# Patient Record
Sex: Female | Born: 1935 | Race: White | Hispanic: No | Smoking: Former smoker
Health system: Southern US, Community
[De-identification: ages and names within clinical notes are randomized; demographics above are authoritative.]

## PROBLEM LIST (undated history)

## (undated) DIAGNOSIS — I10 Essential (primary) hypertension: Secondary | ICD-10-CM

## (undated) DIAGNOSIS — M810 Age-related osteoporosis without current pathological fracture: Secondary | ICD-10-CM

## (undated) DIAGNOSIS — I639 Cerebral infarction, unspecified: Secondary | ICD-10-CM

## (undated) DIAGNOSIS — E7800 Pure hypercholesterolemia, unspecified: Secondary | ICD-10-CM

## (undated) DIAGNOSIS — M797 Fibromyalgia: Secondary | ICD-10-CM

## (undated) DIAGNOSIS — C44722 Squamous cell carcinoma of skin of right lower limb, including hip: Secondary | ICD-10-CM

## (undated) DIAGNOSIS — K219 Gastro-esophageal reflux disease without esophagitis: Secondary | ICD-10-CM

## (undated) DIAGNOSIS — Z9289 Personal history of other medical treatment: Secondary | ICD-10-CM

## (undated) DIAGNOSIS — I209 Angina pectoris, unspecified: Secondary | ICD-10-CM

## (undated) DIAGNOSIS — W109XXA Fall (on) (from) unspecified stairs and steps, initial encounter: Secondary | ICD-10-CM

## (undated) DIAGNOSIS — Z9889 Other specified postprocedural states: Secondary | ICD-10-CM

## (undated) DIAGNOSIS — M545 Low back pain, unspecified: Secondary | ICD-10-CM

## (undated) DIAGNOSIS — H353290 Exudative age-related macular degeneration, unspecified eye, stage unspecified: Secondary | ICD-10-CM

## (undated) DIAGNOSIS — I4891 Unspecified atrial fibrillation: Secondary | ICD-10-CM

## (undated) DIAGNOSIS — Z8719 Personal history of other diseases of the digestive system: Secondary | ICD-10-CM

## (undated) DIAGNOSIS — M1990 Unspecified osteoarthritis, unspecified site: Secondary | ICD-10-CM

## (undated) DIAGNOSIS — T4145XA Adverse effect of unspecified anesthetic, initial encounter: Secondary | ICD-10-CM

## (undated) DIAGNOSIS — I739 Peripheral vascular disease, unspecified: Secondary | ICD-10-CM

## (undated) DIAGNOSIS — K922 Gastrointestinal hemorrhage, unspecified: Secondary | ICD-10-CM

## (undated) DIAGNOSIS — G8929 Other chronic pain: Secondary | ICD-10-CM

## (undated) DIAGNOSIS — T8859XA Other complications of anesthesia, initial encounter: Secondary | ICD-10-CM

## (undated) DIAGNOSIS — D649 Anemia, unspecified: Secondary | ICD-10-CM

## (undated) DIAGNOSIS — I82409 Acute embolism and thrombosis of unspecified deep veins of unspecified lower extremity: Secondary | ICD-10-CM

## (undated) DIAGNOSIS — R112 Nausea with vomiting, unspecified: Secondary | ICD-10-CM

## (undated) DIAGNOSIS — G459 Transient cerebral ischemic attack, unspecified: Secondary | ICD-10-CM

## (undated) HISTORY — PX: CARPAL TUNNEL RELEASE: SHX101

## (undated) HISTORY — PX: TUBAL LIGATION: SHX77

## (undated) HISTORY — DX: Peripheral vascular disease, unspecified: I73.9

## (undated) HISTORY — DX: Acute embolism and thrombosis of unspecified deep veins of unspecified lower extremity: I82.409

## (undated) HISTORY — PX: COLONOSCOPY: SHX174

## (undated) HISTORY — PX: ORIF SHOULDER FRACTURE: SHX5035

## (undated) HISTORY — DX: Essential (primary) hypertension: I10

## (undated) HISTORY — PX: JOINT REPLACEMENT: SHX530

## (undated) HISTORY — DX: Cerebral infarction, unspecified: I63.9

## (undated) HISTORY — PX: VAGINAL HYSTERECTOMY: SUR661

## (undated) HISTORY — PX: EYE SURGERY: SHX253

## (undated) HISTORY — PX: CATARACT EXTRACTION W/ INTRAOCULAR LENS  IMPLANT, BILATERAL: SHX1307

## (undated) HISTORY — PX: DILATION AND CURETTAGE OF UTERUS: SHX78

---

## 1999-01-22 ENCOUNTER — Encounter: Payer: Self-pay | Admitting: Internal Medicine

## 1999-01-22 ENCOUNTER — Ambulatory Visit (HOSPITAL_COMMUNITY): Admission: RE | Admit: 1999-01-22 | Discharge: 1999-01-22 | Payer: Self-pay | Admitting: Internal Medicine

## 1999-08-07 ENCOUNTER — Emergency Department (HOSPITAL_COMMUNITY): Admission: EM | Admit: 1999-08-07 | Discharge: 1999-08-07 | Payer: Self-pay | Admitting: Emergency Medicine

## 2000-04-09 ENCOUNTER — Emergency Department (HOSPITAL_COMMUNITY): Admission: EM | Admit: 2000-04-09 | Discharge: 2000-04-09 | Payer: Self-pay | Admitting: Emergency Medicine

## 2000-04-09 ENCOUNTER — Encounter: Payer: Self-pay | Admitting: Emergency Medicine

## 2000-05-05 ENCOUNTER — Encounter: Admission: RE | Admit: 2000-05-05 | Discharge: 2000-06-02 | Payer: Self-pay | Admitting: Orthopedic Surgery

## 2000-10-05 ENCOUNTER — Ambulatory Visit (HOSPITAL_COMMUNITY): Admission: RE | Admit: 2000-10-05 | Discharge: 2000-10-05 | Payer: Self-pay | Admitting: Gastroenterology

## 2000-10-05 ENCOUNTER — Encounter (INDEPENDENT_AMBULATORY_CARE_PROVIDER_SITE_OTHER): Payer: Self-pay

## 2002-08-31 ENCOUNTER — Encounter
Admission: RE | Admit: 2002-08-31 | Discharge: 2002-10-10 | Payer: Self-pay | Admitting: Physical Medicine and Rehabilitation

## 2002-10-14 ENCOUNTER — Encounter: Payer: Self-pay | Admitting: Internal Medicine

## 2002-10-14 ENCOUNTER — Encounter: Admission: RE | Admit: 2002-10-14 | Discharge: 2002-10-14 | Payer: Self-pay | Admitting: Internal Medicine

## 2003-04-10 ENCOUNTER — Inpatient Hospital Stay (HOSPITAL_COMMUNITY): Admission: EM | Admit: 2003-04-10 | Discharge: 2003-04-21 | Payer: Self-pay | Admitting: *Deleted

## 2003-04-10 ENCOUNTER — Encounter: Payer: Self-pay | Admitting: *Deleted

## 2003-04-13 ENCOUNTER — Encounter: Payer: Self-pay | Admitting: Orthopedic Surgery

## 2003-08-07 ENCOUNTER — Other Ambulatory Visit: Admission: RE | Admit: 2003-08-07 | Discharge: 2003-08-07 | Payer: Self-pay | Admitting: Obstetrics and Gynecology

## 2003-08-31 ENCOUNTER — Encounter: Payer: Self-pay | Admitting: *Deleted

## 2003-08-31 ENCOUNTER — Encounter: Admission: RE | Admit: 2003-08-31 | Discharge: 2003-08-31 | Payer: Self-pay | Admitting: *Deleted

## 2004-06-19 ENCOUNTER — Encounter: Admission: RE | Admit: 2004-06-19 | Discharge: 2004-06-19 | Payer: Self-pay | Admitting: Orthopedic Surgery

## 2004-06-20 ENCOUNTER — Ambulatory Visit (HOSPITAL_COMMUNITY): Admission: RE | Admit: 2004-06-20 | Discharge: 2004-06-20 | Payer: Self-pay | Admitting: Orthopedic Surgery

## 2004-06-20 ENCOUNTER — Ambulatory Visit (HOSPITAL_BASED_OUTPATIENT_CLINIC_OR_DEPARTMENT_OTHER): Admission: RE | Admit: 2004-06-20 | Discharge: 2004-06-20 | Payer: Self-pay | Admitting: Orthopedic Surgery

## 2004-12-31 ENCOUNTER — Other Ambulatory Visit: Admission: RE | Admit: 2004-12-31 | Discharge: 2004-12-31 | Payer: Self-pay | Admitting: Obstetrics and Gynecology

## 2006-06-17 HISTORY — PX: TOTAL KNEE ARTHROPLASTY: SHX125

## 2006-07-10 ENCOUNTER — Inpatient Hospital Stay (HOSPITAL_COMMUNITY): Admission: RE | Admit: 2006-07-10 | Discharge: 2006-07-14 | Payer: Self-pay | Admitting: Specialist

## 2007-12-03 ENCOUNTER — Encounter: Admission: RE | Admit: 2007-12-03 | Discharge: 2007-12-03 | Payer: Self-pay | Admitting: Internal Medicine

## 2008-01-21 ENCOUNTER — Encounter: Admission: RE | Admit: 2008-01-21 | Discharge: 2008-01-21 | Payer: Self-pay | Admitting: Gastroenterology

## 2008-06-24 ENCOUNTER — Emergency Department (HOSPITAL_COMMUNITY): Admission: EM | Admit: 2008-06-24 | Discharge: 2008-06-24 | Payer: Self-pay | Admitting: Emergency Medicine

## 2009-02-11 ENCOUNTER — Encounter (INDEPENDENT_AMBULATORY_CARE_PROVIDER_SITE_OTHER): Payer: Self-pay | Admitting: Internal Medicine

## 2009-02-11 ENCOUNTER — Inpatient Hospital Stay (HOSPITAL_COMMUNITY): Admission: EM | Admit: 2009-02-11 | Discharge: 2009-02-13 | Payer: Self-pay | Admitting: Emergency Medicine

## 2009-02-12 ENCOUNTER — Encounter (INDEPENDENT_AMBULATORY_CARE_PROVIDER_SITE_OTHER): Payer: Self-pay | Admitting: Internal Medicine

## 2009-02-12 ENCOUNTER — Ambulatory Visit: Payer: Self-pay | Admitting: Surgery

## 2009-09-06 ENCOUNTER — Inpatient Hospital Stay (HOSPITAL_COMMUNITY): Admission: EM | Admit: 2009-09-06 | Discharge: 2009-09-18 | Payer: Self-pay | Admitting: Emergency Medicine

## 2009-09-06 ENCOUNTER — Encounter: Admission: RE | Admit: 2009-09-06 | Discharge: 2009-09-06 | Payer: Self-pay | Admitting: Internal Medicine

## 2009-09-07 ENCOUNTER — Ambulatory Visit: Payer: Self-pay | Admitting: Surgery

## 2009-09-07 ENCOUNTER — Encounter (INDEPENDENT_AMBULATORY_CARE_PROVIDER_SITE_OTHER): Payer: Self-pay | Admitting: Internal Medicine

## 2009-09-10 ENCOUNTER — Ambulatory Visit: Payer: Self-pay | Admitting: Surgery

## 2009-09-10 ENCOUNTER — Encounter: Payer: Self-pay | Admitting: Surgery

## 2009-09-13 HISTORY — PX: PR VEIN BYPASS GRAFT,AORTO-FEM-POP: 35551

## 2009-10-01 ENCOUNTER — Ambulatory Visit: Payer: Self-pay | Admitting: Surgery

## 2009-10-15 ENCOUNTER — Ambulatory Visit: Payer: Self-pay | Admitting: Surgery

## 2009-10-17 DIAGNOSIS — I82409 Acute embolism and thrombosis of unspecified deep veins of unspecified lower extremity: Secondary | ICD-10-CM

## 2009-10-17 HISTORY — DX: Acute embolism and thrombosis of unspecified deep veins of unspecified lower extremity: I82.409

## 2009-10-24 ENCOUNTER — Ambulatory Visit: Payer: Self-pay | Admitting: Vascular Surgery

## 2009-10-25 ENCOUNTER — Inpatient Hospital Stay (HOSPITAL_COMMUNITY): Admission: RE | Admit: 2009-10-25 | Discharge: 2009-10-30 | Payer: Self-pay | Admitting: Vascular Surgery

## 2009-10-25 ENCOUNTER — Ambulatory Visit: Payer: Self-pay | Admitting: Vascular Surgery

## 2009-10-25 HISTORY — PX: INCISION AND DRAINAGE OF WOUND: SHX1803

## 2009-11-15 ENCOUNTER — Ambulatory Visit: Payer: Self-pay | Admitting: Vascular Surgery

## 2009-11-26 ENCOUNTER — Ambulatory Visit: Payer: Self-pay | Admitting: Surgery

## 2009-12-17 ENCOUNTER — Ambulatory Visit: Payer: Self-pay | Admitting: Surgery

## 2009-12-25 ENCOUNTER — Ambulatory Visit: Payer: Self-pay | Admitting: Vascular Surgery

## 2010-01-07 ENCOUNTER — Ambulatory Visit: Payer: Self-pay | Admitting: Surgery

## 2010-02-04 ENCOUNTER — Ambulatory Visit: Payer: Self-pay | Admitting: Surgery

## 2010-02-18 ENCOUNTER — Ambulatory Visit: Payer: Self-pay | Admitting: Surgery

## 2010-04-18 ENCOUNTER — Ambulatory Visit: Payer: Self-pay | Admitting: Vascular Surgery

## 2010-04-19 ENCOUNTER — Ambulatory Visit: Payer: Self-pay | Admitting: Vascular Surgery

## 2010-04-19 ENCOUNTER — Ambulatory Visit (HOSPITAL_COMMUNITY): Admission: RE | Admit: 2010-04-19 | Discharge: 2010-04-19 | Payer: Self-pay | Admitting: Vascular Surgery

## 2010-04-25 ENCOUNTER — Ambulatory Visit (HOSPITAL_COMMUNITY): Admission: RE | Admit: 2010-04-25 | Discharge: 2010-04-25 | Payer: Self-pay | Admitting: Surgery

## 2010-05-27 ENCOUNTER — Ambulatory Visit: Payer: Self-pay | Admitting: Surgery

## 2010-09-02 ENCOUNTER — Ambulatory Visit: Payer: Self-pay | Admitting: Surgery

## 2010-10-18 ENCOUNTER — Emergency Department (HOSPITAL_BASED_OUTPATIENT_CLINIC_OR_DEPARTMENT_OTHER)
Admission: EM | Admit: 2010-10-18 | Discharge: 2010-10-18 | Payer: Self-pay | Source: Home / Self Care | Admitting: Emergency Medicine

## 2010-11-25 ENCOUNTER — Ambulatory Visit
Admission: RE | Admit: 2010-11-25 | Discharge: 2010-11-25 | Payer: Self-pay | Source: Home / Self Care | Attending: Surgery | Admitting: Surgery

## 2010-12-02 ENCOUNTER — Ambulatory Visit: Admit: 2010-12-02 | Payer: Self-pay | Admitting: Surgery

## 2010-12-08 ENCOUNTER — Encounter: Payer: Self-pay | Admitting: Gastroenterology

## 2010-12-08 ENCOUNTER — Encounter: Payer: Self-pay | Admitting: Sports Medicine

## 2010-12-11 ENCOUNTER — Ambulatory Visit (HOSPITAL_COMMUNITY)
Admission: RE | Admit: 2010-12-11 | Discharge: 2010-12-11 | Payer: Self-pay | Source: Home / Self Care | Attending: Surgery | Admitting: Surgery

## 2010-12-11 HISTORY — PX: FEMORAL ARTERY STENT: SHX1583

## 2010-12-12 LAB — POCT I-STAT, CHEM 8
Calcium, Ion: 1.24 mmol/L (ref 1.12–1.32)
HCT: 38 % (ref 36.0–46.0)

## 2010-12-12 LAB — POCT ACTIVATED CLOTTING TIME
Activated Clotting Time: 176 seconds
Activated Clotting Time: 146 seconds

## 2010-12-29 NOTE — Op Note (Addendum)
NAME:  [name], [name]              ACCOUNT NO.:  [id number]  MEDICAL RECORD NO.:  [id number]          PATIENT TYPE:  AMB  LOCATION:  SDS                          FACILITY:  [facility]  PHYSICIAN:  VDurene Cal IV, MDDATE OF BIRTH:  1928-05-29  DATE OF PROCEDURE:  12/11/2010 DATE OF DISCHARGE:  12/11/2010                              OPERATIVE REPORT   PREOPERATIVE DIAGNOSIS:  In-stent stenosis.  POSTOPERATIVE DIAGNOSIS:  In-stent stenosis.  PROCEDURE PERFORMED: 1. Ultrasound access right femoral artery. 2. Left leg runoff. 3. Stent in left superficial femoral artery.  INDICATIONS:  Ms. Flippo is an 77 year old female who underwent emergent bypass for ischemic left leg.  She developed stenosis at her proximal anastomosis which was the above-knee popliteal artery.  This was stented.  She developed recurrent symptoms and ultrasound velocities consistent with high-grade stenosis.  PROCEDURE:  The patient was identified in the holding area and taken to room 8, placed supine on table.  Right groin was prepped and draped in usual fashion.  A time-out was called.  The right femoral artery was divided with ultrasound and found to be widely patent, easily compressible.  A digital image was acquired, it was accessed under ultrasound guidance after anesthetizing the with 1% lidocaine.  A 0.035 wire was advanced into the aorta under fluoroscopic visualization.  A 5- French sheath was placed.  An Omni flush catheter was then inserted, and the aortic bifurcation was crossed using the Omni flush catheter.  A Bentson wire was placed in the left external iliac artery and left leg runoff was obtained.  FINDINGS:  The left common femoral artery is widely patent.  The left profunda femoral artery is widely patent.  The left superficial femoral artery is patent.  In its proximal portion, there is an area of stenosis of approximately 40% to 50%.  There was a second lesion approximately 20 cm  distal to its origin that is about 30% to 40% stenosed.  There is a high-grade stenosis within the proximal portion of the popliteal stent. The bypass graft is widely patent with three-vessel runoff.  At this point in time, decision was made to intervene.  Over a Versacore wire, the Omni flush catheter was removed and a 6-French Ansel-1 sheath was inserted.  The patient was given systemic heparinization.  The Versacore wire was advanced out resistance across area of stenosis.  I elected to primarily treat this lesion with a stent.  An EV3 6 x 30 self- expanding stent was deployed, covering the in-stent stenosis which extended up to the proximal portion of the stent.  This area was then molded to confirmation with a 5-mm balloon.  Completion arteriogram was performed which revealed resolution of the in-stent stenosis with widely patent bypass and runoff.  At this point in time, decision was made to terminate the procedure.  Catheters and wires were removed.  The patient was taken to the holding area for sheath pull.  The patient tolerated the procedure well.  IMPRESSION: 1. High-grade in-stent stenosis, left popliteal artery successfully     treated using an EV3 6 x 30 balloon expandable stent. 2. Widely patent above  to below-knee popliteal bypass graft with three-     vessel runoff graft.     Jorge Ny, MD     VWB/MEDQ  D:  12/11/2010  T:  12/12/2010  Job:  [id number]  Electronically Signed by Arelia Longest IV MD on 12/29/2010 09:58:41 PM

## 2011-01-13 ENCOUNTER — Ambulatory Visit (INDEPENDENT_AMBULATORY_CARE_PROVIDER_SITE_OTHER): Payer: Medicare Other | Admitting: Surgery

## 2011-01-13 ENCOUNTER — Encounter (INDEPENDENT_AMBULATORY_CARE_PROVIDER_SITE_OTHER): Payer: Medicare Other

## 2011-01-13 DIAGNOSIS — I70219 Atherosclerosis of native arteries of extremities with intermittent claudication, unspecified extremity: Secondary | ICD-10-CM

## 2011-01-13 DIAGNOSIS — Z48812 Encounter for surgical aftercare following surgery on the circulatory system: Secondary | ICD-10-CM

## 2011-01-13 DIAGNOSIS — I739 Peripheral vascular disease, unspecified: Secondary | ICD-10-CM

## 2011-01-14 NOTE — Assessment & Plan Note (Signed)
OFFICE VISIT  Doddridge, Makendra L DOB:  [birth year]                                       01/13/2011 ZOXWR#:[id number]  The patient is status post left superficial femoral to below knee popliteal bypass graft with vein on 09/13/2009 for an ischemic leg.  She had a nonhealing wound in her distal incision which was debrided by Dr. Darrick Penna on 10/25/2009.  She had wound healing complications secondary to venous hypertension and the edema in her leg.  It did improve with an Radio broadcast assistant.  She developed a high-grade stenosis in the popliteal artery above the knee at the proximal anastomosis.  This was stented in 2011. Surveillance ultrasound detected an elevated velocity, as well as a drop in her ABI from 1 to 0.8.  She was asymptomatic at that time.  She underwent arteriogram on 12/11/2010.  High-grade in-stent stenosis was detected and restented using a 6 x 30 EV3 stent.  She comes back in today for followup.  She is without complaints.  PHYSICAL EXAMINATION:  Vital signs:  Heart rate 62, blood pressure 162/81, O2 sat 99%.  General:  She is well-appearing, in no distress. Extremities:  Warm and well-perfused.  There was minimal edema bilaterally.  She has a palpable pedal pulse.  DIAGNOSTIC STUDIES:  Ultrasound was performed today.  Her ABIs are 1.0 bilaterally.  ASSESSMENT AND PLAN:  Status post restenting of the proximal anastomosis of her above knee to below knee bypass on the left.  The patient is doing very well at this time.  I have refilled her compression stockings.  This is a 15-20 grade compression.  I gave her thigh high prescription today because she was having some problem with swelling above at the top of her knee high socks.  She can be placed back on our surveillance ultrasound.  She will have ultrasounds every 3 months for the first year.  I will plan on seeing her back in 1 year as long as her ultrasounds are without abnormalities.    Jorge Ny, MD Electronically Signed  VWB/MEDQ  D:  01/13/2011  T:  01/14/2011  Job:  [id number]  cc:   Candyce Churn, M.D.

## 2011-02-03 LAB — POCT I-STAT, CHEM 8
BUN: 23 mg/dL (ref 6–23)
Chloride: 106 mEq/L (ref 96–112)
Chloride: 109 mEq/L (ref 96–112)
Creatinine, Ser: 1.1 mg/dL (ref 0.4–1.2)
Creatinine, Ser: 1.3 mg/dL — ABNORMAL HIGH (ref 0.4–1.2)
Glucose, Bld: 92 mg/dL (ref 70–99)
HCT: 39 % (ref 36.0–46.0)
HCT: 39 % (ref 36.0–46.0)
Hemoglobin: 13.3 g/dL (ref 12.0–15.0)
Potassium: 3.8 mEq/L (ref 3.5–5.1)
Sodium: 139 mEq/L (ref 135–145)
TCO2: 22 mmol/L (ref 0–100)

## 2011-02-18 LAB — BASIC METABOLIC PANEL
BUN: 13 mg/dL (ref 6–23)
CO2: 28 mEq/L (ref 19–32)
Chloride: 104 mEq/L (ref 96–112)
Creatinine, Ser: 1.25 mg/dL — ABNORMAL HIGH (ref 0.4–1.2)
GFR calc Af Amer: 50 mL/min — ABNORMAL LOW (ref >60)
GFR calc non Af Amer: 53 mL/min — ABNORMAL LOW (ref >60)
Potassium: 3.9 mEq/L (ref 3.5–5.1)

## 2011-02-18 LAB — WOUND CULTURE

## 2011-02-18 LAB — POCT I-STAT 4, (NA,K, GLUC, HGB,HCT)
Glucose, Bld: 107 mg/dL — ABNORMAL HIGH (ref 70–99)
HCT: 35 % — ABNORMAL LOW (ref 36.0–46.0)
Hemoglobin: 11.9 g/dL — ABNORMAL LOW (ref 12.0–15.0)
Potassium: 4 mEq/L (ref 3.5–5.1)

## 2011-02-18 LAB — CBC
Hemoglobin: 11 g/dL — ABNORMAL LOW (ref 12.0–15.0)
MCV: 92.5 fL (ref 78.0–100.0)

## 2011-02-20 LAB — BASIC METABOLIC PANEL
BUN: 16 mg/dL (ref 6–23)
CO2: 24 mEq/L (ref 19–32)
CO2: 24 mEq/L (ref 19–32)
Calcium: 8.7 mg/dL (ref 8.4–10.5)
Chloride: 103 mEq/L (ref 96–112)
Chloride: 103 mEq/L (ref 96–112)
Creatinine, Ser: 1.37 mg/dL — ABNORMAL HIGH (ref 0.4–1.2)
GFR calc Af Amer: 43 mL/min — ABNORMAL LOW (ref >60)
GFR calc Af Amer: 45 mL/min — ABNORMAL LOW (ref >60)
GFR calc Af Amer: 50 mL/min — ABNORMAL LOW (ref >60)
GFR calc Af Amer: 54 mL/min — ABNORMAL LOW (ref >60)
GFR calc non Af Amer: 36 mL/min — ABNORMAL LOW (ref >60)
GFR calc non Af Amer: 44 mL/min — ABNORMAL LOW (ref >60)
Glucose, Bld: 92 mg/dL (ref 70–99)
Potassium: 4 mEq/L (ref 3.5–5.1)
Potassium: 4.5 mEq/L (ref 3.5–5.1)
Sodium: 134 mEq/L — ABNORMAL LOW (ref 135–145)

## 2011-02-20 LAB — CROSSMATCH

## 2011-02-20 LAB — CBC
HCT: 24.4 % — ABNORMAL LOW (ref 36.0–46.0)
HCT: 26.4 % — ABNORMAL LOW (ref 36.0–46.0)
HCT: 31.5 % — ABNORMAL LOW (ref 36.0–46.0)
Hemoglobin: 10 g/dL — ABNORMAL LOW (ref 12.0–15.0)
Hemoglobin: 10.8 g/dL — ABNORMAL LOW (ref 12.0–15.0)
Hemoglobin: 9 g/dL — ABNORMAL LOW (ref 12.0–15.0)
MCHC: 34.4 g/dL (ref 30.0–36.0)
MCHC: 35.4 g/dL (ref 30.0–36.0)
MCV: 90.1 fL (ref 78.0–100.0)
MCV: 90.7 fL (ref 78.0–100.0)
MCV: 90.7 fL (ref 78.0–100.0)
MCV: 91.6 fL (ref 78.0–100.0)
Platelets: 186 10*3/uL (ref 150–400)
RBC: 2.89 MIL/uL — ABNORMAL LOW (ref 3.87–5.11)
RBC: 3.15 MIL/uL — ABNORMAL LOW (ref 3.87–5.11)
RBC: 3.46 MIL/uL — ABNORMAL LOW (ref 3.87–5.11)
RDW: 13.7 % (ref 11.5–15.5)
RDW: 14.2 % (ref 11.5–15.5)
WBC: 6.9 10*3/uL (ref 4.0–10.5)
WBC: 8.7 10*3/uL (ref 4.0–10.5)

## 2011-02-20 LAB — PROTIME-INR
INR: 0.96 (ref 0.00–1.49)
INR: 0.97 (ref 0.00–1.49)

## 2011-02-20 LAB — POCT CARDIAC MARKERS
CKMB, poc: 3 ng/mL (ref 1.0–8.0)
CKMB, poc: 3.6 ng/mL (ref 1.0–8.0)
Troponin i, poc: <0.05 ng/mL (ref 0.00–0.09)

## 2011-02-20 LAB — DIFFERENTIAL
Basophils Relative: 1 % (ref 0–1)
Lymphocytes Relative: 27 % (ref 12–46)
Lymphs Abs: 1.8 10*3/uL (ref 0.7–4.0)
Monocytes Absolute: 0.6 10*3/uL (ref 0.1–1.0)
Monocytes Relative: 10 % (ref 3–12)
Neutro Abs: 3.9 10*3/uL (ref 1.7–7.7)
Neutrophils Relative %: 60 % (ref 43–77)

## 2011-02-20 LAB — TROPONIN I: Troponin I: 0.01 ng/mL (ref 0.00–0.06)

## 2011-02-20 LAB — URINALYSIS, ROUTINE W REFLEX MICROSCOPIC
Bilirubin Urine: NEGATIVE
Glucose, UA: NEGATIVE mg/dL
Ketones, ur: NEGATIVE mg/dL
Protein, ur: NEGATIVE mg/dL
Specific Gravity, Urine: 1.012 (ref 1.005–1.030)
Urobilinogen, UA: 0.2 mg/dL (ref 0.0–1.0)
pH: 5.5 (ref 5.0–8.0)

## 2011-02-20 LAB — URINE MICROSCOPIC-ADD ON

## 2011-02-20 LAB — CK TOTAL AND CKMB (NOT AT ARMC)
CK, MB: 2.7 ng/mL (ref 0.3–4.0)
CK, MB: 2.9 ng/mL (ref 0.3–4.0)
CK, MB: 3.5 ng/mL (ref 0.3–4.0)
Relative Index: 2 (ref 0.0–2.5)
Relative Index: 2.4 (ref 0.0–2.5)

## 2011-02-20 LAB — APTT: aPTT: 26 seconds (ref 24–37)

## 2011-02-27 LAB — COMPREHENSIVE METABOLIC PANEL
ALT: 12 U/L (ref 0–35)
BUN: 22 mg/dL (ref 6–23)
CO2: 24 mEq/L (ref 19–32)
CO2: 26 mEq/L (ref 19–32)
Calcium: 9.1 mg/dL (ref 8.4–10.5)
Calcium: 9.2 mg/dL (ref 8.4–10.5)
Chloride: 106 mEq/L (ref 96–112)
Creatinine, Ser: 1.21 mg/dL — ABNORMAL HIGH (ref 0.4–1.2)
Creatinine, Ser: 1.26 mg/dL — ABNORMAL HIGH (ref 0.4–1.2)
GFR calc non Af Amer: 41 mL/min — ABNORMAL LOW (ref >60)
GFR calc non Af Amer: 43 mL/min — ABNORMAL LOW (ref >60)
Glucose, Bld: 85 mg/dL (ref 70–99)
Glucose, Bld: 96 mg/dL (ref 70–99)
Sodium: 139 mEq/L (ref 135–145)
Total Bilirubin: 0.7 mg/dL (ref 0.3–1.2)

## 2011-02-27 LAB — DIFFERENTIAL
Lymphocytes Relative: 28 % (ref 12–46)
Lymphs Abs: 2.4 10*3/uL (ref 0.7–4.0)
Neutrophils Relative %: 59 % (ref 43–77)

## 2011-02-27 LAB — CBC
Hemoglobin: 12.3 g/dL (ref 12.0–15.0)
MCHC: 34.1 g/dL (ref 30.0–36.0)
MCHC: 34.3 g/dL (ref 30.0–36.0)
MCV: 90.2 fL (ref 78.0–100.0)
MCV: 90.3 fL (ref 78.0–100.0)
Platelets: 211 10*3/uL (ref 150–400)
RBC: 4 MIL/uL (ref 3.87–5.11)
WBC: 8.5 10*3/uL (ref 4.0–10.5)

## 2011-02-27 LAB — GLUCOSE, CAPILLARY: Glucose-Capillary: 100 mg/dL — ABNORMAL HIGH (ref 70–99)

## 2011-02-27 LAB — TROPONIN I: Troponin I: <0.01 ng/mL (ref 0.00–0.06)

## 2011-02-27 LAB — HEMOGLOBIN A1C: Hgb A1c MFr Bld: 5.6 % (ref 4.6–6.1)

## 2011-02-27 LAB — LIPID PANEL
Cholesterol: 183 mg/dL (ref 0–200)
Total CHOL/HDL Ratio: 6.5 RATIO

## 2011-02-27 LAB — CK TOTAL AND CKMB (NOT AT ARMC): Total CK: 182 U/L — ABNORMAL HIGH (ref 7–177)

## 2011-02-27 LAB — PROTIME-INR: Prothrombin Time: 13.4 seconds (ref 11.6–15.2)

## 2011-04-01 NOTE — Procedures (Signed)
BYPASS GRAFT EVALUATION   INDICATION:  Follow up left lower extremity stent and bypass graft.   HISTORY:  Diabetes:  No.  Cardiac:  No.  Hypertension:  Yes.  Smoking:  Previous.  Previous Surgery:  Left distal superficial femoral to below-knee  popliteal artery bypass graft on 09/13/2009, left superficial femoral  artery stent on 04/25/2010.   SINGLE LEVEL ARTERIAL EXAM                               RIGHT              LEFT  Brachial:                    169                174  Anterior tibial:             184                172  Posterior tibial:            163                175  Peroneal:  Ankle/brachial index:        1.06               1.01   PREVIOUS ABI:  Date: 05/27/2010  RIGHT:  0.98  LEFT:  1.12   LOWER EXTREMITY BYPASS GRAFT DUPLEX EXAM:   DUPLEX:  Biphasic Doppler waveforms noted throughout the left lower  extremity bypass graft in its native vessels with no increase in  velocities.   IMPRESSION:  1. Patent left superficial femoral artery stent and superficial      femoral to popliteal artery bypass graft with no evidence of      stenosis.  2. Stable bilateral ankle brachial indices.   ___________________________________________  V. Charlena Cross, MD   CH/MEDQ  D:  09/03/2010  T:  09/03/2010  Job:  [id number]

## 2011-04-01 NOTE — Assessment & Plan Note (Signed)
OFFICE VISIT   [name], [name]  DOB:  [birth year]                                       02/18/2010  JXBJY#:[id number]   REASON FOR VISIT:  Follow-up.   HISTORY:  This is a 77 year old female who underwent left superficial  femoral to below knee popliteal bypass graft with reversed greater  saphenous vein on September 13, 2009 for an ischemic leg.  She developed a  nonhealing wound at her distal incision which was debrided in the  operating by Dr. Darrick Penna on December 9.  We have been dealing with  getting this area to heal since that time, the patient has significant  venous hypertension and that, I believe, is the etiology to her slowly  healing wounds.  When I last saw her in March I put her in Unna boots  and she comes back in today for follow-up.   PHYSICAL EXAMINATION:  She is afebrile and vital signs are stable.  Cardiovascular:  Regular rate and rhythm.  Respirations are nonlabored.  The Unna boot was removed today and the wound is completely healed.  She  has palpable pedal pulse.   PLAN:  Now that the wound is healed, I am recommending that she go into  compression stockings to prevent further wound issues in both lower  extremities.  I am going to have her come back for ultrasound in August  and I will see her at that time.     Jorge Ny, MD  Electronically Signed   VWB/MEDQ  D:  02/18/2010  T:  02/19/2010  Job:  [id number]   cc:   Candyce Churn, M.D.

## 2011-04-01 NOTE — Assessment & Plan Note (Signed)
OFFICE VISIT   [name], [name]  DOB:  November 23, 1927                                       11/25/2010  VHQIO#:[id number]   The patient comes back today for followup.  She underwent left  superficial femoral to below knee popliteal bypass graft with vein on  09/13/2009 for an ischemic leg.  She developed a nonhealing wound in her  distal incision which was debrided by Dr. Darrick Penna on December 9.  She had  wound healing problems secondary to venous hypertension as well as  edema.  It did get better with an Radio broadcast assistant.  We identified a high-grade  stenosis in the popliteal artery above the knee at the proximal  anastomosis.  This was further evaluated and stented by myself in mid  2011.  She comes back in today for surveillance ultrasound which showed  elevated velocities of 502 cm/sec at the origin of the SFA stent.  Her  ABI has dropped from 1.0 to 0.8.  She is asymptomatic.   With velocity elevations this high I think this needs to be intervened  on in order to prevent bypass graft failure.  I initially performed  angioplasty of the bypass graft which resulted in a dissection which  required stenting.  I used a 6 x 40 Cordis self extending stent.  I have  spoken with the daughter via speaker phone.  We are set to proceed with  angiogram and intervention of her left leg on Wednesday January 25.     Jorge Ny, MD  Electronically Signed   VWB/MEDQ  D:  11/25/2010  T:  11/26/2010  Job:  [id number]   cc:   Candyce Churn, M.D.

## 2011-04-01 NOTE — Assessment & Plan Note (Signed)
OFFICE VISIT   [name], [name]  DOB:  1928/01/21                                       05/27/2010  ZOXWR#:[id number]   Patient comes back today for follow-up.  She is status post left  superficial femoral to below knee popliteal bypass graft with greater  saphenous vein on 09/13/2009 for an ischemic leg.  She developed a  nonhealing wound in her distal incision, which was debrided by Dr.  Darrick Penna on December 9th.  Wound healing was a problem due to her venous  hypertension and edema.  She was placed in an [employer] in March and  ultimately healed her wound.  She was placed in compression stockings.  She developed recurrence of her symptoms in her leg and was taken for an  arteriogram by Dr. Darrick Penna on June 3.  He identified a high-grade  stenosis and a popliteal artery above the knee, most likely at the  proximal anastomosis.  Approximately 1 week later, I proceeded with  percutaneous stenting of the lesion.  The patient did well and was  discharged home.  She comes back today saying that her symptoms are  resolved.   PHYSICAL EXAMINATION:  Heart rate 73, blood pressure 128/72, respiratory  rate 20.  She is well-appearing in no distress.  Extremities reveal mild  edema.  No ulceration.  Cardiovascular:  She has a palpable dorsalis  pedis pulse.   DIAGNOSTICS:  I have ordered and independently reviewed her ultrasound  that shows an ABI of 0.98 on the right and 1.1 on the left.   ASSESSMENT:  Status post stenting of the left leg bypass.   PLAN:  The patient will be placed back on our bypass surveillance  protocol.  The next scan will be in 3 months.  I will plan on seeing her  back in 6 months.  She is going to get back into her compression  stockings as well.     Jorge Ny, MD  Electronically Signed   VWB/MEDQ  D:  05/27/2010  T:  05/28/2010  Job:  [id number]   cc:   Candyce Churn, M.D.

## 2011-04-01 NOTE — Assessment & Plan Note (Signed)
OFFICE VISIT   [name], [name]  DOB:  [birth year]                                       10/24/2009  WJXBJ#:[id number]   The patient returns for followup today after her recent left above knee  to below knee popliteal incision Dr. Myra Gianotti.  She has had further  deterioration and breakdown of her below knee incision.  The anterior  portion of the wound is granulating somewhat.  The posterior portion of  the incision and skin edge though has a large amount of necrotic debris.  Some of this was debrided sharply at the bedside today.  There was some  bleeding tissue from this but there was still further necrotic tissue.  I believe this needs further debridement in the OR.  A history and  physical was dictated on the [facility] system.  She will have excisional  debridement of this in the operating room tomorrow 10/25/2009.     Janetta Hora. Fields, MD  Electronically Signed   CEF/MEDQ  D:  10/24/2009  T:  10/25/2009  Job:  [id number]

## 2011-04-01 NOTE — Assessment & Plan Note (Signed)
OFFICE VISIT   [name], [name]  DOB:  11-Oct-1928                                       12/17/2009  EAVWU#:[id number]   She comes back today for follow up.  She has a history of undergoing SFA  to below knee popliteal bypass graft with vein on September 13, 2009, for  ischemic rest pain.  She had to go back for I and D of her wound on  December 9.  Dr. Darrick Penna did this procedure.  She was on a wound VAC  until December 30 and has been doing dressing changes since that time.   On examination, her wound is nearly healed.  I did cauterize, with  silver nitrate, an area of approximately 1 x 1 cm which is very  superficial.   She had vein bypass graft surveillance ultrasound today which shows  patent grafts.  Her ankle brachial indices were not performed today  secondary to the patient not being able to tolerate this.  It does not  appear that there are any changes.  There is no significant stenosis  within the bypass.   The patient will come back to see me in 3  months with repeat ultrasound  on our P-scan protocol.  I have given her a prescription for 15-20 mmHg  thigh high compression to help with her swelling.  I told her she could  start wearing this once the wound had completely closed.  She does not  need to perform daily dressing changes any more, just to keep it covered  with dry dressings.     Jorge Ny, MD  Electronically Signed   VWB/MEDQ  D:  12/17/2009  T:  12/19/2009  Job:  [id number]   cc:   Candyce Churn, M.D.

## 2011-04-01 NOTE — Assessment & Plan Note (Signed)
OFFICE VISIT   [name], [name]  DOB:  02-24-1928                                       11/15/2009  EAVWU#:[id number]   The patient returns for follow-up today.  She was recently admitted to  the hospital on December 9 for debridement of her lower extremity wound  after a previous fem-pop bypass by Dr. Myra Gianotti.  She had a VAC placed at  the time of discharge.  She returns today for further evaluation of her  wound.  She states that overall she feels well.  Her left foot still has  some occasional numbness and tingling.  This seems to be at baseline.  Temperature today is 97.4, blood pressure 198/94, heart rate 72 and  regular.  She has a 2.5 x 1.5 cm open wound on the left medial calf.  This has good granulation tissue at the base.  There was a small amount  of fibrinous exudate.  The wound is only about 1.5 mm in depth at this  time.  She has a 2+ left dorsalis pedis pulse.  Overall the wound  appears to be healing well.  I think we should discontinue the VAC at  this time.  We will do Hydrogel dressings once a day.  She will followup  with Dr. Myra Gianotti in 2 weeks for further wound care.     Janetta Hora. Fields, MD  Electronically Signed   CEF/MEDQ  D:  11/16/2009  T:  11/20/2009  Job:  [id number]

## 2011-04-01 NOTE — Assessment & Plan Note (Signed)
OFFICE VISIT   [name], [name]  DOB:  1927-12-17                                       01/07/2010  ZOXWR#:[id number]   Patient comes back today for followup with her left calf wound.  She was  seen in the PA clinic last week and changed over to wet-to-dry dressing  changes.   Today, her wound looks healthy.  It is very superficial.  She has a  palpable dorsalis pedis pulse.   I have recommend she use hydrogel once a day.  I am going to see her  back in a month for wound check.  I started her on Neurontin for  neuropathic pain control.     Jorge Ny, MD  Electronically Signed   VWB/MEDQ  D:  01/07/2010  T:  01/08/2010  Job:  [phone]

## 2011-04-01 NOTE — Assessment & Plan Note (Signed)
OFFICE VISIT   [name], [name]  DOB:  29-Dec-1927                                       10/01/2009  ZOXWR#:[id number]   REASON FOR VISIT:  Followup.   HISTORY:  This is a 77 year old female who presented to the hospital  with progressive left leg pain, was found to have a significant drop in  her ankle brachial index down to 0.32.  She underwent arteriogram, which  showed an occluded popliteal artery.  This was followed by a distal  superficial femoral to below knee popliteal artery bypass with  ipsilateral reversed vein on 10/28.  She comes in today for first follow-  up.   The pain that she was having has resolved, although she does have some  paresthesias.  She does have some blister formation around the below-  knee incision.  Her symptoms of pain have gotten better.  There was some  blood under the incision; therefore, I have recommended that we treat  this with just a dry gauze.  I think this will ultimately heal.  She is  going to see me in 3 weeks for further follow-up.  She has ankle  brachial index of 1.1 bilaterally.   Jorge Ny, MD  Electronically Signed   VWB/MEDQ  D:  10/01/2009  T:  10/02/2009  Job:  [id number]   cc:   Candyce Churn, M.D.

## 2011-04-01 NOTE — Procedures (Signed)
DUPLEX DEEP VENOUS EXAM - LOWER EXTREMITY   INDICATION:  Left lower extremity calf swelling and pain.   HISTORY:  Edema:  Yes.  Trauma/Surgery:  Left fem-pop bypass graft, October, 2010.  Pain:  Yes.  PE:  No.  Previous DVT:  No.  Anticoagulants:  Other:   DUPLEX EXAM:                CFV   SFV   PopV  PTV    GSV                R  L  R  L  R  L  R   L  R  L  Thrombosis    o  o     o     o      o     o  Spontaneous   +  +     +     +      +     +  Phasic        +  +     +     +      +     +  Augmentation  +  +     +     +      +     +  Compressible  +  +     +     +      +     +  Competent     +  +     +     +      +     +   Legend:  + - yes  o - no  p - partial  D - decreased   IMPRESSION:  No evidence of deep venous thrombosis identified.  Hematoma  in distal thigh and proximal calf identified.  New open wound in the  proximal calf, which looks like a possible smaller hematoma.    _____________________________  Janetta Hora. Fields, MD   CJ/MEDQ  D:  10/24/2009  T:  10/24/2009  Job:  [id number]

## 2011-04-01 NOTE — Assessment & Plan Note (Signed)
OFFICE VISIT   [name], [name]  DOB:  1928-06-14                                       11/26/2009  WGNFA#:[id number]   She comes back in today.  She underwent emergent SFA to below knee  popliteal bypass graft with vein on September 13, 2009.  She had wound  complications from her vein harvest site.  Dr. Darrick Penna took her to the  operating room for I and D on December 9.  She has been in a wound VAC  up until December 30 which was discontinued.  She comes back in today.  She is doing hydrogel dressing changes once a day.  Her wound is healing  nicely.  She has a palpable pedal pulse.  I plan on seeing her back in 1  month with an ultrasound to evaluate her bypass graft.     Jorge Ny, MD  Electronically Signed   VWB/MEDQ  D:  11/26/2009  T:  11/27/2009  Job:  [id number]

## 2011-04-01 NOTE — Procedures (Signed)
BYPASS GRAFT EVALUATION   INDICATION:  Follow up left lower extremity bypass graft.   HISTORY:  Diabetes:  No  Cardiac:  No  Hypertension:  Yes  Smoking:  Previous  Previous Surgery:  Left distal superficial femoral to popliteal artery  bypass graft on 09/13/2009, left distal superficial femoral artery stent  on 04/25/2010.   SINGLE LEVEL ARTERIAL EXAM                               RIGHT              LEFT  Brachial:                    196                189  Anterior tibial:             226                156  Posterior tibial:            201                152  Peroneal:  Ankle/brachial index:        1.15               0.80   PREVIOUS ABI:  Date: 09/02/2010  RIGHT:  1.06  LEFT:  1.01   LOWER EXTREMITY BYPASS GRAFT DUPLEX EXAM:   DUPLEX:  Biphasic Doppler waveforms noted throughout the left lower  extremity bypass graft with no evidence of increased velocities.   There is an elevated velocity of 501 cm/sec. noted at the origin of the  left distal superficial femoral artery stent.   IMPRESSION:  1. Patent left lower extremity bypass graft with no evidence of      internal stenosis.  2. Elevated velocity noted in the left superficial femoral artery, as      described above.  3. Bilateral Doppler waveforms and ankle-brachial indices suggest      normal perfusion of the right lower extremity and mildly decreased      perfusion of the left lower extremity.  Mildly decreased left ankle-      brachial index noted when compared to the previous exam with the      right ankle-brachial index remaining stable.   ___________________________________________  V. Charlena Cross, MD   CH/MEDQ  D:  11/26/2010  T:  11/26/2010  Job:  [id number]

## 2011-04-01 NOTE — Assessment & Plan Note (Signed)
OFFICE VISIT   [name], [name]  DOB:  01/06/1928                                       12/25/2009  FAOZH#:[id number]   ATTENDING PHYSICIAN:  Quita Skye. Hart Rochester, M.D.   Dr. Myra Gianotti is the patient's primary vascular surgeon.   The patient is a 77 year old Caucasian female who is status post a left  superficial femoral artery to below knee popliteal artery bypass graft  with vein by Dr. Myra Gianotti on 09/13/2009.  She then required incision and  debridement of a left leg wound on 10/25/2009 by Dr. Fabienne Bruns.  Today she is being seen for her left leg wound.  She recently saw Dr.  Myra Gianotti on 12/17/2009 for followup.  Following her I and D she was  discharged home with a wound VAC.  Intraoperative cultures showed  Pseudomonas aeruginosa.  She was discharged home on a 10 day course of  Levaquin.  When she saw Dr. Myra Gianotti the wound VAC had been discontinued  and the wound was nearly healed.  Per the daughter's report who is a  nurse the tissue appeared pink.  At that visit Dr. Myra Gianotti used silver  nitrate to cauterize a 1 x 1 cm area in the wound.  Following this she  was to do dry dressings only.  However, the tissue began to appear more  dark and home health has been doing once a day hydrogel dressings.  The  daughter called today because she was concerned that the wound bed  tissue did not look as pink as it had the previous week or so.  The  patient denies any erythema around the wound and has been afebrile.  Her  surveillance scan on 10/31 showed a patent bypass graft without evidence  of stenosis.  TBIs were within normal limits.   PHYSICAL EXAMINATION:  Today physical exam findings show blood pressure  of 181/83 in her left arm.  Heart rate is 92, respirations 18.  Her left  foot is warm to touch.  There is trace lower extremity edema on the  left.  Her left lower leg medial wound still appears superficial and is  approximately 1 x 1 cm.  The majority of  the wound bed is with pink  tissue, however, the central wound bed does appear darkened with small  amount of yellow fibrinous tissue as well.  There is no surrounding  erythema.   At this visit I did remove two black sutures from the wound bed that  came out easily.  These are seen to be silk stitches.  I have  recommended to go back to normal saline gauze wet-to-dry dressing  changes twice a day in hopes that the wound bed will be better debrided.  I have asked them to do this for the next 2 weeks.  She will continue to  have [facility] home health visits.  She should call if she develops fever  or erythema of her wound site.  Otherwise I will have her seen by Dr.  Myra Gianotti in the [facility] office on 02/21 for followup.  In regards to her p-  scan she can continue the 3 month protocol.   Jerold Coombe, PA   Quita Skye. Hart Rochester, M.D.  Electronically Signed   AWZ/MEDQ  D:  12/25/2009  T:  12/26/2009  Job:  [id number]   cc:  Candyce Churn, M.D.

## 2011-04-01 NOTE — Assessment & Plan Note (Signed)
OFFICE VISIT   [name], [name]  DOB:  Apr 14, 1928                                       02/04/2010  [id number]#:[id number]   Patient comes back again today for her wound, which had nearly healed.  Unfortunately it has opened up again.  She continues to do dressing  changes with hydrogel.  My concern is that this is likely related to  venous hypertension, as she has prominent edema with varicosities in her  leg.  For that reason, I am going to put her in an Unna boot to see if  we can get some of the swelling out and get this to heal.  Will get it  changed 3 times a week by home health, and I will see her back in 2  weeks.     Jorge Ny, MD  Electronically Signed   VWB/MEDQ  D:  02/04/2010  T:  02/05/2010  Job:  [id number]

## 2011-04-01 NOTE — H&P (Signed)
[name], [name]              ACCOUNT NO.:  [id number]   MEDICAL RECORD NO.:  [id number]          PATIENT TYPE:  INP   LOCATION:  1824                         FACILITY:  [facility]   PHYSICIAN:  Hollice Espy, M.D.DATE OF BIRTH:  [birth year]   DATE OF ADMISSION:  02/11/2009  DATE OF DISCHARGE:                              HISTORY & PHYSICAL   PRIMARY CARE PHYSICIAN:  Dr. Johnella Moloney.   CHIEF COMPLAINT:  Dysarthric speech.   HISTORY OF PRESENT ILLNESS:  Patient is a 77 year old white female with  a past medical history of hypertension and hyperlipidemia who presented  to the emergency room after having episode of dysarthric speech.  She  has been previously in her good health.  She has noted for the past  couple of weeks that her blood pressure has been on the rise and Dr.  Kevan Ny and the patient have had a tough time getting her numbers down to  a more reasonable level and then starting yesterday at about noon, she  had an episode of dysarthric speech, as well as she had some difficulty  trying to remember her children's names.  This lasted for about 30-40  minutes.  The family asked the patient to go to the emergency room but  the patient declined.  Then again, the same episode happened again today  at around noon and the patient took a large aspirin and this time  relented and came into the emergency room.  Her symptoms by now or  afterwards started to abate  on their own and the patient was felt to  not be a code stroke.  She was evaluated by neurology who recommended  that she be admitted by medicine for further evaluation and workup.  When I saw the patient, she was doing well.  She complained of some  generalized weakness but denies any headaches, vision changes,  dysphagia, no current chest pain, palpitations, shortness breath,  wheeze, cough, abdominal pain, hematuria, dysuria, constipation,  diarrhea, focal extremity, numbness and weakness or pain.   REVIEW OF  SYSTEMS:  Her review of systems right now is otherwise  negative.   PAST MEDICAL HISTORY:  The patient has a past medical history of  hypertension, hyperlipidemia, and history of osteoarthritis status post  a left total knee replacement.  She also has a history of GERD and a  previous history of a hysterectomy.  She also has a history of CAD.   MEDICATIONS:  The patient is currently on aspirin 81 mg p.o. daily,  Toprol XL 12.5 mg p.o. b.i.d., and Motrin p.r.n.   ALLERGIES:  She has allergy to sulfa.   SOCIAL HISTORY:  She denies any tobacco, alcohol or drug use.   FAMILY HISTORY:  Noncontributory.   PHYSICAL EXAMINATION:  VITALS ON ADMISSION:  Temperature 97.1, heart  rate 71, respirations 18, O2 sat 99% on room air.  Her blood pressure  initially was 230/89 and since then has come down to about 181/79.  GENERAL:  She is alert and oriented x3 and in no apparent distress.  HEENT:  Normocephalic atraumatic.  Mucous membranes are moist.  She has  no carotid bruits.  Cranial Nerves: II-XII are intact.  HEART:  Regular  rate and rhythm, S1-S2.  LUNGS:  Clear to auscultation bilaterally.  ABDOMEN:  Soft, nontender, nondistended.  Positive bowel sounds.  EXTREMITIES:  No clubbing, cyanosis or edema.  Currently she has no signs of any focal neurological deficits, downgoing  Babinski's, no evidence of any cerebellar dysfunction, good  coordination, and normal finger-to-nose.  MUSCULOSKELETAL:  She has some mild generalized weakness, 5-/5 but this  is all symmetric in terms of upper extension upper extremity flexion,  extension and grip and lower extremity and flexion extension.   LABORATORY DATA:  CT scan of the head shows no acute abnormalities.  White count 8.5 with H and H 12.9 and 38, MCV 90, platelet count 211.  A  BMET was not done.  I will go ahead and order one.   ASSESSMENT/PLAN:  1. Dysarthric speech, questionable transient ischemic attack.  2. Hypertension.  3.  Hyperlipidemia.   Will plan to bring in for evaluation and get an MRI, MRA, carotid  Dopplers, 2-D echo, and also check follow-up fasting lipid.  Since the  patient is already on aspirin, will go ahead and put her on Aggrenox and  go from there.      Hollice Espy, M.D.  Electronically Signed     SKK/MEDQ  D:  02/11/2009  T:  02/11/2009  Job:  [id number]   cc:   Candyce Churn, M.D.  Pramod P. Pearlean Brownie, MD

## 2011-04-01 NOTE — Procedures (Signed)
BYPASS GRAFT EVALUATION   INDICATION:  Followup of a left SFA distal to a below-knee popliteal  bypass graft.   HISTORY:  Diabetes:  No  Cardiac:  No  Hypertension:  yes  Smoking:  Previous  Previous Surgery:  Left SFA to pop bypass graft 09/13/2009.   SINGLE LEVEL ARTERIAL EXAM                               RIGHT              LEFT  Brachial:                    184                192  Anterior tibial:             Monophasic         Monophasic  Posterior tibial:            Monophasic         Monophasic  Peroneal:  Ankle/brachial index:        Not tolerated      Not tolerated.   TBI ON THE RIGHT:  0.69   TBI ON THE LEFT:  0.66   PREVIOUS ABI:  Date:  10/01/2009  RIGHT:  1.14  LEFT:  1.14   LOWER EXTREMITY BYPASS GRAFT DUPLEX EXAM:   DUPLEX:  Biphasic to monophasic Doppler waveforms throughout bypass  graft in native arteries.   IMPRESSION:  1. Patient bypass graft with no evidence of stenosis.  2. Doppler waveforms were monophasic.  3. TBIs WNL.   ___________________________________________  V. Charlena Cross, MD   CJ/MEDQ  D:  12/17/2009  T:  12/17/2009  Job:  [id number]

## 2011-04-01 NOTE — Consult Note (Signed)
[name], [name]              ACCOUNT NO.:  [id number]   MEDICAL RECORD NO.:  [id number]          PATIENT TYPE:  INP   LOCATION:  3019                         FACILITY:  [facility]   PHYSICIAN:  Marlan Palau, M.D.  DATE OF BIRTH:  1928/06/06   DATE OF CONSULTATION:  02/11/2009  DATE OF DISCHARGE:                                 CONSULTATION   HISTORY OF PRESENT ILLNESS:  [name] is a 77 year old right-  handed white female born on August 30, 1928, with a history of  hypertension.  The patient claims over the last 3 weeks, she has been  having headaches with a band-like quality around the head off and on  associated with elevation in blood pressures that have gone from her  usual 140 range to over 200.  This patient has had 2 events in the last  24 hours, one occurring around noon on February 10, 2009, lasting between  30 and 60 minutes with word finding problems, slurred speech.  The  patient also had headache at this time.  This resolved.  The patient did  not seek medical attention.  The patient had recurrence of the same  symptoms today with onset around 11:45 p.m.  The patient once again had  word finding problems, slurred speech, some right facial droop.  The  patient did not noted any numbness or weakness on the arms or legs, but  felt weak all over.  The patient was brought into the hospital, once  again resolved over time.  The patient underwent a CT scan of the head  showing no acute changes, but an old right centrum semiovale and small  vessel infarct is noted.  The patient has been on low-dose aspirin.  NIH  stroke scale score is 1.  The patient did note that she has had some  diarrhea with her blood pressure medication adjustments recently and has  had some bright red blood per rectum yesterday.  The patient is not felt  to be a t-PA candidate due to minimal deficit and the recent rectal  bleeding.   Neurology was asked to see this patient as a code stroke.   PAST MEDICAL HISTORY:  Significant for;  1. TIA of the left brain x2 over the last 24 hours.  ABCD score is 4.  2. Hypertension.  3. Dyslipidemia.  4. Right shoulder replacement.  5. Left total knee replacement.  6. Macular degeneration.  7. Hysterectomy.  8. Cataract surgery on the right.  9. Lumbosacral sympathectomy.  10.Peripheral vascular disease.   The patient has an allergy to SULFA drug.  Does not smoke or drink.   MEDICATIONS:  Toprol.  The patient believes she is on the Benicar 25/40  one daily, Crestor 10 mg daily, and aspirin 81 mg daily.   SOCIAL HISTORY:  This patient is married[place], has 3 children who are alive and well, except  1 son who had an MI at age 81.   FAMILY MEDICAL HISTORY:  Notable that the mother died at age 62 of old  age,  father died at age 38 with heart disease.  The patient has 1  brother with heart disease and hypertension, 1 sister with dyslipidemia,  otherwise in good health.   REVIEW OF SYSTEMS:  Notable for no recent fevers or chills.  The patient  has had headaches, dizziness, blurring of vision, 2 episodes of speech  changes as above.  The patient denies shortness breath, chest pains,  palpitations, has had some occasional nausea, some diarrhea recently.  Denies any blackout episodes, has had some dizziness.   PHYSICAL EXAMINATION:  VITAL SIGNS:  Blood pressure is 213/89, heart  rate 71, respiratory rate 18, and temperature afebrile.  GENERAL:  This patient is a fairly well developed elderly white female,  who is alert and cooperative at the time of the examination.  HEENT:  Pupils are equal, round, and reactive to light.  Disks are soft and flat  bilaterally.  NECK:  Supple.  No carotid bruits noted.  RESPIRATORY:  Clear.  CARDIOVASCULAR:  Regular rate and rhythm.  No obvious murmurs or rubs  noted.  ABDOMEN:  Positive bowel sounds.  No organomegaly or tenderness noted.   EXTREMITIES:  1+ edema at the ankles, varicose veins noted.  NEUROLOGIC:  Cranial nerves as above.  Facial symmetry is present.  The  patient has good sensation of the face to pinprick, soft touch  bilaterally.  She has good strength of facial muscles, muscles to head  turning, shoulder shrug bilaterally.  Speech is well enunciated, not  aphasic.  Extraocular movements are full.  Visual fields are full.  Motor testing reveals good strength in all fours.  Good symmetric motor  tone is noted throughout.  Some limitation of movement at the right  shoulder joint is noted, but no drift is seen on arms or legs.  Pinprick  sensation if anything is decreased on the left side as compared to the  right on the arm and leg.  Vibratory sensation is more symmetric  throughout.  The patient is able to perform finger-nose-finger and heel-  to-shin bilaterally.  Gait was not tested.  Again, no drift of the arm  or legs is noted.   LABORATORY VALUES:  Notable for a white count of 8.5, hemoglobin 12.9,  hematocrit 37.7, MCV of 90.2, platelets of 211, and glucose CBG is 100.  Chemistries are pending.   IMPRESSION:  1. Transient stroke event, referable to the left brain with aphasia.  2. Significant hypertension.  3. Dyslipidemia.   This patient does have risk factors for stroke.  The patient had 2  clinical events consistent with the left brain transient ischemic attack  with aphasia.  Blood pressure had been significantly elevated over the  last 3 weeks.  The patient will need to come in for further evaluation.   PLAN:  1. MRI study of the brain.  2. MRI angiogram of the intracranial and extracranial vessels.  3. A 2-D echocardiogram.  4. Plavix therapy.  5. IV fluid hydration.  6. The patient will need aggressive blood pressure management.  The      patient again has a NIH stroke scale score      of 1.  She is not a t-PA candidate due to minimal deficit and due      to recent history of rectal  bleeding.  We will follow the patient's      clinical course while in-house.   Thank you very much.       Marlan Palau, M.D.  Electronically  Signed     CKW/MEDQ  D:  02/11/2009  T:  02/12/2009  Job:  [id number]   cc:   Candyce Churn, M.D.  [facility]

## 2011-04-01 NOTE — Assessment & Plan Note (Signed)
OFFICE VISIT   [name], [name]  DOB:  1928-08-23                                       10/15/2009  [id number]#:[id number]   [name] comes back today having undergone the distal superficial  femoral to below-knee popliteal artery bypass with ipsilateral reversed  vein on September 13, 2009.  She had been having some pain and drainage,  and foul smell from her incision site.  She has been placed on  antibiotics.  She has had some edema in her leg and has now developed an  eschar with drainage over her below-knee incision.  In the office today,  I debrided this.  I got down approximately 1 cm of dead tissue and got  down to when I thought was healthy tissue.  I have elected to pack this  with a wet-to-dry dressings and will see her back in 2 weeks to see how  she is doing.  Otherwise, she is recovering nicely.   Jorge Ny, MD  Electronically Signed   VWB/MEDQ  D:  10/15/2009  T:  10/16/2009  Job:  [id number]   cc:   Candyce Churn, M.D.

## 2011-04-01 NOTE — H&P (Signed)
HISTORY AND PHYSICAL EXAMINATION   April 18, 2010   Re:  [name], [name]              DOB:  13-Dec-1927   CHIEF COMPLAINT:  Left leg pain with decreasing ABIs, status post left  superficial femoral to below-knee popliteal artery bypass with reverse  ipsilateral greater saphenous vein performed by Dr. Myra Gianotti on  09/13/2009.   Patient is a 77 year old woman with a history of peripheral vascular  disease, hypertension, TIAs, and angina who underwent a distal left  superficial femoral to below-knee popliteal artery bypass with reverse  ipsilateral greater saphenous vein by Dr. Myra Gianotti on 09/13/2009.  She  then had a debridement of a nonhealing wound of the left leg by Dr.  Darrick Penna in December 2010.  All of these wounds have healed nicely, and  the patient had been doing well until 3 weeks ago when she returned from  a trip to [place]. and noted progressive worsening calf and ankle  pain.  She has pain mostly across the ankle.  She noted some numbness in  the foot and that the foot was becoming cooler.  The patient states that  the pain is waking her up at night and is getting progressively worse.  She had ABIs done in November 2010 which showed bilateral ABIs were  1.14.  She is now here for further evaluation and repeat ABIs.   PAST MEDICAL HISTORY:  1. Significant for peripheral vascular disease, status post left fem-      pop bypass.  2. Hypertension.  3. History of TIAs.  4. Angina.   MEDICATIONS:  Metoprolol 25 mg b.i.d., aspirin 81 mg daily, Crestor 10  mg daily, Diovan/hydrochlorothiazide 320/12.5 mg 1 tablet daily,  hydrocodone 1 to 2 tabs q.6h. p.r.n. pain, Plavix 75 mg daily.   REVIEW OF SYSTEMS:  GENERAL:  She denies any weight loss, weight gain,  loss of appetite, or fever.  VASCULAR:  She has pain in the legs with walking and pain in her feet  while lying flat.  She denies any ulcers on her feet.  She has positive  history of TIAs.  CARDIAC:   She has some shortness of breath with this exertion.  She  denies any chest pain, chest tightness, or shortness of breath and is  lying flat, palpitation, heart murmurs.  GASTROINTESTINAL:  She has reflux, hiatal hernia.  NEUROLOGIC:  She denies any dizziness, blackouts, headaches or seizures.  PULMONARY:  She denies any productive cough, need for home oxygen,  bronchitis, coughing up blood, asthma, or wheezing.  HEMATOLOGICAL:  She denies any bleeding or clotting disorders.  URINARY:  She denies any tea kidney disease, burning with urination.  HEENT:  She denies any changes in eyesight or hearing.  MUSCULOSKELETAL:  She has arthritis and joint pain but no muscle pain.  PSYCHIATRIC:  She denies depression, anxiety.   PAST SURGICAL HISTORY:  She had the fem-pop bypass in October 2010.  She  had the hysterectomy.  Macular degeneration surgery in the 1990s.  She  had bilateral vein stripping in 1968.   SOCIAL HISTORY:  She is married with 3 children.  She does not smoke.  She has never smoked.  She does not drink any alcohol.   PHYSICAL EXAMINATION:  This is a pleasant elderly woman in mild pain  secondary to left leg pain.  Her heart rate is 68.  Her sats 97%.  The  respiratory rate is 12.  HEENT is grossly  within normal limits.  Her  lungs are clear.  Hear rate and rhythm is regular.  She has bilateral  palpable femoral pulses.  She has a positive Doppler signal in the vein  graft at the medial thigh.  She has positive DP pulse but no posterior  tibial pulse per Doppler.  The foot is slightly for more mottled on the  left than the right.  The right foot is warm and pink and well perfused.  The left foot is slightly more mottled and cyanotic.  She has decreased  sensation along the anterior shin and ankle to the top of the foot.  She  has good motion in both lower extremities.   ABIs done today were 0.9 on the right and 0.16 on the left, which is  significantly dampened with  monophasic flow in the posterior tibial and  dorsalis pedis.  This is majorly dampened from November.   ASSESSMENT/PLAN:  Assessment is status post left femoral-popliteal  bypass with ischemic changes in the left lower extremity x3 weeks and  progressively getting worse with increased pain and night pain.   Plan is to do an angiogram to further assess the distal vasculature and  the patency of the graft to assess for further intervention.   Della Goo, PA-C   [place]. Edilia Bo, M.D.  Electronically Signed   RR/MEDQ  D:  04/18/2010  T:  04/18/2010  Job:  [id number]

## 2011-04-04 NOTE — Op Note (Signed)
[name], [name]              ACCOUNT NO.:  [id number]   MEDICAL RECORD NO.:  [id number]          PATIENT TYPE:  INP   LOCATION:  0002                         [facility]ACILITY[facility]  [facility]   PHYSICIAN:  Erasmo Leventhal, M.D.DATE OF BIRTH:  Feb 17, 1928   DATE OF PROCEDURE:  07/10/2006  DATE OF DISCHARGE:                                 OPERATIVE REPORT   PREOPERATIVE DIAGNOSIS:  Left knee end-stage arthritis.   POSTOPERATIVE DIAGNOSIS:  Left knee end-stage arthritis.   PROCEDURE:  Left total knee arthroplasty.   SURGEON:  Erasmo Leventhal, M.D.   ASSISTANT:  Jodene Nam, P.A.-C.   ANESTHESIA:  Spinal Duramorph.   ESTIMATED BLOOD LOSS:  Less than 50 cc.   DRAINS:  Two mini Hemovac.   COMPLICATIONS:  None.   TOTAL TOURNIQUET TIME:  One hour 10 minutes at 300 mmHg.   DISPOSITION:  PACU stable.   DETAILS:  The patient underwent counseling in holding area.  Correct sitewas  identified, IV was started, antibiotics were given, taken to the OR, spinal  anesthetic was administered.  Foley catheter was placed under sterile  technique by the OR circulating nurse.  All extremities were well padded and  bumped.  Left knee was examined.  She had 3 degrees recurvatum, flexed to  130.  There is some degree of varus and valgus instability throughout the  entire flexion arc.  She was elevated, prepped with DuraPrep and draped in  sterile fashion.  Exsanguinated with an Esmarch.  Tourniquet was inflated to  300 mmHg.  The patient has a history of perivasculitis and we were very  cautious with her skin and soft tissues throughout the entire procedure.  Straight midline incision was made through the skin and subcutaneous tissues  and small veins electrocoagulated.  Medial parapatellar arthrotomy was  performed.  Proximal and medial soft tissue release was gently done.  Knee  was flexed, patella was retracted out of the way but not everted.  End-stage  arthritis changes, bone against  bone.  The cruciate ligaments were resected,  started a hole made in the distal femur.  Canal was irrigated, effluent was  clear.  Intramedullary rod was gently placed.  It shows a 9-mm cut off the  distal femur at 5 degrees of valgus.  Distal femur was found to be a size 2-  1/2, rotational marks were made and it was cut to fit a size 2-1/2.  Medial  menisci were removed.  Geniculate vessels were coagulated.  Posterior  neurovascular structures were followed and protected throughout the entire  case.  Tibia meniscus was resected, proximal tibia was found to be a size 2.  Central aspect was noted. Step reamer was utilized,  step reamer and the  canal were irrigated and effluent was clear.  Intramedullary rod was  gently  placed, shows a 2-mm cup best upon the lateral side which was the lease  efficient.  This was done at 0 degree slope giving excellent resection on  the medial side and lateral side.  Flexion and extension blocks of 10-mm, we  had excellent flexion and extension gap balance.  Posteromedial,  posterofemoral osteophytes were removed.  This was done meticulously.  Tibial baseplate was applied, set for coverage and rotation.  Reamer and  punch were then utilized.  Distal femoral box cut was then prepared.  At  this time a size 2-1/2 femur, 2 tibia, 10 insert with excellent range of  motion, soft tissue balance and  coverage.  Patellofemoral tract was  anatomic, patella was found to be size 32.  Both __________ bones were  resected, button was applied.  It had excellent patellofemoral tracking.  Trials were then removed, knee was irrigated with pulsatile lavage.  Cement  was mixed utilizing Moder cement technique.  All components were cemented  into place, size 2 tibia, size 2-1/2 femur, 10 collar insert with a 32  patella.  After the  cement had dried, excess cement was removed.  Knee was  irrigated and with a 10 insert, we had excellent flexion and extension gap,  well  balancedvarus and valgus stress and patellofemoral tracking was  anatomic.  Trial was then removed, knee was again irrigated and final 10-mm  posterior stabilized tibial implant was implanted.  We now had a well-  balanced, well-aligned knee, patellofemoral tracking anatomic.  TwoHemovac  drains were placed.  Arthrotomy was closed at 90 degrees of flexion with  subcutaneous Vicryl, skin was very gently closed with subcuticular Monocryl  suture.  Steri-Strips were applied, sterile dressing was applied.  Tourniquet was deflated.  Normal circulation of foot and ankle at end of  case.  No complications or problems.  She was then taken from the operating  room in stable condition.  Sponge and needle count correct at end of case,  no complications or problems.   To help with surgical retraction, decision making, Mr. Brett Canales Chabon's who  assisted with me throughout the entire case.           ______________________________  Erasmo Leventhal, M.D.     RAC/MEDQ  D:  07/10/2006  T:  07/10/2006  Job:  [id number]   cc:   Johnella Moloney, M.D.

## 2011-04-04 NOTE — Op Note (Signed)
NAME:  [name]                        ACCOUNT NO.:  [id number]   MEDICAL RECORD NO.:  [id number]                   PATIENT TYPE:  INP   LOCATION:  5015                                 FACILITY:  [facility]   PHYSICIAN:  Ollen Gross, M.D.                 DATE OF BIRTH:  [birth year]   DATE OF PROCEDURE:  04/11/2003  DATE OF DISCHARGE:                                 OPERATIVE REPORT   PREOPERATIVE DIAGNOSIS:  Comminuted proximal right humerus fracture.   POSTOPERATIVE DIAGNOSIS:  Comminuted proximal right humerus fracture.   OPERATION PERFORMED:  Right shoulder hemiarthroplasty.   SURGEON:  Ollen Gross, M.D.   ASSISTANT:  Alexzandrew L. Julien Girt, P.A.   ANESTHESIA:  Interscalene plus general.   ESTIMATED BLOOD LOSS:  .   DRAINS:  None.   COMPLICATIONS:  None.   CONDITION:  Stable to recovery.   INDICATIONS FOR PROCEDURE:  [name] is a 77 year old female who had a bad  fall yesterday sustaining a grossly comminuted right proximal humerus  fracture.  The fracture is nonamenable to fixation due to gross comminution  and the anterior dislocation of the humeral head with subsequent loss of  blood supply secondary to the comminution.  She presents now for shoulder  hemiarthroplasty.   DESCRIPTION OF PROCEDURE:  After successful administration of general  anesthetic, the patient was placed in a semirecumbent position, elevated  about 30 degrees and then her right upper extremity and shoulder girdle were  isolated from her trunk with plastic drapes and prepped and draped in the  usual sterile fashion.  Please note that she had successful preop  interscalene block.  After prepping and draping, we then drew the incision  line for a deltopectoral approach starting at the coracoid tip and coursing  to the axilla.  The skin was cut with a 10 blade through subcutaneous tissue  to the level of the deltoid muscle.  The deltopectoral interval was  identified and the  cephalic vein retracted laterally.  This led down to the  fracture site and there was a significant amount of fracture hematoma which  was evacuated.  The supraspinatus remained attached to the greater  tuberosity and I tagged that with #2 Ethibond sutures.  The lesser  tuberosity was also identified and it was tagged with sutures.  The humeral  head was impacted, grossly comminuted and dislocated anteriorly.  I removed  it piecemeal and saved the cancellous bone for lateral grafting.  Once all  the fragments were cleared out then we reamed the humeral canal up to a size  8.  Put the size 8 trial in and gauged the humeral height such that the top  of the trial head would be just slightly above the top of the glenoid.  The  height was then marked on the stem trial.  We had trialed a 48 by 18 and  with it reduced and held  in the proper position, there was excellent  stability throughout.  She had about 50% posterior and 50% inferior  translation.  The Global FX size 8 stem was then opened and I put the  appropriate size cement restrictor in her humeral canal.  The canal was  irrigated, cement mixed and injected into the canal.  We then cemented the  Global FX stem at the appropriate height which is five scribe marks above  the fracture line and in the appropriate 30 degrees retroversion  corresponding with the anterior fin along the biceps tendon groove.  Once  the cement had fully hardened, then we placed the 40 x 18 trial again and  reduced it with the same stability parameters.  A permanent 48 x 18 head was  then placed.  We then passed the sutures for the tuberosities through the  stem into the shaft.  I placed the cancellous bone graft underneath the  tuberosities and we sewed the two of them together with interrupted Ethibond  sutures.  We then reattached the interval between the subscap and  supraspinatus.  We left open the rotator interval superiorly.  This led to a  very stable  repair and closure.  The wound was again irrigated with  antibiotic solution and deltopectoral interval closed with interrupted #1  Vicryl.  Subcutaneous tissue closed with interrupted 2-0 Vicryl.  Subcuticular running 3-0 Monocryl.  Incision was clean and dry and Steri-  Strips and bulky sterile dressing applied.  She was then placed into a  shoulder immobilizer and awakened and transported to recovery in stable  condition.                                                Ollen Gross, M.D.    FA/MEDQ  D:  04/11/2003  T:  04/11/2003  Job:  [id number]

## 2011-04-04 NOTE — H&P (Signed)
NAME:  [name], [name]                        ACCOUNT NO.:  [id number]   MEDICAL RECORD NO.:  [id number]                   PATIENT TYPE:  INP   LOCATION:  5015                                 FACILITY:  [facility]   PHYSICIAN:  Ollen Gross, M.D.                 DATE OF BIRTH:  [birth year]   DATE OF ADMISSION:  04/10/2003  DATE OF DISCHARGE:                                HISTORY & PHYSICAL   ADMISSION DIAGNOSIS:  Right proximal humerus fracture.   OTHER DIAGNOSES:  1. Hypertension.  2. Right hip pain.   CONSULTATIONS:  None.   HISTORY OF PRESENT ILLNESS:  [name] is a 77 year old female who was  cleaning her kitchen cabinets earlier today, and upon trying to get down  from the cabinets fell and landed on her right side.  She did not have any  dizziness, headache, or presyncopal symptoms.  She came in complaining of  right shoulder pain and right hip pain.  She was taken to the emergency room  where evaluation revealed a right proximal humeral fracture dislocation.  She also had a negative head CT scan, negative C-spine films, and hip films.  She is not complaining of any systemic symptoms.  She is not having any  chest pain, shortness of breath, nausea, vomiting, or abdominal pain.  She  is not complaining of any upper and lower extremities weakness or  paresthesia.  The majority of her pain is in the right shoulder.   PAST MEDICAL HISTORY:  1. Hypertension.  2. Questionable polyarteritis.   PAST SURGICAL HISTORY:  1. Hysterectomy.  2. Lumbar sympathectomy.  3. D&C.   MEDICATIONS:  1. Toprol XL 50 mg p.o. daily.  2. Maxzide 37.5/25 mg one p.o. daily.  3. Prednisone 5 mg p.o. daily.  4. Aspirin one p.o. daily.  5. Premarin 6.25 mg p.o. daily.   ALLERGIES:  SULFA DRUGS.   PHYSICAL EXAMINATION:  GENERAL:  A very pleasant well-developed female in no  acute distress.  HEENT:  Within normal limits.  NECK:  Supple without adenopathy.  No tenderness along the C-spine.  LUNGS:  Clear to auscultation bilaterally.  CARDIAC:  Regular rate and rhythm, no murmurs, rubs, or gallops.  ABDOMEN:  Nontender, nondistended, positive bowel sounds.  MUSCULOSKELETAL:  Shows right upper extremity very tender along proximal  humeral area with slight swelling.  Sensation is intact to the right arm  with intact motor.  Radial pulse is 2+.  Wrist examination is negative.  Left upper extremity is normal.  Her pelvis is stable to compression and  distraction.  She has no pain on hip range of motion.  She is nontender over  her greater trochanters.  She is tender in the right sacral area.  There is  no crepitus on palpation.  Motor is intact of both lower extremities with 2+  dorsalis pedis pulses.   LABORATORY DATA:  Radiographs of the right  shoulder shows a comminuted  grossly displaced right proximal humeral fracture dislocation in multiple  pieces.  The C-spine is negative per report.  Chest x-ray is negative per  report.  The pelvic and hip films are negative.  She also had a head CT scan  which is negative per report.   ASSESSMENT:  1. Right hip pain.  She does not appear to have any hip pathology based on x-     rays and examination.  I am concerned that she may have a sacral     insufficiency fracture based on where she is tender.  We will just follow     this over the next several days as it may end up impacting her     rehabilitation.  If indeed she has difficulty weightbearing, we will end     up getting a bone scan to see if she indeed has an insufficiency     fracture.  2. Right proximal humeral fracture dislocation.  This is in multiple pieces     and is not amenable to internal fixation.  Even if we could fix it     internally, she is at very high risk for non-union, avascular necrosis,     or loss of fixation.  The most predictable means of improving her at this     point would be a right shoulder hemiarthroplasty.  I discussed that     procedure, risks, and  complications with her and her family in detail,     and they would like to proceed.  We are going to admit her to the     hospital tonight for pain control, and plan on surgical fixation     tomorrow.                                               Ollen Gross, M.D.    FA/MEDQ  D:  04/10/2003  T:  04/10/2003  Job:  [id number]   cc:   Candyce Churn, M.D.  [address] [place]  [place] [address]  Fax: [phone]

## 2011-04-04 NOTE — H&P (Signed)
NAME:  [name], [name]              ACCOUNT NO.:  [id number]   MEDICAL RECORD NO.:  [id number]          PATIENT TYPE:  INP   LOCATION:  NA                           FACILITY:  [facility]   PHYSICIAN:  Erasmo Leventhal, M.D.DATE OF BIRTH:  [birth year]   DATE OF ADMISSION:  07/10/2006  DATE OF DISCHARGE:                                HISTORY & PHYSICAL   CHIEF COMPLAINT:  End-stage osteoarthritis, left knee.   BRIEF HISTORY:  This is a 77 year old lady with a history of end-stage  osteoarthritis of her left knee with failure of conservative treatment to  manage her pain.  Due to continued pain with daily activities, the patient  is now scheduled for total knee arthroplasty of the left knee.  The surgery,  risks, benefits and aftercare were discussed in detail with the patient and  her daughter, questions were invited and answered and surgery to go ahead as  scheduled.  She has received medical clearance from Dr. Johnella Moloney, her  medical doctor, and Dr. Amil Amen, her cardiologist.   PAST MEDICAL HISTORY:  DRUG ALLERGIES TO SULFA WITH RASH, NAUSEA AND  VOMITING.   Current Medications:  1. Crestor 10 mg one p.o. daily.  2. Toprol-XL 50 mg one daily.  3. Nexium p.r.n.   Previous Surgeries:  1. D&C.  2. Hysterectomy.  3. Vein stripping.  4. Bladder tack.   Serious Medical Illnesses:  1. Reflux.  2. Hypertension.  3. Hyperlipidemia.   FAMILY HISTORY:  Positive for:  1. Coronary artery disease.  2. Hypertension.  3. Osteoarthritis.   SOCIAL HISTORY:  Patient is married, she is retired.  She does not smoke and  does not drink.   REVIEW OF SYSTEMS:  CENTRAL NERVOUS SYSTEM:  Negative for headache or  dizziness.  PULMONARY:  Positive for exertional shortness of breath,  negative for PND and orthopnea.  CARDIOVASCULAR:  Negative for chest pain  and palpitation.  GI:  Positive for GERD, negative for ulcers or hepatitis.  GU:  Negative for urinary tract difficulty.   MUSCULOSKELETAL:  Positive, as  in HPI.   PHYSICAL EXAM:  BP 140/78, respirations 16, pulse 60 and regular.  GENERAL APPEARANCE:  This is a well-developed, well-nourished gentleman in  no acute distress.  HEENT:  Head normocephalic.  Nose patent, ears patent.  Pupils equal, round,  reactive to light.  Throat without injection.  NECK:  Supple without adenopathy.  CAROTIDS:  Without bruit, 2+.  CHEST:  Clear to auscultation.  No rales or rhonchi, respirations 16.  HEART:  Regular rate and rhythm at 60 b.p.m. without murmur.  ABDOMEN:  Soft with active bowel sounds.  No masses or organomegaly.  NEUROLOGIC:  Patient alert and oriented to time, place and person.  Cranial  nerves II-XII grossly intact.  EXTREMITIES:  Shows a slight varus deformity of the left knee with 0-135  degree range of motion.  Neurovascular status is intact.  There is positive  crepitation without range of motion and dorsalis pedis and posterior  tibialis pulses are 2+.   XRAYS:  Show end-stage osteoarthritis of the left knee with a  slight varus  deformity.   IMPRESSION:  End-stage osteoarthritis left knee.   PLAN:  Total knee arthroplasty left knee.      Jaquelyn Bitter. Chabon, P.A.    ______________________________  Erasmo Leventhal, M.D.    SJC/MEDQ  D:  07/09/2006  T:  07/09/2006  Job:  [id number]

## 2011-04-04 NOTE — Discharge Summary (Signed)
[name], [name]              ACCOUNT NO.:  [id number]   MEDICAL RECORD NO.:  [id number]          PATIENT TYPE:  INP   LOCATION:  3019                         FACILITY:  [facility]   PHYSICIAN:  Theressa Millard, M.D.    DATE OF BIRTH:  May 24, 1928   DATE OF ADMISSION:  02/11/2009  DATE OF DISCHARGE:  02/13/2009                               DISCHARGE SUMMARY   ADMITTING DIAGNOSIS:  Transient ischemic attack.   DISCHARGE DIAGNOSES:  1. Transient ischemic attack.  2. Hypertension.  3. Dyslipidemia.  4. History of peripheral vascular disease.   The patient is a 77 year old white female who presented with dysarthric  speech.  She had been in good health, but in the week prior to  admission, her blood pressure had been up.  Blood pressure medications  have been altered at home.  She came to the emergency room when she had  episode of dysarthric speech and difficulty remembering names.  This  lasted for about 30 or 40 minutes.  She had two episodes.  She took an  aspirin prior to arriving.   HOSPITAL COURSE:  The patient was seen in the emergency department by  Dr. Anne Hahn.  He noted that her symptoms had completely resolved.  She  was thought to have had a left brain TIA.  She underwent MRI and MRA,  which showed no significant lesions, and there was no evidence of  stroke.  Carotid duplex scanning revealed a right internal carotid 60-  80% stenosis and the left internal carotid 40-60% stenosis.  She had a 2-  D echocardiogram that was unremarkable.   She did develop problems with nausea.  This was thought to be due to the  Aggrenox on which she had been started.  This was discontinued and her  nausea improved.  The plan was to give her aspirin, Plavix, good blood  pressure control, and a statin in order to decrease her risk of  recurrence.   She is discharged in improved condition.   DISCHARGE MEDICATIONS:  1. Toprol 25 mg b.i.d.  2. Diovan 160/12.5 mg 1 daily.  3. Crestor 20 mg  daily.  4. Aspirin 325 mg daily.  5. Plavix 75 mg daily.  6. Tylenol 325 mg 1-2 q.6-8 hours p.r.n. pain.   ACTIVITIES:  As tolerated.   FOLLOWUP:  She will make an appointment to see Dr. Kevan Ny in the next  week.      Theressa Millard, M.D.  Electronically Signed     Theressa Millard, M.D.  Electronically Signed    JO/MEDQ  D:  03/15/2009  T:  03/15/2009  Job:  [id number]

## 2011-04-04 NOTE — Discharge Summary (Signed)
NAME:  [name], [name]                        ACCOUNT NO.:  [id number]   MEDICAL RECORD NO.:  [id number]                   PATIENT TYPE:  INP   LOCATION:  5015                                 FACILITY:  [facility]   PHYSICIAN:  Ollen Gross, M.D.                 DATE OF BIRTH:  1928-09-19   DATE OF ADMISSION:  04/10/2003  DATE OF DISCHARGE:  04/21/2003                                 DISCHARGE SUMMARY   ADMITTING DIAGNOSES:  1. Right proximal humerus fracture.  2. Hip pain.  3. Hypertension.   DISCHARGE DIAGNOSES:  1. Comminuted proximal right humerus fracture, status post right shoulder     hemiarthroplasty.  2. Contusion, right hip.  3. Hypertension.   PROCEDURE:  The patient was taken to the operating room on Apr 11, 2003 and  underwent a right shoulder hemiarthroplasty; surgeon Dr. Ollen Gross,  assistant Alexzandrew L. Perkins, P.A.-C.  Anesthesia:  Interscalene plus  general.  Estimated blood loss 300 mL.  No drains, no complications.   CONSULTATIONS:  Rehabilitation services.   BRIEF HISTORY:  The patient is a 77 year old female who was cleaning her  kitchen cabinets earlier in the day of admission when she was trying to get  down from the cabinets and landed on her right side.  She denied any  syncopal episode or presyncopal episode.  She came down complaining of right  shoulder pain and right hip pain.  She was taken to the emergency department  where x-rays revealed a proximal right humerus fracture dislocation.  She  had a negative CT scan, negative cervical spine films, negative hip films.  She was also complaining of some right hip pain.  Due to the significant  injury, she was admitted for pain control and also surgery.  The day of  admission was Apr 10, 2003.   LABORATORY DATA:  Blood gas on Apr 10, 2003 showed a pH of 7.395, PCO2 of  38.4, bicarb of 24, total CO2 of 25.  H&H in the ER showed a hemoglobin of  13 and 38, respectively.  Serial CBC's were  followed.  Hemoglobin had  dropped postoperatively to 9.3 and 26.6.  It got as low as 8.7.  Hemoglobin  did come back up to 9.4 and 28.  White count elevated a little bit from 8.9  to 14.2.  PT and PTT on admission were 13.6 and 26, respectively, with an  INR of 1.  BMET in the ER showed a low sodium of 134, mildly elevated  glucose of 123, and BUN elevated at 26.  Followup BMET:  Sodium remained the  same, glucose improved a little bit to 118, BUN came down to a normal, and  blood group type 0+.   EKG dated Apr 10, 2003:  Normal sinus rhythm, rightward axis, no old trace  to compare, confirmed by  Dr. Kristeen Miss.  CT of the head without contrast on  Apr 10, 2003:  Mild  atrophy.  No acute intracranial abnormality.  Right hip films Apr 10, 2003:  No fractures or dislocation.  Regional bones to the pelvis are intact.  Mild  DJD.  Chest x-ray dated Apr 10, 2003:  Cardiomegaly, no active  cardiopulmonary disease, increased heart size compared with 05/09/96.  Cervical spine films:  No fractures, subluxation, or soft tissue swelling.  One view pelvis:  No pelvic or hip fractures.  Four views right elbow:  Normal study.  Right shoulder films:  Comminuted dislocated right humeral  head fracture.  Bone scan taken Apr 13, 2003:  Normal three-phase bone scan  of the pelvis and hips including the lower thoracic and lumbar spine.  No  evidence of occult fracture.   HOSPITAL COURSE:  The patient was admitted on Apr 10, 2003 for the above  complaints.  She was seen by Dr. Lequita Halt after a fall and found to have  obvious right proximal humerus fracture and also right hip pain which felt  due to contusion.  X-rays were negative and she continued to have pain  without further workup.  She was admitted to the hospital, underwent preop,  taken to the operating room the following day of Apr 11, 2003 and underwent  the above-said procedure without complication.  The patient tolerated the  procedure well.   She did have an interscalene block which started to wear  off the following day.  She started using the pain button.  She was able to  get a little bit of sleep that first night.  She was encouraged to use her  IS.  OT consult was called in for ADL's due to an upper extremity fracture.  She still had some complaints of hip pain and it was decided that the  patient would be set up for a bone scan to rule out occult fracture.  Also,  rehab consult would be called in due to slow progress.  PT and OT were  consulted postoperatively to assist with gait training, ambulation, and  ADL's.  The patient did progress very slowly with physical therapy.  She was  only ambulating approximately 10-20 feet for the first several days although  after  the following day of April 18, 2003 she started progressing very well and  ambulated 110 feet and then two days later 150 feet with a small quad cane.  She did progress very well with physical therapy in the latter half in her  hospital course.  In the meantime, in the first half, she had slow progress  due to her hip pain.  As stated, a scan was ordered.  Due to slow progress,  she was also seen by rehab services and felt that she would likely need  inpatient rehab but they would follow along to see how she progressed with  her therapy.  Dressing change initiated on postoperative day #2 and the  incision was healing well.  She was moving her hands and fingers well.  Her  block had worn off after the first day of surgery.  She was eventually  weaned over to p.o. medications and by day #3 the PCA and the IV were  discontinued.  Her bone scan results were reviewed and proved to be negative  for occult fracture.  Due to her slow progress, she was kept in the hospital  through the weekend, continued to encourage mobility.  Her pain had become  under better control and she started to work with  PT and OT a little bit more and by April 18, 2003 the patient started getting  up and walking short  distances and noted to be a little bit better with her mobility.  She did  experience some nausea over the weekend of Apr 16, 2003 and Apr 17, 2003 but  the nausea did improve, especially as her shoulder pain continued to  improve.  By April 19, 2003, she was seen by Dr. Lequita Halt.  Incision was  healing well.  Her pain was better under control.  He started to add passive  range of motion to the right shoulder as per the therapist.  At that point,  she was doing much better with her physical therapy and it was felt that she  may be able to go home.  Therefore, discharge planning started to arrange  home care.  Continued to progress well for a couple more days until April 21, 2003.  She started feeling much better.  We had been adjusting her  medications and tried her on Darvocet and eventually to Ultracet, which she  did very well on.  Therefore, on April 21, 2003 she was tolerating the  Ultracet much better than the Darvocet.  She was getting up and moving  around much easier.  It was decided that the patient could be discharged  home at that time.   DISCHARGE PLAN:  1. The patient discharged home on April 21, 2003.  2. Discharge diagnoses:  Please see above.  3. Discharge medications:     a. Ultracet p.r.n. pain.     b. Nexium.     c. Robaxin for spasm.     d. Continue her home medications.  4. Low-sodium diet.   ACTIVITY:  Weightbearing as tolerated to the right upper extremity.  She has  [facility] therapy ordered.  Home health PT and home health OT for  therapy to her right shoulder.   FOLLOW UP:  Followup with Dr. Lequita Halt in the office next Friday.  Call the  office for an appointment.    DISPOSITION:  Home.   CONDITION ON DISCHARGE:  Improved.     Alexzandrew L. Julien Girt, P.A.              Ollen Gross, M.D.    ALP/MEDQ  D:  06/05/2003  T:  06/06/2003  Job:  [id number]

## 2011-04-04 NOTE — Discharge Summary (Signed)
NAMEJET, TRAYNHAM              ACCOUNT NO.:  [id number]   MEDICAL RECORD NO.:  [id number]          PATIENT TYPE:  INP   LOCATION:  1517                         FACILITY:  [facility]   PHYSICIAN:  Erasmo Leventhal, M.D.DATE OF BIRTH:  Apr 02, 1928   DATE OF ADMISSION:  07/10/2006  DATE OF DISCHARGE:                                 DISCHARGE SUMMARY   ADMISSION DIAGNOSIS:  End stage osteoarthritis, left knee.   DISCHARGE DIAGNOSIS:  End stage osteoarthritis, left knee.   OPERATION:  Total knee arthroplasty, left knee.   BRIEF HISTORY:  This is a 77 year old lady with a history of end stage  osteoarthritis of the left knee with failure of conservative treatment to  manage her pain.  Due to continued pain with daily activity, she is  scheduled for total knee arthroplasty.  Surgery risks, benefits and  aftercare were discussed in detail with the patient and questions invited  and answered and surgery to go ahead as scheduled.   LABORATORY FINDINGS:  Admission CBC within normal limits with the exception  of slightly increased monocytes.  PT/PTT within normal limits.  CMET within  normal limits.  Hemoglobin and hematocrit reached a low of 10.4 and 29.7.  White count remained.  The BMET had slightly elevated glucose throughout  admission and on August 26 she was noted to have slight hyponatremia, slight  hypokalemia at 133 and 3.3.  IV fluids were decreased.  On August 27 her  sodium was noted to be 137, potassium 2.9.  She had two doses of potassium  20 mEq p.o. and then repeat BMET showed the potassium to be corrected.  Her  INR on discharge is 2.8.   HOSPITAL COURSE:  The patient tolerated the operative procedure well.  First  postoperative day she was feeling good.  She had some mild nausea.  Vital  signs were stable.  Sodium was mildly decreased.  She was afebrile.  Intake  was higher than output.  Hemoglobin was 10.9, hematocrit 31.4. BMET within  normal limits with exception of  elevated glucose at 144, slightly low  calcium at 8.3.  The drain was removed without difficulty.  Second  postoperative day vital signs were stable.  Temperature to 100.  Calves were  negative, wound was benign.  Lungs were clear.  Heart sounds normal.  Pulmonary toilet was initiated secondary to mild temperature at 100.  Third  postoperative day vital signs were stable.  Temperature 99.7.  Hemoglobin  and hematocrit 10.4 and 29.7.  BMET showed potassium low at 2.9.  Sodium had  corrected.  Dressing was changed, wound was benign, calves were negative.  She was given two doses of potassium chloride 20 mEq by mouth now and then  in 6 hours and then repeat BMET showed her potassium corrected.  On August  28 with vital signs stable, temperature to a max of 101 last night, now  99.2, with lungs clear with just minimally decreased sounds in the bases,  BMET corrected, heart sounds normal, rhythm regular, bowel sounds active,  calves with minimal tenderness, no cords, negative Homan's sign and  basically  unchanged and the wound being benign, she was subsequently  discharged home for follow-up in the office.   CONDITION ON DISCHARGE:  Improved.   DISCHARGE MEDICATIONS:  Percocet 1 to 2 q. 4-6 h. as needed for pain,  Robaxin 500 one by mouth every eight hours as needed for spasm.  Coumadin  per pharmacy protocol and Keflex 750 one twice daily as prophylaxis against  infection.   FOLLOWUP:  In the office in 10 days.   DISCHARGE INSTRUCTIONS:  She is to do her home CPM, do incentive spirometry,  cough or deep breathe daily and call if any problems or questions arise.      Jaquelyn Bitter. Chabon, P.A.    ______________________________  Erasmo Leventhal, M.D.    SJC/MEDQ  D:  07/14/2006  T:  07/14/2006  Job:  [id number]

## 2011-04-04 NOTE — Op Note (Signed)
[facility]  Patient:    [name], [name]                    MRN: [id number] Proc. Date: 10/05/00 Attending:  Verlin Grills, M.D. CC:         Pearla Dubonnet, M.D., [facility]   Operative Report  REFERRING PHYSICIAN:  Pearla Dubonnet, M.D.  PROCEDURE:  Colonoscopy with colonic biopsy.  INDICATION:  Ms. [name] (date of birth 24-Jun-1928) is a 77 year old female undergoing surveillance colonoscopy to prevent colon cancer.  She reports intermittent nonbloody diarrhea.  I discussed with Ms. Gilpatrick the complications associated with colonoscopy and polypectomy including intestinal bleeding and intestinal perforation. Ms. Morning has signed the operative permit.  ENDOSCOPIST:  Verlin Grills, M.D.  PREMEDICATION:  Demerol 30 mg, Versed 5 mg.  ENDOSCOPE:  Olympus pediatric colonoscope.  DESCRIPTION OF PROCEDURE:  After obtaining informed consent, the patient was placed in the left lateral decubitus position.  I administered intravenous Demerol and intravenous Versed to achieve conscious sedation for the procedure.  The patients blood pressure, oxygen saturation, and cardiac rhythm were monitored throughout the procedure and documented in the medical record.  Anal inspection was normal.  Digital rectal exam was normal.  The Olympus video pediatric colonoscope was introduced into the rectum and, under direct vision, advanced to the cecum as identified by normal-appearing ileocecal valve.  Colonic preparation for the exam today was excellent.  Rectum: Normal.  Sigmoid colon and Descending Colon: Normal.  Splenic Flexure: Normal.  Transverse Colon: Normal.  Hepatic Flexure: Normal.  Ascending Colon: Normal.  Cecum and Ileocecal Valve: Normal.  Three biopsies were taken from the left colon, and three biopsies were taken from the right colon to rule out collagenous colitis.  ASSESSMENT:  Normal proctocolonoscopy to the cecum.  Colonic biopsies to rule out microscopic-collagenous colitis pending. DD:  10/05/00 TD:  10/05/00 Job: [id number]

## 2011-04-04 NOTE — Op Note (Signed)
NAME:  [name], [name]                        ACCOUNT NO.:  [id number]   MEDICAL RECORD NO.:  [id number]                   PATIENT TYPE:  AMB   LOCATION:  DSC                                  FACILITY:  [facility]   PHYSICIAN:  Dionne Ano. Everlene Other, M.D.         DATE OF BIRTH:  10-05-1928   DATE OF PROCEDURE:  06/20/2004  DATE OF DISCHARGE:                                 OPERATIVE REPORT   PREOPERATIVE DIAGNOSES:  1. Left carpal tunnel syndrome.  2. Left end-stage basilar thumb joint arthritis.   POSTOPERATIVE DIAGNOSES:  1. Left carpal tunnel syndrome.  2. Left end-stage basilar thumb joint arthritis.   PROCEDURES:  1. Left thumb arthroplasty (removal of the trapezium at the basilar thumb     joint region), left basilar thumb joint.  2. Left abductor pollicis longus digastric portion tendon transfer to the     first metacarpal and flexor carpi radialis and abductor pollicis longus     proper (Zancolli tendon transfer/suspension), left thumb.  3. Left abductor pollicis longus one-third  proper portion tendon transfer     to the flexor carpi radialis and back upon itself with multiple figure-of-     eight throws (Weilby tendon transfer/suspension).  4. Left thumb abductor pollicis longus tenodesis (shortening of wrist     extensor at the wrist, forearm level), left thumb wrist level.  5. Left limb __________ open carpal tunnel release.   SURGEON:  Dionne Ano. Amanda Pea, M.D.   ASSISTANT:  Karie Chimera, P.A.-C.   COMPLICATIONS:  None.   ANESTHESIA:  Block anesthesia with IV sedation performed by Quita Skye. Krista Blue,  M.D.   TOURNIQUET TIME:  Less than an hour.   ESTIMATED BLOOD LOSS:  Minimal.   INDICATION FOR PROCEDURE:  The patient is a very pleasant 77 year old female  who presents with the above-mentioned diagnosis.  I have counseled her in  regard to the risks and benefits of surgery, including the risks of  infection, bleeding, anesthesia, damage to normal structures, and  failure of  surgery to accomplish its intended goals of relieving symptoms and restoring  function.  With this in mind, she desires to proceed.  All questions have  been encouraged and answered preoperatively.   OPERATIVE FINDINGS:  The patient had end-stage basilar thumb joint arthritis  and underwent double tendon transfer arthroplasty with trapezium excision  and tenodesis of the APL without difficulty.  Concomitantly a carpal tunnel  release was performed through a separate incision due to preop carpal tunnel  syndrome.  These all went without difficulty, and there were no complicating  features.   OPERATION IN DETAIL:  The patient was seen by myself and anesthesia, taken  to the operating suite, and underwent a smooth induction of IV sedation.  She was prepped and draped in the usual sterile fashion and a sterile field  was secured.  Preoperatively block anesthesia by Dr. Krista Blue was induced  without difficulty.  She tolerated  this well.  Once this was done, the  patient had the operation commence with elevation of the arm.  The  tourniquet was insufflated to 250 mmHg approximately and an incision 2 cm in  length was made at the distal edge of the transverse carpal ligament  coursing proximally.  The palmar fascia was incised.  The transverse carpal  ligament was identified, released under 4.0 loupe magnification distally.  Following this distal to proximal dissection was carried out until adequate  room was available for canal preparatory device one, two, and three, which  were placed directly in midline without patient problems.  I then placed the  security clip, obturator disengaged, placed the security knife in the  security clip, and then identified the median nerve after its release, which  looked excellent.  She was fully released.  There were no complicating  features and no iatrogenic injury to the nerve or other problems.  We  deflated the tourniquet, obtained hemostasis,  and closed the wound after  this with Prolene.  Copious irrigation, of course, was applied prior to  closure.  Once this was done, the tourniquet was reinsufflated.  A dorsal  incision was made, dissection was carried out, and superficial radial nerve  was identified and protected, as was the radial artery.  Interval between  the APL and EPB was created, the capsule was incised, Freer elevator placed  on either side of the trapezium.  It was then excised piecemeal, FCR  tenolysis was performed without difficulty, and this completed the  arthroplasty/trapezium excision portion of the procedure.  Once this was  done, the patient then underwent creation of a drill hole from dorsal to  palmar extending intra-articularly in line with the palmar beak ligament.  This was enlarged to a 3.5 drill bit.  I then harvested through a small 1.5-  2.5 cm incision the abductor pollicis longus digastric portion and the one-  third proper portion of the APL.  This was done to my satisfaction without  difficulty, and there were no complicating features with this.  These were  clipped proximally, allowed to be retrieved distally through the main  incision.  The proximal incision was then sutured after irrigation.  Following this, the patient underwent tendon transfer of the APL digastric  portion through the drill hole dorsal to palmar against the metacarpal,  around the FCR once this was done through the APL proper, and around itself  again.  This was then sewn with 4-0 Fibrewire, completing the Zancolli  tendon transfer/suspension.  Once this was done, the APL one-third proper  portion was placed around the FCR, then around the APL and then around each  other with multiple figure-of-eight throws.  The patient tolerated this  well.  There were no complicating features.  This completed the [facility]  tendon transfer.  Following this, the abductor pollicis longus was shortened with a tenodesis procedure.  This was  done without difficulty.  This was  inset with 4-0 Fibrewire, and this was shortening of a wrist extensor at the  wrist level.  Following this the patient then was copiously irrigated,  tourniquet deflated, hemostasis obtained with bipolar electrocautery,  capsule was closed and reinforced with Fibrewire, and the wound was then  closed with Prolene after hemostasis was secured.  She had excellent refill  at all digits, no complicating features with the surgery and __________.  She was taken to the recovery room in stable condition, where she will be  monitored.  We  will plan for IV antibiotics, observation, pain management,  and general postop care.  All questions have been encouraged and answered.                                               Dionne Ano. Everlene Other, M.D.    Nash Mantis  D:  06/20/2004  T:  06/21/2004  Job:  [id number]

## 2011-04-15 ENCOUNTER — Encounter (INDEPENDENT_AMBULATORY_CARE_PROVIDER_SITE_OTHER): Payer: Medicare Other

## 2011-04-15 DIAGNOSIS — Z48812 Encounter for surgical aftercare following surgery on the circulatory system: Secondary | ICD-10-CM

## 2011-04-15 DIAGNOSIS — I739 Peripheral vascular disease, unspecified: Secondary | ICD-10-CM

## 2011-04-28 NOTE — Procedures (Unsigned)
BYPASS GRAFT EVALUATION  INDICATION:  Followup bypass graft.  HISTORY: Diabetes:  No. Cardiac:  No. Hypertension:  Yes. Smoking:  Previous. Previous Surgery:  Left SFA to below knee popliteal artery bypass graft on 09/13/2009.  Left SFA distal stent on 04/25/2010.  SINGLE LEVEL ARTERIAL EXAM                              RIGHT              LEFT Brachial:                    183                178 Anterior tibial:             178                164 Posterior tibial:            122                155 Peroneal: Ankle/brachial index:        0.97               0.90  PREVIOUS ABI:  Date:  01/13/2011  RIGHT:  1.00  LEFT:  1.04  LOWER EXTREMITY BYPASS GRAFT DUPLEX EXAM:  DUPLEX:  Patent left distal SFA to below knee popliteal bypass graft and distal SFA stent. A velocity of 186 cm/s was noted in the proximal femoral artery.  IMPRESSION: 1. Right posterior tibial artery ankle brachial index has declined     from the previous study. 2. Left ankle brachial indices have declined from the previous study. 3. Patent left superficial femoral artery stent and left superficial     femoral artery to below knee popliteal artery bypass graft with     velocities noted on the following worksheet.  ___________________________________________ V. Charlena Cross, MD  EM/MEDQ  D:  04/15/2011  T:  04/15/2011  Job:  [id number]

## 2011-08-15 LAB — URINALYSIS, ROUTINE W REFLEX MICROSCOPIC
Nitrite: NEGATIVE
Protein, ur: 30 — AB
Specific Gravity, Urine: 1.013
Urobilinogen, UA: 0.2

## 2011-08-15 LAB — URINE CULTURE: Colony Count: >=100000

## 2011-08-15 LAB — URINE MICROSCOPIC-ADD ON

## 2012-01-15 ENCOUNTER — Encounter: Payer: Self-pay | Admitting: Surgery

## 2012-01-16 ENCOUNTER — Encounter: Payer: Self-pay | Admitting: Surgery

## 2012-01-19 ENCOUNTER — Encounter: Payer: Self-pay | Admitting: Surgery

## 2012-01-19 ENCOUNTER — Other Ambulatory Visit: Payer: Self-pay | Admitting: *Deleted

## 2012-01-19 ENCOUNTER — Ambulatory Visit (INDEPENDENT_AMBULATORY_CARE_PROVIDER_SITE_OTHER): Payer: Medicare Other | Admitting: Surgery

## 2012-01-19 VITALS — BP 158/75 | HR 71 | Resp 16 | Ht 62.0 in | Wt 133.0 lb

## 2012-01-19 DIAGNOSIS — I739 Peripheral vascular disease, unspecified: Secondary | ICD-10-CM | POA: Diagnosis not present

## 2012-01-19 DIAGNOSIS — I7092 Chronic total occlusion of artery of the extremities: Secondary | ICD-10-CM

## 2012-01-19 NOTE — Progress Notes (Signed)
Addended by: Sharee Pimple on: 01/19/2012 03:35 PM   Modules accepted: Orders

## 2012-01-19 NOTE — Progress Notes (Signed)
[facility] [place]   Patient name: [name] MRN: [id number] DOB: December 06, 1927 Sex: female     Chief Complaint  Patient presents with  . Follow-up    one year check-PAD    HISTORY OF PRESENT ILLNESS: The patient is back today for followup. She initially presented in October 2010 with ischemic changes to her left foot. She underwent distal left superficial femoral to below knee popliteal artery bypass graft with reversed ipsilateral greater saphenous vein she did have issues with wound healing which ultimately resolved. She developed a high-grade stenosis within the popliteal artery above the proximal anastomosis which was stented in 2011. She was also found to have an elevated velocity and on 12/11/2010 and in-stent stenosis was re\re stented using a 6 x 30 EV3 stents. She is been doing well since that time. Her biggest complaint is that of swelling. She's had difficulty wearing compression stockings. She has no ulcerations. She denies claudication or rest pain.  Past Medical History  Diagnosis Date  . Peripheral vascular disease   . Hypertension   . Stroke     History of TIA  . History of angina     Past Surgical History  Procedure Date  . Abdominal hysterectomy   . Pr vein bypass graft,aorto-fem-pop 09-13-09    Left Fem-pop  . Joint replacement     knee  . Femoral artery stent 12-11-10    Left SFA    History   Social History  . Marital Status: Married    Spouse Name: N/A    Number of Children: N/A  . Years of Education: N/A   Occupational History  . Not on file.   Social History Main Topics  . Smoking status: Former Smoker    Types: Cigarettes    Quit date: 11/17/1946  . Smokeless tobacco: Not on file  . Alcohol Use: No  . Drug Use: No  . Sexually Active: Not on file   Other Topics Concern  . Not on file   Social History Narrative  . No narrative on file    History reviewed. No pertinent family history.  Allergies as of  01/19/2012 - Review Complete 01/19/2012  Allergen Reaction Noted  . Diovan (valsartan)  01/15/2012  . Phenergan  01/15/2012  . Sulfa antibiotics  01/15/2012    Current Outpatient Prescriptions on File Prior to Visit  Medication Sig Dispense Refill  . aspirin 81 MG tablet Take 81 mg by mouth daily.      . clopidogrel (PLAVIX) 75 MG tablet Take 75 mg by mouth daily.      Marland Kitchen HYDROCHLOROTHIAZIDE PO Take by mouth.      . metoprolol succinate (TOPROL-XL) 25 MG 24 hr tablet Take 50 mg by mouth every morning. 25 mg in PM      . Multiple Vitamin (MULTIVITAMIN) tablet Take 1 tablet by mouth daily.      . rosuvastatin (CRESTOR) 10 MG tablet Take 10 mg by mouth daily.         REVIEW OF SYSTEMS: Positive for swelling. Negative for chest pain or shortness of breath. Positive for varicose veins leg heaviness and swelling all other systems are negative  PHYSICAL EXAMINATION:   Vital signs are BP 158/75  Pulse 71  Resp 16  Ht 5\' 2"  (1.575 m)  Wt 133 lb (60.328 kg)  BMI 24.33 kg/m2  SpO2 98% General: The patient appears their stated age. HEENT:  No gross abnormalities Pulmonary:  Non labored breathing Musculoskeletal: There are  no major deformities. Neurologic: No focal weakness or paresthesias are detected, Skin: There are no ulcer or rashes noted. Psychiatric: The patient has normal affect. Cardiovascular: Palpable left dorsalis pedis pulse   Diagnostic Studies None today  Assessment: Status post left leg bypass Plan: The patient continues to do very well. Unfortunately she did not get an ultrasound today. This will be rescheduled for later this week. I will continue to follow her on protocol the next time I see her will be in one year. Her next ultrasound will be in 6 months assuming there is no problems with a carotid ultrasound. I have encouraged her to wear compression stockings to help with leg swelling. I've also discussed the importance of leg elevation.  Jorge Ny,  M.D. [facility] [place] Office: [phone] Pager:  [phone]

## 2012-01-20 ENCOUNTER — Encounter: Payer: Self-pay | Admitting: Vascular Surgery

## 2012-01-21 ENCOUNTER — Encounter (INDEPENDENT_AMBULATORY_CARE_PROVIDER_SITE_OTHER): Payer: Medicare Other | Admitting: Vascular Surgery

## 2012-01-21 DIAGNOSIS — Z48812 Encounter for surgical aftercare following surgery on the circulatory system: Secondary | ICD-10-CM

## 2012-01-21 DIAGNOSIS — I7092 Chronic total occlusion of artery of the extremities: Secondary | ICD-10-CM

## 2012-01-21 DIAGNOSIS — I739 Peripheral vascular disease, unspecified: Secondary | ICD-10-CM

## 2012-01-27 ENCOUNTER — Other Ambulatory Visit: Payer: Self-pay | Admitting: *Deleted

## 2012-01-27 ENCOUNTER — Encounter: Payer: Self-pay | Admitting: Surgery

## 2012-01-27 DIAGNOSIS — I739 Peripheral vascular disease, unspecified: Secondary | ICD-10-CM

## 2012-01-27 DIAGNOSIS — Z48812 Encounter for surgical aftercare following surgery on the circulatory system: Secondary | ICD-10-CM

## 2012-01-27 NOTE — Procedures (Unsigned)
BYPASS GRAFT EVALUATION  INDICATION:  peripheral vascular disease.  HISTORY: Diabetes:  no Cardiac:  no Hypertension:  yes Smoking:  Previous. Previous Surgery:  Left distal superficial femoral artery to below knee popliteal artery bypass graft 09/13/2009; left mid/distal superficial femoral artery stent on 04/25/2010.  SINGLE LEVEL ARTERIAL EXAM                              RIGHT              LEFT Brachial: Anterior tibial: Posterior tibial: Peroneal: Ankle/brachial index:        0.92               1.01  PREVIOUS ABI:  Date: 04/15/2011  RIGHT:  0.97  LEFT:  0.90  LOWER EXTREMITY BYPASS GRAFT DUPLEX EXAM:  DUPLEX:  Elevated velocities present involving the left distal external iliac, proximal superficial femoral and mid superficial femoral arteries with velocities greater than 200 cm/sec suggestive of 50-75% stenosis.  IMPRESSION: 1. Patent distal superficial femoral artery to below knee popliteal     artery bypass graft. 2. Mildly elevated velocities present involving the left mid/distal     superficial femoral artery stent. 3. Native artery stenosis present as noted above. 4. Right brachial index has decreased and left ankle brachial index is     normal since previously study on 04/15/2011.      ___________________________________________ V. Charlena Cross, MD  SH/MEDQ  D:  01/22/2012  T:  01/22/2012  Job:  [id number]

## 2012-04-19 ENCOUNTER — Ambulatory Visit: Payer: Medicare Other | Admitting: Surgery

## 2012-05-07 ENCOUNTER — Encounter: Payer: Self-pay | Admitting: Neurosurgery

## 2012-05-10 ENCOUNTER — Encounter (INDEPENDENT_AMBULATORY_CARE_PROVIDER_SITE_OTHER): Payer: Medicare Other | Admitting: *Deleted

## 2012-05-10 ENCOUNTER — Encounter: Payer: Self-pay | Admitting: Neurosurgery

## 2012-05-10 ENCOUNTER — Ambulatory Visit (INDEPENDENT_AMBULATORY_CARE_PROVIDER_SITE_OTHER): Payer: Medicare Other | Admitting: Neurosurgery

## 2012-05-10 VITALS — BP 152/73 | HR 65 | Resp 14 | Ht 62.0 in | Wt 131.7 lb

## 2012-05-10 DIAGNOSIS — I739 Peripheral vascular disease, unspecified: Secondary | ICD-10-CM

## 2012-05-10 DIAGNOSIS — Z48812 Encounter for surgical aftercare following surgery on the circulatory system: Secondary | ICD-10-CM | POA: Diagnosis not present

## 2012-05-10 NOTE — Addendum Note (Signed)
Addended by: Sharee Pimple on: 05/10/2012 04:27 PM   Modules accepted: Orders

## 2012-05-10 NOTE — Progress Notes (Signed)
[facility] [place] [facility] Office Note  CC: Annual ABIs and lower extremity graft duplex Referring Physician: Myra Gianotti  History of Present Illness: 78 year old female patient of Dr. Myra Gianotti status post a left SFA to popliteal bypass graft that was done in 2010, she also had left SFA stent in June 2011 with 3 stenting in January 2012. The patient denies claudication, she denies rest pain and has no ulcerations. The patient also denies any new medical diagnoses or recent surgeries.  Past Medical History  Diagnosis Date  . Hypertension   . Stroke     History of TIA  . History of angina   . Peripheral vascular disease     ROS: [x]  Positive   [ ]  Denies    General: [ ]  Weight loss, [ ]  Fever, [ ]  chills Neurologic: [ ]  Dizziness, [ ]  Blackouts, [ ]  Seizure [ ]  Stroke, [ ]  "Mini stroke", [ ]  Slurred speech, [ ]  Temporary blindness; [ ]  weakness in arms or legs, [ ]  Hoarseness Cardiac: [ ]  Chest pain/pressure, [ ]  Shortness of breath at rest [ ]  Shortness of breath with exertion, [ ]  Atrial fibrillation or irregular heartbeat Vascular: [x ] Pain in legs with walking, [ ]  Pain in legs at rest, [ ]  Pain in legs at night,  [ ]  Non-healing ulcer, [x ] Blood clot in vein/DVT,   Pulmonary: [ ]  Home oxygen, [ ]  Productive cough, [ ]  Coughing up blood, [ ]  Asthma,  [ ]  Wheezing Musculoskeletal:  [ ]  Arthritis, [ ]  Low back pain, [ ]  Joint pain Hematologic: [ ]  Easy Bruising, [ ]  Anemia; [ ]  Hepatitis Gastrointestinal: [ ]  Blood in stool, [ ]  Gastroesophageal Reflux/heartburn, [ ]  Trouble swallowing Urinary: [ ]  chronic Kidney disease, [ ]  on HD - [ ]  MWF or [ ]  TTHS, [ ]  Burning with urination, [ ]  Difficulty urinating Skin: [ ]  Rashes, [ ]  Wounds Psychological: [ ]  Anxiety, [ ]  Depression   Social History History  Substance Use Topics  . Smoking status: Former Smoker    Types: Cigarettes    Quit date: 11/17/1946  . Smokeless tobacco: Not on file  . Alcohol Use: No     Family History History reviewed. No pertinent family history.  Allergies  Allergen Reactions  . Promethazine Hcl     IV  Drug only  . Sulfa Antibiotics     Current Outpatient Prescriptions  Medication Sig Dispense Refill  . aspirin 81 MG tablet Take 81 mg by mouth daily.      . clopidogrel (PLAVIX) 75 MG tablet Take 75 mg by mouth daily.      . metoprolol succinate (TOPROL-XL) 25 MG 24 hr tablet Take 50 mg by mouth every morning. 25 mg in PM      . Multiple Vitamin (MULTIVITAMIN) tablet Take 1 tablet by mouth daily.      . valsartan-hydrochlorothiazide (DIOVAN-HCT) 160-12.5 MG per tablet Take 0.5 tablets by mouth daily.      Marland Kitchen HYDROCHLOROTHIAZIDE PO Take by mouth.      . rosuvastatin (CRESTOR) 10 MG tablet Take 10 mg by mouth daily.        Physical Examination  Filed Vitals:   05/10/12 1556  BP: 152/73  Pulse: 65  Resp: 14    Body mass index is 24.09 kg/(m^2).  General:  WDWN in NAD Gait: Normal HEENT: WNL Eyes: Pupils equal Pulmonary: normal non-labored breathing , without Rales, rhonchi,  wheezing Cardiac: RRR, without  Murmurs,  rubs or gallops; No carotid bruits Abdomen: soft, NT, no masses Skin: no rashes, ulcers noted Vascular Exam/Pulses: Palpable posterior tibials bilaterally, palpable femoral  bilaterally  Extremities without ischemic changes, no Gangrene , no cellulitis; no open wounds;  Musculoskeletal: no muscle wasting or atrophy  Neurologic: A&O X 3; Appropriate Affect ; SENSATION: normal; MOTOR FUNCTION:  moving all extremities equally. Speech is fluent/normal  Non-Invasive Vascular Imaging: Lower extremity bypass graft duplex shows biphasic waveform throughout the left lower extremity arterial system, there is one elevation of 233 in the left mid SFA, ABIs today are 1.01 on the right, 0.99 on the left  ASSESSMENT/PLAN: Asymptomatic patient but does report some discomfort with walking but she states is not what she would truly call "pain", the  patient will followup in 6 months for repeat bypass graft evaluation and ABIs. She and her daughter are in agreement with this, their questions were encouraged and answered.  Lauree Chandler ANP  Clinic M.D.: Myra Gianotti

## 2012-05-17 NOTE — Procedures (Unsigned)
BYPASS GRAFT EVALUATION  INDICATION:  Left lower extremity bypass graft and stent.  HISTORY: Diabetes:  No Cardiac:  No Hypertension:  Yes Smoking:  Previous Previous Surgery:  Left distal superficial femoral to popliteal artery bypass graft on 09/13/2009, left superficial femoral artery stents on 04/25/2010 and 12/11/2010.  SINGLE LEVEL ARTERIAL EXAM                              RIGHT              LEFT Brachial: Anterior tibial: Posterior tibial: Peroneal: Ankle/brachial index:  PREVIOUS ABI:  Date:  RIGHT:  LEFT:  LOWER EXTREMITY BYPASS GRAFT DUPLEX EXAM:  DUPLEX:  Biphasic Doppler waveforms noted throughout the left lower extremity arterial system and bypass graft.  There is a maximum velocity of 233 cm/sec noted in the left mid superficial femoral artery.  IMPRESSION: 1. Patent left lower extremity bypass graft with no evidence of     stenosis. 2. There is a velocity of greater than 200 cm/sec noted, as described     above. 3. Bilateral ankle brachial index are noted on a separate report.  ___________________________________________ V. Charlena Cross, MD  CH/MEDQ  D:  05/14/2012  T:  05/14/2012  Job:  [id number]

## 2012-05-23 ENCOUNTER — Encounter (HOSPITAL_BASED_OUTPATIENT_CLINIC_OR_DEPARTMENT_OTHER): Payer: Self-pay | Admitting: *Deleted

## 2012-05-23 ENCOUNTER — Emergency Department (HOSPITAL_BASED_OUTPATIENT_CLINIC_OR_DEPARTMENT_OTHER)
Admission: EM | Admit: 2012-05-23 | Discharge: 2012-05-23 | Disposition: A | Discharge status: Home / Self Care | Payer: Medicare Other | Attending: Emergency Medicine | Admitting: Emergency Medicine

## 2012-05-23 ENCOUNTER — Emergency Department (HOSPITAL_BASED_OUTPATIENT_CLINIC_OR_DEPARTMENT_OTHER): Payer: Medicare Other

## 2012-05-23 DIAGNOSIS — S99919A Unspecified injury of unspecified ankle, initial encounter: Secondary | ICD-10-CM | POA: Diagnosis not present

## 2012-05-23 DIAGNOSIS — M7989 Other specified soft tissue disorders: Secondary | ICD-10-CM | POA: Diagnosis not present

## 2012-05-23 DIAGNOSIS — X58XXXA Exposure to other specified factors, initial encounter: Secondary | ICD-10-CM | POA: Insufficient documentation

## 2012-05-23 DIAGNOSIS — Z79899 Other long term (current) drug therapy: Secondary | ICD-10-CM | POA: Insufficient documentation

## 2012-05-23 DIAGNOSIS — I10 Essential (primary) hypertension: Secondary | ICD-10-CM | POA: Diagnosis not present

## 2012-05-23 DIAGNOSIS — Z7982 Long term (current) use of aspirin: Secondary | ICD-10-CM | POA: Diagnosis not present

## 2012-05-23 DIAGNOSIS — Z87891 Personal history of nicotine dependence: Secondary | ICD-10-CM | POA: Insufficient documentation

## 2012-05-23 DIAGNOSIS — S99912A Unspecified injury of left ankle, initial encounter: Secondary | ICD-10-CM

## 2012-05-23 DIAGNOSIS — S8990XA Unspecified injury of unspecified lower leg, initial encounter: Secondary | ICD-10-CM | POA: Insufficient documentation

## 2012-05-23 MED ORDER — TRAMADOL HCL 50 MG PO TABS: 50.0000 mg | ORAL_TABLET | Freq: Four times a day (QID) | ORAL | Status: AC | PRN

## 2012-05-23 MED ORDER — SODIUM CHLORIDE 0.9 % IV SOLN: 500.0000 mg | Freq: Once | INTRAVENOUS | Status: DC

## 2012-05-23 MED ORDER — VANCOMYCIN HCL IN DEXTROSE 1-5 GM/200ML-% IV SOLN: 1000.0000 mg | Freq: Once | INTRAVENOUS | Status: DC

## 2012-05-23 MED ORDER — TRAMADOL HCL 50 MG PO TABS
50.0000 mg | ORAL_TABLET | Freq: Once | ORAL | Status: AC
  Administered 2012-05-23: 50 mg via ORAL
  Filled 2012-05-23: qty 1

## 2012-05-23 NOTE — ED Provider Notes (Signed)
History   This chart was scribed for Cyndra Numbers, MD by Charolett Bumpers . The patient was seen in room [facility].    CSN: [id number]  Arrival date & time 05/23/12  1423   First MD Initiated Contact with Patient 05/23/12 1530      Chief Complaint  Patient presents with  . Ankle Pain    (Consider location/radiation/quality/duration/timing/severity/associated sxs/prior treatment) HPI [name] is a 78 y.o. female who presents to the Emergency Department complaining of moderate, constant left ankle pain of a 6-7/10 severity described as shooting since yesterday. Pt noticed a cramp in her leg yesterday and noted that her pain began after she had to force her ankle into a different position straight down her leg. She took Tylenol this morning for pain with some relief. Patient reports that it is not unusual for her foot to draw that she describes. Pt reports she usually experiences foot swelling and foot and leg pain, but currently is not feeling any more pain than usual. Patient describes the ankle pain as "nerve pain".  Pt denies hearing any "popping" noises. Patient denies any known injury. Pt has a h/o vein bypass, knee replacement and femoral artery stent in her left leg. Pt also reports a h/o osteoporosis and arthritis. Pt states that she is able to ambulate independently.    Past Medical History  Diagnosis Date  . Hypertension   . Stroke     History of TIA  . History of angina   . Peripheral vascular disease     Past Surgical History  Procedure Date  . Abdominal hysterectomy   . Pr vein bypass graft,aorto-fem-pop 09-13-09    Left Fem-pop  . Joint replacement     knee  . Femoral artery stent 12-11-10    Left SFA    No family history on file.  History  Substance Use Topics  . Smoking status: Former Smoker    Types: Cigarettes    Quit date: 11/17/1946  . Smokeless tobacco: Not on file  . Alcohol Use: No    OB History    Grav Para Term Preterm Abortions  TAB SAB Ect Mult Living                  Review of Systems  Constitutional: Negative.   HENT: Negative.   Eyes: Negative.   Respiratory: Negative.   Cardiovascular: Negative.   Gastrointestinal: Negative.   Genitourinary: Negative.   Musculoskeletal:       Ankle pain  Skin: Negative.   Neurological: Negative.   Hematological: Negative.   Psychiatric/Behavioral: Negative.   All other systems reviewed and are negative.   A complete 10 system review of systems was obtained and all systems are negative except as noted in the HPI and PMH.   Allergies  Promethazine hcl and Sulfa antibiotics  Home Medications   Current Outpatient Rx  Name Route Sig Dispense Refill  . ACETAMINOPHEN 500 MG PO TABS Oral Take 1,000 mg by mouth every 6 (six) hours as needed. Patient took this medication for her pain.    . ASPIRIN 81 MG PO TABS Oral Take 81 mg by mouth daily.    Marland Kitchen CLOPIDOGREL BISULFATE 75 MG PO TABS Oral Take 75 mg by mouth daily.    Marland Kitchen HYDROCHLOROTHIAZIDE PO Oral Take by mouth.    . METOPROLOL SUCCINATE ER 25 MG PO TB24 Oral Take 50 mg by mouth every morning. Patient took this medication at 8:30 am this morning.    Marland Kitchen  ONE-DAILY MULTI VITAMINS PO TABS Oral Take 1 tablet by mouth daily.    Marland Kitchen VALSARTAN-HYDROCHLOROTHIAZIDE 160-12.5 MG PO TABS Oral Take 0.5 tablets by mouth daily.      BP 146/63  Pulse 74  Temp 97.7 F (36.5 C) (Oral)  Resp 20  Ht 5\' 2"  (1.575 m)  Wt 130 lb (58.968 kg)  BMI 23.78 kg/m2  SpO2 100%  Physical Exam  Nursing note and vitals reviewed. GEN: Well-developed, well-nourished elderly female in no distress HEENT: Atraumatic, normocephalic.  EYES: PERRLA BL, no scleral icterus. NECK: Trachea midline, no meningismus PULM: No respiratory distress.   Neuro: cranial nerves grossly 2-12 intact, no abnormalities of strength or sensation, A and O x 3 MSK: Patient with tenderness to palpation just at the tip of the left inferior malleolus. The left foot is warm and  well perfused. Dorsalis pedis and posterior tibialis pulses are 1+, otherwise exam is within normal limits  Skin: No rashes petechiae, purpura, or jaundice Psych: no abnormality of mood   ED Course  Procedures (including critical care time)  DIAGNOSTIC STUDIES: Oxygen Saturation is 100% on room air, normal by my interpretation.    COORDINATION OF CARE:  1547: Discussed planned course of treatment with the patient, who is agreeable at this time. Will order an x-ray.  1644: Informed patient of imaging results.     Labs Reviewed - No data to display Dg Ankle Complete Left  05/23/2012  *RADIOLOGY REPORT*  Clinical Data: Chronic left ankle swelling.  No recent injury.  LEFT ANKLE COMPLETE - 3+ VIEW  Comparison: 10/18/2010.  Findings: Again demonstrated is diffuse osteopenia.  Mild diffuse soft tissue swelling.  No effusion.  IMPRESSION: Mild diffuse soft tissue swelling without underlying acute bony abnormality.  Original Report Authenticated By: Darrol Angel, M.D.     1. Left ankle injury       MDM  Patient was evaluated by myself. Patient complained of tenderness to palpation and was concerned mainly that she had a possible bony injury from forcing her left foot into a straightened position yesterday. No injury was appreciated on plain film it was reviewed by myself and read by radiology. Patient had taken Tylenol at home for pain and initially did not want any pain medication. Following study patient did request something and was given ice pack as well as tramadol. She already wrapped her foot at home and had not had significant improvement for this as she declined this here. Patient has significant history of peripheral vascular disease but her foot was warm and well-perfused and I have no concern for arterial pathology causing her symptoms. Patient reported that the cramping in her leg with resultant "drawing up" she described was her baseline and this was not present during my exam.  Patient was given the name of Dr. Pearletha Forge with sports medicine if she continues have problems with this. Patient denies any calf pain or other symptoms that might suggest thromboembolic etiology of her pain. Patient was discharged in good condition with prescription for tramadol and can followup with her primary care physician.  I personally performed the services described in this documentation, which was scribed in my presence. The recorded information has been reviewed and considered.         Cyndra Numbers, MD 05/24/12 0000

## 2012-05-23 NOTE — ED Notes (Signed)
Pt states she has a hx of stents, by-pass, swelling and cramps ibn her left leg. Yesterday began having pain in her left ankle. "Drawing". Presents with ankle wrapped. Ambulatory. Declines wheelchair.

## 2012-08-11 DIAGNOSIS — H35349 Macular cyst, hole, or pseudohole, unspecified eye: Secondary | ICD-10-CM | POA: Diagnosis not present

## 2012-08-11 DIAGNOSIS — H43819 Vitreous degeneration, unspecified eye: Secondary | ICD-10-CM | POA: Diagnosis not present

## 2012-08-11 DIAGNOSIS — H26499 Other secondary cataract, unspecified eye: Secondary | ICD-10-CM | POA: Diagnosis not present

## 2012-08-27 DIAGNOSIS — Z23 Encounter for immunization: Secondary | ICD-10-CM | POA: Diagnosis not present

## 2012-09-14 DIAGNOSIS — E538 Deficiency of other specified B group vitamins: Secondary | ICD-10-CM | POA: Diagnosis not present

## 2012-09-14 DIAGNOSIS — Z0000 Encounter for general adult medical examination without abnormal findings: Secondary | ICD-10-CM | POA: Diagnosis not present

## 2012-09-14 DIAGNOSIS — Z79899 Other long term (current) drug therapy: Secondary | ICD-10-CM | POA: Diagnosis not present

## 2012-09-14 DIAGNOSIS — G4700 Insomnia, unspecified: Secondary | ICD-10-CM | POA: Diagnosis not present

## 2012-09-14 DIAGNOSIS — G8929 Other chronic pain: Secondary | ICD-10-CM | POA: Diagnosis not present

## 2012-09-14 DIAGNOSIS — B0229 Other postherpetic nervous system involvement: Secondary | ICD-10-CM | POA: Diagnosis not present

## 2012-09-14 DIAGNOSIS — Z1331 Encounter for screening for depression: Secondary | ICD-10-CM | POA: Diagnosis not present

## 2012-09-14 DIAGNOSIS — I10 Essential (primary) hypertension: Secondary | ICD-10-CM | POA: Diagnosis not present

## 2012-09-14 DIAGNOSIS — E559 Vitamin D deficiency, unspecified: Secondary | ICD-10-CM | POA: Diagnosis not present

## 2012-09-14 DIAGNOSIS — E7800 Pure hypercholesterolemia, unspecified: Secondary | ICD-10-CM | POA: Diagnosis not present

## 2012-11-19 ENCOUNTER — Encounter: Payer: Self-pay | Admitting: Neurosurgery

## 2012-11-22 ENCOUNTER — Ambulatory Visit: Payer: Medicare Other | Admitting: Neurosurgery

## 2012-11-22 ENCOUNTER — Ambulatory Visit (INDEPENDENT_AMBULATORY_CARE_PROVIDER_SITE_OTHER): Payer: Medicare Other | Admitting: Vascular Surgery

## 2012-11-22 ENCOUNTER — Ambulatory Visit (INDEPENDENT_AMBULATORY_CARE_PROVIDER_SITE_OTHER): Payer: Medicare Other | Admitting: Neurosurgery

## 2012-11-22 ENCOUNTER — Encounter: Payer: Self-pay | Admitting: Neurosurgery

## 2012-11-22 VITALS — BP 160/78 | HR 65 | Resp 18 | Ht 62.0 in | Wt 130.0 lb

## 2012-11-22 DIAGNOSIS — I739 Peripheral vascular disease, unspecified: Secondary | ICD-10-CM

## 2012-11-22 DIAGNOSIS — Z48812 Encounter for surgical aftercare following surgery on the circulatory system: Secondary | ICD-10-CM

## 2012-11-22 NOTE — Progress Notes (Signed)
Left lower extremity arterial duplex performed @ [facility] 11/22/2012

## 2012-11-22 NOTE — Addendum Note (Signed)
Addended by: Dannielle Karvonen on: 11/22/2012 04:24 PM   Modules accepted: Orders

## 2012-11-22 NOTE — Progress Notes (Signed)
Ankle brachial index performed @ [facility] 11/22/2012

## 2012-11-22 NOTE — Progress Notes (Signed)
[facility] Office Note  CC: PAD surveillance Referring Physician: Brabham  History of Present Illness: 79 year old female patient of Dr. Myra Gianotti status post a left SFA to popliteal bypass graft that was done in 2010, she also had left SFA stent in June 2011 and 2012. The patient denies true claudication, she has no rest pain but she does have 2-3+ pitting edema bilaterally when she is up for long periods of time. The patient has no other complaints at this time.   Past Medical History  Diagnosis Date  . Hypertension   . Stroke     History of TIA  . History of angina   . Peripheral vascular disease     ROS: [x]  Positive   [ ]  Denies    General: [ ]  Weight loss, [ ]  Fever, [ ]  chills Neurologic: [ ]  Dizziness, [ ]  Blackouts, [ ]  Seizure [ ]  Stroke, [ ]  "Mini stroke", [ ]  Slurred speech, [ ]  Temporary blindness; [ ]  weakness in arms or legs, [ ]  Hoarseness Cardiac: [ ]  Chest pain/pressure, [ ]  Shortness of breath at rest [ ]  Shortness of breath with exertion, [ ]  Atrial fibrillation or irregular heartbeat Vascular: [ ]  Pain in legs with walking, [ ]  Pain in legs at rest, [ ]  Pain in legs at night,  [ ]  Non-healing ulcer, [ ]  Blood clot in vein/DVT,   Pulmonary: [ ]  Home oxygen, [ ]  Productive cough, [ ]  Coughing up blood, [ ]  Asthma,  [ ]  Wheezing Musculoskeletal:  [ ]  Arthritis, [ ]  Low back pain, [ ]  Joint pain Hematologic: [ ]  Easy Bruising, [ ]  Anemia; [ ]  Hepatitis Gastrointestinal: [ ]  Blood in stool, [ ]  Gastroesophageal Reflux/heartburn, [ ]  Trouble swallowing Urinary: [ ]  chronic Kidney disease, [ ]  on HD - [ ]  MWF or [ ]  TTHS, [ ]  Burning with urination, [ ]  Difficulty urinating Skin: [ ]  Rashes, [ ]  Wounds Psychological: [ ]  Anxiety, [ ]  Depression   Social History History  Substance Use Topics  . Smoking status: Former Smoker    Types: Cigarettes    Quit date: 11/17/1946  . Smokeless tobacco: Never Used  . Alcohol Use: No     Family History History reviewed. No pertinent family history.  Allergies  Allergen Reactions  . Promethazine Hcl     IV  Drug only  . Sulfa Antibiotics     Current Outpatient Prescriptions  Medication Sig Dispense Refill  . acetaminophen (TYLENOL) 500 MG tablet Take 1,000 mg by mouth every 6 (six) hours as needed. Patient took this medication for her pain.      Marland Kitchen aspirin 81 MG tablet Take 81 mg by mouth daily.      . clopidogrel (PLAVIX) 75 MG tablet Take 75 mg by mouth daily.      Marland Kitchen HYDROCHLOROTHIAZIDE PO Take by mouth.      . metoprolol succinate (TOPROL-XL) 25 MG 24 hr tablet Take 50 mg by mouth every morning. Patient took this medication at 8:30 am this morning.      . Multiple Vitamin (MULTIVITAMIN) tablet Take 1 tablet by mouth daily.      . valsartan-hydrochlorothiazide (DIOVAN-HCT) 160-12.5 MG per tablet Take 0.5 tablets by mouth daily.        Physical Examination  Filed Vitals:   11/22/12 1557  BP: 160/78  Pulse: 65  Resp: 18    Body mass index is 23.78 kg/(m^2).  General:  WDWN in NAD Gait:  Normal HEENT: WNL Eyes: Pupils equal Pulmonary: normal non-labored breathing , without Rales, rhonchi,  wheezing Cardiac: RRR, without  Murmurs, rubs or gallops; No carotid bruits Abdomen: soft, NT, no masses Skin: no rashes, ulcers noted Vascular Exam/Pulses: Palpable femoral pulses bilaterally lower extremity pulses are dampened due to 2+ pitting edema  Extremities without ischemic changes, no Gangrene , no cellulitis; no open wounds;  Musculoskeletal: no muscle wasting or atrophy  Neurologic: A&O X 3; Appropriate Affect ; SENSATION: normal; MOTOR FUNCTION:  moving all extremities equally. Speech is fluent/normal  Non-Invasive Vascular Imaging: ABIs today are 0.88 and triphasic on the right, 1.02 and triphasic on the left, the patient does have a patent left distal SFA popliteal artery bypass graft with only minimal elevation in velocity within the  bypass.  ASSESSMENT/PLAN: Asymptomatic patient the declines any further diagnostics or intervention at this time. The patient will followup in 6 months with repeat ABIs and graft duplex. Her questions were encouraged and answered, she is in agreement with this plan.  Lauree Chandler ANP  Clinic M.D.: [facility]

## 2012-12-10 DIAGNOSIS — H2640 Unspecified secondary cataract: Secondary | ICD-10-CM | POA: Diagnosis not present

## 2012-12-10 DIAGNOSIS — Z961 Presence of intraocular lens: Secondary | ICD-10-CM | POA: Diagnosis not present

## 2012-12-10 DIAGNOSIS — H35349 Macular cyst, hole, or pseudohole, unspecified eye: Secondary | ICD-10-CM | POA: Diagnosis not present

## 2012-12-10 DIAGNOSIS — H5440 Blindness, one eye, unspecified eye: Secondary | ICD-10-CM | POA: Diagnosis not present

## 2012-12-23 DIAGNOSIS — H26499 Other secondary cataract, unspecified eye: Secondary | ICD-10-CM | POA: Diagnosis not present

## 2012-12-23 DIAGNOSIS — H2640 Unspecified secondary cataract: Secondary | ICD-10-CM | POA: Diagnosis not present

## 2013-01-24 ENCOUNTER — Ambulatory Visit: Payer: Medicare Other | Admitting: Surgery

## 2013-03-15 DIAGNOSIS — I739 Peripheral vascular disease, unspecified: Secondary | ICD-10-CM | POA: Diagnosis not present

## 2013-03-15 DIAGNOSIS — I10 Essential (primary) hypertension: Secondary | ICD-10-CM | POA: Diagnosis not present

## 2013-03-15 DIAGNOSIS — G8929 Other chronic pain: Secondary | ICD-10-CM | POA: Diagnosis not present

## 2013-03-15 DIAGNOSIS — E7800 Pure hypercholesterolemia, unspecified: Secondary | ICD-10-CM | POA: Diagnosis not present

## 2013-03-15 DIAGNOSIS — R079 Chest pain, unspecified: Secondary | ICD-10-CM | POA: Diagnosis not present

## 2013-03-15 DIAGNOSIS — B0229 Other postherpetic nervous system involvement: Secondary | ICD-10-CM | POA: Diagnosis not present

## 2013-03-15 DIAGNOSIS — G4700 Insomnia, unspecified: Secondary | ICD-10-CM | POA: Diagnosis not present

## 2013-03-15 DIAGNOSIS — E559 Vitamin D deficiency, unspecified: Secondary | ICD-10-CM | POA: Diagnosis not present

## 2013-04-26 DIAGNOSIS — B0229 Other postherpetic nervous system involvement: Secondary | ICD-10-CM | POA: Diagnosis not present

## 2013-04-26 DIAGNOSIS — G8929 Other chronic pain: Secondary | ICD-10-CM | POA: Diagnosis not present

## 2013-04-26 DIAGNOSIS — G4700 Insomnia, unspecified: Secondary | ICD-10-CM | POA: Diagnosis not present

## 2013-04-26 DIAGNOSIS — I10 Essential (primary) hypertension: Secondary | ICD-10-CM | POA: Diagnosis not present

## 2013-04-26 DIAGNOSIS — R079 Chest pain, unspecified: Secondary | ICD-10-CM | POA: Diagnosis not present

## 2013-04-26 DIAGNOSIS — E7800 Pure hypercholesterolemia, unspecified: Secondary | ICD-10-CM | POA: Diagnosis not present

## 2013-04-26 DIAGNOSIS — E559 Vitamin D deficiency, unspecified: Secondary | ICD-10-CM | POA: Diagnosis not present

## 2013-05-10 ENCOUNTER — Ambulatory Visit: Payer: Medicare Other | Admitting: Neurosurgery

## 2013-05-23 ENCOUNTER — Ambulatory Visit: Payer: Medicare Other | Admitting: Neurosurgery

## 2013-05-25 ENCOUNTER — Encounter (INDEPENDENT_AMBULATORY_CARE_PROVIDER_SITE_OTHER): Payer: Medicare Other | Admitting: *Deleted

## 2013-05-25 DIAGNOSIS — Z48812 Encounter for surgical aftercare following surgery on the circulatory system: Secondary | ICD-10-CM

## 2013-05-25 DIAGNOSIS — I739 Peripheral vascular disease, unspecified: Secondary | ICD-10-CM

## 2013-05-27 ENCOUNTER — Other Ambulatory Visit: Payer: Self-pay | Admitting: *Deleted

## 2013-05-27 DIAGNOSIS — Z48812 Encounter for surgical aftercare following surgery on the circulatory system: Secondary | ICD-10-CM

## 2013-05-27 DIAGNOSIS — I739 Peripheral vascular disease, unspecified: Secondary | ICD-10-CM

## 2013-05-31 ENCOUNTER — Encounter: Payer: Self-pay | Admitting: Vascular Surgery

## 2013-08-31 DIAGNOSIS — W109XXA Fall (on) (from) unspecified stairs and steps, initial encounter: Secondary | ICD-10-CM

## 2013-08-31 HISTORY — DX: Fall (on) (from) unspecified stairs and steps, initial encounter: W10.9XXA

## 2013-09-01 ENCOUNTER — Emergency Department (HOSPITAL_COMMUNITY): Payer: Medicare Other

## 2013-09-01 ENCOUNTER — Inpatient Hospital Stay (HOSPITAL_COMMUNITY)
Admission: EM | Admit: 2013-09-01 | Discharge: 2013-09-08 | DRG: 507 | Disposition: A | Discharge status: Skilled Nursing Facility | Payer: Medicare Other | Attending: Orthopedic Surgery | Admitting: Orthopedic Surgery

## 2013-09-01 ENCOUNTER — Inpatient Hospital Stay (HOSPITAL_COMMUNITY): Payer: Medicare Other

## 2013-09-01 ENCOUNTER — Encounter (HOSPITAL_COMMUNITY): Payer: Self-pay | Admitting: Emergency Medicine

## 2013-09-01 DIAGNOSIS — F329 Major depressive disorder, single episode, unspecified: Secondary | ICD-10-CM | POA: Diagnosis not present

## 2013-09-01 DIAGNOSIS — E785 Hyperlipidemia, unspecified: Secondary | ICD-10-CM

## 2013-09-01 DIAGNOSIS — S42309D Unspecified fracture of shaft of humerus, unspecified arm, subsequent encounter for fracture with routine healing: Secondary | ICD-10-CM | POA: Diagnosis not present

## 2013-09-01 DIAGNOSIS — S52023A Displaced fracture of olecranon process without intraarticular extension of unspecified ulna, initial encounter for closed fracture: Secondary | ICD-10-CM | POA: Diagnosis not present

## 2013-09-01 DIAGNOSIS — S5400XA Injury of ulnar nerve at forearm level, unspecified arm, initial encounter: Secondary | ICD-10-CM | POA: Diagnosis not present

## 2013-09-01 DIAGNOSIS — S79912A Unspecified injury of left hip, initial encounter: Secondary | ICD-10-CM

## 2013-09-01 DIAGNOSIS — Z7982 Long term (current) use of aspirin: Secondary | ICD-10-CM | POA: Diagnosis not present

## 2013-09-01 DIAGNOSIS — S82402A Unspecified fracture of shaft of left fibula, initial encounter for closed fracture: Secondary | ICD-10-CM

## 2013-09-01 DIAGNOSIS — IMO0002 Reserved for concepts with insufficient information to code with codable children: Secondary | ICD-10-CM | POA: Diagnosis not present

## 2013-09-01 DIAGNOSIS — M1990 Unspecified osteoarthritis, unspecified site: Secondary | ICD-10-CM | POA: Diagnosis not present

## 2013-09-01 DIAGNOSIS — S72009D Fracture of unspecified part of neck of unspecified femur, subsequent encounter for closed fracture with routine healing: Secondary | ICD-10-CM | POA: Diagnosis not present

## 2013-09-01 DIAGNOSIS — M7989 Other specified soft tissue disorders: Secondary | ICD-10-CM | POA: Diagnosis not present

## 2013-09-01 DIAGNOSIS — S52123A Displaced fracture of head of unspecified radius, initial encounter for closed fracture: Secondary | ICD-10-CM | POA: Diagnosis not present

## 2013-09-01 DIAGNOSIS — S8263XA Displaced fracture of lateral malleolus of unspecified fibula, initial encounter for closed fracture: Secondary | ICD-10-CM | POA: Diagnosis not present

## 2013-09-01 DIAGNOSIS — Z79899 Other long term (current) drug therapy: Secondary | ICD-10-CM | POA: Diagnosis not present

## 2013-09-01 DIAGNOSIS — Z96659 Presence of unspecified artificial knee joint: Secondary | ICD-10-CM

## 2013-09-01 DIAGNOSIS — S42413A Displaced simple supracondylar fracture without intercondylar fracture of unspecified humerus, initial encounter for closed fracture: Secondary | ICD-10-CM | POA: Diagnosis not present

## 2013-09-01 DIAGNOSIS — M25529 Pain in unspecified elbow: Secondary | ICD-10-CM | POA: Diagnosis not present

## 2013-09-01 DIAGNOSIS — M25559 Pain in unspecified hip: Secondary | ICD-10-CM | POA: Diagnosis not present

## 2013-09-01 DIAGNOSIS — S8990XA Unspecified injury of unspecified lower leg, initial encounter: Secondary | ICD-10-CM | POA: Diagnosis not present

## 2013-09-01 DIAGNOSIS — W19XXXA Unspecified fall, initial encounter: Secondary | ICD-10-CM | POA: Diagnosis not present

## 2013-09-01 DIAGNOSIS — Z471 Aftercare following joint replacement surgery: Secondary | ICD-10-CM | POA: Diagnosis not present

## 2013-09-01 DIAGNOSIS — M19049 Primary osteoarthritis, unspecified hand: Secondary | ICD-10-CM | POA: Diagnosis present

## 2013-09-01 DIAGNOSIS — M25549 Pain in joints of unspecified hand: Secondary | ICD-10-CM | POA: Diagnosis not present

## 2013-09-01 DIAGNOSIS — Z8673 Personal history of transient ischemic attack (TIA), and cerebral infarction without residual deficits: Secondary | ICD-10-CM

## 2013-09-01 DIAGNOSIS — M79609 Pain in unspecified limb: Secondary | ICD-10-CM | POA: Diagnosis not present

## 2013-09-01 DIAGNOSIS — R509 Fever, unspecified: Secondary | ICD-10-CM | POA: Diagnosis not present

## 2013-09-01 DIAGNOSIS — S329XXA Fracture of unspecified parts of lumbosacral spine and pelvis, initial encounter for closed fracture: Secondary | ICD-10-CM | POA: Diagnosis not present

## 2013-09-01 DIAGNOSIS — M84369A Stress fracture, unspecified tibia and fibula, initial encounter for fracture: Secondary | ICD-10-CM | POA: Diagnosis present

## 2013-09-01 DIAGNOSIS — S32409A Unspecified fracture of unspecified acetabulum, initial encounter for closed fracture: Secondary | ICD-10-CM | POA: Diagnosis not present

## 2013-09-01 DIAGNOSIS — J449 Chronic obstructive pulmonary disease, unspecified: Secondary | ICD-10-CM | POA: Diagnosis not present

## 2013-09-01 DIAGNOSIS — M25539 Pain in unspecified wrist: Secondary | ICD-10-CM | POA: Diagnosis not present

## 2013-09-01 DIAGNOSIS — S82892A Other fracture of left lower leg, initial encounter for closed fracture: Secondary | ICD-10-CM

## 2013-09-01 DIAGNOSIS — R269 Unspecified abnormalities of gait and mobility: Secondary | ICD-10-CM | POA: Diagnosis not present

## 2013-09-01 DIAGNOSIS — S79919A Unspecified injury of unspecified hip, initial encounter: Secondary | ICD-10-CM | POA: Diagnosis not present

## 2013-09-01 DIAGNOSIS — T148XXA Other injury of unspecified body region, initial encounter: Secondary | ICD-10-CM | POA: Diagnosis not present

## 2013-09-01 DIAGNOSIS — S32509A Unspecified fracture of unspecified pubis, initial encounter for closed fracture: Secondary | ICD-10-CM | POA: Diagnosis not present

## 2013-09-01 DIAGNOSIS — Z7902 Long term (current) use of antithrombotics/antiplatelets: Secondary | ICD-10-CM | POA: Diagnosis not present

## 2013-09-01 DIAGNOSIS — M25579 Pain in unspecified ankle and joints of unspecified foot: Secondary | ICD-10-CM | POA: Diagnosis not present

## 2013-09-01 DIAGNOSIS — I2510 Atherosclerotic heart disease of native coronary artery without angina pectoris: Secondary | ICD-10-CM | POA: Diagnosis not present

## 2013-09-01 DIAGNOSIS — IMO0001 Reserved for inherently not codable concepts without codable children: Secondary | ICD-10-CM | POA: Diagnosis not present

## 2013-09-01 DIAGNOSIS — S42409A Unspecified fracture of lower end of unspecified humerus, initial encounter for closed fracture: Secondary | ICD-10-CM | POA: Diagnosis not present

## 2013-09-01 DIAGNOSIS — S32402A Unspecified fracture of left acetabulum, initial encounter for closed fracture: Secondary | ICD-10-CM

## 2013-09-01 DIAGNOSIS — S52043A Displaced fracture of coronoid process of unspecified ulna, initial encounter for closed fracture: Secondary | ICD-10-CM | POA: Diagnosis not present

## 2013-09-01 DIAGNOSIS — I10 Essential (primary) hypertension: Secondary | ICD-10-CM | POA: Diagnosis not present

## 2013-09-01 DIAGNOSIS — M6281 Muscle weakness (generalized): Secondary | ICD-10-CM | POA: Diagnosis not present

## 2013-09-01 DIAGNOSIS — Z87891 Personal history of nicotine dependence: Secondary | ICD-10-CM | POA: Diagnosis not present

## 2013-09-01 DIAGNOSIS — I739 Peripheral vascular disease, unspecified: Secondary | ICD-10-CM

## 2013-09-01 DIAGNOSIS — Z9889 Other specified postprocedural states: Secondary | ICD-10-CM | POA: Diagnosis not present

## 2013-09-01 DIAGNOSIS — S42453A Displaced fracture of lateral condyle of unspecified humerus, initial encounter for closed fracture: Secondary | ICD-10-CM | POA: Diagnosis not present

## 2013-09-01 DIAGNOSIS — Z96629 Presence of unspecified artificial elbow joint: Secondary | ICD-10-CM | POA: Diagnosis not present

## 2013-09-01 DIAGNOSIS — D649 Anemia, unspecified: Secondary | ICD-10-CM | POA: Diagnosis not present

## 2013-09-01 DIAGNOSIS — R279 Unspecified lack of coordination: Secondary | ICD-10-CM | POA: Diagnosis not present

## 2013-09-01 DIAGNOSIS — Z9181 History of falling: Secondary | ICD-10-CM | POA: Diagnosis not present

## 2013-09-01 DIAGNOSIS — W010XXA Fall on same level from slipping, tripping and stumbling without subsequent striking against object, initial encounter: Secondary | ICD-10-CM | POA: Diagnosis present

## 2013-09-01 DIAGNOSIS — S5290XA Unspecified fracture of unspecified forearm, initial encounter for closed fracture: Secondary | ICD-10-CM | POA: Diagnosis not present

## 2013-09-01 DIAGNOSIS — D62 Acute posthemorrhagic anemia: Secondary | ICD-10-CM | POA: Diagnosis not present

## 2013-09-01 DIAGNOSIS — Z01818 Encounter for other preprocedural examination: Secondary | ICD-10-CM | POA: Diagnosis not present

## 2013-09-01 DIAGNOSIS — S59909A Unspecified injury of unspecified elbow, initial encounter: Secondary | ICD-10-CM | POA: Diagnosis not present

## 2013-09-01 DIAGNOSIS — G8918 Other acute postprocedural pain: Secondary | ICD-10-CM | POA: Diagnosis not present

## 2013-09-01 DIAGNOSIS — S42402A Unspecified fracture of lower end of left humerus, initial encounter for closed fracture: Secondary | ICD-10-CM

## 2013-09-01 LAB — BASIC METABOLIC PANEL
BUN: 19 mg/dL (ref 6–23)
CO2: 24 mEq/L (ref 19–32)
Chloride: 101 mEq/L (ref 96–112)
Creatinine, Ser: 1.14 mg/dL — ABNORMAL HIGH (ref 0.50–1.10)
GFR calc Af Amer: 50 mL/min — ABNORMAL LOW (ref >90)
Potassium: 3.7 mEq/L (ref 3.5–5.1)

## 2013-09-01 LAB — CBC WITH DIFFERENTIAL/PLATELET
Basophils Relative: 1 % (ref 0–1)
HCT: 35.9 % — ABNORMAL LOW (ref 36.0–46.0)
Hemoglobin: 12.4 g/dL (ref 12.0–15.0)
Lymphocytes Relative: 28 % (ref 12–46)
MCHC: 34.5 g/dL (ref 30.0–36.0)
Monocytes Absolute: 0.8 10*3/uL (ref 0.1–1.0)
Monocytes Relative: 10 % (ref 3–12)
Neutro Abs: 5.1 10*3/uL (ref 1.7–7.7)
Neutrophils Relative %: 59 % (ref 43–77)
RBC: 3.95 MIL/uL (ref 3.87–5.11)
WBC: 8.6 10*3/uL (ref 4.0–10.5)

## 2013-09-01 MED ORDER — HYDROCHLOROTHIAZIDE 10 MG/ML ORAL SUSPENSION
6.2500 mg | Freq: Every day | ORAL | Status: DC
  Administered 2013-09-02 – 2013-09-08 (×6): 6.25 mg via ORAL
  Filled 2013-09-01 (×9): qty 1.25

## 2013-09-01 MED ORDER — ACETAMINOPHEN 650 MG RE SUPP: 650.0000 mg | Freq: Four times a day (QID) | RECTAL | Status: DC | PRN

## 2013-09-01 MED ORDER — POLYETHYLENE GLYCOL 3350 17 G PO PACK
17.0000 g | PACK | Freq: Every day | ORAL | Status: DC | PRN
  Administered 2013-09-07: 17 g via ORAL
  Filled 2013-09-01: qty 1

## 2013-09-01 MED ORDER — SODIUM CHLORIDE 0.9 % IV SOLN
INTRAVENOUS | Status: DC
  Administered 2013-09-01: 16:00:00 via INTRAVENOUS

## 2013-09-01 MED ORDER — HYDROMORPHONE HCL PF 1 MG/ML IJ SOLN
0.5000 mg | INTRAMUSCULAR | Status: DC | PRN
  Administered 2013-09-01: 0.5 mg via INTRAVENOUS
  Administered 2013-09-02: 1 mg via INTRAVENOUS
  Administered 2013-09-03: 0.5 mg via INTRAVENOUS
  Filled 2013-09-01 (×3): qty 1

## 2013-09-01 MED ORDER — HYDROCODONE-ACETAMINOPHEN 5-325 MG PO TABS
1.0000 | ORAL_TABLET | ORAL | Status: DC | PRN
  Administered 2013-09-02 (×2): 1 via ORAL
  Administered 2013-09-02 – 2013-09-03 (×4): 2 via ORAL
  Administered 2013-09-05 (×3): 1 via ORAL
  Administered 2013-09-07: 0.5 via ORAL
  Filled 2013-09-01: qty 1
  Filled 2013-09-01 (×2): qty 2
  Filled 2013-09-01 (×2): qty 1
  Filled 2013-09-01 (×2): qty 2
  Filled 2013-09-01 (×3): qty 1

## 2013-09-01 MED ORDER — ONDANSETRON HCL 4 MG/2ML IJ SOLN
4.0000 mg | Freq: Once | INTRAMUSCULAR | Status: AC
  Administered 2013-09-01: 4 mg via INTRAVENOUS
  Filled 2013-09-01: qty 2

## 2013-09-01 MED ORDER — SODIUM CHLORIDE 0.9 % IV SOLN: 250.0000 mL | INTRAVENOUS | Status: DC | PRN

## 2013-09-01 MED ORDER — VALSARTAN-HYDROCHLOROTHIAZIDE 160-12.5 MG PO TABS: 0.5000 | ORAL_TABLET | Freq: Every day | ORAL | Status: DC

## 2013-09-01 MED ORDER — BISACODYL 10 MG RE SUPP: 10.0000 mg | Freq: Every day | RECTAL | Status: DC | PRN

## 2013-09-01 MED ORDER — FENTANYL CITRATE 0.05 MG/ML IJ SOLN
50.0000 ug | INTRAMUSCULAR | Status: AC | PRN
  Administered 2013-09-01 (×4): 50 ug via INTRAVENOUS
  Filled 2013-09-01 (×4): qty 2

## 2013-09-01 MED ORDER — AMITRIPTYLINE HCL 25 MG PO TABS
25.0000 mg | ORAL_TABLET | Freq: Every day | ORAL | Status: DC
  Administered 2013-09-01 – 2013-09-07 (×7): 25 mg via ORAL
  Filled 2013-09-01 (×9): qty 1

## 2013-09-01 MED ORDER — METOPROLOL TARTRATE 50 MG PO TABS
50.0000 mg | ORAL_TABLET | Freq: Two times a day (BID) | ORAL | Status: DC
  Administered 2013-09-01 – 2013-09-08 (×13): 50 mg via ORAL
  Filled 2013-09-01 (×16): qty 1

## 2013-09-01 MED ORDER — SODIUM CHLORIDE 0.9 % IJ SOLN: 3.0000 mL | Freq: Two times a day (BID) | INTRAMUSCULAR | Status: DC

## 2013-09-01 MED ORDER — ONDANSETRON HCL 4 MG PO TABS: 4.0000 mg | ORAL_TABLET | Freq: Four times a day (QID) | ORAL | Status: DC | PRN

## 2013-09-01 MED ORDER — OXYCODONE HCL 5 MG PO TABS
5.0000 mg | ORAL_TABLET | ORAL | Status: DC | PRN
  Administered 2013-09-01: 5 mg via ORAL
  Administered 2013-09-03: 10 mg via ORAL
  Administered 2013-09-04 (×2): 5 mg via ORAL
  Filled 2013-09-01: qty 1
  Filled 2013-09-01: qty 2
  Filled 2013-09-01 (×2): qty 1

## 2013-09-01 MED ORDER — IRBESARTAN 75 MG PO TABS
75.0000 mg | ORAL_TABLET | Freq: Every day | ORAL | Status: DC
  Administered 2013-09-02 – 2013-09-08 (×6): 75 mg via ORAL
  Filled 2013-09-01 (×7): qty 1

## 2013-09-01 MED ORDER — ACETAMINOPHEN 325 MG PO TABS
650.0000 mg | ORAL_TABLET | Freq: Four times a day (QID) | ORAL | Status: DC | PRN
  Administered 2013-09-04 – 2013-09-08 (×12): 650 mg via ORAL
  Filled 2013-09-01 (×13): qty 2

## 2013-09-01 MED ORDER — SODIUM CHLORIDE 0.9 % IJ SOLN: 3.0000 mL | INTRAMUSCULAR | Status: DC | PRN

## 2013-09-01 MED ORDER — LIDOCAINE HCL 2 % IJ SOLN
INTRAMUSCULAR | Status: AC
  Filled 2013-09-01: qty 20

## 2013-09-01 MED ORDER — SODIUM CHLORIDE 0.9 % IV SOLN
INTRAVENOUS | Status: DC
  Administered 2013-09-01 – 2013-09-02 (×2): via INTRAVENOUS

## 2013-09-01 MED ORDER — ONDANSETRON HCL 4 MG/2ML IJ SOLN
4.0000 mg | Freq: Four times a day (QID) | INTRAMUSCULAR | Status: DC | PRN
Start: 2013-09-01 — End: 2013-09-03

## 2013-09-01 NOTE — ED Notes (Signed)
Bed: WA03 Expected date:  Expected time:  Means of arrival:  Comments: EMS_fall 

## 2013-09-01 NOTE — H&P (Signed)
[name] is an 79 y.o. female.   Chief Complaint: Pain in Left Hip,Left Elbow and Left Ankle. HPI: She fell at a [facility] Today and injured her Left Elbow and Left Hip and Left Ankle. She is on Plavix for treatment of her Arterial Stents in her Left Leg.  Past Medical History  Diagnosis Date  . Hypertension   . Stroke     History of TIA  . History of angina   . Peripheral vascular disease     Past Surgical History  Procedure Laterality Date  . Abdominal hysterectomy    . Pr vein bypass graft,aorto-fem-pop  09-13-09    Left Fem-pop  . Joint replacement      knee  . Femoral artery stent  12-11-10    Left SFA    No family history on file. Social History:  reports that she quit smoking about 66 years ago. Her smoking use included Cigarettes. She smoked 0.00 packs per day. She has never used smokeless tobacco. She reports that she does not drink alcohol or use illicit drugs.  Allergies:  Allergies  Allergen Reactions  . Promethazine Hcl     IV  Drug only, unknown   . Sulfa Antibiotics Other (See Comments)    Unknown      (Not in a hospital admission)  Results for orders placed during the hospital encounter of 09/01/13 (from the past 48 hour(s))  CBC WITH DIFFERENTIAL     Status: Abnormal   Collection Time    09/01/13  4:00 PM      Result Value Range   WBC 8.6  4.0 - 10.5 K/uL   RBC 3.95  3.87 - 5.11 MIL/uL   Hemoglobin 12.4  12.0 - 15.0 g/dL   HCT 16.1 (*) 09.6 - 04.5 %   MCV 90.9  78.0 - 100.0 fL   MCH 31.4  26.0 - 34.0 pg   MCHC 34.5  30.0 - 36.0 g/dL   RDW 40.9  81.1 - 91.4 %   Platelets 216  150 - 400 K/uL   Neutrophils Relative % 59  43 - 77 %   Neutro Abs 5.1  1.7 - 7.7 K/uL   Lymphocytes Relative 28  12 - 46 %   Lymphs Abs 2.4  0.7 - 4.0 K/uL   Monocytes Relative 10  3 - 12 %   Monocytes Absolute 0.8  0.1 - 1.0 K/uL   Eosinophils Relative 3  0 - 5 %   Eosinophils Absolute 0.3  0.0 - 0.7 K/uL   Basophils Relative 1  0 - 1 %   Basophils  Absolute 0.1  0.0 - 0.1 K/uL  BASIC METABOLIC PANEL     Status: Abnormal   Collection Time    09/01/13  4:00 PM      Result Value Range   Sodium 135  135 - 145 mEq/L   Potassium 3.7  3.5 - 5.1 mEq/L   Chloride 101  96 - 112 mEq/L   CO2 24  19 - 32 mEq/L   Glucose, Bld 115 (*) 70 - 99 mg/dL   BUN 19  6 - 23 mg/dL   Creatinine, Ser 7.82 (*) 0.50 - 1.10 mg/dL   Calcium 9.0  8.4 - 95.6 mg/dL   GFR calc non Af Amer 44 (*) >90 mL/min   GFR calc Af Amer 50 (*) >90 mL/min   Comment: (NOTE)     The eGFR has been calculated using the CKD EPI equation.  This calculation has not been validated in all clinical situations.     eGFR's persistently <90 mL/min signify possible Chronic Kidney     Disease.  PROTIME-INR     Status: None   Collection Time    09/01/13  4:00 PM      Result Value Range   Prothrombin Time 12.1  11.6 - 15.2 seconds   INR 0.91  0.00 - 1.49   Dg Elbow Complete Left  09/01/2013   CLINICAL DATA:  Severe left elbow pain after a fall.  EXAM: LEFT ELBOW - COMPLETE 3+ VIEW  COMPARISON:  None.  FINDINGS: The patient has a comminuted intercondylar fracture of the left humerus. There is lateral displacement of fracture fragments and the radius and ulna appear laterally displaced. Extensive soft tissue swelling and hematoma are seen about the elbow.  IMPRESSION: Comminuted intercondylar fracture of the distal left humerus with lateral displacement of fracture fragments and lateral dislocation of the radius and ulna.   Electronically Signed   By: Drusilla Kanner M.D.   On: 09/01/2013 16:09   Dg Hip Complete Left  09/01/2013   CLINICAL DATA:  Status post fall. Left hip pain.  EXAM: LEFT HIP - COMPLETE 2+ VIEW  COMPARISON:  None.  FINDINGS: The patient has a left acetabular fracture which appears to be remote with callus formation seen about it. 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 Remote left superior and inferior pubic rami fractures are identified. No acute fracture is seen. Both hips  are located.  IMPRESSION: Remote appearing left acetabular fracture with associated acetabular protrusio.  Remote left pubic rami fractures.  No acute finding is identified.   Electronically Signed   By: Drusilla Kanner M.D.   On: 09/01/2013 16:07   Dg Ankle Complete Left  09/01/2013   CLINICAL DATA:  Fall with left ankle pain.  EXAM: LEFT ANKLE COMPLETE - 3+ VIEW  COMPARISON:  05/23/2012  FINDINGS: Osteopenia. Lateral soft tissue swelling. Base of fifth metatarsal and talar dome intact. Subtle lucency on the mortise view through the distal fibula. Not confidently identified on other views.  IMPRESSION: Lateral soft tissue swelling, without definite acute osseous abnormality. Lucency through the distal fibula on the mortise view is not confirmed on other views. If high clinical concern of nondisplaced fibular fracture, consider short term radiographic followup to evaluate for callus deposition.   Electronically Signed   By: Jeronimo Greaves M.D.   On: 09/01/2013 16:47    Review of Systems  Constitutional: Negative.   HENT: Negative.   Eyes: Negative.   Respiratory: Negative.   Cardiovascular: Negative.   Gastrointestinal: Negative.   Genitourinary: Negative.   Musculoskeletal: Positive for falls and joint pain.  Skin: Negative.   Neurological: Negative.   Endo/Heme/Allergies: Negative.     Blood pressure 212/82, pulse 80, temperature 97.9 F (36.6 C), temperature source Oral, resp. rate 20, SpO2 90.00%. Physical Exam  Constitutional: She appears distressed.  HENT:  Head: Normocephalic.  Eyes: Pupils are equal, round, and reactive to light.  Neck: Normal range of motion.  Cardiovascular: Normal rate.   Respiratory: Effort normal.  GI: Soft.  Musculoskeletal:       Right forearm: She exhibits tenderness, swelling and deformity.       Arms:      Legs:      Feet:     Assessment/Plan Local Anesthetic in Left Elbow and Closed Reduction and Posterior Splint. Jones Dressing to Left  Ankle.Admit. Dr. Carola Frost called.I spoke with Vascular and they said  just to hold Plavix and she does not need to be bridged with Lovenox.  Ellyse Rotolo A 09/01/2013, 6:38 PM

## 2013-09-01 NOTE — ED Notes (Addendum)
Pt presents with EMS with c/o a fall that occurred about 30 minutes ago. Pt has significant left arm swelling around the elbow area and is also c/o of some left hip pain. Per EMS, no deformity noted to hip. Pt is on Plavix.

## 2013-09-01 NOTE — ED Provider Notes (Signed)
CSN: [id number]     Arrival date & time 09/01/13  1507 History   First MD Initiated Contact with Patient 09/01/13 1510     Chief Complaint  Patient presents with  . Fall  . Arm Pain  . Hip Pain   (Consider location/radiation/quality/duration/timing/severity/associated sxs/prior Treatment) HPI Comments: [name] is a 79 y.o. female who presents for evaluation of a fall. She walked outside, felt the sun in her eyes and missed a step while walking. She was unable to get up after the fall. She developed pain in the left elbow, left hip and left ankle. There is no loss of consciousness. She feels like she bumped her head. She denies blurred vision, nausea, vomiting, chest pain, abdominal pain, weakness, or dizziness. She does this morning, but not much. She was walking with her husband, when she fell. She has had trouble walking, like this previously due to visual difficulty. There were no other injuries. There are no other known modifying factors   Patient is a 79 y.o. female presenting with fall, arm pain, and hip pain. The history is provided by the patient.  Fall  Arm Pain  Hip Pain    Past Medical History  Diagnosis Date  . Hypertension   . Stroke     History of TIA  . History of angina   . Peripheral vascular disease    Past Surgical History  Procedure Laterality Date  . Abdominal hysterectomy    . Pr vein bypass graft,aorto-fem-pop  09-13-09    Left Fem-pop  . Joint replacement      knee  . Femoral artery stent  12-11-10    Left SFA   No family history on file. History  Substance Use Topics  . Smoking status: Former Smoker    Types: Cigarettes    Quit date: 11/17/1946  . Smokeless tobacco: Never Used  . Alcohol Use: No   OB History   Grav Para Term Preterm Abortions TAB SAB Ect Mult Living                 Review of Systems  All other systems reviewed and are negative.    Allergies  Promethazine hcl and Sulfa antibiotics  Home Medications  No  current outpatient prescriptions on file. BP 159/58  Pulse 75  Temp(Src) 98 F (36.7 C) (Oral)  Resp 16  SpO2 96% Physical Exam  Nursing note and vitals reviewed. Constitutional: She is oriented to person, place, and time. She appears well-developed.  Elderly, frail  HENT:  Head: Normocephalic and atraumatic.  No contusion or tenderness of the scalp  Eyes: Conjunctivae and EOM are normal. Pupils are equal, round, and reactive to light.  Neck: Normal range of motion and phonation normal. Neck supple.  Cardiovascular: Normal rate, regular rhythm and intact distal pulses.   Pulmonary/Chest: Effort normal and breath sounds normal. She exhibits no tenderness.  Abdominal: Soft. She exhibits no distension. There is no tenderness. There is no guarding.  Musculoskeletal: Normal range of motion.  Left elbow is tender and soft with moderate ecchymosis posteriorly and laterally. The elbow appears deformed. She is neurovascularly intact distally in the left hand. There is no left wrist or hand swelling or deformity. There is no left shoulder, tenderness, or swelling. There is mild tenderness of the left hip. No deformity. There is minimal tenderness of left lateral ankle, and there is no deformity or swelling.  Neurological: She is alert and oriented to person, place, and time. She exhibits normal  muscle tone.  Skin: Skin is warm and dry.  Psychiatric: She has a normal mood and affect. Her behavior is normal. Judgment and thought content normal.    ED Course  Procedures (including critical care time)  Medications  0.9 %  sodium chloride infusion ( Intravenous Stopped 09/01/13 1930)  lidocaine (XYLOCAINE) 2 % (with pres) injection (not administered)  fentaNYL (SUBLIMAZE) injection 50 mcg (50 mcg Intravenous Given 09/01/13 2138)  ondansetron (ZOFRAN) injection 4 mg (4 mg Intravenous Given 09/01/13 1618)    Patient Vitals for the past 24 hrs:  BP Temp Temp src Pulse Resp SpO2  09/01/13 2129  159/58 mmHg 98 F (36.7 C) Oral 75 16 96 %  09/01/13 1521 212/82 mmHg 97.9 F (36.6 C) Oral 80 20 90 %  09/01/13 1511 - - - - - 98 %    I discussed the case with her orthopedist, of choice. Dr Darrelyn Hillock came to the ED to see, evaluate and treat the left elbow and left ankle fracture. He admitted the patient   Labs Review Labs Reviewed  CBC WITH DIFFERENTIAL - Abnormal; Notable for the following:    HCT 35.9 (*)    All other components within normal limits  BASIC METABOLIC PANEL - Abnormal; Notable for the following:    Glucose, Bld 115 (*)    Creatinine, Ser 1.14 (*)    GFR calc non Af Amer 44 (*)    GFR calc Af Amer 50 (*)    All other components within normal limits  URINE CULTURE  PROTIME-INR  URINALYSIS, ROUTINE W REFLEX MICROSCOPIC   Imaging Review Dg Elbow 2 Views Left  09/01/2013   CLINICAL DATA:  Elbow fracture post reduction.  EXAM: LEFT ELBOW - 2 VIEW  COMPARISON:  09/01/2013 earlier in the day  FINDINGS: Comminuted intercondylar fracture of the distal left humerus with lateral displacement of fracture fragments, and lateral dislocation of the radius and ulna. Significant displacement remains following application of an posterior splint.  IMPRESSION: As above.   Electronically Signed   By: Davonna Belling M.D.   On: 09/01/2013 19:23   Dg Elbow Complete Left  09/01/2013   CLINICAL DATA:  Severe left elbow pain after a fall.  EXAM: LEFT ELBOW - COMPLETE 3+ VIEW  COMPARISON:  None.  FINDINGS: The patient has a comminuted intercondylar fracture of the left humerus. There is lateral displacement of fracture fragments and the radius and ulna appear laterally displaced. Extensive soft tissue swelling and hematoma are seen about the elbow.  IMPRESSION: Comminuted intercondylar fracture of the distal left humerus with lateral displacement of fracture fragments and lateral dislocation of the radius and ulna.   Electronically Signed   By: Drusilla Kanner M.D.   On: 09/01/2013 16:09   Dg  Hip Complete Left  09/01/2013   CLINICAL DATA:  Status post fall. Left hip pain.  EXAM: LEFT HIP - COMPLETE 2+ VIEW  COMPARISON:  None.  FINDINGS: The patient has a left acetabular fracture which appears to be remote with callus formation seen about it. 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 Remote left superior and inferior pubic rami fractures are identified. No acute fracture is seen. Both hips are located.  IMPRESSION: Remote appearing left acetabular fracture with associated acetabular protrusio.  Remote left pubic rami fractures.  No acute finding is identified.   Electronically Signed   By: Drusilla Kanner M.D.   On: 09/01/2013 16:07   Dg Ankle Complete Left  09/01/2013   CLINICAL DATA:  Fall with  left ankle pain.  EXAM: LEFT ANKLE COMPLETE - 3+ VIEW  COMPARISON:  05/23/2012  FINDINGS: Osteopenia. Lateral soft tissue swelling. Base of fifth metatarsal and talar dome intact. Subtle lucency on the mortise view through the distal fibula. Not confidently identified on other views.  IMPRESSION: Lateral soft tissue swelling, without definite acute osseous abnormality. Lucency through the distal fibula on the mortise view is not confirmed on other views. If high clinical concern of nondisplaced fibular fracture, consider short term radiographic followup to evaluate for callus deposition.   Electronically Signed   By: Jeronimo Greaves M.D.   On: 09/01/2013 16:47   Ct Elbow Left W/o Cm  09/01/2013   CLINICAL DATA:  Larey Seat, pain and swelling.  EXAM: CT OF THE LEFT ELBOW WITHOUT CONTRAST  TECHNIQUE: Multidetector CT imaging was performed according to the standard protocol. Multiplanar CT image reconstructions were also generated.  COMPARISON:  Plain films earlier in the day.  FINDINGS: Image quality is reduced because the patient is scanned through a posterior cast.  Comminuted intercondylar fracture left humerus. Lateral displacement of the lateral condyle which roughly articulates with the radial head. Comminution  of the distal humerus and medial condyle. Mildly displaced fracture coronoid process. No definite radial head or ulnar shaft/olecranon fracture.  Extensive soft tissue swelling likely representing hematoma. There is air in the posterior soft tissues/joint space but I do not see a laceration; this may be from local anesthetic.  IMPRESSION: Comminuted intercondylar fracture of the left humerus as described. Mildly displaced fracture of the coronoid process. The lateral condyle appears to maintain rough articulation with the radial head.   Electronically Signed   By: Davonna Belling M.D.   On: 09/01/2013 20:42    EKG Interpretation     Ventricular Rate:    PR Interval:    QRS Duration:   QT Interval:    QTC Calculation:   R Axis:     Text Interpretation:              MDM   1. Fracture of elbow, left, closed, initial encounter   2. Fibula fracture, left, closed, initial encounter   3. Hip injury, left, initial encounter    Mechanical fall with fractures of left elbow and left ankle. Apparent contusion to the left hip. I was unable to ambulate the patient to assess her left hip for evidence of occult fracture. Likely need to be assessed at a later date.   Nursing Notes Reviewed/ Care Coordinated, and agree without changes. Applicable Imaging Reviewed.  Interpretation of Laboratory Data incorporated into ED treatment  Plan: Admit to orthopedic service    Flint Melter, MD 09/01/13 2202

## 2013-09-01 NOTE — ED Notes (Signed)
Orthopedic surgery at bedside

## 2013-09-02 ENCOUNTER — Encounter (HOSPITAL_COMMUNITY): Payer: Self-pay

## 2013-09-02 ENCOUNTER — Inpatient Hospital Stay (HOSPITAL_COMMUNITY): Payer: Medicare Other

## 2013-09-02 ENCOUNTER — Other Ambulatory Visit: Payer: Self-pay | Admitting: Orthopedic Surgery

## 2013-09-02 DIAGNOSIS — Z01818 Encounter for other preprocedural examination: Secondary | ICD-10-CM | POA: Diagnosis not present

## 2013-09-02 DIAGNOSIS — S32409A Unspecified fracture of unspecified acetabulum, initial encounter for closed fracture: Secondary | ICD-10-CM | POA: Diagnosis not present

## 2013-09-02 DIAGNOSIS — S32509A Unspecified fracture of unspecified pubis, initial encounter for closed fracture: Secondary | ICD-10-CM | POA: Diagnosis not present

## 2013-09-02 DIAGNOSIS — D62 Acute posthemorrhagic anemia: Secondary | ICD-10-CM | POA: Diagnosis not present

## 2013-09-02 DIAGNOSIS — I739 Peripheral vascular disease, unspecified: Secondary | ICD-10-CM | POA: Diagnosis not present

## 2013-09-02 DIAGNOSIS — S42413A Displaced simple supracondylar fracture without intercondylar fracture of unspecified humerus, initial encounter for closed fracture: Secondary | ICD-10-CM | POA: Diagnosis not present

## 2013-09-02 DIAGNOSIS — J449 Chronic obstructive pulmonary disease, unspecified: Secondary | ICD-10-CM | POA: Diagnosis not present

## 2013-09-02 HISTORY — PX: JOINT REPLACEMENT: SHX530

## 2013-09-02 LAB — URINALYSIS, ROUTINE W REFLEX MICROSCOPIC
Bilirubin Urine: NEGATIVE
Glucose, UA: NEGATIVE mg/dL
Hgb urine dipstick: NEGATIVE
Ketones, ur: NEGATIVE mg/dL
Leukocytes, UA: NEGATIVE
Nitrite: NEGATIVE
Protein, ur: NEGATIVE mg/dL
Specific Gravity, Urine: 1.013 (ref 1.005–1.030)
Urobilinogen, UA: 0.2 mg/dL (ref 0.0–1.0)
pH: 5.5 (ref 5.0–8.0)

## 2013-09-02 LAB — CBC WITH DIFFERENTIAL/PLATELET
Basophils Absolute: 0 10*3/uL (ref 0.0–0.1)
Basophils Relative: 0 % (ref 0–1)
Eosinophils Absolute: 0.1 10*3/uL (ref 0.0–0.7)
Eosinophils Relative: 1 % (ref 0–5)
HCT: 32 % — ABNORMAL LOW (ref 36.0–46.0)
Hemoglobin: 10.9 g/dL — ABNORMAL LOW (ref 12.0–15.0)
Lymphocytes Relative: 16 % (ref 12–46)
Lymphs Abs: 1.4 10*3/uL (ref 0.7–4.0)
MCH: 31.1 pg (ref 26.0–34.0)
MCHC: 34.1 g/dL (ref 30.0–36.0)
MCV: 91.4 fL (ref 78.0–100.0)
Monocytes Absolute: 1.1 10*3/uL — ABNORMAL HIGH (ref 0.1–1.0)
Monocytes Relative: 12 % (ref 3–12)
Neutro Abs: 6.4 10*3/uL (ref 1.7–7.7)
Neutrophils Relative %: 71 % (ref 43–77)
Platelets: 197 10*3/uL (ref 150–400)
RBC: 3.5 MIL/uL — ABNORMAL LOW (ref 3.87–5.11)
RDW: 13.3 % (ref 11.5–15.5)
WBC: 9 10*3/uL (ref 4.0–10.5)

## 2013-09-02 LAB — PROTIME-INR
INR: 1.01 (ref 0.00–1.49)
Prothrombin Time: 13.1 seconds (ref 11.6–15.2)

## 2013-09-02 LAB — COMPREHENSIVE METABOLIC PANEL
ALT: 17 U/L (ref 0–35)
AST: 27 U/L (ref 0–37)
Albumin: 3.6 g/dL (ref 3.5–5.2)
Alkaline Phosphatase: 71 U/L (ref 39–117)
BUN: 16 mg/dL (ref 6–23)
CO2: 23 mEq/L (ref 19–32)
Calcium: 8.5 mg/dL (ref 8.4–10.5)
Chloride: 103 mEq/L (ref 96–112)
Creatinine, Ser: 1.05 mg/dL (ref 0.50–1.10)
GFR calc Af Amer: 56 mL/min — ABNORMAL LOW (ref >90)
GFR calc non Af Amer: 48 mL/min — ABNORMAL LOW (ref >90)
Glucose, Bld: 131 mg/dL — ABNORMAL HIGH (ref 70–99)
Potassium: 4 mEq/L (ref 3.5–5.1)
Sodium: 136 mEq/L (ref 135–145)
Total Bilirubin: 0.7 mg/dL (ref 0.3–1.2)
Total Protein: 6.5 g/dL (ref 6.0–8.3)

## 2013-09-02 LAB — SURGICAL PCR SCREEN
MRSA, PCR: POSITIVE — AB
Staphylococcus aureus: POSITIVE — AB

## 2013-09-02 LAB — APTT: aPTT: 26 seconds (ref 24–37)

## 2013-09-02 MED ORDER — MUPIROCIN 2 % EX OINT
1.0000 "application " | TOPICAL_OINTMENT | Freq: Two times a day (BID) | CUTANEOUS | Status: AC
  Administered 2013-09-03 – 2013-09-07 (×9): 1 via NASAL
  Filled 2013-09-02 (×2): qty 22

## 2013-09-02 MED ORDER — CHLORHEXIDINE GLUCONATE 4 % EX LIQD
60.0000 mL | Freq: Once | CUTANEOUS | Status: DC
  Filled 2013-09-02: qty 60

## 2013-09-02 MED ORDER — CEFAZOLIN SODIUM-DEXTROSE 2-3 GM-% IV SOLR
2.0000 g | INTRAVENOUS | Status: AC
  Administered 2013-09-03: 2 g via INTRAVENOUS
  Filled 2013-09-02 (×2): qty 50

## 2013-09-02 MED ORDER — CHLORHEXIDINE GLUCONATE CLOTH 2 % EX PADS
6.0000 | MEDICATED_PAD | Freq: Every day | CUTANEOUS | Status: AC
  Administered 2013-09-04 – 2013-09-07 (×4): 6 via TOPICAL

## 2013-09-02 MED ORDER — SODIUM CHLORIDE 0.45 % IV SOLN
INTRAVENOUS | Status: DC
  Administered 2013-09-03 (×2): via INTRAVENOUS

## 2013-09-02 MED ORDER — CHLORHEXIDINE GLUCONATE CLOTH 2 % EX PADS: 6.0000 | MEDICATED_PAD | Freq: Every day | CUTANEOUS | Status: DC

## 2013-09-02 MED ORDER — MUPIROCIN 2 % EX OINT: 1.0000 "application " | TOPICAL_OINTMENT | Freq: Two times a day (BID) | CUTANEOUS | Status: DC

## 2013-09-02 MED ORDER — FAMOTIDINE 10 MG PO TABS
10.0000 mg | ORAL_TABLET | Freq: Two times a day (BID) | ORAL | Status: DC | PRN
  Administered 2013-09-02 – 2013-09-07 (×2): 10 mg via ORAL
  Filled 2013-09-02 (×2): qty 1

## 2013-09-02 NOTE — Care Management Note (Signed)
    Page 1 of 1   09/02/2013     5:02:59 PM   CARE MANAGEMENT NOTE 09/02/2013  Patient:  ZACARI, RADICK   Account Number:  [id number]  Date Initiated:  09/02/2013  Documentation initiated by:  Colleen Can  Subjective/Objective Assessment:   dx comminuted intercondylar fx left humer, dislocation radius and ulna     Action/Plan:   Pt will transfer to [facility] tomorrow for surgery.,   Anticipated DC Date:     Anticipated DC Plan:  ACUTE TO ACUTE TRANS      DC Planning Services  CM consult      Choice offered to / List presented to:             Status of service:  Completed, signed off Medicare Important Message given?   (If response is "NO", the following Medicare IM given date fields will be blank) Date Medicare IM given:   Date Additional Medicare IM given:    Discharge Disposition:    Per UR Regulation:  Reviewed for med. necessity/level of care/duration of stay  If discussed at Long Length of Stay Meetings, dates discussed:    Comments:

## 2013-09-02 NOTE — Progress Notes (Signed)
Utilization review completed.  

## 2013-09-02 NOTE — Consult Note (Signed)
Reason for Consult: Fracture dislocation left elbow Referring Physician: Marga Gramajo is an 79 y.o. female.  HPI: Patient presents after a fall with fracture to her pelvis as well as a comminuted complex left elbow fracture dislocation. I was asked to see and treat the left elbow do to the complexity. The patient is with her family at bedside. She describes left elbow pain. She has pre-existing arthritis in her hands. She's had previous thumb joint replacement by myself on the left side. The patient notes no neck or chest pain. She is been on plavix  but is now off. She is alert and oriented. She complains of pelvic pain in her elbow and pelvis. I discussed her care with Dr. Darrelyn Hillock.  She has pre-existing right shoulder arthritis secondary to an injury and loss of motion. She has significant arthritis in her hands limiting her motion. She states she also has pre-existing numbness but no new numbness in the extremity at present time.   Past Medical History  Diagnosis Date  . Hypertension   . Stroke     History of TIA  . History of angina   . Peripheral vascular disease     Past Surgical History  Procedure Laterality Date  . Abdominal hysterectomy    . Pr vein bypass graft,aorto-fem-pop  09-13-09    Left Fem-pop  . Joint replacement      knee  . Femoral artery stent  12-11-10    Left SFA    History reviewed. No pertinent family history.  Social History:  reports that she quit smoking about 66 years ago. Her smoking use included Cigarettes. She smoked 0.00 packs per day. She has never used smokeless tobacco. She reports that she does not drink alcohol or use illicit drugs.  Allergies:  Allergies  Allergen Reactions  . Promethazine Hcl     IV  Drug only, unknown   . Sulfa Antibiotics Other (See Comments)    Unknown     Medications: I have reviewed the patient's current medications.  Results for orders placed during the hospital encounter of 09/01/13 (from the past  48 hour(s))  CBC WITH DIFFERENTIAL     Status: Abnormal   Collection Time    09/01/13  4:00 PM      Result Value Range   WBC 8.6  4.0 - 10.5 K/uL   RBC 3.95  3.87 - 5.11 MIL/uL   Hemoglobin 12.4  12.0 - 15.0 g/dL   HCT 16.1 (*) 09.6 - 04.5 %   MCV 90.9  78.0 - 100.0 fL   MCH 31.4  26.0 - 34.0 pg   MCHC 34.5  30.0 - 36.0 g/dL   RDW 40.9  81.1 - 91.4 %   Platelets 216  150 - 400 K/uL   Neutrophils Relative % 59  43 - 77 %   Neutro Abs 5.1  1.7 - 7.7 K/uL   Lymphocytes Relative 28  12 - 46 %   Lymphs Abs 2.4  0.7 - 4.0 K/uL   Monocytes Relative 10  3 - 12 %   Monocytes Absolute 0.8  0.1 - 1.0 K/uL   Eosinophils Relative 3  0 - 5 %   Eosinophils Absolute 0.3  0.0 - 0.7 K/uL   Basophils Relative 1  0 - 1 %   Basophils Absolute 0.1  0.0 - 0.1 K/uL  BASIC METABOLIC PANEL     Status: Abnormal   Collection Time    09/01/13  4:00 PM  Result Value Range   Sodium 135  135 - 145 mEq/L   Potassium 3.7  3.5 - 5.1 mEq/L   Chloride 101  96 - 112 mEq/L   CO2 24  19 - 32 mEq/L   Glucose, Bld 115 (*) 70 - 99 mg/dL   BUN 19  6 - 23 mg/dL   Creatinine, Ser 9.60 (*) 0.50 - 1.10 mg/dL   Calcium 9.0  8.4 - 45.4 mg/dL   GFR calc non Af Amer 44 (*) >90 mL/min   GFR calc Af Amer 50 (*) >90 mL/min   Comment: (NOTE)     The eGFR has been calculated using the CKD EPI equation.     This calculation has not been validated in all clinical situations.     eGFR's persistently <90 mL/min signify possible Chronic Kidney     Disease.  PROTIME-INR     Status: None   Collection Time    09/01/13  4:00 PM      Result Value Range   Prothrombin Time 12.1  11.6 - 15.2 seconds   INR 0.91  0.00 - 1.49  URINALYSIS, ROUTINE W REFLEX MICROSCOPIC     Status: None   Collection Time    09/01/13 11:39 PM      Result Value Range   Color, Urine YELLOW  YELLOW   APPearance CLEAR  CLEAR   Specific Gravity, Urine 1.013  1.005 - 1.030   pH 5.5  5.0 - 8.0   Glucose, UA NEGATIVE  NEGATIVE mg/dL   Hgb urine dipstick  NEGATIVE  NEGATIVE   Bilirubin Urine NEGATIVE  NEGATIVE   Ketones, ur NEGATIVE  NEGATIVE mg/dL   Protein, ur NEGATIVE  NEGATIVE mg/dL   Urobilinogen, UA 0.2  0.0 - 1.0 mg/dL   Nitrite NEGATIVE  NEGATIVE   Leukocytes, UA NEGATIVE  NEGATIVE   Comment: MICROSCOPIC NOT DONE ON URINES WITH NEGATIVE PROTEIN, BLOOD, LEUKOCYTES, NITRITE, OR GLUCOSE <1000 mg/dL.  COMPREHENSIVE METABOLIC PANEL     Status: Abnormal   Collection Time    09/02/13  4:15 AM      Result Value Range   Sodium 136  135 - 145 mEq/L   Potassium 4.0  3.5 - 5.1 mEq/L   Chloride 103  96 - 112 mEq/L   CO2 23  19 - 32 mEq/L   Glucose, Bld 131 (*) 70 - 99 mg/dL   BUN 16  6 - 23 mg/dL   Creatinine, Ser 0.98  0.50 - 1.10 mg/dL   Calcium 8.5  8.4 - 11.9 mg/dL   Total Protein 6.5  6.0 - 8.3 g/dL   Albumin 3.6  3.5 - 5.2 g/dL   AST 27  0 - 37 U/L   ALT 17  0 - 35 U/L   Alkaline Phosphatase 71  39 - 117 U/L   Total Bilirubin 0.7  0.3 - 1.2 mg/dL   GFR calc non Af Amer 48 (*) >90 mL/min   GFR calc Af Amer 56 (*) >90 mL/min   Comment: (NOTE)     The eGFR has been calculated using the CKD EPI equation.     This calculation has not been validated in all clinical situations.     eGFR's persistently <90 mL/min signify possible Chronic Kidney     Disease.  CBC WITH DIFFERENTIAL     Status: Abnormal   Collection Time    09/02/13  4:15 AM      Result Value Range   WBC 9.0  4.0 -  10.5 K/uL   RBC 3.50 (*) 3.87 - 5.11 MIL/uL   Hemoglobin 10.9 (*) 12.0 - 15.0 g/dL   HCT 16.1 (*) 09.6 - 04.5 %   MCV 91.4  78.0 - 100.0 fL   MCH 31.1  26.0 - 34.0 pg   MCHC 34.1  30.0 - 36.0 g/dL   RDW 40.9  81.1 - 91.4 %   Platelets 197  150 - 400 K/uL   Neutrophils Relative % 71  43 - 77 %   Neutro Abs 6.4  1.7 - 7.7 K/uL   Lymphocytes Relative 16  12 - 46 %   Lymphs Abs 1.4  0.7 - 4.0 K/uL   Monocytes Relative 12  3 - 12 %   Monocytes Absolute 1.1 (*) 0.1 - 1.0 K/uL   Eosinophils Relative 1  0 - 5 %   Eosinophils Absolute 0.1  0.0 - 0.7 K/uL    Basophils Relative 0  0 - 1 %   Basophils Absolute 0.0  0.0 - 0.1 K/uL  APTT     Status: None   Collection Time    09/02/13  4:15 AM      Result Value Range   aPTT 26  24 - 37 seconds  PROTIME-INR     Status: None   Collection Time    09/02/13  4:15 AM      Result Value Range   Prothrombin Time 13.1  11.6 - 15.2 seconds   INR 1.01  0.00 - 1.49    Dg Elbow 2 Views Left  09/01/2013   CLINICAL DATA:  Elbow fracture post reduction.  EXAM: LEFT ELBOW - 2 VIEW  COMPARISON:  09/01/2013 earlier in the day  FINDINGS: Comminuted intercondylar fracture of the distal left humerus with lateral displacement of fracture fragments, and lateral dislocation of the radius and ulna. Significant displacement remains following application of an posterior splint.  IMPRESSION: As above.   Electronically Signed   By: Davonna Belling M.D.   On: 09/01/2013 19:23   Dg Elbow Complete Left  09/01/2013   CLINICAL DATA:  Severe left elbow pain after a fall.  EXAM: LEFT ELBOW - COMPLETE 3+ VIEW  COMPARISON:  None.  FINDINGS: The patient has a comminuted intercondylar fracture of the left humerus. There is lateral displacement of fracture fragments and the radius and ulna appear laterally displaced. Extensive soft tissue swelling and hematoma are seen about the elbow.  IMPRESSION: Comminuted intercondylar fracture of the distal left humerus with lateral displacement of fracture fragments and lateral dislocation of the radius and ulna.   Electronically Signed   By: Drusilla Kanner M.D.   On: 09/01/2013 16:09   Dg Wrist 2 Views Left  09/02/2013   CLINICAL DATA:  Fall, left wrist pain  EXAM: LEFT WRIST - 2 VIEW  COMPARISON:  Radiographs of the elbow obtained concurrently.  FINDINGS: Fiberglass casting material mildly limits evaluation of fine osseous detail. There are 2 osseous fragments in the soft tissues of the radial aspect of the wrist interposition between the ulnar styloid and base of the thumb. These likely represent  bone chips from an uncertain donor site. The distal radius appears intact. Degenerative changes are noted at the thumb carpometacarpal joint and interphalangeal joints.  IMPRESSION: Bone chips in the medial aspect of the wrist interposition between the ulnar styloid and base of the thumb metacarpal likely represent fracture fragments from an uncertain donor site.  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   Electronically Signed  By: Malachy Moan M.D.   On: 09/02/2013 07:56   Dg Hip Complete Left  09/01/2013   CLINICAL DATA:  Status post fall. Left hip pain.  EXAM: LEFT HIP - COMPLETE 2+ VIEW  COMPARISON:  None.  FINDINGS: The patient has a left acetabular fracture which appears to be remote with callus formation seen about it. 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 Remote left superior and inferior pubic rami fractures are identified. No acute fracture is seen. Both hips are located.  IMPRESSION: Remote appearing left acetabular fracture with associated acetabular protrusio.  Remote left pubic rami fractures.  No acute finding is identified.   Electronically Signed   By: Drusilla Kanner M.D.   On: 09/01/2013 16:07   Dg Ankle Complete Left  09/01/2013   CLINICAL DATA:  Fall with left ankle pain.  EXAM: LEFT ANKLE COMPLETE - 3+ VIEW  COMPARISON:  05/23/2012  FINDINGS: Osteopenia. Lateral soft tissue swelling. Base of fifth metatarsal and talar dome intact. Subtle lucency on the mortise view through the distal fibula. Not confidently identified on other views.  IMPRESSION: Lateral soft tissue swelling, without definite acute osseous abnormality. Lucency through the distal fibula on the mortise view is not confirmed on other views. If high clinical concern of nondisplaced fibular fracture, consider short term radiographic followup to evaluate for callus deposition.   Electronically Signed   By: Jeronimo Greaves M.D.   On: 09/01/2013 16:47   Ct Hip Left Wo Contrast  09/02/2013   CLINICAL  DATA:  Left hip pain status post fall.  EXAM: CT OF THE LEFT HIP WITHOUT CONTRAST  TECHNIQUE: Multidetector CT imaging was performed according to the standard protocol. Multiplanar CT image reconstructions were also generated.  COMPARISON:  Radiographs 09/01/2013.  FINDINGS: Examination is limited to the left hemipelvis and proximal left femur. There is a comminuted acute fracture of the left acetabulum. The acetabular roof demonstrates up to 11 mm of displacement. The medial wall of the left acetabulum is displaced by up to 7 mm. The fracture involves the anterior and posterior walls of the left acetabulum. There is superior extension into the left iliac bone.  There are nondisplaced fractures of the superior and inferior left pubic rami. Although there may be some superimposed posttraumatic deformity from old injuries in this area, acute fracture lines are evident as well. There is old posttraumatic deformity of the right inferior pubic ramus. The symphysis pubis and left sacroiliac joint are intact.  The left femoral head is the medially displaced into the acetabular fracture. There is no evidence of proximal femur fracture or dislocation. A moderate-sized left hip joint effusion is present. There is a small amount of hemorrhage tracking along the left iliac vessels and into the left perirectal fat. There is no focal hematoma. Air noted within the urinary bladder is attributed to the Foley catheter.  IMPRESSION: 1. Acute comminuted and displaced fracture of the left acetabulum as described. There is resulting acute acetabular protrusio without evidence of acute proximal femur injury. 2. Acute fractures of the left superior and inferior pubic rami. No evidence of diastasis of the symphysis pubis or left sacroiliac joint. 3. A small amount of hemorrhage tracks along the iliac vessels into the pelvis. There is no large hematoma.   Electronically Signed   By: Roxy Horseman M.D.   On: 09/02/2013 07:50   Ct Elbow Left  W/o Cm  09/01/2013   CLINICAL DATA:  Larey Seat, pain and swelling.  EXAM: CT OF THE LEFT ELBOW WITHOUT  CONTRAST  TECHNIQUE: Multidetector CT imaging was performed according to the standard protocol. Multiplanar CT image reconstructions were also generated.  COMPARISON:  Plain films earlier in the day.  FINDINGS: Image quality is reduced because the patient is scanned through a posterior cast.  Comminuted intercondylar fracture left humerus. Lateral displacement of the lateral condyle which roughly articulates with the radial head. Comminution of the distal humerus and medial condyle. Mildly displaced fracture coronoid process. No definite radial head or ulnar shaft/olecranon fracture.  Extensive soft tissue swelling likely representing hematoma. There is air in the posterior soft tissues/joint space but I do not see a laceration; this may be from local anesthetic.  IMPRESSION: Comminuted intercondylar fracture of the left humerus as described. Mildly displaced fracture of the coronoid process. The lateral condyle appears to maintain rough articulation with the radial head.   Electronically Signed   By: Davonna Belling M.D.   On: 09/01/2013 20:42   Dg Chest Port 1 View  09/02/2013   CLINICAL DATA:  Preoperative evaluation for left elbow surgery, history hypertension, stroke, peripheral vascular disease  EXAM: PORTABLE CHEST - 1 VIEW  COMPARISON:  09/06/2009  FINDINGS: Upper normal heart size.  Atherosclerotic calcification aorta.  Mediastinal contours and pulmonary vascularity normal.  Linear subsegmental atelectasis left lower lobe.  Lungs emphysematous but clear.  No pleural effusion or pneumothorax.  Bones demineralized with note of a right shoulder prosthesis.  IMPRESSION: COPD changes with linear subsegmental atelectasis left lower lobe.   Electronically Signed   By: Ulyses Southward M.D.   On: 09/02/2013 09:03    Review of Systems  Constitutional: Negative.   HENT: Negative.   Respiratory: Negative.    Cardiovascular: Negative.   Musculoskeletal:       See physical exam and chart  Neurological: Negative.    Blood pressure 121/65, pulse 78, temperature 98.7 F (37.1 C), temperature source Oral, resp. rate 14, height 5\' 2"  (1.575 m), weight 62.6 kg (138 lb 0.1 oz), SpO2 92.00%. Physical Exam she is in a bandage her sensation is intact to light touch about the left elbow and fingers. She has limited motion due to arthritis in her hand. She has no signs of compartment syndrome. She has a thin frail skin for an 79 year old female. She has no evidence of shoulder or neck trauma. Her right shoulder is noted to have limited motion. Her right hand is neurovascularly intact with IV access.  I should note that spent over 30-45 minutes just reviewing her CT and plain film radiograph and do feel that she is a very comminuted fracture. I've reviewed this in detail.  Her lower extremity examination is per Dr. Darrelyn Hillock and consultants. She is without signs of DVT at present time.  Assessment/Plan: Fracture dislocation left elbow.  I would recommend consideration for primary total elbow arthroplasty given the low T. condylar nature and difficulty with achieving any form of fixation with this type of osteoporotic bone. I discussed with her that I would certainly have to recommend skilled nursing placement and note the long recovery that she is in store for her.  Unfortunately a total of arthroplasty carries with it at 5 pound lifting much traction and definitely. We discussed these issues and the limitations of a replacement. Unfortunately I do not feel she is a great candidate for ORIF but we will certainly look intraoperatively to see if things change. The family is well aware of this.  We will obtain additional information and move forward. I'm to plan to schedule  surgery tomorrow in the form of ORIF versus total elbow arthroplasty as necessary.  He was a pleasure to see her today.  Marland Kitchen.We are planning  surgery for your upper extremity. The risk and benefits of surgery include risk of bleeding infection anesthesia damage to normal structures and failure of the surgery to accomplish its intended goals of relieving symptoms and restoring function with this in mind we'll going to proceed. I have specifically discussed with the patient the pre-and postoperative regime and the does and don'ts and risk and benefits in great detail. Risk and benefits of surgery also include risk of dystrophy chronic nerve pain failure of the healing process to go onto completion and other inherent risks of surgery The relavent the pathophysiology of the disease/injury process, as well as the alternatives for treatment and postoperative course of action has been discussed in great detail with the patient who desires to proceed.  We will do everything in our power to help you (the patient) restore function to the upper extremity. Is a pleasure to see this patient today. .. Patient Active Problem List   Diagnosis Date Noted  . Peripheral vascular disease, unspecified 05/10/2012  . Chronic total occlusion of artery of the extremities 01/19/2012    Katoria Yetman III,Mcdonald Reiling M 09/02/2013, 11:18 AM

## 2013-09-02 NOTE — Progress Notes (Addendum)
Subjective:     Patient reports pain as 4 on 0-10 scale Comminuted Fracture of Left Acetabulum and Pubis. Will keep at Bedrest to avoid further Protrusio. Dr Amanda Pea will evaluate today for Total Elbow. We are holding her Plavix.. Small avulsion Fractures of base of thumb.Questionable Fracture of Navicularof Left wrist   Objective: Vital signs in last 24 hours: Temp:  [97.9 F (36.6 C)-99 F (37.2 C)] 99 F (37.2 C) (10/17 0505) Pulse Rate:  [67-82] 82 (10/17 0505) Resp:  [16-20] 18 (10/17 0505) BP: (157-212)/(58-82) 157/80 mmHg (10/17 0505) SpO2:  [90 %-98 %] 96 % (10/17 0505) Weight:  [62.6 kg (138 lb 0.1 oz)] 62.6 kg (138 lb 0.1 oz) (10/16 2159)  Intake/Output from previous day: 10/16 0701 - 10/17 0700 In: 435 [I.V.:435] Out: 850 [Urine:850] Intake/Output this shift:     Recent Labs  09/01/13 1600 09/02/13 0415  HGB 12.4 10.9*    Recent Labs  09/01/13 1600 09/02/13 0415  WBC 8.6 9.0  RBC 3.95 3.50*  HCT 35.9* 32.0*  PLT 216 197    Recent Labs  09/01/13 1600 09/02/13 0415  NA 135 136  K 3.7 4.0  CL 101 103  CO2 24 23  BUN 19 16  CREATININE 1.14* 1.05  GLUCOSE 115* 131*  CALCIUM 9.0 8.5    Recent Labs  09/01/13 1600 09/02/13 0415  INR 0.91 1.01    Neurologically intact Neurovascular intact Dorsiflexion/Plantar flexion intact  Assessment/Plan:     Continue foley due to Fracture Acetabulum and Pubis with Protrusio oc Femoral Head.  Kendrell Lottman A 09/02/2013, 8:00 AM

## 2013-09-03 ENCOUNTER — Encounter (HOSPITAL_COMMUNITY): Payer: Medicare Other | Admitting: Anesthesiology

## 2013-09-03 ENCOUNTER — Encounter (HOSPITAL_COMMUNITY)
Admission: EM | Disposition: A | Discharge status: Skilled Nursing Facility | Payer: Self-pay | Source: Home / Self Care | Attending: Orthopedic Surgery

## 2013-09-03 ENCOUNTER — Encounter (HOSPITAL_COMMUNITY): Payer: Self-pay | Admitting: Certified Registered"

## 2013-09-03 ENCOUNTER — Inpatient Hospital Stay: Admit: 2013-09-03 | Payer: Self-pay | Admitting: Orthopedic Surgery

## 2013-09-03 ENCOUNTER — Inpatient Hospital Stay (HOSPITAL_COMMUNITY): Payer: Medicare Other | Admitting: Anesthesiology

## 2013-09-03 DIAGNOSIS — S52043A Displaced fracture of coronoid process of unspecified ulna, initial encounter for closed fracture: Secondary | ICD-10-CM | POA: Diagnosis not present

## 2013-09-03 DIAGNOSIS — S5400XA Injury of ulnar nerve at forearm level, unspecified arm, initial encounter: Secondary | ICD-10-CM | POA: Diagnosis not present

## 2013-09-03 DIAGNOSIS — S79919A Unspecified injury of unspecified hip, initial encounter: Secondary | ICD-10-CM

## 2013-09-03 DIAGNOSIS — I10 Essential (primary) hypertension: Secondary | ICD-10-CM

## 2013-09-03 DIAGNOSIS — G8918 Other acute postprocedural pain: Secondary | ICD-10-CM | POA: Diagnosis not present

## 2013-09-03 DIAGNOSIS — S52123A Displaced fracture of head of unspecified radius, initial encounter for closed fracture: Secondary | ICD-10-CM | POA: Diagnosis not present

## 2013-09-03 DIAGNOSIS — E785 Hyperlipidemia, unspecified: Secondary | ICD-10-CM | POA: Diagnosis not present

## 2013-09-03 DIAGNOSIS — S32409A Unspecified fracture of unspecified acetabulum, initial encounter for closed fracture: Secondary | ICD-10-CM | POA: Diagnosis not present

## 2013-09-03 DIAGNOSIS — S52023A Displaced fracture of olecranon process without intraarticular extension of unspecified ulna, initial encounter for closed fracture: Secondary | ICD-10-CM | POA: Diagnosis not present

## 2013-09-03 DIAGNOSIS — D62 Acute posthemorrhagic anemia: Secondary | ICD-10-CM | POA: Diagnosis not present

## 2013-09-03 DIAGNOSIS — S42409A Unspecified fracture of lower end of unspecified humerus, initial encounter for closed fracture: Secondary | ICD-10-CM | POA: Diagnosis not present

## 2013-09-03 DIAGNOSIS — I739 Peripheral vascular disease, unspecified: Secondary | ICD-10-CM | POA: Diagnosis not present

## 2013-09-03 DIAGNOSIS — S42413A Displaced simple supracondylar fracture without intercondylar fracture of unspecified humerus, initial encounter for closed fracture: Secondary | ICD-10-CM | POA: Diagnosis not present

## 2013-09-03 HISTORY — PX: TOTAL ELBOW ARTHROPLASTY: SHX812

## 2013-09-03 LAB — URINE CULTURE
Colony Count: NO GROWTH
Culture: NO GROWTH

## 2013-09-03 SURGERY — ARTHROPLASTY, ELBOW, TOTAL
Anesthesia: Regional | Site: Elbow | Laterality: Left | Wound class: Clean

## 2013-09-03 MED ORDER — CALCIUM CHLORIDE 10 % IV SOLN
INTRAVENOUS | Status: DC | PRN
  Administered 2013-09-03: 200 mg via INTRAVENOUS
  Administered 2013-09-03: 100 mg via INTRAVENOUS

## 2013-09-03 MED ORDER — LIDOCAINE HCL (CARDIAC) 20 MG/ML IV SOLN
INTRAVENOUS | Status: DC | PRN
  Administered 2013-09-03: 50 mg via INTRAVENOUS

## 2013-09-03 MED ORDER — VANCOMYCIN HCL IN DEXTROSE 1-5 GM/200ML-% IV SOLN
INTRAVENOUS | Status: AC
  Filled 2013-09-03: qty 200

## 2013-09-03 MED ORDER — FAMOTIDINE 20 MG PO TABS
20.0000 mg | ORAL_TABLET | Freq: Every day | ORAL | Status: DC
  Administered 2013-09-03 – 2013-09-08 (×6): 20 mg via ORAL
  Filled 2013-09-03 (×7): qty 1

## 2013-09-03 MED ORDER — PROPOFOL 10 MG/ML IV BOLUS
INTRAVENOUS | Status: DC | PRN
  Administered 2013-09-03: 60 mg via INTRAVENOUS
  Administered 2013-09-03: 80 mg via INTRAVENOUS
  Administered 2013-09-03: 10 mg via INTRAVENOUS

## 2013-09-03 MED ORDER — PHENYLEPHRINE HCL 10 MG/ML IJ SOLN
INTRAMUSCULAR | Status: DC | PRN
  Administered 2013-09-03 (×2): 140 ug via INTRAVENOUS
  Administered 2013-09-03: 120 ug via INTRAVENOUS

## 2013-09-03 MED ORDER — VITAMIN C 500 MG PO TABS
1000.0000 mg | ORAL_TABLET | Freq: Every day | ORAL | Status: DC
Start: 2013-09-03 — End: 2013-09-08
  Administered 2013-09-03 – 2013-09-08 (×6): 1000 mg via ORAL
  Filled 2013-09-03 (×7): qty 2

## 2013-09-03 MED ORDER — ARTIFICIAL TEARS OP OINT
TOPICAL_OINTMENT | OPHTHALMIC | Status: DC | PRN
  Administered 2013-09-03: 1 via OPHTHALMIC

## 2013-09-03 MED ORDER — HYDROMORPHONE HCL PF 1 MG/ML IJ SOLN: 0.2500 mg | INTRAMUSCULAR | Status: DC | PRN

## 2013-09-03 MED ORDER — VANCOMYCIN HCL IN DEXTROSE 1-5 GM/200ML-% IV SOLN
1000.0000 mg | INTRAVENOUS | Status: AC
  Administered 2013-09-04 – 2013-09-05 (×2): 1000 mg via INTRAVENOUS
  Filled 2013-09-03 (×2): qty 200

## 2013-09-03 MED ORDER — SUCCINYLCHOLINE CHLORIDE 20 MG/ML IJ SOLN
INTRAMUSCULAR | Status: DC | PRN
  Administered 2013-09-03: 40 mg via INTRAVENOUS

## 2013-09-03 MED ORDER — DOCUSATE SODIUM 100 MG PO CAPS
100.0000 mg | ORAL_CAPSULE | Freq: Two times a day (BID) | ORAL | Status: DC
  Administered 2013-09-03 – 2013-09-08 (×10): 100 mg via ORAL
  Filled 2013-09-03 (×10): qty 1

## 2013-09-03 MED ORDER — FENTANYL CITRATE 0.05 MG/ML IJ SOLN
INTRAMUSCULAR | Status: DC | PRN
  Administered 2013-09-03 (×2): 50 ug via INTRAVENOUS
  Administered 2013-09-03 (×2): 25 ug via INTRAVENOUS

## 2013-09-03 MED ORDER — HYDRALAZINE HCL 20 MG/ML IJ SOLN
10.0000 mg | Freq: Four times a day (QID) | INTRAMUSCULAR | Status: DC | PRN
  Administered 2013-09-03: 10 mg via INTRAVENOUS
  Filled 2013-09-03: qty 1

## 2013-09-03 MED ORDER — 0.9 % SODIUM CHLORIDE (POUR BTL) OPTIME
TOPICAL | Status: DC | PRN
  Administered 2013-09-03: 1000 mL

## 2013-09-03 MED ORDER — LACTATED RINGERS IV SOLN
INTRAVENOUS | Status: DC
  Administered 2013-09-03 (×2): via INTRAVENOUS

## 2013-09-03 MED ORDER — ONDANSETRON HCL 4 MG PO TABS: 8.0000 mg | ORAL_TABLET | Freq: Three times a day (TID) | ORAL | Status: DC | PRN

## 2013-09-03 MED ORDER — BACITRACIN-NEOMYCIN-POLYMYXIN 400-5-5000 EX OINT
TOPICAL_OINTMENT | CUTANEOUS | Status: AC
  Filled 2013-09-03: qty 1

## 2013-09-03 MED ORDER — ONDANSETRON HCL 4 MG/2ML IJ SOLN: 4.0000 mg | Freq: Once | INTRAMUSCULAR | Status: DC | PRN

## 2013-09-03 MED ORDER — ONDANSETRON HCL 4 MG/2ML IJ SOLN
INTRAMUSCULAR | Status: DC | PRN
  Administered 2013-09-03: 4 mg via INTRAMUSCULAR

## 2013-09-03 MED ORDER — VANCOMYCIN HCL 1000 MG IV SOLR
1000.0000 mg | INTRAVENOUS | Status: DC | PRN
  Administered 2013-09-03: 1000 mg via INTRAVENOUS

## 2013-09-03 MED ORDER — METOPROLOL TARTRATE 1 MG/ML IV SOLN
INTRAVENOUS | Status: DC | PRN
  Administered 2013-09-03 (×5): 1 mg via INTRAVENOUS

## 2013-09-03 MED ORDER — LACTATED RINGERS IV SOLN
INTRAVENOUS | Status: DC
  Administered 2013-09-04: 19:00:00 via INTRAVENOUS

## 2013-09-03 SURGICAL SUPPLY — 85 items
BANDAGE ELASTIC 4 VELCRO ST LF (GAUZE/BANDAGES/DRESSINGS) ×3 IMPLANT
BANDAGE GAUZE ELAST BULKY 4 IN (GAUZE/BANDAGES/DRESSINGS) ×3 IMPLANT
BLADE LONG MED 31X9 (MISCELLANEOUS) IMPLANT
BLADE SURG 10 STRL SS (BLADE) ×4 IMPLANT
BLADE SURG 15 STRL LF DISP TIS (BLADE) ×2 IMPLANT
BLADE SURG 15 STRL SS (BLADE) ×4
BNDG CMPR 9X4 STRL LF SNTH (GAUZE/BANDAGES/DRESSINGS)
BNDG ESMARK 4X9 LF (GAUZE/BANDAGES/DRESSINGS) ×1 IMPLANT
BONE CEMENT GENTAMICIN (Cement) ×4 IMPLANT
BUR EGG ELITE 5.0 (BURR) ×1 IMPLANT
BUR ROUND FLUTED 4 SOFT TCH (BURR) IMPLANT
CEMENT BONE GENTAMICIN 40 (Cement) ×2 IMPLANT
CEMENT FEMORAL STRL SM 6MM (Cement) ×1 IMPLANT
CEMENT PLUG FEMORAL STRL 8MM (Orthopedic Implant) ×1 IMPLANT
CLOTH BEACON ORANGE TIMEOUT ST (SAFETY) ×1 IMPLANT
CORDS BIPOLAR (ELECTRODE) ×2 IMPLANT
COVER SURGICAL LIGHT HANDLE (MISCELLANEOUS) ×2 IMPLANT
CUFF TOURNIQUET SINGLE 18IN (TOURNIQUET CUFF) ×2 IMPLANT
CUFF TOURNIQUET SINGLE 24IN (TOURNIQUET CUFF) IMPLANT
DRAPE INCISE IOBAN 66X45 STRL (DRAPES) ×2 IMPLANT
DRAPE OEC MINIVIEW 54X84 (DRAPES) IMPLANT
DRAPE SURG 17X23 STRL (DRAPES) ×1 IMPLANT
DRSG ADAPTIC 3X8 NADH LF (GAUZE/BANDAGES/DRESSINGS) ×2 IMPLANT
ELECT REM PT RETURN 9FT ADLT (ELECTROSURGICAL) ×2
ELECTRODE REM PT RTRN 9FT ADLT (ELECTROSURGICAL) ×1 IMPLANT
EVACUATOR 1/8 PVC DRAIN (DRAIN) ×1 IMPLANT
GAUZE XEROFORM 5X9 LF (GAUZE/BANDAGES/DRESSINGS) ×1 IMPLANT
GLOVE BIOGEL M STRL SZ7.5 (GLOVE) ×1 IMPLANT
GLOVE SURG ORTHO 8.0 STRL STRW (GLOVE) ×4 IMPLANT
GOWN PREVENTION PLUS XLARGE (GOWN DISPOSABLE) ×1 IMPLANT
GOWN STRL NON-REIN LRG LVL3 (GOWN DISPOSABLE) ×3 IMPLANT
GOWN STRL REIN XL XLG (GOWN DISPOSABLE) ×4 IMPLANT
HANDPIECE INTERPULSE COAX TIP (DISPOSABLE)
HUMERAL CON SET-HEXALOBULAR (Orthopedic Implant) ×2 IMPLANT
HUMERAL LEFT 4MMX100MM (Orthopedic Implant) ×1 IMPLANT
KIT BASIN OR (CUSTOM PROCEDURE TRAY) ×2 IMPLANT
KIT DISC CONDYLE HEXALOBULAR (Orthopedic Implant) IMPLANT
KIT INSTRUMENTATION CEMENT SM ×1 IMPLANT
KIT OPTIVAC CEM 80GM DBL MIX (Cement) ×1 IMPLANT
KIT ROOM TURNOVER OR (KITS) ×2 IMPLANT
LOOP VESSEL MAXI BLUE (MISCELLANEOUS) ×2 IMPLANT
MANIFOLD NEPTUNE II (INSTRUMENTS) ×2 IMPLANT
NDL HYPO 25GX1X1/2 BEV (NEEDLE) ×1 IMPLANT
NDL STRAIGHT KEITH (NEEDLE) ×1 IMPLANT
NEEDLE HYPO 25GX1X1/2 BEV (NEEDLE) IMPLANT
NEEDLE STRAIGHT KEITH (NEEDLE) ×2 IMPLANT
NOZZLE CEMENT SMALL (Cement) ×2 IMPLANT
NS IRRIG 1000ML POUR BTL (IV SOLUTION) ×2 IMPLANT
PACK ORTHO EXTREMITY (CUSTOM PROCEDURE TRAY) ×2 IMPLANT
PAD ARMBOARD 7.5X6 YLW CONV (MISCELLANEOUS) ×4 IMPLANT
PAD CAST 4YDX4 CTTN HI CHSV (CAST SUPPLIES) ×2 IMPLANT
PADDING CAST COTTON 4X4 STRL (CAST SUPPLIES) ×2
PASSER SUT SWANSON 36MM LOOP (INSTRUMENTS) ×2 IMPLANT
SET HNDPC FAN SPRY TIP SCT (DISPOSABLE) IMPLANT
SPLINT FIBERGLASS 3X35 (CAST SUPPLIES) ×1 IMPLANT
SPONGE GAUZE 4X4 12PLY (GAUZE/BANDAGES/DRESSINGS) ×2 IMPLANT
SPONGE LAP 18X18 X RAY DECT (DISPOSABLE) ×1 IMPLANT
SPONGE LAP 4X18 X RAY DECT (DISPOSABLE) ×2 IMPLANT
SPONGE SCRUB IODOPHOR (GAUZE/BANDAGES/DRESSINGS) ×2 IMPLANT
STAPLER VISISTAT 35W (STAPLE) IMPLANT
SUCTION FRAZIER TIP 10 FR DISP (SUCTIONS) ×2 IMPLANT
SUT BONE WAX W31G (SUTURE) ×2 IMPLANT
SUT ETHIBOND NAB CT1 #1 30IN (SUTURE) IMPLANT
SUT ETHILON 4 0 FS 1 (SUTURE) ×2 IMPLANT
SUT FIBERWIRE #2 38 REV NDL BL (SUTURE) ×4
SUT FIBERWIRE 2-0 18 17.9 3/8 (SUTURE) ×6
SUT PROLENE 3 0 PS 2 (SUTURE) ×2 IMPLANT
SUT VIC AB 2-0 CT1 27 (SUTURE) ×4
SUT VIC AB 2-0 CT1 TAPERPNT 27 (SUTURE) IMPLANT
SUT VIC AB 3-0 FS2 27 (SUTURE) ×2 IMPLANT
SUT VIC AB 3-0 PS1 18 (SUTURE) ×2
SUT VIC AB 3-0 PS1 18XBRD (SUTURE) ×1 IMPLANT
SUT VICRYL 0 CT 1 36IN (SUTURE) ×2 IMPLANT
SUTURE FIBERWR 2-0 18 17.9 3/8 (SUTURE) IMPLANT
SUTURE FIBERWR#2 38 REV NDL BL (SUTURE) ×2 IMPLANT
SYR 3ML LL SCALE MARK (SYRINGE) ×1 IMPLANT
SYR CONTROL 10ML LL (SYRINGE) ×1 IMPLANT
TOWEL OR 17X24 6PK STRL BLUE (TOWEL DISPOSABLE) ×2 IMPLANT
TOWEL OR 17X26 10 PK STRL BLUE (TOWEL DISPOSABLE) ×4 IMPLANT
TOWER CARTRIDGE SMART MIX (DISPOSABLE) ×2 IMPLANT
TRAY FOLEY CATH 16FRSI W/METER (SET/KITS/TRAYS/PACK) IMPLANT
TUBE CONNECTING 12X1/4 (SUCTIONS) ×3 IMPLANT
ULNA LEFT 3MMX75MM (Orthopedic Implant) ×1 IMPLANT
UNDERPAD 30X30 INCONTINENT (UNDERPADS AND DIAPERS) ×2 IMPLANT
WATER STERILE IRR 1000ML POUR (IV SOLUTION) ×2 IMPLANT

## 2013-09-03 NOTE — Op Note (Signed)
See Dictation #[id number] Amanda Pea MD

## 2013-09-03 NOTE — Anesthesia Procedure Notes (Addendum)
Anesthesia Regional Block:  Infraclavicular brachial plexus block  Pre-Anesthetic Checklist: ,, timeout performed, Correct Patient, Correct Site, Correct Laterality, Correct Procedure, Correct Position, site marked, Risks and benefits discussed,  Surgical consent,  Pre-op evaluation,  At surgeon's request and post-op pain management  Laterality: Left  Prep: chloraprep and alcohol swabs       Needles:  Injection technique: Single-shot  Needle Type: Stimulator Needle - 80          Additional Needles:  Procedures: nerve stimulator Infraclavicular brachial plexus block  Nerve Stimulator or Paresthesia:  Response: 0.5 mA, 0.1 ms, 3 cm  Additional Responses:   Narrative:  Start time: 09/03/2013 12:50 PM End time: 09/03/2013 12:55 PM Injection made incrementally with aspirations every 5 mL.  Performed by: Personally  Anesthesiologist: Maren Beach MD   Additional Notes: Pt and daughter accept procedure and risks. 12cc 0.5% Marcaine w/ epi w/o difficulty or discomfort. GES   Procedure Name: Intubation Date/Time: 09/03/2013 12:59 PM Performed by: Jefm Miles E Pre-anesthesia Checklist: Patient identified, Emergency Drugs available, Suction available, Patient being monitored and Timeout performed Patient Re-evaluated:Patient Re-evaluated prior to inductionOxygen Delivery Method: Circle system utilized Preoxygenation: Pre-oxygenation with 100% oxygen Intubation Type: IV induction and Rapid sequence Ventilation: Mask ventilation without difficulty Laryngoscope Size: Mac and 3 Grade View: Grade I Tube type: Oral Tube size: 7.5 mm Number of attempts: 1 Airway Equipment and Method: Stylet and LTA kit utilized Placement Confirmation: ETT inserted through vocal cords under direct vision,  positive ETCO2 and breath sounds checked- equal and bilateral Secured at: 21 cm Tube secured with: Tape Dental Injury: Teeth and Oropharynx as per pre-operative assessment

## 2013-09-03 NOTE — H&P (Signed)
  Patient seen and evaluated Ready for surgery Otoniel Myhand MD

## 2013-09-03 NOTE — Progress Notes (Signed)
Pt stable...transported via [facility] to [facility] [place] [facility] for surgery this morning.  Daughter went with [facility] and pt chart sent.

## 2013-09-03 NOTE — Consult Note (Signed)
[facility] Medical Consultation  TIMBERLYN PICKFORD NWG:[id number] DOB: August 20, 1928 DOA: 09/01/2013 PCP: Pearla Dubonnet, MD   Requesting physician: Dr. Amanda Pea Date of consultation: 09/03/13 Reason for consultation: medical management  Impression/Recommendations Active Problems:   Peripheral vascular disease, unspecified   HTN (hypertension)   HLD (hyperlipidemia)    PVD- per vascular can hold Plavix and she does not need to be bridged with Lovenox.  HTN- continue bb, hctz/avapro, added PRN hydralazine  HLD- stable- follow up outpatient  Multiple fractures s/p total elbow replacement- per ortho  Anemia- ABLA, monitor with labs  Left message for Dr. Kevan Ny  Chief Complaint:   HPI:  Unfortunate 26 you female who fell at a funeral home on 10/16 with resulted injury to her left elbow, left hip and left ankle.   She has a comminuted fracture of her left acetabulum and pubis- she is on bedrest to avoid protrusio.  On 10/18 she underwent a total elbow by Dr. Amanda Pea.  Patient is currently post-op and is unable to give much information, information gathered from chart.   In PACU patient is still sleepy but wakes up and asks a few questions- some SOB, will decrease fluid and place on NRB until anesthesia wears off   Review of Systems:  Unable to do full ROS as patient just out of surgery  Past Medical History  Diagnosis Date  . Hypertension   . Stroke     History of TIA  . History of angina   . Peripheral vascular disease    Past Surgical History  Procedure Laterality Date  . Abdominal hysterectomy    . Pr vein bypass graft,aorto-fem-pop  09-13-09    Left Fem-pop  . Joint replacement      knee  . Femoral artery stent  12-11-10    Left SFA   Social History:  reports that she quit smoking about 66 years ago. Her smoking use included Cigarettes. She smoked 0.00 packs per day. She has never used smokeless tobacco. She reports that she does not drink alcohol or use  illicit drugs.  Allergies  Allergen Reactions  . Promethazine Hcl     IV  Drug only, unknown   . Sulfa Antibiotics Other (See Comments)    Unknown    History reviewed. No pertinent family history.  Prior to Admission medications   Medication Sig Start Date End Date Taking? Authorizing Provider  acetaminophen (TYLENOL) 500 MG tablet Take 1,000 mg by mouth every 6 (six) hours as needed for pain. Patient took this medication for her pain.   Yes Historical Provider, MD  amitriptyline (ELAVIL) 25 MG tablet Take 25 mg by mouth at bedtime.  07/13/13  Yes Historical Provider, MD  aspirin 81 MG tablet Take 81 mg by mouth daily.   Yes Historical Provider, MD  clopidogrel (PLAVIX) 75 MG tablet Take 75 mg by mouth daily.   Yes Historical Provider, MD  HYDROCHLOROTHIAZIDE PO Take by mouth.   Yes Historical Provider, MD  metoprolol (LOPRESSOR) 50 MG tablet Take 50 mg by mouth 2 (two) times daily.  07/13/13  Yes Historical Provider, MD  metoprolol succinate (TOPROL-XL) 25 MG 24 hr tablet Take 50 mg by mouth every morning. Patient took this medication at 8:30 am this morning.   Yes Historical Provider, MD  Multiple Vitamin (MULTIVITAMIN) tablet Take 1 tablet by mouth daily.   Yes Historical Provider, MD  valsartan-hydrochlorothiazide (DIOVAN-HCT) 160-12.5 MG per tablet Take 0.5 tablets by mouth daily.   Yes Historical Provider, MD  Physical Exam: Blood pressure 173/82, pulse 91, temperature 97.4 F (36.3 C), temperature source Oral, resp. rate 25, height 5\' 2"  (1.575 m), weight 62.6 kg (138 lb 0.1 oz), SpO2 91.00%. Filed Vitals:   09/03/13 1624  BP: 173/82  Pulse: 91  Temp:   Resp: 25     General:  sleepy  Eyes: wnl  ENT: wnl  Neck: supple  Cardiovascular: rrr  Respiratory: clear anterior, no wheezing  Abdomen: +BS, soft, NT  Skin: no rashes or lesions  Musculoskeletal: left arm in sling   Labs on Admission:  Basic Metabolic Panel:  Recent Labs Lab 09/01/13 1600  09/02/13 0415  NA 135 136  K 3.7 4.0  CL 101 103  CO2 24 23  GLUCOSE 115* 131*  BUN 19 16  CREATININE 1.14* 1.05  CALCIUM 9.0 8.5   Liver Function Tests:  Recent Labs Lab 09/02/13 0415  AST 27  ALT 17  ALKPHOS 71  BILITOT 0.7  PROT 6.5  ALBUMIN 3.6   No results found for this basename: LIPASE, AMYLASE,  in the last 168 hours No results found for this basename: AMMONIA,  in the last 168 hours CBC:  Recent Labs Lab 09/01/13 1600 09/02/13 0415  WBC 8.6 9.0  NEUTROABS 5.1 6.4  HGB 12.4 10.9*  HCT 35.9* 32.0*  MCV 90.9 91.4  PLT 216 197   Cardiac Enzymes: No results found for this basename: CKTOTAL, CKMB, CKMBINDEX, TROPONINI,  in the last 168 hours BNP: No components found with this basename: POCBNP,  CBG: No results found for this basename: GLUCAP,  in the last 168 hours  Radiological Exams on Admission: Dg Elbow 2 Views Left  09/01/2013   CLINICAL DATA:  Elbow fracture post reduction.  EXAM: LEFT ELBOW - 2 VIEW  COMPARISON:  09/01/2013 earlier in the day  FINDINGS: Comminuted intercondylar fracture of the distal left humerus with lateral displacement of fracture fragments, and lateral dislocation of the radius and ulna. Significant displacement remains following application of an posterior splint.  IMPRESSION: As above.   Electronically Signed   By: Davonna Belling M.D.   On: 09/01/2013 19:23   Dg Wrist 2 Views Left  09/02/2013   CLINICAL DATA:  Fall, left wrist pain  EXAM: LEFT WRIST - 2 VIEW  COMPARISON:  Radiographs of the elbow obtained concurrently.  FINDINGS: Fiberglass casting material mildly limits evaluation of fine osseous detail. There are 2 osseous fragments in the soft tissues of the radial aspect of the wrist interposition between the ulnar styloid and base of the thumb. These likely represent bone chips from an uncertain donor site. The distal radius appears intact. Degenerative changes are noted at the thumb carpometacarpal joint and interphalangeal  joints.  IMPRESSION: Bone chips in the medial aspect of the wrist interposition between the ulnar styloid and base of the thumb metacarpal likely represent fracture fragments from an uncertain donor site.  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   Electronically Signed   By: Malachy Moan M.D.   On: 09/02/2013 07:56   Dg Ankle Complete Left  09/01/2013   CLINICAL DATA:  Fall with left ankle pain.  EXAM: LEFT ANKLE COMPLETE - 3+ VIEW  COMPARISON:  05/23/2012  FINDINGS: Osteopenia. Lateral soft tissue swelling. Base of fifth metatarsal and talar dome intact. Subtle lucency on the mortise view through the distal fibula. Not confidently identified on other views.  IMPRESSION: Lateral soft tissue swelling, without definite acute osseous abnormality. Lucency through the distal fibula on  the mortise view is not confirmed on other views. If high clinical concern of nondisplaced fibular fracture, consider short term radiographic followup to evaluate for callus deposition.   Electronically Signed   By: Jeronimo Greaves M.D.   On: 09/01/2013 16:47   Ct Hip Left Wo Contrast  09/02/2013   CLINICAL DATA:  Left hip pain status post fall.  EXAM: CT OF THE LEFT HIP WITHOUT CONTRAST  TECHNIQUE: Multidetector CT imaging was performed according to the standard protocol. Multiplanar CT image reconstructions were also generated.  COMPARISON:  Radiographs 09/01/2013.  FINDINGS: Examination is limited to the left hemipelvis and proximal left femur. There is a comminuted acute fracture of the left acetabulum. The acetabular roof demonstrates up to 11 mm of displacement. The medial wall of the left acetabulum is displaced by up to 7 mm. The fracture involves the anterior and posterior walls of the left acetabulum. There is superior extension into the left iliac bone.  There are nondisplaced fractures of the superior and inferior left pubic rami. Although there may be some superimposed posttraumatic deformity from  old injuries in this area, acute fracture lines are evident as well. There is old posttraumatic deformity of the right inferior pubic ramus. The symphysis pubis and left sacroiliac joint are intact.  The left femoral head is the medially displaced into the acetabular fracture. There is no evidence of proximal femur fracture or dislocation. A moderate-sized left hip joint effusion is present. There is a small amount of hemorrhage tracking along the left iliac vessels and into the left perirectal fat. There is no focal hematoma. Air noted within the urinary bladder is attributed to the Foley catheter.  IMPRESSION: 1. Acute comminuted and displaced fracture of the left acetabulum as described. There is resulting acute acetabular protrusio without evidence of acute proximal femur injury. 2. Acute fractures of the left superior and inferior pubic rami. No evidence of diastasis of the symphysis pubis or left sacroiliac joint. 3. A small amount of hemorrhage tracks along the iliac vessels into the pelvis. There is no large hematoma.   Electronically Signed   By: Roxy Horseman M.D.   On: 09/02/2013 07:50   Ct Elbow Left W/o Cm  09/01/2013   CLINICAL DATA:  Larey Seat, pain and swelling.  EXAM: CT OF THE LEFT ELBOW WITHOUT CONTRAST  TECHNIQUE: Multidetector CT imaging was performed according to the standard protocol. Multiplanar CT image reconstructions were also generated.  COMPARISON:  Plain films earlier in the day.  FINDINGS: Image quality is reduced because the patient is scanned through a posterior cast.  Comminuted intercondylar fracture left humerus. Lateral displacement of the lateral condyle which roughly articulates with the radial head. Comminution of the distal humerus and medial condyle. Mildly displaced fracture coronoid process. No definite radial head or ulnar shaft/olecranon fracture.  Extensive soft tissue swelling likely representing hematoma. There is air in the posterior soft tissues/joint space but I do  not see a laceration; this may be from local anesthetic.  IMPRESSION: Comminuted intercondylar fracture of the left humerus as described. Mildly displaced fracture of the coronoid process. The lateral condyle appears to maintain rough articulation with the radial head.   Electronically Signed   By: Davonna Belling M.D.   On: 09/01/2013 20:42   Dg Chest Port 1 View  09/02/2013   CLINICAL DATA:  Preoperative evaluation for left elbow surgery, history hypertension, stroke, peripheral vascular disease  EXAM: PORTABLE CHEST - 1 VIEW  COMPARISON:  09/06/2009  FINDINGS: Upper normal heart  size.  Atherosclerotic calcification aorta.  Mediastinal contours and pulmonary vascularity normal.  Linear subsegmental atelectasis left lower lobe.  Lungs emphysematous but clear.  No pleural effusion or pneumothorax.  Bones demineralized with note of a right shoulder prosthesis.  IMPRESSION: COPD changes with linear subsegmental atelectasis left lower lobe.   Electronically Signed   By: Ulyses Southward M.D.   On: 09/02/2013 09:03    EKG: Independently reviewed. NSR, no change from baseline  Time spent: 75 min  VANN, JESSICA [facility] Pager [phone]  If 7PM-7AM, please contact night-coverage [email or web address] Password [facility] 09/03/2013, 4:39 PM

## 2013-09-03 NOTE — Progress Notes (Signed)
ANTIBIOTIC CONSULT NOTE - INITIAL  Pharmacy Consult for vancomycin Indication: surgical prophylaxis  Allergies  Allergen Reactions  . Promethazine Hcl     IV  Drug only, unknown   . Sulfa Antibiotics Other (See Comments)    Unknown     Patient Measurements: Height: 5\' 2"  (157.5 cm) Weight: 138 lb 0.1 oz (62.6 kg) IBW/kg (Calculated) : 50.1 Adjusted Body Weight:   Vital Signs: Temp: 97.5 F (36.4 C) (10/18 1715) Temp src: Oral (10/18 1158) BP: 177/64 mmHg (10/18 1709) Pulse Rate: 84 (10/18 1709) Intake/Output from previous day: 10/17 0701 - 10/18 0700 In: 1884.2 [I.V.:1884.2] Out: 500 [Urine:500] Intake/Output from this shift: Total I/O In: 1200 [I.V.:1200] Out: 690 [Urine:690]  Labs:  Recent Labs  09/01/13 1600 09/02/13 0415  WBC 8.6 9.0  HGB 12.4 10.9*  PLT 216 197  CREATININE 1.14* 1.05   Estimated Creatinine Clearance: 35.9 ml/min (by C-G formula based on Cr of 1.05). No results found for this basename: Rolm Gala, VANCORANDOM, GENTTROUGH, GENTPEAK, GENTRANDOM, TOBRATROUGH, TOBRAPEAK, TOBRARND, AMIKACINPEAK, AMIKACINTROU, AMIKACIN,  in the last 72 hours   Microbiology: Recent Results (from the past 720 hour(s))  URINE CULTURE     Status: None   Collection Time    09/01/13 11:39 PM      Result Value Range Status   Specimen Description URINE, CLEAN CATCH   Final   Special Requests NONE   Final   Culture  Setup Time     Final   Value: 09/02/2013 07:01     Performed at [facility] Count     Final   Value: NO GROWTH     Performed at [facility]   Culture     Final   Value: NO GROWTH     Performed at [facility]   Report Status 09/03/2013 FINAL   Final  SURGICAL PCR SCREEN     Status: Abnormal   Collection Time    09/02/13  5:23 PM      Result Value Range Status   MRSA, PCR POSITIVE (*) NEGATIVE Final   Comment: RESULT CALLED TO, READ BACK BY AND VERIFIED WITH:     S WALTON AT 2244 ON 10.17.2014 BY  NBROOKS   Staphylococcus aureus POSITIVE (*) NEGATIVE Final   Comment:            The Xpert SA Assay (FDA     approved for NASAL specimens     in patients over 56 years of age),     is one component of     a comprehensive surveillance     program.  Test performance has     been validated by [employer] for patients greater     than or equal to 60 year old.     It is not intended     to diagnose infection nor to     guide or monitor treatment.    Medical History: Past Medical History  Diagnosis Date  . Hypertension   . Stroke     History of TIA  . History of angina   . Peripheral vascular disease     Medications:  Scheduled:  . amitriptyline  25 mg Oral QHS  . Chlorhexidine Gluconate Cloth  6 each Topical Q0600  . docusate sodium  100 mg Oral BID  . famotidine  20 mg Oral Daily  . irbesartan  75 mg Oral Daily   And  . hydrochlorothiazide  6.25  mg Oral Daily  . metoprolol  50 mg Oral BID  . mupirocin ointment  1 application Nasal BID  . sodium chloride  3 mL Intravenous Q12H  . vitamin C  1,000 mg Oral Daily   Infusions:  . lactated ringers     Assessment: 79 yo female s/p total elbow arthroplasty will be put on vancomycin x 48hrs for surgical prophylaxis. CrCl ~36.  Patient received vancomycin 1g iv at ~1300 today    Goal of Therapy:  Vancomycin trough level 15-20 mcg/ml  Plan:  1) Vancomycin 1g iv q24h x 48hrs, 1st dose at 1300 on 09/04/13   Ryu Cerreta, Tsz-Yin 09/03/2013,5:58 PM

## 2013-09-03 NOTE — Anesthesia Postprocedure Evaluation (Signed)
  Anesthesia Post-op Note  Patient: [name]  Procedure(s) Performed: Procedure(s): LEFT TOTAL ELBOW ARTHROPLASTY (Left)  Patient Location: PACU  Anesthesia Type:General and GA combined with regional for post-op pain  Level of Consciousness: sedated and patient cooperative  Airway and Oxygen Therapy: Patient Spontanous Breathing and Patient connected to nasal cannula oxygen  Post-op Pain: mild  Post-op Assessment: Post-op Vital signs reviewed, Patient's Cardiovascular Status Stable, Respiratory Function Stable, Patent Airway, No signs of Nausea or vomiting and Pain level controlled  Post-op Vital Signs: stable  Complications: No apparent anesthesia complications

## 2013-09-03 NOTE — Progress Notes (Signed)
Report called to Barbarann Ehlers, RN at [place].

## 2013-09-03 NOTE — Preoperative (Signed)
Beta Blockers   Reason not to administer Beta Blockers:Not Applicable 

## 2013-09-03 NOTE — Progress Notes (Signed)
Dr.Gramig at bedside to assess pt. Aware of finger tips on LUE being dusky and cool especially in ring finger. No new orders. Will cont to monitor.

## 2013-09-03 NOTE — Progress Notes (Addendum)
Give CRNA medications for block with CRNA at bedside. RN assisted MD with intrascalene block Timeout completed prior to starting block

## 2013-09-03 NOTE — Anesthesia Preprocedure Evaluation (Addendum)
Anesthesia Evaluation  Patient identified by MRN, date of birth, ID band Patient awake    Reviewed: Allergy & Precautions, H&P , NPO status   History of Anesthesia Complications (+) AWARENESS UNDER ANESTHESIA  Airway       Dental  (+) Edentulous Upper, Partial Lower and Dental Advisory Given   Pulmonary          Cardiovascular hypertension, Pt. on home beta blockers + Peripheral Vascular Disease Rhythm:Regular Rate:Normal     Neuro/Psych CVA, No Residual Symptoms    GI/Hepatic   Endo/Other    Renal/GU      Musculoskeletal   Abdominal   Peds  Hematology   Anesthesia Other Findings   Reproductive/Obstetrics                          Anesthesia Physical Anesthesia Plan  ASA: III  Anesthesia Plan: General   Post-op Pain Management:    Induction: Intravenous  Airway Management Planned: Oral ETT  Additional Equipment:   Intra-op Plan:   Post-operative Plan: Extubation in OR  Informed Consent: I have reviewed the patients History and Physical, chart, labs and discussed the procedure including the risks, benefits and alternatives for the proposed anesthesia with the patient or authorized representative who has indicated his/her understanding and acceptance.     Plan Discussed with: CRNA, Anesthesiologist and Surgeon  Anesthesia Plan Comments:         Anesthesia Quick Evaluation

## 2013-09-03 NOTE — Transfer of Care (Signed)
Immediate Anesthesia Transfer of Care Note  Patient: [name]  Procedure(s) Performed: Procedure(s): LEFT TOTAL ELBOW ARTHROPLASTY (Left)  Patient Location: PACU  Anesthesia Type:General and Regional  Level of Consciousness: patient cooperative and responds to stimulation  Airway & Oxygen Therapy: Patient Spontanous Breathing and Patient connected to nasal cannula oxygen  Post-op Assessment: Report given to PACU RN and Post -op Vital signs reviewed and stable  Post vital signs: Reviewed and stable  Complications: No apparent anesthesia complications

## 2013-09-04 ENCOUNTER — Inpatient Hospital Stay (HOSPITAL_COMMUNITY): Payer: Medicare Other

## 2013-09-04 DIAGNOSIS — I10 Essential (primary) hypertension: Secondary | ICD-10-CM

## 2013-09-04 DIAGNOSIS — S42409A Unspecified fracture of lower end of unspecified humerus, initial encounter for closed fracture: Secondary | ICD-10-CM

## 2013-09-04 DIAGNOSIS — I739 Peripheral vascular disease, unspecified: Secondary | ICD-10-CM | POA: Diagnosis not present

## 2013-09-04 DIAGNOSIS — R509 Fever, unspecified: Secondary | ICD-10-CM | POA: Diagnosis not present

## 2013-09-04 DIAGNOSIS — E785 Hyperlipidemia, unspecified: Secondary | ICD-10-CM | POA: Diagnosis not present

## 2013-09-04 LAB — CBC WITH DIFFERENTIAL/PLATELET
Eosinophils Absolute: 0.1 10*3/uL (ref 0.0–0.7)
Eosinophils Relative: 1 % (ref 0–5)
Lymphocytes Relative: 13 % (ref 12–46)
Lymphs Abs: 1.3 10*3/uL (ref 0.7–4.0)
MCH: 32.8 pg (ref 26.0–34.0)
Neutro Abs: 7.1 10*3/uL (ref 1.7–7.7)
Neutrophils Relative %: 71 % (ref 43–77)
Platelets: 152 10*3/uL (ref 150–400)
RBC: 2.59 MIL/uL — ABNORMAL LOW (ref 3.87–5.11)
RDW: 13.4 % (ref 11.5–15.5)
WBC: 10.1 10*3/uL (ref 4.0–10.5)

## 2013-09-04 LAB — URINALYSIS, ROUTINE W REFLEX MICROSCOPIC
Glucose, UA: NEGATIVE mg/dL
Hgb urine dipstick: NEGATIVE
Nitrite: NEGATIVE
Specific Gravity, Urine: 1.014 (ref 1.005–1.030)
Urobilinogen, UA: 0.2 mg/dL (ref 0.0–1.0)
pH: 5 (ref 5.0–8.0)

## 2013-09-04 LAB — BASIC METABOLIC PANEL
CO2: 20 mEq/L (ref 19–32)
Calcium: 7.8 mg/dL — ABNORMAL LOW (ref 8.4–10.5)
GFR calc non Af Amer: 53 mL/min — ABNORMAL LOW (ref >90)
Potassium: 3.2 mEq/L — ABNORMAL LOW (ref 3.5–5.1)
Sodium: 131 mEq/L — ABNORMAL LOW (ref 135–145)

## 2013-09-04 LAB — URINE MICROSCOPIC-ADD ON

## 2013-09-04 MED ORDER — KCL IN DEXTROSE-NACL 20-5-0.45 MEQ/L-%-% IV SOLN
INTRAVENOUS | Status: DC
  Administered 2013-09-04: 1000 mL via INTRAVENOUS
  Administered 2013-09-05: 13:00:00 via INTRAVENOUS
  Filled 2013-09-04 (×9): qty 1000

## 2013-09-04 MED ORDER — CLOPIDOGREL BISULFATE 75 MG PO TABS
75.0000 mg | ORAL_TABLET | Freq: Every day | ORAL | Status: DC
  Administered 2013-09-04 – 2013-09-08 (×5): 75 mg via ORAL
  Filled 2013-09-04 (×6): qty 1

## 2013-09-04 MED ORDER — FUROSEMIDE 10 MG/ML IJ SOLN
20.0000 mg | Freq: Once | INTRAMUSCULAR | Status: AC
  Administered 2013-09-04: 20 mg via INTRAVENOUS
  Filled 2013-09-04: qty 2

## 2013-09-04 NOTE — Progress Notes (Signed)
Patient with B/P of 173/59, HR 95, prn iv hydralazine 10mg  given.  Temp was 100.4 at 8:40pm and increased to 102.8 at 11:55pm, patient also confused at this time but had been alert & oriented x 4 at beginning of 7p shift.  Daughter at bedside very concerned and has advised she has encouraged patient to use incentive spirometer a minimum of 5 times every hour since arriving back to [facility] early afternoon after surgery.  Tylenol po 650mg  given.  Paged MD on call Claiborne Billings) to advise of above, orders for temp workup placed.  MD Claiborne Billings also requested Orthopedics be paged to notify of temp of 102.8 and that no antibiotic change was ordered and if needed, Orthopedics should make those changes as the attending physicians.  Avel Peace PA on call for Orthopedics notified of above, no new orders received.   Currently awaiting PCXR to be done and lab to draw BC x 2.  Urine for UA/C&S sent to lab.  Will continue to monitor closely.  Dorothyann Peng RN

## 2013-09-04 NOTE — Progress Notes (Signed)
   Subjective: 1 Day Post-Op Procedure(s) (LRB): LEFT TOTAL ELBOW ARTHROPLASTY (Left) Patient reports pain as moderate.   Patient seen in rounds with Dr. Lequita Halt. Patient is having pain in the left arm as well as left hip. She had some difficulty last night following total elbow yesterday. She had some issues with a fever as well as confusion. Doing better now.  Plan is to go Home after hospital stay. Possible SNF placement but the family would like to go home if possible. Family wants to see how she progresses with therapy before making a definite decision.   Objective: Vital signs in last 24 hours: Temp:  [97.4 F (36.3 C)-102.8 F (39.3 C)] 98.2 F (36.8 C) (10/19 0451) Pulse Rate:  [73-100] 90 (10/19 0924) Resp:  [16-29] 18 (10/19 0924) BP: (119-182)/(43-96) 119/47 mmHg (10/19 0924) SpO2:  [91 %-99 %] 94 % (10/19 0924)  Intake/Output from previous day:  Intake/Output Summary (Last 24 hours) at 09/04/13 1012 Last data filed at 09/04/13 0452  Gross per 24 hour  Intake   1500 ml  Output   1420 ml  Net     80 ml     Labs:  Recent Labs  09/01/13 1600 09/02/13 0415 09/04/13 0340  HGB 12.4 10.9* 8.5*    Recent Labs  09/02/13 0415 09/04/13 0340  WBC 9.0 10.1  RBC 3.50* 2.59*  HCT 32.0* 24.0*  PLT 197 152    Recent Labs  09/02/13 0415 09/04/13 0340  NA 136 131*  K 4.0 3.2*  CL 103 100  CO2 23 20  BUN 16 9  CREATININE 1.05 0.97  GLUCOSE 131* 125*  CALCIUM 8.5 7.8*    Recent Labs  09/01/13 1600 09/02/13 0415  INR 0.91 1.01    EXAM General - Patient is Alert and Oriented Extremity - Neurologically intact Sensation intact distally Dorsiflexion/Plantar flexion intact Dressing/Incision - clean, dry, hemovac in place from left elbow Motor Function - intact, moving foot, toes, and fingers well on exam.   Past Medical History  Diagnosis Date  . Hypertension   . Stroke     History of TIA  . History of angina   . Peripheral vascular disease      Assessment/Plan: 1 Day Post-Op Procedure(s) (LRB): LEFT TOTAL ELBOW ARTHROPLASTY (Left) Active Problems:   Peripheral vascular disease, unspecified   HTN (hypertension)   HLD (hyperlipidemia)  Estimated body mass index is 25.24 kg/(m^2) as calculated from the following:   Height as of this encounter: 5\' 2"  (1.575 m).   Weight as of this encounter: 62.6 kg (138 lb 0.1 oz). Advance diet  DVT Prophylaxis - resume Plavix NWB left LE, left UE  Will continue to follow for pelvic fracture.   [name] [name] 09/04/2013, 10:12 AM

## 2013-09-04 NOTE — Progress Notes (Signed)
Subjective: 1 Day Post-Op Procedure(s) (LRB): LEFT TOTAL ELBOW ARTHROPLASTY (Left) Patient reports pain as mild.  She is alert. She has a Foley in place which will keep the night of course for comfort. She is on vancomycin. I discussed her care with 2 of her nursing staff present and the family. I performed a full examination at 830 p.m. tonight Objective: Vital signs in last 24 hours: Temp:  [98.2 F (36.8 C)-102.8 F (39.3 C)] 101.7 F (38.7 C) (10/19 1910) Pulse Rate:  [83-95] 83 (10/19 1443) Resp:  [16-18] 18 (10/19 1443) BP: (114-173)/(43-59) 114/47 mmHg (10/19 1443) SpO2:  [94 %-99 %] 96 % (10/19 1443)  Intake/Output from previous day: 10/18 0701 - 10/19 0700 In: 1500 [I.V.:1500] Out: 1420 [Urine:1370; Drains:50] Intake/Output this shift:     Recent Labs  09/02/13 0415 09/04/13 0340  HGB 10.9* 8.5*    Recent Labs  09/02/13 0415 09/04/13 0340  WBC 9.0 10.1  RBC 3.50* 2.59*  HCT 32.0* 24.0*  PLT 197 152    Recent Labs  09/02/13 0415 09/04/13 0340  NA 136 131*  K 4.0 3.2*  CL 103 100  CO2 23 20  BUN 16 9  CREATININE 1.05 0.97  GLUCOSE 131* 125*  CALCIUM 8.5 7.8*    Recent Labs  09/02/13 0415  INR 1.01   Physical exam: Patient is alert. She knows my name. She knows her location. She has sensation to the hand/fingers. She is up to move the fingers. I have removed her drain at bedside. There is no active discharge from her wound via the drain.  I have noted that she is moving her ankles and legs well she can perform a straight leg raise with her hip supported on the left and right sides The patient has bruising to the right arm but no focal fracture. She does not have any chest pain. She has equal chest expansion. Neurologically intact ABD soft Sensation intact distally Compartment soft  Assessment/Plan: 1 Day Post-Op Procedure(s) (LRB): LEFT TOTAL ELBOW ARTHROPLASTY (Left) I discussed all issues with she and her family at bedside. We'll continue  postop observation and therapy. We will continue range of motion to the extremities within the confines of her limitations. We'll continue therapeutic endeavors as she tolerates.  I discussed with the family that it will be very important to mobilize her to prevent morbidity after this injury process.  That well versed with the importance of proper recovery. We're going to change her IV fluids to D5 half-normal saline with 20 mEq of potassium chloride. We're going to DC her dye log did and oxycodone do to mild confusion earlier tonight which is now resolved.  She has had a temperature spike. I discussed with the family that it is much too early to see infection after one of these operations to be the culprit of a temperature spike. Will monitor her fever curve closely.  It was a pleasure to see them today. I feel the family is very engaged and understands the challenges ahead.   Karen Chafe 09/04/2013, 8:39 PM

## 2013-09-04 NOTE — Evaluation (Signed)
Physical Therapy Evaluation Patient Details Name: [name] MRN: [id number] DOB: 1928-03-30 Today's Date: 09/04/2013 Time: 4098-1191 PT Time Calculation (min): 28 min  PT Assessment / Plan / Recommendation History of Present Illness  Fall resulting in L elbow fx, now s/p reconstruction, and L acetabular protrusio fx, now NWB  Clinical Impression  Patient is s/p above injuries and  surgery resulting in functional limitations due to the deficits listed below (see PT Problem List).  Patient will benefit from skilled PT to increase their independence and safety with mobility to allow discharge to the venue listed below.   Pt would very much like to dc to her home and her daughter will be ablet o care for her; I recommend postacute rehab to maximize independence and safety prior to dc home      PT Assessment  Patient needs continued PT services    Follow Up Recommendations  CIR;Supervision/Assistance - 24 hour    Does the patient have the potential to tolerate intense rehabilitation      Barriers to Discharge        Equipment Recommendations  Hospital bed;Wheelchair cushion (measurements PT);Wheelchair (measurements PT);Other (comment) (shower wheelchair)    Recommendations for Other Services Rehab consult   Frequency Min 4X/week    Precautions / Restrictions Precautions Precautions: Fall Restrictions LUE Weight Bearing: Non weight bearing LLE Weight Bearing: Non weight bearing   Pertinent Vitals/Pain Reported dizziness with sitting up; ?orthostatic hypotension, but unable to obtain BP while pt was sitting Considerable pain L elbow and hip; patient repositioned for comfort       Mobility  Bed Mobility Bed Mobility: Supine to Sit;Sitting - Scoot to Edge of Bed;Sit to Supine Supine to Sit: 1: +2 Total assist;HOB elevated;With rails (Right rail) Supine to Sit: Patient Percentage: 20% Sitting - Scoot to Edge of Bed: 1: +1 Total assist (with bed pad) Sit to Supine:  1: +2 Total assist (third person was helpful) Sit to Supine: Patient Percentage: 20% Details for Bed Mobility Assistance: Very painful with motion; used bed pad to pre-position for sitting up, then pt required +2 physical assist to elevate trunk from bed; very antalgic lean to Right in sitting; Reported dizziness, so assisted pt back supine with +3 assist for support of LUE an dassistance of LEs back to bed Transfers Transfers: Not assessed    Exercises     PT Diagnosis: Acute pain;Generalized weakness  PT Problem List: Decreased strength;Decreased range of motion;Decreased activity tolerance;Decreased balance;Decreased mobility;Decreased coordination;Decreased knowledge of use of DME;Pain PT Treatment Interventions: DME instruction;Functional mobility training;Therapeutic activities;Therapeutic exercise;Patient/family education;Wheelchair mobility training     PT Goals(Current goals can be found in the care plan section) Acute Rehab PT Goals Patient Stated Goal: Very much wants to go home PT Goal Formulation: With patient/family Time For Goal Achievement: 09/18/13 Potential to Achieve Goals: Good Additional Goals Additional Goal #1: Pt will be able to manage wheelchair parts and propulsion with min assist  Visit Information  Last PT Received On: 09/04/13 Assistance Needed: +2 History of Present Illness: Fall resulting in L elbow fx, now s/p reconstruction, and L acetabular protrusio fx, now NWB       Prior Functioning  Home Living Family/patient expects to be discharged to:: Private residence Living Arrangements: Spouse/significant other;Children Available Help at Discharge: Family;Friend(s);Home health;Available 24 hours/day (Will arrange for shifts) Type of Home: House Home Access: Stairs to enter [employer] of Steps: 2 Entrance Stairs-Rails: Right;Left Home Layout: One level Home Equipment: Shower seat - built in;Bedside commode;Walker -  2 wheels;Cane - single  point Prior Function Level of Independence: Independent with assistive device(s) Communication Communication: No difficulties    Cognition  Cognition Arousal/Alertness: Awake/alert Behavior During Therapy: WFL for tasks assessed/performed Overall Cognitive Status: Within Functional Limits for tasks assessed    Extremity/Trunk Assessment Upper Extremity Assessment Upper Extremity Assessment: Defer to OT evaluation (LUE in bulky dressing; able to move fingers) Lower Extremity Assessment Lower Extremity Assessment: Generalized weakness;LLE deficits/detail LLE Deficits / Details: Decr AROM and strength, limited by pain postop   Balance Balance Balance Assessed: Yes Static Sitting Balance Static Sitting - Balance Support: Right upper extremity supported Static Sitting - Level of Assistance: 3: Mod assist Static Sitting - Comment/# of Minutes: EOB approx 5 minutes with mod assist to help maintain upright sitting; Pt with very antalgic R lean; reported dizziness, so assisted pt back to supine  End of Session PT - End of Session Equipment Utilized During Treatment: Other (comment) (bed pad) Activity Tolerance: Patient limited by pain;Patient limited by fatigue Patient left: in bed;with call bell/phone within reach;with family/visitor present (bed in semi-chair position) Nurse Communication: Mobility status  GP     Van Clines [facility] [place], [place] [phone]  09/04/2013, 4:27 PM

## 2013-09-04 NOTE — Progress Notes (Signed)
Evening call: report of patient with mild dyspnea - started on O2 @ 1 liter. CXR two view: FINDINGS:  Mild cardiomegaly. Bibasilar airspace opacities are again noted,  similar to prior study. Suspect small effusions. Mild vascular  congestion and peribronchial thickening.  IMPRESSION:  Findings similar prior study with bibasilar opacities. Suspect small  effusions.  Plan: for mild vascular congestion will give single dose Iv Lasix.

## 2013-09-04 NOTE — Op Note (Signed)
[name], [name]              ACCOUNT NO.:  [id number]  MEDICAL RECORD NO.:  [id number]  LOCATION:  [id number]                        FACILITY:  [facility]  PHYSICIAN:  Dionne Ano. Scherrie Seneca, M.D.DATE OF BIRTH:  [birth year]  DATE OF PROCEDURE: DATE OF DISCHARGE:                              OPERATIVE REPORT   PREOPERATIVE DIAGNOSIS: 1. Left elbow comminuted complex intra-articular fracture about the     distal humerus. 2. Coronoid fracture, left ulna. 3. Ulnar nerve contusion.  POSTOPERATIVE DIAGNOSIS: 1. Left elbow comminuted complex intra-articular fracture about the     distal humerus. 2. Coronoid fracture, left ulna. 3. Ulnar nerve contusion.  PROCEDURE: 1. Excision of supracondylar humerus fracture fragments, left distal     humerus. 2. Ulnar nerve decompression and anterior transposition. 3. Radial head resection. 4. Total elbow arthroplasty for treatment of the supracondylar humerus     fracture. 5. Stress radiography.  SURGEON:  Dionne Ano. Amanda Pea, M.D.  ASSISTANT:  Madelynn Done, MD.  TOURNIQUET TIME:  Less than 2 hours.  DRAINS:  1.  INDICATIONS:  This patient is a pleasant female, who presents with the above-mentioned diagnosis.  She has very comminuted complex fracture. Unfortunately, this in a horrible amount of disarray.  It is very low and primary ORIF is not going to be an option for her.  I discussed with her that given her age and activity level and other issues surrounding her care, I felt that a total elbow arthroplasty would likely be her best avenue of care.  She understands risks and benefits and desires to proceed.  All questions have been encouraged and answered preoperatively.  She does have a left acetabular fracture as well as pre-existing right shoulder problems including arthritis and loss of motion.  The patient also has significant arthritis in her hands.  OPERATIVE PROCEDURE:  The patient was seen by myself and Anesthesia, taken to the  operative suite, and underwent a smooth induction of general anesthesia, laid supine, and then placed in a modified sloppy lateral position, well padded by myself.  I then personally performed a 10 minutes surgical Betadine scrub, followed by paint, followed by sterile drape application.  Time-out was called.  Sterile tourniquet was applied.  Following this, sterile tourniquet was insufflated, outline marks were made and under [place] over the top of the skin I made an incision.  Dissection was carried down to the fascia.  The interval between the fashion and subcu was created.  Following this, ulnar nerve was decompressed about the arcade of Struthers, medial intermuscular septum, 2 heads of the FCU, Osborne ligament, and the cubital tunnel. Nerve root was gently mobilized and moved forward.  There was a large amount of disarray of the soft tissues and fracture fragments.  These were carefully worked around.  I placed a vessel loop around the nerve and tucked it anteriorly.  Following this, I then very carefully lifted a tongue-of-triceps fascia of the ulna.  Once this was done, I then peeled it all way back to expose the comminuted fracture fragments. Once this done, I then excised the tip of the olecranon with oscillating saw.  Once this was done, it was very apparent that the patient  had coronoid fracture and this was debrided appropriately and treated in an open fashion.  Following this, we then performed a radial head resection with oscillating saw.  This was done without difficulty.  Bone wax was placed against the radial head.  Following this, we then removed the very comminuted fracture fragments about the capitulum trochlea and very distal end of the humerus.  These were removed without difficulty.  Fortunately, there were some of the medial and lateral columns which were still present for extra support for the total elbow arthroplasty, which was planned.  At this time, a  Biomet discovery elbow arthroplasty procedure then ensued.  I performed creation of a fossa to my satisfaction with combination of rongeur, oscillating saw, and fossa reamer.  Following this, we used the barrel reamer to smooth the area followed by the guide and broach to enter the canal.  The canal was then broached up to a size 4 without difficulty.  I was very pleased with the broaching.  Following this, the barrel reamer was then adjusted about the spool/olecranon fossa region so as to accept the humeral component without difficulty. This was done without difficulty.  I took a great deal of time to make sure we had good columns to support the arthroplasty.  I was pleased with this findings.  Following this, I then very carefully and cautiously turned attention towards the ulna where we opened the ulnar canal and then with combination of a fossa reamer as well as bur rongeur and associated orthopedic instruments, enlarged this area to accept the ulnar component.  As mentioned, the humeral component was a size 4.  The ulnar component was broached accordingly to a size 3, which fit the best in my opinion.  With this noted, we then very carefully and cautiously performed combination of fossa reaming and broaching so as to accept the size 3 Biomet humeral implant.  Once this was done, I then once again irrigated the area followed by placement of trial components and trialed the entire apparatus, which looked quite well.  I was pleased with the trial components and the trial apparatus.  Following this, we then very carefully and cautiously irrigated followed by obtaining the final implants.  The final implants were obtained.  The patient had canal/cement restrictors placed in the ulna and humerus at appropriate lengths.  Following this, we then mixed this cement with gentamicin impregnated cement followed by placement of the cement.  The humeral component was placed first and impacted  nicely.  Following this, the ulnar component was impacted.  I then put the trial Discovery slots on the components to coapt and allow the cement cure.  The cement cured without difficulty and there were no complicating features.  Following allowing the cement to cure, we then of course removed any excessive cement and made sure the soft tissues looked excellent.  They did and we were pleased with this.  At all times, we protected the ulnar nerve.  Following this, I then put the final slots about the elbow and engaged the ulnar and humeral components.  The patient tolerated this well. There were no complicating features.  I then put the 2 set screws in the Discovery elbow according to protocol.  The patient had full smooth arc of motion with flexion, extension, pronation, and supination.  I took x- rays.  A stress radiography was performed.  There were no cracks in the bone, all looked well.  Cement mantles were excellent and I was pleased  with this.  Following this, I then performed the triceps reattachment through bony drill holes which were previously placed prior to cement placement. These drill holes were placed in the ulna in a crisscross fashion, and __________ sutures had previously been placed.  Following a modified Krackow stitch with 4 exiting FiberWire strands being placed through these drill holes, I then tied these down and the fascia was then repaired very nicely with a 2-0 FiberWire.  I was very pleased with the range of motion, the stability of the triceps reattachment and all aspects of the replacement.  We then irrigated copiously as we did during multiple points during the procedure and following this, I placed a Hemovac drain, followed by tacking down the ulnar nerve via tacking down the subcutaneous tissue in anterior transposed position.  The ulnar nerve sat nicely.  There were no complicating features and following this the subcu was closed with Vicryl and skin  edge with Prolene.  I placed Adaptic and Xeroform throughout the entire arm, she has a very thin and fragile skin, but there were noted skin tears, rips, or other problems.  She was placed in a long-arm splint.  She tolerated the procedure well.  Drain was hooked up to suction.  She will be monitored in the recovery room.  We will plan for vancomycin postoperatively and I will ask her to notify me should any problems occur.  I will ask the hospitalist to engage her care given the hip fracture.  I did put some gentle traction on the left hip at the conclusion of the procedure as instructed by my partner, Dr. Darrelyn Hillock.  This was done only very gently. The patient tolerated the procedure well.  She had excellent pulse, soft compartments, and good refill.  We going to see her back in the office in two and half weeks.  We will remove her sutures.  I do not want her to come out of her dressing until that time.  I have discussed with the patient these issues, the do's and don'ts, etc., and all questions have been encouraged and answered.  She has a long road ahead of her. Unfortunately, she is going to be bed-to-chair as she cannot put any weight on the left lower extremity and she cannot put any weight on the left upper extremity.  Right upper extremity is somewhat limited due to her arthritis.  Nevertheless, we will try and have PT see her to bed-to- chair transfers.     Dionne Ano. Amanda Pea, M.D.     [facility]  D:  09/03/2013  T:  09/03/2013  Job:  [id number]

## 2013-09-04 NOTE — Progress Notes (Signed)
Subjective: Consult follow up on patient who sustained left acetabular fracture and crush fracture left elbow s/p elbow replacement. Medical problems of HTN, PVD, HLD.  [name] is awake and alert. Per her Daughter she was confused in the night. [name] does c/o pain in the arm  Objective: Lab:  Recent Labs  09/01/13 1600 09/02/13 0415 09/04/13 0340  WBC 8.6 9.0 10.1  NEUTROABS 5.1 6.4 7.1  HGB 12.4 10.9* 8.5*  HCT 35.9* 32.0* 24.0*  MCV 90.9 91.4 92.7  PLT 216 197 152    Recent Labs  09/01/13 1600 09/02/13 0415 09/04/13 0340  NA 135 136 131*  K 3.7 4.0 3.2*  CL 101 103 100  GLUCOSE 115* 131* 125*  BUN 19 16 9   CREATININE 1.14* 1.05 0.97  CALCIUM 9.0 8.5 7.8*    Imaging:  Scheduled Meds: . amitriptyline  25 mg Oral QHS  . Chlorhexidine Gluconate Cloth  6 each Topical Q0600  . docusate sodium  100 mg Oral BID  . famotidine  20 mg Oral Daily  . irbesartan  75 mg Oral Daily   And  . hydrochlorothiazide  6.25 mg Oral Daily  . metoprolol  50 mg Oral BID  . mupirocin ointment  1 application Nasal BID  . sodium chloride  3 mL Intravenous Q12H  . vancomycin  1,000 mg Intravenous Q24H  . vitamin C  1,000 mg Oral Daily   Continuous Infusions: . lactated ringers     PRN Meds:.acetaminophen, acetaminophen, bisacodyl, famotidine, hydrALAZINE, HYDROcodone-acetaminophen, HYDROmorphone (DILAUDID) injection, ondansetron, oxyCODONE, polyethylene glycol, sodium chloride   Physical Exam: Filed Vitals:   09/04/13 0451  BP: 131/51  Pulse: 90  Temp: 98.2 F (36.8 C)  Resp: 16    Intake/Output Summary (Last 24 hours) at 09/04/13 0716 Last data filed at 09/04/13 0452  Gross per 24 hour  Intake   1500 ml  Output   1420 ml  Net     80 ml   Gen'l - elderly woman with bulky dressing left arm. HEENT_ C&S clear Cor- RRR Pulm - normal respirations Neuro - awake and alert.      Assessment/Plan: 1. Ortho - POD #1 after elbow repair. She will not be able to bear  weight for 6-8 weeks 2/2 acetabular fracture. She has limited ROM right shoulder 2/2 prior injury.  Plan Per ortho  2. PVD - stable and doing well  Plan  Resume Plavix  3. HTN - good control at this time  4. HLD - will continue home medication  5. Anemia - post op drop to 8.5 g. Patient is asymptomatic - no c/p or SOB  Plan Monitor H/H, transfusio threshold of 7 g.  dispo - daughter wishes to take her home if possible. Raised the possibility of CIR if able to participate. Will wait to call for CIR consult until ortho feels this is reasonable.    Illene Regulus [facility] (o) [phone]; (c) [phone] Call-grp - Patsi Sears [facility]  Tele: [phone]  09/04/2013, 7:04 AM

## 2013-09-05 ENCOUNTER — Encounter (HOSPITAL_COMMUNITY): Payer: Self-pay | Admitting: Orthopedic Surgery

## 2013-09-05 DIAGNOSIS — D62 Acute posthemorrhagic anemia: Secondary | ICD-10-CM | POA: Diagnosis not present

## 2013-09-05 DIAGNOSIS — S329XXA Fracture of unspecified parts of lumbosacral spine and pelvis, initial encounter for closed fracture: Secondary | ICD-10-CM

## 2013-09-05 DIAGNOSIS — S52023A Displaced fracture of olecranon process without intraarticular extension of unspecified ulna, initial encounter for closed fracture: Secondary | ICD-10-CM

## 2013-09-05 DIAGNOSIS — S42402A Unspecified fracture of lower end of left humerus, initial encounter for closed fracture: Secondary | ICD-10-CM

## 2013-09-05 DIAGNOSIS — S32402A Unspecified fracture of left acetabulum, initial encounter for closed fracture: Secondary | ICD-10-CM

## 2013-09-05 DIAGNOSIS — E785 Hyperlipidemia, unspecified: Secondary | ICD-10-CM | POA: Diagnosis not present

## 2013-09-05 DIAGNOSIS — W19XXXA Unspecified fall, initial encounter: Secondary | ICD-10-CM

## 2013-09-05 DIAGNOSIS — I739 Peripheral vascular disease, unspecified: Secondary | ICD-10-CM | POA: Diagnosis not present

## 2013-09-05 DIAGNOSIS — S42409A Unspecified fracture of lower end of unspecified humerus, initial encounter for closed fracture: Secondary | ICD-10-CM | POA: Diagnosis not present

## 2013-09-05 DIAGNOSIS — S82892A Other fracture of left lower leg, initial encounter for closed fracture: Secondary | ICD-10-CM

## 2013-09-05 DIAGNOSIS — I10 Essential (primary) hypertension: Secondary | ICD-10-CM | POA: Diagnosis not present

## 2013-09-05 DIAGNOSIS — D649 Anemia, unspecified: Secondary | ICD-10-CM | POA: Diagnosis not present

## 2013-09-05 LAB — CBC WITH DIFFERENTIAL/PLATELET
Basophils Absolute: 0 10*3/uL (ref 0.0–0.1)
Basophils Relative: 0 % (ref 0–1)
Eosinophils Absolute: 0.3 10*3/uL (ref 0.0–0.7)
Eosinophils Relative: 4 % (ref 0–5)
Hemoglobin: 7.8 g/dL — ABNORMAL LOW (ref 12.0–15.0)
Lymphs Abs: 1.9 10*3/uL (ref 0.7–4.0)
MCH: 32.1 pg (ref 26.0–34.0)
MCHC: 35.3 g/dL (ref 30.0–36.0)
MCV: 90.9 fL (ref 78.0–100.0)
Monocytes Relative: 13 % — ABNORMAL HIGH (ref 3–12)
Neutro Abs: 5.8 10*3/uL (ref 1.7–7.7)
Neutrophils Relative %: 63 % (ref 43–77)
Platelets: 188 10*3/uL (ref 150–400)
RDW: 13.3 % (ref 11.5–15.5)

## 2013-09-05 LAB — URINE CULTURE: Colony Count: NO GROWTH

## 2013-09-05 LAB — HEMOGLOBIN AND HEMATOCRIT, BLOOD: Hemoglobin: 7.3 g/dL — ABNORMAL LOW (ref 12.0–15.0)

## 2013-09-05 LAB — BASIC METABOLIC PANEL
CO2: 25 mEq/L (ref 19–32)
GFR calc Af Amer: 56 mL/min — ABNORMAL LOW (ref >90)
GFR calc non Af Amer: 48 mL/min — ABNORMAL LOW (ref >90)
Glucose, Bld: 126 mg/dL — ABNORMAL HIGH (ref 70–99)
Potassium: 3 mEq/L — ABNORMAL LOW (ref 3.5–5.1)
Sodium: 133 mEq/L — ABNORMAL LOW (ref 135–145)

## 2013-09-05 MED ORDER — POTASSIUM CHLORIDE CRYS ER 20 MEQ PO TBCR
20.0000 meq | EXTENDED_RELEASE_TABLET | Freq: Two times a day (BID) | ORAL | Status: DC
  Administered 2013-09-05: 20 meq via ORAL
  Filled 2013-09-05: qty 1

## 2013-09-05 MED ORDER — POTASSIUM CHLORIDE CRYS ER 20 MEQ PO TBCR
20.0000 meq | EXTENDED_RELEASE_TABLET | ORAL | Status: AC
  Administered 2013-09-05: 20 meq via ORAL
  Filled 2013-09-05: qty 1

## 2013-09-05 NOTE — Evaluation (Signed)
Occupational Therapy Evaluation Patient Details Name: [name] MRN: [id number] DOB: Jan 15, 1928 Today's Date: 09/05/2013 Time: 6578-4696 OT Time Calculation (min): 27 min  OT Assessment / Plan / Recommendation History of present illness Fall resulting in L elbow fx, now s/p reconstruction, and L acetabular protrusion fx not affecting femoral head, now NWB both extremities.   Clinical Impression   This 79 yo female s/p above presents to acute OT with deficits below. Will benefit from acute OT with follow up at SNF due to NWBing status left side (UE and LE) and decreased tolerance for activity.    OT Assessment  Patient needs continued OT Services    Follow Up Recommendations  SNF       Equipment Recommendations   (TBD at next venue)       Frequency  Min 2X/week    Precautions / Restrictions Precautions Precautions: Fall Restrictions Weight Bearing Restrictions: Yes LUE Weight Bearing: Non weight bearing LLE Weight Bearing: Non weight bearing   Pertinent Vitals/Pain No c/o    ADL  Eating/Feeding: Minimal assistance Where Assessed - Eating/Feeding: Chair;Bed level Grooming: Moderate assistance Where Assessed - Grooming: Supported sitting Upper Body Bathing: Moderate assistance Where Assessed - Upper Body Bathing: Supported sitting Lower Body Bathing: +1 Total assistance Where Assessed - Lower Body Bathing: Supine, head of bed up;Supine, head of bed flat;Rolling right and/or left Upper Body Dressing: +1 Total assistance Where Assessed - Upper Body Dressing: Supported sitting Lower Body Dressing: +1 Total assistance Where Assessed - Lower Body Dressing: Supine, head of bed up;Supine, head of bed flat;Rolling right and/or left Toilet Transfer: +2 Total assistance Toilet Transfer: Patient Percentage: 10% Toilet Transfer Method: Squat pivot (going to her right) Acupuncturist:  (recliner>bed (next to each other)) Equipment Used: Gait belt (pad under  pt) Transfers/Ambulation Related to ADLs: total A +2 (pt=10%) squat pivot recliner to bed (going to pt's right)    OT Diagnosis: Generalized weakness;Acute pain  OT Problem List: Decreased strength;Impaired balance (sitting and/or standing);Decreased range of motion;Decreased activity tolerance;Pain;Decreased knowledge of use of DME or AE;Impaired UE functional use OT Treatment Interventions: Self-care/ADL training;Balance training;Patient/family education;DME and/or AE instruction   OT Goals(Current goals can be found in the care plan section) Acute Rehab OT Goals Patient Stated Goal: Very much wants to go home OT Goal Formulation: With patient Time For Goal Achievement: 09/12/13 Potential to Achieve Goals: Good  Visit Information  Last OT Received On: 09/05/13 Assistance Needed: +2 History of Present Illness: Fall resulting in L elbow fx, now s/p reconstruction, and L acetabular protrusio fx, now NWB       Prior Functioning     Home Living Family/patient expects to be discharged to:: Private residence Living Arrangements: Spouse/significant other;Children Available Help at Discharge: Family;Friend(s);Home health;Available 24 hours/day Type of Home: House Home Access: Stairs to enter [employer] of Steps: 2 Entrance Stairs-Rails: Right;Left Home Layout: One level Home Equipment: Shower seat - built in;Bedside commode;Walker - 2 wheels;Cane - single point Prior Function Level of Independence: Independent with assistive device(s) Communication Communication: No difficulties Dominant Hand: Right         Vision/Perception Vision - History Patient Visual Report: No change from baseline   Cognition  Cognition Arousal/Alertness: Awake/alert Behavior During Therapy: WFL for tasks assessed/performed Overall Cognitive Status: Within Functional Limits for tasks assessed    Extremity/Trunk Assessment Upper Extremity Assessment Upper Extremity Assessment: LUE  deficits/detail LUE Deficits / Details: AAROM of shoulder WNL (however pt weak); digits are really only bending at  MCPs; rest of arm constrained by casting LUE Coordination: decreased fine motor;decreased gross motor     Mobility Bed Mobility Bed Mobility: Sit to Supine Sit to Supine: 1: +2 Total assist;HOB flat Sit to Supine: Patient Percentage: 10% Details for Bed Mobility Assistance: A at legs and trunk     Exercise Other Exercises Other Exercises: Educated pt/daughter on shoulder flex/ext and abduction/adduction exercises as well as digit exercises (supporting MCPS while bending PIPS and DIPS.  (all exercises 3-5x/day 10 reps each)   Balance Balance Balance Assessed: Yes Static Sitting Balance Static Sitting - Balance Support: Right upper extremity supported;Feet supported Static Sitting - Level of Assistance: 2: Max assist Static Sitting - Comment/# of Minutes: for pt to stay forward in recliner while gait belt applied and for [place] to get into postion to A her from recliner to bed.   End of Session OT - End of Session Equipment Utilized During Treatment: Gait belt Activity Tolerance: Patient limited by fatigue Patient left: in bed;with call bell/phone within reach;with family/visitor present       Evette Georges [phone] 09/05/2013, 1:43 PM

## 2013-09-05 NOTE — Consult Note (Signed)
Physical Medicine and Rehabilitation Consult  Reason for Consult:  Left elbow fracture and left acetabular fracture. Referring Physician: Dr. Lerry Paterson   HPI: [name] is a 79 y.o. female with h/o PVD, HTN; who missed a step while walking due to sun in her eyes, fell and was unable to get up. No LOC but with complaints of left elbow and left hip pain. She was admitted on 09/01/13 and work up indicated a comminuted complex left elbow fracture dislocation as well as Comminuted Fracture of Left Acetabulum and Pubis. Left ankle swelling noted with questionable lucency distal fibula. She underwent left total elbow arthroplasty with ulnar nerve decompression by Dr. Amanda Pea on 09/03/13. Is NWB LUE/LLE. Post op with dizziness as well as mild confusion. Treated with a dose of lasix for SOB due to mild overload and question transfusion due to ABLA hgb-7.8. PT evaluation done and MD, PT recommending CIR.    Review of Systems  HENT: Negative for hearing loss.   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  Respiratory: Negative for cough and shortness of breath.   Cardiovascular: Negative for chest pain and palpitations.  Gastrointestinal: Positive for heartburn and diarrhea (intermittent due to IBS). Negative for nausea and vomiting.  Genitourinary: Negative for urgency and frequency.  Musculoskeletal: Positive for joint pain.       Chronic right shoulder injury with weakness.   Neurological: Positive for sensory change (hypersensitivity bilateral feet. ). Negative for headaches.   Past Medical History  Diagnosis Date  . Hypertension   . Stroke     History of TIA  . History of angina   . Peripheral vascular disease    Past Surgical History  Procedure Laterality Date  . Abdominal hysterectomy    . Pr vein bypass graft,aorto-fem-pop  09-13-09    Left Fem-pop  . Joint replacement      knee  . Femoral artery stent  12-11-10    Left SFA   History  reviewed. No pertinent family history.  Social History:  Married. Sedentary PTA. Per reports that she quit smoking about 66 years ago. Her smoking use included Cigarettes. She smoked 0.00 packs per day. She has never used smokeless tobacco. She reports that she does not drink alcohol or use illicit drugs.  Allergies  Allergen Reactions  . Promethazine Hcl     IV  Drug only, unknown   . Sulfa Antibiotics Other (See Comments)    Unknown     Medications Prior to Admission  Medication Sig Dispense Refill  . acetaminophen (TYLENOL) 500 MG tablet Take 1,000 mg by mouth every 6 (six) hours as needed for pain. Patient took this medication for her pain.      Marland Kitchen amitriptyline (ELAVIL) 25 MG tablet Take 25 mg by mouth at bedtime.       Marland Kitchen aspirin 81 MG tablet Take 81 mg by mouth daily.      . clopidogrel (PLAVIX) 75 MG tablet Take 75 mg by mouth daily.      Marland Kitchen HYDROCHLOROTHIAZIDE PO Take by mouth.      . metoprolol (LOPRESSOR) 50 MG tablet Take 50 mg by mouth 2 (two) times daily.       . metoprolol succinate (TOPROL-XL) 25 MG 24 hr tablet Take 50 mg by mouth every morning. Patient took this medication at 8:30 am this morning.      . Multiple Vitamin (MULTIVITAMIN) tablet Take 1 tablet by mouth daily.      Marland Kitchen  valsartan-hydrochlorothiazide (DIOVAN-HCT) 160-12.5 MG per tablet Take 0.5 tablets by mouth daily.        Home: Home Living Family/patient expects to be discharged to:: Private residence Living Arrangements: Spouse/significant other;Children Available Help at Discharge: Family;Friend(s);Home health;Available 24 hours/day Type of Home: House Home Access: Stairs to enter [employer] of Steps: 2 Entrance Stairs-Rails: Right;Left Home Layout: One level Home Equipment: Shower seat - built in;Bedside commode;Walker - 2 wheels;Cane - single point   Functional History:   Functional Status:  Mobility: Bed Mobility Bed Mobility: Sit to Supine Supine to Sit: 1: +2 Total assist;HOB  elevated (slightly) Supine to Sit: Patient Percentage: 30% Sitting - Scoot to Edge of Bed: 1: +1 Total assist (with the bed pad) Sit to Supine: 1: +2 Total assist;HOB flat Sit to Supine: Patient Percentage: 10% Transfers Transfers: Scientist, clinical (histocompatibility and immunogenetics) Transfers;Lateral/Scoot Wellsite geologist Transfers: 1: +2 Total assist;From elevated surface Squat Pivot Transfers: Patient Percentage: 10% Lateral/Scoot Transfers: With slide board (Unable to perform) Ambulation/Gait Ambulation/Gait Assistance: Not tested (comment) Wheelchair Mobility Wheelchair Mobility: No  ADL: ADL Eating/Feeding: Minimal assistance Where Assessed - Eating/Feeding: Chair;Bed level Grooming: Moderate assistance Where Assessed - Grooming: Supported sitting Upper Body Bathing: Moderate assistance Where Assessed - Upper Body Bathing: Supported sitting Lower Body Bathing: +1 Total assistance Where Assessed - Lower Body Bathing: Supine, head of bed up;Supine, head of bed flat;Rolling right and/or left Upper Body Dressing: +1 Total assistance Where Assessed - Upper Body Dressing: Supported sitting Lower Body Dressing: +1 Total assistance Where Assessed - Lower Body Dressing: Supine, head of bed up;Supine, head of bed flat;Rolling right and/or left Toilet Transfer: +2 Total assistance Toilet Transfer Method: Squat pivot (going to her right) Toilet Transfer Equipment:  (recliner>bed (next to each other)) Equipment Used: Gait belt (pad under pt) Transfers/Ambulation Related to ADLs: total A +2 (pt=10%) squat pivot recliner to bed (going to pt's right)  Cognition: Cognition Overall Cognitive Status: Within Functional Limits for tasks assessed Orientation Level: Oriented X4 Cognition Arousal/Alertness: Awake/alert Behavior During Therapy: WFL for tasks assessed/performed Overall Cognitive Status: Within Functional Limits for tasks assessed  Blood pressure 110/59, pulse 111, temperature 98 F (36.7 C), temperature  source Oral, resp. rate 18, height 5\' 2"  (1.575 m), weight 62.6 kg (138 lb 0.1 oz), SpO2 94.00%. Physical Exam  Nursing note and vitals reviewed. Constitutional: She appears well-developed and well-nourished. She is sleeping. She is easily aroused.  HENT:  Head: Normocephalic and atraumatic.  Eyes: Conjunctivae are normal. Pupils are equal, round, and reactive to light.  Neck: Normal range of motion. Neck supple.  Cardiovascular: Normal rate and regular rhythm.   Respiratory: Effort normal and breath sounds normal. No respiratory distress. She has no wheezes.  GI: Soft. Bowel sounds are normal. She exhibits no distension. There is no tenderness.  Musculoskeletal: She exhibits edema.  Pain with attempts at ROM left knee. Bilateral feet with dysesthesias. Long arm cast LUE.   Neurological: She is alert and easily aroused.  LUE limited by splint--has intact grasp and sensation in hand. LLE grossly 1 to 1+ prox to 3/5 distally due to pain/ortho issues. RUE limited with abduction due to prior shoulder injury. Has intact biceps, triceps, and hand. RLE limited by pain proximally---2- to 3+ distally. No sensory findings except for ?dysesthesias over the feet, claw deformities of toes.  Pt is flat, fatigued. Falls asleep easily today  Skin: Skin is warm and dry.    Results for orders placed during the hospital encounter of 09/01/13 (from the past 24 hour(s))  HEMOGLOBIN AND HEMATOCRIT, BLOOD     Status: Abnormal   Collection Time    09/05/13  5:47 AM      Result Value Range   Hemoglobin 7.3 (*) 12.0 - 15.0 g/dL   HCT 16.1 (*) 09.6 - 04.5 %  BASIC METABOLIC PANEL     Status: Abnormal   Collection Time    09/05/13  5:47 AM      Result Value Range   Sodium 133 (*) 135 - 145 mEq/L   Potassium 3.0 (*) 3.5 - 5.1 mEq/L   Chloride 98  96 - 112 mEq/L   CO2 25  19 - 32 mEq/L   Glucose, Bld 126 (*) 70 - 99 mg/dL   BUN 10  6 - 23 mg/dL   Creatinine, Ser 4.09  0.50 - 1.10 mg/dL   Calcium 8.0 (*) 8.4 -  10.5 mg/dL   GFR calc non Af Amer 48 (*) >90 mL/min   GFR calc Af Amer 56 (*) >90 mL/min  CBC WITH DIFFERENTIAL     Status: Abnormal   Collection Time    09/05/13  8:00 AM      Result Value Range   WBC 9.2  4.0 - 10.5 K/uL   RBC 2.43 (*) 3.87 - 5.11 MIL/uL   Hemoglobin 7.8 (*) 12.0 - 15.0 g/dL   HCT 81.1 (*) 91.4 - 78.2 %   MCV 90.9  78.0 - 100.0 fL   MCH 32.1  26.0 - 34.0 pg   MCHC 35.3  30.0 - 36.0 g/dL   RDW 95.6  21.3 - 08.6 %   Platelets 188  150 - 400 K/uL   Neutrophils Relative % 63  43 - 77 %   Neutro Abs 5.8  1.7 - 7.7 K/uL   Lymphocytes Relative 20  12 - 46 %   Lymphs Abs 1.9  0.7 - 4.0 K/uL   Monocytes Relative 13 (*) 3 - 12 %   Monocytes Absolute 1.2 (*) 0.1 - 1.0 K/uL   Eosinophils Relative 4  0 - 5 %   Eosinophils Absolute 0.3  0.0 - 0.7 K/uL   Basophils Relative 0  0 - 1 %   Basophils Absolute 0.0  0.0 - 0.1 K/uL   Dg Chest 2 View  09/04/2013   CLINICAL DATA:  Fever  EXAM: CHEST  2 VIEW  COMPARISON:  09/04/2013  FINDINGS: Mild cardiomegaly. Bibasilar airspace opacities are again noted, similar to prior study. Suspect small effusions. Mild vascular congestion and peribronchial thickening.  IMPRESSION: Findings similar prior study with bibasilar opacities. Suspect small effusions.   Electronically Signed   By: Charlett Nose M.D.   On: 09/04/2013 21:19   Dg Chest Port 1 View  09/04/2013   CLINICAL DATA:  Fever  EXAM: PORTABLE CHEST - 1 VIEW  COMPARISON:  09/02/2013  FINDINGS: Lower lung volumes with interstitial coarsening. There is new hazy density at the bases. No definite effusion. Borderline cardiomegaly. Right glenohumeral arthroplasty.  IMPRESSION: New bibasilar opacities. In the setting of fever, this finding could represent early infection or aspiration.   Electronically Signed   By: Tiburcio Pea M.D.   On: 09/04/2013 02:48    Assessment/Plan: Diagnosis: left elbow fx, left acetabular fx after fall, ? Distal left fibular fx  1. Does the need for close, 24  hr/day medical supervision in concert with the patient's rehab needs make it unreasonable for this patient to be served in a less intensive setting? Potentially 2. Co-Morbidities requiring supervision/potential complications: HTN,  abla 3. Due to bladder management, bowel management, safety, skin/wound care, disease management, medication administration, pain management and patient education, does the patient require 24 hr/day rehab nursing? Potentially 4. Does the patient require coordinated care of a physician, rehab nurse, PT (1-2 hrs/day, 5 days/week) and OT (1-2 hrs/day, 5 days/week) to address physical and functional deficits in the context of the above medical diagnosis(es)? Potentially Addressing deficits in the following areas: balance, endurance, locomotion, strength, transferring, bowel/bladder control, bathing, dressing, feeding, grooming, toileting and psychosocial support 5. Can the patient actively participate in an intensive therapy program of at least 3 hrs of therapy per day at least 5 days per week? Potentially 6. The potential for patient to make measurable gains while on inpatient rehab is good and fair 7. Anticipated functional outcomes upon discharge from inpatient rehab are mod assist with PT, mod assist with OT, n/a with SLP. 8. Estimated rehab length of stay to reach the above functional goals is: 2-3 weeks? 9. Does the patient have adequate social supports to accommodate these discharge functional goals? Potentially 10. Anticipated D/C setting: Home 11. Anticipated post D/C treatments: HH therapy 12. Overall Rehab/Functional Prognosis: good and fair  RECOMMENDATIONS: This patient's condition is appropriate for continued rehabilitative care in the following setting: SNF (CIR?) Patient has agreed to participate in recommended program. Potentially Note that insurance prior authorization may be required for reimbursement for recommended care.  Comment: Spoke with the family  who really wants CIR. Daughter tells me her mother is a Visual merchandiser" as well. At this point, I am having a hard time seeing how she would tolerate CIR, but we will follow along for activity tolerance and functional progress.   Ranelle Oyster, MD, [facility] Physical Medicine & Rehabilitation     09/05/2013

## 2013-09-05 NOTE — Progress Notes (Signed)
Rehab admissions - Evaluated for possible admission.  Noted PT recommending CIR and OT recommending SNF.  I will follow along for progress and activity tolerance.  Call me for questions.  #[phone]

## 2013-09-05 NOTE — Progress Notes (Signed)
Rehab Admissions Coordinator Note:  Patient was screened by Trish Mage for appropriateness for an Inpatient Acute Rehab Consult.  Noted PT recommending CIR.  At this time, we are recommending Inpatient Rehab consult.  Lelon Frohlich M 09/05/2013, 8:04 AM  I can be reached at [phone].

## 2013-09-05 NOTE — Progress Notes (Signed)
Subjective: 2 Days Post-Op Procedure(s) (LRB): LEFT TOTAL ELBOW ARTHROPLASTY (Left) Patient reports pain as mild.    Objective: Vital signs in last 24 hours: Temp:  [98 F (36.7 C)-101.7 F (38.7 C)] 98 F (36.7 C) (10/20 0716) Pulse Rate:  [83-111] 111 (10/20 0941) Resp:  [17-18] 18 (10/20 0716) BP: (110-134)/(46-59) 110/59 mmHg (10/20 0941) SpO2:  [92 %-96 %] 94 % (10/20 0716)  Intake/Output from previous day: 10/19 0701 - 10/20 0700 In: 2280 [P.O.:480; I.V.:1800] Out: 1750 [Urine:1750] Intake/Output this shift: Total I/O In: 120 [P.O.:120] Out: 350 [Urine:350]   Recent Labs  09/04/13 0340 09/05/13 0547 09/05/13 0800  HGB 8.5* 7.3* 7.8*    Recent Labs  09/04/13 0340 09/05/13 0547 09/05/13 0800  WBC 10.1  --  9.2  RBC 2.59*  --  2.43*  HCT 24.0* 21.2* 22.1*  PLT 152  --  188    Recent Labs  09/04/13 0340 09/05/13 0547  NA 131* 133*  K 3.2* 3.0*  CL 100 98  CO2 20 25  BUN 9 10  CREATININE 0.97 1.05  GLUCOSE 125* 126*  CALCIUM 7.8* 8.0*   No results found for this basename: LABPT, INR,  in the last 72 hours  Neurologically intact ABD soft Intact pulses distally No cellulitis present Compartment soft Assessment/Plan: 2 Days Post-Op Procedure(s) (LRB): LEFT TOTAL ELBOW ARTHROPLASTY (Left) Plan to continue close obs Discussed with family Up with therapy and less confused   [name],[name] 09/05/2013, 1:03 PM

## 2013-09-05 NOTE — Progress Notes (Signed)
Assessment/Plan: Principal Problem:   Elbow fracture, left - post repair/replacement. Due to various injuries, will leave Foley in place for today. Patient and dtr aware it needs to come out soon (and they want it out soon as well) but we will leave in for today Active Problems:   Left acetabular fracture - NWB status for now   Ankle fracture, left   Peripheral vascular disease, unspecified   HTN (hypertension)   HLD (hyperlipidemia)   Acute blood loss anemia - Hb down to 7.3. Check in AM. May need transfusion   Dypsnea - may have been due to meds. Observe for now   Delirium - clear this morning. Possibly related to pain meds. Meds altered.    Subjective: Feels okay. Had a lot of pain last night. Was confused, dtr thinks due to pain meds. She is more oriented this morning. Had transient dyspnea with CXR that was largely nonrevealing and received IV Lasix x1.   Objective:  Vital Signs: Filed Vitals:   09/04/13 1443 09/04/13 1910 09/04/13 2224 09/05/13 0716  BP: 114/47  134/46 114/56  Pulse: 83  96 108  Temp: 99.2 F (37.3 C) 101.7 F (38.7 C) 98.7 F (37.1 C) 98 F (36.7 C)  TempSrc: Oral Oral Oral Oral  Resp: 18  17 18   Height:      Weight:      SpO2: 96%  92% 94%     EXAM: Comfortable. No tachypnea.    Intake/Output Summary (Last 24 hours) at 09/05/13 0748 Last data filed at 09/05/13 0718  Gross per 24 hour  Intake   1680 ml  Output   2100 ml  Net   -420 ml    Lab Results:  Recent Labs  09/04/13 0340 09/05/13 0547  NA 131* 133*  K 3.2* 3.0*  CL 100 98  CO2 20 25  GLUCOSE 125* 126*  BUN 9 10  CREATININE 0.97 1.05  CALCIUM 7.8* 8.0*   No results found for this basename: AST, ALT, ALKPHOS, BILITOT, PROT, ALBUMIN,  in the last 72 hours No results found for this basename: LIPASE, AMYLASE,  in the last 72 hours  Recent Labs  09/04/13 0340 09/05/13 0547  WBC 10.1  --   NEUTROABS 7.1  --   HGB 8.5* 7.3*  HCT 24.0* 21.2*  MCV 92.7  --   PLT 152  --     No results found for this basename: CKTOTAL, CKMB, CKMBINDEX, TROPONINI,  in the last 72 hours No components found with this basename: POCBNP,  No results found for this basename: DDIMER,  in the last 72 hours No results found for this basename: HGBA1C,  in the last 72 hours No results found for this basename: CHOL, HDL, LDLCALC, TRIG, CHOLHDL, LDLDIRECT,  in the last 72 hours No results found for this basename: TSH, T4TOTAL, FREET3, T3FREE, THYROIDAB,  in the last 72 hours No results found for this basename: VITAMINB12, FOLATE, FERRITIN, TIBC, IRON, RETICCTPCT,  in the last 72 hours  Studies/Results: Dg Chest 2 View  09/04/2013   CLINICAL DATA:  Fever  EXAM: CHEST  2 VIEW  COMPARISON:  09/04/2013  FINDINGS: Mild cardiomegaly. Bibasilar airspace opacities are again noted, similar to prior study. Suspect small effusions. Mild vascular congestion and peribronchial thickening.  IMPRESSION: Findings similar prior study with bibasilar opacities. Suspect small effusions.   Electronically Signed   By: Charlett Nose M.D.   On: 09/04/2013 21:19   Dg Chest Port 1 View  09/04/2013  CLINICAL DATA:  Fever  EXAM: PORTABLE CHEST - 1 VIEW  COMPARISON:  09/02/2013  FINDINGS: Lower lung volumes with interstitial coarsening. There is new hazy density at the bases. No definite effusion. Borderline cardiomegaly. Right glenohumeral arthroplasty.  IMPRESSION: New bibasilar opacities. In the setting of fever, this finding could represent early infection or aspiration.   Electronically Signed   By: Tiburcio Pea M.D.   On: 09/04/2013 02:48   Medications: Medications administered in the last 24 hours reviewed.  Current Medication List reviewed.    LOS: 4 days   [facility] Internal Medicine @ [facility] [phone]) 09/05/2013, 7:48 AM

## 2013-09-05 NOTE — Progress Notes (Signed)
Subjective: 2 Days Post-Op Procedure(s) (LRB): LEFT TOTAL ELBOW ARTHROPLASTY (Left) Patient reports pain as 4 on 0-10 scale.  Very Sedated this morning but she had a recent pain meds. Her daughter said that she has been confused yesterday so her pain meds have been decreased. Hbg 7.3, will need to follow closely because of Plavix and her fractured pelvis.  Objective: Vital signs in last 24 hours: Temp:  [98.7 F (37.1 C)-101.7 F (38.7 C)] 98.7 F (37.1 C) (10/19 2224) Pulse Rate:  [83-96] 96 (10/19 2224) Resp:  [17-18] 17 (10/19 2224) BP: (114-134)/(46-47) 134/46 mmHg (10/19 2224) SpO2:  [92 %-96 %] 92 % (10/19 2224)  Intake/Output from previous day: 10/19 0701 - 10/20 0700 In: 1680 [P.O.:480; I.V.:1200] Out: 1750 [Urine:1750] Intake/Output this shift:     Recent Labs  09/04/13 0340 09/05/13 0547  HGB 8.5* 7.3*    Recent Labs  09/04/13 0340 09/05/13 0547  WBC 10.1  --   RBC 2.59*  --   HCT 24.0* 21.2*  PLT 152  --     Recent Labs  09/04/13 0340 09/05/13 0547  NA 131* 133*  K 3.2* 3.0*  CL 100 98  CO2 20 25  BUN 9 10  CREATININE 0.97 1.05  GLUCOSE 125* 126*  CALCIUM 7.8* 8.0*   No results found for this basename: LABPT, INR,  in the last 72 hours  Dorsiflexion/Plantar flexion intact  Assessment/Plan: 2 Days Post-Op Procedure(s) (LRB): LEFT TOTAL ELBOW ARTHROPLASTY (Left) Up with therapy. No weight on Left Lower extremity.Follow Hbg closely.  Shawnelle Spoerl A 09/05/2013, 7:17 AM

## 2013-09-05 NOTE — Progress Notes (Signed)
Physical Therapy Treatment Patient Details Name: [name] MRN: [id number] DOB: 12/16/1927 Today's Date: 09/05/2013 Time: 4098-1191 PT Time Calculation (min): 35 min  PT Assessment / Plan / Recommendation  History of Present Illness Fall resulting in L elbow fx, now s/p reconstruction, and L acetabular protrusio fx, now NWB   PT Comments   Pt progressing towards physical therapy goals. Pt and daughter report decreased pain during session compared to yesterday. She continued to report dizziness/nausea upon sitting EOB, with BP reading 110/59 at that time. Symptoms improved with time and cold water to drink. Had difficulty initiating scoot transfer to chair with slide board, as R UE weak and pt could not get leverage to unweight herself to scoot. Squat pivot transfer went relatively well with +2 assist, and pt was able to maintain NWB status on L side.  Follow Up Recommendations  CIR;Supervision/Assistance - 24 hour     Does the patient have the potential to tolerate intense rehabilitation     Barriers to Discharge        Equipment Recommendations  Hospital bed;Wheelchair cushion (measurements PT);Wheelchair (measurements PT);Other (comment) Forensic scientist wheelchair)    Recommendations for Other Services Rehab consult  Frequency Min 4X/week   Progress towards PT Goals Progress towards PT goals: Progressing toward goals  Plan Current plan remains appropriate    Precautions / Restrictions Precautions Precautions: Fall Restrictions Weight Bearing Restrictions: Yes LUE Weight Bearing: Non weight bearing LLE Weight Bearing: Non weight bearing   Pertinent Vitals/Pain Pt reported moderate pain in L upper and lower extremities. BP as noted above when sitting EOB.    Mobility  Bed Mobility Bed Mobility: Supine to Sit;Sitting - Scoot to Edge of Bed Supine to Sit: 1: +2 Total assist;HOB elevated (slightly) Supine to Sit: Patient Percentage: 30% Sitting - Scoot to Edge of Bed: 1: +1  Total assist (with the bed pad) Details for Bed Mobility Assistance: Assist required for LE movement towards EOB and for trunk support and elevation to get fully upright. Pt with difficulty sitting unsupported at first, possibly due to pain with leaning weight on her L side. Pt reports dizziness and nausea in sitting. BP was taken (110/59) and pt reports feeling better, wishing to continue with the session. Transfers Transfers: Systems analyst;Lateral/Scoot Wellsite geologist Transfers: 1: +2 Total assist;From elevated surface Squat Pivot Transfers: Patient Percentage: 10% Lateral/Scoot Transfers: With slide board (Unable to perform) Details for Transfer Assistance: Pt unable to initiate transfer from bed to chair with use of slideboard and physical assist from therapist and tech. Pt was able to maintain NWB status on L during squat pivot transfer, with cues to lean forward. Ambulation/Gait Ambulation/Gait Assistance: Not tested (comment) Wheelchair Mobility Wheelchair Mobility: No    Exercises     PT Diagnosis:    PT Problem List:   PT Treatment Interventions:     PT Goals (current goals can now be found in the care plan section) Acute Rehab PT Goals Patient Stated Goal: Very much wants to go home PT Goal Formulation: With patient/family Time For Goal Achievement: 09/18/13 Potential to Achieve Goals: Good  Visit Information  Last PT Received On: 09/05/13 Assistance Needed: +2 History of Present Illness: Fall resulting in L elbow fx, now s/p reconstruction, and L acetabular protrusio fx, now NWB    Subjective Data  Subjective: "My foot really hurts, I don't know how much I'll be able to do." Patient Stated Goal: Very much wants to go home   Cognition  Cognition Arousal/Alertness:  Lethargic Behavior During Therapy: WFL for tasks assessed/performed Overall Cognitive Status: Within Functional Limits for tasks assessed    Balance  Static Sitting Balance Static Sitting  - Balance Support: Right upper extremity supported Static Sitting - Level of Assistance: 3: Mod assist Static Sitting - Comment/# of Minutes: EOB ~8 minutes progressing with independence for trunk support. Cues for pt to lean left.  End of Session PT - End of Session Equipment Utilized During Treatment: Gait belt (Slide board) Activity Tolerance: Patient limited by pain;Patient limited by fatigue;Patient limited by lethargy Patient left: in chair;with call bell/phone within reach;with family/visitor present Nurse Communication: Mobility status   GP     Ruthann Cancer 09/05/2013, 11:02 AM  Ruthann Cancer, PT, DPT Acute Rehabilitation Services [phone]

## 2013-09-06 ENCOUNTER — Inpatient Hospital Stay (HOSPITAL_COMMUNITY): Payer: Medicare Other

## 2013-09-06 DIAGNOSIS — S42409A Unspecified fracture of lower end of unspecified humerus, initial encounter for closed fracture: Secondary | ICD-10-CM | POA: Diagnosis not present

## 2013-09-06 DIAGNOSIS — I739 Peripheral vascular disease, unspecified: Secondary | ICD-10-CM | POA: Diagnosis not present

## 2013-09-06 DIAGNOSIS — S32409A Unspecified fracture of unspecified acetabulum, initial encounter for closed fracture: Secondary | ICD-10-CM | POA: Diagnosis not present

## 2013-09-06 DIAGNOSIS — D649 Anemia, unspecified: Secondary | ICD-10-CM | POA: Diagnosis not present

## 2013-09-06 DIAGNOSIS — I10 Essential (primary) hypertension: Secondary | ICD-10-CM | POA: Diagnosis not present

## 2013-09-06 DIAGNOSIS — E785 Hyperlipidemia, unspecified: Secondary | ICD-10-CM | POA: Diagnosis not present

## 2013-09-06 LAB — CBC
HCT: 21.4 % — ABNORMAL LOW (ref 36.0–46.0)
Hemoglobin: 7.2 g/dL — ABNORMAL LOW (ref 12.0–15.0)
MCH: 31.3 pg (ref 26.0–34.0)
MCHC: 33.6 g/dL (ref 30.0–36.0)
MCV: 93 fL (ref 78.0–100.0)
Platelets: 230 10*3/uL (ref 150–400)
RBC: 2.3 MIL/uL — ABNORMAL LOW (ref 3.87–5.11)
RDW: 13.9 % (ref 11.5–15.5)
WBC: 8.6 10*3/uL (ref 4.0–10.5)

## 2013-09-06 LAB — BASIC METABOLIC PANEL
BUN: 14 mg/dL (ref 6–23)
CO2: 22 mEq/L (ref 19–32)
Calcium: 8.1 mg/dL — ABNORMAL LOW (ref 8.4–10.5)
Chloride: 102 mEq/L (ref 96–112)
Creatinine, Ser: 0.99 mg/dL (ref 0.50–1.10)
GFR calc Af Amer: 60 mL/min — ABNORMAL LOW (ref >90)
GFR calc non Af Amer: 52 mL/min — ABNORMAL LOW (ref >90)
Glucose, Bld: 122 mg/dL — ABNORMAL HIGH (ref 70–99)
Potassium: 4 mEq/L (ref 3.5–5.1)
Sodium: 134 mEq/L — ABNORMAL LOW (ref 135–145)

## 2013-09-06 NOTE — Progress Notes (Signed)
Subjective: 3 Days Post-Op Procedure(s) (LRB): LEFT TOTAL ELBOW ARTHROPLASTY (Left) Patient reports pain as 3 on 0-10 scale. Less hip pain with motion. Her ankle is also much better. Her Hbg is stable at 7.2. Circulation is intact in her left hand. Case discussed with her son and we agree that she needs to go to SNF.Xray of Left ordered. Still remains at Non-Wght. Bearing on left.   Objective: Vital signs in last 24 hours: Temp:  [98.3 F (36.8 C)-98.6 F (37 C)] 98.6 F (37 C) (10/21 0536) Pulse Rate:  [89-111] 97 (10/21 0536) Resp:  [16-18] 16 (10/21 0536) BP: (110-152)/(46-59) 152/59 mmHg (10/21 0536) SpO2:  [94 %-97 %] 94 % (10/21 0536)  Intake/Output from previous day: 10/20 0701 - 10/21 0700 In: 340 [P.O.:340] Out: 1850 [Urine:1850] Intake/Output this shift:     Recent Labs  09/04/13 0340 09/05/13 0547 09/05/13 0800 09/06/13 0418  HGB 8.5* 7.3* 7.8* 7.2*    Recent Labs  09/05/13 0800 09/06/13 0418  WBC 9.2 8.6  RBC 2.43* 2.30*  HCT 22.1* 21.4*  PLT 188 230    Recent Labs  09/05/13 0547 09/06/13 0418  NA 133* 134*  K 3.0* 4.0  CL 98 102  CO2 25 22  BUN 10 14  CREATININE 1.05 0.99  GLUCOSE 126* 122*  CALCIUM 8.0* 8.1*   No results found for this basename: LABPT, INR,  in the last 72 hours  Dorsiflexion/Plantar flexion intact  Assessment/Plan: 3 Days Post-Op Procedure(s) (LRB): LEFT TOTAL ELBOW ARTHROPLASTY (Left) Up with therapy.Awaiting SNF. Xray will be done.  Elidia Bonenfant A 09/06/2013, 7:21 AM

## 2013-09-06 NOTE — Progress Notes (Signed)
Patient has a bed at [facility]. Helmer Dull, MSW, LCSWA [phone] 

## 2013-09-06 NOTE — Clinical Social Work Placement (Signed)
Clinical Social Work Department  CLINICAL SOCIAL WORK PLACEMENT NOTE  Patient: [name] Number: [id number] Admit date: 09/01/13  Clinical Social Worker: Sabino Niemann LCSWA Date/time: 09/06/2013 11:30 AM  Clinical Social Work is seeking post-discharge placement for this patient at the following level of care: SKILLED NURSING (*CSW will update this form in Epic as items are completed)  09/06/2013 Patient/family provided with [facility] Department of Clinical Social Work's list of facilities offering this level of care within the geographic area requested by the patient (or if unable, by the patient's family).  09/06/2013 Patient/family informed of their freedom to choose among providers that offer the needed level of care, that participate in Medicare, Medicaid or managed care program needed by the patient, have an available bed and are willing to accept the patient.  09/06/2013 Patient/family informed of [facility]' ownership interest in [facility], as well as of the fact that they are under no obligation to receive care at this facility.  PASARR submitted to EDS on 09/06/2013  PASARR number received from EDS on 09/06/2013  FL2 transmitted to all facilities in geographic area requested by pt/family on  FL2 transmitted to all facilities within larger geographic area on  Patient informed that his/her managed care company has contracts with or will negotiate with certain facilities, including the following:  Patient/family informed of bed offers received: 09/06/2013  Patient chooses bed at [facility]  Physician recommends and patient chooses bed at  Patient to be transferred to on  Patient to be transferred to facility by  The following physician request were entered in Epic:  Additional Comments:

## 2013-09-06 NOTE — Progress Notes (Signed)
Assessment/Plan: Principal Problem:   Elbow fracture, left - I note CIR evaluation and need to observe for 1-2 days before final decision. I will order SW consult for SNF rehab in the meantime. If appropriate bed becomes available (family prefers [facility] and [facility]), then we will proceed with that.  Active Problems:   Left acetabular fracture   Ankle fracture, left   Peripheral vascular disease, unspecified   HTN (hypertension) - controlled   HLD (hyperlipidemia)   Acute blood loss anemia - Hb is stable.    Subjective: Feels okay "except when they are tugging on me." No more dyspnea. Off of O2.   Objective:  Vital Signs: Filed Vitals:   09/05/13 0941 09/05/13 1554 09/05/13 2057 09/06/13 0536  BP: 110/59 110/46 139/49 152/59  Pulse: 111 89 92 97  Temp:  98.3 F (36.8 C) 98.4 F (36.9 C) 98.6 F (37 C)  TempSrc:      Resp:  18 16 16   Height:      Weight:      SpO2:  97% 97% 94%     EXAM: Alert, conversant. Comfortable.    Intake/Output Summary (Last 24 hours) at 09/06/13 0755 Last data filed at 09/06/13 0659  Gross per 24 hour  Intake   1120 ml  Output   1500 ml  Net   -380 ml    Lab Results:  Recent Labs  09/05/13 0547 09/06/13 0418  NA 133* 134*  K 3.0* 4.0  CL 98 102  CO2 25 22  GLUCOSE 126* 122*  BUN 10 14  CREATININE 1.05 0.99  CALCIUM 8.0* 8.1*   No results found for this basename: AST, ALT, ALKPHOS, BILITOT, PROT, ALBUMIN,  in the last 72 hours No results found for this basename: LIPASE, AMYLASE,  in the last 72 hours  Recent Labs  09/04/13 0340  09/05/13 0800 09/06/13 0418  WBC 10.1  --  9.2 8.6  NEUTROABS 7.1  --  5.8  --   HGB 8.5*  < > 7.8* 7.2*  HCT 24.0*  < > 22.1* 21.4*  MCV 92.7  --  90.9 93.0  PLT 152  --  188 230  < > = values in this interval not displayed. No results found for this basename: CKTOTAL, CKMB, CKMBINDEX, TROPONINI,  in the last 72 hours No components found with this basename: POCBNP,  No results found for  this basename: DDIMER,  in the last 72 hours No results found for this basename: HGBA1C,  in the last 72 hours No results found for this basename: CHOL, HDL, LDLCALC, TRIG, CHOLHDL, LDLDIRECT,  in the last 72 hours No results found for this basename: TSH, T4TOTAL, FREET3, T3FREE, THYROIDAB,  in the last 72 hours No results found for this basename: VITAMINB12, FOLATE, FERRITIN, TIBC, IRON, RETICCTPCT,  in the last 72 hours  Studies/Results: Dg Chest 2 View  09/04/2013   CLINICAL DATA:  Fever  EXAM: CHEST  2 VIEW  COMPARISON:  09/04/2013  FINDINGS: Mild cardiomegaly. Bibasilar airspace opacities are again noted, similar to prior study. Suspect small effusions. Mild vascular congestion and peribronchial thickening.  IMPRESSION: Findings similar prior study with bibasilar opacities. Suspect small effusions.   Electronically Signed   By: Charlett Nose M.D.   On: 09/04/2013 21:19   Medications: Medications administered in the last 24 hours reviewed.  Current Medication List reviewed.    LOS: 5 days   [facility] @ [facility] [phone]) 09/06/2013, 7:55 AM

## 2013-09-06 NOTE — Progress Notes (Signed)
Subjective: 3 Days Post-Op Procedure(s) (LRB): LEFT TOTAL ELBOW ARTHROPLASTY (Left) Patient reports pain as mild She is alert and oriented Discussed events with patient and daughter   Objective: Vital signs in last 24 hours: Temp:  [98.5 F (36.9 C)-98.6 F (37 C)] 98.5 F (36.9 C) (10/21 2216) Pulse Rate:  [79-97] 79 (10/21 2216) Resp:  [16-76] 76 (10/21 2216) BP: (146-152)/(56-76) 148/56 mmHg (10/21 2216) SpO2:  [94 %-95 %] 95 % (10/21 2216)  Intake/Output from previous day: 10/20 0701 - 10/21 0700 In: 1240 [P.O.:340; I.V.:900] Out: 1850 [Urine:1850] Intake/Output this shift: Total I/O In: -  Out: 800 [Urine:800]   Recent Labs  09/04/13 0340 09/05/13 0547 09/05/13 0800 09/06/13 0418  HGB 8.5* 7.3* 7.8* 7.2*    Recent Labs  09/05/13 0800 09/06/13 0418  WBC 9.2 8.6  RBC 2.43* 2.30*  HCT 22.1* 21.4*  PLT 188 230    Recent Labs  09/05/13 0547 09/06/13 0418  NA 133* 134*  K 3.0* 4.0  CL 98 102  CO2 25 22  BUN 10 14  CREATININE 1.05 0.99  GLUCOSE 126* 122*  CALCIUM 8.0* 8.1*   No results found for this basename: LABPT, INR,  in the last 72 hours  LUE; stable -good sensation and movement BLE stable HEENT WNL Urine clear   Assessment/Plan: 3 Days Post-Op Procedure(s) (LRB): LEFT TOTAL ELBOW ARTHROPLASTY (Left) Continue post op care  D/w family at length  [facility],Francoise Chojnowski M 09/06/2013, 11:23 PM

## 2013-09-07 DIAGNOSIS — S42409A Unspecified fracture of lower end of unspecified humerus, initial encounter for closed fracture: Secondary | ICD-10-CM | POA: Diagnosis not present

## 2013-09-07 DIAGNOSIS — E785 Hyperlipidemia, unspecified: Secondary | ICD-10-CM | POA: Diagnosis not present

## 2013-09-07 DIAGNOSIS — I739 Peripheral vascular disease, unspecified: Secondary | ICD-10-CM | POA: Diagnosis not present

## 2013-09-07 DIAGNOSIS — I10 Essential (primary) hypertension: Secondary | ICD-10-CM | POA: Diagnosis not present

## 2013-09-07 DIAGNOSIS — D649 Anemia, unspecified: Secondary | ICD-10-CM | POA: Diagnosis not present

## 2013-09-07 LAB — BASIC METABOLIC PANEL
BUN: 16 mg/dL (ref 6–23)
CO2: 23 mEq/L (ref 19–32)
Calcium: 8.6 mg/dL (ref 8.4–10.5)
Chloride: 103 mEq/L (ref 96–112)
Creatinine, Ser: 0.95 mg/dL (ref 0.50–1.10)
GFR calc Af Amer: 63 mL/min — ABNORMAL LOW (ref >90)
GFR calc non Af Amer: 54 mL/min — ABNORMAL LOW (ref >90)
Glucose, Bld: 99 mg/dL (ref 70–99)
Potassium: 4 mEq/L (ref 3.5–5.1)
Sodium: 137 mEq/L (ref 135–145)

## 2013-09-07 LAB — CBC WITH DIFFERENTIAL/PLATELET
Basophils Absolute: 0 10*3/uL (ref 0.0–0.1)
Basophils Relative: 0 % (ref 0–1)
HCT: 22.1 % — ABNORMAL LOW (ref 36.0–46.0)
Lymphocytes Relative: 25 % (ref 12–46)
Lymphs Abs: 1.8 10*3/uL (ref 0.7–4.0)
MCHC: 33.5 g/dL (ref 30.0–36.0)
MCV: 94 fL (ref 78.0–100.0)
Monocytes Absolute: 0.9 10*3/uL (ref 0.1–1.0)
Neutro Abs: 4.1 10*3/uL (ref 1.7–7.7)
Platelets: 285 10*3/uL (ref 150–400)
RBC: 2.35 MIL/uL — ABNORMAL LOW (ref 3.87–5.11)
RDW: 14.1 % (ref 11.5–15.5)
WBC: 7.2 10*3/uL (ref 4.0–10.5)

## 2013-09-07 MED ORDER — BISACODYL 10 MG RE SUPP: 10.0000 mg | Freq: Every day | RECTAL | Status: DC | PRN

## 2013-09-07 MED ORDER — HYDROCODONE-ACETAMINOPHEN 5-325 MG PO TABS: 1.0000 | ORAL_TABLET | ORAL | Status: DC | PRN

## 2013-09-07 MED ORDER — POLYETHYLENE GLYCOL 3350 17 G PO PACK: 17.0000 g | PACK | Freq: Every day | ORAL | Status: DC | PRN

## 2013-09-07 NOTE — Progress Notes (Signed)
Progress reviewed; Agree with amendment to [employer] plan as outlined by Verdell Face, PTA; Thank you,  Van Clines, [place] [phone]

## 2013-09-07 NOTE — Progress Notes (Signed)
Assessment/Plan: Principal Problem:   Elbow fracture, left - Family thinks Dr. Amanda Pea wants her here a bit longer. They are getting mixed signals. I thought she was ready to go but PA told them this morning (and note is not yet on chart as she just left) that she needs to stay to observe Hb. I apologized for the confusion. She will obviously stay today. I hope the bed at [facility] available today.  Active Problems:   Left acetabular fracture   Ankle fracture, left   Peripheral vascular disease, unspecified   HTN (hypertension) - controlled   HLD (hyperlipidemia)   Acute blood loss anemia - her last three Hb have been 7.3, 7.8, 7.2. I doubt it is "dropping".    Subjective: Patient comfortable. Did not have any confusion last night.   Objective:  Vital Signs: Filed Vitals:   09/06/13 0536 09/06/13 1430 09/06/13 2216 09/07/13 0211  BP: 152/59 146/76 148/56 148/61  Pulse: 97 88 79 78  Temp: 98.6 F (37 C) 98.6 F (37 C) 98.5 F (36.9 C) 98.2 F (36.8 C)  TempSrc:   Oral Oral  Resp: 16 17 16 18   Height:      Weight:      SpO2: 94% 95% 95% 97%     EXAM: Alert, conversant. Joking with me and family!   Intake/Output Summary (Last 24 hours) at 09/07/13 0732 Last data filed at 09/07/13 4098  Gross per 24 hour  Intake    440 ml  Output   2350 ml  Net  -1910 ml    Lab Results:  Recent Labs  09/06/13 0418 09/07/13 0512  NA 134* 137  K 4.0 4.0  CL 102 103  CO2 22 23  GLUCOSE 122* 99  BUN 14 16  CREATININE 0.99 0.95  CALCIUM 8.1* 8.6   No results found for this basename: AST, ALT, ALKPHOS, BILITOT, PROT, ALBUMIN,  in the last 72 hours No results found for this basename: LIPASE, AMYLASE,  in the last 72 hours  Recent Labs  09/05/13 0800 09/06/13 0418 09/07/13 0512  WBC 9.2 8.6 7.2  NEUTROABS 5.8  --  4.1  HGB 7.8* 7.2* 7.4*  HCT 22.1* 21.4* 22.1*  MCV 90.9 93.0 94.0  PLT 188 230 285   No results found for this basename: CKTOTAL, CKMB, CKMBINDEX,  TROPONINI,  in the last 72 hours No components found with this basename: POCBNP,  No results found for this basename: DDIMER,  in the last 72 hours No results found for this basename: HGBA1C,  in the last 72 hours No results found for this basename: CHOL, HDL, LDLCALC, TRIG, CHOLHDL, LDLDIRECT,  in the last 72 hours No results found for this basename: TSH, T4TOTAL, FREET3, T3FREE, THYROIDAB,  in the last 72 hours No results found for this basename: VITAMINB12, FOLATE, FERRITIN, TIBC, IRON, RETICCTPCT,  in the last 72 hours  Studies/Results: Dg Hip Portable 1 View Left  09/06/2013   CLINICAL DATA:  History of acetabular fracture. History of left pelvic rami fractures.  EXAM: PORTABLE LEFT HIP - 1 VIEW  COMPARISON:  CT 09/06/2013.  FINDINGS: Previously CT examination has demonstrated fractures of the left acetabulum and supra-acetabular region as well as left pelvic rami fractures. These are less well demonstrated on the AP plane imaging. No significant change in position or alignment is evident.  IMPRESSION: Erlinda Hong the left acetabulum and supra-acetabular region as well as left pelvic rami fractures. No significant change in position and alignment as compared to  previous CT.   Electronically Signed   By: Onalee Hua  Call M.D.   On: 09/06/2013 08:39   Medications: Medications administered in the last 24 hours reviewed.  Current Medication List reviewed.    LOS: 6 days   [facility] @ [facility] [phone]) 09/07/2013, 7:32 AM

## 2013-09-07 NOTE — Progress Notes (Signed)
Rehab admissions - Noted patient has a bed at [facility].  Agree patient will need SNF.  Call me for questions.  #[phone]

## 2013-09-07 NOTE — Progress Notes (Addendum)
Physical Therapy Treatment Patient Details Name: [name] MRN: [id number] DOB: 02/27/1928 Today's Date: 09/07/2013 Time: 4696-2952 PT Time Calculation (min): 23 min  PT Assessment / Plan / Recommendation  History of Present Illness Fall resulting in L elbow fx, now s/p reconstruction, and L acetabular protrusio fx, now NWB   PT Comments   Pt cont's to have difficulty with mobility due to NWBing LUE & LLE, pain, & generalized weakness.  Requires significant +2 (A) for all mobility.   Per chart review, Inpt Rehab MD feels pt would best benefit from ST-SNF rather than CIR.  D/c plans updated.     Follow Up Recommendations  SNF     Does the patient have the potential to tolerate intense rehabilitation     Barriers to Discharge        Equipment Recommendations  Hospital bed;Wheelchair cushion (measurements PT);Wheelchair (measurements PT);Other (comment)    Recommendations for Other Services    Frequency Min 4X/week   Progress towards PT Goals Progress towards PT goals: Not progressing toward goals - comment  Plan Discharge plan needs to be updated    Precautions / Restrictions Precautions Precautions: Fall Restrictions LUE Weight Bearing: Non weight bearing LLE Weight Bearing: Non weight bearing   Pertinent Vitals/Pain C/o pain Lt side.  Did not rate but states "it's up there".  Premedicated.  Repositioned for comfort.      Mobility  Bed Mobility Bed Mobility: Supine to Sit;Sitting - Scoot to Edge of Bed Supine to Sit: 1: +2 Total assist;HOB elevated Supine to Sit: Patient Percentage: 20% Sitting - Scoot to Edge of Bed: 1: +1 Total assist Details for Bed Mobility Assistance: (A) for all components of transitional movements.   Transfers Transfers: Heritage manager Transfers: 1: +2 Total assist Squat Pivot Transfers: Patient Percentage: 10% Details for Transfer Assistance: pivoted to Rt side.  use of draw pad to pivot hips bed>recliner.    Ambulation/Gait Ambulation/Gait Assistance: Not tested (comment)      PT Goals (current goals can now be found in the care plan section) Acute Rehab PT Goals PT Goal Formulation: With patient/family Time For Goal Achievement: 09/18/13 Potential to Achieve Goals: Good  Visit Information  Last PT Received On: 09/07/13 Assistance Needed: +2 History of Present Illness: Fall resulting in L elbow fx, now s/p reconstruction, and L acetabular protrusio fx, now NWB    Subjective Data      Cognition  Cognition Arousal/Alertness: Awake/alert Behavior During Therapy: WFL for tasks assessed/performed Overall Cognitive Status: Within Functional Limits for tasks assessed    Balance     End of Session PT - End of Session Equipment Utilized During Treatment: Gait belt Activity Tolerance: Patient limited by pain;Patient limited by fatigue Patient left: in chair;with call bell/phone within reach;with family/visitor present Nurse Communication: Mobility status   GP     Lara Mulch 09/07/2013, 12:56 PM  Verdell Face, PTA [phone] 09/07/2013

## 2013-09-07 NOTE — Progress Notes (Signed)
Subjective: 4 Days Post-Op Procedure(s) (LRB): LEFT TOTAL ELBOW ARTHROPLASTY (Left) Patient reports pain as controlled to the upper extremity, left pelvis/hip is tender. She denies n/v/f/c/sob/cp. Sitting up in chair, family room. Tentative D/C for [facility] tomorrow.    Objective: Vital signs in last 24 hours: Temp:  [98 F (36.7 C)-99.9 F (37.7 C)] 99.9 F (37.7 C) (10/22 2114) Pulse Rate:  [78-102] 102 (10/22 2114) Resp:  [16-18] 16 (10/22 2114) BP: (148-184)/(56-68) 181/68 mmHg (10/22 2114) SpO2:  [95 %-99 %] 99 % (10/22 2114)  Intake/Output from previous day: 10/21 0701 - 10/22 0700 In: 440 [P.O.:440] Out: 1800 [Urine:1800] Intake/Output this shift:     Recent Labs  09/05/13 0547 09/05/13 0800 09/06/13 0418 09/07/13 0512  HGB 7.3* 7.8* 7.2* 7.4*    Recent Labs  09/06/13 0418 09/07/13 0512  WBC 8.6 7.2  RBC 2.30* 2.35*  HCT 21.4* 22.1*  PLT 230 285    Recent Labs  09/06/13 0418 09/07/13 0512  NA 134* 137  K 4.0 4.0  CL 102 103  CO2 22 23  BUN 14 16  CREATININE 0.99 0.95  GLUCOSE 122* 99  CALCIUM 8.1* 8.6   No results found for this basename: LABPT, INR,  in the last 72 hours  Pleasant,NAD, A&O HEENT: atraumatic Chest: equal expansions respirations nono-labored Abdomen: Soft NT LUE: splint clean and dry, mild edema of digits and dorsal hand, diffuse OA changes present about DIP's and PIP's, sensation and refill intact, median, radial,ulnar nerves intact  Assessment/Plan:  4 Days Post-Op Procedure(s) (LRB): LEFT TOTAL ELBOW ARTHROPLASTY (Left) Patient Active Problem List   Diagnosis Date Noted  . Acute blood loss anemia 09/05/2013  . Elbow fracture, left 09/05/2013  . Left acetabular fracture 09/05/2013  . Ankle fracture, left 09/05/2013  . HTN (hypertension) 09/03/2013  . HLD (hyperlipidemia) 09/03/2013  . Peripheral vascular disease, unspecified 05/10/2012  . Chronic total occlusion of artery of the extremities 01/19/2012    Will  plan for D/C tomorrow to [facility], encourage elevation and diligent finger rom to operative hand. NON weight bearing to LUE.Patient to F/U November 5 with DR. Gramig Donaldson Richter L 09/07/2013, 9:15 PM

## 2013-09-07 NOTE — Progress Notes (Signed)
   Subjective: 4 Days Post-Op Procedure(s) (LRB): LEFT TOTAL ELBOW ARTHROPLASTY (Left) Patient reports pain as moderate.   Patient seen in rounds without Dr. Darrelyn Hillock. Patient is well, and has had no acute complaints or problems other than discomfort in her left hip and left elbow. No issues overnight. Continues to have difficulty maneuvering from bed to chair. Denies SOB and chest pain.   Plan is to go Skilled nursing facility after hospital stay.  Objective: Vital signs in last 24 hours: Temp:  [98.2 F (36.8 C)-98.6 F (37 C)] 98.4 F (36.9 C) (10/22 0749) Pulse Rate:  [78-95] 95 (10/22 0749) Resp:  [16-18] 16 (10/22 0749) BP: (146-184)/(56-76) 184/68 mmHg (10/22 0749) SpO2:  [95 %-97 %] 97 % (10/22 0749)  Intake/Output from previous day:  Intake/Output Summary (Last 24 hours) at 09/07/13 0800 Last data filed at 09/07/13 0721  Gross per 24 hour  Intake    440 ml  Output   2350 ml  Net  -1910 ml    Intake/Output this shift: Total I/O In: -  Out: 550 [Urine:550]  Labs:  Recent Labs  09/05/13 0547 09/05/13 0800 09/06/13 0418 09/07/13 0512  HGB 7.3* 7.8* 7.2* 7.4*    Recent Labs  09/06/13 0418 09/07/13 0512  WBC 8.6 7.2  RBC 2.30* 2.35*  HCT 21.4* 22.1*  PLT 230 285    Recent Labs  09/06/13 0418 09/07/13 0512  NA 134* 137  K 4.0 4.0  CL 102 103  CO2 22 23  BUN 14 16  CREATININE 0.99 0.95  GLUCOSE 122* 99  CALCIUM 8.1* 8.6    EXAM General - Patient is Alert and Oriented Extremity - Neurologically intact Neurovascular intact Sensation intact distally Dorsiflexion/Plantar flexion intact Dressing/Incision - clean, dry Motor Function - intact, moving foot, toes, and fingers well on exam.   Past Medical History  Diagnosis Date  . Hypertension   . Stroke     History of TIA  . History of angina   . Peripheral vascular disease     Assessment/Plan: 4 Days Post-Op Procedure(s) (LRB): LEFT TOTAL ELBOW ARTHROPLASTY (Left) Principal  Problem:   Elbow fracture, left Active Problems:   Peripheral vascular disease, unspecified   HTN (hypertension)   HLD (hyperlipidemia)   Acute blood loss anemia   Left acetabular fracture   Ankle fracture, left  Estimated body mass index is 25.24 kg/(m^2) as calculated from the following:   Height as of this encounter: 5\' 2"  (1.575 m).   Weight as of this encounter: 62.6 kg (138 lb 0.1 oz). Advance diet Up with therapy Plan for discharge tomorrow  DVT Prophylaxis - Plavix NWB left LE and left UE  Cheynne is doing fair today. Her and her family have some concerns about her leaving today. We will continue to monitor Hgb today. Trending up. She continues to be asymptomatic. Will prepare for discharge to [facility] tomorrow. Will have therapy with her today, in particular with bed to chair transfers.   Kyrillos Adams LAUREN 09/07/2013, 8:00 AM

## 2013-09-08 DIAGNOSIS — Z8673 Personal history of transient ischemic attack (TIA), and cerebral infarction without residual deficits: Secondary | ICD-10-CM | POA: Diagnosis not present

## 2013-09-08 DIAGNOSIS — D62 Acute posthemorrhagic anemia: Secondary | ICD-10-CM | POA: Diagnosis not present

## 2013-09-08 DIAGNOSIS — S42309D Unspecified fracture of shaft of humerus, unspecified arm, subsequent encounter for fracture with routine healing: Secondary | ICD-10-CM | POA: Diagnosis not present

## 2013-09-08 DIAGNOSIS — I739 Peripheral vascular disease, unspecified: Secondary | ICD-10-CM | POA: Diagnosis not present

## 2013-09-08 DIAGNOSIS — F329 Major depressive disorder, single episode, unspecified: Secondary | ICD-10-CM | POA: Diagnosis not present

## 2013-09-08 DIAGNOSIS — Z471 Aftercare following joint replacement surgery: Secondary | ICD-10-CM | POA: Diagnosis not present

## 2013-09-08 DIAGNOSIS — IMO0002 Reserved for concepts with insufficient information to code with codable children: Secondary | ICD-10-CM | POA: Diagnosis not present

## 2013-09-08 DIAGNOSIS — S3289XA Fracture of other parts of pelvis, initial encounter for closed fracture: Secondary | ICD-10-CM | POA: Diagnosis not present

## 2013-09-08 DIAGNOSIS — S72009D Fracture of unspecified part of neck of unspecified femur, subsequent encounter for closed fracture with routine healing: Secondary | ICD-10-CM | POA: Diagnosis not present

## 2013-09-08 DIAGNOSIS — S42309S Unspecified fracture of shaft of humerus, unspecified arm, sequela: Secondary | ICD-10-CM | POA: Diagnosis not present

## 2013-09-08 DIAGNOSIS — Z9181 History of falling: Secondary | ICD-10-CM | POA: Diagnosis not present

## 2013-09-08 DIAGNOSIS — M1990 Unspecified osteoarthritis, unspecified site: Secondary | ICD-10-CM | POA: Diagnosis not present

## 2013-09-08 DIAGNOSIS — I10 Essential (primary) hypertension: Secondary | ICD-10-CM | POA: Diagnosis not present

## 2013-09-08 DIAGNOSIS — Z9889 Other specified postprocedural states: Secondary | ICD-10-CM | POA: Diagnosis not present

## 2013-09-08 DIAGNOSIS — M6281 Muscle weakness (generalized): Secondary | ICD-10-CM | POA: Diagnosis not present

## 2013-09-08 DIAGNOSIS — I2510 Atherosclerotic heart disease of native coronary artery without angina pectoris: Secondary | ICD-10-CM | POA: Diagnosis not present

## 2013-09-08 DIAGNOSIS — S32509A Unspecified fracture of unspecified pubis, initial encounter for closed fracture: Secondary | ICD-10-CM | POA: Diagnosis not present

## 2013-09-08 DIAGNOSIS — R269 Unspecified abnormalities of gait and mobility: Secondary | ICD-10-CM | POA: Diagnosis not present

## 2013-09-08 DIAGNOSIS — IMO0001 Reserved for inherently not codable concepts without codable children: Secondary | ICD-10-CM | POA: Diagnosis not present

## 2013-09-08 DIAGNOSIS — S52123A Displaced fracture of head of unspecified radius, initial encounter for closed fracture: Secondary | ICD-10-CM | POA: Diagnosis not present

## 2013-09-08 DIAGNOSIS — R279 Unspecified lack of coordination: Secondary | ICD-10-CM | POA: Diagnosis not present

## 2013-09-08 DIAGNOSIS — S5290XA Unspecified fracture of unspecified forearm, initial encounter for closed fracture: Secondary | ICD-10-CM | POA: Diagnosis not present

## 2013-09-08 DIAGNOSIS — B029 Zoster without complications: Secondary | ICD-10-CM | POA: Diagnosis not present

## 2013-09-08 DIAGNOSIS — M79609 Pain in unspecified limb: Secondary | ICD-10-CM | POA: Diagnosis not present

## 2013-09-08 DIAGNOSIS — Z96629 Presence of unspecified artificial elbow joint: Secondary | ICD-10-CM | POA: Diagnosis not present

## 2013-09-08 LAB — HEMOGLOBIN AND HEMATOCRIT, BLOOD
HCT: 24.3 % — ABNORMAL LOW (ref 36.0–46.0)
Hemoglobin: 8.3 g/dL — ABNORMAL LOW (ref 12.0–15.0)

## 2013-09-08 NOTE — Clinical Social Work Psychosocial (Signed)
Clinical Social Work Department  BRIEF PSYCHOSOCIAL ASSESSMENT  Patient: [name] Number: [id number] Admit date: 09/01/13 Clinical Social Worker Sabino Niemann, MSW Date/Time: 09/06/13 Referred by: Physician Date Referred: 09/06/13 Referred for   SNF Placement   Other Referral:  Interview type: Patient and patient's daughter Other interview type: PSYCHOSOCIAL DATA  Living Status: Husband Admitted from facility:  Level of care:  Primary support name: [name] Primary support relationship to patient: Husband Degree of support available:  Strong and vested  CURRENT CONCERNS  Current Concerns   Post-Acute Placement   Other Concerns:  SOCIAL WORK ASSESSMENT / PLAN  CSW met with pt re: PT recommendation for SNF.   Pt lives with her husband  CSW explained placement process and answered questions.   Pt reports [facility] and [facility]  as her preference - pt is pre-reg   CSW completed FL2 and initiated SNF search.     Assessment/plan status: Information/Referral to [facility]  Other assessment/ plan:  Information/referral to community resources:  SNF   PTAR  PATIENT'S/FAMILY'S RESPONSE TO PLAN OF CARE:  Pt  reports she is agreeable to ST SNF in order to increase strength and independence with mobility prior to returning home  Pt verbalized understanding of placement process and appreciation for CSW assist.   Sabino Niemann, MSW, LCSWA [phone]

## 2013-09-08 NOTE — Progress Notes (Signed)
Physical Therapy Treatment Patient Details Name: [name] MRN: [id number] DOB: 08/20/1928 Today's Date: 09/08/2013 Time: 8657-8469 PT Time Calculation (min): 21 min  PT Assessment / Plan / Recommendation  History of Present Illness Fall resulting in L elbow fx, now s/p reconstruction, and L acetabular protrusio fx, now NWB left arm and left leg.    PT Comments   The pt is progressing slowly with therapy due to pain with mobility and multiple limbs NWB.  She tolerated scoot transfer well and followed all instructions given to the best of her ability.  She continues to be appropriate for SNF level rehab at discharge.    Follow Up Recommendations  SNF     Does the patient have the potential to tolerate intense rehabilitation    NA  Barriers to Discharge   None      Equipment Recommendations  Hospital bed;Wheelchair cushion (measurements PT);Wheelchair (measurements PT);Other (comment)    Recommendations for Other Services   None  Frequency Min 3X/week   Progress towards PT Goals Progress towards PT goals: Progressing toward goals  Plan Discharge plan needs to be updated    Precautions / Restrictions Precautions Precautions: Fall Restrictions LUE Weight Bearing: Non weight bearing LLE Weight Bearing: Non weight bearing   Pertinent Vitals/Pain See vitals flow sheet.     Mobility  Bed Mobility Supine to Sit: 1: +2 Total assist;With rails;HOB elevated Supine to Sit: Patient Percentage: 20% Sitting - Scoot to Edge of Bed: 1: +2 Total assist Sitting - Scoot to Edge of Bed: Patient Percentage: 30% Details for Bed Mobility Assistance: two person assist for supine to sit transfers to support trunk and help progress left leg over to the side of the bed.   Transfers Transfers: Lateral/Scoot Wellsite geologist Transfers: 1: +2 Total assist;From elevated surface;With upper extremity assistance;With armrests Squat Pivot Transfers: Patient Percentage: 30% Lateral/Scoot  Transfers: 1: +2 Total assist;With armrests removed;From elevated surface Lateral Transfers: Patient Percentage: 30% Details for Transfer Assistance: using draw pad on bed we assist pt in scooting to her right into a slightly lower recliner chair with the armrest lowered.   Verbal cues for arm placement/reaching and weight shifting forward.      Exercises Total Joint Exercises Ankle Circles/Pumps: AROM;Both;10 reps;Supine   PT Goals (current goals can now be found in the care plan section)    Visit Information  Last PT Received On: 09/08/13 Assistance Needed: +2 History of Present Illness: Fall resulting in L elbow fx, now s/p reconstruction, and L acetabular protrusio fx, now NWB left arm and left leg.     Subjective Data  Subjective: Pt reports pain with most mobility.  Also reports right leg and arm are not great either due to arthritis in the leg and old crush injury to right arm.     Cognition  Cognition Arousal/Alertness: Awake/alert Behavior During Therapy: WFL for tasks assessed/performed Overall Cognitive Status: Within Functional Limits for tasks assessed       End of Session PT - End of Session Activity Tolerance: Patient limited by pain Patient left: in chair;with call bell/phone within reach;with family/visitor present Nurse Communication: Mobility status     Lurena Joiner B. Monterio Bob, PT, DPT [phone]   09/08/2013, 2:09 PM

## 2013-09-08 NOTE — Discharge Summary (Signed)
Physician Discharge Summary  Patient ID: [name] MRN: [id number] DOB/AGE: [birth year] 79 y.o.  Admit date: 09/01/2013 Discharge date: 09/08/2013  Admission Diagnoses:Comminuted Supracondylar Fracture of Left Humerus;Fracture of Left Acetabulum with Protrusio Acetabulae and questionable stress FX of left Fibula.  Discharge Diagnoses: Comminuted Supracondylar Fracture of Left Humerus,Closed; Fracture of Left Acetabulum with Protrusio Acetabulae and questionable stress Fracture of distal Fibula. Principal Problem:   Elbow fracture, left Active Problems:   Acute blood loss anemia   Peripheral vascular disease, unspecified   HTN (hypertension)   HLD (hyperlipidemia)   Left acetabular fracture   Ankle fracture, left    Discharged Condition: good  Hospital Course: Total Elbow Arthroplasty by Dr. Amanda Pea and Dr. Darrelyn Hillock treated her Acetabular FX with Bed Rest.  Consults: rehabilitation medicine  Significant Diagnostic Studies: labs: CBC to follow her Gbg and Xrays to follow her Acetabular Fracture.  Treatments: surgery: Total Elbow Arthroplasty on the Left.  Discharge Exam: Blood pressure 168/75, pulse 89, temperature 97.7 F (36.5 C), temperature source Oral, resp. rate 18, height 5\' 2"  (1.575 m), weight 62.6 kg (138 lb 0.1 oz), SpO2 98.00%. Extremities: Homans sign is negative, no sign of DVT  Disposition: 01-Home or Self Care  Discharge Orders   Future Appointments Provider Department Dept Phone   11/28/2013 11:00 AM [facility] [place] [address] [phone]   11/28/2013 11:30 AM [facility] [place] [facility] [address] [phone]   11/28/2013 12:00 PM Carma Lair Nickel, NP [facility] [phone]   Future Orders Complete By Expires   Call MD / Call 911  As directed    Comments:     If you experience chest pain or shortness of breath, CALL 911 and be transported to the hospital emergency room.  If you  develope a fever above 101 F, pus (white drainage) or increased drainage or redness at the wound, or calf pain, call your surgeon's office.   Constipation Prevention  As directed    Comments:     Drink plenty of fluids.  Prune juice may be helpful.  You may use a stool softener, such as Colace (over the counter) 100 mg twice a day.  Use MiraLax (over the counter) for constipation as needed.   Diet general  As directed    Discharge wound care:  As directed    Comments:     If you have a hip bandage, keep it clean and dry.  Change your bandage as instructed by your health care providers.  If your bandage has been discontinued, keep your incision clean and dry.  Pat dry after bathing.  DO NOT put lotion or powder on your incision.   DO NOT drive, shower or take a tub bath until instructed by your physician  As directed    Increase activity slowly as tolerated  As directed        Medication List         acetaminophen 500 MG tablet  Commonly known as:  TYLENOL  Take 1,000 mg by mouth every 6 (six) hours as needed for pain. Patient took this medication for her pain.     amitriptyline 25 MG tablet  Commonly known as:  ELAVIL  Take 25 mg by mouth at bedtime.     aspirin 81 MG tablet  Take 81 mg by mouth daily.     bisacodyl 10 MG suppository  Commonly known as:  DULCOLAX  Place 1 suppository (10 mg total) rectally daily as  needed.     clopidogrel 75 MG tablet  Commonly known as:  PLAVIX  Take 75 mg by mouth daily.     HYDROCHLOROTHIAZIDE PO  Take by mouth.     HYDROcodone-acetaminophen 5-325 MG per tablet  Commonly known as:  NORCO/VICODIN  Take 1-2 tablets by mouth every 4 (four) hours as needed.     metoprolol 50 MG tablet  Commonly known as:  LOPRESSOR  Take 50 mg by mouth 2 (two) times daily.     metoprolol succinate 25 MG 24 hr tablet  Commonly known as:  TOPROL-XL  Take 50 mg by mouth every morning. Patient took this medication at 8:30 am this morning.     multivitamin  tablet  Take 1 tablet by mouth daily.     polyethylene glycol packet  Commonly known as:  MIRALAX / GLYCOLAX  Take 17 g by mouth daily as needed.     valsartan-hydrochlorothiazide 160-12.5 MG per tablet  Commonly known as:  DIOVAN-HCT  Take 0.5 tablets by mouth daily.           Follow-up Information   Follow up with Maryellen Dowdle A, MD. Schedule an appointment as soon as possible for a visit in 10 days.   Specialty:  Orthopedic Surgery   Contact information:   [address] [place] [address] [phone]       Signed: Jacki Cones 09/08/2013, 6:43 AM

## 2013-09-08 NOTE — Progress Notes (Signed)
Subjective: 5 Days Post-Op Procedure(s) (LRB): LEFT TOTAL ELBOW ARTHROPLASTY (Left) Patient reports pain as 3 on 0-10 scale. Doing about the same. Hbg 7.4 and stable. Case discussed with her daughter. Will go to [facility] today. Portable Xray yesterday shows no further issues with her Left Hip.   Objective: Vital signs in last 24 hours: Temp:  [97.7 F (36.5 C)-99.9 F (37.7 C)] 97.7 F (36.5 C) (10/23 0536) Pulse Rate:  [83-102] 89 (10/23 0536) Resp:  [16-18] 18 (10/23 0536) BP: (164-184)/(63-75) 168/75 mmHg (10/23 0536) SpO2:  [97 %-99 %] 98 % (10/23 0536)  Intake/Output from previous day: 10/22 0701 - 10/23 0700 In: -  Out: 1250 [Urine:1250] Intake/Output this shift: Total I/O In: -  Out: 400 [Urine:400]   Recent Labs  09/05/13 0800 09/06/13 0418 09/07/13 0512  HGB 7.8* 7.2* 7.4*    Recent Labs  09/06/13 0418 09/07/13 0512  WBC 8.6 7.2  RBC 2.30* 2.35*  HCT 21.4* 22.1*  PLT 230 285    Recent Labs  09/06/13 0418 09/07/13 0512  NA 134* 137  K 4.0 4.0  CL 102 103  CO2 22 23  BUN 14 16  CREATININE 0.99 0.95  GLUCOSE 122* 99  CALCIUM 8.1* 8.6   No results found for this basename: LABPT, INR,  in the last 72 hours  Dorsiflexion/Plantar flexion intact Compartment soft  Assessment/Plan: 5 Days Post-Op Procedure(s) (LRB): LEFT TOTAL ELBOW ARTHROPLASTY (Left) Discharge to SNF. DC Today. See [place] Monday Nov.3.  Shaqueena Mauceri A 09/08/2013, 6:39 AM

## 2013-09-08 NOTE — Progress Notes (Signed)
Called report to [facility]. 

## 2013-09-09 ENCOUNTER — Non-Acute Institutional Stay (SKILLED_NURSING_FACILITY): Payer: Medicare Other | Admitting: Internal Medicine

## 2013-09-09 DIAGNOSIS — S32402S Unspecified fracture of left acetabulum, sequela: Secondary | ICD-10-CM

## 2013-09-09 DIAGNOSIS — I10 Essential (primary) hypertension: Secondary | ICD-10-CM | POA: Diagnosis not present

## 2013-09-09 DIAGNOSIS — IMO0002 Reserved for concepts with insufficient information to code with codable children: Secondary | ICD-10-CM | POA: Diagnosis not present

## 2013-09-09 DIAGNOSIS — S42402S Unspecified fracture of lower end of left humerus, sequela: Secondary | ICD-10-CM

## 2013-09-09 DIAGNOSIS — D62 Acute posthemorrhagic anemia: Secondary | ICD-10-CM | POA: Diagnosis not present

## 2013-09-09 DIAGNOSIS — S42309S Unspecified fracture of shaft of humerus, unspecified arm, sequela: Secondary | ICD-10-CM

## 2013-09-10 LAB — CULTURE, BLOOD (ROUTINE X 2): Culture: NO GROWTH

## 2013-09-22 DIAGNOSIS — S3289XA Fracture of other parts of pelvis, initial encounter for closed fracture: Secondary | ICD-10-CM | POA: Diagnosis not present

## 2013-09-22 DIAGNOSIS — S5290XA Unspecified fracture of unspecified forearm, initial encounter for closed fracture: Secondary | ICD-10-CM | POA: Diagnosis not present

## 2013-09-22 DIAGNOSIS — S52123A Displaced fracture of head of unspecified radius, initial encounter for closed fracture: Secondary | ICD-10-CM | POA: Diagnosis not present

## 2013-09-30 DIAGNOSIS — S52123A Displaced fracture of head of unspecified radius, initial encounter for closed fracture: Secondary | ICD-10-CM | POA: Diagnosis not present

## 2013-10-03 NOTE — Progress Notes (Signed)
Patient ID: [name], female   DOB: 02-22-1928, 79 y.o.   MRN: [id number]        HISTORY & PHYSICAL  DATE: 09/09/2013   FACILITY: [facility]  LEVEL OF CARE: SNF (31)  ALLERGIES:  Allergies  Allergen Reactions  . Promethazine Hcl     IV  Drug only, unknown   . Sulfa Antibiotics Other (See Comments)    Unknown     CHIEF COMPLAINT:  Manage left humerus fracture, acute blood loss anemia, and left acetabular fracture.    HISTORY OF PRESENT ILLNESS:  The patient is an 79 year-old, Caucasian female.    LEFT HUMERUS FRACTURE:  The patient sustained a comminuted supracondylar fracture of the left humerus.  She underwent total elbow arthroplasty on the left and tolerated the procedure well.  She is admitted to this facility for short-term rehabilitation.  She denies pain.    LEFT ACETABULAR FRACTURE:  Orthopedic surgeon recommended bed rest for the left acetabular fracture.  She is currently undergoing rehabilitation.    ANEMIA:  Postoperatively, patient suffered acute blood loss.   The anemia has been stable. The patient denies fatigue, melena or hematochezia.  The patient is currently not on iron.  Last hemoglobins are:    7.2, 8.6.    PAST MEDICAL HISTORY :  Past Medical History  Diagnosis Date  . Hypertension   . Stroke     History of TIA  . History of angina   . Peripheral vascular disease     PAST SURGICAL HISTORY: Past Surgical History  Procedure Laterality Date  . Abdominal hysterectomy    . Pr vein bypass graft,aorto-fem-pop  09-13-09    Left Fem-pop  . Joint replacement      knee  . Femoral artery stent  12-11-10    Left SFA  . Total elbow arthroplasty Left 09/03/2013    Procedure: LEFT TOTAL ELBOW ARTHROPLASTY;  Surgeon: Dominica Severin, MD;  Location: [facility] OR;  Service: Orthopedics;  Laterality: Left;    SOCIAL HISTORY:  reports that she quit smoking about 66 years ago. Her smoking use included Cigarettes. She smoked 0.00 packs per day.  She has never used smokeless tobacco. She reports that she does not drink alcohol or use illicit drugs.  FAMILY HISTORY: None  CURRENT MEDICATIONS: Reviewed per MAR  REVIEW OF SYSTEMS:  See HPI otherwise 14 point ROS is negative.  PHYSICAL EXAMINATION  VS:  T 98.9       P 104      RR 16      BP 98/70      POX 96%        WT (Lb)  GENERAL: no acute distress, normal body habitus EYES: conjunctivae normal, sclerae normal, normal eye lids MOUTH/THROAT: lips without lesions,no lesions in the mouth,tongue is without lesions,uvula elevates in midline NECK: supple, trachea midline, no neck masses, no thyroid tenderness, no thyromegaly LYMPHATICS: no LAN in the neck, no supraclavicular LAN RESPIRATORY: breathing is even & unlabored, BS CTAB CARDIAC: RRR, no murmur,no extra heart sounds, no edema GI:  ABDOMEN: abdomen soft, normal BS, no masses, no tenderness  LIVER/SPLEEN: no hepatomegaly, no splenomegaly MUSCULOSKELETAL: HEAD: normal to inspection & palpation BACK: no kyphosis, scoliosis or spinal processes tenderness EXTREMITIES: LEFT UPPER EXTREMITY: extremity has a soft cast   RIGHT UPPER EXTREMITY:  full range of motion, normal strength & tone LEFT LOWER EXTREMITY: strength intact, range of motion minimal   RIGHT LOWER EXTREMITY: strength intact, range of  motion minimal   PSYCHIATRIC: the patient is alert & oriented to person, affect & behavior appropriate  LABS/RADIOLOGY: BMP normal.    Hemoglobin 7.4, otherwise CBC normal.    ASSESSMENT/PLAN:  Left humerus fracture.  Status post total elbow arthroplasty.    Left acetabular fracture.  Continue bed rest.     Acute blood loss anemia.  Reassess hemoglobin level.    Hypertension.  Adequately controlled.    Check CBC and BMP.    I have reviewed patient's medical records received at admission/from hospitalization.  CPT CODE: 51761

## 2013-10-04 ENCOUNTER — Other Ambulatory Visit: Payer: Self-pay | Admitting: Vascular Surgery

## 2013-10-04 DIAGNOSIS — I739 Peripheral vascular disease, unspecified: Secondary | ICD-10-CM

## 2013-10-04 DIAGNOSIS — Z48812 Encounter for surgical aftercare following surgery on the circulatory system: Secondary | ICD-10-CM

## 2013-10-17 DIAGNOSIS — S42309D Unspecified fracture of shaft of humerus, unspecified arm, subsequent encounter for fracture with routine healing: Secondary | ICD-10-CM | POA: Diagnosis not present

## 2013-10-17 DIAGNOSIS — Z471 Aftercare following joint replacement surgery: Secondary | ICD-10-CM | POA: Diagnosis not present

## 2013-10-17 DIAGNOSIS — I2510 Atherosclerotic heart disease of native coronary artery without angina pectoris: Secondary | ICD-10-CM | POA: Diagnosis not present

## 2013-10-17 DIAGNOSIS — S72009D Fracture of unspecified part of neck of unspecified femur, subsequent encounter for closed fracture with routine healing: Secondary | ICD-10-CM | POA: Diagnosis not present

## 2013-10-17 DIAGNOSIS — Z8673 Personal history of transient ischemic attack (TIA), and cerebral infarction without residual deficits: Secondary | ICD-10-CM | POA: Diagnosis not present

## 2013-10-17 DIAGNOSIS — R269 Unspecified abnormalities of gait and mobility: Secondary | ICD-10-CM | POA: Diagnosis not present

## 2013-10-17 DIAGNOSIS — F329 Major depressive disorder, single episode, unspecified: Secondary | ICD-10-CM | POA: Diagnosis not present

## 2013-10-17 DIAGNOSIS — I739 Peripheral vascular disease, unspecified: Secondary | ICD-10-CM | POA: Diagnosis not present

## 2013-10-17 DIAGNOSIS — IMO0001 Reserved for inherently not codable concepts without codable children: Secondary | ICD-10-CM | POA: Diagnosis not present

## 2013-10-17 DIAGNOSIS — Z9181 History of falling: Secondary | ICD-10-CM | POA: Diagnosis not present

## 2013-10-17 DIAGNOSIS — Z96629 Presence of unspecified artificial elbow joint: Secondary | ICD-10-CM | POA: Diagnosis not present

## 2013-10-17 DIAGNOSIS — B029 Zoster without complications: Secondary | ICD-10-CM | POA: Diagnosis not present

## 2013-10-17 DIAGNOSIS — R279 Unspecified lack of coordination: Secondary | ICD-10-CM | POA: Diagnosis not present

## 2013-10-17 DIAGNOSIS — M6281 Muscle weakness (generalized): Secondary | ICD-10-CM | POA: Diagnosis not present

## 2013-10-17 DIAGNOSIS — M1990 Unspecified osteoarthritis, unspecified site: Secondary | ICD-10-CM | POA: Diagnosis not present

## 2013-10-17 DIAGNOSIS — Z9889 Other specified postprocedural states: Secondary | ICD-10-CM | POA: Diagnosis not present

## 2013-10-17 DIAGNOSIS — I10 Essential (primary) hypertension: Secondary | ICD-10-CM | POA: Diagnosis not present

## 2013-10-17 DIAGNOSIS — S32509A Unspecified fracture of unspecified pubis, initial encounter for closed fracture: Secondary | ICD-10-CM | POA: Diagnosis not present

## 2013-10-18 DIAGNOSIS — S32509A Unspecified fracture of unspecified pubis, initial encounter for closed fracture: Secondary | ICD-10-CM | POA: Diagnosis not present

## 2013-10-18 DIAGNOSIS — IMO0001 Reserved for inherently not codable concepts without codable children: Secondary | ICD-10-CM | POA: Diagnosis not present

## 2013-10-25 ENCOUNTER — Non-Acute Institutional Stay (SKILLED_NURSING_FACILITY): Payer: Medicare Other | Admitting: Adult Health

## 2013-10-25 DIAGNOSIS — B029 Zoster without complications: Secondary | ICD-10-CM

## 2013-10-25 DIAGNOSIS — F3289 Other specified depressive episodes: Secondary | ICD-10-CM

## 2013-10-25 DIAGNOSIS — I10 Essential (primary) hypertension: Secondary | ICD-10-CM | POA: Diagnosis not present

## 2013-10-25 DIAGNOSIS — S42309D Unspecified fracture of shaft of humerus, unspecified arm, subsequent encounter for fracture with routine healing: Secondary | ICD-10-CM | POA: Diagnosis not present

## 2013-10-25 DIAGNOSIS — F32A Depression, unspecified: Secondary | ICD-10-CM

## 2013-10-25 DIAGNOSIS — S72009D Fracture of unspecified part of neck of unspecified femur, subsequent encounter for closed fracture with routine healing: Secondary | ICD-10-CM | POA: Diagnosis not present

## 2013-10-25 DIAGNOSIS — S32402D Unspecified fracture of left acetabulum, subsequent encounter for fracture with routine healing: Secondary | ICD-10-CM

## 2013-10-25 DIAGNOSIS — F329 Major depressive disorder, single episode, unspecified: Secondary | ICD-10-CM

## 2013-10-25 DIAGNOSIS — S42402D Unspecified fracture of lower end of left humerus, subsequent encounter for fracture with routine healing: Secondary | ICD-10-CM

## 2013-10-26 DIAGNOSIS — D62 Acute posthemorrhagic anemia: Secondary | ICD-10-CM | POA: Diagnosis not present

## 2013-10-26 DIAGNOSIS — Z471 Aftercare following joint replacement surgery: Secondary | ICD-10-CM | POA: Diagnosis not present

## 2013-10-26 DIAGNOSIS — R269 Unspecified abnormalities of gait and mobility: Secondary | ICD-10-CM | POA: Diagnosis not present

## 2013-10-26 DIAGNOSIS — Z96629 Presence of unspecified artificial elbow joint: Secondary | ICD-10-CM | POA: Diagnosis not present

## 2013-10-26 DIAGNOSIS — F419 Anxiety disorder, unspecified: Secondary | ICD-10-CM | POA: Insufficient documentation

## 2013-10-26 DIAGNOSIS — B029 Zoster without complications: Secondary | ICD-10-CM | POA: Diagnosis not present

## 2013-10-26 DIAGNOSIS — IMO0001 Reserved for inherently not codable concepts without codable children: Secondary | ICD-10-CM | POA: Diagnosis not present

## 2013-10-26 NOTE — Progress Notes (Signed)
Patient ID: [name], female   DOB: 28-Feb-1928, 79 y.o.   MRN: [id number]              PROGRESS NOTE  DATE: 10/25/2013   FACILITY: [facility]  LEVEL OF CARE: SNF (31)  Acute Visit  CHIEF COMPLAINT:  Discharge Notes  HISTORY OF PRESENT ILLNESS: This is an 79 year old female who is for discharge home with Home health PT, OT, Nursing and CNA. DME: wheelchair 16 X 18, with elevated leg rest and desk top arm swing, walker attachment platform for left arm and cushion. She was admitted to [facility] on 09/08/13 from [facility]. She fell and sustained a fracture of left acetabulum, treated with bedrest, and fracture of left elbow fracture S/P left total elbow arthroplasty.  Patient was admitted to this facility for short-term rehabilitation after the patient's recent hospitalization.  Patient has completed SNF rehabilitation and therapy has cleared the patient for discharge.  Reassessment of ongoing problem(s):  HTN: Pt 's HTN remains stable.  Denies CP, sob, DOE, pedal edema, headaches, dizziness or visual disturbances.  No complications from the medications currently being used.  Last BP : 134/62  DEPRESSION: The depression remains stable. Patient denies ongoing feelings of sadness, insomnia, anedhonia or lack of appetite. No complications reported from the medications currently being used. Staff do not report behavioral problems.   PAST MEDICAL HISTORY : Reviewed.  No changes.  CURRENT MEDICATIONS: Reviewed per [facility]  REVIEW OF SYSTEMS:  GENERAL: no change in appetite, no fatigue, no weight changes, no fever, chills or weakness RESPIRATORY: no cough, SOB, DOE, wheezing, hemoptysis CARDIAC: no chest pain, edema or palpitations GI: no abdominal pain, diarrhea, constipation, heart burn, nausea or vomiting  PHYSICAL EXAMINATION  VS:  T97.9       P84       RR20      BP134/62      POX 98%       WT126.8 (Lb)  GENERAL: no acute distress, normal body  habitus SKIN: erythematous rashes on her right flank EYES: conjunctivae normal, sclerae normal, normal eye lids NECK: supple, trachea midline, no neck masses, no thyroid tenderness, no thyromegaly LYMPHATICS: no LAN in the neck, no supraclavicular LAN RESPIRATORY: breathing is even & unlabored, BS CTAB CARDIAC: RRR, no murmur,no extra heart sounds, no edema GI: abdomen soft, normal BS, no masses, no tenderness, no hepatomegaly, no splenomegaly PSYCHIATRIC: the patient is alert & oriented to person, affect & behavior appropriate  LABS/RADIOLOGY: 10/05/13  Wbc 9.2  hgb 9.1  hct 29.9 09/22/13  Wbc 6.8  hgb 8.9  hct 29.5 NA 136  K 4.0  Glucose 111  BUN 32  Creatinine 1.1  CA 9.6 09/12/13  Wbc 9.4  hgb 9.5  hct 31.6  NA 137 K 4.1  Glucose 103  BUN 25  Creatinine 1.0  CA 9.3   ASSESSMENT/PLAN:   Fracture of Left Acetabulum - for Home health rehabilitation  Left elbow fracture S/P total elbow arthroplasty - for Home health rehabilitation  Shingles - continue Valtrex  Hypertension - well-controlled; continue HCTZ, Diovan, Toprol XL and Lopressor  Depression - stable; continue Elavil    I have filled out patient's discharge paperwork and written prescriptions.  Patient will receive home health PT, OT, ST, nursing and CNA.  DME provided: wheelchair 16 X 18, with elevated leg rest and desk top arm swing, walker attachment platform for left arm and cushion.  Total discharge time: Greater than 30 minutes  Discharge time involved coordination of the discharge process with social worker, nursing staff and therapy department. Medical justification for home health services/DME verified.  CPT CODE: [address]

## 2013-10-28 DIAGNOSIS — IMO0001 Reserved for inherently not codable concepts without codable children: Secondary | ICD-10-CM | POA: Diagnosis not present

## 2013-10-28 DIAGNOSIS — S42409A Unspecified fracture of lower end of unspecified humerus, initial encounter for closed fracture: Secondary | ICD-10-CM | POA: Diagnosis not present

## 2013-11-01 DIAGNOSIS — S42409A Unspecified fracture of lower end of unspecified humerus, initial encounter for closed fracture: Secondary | ICD-10-CM | POA: Diagnosis not present

## 2013-11-15 DIAGNOSIS — S32409A Unspecified fracture of unspecified acetabulum, initial encounter for closed fracture: Secondary | ICD-10-CM | POA: Diagnosis not present

## 2013-11-15 DIAGNOSIS — S42409A Unspecified fracture of lower end of unspecified humerus, initial encounter for closed fracture: Secondary | ICD-10-CM | POA: Diagnosis not present

## 2013-11-28 ENCOUNTER — Encounter (HOSPITAL_COMMUNITY): Payer: Medicare Other

## 2013-11-28 ENCOUNTER — Other Ambulatory Visit (HOSPITAL_COMMUNITY): Payer: Medicare Other

## 2013-11-28 ENCOUNTER — Ambulatory Visit: Payer: Medicare Other | Admitting: Family

## 2013-12-05 DIAGNOSIS — S42409A Unspecified fracture of lower end of unspecified humerus, initial encounter for closed fracture: Secondary | ICD-10-CM | POA: Diagnosis not present

## 2013-12-05 DIAGNOSIS — IMO0001 Reserved for inherently not codable concepts without codable children: Secondary | ICD-10-CM | POA: Diagnosis not present

## 2013-12-07 ENCOUNTER — Encounter: Payer: Self-pay | Admitting: Family

## 2013-12-08 ENCOUNTER — Other Ambulatory Visit (HOSPITAL_COMMUNITY): Payer: Medicare Other

## 2013-12-08 ENCOUNTER — Ambulatory Visit: Payer: Medicare Other | Admitting: Family

## 2013-12-08 ENCOUNTER — Encounter (HOSPITAL_COMMUNITY): Payer: Medicare Other

## 2013-12-12 ENCOUNTER — Encounter: Payer: Self-pay | Admitting: Family

## 2013-12-13 ENCOUNTER — Ambulatory Visit (INDEPENDENT_AMBULATORY_CARE_PROVIDER_SITE_OTHER): Payer: Medicare Other | Admitting: Family

## 2013-12-13 ENCOUNTER — Ambulatory Visit (INDEPENDENT_AMBULATORY_CARE_PROVIDER_SITE_OTHER)
Admission: RE | Admit: 2013-12-13 | Discharge: 2013-12-13 | Disposition: A | Discharge status: Home / Self Care | Payer: Medicare Other | Source: Ambulatory Visit | Attending: Vascular Surgery | Admitting: Vascular Surgery

## 2013-12-13 ENCOUNTER — Ambulatory Visit (HOSPITAL_COMMUNITY)
Admission: RE | Admit: 2013-12-13 | Discharge: 2013-12-13 | Disposition: A | Discharge status: Home / Self Care | Payer: Medicare Other | Source: Ambulatory Visit | Attending: Vascular Surgery | Admitting: Vascular Surgery

## 2013-12-13 ENCOUNTER — Encounter: Payer: Self-pay | Admitting: Family

## 2013-12-13 VITALS — BP 142/78 | HR 76 | Temp 97.1°F | Resp 16 | Ht 62.0 in | Wt 121.0 lb

## 2013-12-13 DIAGNOSIS — Z87891 Personal history of nicotine dependence: Secondary | ICD-10-CM | POA: Insufficient documentation

## 2013-12-13 DIAGNOSIS — I739 Peripheral vascular disease, unspecified: Secondary | ICD-10-CM | POA: Insufficient documentation

## 2013-12-13 DIAGNOSIS — Z9889 Other specified postprocedural states: Secondary | ICD-10-CM | POA: Insufficient documentation

## 2013-12-13 DIAGNOSIS — Z48812 Encounter for surgical aftercare following surgery on the circulatory system: Secondary | ICD-10-CM

## 2013-12-13 DIAGNOSIS — E785 Hyperlipidemia, unspecified: Secondary | ICD-10-CM | POA: Diagnosis not present

## 2013-12-13 DIAGNOSIS — I10 Essential (primary) hypertension: Secondary | ICD-10-CM | POA: Insufficient documentation

## 2013-12-13 NOTE — Patient Instructions (Signed)
Peripheral Vascular Disease Peripheral Vascular Disease (PVD), also called Peripheral Arterial Disease (PAD), is a circulation problem caused by cholesterol (atherosclerotic plaque) deposits in the arteries. PVD commonly occurs in the lower extremities (legs) but it can occur in other areas of the body, such as your arms. The cholesterol buildup in the arteries reduces blood flow which can cause pain and other serious problems. The presence of PVD can place a person at risk for Coronary Artery Disease (CAD).  CAUSES  Causes of PVD can be many. It is usually associated with more than one risk factor such as:   High Cholesterol.  Smoking.  Diabetes.  Lack of exercise or inactivity.  High blood pressure (hypertension).  Obesity.  Family history. SYMPTOMS   When the lower extremities are affected, patients with PVD may experience:  Leg pain with exertion or physical activity. This is called INTERMITTENT CLAUDICATION. This may present as cramping or numbness with physical activity. The location of the pain is associated with the level of blockage. For example, blockage at the abdominal level (distal abdominal aorta) may result in buttock or hip pain. Lower leg arterial blockage may result in calf pain.  As PVD becomes more severe, pain can develop with less physical activity.  In people with severe PVD, leg pain may occur at rest.  Other PVD signs and symptoms:  Leg numbness or weakness.  Coldness in the affected leg or foot, especially when compared to the other leg.  A change in leg color.  Patients with significant PVD are more prone to ulcers or sores on toes, feet or legs. These may take longer to heal or may reoccur. The ulcers or sores can become infected.  If signs and symptoms of PVD are ignored, gangrene may occur. This can result in the loss of toes or loss of an entire limb.  Not all leg pain is related to PVD. Other medical conditions can cause leg pain such  as:  Blood clots (embolism) or Deep Vein Thrombosis.  Inflammation of the blood vessels (vasculitis).  Spinal stenosis. DIAGNOSIS  Diagnosis of PVD can involve several different types of tests. These can include:  Pulse Volume Recording Method (PVR). This test is simple, painless and does not involve the use of X-rays. PVR involves measuring and comparing the blood pressure in the arms and legs. An ABI (Ankle-Brachial Index) is calculated. The normal ratio of blood pressures is 1. As this number becomes smaller, it indicates more severe disease.  < 0.95  indicates significant narrowing in one or more leg vessels.  <0.8 there will usually be pain in the foot, leg or buttock with exercise.  <0.4 will usually have pain in the legs at rest.  <0.25  usually indicates limb threatening PVD.  Doppler detection of pulses in the legs. This test is painless and checks to see if you have a pulses in your legs/feet.  A dye or contrast material (a substance that highlights the blood vessels so they show up on x-ray) may be given to help your caregiver better see the arteries for the following tests. The dye is eliminated from your body by the kidney's. Your caregiver may order blood work to check your kidney function and other laboratory values before the following tests are performed:  Magnetic Resonance Angiography (MRA). An MRA is a picture study of the blood vessels and arteries. The MRA machine uses a large magnet to produce images of the blood vessels.  Computed Tomography Angiography (CTA). A CTA is a   specialized x-ray that looks at how the blood flows in your blood vessels. An IV may be inserted into your arm so contrast dye can be injected.  Angiogram. Is a procedure that uses x-rays to look at your blood vessels. This procedure is minimally invasive, meaning a small incision (cut) is made in your groin. A small tube (catheter) is then inserted into the artery of your groin. The catheter is  guided to the blood vessel or artery your caregiver wants to examine. Contrast dye is injected into the catheter. X-rays are then taken of the blood vessel or artery. After the images are obtained, the catheter is taken out. TREATMENT  Treatment of PVD involves many interventions which may include:  Lifestyle changes:  Quitting smoking.  Exercise.  Following a low fat, low cholesterol diet.  Control of diabetes.  Foot care is very important to the PVD patient. Good foot care can help prevent infection.  Medication:  Cholesterol-lowering medicine.  Blood pressure medicine.  Anti-platelet drugs.  Certain medicines may reduce symptoms of Intermittent Claudication.  Interventional/Surgical options:  Angioplasty. An Angioplasty is a procedure that inflates a balloon in the blocked artery. This opens the blocked artery to improve blood flow.  Stent Implant. A wire mesh tube (stent) is placed in the artery. The stent expands and stays in place, allowing the artery to remain open.  Peripheral Bypass Surgery. This is a surgical procedure that reroutes the blood around a blocked artery to help improve blood flow. This type of procedure may be performed if Angioplasty or stent implants are not an option. SEEK IMMEDIATE MEDICAL CARE IF:   You develop pain or numbness in your arms or legs.  Your arm or leg turns cold, becomes blue in color.  You develop redness, warmth, swelling and pain in your arms or legs. MAKE SURE YOU:   Understand these instructions.  Will watch your condition.  Will get help right away if you are not doing well or get worse. Document Released: 12/11/2004 Document Revised: 01/26/2012 Document Reviewed: 11/07/2008 [employer] Patient Information 2014 [employer].  

## 2013-12-13 NOTE — Progress Notes (Signed)
[facility] [place] HISTORY AND PHYSICAL -PAD  History of Present Illness [name] is a 80 y.o. female patient of [name] status post a left SFA to popliteal bypass graft that was done in 2010, she also had left SFA stent in June 2011 and 2012. She returns today for LE arterial evaluation. As best can be determined from pt's description, she did not have claudication symptoms before she fell last October, since her fall she has not walked much. Pt denies non healing wounds. Pt reports a couple of TIA's about 15 years ago. Pt is receiving home physical therapy twice weekly, and does exercises on her own that she was taught.   Pt reports New Medical or Surgical History: fell last October and fractured left pelvis, had left elbow replacement, fractured left ankle and wrist.  Pt Diabetic: No Pt smoker: non-smoker  Pt meds include: Statin :No, statins cause aching Betablocker: Yes ASA: Yes Other anticoagulants/antiplatelets: Plavix  Past Medical History  Diagnosis Date  . Hypertension   . Stroke     History of TIA  . History of angina   . Peripheral vascular disease   . Fall Oct. 15, 2014    Fx. pelvis, Left Hip, Left arm    Social History History  Substance Use Topics  . Smoking status: Former Smoker    Types: Cigarettes    Quit date: 11/17/1946  . Smokeless tobacco: Never Used  . Alcohol Use: No    Family History History reviewed. No pertinent family history.  Past Surgical History  Procedure Laterality Date  . Abdominal hysterectomy    . Pr vein bypass graft,aorto-fem-pop  09-13-09    Left Fem-pop  . Femoral artery stent  12-11-10    Left SFA  . Total elbow arthroplasty Left 09/03/2013    Procedure: LEFT TOTAL ELBOW ARTHROPLASTY;  Surgeon: Roseanne Kaufman, MD;  Location: [facility];  Service: Orthopedics;  Laterality: Left;  . Joint replacement      knee  . Joint replacement Left Oct. 17, 2014    Elbow    Allergies  Allergen  Reactions  . Promethazine Hcl     IV  Drug only, unknown   . Sulfa Antibiotics Other (See Comments)    Unknown     Current Outpatient Prescriptions  Medication Sig Dispense Refill  . acetaminophen (TYLENOL) 500 MG tablet Take 1,000 mg by mouth every 6 (six) hours as needed for pain. Patient took this medication for her pain.      Marland Kitchen amitriptyline (ELAVIL) 25 MG tablet Take 25 mg by mouth at bedtime.       Marland Kitchen aspirin 81 MG tablet Take 81 mg by mouth daily.      . bisacodyl (DULCOLAX) 10 MG suppository Place 1 suppository (10 mg total) rectally daily as needed.  12 suppository  0  . clopidogrel (PLAVIX) 75 MG tablet Take 75 mg by mouth daily.      Marland Kitchen HYDROCHLOROTHIAZIDE PO Take by mouth.      Marland Kitchen HYDROcodone-acetaminophen (NORCO/VICODIN) 5-325 MG per tablet Take 1-2 tablets by mouth every 4 (four) hours as needed.  80 tablet  0  . metoprolol (LOPRESSOR) 50 MG tablet Take 50 mg by mouth 2 (two) times daily.       . metoprolol succinate (TOPROL-XL) 25 MG 24 hr tablet Take 50 mg by mouth every morning. Patient took this medication at 8:30 am this morning.      . Multiple Vitamin (MULTIVITAMIN) tablet Take 1 tablet  by mouth daily.      . polyethylene glycol (MIRALAX / GLYCOLAX) packet Take 17 g by mouth daily as needed.  14 each  0  . valsartan-hydrochlorothiazide (DIOVAN-HCT) 160-12.5 MG per tablet Take 0.5 tablets by mouth daily.       No current facility-administered medications for this visit.    ROS: See HPI for pertinent positives and negatives.   Physical Examination  Filed Vitals:   12/13/13 1439  BP: 142/78  Pulse: 76  Temp: 97.1 F (36.2 C)  Resp: 16    General: A&O x 3, WDWN,  Gait: using quad cane, slow and deliberate Eyes: Pupils equal Pulmonary: CTAB, without wheezes ,rhonchi, does have some rales in base of left lung fields. Cardiac: regular Rythm , without detected murmur          Carotid Bruits Left Right   Negative Negative  Aorta: is not palpable Radial  pulses: are 2+ palpable and =                           VASCULAR EXAM: Extremities without ischemic changes  without Gangrene; without open wounds.                                                                                                          LE Pulses LEFT RIGHT       FEMORAL   palpable   palpable        POPLITEAL  not palpable   not palpable       POSTERIOR TIBIAL   palpable    Not palpable        DORSALIS PEDIS      ANTERIOR TIBIAL  palpable   palpable    Abdomen: soft, NT, no masses. Skin: no rashes, no ulcers noted. Musculoskeletal: no muscle wasting or atrophy.  Neurologic: A&O X 3; Appropriate Affect ; SENSATION: normal; MOTOR FUNCTION:  moving all extremities equally, motor strength 3/5 throughout. Speech is fluent/normal. CN 2-12 intact.  Non-Invasive Vascular Imaging: DATE: 12/13/2013 ABI: RIGHT 0.99, Waveforms: mono and triphasic;  LEFT 1.11, Waveforms: triphasic DUPLEX SCAN OF LLE BYPASS: Mild diffuse disease throughout the native SFA with about 50% stenosis in the proximal segment. Mild hyperplasia throughout the stent. Widely patent graft.  ASSESSMENT: [name] is a 80 y.o. female who presents status post a left SFA to popliteal bypass graft that was done in 2010, she also had left SFA stent in June 2011 and 2012. Her ABI's are normal, she does have some mild mottling on the plantar surface of the left foot, lateral and distal 1/4 of foot. This has improved according to the patient's daughter. The LLE Duplex reveals no significant arterial stenosis. Her main problems seem to be risk of falling and recovering from the injuries sustained from a fall last October.  PLAN:  I discussed in depth with the patient the nature of atherosclerosis, and emphasized the importance of maximal medical management including strict control of blood pressure, blood glucose, and lipid levels, obtaining regular  exercise, and cessation of smoking.  The patient is aware  that without maximal medical management the underlying atherosclerotic disease process will progress, limiting the benefit of any interventions. Based on the patient's vascular studies and examination, pt will return to clinic in 1 year for ABI's and LLE arterial Duplex. She knows to return sooner if needed.  The patient was given information about PAD including signs, symptoms, treatment, what symptoms should prompt the patient to seek immediate medical care, and risk reduction measures to take.  Clemon Chambers, RN, MSN, FNP-C [facility] Phone: [phone]  Clinic MD: Kellie Simmering  12/13/2013 2:51 PM

## 2013-12-15 DIAGNOSIS — J209 Acute bronchitis, unspecified: Secondary | ICD-10-CM | POA: Diagnosis not present

## 2013-12-20 DIAGNOSIS — IMO0001 Reserved for inherently not codable concepts without codable children: Secondary | ICD-10-CM | POA: Diagnosis not present

## 2013-12-20 DIAGNOSIS — S42409A Unspecified fracture of lower end of unspecified humerus, initial encounter for closed fracture: Secondary | ICD-10-CM | POA: Diagnosis not present

## 2014-02-14 DIAGNOSIS — S42409A Unspecified fracture of lower end of unspecified humerus, initial encounter for closed fracture: Secondary | ICD-10-CM | POA: Diagnosis not present

## 2014-02-14 DIAGNOSIS — S32409A Unspecified fracture of unspecified acetabulum, initial encounter for closed fracture: Secondary | ICD-10-CM | POA: Diagnosis not present

## 2014-03-27 DIAGNOSIS — H04129 Dry eye syndrome of unspecified lacrimal gland: Secondary | ICD-10-CM | POA: Diagnosis not present

## 2014-03-27 DIAGNOSIS — H35349 Macular cyst, hole, or pseudohole, unspecified eye: Secondary | ICD-10-CM | POA: Diagnosis not present

## 2014-03-27 DIAGNOSIS — Z961 Presence of intraocular lens: Secondary | ICD-10-CM | POA: Diagnosis not present

## 2014-03-27 DIAGNOSIS — H3530 Unspecified macular degeneration: Secondary | ICD-10-CM | POA: Diagnosis not present

## 2014-04-03 DIAGNOSIS — L97909 Non-pressure chronic ulcer of unspecified part of unspecified lower leg with unspecified severity: Secondary | ICD-10-CM | POA: Diagnosis not present

## 2014-04-12 DIAGNOSIS — L989 Disorder of the skin and subcutaneous tissue, unspecified: Secondary | ICD-10-CM | POA: Diagnosis not present

## 2014-04-12 DIAGNOSIS — L02419 Cutaneous abscess of limb, unspecified: Secondary | ICD-10-CM | POA: Diagnosis not present

## 2014-04-12 DIAGNOSIS — L03119 Cellulitis of unspecified part of limb: Secondary | ICD-10-CM | POA: Diagnosis not present

## 2014-05-08 DIAGNOSIS — L989 Disorder of the skin and subcutaneous tissue, unspecified: Secondary | ICD-10-CM | POA: Diagnosis not present

## 2014-05-10 ENCOUNTER — Other Ambulatory Visit: Payer: Self-pay | Admitting: Adult Health

## 2014-05-17 DIAGNOSIS — H35349 Macular cyst, hole, or pseudohole, unspecified eye: Secondary | ICD-10-CM | POA: Diagnosis not present

## 2014-05-17 DIAGNOSIS — H43819 Vitreous degeneration, unspecified eye: Secondary | ICD-10-CM | POA: Diagnosis not present

## 2014-06-05 DIAGNOSIS — L570 Actinic keratosis: Secondary | ICD-10-CM | POA: Diagnosis not present

## 2014-06-05 DIAGNOSIS — D239 Other benign neoplasm of skin, unspecified: Secondary | ICD-10-CM | POA: Diagnosis not present

## 2014-06-05 DIAGNOSIS — C44721 Squamous cell carcinoma of skin of unspecified lower limb, including hip: Secondary | ICD-10-CM | POA: Diagnosis not present

## 2014-06-05 DIAGNOSIS — D485 Neoplasm of uncertain behavior of skin: Secondary | ICD-10-CM | POA: Diagnosis not present

## 2014-06-17 DIAGNOSIS — C44722 Squamous cell carcinoma of skin of right lower limb, including hip: Secondary | ICD-10-CM

## 2014-06-17 HISTORY — DX: Squamous cell carcinoma of skin of right lower limb, including hip: C44.722

## 2014-08-10 DIAGNOSIS — C44721 Squamous cell carcinoma of skin of unspecified lower limb, including hip: Secondary | ICD-10-CM | POA: Diagnosis not present

## 2014-09-06 DIAGNOSIS — Z23 Encounter for immunization: Secondary | ICD-10-CM | POA: Diagnosis not present

## 2014-11-08 DIAGNOSIS — Z85828 Personal history of other malignant neoplasm of skin: Secondary | ICD-10-CM | POA: Diagnosis not present

## 2014-11-08 DIAGNOSIS — L814 Other melanin hyperpigmentation: Secondary | ICD-10-CM | POA: Diagnosis not present

## 2014-11-08 DIAGNOSIS — L821 Other seborrheic keratosis: Secondary | ICD-10-CM | POA: Diagnosis not present

## 2014-12-18 ENCOUNTER — Encounter: Payer: Self-pay | Admitting: Family

## 2014-12-19 ENCOUNTER — Encounter: Payer: Self-pay | Admitting: Family

## 2014-12-19 ENCOUNTER — Ambulatory Visit (HOSPITAL_COMMUNITY)
Admission: RE | Admit: 2014-12-19 | Discharge: 2014-12-19 | Disposition: A | Discharge status: Home / Self Care | Payer: Medicare Other | Source: Ambulatory Visit | Attending: Family | Admitting: Family

## 2014-12-19 ENCOUNTER — Ambulatory Visit (INDEPENDENT_AMBULATORY_CARE_PROVIDER_SITE_OTHER)
Admission: RE | Admit: 2014-12-19 | Discharge: 2014-12-19 | Disposition: A | Discharge status: Home / Self Care | Payer: Medicare Other | Source: Ambulatory Visit | Attending: Family | Admitting: Family

## 2014-12-19 ENCOUNTER — Ambulatory Visit (INDEPENDENT_AMBULATORY_CARE_PROVIDER_SITE_OTHER): Payer: Medicare Other | Admitting: Family

## 2014-12-19 VITALS — BP 134/76 | HR 78 | Resp 16 | Ht 62.0 in | Wt 135.0 lb

## 2014-12-19 DIAGNOSIS — Z9889 Other specified postprocedural states: Secondary | ICD-10-CM | POA: Insufficient documentation

## 2014-12-19 DIAGNOSIS — I739 Peripheral vascular disease, unspecified: Secondary | ICD-10-CM | POA: Diagnosis not present

## 2014-12-19 DIAGNOSIS — I998 Other disorder of circulatory system: Secondary | ICD-10-CM | POA: Diagnosis not present

## 2014-12-19 DIAGNOSIS — Z48812 Encounter for surgical aftercare following surgery on the circulatory system: Secondary | ICD-10-CM

## 2014-12-19 DIAGNOSIS — Z95828 Presence of other vascular implants and grafts: Secondary | ICD-10-CM

## 2014-12-19 DIAGNOSIS — I70229 Atherosclerosis of native arteries of extremities with rest pain, unspecified extremity: Secondary | ICD-10-CM

## 2014-12-19 NOTE — Progress Notes (Signed)
[facility] [place] HISTORY AND PHYSICAL -PAD  History of Present Illness [name] is a 81 y.o. female patient of [name] who is status post left SFA to popliteal bypass graft that was done in 2010, she also had left SFA stent in June 2011 and 2012. She returns today for LE arterial evaluation. As best can be determined from pt's description, she did not have claudication symptoms before she fell October 2014, since her fall she has not walked much. Pt denies non healing wounds. Pt reports a couple of TIA's about 2000. Pt was receiving home physical therapy twice weekly, and does exercises on her own that she was taught.  Pt reports New Medical or Surgical History: fell October 2014 and fractured left pelvis, had left elbow replacement, fractured left ankle and wrist. About November 2015 her left leg became increasingly numb and weak.  Pt Diabetic: No Pt smoker: non-smoker  Pt meds include: Statin :No, statins cause aching Betablocker: Yes ASA: Yes Other anticoagulants/antiplatelets: Plavix    Past Medical History  Diagnosis Date  . Hypertension   . Stroke     History of TIA  . History of angina   . Peripheral vascular disease   . Fall Oct. 15, 2014    Fx. pelvis, Left Hip, Left Elbow  . DVT (deep venous thrombosis)   . Cancer Aug. 2015    SCC - Right Lower Leg    Social History History  Substance Use Topics  . Smoking status: Former Smoker    Types: Cigarettes    Quit date: 11/17/1946  . Smokeless tobacco: Never Used  . Alcohol Use: No    Family History Family History  Problem Relation Age of Onset  . Heart disease Father     Heart Disease before age 8  . Hyperlipidemia Father   . Hypertension Father   . Alcohol abuse Father   . Hyperlipidemia Sister   . Hypertension Sister   . Heart disease Brother   . Hyperlipidemia Brother   . Hypertension Brother   . Deep vein thrombosis Brother   . Heart disease Son     Heart  Disease before age 21  . Hyperlipidemia Son   . Hypertension Son   . Heart attack Son   . Diabetes Son   . Hypertension Son     Past Surgical History  Procedure Laterality Date  . Abdominal hysterectomy    . Pr vein bypass graft,aorto-fem-pop  09-13-09    Left Fem-pop  . Femoral artery stent  12-11-10    Left SFA  . Total elbow arthroplasty Left 09/03/2013    Procedure: LEFT TOTAL ELBOW ARTHROPLASTY;  Surgeon: Roseanne Kaufman, MD;  Location: [facility];  Service: Orthopedics;  Laterality: Left;  . Joint replacement      knee  . Joint replacement Left Oct. 17, 2014    Elbow ( pt fell 08-31-13 )    Allergies  Allergen Reactions  . Promethazine Hcl     IV  Drug only, unknown   . Sulfa Antibiotics Other (See Comments)    Unknown     Current Outpatient Prescriptions  Medication Sig Dispense Refill  . acetaminophen (TYLENOL) 500 MG tablet Take 1,000 mg by mouth every 6 (six) hours as needed for pain. Patient took this medication for her pain.    Marland Kitchen amitriptyline (ELAVIL) 25 MG tablet Take 25 mg by mouth at bedtime.     Marland Kitchen aspirin 81 MG tablet Take 81 mg by mouth  daily.    . Cholecalciferol ([employer] VIT D 5000 HIGH-POTENCY PO) Take by mouth daily.    . clopidogrel (PLAVIX) 75 MG tablet Take 75 mg by mouth daily.    Marland Kitchen HYDROCHLOROTHIAZIDE PO Take by mouth.    . metoprolol (LOPRESSOR) 50 MG tablet Take 50 mg by mouth every morning.     . metoprolol succinate (TOPROL-XL) 25 MG 24 hr tablet Take 25 mg by mouth at bedtime. Patient took this medication at 8:30 am this morning.    . Multiple Vitamin (MULTIVITAMIN) tablet Take 1 tablet by mouth daily.    . Multiple Vitamins-Minerals (EYE VITAMINS PO) Take by mouth daily.    . valsartan-hydrochlorothiazide (DIOVAN-HCT) 160-12.5 MG per tablet Take 0.5 tablets by mouth daily.    . bisacodyl (DULCOLAX) 10 MG suppository Place 1 suppository (10 mg total) rectally daily as needed. (Patient not taking: Reported on 12/19/2014) 12 suppository 0  .  HYDROcodone-acetaminophen (NORCO/VICODIN) 5-325 MG per tablet Take 1-2 tablets by mouth every 4 (four) hours as needed. (Patient not taking: Reported on 12/19/2014) 80 tablet 0  . polyethylene glycol (MIRALAX / GLYCOLAX) packet Take 17 g by mouth daily as needed. (Patient not taking: Reported on 12/19/2014) 14 each 0   No current facility-administered medications for this visit.    ROS: See HPI for pertinent positives and negatives.   Physical Examination  Filed Vitals:   12/19/14 1527  BP: 134/76  Pulse: 78  Resp: 16  Height: 5\' 2"  (1.575 m)  Weight: 135 lb (61.236 kg)  SpO2: 98%   Body mass index is 24.69 kg/(m^2).  General: A&O x 3, WDWN  Gait: using quad cane, slow and deliberate Eyes: Pupils equal Pulmonary: CTAB, without wheezes ,rhonchi, does have some rales in base of left lung fields. Cardiac: regular Rythm , without detected murmur     Carotid Bruits Left Right   positive Negative  Aorta: is not palpable Radial pulses: are 2+ palpable and =   VASCULAR EXAM: Extremities with ischemic changes: moderate cyanosis on sole of left foot,  without Gangrene; without open wounds.     LE Pulses LEFT RIGHT   FEMORAL not  palpable  faintly palpable    POPLITEAL not palpable  not palpable   POSTERIOR TIBIAL  not palpable   Not palpable    DORSALIS PEDIS  ANTERIOR TIBIAL not palpable  not palpable    Abdomen: soft, NT, no palpable masses. Skin: no rashes, no ulcers. Musculoskeletal: no muscle wasting or atrophy. Neurologic: A&O X 3; Appropriate Affect ; SENSATION: normal; MOTOR FUNCTION: moving all extremities equally, motor strength 3/5 throughout. Speech is fluent/normal. CN 2-12 intact.    Non-Invasive Vascular Imaging: DATE:  12/19/2014 LOWER EXTREMITY ARTERIAL EVALUATION    INDICATION: Evaluation of left lower extremity stent and bypass graft.    PREVIOUS INTERVENTION(S): Left distal superficial femoral artery to popliteal artery bypass graft 09/13/2009. Left superficial femoral artery stents placed 04/25/2010 and 12/11/2010.    DUPLEX EXAM: Duplex evaluation of the lower extremity arterial system to include the common femoral, superficial femoral, popliteal, and tibial arteries and bypass graft(s) and/or stent(s) if present.      FINDINGS:                                       RIGHT LOWER EXTREMITY:  NA    LEFT LOWER EXTREMITY:  Moderate diffuse disease is observed throughout the native superficial femoral  with a >50% stenosis in the mid segment. The superficial femoral artery stent appears to have mild hyperplasia present throughout its length. The left femoropopliteal bypass graft could not be visualized; most likely due to low flow rates.       See the attached diagram for velocities.   IMPRESSION:  >50% stenosis of the left proximal to mid superficial femoral artery segment. The left femoropopliteal artery bypass was not visualized.    ANKLE/BRACHIAL INDEX - Right = NA                     Left =   0.23         (Please see complete report)                     ASSESSMENT: [name] is a 81 y.o. female who is status post left SFA to popliteal bypass graft that was done in 2010, she also had left SFA stent in June 2011 and 2012. About November 2015 her left leg became increasingly numb and weak. Pt declined Duplex and ABI in right leg due to right calf having had recent skin cancer excision. Left ABI today is 0.23 with monophasic waveform, a year ago it was 1.11 with triphasic waveforms.  Sole of left foot is moderately cyanotic, cool, no ulcers.  Dr. Kellie Simmering spoke with pt and daughter and examined pt.    PLAN:  I discussed in depth with the patient the nature of  atherosclerosis, and emphasized the importance of maximal medical management including strict control of blood pressure, blood glucose, and lipid levels, obtaining regular exercise, and continued cessation of smoking.  The patient is aware that without maximal medical management the underlying atherosclerotic disease process will progress, limiting the benefit of any interventions.  Based on the patient's vascular studies and examination, pt will be scheduled for aortobifem angiogram by [name] on 12/26/14, right groin approach, for left lower extremity critical limb ischemia.   The patient was given information about PAD including signs, symptoms, treatment, what symptoms should prompt the patient to seek immediate medical care, and risk reduction measures to take.  Clemon Chambers, RN, MSN, FNP-C [facility] Phone: [phone]  Clinic MD: Kellie Simmering  12/19/2014  3:27 PM

## 2014-12-19 NOTE — Patient Instructions (Signed)

## 2014-12-20 ENCOUNTER — Other Ambulatory Visit: Payer: Self-pay

## 2014-12-21 ENCOUNTER — Encounter (HOSPITAL_COMMUNITY): Payer: Self-pay | Admitting: Pharmacy Technician

## 2014-12-21 DIAGNOSIS — M25552 Pain in left hip: Secondary | ICD-10-CM | POA: Diagnosis not present

## 2014-12-25 MED ORDER — SODIUM CHLORIDE 0.9 % IV SOLN
INTRAVENOUS | Status: DC
  Administered 2014-12-26: 10:00:00 via INTRAVENOUS

## 2014-12-26 ENCOUNTER — Other Ambulatory Visit: Payer: Self-pay | Admitting: *Deleted

## 2014-12-26 ENCOUNTER — Encounter (HOSPITAL_COMMUNITY): Payer: Self-pay | Admitting: Surgery

## 2014-12-26 ENCOUNTER — Encounter (HOSPITAL_COMMUNITY)
Admission: RE | Disposition: A | Discharge status: Home / Self Care | Payer: Self-pay | Source: Ambulatory Visit | Attending: Surgery

## 2014-12-26 ENCOUNTER — Ambulatory Visit (HOSPITAL_COMMUNITY)
Admission: RE | Admit: 2014-12-26 | Discharge: 2014-12-26 | Disposition: A | Discharge status: Home / Self Care | Payer: Medicare Other | Source: Ambulatory Visit | Attending: Surgery | Admitting: Surgery

## 2014-12-26 DIAGNOSIS — I70402 Unspecified atherosclerosis of autologous vein bypass graft(s) of the extremities, left leg: Secondary | ICD-10-CM | POA: Diagnosis not present

## 2014-12-26 DIAGNOSIS — Z8673 Personal history of transient ischemic attack (TIA), and cerebral infarction without residual deficits: Secondary | ICD-10-CM | POA: Insufficient documentation

## 2014-12-26 DIAGNOSIS — Z96652 Presence of left artificial knee joint: Secondary | ICD-10-CM | POA: Insufficient documentation

## 2014-12-26 DIAGNOSIS — I70441 Atherosclerosis of autologous vein bypass graft(s) of the left leg with ulceration of thigh: Secondary | ICD-10-CM

## 2014-12-26 DIAGNOSIS — Z9862 Peripheral vascular angioplasty status: Secondary | ICD-10-CM

## 2014-12-26 DIAGNOSIS — Z7982 Long term (current) use of aspirin: Secondary | ICD-10-CM | POA: Insufficient documentation

## 2014-12-26 DIAGNOSIS — T8585XA Stenosis due to internal prosthetic devices, implants and grafts, not elsewhere classified, initial encounter: Secondary | ICD-10-CM

## 2014-12-26 DIAGNOSIS — Z7902 Long term (current) use of antithrombotics/antiplatelets: Secondary | ICD-10-CM | POA: Diagnosis not present

## 2014-12-26 DIAGNOSIS — I10 Essential (primary) hypertension: Secondary | ICD-10-CM | POA: Insufficient documentation

## 2014-12-26 DIAGNOSIS — Z85828 Personal history of other malignant neoplasm of skin: Secondary | ICD-10-CM | POA: Insufficient documentation

## 2014-12-26 DIAGNOSIS — Z79899 Other long term (current) drug therapy: Secondary | ICD-10-CM | POA: Insufficient documentation

## 2014-12-26 DIAGNOSIS — I739 Peripheral vascular disease, unspecified: Secondary | ICD-10-CM

## 2014-12-26 HISTORY — PX: ABDOMINAL AORTAGRAM: SHX5454

## 2014-12-26 HISTORY — PX: INSERTION OF ILIAC STENT: SHX6256

## 2014-12-26 LAB — POCT I-STAT, CHEM 8
BUN: 26 mg/dL — ABNORMAL HIGH (ref 6–23)
Calcium, Ion: 1.24 mmol/L (ref 1.13–1.30)
Chloride: 105 mmol/L (ref 96–112)
Creatinine, Ser: 1.3 mg/dL — ABNORMAL HIGH (ref 0.50–1.10)
Glucose, Bld: 89 mg/dL (ref 70–99)
HEMATOCRIT: 41 % (ref 36.0–46.0)
HEMOGLOBIN: 13.9 g/dL (ref 12.0–15.0)
Potassium: 4 mmol/L (ref 3.5–5.1)
SODIUM: 140 mmol/L (ref 135–145)
TCO2: 21 mmol/L (ref 0–100)

## 2014-12-26 LAB — POCT ACTIVATED CLOTTING TIME
ACTIVATED CLOTTING TIME: 226 s
Activated Clotting Time: 214 seconds
Activated Clotting Time: 146 seconds

## 2014-12-26 SURGERY — ABDOMINAL AORTAGRAM

## 2014-12-26 MED ORDER — LIDOCAINE HCL (PF) 1 % IJ SOLN
INTRAMUSCULAR | Status: AC
  Filled 2014-12-26: qty 30

## 2014-12-26 MED ORDER — ALUM & MAG HYDROXIDE-SIMETH 200-200-20 MG/5ML PO SUSP: 15.0000 mL | ORAL | Status: DC | PRN

## 2014-12-26 MED ORDER — METOPROLOL TARTRATE 1 MG/ML IV SOLN: 2.0000 mg | INTRAVENOUS | Status: DC | PRN

## 2014-12-26 MED ORDER — GUAIFENESIN-DM 100-10 MG/5ML PO SYRP: 15.0000 mL | ORAL_SOLUTION | ORAL | Status: DC | PRN

## 2014-12-26 MED ORDER — HYDRALAZINE HCL 20 MG/ML IJ SOLN
INTRAMUSCULAR | Status: AC
  Filled 2014-12-26: qty 1

## 2014-12-26 MED ORDER — ACETAMINOPHEN 325 MG RE SUPP: 325.0000 mg | RECTAL | Status: DC | PRN

## 2014-12-26 MED ORDER — HEPARIN (PORCINE) IN NACL 2-0.9 UNIT/ML-% IJ SOLN
INTRAMUSCULAR | Status: AC
  Filled 2014-12-26: qty 1000

## 2014-12-26 MED ORDER — OXYCODONE HCL 5 MG PO TABS: 5.0000 mg | ORAL_TABLET | ORAL | Status: DC | PRN

## 2014-12-26 MED ORDER — ONDANSETRON HCL 4 MG/2ML IJ SOLN
4.0000 mg | Freq: Four times a day (QID) | INTRAMUSCULAR | Status: DC | PRN
  Administered 2014-12-26: 4 mg via INTRAVENOUS

## 2014-12-26 MED ORDER — HYDRALAZINE HCL 20 MG/ML IJ SOLN: 5.0000 mg | INTRAMUSCULAR | Status: DC | PRN

## 2014-12-26 MED ORDER — SODIUM CHLORIDE 0.9 % IV SOLN: 1.0000 mL/kg/h | INTRAVENOUS | Status: DC

## 2014-12-26 MED ORDER — PHENOL 1.4 % MT LIQD: 1.0000 | OROMUCOSAL | Status: DC | PRN

## 2014-12-26 MED ORDER — ACETAMINOPHEN 325 MG PO TABS: 325.0000 mg | ORAL_TABLET | ORAL | Status: DC | PRN

## 2014-12-26 MED ORDER — HEPARIN SODIUM (PORCINE) 1000 UNIT/ML IJ SOLN
INTRAMUSCULAR | Status: AC
  Filled 2014-12-26: qty 1

## 2014-12-26 MED ORDER — FENTANYL CITRATE 0.05 MG/ML IJ SOLN
INTRAMUSCULAR | Status: AC
  Filled 2014-12-26: qty 2

## 2014-12-26 MED ORDER — ONDANSETRON HCL 4 MG/2ML IJ SOLN
INTRAMUSCULAR | Status: AC
  Filled 2014-12-26: qty 2

## 2014-12-26 MED ORDER — MIDAZOLAM HCL 2 MG/2ML IJ SOLN
INTRAMUSCULAR | Status: AC
  Filled 2014-12-26: qty 2

## 2014-12-26 SURGICAL SUPPLY — 55 items
ADH SKN CLS APL DERMABOND .7 (GAUZE/BANDAGES/DRESSINGS) ×2
BANDAGE ELASTIC 4 VELCRO ST LF (GAUZE/BANDAGES/DRESSINGS) IMPLANT
BANDAGE ESMARK 6X9 LF (GAUZE/BANDAGES/DRESSINGS) IMPLANT
BNDG CMPR 9X6 STRL LF SNTH (GAUZE/BANDAGES/DRESSINGS)
BNDG ESMARK 6X9 LF (GAUZE/BANDAGES/DRESSINGS)
CANISTER SUCTION 2500CC (MISCELLANEOUS) ×3 IMPLANT
CLIP TI MEDIUM 24 (CLIP) ×3 IMPLANT
CLIP TI WIDE RED SMALL 24 (CLIP) ×3 IMPLANT
COVER SURGICAL LIGHT HANDLE (MISCELLANEOUS) ×3 IMPLANT
CUFF TOURNIQUET SINGLE 24IN (TOURNIQUET CUFF) IMPLANT
CUFF TOURNIQUET SINGLE 34IN LL (TOURNIQUET CUFF) IMPLANT
CUFF TOURNIQUET SINGLE 44IN (TOURNIQUET CUFF) IMPLANT
DERMABOND ADVANCED (GAUZE/BANDAGES/DRESSINGS) ×1
DERMABOND ADVANCED .7 DNX12 (GAUZE/BANDAGES/DRESSINGS) ×2 IMPLANT
DRAIN CHANNEL 15F RND FF W/TCR (WOUND CARE) IMPLANT
DRAPE WARM FLUID 44X44 (DRAPE) ×3 IMPLANT
DRAPE X-RAY CASS 24X20 (DRAPES) IMPLANT
DRSG COVADERM 4X10 (GAUZE/BANDAGES/DRESSINGS) IMPLANT
DRSG COVADERM 4X8 (GAUZE/BANDAGES/DRESSINGS) IMPLANT
ELECT REM PT RETURN 9FT ADLT (ELECTROSURGICAL) ×3
ELECTRODE REM PT RTRN 9FT ADLT (ELECTROSURGICAL) ×2 IMPLANT
EVACUATOR SILICONE 100CC (DRAIN) IMPLANT
GLOVE BIOGEL PI IND STRL 7.5 (GLOVE) ×2 IMPLANT
GLOVE BIOGEL PI INDICATOR 7.5 (GLOVE) ×1
GLOVE SURG SS PI 7.5 STRL IVOR (GLOVE) ×3 IMPLANT
GOWN PREVENTION PLUS XXLARGE (GOWN DISPOSABLE) ×3 IMPLANT
GOWN STRL NON-REIN LRG LVL3 (GOWN DISPOSABLE) ×9 IMPLANT
HEMOSTAT SNOW SURGICEL 2X4 (HEMOSTASIS) IMPLANT
KIT BASIN OR (CUSTOM PROCEDURE TRAY) ×3 IMPLANT
KIT ROOM TURNOVER OR (KITS) ×3 IMPLANT
MARKER GRAFT CORONARY BYPASS (MISCELLANEOUS) IMPLANT
NS IRRIG 1000ML POUR BTL (IV SOLUTION) ×6 IMPLANT
PACK PERIPHERAL VASCULAR (CUSTOM PROCEDURE TRAY) ×3 IMPLANT
PAD ARMBOARD 7.5X6 YLW CONV (MISCELLANEOUS) ×6 IMPLANT
PADDING CAST COTTON 6X4 STRL (CAST SUPPLIES) IMPLANT
SET COLLECT BLD 21X3/4 12 (NEEDLE) IMPLANT
STOPCOCK 4 WAY LG BORE MALE ST (IV SETS) IMPLANT
SUT ETHILON 3 0 PS 1 (SUTURE) IMPLANT
SUT PROLENE 5 0 C 1 24 (SUTURE) ×3 IMPLANT
SUT PROLENE 6 0 BV (SUTURE) ×3 IMPLANT
SUT PROLENE 7 0 BV 1 (SUTURE) IMPLANT
SUT SILK 2 0 SH (SUTURE) ×3 IMPLANT
SUT SILK 3 0 (SUTURE)
SUT SILK 3-0 18XBRD TIE 12 (SUTURE) IMPLANT
SUT VIC AB 2-0 CT1 27 (SUTURE) ×6
SUT VIC AB 2-0 CT1 TAPERPNT 27 (SUTURE) ×4 IMPLANT
SUT VIC AB 3-0 SH 27 (SUTURE) ×6
SUT VIC AB 3-0 SH 27X BRD (SUTURE) ×4 IMPLANT
SUT VICRYL 4-0 PS2 18IN ABS (SUTURE) ×6 IMPLANT
TOWEL OR 17X24 6PK STRL BLUE (TOWEL DISPOSABLE) ×6 IMPLANT
TOWEL OR 17X26 10 PK STRL BLUE (TOWEL DISPOSABLE) ×6 IMPLANT
TRAY FOLEY CATH 16FRSI W/METER (SET/KITS/TRAYS/PACK) ×3 IMPLANT
TUBING EXTENTION W/L.L. (IV SETS) IMPLANT
UNDERPAD 30X30 INCONTINENT (UNDERPADS AND DIAPERS) ×3 IMPLANT
WATER STERILE IRR 1000ML POUR (IV SOLUTION) ×3 IMPLANT

## 2014-12-26 NOTE — H&P (View-Only) (Signed)
[facility] [place] HISTORY AND PHYSICAL -PAD  History of Present Illness [name] is a 81 y.o. female patient of [name] who is status post left SFA to popliteal bypass graft that was done in 2010, she also had left SFA stent in June 2011 and 2012. She returns today for LE arterial evaluation. As best can be determined from pt's description, she did not have claudication symptoms before she fell October 2014, since her fall she has not walked much. Pt denies non healing wounds. Pt reports a couple of TIA's about 2000. Pt was receiving home physical therapy twice weekly, and does exercises on her own that she was taught.  Pt reports New Medical or Surgical History: fell October 2014 and fractured left pelvis, had left elbow replacement, fractured left ankle and wrist. About November 2015 her left leg became increasingly numb and weak.  Pt Diabetic: No Pt smoker: non-smoker  Pt meds include: Statin :No, statins cause aching Betablocker: Yes ASA: Yes Other anticoagulants/antiplatelets: Plavix    Past Medical History  Diagnosis Date  . Hypertension   . Stroke     History of TIA  . History of angina   . Peripheral vascular disease   . Fall Oct. 15, 2014    Fx. pelvis, Left Hip, Left Elbow  . DVT (deep venous thrombosis)   . Cancer Aug. 2015    SCC - Right Lower Leg    Social History History  Substance Use Topics  . Smoking status: Former Smoker    Types: Cigarettes    Quit date: 11/17/1946  . Smokeless tobacco: Never Used  . Alcohol Use: No    Family History Family History  Problem Relation Age of Onset  . Heart disease Father     Heart Disease before age 47  . Hyperlipidemia Father   . Hypertension Father   . Alcohol abuse Father   . Hyperlipidemia Sister   . Hypertension Sister   . Heart disease Brother   . Hyperlipidemia Brother   . Hypertension Brother   . Deep vein thrombosis Brother   . Heart disease Son     Heart  Disease before age 37  . Hyperlipidemia Son   . Hypertension Son   . Heart attack Son   . Diabetes Son   . Hypertension Son     Past Surgical History  Procedure Laterality Date  . Abdominal hysterectomy    . Pr vein bypass graft,aorto-fem-pop  09-13-09    Left Fem-pop  . Femoral artery stent  12-11-10    Left SFA  . Total elbow arthroplasty Left 09/03/2013    Procedure: LEFT TOTAL ELBOW ARTHROPLASTY;  Surgeon: Roseanne Kaufman, MD;  Location: [facility];  Service: Orthopedics;  Laterality: Left;  . Joint replacement      knee  . Joint replacement Left Oct. 17, 2014    Elbow ( pt fell 08-31-13 )    Allergies  Allergen Reactions  . Promethazine Hcl     IV  Drug only, unknown   . Sulfa Antibiotics Other (See Comments)    Unknown     Current Outpatient Prescriptions  Medication Sig Dispense Refill  . acetaminophen (TYLENOL) 500 MG tablet Take 1,000 mg by mouth every 6 (six) hours as needed for pain. Patient took this medication for her pain.    Marland Kitchen amitriptyline (ELAVIL) 25 MG tablet Take 25 mg by mouth at bedtime.     Marland Kitchen aspirin 81 MG tablet Take 81 mg by mouth  daily.    . Cholecalciferol ([employer]S VIT D 5000 HIGH-POTENCY PO) Take by mouth daily.    . clopidogrel (PLAVIX) 75 MG tablet Take 75 mg by mouth daily.    Marland Kitchen HYDROCHLOROTHIAZIDE PO Take by mouth.    . metoprolol (LOPRESSOR) 50 MG tablet Take 50 mg by mouth every morning.     . metoprolol succinate (TOPROL-XL) 25 MG 24 hr tablet Take 25 mg by mouth at bedtime. Patient took this medication at 8:30 am this morning.    . Multiple Vitamin (MULTIVITAMIN) tablet Take 1 tablet by mouth daily.    . Multiple Vitamins-Minerals (EYE VITAMINS PO) Take by mouth daily.    . valsartan-hydrochlorothiazide (DIOVAN-HCT) 160-12.5 MG per tablet Take 0.5 tablets by mouth daily.    . bisacodyl (DULCOLAX) 10 MG suppository Place 1 suppository (10 mg total) rectally daily as needed. (Patient not taking: Reported on 12/19/2014) 12 suppository 0  .  HYDROcodone-acetaminophen (NORCO/VICODIN) 5-325 MG per tablet Take 1-2 tablets by mouth every 4 (four) hours as needed. (Patient not taking: Reported on 12/19/2014) 80 tablet 0  . polyethylene glycol (MIRALAX / GLYCOLAX) packet Take 17 g by mouth daily as needed. (Patient not taking: Reported on 12/19/2014) 14 each 0   No current facility-administered medications for this visit.    ROS: See HPI for pertinent positives and negatives.   Physical Examination  Filed Vitals:   12/19/14 1527  BP: 134/76  Pulse: 78  Resp: 16  Height: 5\' 2"  (1.575 m)  Weight: 135 lb (61.236 kg)  SpO2: 98%   Body mass index is 24.69 kg/(m^2).  General: A&O x 3, WDWN  Gait: using quad cane, slow and deliberate Eyes: Pupils equal Pulmonary: CTAB, without wheezes ,rhonchi, does have some rales in base of left lung fields. Cardiac: regular Rythm , without detected murmur     Carotid Bruits Left Right   positive Negative  Aorta: is not palpable Radial pulses: are 2+ palpable and =   VASCULAR EXAM: Extremities with ischemic changes: moderate cyanosis on sole of left foot,  without Gangrene; without open wounds.     LE Pulses LEFT RIGHT   FEMORAL not  palpable  faintly palpable    POPLITEAL not palpable  not palpable   POSTERIOR TIBIAL  not palpable   Not palpable    DORSALIS PEDIS  ANTERIOR TIBIAL not palpable  not palpable    Abdomen: soft, NT, no palpable masses. Skin: no rashes, no ulcers. Musculoskeletal: no muscle wasting or atrophy. Neurologic: A&O X 3; Appropriate Affect ; SENSATION: normal; MOTOR FUNCTION: moving all extremities equally, motor strength 3/5 throughout. Speech is fluent/normal. CN 2-12 intact.    Non-Invasive Vascular Imaging: DATE:  12/19/2014 LOWER EXTREMITY ARTERIAL EVALUATION    INDICATION: Evaluation of left lower extremity stent and bypass graft.    PREVIOUS INTERVENTION(S): Left distal superficial femoral artery to popliteal artery bypass graft 09/13/2009. Left superficial femoral artery stents placed 04/25/2010 and 12/11/2010.    DUPLEX EXAM: Duplex evaluation of the lower extremity arterial system to include the common femoral, superficial femoral, popliteal, and tibial arteries and bypass graft(s) and/or stent(s) if present.      FINDINGS:                                       RIGHT LOWER EXTREMITY:  NA    LEFT LOWER EXTREMITY:  Moderate diffuse disease is observed throughout the native superficial femoral  with a >50% stenosis in the mid segment. The superficial femoral artery stent appears to have mild hyperplasia present throughout its length. The left femoropopliteal bypass graft could not be visualized; most likely due to low flow rates.       See the attached diagram for velocities.   IMPRESSION:  >50% stenosis of the left proximal to mid superficial femoral artery segment. The left femoropopliteal artery bypass was not visualized.    ANKLE/BRACHIAL INDEX - Right = NA                     Left =   0.23         (Please see complete report)                     ASSESSMENT: [name] is a 81 y.o. female who is status post left SFA to popliteal bypass graft that was done in 2010, she also had left SFA stent in June 2011 and 2012. About November 2015 her left leg became increasingly numb and weak. Pt declined Duplex and ABI in right leg due to right calf having had recent skin cancer excision. Left ABI today is 0.23 with monophasic waveform, a year ago it was 1.11 with triphasic waveforms.  Sole of left foot is moderately cyanotic, cool, no ulcers.  Dr. Kellie Simmering spoke with pt and daughter and examined pt.    PLAN:  I discussed in depth with the patient the nature of  atherosclerosis, and emphasized the importance of maximal medical management including strict control of blood pressure, blood glucose, and lipid levels, obtaining regular exercise, and continued cessation of smoking.  The patient is aware that without maximal medical management the underlying atherosclerotic disease process will progress, limiting the benefit of any interventions.  Based on the patient's vascular studies and examination, pt will be scheduled for aortobifem angiogram by [name] on 12/26/14, right groin approach, for left lower extremity critical limb ischemia.   The patient was given information about PAD including signs, symptoms, treatment, what symptoms should prompt the patient to seek immediate medical care, and risk reduction measures to take.  Clemon Chambers, RN, MSN, FNP-C [facility] Phone: [phone]  Clinic MD: Kellie Simmering  12/19/2014  3:27 PM

## 2014-12-26 NOTE — Research (Signed)
SAFE Informed Consent   Subject Name: Analiz L Los  Subject met inclusion and exclusion criteria.  The informed consent form, study requirements and expectations were reviewed with the subject and questions and concerns were addressed prior to the signing of the consent form.  The subject verbalized understanding of the trail requirements.  The subject agreed to participate in the SAFE trial and signed the informed consent.  The informed consent was obtained prior to performance of any protocol-specific procedures for the subject.  A copy of the signed informed consent was given to the subject and a copy was placed in the subject's medical record.  Shawn Lord 12/26/2014   

## 2014-12-26 NOTE — Op Note (Signed)
Patient name: [name] MRN: [id number] DOB: July 26, 1928 Sex: female  12/26/2014 Pre-operative Diagnosis: Bypass graft stenosis Post-operative diagnosis:  Same Surgeon:  Eldridge Abrahams Procedure Performed:  1.  Ultrasound-guided access, right femoral artery  2.  Abdominal aortogram  3.  Left lower extremity runoff  4.  Stent, left external iliac artery  5.  Drug coated balloon angioplasty, left superficial femoral artery  6.  Stent, left superficial femoral artery   Left external iliac artery pre-treatment stenosis: 90% Left external iliac artery post treatment stenosis: 0% Treatment: Left external iliac artery: A 7 x 30 Cordis self-expanding stent  Left superficial femoral artery pretreatment stenosis: 90% Left superficial femoral artery post treatment stenosis: Less than 10% with non-flow limiting dissection Treatment: Left superficial femoral artery: Predilation with a 4 mm balloon followed by Lutonix 4x40 with non-flow-limiting dissection which was stented using a Cordis self-expanding 6 x 30 Patient was enrolled into the SAFE registry  Indications:  The patient has a history of a distal left superficial femoral artery to below-knee popliteal artery bypass graft for a lower extremity ulcer.  This was done in 2010.  2011 she was found to have anastomotic stenosis which was treated with angioplasty followed by dissection requiring stenting using a 6 mm stent.  She has done very well since that time and healed her ulcer.  She has been having trouble walking which could be due to her osteoarthritis of her hip.  Ultrasound could not visualize her bypass and also identified significant stenosis within her native superficial femoral artery.  She is here for further evaluation.     Procedure:  The patient was identified in the holding area and taken to room 8.  The patient was then placed supine on the table and prepped and draped in the usual sterile fashion.  A time out was  called.  Ultrasound was used to evaluate the right common femoral artery.  It was patent .  A digital ultrasound image was acquired.  A micropuncture needle was used to access the right common femoral artery under ultrasound guidance.  An 018 wire was advanced without resistance and a micropuncture sheath was placed.  The 018 wire was removed and a benson wire was placed.  The micropuncture sheath was exchanged for a 5 french sheath.  An omniflush catheter was advanced over the wire to the level of L-1.  An abdominal angiogram was obtained.  Next, using the omniflush catheter and a benson wire, the aortic bifurcation was crossed and the catheter was placed into theleft external iliac artery and left runoff was obtained.     Findings:   Aortogram:  No significant renal artery stenosis is identified.  The infrarenal abdominal aorta is patent.  There is aneurysmal degeneration at the distal aorta.  The right common and external iliac arteries are widely patent.  The left common iliac artery and stent are  widely patent.  There is a 90% stenosis within the left external iliac artery.  Right Lower Extremity:  Not evaluated  Left Lower Extremity:  The left common femoral and profunda femoral artery are widely patent.  The left superficial femoral artery has a 90% stenosis within its midportion.  The bypass graft from the superficial femoral to the below-knee popliteal artery is widely patent with two-vessel runoff via the anterior tibial and peroneal artery.  Intervention:  After the above images were acquired, the decision was made to proceed with intervention.  Over a 035 wire, a 7  French sheath was advanced over the aortic bifurcation into the left external iliac artery.  The patient was fully heparinized.  I elected to treat the left external iliac lesion first.  I elected to primarily stented this area.  I selected a 7 x 30 Cordis self-expanding stent and deployed it across the lesion and postdilated with a 6  mm balloon.  Pre-treatment stenosis was 90%, post was less than 10%.  Next, I was easily able to advance a wire across the superficial femoral artery stenosis.  My initial plan was to treat this with a drug coated balloon angioplasty.  I predilated the lesion with a 4 x 40 balloon which was taken to nominal pressure for 2 minutes.  Initial imaging revealed a nonflow limiting dissection.  I selected a  Lutonix 4x40 and took this balloon to nominal pressure and held it up for 3 minutes.  Completion Joetta Manners revealed complete resolution of the stenosis (less than 10%), however there was a nonflow limiting dissection.  Based on the appearance of the dissection, I felt that it needed to be stented.  A Cordis 6 x 30 stent was deployed across the dissection and molded to confirmation with a 5 mm balloon.  Completion imaging revealed resolution of the dissection and a widely patent left superficial femoral artery with no significant stenosis appreciated.  Runoff was unchanged.  Catheters and wires were removed and the sheath was withdrawn to the right external iliac artery.  The patient was taken the holding area for a sheath pull once her coagulation profile corrects.   Impression:  #1  successful stenting of the left external iliac stenosis using a 7 x 30 Cordis self-expanding stent.  Pretreatment stenosis was 90%, post was 0%  #2  successful stenting of the left superficial femoral artery using a 6 x 30 Cordis self-expanding stent.  This lesion was initially treated with drug coated balloon angioplasty using a Lutonix 4 x 40 balloon with a nonflow limiting dissection following treatment  #3  left superficial femoral artery to below-knee popliteal artery bypass graft is widely patent with two-vessel runoff via the peroneal and anterior tibial artery   V. [name], M.D. [facility] [place] Office: [phone] Pager:  [phone]

## 2014-12-26 NOTE — Interval H&P Note (Signed)
History and Physical Interval Note:  12/26/2014 6:25 PM  [name]  has presented today for surgery, with the diagnosis of left limp ascemia  The various methods of treatment have been discussed with the patient and family. After consideration of risks, benefits and other options for treatment, the patient has consented to  Procedure(s): ABDOMINAL AORTAGRAM (N/A) INSERTION OF ILIAC STENT (Left) as a surgical intervention .  The patient's history has been reviewed, patient examined, no change in status, stable for surgery.  I have reviewed the patient's chart and labs.  Questions were answered to the patient's satisfaction.     Lendell Gallick IV, V. [facility]

## 2014-12-26 NOTE — Discharge Instructions (Signed)

## 2014-12-26 NOTE — Progress Notes (Signed)
Site area: rt groin Site Prior to Removal:  Level 0 Pressure Applied For: 25 minutes Manual:   yes Patient Status During Pull:  stable Post Pull Site:  Level  0 Post Pull Instructions Given:  yes Post Pull Pulses Present: yes Dressing Applied:  tegaderm Bedrest begins @  7225 Comments: small dime sized bruise at stick site. No hematoma

## 2014-12-29 ENCOUNTER — Telehealth: Payer: Self-pay | Admitting: Surgery

## 2014-12-29 NOTE — Telephone Encounter (Addendum)
-----   Message from Mena Goes, RN sent at 12/26/2014 12:59 PM EST ----- Regarding: Schedule   ----- Message -----    From: Serafina Mitchell, MD    Sent: 12/26/2014  12:50 PM      To: [facility] Charge Pool  12/26/2014:   Surgeon:  Eldridge Abrahams Procedure Performed:  1.  Ultrasound-guided access, right femoral artery  2.  Abdominal aortogram  3.  Left lower extremity runoff  4.  Stent, left external iliac artery  5.  Drug coated balloon angioplasty, left superficial femoral artery  6.  Stent, left superficial femoral artery   Schedule patient for follow-up in 3 months with a duplex and ABIs of her left leg  12/29/14: lm for pt and mailed letter, dpm

## 2015-03-30 ENCOUNTER — Encounter: Payer: Self-pay | Admitting: Surgery

## 2015-04-02 ENCOUNTER — Ambulatory Visit (HOSPITAL_COMMUNITY)
Admission: RE | Admit: 2015-04-02 | Discharge: 2015-04-02 | Disposition: A | Discharge status: Home / Self Care | Payer: Medicare Other | Source: Ambulatory Visit | Attending: Surgery | Admitting: Surgery

## 2015-04-02 ENCOUNTER — Encounter: Payer: Self-pay | Admitting: Surgery

## 2015-04-02 ENCOUNTER — Ambulatory Visit (INDEPENDENT_AMBULATORY_CARE_PROVIDER_SITE_OTHER)
Admission: RE | Admit: 2015-04-02 | Discharge: 2015-04-02 | Disposition: A | Discharge status: Home / Self Care | Payer: Medicare Other | Source: Ambulatory Visit | Attending: Surgery | Admitting: Surgery

## 2015-04-02 ENCOUNTER — Ambulatory Visit (INDEPENDENT_AMBULATORY_CARE_PROVIDER_SITE_OTHER): Payer: Medicare Other | Admitting: Surgery

## 2015-04-02 ENCOUNTER — Other Ambulatory Visit: Payer: Self-pay | Admitting: Surgery

## 2015-04-02 VITALS — BP 163/79 | HR 73 | Resp 16 | Ht 62.0 in | Wt 138.0 lb

## 2015-04-02 DIAGNOSIS — Z9889 Other specified postprocedural states: Secondary | ICD-10-CM

## 2015-04-02 DIAGNOSIS — I70202 Unspecified atherosclerosis of native arteries of extremities, left leg: Secondary | ICD-10-CM | POA: Insufficient documentation

## 2015-04-02 DIAGNOSIS — Z48812 Encounter for surgical aftercare following surgery on the circulatory system: Secondary | ICD-10-CM | POA: Diagnosis not present

## 2015-04-02 DIAGNOSIS — R1030 Lower abdominal pain, unspecified: Secondary | ICD-10-CM | POA: Insufficient documentation

## 2015-04-02 DIAGNOSIS — R1032 Left lower quadrant pain: Secondary | ICD-10-CM

## 2015-04-02 DIAGNOSIS — I739 Peripheral vascular disease, unspecified: Secondary | ICD-10-CM

## 2015-04-02 DIAGNOSIS — Z9862 Peripheral vascular angioplasty status: Secondary | ICD-10-CM

## 2015-04-02 DIAGNOSIS — Z95820 Peripheral vascular angioplasty status with implants and grafts: Secondary | ICD-10-CM | POA: Diagnosis not present

## 2015-04-02 NOTE — Progress Notes (Signed)
Filed Vitals:   04/02/15 1200 04/02/15 1208  BP: 158/79 163/79  Pulse: 73   Resp: 16   Height: 5\' 2"  (1.575 m)   Weight: 138 lb (62.596 kg)   SpO2: 100%

## 2015-04-02 NOTE — Addendum Note (Signed)
Addended by: Mena Goes on: 04/02/2015 05:05 PM   Modules accepted: Orders

## 2015-04-02 NOTE — Progress Notes (Signed)
Patient name: [name] MRN: [id number] DOB: 08/15/1928 Sex: female     Chief Complaint  Patient presents with  . Re-evaluation    3 mo  PVD f/u w/ Vasc. Lab ABI's and duplex.    S/P Iliac stent, left 12-26-14  C/O  Left groin area discomfort, off/on 3 mo.     HISTORY OF PRESENT ILLNESS: The patient is back today for followup. She initially presented in October 2010 with ischemic changes to her left foot. She underwent distal left superficial femoral to below knee popliteal artery bypass graft with reversed ipsilateral greater saphenous vein she did have issues with wound healing which ultimately resolved. She developed a high-grade stenosis within the popliteal artery above the proximal anastomosis which was stented in 2011. She was also found to have an elevated velocity and on 12/11/2010 and in-stent stenosis was re\re stented using a 6 x 30 EV3 stents.   She recently had an ultrasound that showed native superficial femoral artery stenosis proximal to her stents.  Therefore on 01/05/2015 she underwent angiography.  At that time, she had stenting of her left external iliac stenosis with a 7 x 30 self-expanding stent as well as stenting of her left superficial femoral artery using a 6 x 30 self-expanding stent.  The lesion in the superficial femoral artery was initially treated with a drug coated balloon angioplasty which was complicated by non-flow limiting dissection requiring stenting the bypass graft was found to be widely patent with two-vessel runoff via the peroneal and anterior tibial artery.  The patient reports no complaints today.  She occasionally has her legs give out which has been attributed to her hip which she is discussing with Dr. Alvan Dame  Regarding replacement.  Past Medical History  Diagnosis Date  . Hypertension   . Stroke     History of TIA  . History of angina   . Peripheral vascular disease   . Fall Oct. 15, 2014    Fx. pelvis, Left Hip, Left Elbow  . DVT  (deep venous thrombosis)   . Cancer Aug. 2015    [facility] - Right Lower Leg    Past Surgical History  Procedure Laterality Date  . Abdominal hysterectomy    . Pr vein bypass graft,aorto-fem-pop  09-13-09    Left Fem-pop  . Femoral artery stent  12-11-10    Left SFA  . Total elbow arthroplasty Left 09/03/2013    Procedure: LEFT TOTAL ELBOW ARTHROPLASTY;  Surgeon: Roseanne Kaufman, MD;  Location: [facility];  Service: Orthopedics;  Laterality: Left;  . Joint replacement      knee  . Joint replacement Left Oct. 17, 2014    Elbow ( pt fell 08-31-13 )  . Abdominal aortagram N/A 12/26/2014    Procedure: ABDOMINAL Maxcine Ham;  Surgeon: Serafina Mitchell, MD;  Location: [facility] CATH LAB;  Service: Cardiovascular;  Laterality: N/A;  . Insertion of iliac stent Left 12/26/2014    Procedure: INSERTION OF ILIAC STENT;  Surgeon: Serafina Mitchell, MD;  Location: [facility] CATH LAB;  Service: Cardiovascular;  Laterality: Left;    History   Social History  . Marital Status: Married    Spouse Name: N/A  . Number of Children: N/A  . Years of Education: N/A   Occupational History  . Not on file.   Social History Main Topics  . Smoking status: Former Smoker    Types: Cigarettes    Quit date: 11/17/1946  . Smokeless tobacco: Never Used  . Alcohol Use:  No  . Drug Use: No  . Sexual Activity: Not on file   Other Topics Concern  . Not on file   Social History Narrative    Family History  Problem Relation Age of Onset  . Heart disease Father     Heart Disease before age 60  . Hyperlipidemia Father   . Hypertension Father   . Alcohol abuse Father   . Hyperlipidemia Sister   . Hypertension Sister   . Heart disease Brother   . Hyperlipidemia Brother   . Hypertension Brother   . Deep vein thrombosis Brother   . Heart disease Son     Heart Disease before age 43  . Hyperlipidemia Son   . Hypertension Son   . Heart attack Son   . Diabetes Son   . Hypertension Son     Allergies as of 04/02/2015 - Review  Complete 04/02/2015  Allergen Reaction Noted  . Promethazine hcl Other (See Comments) 01/15/2012  . Sulfa antibiotics Other (See Comments) 01/15/2012    Current Outpatient Prescriptions on File Prior to Visit  Medication Sig Dispense Refill  . acetaminophen (TYLENOL) 500 MG tablet Take 1,000 mg by mouth every 6 (six) hours as needed for pain. Patient took this medication for her pain.    Marland Kitchen amitriptyline (ELAVIL) 25 MG tablet Take 25 mg by mouth at bedtime.     Marland Kitchen aspirin 81 MG tablet Take 81 mg by mouth daily.    . bisacodyl (DULCOLAX) 10 MG suppository Place 1 suppository (10 mg total) rectally daily as needed. (Patient not taking: Reported on 12/19/2014) 12 suppository 0  . Cholecalciferol (VITAMIN D3) 2000 UNITS TABS Take 2,000 Units by mouth daily.    . clopidogrel (PLAVIX) 75 MG tablet Take 75 mg by mouth daily.    Marland Kitchen HYDROcodone-acetaminophen (NORCO/VICODIN) 5-325 MG per tablet Take 1-2 tablets by mouth every 4 (four) hours as needed. (Patient not taking: Reported on 12/19/2014) 80 tablet 0  . metoprolol (LOPRESSOR) 50 MG tablet Take 25-50 mg by mouth 2 (two) times daily. Take 50mg  by mouth in the morning and take 25mg  by mouth at bedtime.    . Multiple Vitamin (MULTIVITAMIN) tablet Take 1 tablet by mouth daily.    . Multiple Vitamins-Minerals (EYE VITAMINS PO) Take 1 tablet by mouth daily.     . polyethylene glycol (MIRALAX / GLYCOLAX) packet Take 17 g by mouth daily as needed. (Patient not taking: Reported on 12/19/2014) 14 each 0  . valsartan-hydrochlorothiazide (DIOVAN-HCT) 160-12.5 MG per tablet Take 0.5 tablets by mouth daily.     No current facility-administered medications on file prior to visit.     REVIEW OF SYSTEMS: Cardiovascular: No chest pain, chest pressure, palpitations, orthopnea, or dyspnea on exertion. No claudication or rest pain,  No history of DVT or phlebitis. Pulmonary: No productive cough, asthma or wheezing. Neurologic: No weakness, paresthesias, aphasia, or  amaurosis. No dizziness. Hematologic: No bleeding problems or clotting disorders. Musculoskeletal: left hip pain. Gastrointestinal: No blood in stool or hematemesis Genitourinary: No dysuria or hematuria. Psychiatric:: No history of major depression. Integumentary: No rashes or ulcers. Constitutional: No fever or chills.  PHYSICAL EXAMINATION:   Vital signs are  Filed Vitals:   04/02/15 1200 04/02/15 1208  BP: 158/79 163/79  Pulse: 73   Resp: 16   Height: 5\' 2"  (1.575 m)   Weight: 138 lb (62.596 kg)   SpO2: 100%    Body mass index is 25.23 kg/(m^2). General: The patient appears their stated age. HEENT:  No gross abnormalities Pulmonary:  Non labored breathing Abdomen: Soft and non-tender.  Tender left lower quadrant area does not show any evidence of hernia Musculoskeletal: There are no major deformities. Neurologic: No focal weakness or paresthesias are detected, Skin: There are no ulcer or rashes noted. Psychiatric: The patient has normal affect. Cardiovascular: There is a regular rate and rhythm without significant murmur appreciated.palpable left dorsalis pedis pulse   Diagnostic Studies Ultrasound is been reviewed.  She has an ABI of 0.65 on the right and 1.01 on the left.  Mild hyperplasia within the superficial femoral artery stent is noted.  The bypass graft is widely patent.  Assessment: Atherosclerosis with left leg bypass and stenting Plan: She is asymptomatic currently.  Her bypass graft and stents remain widely patent.  She will follow up in 6 months with surveillance ultrasound imaging  V. Leia Alf, M.D. [facility] [place] Office: [phone] Pager:  [phone]

## 2015-06-06 DIAGNOSIS — Z23 Encounter for immunization: Secondary | ICD-10-CM | POA: Diagnosis not present

## 2015-06-06 DIAGNOSIS — D81818 Other biotin-dependent carboxylase deficiency: Secondary | ICD-10-CM | POA: Diagnosis not present

## 2015-06-06 DIAGNOSIS — Z79899 Other long term (current) drug therapy: Secondary | ICD-10-CM | POA: Diagnosis not present

## 2015-06-06 DIAGNOSIS — I739 Peripheral vascular disease, unspecified: Secondary | ICD-10-CM | POA: Diagnosis not present

## 2015-06-06 DIAGNOSIS — E780 Pure hypercholesterolemia: Secondary | ICD-10-CM | POA: Diagnosis not present

## 2015-06-06 DIAGNOSIS — M1990 Unspecified osteoarthritis, unspecified site: Secondary | ICD-10-CM | POA: Diagnosis not present

## 2015-06-06 DIAGNOSIS — I10 Essential (primary) hypertension: Secondary | ICD-10-CM | POA: Diagnosis not present

## 2015-06-06 DIAGNOSIS — E559 Vitamin D deficiency, unspecified: Secondary | ICD-10-CM | POA: Diagnosis not present

## 2015-06-06 DIAGNOSIS — R5382 Chronic fatigue, unspecified: Secondary | ICD-10-CM | POA: Diagnosis not present

## 2015-06-06 DIAGNOSIS — B0229 Other postherpetic nervous system involvement: Secondary | ICD-10-CM | POA: Diagnosis not present

## 2015-06-06 DIAGNOSIS — Z1389 Encounter for screening for other disorder: Secondary | ICD-10-CM | POA: Diagnosis not present

## 2015-06-06 DIAGNOSIS — K219 Gastro-esophageal reflux disease without esophagitis: Secondary | ICD-10-CM | POA: Diagnosis not present

## 2015-06-06 DIAGNOSIS — Z0001 Encounter for general adult medical examination with abnormal findings: Secondary | ICD-10-CM | POA: Diagnosis not present

## 2015-06-06 DIAGNOSIS — M810 Age-related osteoporosis without current pathological fracture: Secondary | ICD-10-CM | POA: Diagnosis not present

## 2015-06-06 DIAGNOSIS — G4700 Insomnia, unspecified: Secondary | ICD-10-CM | POA: Diagnosis not present

## 2015-06-14 DIAGNOSIS — Z0100 Encounter for examination of eyes and vision without abnormal findings: Secondary | ICD-10-CM | POA: Diagnosis not present

## 2015-06-14 DIAGNOSIS — H26491 Other secondary cataract, right eye: Secondary | ICD-10-CM | POA: Diagnosis not present

## 2015-06-14 DIAGNOSIS — H04123 Dry eye syndrome of bilateral lacrimal glands: Secondary | ICD-10-CM | POA: Diagnosis not present

## 2015-06-14 DIAGNOSIS — H3531 Nonexudative age-related macular degeneration: Secondary | ICD-10-CM | POA: Diagnosis not present

## 2015-06-26 ENCOUNTER — Telehealth: Payer: Self-pay | Admitting: *Deleted

## 2015-06-26 NOTE — Telephone Encounter (Signed)
Called [name] for her 6 month SAFE-DC[facility] follow-up phone call. She states she has been doing well and has had no additional interventions or hospitalizations.

## 2015-06-27 DIAGNOSIS — Z96622 Presence of left artificial elbow joint: Secondary | ICD-10-CM | POA: Diagnosis not present

## 2015-06-27 DIAGNOSIS — M7542 Impingement syndrome of left shoulder: Secondary | ICD-10-CM | POA: Diagnosis not present

## 2015-06-27 DIAGNOSIS — Z471 Aftercare following joint replacement surgery: Secondary | ICD-10-CM | POA: Diagnosis not present

## 2015-07-19 DIAGNOSIS — H26491 Other secondary cataract, right eye: Secondary | ICD-10-CM | POA: Diagnosis not present

## 2015-07-19 DIAGNOSIS — H35341 Macular cyst, hole, or pseudohole, right eye: Secondary | ICD-10-CM | POA: Diagnosis not present

## 2015-07-19 DIAGNOSIS — H2640 Unspecified secondary cataract: Secondary | ICD-10-CM | POA: Diagnosis not present

## 2015-09-13 DIAGNOSIS — Z23 Encounter for immunization: Secondary | ICD-10-CM | POA: Diagnosis not present

## 2015-10-17 ENCOUNTER — Encounter: Payer: Self-pay | Admitting: Family

## 2015-10-19 DIAGNOSIS — B001 Herpesviral vesicular dermatitis: Secondary | ICD-10-CM | POA: Diagnosis not present

## 2015-10-19 DIAGNOSIS — R05 Cough: Secondary | ICD-10-CM | POA: Diagnosis not present

## 2015-10-19 DIAGNOSIS — J209 Acute bronchitis, unspecified: Secondary | ICD-10-CM | POA: Diagnosis not present

## 2015-10-22 ENCOUNTER — Ambulatory Visit (INDEPENDENT_AMBULATORY_CARE_PROVIDER_SITE_OTHER): Payer: Medicare Other | Admitting: Family

## 2015-10-22 ENCOUNTER — Ambulatory Visit (HOSPITAL_COMMUNITY)
Admission: RE | Admit: 2015-10-22 | Discharge: 2015-10-22 | Disposition: A | Discharge status: Home / Self Care | Payer: Medicare Other | Source: Ambulatory Visit | Attending: Family | Admitting: Family

## 2015-10-22 ENCOUNTER — Encounter: Payer: Self-pay | Admitting: Family

## 2015-10-22 ENCOUNTER — Ambulatory Visit (INDEPENDENT_AMBULATORY_CARE_PROVIDER_SITE_OTHER)
Admission: RE | Admit: 2015-10-22 | Discharge: 2015-10-22 | Disposition: A | Discharge status: Home / Self Care | Payer: Medicare Other | Source: Ambulatory Visit | Attending: Family | Admitting: Family

## 2015-10-22 ENCOUNTER — Other Ambulatory Visit: Payer: Self-pay

## 2015-10-22 VITALS — BP 156/93 | HR 68 | Temp 97.7°F | Ht 62.0 in | Wt 129.3 lb

## 2015-10-22 DIAGNOSIS — I739 Peripheral vascular disease, unspecified: Secondary | ICD-10-CM

## 2015-10-22 DIAGNOSIS — I10 Essential (primary) hypertension: Secondary | ICD-10-CM | POA: Diagnosis not present

## 2015-10-22 DIAGNOSIS — R938 Abnormal findings on diagnostic imaging of other specified body structures: Secondary | ICD-10-CM | POA: Insufficient documentation

## 2015-10-22 DIAGNOSIS — Z959 Presence of cardiac and vascular implant and graft, unspecified: Secondary | ICD-10-CM

## 2015-10-22 DIAGNOSIS — Z48812 Encounter for surgical aftercare following surgery on the circulatory system: Secondary | ICD-10-CM

## 2015-10-22 DIAGNOSIS — Z95828 Presence of other vascular implants and grafts: Secondary | ICD-10-CM | POA: Diagnosis not present

## 2015-10-22 DIAGNOSIS — I70203 Unspecified atherosclerosis of native arteries of extremities, bilateral legs: Secondary | ICD-10-CM | POA: Diagnosis not present

## 2015-10-22 NOTE — Progress Notes (Signed)
[facility] [place] HISTORY AND PHYSICAL -PAD  History of Present Illness [name] is a 81 y.o. female patient of [name] who is status post left SFA to popliteal bypass graft that was done in 2010, she also had left SFA stent in June 2011 and 2012. She is also s/p stenting of left EIA and left SFA on 12/26/14.   She returns today for LE arterial evaluation. As best can be determined from pt's description, she did not have claudication symptoms before she fell October 2014, since her fall she has not walked much. Pt denies non healing wounds. Pt reports a couple of TIA's about 2000. Pt was receiving home physical therapy twice weekly, and does exercises on her own that she was taught.  Pt reports New Medical or Surgical History: Around Thanksgiving 2016 she developed bronchitis, is under treatment by her PCP for this and feels improved.   fell October 2014 and fractured left pelvis, had left elbow replacement, fractured left ankle and wrist.  Her lower legs cramping at rest is relieved by mustard by mouth.   Pt Diabetic: No Pt smoker: non-smoker  Pt meds include: Statin :No, statins cause aching Betablocker: Yes ASA: Yes Other anticoagulants/antiplatelets: Plavix     Past Medical History  Diagnosis Date  . Hypertension   . Stroke [facility])     History of TIA  . History of angina   . Peripheral vascular disease ([place])   . Fall Oct. 15, 2014    Fx. pelvis, Left Hip, Left Elbow  . DVT (deep venous thrombosis) ([place])   . Cancer [facility]) Aug. 2015    SCC - Right Lower Leg    Social History Social History  Substance Use Topics  . Smoking status: Former Smoker    Types: Cigarettes    Quit date: 11/17/1946  . Smokeless tobacco: Never Used  . Alcohol Use: No    Family History Family History  Problem Relation Age of Onset  . Heart disease Father     Heart Disease before age 83  . Hyperlipidemia Father   . Hypertension Father   . Alcohol abuse  Father   . Hyperlipidemia Sister   . Hypertension Sister   . Heart disease Brother   . Hyperlipidemia Brother   . Hypertension Brother   . Deep vein thrombosis Brother   . Heart disease Son     Heart Disease before age 96  . Hyperlipidemia Son   . Hypertension Son   . Heart attack Son   . Diabetes Son   . Hypertension Son     Past Surgical History  Procedure Laterality Date  . Abdominal hysterectomy    . Pr vein bypass graft,aorto-fem-pop  09-13-09    Left Fem-pop  . Femoral artery stent  12-11-10    Left SFA  . Total elbow arthroplasty Left 09/03/2013    Procedure: LEFT TOTAL ELBOW ARTHROPLASTY;  Surgeon: Roseanne Kaufman, MD;  Location: [facility];  Service: Orthopedics;  Laterality: Left;  . Joint replacement      knee  . Joint replacement Left Oct. 17, 2014    Elbow ( pt fell 08-31-13 )  . Abdominal aortagram N/A 12/26/2014    Procedure: ABDOMINAL Maxcine Ham;  Surgeon: Serafina Mitchell, MD;  Location: [facility] C[facility] LAB;  Service: Cardiovascular;  Laterality: N/A;  . Insertion of iliac stent Left 12/26/2014    Procedure: INSERTION OF ILIAC STENT;  Surgeon: Serafina Mitchell, MD;  Location: [facility] CATH LAB;  Service:  Cardiovascular;  Laterality: Left;    Allergies  Allergen Reactions  . Promethazine Hcl Other (See Comments)    IV  Drug only, unknown   . Sulfa Antibiotics Other (See Comments)    Unknown     Current Outpatient Prescriptions  Medication Sig Dispense Refill  . acetaminophen (TYLENOL) 500 MG tablet Take 1,000 mg by mouth every 6 (six) hours as needed for pain. Patient took this medication for her pain.    Marland Kitchen aspirin 81 MG tablet Take 81 mg by mouth daily.    . bisacodyl (DULCOLAX) 10 MG suppository Place 1 suppository (10 mg total) rectally daily as needed. 12 suppository 0  . Cholecalciferol (VITAMIN D3) 2000 UNITS TABS Take 2,000 Units by mouth daily.    . clopidogrel (PLAVIX) 75 MG tablet Take 75 mg by mouth daily.    Marland Kitchen HYDROcodone-acetaminophen (NORCO/VICODIN) 5-325 MG per  tablet Take 1-2 tablets by mouth every 4 (four) hours as needed. 80 tablet 0  . metoprolol (LOPRESSOR) 50 MG tablet Take 25-50 mg by mouth 2 (two) times daily. Take 50mg  by mouth in the morning and take 25mg  by mouth at bedtime.    . Multiple Vitamin (MULTIVITAMIN) tablet Take 1 tablet by mouth daily.    . Multiple Vitamins-Minerals (EYE VITAMINS PO) Take 1 tablet by mouth daily.     . polyethylene glycol (MIRALAX / GLYCOLAX) packet Take 17 g by mouth daily as needed. 14 each 0  . valsartan-hydrochlorothiazide (DIOVAN-HCT) 160-12.5 MG per tablet Take 0.5 tablets by mouth daily.    Marland Kitchen amitriptyline (ELAVIL) 25 MG tablet Take 25 mg by mouth at bedtime.      No current facility-administered medications for this visit.    ROS: See HPI for pertinent positives and negatives.   Physical Examination  Filed Vitals:   10/22/15 1425 10/22/15 1426  BP: 150/80 156/93  Pulse: 68   Temp: 97.7 F (36.5 C)   TempSrc: Oral   Height: 5\' 2"  (1.575 m)   Weight: 129 lb 4.8 oz (58.65 kg)   SpO2: 100%    Body mass index is 23.64 kg/(m^2).  General: A&O x 3, WDWN  Gait: using quad cane, slow and deliberate Eyes: Pupils equal Pulmonary: CTAB, no wheezes ,rhonchi, or rales. Occasional cough, blowing her nose. Cardiac: regular rhythm, no detected murmur     Carotid Bruits Left Right   negative Negative  Aorta: is not palpable Radial pulses: are 2+ palpable and =   VASCULAR EXAM: Extremities with ischemic changes: mottled cyanosis on sole of left foot,  without Gangrene; without open wounds.     LE Pulses LEFT RIGHT   FEMORAL 1+ palpable  1+ palpable    POPLITEAL not palpable  not palpable   POSTERIOR TIBIAL  not palpable   Not palpable    DORSALIS PEDIS   ANTERIOR TIBIAL not palpable  not palpable    Abdomen: soft, NT, no palpable masses. Skin: no rashes, no ulcers. Musculoskeletal: no muscle wasting or atrophy. Neurologic: A&O X 3; Appropriate Affect; MOTOR FUNCTION: moving all extremities equally, motor strength 3/5 throughout. Speech is fluent/normal. CN 2-12 intact.          Non-Invasive Vascular Imaging: DATE: 10/22/2015 LOWER EXTREMITY ARTERIAL EVALUATION    INDICATION: Peripheral vascular disease    PREVIOUS INTERVENTION(S): Left distal superficial femoral to popliteal artery bypass graft 09/13/2009; Left superficial femoral artery stents placed 04/25/2010 &12/11/2010; Left external iliac artery stent and left superficial femoral artery angioplasty 12/26/2014.    DUPLEX EXAM: Duplex evaluation  of the lower extremity arterial system to include the common femoral, superficial femoral, popliteal, and tibial arteries and bypass graft(s) and/or stent(s) if present.      FINDINGS:                                       RIGHT LOWER EXTREMITY:  Diffuse heterogeneous plaque throughout the right lower extremity arterial system, with areas of significant stenosis as noted below.    LEFT LOWER EXTREMITY:  Diffuse heterogeneous plaque present involving the native arterial system of the left lower extremity. Limited visualization of the left proximal to mid segment of the left distal superficial femoral artery to popliteal artery bypass graft.        See the attached diagram for velocities.  IMPRESSION:  Hemodynamically significant stenosis present of 50%-74% involving the right proximal superficial femoral artery. Hemodynamically significant stenosis present of 75%-99% involving the left mid superficial femoral artery. Patent left proximal and distal superficial femoral artery stents, no hemodynamically significant plaque present. When and where visualized left distal superficial femoral to popliteal artery bypass graft  appears patent. No color or spectral Doppler waveform could be obtained involving the left peroneal artery suggestive of vessel occlusion.    ANKLE/BRACHIAL INDEX - Right = 0.58                     Left =   0.76         (Please see complete report)               ABI (Date: 10/22/2015)  R: 0.58 (0.65, 04/02/15), DP: monophasic, PT: monophasic, TBI: 0.28  L: 0.76 (1.01), DP: monophasic, PT: monophasic, TBI: 0.61     ASSESSMENT: [name] is a 81 y.o. female who presents s/p left distal superficial femoral to popliteal artery bypass graft 09/13/2009, left superficial femoral artery stents placed 04/25/2010 &12/11/2010; left external iliac artery stent, and left superficial femoral artery angioplasty 12/26/2014. She has night legs cramps relieved by po mustard. It is difficult to ascertain whether she has claudication symptoms with walking, but it seems that she describes bilateral calf claudication with walking. She has not been walking much since Thanksgiving when she developed bronchitis and has back and hip pain with standing also.   Today's bilateral LE arterial duplex suggests hemodynamically significant stenosis present of 50%-74% involving the right proximal superficial femoral artery. Hemodynamically significant stenosis present of 75%-99% involving the left mid superficial femoral artery. Patent left proximal and distal superficial femoral artery stents, no hemodynamically significant plaque present. When and where visualized left distal superficial femoral to popliteal artery bypass graft appears patent. No color or spectral Doppler waveform could be obtained involving the left peroneal artery suggestive of vessel occlusion. Right ABI remains stable with moderate arterial occlusive disease, but the left ABI decreased from normal to moderate arterial occlusive disease since May of this year. The sole of her left foot is mottled and cool. See Plan. Face to face  time with patient was 25 minutes. Over 50% of this time was spent on counseling and coordination of care.   PLAN:  Based on the patient's vascular studies and examination, and after discussing with [name], pt will be scheduled for arteriogram with bilateral LE run off, possible intervention in left LE.   I discussed in depth with the patient the nature of atherosclerosis, and emphasized the importance of maximal  medical management including strict control of blood pressure, blood glucose, and lipid levels, obtaining regular exercise, and continued cessation of smoking.  The patient is aware that without maximal medical management the underlying atherosclerotic disease process will progress, limiting the benefit of any interventions.  The patient was given information about PAD including signs, symptoms, treatment, what symptoms should prompt the patient to seek immediate medical care, and risk reduction measures to take.  Clemon Chambers, RN, MSN, FNP-C [facility] Phone: [phone]  Clinic MD: Trula Slade  10/22/2015 2:31 PM

## 2015-10-22 NOTE — Patient Instructions (Signed)
Peripheral Vascular Disease Peripheral vascular disease (PVD) is a disease of the blood vessels that are not part of your heart and brain. A simple term for PVD is poor circulation. In most cases, PVD narrows the blood vessels that carry blood from your heart to the rest of your body. This can result in a decreased supply of blood to your arms, legs, and internal organs, like your stomach or kidneys. However, it most often affects a person's lower legs and feet. There are two types of PVD.  Organic PVD. This is the more common type. It is caused by damage to the structure of blood vessels.  Functional PVD. This is caused by conditions that make blood vessels contract and tighten (spasm). Without treatment, PVD tends to get worse over time. PVD can also lead to acute ischemic limb. This is when an arm or limb suddenly has trouble getting enough blood. This is a medical emergency. CAUSES Each type of PVD has many different causes. The most common cause of PVD is buildup of a fatty material (plaque) inside of your arteries (atherosclerosis). Small amounts of plaque can break off from the walls of the blood vessels and become lodged in a smaller artery. This blocks blood flow and can cause acute ischemic limb. Other common causes of PVD include:  Blood clots that form inside of blood vessels.  Injuries to blood vessels.  Diseases that cause inflammation of blood vessels or cause blood vessel spasms.  Health behaviors and health history that increase your risk of developing PVD. RISK FACTORS  You may have a greater risk of PVD if you:  Have a family history of PVD.  Have certain medical conditions, including:  High cholesterol.  Diabetes.  High blood pressure (hypertension).  Coronary heart disease.  Past problems with blood clots.  Past injury, such as burns or a broken bone. These may have damaged blood vessels in your limbs.  Buerger disease. This is caused by inflamed blood  vessels in your hands and feet.  Some forms of arthritis.  Rare birth defects that affect the arteries in your legs.  Use tobacco.  Do not get enough exercise.  Are obese.  Are age 50 or older. SIGNS AND SYMPTOMS  PVD may cause many different symptoms. Your symptoms depend on what part of your body is not getting enough blood. Some common signs and symptoms include:  Cramps in your lower legs. This may be a symptom of poor leg circulation (claudication).  Pain and weakness in your legs while you are physically active that goes away when you rest (intermittent claudication).  Leg pain when at rest.  Leg numbness, tingling, or weakness.  Coldness in a leg or foot, especially when compared with the other leg.  Skin or hair changes. These can include:  Hair loss.  Shiny skin.  Pale or bluish skin.  Thick toenails.  Inability to get or maintain an erection (erectile dysfunction). People with PVD are more prone to developing ulcers and sores on their toes, feet, or legs. These may take longer than normal to heal. DIAGNOSIS Your health care provider may diagnose PVD from your signs and symptoms. The health care provider will also do a physical exam. You may have tests to find out what is causing your PVD and determine its severity. Tests may include:  Blood pressure recordings from your arms and legs and measurements of the strength of your pulses (pulse volume recordings).  Imaging studies using sound waves to take pictures of   the blood flow through your blood vessels (Doppler ultrasound).  Injecting a dye into your blood vessels before having imaging studies using:  X-rays (angiogram or arteriogram).  Computer-generated X-rays (CT angiogram).  A powerful electromagnetic field and a computer (magnetic resonance angiogram or MRA). TREATMENT Treatment for PVD depends on the cause of your condition and the severity of your symptoms. It also depends on your age. Underlying  causes need to be treated and controlled. These include long-lasting (chronic) conditions, such as diabetes, high cholesterol, and high blood pressure. You may need to first try making lifestyle changes and taking medicines. Surgery may be needed if these do not work. Lifestyle changes may include:  Quitting smoking.  Exercising regularly.  Following a low-fat, low-cholesterol diet. Medicines may include:  Blood thinners to prevent blood clots.  Medicines to improve blood flow.  Medicines to improve your blood cholesterol levels. Surgical procedures may include:  A procedure that uses an inflated balloon to open a blocked artery and improve blood flow (angioplasty).  A procedure to put in a tube (stent) to keep a blocked artery open (stent implant).  Surgery to reroute blood flow around a blocked artery (peripheral bypass surgery).  Surgery to remove dead tissue from an infected wound on the affected limb.  Amputation. This is surgical removal of the affected limb. This may be necessary in cases of acute ischemic limb that are not improved through medical or surgical treatments. HOME CARE INSTRUCTIONS  Take medicines only as directed by your health care provider.  Do not use any tobacco products, including cigarettes, chewing tobacco, or electronic cigarettes. If you need help quitting, ask your health care provider.  Lose weight if you are overweight, and maintain a healthy weight as directed by your health care provider.  Eat a diet that is low in fat and cholesterol. If you need help, ask your health care provider.  Exercise regularly. Ask your health care provider to suggest some good activities for you.  Use compression stockings or other mechanical devices as directed by your health care provider.  Take good care of your feet.  Wear comfortable shoes that fit well.  Check your feet often for any cuts or sores. SEEK MEDICAL CARE IF:  You have cramps in your legs  while walking.  You have leg pain when you are at rest.  You have coldness in a leg or foot.  Your skin changes.  You have erectile dysfunction.  You have cuts or sores on your feet that are not healing. SEEK IMMEDIATE MEDICAL CARE IF:  Your arm or leg turns cold and blue.  Your arms or legs become red, warm, swollen, painful, or numb.  You have chest pain or trouble breathing.  You suddenly have weakness in your face, arm, or leg.  You become very confused or lose the ability to speak.  You suddenly have a very bad headache or lose your vision.   This information is not intended to replace advice given to you by your health care provider. Make sure you discuss any questions you have with your health care provider.   Document Released: 12/11/2004 Document Revised: 11/24/2014 Document Reviewed: 04/13/2014 Elsevier Interactive Patient Education 2016 [employer]  

## 2015-10-30 ENCOUNTER — Ambulatory Visit (HOSPITAL_COMMUNITY)
Admission: RE | Admit: 2015-10-30 | Discharge: 2015-10-30 | Disposition: A | Discharge status: Home / Self Care | Payer: Medicare Other | Source: Ambulatory Visit | Attending: Surgery | Admitting: Surgery

## 2015-10-30 ENCOUNTER — Other Ambulatory Visit: Payer: Self-pay

## 2015-10-30 ENCOUNTER — Telehealth: Payer: Self-pay | Admitting: Surgery

## 2015-10-30 ENCOUNTER — Encounter (HOSPITAL_COMMUNITY)
Admission: RE | Disposition: A | Discharge status: Home / Self Care | Payer: Self-pay | Source: Ambulatory Visit | Attending: Surgery

## 2015-10-30 DIAGNOSIS — Z8673 Personal history of transient ischemic attack (TIA), and cerebral infarction without residual deficits: Secondary | ICD-10-CM | POA: Insufficient documentation

## 2015-10-30 DIAGNOSIS — Z87891 Personal history of nicotine dependence: Secondary | ICD-10-CM | POA: Insufficient documentation

## 2015-10-30 DIAGNOSIS — Y812 Prosthetic and other implants, materials and accessory general- and plastic-surgery devices associated with adverse incidents: Secondary | ICD-10-CM | POA: Insufficient documentation

## 2015-10-30 DIAGNOSIS — Z7902 Long term (current) use of antithrombotics/antiplatelets: Secondary | ICD-10-CM | POA: Insufficient documentation

## 2015-10-30 DIAGNOSIS — Z48812 Encounter for surgical aftercare following surgery on the circulatory system: Secondary | ICD-10-CM

## 2015-10-30 DIAGNOSIS — Z7982 Long term (current) use of aspirin: Secondary | ICD-10-CM | POA: Insufficient documentation

## 2015-10-30 DIAGNOSIS — Z95820 Peripheral vascular angioplasty status with implants and grafts: Secondary | ICD-10-CM

## 2015-10-30 DIAGNOSIS — T82856A Stenosis of peripheral vascular stent, initial encounter: Secondary | ICD-10-CM | POA: Diagnosis not present

## 2015-10-30 DIAGNOSIS — Z8249 Family history of ischemic heart disease and other diseases of the circulatory system: Secondary | ICD-10-CM | POA: Insufficient documentation

## 2015-10-30 DIAGNOSIS — I70312 Atherosclerosis of unspecified type of bypass graft(s) of the extremities with intermittent claudication, left leg: Secondary | ICD-10-CM | POA: Diagnosis not present

## 2015-10-30 DIAGNOSIS — Z86718 Personal history of other venous thrombosis and embolism: Secondary | ICD-10-CM | POA: Insufficient documentation

## 2015-10-30 DIAGNOSIS — I10 Essential (primary) hypertension: Secondary | ICD-10-CM | POA: Insufficient documentation

## 2015-10-30 DIAGNOSIS — I70302 Unspecified atherosclerosis of unspecified type of bypass graft(s) of the extremities, left leg: Secondary | ICD-10-CM | POA: Diagnosis not present

## 2015-10-30 DIAGNOSIS — I739 Peripheral vascular disease, unspecified: Secondary | ICD-10-CM

## 2015-10-30 HISTORY — PX: PERIPHERAL VASCULAR CATHETERIZATION: SHX172C

## 2015-10-30 LAB — POCT ACTIVATED CLOTTING TIME
ACTIVATED CLOTTING TIME: 188 s
ACTIVATED CLOTTING TIME: 172 s
Activated Clotting Time: 245 seconds
Activated Clotting Time: 198 seconds

## 2015-10-30 LAB — POCT I-STAT, CHEM 8
BUN: 18 mg/dL (ref 6–20)
CHLORIDE: 104 mmol/L (ref 101–111)
Calcium, Ion: 1.2 mmol/L (ref 1.13–1.30)
Creatinine, Ser: 1 mg/dL (ref 0.44–1.00)
Glucose, Bld: 99 mg/dL (ref 65–99)
HEMATOCRIT: 38 % (ref 36.0–46.0)
Hemoglobin: 12.9 g/dL (ref 12.0–15.0)
Potassium: 3.9 mmol/L (ref 3.5–5.1)
SODIUM: 140 mmol/L (ref 135–145)
TCO2: 25 mmol/L (ref 0–100)

## 2015-10-30 SURGERY — ABDOMINAL AORTOGRAM W/LOWER EXTREMITY
Anesthesia: LOCAL

## 2015-10-30 MED ORDER — HEPARIN (PORCINE) IN NACL 2-0.9 UNIT/ML-% IJ SOLN
INTRAMUSCULAR | Status: AC
  Filled 2015-10-30: qty 1000

## 2015-10-30 MED ORDER — LIDOCAINE HCL (PF) 1 % IJ SOLN
INTRAMUSCULAR | Status: AC
  Filled 2015-10-30: qty 30

## 2015-10-30 MED ORDER — HYDRALAZINE HCL 20 MG/ML IJ SOLN
INTRAMUSCULAR | Status: AC
  Filled 2015-10-30: qty 1

## 2015-10-30 MED ORDER — SODIUM CHLORIDE 0.9 % IV SOLN: 1.0000 mL/kg/h | INTRAVENOUS | Status: DC

## 2015-10-30 MED ORDER — METOPROLOL TARTRATE 1 MG/ML IV SOLN: 2.0000 mg | INTRAVENOUS | Status: DC | PRN

## 2015-10-30 MED ORDER — ATORVASTATIN CALCIUM 10 MG PO TABS: 10.0000 mg | ORAL_TABLET | Freq: Every day | ORAL | Status: DC

## 2015-10-30 MED ORDER — FENTANYL CITRATE (PF) 100 MCG/2ML IJ SOLN
INTRAMUSCULAR | Status: AC
  Filled 2015-10-30: qty 2

## 2015-10-30 MED ORDER — NITROGLYCERIN 1 MG/10 ML FOR IR/CATH LAB: INTRA_ARTERIAL | Status: DC | PRN

## 2015-10-30 MED ORDER — MIDAZOLAM HCL 2 MG/2ML IJ SOLN
INTRAMUSCULAR | Status: DC | PRN
  Administered 2015-10-30: 1 mg via INTRAVENOUS

## 2015-10-30 MED ORDER — HEPARIN SODIUM (PORCINE) 1000 UNIT/ML IJ SOLN
INTRAMUSCULAR | Status: DC | PRN
  Administered 2015-10-30: 6000 [IU] via INTRAVENOUS

## 2015-10-30 MED ORDER — ONDANSETRON HCL 4 MG/2ML IJ SOLN: 4.0000 mg | Freq: Four times a day (QID) | INTRAMUSCULAR | Status: DC | PRN

## 2015-10-30 MED ORDER — HEPARIN SODIUM (PORCINE) 1000 UNIT/ML IJ SOLN
INTRAMUSCULAR | Status: AC
  Filled 2015-10-30: qty 1

## 2015-10-30 MED ORDER — SODIUM CHLORIDE 0.9 % IV SOLN
INTRAVENOUS | Status: DC
  Administered 2015-10-30: 08:00:00 via INTRAVENOUS

## 2015-10-30 MED ORDER — HYDRALAZINE HCL 20 MG/ML IJ SOLN
5.0000 mg | INTRAMUSCULAR | Status: AC | PRN
  Administered 2015-10-30 (×2): 5 mg via INTRAVENOUS

## 2015-10-30 MED ORDER — FENTANYL CITRATE (PF) 100 MCG/2ML IJ SOLN
INTRAMUSCULAR | Status: DC | PRN
  Administered 2015-10-30: 25 ug via INTRAVENOUS

## 2015-10-30 MED ORDER — LIDOCAINE HCL (PF) 1 % IJ SOLN
INTRAMUSCULAR | Status: DC | PRN
  Administered 2015-10-30: 12 mL

## 2015-10-30 MED ORDER — MIDAZOLAM HCL 2 MG/2ML IJ SOLN
INTRAMUSCULAR | Status: AC
  Filled 2015-10-30: qty 2

## 2015-10-30 SURGICAL SUPPLY — 21 items
BALLN ARMADA 5X40X135 (BALLOONS) ×3
BALLN MUSTANG 5X60X135 (BALLOONS) ×3
BALLN MUSTANG 6.0X40 75 (BALLOONS) ×3
BALLOON ARMADA 5X40X135 (BALLOONS) IMPLANT
BALLOON MUSTANG 5X60X135 (BALLOONS) IMPLANT
BALLOON MUSTANG 6.0X40 75 (BALLOONS) IMPLANT
CATH OMNI FLUSH 5F 65CM (CATHETERS) ×1 IMPLANT
COVER PRB 48X5XTLSCP FOLD TPE (BAG) IMPLANT
COVER PROBE 5X48 (BAG) ×3
DRAPE ZERO GRAVITY STERILE (DRAPES) ×1 IMPLANT
KIT ENCORE 26 ADVANTAGE (KITS) ×1 IMPLANT
KIT MICROINTRODUCER STIFF 5F (SHEATH) ×1 IMPLANT
KIT PV (KITS) ×3 IMPLANT
SHEATH PINNACLE 5F 10CM (SHEATH) ×1 IMPLANT
SHEATH PINNACLE ST 6F 45CM (SHEATH) ×1 IMPLANT
STENT INNOVA 6X40X130 (Permanent Stent) ×1 IMPLANT
SYR MEDRAD MARK V 150ML (SYRINGE) ×3 IMPLANT
TRANSDUCER W/STOPCOCK (MISCELLANEOUS) ×3 IMPLANT
TRAY PV CATH (CUSTOM PROCEDURE TRAY) ×3 IMPLANT
WIRE BENTSON .035X145CM (WIRE) ×1 IMPLANT
WIRE HI TORQ VERSACORE J 260CM (WIRE) ×1 IMPLANT

## 2015-10-30 NOTE — Telephone Encounter (Signed)
Spoke with pts daughter, dpm °

## 2015-10-30 NOTE — Discharge Instructions (Signed)
Angiogram, Care After °Refer to this sheet in the next few weeks. These instructions provide you with information about caring for yourself after your procedure. Your health care provider may also give you more specific instructions. Your treatment has been planned according to current medical practices, but problems sometimes occur. Call your health care provider if you have any problems or questions after your procedure. °WHAT TO EXPECT AFTER THE PROCEDURE °After your procedure, it is typical to have the following: °· Bruising at the catheter insertion site that usually fades within 1-2 weeks. °· Blood collecting in the tissue (hematoma) that may be painful to the touch. It should usually decrease in size and tenderness within 1-2 weeks. °HOME CARE INSTRUCTIONS °· Take medicines only as directed by your health care provider. °· You may shower 24-48 hours after the procedure or as directed by your health care provider. Remove the bandage (dressing) and gently wash the site with plain soap and water. Pat the area dry with a clean towel. Do not rub the site, because this may cause bleeding. °· Do not take baths, swim, or use a hot tub until your health care provider approves. °· Check your insertion site every day for redness, swelling, or drainage. °· Do not apply powder or lotion to the site. °· Do not lift over 10 lb (4.5 kg) for 5 days after your procedure or as directed by your health care provider. °· Ask your health care provider when it is okay to: °¨ Return to work or school. °¨ Resume usual physical activities or sports. °¨ Resume sexual activity. °· Do not drive home if you are discharged the same day as the procedure. Have someone else drive you. °· You may drive 24 hours after the procedure unless otherwise instructed by your health care provider. °· Do not operate machinery or power tools for 24 hours after the procedure or as directed by your health care provider. °· If your procedure was done as an  outpatient procedure, which means that you went home the same day as your procedure, a responsible adult should be with you for the first 24 hours after you arrive home. °· Keep all follow-up visits as directed by your health care provider. This is important. °SEEK MEDICAL CARE IF: °· You have a fever. °· You have chills. °· You have increased bleeding from the catheter insertion site. Hold pressure on the site. °SEEK IMMEDIATE MEDICAL CARE IF: °· You have unusual pain at the catheter insertion site. °· You have redness, warmth, or swelling at the catheter insertion site. °· You have drainage (other than a small amount of blood on the dressing) from the catheter insertion site. °· The catheter insertion site is bleeding, and the bleeding does not stop after 30 minutes of holding steady pressure on the site. °· The area near or just beyond the catheter insertion site becomes pale, cool, tingly, or numb. °  °This information is not intended to replace advice given to you by your health care provider. Make sure you discuss any questions you have with your health care provider. °  °Document Released: 05/22/2005 Document Revised: 11/24/2014 Document Reviewed: 04/06/2013 °Elsevier Interactive Patient Education ©2016 [employer] Inc. ° °

## 2015-10-30 NOTE — Op Note (Addendum)
Patient name: [name] MRN: [id number] DOB: [birth year] Sex: female  10/30/2015 Pre-operative Diagnosis: Bypass graft stenosis Post-operative diagnosis:  Same Surgeon:  [name] Procedure Performed:  1.  Ultrasound-guided access, right femoral artery  2.  Abdominal aortogram  3.  Second order catheterization  4.  Bilateral lower extremity runoff  5.  Stent, left superficial femoral artery  6.  Angioplasty, in-stent stenosis, left popliteal artery     Indications:  Patient has had multiple interventions in the past including a bypass graft as well as stenting.  Ultrasound and a high-grade stenosis.  She is here today for further evaluation.  Procedure:  The patient was identified in the holding area and taken to room 8.  The patient was then placed supine on the table and prepped and draped in the usual sterile fashion.  A time out was called.  Ultrasound was used to evaluate the right common femoral artery.  It was patent .  A digital ultrasound image was acquired.  A micropuncture needle was used to access the right common femoral artery under ultrasound guidance.  An 018 wire was advanced without resistance and a micropuncture sheath was placed.  The 018 wire was removed and a benson wire was placed.  The micropuncture sheath was exchanged for a 5 french sheath.  An omniflush catheter was advanced over the wire to the level of L-1.  An abdominal angiogram was obtained.  Next, using the omniflush catheter and a benson wire, the aortic bifurcation was crossed and the catheter was placed into theleft external iliac artery and left runoff was obtained.  right runoff was performed via retrograde sheath injections.  Findings:   Aortogram:  No significant renal artery stenosis.  Focal aneurysmal changes to the infrarenal abdominal aorta, with relatively small diameter.  No significant aortic stenosis or iliac stenosis  Right Lower Extremity:  The right common femoral profunda  femoral artery are widely patent.  There are multiple high-grade, greater than 90% lesions within the superficial femoral artery and the popliteal artery.  Tibial vessels are not well visualized  Left Lower Extremity:  The left common femoral and profunda femoral artery are patent throughout their course.  A stent within the superficial femoral artery is widely patent.  Just beyond the stent there is a 90% stenosis which is very focal.  A above-knee to below-knee popliteal artery bypass graft is visualized and is widely patent.  There is no significant stenosis at the anastomosis however within the stent which goes into the proximal anastomosis there is luminal irregularity.  There is two-vessel runoff.  Intervention:  After the above interventions were acquired the decision was made to proceed with intervention.  Over a woolly wire, a 6 French sheath was inserted.  The patient's fully heparinized.  The wire was easily advanced across both lesions.  I elected to primarily stent the superficial femoral artery stenosis.  This was done using a [employer] ANOVA 6 x 40 self-expanding stent.  This was molded with a 5 mm balloon.  I also used the same balloon to perform balloon angioplasty in the previously deployed superficial femoral artery stent which had mild luminal narrowing.  I then had to change to a longer balloon to address the area in the proximal anastomosis.  I used an Abbott 5 x 40 balloon.  This was inflated but it moved 4 to needed to be repositioned.  I could not deflate the balloon and ultimately I was able to do this with  manual suction.  I elected to remove this balloon and selected a 5 x 60 Mustang balloon which was used to perform balloon angioplasty of the in-stent stenosis near the proximal anastomosis.  Completion imaging was then performed which showed resolution of the stenosis in both areas.  Runoff was unchanged.  Sheath was withdrawn to the right external iliac artery.  The patient  will be taken the holding area for sheath pull once her coagulation profile corrects  Impression:  #1  greater than 90% native superficial femoral artery stenosis on the left which was successfully treated using a [employer] ANOVA 6 x 40 self-expanding stent with residual stenosis less than 5%.  #2  balloon angioplasty of in-stent stenosis within the proximal above-knee popliteal anastomosis.  Preintervention stenosis was greater than 60%, post was less than 10%  #3  two-vessel runoff on the left leg  #4  multiple high-grade near occlusive lesions within the right superficial femoral and popliteal artery  #5.  Patient will be started on low-dose statin   V. [name], M.D. [facility] Office: [phone] Pager:  [phone]

## 2015-10-30 NOTE — Telephone Encounter (Signed)
-----   Message from Denman George, RN sent at 10/30/2015  9:46 AM EST ----- Regarding: needs 1 mo. f/u with NP with (L) LE Art. Duplex and ABI's   ----- Message -----    From: Serafina Mitchell, MD    Sent: 10/30/2015   9:14 AM      To: [facility] Charge Pool  10/30/2015:  Surgeon:  [name] Procedure Performed:  1.  Ultrasound-guided access, right femoral artery  2.  Abdominal aortogram  3.  Second order catheterization  4.  Bilateral lower extremity runoff  5.  Stent, left superficial femoral artery  6.  Angioplasty, in-stent stenosis, left popliteal artery    Follow up with duplex of the left leg and ABIs in one month with Vinnie Level

## 2015-10-30 NOTE — H&P (View-Only) (Signed)
[facility] [place] HISTORY AND PHYSICAL -PAD  History of Present Illness [name] is a 81 y.o. female patient of [name] who is status post left SFA to popliteal bypass graft that was done in 2010, she also had left SFA stent in June 2011 and 2012. She is also s/p stenting of left EIA and left SFA on 12/26/14.   She returns today for LE arterial evaluation. As best can be determined from pt's description, she did not have claudication symptoms before she fell October 2014, since her fall she has not walked much. Pt denies non healing wounds. Pt reports a couple of TIA's about 2000. Pt was receiving home physical therapy twice weekly, and does exercises on her own that she was taught.  Pt reports New Medical or Surgical History: Around Thanksgiving 2016 she developed bronchitis, is under treatment by her PCP for this and feels improved.   fell October 2014 and fractured left pelvis, had left elbow replacement, fractured left ankle and wrist.  Her lower legs cramping at rest is relieved by mustard by mouth.   Pt Diabetic: No Pt smoker: non-smoker  Pt meds include: Statin :No, statins cause aching Betablocker: Yes ASA: Yes Other anticoagulants/antiplatelets: Plavix     Past Medical History  Diagnosis Date  . Hypertension   . Stroke [facility])     History of TIA  . History of angina   . Peripheral vascular disease ([place])   . Fall Oct. 15, 2014    Fx. pelvis, Left Hip, Left Elbow  . DVT (deep venous thrombosis) ([place])   . Cancer [facility]) Aug. 2015    SCC - Right Lower Leg    Social History Social History  Substance Use Topics  . Smoking status: Former Smoker    Types: Cigarettes    Quit date: 11/17/1946  . Smokeless tobacco: Never Used  . Alcohol Use: No    Family History Family History  Problem Relation Age of Onset  . Heart disease Father     Heart Disease before age 43  . Hyperlipidemia Father   . Hypertension Father   . Alcohol abuse  Father   . Hyperlipidemia Sister   . Hypertension Sister   . Heart disease Brother   . Hyperlipidemia Brother   . Hypertension Brother   . Deep vein thrombosis Brother   . Heart disease Son     Heart Disease before age 28  . Hyperlipidemia Son   . Hypertension Son   . Heart attack Son   . Diabetes Son   . Hypertension Son     Past Surgical History  Procedure Laterality Date  . Abdominal hysterectomy    . Pr vein bypass graft,aorto-fem-pop  09-13-09    Left Fem-pop  . Femoral artery stent  12-11-10    Left SFA  . Total elbow arthroplasty Left 09/03/2013    Procedure: LEFT TOTAL ELBOW ARTHROPLASTY;  Surgeon: Roseanne Kaufman, MD;  Location: [facility];  Service: Orthopedics;  Laterality: Left;  . Joint replacement      knee  . Joint replacement Left Oct. 17, 2014    Elbow ( pt fell 08-31-13 )  . Abdominal aortagram N/A 12/26/2014    Procedure: ABDOMINAL Maxcine Ham;  Surgeon: Serafina Mitchell, MD;  Location: [facility] CATH LAB;  Service: Cardiovascular;  Laterality: N/A;  . Insertion of iliac stent Left 12/26/2014    Procedure: INSERTION OF ILIAC STENT;  Surgeon: Serafina Mitchell, MD;  Location: [facility] CATH LAB;  Service:  Cardiovascular;  Laterality: Left;    Allergies  Allergen Reactions  . Promethazine Hcl Other (See Comments)    IV  Drug only, unknown   . Sulfa Antibiotics Other (See Comments)    Unknown     Current Outpatient Prescriptions  Medication Sig Dispense Refill  . acetaminophen (TYLENOL) 500 MG tablet Take 1,000 mg by mouth every 6 (six) hours as needed for pain. Patient took this medication for her pain.    Marland Kitchen aspirin 81 MG tablet Take 81 mg by mouth daily.    . bisacodyl (DULCOLAX) 10 MG suppository Place 1 suppository (10 mg total) rectally daily as needed. 12 suppository 0  . Cholecalciferol (VITAMIN D3) 2000 UNITS TABS Take 2,000 Units by mouth daily.    . clopidogrel (PLAVIX) 75 MG tablet Take 75 mg by mouth daily.    Marland Kitchen HYDROcodone-acetaminophen (NORCO/VICODIN) 5-325 MG per  tablet Take 1-2 tablets by mouth every 4 (four) hours as needed. 80 tablet 0  . metoprolol (LOPRESSOR) 50 MG tablet Take 25-50 mg by mouth 2 (two) times daily. Take 50mg  by mouth in the morning and take 25mg  by mouth at bedtime.    . Multiple Vitamin (MULTIVITAMIN) tablet Take 1 tablet by mouth daily.    . Multiple Vitamins-Minerals (EYE VITAMINS PO) Take 1 tablet by mouth daily.     . polyethylene glycol (MIRALAX / GLYCOLAX) packet Take 17 g by mouth daily as needed. 14 each 0  . valsartan-hydrochlorothiazide (DIOVAN-HCT) 160-12.5 MG per tablet Take 0.5 tablets by mouth daily.    Marland Kitchen amitriptyline (ELAVIL) 25 MG tablet Take 25 mg by mouth at bedtime.      No current facility-administered medications for this visit.    ROS: See HPI for pertinent positives and negatives.   Physical Examination  Filed Vitals:   10/22/15 1425 10/22/15 1426  BP: 150/80 156/93  Pulse: 68   Temp: 97.7 F (36.5 C)   TempSrc: Oral   Height: 5\' 2"  (1.575 m)   Weight: 129 lb 4.8 oz (58.65 kg)   SpO2: 100%    Body mass index is 23.64 kg/(m^2).  General: A&O x 3, WDWN  Gait: using quad cane, slow and deliberate Eyes: Pupils equal Pulmonary: CTAB, no wheezes ,rhonchi, or rales. Occasional cough, blowing her nose. Cardiac: regular rhythm, no detected murmur     Carotid Bruits Left Right   negative Negative  Aorta: is not palpable Radial pulses: are 2+ palpable and =   VASCULAR EXAM: Extremities with ischemic changes: mottled cyanosis on sole of left foot,  without Gangrene; without open wounds.     LE Pulses LEFT RIGHT   FEMORAL 1+ palpable  1+ palpable    POPLITEAL not palpable  not palpable   POSTERIOR TIBIAL  not palpable   Not palpable    DORSALIS PEDIS   ANTERIOR TIBIAL not palpable  not palpable    Abdomen: soft, NT, no palpable masses. Skin: no rashes, no ulcers. Musculoskeletal: no muscle wasting or atrophy. Neurologic: A&O X 3; Appropriate Affect; MOTOR FUNCTION: moving all extremities equally, motor strength 3/5 throughout. Speech is fluent/normal. CN 2-12 intact.          Non-Invasive Vascular Imaging: DATE: 10/22/2015 LOWER EXTREMITY ARTERIAL EVALUATION    INDICATION: Peripheral vascular disease    PREVIOUS INTERVENTION(S): Left distal superficial femoral to popliteal artery bypass graft 09/13/2009; Left superficial femoral artery stents placed 04/25/2010 &12/11/2010; Left external iliac artery stent and left superficial femoral artery angioplasty 12/26/2014.    DUPLEX EXAM: Duplex evaluation  of the lower extremity arterial system to include the common femoral, superficial femoral, popliteal, and tibial arteries and bypass graft(s) and/or stent(s) if present.      FINDINGS:                                       RIGHT LOWER EXTREMITY:  Diffuse heterogeneous plaque throughout the right lower extremity arterial system, with areas of significant stenosis as noted below.    LEFT LOWER EXTREMITY:  Diffuse heterogeneous plaque present involving the native arterial system of the left lower extremity. Limited visualization of the left proximal to mid segment of the left distal superficial femoral artery to popliteal artery bypass graft.        See the attached diagram for velocities.  IMPRESSION:  Hemodynamically significant stenosis present of 50%-74% involving the right proximal superficial femoral artery. Hemodynamically significant stenosis present of 75%-99% involving the left mid superficial femoral artery. Patent left proximal and distal superficial femoral artery stents, no hemodynamically significant plaque present. When and where visualized left distal superficial femoral to popliteal artery bypass graft  appears patent. No color or spectral Doppler waveform could be obtained involving the left peroneal artery suggestive of vessel occlusion.    ANKLE/BRACHIAL INDEX - Right = 0.58                     Left =   0.76         (Please see complete report)               ABI (Date: 10/22/2015)  R: 0.58 (0.65, 04/02/15), DP: monophasic, PT: monophasic, TBI: 0.28  L: 0.76 (1.01), DP: monophasic, PT: monophasic, TBI: 0.61     ASSESSMENT: [name] is a 81 y.o. female who presents s/p left distal superficial femoral to popliteal artery bypass graft 09/13/2009, left superficial femoral artery stents placed 04/25/2010 &12/11/2010; left external iliac artery stent, and left superficial femoral artery angioplasty 12/26/2014. She has night legs cramps relieved by po mustard. It is difficult to ascertain whether she has claudication symptoms with walking, but it seems that she describes bilateral calf claudication with walking. She has not been walking much since Thanksgiving when she developed bronchitis and has back and hip pain with standing also.   Today's bilateral LE arterial duplex suggests hemodynamically significant stenosis present of 50%-74% involving the right proximal superficial femoral artery. Hemodynamically significant stenosis present of 75%-99% involving the left mid superficial femoral artery. Patent left proximal and distal superficial femoral artery stents, no hemodynamically significant plaque present. When and where visualized left distal superficial femoral to popliteal artery bypass graft appears patent. No color or spectral Doppler waveform could be obtained involving the left peroneal artery suggestive of vessel occlusion. Right ABI remains stable with moderate arterial occlusive disease, but the left ABI decreased from normal to moderate arterial occlusive disease since May of this year. The sole of her left foot is mottled and cool. See Plan. Face to face  time with patient was 25 minutes. Over 50% of this time was spent on counseling and coordination of care.   PLAN:  Based on the patient's vascular studies and examination, and after discussing with [name], pt will be scheduled for arteriogram with bilateral LE run off, possible intervention in left LE.   I discussed in depth with the patient the nature of atherosclerosis, and emphasized the importance of maximal  medical management including strict control of blood pressure, blood glucose, and lipid levels, obtaining regular exercise, and continued cessation of smoking.  The patient is aware that without maximal medical management the underlying atherosclerotic disease process will progress, limiting the benefit of any interventions.  The patient was given information about PAD including signs, symptoms, treatment, what symptoms should prompt the patient to seek immediate medical care, and risk reduction measures to take.  Clemon Chambers, RN, MSN, FNP-C [facility] Phone: [phone]  Clinic MD: Trula Slade  10/22/2015 2:31 PM

## 2015-10-30 NOTE — Progress Notes (Signed)
Act result 172. 28fr long sheath aspirated and removed from rfa, manual pressue applied for 20 minutes. Groin level 0. Distal pulses present right dp and pt with doppler. Left dp palpable and strong, left pt palpable and weak. Tegaderm dressing applied  Bedrest instructions given.   Bedrest begins at 12 :05:00

## 2015-10-30 NOTE — Interval H&P Note (Signed)
History and Physical Interval Note:  10/30/2015 8:17 AM  [name]  has presented today for surgery, with the diagnosis of Stenosis of Left Lower extremeity  The various methods of treatment have been discussed with the patient and family. After consideration of risks, benefits and other options for treatment, the patient has consented to  Procedure(s): Abdominal Aortogram w/Lower Extremity (N/A) as a surgical intervention .  The patient's history has been reviewed, patient examined, no change in status, stable for surgery.  I have reviewed the patient's chart and labs.  Questions were answered to the patient's satisfaction.     Letitia Sabala, [place]  Velocity elevation on left leg by u/s, plan for angio and intervention as needed  WB

## 2015-10-31 ENCOUNTER — Encounter (HOSPITAL_COMMUNITY): Payer: Self-pay | Admitting: Surgery

## 2015-11-30 ENCOUNTER — Encounter: Payer: Self-pay | Admitting: Family

## 2015-12-03 ENCOUNTER — Other Ambulatory Visit: Payer: Self-pay | Admitting: Surgery

## 2015-12-03 ENCOUNTER — Ambulatory Visit (HOSPITAL_COMMUNITY)
Admit: 2015-12-03 | Discharge: 2015-12-03 | Disposition: A | Discharge status: Home / Self Care | Payer: Medicare Other | Source: Ambulatory Visit | Attending: Family | Admitting: Family

## 2015-12-03 ENCOUNTER — Ambulatory Visit (INDEPENDENT_AMBULATORY_CARE_PROVIDER_SITE_OTHER): Admit: 2015-12-03 | Discharge: 2015-12-03 | Disposition: A | Discharge status: Home / Self Care | Payer: Medicare Other

## 2015-12-03 DIAGNOSIS — E785 Hyperlipidemia, unspecified: Secondary | ICD-10-CM | POA: Insufficient documentation

## 2015-12-03 DIAGNOSIS — Z48812 Encounter for surgical aftercare following surgery on the circulatory system: Secondary | ICD-10-CM

## 2015-12-03 DIAGNOSIS — Z95828 Presence of other vascular implants and grafts: Secondary | ICD-10-CM | POA: Insufficient documentation

## 2015-12-03 DIAGNOSIS — I10 Essential (primary) hypertension: Secondary | ICD-10-CM | POA: Insufficient documentation

## 2015-12-03 DIAGNOSIS — I739 Peripheral vascular disease, unspecified: Secondary | ICD-10-CM

## 2015-12-07 ENCOUNTER — Ambulatory Visit: Payer: Self-pay | Admitting: Family

## 2015-12-07 ENCOUNTER — Ambulatory Visit (INDEPENDENT_AMBULATORY_CARE_PROVIDER_SITE_OTHER): Payer: Medicare Other | Admitting: Family

## 2015-12-07 ENCOUNTER — Encounter: Payer: Self-pay | Admitting: Family

## 2015-12-07 VITALS — BP 174/81 | HR 74 | Temp 97.2°F | Resp 16 | Ht 62.0 in | Wt 128.0 lb

## 2015-12-07 DIAGNOSIS — Z95828 Presence of other vascular implants and grafts: Secondary | ICD-10-CM

## 2015-12-07 DIAGNOSIS — I739 Peripheral vascular disease, unspecified: Secondary | ICD-10-CM

## 2015-12-07 DIAGNOSIS — Z8679 Personal history of other diseases of the circulatory system: Secondary | ICD-10-CM

## 2015-12-07 DIAGNOSIS — R0989 Other specified symptoms and signs involving the circulatory and respiratory systems: Secondary | ICD-10-CM

## 2015-12-07 DIAGNOSIS — Z959 Presence of cardiac and vascular implant and graft, unspecified: Secondary | ICD-10-CM

## 2015-12-07 DIAGNOSIS — Z9862 Peripheral vascular angioplasty status: Secondary | ICD-10-CM

## 2015-12-07 DIAGNOSIS — Z8739 Personal history of other diseases of the musculoskeletal system and connective tissue: Secondary | ICD-10-CM

## 2015-12-07 NOTE — Progress Notes (Signed)
[facility] [place] HISTORY AND PHYSICAL -PAD  History of Present Illness [name] is a 82 y.o. female patient of [name] who is status post left SFA to popliteal bypass graft that was done in 2010, she also had left SFA stent in June 2011 and 2012. She is also s/p stenting of left EIA and left SFA on 12/26/14.  On 10/30/15 [name] performed an arteriogram; findings and interventions were: Impression: #1 greater than 90% native superficial femoral artery stenosis on the left which was successfully treated using a [employer] ANOVA 6 x 40 self-expanding stent with residual stenosis less than 5%. #2 balloon angioplasty of in-stent stenosis within the proximal above-knee popliteal anastomosis. Preintervention stenosis was greater than 60%, post was less than 10% #3 two-vessel runoff on the left leg #4 multiple high-grade near occlusive lesions within the right superficial femoral and popliteal artery   Daughter states [name] recommends treating the right leg arterial stenoses conservatively for as long as possible due to the prospect of restenosis.   She returns today for LE arterial evaluation. As best can be determined from pt's description, she did not have claudication symptoms before she fell October 2014, since her fall she has not walked much. Pt denies non healing wounds. Pt reports a couple of TIA's about 2000, none subsequently. Pt was receiving home physical therapy twice weekly, and does exercises on her own that she was taught.  Pt reports New Medical or Surgical History: Around Thanksgiving 2016 she developed bronchitis, treated by her PCP for this and feels improved.  [place] October 2014 and fractured left pelvis, had left elbow replacement, fractured left ankle and wrist.  She needs a left hip replacement but does not want to have that done at this point.  Her lower legs  cramping at rest is relieved by mustard by mouth.   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   Pt Diabetic: No Pt smoker: non-smoker  Pt meds include: Statin :No, statins cause aching Betablocker: Yes ASA: Yes Other anticoagulants/antiplatelets: Plavix      Past Medical History  Diagnosis Date  . Hypertension   . Stroke [facility])     History of TIA  . History of angina   . Peripheral vascular disease ([place])   . Fall Oct. 15, 2014    Fx. pelvis, Left Hip, Left Elbow  . DVT (deep venous thrombosis) ([place])   . Cancer [facility]) Aug. 2015    SCC - Right Lower Leg    Social History Social History  Substance Use Topics  . Smoking status: Former Smoker    Types: Cigarettes    Quit date: 11/17/1946  . Smokeless tobacco: Never Used  . Alcohol Use: No    Family History Family History  Problem Relation Age of Onset  . Heart disease Father     Heart Disease before age 19  . Hyperlipidemia Father   . Hypertension Father   . Alcohol abuse Father   . Hyperlipidemia Sister   . Hypertension Sister   . Heart disease Brother   . Hyperlipidemia Brother   . Hypertension Brother   . Deep vein thrombosis Brother   . Heart disease Son     Heart Disease before age 6  . Hyperlipidemia Son   . Hypertension Son   . Heart attack Son   . Diabetes Son   . Hypertension Son  Past Surgical History  Procedure Laterality Date  . Abdominal hysterectomy    . Pr vein bypass graft,aorto-fem-pop  09-13-09    Left Fem-pop  . Femoral artery stent  12-11-10    Left SFA  . Total elbow arthroplasty Left 09/03/2013    Procedure: LEFT TOTAL ELBOW ARTHROPLASTY;  Surgeon: Roseanne Kaufman, MD;  Location: [place];  Service: Orthopedics;  Laterality: Left;  . Joint replacement      knee  . Joint replacement Left Oct. 17, 2014    Elbow ( pt fell 08-31-13 )  . Abdominal aortagram N/A 12/26/2014    Procedure:  ABDOMINAL Maxcine Ham;  Surgeon: Serafina Mitchell, MD;  Location: [facility] CATH LAB;  Service: Cardiovascular;  Laterality: N/A;  . Insertion of iliac stent Left 12/26/2014    Procedure: INSERTION OF ILIAC STENT;  Surgeon: Serafina Mitchell, MD;  Location: [facility] CATH LAB;  Service: Cardiovascular;  Laterality: Left;  . Peripheral vascular catheterization N/A 10/30/2015    Procedure: Abdominal Aortogram w/Lower Extremity;  Surgeon: Serafina Mitchell, MD;  Location: [place] CV LAB;  Service: Cardiovascular;  Laterality: N/A;  . Peripheral vascular catheterization  10/30/2015    Procedure: Peripheral Vascular Intervention;  Surgeon: Serafina Mitchell, MD;  Location: [place] CV LAB;  Service: Cardiovascular;;    Allergies  Allergen Reactions  . Promethazine Hcl Other (See Comments)    IV  Drug only, unknown   . Sulfa Antibiotics Other (See Comments)    Unknown     Current Outpatient Prescriptions  Medication Sig Dispense Refill  . acetaminophen (TYLENOL) 500 MG tablet Take 1,000 mg by mouth every 6 (six) hours as needed for pain. Patient took this medication for her pain.    Marland Kitchen aspirin 81 MG tablet Take 81 mg by mouth daily.    . Cholecalciferol (VITAMIN D3) 2000 UNITS TABS Take 2,000 Units by mouth daily.    . clopidogrel (PLAVIX) 75 MG tablet Take 75 mg by mouth daily.    . diazepam (VALIUM) 5 MG tablet Take 5 mg by mouth every 6 (six) hours as needed for anxiety.    . metoprolol (LOPRESSOR) 50 MG tablet Take 25-50 mg by mouth 2 (two) times daily. Take 50mg  by mouth in the morning and take 25mg  by mouth at bedtime.    . Multiple Vitamin (MULTIVITAMIN) tablet Take 1 tablet by mouth daily.    . Multiple Vitamins-Minerals (EYE VITAMINS PO) Take 1 tablet by mouth daily.     . valsartan-hydrochlorothiazide (DIOVAN-HCT) 160-12.5 MG per tablet Take 0.5 tablets by mouth daily.    Marland Kitchen atorvastatin (LIPITOR) 10 MG tablet Take 1 tablet (10 mg total) by mouth daily. (Patient not taking: Reported on 12/07/2015) 30  tablet 0   No current facility-administered medications for this visit.    ROS: See HPI for pertinent positives and negatives.   Physical Examination  Filed Vitals:   12/07/15 1432 12/07/15 1438  BP: 177/80 174/81  Pulse: 72 74  Temp: 97.2 F (36.2 C)   Resp: 16   Height: 5\' 2"  (1.575 m)   Weight: 128 lb (58.06 kg)   SpO2: 100%    Body mass index is 23.41 kg/(m^2).  General: A&O x 3, WDWN  Gait: using quad cane, slow and deliberate Eyes: Pupils equal Pulmonary: CTAB, no wheezes ,rhonchi, or rales.  Cardiac: regular rhythm, no detected murmur     Carotid Bruits Left Right   positive Negative  Aorta: is not palpable Radial pulses: are 2+ palpable and =  VASCULAR EXAM: Extremities without ischemic changes,  without Gangrene; without open wounds.     LE Pulses LEFT RIGHT   FEMORAL 1+ palpable  1+ palpable    POPLITEAL not palpable  not palpable   POSTERIOR TIBIAL  not palpable   Not palpable    DORSALIS PEDIS  ANTERIOR TIBIAL not palpable  not palpable    Abdomen: soft, NT, no palpable masses. Skin: no rashes, no ulcers. Musculoskeletal: no muscle wasting or atrophy. Neurologic: A&O X 3; Appropriate Affect; MOTOR FUNCTION: moving all extremities equally, motor strength 3/5 throughout. Speech is fluent/normal. CN 2-12 intact.              Non-Invasive Vascular Imaging: DATE: 12/03/15 LOWER EXTREMITY ARTERIAL EVALUATION    INDICATION: Peripheral vascular disease    PREVIOUS INTERVENTION(S): Left lower extremity angioplasty with stenting 10/30/2015.    DUPLEX EXAM:     RIGHT  LEFT   Peak Systolic Velocity (cm/s) Ratio (if abnormal) Waveform  Peak Systolic Velocity (cm/s) Ratio (if  abnormal) Waveform     Artery - Proximal to Stent 162  B     Stent - Origin 230  B     Stent - Proximal        Stent - Mid 60  B     Stent - Distal         Stent - End 90  B     Artery - Distal to Stent 167  T  0.56/0.18 Today's ABI / TBI 0.90/0.64  0.58/0.28 Previous ABI / TBI (10/22/2015 ) 0.76/0.61    Waveform:    M - Monophasic       B - Biphasic       T - Triphasic  If Ankle Brachial Index (ABI) or Toe Brachial Index (TBI) performed, please see complete report     ADDITIONAL FINDINGS: See attached diagram for left superficial femoral distal thigh stent.     IMPRESSION: Patent proximal and distal left superficial femoral artery stent present. Elevated velocities present involving the left proximal superficial femoral artery suggestive of 50%-74% stenosis. Left dampened posterior tibial artery waveform present suggestive of significant stenosis however unable to well visualize due to calcific vessel walls.    Compared to the previous exam:  Stable right ankle brachial index and improved left ankle brachial index since previous study on 10/22/2015.    ASSESSMENT: [name] is a 82 y.o. female who is s/p left distal superficial femoral to popliteal artery bypass graft 09/13/2009, left superficial femoral artery stents placed 04/25/2010 &12/11/2010; left external iliac artery stent, left superficial femoral artery angioplasty 12/26/2014, angioplasty of in-stent stenosis within the proximal above-knee popliteal anastomosis and stent placement in left SFA on 10/30/15 . She has night legs cramps relieved by po mustard. It is difficult to ascertain whether she has claudication symptoms with walking, but it seems that she describes bilateral calf claudication with walking. She has not been walking much, has back and hip pain with standing.   12/03/15 left LE arterial duplex suggests patent proximal and distal left superficial femoral artery stent present. Elevated velocities present  involving the left proximal superficial femoral artery suggestive of 50%-74% stenosis. Left dampened posterior tibial artery waveform present suggestive of significant stenosis however unable to well visualize due to calcific vessel walls. Stable right ankle brachial index and improved left ankle brachial index since previous study on 10/22/2015. Monophasic waveforms in right ankle, triphasic in left ankle.   Left carotid bruit, no TIA since 2000.  Face  to face time with patient was 25 minutes. Over 50% of this time was spent on counseling and coordination of care.   PLAN:  Based on the patient's vascular studies and examination, pt will return to clinic in 3 months with ABI's, left lower extremity arterial duplex, and carotid duplex.  I discussed in depth with the patient the nature of atherosclerosis, and emphasized the importance of maximal medical management including strict control of blood pressure, blood glucose, and lipid levels, obtaining regular exercise, and continued cessation of smoking.  The patient is aware that without maximal medical management the underlying atherosclerotic disease process will progress, limiting the benefit of any interventions.  The patient was given information about PAD including signs, symptoms, treatment, what symptoms should prompt the patient to seek immediate medical care, and risk reduction measures to take.  Clemon Chambers, RN, MSN, FNP-C [facility] [employer] Phone: [phone]  Clinic MD: Bridgett Larsson  12/07/2015 2:38 PM

## 2015-12-07 NOTE — Progress Notes (Signed)
Filed Vitals:   12/07/15 1432 12/07/15 1438  BP: 177/80 174/81  Pulse: 72 74  Temp: 97.2 F (36.2 C)   Resp: 16   Height: 5\' 2"  (1.575 m)   Weight: 128 lb (58.06 kg)   SpO2: 100%

## 2016-01-08 ENCOUNTER — Encounter: Payer: Self-pay | Admitting: *Deleted

## 2016-01-08 DIAGNOSIS — Z006 Encounter for examination for normal comparison and control in clinical research program: Secondary | ICD-10-CM

## 2016-01-08 NOTE — Progress Notes (Signed)
Called [name] for her 1 year follow-up for the [facility] registry. [name] has had an additional intervention in December and a duplex in January. I will follow-up again in one year.

## 2016-01-11 ENCOUNTER — Telehealth: Payer: Self-pay | Admitting: *Deleted

## 2016-01-11 NOTE — Telephone Encounter (Signed)
Patient called concerning taking a trip whether she should drive or fly.  I spoke to Vinnie Level Nickel  NP concerning the matter.  The patient stated that it is a trip to [facility]. I explained to the patient the importance of 20-30 thigh high compression stockings and states that she has these and to be properly fitted for them.  If driving I explained that stopping every hour or so and walking would be very beneficial and moving her feet around in car.  As for flying if need be,  I explained that every opportunity to stand, walk and move legs would be helpful.  Patient voiced understanding of these instructions.

## 2016-03-14 ENCOUNTER — Encounter: Payer: Self-pay | Admitting: Family

## 2016-03-19 ENCOUNTER — Ambulatory Visit (HOSPITAL_COMMUNITY)
Admission: RE | Admit: 2016-03-19 | Discharge: 2016-03-19 | Disposition: A | Discharge status: Home / Self Care | Payer: Medicare Other | Source: Ambulatory Visit | Attending: Family | Admitting: Family

## 2016-03-19 ENCOUNTER — Encounter: Payer: Self-pay | Admitting: Family

## 2016-03-19 ENCOUNTER — Ambulatory Visit (INDEPENDENT_AMBULATORY_CARE_PROVIDER_SITE_OTHER)
Admission: RE | Admit: 2016-03-19 | Discharge: 2016-03-19 | Disposition: A | Discharge status: Home / Self Care | Payer: Medicare Other | Source: Ambulatory Visit | Attending: Family | Admitting: Family

## 2016-03-19 ENCOUNTER — Ambulatory Visit (INDEPENDENT_AMBULATORY_CARE_PROVIDER_SITE_OTHER): Payer: Medicare Other | Admitting: Family

## 2016-03-19 ENCOUNTER — Other Ambulatory Visit: Payer: Self-pay | Admitting: *Deleted

## 2016-03-19 VITALS — BP 171/77 | HR 68 | Ht 62.0 in | Wt 129.2 lb

## 2016-03-19 DIAGNOSIS — Z95828 Presence of other vascular implants and grafts: Secondary | ICD-10-CM

## 2016-03-19 DIAGNOSIS — T82856D Stenosis of peripheral vascular stent, subsequent encounter: Secondary | ICD-10-CM | POA: Diagnosis not present

## 2016-03-19 DIAGNOSIS — I6523 Occlusion and stenosis of bilateral carotid arteries: Secondary | ICD-10-CM | POA: Diagnosis not present

## 2016-03-19 DIAGNOSIS — Z9862 Peripheral vascular angioplasty status: Secondary | ICD-10-CM

## 2016-03-19 DIAGNOSIS — I739 Peripheral vascular disease, unspecified: Secondary | ICD-10-CM

## 2016-03-19 DIAGNOSIS — R0989 Other specified symptoms and signs involving the circulatory and respiratory systems: Secondary | ICD-10-CM

## 2016-03-19 DIAGNOSIS — I10 Essential (primary) hypertension: Secondary | ICD-10-CM | POA: Insufficient documentation

## 2016-03-19 DIAGNOSIS — Z959 Presence of cardiac and vascular implant and graft, unspecified: Secondary | ICD-10-CM

## 2016-03-19 DIAGNOSIS — T82856A Stenosis of peripheral vascular stent, initial encounter: Secondary | ICD-10-CM | POA: Insufficient documentation

## 2016-03-19 DIAGNOSIS — I70202 Unspecified atherosclerosis of native arteries of extremities, left leg: Secondary | ICD-10-CM | POA: Diagnosis not present

## 2016-03-19 DIAGNOSIS — Y838 Other surgical procedures as the cause of abnormal reaction of the patient, or of later complication, without mention of misadventure at the time of the procedure: Secondary | ICD-10-CM | POA: Diagnosis not present

## 2016-03-19 NOTE — Progress Notes (Signed)
[facility] [place] HISTORY AND PHYSICAL -PAD  History of Present Illness [name] is a 82 y.o. female patient of [name] who is status post left SFA to popliteal bypass graft that was done in 2010, she also had left SFA stent in June 2011 and 2012. She is also s/p stenting of left EIA and left SFA on 12/26/14.  On 10/30/15 [name] performed an arteriogram; findings and interventions were: Impression: #1 greater than 90% native superficial femoral artery stenosis on the left which was successfully treated using a [employer] ANOVA 6 x 40 self-expanding stent with residual stenosis less than 5%. #2 balloon angioplasty of in-stent stenosis within the proximal above-knee popliteal anastomosis. Preintervention stenosis was greater than 60%, post was less than 10% #3 two-vessel runoff on the left leg #4 multiple high-grade near occlusive lesions within the right superficial femoral and popliteal artery   Daughter states [name] recommends treating the right leg arterial stenoses conservatively for as long as possible due to the prospect of restenosis.   She returns today for LE arterial evaluation. As best can be determined from pt's description, she did not have claudication symptoms before she fell October 2014, since her fall she has not walked much. Pt denies non healing wounds. Pt reports a couple of TIA's about 2000, none subsequently. Pt was receiving home physical therapy twice weekly, and does exercises on her own that she was taught.  Pt reports New Medical or Surgical History: Around Thanksgiving 2016 she developed bronchitis, treated by her PCP for this and feels improved.  [place] October 2014 and fractured left pelvis, had left elbow replacement, fractured left ankle and wrist.  She needs a left hip replacement but does not want to have that done at this point.  Her lower legs  cramping at rest is relieved by mustard by mouth.   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   Pt Diabetic: No Pt smoker: non-smoker  Pt meds include: Statin :No, statins cause aching Betablocker: Yes ASA: Yes Other anticoagulants/antiplatelets: Plavix     Past Medical History  Diagnosis Date  . Hypertension   . Stroke [facility])     History of TIA  . History of angina   . Peripheral vascular disease ([place])   . Fall Oct. 15, 2014    Fx. pelvis, Left Hip, Left Elbow  . DVT (deep venous thrombosis) ([place])   . Cancer [facility]) Aug. 2015    SCC - Right Lower Leg    Social History Social History  Substance Use Topics  . Smoking status: Former Smoker    Types: Cigarettes    Quit date: 11/17/1946  . Smokeless tobacco: Never Used  . Alcohol Use: No    Family History Family History  Problem Relation Age of Onset  . Heart disease Father     Heart Disease before age 80  . Hyperlipidemia Father   . Hypertension Father   . Alcohol abuse Father   . Hyperlipidemia Sister   . Hypertension Sister   . Heart disease Brother   . Hyperlipidemia Brother   . Hypertension Brother   . Deep vein thrombosis Brother   . Heart disease Son     Heart Disease before age 70  . Hyperlipidemia Son   . Hypertension Son   . Heart attack Son   . Diabetes Son   . Hypertension Son  Past Surgical History  Procedure Laterality Date  . Abdominal hysterectomy    . Pr vein bypass graft,aorto-fem-pop  09-13-09    Left Fem-pop  . Femoral artery stent  12-11-10    Left SFA  . Total elbow arthroplasty Left 09/03/2013    Procedure: LEFT TOTAL ELBOW ARTHROPLASTY;  Surgeon: Roseanne Kaufman, MD;  Location: [place];  Service: Orthopedics;  Laterality: Left;  . Joint replacement      knee  . Joint replacement Left Oct. 17, 2014    Elbow ( pt fell 08-31-13 )  . Abdominal aortagram N/A 12/26/2014    Procedure: ABDOMINAL  Maxcine Ham;  Surgeon: Serafina Mitchell, MD;  Location: [facility] CATH LAB;  Service: Cardiovascular;  Laterality: N/A;  . Insertion of iliac stent Left 12/26/2014    Procedure: INSERTION OF ILIAC STENT;  Surgeon: Serafina Mitchell, MD;  Location: [facility] CATH LAB;  Service: Cardiovascular;  Laterality: Left;  . Peripheral vascular catheterization N/A 10/30/2015    Procedure: Abdominal Aortogram w/Lower Extremity;  Surgeon: Serafina Mitchell, MD;  Location: [place] CV LAB;  Service: Cardiovascular;  Laterality: N/A;  . Peripheral vascular catheterization  10/30/2015    Procedure: Peripheral Vascular Intervention;  Surgeon: Serafina Mitchell, MD;  Location: [place] CV LAB;  Service: Cardiovascular;;    Allergies  Allergen Reactions  . Promethazine Hcl Other (See Comments)    IV  Drug only, unknown   . Sulfa Antibiotics Other (See Comments)    Unknown     Current Outpatient Prescriptions  Medication Sig Dispense Refill  . acetaminophen (TYLENOL) 500 MG tablet Take 1,000 mg by mouth every 6 (six) hours as needed for pain. Patient took this medication for her pain.    Marland Kitchen aspirin 81 MG tablet Take 81 mg by mouth daily.    . Cholecalciferol (VITAMIN D3) 2000 UNITS TABS Take 2,000 Units by mouth daily.    . clopidogrel (PLAVIX) 75 MG tablet Take 75 mg by mouth daily.    . diazepam (VALIUM) 5 MG tablet Take 5 mg by mouth every 6 (six) hours as needed for anxiety.    . metoprolol (LOPRESSOR) 50 MG tablet Take 25-50 mg by mouth 2 (two) times daily. Take 50mg  by mouth in the morning and take 25mg  by mouth at bedtime.    . Multiple Vitamin (MULTIVITAMIN) tablet Take 1 tablet by mouth daily.    . Multiple Vitamins-Minerals (EYE VITAMINS PO) Take 1 tablet by mouth daily.     . valsartan-hydrochlorothiazide (DIOVAN-HCT) 160-12.5 MG per tablet Take 0.5 tablets by mouth daily.    Marland Kitchen atorvastatin (LIPITOR) 10 MG tablet Take 1 tablet (10 mg total) by mouth daily. (Patient not taking: Reported on 12/07/2015) 30 tablet 0    No current facility-administered medications for this visit.    ROS: See HPI for pertinent positives and negatives.   Physical Examination  Filed Vitals:   03/19/16 1522 03/19/16 1525  BP: 185/83 171/77  Pulse: 68   Height: 5\' 2"  (1.575 m)   Weight: 129 lb 3.2 oz (58.605 kg)   SpO2: 100%    Body mass index is 23.63 kg/(m^2).  General: A&O x 3, WDWN  Gait: using walker, slow and deliberate Eyes: Pupils equal Pulmonary: CTAB, no wheezes ,rhonchi, or rales.  Cardiac: regular rhythm, no detected murmur     Carotid Bruits Left Right   positive Negative  Aorta: is not palpable Radial pulses: are 2+ palpable and =   VASCULAR EXAM: Extremities without ischemic changes,  without Gangrene;  without open wounds.     LE Pulses LEFT RIGHT   FEMORAL 1+ palpable  1+ palpable    POPLITEAL not palpable  not palpable   POSTERIOR TIBIAL  not palpable   Not palpable    DORSALIS PEDIS  ANTERIOR TIBIAL not palpable  not palpable    Abdomen: soft, NT, no palpable masses. Skin: no rashes, no ulcers. Musculoskeletal: no muscle wasting or atrophy. Neurologic: A&O X 3; Appropriate Affect; MOTOR FUNCTION: moving all extremities equally, motor strength 3/5 throughout. Speech is fluent/normal. CN 2-12 intact.                      Non-Invasive Vascular Imaging: DATE: 03/19/2016  CEREBROVASCULAR DUPLEX EVALUATION    INDICATION: Left carotid bruit    PREVIOUS INTERVENTION(S): None    DUPLEX EXAM: Carotid duplex    RIGHT  LEFT  Peak Systolic Velocities (cm/s) End Diastolic Velocities (cm/s) Plaque LOCATION Peak Systolic Velocities (cm/s) End Diastolic Velocities (cm/s) Plaque  79 11   CCA PROXIMAL 74 14 HT  88 14  CCA MID 87 15 HT  83 16 HT CCA DISTAL 82 16 HT  101 6 HT ECA 83 8   141 30 HT ICA PROXIMAL 69 17 HT  118 19  ICA MID 71 19   86 17  ICA DISTAL 100 17 HT    1.6 ICA / CCA Ratio (PSV) .79  Antegrade Vertebral Flow Antegrade  - Brachial Systolic Pressure (mmHg) -  Triphasic Brachial Artery Waveforms Triphasic    Plaque Morphology:  HM = Homogeneous, HT = Heterogeneous, CP = Calcific Plaque, SP = Smooth Plaque, IP = Irregular Plaque     ADDITIONAL FINDINGS: Multiphasic subclavian arteries, plaque noted in the right subclavian artery    IMPRESSION: 1. Less than 40% bilateral internal carotid artery stenosis    Compared to the previous exam:  No prior exam    LOWER EXTREMITY ARTERIAL EVALUATION    INDICATION: Periphereal vascular disease    PREVIOUS INTERVENTION(S): Left EIA, left SFA angioplasty 12/26/14; Stent in left SFA, angioplasty in-stent stenosis left popliteal artery 10/30/15    DUPLEX EXAM:     RIGHT  LEFT   Peak Systolic Velocity (cm/s) Ratio (if abnormal) Waveform  Peak Systolic Velocity (cm/s) Ratio (if abnormal) Waveform     Artery - Proximal to Stent 27  M     Stent - Origin 589 16.8 M     Stent - Proximal 437 12.4 M     Stent - Mid 40  M     Stent - Distal 24  M      Stent - End 17  M     Artery - Distal to Stent 85  M  0.55 Today's ABI / TBI 0.40  0.56 Previous ABI / TBI (  12/03/15) 0.90    Waveform:    M - Monophasic       B - Biphasic       T - Triphasic  If Ankle Brachial Index (ABI) or Toe Brachial Index (TBI) performed, please see complete report     ADDITIONAL FINDINGS:     IMPRESSION: 1. 50 - 74% left proximal  native superficial femoral artery stenosis 2. Greater than 75% stenosis in the left proximal thigh stent at origin and proximal stent. 3. The distal left SFA to popliteal stent appears patent with no stenosis visualized    Compared to the previous exam:  Increased velocity on the left since prior exam  ASSESSMENT: [name] is a 82 y.o. female who is s/p left distal superficial femoral to popliteal artery bypass graft 09/13/2009, left superficial femoral artery stents placed 04/25/2010 &12/11/2010; left external iliac artery stent, left superficial femoral artery angioplasty 12/26/2014, angioplasty of in-stent stenosis within the proximal above-knee popliteal anastomosis and stent placement in left SFA on 10/30/15 . She has night legs cramps relieved by po mustard. She c/o night time right foot cramping, but does no c/o claudication sx's with walking, but states her legs hurt all the time. 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   Today's left LE arterial duplex indicates 50 - 74% left proximal  native superficial femoral artery stenosis. Greater than 75% stenosis in the left proximal thigh stent at origin (589 cm/s) and proximal (437 cm/s) stent. The distal left SFA to popliteal stent appears patent with no stenosis visualize.  See Plan.  Left carotid bruit, no TIA since 2000. Today's carotid duplex suggests less than 40% bilateral internal carotid artery stenosis.   PLAN:  Based on the patient's vascular studies and examination, and after discussing with Dr. Scot Dock, pt will be scheduled for arteriogram with bilateral run off, possible intervention, for 589 and 437 cm/s velocities in left SFA stent, drop in ABI's from 90% (triphasic waveforms) to 40% (monophasic waveforms) in 4 months. Her creatinine in December 2016 was 1.0.  I discussed in depth with the patient the nature of atherosclerosis, and emphasized the importance of maximal medical management including strict control of blood pressure, blood glucose, and lipid levels, obtaining regular exercise, and continued cessation of smoking.  The patient is aware that without maximal medical management the underlying atherosclerotic  disease process will progress, limiting the benefit of any interventions.  The patient was given information about PAD including signs, symptoms, treatment, what symptoms should prompt the patient to seek immediate medical care, and risk reduction measures to take.  Clemon Chambers, RN, MSN, FNP-C [facility] [employer] Phone: [phone]  Clinic MD: Scot Dock  03/19/2016 3:52 PM

## 2016-03-25 ENCOUNTER — Ambulatory Visit (HOSPITAL_COMMUNITY)
Admission: RE | Admit: 2016-03-25 | Discharge: 2016-03-25 | Disposition: A | Discharge status: Home / Self Care | Payer: Medicare Other | Source: Ambulatory Visit | Attending: Surgery | Admitting: Surgery

## 2016-03-25 ENCOUNTER — Encounter (HOSPITAL_COMMUNITY)
Admission: RE | Disposition: A | Discharge status: Home / Self Care | Payer: Self-pay | Source: Ambulatory Visit | Attending: Surgery

## 2016-03-25 DIAGNOSIS — Z538 Procedure and treatment not carried out for other reasons: Secondary | ICD-10-CM | POA: Insufficient documentation

## 2016-03-25 LAB — POCT I-STAT, CHEM 8
BUN: 18 mg/dL (ref 6–20)
CREATININE: 1.1 mg/dL — AB (ref 0.44–1.00)
Calcium, Ion: 1.24 mmol/L (ref 1.13–1.30)
Chloride: 106 mmol/L (ref 101–111)
GLUCOSE: 87 mg/dL (ref 65–99)
HEMATOCRIT: 39 % (ref 36.0–46.0)
Hemoglobin: 13.3 g/dL (ref 12.0–15.0)
POTASSIUM: 4.1 mmol/L (ref 3.5–5.1)
Sodium: 141 mmol/L (ref 135–145)
TCO2: 24 mmol/L (ref 0–100)

## 2016-03-25 SURGERY — INVASIVE LAB ABORTED CASE

## 2016-03-25 MED ORDER — DIAZEPAM 5 MG PO TABS
ORAL_TABLET | ORAL | Status: AC
  Administered 2016-03-25: 2.5 mg
  Filled 2016-03-25: qty 1

## 2016-03-25 MED ORDER — SODIUM CHLORIDE 0.9 % IV SOLN
INTRAVENOUS | Status: DC
  Administered 2016-03-25: 10:00:00 via INTRAVENOUS

## 2016-03-25 MED ORDER — DIAZEPAM 5 MG PO TABS: 2.5000 mg | ORAL_TABLET | Freq: Once | ORAL | Status: DC

## 2016-03-25 SURGICAL SUPPLY — 4 items
KIT PV (KITS) ×3 IMPLANT
SYR MEDRAD MARK V 150ML (SYRINGE) ×3 IMPLANT
TRANSDUCER W/STOPCOCK (MISCELLANEOUS) ×3 IMPLANT
TRAY PV CATH (CUSTOM PROCEDURE TRAY) ×3 IMPLANT

## 2016-03-25 NOTE — Progress Notes (Signed)
Procedure cancelled due to physician being in an emergent procedure.  Cath lab staff spoke with pt, and she verbalized understanding.  Office will call pt to reschedule

## 2016-03-26 ENCOUNTER — Other Ambulatory Visit: Payer: Self-pay

## 2016-04-01 ENCOUNTER — Encounter (HOSPITAL_COMMUNITY): Payer: Self-pay | Admitting: *Deleted

## 2016-04-01 ENCOUNTER — Ambulatory Visit (HOSPITAL_COMMUNITY)
Admission: RE | Admit: 2016-04-01 | Discharge: 2016-04-01 | Disposition: A | Discharge status: Home / Self Care | Payer: Medicare Other | Source: Ambulatory Visit | Attending: Surgery | Admitting: Surgery

## 2016-04-01 ENCOUNTER — Other Ambulatory Visit: Payer: Self-pay | Admitting: *Deleted

## 2016-04-01 ENCOUNTER — Encounter (HOSPITAL_COMMUNITY)
Admission: RE | Disposition: A | Discharge status: Home / Self Care | Payer: Self-pay | Source: Ambulatory Visit | Attending: Surgery

## 2016-04-01 DIAGNOSIS — I6523 Occlusion and stenosis of bilateral carotid arteries: Secondary | ICD-10-CM | POA: Diagnosis not present

## 2016-04-01 DIAGNOSIS — I10 Essential (primary) hypertension: Secondary | ICD-10-CM | POA: Diagnosis not present

## 2016-04-01 DIAGNOSIS — Z7902 Long term (current) use of antithrombotics/antiplatelets: Secondary | ICD-10-CM | POA: Insufficient documentation

## 2016-04-01 DIAGNOSIS — I70202 Unspecified atherosclerosis of native arteries of extremities, left leg: Secondary | ICD-10-CM | POA: Insufficient documentation

## 2016-04-01 DIAGNOSIS — Z8249 Family history of ischemic heart disease and other diseases of the circulatory system: Secondary | ICD-10-CM | POA: Diagnosis not present

## 2016-04-01 DIAGNOSIS — Z87891 Personal history of nicotine dependence: Secondary | ICD-10-CM | POA: Insufficient documentation

## 2016-04-01 DIAGNOSIS — Y812 Prosthetic and other implants, materials and accessory general- and plastic-surgery devices associated with adverse incidents: Secondary | ICD-10-CM | POA: Insufficient documentation

## 2016-04-01 DIAGNOSIS — Z86718 Personal history of other venous thrombosis and embolism: Secondary | ICD-10-CM | POA: Insufficient documentation

## 2016-04-01 DIAGNOSIS — T82856A Stenosis of peripheral vascular stent, initial encounter: Secondary | ICD-10-CM | POA: Diagnosis not present

## 2016-04-01 DIAGNOSIS — Z7982 Long term (current) use of aspirin: Secondary | ICD-10-CM | POA: Insufficient documentation

## 2016-04-01 DIAGNOSIS — Z9181 History of falling: Secondary | ICD-10-CM | POA: Insufficient documentation

## 2016-04-01 DIAGNOSIS — Z8673 Personal history of transient ischemic attack (TIA), and cerebral infarction without residual deficits: Secondary | ICD-10-CM | POA: Diagnosis not present

## 2016-04-01 DIAGNOSIS — Z95820 Peripheral vascular angioplasty status with implants and grafts: Secondary | ICD-10-CM

## 2016-04-01 DIAGNOSIS — I739 Peripheral vascular disease, unspecified: Secondary | ICD-10-CM

## 2016-04-01 HISTORY — PX: PERIPHERAL VASCULAR CATHETERIZATION: SHX172C

## 2016-04-01 LAB — POCT I-STAT, CHEM 8
BUN: 23 mg/dL — ABNORMAL HIGH (ref 6–20)
CHLORIDE: 107 mmol/L (ref 101–111)
CREATININE: 1 mg/dL (ref 0.44–1.00)
Calcium, Ion: 1.24 mmol/L (ref 1.13–1.30)
GLUCOSE: 97 mg/dL (ref 65–99)
HCT: 36 % (ref 36.0–46.0)
Hemoglobin: 12.2 g/dL (ref 12.0–15.0)
POTASSIUM: 4 mmol/L (ref 3.5–5.1)
Sodium: 141 mmol/L (ref 135–145)
TCO2: 24 mmol/L (ref 0–100)

## 2016-04-01 LAB — POCT ACTIVATED CLOTTING TIME
ACTIVATED CLOTTING TIME: 219 s
ACTIVATED CLOTTING TIME: 183 s
Activated Clotting Time: 240 seconds
Activated Clotting Time: 214 seconds

## 2016-04-01 LAB — MRSA PCR SCREENING: MRSA BY PCR: NEGATIVE

## 2016-04-01 SURGERY — ABDOMINAL AORTOGRAM W/LOWER EXTREMITY

## 2016-04-01 MED ORDER — HEPARIN (PORCINE) IN NACL 2-0.9 UNIT/ML-% IJ SOLN
INTRAMUSCULAR | Status: DC | PRN
  Administered 2016-04-01: 1000 mL via INTRA_ARTERIAL

## 2016-04-01 MED ORDER — IODIXANOL 320 MG/ML IV SOLN
INTRAVENOUS | Status: DC | PRN
  Administered 2016-04-01: 180 mL via INTRA_ARTERIAL

## 2016-04-01 MED ORDER — HEPARIN SODIUM (PORCINE) 1000 UNIT/ML IJ SOLN
INTRAMUSCULAR | Status: AC
  Filled 2016-04-01: qty 1

## 2016-04-01 MED ORDER — HYDRALAZINE HCL 20 MG/ML IJ SOLN
INTRAMUSCULAR | Status: AC
  Filled 2016-04-01: qty 1

## 2016-04-01 MED ORDER — MIDAZOLAM HCL 2 MG/2ML IJ SOLN
INTRAMUSCULAR | Status: DC | PRN
  Administered 2016-04-01 (×3): 1 mg via INTRAVENOUS

## 2016-04-01 MED ORDER — HEPARIN SODIUM (PORCINE) 1000 UNIT/ML IJ SOLN
INTRAMUSCULAR | Status: DC | PRN
  Administered 2016-04-01: 1000 [IU] via INTRAVENOUS
  Administered 2016-04-01: 6000 [IU] via INTRAVENOUS
  Administered 2016-04-01: 2000 [IU] via INTRAVENOUS

## 2016-04-01 MED ORDER — SODIUM CHLORIDE 0.9 % IV SOLN: 1.0000 mL/kg/h | INTRAVENOUS | Status: DC

## 2016-04-01 MED ORDER — METOPROLOL TARTRATE 5 MG/5ML IV SOLN: 2.0000 mg | INTRAVENOUS | Status: DC | PRN

## 2016-04-01 MED ORDER — GUAIFENESIN-DM 100-10 MG/5ML PO SYRP: 15.0000 mL | ORAL_SOLUTION | ORAL | Status: DC | PRN

## 2016-04-01 MED ORDER — HEPARIN (PORCINE) IN NACL 2-0.9 UNIT/ML-% IJ SOLN
INTRAMUSCULAR | Status: AC
  Filled 2016-04-01: qty 1000

## 2016-04-01 MED ORDER — MIDAZOLAM HCL 2 MG/2ML IJ SOLN
INTRAMUSCULAR | Status: AC
  Filled 2016-04-01: qty 2

## 2016-04-01 MED ORDER — LIDOCAINE HCL (PF) 1 % IJ SOLN
INTRAMUSCULAR | Status: AC
  Filled 2016-04-01: qty 30

## 2016-04-01 MED ORDER — HYDROMORPHONE HCL 1 MG/ML IJ SOLN: 0.5000 mg | INTRAMUSCULAR | Status: DC | PRN

## 2016-04-01 MED ORDER — ACETAMINOPHEN 325 MG RE SUPP
325.0000 mg | RECTAL | Status: DC | PRN
  Filled 2016-04-01: qty 2

## 2016-04-01 MED ORDER — FENTANYL CITRATE (PF) 100 MCG/2ML IJ SOLN
INTRAMUSCULAR | Status: AC
  Filled 2016-04-01: qty 2

## 2016-04-01 MED ORDER — FENTANYL CITRATE (PF) 100 MCG/2ML IJ SOLN
INTRAMUSCULAR | Status: DC | PRN
  Administered 2016-04-01 (×4): 12.5 ug via INTRAVENOUS
  Administered 2016-04-01: 25 ug via INTRAVENOUS

## 2016-04-01 MED ORDER — SODIUM CHLORIDE 0.9 % IV SOLN: INTRAVENOUS | Status: DC

## 2016-04-01 MED ORDER — OXYCODONE HCL 5 MG PO TABS: 5.0000 mg | ORAL_TABLET | ORAL | Status: DC | PRN

## 2016-04-01 MED ORDER — ALUM & MAG HYDROXIDE-SIMETH 200-200-20 MG/5ML PO SUSP: 15.0000 mL | ORAL | Status: DC | PRN

## 2016-04-01 MED ORDER — ACETAMINOPHEN 325 MG PO TABS
ORAL_TABLET | ORAL | Status: AC
  Filled 2016-04-01: qty 2

## 2016-04-01 MED ORDER — HYDRALAZINE HCL 20 MG/ML IJ SOLN
5.0000 mg | INTRAMUSCULAR | Status: DC | PRN
  Administered 2016-04-01: 5 mg via INTRAVENOUS

## 2016-04-01 MED ORDER — DOCUSATE SODIUM 100 MG PO CAPS: 100.0000 mg | ORAL_CAPSULE | Freq: Every day | ORAL | Status: DC

## 2016-04-01 MED ORDER — ONDANSETRON HCL 4 MG/2ML IJ SOLN
4.0000 mg | Freq: Four times a day (QID) | INTRAMUSCULAR | Status: DC | PRN
Start: 2016-04-01 — End: 2016-04-02

## 2016-04-01 MED ORDER — PHENOL 1.4 % MT LIQD
1.0000 | OROMUCOSAL | Status: DC | PRN
  Filled 2016-04-01: qty 177

## 2016-04-01 MED ORDER — LIDOCAINE HCL (PF) 1 % IJ SOLN
INTRAMUSCULAR | Status: DC | PRN
  Administered 2016-04-01: 5 mL via SUBCUTANEOUS

## 2016-04-01 MED ORDER — ACETAMINOPHEN 325 MG PO TABS
325.0000 mg | ORAL_TABLET | ORAL | Status: DC | PRN
  Administered 2016-04-01: 650 mg via ORAL

## 2016-04-01 SURGICAL SUPPLY — 21 items
BALLN LUTONIX 5X150X130 (BALLOONS) ×6
BALLOON LUTONIX 5X150X130 (BALLOONS) IMPLANT
BUR JETSTREAM XC 2.1/3.0 (BURR) IMPLANT
BURR JETSTREAM XC 2.1/3.0 (BURR) ×3
CATH OMNI FLUSH 5F 65CM (CATHETERS) ×1 IMPLANT
COVER PRB 48X5XTLSCP FOLD TPE (BAG) IMPLANT
COVER PROBE 5X48 (BAG) ×3
DEVICE CONTINUOUS FLUSH (MISCELLANEOUS) ×1 IMPLANT
DEVICE EMBOSHIELD NAV6 4.0-7.0 (WIRE) ×1 IMPLANT
DRAPE ZERO GRAVITY STERILE (DRAPES) ×1 IMPLANT
KIT ENCORE 26 ADVANTAGE (KITS) ×1 IMPLANT
KIT MICROINTRODUCER STIFF 5F (SHEATH) ×1 IMPLANT
KIT PV (KITS) ×3 IMPLANT
SHEATH FLEXOR ANSEL 1 7F 45CM (SHEATH) ×1 IMPLANT
SHEATH PINNACLE 5F 10CM (SHEATH) ×1 IMPLANT
SYR MEDRAD MARK V 150ML (SYRINGE) ×3 IMPLANT
TRANSDUCER W/STOPCOCK (MISCELLANEOUS) ×3 IMPLANT
TRAY PV CATH (CUSTOM PROCEDURE TRAY) ×3 IMPLANT
WIRE BAREWIRE WORK .014X315CM (WIRE) ×1 IMPLANT
WIRE BENTSON .035X145CM (WIRE) ×1 IMPLANT
WIRE SPARTACORE .014X300CM (WIRE) ×1 IMPLANT

## 2016-04-01 NOTE — Op Note (Signed)
Patient name: [name] MRN: [id number] DOB: [birth year] Sex: female  04/01/2016 Pre-operative Diagnosis: In-stent stenosis, left leg Post-operative diagnosis:  Same Surgeon:  [name] Procedure Performed:  1.  Ultrasound-guided access, right femoral artery  2.  Abdominal aortogram  3.  Left lower extremity runoff  4.  Atherectomy, left superficial femoral and popliteal artery  5.  Drug coated balloon angioplasty, left superficial femoral and popliteal artery  6.  Conscious sedation (13:5-14:46)     Indications:  The patient is previously undergone both surgical and percutaneous revascularization of the left leg.  Her most recent ultrasound shows significant elevation in the velocity profile the superficial femoral artery stent as well as the native vessel.  She is here for further evaluation and possible intervention  Procedure:  The patient was identified in the holding area and taken to room 8.  The patient was then placed supine on the table and prepped and draped in the usual sterile fashion.  A time out was called.  Conscious sedation was performed with the use of IV fentanyl and Versed under continuous monitoring from myself, the attending physician as well as the circulating nurse.  We will present for the entire procedure.  Heart rate and blood pressure and oxygen saturations were monitored.  Ultrasound was used to evaluate the right common femoral artery.  It was patent .  A digital ultrasound image was acquired.  A micropuncture needle was used to access the right common femoral artery under ultrasound guidance.  An 018 wire was advanced without resistance and a micropuncture sheath was placed.  The 018 wire was removed and a benson wire was placed.  The micropuncture sheath was exchanged for a 5 french sheath.  An omniflush catheter was advanced over the wire to the level of L-1.  An abdominal angiogram was obtained.  Next, using the omniflush catheter and a benson wire,  the aortic bifurcation was crossed and the catheter was placed into theleft external iliac artery and left runoff was obtained.   Findings:   Aortogram:  No significant renal artery stenosis was identified.  Aneurysmal degeneration of the infrarenal abdominal aorta.  No significant stenotic lesions in the aortoiliac section  Right Lower Extremity:  Not evaluated  Left Lower Extremity:  The left common femoral profunda femoral artery are widely patent.  There is diffuse disease throughout the superficial femoral artery with several areas greater than 90%.  There is at least a 90% stenosis within the proximal superficial femoral artery stent.  There is a bypass graft visualized from the distal superficial femoral to the below-knee popliteal artery.  This is widely patent.  2 vessel runoff via the anterior tibial and peroneal artery.  Intervention:  After the above images were acquired the decision was made to proceed with intervention.  A 7 French sheath was advanced over the bifurcation into the left common femoral artery.  The patient was fully heparinized.  Using a Bare wire, the lesions were easily crossed.  A large NAV 6 filter was then placed below the knee.  I then selected the Jetstream 2.1 30 to perform atherectomy of the left superficial femoral-popliteal artery.  Because of the in-stent stenosis, this was done with the blades down.  The length of the lesion treated was approximately 25 cm.  After a successful pass was performed, a angiogram was performed which showed good results.  I therefore elected to treat this with drug coated balloon angioplasty.  2 overlapping Lutonix 5 x 1  50 balloon's were used to treat the entire length of the lesion.  Each balloon inflation was to nominal pressure of 7 atm and held for 2 minutes and 30 seconds.  Completion imaging showed excellent result with stenosis less than 10% within the superficial femoral-popliteal artery.  The below the knee images were obtained  and there appeared to be occlusion of the peroneal artery.  I then removed the filter.  I inspected the filter.  There was no debris within the filter.  I performed another arteriogram and the anterior tibial artery was widely patent.  There was a significant improvement in perfusion of the peroneal artery down to the lower leg.  I did not feel that further intervention would be beneficial and therefore elected to terminate the procedure.  Catheters and wires were removed.  The sheath was withdrawn to the right external iliac artery.  The patient was taken to the holding area for sheath pull once her coagulation profile corrects.  Impression:  #1  successful atherectomy and drug coated balloon angioplasty using the jetstream device and Lutonix 5 millimeter balloon  #2  3 intervention stenosis was greater than 90%.  Post was less than 10%   V. [name], M.D. [facility] Office: [phone] Pager:  [phone]

## 2016-04-01 NOTE — H&P (View-Only) (Signed)
[facility] [place] HISTORY AND PHYSICAL -PAD  History of Present Illness [name] is a 83 y.o. female patient of [name] who is status post left SFA to popliteal bypass graft that was done in 2010, she also had left SFA stent in June 2011 and 2012. She is also s/p stenting of left EIA and left SFA on 12/26/14.  On 10/30/15 [name] performed an arteriogram; findings and interventions were: Impression: #1 greater than 90% native superficial femoral artery stenosis on the left which was successfully treated using a [employer] 6 x 40 self-expanding stent with residual stenosis less than 5%. #2 balloon angioplasty of in-stent stenosis within the proximal above-knee popliteal anastomosis. Preintervention stenosis was greater than 60%, post was less than 10% #3 two-vessel runoff on the left leg #4 multiple high-grade near occlusive lesions within the right superficial femoral and popliteal artery   Daughter states [name] recommends treating the right leg arterial stenoses conservatively for as long as possible due to the prospect of restenosis.   She returns today for LE arterial evaluation. As best can be determined from pt's description, she did not have claudication symptoms before she fell October 2014, since her fall she has not walked much. Pt denies non healing wounds. Pt reports a couple of TIA's about 2000, none subsequently. Pt was receiving home physical therapy twice weekly, and does exercises on her own that she was taught.  Pt reports New Medical or Surgical History: Around Thanksgiving 2016 she developed bronchitis, treated by her PCP for this and feels improved.  [place] October 2014 and fractured left pelvis, had left elbow replacement, fractured left ankle and wrist.  She needs a left hip replacement but does not want to have that done at this point.  Her lower legs  cramping at rest is relieved by mustard by mouth.   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   Pt Diabetic: No Pt smoker: non-smoker  Pt meds include: Statin :No, statins cause aching Betablocker: Yes ASA: Yes Other anticoagulants/antiplatelets: Plavix     Past Medical History  Diagnosis Date  . Hypertension   . Stroke [facility])     History of TIA  . History of angina   . Peripheral vascular disease ([place])   . Fall Oct. 15, 2014    Fx. pelvis, Left Hip, Left Elbow  . DVT (deep venous thrombosis) ([place])   . Cancer [facility]) Aug. 2015    SCC - Right Lower Leg    Social History Social History  Substance Use Topics  . Smoking status: Former Smoker    Types: Cigarettes    Quit date: 11/17/1946  . Smokeless tobacco: Never Used  . Alcohol Use: No    Family History Family History  Problem Relation Age of Onset  . Heart disease Father     Heart Disease before age 80  . Hyperlipidemia Father   . Hypertension Father   . Alcohol abuse Father   . Hyperlipidemia Sister   . Hypertension Sister   . Heart disease Brother   . Hyperlipidemia Brother   . Hypertension Brother   . Deep vein thrombosis Brother   . Heart disease Son     Heart Disease before age 70  . Hyperlipidemia Son   . Hypertension Son   . Heart attack Son   . Diabetes Son   . Hypertension Son  Past Surgical History  Procedure Laterality Date  . Abdominal hysterectomy    . Pr vein bypass graft,aorto-fem-pop  09-13-09    Left Fem-pop  . Femoral artery stent  12-11-10    Left SFA  . Total elbow arthroplasty Left 09/03/2013    Procedure: LEFT TOTAL ELBOW ARTHROPLASTY;  Surgeon: Roseanne Kaufman, MD;  Location: [facility];  Service: Orthopedics;  Laterality: Left;  . Joint replacement      knee  . Joint replacement Left Oct. 17, 2014    Elbow ( pt fell 08-31-13 )  . Abdominal aortagram N/A 12/26/2014    Procedure: ABDOMINAL  Maxcine Ham;  Surgeon: Serafina Mitchell, MD;  Location[facility] CATH LAB;  Service: Cardiovascular;  Laterality: N/A;  . Insertion of iliac stent Left 12/26/2014    Procedure: INSERTION OF ILIAC STENT;  Surgeon: Serafina Mitchell, MD;  Location: [facility] CATH LAB;  Service: Cardiovascular;  Laterality: Left;  . Peripheral vascular catheterization N/A 10/30/2015    Procedure: Abdominal Aortogram w/Lower Extremity;  Surgeon: Serafina Mitchell, MD;  Location: [place] CV LAB;  Service: Cardiovascular;  Laterality: N/A;  . Peripheral vascular catheterization  10/30/2015    Procedure: Peripheral Vascular Intervention;  Surgeon: Serafina Mitchell, MD;  Location: [place] CV LAB;  Service: Cardiovascular;;    Allergies  Allergen Reactions  . Promethazine Hcl Other (See Comments)    IV  Drug only, unknown   . Sulfa Antibiotics Other (See Comments)    Unknown     Current Outpatient Prescriptions  Medication Sig Dispense Refill  . acetaminophen (TYLENOL) 500 MG tablet Take 1,000 mg by mouth every 6 (six) hours as needed for pain. Patient took this medication for her pain.    Marland Kitchen aspirin 81 MG tablet Take 81 mg by mouth daily.    . Cholecalciferol (VITAMIN D3) 2000 UNITS TABS Take 2,000 Units by mouth daily.    . clopidogrel (PLAVIX) 75 MG tablet Take 75 mg by mouth daily.    . diazepam (VALIUM) 5 MG tablet Take 5 mg by mouth every 6 (six) hours as needed for anxiety.    . metoprolol (LOPRESSOR) 50 MG tablet Take 25-50 mg by mouth 2 (two) times daily. Take 50mg  by mouth in the morning and take 25mg  by mouth at bedtime.    . Multiple Vitamin (MULTIVITAMIN) tablet Take 1 tablet by mouth daily.    . Multiple Vitamins-Minerals (EYE VITAMINS PO) Take 1 tablet by mouth daily.     . valsartan-hydrochlorothiazide (DIOVAN-HCT) 160-12.5 MG per tablet Take 0.5 tablets by mouth daily.    Marland Kitchen atorvastatin (LIPITOR) 10 MG tablet Take 1 tablet (10 mg total) by mouth daily. (Patient not taking: Reported on 12/07/2015) 30 tablet 0    No current facility-administered medications for this visit.    ROS: See HPI for pertinent positives and negatives.   Physical Examination  Filed Vitals:   03/19/16 1522 03/19/16 1525  BP: 185/83 171/77  Pulse: 68   Height: 5\' 2"  (1.575 m)   Weight: 129 lb 3.2 oz (58.605 kg)   SpO2: 100%    Body mass index is 23.63 kg/(m^2).  General: A&O x 3, WDWN  Gait: using walker, slow and deliberate Eyes: Pupils equal Pulmonary: CTAB, no wheezes ,rhonchi, or rales.  Cardiac: regular rhythm, no detected murmur     Carotid Bruits Left Right   positive Negative  Aorta: is not palpable Radial pulses: are 2+ palpable and =   VASCULAR EXAM: Extremities without ischemic changes,  without Gangrene;  without open wounds.     LE Pulses LEFT RIGHT   FEMORAL 1+ palpable  1+ palpable    POPLITEAL not palpable  not palpable   POSTERIOR TIBIAL  not palpable   Not palpable    DORSALIS PEDIS  ANTERIOR TIBIAL not palpable  not palpable    Abdomen: soft, NT, no palpable masses. Skin: no rashes, no ulcers. Musculoskeletal: no muscle wasting or atrophy. Neurologic: A&O X 3; Appropriate Affect; MOTOR FUNCTION: moving all extremities equally, motor strength 3/5 throughout. Speech is fluent/normal. CN 2-12 intact.                      Non-Invasive Vascular Imaging: DATE: 03/19/2016  CEREBROVASCULAR DUPLEX EVALUATION    INDICATION: Left carotid bruit    PREVIOUS INTERVENTION(S): None    DUPLEX EXAM: Carotid duplex    RIGHT  LEFT  Peak Systolic Velocities (cm/s) End Diastolic Velocities (cm/s) Plaque LOCATION Peak Systolic Velocities (cm/s) End Diastolic Velocities (cm/s) Plaque  79 11   CCA PROXIMAL 74 14 HT  88 14  CCA MID 87 15 HT  83 16 HT CCA DISTAL 82 16 HT  101 6 HT ECA 83 8   141 30 HT ICA PROXIMAL 69 17 HT  118 19  ICA MID 71 19   86 17  ICA DISTAL 100 17 HT    1.6 ICA / CCA Ratio (PSV) .79  Antegrade Vertebral Flow Antegrade  - Brachial Systolic Pressure (mmHg) -  Triphasic Brachial Artery Waveforms Triphasic    Plaque Morphology:  HM = Homogeneous, HT = Heterogeneous, CP = Calcific Plaque, SP = Smooth Plaque, IP = Irregular Plaque     ADDITIONAL FINDINGS: Multiphasic subclavian arteries, plaque noted in the right subclavian artery    IMPRESSION: 1. Less than 40% bilateral internal carotid artery stenosis    Compared to the previous exam:  No prior exam    LOWER EXTREMITY ARTERIAL EVALUATION    INDICATION: Periphereal vascular disease    PREVIOUS INTERVENTION(S): Left EIA, left SFA angioplasty 12/26/14; Stent in left SFA, angioplasty in-stent stenosis left popliteal artery 10/30/15    DUPLEX EXAM:     RIGHT  LEFT   Peak Systolic Velocity (cm/s) Ratio (if abnormal) Waveform  Peak Systolic Velocity (cm/s) Ratio (if abnormal) Waveform     Artery - Proximal to Stent 27  M     Stent - Origin 589 16.8 M     Stent - Proximal 437 12.4 M     Stent - Mid 40  M     Stent - Distal 24  M      Stent - End 17  M     Artery - Distal to Stent 85  M  0.55 Today's ABI / TBI 0.40  0.56 Previous ABI / TBI (  12/03/15) 0.90    Waveform:    M - Monophasic       B - Biphasic       T - Triphasic  If Ankle Brachial Index (ABI) or Toe Brachial Index (TBI) performed, please see complete report     ADDITIONAL FINDINGS:     IMPRESSION: 1. 50 - 74% left proximal  native superficial femoral artery stenosis 2. Greater than 75% stenosis in the left proximal thigh stent at origin and proximal stent. 3. The distal left SFA to popliteal stent appears patent with no stenosis visualized    Compared to the previous exam:  Increased velocity on the left since prior exam  ASSESSMENT: [name] is a 82 y.o. female who is s/p left distal superficial femoral to popliteal artery bypass graft 09/13/2009, left superficial femoral artery stents placed 04/25/2010 &12/11/2010; left external iliac artery stent, left superficial femoral artery angioplasty 12/26/2014, angioplasty of in-stent stenosis within the proximal above-knee popliteal anastomosis and stent placement in left SFA on 10/30/15 . She has night legs cramps relieved by po mustard. She c/o night time right foot cramping, but does no c/o claudication sx's with walking, but states her legs hurt all the time. 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   Today's left LE arterial duplex indicates 50 - 74% left proximal  native superficial femoral artery stenosis. Greater than 75% stenosis in the left proximal thigh stent at origin (589 cm/s) and proximal (437 cm/s) stent. The distal left SFA to popliteal stent appears patent with no stenosis visualize.  See Plan.  Left carotid bruit, no TIA since 2000. Today's carotid duplex suggests less than 40% bilateral internal carotid artery stenosis.   PLAN:  Based on the patient's vascular studies and examination, and after discussing with Dr. Scot Dock, pt will be scheduled for arteriogram with bilateral run off, possible intervention, for 589 and 437 cm/s velocities in left SFA stent, drop in ABI's from 90% (triphasic waveforms) to 40% (monophasic waveforms) in 4 months. Her creatinine in December 2016 was 1.0.  I discussed in depth with the patient the nature of atherosclerosis, and emphasized the importance of maximal medical management including strict control of blood pressure, blood glucose, and lipid levels, obtaining regular exercise, and continued cessation of smoking.  The patient is aware that without maximal medical management the underlying atherosclerotic  disease process will progress, limiting the benefit of any interventions.  The patient was given information about PAD including signs, symptoms, treatment, what symptoms should prompt the patient to seek immediate medical care, and risk reduction measures to take.  Clemon Chambers, RN, MSN, FNP-C [facility] Phone: [phone]  Clinic MD: Scot Dock  03/19/2016 3:52 PM

## 2016-04-01 NOTE — Progress Notes (Signed)
Site area: Right groin a 7 french arterial sheath was removed  Site Prior to Removal:  Level 0  Pressure Applied For 15 MINUTES   Bedrest Beginning at 1735p  Manual:   Yes.    Patient Status During Pull:  stable  Post Pull Groin Site:  Level 0  Post Pull Instructions Given:  Yes.    Post Pull Pulses Present:  Yes.    Dressing Applied:  Yes.    Comments:  VS remain stable during sheath pull

## 2016-04-01 NOTE — Interval H&P Note (Signed)
History and Physical Interval Note:  04/01/2016 12:23 PM  [name]  has presented today for surgery, with the diagnosis of left sfa stenosis  The various methods of treatment have been discussed with the patient and family. After consideration of risks, benefits and other options for treatment, the patient has consented to  Procedure(s): Abdominal Aortogram w/Lower Extremity (N/A) as a surgical intervention .  The patient's history has been reviewed, patient examined, no change in status, stable for surgery.  I have reviewed the patient's chart and labs.  Questions were answered to the patient's satisfaction.     [name]

## 2016-04-01 NOTE — Progress Notes (Signed)
Pt with discharge order. V/S stable. Walked pt approx. 0000000 ft without complications. Right femoral site remains level"0". Discharge instructions -meds, Dr's appt, groin site care instructions provided. IV removed. Family at bedside.Discharged pt.

## 2016-04-02 ENCOUNTER — Encounter (HOSPITAL_COMMUNITY): Payer: Self-pay | Admitting: Surgery

## 2016-04-02 ENCOUNTER — Other Ambulatory Visit: Payer: Self-pay | Admitting: *Deleted

## 2016-04-08 ENCOUNTER — Telehealth: Payer: Self-pay | Admitting: Surgery

## 2016-04-08 NOTE — Telephone Encounter (Signed)
Sched appt 8/14; labs at 1 and MD at 2:15. Spoke to pt to inform them of appt.

## 2016-04-08 NOTE — Telephone Encounter (Signed)
-----   Message from Mena Goes, RN sent at 04/01/2016  3:33 PM EDT ----- Regarding: schedule   ----- Message -----    From: Serafina Mitchell, MD    Sent: 04/01/2016   3:01 PM      To: [facility] Charge Pool  04/01/2016:  Surgeon:  [name] Procedure Performed:  1.  Ultrasound-guided access, right femoral artery  2.  Abdominal aortogram  3.  Left lower extremity runoff  4.  Atherectomy, left superficial femoral and popliteal artery  5.  Drug coated balloon angioplasty, left superficial femoral and popliteal artery  6.  Conscious sedation (13:514:46)    Follow-up in 3 months with a duplex of the left leg and ABI

## 2016-05-22 DIAGNOSIS — H04123 Dry eye syndrome of bilateral lacrimal glands: Secondary | ICD-10-CM | POA: Diagnosis not present

## 2016-05-22 DIAGNOSIS — Z961 Presence of intraocular lens: Secondary | ICD-10-CM | POA: Diagnosis not present

## 2016-05-22 DIAGNOSIS — H52202 Unspecified astigmatism, left eye: Secondary | ICD-10-CM | POA: Diagnosis not present

## 2016-05-22 DIAGNOSIS — H35341 Macular cyst, hole, or pseudohole, right eye: Secondary | ICD-10-CM | POA: Diagnosis not present

## 2016-06-26 ENCOUNTER — Encounter: Payer: Self-pay | Admitting: Surgery

## 2016-06-30 ENCOUNTER — Ambulatory Visit (INDEPENDENT_AMBULATORY_CARE_PROVIDER_SITE_OTHER)
Admission: RE | Admit: 2016-06-30 | Discharge: 2016-06-30 | Disposition: A | Discharge status: Home / Self Care | Payer: Medicare Other | Source: Ambulatory Visit | Attending: Surgery | Admitting: Surgery

## 2016-06-30 ENCOUNTER — Encounter: Payer: Self-pay | Admitting: Surgery

## 2016-06-30 ENCOUNTER — Ambulatory Visit (HOSPITAL_COMMUNITY)
Admission: RE | Admit: 2016-06-30 | Discharge: 2016-06-30 | Disposition: A | Discharge status: Home / Self Care | Payer: Medicare Other | Source: Ambulatory Visit | Attending: Surgery | Admitting: Surgery

## 2016-06-30 ENCOUNTER — Encounter (HOSPITAL_COMMUNITY): Payer: Self-pay | Admitting: *Deleted

## 2016-06-30 ENCOUNTER — Ambulatory Visit (INDEPENDENT_AMBULATORY_CARE_PROVIDER_SITE_OTHER): Payer: Medicare Other | Admitting: Surgery

## 2016-06-30 VITALS — BP 182/77 | HR 60 | Ht 62.0 in | Wt 131.0 lb

## 2016-06-30 DIAGNOSIS — Z959 Presence of cardiac and vascular implant and graft, unspecified: Secondary | ICD-10-CM

## 2016-06-30 DIAGNOSIS — T82856D Stenosis of peripheral vascular stent, subsequent encounter: Secondary | ICD-10-CM

## 2016-06-30 DIAGNOSIS — I739 Peripheral vascular disease, unspecified: Secondary | ICD-10-CM

## 2016-06-30 DIAGNOSIS — I10 Essential (primary) hypertension: Secondary | ICD-10-CM | POA: Insufficient documentation

## 2016-06-30 DIAGNOSIS — R0989 Other specified symptoms and signs involving the circulatory and respiratory systems: Secondary | ICD-10-CM | POA: Diagnosis present

## 2016-06-30 DIAGNOSIS — R938 Abnormal findings on diagnostic imaging of other specified body structures: Secondary | ICD-10-CM | POA: Diagnosis not present

## 2016-06-30 DIAGNOSIS — Z95820 Peripheral vascular angioplasty status with implants and grafts: Secondary | ICD-10-CM

## 2016-06-30 NOTE — Progress Notes (Signed)
[facility] [place]  Patient name: [name] MRN: [id number] DOB: 1943/07/12 Sex: female  REASON FOR VISIT: follow up  HPI: The patient is back today for followup. She initially presented in October 2010 with ischemic changes to her left foot. She underwent distal left superficial femoral to below knee popliteal artery bypass graft with reversed ipsilateral greater saphenous vein she did have issues with wound healing which ultimately resolved. She developed a high-grade stenosis within the popliteal artery above the proximal anastomosis which was stented in 2011. She was also found to have an elevated velocity and on 12/11/2010 and in-stent stenosis was re\re stented using a 6 x 30 EV3 stents.   She recently had an ultrasound that showed native superficial femoral artery stenosis proximal to her stents.  Therefore on 01/05/2015 she underwent angiography.  At that time, she had stenting of her left external iliac stenosis with a 7 x 30 self-expanding stent as well as stenting of her left superficial femoral artery using a 6 x 30 self-expanding stent.  The lesion in the superficial femoral artery was initially treated with a drug coated balloon angioplasty which was complicated by non-flow limiting dissection requiring stenting the bypass graft was found to be widely patent with two-vessel runoff via the peroneal and anterior tibial artery.  She developed velocity elevations again and in December 2016 underwent angiography and stenting as well as in-stent stenosis angioplasty of her left superficial femoral-popliteal artery.  She again had velocity elevations recently and on 04/01/2016 I performed atherectomy with a jetstream device and drug coated balloon angioplasty. She has difficulty with walking.  This likely stems from her hip.  Hip replacement surgery has been recommended.  She denies nonhealing wounds although she does have pain  in her right foot at the first metatarsal head.   Past Medical History:  Diagnosis Date  . Cancer ([place]) Aug. 2015   SCC - Right Lower Leg  . DVT (deep venous thrombosis) ([place])   . Fall Oct. 15, 2014   Fx. pelvis, Left Hip, Left Elbow  . History of angina   . Hypertension   . Peripheral vascular disease ([place])   . Stroke [facility])    History of TIA    Family History  Problem Relation Age of Onset  . Heart disease Father     Heart Disease before age 48  . Hyperlipidemia Father   . Hypertension Father   . Alcohol abuse Father   . Heart disease Brother   . Hyperlipidemia Brother   . Hypertension Brother   . Deep vein thrombosis Brother   . Heart disease Son     Heart Disease before age 76  . Hyperlipidemia Son   . Hypertension Son   . Heart attack Son   . Diabetes Son   . Hypertension Son   . Hyperlipidemia Sister   . Hypertension Sister     SOCIAL HISTORY: Social History  Substance Use Topics  . Smoking status: Former Smoker    Types: Cigarettes    Quit date: 11/17/1946  . Smokeless tobacco: Never Used  . Alcohol use No    Allergies  Allergen Reactions  . Motrin [Ibuprofen] Other (See Comments)    GI bleeding  . Statins Other (See Comments)    Pain and muscle weakness  . Promethazine Hcl Other (See Comments)    IV  Drug only, makes her act crazy  . Sulfa Antibiotics Other (See Comments)    Unknown  Current Outpatient Prescriptions  Medication Sig Dispense Refill  . acetaminophen (TYLENOL) 500 MG tablet Take 1,000 mg by mouth 2 (two) times daily as needed for moderate pain or headache. Patient took this medication for her pain.    Marland Kitchen aspirin 81 MG tablet Take 81 mg by mouth daily.    . Cholecalciferol (VITAMIN D3) 2000 UNITS TABS Take 2,000 Units by mouth daily.    . clopidogrel (PLAVIX) 75 MG tablet Take 75 mg by mouth daily.    . diazepam (VALIUM) 5 MG tablet Take 2.5 mg by mouth at bedtime as needed for anxiety.     . metoprolol (LOPRESSOR) 50 MG tablet  Take 25-50 mg by mouth 2 (two) times daily. Take 50mg  by mouth in the morning and take 25mg  by mouth at bedtime.    . Multiple Vitamins-Minerals (EYE VITAMINS PO) Take 1 tablet by mouth daily.     . valsartan-hydrochlorothiazide (DIOVAN-HCT) 160-12.5 MG per tablet Take 0.5 tablets by mouth daily.     No current facility-administered medications for this visit.     REVIEW OF SYSTEMS:  [X]  denotes positive finding, [ ]  denotes negative finding Cardiac  Comments:  Chest pain or chest pressure: x   Shortness of breath upon exertion: x   Short of breath when lying flat:    Irregular heart rhythm:        Vascular    Pain in calf, thigh, or hip brought on by ambulation: x   Pain in feet at night that wakes you up from your sleep:  x   Blood clot in your veins: x   Leg swelling:  x       Pulmonary    Oxygen at home:    Productive cough:     Wheezing:         Neurologic    Sudden weakness in arms or legs:     Sudden numbness in arms or legs:  x   Sudden onset of difficulty speaking or slurred speech:    Temporary loss of vision in one eye:     Problems with dizziness:         Gastrointestinal    Blood in stool:     Vomited blood:         Genitourinary    Burning when urinating:     Blood in urine:        Psychiatric    Major depression:         Hematologic    Bleeding problems:    Problems with blood clotting too easily:        Skin    Rashes or ulcers:        Constitutional    Fever or chills:      PHYSICAL EXAM: Vitals:   06/30/16 1426 06/30/16 1427  BP: (!) 180/78 (!) 182/77  Pulse: 60   SpO2: (!) 60%   Weight: 131 lb (59.4 kg)   Height: 5\' 2"  (1.575 m)     GENERAL: The patient is a well-nourished female, in no acute distress. The vital signs are documented above. CARDIAC: There is a regular rate and rhythm.  VASCULAR: Palpable left dorsalis pedis pulse  PULMONARY: There is good air exchange bilaterally without wheezing or rales.  MUSCULOSKELETAL: There  are no major deformities or cyanosis. NEUROLOGIC: No focal weakness or paresthesias are detected. SKIN: There are no ulcers or rashes noted. PSYCHIATRIC: The patient has a normal affect.  DATA:  I have reviewed her ultrasound.  ABI is 0.92 on the left with triphasic waveforms.  On the right ABI 0.22 with monophasic waveforms.  Duplex of the left leg shows widely patent intervention with no significant stenosis  MEDICAL ISSUES: The patient will follow up in 6 months with repeat ABIs and duplex.  I discussed her risk for recurrent stenosis.  The patient does have pain in her right foot at her metatarsal head.  Because of the focal nature, I doubt that this is rest pain, however with her significantly limited blood flow to the right foot this could be vasculopathic in origin.  Her angiogram from December 2016 reveals multiple diffuse lesions throughout the superficial femoral artery on the right.  I told the patient to have this further evaluated by her primary care physician to make sure this is not gout or arthritis.  If she develops a wound, she would need to undergo angiography.  I would not recommend angiography for her current symptoms.   [name], MD [facility] [phone] Pager [phone]

## 2016-07-16 ENCOUNTER — Other Ambulatory Visit: Payer: Self-pay | Admitting: Surgery

## 2016-07-16 DIAGNOSIS — I739 Peripheral vascular disease, unspecified: Secondary | ICD-10-CM

## 2016-07-16 DIAGNOSIS — I82409 Acute embolism and thrombosis of unspecified deep veins of unspecified lower extremity: Secondary | ICD-10-CM

## 2016-07-30 DIAGNOSIS — G629 Polyneuropathy, unspecified: Secondary | ICD-10-CM | POA: Diagnosis not present

## 2016-07-30 DIAGNOSIS — G4700 Insomnia, unspecified: Secondary | ICD-10-CM | POA: Diagnosis not present

## 2016-07-30 DIAGNOSIS — Z23 Encounter for immunization: Secondary | ICD-10-CM | POA: Diagnosis not present

## 2016-07-30 DIAGNOSIS — M169 Osteoarthritis of hip, unspecified: Secondary | ICD-10-CM | POA: Diagnosis not present

## 2016-07-30 DIAGNOSIS — E538 Deficiency of other specified B group vitamins: Secondary | ICD-10-CM | POA: Diagnosis not present

## 2016-08-28 ENCOUNTER — Other Ambulatory Visit: Payer: Self-pay | Admitting: Internal Medicine

## 2016-08-28 ENCOUNTER — Ambulatory Visit
Admission: RE | Admit: 2016-08-28 | Discharge: 2016-08-28 | Disposition: A | Discharge status: Home / Self Care | Payer: Medicare Other | Source: Ambulatory Visit | Attending: Internal Medicine | Admitting: Internal Medicine

## 2016-08-28 DIAGNOSIS — R5382 Chronic fatigue, unspecified: Secondary | ICD-10-CM | POA: Diagnosis not present

## 2016-08-28 DIAGNOSIS — I10 Essential (primary) hypertension: Secondary | ICD-10-CM | POA: Diagnosis not present

## 2016-08-28 DIAGNOSIS — I739 Peripheral vascular disease, unspecified: Secondary | ICD-10-CM | POA: Diagnosis not present

## 2016-08-28 DIAGNOSIS — K219 Gastro-esophageal reflux disease without esophagitis: Secondary | ICD-10-CM | POA: Diagnosis not present

## 2016-08-28 DIAGNOSIS — E559 Vitamin D deficiency, unspecified: Secondary | ICD-10-CM | POA: Diagnosis not present

## 2016-08-28 DIAGNOSIS — M79674 Pain in right toe(s): Secondary | ICD-10-CM

## 2016-08-28 DIAGNOSIS — Z79899 Other long term (current) drug therapy: Secondary | ICD-10-CM | POA: Diagnosis not present

## 2016-08-28 DIAGNOSIS — Z139 Encounter for screening, unspecified: Secondary | ICD-10-CM | POA: Diagnosis not present

## 2016-08-28 DIAGNOSIS — G629 Polyneuropathy, unspecified: Secondary | ICD-10-CM | POA: Diagnosis not present

## 2016-08-28 DIAGNOSIS — M169 Osteoarthritis of hip, unspecified: Secondary | ICD-10-CM | POA: Diagnosis not present

## 2016-08-28 DIAGNOSIS — E538 Deficiency of other specified B group vitamins: Secondary | ICD-10-CM | POA: Diagnosis not present

## 2016-08-28 DIAGNOSIS — Z1389 Encounter for screening for other disorder: Secondary | ICD-10-CM | POA: Diagnosis not present

## 2016-08-28 DIAGNOSIS — Z0001 Encounter for general adult medical examination with abnormal findings: Secondary | ICD-10-CM | POA: Diagnosis not present

## 2016-08-28 DIAGNOSIS — G4700 Insomnia, unspecified: Secondary | ICD-10-CM | POA: Diagnosis not present

## 2016-08-28 DIAGNOSIS — E7800 Pure hypercholesterolemia, unspecified: Secondary | ICD-10-CM | POA: Diagnosis not present

## 2016-08-29 ENCOUNTER — Encounter: Payer: Self-pay | Admitting: Family

## 2016-08-29 ENCOUNTER — Telehealth: Payer: Self-pay | Admitting: *Deleted

## 2016-08-29 NOTE — Telephone Encounter (Signed)
Returned call to patient.  She was c/o increased pain to both legs and on the third toe of her right foot she has an ulcer.  The patient stated that she had seen her PCP 08/28/16 and he had this toe x-rayed.  Patient is c/o numbness in both feet.

## 2016-09-01 ENCOUNTER — Ambulatory Visit (INDEPENDENT_AMBULATORY_CARE_PROVIDER_SITE_OTHER): Payer: Medicare Other | Admitting: Family

## 2016-09-01 ENCOUNTER — Ambulatory Visit (HOSPITAL_COMMUNITY)
Admission: RE | Admit: 2016-09-01 | Discharge: 2016-09-01 | Disposition: A | Discharge status: Home / Self Care | Payer: Medicare Other | Source: Ambulatory Visit | Attending: Family | Admitting: Family

## 2016-09-01 ENCOUNTER — Ambulatory Visit (HOSPITAL_COMMUNITY): Admission: RE | Admit: 2016-09-01 | Payer: Medicare Other | Source: Ambulatory Visit

## 2016-09-01 ENCOUNTER — Ambulatory Visit (INDEPENDENT_AMBULATORY_CARE_PROVIDER_SITE_OTHER)
Admission: RE | Admit: 2016-09-01 | Discharge: 2016-09-01 | Disposition: A | Discharge status: Home / Self Care | Payer: Medicare Other | Source: Ambulatory Visit

## 2016-09-01 ENCOUNTER — Encounter: Payer: Self-pay | Admitting: Family

## 2016-09-01 VITALS — BP 159/76 | HR 66 | Temp 97.6°F | Resp 14 | Ht 62.0 in | Wt 130.0 lb

## 2016-09-01 DIAGNOSIS — I779 Disorder of arteries and arterioles, unspecified: Secondary | ICD-10-CM | POA: Diagnosis not present

## 2016-09-01 DIAGNOSIS — Z95828 Presence of other vascular implants and grafts: Secondary | ICD-10-CM

## 2016-09-01 DIAGNOSIS — T82856D Stenosis of peripheral vascular stent, subsequent encounter: Secondary | ICD-10-CM

## 2016-09-01 DIAGNOSIS — I70229 Atherosclerosis of native arteries of extremities with rest pain, unspecified extremity: Secondary | ICD-10-CM

## 2016-09-01 DIAGNOSIS — M79605 Pain in left leg: Secondary | ICD-10-CM

## 2016-09-01 DIAGNOSIS — I6523 Occlusion and stenosis of bilateral carotid arteries: Secondary | ICD-10-CM

## 2016-09-01 DIAGNOSIS — Z8739 Personal history of other diseases of the musculoskeletal system and connective tissue: Secondary | ICD-10-CM

## 2016-09-01 NOTE — Patient Instructions (Signed)
Peripheral Vascular Disease Peripheral vascular disease (PVD) is a disease of the blood vessels that are not part of your heart and brain. A simple term for PVD is poor circulation. In most cases, PVD narrows the blood vessels that carry blood from your heart to the rest of your body. This can result in a decreased supply of blood to your arms, legs, and internal organs, like your stomach or kidneys. However, it most often affects a person's lower legs and feet. There are two types of PVD.  Organic PVD. This is the more common type. It is caused by damage to the structure of blood vessels.  Functional PVD. This is caused by conditions that make blood vessels contract and tighten (spasm). Without treatment, PVD tends to get worse over time. PVD can also lead to acute ischemic limb. This is when an arm or limb suddenly has trouble getting enough blood. This is a medical emergency. CAUSES Each type of PVD has many different causes. The most common cause of PVD is buildup of a fatty material (plaque) inside of your arteries (atherosclerosis). Small amounts of plaque can break off from the walls of the blood vessels and become lodged in a smaller artery. This blocks blood flow and can cause acute ischemic limb. Other common causes of PVD include:  Blood clots that form inside of blood vessels.  Injuries to blood vessels.  Diseases that cause inflammation of blood vessels or cause blood vessel spasms.  Health behaviors and health history that increase your risk of developing PVD. RISK FACTORS  You may have a greater risk of PVD if you:  Have a family history of PVD.  Have certain medical conditions, including:  High cholesterol.  Diabetes.  High blood pressure (hypertension).  Coronary heart disease.  Past problems with blood clots.  Past injury, such as burns or a broken bone. These may have damaged blood vessels in your limbs.  Buerger disease. This is caused by inflamed blood  vessels in your hands and feet.  Some forms of arthritis.  Rare birth defects that affect the arteries in your legs.  Use tobacco.  Do not get enough exercise.  Are obese.  Are age 50 or older. SIGNS AND SYMPTOMS  PVD may cause many different symptoms. Your symptoms depend on what part of your body is not getting enough blood. Some common signs and symptoms include:  Cramps in your lower legs. This may be a symptom of poor leg circulation (claudication).  Pain and weakness in your legs while you are physically active that goes away when you rest (intermittent claudication).  Leg pain when at rest.  Leg numbness, tingling, or weakness.  Coldness in a leg or foot, especially when compared with the other leg.  Skin or hair changes. These can include:  Hair loss.  Shiny skin.  Pale or bluish skin.  Thick toenails.  Inability to get or maintain an erection (erectile dysfunction). People with PVD are more prone to developing ulcers and sores on their toes, feet, or legs. These may take longer than normal to heal. DIAGNOSIS Your health care provider may diagnose PVD from your signs and symptoms. The health care provider will also do a physical exam. You may have tests to find out what is causing your PVD and determine its severity. Tests may include:  Blood pressure recordings from your arms and legs and measurements of the strength of your pulses (pulse volume recordings).  Imaging studies using sound waves to take pictures of   the blood flow through your blood vessels (Doppler ultrasound).  Injecting a dye into your blood vessels before having imaging studies using:  X-rays (angiogram or arteriogram).  Computer-generated X-rays (CT angiogram).  A powerful electromagnetic field and a computer (magnetic resonance angiogram or MRA). TREATMENT Treatment for PVD depends on the cause of your condition and the severity of your symptoms. It also depends on your age. Underlying  causes need to be treated and controlled. These include long-lasting (chronic) conditions, such as diabetes, high cholesterol, and high blood pressure. You may need to first try making lifestyle changes and taking medicines. Surgery may be needed if these do not work. Lifestyle changes may include:  Quitting smoking.  Exercising regularly.  Following a low-fat, low-cholesterol diet. Medicines may include:  Blood thinners to prevent blood clots.  Medicines to improve blood flow.  Medicines to improve your blood cholesterol levels. Surgical procedures may include:  A procedure that uses an inflated balloon to open a blocked artery and improve blood flow (angioplasty).  A procedure to put in a tube (stent) to keep a blocked artery open (stent implant).  Surgery to reroute blood flow around a blocked artery (peripheral bypass surgery).  Surgery to remove dead tissue from an infected wound on the affected limb.  Amputation. This is surgical removal of the affected limb. This may be necessary in cases of acute ischemic limb that are not improved through medical or surgical treatments. HOME CARE INSTRUCTIONS  Take medicines only as directed by your health care provider.  Do not use any tobacco products, including cigarettes, chewing tobacco, or electronic cigarettes. If you need help quitting, ask your health care provider.  Lose weight if you are overweight, and maintain a healthy weight as directed by your health care provider.  Eat a diet that is low in fat and cholesterol. If you need help, ask your health care provider.  Exercise regularly. Ask your health care provider to suggest some good activities for you.  Use compression stockings or other mechanical devices as directed by your health care provider.  Take good care of your feet.  Wear comfortable shoes that fit well.  Check your feet often for any cuts or sores. SEEK MEDICAL CARE IF:  You have cramps in your legs  while walking.  You have leg pain when you are at rest.  You have coldness in a leg or foot.  Your skin changes.  You have erectile dysfunction.  You have cuts or sores on your feet that are not healing. SEEK IMMEDIATE MEDICAL CARE IF:  Your arm or leg turns cold and blue.  Your arms or legs become red, warm, swollen, painful, or numb.  You have chest pain or trouble breathing.  You suddenly have weakness in your face, arm, or leg.  You become very confused or lose the ability to speak.  You suddenly have a very bad headache or lose your vision.   This information is not intended to replace advice given to you by your health care provider. Make sure you discuss any questions you have with your health care provider.   Document Released: 12/11/2004 Document Revised: 11/24/2014 Document Reviewed: 04/13/2014 Elsevier Interactive Patient Education 2016 [employer]  

## 2016-09-01 NOTE — Progress Notes (Signed)
[facility] VEIN SPECIALISTS OF [place]   CC: Follow up peripheral artery occlusive disease  History of Present Illness [name] is a 82 y.o. female patient of [name] who initially presented in October 2010 with ischemic changes to her left foot. She underwent distal left superficial femoral to below knee popliteal artery bypass graft with reversed ipsilateral greater saphenous vein she did have issues with wound healing which ultimately resolved. She developed a high-grade stenosis within the popliteal artery above the proximal anastomosis which was stented in 2011. She was also found to have an elevated velocity and on 12/11/2010 and in-stent stenosis was re\re stented using a 6 x 30 EV3 stents.   She then had an ultrasound that showed native superficial femoral artery stenosis proximal to her stents. Therefore on 01/05/2015 she underwent angiography. At that time, she had stenting of her left external iliac stenosis with a 7 x 30 self-expanding stent as well as stenting of her left superficial femoral artery using a 6 x 30 self-expanding stent. The lesion in the superficial femoral artery was initially treated with a drug coated balloon angioplasty which was complicated by non-flow limiting dissection requiring stenting the bypass graft was found to be widely patent with two-vessel runoff via the peroneal and anterior tibial artery.  She developed velocity elevations again and in December 2016 underwent angiography and stenting as well as in-stent stenosis angioplasty of her left superficial femoral-popliteal artery.  She again had velocity elevations recently and on 04/01/2016 [name] performed atherectomy with a jetstream device and drug coated balloon angioplasty. She has difficulty with walking.  This likely stems from her hip.  Hip replacement surgery has been recommended.    She returns today with c/o increased pain to both lower legs and on the third toe of her right  foot there is a dark spot since she dropped something on her toe about a month ago; there was an open wound at the dorsal aspect of her right 3rd toe MIP joint, but this healed after her PCP obtained a culture of this on 08/28/16.  The patient stated that she had seen her PCP 08/28/16 and he had this toe x-rayed.  Patient is c/o numbness in both feet.  A red area developed recently at the left mid calf, medial aspect.  Both feet are tender to touch. Both calves and feet are numb.  Pain in feet and calves keep her awake at night. Her PCP prescribed tramadol and gabapentin which have helped a little.  Around Thanksgiving 2016 she developed bronchitis, treated by her PCP for this and feels improved.  [place] October 2014 and fractured left pelvis, had left elbow replacement, fractured left ankle and wrist.  She needs a left hip replacement but does not want to have that done at this point.  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   Pt Diabetic: No Pt smoker: non-smoker  Pt meds include: Statin :No, statins cause aching Betablocker: Yes ASA: Yes Other anticoagulants/antiplatelets: Plavix    Past Medical History:  Diagnosis Date  . Cancer ([place]) Aug. 2015   SCC - Right Lower Leg  . DVT (deep venous thrombosis) ([place])   . Fall Oct. 15, 2014   Fx. pelvis, Left Hip, Left Elbow  . History of angina   . Hypertension   . Peripheral vascular disease ([place])   . Stroke [facility])    History of TIA  Social History Social History  Substance Use Topics  . Smoking status: Former Smoker    Types: Cigarettes    Quit date: 11/17/1946  . Smokeless tobacco: Never Used  . Alcohol use No    Family History Family History  Problem Relation Age of Onset  . Heart disease Father     Heart Disease before age 63  . Hyperlipidemia Father   . Hypertension Father   . Alcohol abuse Father   . Heart disease  Brother   . Hyperlipidemia Brother   . Hypertension Brother   . Deep vein thrombosis Brother   . Heart disease Son     Heart Disease before age 64  . Hyperlipidemia Son   . Hypertension Son   . Heart attack Son   . Diabetes Son   . Hypertension Son   . Hyperlipidemia Sister   . Hypertension Sister     Past Surgical History:  Procedure Laterality Date  . ABDOMINAL AORTAGRAM N/A 12/26/2014   Procedure: ABDOMINAL Maxcine Ham;  Surgeon: Serafina Mitchell, MD;  Location: [facility] CATH LAB;  Service: Cardiovascular;  Laterality: N/A;  . ABDOMINAL HYSTERECTOMY    . FEMORAL ARTERY STENT  12-11-10   Left SFA  . INSERTION OF ILIAC STENT Left 12/26/2014   Procedure: INSERTION OF ILIAC STENT;  Surgeon: Serafina Mitchell, MD;  Location: [facility] CATH LAB;  Service: Cardiovascular;  Laterality: Left;  . JOINT REPLACEMENT     knee  . JOINT REPLACEMENT Left Oct. 17, 2014   Elbow ( pt fell 08-31-13 )  . PERIPHERAL VASCULAR CATHETERIZATION N/A 10/30/2015   Procedure: Abdominal Aortogram w/Lower Extremity;  Surgeon: Serafina Mitchell, MD;  Location: [place] CV LAB;  Service: Cardiovascular;  Laterality: N/A;  . PERIPHERAL VASCULAR CATHETERIZATION  10/30/2015   Procedure: Peripheral Vascular Intervention;  Surgeon: Serafina Mitchell, MD;  Location: [place] CV LAB;  Service: Cardiovascular;;  . PERIPHERAL VASCULAR CATHETERIZATION N/A 04/01/2016   Procedure: Abdominal Aortogram w/Lower Extremity;  Surgeon: Serafina Mitchell, MD;  Location: [place] CV LAB;  Service: Cardiovascular;  Laterality: N/A;  . PERIPHERAL VASCULAR CATHETERIZATION Left 04/01/2016   Procedure: Peripheral Vascular Atherectomy;  Surgeon: Serafina Mitchell, MD;  Location: [place] CV LAB;  Service: Cardiovascular;  Laterality: Left;  Superficial femoral artery.  Marland Kitchen PR VEIN BYPASS GRAFT,AORTO-FEM-POP  09-13-09   Left Fem-pop  . TOTAL ELBOW ARTHROPLASTY Left 09/03/2013   Procedure: LEFT TOTAL ELBOW ARTHROPLASTY;  Surgeon: Roseanne Kaufman, MD;   Location: [place];  Service: Orthopedics;  Laterality: Left;    Allergies  Allergen Reactions  . Motrin [Ibuprofen] Other (See Comments)    GI bleeding  . Statins Other (See Comments)    Pain and muscle weakness  . Promethazine Hcl Other (See Comments)    IV  Drug only, makes her act crazy  . Sulfa Antibiotics Other (See Comments)    Unknown    Current Outpatient Prescriptions  Medication Sig Dispense Refill  . acetaminophen (TYLENOL) 500 MG tablet Take 1,000 mg by mouth 2 (two) times daily as needed for moderate pain or headache. Patient took this medication for her pain.    Marland Kitchen aspirin 81 MG tablet Take 81 mg by mouth daily.    . Cholecalciferol (VITAMIN D3) 2000 UNITS TABS Take 2,000 Units by mouth daily.    . clopidogrel (PLAVIX) 75 MG tablet Take 75 mg by mouth daily.    . diazepam (VALIUM) 5 MG tablet Take 2.5 mg by mouth at bedtime as needed for  anxiety.     . gabapentin (NEURONTIN) 300 MG capsule     . metoprolol (LOPRESSOR) 50 MG tablet Take 25-50 mg by mouth 2 (two) times daily. Take 50mg  by mouth in the morning and take 25mg  by mouth at bedtime.    . Multiple Vitamins-Minerals (EYE VITAMINS PO) Take 1 tablet by mouth daily.     . traMADol (ULTRAM) 50 MG tablet Take by mouth.    . valsartan-hydrochlorothiazide (DIOVAN-HCT) 160-12.5 MG per tablet Take 0.5 tablets by mouth daily.     No current facility-administered medications for this visit.     ROS: See HPI for pertinent positives and negatives.   Physical Examination  Vitals:   09/01/16 1346  BP: (!) 159/76  Pulse: 66  Resp: 14  Temp: 97.6 F (36.4 C)  TempSrc: Oral  SpO2: 99%  Weight: 130 lb (59 kg)  Height: 5\' 2"  (1.575 m)   Body mass index is 23.78 kg/m.  General:A&O x 3, WDWN  Gait: using walker, slow and deliberate Eyes: Pupils are equal Pulmonary: Respirations are non labored, CTAB, no wheezes, rhonchi, or rales.  Cardiac: regular rhythm and rate, no detected murmur     Carotid  Bruits Left Right   negative negative  Aorta: is not palpable Radial pulses: are 2+ palpable and =   VASCULAR EXAM: Extremities with ischemic changes: darkened area at dorsal aspect right 3rd toe mid phalangeal joint,  without Gangrene; without open wounds. Plantar aspect of left foot is mottled, slightly cyanotic. Faint red area of left medial calf that is tender but no swelling.     LE Pulses LEFT RIGHT   FEMORAL 1+ palpable  1+ palpable    POPLITEAL not palpable  not palpable   POSTERIOR TIBIAL  not palpable, faint Doppler signal   Not palpable, no Doppler signal    DORSALIS PEDIS  ANTERIOR TIBIAL not palpable, faint Doppler signal  not palpable, faint Doppler signal        PERONEAL  Not palpable, no Doppler signal Not palpable, no Doppler signal   Abdomen: soft, NT, no palpable masses.  Skin: no rashes, see Extremities. Musculoskeletal: no muscle wasting or atrophy. Neurologic: A&O X 3; Appropriate Affect; MOTOR FUNCTION: moving all extremities equally, motor strength 3/5 throughout. Speech is fluent/normal.  CN 2-12 intact.   ASSESSMENT: [name] is a 82 y.o. female who is s/p left distal superficial femoral to popliteal artery bypass graft 09/13/2009, left superficial femoral artery stents placed 04/25/2010 &12/11/2010; left external iliac artery stent, left superficial femoral artery angioplasty 12/26/2014, angioplasty of in-stent stenosis within the proximal above-knee popliteal anastomosis and stent placement in left SFA on 10/30/15 .  She again had velocity elevations and on 04/01/2016 [name] performed atherectomy of left SFA with a jetstream device and drug coated balloon  angioplasty.  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  No TIA since 2000.  Pt returns today with c/o pain in both lower legs that keeps her awake at night.  She reports severe pain in both calves with walking a short distance.  Also reports numbness in both lower legs and feet.  There is a darkened area at the dorsal aspect of her right 3rd toe that does not seem to be improving since she dropped something on that toe a month ago. X-ray result from 08/28/16 shows no fracture, no evidence of osteomyelitis in the right toes.  Plantar aspect  pf left foot is mottled and slightly cyanotic.   Left calf with new red tender area at medial aspect, negative for DVT today.    I discussed pt HPI and physical exam results with [name] who spoke with pt and two family members with her. He also examined pt.   Face to face time with patient was 25 minutes. Over 50% of this time was spent on counseling and coordination of care.   DATA  Right 3rd toe x-ray result from 08/28/16: FINDINGS: No evidence of fracture, dislocation, degenerative change or any sign of osteomyelitis there is an old healed fracture of the fifth metatarsal. IMPRESSION: No abnormality seen of the third digit. Old healed fracture of the fifth metatarsal.  03/19/16 left LE arterial duplex indicates 50 - 74% left proximal  native superficial femoral artery stenosis. Greater than 75% stenosis in the left proximal thigh stent at origin (589 cm/s) and proximal (437 cm/s) stent. The distal left SFA to popliteal stent appears patent with no stenosis visualize.  03/19/16 carotid duplex suggests less than 40% bilateral internal carotid artery stenosis.  DVT duplex of left LE today is negative for DVT.   PLAN:  Based on the patient's vascular studies and examination, pt will be scheduled for arteriogram with bilateral run off, possible  intervention, tomorrow by [name].   I discussed in depth with the patient the nature of atherosclerosis, and emphasized the importance of maximal medical management including strict control of blood pressure, blood glucose, and lipid levels, obtaining regular exercise, and continued cessation of smoking.  The patient is aware that without maximal medical management the underlying atherosclerotic disease process will progress, limiting the benefit of any interventions.  The patient was given information about PAD including signs, symptoms, treatment, what symptoms should prompt the patient to seek immediate medical care, and risk reduction measures to take.  Clemon Chambers, RN, MSN, FNP-C [facility] [employer] Phone: [phone]  Clinic MD: Trula Slade  09/01/16 1:56 PM

## 2016-09-02 ENCOUNTER — Other Ambulatory Visit: Payer: Self-pay

## 2016-09-05 ENCOUNTER — Encounter (HOSPITAL_COMMUNITY): Payer: Self-pay | Admitting: General Practice

## 2016-09-05 ENCOUNTER — Ambulatory Visit (HOSPITAL_COMMUNITY)
Admission: RE | Admit: 2016-09-05 | Discharge: 2016-09-06 | Disposition: A | Discharge status: Home / Self Care | Payer: Medicare Other | Source: Ambulatory Visit | Attending: Surgery | Admitting: Surgery

## 2016-09-05 ENCOUNTER — Encounter (HOSPITAL_COMMUNITY)
Admission: RE | Disposition: A | Discharge status: Home / Self Care | Payer: Self-pay | Source: Ambulatory Visit | Attending: Surgery

## 2016-09-05 DIAGNOSIS — Z833 Family history of diabetes mellitus: Secondary | ICD-10-CM | POA: Insufficient documentation

## 2016-09-05 DIAGNOSIS — Z86718 Personal history of other venous thrombosis and embolism: Secondary | ICD-10-CM | POA: Diagnosis not present

## 2016-09-05 DIAGNOSIS — Z87891 Personal history of nicotine dependence: Secondary | ICD-10-CM | POA: Insufficient documentation

## 2016-09-05 DIAGNOSIS — I739 Peripheral vascular disease, unspecified: Secondary | ICD-10-CM | POA: Diagnosis present

## 2016-09-05 DIAGNOSIS — Z882 Allergy status to sulfonamides status: Secondary | ICD-10-CM | POA: Diagnosis not present

## 2016-09-05 DIAGNOSIS — Z811 Family history of alcohol abuse and dependence: Secondary | ICD-10-CM | POA: Insufficient documentation

## 2016-09-05 DIAGNOSIS — Z888 Allergy status to other drugs, medicaments and biological substances status: Secondary | ICD-10-CM | POA: Diagnosis not present

## 2016-09-05 DIAGNOSIS — Z85828 Personal history of other malignant neoplasm of skin: Secondary | ICD-10-CM | POA: Insufficient documentation

## 2016-09-05 DIAGNOSIS — Z8673 Personal history of transient ischemic attack (TIA), and cerebral infarction without residual deficits: Secondary | ICD-10-CM | POA: Diagnosis not present

## 2016-09-05 DIAGNOSIS — I743 Embolism and thrombosis of arteries of the lower extremities: Secondary | ICD-10-CM | POA: Insufficient documentation

## 2016-09-05 DIAGNOSIS — I10 Essential (primary) hypertension: Secondary | ICD-10-CM | POA: Diagnosis not present

## 2016-09-05 DIAGNOSIS — R200 Anesthesia of skin: Secondary | ICD-10-CM | POA: Insufficient documentation

## 2016-09-05 DIAGNOSIS — M79662 Pain in left lower leg: Secondary | ICD-10-CM | POA: Diagnosis present

## 2016-09-05 DIAGNOSIS — I70223 Atherosclerosis of native arteries of extremities with rest pain, bilateral legs: Secondary | ICD-10-CM | POA: Diagnosis not present

## 2016-09-05 DIAGNOSIS — Z8349 Family history of other endocrine, nutritional and metabolic diseases: Secondary | ICD-10-CM | POA: Diagnosis not present

## 2016-09-05 DIAGNOSIS — T82856A Stenosis of peripheral vascular stent, initial encounter: Secondary | ICD-10-CM | POA: Diagnosis not present

## 2016-09-05 DIAGNOSIS — Z7902 Long term (current) use of antithrombotics/antiplatelets: Secondary | ICD-10-CM | POA: Diagnosis not present

## 2016-09-05 DIAGNOSIS — Z7982 Long term (current) use of aspirin: Secondary | ICD-10-CM | POA: Insufficient documentation

## 2016-09-05 DIAGNOSIS — M79661 Pain in right lower leg: Secondary | ICD-10-CM | POA: Diagnosis present

## 2016-09-05 DIAGNOSIS — Z8249 Family history of ischemic heart disease and other diseases of the circulatory system: Secondary | ICD-10-CM | POA: Diagnosis not present

## 2016-09-05 DIAGNOSIS — Z96622 Presence of left artificial elbow joint: Secondary | ICD-10-CM | POA: Insufficient documentation

## 2016-09-05 DIAGNOSIS — Z79899 Other long term (current) drug therapy: Secondary | ICD-10-CM | POA: Insufficient documentation

## 2016-09-05 DIAGNOSIS — Z886 Allergy status to analgesic agent status: Secondary | ICD-10-CM | POA: Insufficient documentation

## 2016-09-05 DIAGNOSIS — Z90710 Acquired absence of both cervix and uterus: Secondary | ICD-10-CM | POA: Insufficient documentation

## 2016-09-05 DIAGNOSIS — Z9889 Other specified postprocedural states: Secondary | ICD-10-CM | POA: Insufficient documentation

## 2016-09-05 DIAGNOSIS — Y828 Other medical devices associated with adverse incidents: Secondary | ICD-10-CM | POA: Diagnosis not present

## 2016-09-05 HISTORY — DX: Personal history of other diseases of the digestive system: Z87.19

## 2016-09-05 HISTORY — DX: Low back pain, unspecified: M54.50

## 2016-09-05 HISTORY — DX: Fall (on) (from) unspecified stairs and steps, initial encounter: W10.9XXA

## 2016-09-05 HISTORY — DX: Squamous cell carcinoma of skin of right lower limb, including hip: C44.722

## 2016-09-05 HISTORY — DX: Other chronic pain: G89.29

## 2016-09-05 HISTORY — DX: Fibromyalgia: M79.7

## 2016-09-05 HISTORY — DX: Exudative age-related macular degeneration, unspecified eye, stage unspecified: H35.3290

## 2016-09-05 HISTORY — DX: Anemia, unspecified: D64.9

## 2016-09-05 HISTORY — DX: Adverse effect of unspecified anesthetic, initial encounter: T41.45XA

## 2016-09-05 HISTORY — DX: Angina pectoris, unspecified: I20.9

## 2016-09-05 HISTORY — DX: Transient cerebral ischemic attack, unspecified: G45.9

## 2016-09-05 HISTORY — DX: Low back pain: M54.5

## 2016-09-05 HISTORY — DX: Gastro-esophageal reflux disease without esophagitis: K21.9

## 2016-09-05 HISTORY — DX: Age-related osteoporosis without current pathological fracture: M81.0

## 2016-09-05 HISTORY — DX: Unspecified osteoarthritis, unspecified site: M19.90

## 2016-09-05 HISTORY — DX: Pure hypercholesterolemia, unspecified: E78.00

## 2016-09-05 HISTORY — PX: PERIPHERAL VASCULAR CATHETERIZATION: SHX172C

## 2016-09-05 HISTORY — DX: Other complications of anesthesia, initial encounter: T88.59XA

## 2016-09-05 LAB — POCT I-STAT, CHEM 8
BUN: 36 mg/dL — AB (ref 6–20)
CALCIUM ION: 1.19 mmol/L (ref 1.15–1.40)
Chloride: 104 mmol/L (ref 101–111)
Creatinine, Ser: 1.2 mg/dL — ABNORMAL HIGH (ref 0.44–1.00)
Glucose, Bld: 106 mg/dL — ABNORMAL HIGH (ref 65–99)
HCT: 39 % (ref 36.0–46.0)
HEMOGLOBIN: 13.3 g/dL (ref 12.0–15.0)
Potassium: 4.1 mmol/L (ref 3.5–5.1)
SODIUM: 138 mmol/L (ref 135–145)
TCO2: 25 mmol/L (ref 0–100)

## 2016-09-05 LAB — POCT ACTIVATED CLOTTING TIME
ACTIVATED CLOTTING TIME: 213 s
ACTIVATED CLOTTING TIME: 164 s
Activated Clotting Time: 241 seconds

## 2016-09-05 SURGERY — ABDOMINAL AORTOGRAM W/LOWER EXTREMITY
Laterality: Right

## 2016-09-05 MED ORDER — HYDROCHLOROTHIAZIDE 12.5 MG PO CAPS
12.5000 mg | ORAL_CAPSULE | Freq: Every day | ORAL | Status: DC
  Administered 2016-09-06: 10:00:00 12.5 mg via ORAL
  Filled 2016-09-05: qty 1

## 2016-09-05 MED ORDER — CLOPIDOGREL BISULFATE 75 MG PO TABS
75.0000 mg | ORAL_TABLET | Freq: Every day | ORAL | Status: DC
  Administered 2016-09-06: 10:00:00 75 mg via ORAL
  Filled 2016-09-05: qty 1

## 2016-09-05 MED ORDER — ALUM & MAG HYDROXIDE-SIMETH 200-200-20 MG/5ML PO SUSP: 15.0000 mL | ORAL | Status: DC | PRN

## 2016-09-05 MED ORDER — ASPIRIN 81 MG PO CHEW
81.0000 mg | CHEWABLE_TABLET | Freq: Every day | ORAL | Status: DC
  Administered 2016-09-06: 81 mg via ORAL
  Filled 2016-09-05: qty 1

## 2016-09-05 MED ORDER — METOPROLOL TARTRATE 25 MG PO TABS
25.0000 mg | ORAL_TABLET | Freq: Every evening | ORAL | Status: DC
  Administered 2016-09-05: 21:00:00 25 mg via ORAL
  Filled 2016-09-05: qty 1

## 2016-09-05 MED ORDER — OCUVITE-LUTEIN PO CAPS
1.0000 | ORAL_CAPSULE | Freq: Every day | ORAL | Status: DC
  Filled 2016-09-05: qty 1

## 2016-09-05 MED ORDER — METOPROLOL TARTRATE 25 MG PO TABS
50.0000 mg | ORAL_TABLET | Freq: Every morning | ORAL | Status: DC
  Administered 2016-09-06: 50 mg via ORAL
  Filled 2016-09-05: qty 2

## 2016-09-05 MED ORDER — ANGIOPLASTY BOOK
Freq: Once | Status: AC
  Administered 2016-09-05: 20:00:00
  Filled 2016-09-05: qty 1

## 2016-09-05 MED ORDER — HEPARIN (PORCINE) IN NACL 2-0.9 UNIT/ML-% IJ SOLN
INTRAMUSCULAR | Status: AC
  Filled 2016-09-05: qty 1000

## 2016-09-05 MED ORDER — HEPARIN SODIUM (PORCINE) 1000 UNIT/ML IJ SOLN
INTRAMUSCULAR | Status: DC | PRN
  Administered 2016-09-05: 6000 [IU] via INTRAVENOUS

## 2016-09-05 MED ORDER — GUAIFENESIN-DM 100-10 MG/5ML PO SYRP: 15.0000 mL | ORAL_SOLUTION | ORAL | Status: DC | PRN

## 2016-09-05 MED ORDER — IRBESARTAN 75 MG PO TABS
150.0000 mg | ORAL_TABLET | Freq: Every day | ORAL | Status: DC
  Administered 2016-09-06: 150 mg via ORAL
  Filled 2016-09-05: qty 2

## 2016-09-05 MED ORDER — TRAMADOL HCL 50 MG PO TABS
50.0000 mg | ORAL_TABLET | Freq: Every evening | ORAL | Status: DC | PRN
  Administered 2016-09-05: 50 mg via ORAL
  Filled 2016-09-05: qty 1

## 2016-09-05 MED ORDER — MIDAZOLAM HCL 2 MG/2ML IJ SOLN
INTRAMUSCULAR | Status: AC
  Filled 2016-09-05: qty 2

## 2016-09-05 MED ORDER — VITAMIN D3 25 MCG (1000 UNIT) PO TABS
2000.0000 [IU] | ORAL_TABLET | Freq: Every day | ORAL | Status: DC
  Administered 2016-09-06: 10:00:00 2000 [IU] via ORAL
  Filled 2016-09-05 (×2): qty 2

## 2016-09-05 MED ORDER — ACETAMINOPHEN 500 MG PO TABS: 1000.0000 mg | ORAL_TABLET | Freq: Two times a day (BID) | ORAL | Status: DC | PRN

## 2016-09-05 MED ORDER — VALSARTAN-HYDROCHLOROTHIAZIDE 160-12.5 MG PO TABS: 0.5000 | ORAL_TABLET | Freq: Every day | ORAL | Status: DC

## 2016-09-05 MED ORDER — DOCUSATE SODIUM 100 MG PO CAPS
100.0000 mg | ORAL_CAPSULE | Freq: Every day | ORAL | Status: DC
  Administered 2016-09-06: 10:00:00 100 mg via ORAL
  Filled 2016-09-05: qty 1

## 2016-09-05 MED ORDER — GABAPENTIN 300 MG PO CAPS
300.0000 mg | ORAL_CAPSULE | Freq: Two times a day (BID) | ORAL | Status: DC
  Administered 2016-09-05 – 2016-09-06 (×2): 300 mg via ORAL
  Filled 2016-09-05 (×2): qty 1

## 2016-09-05 MED ORDER — ACETAMINOPHEN 650 MG RE SUPP: 325.0000 mg | RECTAL | Status: DC | PRN

## 2016-09-05 MED ORDER — LOPERAMIDE HCL 2 MG PO CAPS: 2.0000 mg | ORAL_CAPSULE | ORAL | Status: DC | PRN

## 2016-09-05 MED ORDER — OXYCODONE HCL 5 MG PO TABS
5.0000 mg | ORAL_TABLET | ORAL | Status: DC | PRN
  Administered 2016-09-05 (×2): 5 mg via ORAL
  Filled 2016-09-05 (×3): qty 1

## 2016-09-05 MED ORDER — ACETAMINOPHEN 325 MG PO TABS: 325.0000 mg | ORAL_TABLET | ORAL | Status: DC | PRN

## 2016-09-05 MED ORDER — FENTANYL CITRATE (PF) 100 MCG/2ML IJ SOLN
INTRAMUSCULAR | Status: AC
  Filled 2016-09-05: qty 2

## 2016-09-05 MED ORDER — HYDRALAZINE HCL 20 MG/ML IJ SOLN
INTRAMUSCULAR | Status: AC
  Filled 2016-09-05: qty 1

## 2016-09-05 MED ORDER — MORPHINE SULFATE (PF) 2 MG/ML IV SOLN: 2.0000 mg | INTRAVENOUS | Status: DC | PRN

## 2016-09-05 MED ORDER — ATROPINE SULFATE 1 MG/10ML IJ SOSY
PREFILLED_SYRINGE | INTRAMUSCULAR | Status: AC
  Filled 2016-09-05: qty 10

## 2016-09-05 MED ORDER — SODIUM CHLORIDE 0.9 % IV SOLN: INTRAVENOUS | Status: DC

## 2016-09-05 MED ORDER — METOPROLOL TARTRATE 25 MG PO TABS: 25.0000 mg | ORAL_TABLET | Freq: Two times a day (BID) | ORAL | Status: DC

## 2016-09-05 MED ORDER — SODIUM CHLORIDE 0.9 % IV SOLN
INTRAVENOUS | Status: DC
  Administered 2016-09-05: 12:00:00 via INTRAVENOUS

## 2016-09-05 MED ORDER — PHENOL 1.4 % MT LIQD
1.0000 | OROMUCOSAL | Status: DC | PRN
  Filled 2016-09-05: qty 177

## 2016-09-05 MED ORDER — HEPARIN (PORCINE) IN NACL 2-0.9 UNIT/ML-% IJ SOLN
INTRAMUSCULAR | Status: DC | PRN
  Administered 2016-09-05: 1000 mL

## 2016-09-05 MED ORDER — LIDOCAINE HCL (PF) 1 % IJ SOLN
INTRAMUSCULAR | Status: AC
  Filled 2016-09-05: qty 30

## 2016-09-05 MED ORDER — FENTANYL CITRATE (PF) 100 MCG/2ML IJ SOLN
INTRAMUSCULAR | Status: DC | PRN
  Administered 2016-09-05 (×6): 25 ug via INTRAVENOUS

## 2016-09-05 MED ORDER — ONDANSETRON HCL 4 MG/2ML IJ SOLN: 4.0000 mg | Freq: Four times a day (QID) | INTRAMUSCULAR | Status: DC | PRN

## 2016-09-05 MED ORDER — HYDRALAZINE HCL 20 MG/ML IJ SOLN
INTRAMUSCULAR | Status: DC | PRN
  Administered 2016-09-05: 10 mg via INTRAVENOUS

## 2016-09-05 MED ORDER — MIDAZOLAM HCL 2 MG/2ML IJ SOLN
INTRAMUSCULAR | Status: DC | PRN
  Administered 2016-09-05 (×4): 1 mg via INTRAVENOUS

## 2016-09-05 MED ORDER — METOPROLOL TARTRATE 5 MG/5ML IV SOLN
2.0000 mg | INTRAVENOUS | Status: AC | PRN
  Administered 2016-09-05: 22:00:00 2 mg via INTRAVENOUS
  Administered 2016-09-06: 06:00:00 2.5 mg via INTRAVENOUS
  Filled 2016-09-05 (×2): qty 5

## 2016-09-05 MED ORDER — DIAZEPAM 5 MG PO TABS: 2.5000 mg | ORAL_TABLET | Freq: Every evening | ORAL | Status: DC | PRN

## 2016-09-05 SURGICAL SUPPLY — 29 items
BAG SNAP BAND KOVER 36X36 (MISCELLANEOUS) ×1 IMPLANT
BALLN ANGIOSCULPT 4X100 (BALLOONS) ×3
BALLN LUTONIX 4X150X130 (BALLOONS) ×6
BALLN LUTONIX DCB 4X100X130 (BALLOONS) ×3
BALLOON ANGIOSCULPT 4X100 (BALLOONS) IMPLANT
BALLOON LUTONIX 4X150X130 (BALLOONS) IMPLANT
BALLOON LUTONIX DCB 4X100X130 (BALLOONS) IMPLANT
CATH OMNI FLUSH 5F 65CM (CATHETERS) ×1 IMPLANT
CATH QUICKCROSS SUPP .018X90CM (MICROCATHETER) IMPLANT
CATH QUICKCROSS SUPP .035X90CM (MICROCATHETER) ×1 IMPLANT
COVER DOME SNAP 22 D (MISCELLANEOUS) ×1 IMPLANT
DEVICE CONTINUOUS FLUSH (MISCELLANEOUS) ×1 IMPLANT
DEVICE TORQUE .014-.018 (MISCELLANEOUS) IMPLANT
GUIDEWIRE ANGLED .035X150CM (WIRE) ×1 IMPLANT
GUIDEWIRE STR TIP .014X300X8 (WIRE) ×1 IMPLANT
KIT ENCORE 26 ADVANTAGE (KITS) ×1 IMPLANT
KIT MICROINTRODUCER STIFF 5F (SHEATH) ×1 IMPLANT
KIT PV (KITS) ×3 IMPLANT
SHEATH FLEXOR ANSEL 1 7F 45CM (SHEATH) ×1 IMPLANT
SHEATH PINNACLE 5F 10CM (SHEATH) ×1 IMPLANT
SHEATH PINNACLE 7F 10CM (SHEATH) ×1 IMPLANT
SHIELD RADPAD SCOOP 12X17 (MISCELLANEOUS) ×1 IMPLANT
STENT INNOVA 6X100X130 (Permanent Stent) ×1 IMPLANT
SYR MEDRAD MARK V 150ML (SYRINGE) ×3 IMPLANT
TAPE RADIOPAQUE TURBO (MISCELLANEOUS) ×1 IMPLANT
TORQUE DEVICE .014-.018 (MISCELLANEOUS) ×3
TRANSDUCER W/STOPCOCK (MISCELLANEOUS) ×3 IMPLANT
TRAY PV CATH (CUSTOM PROCEDURE TRAY) ×3 IMPLANT
WIRE BENTSON .035X145CM (WIRE) ×1 IMPLANT

## 2016-09-05 NOTE — Progress Notes (Signed)
Site area: left groin  Site Prior to Removal:  Level 0  Pressure Applied For 40 MINUTES    Minutes Beginning at 1950  Manual:   Yes.    Patient Status During Pull:  Stable; after initial 20 minutes pt had a re-bleed. Pressure held additional 20 minutes.   Post Pull Groin Site:  Level 0  Post Pull Instructions Given:  Yes.    Post Pull Pulses Present:  Yes.   doppler  Dressing Applied:  Yes.    Comments:  Pt tolerated well. Teaching completed and verbalized by daughter at bedside.

## 2016-09-05 NOTE — Interval H&P Note (Signed)
History and Physical Interval Note:  09/05/2016 4:23 PM  [name]  has presented today for surgery, with the diagnosis of pvd w/ bilteral lower extremity rest pain  The various methods of treatment have been discussed with the patient and family. After consideration of risks, benefits and other options for treatment, the patient has consented to  Procedure(s): Abdominal Aortogram w/Lower Extremity (N/A) as a surgical intervention .  The patient's history has been reviewed, patient examined, no change in status, stable for surgery.  I have reviewed the patient's chart and labs.  Questions were answered to the patient's satisfaction.     [name]

## 2016-09-05 NOTE — H&P (View-Only) (Signed)
[facility] [place]   CC: Follow up peripheral artery occlusive disease  History of Present Illness [name] is a 82 y.o. female patient of [name] who initially presented in October 2010 with ischemic changes to her left foot. She underwent distal left superficial femoral to below knee popliteal artery bypass graft with reversed ipsilateral greater saphenous vein she did have issues with wound healing which ultimately resolved. She developed a high-grade stenosis within the popliteal artery above the proximal anastomosis which was stented in 2011. She was also found to have an elevated velocity and on 12/11/2010 and in-stent stenosis was re\re stented using a 6 x 30 EV3 stents.   She then had an ultrasound that showed native superficial femoral artery stenosis proximal to her stents. Therefore on 01/05/2015 she underwent angiography. At that time, she had stenting of her left external iliac stenosis with a 7 x 30 self-expanding stent as well as stenting of her left superficial femoral artery using a 6 x 30 self-expanding stent. The lesion in the superficial femoral artery was initially treated with a drug coated balloon angioplasty which was complicated by non-flow limiting dissection requiring stenting the bypass graft was found to be widely patent with two-vessel runoff via the peroneal and anterior tibial artery.  She developed velocity elevations again and in December 2016 underwent angiography and stenting as well as in-stent stenosis angioplasty of her left superficial femoral-popliteal artery.  She again had velocity elevations recently and on 04/01/2016 [name] performed atherectomy with a jetstream device and drug coated balloon angioplasty. She has difficulty with walking.  This likely stems from her hip.  Hip replacement surgery has been recommended.    She returns today with c/o increased pain to both lower legs and on the third toe of her right  foot there is a dark spot since she dropped something on her toe about a month ago; there was an open wound at the dorsal aspect of her right 3rd toe MIP joint, but this healed after her PCP obtained a culture of this on 08/28/16.  The patient stated that she had seen her PCP 08/28/16 and he had this toe x-rayed.  Patient is c/o numbness in both feet.  A red area developed recently at the left mid calf, medial aspect.  Both feet are tender to touch. Both calves and feet are numb.  Pain in feet and calves keep her awake at night. Her PCP prescribed tramadol and gabapentin which have helped a little.  Around Thanksgiving 2016 she developed bronchitis, treated by her PCP for this and feels improved.  [place] October 2014 and fractured left pelvis, had left elbow replacement, fractured left ankle and wrist.  She needs a left hip replacement but does not want to have that done at this point.  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   Pt Diabetic: No Pt smoker: non-smoker  Pt meds include: Statin :No, statins cause aching Betablocker: Yes ASA: Yes Other anticoagulants/antiplatelets: Plavix    Past Medical History:  Diagnosis Date  . Cancer ([place]) Aug. 2015   SCC - Right Lower Leg  . DVT (deep venous thrombosis) ([place])   . Fall Oct. 15, 2014   Fx. pelvis, Left Hip, Left Elbow  . History of angina   . Hypertension   . Peripheral vascular disease ([place])   . Stroke [facility])    History of TIA  Social History Social History  Substance Use Topics  . Smoking status: Former Smoker    Types: Cigarettes    Quit date: 11/17/1946  . Smokeless tobacco: Never Used  . Alcohol use No    Family History Family History  Problem Relation Age of Onset  . Heart disease Father     Heart Disease before age 51  . Hyperlipidemia Father   . Hypertension Father   . Alcohol abuse Father   . Heart disease  Brother   . Hyperlipidemia Brother   . Hypertension Brother   . Deep vein thrombosis Brother   . Heart disease Son     Heart Disease before age 34  . Hyperlipidemia Son   . Hypertension Son   . Heart attack Son   . Diabetes Son   . Hypertension Son   . Hyperlipidemia Sister   . Hypertension Sister     Past Surgical History:  Procedure Laterality Date  . ABDOMINAL AORTAGRAM N/A 12/26/2014   Procedure: ABDOMINAL Maxcine Ham;  Surgeon: Serafina [facility], MD;  Location[facility] C[facility] LAB;  Service: Cardiovascular;  Laterality: N/A;  . ABDOMINAL HYSTERECTOMY    . FEMORAL ARTERY STENT  12-11-10   Left SFA  . INSERTION OF ILIAC STENT Left 12/26/2014   Procedure: INSERTION OF ILIAC STENT;  Surgeon: Serafina [facility], MD;  Location: [facility] CATH LAB;  Service: Cardiovascular;  Laterality: Left;  . JOINT REPLACEMENT     knee  . JOINT REPLACEMENT Left Oct. 17, 2014   Elbow ( pt fell 08-31-13 )  . PERIPHERAL VASCULAR CATHETERIZATION N/A 10/30/2015   Procedure: Abdominal Aortogram w/Lower Extremity;  Surgeon: Serafina [facility], MD;  Location: [place] CV LAB;  Service: Cardiovascular;  Laterality: N/A;  . PERIPHERAL VASCULAR CATHETERIZATION  10/30/2015   Procedure: Peripheral Vascular Intervention;  Surgeon: Serafina [facility], MD;  Location: [place] CV LAB;  Service: Cardiovascular;;  . PERIPHERAL VASCULAR CATHETERIZATION N/A 04/01/2016   Procedure: Abdominal Aortogram w/Lower Extremity;  Surgeon: Serafina [facility], MD;  Location: [place] CV LAB;  Service: Cardiovascular;  Laterality: N/A;  . PERIPHERAL VASCULAR CATHETERIZATION Left 04/01/2016   Procedure: Peripheral Vascular Atherectomy;  Surgeon: Serafina [facility], MD;  Location: [place] CV LAB;  Service: Cardiovascular;  Laterality: Left;  Superficial femoral artery.  Marland Kitchen PR VEIN BYPASS GRAFT,AORTO-FEM-POP  09-13-09   Left Fem-pop  . TOTAL ELBOW ARTHROPLASTY Left 09/03/2013   Procedure: LEFT TOTAL ELBOW ARTHROPLASTY;  Surgeon: Roseanne Kaufman, MD;   Location: [facility];  Service: Orthopedics;  Laterality: Left;    Allergies  Allergen Reactions  . Motrin [Ibuprofen] Other (See Comments)    GI bleeding  . Statins Other (See Comments)    Pain and muscle weakness  . Promethazine Hcl Other (See Comments)    IV  Drug only, makes her act crazy  . Sulfa Antibiotics Other (See Comments)    Unknown    Current Outpatient Prescriptions  Medication Sig Dispense Refill  . acetaminophen (TYLENOL) 500 MG tablet Take 1,000 mg by mouth 2 (two) times daily as needed for moderate pain or headache. Patient took this medication for her pain.    Marland Kitchen aspirin 81 MG tablet Take 81 mg by mouth daily.    . Cholecalciferol (VITAMIN D3) 2000 UNITS TABS Take 2,000 Units by mouth daily.    . clopidogrel (PLAVIX) 75 MG tablet Take 75 mg by mouth daily.    . diazepam (VALIUM) 5 MG tablet Take 2.5 mg by mouth at bedtime as needed for  anxiety.     . gabapentin (NEURONTIN) 300 MG capsule     . metoprolol (LOPRESSOR) 50 MG tablet Take 25-50 mg by mouth 2 (two) times daily. Take 50mg  by mouth in the morning and take 25mg  by mouth at bedtime.    . Multiple Vitamins-Minerals (EYE VITAMINS PO) Take 1 tablet by mouth daily.     . traMADol (ULTRAM) 50 MG tablet Take by mouth.    . valsartan-hydrochlorothiazide (DIOVAN-HCT) 160-12.5 MG per tablet Take 0.5 tablets by mouth daily.     No current facility-administered medications for this visit.     ROS: See HPI for pertinent positives and negatives.   Physical Examination  Vitals:   09/01/16 1346  BP: (!) 159/76  Pulse: 66  Resp: 14  Temp: 97.6 F (36.4 C)  TempSrc: Oral  SpO2: 99%  Weight: 130 lb (59 kg)  Height: 5\' 2"  (1.575 m)   Body mass index is 23.78 kg/m.  General:A&O x 3, WDWN  Gait: using walker, slow and deliberate Eyes: Pupils are equal Pulmonary: Respirations are non labored, CTAB, no wheezes, rhonchi, or rales.  Cardiac: regular rhythm and rate, no detected murmur     Carotid  Bruits Left Right   negative negative  Aorta: is not palpable Radial pulses: are 2+ palpable and =   VASCULAR EXAM: Extremities with ischemic changes: darkened area at dorsal aspect right 3rd toe mid phalangeal joint,  without Gangrene; without open wounds. Plantar aspect of left foot is mottled, slightly cyanotic. Faint red area of left medial calf that is tender but no swelling.     LE Pulses LEFT RIGHT   FEMORAL 1+ palpable  1+ palpable    POPLITEAL not palpable  not palpable   POSTERIOR TIBIAL  not palpable, faint Doppler signal   Not palpable, no Doppler signal    DORSALIS PEDIS  ANTERIOR TIBIAL not palpable, faint Doppler signal  not palpable, faint Doppler signal        PERONEAL  Not palpable, no Doppler signal Not palpable, no Doppler signal   Abdomen: soft, NT, no palpable masses.  Skin: no rashes, see Extremities. Musculoskeletal: no muscle wasting or atrophy. Neurologic: A&O X 3; Appropriate Affect; MOTOR FUNCTION: moving all extremities equally, motor strength 3/5 throughout. Speech is fluent/normal.  CN 2-12 intact.   ASSESSMENT: [name] is a 82 y.o. female who is s/p left distal superficial femoral to popliteal artery bypass graft 09/13/2009, left superficial femoral artery stents placed 04/25/2010 &12/11/2010; left external iliac artery stent, left superficial femoral artery angioplasty 12/26/2014, angioplasty of in-stent stenosis within the proximal above-knee popliteal anastomosis and stent placement in left SFA on 10/30/15 .  She again had velocity elevations and on 04/01/2016 [name] performed atherectomy of left SFA with a jetstream device and drug coated balloon  angioplasty.  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  No TIA since 2000.  Pt returns today with c/o pain in both lower legs that keeps her awake at night.  She reports severe pain in both calves with walking a short distance.  Also reports numbness in both lower legs and feet.  There is a darkened area at the dorsal aspect of her right 3rd toe that does not seem to be improving since she dropped something on that toe a month ago. X-ray result from 08/28/16 shows no fracture, no evidence of osteomyelitis in the right toes.  Plantar aspect  pf left foot is mottled and slightly cyanotic.   Left calf with new red tender area at medial aspect, negative for DVT today.    I discussed pt HPI and physical exam results with [name] who spoke with pt and two family members with her. He also examined pt.   Face to face time with patient was 25 minutes. Over 50% of this time was spent on counseling and coordination of care.   DATA  Right 3rd toe x-ray result from 08/28/16: FINDINGS: No evidence of fracture, dislocation, degenerative change or any sign of osteomyelitis there is an old healed fracture of the fifth metatarsal. IMPRESSION: No abnormality seen of the third digit. Old healed fracture of the fifth metatarsal.  03/19/16 left LE arterial duplex indicates 50 - 74% left proximal  native superficial femoral artery stenosis. Greater than 75% stenosis in the left proximal thigh stent at origin (589 cm/s) and proximal (437 cm/s) stent. The distal left SFA to popliteal stent appears patent with no stenosis visualize.  03/19/16 carotid duplex suggests less than 40% bilateral internal carotid artery stenosis.  DVT duplex of left LE today is negative for DVT.   PLAN:  Based on the patient's vascular studies and examination, pt will be scheduled for arteriogram with bilateral run off, possible  intervention, tomorrow by [name].   I discussed in depth with the patient the nature of atherosclerosis, and emphasized the importance of maximal medical management including strict control of blood pressure, blood glucose, and lipid levels, obtaining regular exercise, and continued cessation of smoking.  The patient is aware that without maximal medical management the underlying atherosclerotic disease process will progress, limiting the benefit of any interventions.  The patient was given information about PAD including signs, symptoms, treatment, what symptoms should prompt the patient to seek immediate medical care, and risk reduction measures to take.  Clemon Chambers, RN, MSN, FNP-C [facility] Phone: [phone]  Clinic MD: Trula Slade  09/01/16 1:56 PM

## 2016-09-06 DIAGNOSIS — I739 Peripheral vascular disease, unspecified: Secondary | ICD-10-CM | POA: Diagnosis not present

## 2016-09-06 DIAGNOSIS — T82856A Stenosis of peripheral vascular stent, initial encounter: Secondary | ICD-10-CM | POA: Diagnosis not present

## 2016-09-06 DIAGNOSIS — Z85828 Personal history of other malignant neoplasm of skin: Secondary | ICD-10-CM | POA: Diagnosis not present

## 2016-09-06 DIAGNOSIS — Z86718 Personal history of other venous thrombosis and embolism: Secondary | ICD-10-CM | POA: Diagnosis not present

## 2016-09-06 DIAGNOSIS — I743 Embolism and thrombosis of arteries of the lower extremities: Secondary | ICD-10-CM | POA: Diagnosis not present

## 2016-09-06 DIAGNOSIS — R200 Anesthesia of skin: Secondary | ICD-10-CM | POA: Diagnosis not present

## 2016-09-06 MED ORDER — PROSIGHT PO TABS
1.0000 | ORAL_TABLET | Freq: Every day | ORAL | Status: DC
  Administered 2016-09-06: 10:00:00 1 via ORAL
  Filled 2016-09-06: qty 1

## 2016-09-06 NOTE — Discharge Summary (Signed)
Discharge Summary    [name] [birth year] 82 y.o. female  [id number]  Admission Date: 09/05/2016  Discharge Date: 09/06/16  Physician: Serafina [facility], MD  Admission Diagnosis: pvd with bilteral lower extremity rest pain   HPI:   This is a 82 y.o. female patient of Dr. Trula [name] who initially presented in October 2010 with ischemic changes to her left foot. She underwent distal left superficial femoral to below knee popliteal artery bypass graft with reversed ipsilateral greater saphenous vein she did have issues with wound healing which ultimately resolved. She developed a high-grade stenosis within the popliteal artery above the proximal anastomosis which was stented in 2011. She was also found to have an elevated velocity and on 12/11/2010 and in-stent stenosis was re\re stented using a 6 x 30 EV3 stents.   She then had an ultrasound that showed native superficial femoral artery stenosis proximal to her stents. Therefore on 01/05/2015 she underwent angiography. At that time, she had stenting of her left external iliac stenosis with a 7 x 30 self-expanding stent as well as stenting of her left superficial femoral artery using a 6 x 30 self-expanding stent. The lesion in the superficial femoral artery was initially treated with a drug coated balloon angioplasty which was complicated by non-flow limiting dissection requiring stenting the bypass graft was found to be widely patent with two-vessel runoff via the peroneal and anterior tibial artery.  She developed velocity elevations again and in December 2016 underwent angiography and stenting as well as in-stent stenosis angioplasty of her left superficial femoral-popliteal artery. She again had velocity elevations recently and on 04/01/2016 Dr. Trula [name] performed atherectomy with a jetstream device and drug coated balloon angioplasty. She has difficulty with walking. This likely stems from her hip. Hip replacement surgery has  been recommended.   She returns today with c/o increased pain to both lower legs and on the third toe of her right foot there is a dark spot since she dropped something on her toe about a month ago; there was an open wound at the dorsal aspect of her right 3rd toe MIP joint, but this healed after her PCP obtained a culture of this on 08/28/16. The patient stated that she had seen her PCP 08/28/16 and he had this toe x-rayed. Patient is c/o numbness in both feet.  A red area developed recently at the left mid calf, medial aspect.  Both feet are tender to touch. Both calves and feet are numb.  Pain in feet and calves keep her awake at night. Her PCP prescribed tramadol and gabapentin which have helped a little.  Around Thanksgiving 2016 she developed bronchitis, treated by her PCP for this and feels improved.  [place] October 2014 and fractured left pelvis, had left elbow replacement, fractured left ankle and wrist.  She needs a left hip replacement but does not want to have that done at this point.  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   Hospital Course:  The patient was admitted to the hospital and taken to the operating room on 09/05/2016 and underwent: 1.  U/s guided access, left femoral artery 2.  Conscious sedation (97 minutes) 3.  Abdominal aortogram 4.  Right lower extremity runoff 5.  Drug coated balloon angioplasty, right superficial femoral and popliteal artery 6.  Stent, right superficial femoral artery     Intra-procedure findings include the following: Findings:  Aortogram:  No significant renal artery stenosis was identified. Aneurysmal degeneration of the infrarenal abdominal aorta. No significant stenotic lesions in the aortoiliac section             Right Lower Extremity:  Multiple greater than 70% stenotic lesions were identified within the right superficial  femoral artery.  The distal superficial femoral and proximal above-knee popliteal artery were occluded.  There is reconstitution of the above-knee popliteal artery.  The popliteal artery is patent throughout it's course.  There is single vessel runoff via the anterior tibial artery which does have mild to moderate calcific disease throughout.             Left Lower Extremity:  Not evaluated given the amount of contrast utilized for intervention  The pt tolerated the procedure well and was transported to the PACU in good condition.   By POD 1, She was doing well and discharged home.  The remainder of the hospital course consisted of increasing mobilization and increasing intake of solids without difficulty.  CBC    Component Value Date/Time   WBC 7.2 09/07/2013 0512   RBC 2.35 (L) 09/07/2013 0512   HGB 13.3 09/05/2016 1206   HCT 39.0 09/05/2016 1206   PLT 285 09/07/2013 0512   MCV 94.0 09/07/2013 0512   MCH 31.5 09/07/2013 0512   MCHC 33.5 09/07/2013 0512   RDW 14.1 09/07/2013 0512   LYMPHSABS 1.8 09/07/2013 0512   MONOABS 0.9 09/07/2013 0512   EOSABS 0.4 09/07/2013 0512   BASOSABS 0.0 09/07/2013 0512    BMET    Component Value Date/Time   NA 138 09/05/2016 1206   K 4.1 09/05/2016 1206   CL 104 09/05/2016 1206   CO2 23 09/07/2013 0512   GLUCOSE 106 (H) 09/05/2016 1206   BUN 36 (H) 09/05/2016 1206   CREATININE 1.20 (H) 09/05/2016 1206   CALCIUM 8.6 09/07/2013 0512   GFRNONAA 54 (L) 09/07/2013 0512   GFRAA 63 (L) 09/07/2013 0512      Discharge Instructions    Call MD for:  redness, tenderness, or signs of infection (pain, swelling, bleeding, redness, odor or green/yellow discharge around incision site)    Complete by:  As directed    Call MD for:  severe or increased pain, loss or decreased feeling  in affected limb(s)    Complete by:  As directed    Call MD for:  temperature >100.5    Complete by:  As directed    Discharge wound care:    Complete by:  As directed     Shower daily with soap and water starting 09/07/16   Lifting restrictions    Complete by:  As directed    No lifting for 2 weeks   Resume previous diet    Complete by:  As directed       Discharge Diagnosis:  pvd with bilteral lower extremity rest pain  Secondary Diagnosis: Patient Active Problem List   Diagnosis Date Noted  . PAD (peripheral artery disease) ([place]) 09/05/2016  . Groin pain 04/02/2015  . Aftercare following surgery of the circulatory system, [place] 12/13/2013  . Shingles 10/26/2013  . Depression 10/26/2013  . Acute blood loss anemia 09/05/2013  . Elbow fracture, left 09/05/2013  . Left acetabular fracture ([place]) 09/05/2013  . Ankle fracture, left 09/05/2013  . HTN (hypertension) 09/03/2013  . HLD (hyperlipidemia) 09/03/2013  . Peripheral vascular disease, unspecified 05/10/2012  . Chronic total occlusion of artery of the extremities ([place]) 01/19/2012   Past Medical  History:  Diagnosis Date  . Anemia   . Anginal pain ([place])   . Arthritis    "qwhere" (09/05/2016)  . Chronic lower back pain   . Complication of anesthesia    "takes a long time for it to wear off; I can hallucinate if I take too much" (09/05/2016)  . DVT (deep venous thrombosis) ([place]) 10/2009  . Fall from steps 08/31/2013   Fx. pelvis, Left Hip, Left Elbow  . Fibromyalgia   . GERD (gastroesophageal reflux disease)   . High cholesterol   . History of hiatal hernia   . Hypertension   . Macular degeneration, wet ([place])    "started in right eye; now legally blind in that eye; now started in left eye but pretty much in control" (09/05/2016)  . Osteoporosis   . Peripheral vascular disease ([place])   . Squamous cell carcinoma of skin of right calf Aug. 2015  . TIA (transient ischemic attack)    "several at once; none in a long time" (09/05/2016)       Medication List    TAKE these medications   acetaminophen 500 MG tablet Commonly known as:  TYLENOL Take 1,000 mg by mouth 2 (two) times daily as  needed for moderate pain or headache. Patient took this medication for her pain.   aspirin 81 MG tablet Take 81 mg by mouth daily.   clopidogrel 75 MG tablet Commonly known as:  PLAVIX Take 75 mg by mouth daily.   diazepam 5 MG tablet Commonly known as:  VALIUM Take 2.5 mg by mouth at bedtime as needed for anxiety.   EYE VITAMINS PO Take 1 tablet by mouth daily.   gabapentin 300 MG capsule Commonly known as:  NEURONTIN Take 300 mg by mouth 2 (two) times daily.   loperamide 2 MG capsule Commonly known as:  IMODIUM Take 2 mg by mouth as needed for diarrhea or loose stools.   metoprolol 50 MG tablet Commonly known as:  LOPRESSOR Take 25-50 mg by mouth 2 (two) times daily. Take 50mg  by mouth in the morning and take 25mg  by mouth at bedtime.   traMADol 50 MG tablet Commonly known as:  ULTRAM Take 50 mg by mouth at bedtime as needed for moderate pain.   valsartan-hydrochlorothiazide 160-12.5 MG tablet Commonly known as:  DIOVAN-HCT Take 0.5 tablets by mouth daily.   VITAMIN B-12 PO Take 1 tablet by mouth daily.   Vitamin D3 2000 units Tabs Take 2,000 Units by mouth daily.       Prescriptions given: none  Instructions: 1.  Shower daily with soap and water starting 09/06/16   Disposition home  Patient's condition: is Good  Follow up: 1. Dr. Trula [name] in 4 weeks   Leontine Locket, PA-C [facility] [phone] 09/06/2016  9:39 AM

## 2016-09-06 NOTE — Op Note (Signed)
Patient name: [name] MRN: [id number] DOB: 08-Jan-1928 Sex: female  09/05/2016 Pre-operative Diagnosis: Bilateral rest pain Post-operative diagnosis:  Same Surgeon:  [name] Procedure Performed:  1.  U/s guided access, left femoral artery  2.  Conscious sedation (97 minutes)  3.  Abdominal aortogram  4.  Right lower extremity runoff  5.  Drug coated balloon angioplasty, right superficial femoral and popliteal artery  6.  Stent, right superficial femoral artery    Indications:  The patient is well-known to me, having undergone multiple vascular procedures in the past.  She presented to the office last week with a wound to her right foot pain bilaterally.  Ultrasound identified stenosis within her left leg stents as well as severe disease in her right leg.  She is here today for further evaluation and possible intervention.  Procedure:  The patient was identified in the holding area and taken to room 8.  The patient was then placed supine on the table and prepped and draped in the usual sterile fashion.  A time out was called.  Conscious sedation was performed with the use of IV fentanyl and Versed under continuous physician and nurse monitoring.  Heart rate blood pressure and oxygen saturations were continuously monitored.  Ultrasound was used to evaluate the left common femoral artery.  It was patent .  A digital ultrasound image was acquired.  A micropuncture needle was used to access the right common femoral artery under ultrasound guidance.  An 018 wire was advanced without resistance and a micropuncture sheath was placed.  The 018 wire was removed and a benson wire was placed.  The micropuncture sheath was exchanged for a 5 french sheath.  An omniflush catheter was advanced over the wire to the level of L-1.  An abdominal angiogram was obtained.  Next, using the omniflush catheter and a benson wire, the aortic bifurcation was crossed and the catheter was placed into theright  external iliac artery and right runoff was obtained.    Findings:   Aortogram:  No significant renal artery stenosis was identified.  Aneurysmal degeneration of the infrarenal abdominal aorta.  No significant stenotic lesions in the aortoiliac section  Right Lower Extremity:  Multiple greater than 70% stenotic lesions were identified within the right superficial femoral artery.  The distal superficial femoral and proximal above-knee popliteal artery were occluded.  There is reconstitution of the above-knee popliteal artery.  The popliteal artery is patent throughout it's course.  There is single vessel runoff via the anterior tibial artery which does have mild to moderate calcific disease throughout.  Left Lower Extremity:  Not evaluated given the amount of contrast utilized for intervention  Intervention:  After the above images were acquired the decision was made to proceed with intervention.  Over a 035 wire, a 7 [place] Ansel 1 sheath was advanced into the right external iliac artery.  The patient was fully heparinized.  Using a Glidewire and a quick cross catheter, the stenotic lesions within the superficial femoral artery were successfully crossed.  I then perform subintimal recanalization with the Glidewire and quick cross catheter with successful reentry into the above-knee popliteal artery.  A V 14 wire was then advanced into the anterior tibial artery.  I then performed balloon angioplasty using a 4 x 100 Angiosculpt balloon throughout the superficial femoral artery and most of the above-knee popliteal artery.  Each inflation was for 1 minute and 45 seconds, taking the balloon to nominal pressure.  I then used two  4 x 1 50 and one 4 x 100 drug coated Lutonix balloons to perform balloon angioplasty throughout the superficial femoral and popliteal artery each balloon was taken to nominal pressure and held for 2 minutes.  Completion imaging revealed in-line flow through the superficial femoral and  popliteal artery.  There was a nonflow limiting dissection within the proximal superficial femoral artery as well as throughout the recanalized portion of the popliteal artery.  The size of the artery, made me reluctant to place a stent, however I felt that the superficial femoral artery required stenting and therefore a 6 x 100 ANOVA self-expanding stent was deployed in the proximal superficial femoral artery and molded with a 4 mm balloon.  I then utilized a 4 x 100 balloon and performed a long inflation of the dissection within the popliteal artery taking it to nominal pressure for 3 minutes.  Follow-up imaging revealed excellent in-line flow to superficial femoral artery was no evidence of residual dissection.  The area treated in the popliteal artery also had a significantly improved flow channel with minimal residual dissection.  This was not flow-limiting.  The patient continued to have single vessel runoff via the anterior tibial artery across the ankle.  At this point in time the decision was made to terminate the procedure.  Catheters and wires were removed.  The patient taken the holding area for sheath pull once her platelets profile corrects  Impression:  #1  successful drug coated balloon angioplasty of the right superficial femoral and popliteal artery using a 4 mm balloon.  This was used to treat multiple greater than 70% lesions within superficial femoral artery and an occlusion of the distal superficial femoral and popliteal artery.  #2  residual dissection and the proximal superficial femoral artery successfully treated using a 6 x 100 self-expanding stent.  #3  residual nonflow limiting dissection within the distal superficial femoral and popliteal artery successfully treated using a 4 x 100 balloon to tack the dissection.  #4  single vessel runoff via the anterior tibial artery on the right  #5  the left leg was not evaluated because of contrast utilization.  The patient we brought back  next week for intervention of the left leg   V. [name], M.D. [facility] Office: [phone] Pager:  [phone]

## 2016-09-08 ENCOUNTER — Telehealth: Payer: Self-pay | Admitting: Surgery

## 2016-09-08 ENCOUNTER — Other Ambulatory Visit: Payer: Self-pay

## 2016-09-08 ENCOUNTER — Encounter (HOSPITAL_COMMUNITY): Payer: Self-pay | Admitting: Surgery

## 2016-09-08 MED FILL — Lidocaine HCl Local Preservative Free (PF) Inj 1%: INTRAMUSCULAR | Qty: 30 | Status: AC

## 2016-09-08 MED FILL — Midazolam HCl Inj 2 MG/2ML (Base Equivalent): INTRAMUSCULAR | Qty: 2 | Status: AC

## 2016-09-08 NOTE — Telephone Encounter (Signed)
-----   Message from Mena Goes, RN sent at 09/07/2016 12:52 AM EDT ----- Regarding: 4 weeks   ----- Message ----- From: Gabriel Earing, PA-C Sent: 09/06/2016   9:39 AM To: Vvs Charge Pool  S/p arteriogram 09/05/16.  Fu with [name] in 4 weeks.  Thanks, Aldona Bar

## 2016-09-08 NOTE — Telephone Encounter (Signed)
Spoke to pt about appt, req letter be mailed as well for appt 10/13/16 @ 1:15

## 2016-09-09 ENCOUNTER — Other Ambulatory Visit: Payer: Self-pay | Admitting: *Deleted

## 2016-09-09 ENCOUNTER — Encounter (HOSPITAL_COMMUNITY)
Admission: RE | Disposition: A | Discharge status: Home / Self Care | Payer: Self-pay | Source: Ambulatory Visit | Attending: Surgery

## 2016-09-09 ENCOUNTER — Ambulatory Visit (HOSPITAL_COMMUNITY)
Admission: RE | Admit: 2016-09-09 | Discharge: 2016-09-09 | Disposition: A | Discharge status: Home / Self Care | Payer: Medicare Other | Source: Ambulatory Visit | Attending: Surgery | Admitting: Surgery

## 2016-09-09 ENCOUNTER — Telehealth: Payer: Self-pay | Admitting: Surgery

## 2016-09-09 DIAGNOSIS — Z888 Allergy status to other drugs, medicaments and biological substances status: Secondary | ICD-10-CM | POA: Diagnosis not present

## 2016-09-09 DIAGNOSIS — I771 Stricture of artery: Secondary | ICD-10-CM | POA: Diagnosis not present

## 2016-09-09 DIAGNOSIS — Z79899 Other long term (current) drug therapy: Secondary | ICD-10-CM | POA: Insufficient documentation

## 2016-09-09 DIAGNOSIS — I10 Essential (primary) hypertension: Secondary | ICD-10-CM | POA: Insufficient documentation

## 2016-09-09 DIAGNOSIS — Z7982 Long term (current) use of aspirin: Secondary | ICD-10-CM | POA: Insufficient documentation

## 2016-09-09 DIAGNOSIS — Z8349 Family history of other endocrine, nutritional and metabolic diseases: Secondary | ICD-10-CM | POA: Diagnosis not present

## 2016-09-09 DIAGNOSIS — Z7902 Long term (current) use of antithrombotics/antiplatelets: Secondary | ICD-10-CM | POA: Diagnosis not present

## 2016-09-09 DIAGNOSIS — Z96652 Presence of left artificial knee joint: Secondary | ICD-10-CM | POA: Diagnosis not present

## 2016-09-09 DIAGNOSIS — Z8673 Personal history of transient ischemic attack (TIA), and cerebral infarction without residual deficits: Secondary | ICD-10-CM | POA: Diagnosis not present

## 2016-09-09 DIAGNOSIS — Z01810 Encounter for preprocedural cardiovascular examination: Secondary | ICD-10-CM

## 2016-09-09 DIAGNOSIS — Z833 Family history of diabetes mellitus: Secondary | ICD-10-CM | POA: Diagnosis not present

## 2016-09-09 DIAGNOSIS — Z8249 Family history of ischemic heart disease and other diseases of the circulatory system: Secondary | ICD-10-CM | POA: Insufficient documentation

## 2016-09-09 DIAGNOSIS — M79672 Pain in left foot: Secondary | ICD-10-CM | POA: Insufficient documentation

## 2016-09-09 DIAGNOSIS — Z811 Family history of alcohol abuse and dependence: Secondary | ICD-10-CM | POA: Insufficient documentation

## 2016-09-09 DIAGNOSIS — Z9889 Other specified postprocedural states: Secondary | ICD-10-CM | POA: Diagnosis not present

## 2016-09-09 DIAGNOSIS — Z90710 Acquired absence of both cervix and uterus: Secondary | ICD-10-CM | POA: Diagnosis not present

## 2016-09-09 DIAGNOSIS — I739 Peripheral vascular disease, unspecified: Secondary | ICD-10-CM | POA: Insufficient documentation

## 2016-09-09 DIAGNOSIS — Z86718 Personal history of other venous thrombosis and embolism: Secondary | ICD-10-CM | POA: Insufficient documentation

## 2016-09-09 DIAGNOSIS — M79605 Pain in left leg: Secondary | ICD-10-CM | POA: Diagnosis not present

## 2016-09-09 DIAGNOSIS — Z886 Allergy status to analgesic agent status: Secondary | ICD-10-CM | POA: Insufficient documentation

## 2016-09-09 DIAGNOSIS — Z85828 Personal history of other malignant neoplasm of skin: Secondary | ICD-10-CM | POA: Insufficient documentation

## 2016-09-09 DIAGNOSIS — Z882 Allergy status to sulfonamides status: Secondary | ICD-10-CM | POA: Insufficient documentation

## 2016-09-09 HISTORY — PX: PERIPHERAL VASCULAR CATHETERIZATION: SHX172C

## 2016-09-09 LAB — POCT I-STAT, CHEM 8
BUN: 25 mg/dL — AB (ref 6–20)
CALCIUM ION: 1.22 mmol/L (ref 1.15–1.40)
CHLORIDE: 105 mmol/L (ref 101–111)
CREATININE: 1.1 mg/dL — AB (ref 0.44–1.00)
GLUCOSE: 100 mg/dL — AB (ref 65–99)
HCT: 35 % — ABNORMAL LOW (ref 36.0–46.0)
Hemoglobin: 11.9 g/dL — ABNORMAL LOW (ref 12.0–15.0)
Potassium: 4.6 mmol/L (ref 3.5–5.1)
Sodium: 139 mmol/L (ref 135–145)
TCO2: 27 mmol/L (ref 0–100)

## 2016-09-09 SURGERY — LOWER EXTREMITY ANGIOGRAPHY
Laterality: Left

## 2016-09-09 MED ORDER — OXYCODONE HCL 5 MG PO TABS
ORAL_TABLET | ORAL | Status: AC
  Administered 2016-09-09: 5 mg via ORAL
  Filled 2016-09-09: qty 1

## 2016-09-09 MED ORDER — HEPARIN (PORCINE) IN NACL 2-0.9 UNIT/ML-% IJ SOLN
INTRAMUSCULAR | Status: DC | PRN
  Administered 2016-09-09: 1000 mL

## 2016-09-09 MED ORDER — HEPARIN (PORCINE) IN NACL 2-0.9 UNIT/ML-% IJ SOLN
INTRAMUSCULAR | Status: AC
  Filled 2016-09-09: qty 1000

## 2016-09-09 MED ORDER — LIDOCAINE HCL (PF) 1 % IJ SOLN
INTRAMUSCULAR | Status: DC | PRN
  Administered 2016-09-09: 12 mL

## 2016-09-09 MED ORDER — IODIXANOL 320 MG/ML IV SOLN
INTRAVENOUS | Status: DC | PRN
  Administered 2016-09-09: 75 mL via INTRAVENOUS

## 2016-09-09 MED ORDER — HYDRALAZINE HCL 20 MG/ML IJ SOLN
5.0000 mg | INTRAMUSCULAR | Status: AC | PRN
  Administered 2016-09-09 (×2): 5 mg via INTRAVENOUS

## 2016-09-09 MED ORDER — ONDANSETRON HCL 4 MG/2ML IJ SOLN: 4.0000 mg | Freq: Four times a day (QID) | INTRAMUSCULAR | Status: DC | PRN

## 2016-09-09 MED ORDER — ALUM & MAG HYDROXIDE-SIMETH 200-200-20 MG/5ML PO SUSP: 15.0000 mL | ORAL | Status: DC | PRN

## 2016-09-09 MED ORDER — OXYCODONE HCL 5 MG PO TABS
5.0000 mg | ORAL_TABLET | ORAL | Status: DC | PRN
  Administered 2016-09-09: 5 mg via ORAL

## 2016-09-09 MED ORDER — DOCUSATE SODIUM 100 MG PO CAPS: 100.0000 mg | ORAL_CAPSULE | Freq: Every day | ORAL | Status: DC

## 2016-09-09 MED ORDER — PHENOL 1.4 % MT LIQD: 1.0000 | OROMUCOSAL | Status: DC | PRN

## 2016-09-09 MED ORDER — HYDRALAZINE HCL 20 MG/ML IJ SOLN
INTRAMUSCULAR | Status: AC
  Filled 2016-09-09: qty 1

## 2016-09-09 MED ORDER — GUAIFENESIN-DM 100-10 MG/5ML PO SYRP: 15.0000 mL | ORAL_SOLUTION | ORAL | Status: DC | PRN

## 2016-09-09 MED ORDER — METOPROLOL TARTRATE 5 MG/5ML IV SOLN: 2.0000 mg | INTRAVENOUS | Status: DC | PRN

## 2016-09-09 MED ORDER — LIDOCAINE HCL (PF) 1 % IJ SOLN
INTRAMUSCULAR | Status: AC
  Filled 2016-09-09: qty 30

## 2016-09-09 MED ORDER — MIDAZOLAM HCL 2 MG/2ML IJ SOLN
INTRAMUSCULAR | Status: AC
  Filled 2016-09-09: qty 2

## 2016-09-09 MED ORDER — ACETAMINOPHEN 325 MG PO TABS: 325.0000 mg | ORAL_TABLET | ORAL | Status: DC | PRN

## 2016-09-09 MED ORDER — ACETAMINOPHEN 325 MG RE SUPP: 325.0000 mg | RECTAL | Status: DC | PRN

## 2016-09-09 MED ORDER — FENTANYL CITRATE (PF) 100 MCG/2ML IJ SOLN
INTRAMUSCULAR | Status: DC | PRN
  Administered 2016-09-09 (×2): 50 ug via INTRAVENOUS

## 2016-09-09 MED ORDER — SODIUM CHLORIDE 0.9 % IV SOLN: 1.0000 mL/kg/h | INTRAVENOUS | Status: DC

## 2016-09-09 MED ORDER — MIDAZOLAM HCL 2 MG/2ML IJ SOLN
INTRAMUSCULAR | Status: DC | PRN
  Administered 2016-09-09: 1 mg via INTRAVENOUS

## 2016-09-09 MED ORDER — FENTANYL CITRATE (PF) 100 MCG/2ML IJ SOLN
INTRAMUSCULAR | Status: AC
  Filled 2016-09-09: qty 2

## 2016-09-09 MED ORDER — SODIUM CHLORIDE 0.9 % IV SOLN
INTRAVENOUS | Status: DC
  Administered 2016-09-09: 10:00:00 via INTRAVENOUS

## 2016-09-09 SURGICAL SUPPLY — 8 items
CATH OMNI FLUSH 5F 65CM (CATHETERS) ×2 IMPLANT
KIT MICROINTRODUCER STIFF 5F (SHEATH) ×2 IMPLANT
KIT PV (KITS) ×3 IMPLANT
SHEATH PINNACLE 5F 10CM (SHEATH) ×2 IMPLANT
SYR MEDRAD MARK V 150ML (SYRINGE) ×3 IMPLANT
TRANSDUCER W/STOPCOCK (MISCELLANEOUS) ×3 IMPLANT
TRAY PV CATH (CUSTOM PROCEDURE TRAY) ×3 IMPLANT
WIRE BENTSON .035X145CM (WIRE) ×2 IMPLANT

## 2016-09-09 NOTE — Telephone Encounter (Signed)
Spoke to spouse, req letter be mailed for appt on 11/6

## 2016-09-09 NOTE — Op Note (Signed)
    Patient name: [name] MRN: [id number] DOB: 12-25-1927 Sex: female  09/09/2016 Pre-operative Diagnosis: Left leg pain Post-operative diagnosis:  Same Surgeon:  [name] Procedure Performed:  1.  Ultrasound-guided access, right femoral artery  2.  Second order catheterization  3.  Left leg runoff  4.  Conscious sedation ((23 minutes)    Indications:  Patient has rest pain in her left foot.  She is here today for further evaluation and possible intervention  Procedure:  The patient was identified in the holding area and taken to room 8.  The patient was then placed supine on the table and prepped and draped in the usual sterile fashion.  A time out was called.  Conscious sedation was performed with the use of IV fentanyl and Versed under continuous physician and nurse monitoring.  Heart rate, blood pressure, and oxygen saturations were continuously monitored.  Ultrasound was used to evaluate the right common femoral artery.  It was patent .  A digital ultrasound image was acquired.  A micropuncture needle was used to access the right common femoral artery under ultrasound guidance.  An 018 wire was advanced without resistance and a micropuncture sheath was placed.  The 018 wire was removed and a benson wire was placed.  The micropuncture sheath was exchanged for a 5 french sheath.  An omniflush catheter was used with a Benson wire to get the catheter into the left external iliac artery and a left leg runoff was performed  Findings:   Aortogram:  Not evaluated  Right Lower Extremity:  Not evaluated  Left Lower Extremity:  The left common femoral and profunda femoral artery are patent.  The superficial femoral artery is occluded at its origin.  There is reconstitution of the stented left above-knee femoral-popliteal bypass graft which is patent down to the below-knee popliteal artery with single-vessel runoff via the anterior tibial artery  Intervention:  No intervention was  performed given the single-vessel runoff in the diffuse disease with occlusion proximally.  Impression:  #1  occluded left superficial femoral artery.  The left above-knee popliteal bypass graft remains patent via collaterals  #2  consideration for a redo surgical bypass from the left groin to the above-knee bypass    V. [name], M.D. [facility] [place] Office: [phone] Pager:  [phone]

## 2016-09-09 NOTE — Interval H&P Note (Signed)
History and Physical Interval Note:  09/09/2016 10:42 AM  [name]  has presented today for surgery, with the diagnosis of pvd with bilaterial lower extremity rest pain  The various methods of treatment have been discussed with the patient and family. After consideration of risks, benefits and other options for treatment, the patient has consented to  Procedure(s): Lower Extremity Angiography (Left) as a surgical intervention .  The patient's history has been reviewed, patient examined, no change in status, stable for surgery.  I have reviewed the patient's chart and labs.  Questions were answered to the patient's satisfaction.     Glendora Clouatre, [place]  Back today for left leg intervention.  [name]

## 2016-09-09 NOTE — H&P (View-Only) (Signed)
[facility] [place]   CC: Follow up peripheral artery occlusive disease  History of Present Illness [name] is a 82 y.o. female patient of [name] who initially presented in October 2010 with ischemic changes to her left foot. She underwent distal left superficial femoral to below knee popliteal artery bypass graft with reversed ipsilateral greater saphenous vein she did have issues with wound healing which ultimately resolved. She developed a high-grade stenosis within the popliteal artery above the proximal anastomosis which was stented in 2011. She was also found to have an elevated velocity and on 12/11/2010 and in-stent stenosis was re\re stented using a 6 x 30 EV3 stents.   She then had an ultrasound that showed native superficial femoral artery stenosis proximal to her stents. Therefore on 01/05/2015 she underwent angiography. At that time, she had stenting of her left external iliac stenosis with a 7 x 30 self-expanding stent as well as stenting of her left superficial femoral artery using a 6 x 30 self-expanding stent. The lesion in the superficial femoral artery was initially treated with a drug coated balloon angioplasty which was complicated by non-flow limiting dissection requiring stenting the bypass graft was found to be widely patent with two-vessel runoff via the peroneal and anterior tibial artery.  She developed velocity elevations again and in December 2016 underwent angiography and stenting as well as in-stent stenosis angioplasty of her left superficial femoral-popliteal artery.  She again had velocity elevations recently and on 04/01/2016 [name] performed atherectomy with a jetstream device and drug coated balloon angioplasty. She has difficulty with walking.  This likely stems from her hip.  Hip replacement surgery has been recommended.    She returns today with c/o increased pain to both lower legs and on the third toe of her right  foot there is a dark spot since she dropped something on her toe about a month ago; there was an open wound at the dorsal aspect of her right 3rd toe MIP joint, but this healed after her PCP obtained a culture of this on 08/28/16.  The patient stated that she had seen her PCP 08/28/16 and he had this toe x-rayed.  Patient is c/o numbness in both feet.  A red area developed recently at the left mid calf, medial aspect.  Both feet are tender to touch. Both calves and feet are numb.  Pain in feet and calves keep her awake at night. Her PCP prescribed tramadol and gabapentin which have helped a little.  Around Thanksgiving 2016 she developed bronchitis, treated by her PCP for this and feels improved.  [place] October 2014 and fractured left pelvis, had left elbow replacement, fractured left ankle and wrist.  She needs a left hip replacement but does not want to have that done at this point.  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   Pt Diabetic: No Pt smoker: non-smoker  Pt meds include: Statin :No, statins cause aching Betablocker: Yes ASA: Yes Other anticoagulants/antiplatelets: Plavix    Past Medical History:  Diagnosis Date  . Cancer ([place]) Aug. 2015   SCC - Right Lower Leg  . DVT (deep venous thrombosis) ([place])   . Fall Oct. 15, 2014   Fx. pelvis, Left Hip, Left Elbow  . History of angina   . Hypertension   . Peripheral vascular disease ([place])   . Stroke [facility])    History of TIA  Social History Social History  Substance Use Topics  . Smoking status: Former Smoker    Types: Cigarettes    Quit date: 11/17/1946  . Smokeless tobacco: Never Used  . Alcohol use No    Family History Family History  Problem Relation Age of Onset  . Heart disease Father     Heart Disease before age 26  . Hyperlipidemia Father   . Hypertension Father   . Alcohol abuse Father   . Heart disease  Brother   . Hyperlipidemia Brother   . Hypertension Brother   . Deep vein thrombosis Brother   . Heart disease Son     Heart Disease before age 64  . Hyperlipidemia Son   . Hypertension Son   . Heart attack Son   . Diabetes Son   . Hypertension Son   . Hyperlipidemia Sister   . Hypertension Sister     Past Surgical History:  Procedure Laterality Date  . ABDOMINAL AORTAGRAM N/A 12/26/2014   Procedure: ABDOMINAL Maxcine Ham;  Surgeon: Serafina Mitchell, MD;  Location: [facility] CATH LAB;  Service: Cardiovascular;  Laterality: N/A;  . ABDOMINAL HYSTERECTOMY    . FEMORAL ARTERY STENT  12-11-10   Left SFA  . INSERTION OF ILIAC STENT Left 12/26/2014   Procedure: INSERTION OF ILIAC STENT;  Surgeon: Serafina Mitchell, MD;  Location: [facility] CATH LAB;  Service: Cardiovascular;  Laterality: Left;  . JOINT REPLACEMENT     knee  . JOINT REPLACEMENT Left Oct. 17, 2014   Elbow ( pt fell 08-31-13 )  . PERIPHERAL VASCULAR CATHETERIZATION N/A 10/30/2015   Procedure: Abdominal Aortogram w/Lower Extremity;  Surgeon: Serafina Mitchell, MD;  Location: [facility] CV LAB;  Service: Cardiovascular;  Laterality: N/A;  . PERIPHERAL VASCULAR CATHETERIZATION  10/30/2015   Procedure: Peripheral Vascular Intervention;  Surgeon: Serafina Mitchell, MD;  Location: [place] CV LAB;  Service: Cardiovascular;;  . PERIPHERAL VASCULAR CATHETERIZATION N/A 04/01/2016   Procedure: Abdominal Aortogram w/Lower Extremity;  Surgeon: Serafina Mitchell, MD;  Location: [facility] CV LAB;  Service: Cardiovascular;  Laterality: N/A;  . PERIPHERAL VASCULAR CATHETERIZATION Left 04/01/2016   Procedure: Peripheral Vascular Atherectomy;  Surgeon: Serafina Mitchell, MD;  Location: [place] CV LAB;  Service: Cardiovascular;  Laterality: Left;  Superficial femoral artery.  Marland Kitchen PR VEIN BYPASS GRAFT,AORTO-FEM-POP  09-13-09   Left Fem-pop  . TOTAL ELBOW ARTHROPLASTY Left 09/03/2013   Procedure: LEFT TOTAL ELBOW ARTHROPLASTY;  Surgeon: Roseanne Kaufman, MD;   Location: [facility];  Service: Orthopedics;  Laterality: Left;    Allergies  Allergen Reactions  . Motrin [Ibuprofen] Other (See Comments)    GI bleeding  . Statins Other (See Comments)    Pain and muscle weakness  . Promethazine Hcl Other (See Comments)    IV  Drug only, makes her act crazy  . Sulfa Antibiotics Other (See Comments)    Unknown    Current Outpatient Prescriptions  Medication Sig Dispense Refill  . acetaminophen (TYLENOL) 500 MG tablet Take 1,000 mg by mouth 2 (two) times daily as needed for moderate pain or headache. Patient took this medication for her pain.    Marland Kitchen aspirin 81 MG tablet Take 81 mg by mouth daily.    . Cholecalciferol (VITAMIN D3) 2000 UNITS TABS Take 2,000 Units by mouth daily.    . clopidogrel (PLAVIX) 75 MG tablet Take 75 mg by mouth daily.    . diazepam (VALIUM) 5 MG tablet Take 2.5 mg by mouth at bedtime as needed for  anxiety.     . gabapentin (NEURONTIN) 300 MG capsule     . metoprolol (LOPRESSOR) 50 MG tablet Take 25-50 mg by mouth 2 (two) times daily. Take 50mg  by mouth in the morning and take 25mg  by mouth at bedtime.    . Multiple Vitamins-Minerals (EYE VITAMINS PO) Take 1 tablet by mouth daily.     . traMADol (ULTRAM) 50 MG tablet Take by mouth.    . valsartan-hydrochlorothiazide (DIOVAN-HCT) 160-12.5 MG per tablet Take 0.5 tablets by mouth daily.     No current facility-administered medications for this visit.     ROS: See HPI for pertinent positives and negatives.   Physical Examination  Vitals:   09/01/16 1346  BP: (!) 159/76  Pulse: 66  Resp: 14  Temp: 97.6 F (36.4 C)  TempSrc: Oral  SpO2: 99%  Weight: 130 lb (59 kg)  Height: 5\' 2"  (1.575 m)   Body mass index is 23.78 kg/m.  General:A&O x 3, WDWN  Gait: using walker, slow and deliberate Eyes: Pupils are equal Pulmonary: Respirations are non labored, CTAB, no wheezes, rhonchi, or rales.  Cardiac: regular rhythm and rate, no detected murmur     Carotid  Bruits Left Right   negative negative  Aorta: is not palpable Radial pulses: are 2+ palpable and =   VASCULAR EXAM: Extremities with ischemic changes: darkened area at dorsal aspect right 3rd toe mid phalangeal joint,  without Gangrene; without open wounds. Plantar aspect of left foot is mottled, slightly cyanotic. Faint red area of left medial calf that is tender but no swelling.     LE Pulses LEFT RIGHT   FEMORAL 1+ palpable  1+ palpable    POPLITEAL not palpable  not palpable   POSTERIOR TIBIAL  not palpable, faint Doppler signal   Not palpable, no Doppler signal    DORSALIS PEDIS  ANTERIOR TIBIAL not palpable, faint Doppler signal  not palpable, faint Doppler signal        PERONEAL  Not palpable, no Doppler signal Not palpable, no Doppler signal   Abdomen: soft, NT, no palpable masses.  Skin: no rashes, see Extremities. Musculoskeletal: no muscle wasting or atrophy. Neurologic: A&O X 3; Appropriate Affect; MOTOR FUNCTION: moving all extremities equally, motor strength 3/5 throughout. Speech is fluent/normal.  CN 2-12 intact.   ASSESSMENT: [name] is a 82 y.o. female who is s/p left distal superficial femoral to popliteal artery bypass graft 09/13/2009, left superficial femoral artery stents placed 04/25/2010 &12/11/2010; left external iliac artery stent, left superficial femoral artery angioplasty 12/26/2014, angioplasty of in-stent stenosis within the proximal above-knee popliteal anastomosis and stent placement in left SFA on 10/30/15 .  She again had velocity elevations and on 04/01/2016 [name] performed atherectomy of left SFA with a jetstream device and drug coated balloon  angioplasty.  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  No TIA since 2000.  Pt returns today with c/o pain in both lower legs that keeps her awake at night.  She reports severe pain in both calves with walking a short distance.  Also reports numbness in both lower legs and feet.  There is a darkened area at the dorsal aspect of her right 3rd toe that does not seem to be improving since she dropped something on that toe a month ago. X-ray result from 08/28/16 shows no fracture, no evidence of osteomyelitis in the right toes.  Plantar aspect  pf left foot is mottled and slightly cyanotic.   Left calf with new red tender area at medial aspect, negative for DVT today.    I discussed pt HPI and physical exam results with [name] who spoke with pt and two family members with her. He also examined pt.   Face to face time with patient was 25 minutes. Over 50% of this time was spent on counseling and coordination of care.   DATA  Right 3rd toe x-ray result from 08/28/16: FINDINGS: No evidence of fracture, dislocation, degenerative change or any sign of osteomyelitis there is an old healed fracture of the fifth metatarsal. IMPRESSION: No abnormality seen of the third digit. Old healed fracture of the fifth metatarsal.  03/19/16 left LE arterial duplex indicates 50 - 74% left proximal  native superficial femoral artery stenosis. Greater than 75% stenosis in the left proximal thigh stent at origin (589 cm/s) and proximal (437 cm/s) stent. The distal left SFA to popliteal stent appears patent with no stenosis visualize.  03/19/16 carotid duplex suggests less than 40% bilateral internal carotid artery stenosis.  DVT duplex of left LE today is negative for DVT.   PLAN:  Based on the patient's vascular studies and examination, pt will be scheduled for arteriogram with bilateral run off, possible  intervention, tomorrow by [name].   I discussed in depth with the patient the nature of atherosclerosis, and emphasized the importance of maximal medical management including strict control of blood pressure, blood glucose, and lipid levels, obtaining regular exercise, and continued cessation of smoking.  The patient is aware that without maximal medical management the underlying atherosclerotic disease process will progress, limiting the benefit of any interventions.  The patient was given information about PAD including signs, symptoms, treatment, what symptoms should prompt the patient to seek immediate medical care, and risk reduction measures to take.  Clemon Chambers, RN, MSN, FNP-C [facility] Phone: [phone]  Clinic MD: Trula Slade  09/01/16 1:56 PM

## 2016-09-09 NOTE — Progress Notes (Signed)
Removal of Right arterial femoral sheath without complications. manual compression for 30 minutes. Distal pulses unchanged form prior assessment with doppler. BP: 195 / 59, HR: 63, Sp02: 98. Sterile dressing applied. Elasticplast dressing was applied for additional pressure. Dressing dry and intact. patient received post care instructions and understand recovery process.

## 2016-09-09 NOTE — Telephone Encounter (Signed)
-----   Message from Mena Goes, RN sent at 09/09/2016 12:35 PM EDT ----- Regarding: schedule   ----- Message ----- From: Serafina Mitchell, MD Sent: 09/09/2016  11:16 AM To: Vvs Charge Pool  09/09/2016:  Surgeon:  [name] Procedure Performed:  1.  Ultrasound-guided access, right femoral artery  2.  Second order catheterization  3.  Left leg runoff  4.  Conscious sedation ((23 minutes)   Follow-up 1-2 weeks with vein mapping

## 2016-09-09 NOTE — Discharge Instructions (Signed)
Angiogram, Care After °Refer to this sheet in the next few weeks. These instructions provide you with information about caring for yourself after your procedure. Your health care provider may also give you more specific instructions. Your treatment has been planned according to current medical practices, but problems sometimes occur. Call your health care provider if you have any problems or questions after your procedure. °WHAT TO EXPECT AFTER THE PROCEDURE °After your procedure, it is typical to have the following: °· Bruising at the catheter insertion site that usually fades within 1-2 weeks. °· Blood collecting in the tissue (hematoma) that may be painful to the touch. It should usually decrease in size and tenderness within 1-2 weeks. °HOME CARE INSTRUCTIONS °· Take medicines only as directed by your health care provider. °· You may shower 24-48 hours after the procedure or as directed by your health care provider. Remove the bandage (dressing) and gently wash the site with plain soap and water. Pat the area dry with a clean towel. Do not rub the site, because this may cause bleeding. °· Do not take baths, swim, or use a hot tub until your health care provider approves. °· Check your insertion site every day for redness, swelling, or drainage. °· Do not apply powder or lotion to the site. °· Do not lift over 10 lb (4.5 kg) for 5 days after your procedure or as directed by your health care provider. °· Ask your health care provider when it is okay to: °¨ Return to work or school. °¨ Resume usual physical activities or sports. °¨ Resume sexual activity. °· Do not drive home if you are discharged the same day as the procedure. Have someone else drive you. °· You may drive 24 hours after the procedure unless otherwise instructed by your health care provider. °· Do not operate machinery or power tools for 24 hours after the procedure or as directed by your health care provider. °· If your procedure was done as an  outpatient procedure, which means that you went home the same day as your procedure, a responsible adult should be with you for the first 24 hours after you arrive home. °· Keep all follow-up visits as directed by your health care provider. This is important. °SEEK MEDICAL CARE IF: °· You have a fever. °· You have chills. °· You have increased bleeding from the catheter insertion site. Hold pressure on the site. °SEEK IMMEDIATE MEDICAL CARE IF: °· You have unusual pain at the catheter insertion site. °· You have redness, warmth, or swelling at the catheter insertion site. °· You have drainage (other than a small amount of blood on the dressing) from the catheter insertion site. °· The catheter insertion site is bleeding, and the bleeding does not stop after 30 minutes of holding steady pressure on the site. °· The area near or just beyond the catheter insertion site becomes pale, cool, tingly, or numb. °  °This information is not intended to replace advice given to you by your health care provider. Make sure you discuss any questions you have with your health care provider. °  °Document Released: 05/22/2005 Document Revised: 11/24/2014 Document Reviewed: 04/06/2013 °Elsevier Interactive Patient Education ©2016 [employer] Inc. ° °

## 2016-09-10 ENCOUNTER — Encounter (HOSPITAL_COMMUNITY): Payer: Self-pay | Admitting: Surgery

## 2016-09-12 ENCOUNTER — Telehealth: Payer: Self-pay | Admitting: *Deleted

## 2016-09-12 NOTE — Telephone Encounter (Signed)
Daughter called stating leg pain was much worse 09/11/16 afternoon.  Patient was requesting Dr Trula Slade to call and discuss arteriogram/surgery, Dr Trula Slade was in surgery.  I paged Dr Trula Slade 09/12/16 he instructed patient to come to office 09/15/16 for office visit unless worsens over the week end then go to ED.  Daughter voiced understanding of the instructions

## 2016-09-15 ENCOUNTER — Ambulatory Visit (INDEPENDENT_AMBULATORY_CARE_PROVIDER_SITE_OTHER): Payer: Medicare Other | Admitting: Surgery

## 2016-09-15 ENCOUNTER — Other Ambulatory Visit: Payer: Self-pay | Admitting: *Deleted

## 2016-09-15 ENCOUNTER — Encounter: Payer: Self-pay | Admitting: Surgery

## 2016-09-15 ENCOUNTER — Other Ambulatory Visit: Payer: Self-pay

## 2016-09-15 VITALS — BP 170/74 | HR 62 | Temp 97.0°F | Resp 14 | Ht 62.0 in | Wt 128.0 lb

## 2016-09-15 DIAGNOSIS — I70229 Atherosclerosis of native arteries of extremities with rest pain, unspecified extremity: Secondary | ICD-10-CM

## 2016-09-15 DIAGNOSIS — I209 Angina pectoris, unspecified: Secondary | ICD-10-CM | POA: Diagnosis not present

## 2016-09-15 DIAGNOSIS — I779 Disorder of arteries and arterioles, unspecified: Secondary | ICD-10-CM

## 2016-09-15 DIAGNOSIS — Z01810 Encounter for preprocedural cardiovascular examination: Secondary | ICD-10-CM | POA: Diagnosis not present

## 2016-09-15 DIAGNOSIS — I70213 Atherosclerosis of native arteries of extremities with intermittent claudication, bilateral legs: Secondary | ICD-10-CM

## 2016-09-15 DIAGNOSIS — I7092 Chronic total occlusion of artery of the extremities: Secondary | ICD-10-CM

## 2016-09-15 DIAGNOSIS — I739 Peripheral vascular disease, unspecified: Secondary | ICD-10-CM

## 2016-09-15 MED ORDER — HYDROCODONE-ACETAMINOPHEN 5-325 MG PO TABS: 1.0000 | ORAL_TABLET | Freq: Four times a day (QID) | ORAL | 0 refills | Status: DC | PRN

## 2016-09-15 NOTE — Progress Notes (Signed)
Vascular and Vein Specialist of   Patient name: [name] MRN: [id number] DOB: 01-Aug-1928 Sex: female  REASON FOR VISIT: follow up  HPI: The patient is back today for followup. She initially presented in October 2010 with ischemic changes to her left foot. She underwent distal left superficial femoral to below knee popliteal artery bypass graft with reversed ipsilateral greater saphenous vein she did have issues with wound healing which ultimately resolved. She developed a high-grade stenosis within the popliteal artery above the proximal anastomosis which was stented in 2011. She was also found to have an elevated velocity and on 12/11/2010 and in-stent stenosis was re\re stented using a 6 x 30 EV3 stents.   She recently had an ultrasound that showed native superficial femoral artery stenosis proximal to her stents. Therefore on 01/05/2015 she underwent angiography. At that time, she had stenting of her left external iliac stenosis with a 7 x 30 self-expanding stent as well as stenting of her left superficial femoral artery using a 6 x 30 self-expanding stent. The lesion in the superficial femoral artery was initially treated with a drug coated balloon angioplasty which was complicated by non-flow limiting dissection requiring stenting the bypass graft was found to be widely patent with two-vessel runoff via the peroneal and anterior tibial artery.  She developed velocity elevations again and in December 2016 underwent angiography and stenting as well as in-stent stenosis angioplasty of her left superficial femoral-popliteal artery.  She again had velocity elevations recently and on 04/01/2016 I performed atherectomy with a jetstream device and drug coated balloon angioplasty.   She recently presented with bilateral rest pain.  She went back to the cath lab for angiogram on 09/05/2016 and had drug coated balloon angioplasty and stenting of her  right superficial femoral artery.  This was done for occlusion.  She was then brought back for evaluation of her left leg.  She was found to have occlusion of her native superficial femoral artery above her femoral-popliteal bypass graft which remained patent.    Past Medical History:  Diagnosis Date  . Anemia   . Anginal pain ([place])   . Arthritis    "qwhere" (09/05/2016)  . Chronic lower back pain   . Complication of anesthesia    "takes a long time for it to wear off; I can hallucinate if I take too much" (09/05/2016)  . DVT (deep venous thrombosis) ([place]) 10/2009  . Fall from steps 08/31/2013   Fx. pelvis, Left Hip, Left Elbow  . Fibromyalgia   . GERD (gastroesophageal reflux disease)   . High cholesterol   . History of hiatal hernia   . Hypertension   . Macular degeneration, wet ([place])    "started in right eye; now legally blind in that eye; now started in left eye but pretty much in control" (09/05/2016)  . Osteoporosis   . Peripheral vascular disease ([place])   . Squamous cell carcinoma of skin of right calf Aug. 2015  . TIA (transient ischemic attack)    "several at once; none in a long time" (09/05/2016)    Family History  Problem Relation Age of Onset  . Heart disease Father     Heart Disease before age 70  . Hyperlipidemia Father   . Hypertension Father   . Alcohol abuse Father   . Heart disease Brother   . Hyperlipidemia Brother   . Hypertension Brother   . Deep vein thrombosis Brother   . Heart disease Son     Heart  Disease before age 60  . Hyperlipidemia Son   . Hypertension Son   . Heart attack Son   . Diabetes Son   . Hypertension Son   . Hyperlipidemia Sister   . Hypertension Sister     SOCIAL HISTORY: Social History  Substance Use Topics  . Smoking status: Former Smoker    Types: Cigarettes    Quit date: 11/17/1946  . Smokeless tobacco: Never Used  . Alcohol use No    Allergies  Allergen Reactions  . Motrin [Ibuprofen] Other (See Comments)     GI bleeding  . Statins Other (See Comments)    Pain and muscle weakness  . Promethazine Hcl Other (See Comments)    IV  Drug only, makes her act crazy  . Sulfa Antibiotics Nausea And Vomiting    Current Outpatient Prescriptions  Medication Sig Dispense Refill  . acetaminophen (TYLENOL) 500 MG tablet Take 1,000 mg by mouth 2 (two) times daily as needed for moderate pain or headache. Patient took this medication for her pain.    Marland Kitchen aspirin 81 MG tablet Take 81 mg by mouth daily.    . Cholecalciferol (VITAMIN D3) 2000 UNITS TABS Take 2,000 Units by mouth at bedtime.     . clopidogrel (PLAVIX) 75 MG tablet Take 75 mg by mouth daily.    . Cyanocobalamin (VITAMIN B-12 PO) Take 1 tablet by mouth daily.    . diazepam (VALIUM) 5 MG tablet Take 2.5 mg by mouth at bedtime as needed for anxiety.     . gabapentin (NEURONTIN) 300 MG capsule Take 300 mg by mouth 2 (two) times daily.     Marland Kitchen loperamide (IMODIUM) 2 MG capsule Take 2 mg by mouth as needed for diarrhea or loose stools.    . metoprolol (LOPRESSOR) 50 MG tablet Take 25-50 mg by mouth 2 (two) times daily. Take 50mg  by mouth in the morning and take 25mg  by mouth at bedtime.    . Multiple Vitamins-Minerals (EYE VITAMINS PO) Take 1 tablet by mouth at bedtime.     . traMADol (ULTRAM) 50 MG tablet Take 50 mg by mouth at bedtime as needed for moderate pain.     . valsartan-hydrochlorothiazide (DIOVAN-HCT) 160-12.5 MG per tablet Take 0.5 tablets by mouth daily.     No current facility-administered medications for this visit.     REVIEW OF SYSTEMS:  [X]  denotes positive finding, [ ]  denotes negative finding Cardiac  Comments:  Chest pain or chest pressure:    Shortness of breath upon exertion:    Short of breath when lying flat:    Irregular heart rhythm:        Vascular    Pain in calf, thigh, or hip brought on by ambulation: x   Pain in feet at night that wakes you up from your sleep:  x   Blood clot in your veins:    Leg swelling:  x         Pulmonary    Oxygen at home:    Productive cough:     Wheezing:         Neurologic    Sudden weakness in arms or legs:     Sudden numbness in arms or legs:     Sudden onset of difficulty speaking or slurred speech:    Temporary loss of vision in one eye:     Problems with dizziness:         Gastrointestinal    Blood in stool:  Vomited blood:         Genitourinary    Burning when urinating:     Blood in urine:        Psychiatric    Major depression:         Hematologic    Bleeding problems:    Problems with blood clotting too easily:        Skin    Rashes or ulcers:        Constitutional    Fever or chills:      PHYSICAL EXAM: Vitals:   09/15/16 1243  BP: (!) 170/74  Pulse: 62  Resp: 14  Temp: 97 F (36.1 C)  TempSrc: Oral  SpO2: 94%  Weight: 128 lb (58.1 kg)  Height: 5\' 2"  (1.575 m)    GENERAL: The patient is a well-nourished female, in no acute distress. The vital signs are documented above. CARDIAC: There is a regular rate and rhythm.  VASCULAR: Nonpalpable pulses in the left leg PULMONARY: There is good air exchange bilaterally without wheezing or rales. ABDOMEN: Soft and non-tender with normal pitched bowel sounds.  MUSCULOSKELETAL: There are no major deformities or cyanosis. NEUROLOGIC: No focal weakness or paresthesias are detected. SKIN: There are no ulcers or rashes noted. PSYCHIATRIC: The patient has a normal affect.  DATA:  None  MEDICAL ISSUES: The patient is suffering from bilateral rest pain which keeps her up most nights.  Her symptoms have improved on the right after percutaneous intervention.  She is here today to discuss options for management of the pain in her left leg.  We described 3 options.  The first of the pain control and non-operative treatment.  The second would be surgical bypass, and the third would be amputation.  After lengthy discussion with the patient and her family, we have elected to proceed with a left  femoral-popliteal bypass graft.  I doubt that she has any vein remaining in the left leg, therefore we would do this with Gore-Tex.  It is been scheduled for Wednesday, November 8.  She will stop her Plavix 5 days before the operation.  She is going to get cardiac clearance.    [name], MD V[facility] and Vein Specialists of [facility] [phone] Pager [phone]

## 2016-09-16 ENCOUNTER — Other Ambulatory Visit: Payer: Self-pay | Admitting: *Deleted

## 2016-09-16 DIAGNOSIS — Z01810 Encounter for preprocedural cardiovascular examination: Secondary | ICD-10-CM

## 2016-09-16 NOTE — Addendum Note (Signed)
Addended by: Mena Goes on: 09/16/2016 10:10 AM   Modules accepted: Orders

## 2016-09-17 DIAGNOSIS — I4891 Unspecified atrial fibrillation: Secondary | ICD-10-CM

## 2016-09-17 HISTORY — DX: Unspecified atrial fibrillation: I48.91

## 2016-09-19 ENCOUNTER — Encounter (HOSPITAL_COMMUNITY)
Admission: RE | Admit: 2016-09-19 | Discharge: 2016-09-19 | Disposition: A | Discharge status: Home / Self Care | Payer: Medicare Other | Source: Ambulatory Visit | Attending: Surgery | Admitting: Surgery

## 2016-09-19 DIAGNOSIS — Z01810 Encounter for preprocedural cardiovascular examination: Secondary | ICD-10-CM | POA: Diagnosis not present

## 2016-09-19 DIAGNOSIS — Z01818 Encounter for other preprocedural examination: Secondary | ICD-10-CM | POA: Diagnosis not present

## 2016-09-19 LAB — NM MYOCAR MULTI W/SPECT W/WALL MOTION / EF
CHL CUP RESTING HR STRESS: 75 {beats}/min
CSEPED: 5 min
CSEPEW: 1 METS
Exercise duration (sec): 20 s
Peak HR: 105 {beats}/min

## 2016-09-19 MED ORDER — REGADENOSON 0.4 MG/5ML IV SOLN
INTRAVENOUS | Status: AC
  Filled 2016-09-19: qty 5

## 2016-09-19 MED ORDER — REGADENOSON 0.4 MG/5ML IV SOLN
0.4000 mg | Freq: Once | INTRAVENOUS | Status: AC
  Administered 2016-09-19: 0.4 mg via INTRAVENOUS

## 2016-09-19 MED ORDER — TECHNETIUM TC 99M TETROFOSMIN IV KIT
30.0000 | PACK | Freq: Once | INTRAVENOUS | Status: AC | PRN
  Administered 2016-09-19: 30 via INTRAVENOUS

## 2016-09-19 MED ORDER — TECHNETIUM TC 99M TETROFOSMIN IV KIT
10.0000 | PACK | Freq: Once | INTRAVENOUS | Status: AC | PRN
  Administered 2016-09-19: 10 via INTRAVENOUS

## 2016-09-19 NOTE — Progress Notes (Addendum)
Patient presented for outpatient Lexiscan prior to cardiac clearance.  Tolerated procedure well. Pending final stress imaging result.   The patient was hypertensive prior to test 206/72 however did not took her all AM medication. She did bring this with her and took after stress test.

## 2016-09-21 NOTE — Progress Notes (Signed)
Cardiology Office Note  NEW PATIENT VISIT Date:  09/22/2016   ID:  [name], DOB 05/05/1928, MRN [id number]  PCP:  Henrine Screws, MD  Cardiologist:  NEW Dr. Angelena Form    Chief Complaint  Patient presents with  . Pre-op Exam      History of Present Illness: [name] is a 82 y.o. female who presents for pre-op clearance for a left femoral-popliteal bypass graft..  Planned for Wed.    She has a hx of PAD dating to 08/2009 with  distal left superficial femoral to below knee popliteal artery bypass graft with reversed ipsilateral greater saphenous vein she did have issues with wound healing which ultimately resolved. She developed a high-grade stenosis within the popliteal artery above the proximal anastomosis which was stented in 2011. She was also found to have an elevated velocity and on 12/11/2010 and in-stent stenosis was re\re stented using a 6 x 30 EV3 stents.   She recently had an ultrasound that showed native superficial femoral artery stenosis proximal to her stents. Therefore on 01/05/2015 she underwent angiography. At that time, she had stenting of her left external iliac stenosis with a 7 x 30 self-expanding stent as well as stenting of her left superficial femoral artery using a 6 x 30 self-expanding stent. The lesion in the superficial femoral artery was initially treated with a drug coated balloon angioplasty which was complicated by non-flow limiting dissection requiring stenting the bypass graft was found to be widely patent with two-vessel runoff via the peroneal and anterior tibial artery.  She developed velocity elevations again and in December 2016 underwent angiography and stenting as well as in-stent stenosis angioplasty of her left superficial femoral-popliteal artery. She again had velocity elevations recently and on 04/01/2016 I performed atherectomy with a jetstream device and drug coated balloon angioplasty.   She recently presented  with bilateral rest pain of legs.  She went back to the cath lab for angiogram on 09/05/2016 and had drug coated balloon angioplasty and stenting of her right superficial femoral artery.  This was done for occlusion.  She was then brought back for evaluation of her left leg.  She was found to have occlusion of her native superficial femoral artery above her femoral-popliteal bypass graft which remained patent.  [name] would like to proceed with a left femoral-popliteal bypass graft.  It is been scheduled for Wednesday, November 8.  She will stop her Plavix 5 days before the operation.    She had nuc study Lexiscan myoview on the 3rd of Nov.  It is negative for ischemia.  She has no chest pain and SOB with exertion but she does not do much.  occ flutters but very brief.  +FH of CAD.  She cannot take statins due to her intolerance.   Past Medical History:  Diagnosis Date  . Anemia   . Anginal pain ([place])   . Arthritis    "qwhere" (09/05/2016)  . Chronic lower back pain   . Complication of anesthesia    "takes a long time for it to wear off; I can hallucinate if I take too much" (09/05/2016)  . DVT (deep venous thrombosis) ([place]) 10/2009  . Fall from steps 08/31/2013   Fx. pelvis, Left Hip, Left Elbow  . Fibromyalgia   . GERD (gastroesophageal reflux disease)   . High cholesterol   . History of hiatal hernia   . Hypertension   . Macular degeneration, wet ([place])    "started in right eye;  now legally blind in that eye; now started in left eye but pretty much in control" (09/05/2016)  . Osteoporosis   . Peripheral vascular disease ([place])   . Squamous cell carcinoma of skin of right calf Aug. 2015  . TIA (transient ischemic attack)    "several at once; none in a long time" (09/05/2016)    Past Surgical History:  Procedure Laterality Date  . ABDOMINAL AORTAGRAM N/A 12/26/2014   Procedure: ABDOMINAL Maxcine Ham;  Surgeon: Serafina Mitchell, MD;  Location: [facility] CATH LAB;  Service: Cardiovascular;   Laterality: N/A;  . CARPAL TUNNEL RELEASE Right   . CATARACT EXTRACTION W/ INTRAOCULAR LENS  IMPLANT, BILATERAL Bilateral   . DILATION AND CURETTAGE OF UTERUS    . EYE SURGERY Right    "macular OR"  . FEMORAL ARTERY STENT  12-11-10   Left SFA  . INCISION AND DRAINAGE OF WOUND Left 10/25/2009   leg/notes 11/13/2009  . INSERTION OF ILIAC STENT Left 12/26/2014   Procedure: INSERTION OF ILIAC STENT;  Surgeon: Serafina Mitchell, MD;  Location: [facility] CATH LAB;  Service: Cardiovascular;  Laterality: Left;  . JOINT REPLACEMENT     knee  . JOINT REPLACEMENT Left Oct. 17, 2014   Elbow ( pt fell 08-31-13 )  . ORIF SHOULDER FRACTURE Right    "it was crushed"  . PERIPHERAL VASCULAR CATHETERIZATION N/A 10/30/2015   Procedure: Abdominal Aortogram w/Lower Extremity;  Surgeon: Serafina Mitchell, MD;  Location: [place] CV LAB;  Service: Cardiovascular;  Laterality: N/A;  . PERIPHERAL VASCULAR CATHETERIZATION  10/30/2015   Procedure: Peripheral Vascular Intervention;  Surgeon: Serafina Mitchell, MD;  Location: [place] CV LAB;  Service: Cardiovascular;;  . PERIPHERAL VASCULAR CATHETERIZATION N/A 04/01/2016   Procedure: Abdominal Aortogram w/Lower Extremity;  Surgeon: Serafina Mitchell, MD;  Location: [place] CV LAB;  Service: Cardiovascular;  Laterality: N/A;  . PERIPHERAL VASCULAR CATHETERIZATION Left 04/01/2016   Procedure: Peripheral Vascular Atherectomy;  Surgeon: Serafina Mitchell, MD;  Location: [place] CV LAB;  Service: Cardiovascular;  Laterality: Left;  Superficial femoral artery.  Marland Kitchen PERIPHERAL VASCULAR CATHETERIZATION Right 09/05/2016   "stent"  . PERIPHERAL VASCULAR CATHETERIZATION N/A 09/05/2016   Procedure: Abdominal Aortogram w/Lower Extremity;  Surgeon: Serafina Mitchell, MD;  Location: [place] CV LAB;  Service: Cardiovascular;  Laterality: N/A;  . PERIPHERAL VASCULAR CATHETERIZATION Right 09/05/2016   Procedure: Peripheral Vascular Intervention;  Surgeon: Serafina Mitchell, MD;  Location:  [place] CV LAB;  Service: Cardiovascular;  Laterality: Right;  Superficial Femoral  . PERIPHERAL VASCULAR CATHETERIZATION Left 09/09/2016   Procedure: Lower Extremity Angiography;  Surgeon: Serafina Mitchell, MD;  Location: [place] CV LAB;  Service: Cardiovascular;  Laterality: Left;  . PR VEIN BYPASS GRAFT,AORTO-FEM-POP  09-13-09   Left Fem-pop  . TOTAL ELBOW ARTHROPLASTY Left 09/03/2013   Procedure: LEFT TOTAL ELBOW ARTHROPLASTY;  Surgeon: Roseanne Kaufman, MD;  Location: [place];  Service: Orthopedics;  Laterality: Left;  . TOTAL KNEE ARTHROPLASTY Left 06/2006  . TUBAL LIGATION    . VAGINAL HYSTERECTOMY       Current Outpatient Prescriptions  Medication Sig Dispense Refill  . acetaminophen (TYLENOL) 500 MG tablet Take 1,000 mg by mouth 2 (two) times daily as needed for moderate pain or headache. Patient took this medication for her pain.    Marland Kitchen aspirin 81 MG tablet Take 81 mg by mouth daily.    . Cholecalciferol (VITAMIN D3) 2000 UNITS TABS Take 2,000 Units by mouth at bedtime.     Marland Kitchen  clopidogrel (PLAVIX) 75 MG tablet Take 75 mg by mouth daily.    . Cyanocobalamin (VITAMIN B-12 PO) Take 1 tablet by mouth daily.    . diazepam (VALIUM) 5 MG tablet Take 2.5 mg by mouth at bedtime as needed for anxiety.     . gabapentin (NEURONTIN) 300 MG capsule Take 300 mg by mouth 2 (two) times daily.     Marland Kitchen HYDROcodone-acetaminophen (NORCO/VICODIN) 5-325 MG tablet Take 1 tablet by mouth every 6 (six) hours as needed. 15 tablet 0  . loperamide (IMODIUM) 2 MG capsule Take 2 mg by mouth as needed for diarrhea or loose stools.    . metoprolol (LOPRESSOR) 50 MG tablet Take 25-50 mg by mouth 2 (two) times daily. Take 50mg  by mouth in the morning and take 25mg  by mouth at bedtime.    . Multiple Vitamins-Minerals (EYE VITAMINS PO) Take 1 tablet by mouth at bedtime.     . traMADol (ULTRAM) 50 MG tablet Take 50 mg by mouth at bedtime as needed for moderate pain.     . valsartan-hydrochlorothiazide (DIOVAN-HCT)  160-12.5 MG per tablet Take 0.5 tablets by mouth daily.     No current facility-administered medications for this visit.     Allergies:   Motrin [ibuprofen]; Statins; Promethazine hcl; and Sulfa antibiotics    Social History:  The patient  reports that she quit smoking about 69 years ago. Her smoking use included Cigarettes. She has never used smokeless tobacco. She reports that she does not drink alcohol or use drugs.   Family History:  The patient's family history includes Alcohol abuse in her father; Deep vein thrombosis in her brother; Diabetes in her son; Heart attack in her son; Heart disease in her brother, father, and son; Hyperlipidemia in her brother, father, sister, and son; Hypertension in her brother, father, sister, son, and son.    ROS:  General:no colds or fevers, mild weight changes Skin:no rashes or ulcers HEENT:no blurred vision, no congestion CV:see HPI PUL:see HPI GI:no diarrhea constipation or melena, no indigestion GU:no hematuria, no dysuria MS:no joint pain, + claudication cannot walk very far without pain in Lt leg Neuro:no syncope, + lightheadedness, when getting up.  Endo:no diabetes, no thyroid disease  Wt Readings from Last 3 Encounters:  09/22/16 127 lb (57.6 kg)  09/15/16 128 lb (58.1 kg)  09/09/16 130 lb (59 kg)     PHYSICAL EXAM: VS:  BP 122/62   Pulse 63   Ht 5\' 2"  (1.575 m)   Wt 127 lb (57.6 kg)   BMI 23.23 kg/m  , BMI Body mass index is 23.23 kg/m. General:Pleasant affect, NAD Skin:Warm and dry, brisk capillary refill HEENT:normocephalic, sclera clear, mucus membranes moist Neck:supple, no JVD, no bruits  Heart:S1S2 RRR without murmur, gallup, rub or click Lungs:clear without rales, rhonchi, or wheezes [id number], non tender, + BS, do not palpate liver spleen or masses Ext:no lower ext edema,+ varicosities  2+ radial pulses, knot on Lt ankle that is tender and chronic, Lt foot discolored.  Neuro:alert and oriented, MAE, follows  commands, + facial symmetry    EKG:  EKG is ordered today. The ekg ordered today demonstrates SR with sinus arrhthymias possible RVH. No acute changes from previous.    Recent Labs: 09/09/2016: BUN 25; Creatinine, Ser 1.10; Hemoglobin 11.9; Potassium 4.6; Sodium 139    Lipid Panel    Component Value Date/Time   CHOL  02/11/2009 1850    183        ATP III CLASSIFICATION:  <200  mg/dL   Desirable  200-239  mg/dL   Borderline High  >=240    mg/dL   High          TRIG 342 (H) 02/11/2009 1850   HDL 28 (L) 02/11/2009 1850   CHOLHDL 6.5 02/11/2009 1850   VLDL 68 (H) 02/11/2009 1850   LDLCALC  02/11/2009 1850    87        Total Cholesterol/HDL:CHD Risk Coronary Heart Disease Risk Table                     Men   Women  1/2 Average Risk   3.4   3.3  Average Risk       5.0   4.4  2 X Average Risk   9.6   7.1  3 X Average Risk  23.4   11.0        Use the calculated Patient Ratio above and the CHD Risk Table to determine the patient's CHD Risk.        ATP III CLASSIFICATION (LDL):  <100     mg/dL   Optimal  100-129  mg/dL   Near or Above                    Optimal  130-159  mg/dL   Borderline  160-189  mg/dL   High  >190     mg/dL   Very High       Other studies Reviewed: Additional studies/ records that were reviewed today include: . Nuc Study:  09/19/16 FINDINGS: Perfusion: No decreased activity in the left ventricle on stress imaging to suggest reversible ischemia or infarction.  Wall Motion: Normal left ventricular wall motion. No left ventricular dilation.  Left Ventricular Ejection Fraction: 73 %, previously [id number]  End diastolic volume 38 ml  End systolic volume 10 ml  IMPRESSION: 1. No reversible ischemia or infarction.  2. Normal left ventricular wall motion.  3. Left ventricular ejection fraction 73%  4. Non invasive risk stratification*: Low  ASSESSMENT AND PLAN:  1.  Pre-op exam - negative nuc study and no chest pain, Dr. Angelena Form  has seen and we agree with neg nuc and no angina she is OK for surgery, using Lee Criteria. She is moderate risk with vascular surgery, vascular disease. Pt and daughter aware.  She will follow up PRN  2.  PAD with multiple interventions and surgery.   3. HTN controlled.  She does state it is elevated at times.    Current medicines are reviewed with the patient today.  The patient Has no concerns regarding medicines.  The following changes have been made:  See above Labs/ tests ordered today include:see above  Disposition:   FU:  see above  Signed, Cecilie Kicks, NP  09/22/2016 9:40 AM    [facility] [place], [place], [place], [place] Phone: [phone]; Fax: [phone]  I have personally seen this patient with Ms. [name]. I agree with the assessment and plan as outlined above.   Lauree Chandler 09/22/2016 4:57 PM'

## 2016-09-22 ENCOUNTER — Encounter (HOSPITAL_COMMUNITY): Payer: Self-pay | Admitting: *Deleted

## 2016-09-22 ENCOUNTER — Encounter: Payer: Self-pay | Admitting: Cardiology

## 2016-09-22 ENCOUNTER — Other Ambulatory Visit: Payer: Self-pay

## 2016-09-22 ENCOUNTER — Encounter (HOSPITAL_COMMUNITY): Payer: Medicare Other

## 2016-09-22 ENCOUNTER — Ambulatory Visit (INDEPENDENT_AMBULATORY_CARE_PROVIDER_SITE_OTHER): Payer: Medicare Other | Admitting: Cardiology

## 2016-09-22 ENCOUNTER — Encounter: Payer: Medicare Other | Admitting: Surgery

## 2016-09-22 VITALS — BP 122/62 | HR 63 | Ht 62.0 in | Wt 127.0 lb

## 2016-09-22 DIAGNOSIS — Z95828 Presence of other vascular implants and grafts: Secondary | ICD-10-CM

## 2016-09-22 DIAGNOSIS — R9431 Abnormal electrocardiogram [ECG] [EKG]: Secondary | ICD-10-CM

## 2016-09-22 DIAGNOSIS — Z01818 Encounter for other preprocedural examination: Secondary | ICD-10-CM | POA: Diagnosis not present

## 2016-09-22 DIAGNOSIS — M79605 Pain in left leg: Secondary | ICD-10-CM

## 2016-09-22 DIAGNOSIS — Z01810 Encounter for preprocedural cardiovascular examination: Principal | ICD-10-CM

## 2016-09-22 DIAGNOSIS — I209 Angina pectoris, unspecified: Secondary | ICD-10-CM

## 2016-09-22 DIAGNOSIS — Z792 Long term (current) use of antibiotics: Secondary | ICD-10-CM

## 2016-09-22 DIAGNOSIS — I739 Peripheral vascular disease, unspecified: Secondary | ICD-10-CM | POA: Diagnosis not present

## 2016-09-22 DIAGNOSIS — Z9862 Peripheral vascular angioplasty status: Secondary | ICD-10-CM

## 2016-09-22 DIAGNOSIS — I10 Essential (primary) hypertension: Secondary | ICD-10-CM

## 2016-09-22 MED ORDER — CEPHALEXIN 500 MG PO CAPS: 500.0000 mg | ORAL_CAPSULE | Freq: Two times a day (BID) | ORAL | 0 refills | Status: DC

## 2016-09-22 NOTE — Patient Instructions (Addendum)
Medication Instructions:    Your physician recommends that you continue on your current medications as directed. Please refer to the Current Medication list given to you today.  --- If you need a refill on your cardiac medications before your next appointment, please call your pharmacy. ---  Labwork:  None ordered  Testing/Procedures:  None ordered  Follow-Up:  No follow up is needed at this time with [facility]  We will see you on an as needed basis.  Thank you for choosing [facility]!!

## 2016-09-23 NOTE — Anesthesia Preprocedure Evaluation (Addendum)
Anesthesia Evaluation  Patient identified by MRN, date of birth, ID band Patient awake    Reviewed: Allergy & Precautions, H&P , NPO status , Patient's Chart, lab work & pertinent test results, reviewed documented beta blocker date and time   Airway Mallampati: II  TM Distance: >3 FB Neck ROM: Full    Dental no notable dental hx. (+) Edentulous Upper, Upper Dentures, Partial Lower   Pulmonary neg pulmonary ROS, former smoker,    Pulmonary exam normal breath sounds clear to auscultation       Cardiovascular hypertension, Pt. on medications and Pt. on home beta blockers + Peripheral Vascular Disease   Rhythm:Regular Rate:Normal     Neuro/Psych Depression TIAnegative psych ROS   GI/Hepatic Neg liver ROS, GERD  Controlled,  Endo/Other  negative endocrine ROS  Renal/GU negative Renal ROS  negative genitourinary   Musculoskeletal  (+) Arthritis , Osteoarthritis,  Fibromyalgia -  Abdominal   Peds  Hematology negative hematology ROS (+) anemia ,   Anesthesia Other Findings   Reproductive/Obstetrics negative OB ROS                           Anesthesia Physical Anesthesia Plan  ASA: III  Anesthesia Plan: General   Post-op Pain Management:    Induction: Intravenous  Airway Management Planned: Oral ETT  Additional Equipment:   Intra-op Plan:   Post-operative Plan: Extubation in OR  Informed Consent: I have reviewed the patients History and Physical, chart, labs and discussed the procedure including the risks, benefits and alternatives for the proposed anesthesia with the patient or authorized representative who has indicated his/her understanding and acceptance.   Dental advisory given  Plan Discussed with: CRNA  Anesthesia Plan Comments:         Anesthesia Quick Evaluation

## 2016-09-24 ENCOUNTER — Inpatient Hospital Stay (HOSPITAL_COMMUNITY)
Admission: RE | Admit: 2016-09-24 | Discharge: 2016-10-03 | DRG: 271 | Disposition: A | Discharge status: Home Health | Payer: Medicare Other | Source: Ambulatory Visit | Attending: Surgery | Admitting: Surgery

## 2016-09-24 ENCOUNTER — Inpatient Hospital Stay (HOSPITAL_COMMUNITY): Payer: Medicare Other | Admitting: Anesthesiology

## 2016-09-24 ENCOUNTER — Encounter (HOSPITAL_COMMUNITY): Payer: Self-pay | Admitting: Certified Registered Nurse Anesthetist

## 2016-09-24 ENCOUNTER — Encounter (HOSPITAL_COMMUNITY)
Admission: RE | Disposition: A | Discharge status: Home Health | Payer: Self-pay | Source: Ambulatory Visit | Attending: Surgery

## 2016-09-24 DIAGNOSIS — M810 Age-related osteoporosis without current pathological fracture: Secondary | ICD-10-CM | POA: Diagnosis present

## 2016-09-24 DIAGNOSIS — D62 Acute posthemorrhagic anemia: Secondary | ICD-10-CM | POA: Diagnosis not present

## 2016-09-24 DIAGNOSIS — I998 Other disorder of circulatory system: Secondary | ICD-10-CM | POA: Diagnosis not present

## 2016-09-24 DIAGNOSIS — I739 Peripheral vascular disease, unspecified: Secondary | ICD-10-CM | POA: Diagnosis not present

## 2016-09-24 DIAGNOSIS — F329 Major depressive disorder, single episode, unspecified: Secondary | ICD-10-CM | POA: Diagnosis present

## 2016-09-24 DIAGNOSIS — K5900 Constipation, unspecified: Secondary | ICD-10-CM | POA: Diagnosis not present

## 2016-09-24 DIAGNOSIS — I10 Essential (primary) hypertension: Secondary | ICD-10-CM | POA: Diagnosis not present

## 2016-09-24 DIAGNOSIS — Y92239 Unspecified place in hospital as the place of occurrence of the external cause: Secondary | ICD-10-CM | POA: Diagnosis not present

## 2016-09-24 DIAGNOSIS — I70223 Atherosclerosis of native arteries of extremities with rest pain, bilateral legs: Secondary | ICD-10-CM | POA: Diagnosis not present

## 2016-09-24 DIAGNOSIS — I4891 Unspecified atrial fibrillation: Secondary | ICD-10-CM | POA: Diagnosis not present

## 2016-09-24 DIAGNOSIS — G8929 Other chronic pain: Secondary | ICD-10-CM | POA: Diagnosis present

## 2016-09-24 DIAGNOSIS — Z8673 Personal history of transient ischemic attack (TIA), and cerebral infarction without residual deficits: Secondary | ICD-10-CM

## 2016-09-24 DIAGNOSIS — M1990 Unspecified osteoarthritis, unspecified site: Secondary | ICD-10-CM | POA: Diagnosis not present

## 2016-09-24 DIAGNOSIS — Z96622 Presence of left artificial elbow joint: Secondary | ICD-10-CM | POA: Diagnosis present

## 2016-09-24 DIAGNOSIS — Z9842 Cataract extraction status, left eye: Secondary | ICD-10-CM

## 2016-09-24 DIAGNOSIS — Z7902 Long term (current) use of antithrombotics/antiplatelets: Secondary | ICD-10-CM | POA: Diagnosis not present

## 2016-09-24 DIAGNOSIS — K219 Gastro-esophageal reflux disease without esophagitis: Secondary | ICD-10-CM | POA: Diagnosis present

## 2016-09-24 DIAGNOSIS — Z882 Allergy status to sulfonamides status: Secondary | ICD-10-CM

## 2016-09-24 DIAGNOSIS — Z87891 Personal history of nicotine dependence: Secondary | ICD-10-CM | POA: Diagnosis not present

## 2016-09-24 DIAGNOSIS — D649 Anemia, unspecified: Secondary | ICD-10-CM | POA: Diagnosis present

## 2016-09-24 DIAGNOSIS — Z8249 Family history of ischemic heart disease and other diseases of the circulatory system: Secondary | ICD-10-CM | POA: Diagnosis not present

## 2016-09-24 DIAGNOSIS — T82868A Thrombosis of vascular prosthetic devices, implants and grafts, initial encounter: Secondary | ICD-10-CM | POA: Diagnosis not present

## 2016-09-24 DIAGNOSIS — M25551 Pain in right hip: Secondary | ICD-10-CM | POA: Diagnosis not present

## 2016-09-24 DIAGNOSIS — Y848 Other medical procedures as the cause of abnormal reaction of the patient, or of later complication, without mention of misadventure at the time of the procedure: Secondary | ICD-10-CM | POA: Diagnosis not present

## 2016-09-24 DIAGNOSIS — I70229 Atherosclerosis of native arteries of extremities with rest pain, unspecified extremity: Secondary | ICD-10-CM

## 2016-09-24 DIAGNOSIS — Z961 Presence of intraocular lens: Secondary | ICD-10-CM | POA: Diagnosis present

## 2016-09-24 DIAGNOSIS — M545 Low back pain: Secondary | ICD-10-CM | POA: Diagnosis present

## 2016-09-24 DIAGNOSIS — I743 Embolism and thrombosis of arteries of the lower extremities: Secondary | ICD-10-CM | POA: Diagnosis not present

## 2016-09-24 DIAGNOSIS — Z955 Presence of coronary angioplasty implant and graft: Secondary | ICD-10-CM

## 2016-09-24 DIAGNOSIS — Z79899 Other long term (current) drug therapy: Secondary | ICD-10-CM

## 2016-09-24 DIAGNOSIS — I480 Paroxysmal atrial fibrillation: Secondary | ICD-10-CM

## 2016-09-24 DIAGNOSIS — M797 Fibromyalgia: Secondary | ICD-10-CM | POA: Diagnosis present

## 2016-09-24 DIAGNOSIS — Z452 Encounter for adjustment and management of vascular access device: Secondary | ICD-10-CM

## 2016-09-24 DIAGNOSIS — E785 Hyperlipidemia, unspecified: Secondary | ICD-10-CM | POA: Diagnosis not present

## 2016-09-24 DIAGNOSIS — Z85828 Personal history of other malignant neoplasm of skin: Secondary | ICD-10-CM | POA: Diagnosis not present

## 2016-09-24 DIAGNOSIS — Z9841 Cataract extraction status, right eye: Secondary | ICD-10-CM

## 2016-09-24 DIAGNOSIS — Z885 Allergy status to narcotic agent status: Secondary | ICD-10-CM

## 2016-09-24 DIAGNOSIS — I248 Other forms of acute ischemic heart disease: Secondary | ICD-10-CM | POA: Diagnosis not present

## 2016-09-24 DIAGNOSIS — R200 Anesthesia of skin: Secondary | ICD-10-CM | POA: Diagnosis not present

## 2016-09-24 DIAGNOSIS — Z888 Allergy status to other drugs, medicaments and biological substances status: Secondary | ICD-10-CM

## 2016-09-24 DIAGNOSIS — I70422 Atherosclerosis of autologous vein bypass graft(s) of the extremities with rest pain, left leg: Secondary | ICD-10-CM | POA: Diagnosis present

## 2016-09-24 DIAGNOSIS — R748 Abnormal levels of other serum enzymes: Secondary | ICD-10-CM | POA: Diagnosis not present

## 2016-09-24 DIAGNOSIS — I70222 Atherosclerosis of native arteries of extremities with rest pain, left leg: Secondary | ICD-10-CM | POA: Diagnosis not present

## 2016-09-24 DIAGNOSIS — Z96652 Presence of left artificial knee joint: Secondary | ICD-10-CM | POA: Diagnosis present

## 2016-09-24 HISTORY — PX: FEMORAL-POPLITEAL BYPASS GRAFT: SHX937

## 2016-09-24 LAB — SURGICAL PCR SCREEN
MRSA, PCR: NEGATIVE
Staphylococcus aureus: NEGATIVE

## 2016-09-24 LAB — COMPREHENSIVE METABOLIC PANEL
ALK PHOS: 64 U/L (ref 38–126)
ALT: 14 U/L (ref 14–54)
ANION GAP: 9 (ref 5–15)
AST: 16 U/L (ref 15–41)
Albumin: 4 g/dL (ref 3.5–5.0)
BILIRUBIN TOTAL: 0.7 mg/dL (ref 0.3–1.2)
BUN: 24 mg/dL — AB (ref 6–20)
CALCIUM: 9.7 mg/dL (ref 8.9–10.3)
CO2: 23 mmol/L (ref 22–32)
Chloride: 105 mmol/L (ref 101–111)
Creatinine, Ser: 1.18 mg/dL — ABNORMAL HIGH (ref 0.44–1.00)
GFR calc Af Amer: 47 mL/min — ABNORMAL LOW (ref >60)
GFR, EST NON AFRICAN AMERICAN: 41 mL/min — AB (ref >60)
Glucose, Bld: 97 mg/dL (ref 65–99)
POTASSIUM: 4.1 mmol/L (ref 3.5–5.1)
Sodium: 137 mmol/L (ref 135–145)
TOTAL PROTEIN: 7 g/dL (ref 6.5–8.1)

## 2016-09-24 LAB — PROTIME-INR
INR: 1.01
PROTHROMBIN TIME: 13.3 s (ref 11.4–15.2)

## 2016-09-24 LAB — CBC
HEMATOCRIT: 36.9 % (ref 36.0–46.0)
Hemoglobin: 11.9 g/dL — ABNORMAL LOW (ref 12.0–15.0)
MCH: 29.7 pg (ref 26.0–34.0)
MCHC: 32.2 g/dL (ref 30.0–36.0)
MCV: 92 fL (ref 78.0–100.0)
Platelets: 264 10*3/uL (ref 150–400)
RBC: 4.01 MIL/uL (ref 3.87–5.11)
RDW: 14.2 % (ref 11.5–15.5)
WBC: 5.5 10*3/uL (ref 4.0–10.5)

## 2016-09-24 LAB — APTT: aPTT: 29 seconds (ref 24–36)

## 2016-09-24 SURGERY — BYPASS GRAFT FEMORAL-POPLITEAL ARTERY
Anesthesia: General | Site: Leg Upper | Laterality: Left

## 2016-09-24 MED ORDER — HEPARIN SODIUM (PORCINE) 1000 UNIT/ML IJ SOLN
INTRAMUSCULAR | Status: AC
  Filled 2016-09-24: qty 1

## 2016-09-24 MED ORDER — SUGAMMADEX SODIUM 200 MG/2ML IV SOLN
INTRAVENOUS | Status: DC | PRN
  Administered 2016-09-24: 120 mg via INTRAVENOUS

## 2016-09-24 MED ORDER — PANTOPRAZOLE SODIUM 40 MG PO TBEC
40.0000 mg | DELAYED_RELEASE_TABLET | Freq: Every day | ORAL | Status: DC
  Administered 2016-09-24 – 2016-10-02 (×8): 40 mg via ORAL
  Filled 2016-09-24 (×8): qty 1

## 2016-09-24 MED ORDER — VALSARTAN-HYDROCHLOROTHIAZIDE 160-12.5 MG PO TABS: 0.5000 | ORAL_TABLET | Freq: Every day | ORAL | Status: DC

## 2016-09-24 MED ORDER — PHENYLEPHRINE HCL 10 MG/ML IJ SOLN
INTRAMUSCULAR | Status: DC | PRN
  Administered 2016-09-24 (×2): 80 ug via INTRAVENOUS

## 2016-09-24 MED ORDER — HEPARIN SODIUM (PORCINE) 1000 UNIT/ML IJ SOLN
INTRAMUSCULAR | Status: DC | PRN
  Administered 2016-09-24: 5000 [IU] via INTRAVENOUS
  Administered 2016-09-24: 1000 [IU] via INTRAVENOUS

## 2016-09-24 MED ORDER — ONDANSETRON HCL 4 MG/2ML IJ SOLN
INTRAMUSCULAR | Status: AC
  Filled 2016-09-24: qty 2

## 2016-09-24 MED ORDER — PROTAMINE SULFATE 10 MG/ML IV SOLN
INTRAVENOUS | Status: AC
  Filled 2016-09-24: qty 5

## 2016-09-24 MED ORDER — ROCURONIUM BROMIDE 10 MG/ML (PF) SYRINGE
PREFILLED_SYRINGE | INTRAVENOUS | Status: AC
  Filled 2016-09-24: qty 20

## 2016-09-24 MED ORDER — LOPERAMIDE HCL 2 MG PO CAPS
2.0000 mg | ORAL_CAPSULE | ORAL | Status: DC | PRN
  Administered 2016-10-01: 2 mg via ORAL
  Filled 2016-09-24: qty 1

## 2016-09-24 MED ORDER — SUGAMMADEX SODIUM 200 MG/2ML IV SOLN
INTRAVENOUS | Status: AC
  Filled 2016-09-24: qty 2

## 2016-09-24 MED ORDER — SODIUM CHLORIDE 0.9 % IV SOLN
INTRAVENOUS | Status: DC | PRN
  Administered 2016-09-24: 500 mL

## 2016-09-24 MED ORDER — HYDROMORPHONE HCL 2 MG/ML IJ SOLN
INTRAMUSCULAR | Status: AC
  Administered 2016-09-24: 0.5 mg
  Filled 2016-09-24: qty 1

## 2016-09-24 MED ORDER — HYDRALAZINE HCL 20 MG/ML IJ SOLN: 5.0000 mg | INTRAMUSCULAR | Status: DC | PRN

## 2016-09-24 MED ORDER — PROPOFOL 10 MG/ML IV BOLUS
INTRAVENOUS | Status: AC
  Filled 2016-09-24: qty 20

## 2016-09-24 MED ORDER — MAGNESIUM SULFATE 2 GM/50ML IV SOLN
2.0000 g | Freq: Every day | INTRAVENOUS | Status: DC | PRN
  Filled 2016-09-24: qty 50

## 2016-09-24 MED ORDER — DEXTROSE 5 % IV SOLN
1.5000 g | Freq: Two times a day (BID) | INTRAVENOUS | Status: AC
  Administered 2016-09-24 – 2016-09-25 (×2): 1.5 g via INTRAVENOUS
  Filled 2016-09-24 (×2): qty 1.5

## 2016-09-24 MED ORDER — LABETALOL HCL 5 MG/ML IV SOLN
10.0000 mg | INTRAVENOUS | Status: DC | PRN
  Administered 2016-09-26: 10 mg via INTRAVENOUS
  Filled 2016-09-24: qty 4

## 2016-09-24 MED ORDER — HYDROMORPHONE HCL 1 MG/ML IJ SOLN
INTRAMUSCULAR | Status: AC
  Filled 2016-09-24: qty 0.5

## 2016-09-24 MED ORDER — HEMOSTATIC AGENTS (NO CHARGE) OPTIME
TOPICAL | Status: DC | PRN
  Administered 2016-09-24 (×2): 1 via TOPICAL

## 2016-09-24 MED ORDER — GUAIFENESIN-DM 100-10 MG/5ML PO SYRP: 15.0000 mL | ORAL_SOLUTION | ORAL | Status: DC | PRN

## 2016-09-24 MED ORDER — METOPROLOL TARTRATE 25 MG PO TABS
25.0000 mg | ORAL_TABLET | Freq: Every day | ORAL | Status: DC
  Administered 2016-09-24 – 2016-09-28 (×5): 25 mg via ORAL
  Filled 2016-09-24 (×5): qty 1

## 2016-09-24 MED ORDER — SUCCINYLCHOLINE CHLORIDE 200 MG/10ML IV SOSY
PREFILLED_SYRINGE | INTRAVENOUS | Status: AC
  Filled 2016-09-24: qty 20

## 2016-09-24 MED ORDER — HYDROMORPHONE HCL 1 MG/ML IJ SOLN
0.2500 mg | INTRAMUSCULAR | Status: DC | PRN
  Administered 2016-09-24 (×2): 0.5 mg via INTRAVENOUS

## 2016-09-24 MED ORDER — IRBESARTAN 300 MG PO TABS: 150.0000 mg | ORAL_TABLET | Freq: Every day | ORAL | Status: DC

## 2016-09-24 MED ORDER — HYDROCODONE-ACETAMINOPHEN 5-325 MG PO TABS
1.0000 | ORAL_TABLET | ORAL | Status: DC | PRN
  Administered 2016-09-24: 1 via ORAL
  Administered 2016-09-25: 2 via ORAL
  Administered 2016-09-25 (×2): 1 via ORAL
  Administered 2016-09-25 – 2016-09-26 (×2): 2 via ORAL
  Administered 2016-09-26: 1 via ORAL
  Administered 2016-09-27 – 2016-09-28 (×6): 2 via ORAL
  Administered 2016-09-29 (×2): 1 via ORAL
  Administered 2016-09-29 – 2016-09-30 (×6): 2 via ORAL
  Administered 2016-10-01 (×3): 1 via ORAL
  Administered 2016-10-01: 2 via ORAL
  Administered 2016-10-02 (×3): 1 via ORAL
  Filled 2016-09-24 (×2): qty 1
  Filled 2016-09-24 (×2): qty 2
  Filled 2016-09-24: qty 1
  Filled 2016-09-24 (×2): qty 2
  Filled 2016-09-24 (×2): qty 1
  Filled 2016-09-24 (×5): qty 2
  Filled 2016-09-24: qty 1
  Filled 2016-09-24: qty 2
  Filled 2016-09-24: qty 1
  Filled 2016-09-24 (×6): qty 2
  Filled 2016-09-24 (×3): qty 1
  Filled 2016-09-24 (×3): qty 2

## 2016-09-24 MED ORDER — SODIUM CHLORIDE 0.9 % IV SOLN: INTRAVENOUS | Status: DC

## 2016-09-24 MED ORDER — LACTATED RINGERS IV SOLN
INTRAVENOUS | Status: DC | PRN
  Administered 2016-09-24 (×2): via INTRAVENOUS

## 2016-09-24 MED ORDER — CHLORHEXIDINE GLUCONATE CLOTH 2 % EX PADS: 6.0000 | MEDICATED_PAD | Freq: Once | CUTANEOUS | Status: DC

## 2016-09-24 MED ORDER — LIDOCAINE HCL (CARDIAC) 20 MG/ML IV SOLN
INTRAVENOUS | Status: DC | PRN
  Administered 2016-09-24: 40 mg via INTRAVENOUS

## 2016-09-24 MED ORDER — EPHEDRINE SULFATE 50 MG/ML IJ SOLN
INTRAMUSCULAR | Status: DC | PRN
  Administered 2016-09-24 (×3): 10 mg via INTRAVENOUS
  Administered 2016-09-24: 5 mg via INTRAVENOUS
  Administered 2016-09-24: 10 mg via INTRAVENOUS

## 2016-09-24 MED ORDER — ASPIRIN 81 MG PO CHEW
81.0000 mg | CHEWABLE_TABLET | Freq: Every day | ORAL | Status: DC
  Administered 2016-09-25 – 2016-10-02 (×8): 81 mg via ORAL
  Filled 2016-09-24 (×8): qty 1

## 2016-09-24 MED ORDER — EPHEDRINE 5 MG/ML INJ
INTRAVENOUS | Status: AC
  Filled 2016-09-24: qty 20

## 2016-09-24 MED ORDER — HYDRALAZINE HCL 20 MG/ML IJ SOLN
INTRAMUSCULAR | Status: AC
  Filled 2016-09-24: qty 1

## 2016-09-24 MED ORDER — MORPHINE SULFATE (PF) 2 MG/ML IV SOLN
1.0000 mg | INTRAVENOUS | Status: DC | PRN
  Filled 2016-09-24: qty 1

## 2016-09-24 MED ORDER — SODIUM CHLORIDE 0.9 % IV SOLN
500.0000 mL | Freq: Once | INTRAVENOUS | Status: AC | PRN
  Administered 2016-09-24: 500 mL via INTRAVENOUS

## 2016-09-24 MED ORDER — ENOXAPARIN SODIUM 40 MG/0.4ML ~~LOC~~ SOLN
40.0000 mg | SUBCUTANEOUS | Status: DC
  Administered 2016-09-25 – 2016-09-28 (×3): 40 mg via SUBCUTANEOUS
  Filled 2016-09-24 (×3): qty 0.4

## 2016-09-24 MED ORDER — HYDRALAZINE HCL 20 MG/ML IJ SOLN
INTRAMUSCULAR | Status: DC | PRN
  Administered 2016-09-24 (×2): 2.5 mg via INTRAVENOUS

## 2016-09-24 MED ORDER — ONDANSETRON HCL 4 MG/2ML IJ SOLN
INTRAMUSCULAR | Status: DC | PRN
  Administered 2016-09-24: 4 mg via INTRAVENOUS

## 2016-09-24 MED ORDER — LIDOCAINE 2% (20 MG/ML) 5 ML SYRINGE
INTRAMUSCULAR | Status: AC
  Filled 2016-09-24: qty 5

## 2016-09-24 MED ORDER — GABAPENTIN 300 MG PO CAPS
300.0000 mg | ORAL_CAPSULE | Freq: Two times a day (BID) | ORAL | Status: DC
  Administered 2016-09-24 – 2016-09-30 (×11): 300 mg via ORAL
  Filled 2016-09-24 (×12): qty 1

## 2016-09-24 MED ORDER — DOCUSATE SODIUM 100 MG PO CAPS
100.0000 mg | ORAL_CAPSULE | Freq: Every day | ORAL | Status: DC
  Administered 2016-09-25 – 2016-09-30 (×4): 100 mg via ORAL
  Filled 2016-09-24 (×6): qty 1

## 2016-09-24 MED ORDER — 0.9 % SODIUM CHLORIDE (POUR BTL) OPTIME
TOPICAL | Status: DC | PRN
  Administered 2016-09-24: 2000 mL

## 2016-09-24 MED ORDER — POTASSIUM CHLORIDE CRYS ER 20 MEQ PO TBCR: 20.0000 meq | EXTENDED_RELEASE_TABLET | Freq: Every day | ORAL | Status: DC | PRN

## 2016-09-24 MED ORDER — PHENOL 1.4 % MT LIQD
1.0000 | OROMUCOSAL | Status: DC | PRN
  Filled 2016-09-24: qty 177

## 2016-09-24 MED ORDER — ONDANSETRON HCL 4 MG/2ML IJ SOLN
4.0000 mg | Freq: Four times a day (QID) | INTRAMUSCULAR | Status: DC | PRN
  Administered 2016-09-26: 4 mg via INTRAVENOUS
  Filled 2016-09-24: qty 2

## 2016-09-24 MED ORDER — PHENYLEPHRINE 40 MCG/ML (10ML) SYRINGE FOR IV PUSH (FOR BLOOD PRESSURE SUPPORT)
PREFILLED_SYRINGE | INTRAVENOUS | Status: AC
  Filled 2016-09-24: qty 10

## 2016-09-24 MED ORDER — CLOPIDOGREL BISULFATE 75 MG PO TABS
75.0000 mg | ORAL_TABLET | Freq: Every day | ORAL | Status: DC
  Administered 2016-09-25: 75 mg via ORAL
  Filled 2016-09-24 (×2): qty 1

## 2016-09-24 MED ORDER — PROTAMINE SULFATE 10 MG/ML IV SOLN
INTRAVENOUS | Status: DC | PRN
  Administered 2016-09-24: 50 mg via INTRAVENOUS

## 2016-09-24 MED ORDER — FENTANYL CITRATE (PF) 250 MCG/5ML IJ SOLN
INTRAMUSCULAR | Status: AC
  Filled 2016-09-24: qty 10

## 2016-09-24 MED ORDER — METOPROLOL TARTRATE 50 MG PO TABS
50.0000 mg | ORAL_TABLET | Freq: Every day | ORAL | Status: DC
  Administered 2016-09-25 – 2016-09-29 (×5): 50 mg via ORAL
  Filled 2016-09-24 (×5): qty 1

## 2016-09-24 MED ORDER — ALUM & MAG HYDROXIDE-SIMETH 200-200-20 MG/5ML PO SUSP: 15.0000 mL | ORAL | Status: DC | PRN

## 2016-09-24 MED ORDER — SODIUM CHLORIDE 0.9 % IV SOLN
INTRAVENOUS | Status: DC
  Administered 2016-09-24: 22:00:00 via INTRAVENOUS

## 2016-09-24 MED ORDER — CEPHALEXIN 500 MG PO CAPS
500.0000 mg | ORAL_CAPSULE | Freq: Two times a day (BID) | ORAL | Status: DC
  Administered 2016-09-25 – 2016-10-03 (×14): 500 mg via ORAL
  Filled 2016-09-24 (×16): qty 1

## 2016-09-24 MED ORDER — MUPIROCIN 2 % EX OINT
1.0000 "application " | TOPICAL_OINTMENT | Freq: Once | CUTANEOUS | Status: DC
  Filled 2016-09-24: qty 22

## 2016-09-24 MED ORDER — DIAZEPAM 5 MG PO TABS
2.5000 mg | ORAL_TABLET | Freq: Every evening | ORAL | Status: DC | PRN
  Filled 2016-09-24: qty 1

## 2016-09-24 MED ORDER — ACETAMINOPHEN 500 MG PO TABS
1000.0000 mg | ORAL_TABLET | Freq: Two times a day (BID) | ORAL | Status: DC | PRN
  Administered 2016-09-25 – 2016-10-02 (×4): 1000 mg via ORAL
  Filled 2016-09-24 (×5): qty 2

## 2016-09-24 MED ORDER — HYDROCHLOROTHIAZIDE 12.5 MG PO CAPS: 12.5000 mg | ORAL_CAPSULE | Freq: Every day | ORAL | Status: DC

## 2016-09-24 MED ORDER — PROPOFOL 10 MG/ML IV BOLUS
INTRAVENOUS | Status: DC | PRN
  Administered 2016-09-24: 50 mg via INTRAVENOUS

## 2016-09-24 MED ORDER — ROCURONIUM BROMIDE 100 MG/10ML IV SOLN
INTRAVENOUS | Status: DC | PRN
  Administered 2016-09-24: 10 mg via INTRAVENOUS
  Administered 2016-09-24: 50 mg via INTRAVENOUS
  Administered 2016-09-24: 10 mg via INTRAVENOUS
  Administered 2016-09-24: 20 mg via INTRAVENOUS

## 2016-09-24 MED ORDER — LABETALOL HCL 5 MG/ML IV SOLN
INTRAVENOUS | Status: DC | PRN
  Administered 2016-09-24 (×3): 5 mg via INTRAVENOUS

## 2016-09-24 MED ORDER — DEXTROSE 5 % IV SOLN
1.5000 g | INTRAVENOUS | Status: AC
  Administered 2016-09-24: 1.5 g via INTRAVENOUS
  Filled 2016-09-24: qty 1.5

## 2016-09-24 MED ORDER — FENTANYL CITRATE (PF) 100 MCG/2ML IJ SOLN
INTRAMUSCULAR | Status: DC | PRN
  Administered 2016-09-24 (×8): 50 ug via INTRAVENOUS

## 2016-09-24 MED ORDER — METOPROLOL TARTRATE 5 MG/5ML IV SOLN
2.0000 mg | INTRAVENOUS | Status: DC | PRN
Start: 2016-09-24 — End: 2016-10-03
  Filled 2016-09-24: qty 5

## 2016-09-24 MED ORDER — METOPROLOL TARTRATE 25 MG PO TABS: 25.0000 mg | ORAL_TABLET | Freq: Two times a day (BID) | ORAL | Status: DC

## 2016-09-24 SURGICAL SUPPLY — 76 items
ADH SKN CLS APL DERMABOND .7 (GAUZE/BANDAGES/DRESSINGS) ×1
ADH SKN CLS LQ APL DERMABOND (GAUZE/BANDAGES/DRESSINGS) ×2
BANDAGE ACE 4X5 VEL STRL LF (GAUZE/BANDAGES/DRESSINGS) IMPLANT
BANDAGE ESMARK 6X9 LF (GAUZE/BANDAGES/DRESSINGS) IMPLANT
BNDG CMPR 9X6 STRL LF SNTH (GAUZE/BANDAGES/DRESSINGS)
BNDG ESMARK 6X9 LF (GAUZE/BANDAGES/DRESSINGS)
CANISTER SUCTION 2500CC (MISCELLANEOUS) ×2 IMPLANT
CANNULA VESSEL 3MM 2 BLNT TIP (CANNULA) ×1 IMPLANT
CATH EMB 3FR 80CM (CATHETERS) ×1 IMPLANT
CATH EMB 5FR 80CM (CATHETERS) ×1 IMPLANT
CLIP TI MEDIUM 24 (CLIP) ×2 IMPLANT
CLIP TI WIDE RED SMALL 24 (CLIP) ×2 IMPLANT
COVER PROBE W GEL 5X96 (DRAPES) ×1 IMPLANT
CUFF TOURNIQUET SINGLE 24IN (TOURNIQUET CUFF) IMPLANT
CUFF TOURNIQUET SINGLE 34IN LL (TOURNIQUET CUFF) IMPLANT
CUFF TOURNIQUET SINGLE 44IN (TOURNIQUET CUFF) IMPLANT
DERMABOND ADHESIVE PROPEN (GAUZE/BANDAGES/DRESSINGS) ×2
DERMABOND ADVANCED (GAUZE/BANDAGES/DRESSINGS) ×1
DERMABOND ADVANCED .7 DNX12 (GAUZE/BANDAGES/DRESSINGS) ×1 IMPLANT
DERMABOND ADVANCED .7 DNX6 (GAUZE/BANDAGES/DRESSINGS) IMPLANT
DRAIN CHANNEL 15F RND FF W/TCR (WOUND CARE) IMPLANT
DRAPE PROXIMA HALF (DRAPES) IMPLANT
DRAPE X-RAY CASS 24X20 (DRAPES) IMPLANT
DRSG COVADERM 4X10 (GAUZE/BANDAGES/DRESSINGS) IMPLANT
DRSG COVADERM 4X8 (GAUZE/BANDAGES/DRESSINGS) IMPLANT
ELECT CAUTERY BLADE 6.4 (BLADE) ×1 IMPLANT
ELECT REM PT RETURN 9FT ADLT (ELECTROSURGICAL) ×2
ELECTRODE REM PT RTRN 9FT ADLT (ELECTROSURGICAL) ×1 IMPLANT
EVACUATOR SILICONE 100CC (DRAIN) IMPLANT
GLOVE BIO SURGEON STRL SZ 6.5 (GLOVE) ×1 IMPLANT
GLOVE BIOGEL PI IND STRL 6.5 (GLOVE) IMPLANT
GLOVE BIOGEL PI IND STRL 7.5 (GLOVE) ×1 IMPLANT
GLOVE BIOGEL PI INDICATOR 6.5 (GLOVE) ×2
GLOVE BIOGEL PI INDICATOR 7.5 (GLOVE) ×1
GLOVE SURG SS PI 7.5 STRL IVOR (GLOVE) ×2 IMPLANT
GOWN STRL REUS W/ TWL LRG LVL3 (GOWN DISPOSABLE) ×2 IMPLANT
GOWN STRL REUS W/ TWL XL LVL3 (GOWN DISPOSABLE) ×1 IMPLANT
GOWN STRL REUS W/TWL LRG LVL3 (GOWN DISPOSABLE) ×4
GOWN STRL REUS W/TWL XL LVL3 (GOWN DISPOSABLE) ×2
GRAFT PROPATEN W/RING 6X80X60 (Vascular Products) ×1 IMPLANT
HEMOSTAT SNOW SURGICEL 2X4 (HEMOSTASIS) ×2 IMPLANT
INSERT FOGARTY SM (MISCELLANEOUS) IMPLANT
KIT BASIN OR (CUSTOM PROCEDURE TRAY) ×2 IMPLANT
KIT ROOM TURNOVER OR (KITS) ×2 IMPLANT
MARKER GRAFT CORONARY BYPASS (MISCELLANEOUS) IMPLANT
NS IRRIG 1000ML POUR BTL (IV SOLUTION) ×4 IMPLANT
PACK PERIPHERAL VASCULAR (CUSTOM PROCEDURE TRAY) ×2 IMPLANT
PAD ARMBOARD 7.5X6 YLW CONV (MISCELLANEOUS) ×4 IMPLANT
PADDING CAST COTTON 6X4 STRL (CAST SUPPLIES) IMPLANT
PENCIL BUTTON HOLSTER BLD 10FT (ELECTRODE) ×1 IMPLANT
SEALANT SURG COSEAL 4ML (VASCULAR PRODUCTS) ×1 IMPLANT
SET COLLECT BLD 21X3/4 12 (NEEDLE) IMPLANT
SPONGE LAP 18X18 X RAY DECT (DISPOSABLE) ×2 IMPLANT
STOPCOCK 4 WAY LG BORE MALE ST (IV SETS) ×1 IMPLANT
SUT ETHILON 3 0 PS 1 (SUTURE) IMPLANT
SUT GORETEX 6.0 TT13 (SUTURE) ×1 IMPLANT
SUT GORETEX 6.0 TT9 (SUTURE) IMPLANT
SUT PROLENE 5 0 C 1 24 (SUTURE) ×3 IMPLANT
SUT PROLENE 6 0 BV (SUTURE) ×6 IMPLANT
SUT PROLENE 6 0 C 1 24 (SUTURE) ×2 IMPLANT
SUT PROLENE 7 0 BV 1 (SUTURE) IMPLANT
SUT SILK 2 0 FS (SUTURE) ×1 IMPLANT
SUT SILK 2 0 SH (SUTURE) ×2 IMPLANT
SUT SILK 3 0 (SUTURE)
SUT SILK 3-0 18XBRD TIE 12 (SUTURE) IMPLANT
SUT VIC AB 2-0 CT1 27 (SUTURE) ×6
SUT VIC AB 2-0 CT1 TAPERPNT 27 (SUTURE) ×2 IMPLANT
SUT VIC AB 3-0 SH 27 (SUTURE) ×4
SUT VIC AB 3-0 SH 27X BRD (SUTURE) ×2 IMPLANT
SUT VICRYL 4-0 PS2 18IN ABS (SUTURE) ×4 IMPLANT
SYR 3ML LL SCALE MARK (SYRINGE) ×1 IMPLANT
TAPE UMBILICAL COTTON 1/8X30 (MISCELLANEOUS) IMPLANT
TRAY FOLEY W/METER SILVER 16FR (SET/KITS/TRAYS/PACK) ×2 IMPLANT
TUBING EXTENTION W/L.L. (IV SETS) IMPLANT
UNDERPAD 30X30 (UNDERPADS AND DIAPERS) ×2 IMPLANT
WATER STERILE IRR 1000ML POUR (IV SOLUTION) ×2 IMPLANT

## 2016-09-24 NOTE — Transfer of Care (Signed)
Immediate Anesthesia Transfer of Care Note  Patient: [name]  Procedure(s) Performed: Procedure(s): REDO FEMORAL TO POPLITEAL ARTERY BYPASS GRAFT USING 6MM PROPATEN RINGED GORTEX GRAFT (Left)  Patient Location: PACU  Anesthesia Type:General  Level of Consciousness: awake, alert , oriented and patient cooperative  Airway & Oxygen Therapy: Patient Spontanous Breathing and Patient connected to nasal cannula oxygen  Post-op Assessment: Report given to RN and Post -op Vital signs reviewed and stable  Post vital signs: Reviewed and stable  Last Vitals:  Vitals:   09/24/16 0639  BP: (!) 170/52  Pulse: 65  Resp: 16  Temp: 36.6 C    Last Pain:  Vitals:   09/24/16 0748  TempSrc:   PainSc: 4       Patients Stated Pain Goal: 5 ([id number])  Complications: No apparent anesthesia complications

## 2016-09-24 NOTE — H&P (View-Only) (Signed)
[facility] [place]  Patient name: [name] MRN: [id number] DOB: [birth year] Sex: female  REASON FOR VISIT: follow up  HPI: The patient is back today for followup. She initially presented in October 2010 with ischemic changes to her left foot. She underwent distal left superficial femoral to below knee popliteal artery bypass graft with reversed ipsilateral greater saphenous vein she did have issues with wound healing which ultimately resolved. She developed a high-grade stenosis within the popliteal artery above the proximal anastomosis which was stented in 2011. She was also found to have an elevated velocity and on 12/11/2010 and in-stent stenosis was re\re stented using a 6 x 30 EV3 stents.   She recently had an ultrasound that showed native superficial femoral artery stenosis proximal to her stents. Therefore on 01/05/2015 she underwent angiography. At that time, she had stenting of her left external iliac stenosis with a 7 x 30 self-expanding stent as well as stenting of her left superficial femoral artery using a 6 x 30 self-expanding stent. The lesion in the superficial femoral artery was initially treated with a drug coated balloon angioplasty which was complicated by non-flow limiting dissection requiring stenting the bypass graft was found to be widely patent with two-vessel runoff via the peroneal and anterior tibial artery.  She developed velocity elevations again and in December 2016 underwent angiography and stenting as well as in-stent stenosis angioplasty of her left superficial femoral-popliteal artery.  She again had velocity elevations recently and on 04/01/2016 I performed atherectomy with a jetstream device and drug coated balloon angioplasty.   She recently presented with bilateral rest pain.  She went back to the cath lab for angiogram on 09/05/2016 and had drug coated balloon angioplasty and stenting of her  right superficial femoral artery.  This was done for occlusion.  She was then brought back for evaluation of her left leg.  She was found to have occlusion of her native superficial femoral artery above her femoral-popliteal bypass graft which remained patent.    Past Medical History:  Diagnosis Date  . Anemia   . Anginal pain ([place])   . Arthritis    "qwhere" (09/05/2016)  . Chronic lower back pain   . Complication of anesthesia    "takes a long time for it to wear off; I can hallucinate if I take too much" (09/05/2016)  . DVT (deep venous thrombosis) ([place]) 10/2009  . Fall from steps 08/31/2013   Fx. pelvis, Left Hip, Left Elbow  . Fibromyalgia   . GERD (gastroesophageal reflux disease)   . High cholesterol   . History of hiatal hernia   . Hypertension   . Macular degeneration, wet ([place])    "started in right eye; now legally blind in that eye; now started in left eye but pretty much in control" (09/05/2016)  . Osteoporosis   . Peripheral vascular disease ([place])   . Squamous cell carcinoma of skin of right calf Aug. 2015  . TIA (transient ischemic attack)    "several at once; none in a long time" (09/05/2016)    Family History  Problem Relation Age of Onset  . Heart disease Father     Heart Disease before age 45  . Hyperlipidemia Father   . Hypertension Father   . Alcohol abuse Father   . Heart disease Brother   . Hyperlipidemia Brother   . Hypertension Brother   . Deep vein thrombosis Brother   . Heart disease Son     Heart  Disease before age 10  . Hyperlipidemia Son   . Hypertension Son   . Heart attack Son   . Diabetes Son   . Hypertension Son   . Hyperlipidemia Sister   . Hypertension Sister     SOCIAL HISTORY: Social History  Substance Use Topics  . Smoking status: Former Smoker    Types: Cigarettes    Quit date: 11/17/1946  . Smokeless tobacco: Never Used  . Alcohol use No    Allergies  Allergen Reactions  . Motrin [Ibuprofen] Other (See Comments)     GI bleeding  . Statins Other (See Comments)    Pain and muscle weakness  . Promethazine Hcl Other (See Comments)    IV  Drug only, makes her act crazy  . Sulfa Antibiotics Nausea And Vomiting    Current Outpatient Prescriptions  Medication Sig Dispense Refill  . acetaminophen (TYLENOL) 500 MG tablet Take 1,000 mg by mouth 2 (two) times daily as needed for moderate pain or headache. Patient took this medication for her pain.    Marland Kitchen aspirin 81 MG tablet Take 81 mg by mouth daily.    . Cholecalciferol (VITAMIN D3) 2000 UNITS TABS Take 2,000 Units by mouth at bedtime.     . clopidogrel (PLAVIX) 75 MG tablet Take 75 mg by mouth daily.    . Cyanocobalamin (VITAMIN B-12 PO) Take 1 tablet by mouth daily.    . diazepam (VALIUM) 5 MG tablet Take 2.5 mg by mouth at bedtime as needed for anxiety.     . gabapentin (NEURONTIN) 300 MG capsule Take 300 mg by mouth 2 (two) times daily.     Marland Kitchen loperamide (IMODIUM) 2 MG capsule Take 2 mg by mouth as needed for diarrhea or loose stools.    . metoprolol (LOPRESSOR) 50 MG tablet Take 25-50 mg by mouth 2 (two) times daily. Take 50mg  by mouth in the morning and take 25mg  by mouth at bedtime.    . Multiple Vitamins-Minerals (EYE VITAMINS PO) Take 1 tablet by mouth at bedtime.     . traMADol (ULTRAM) 50 MG tablet Take 50 mg by mouth at bedtime as needed for moderate pain.     . valsartan-hydrochlorothiazide (DIOVAN-HCT) 160-12.5 MG per tablet Take 0.5 tablets by mouth daily.     No current facility-administered medications for this visit.     REVIEW OF SYSTEMS:  [X]  denotes positive finding, [ ]  denotes negative finding Cardiac  Comments:  Chest pain or chest pressure:    Shortness of breath upon exertion:    Short of breath when lying flat:    Irregular heart rhythm:        Vascular    Pain in calf, thigh, or hip brought on by ambulation: x   Pain in feet at night that wakes you up from your sleep:  x   Blood clot in your veins:    Leg swelling:  x         Pulmonary    Oxygen at home:    Productive cough:     Wheezing:         Neurologic    Sudden weakness in arms or legs:     Sudden numbness in arms or legs:     Sudden onset of difficulty speaking or slurred speech:    Temporary loss of vision in one eye:     Problems with dizziness:         Gastrointestinal    Blood in stool:  Vomited blood:         Genitourinary    Burning when urinating:     Blood in urine:        Psychiatric    Major depression:         Hematologic    Bleeding problems:    Problems with blood clotting too easily:        Skin    Rashes or ulcers:        Constitutional    Fever or chills:      PHYSICAL EXAM: Vitals:   09/15/16 1243  BP: (!) 170/74  Pulse: 62  Resp: 14  Temp: 97 F (36.1 C)  TempSrc: Oral  SpO2: 94%  Weight: 128 lb (58.1 kg)  Height: 5\' 2"  (1.575 m)    GENERAL: The patient is a well-nourished female, in no acute distress. The vital signs are documented above. CARDIAC: There is a regular rate and rhythm.  VASCULAR: Nonpalpable pulses in the left leg PULMONARY: There is good air exchange bilaterally without wheezing or rales. ABDOMEN: Soft and non-tender with normal pitched bowel sounds.  MUSCULOSKELETAL: There are no major deformities or cyanosis. NEUROLOGIC: No focal weakness or paresthesias are detected. SKIN: There are no ulcers or rashes noted. PSYCHIATRIC: The patient has a normal affect.  DATA:  None  MEDICAL ISSUES: The patient is suffering from bilateral rest pain which keeps her up most nights.  Her symptoms have improved on the right after percutaneous intervention.  She is here today to discuss options for management of the pain in her left leg.  We described 3 options.  The first of the pain control and non-operative treatment.  The second would be surgical bypass, and the third would be amputation.  After lengthy discussion with the patient and her family, we have elected to proceed with a left  femoral-popliteal bypass graft.  I doubt that she has any vein remaining in the left leg, therefore we would do this with Gore-Tex.  It is been scheduled for Wednesday, November 8.  She will stop her Plavix 5 days before the operation.  She is going to get cardiac clearance.    [name], MD [facility] [phone] Pager [phone]

## 2016-09-24 NOTE — Progress Notes (Signed)
Report to R. Roberts RN as primary caregiver. 

## 2016-09-24 NOTE — Interval H&P Note (Signed)
History and Physical Interval Note:  09/24/2016 6:55 AM  [name]  has presented today for surgery, with the diagnosis of Peripheral vascular disease with bilateral lower extremity rest pain I70.223  The various methods of treatment have been discussed with the patient and family. After consideration of risks, benefits and other options for treatment, the patient has consented to  Procedure(s): REDO BYPASS GRAFT FEMORAL-POPLITEAL ARTERY (Left) as a surgical intervention .  The patient's history has been reviewed, patient examined, no change in status, stable for surgery.  I have reviewed the patient's chart and labs.  Questions were answered to the patient's satisfaction.     [name]

## 2016-09-24 NOTE — Op Note (Signed)
Patient name: [name] MRN: [id number] DOB: 10/03/1928 Sex: female  09/24/2016 Pre-operative Diagnosis: Ischemic rest pain, left leg Post-operative diagnosis:  Same Surgeon:  [name] Assistants:  Silva Bandy Procedure:   #1: Redo left femoral to above-knee popliteal artery bypass graft with 6 mm propatent external ring PTFE   #2: Left common femoral, external iliac, and profunda femoral endarterectomy Anesthesia:  Gen. Blood Loss:  See anesthesia record Specimens:  None  Findings:  The common femoral artery had a thickened intima and plaque which required endarterectomy in order to perform the proximal anastomosis.  I performed an endarterectomy of the distal external iliac artery, common femoral, and proximal profunda femoral artery.  The vein graft and the above-knee incision from the previous above-knee to below-knee popliteal artery bypass graft was densely scarred and.  In addition the vein appeared somewhat sclerotic.  I elected not to open the below knee pop with Teal incision because of the appearance of the bloody popliteal artery and tibial vessels on angiography.  The distal anastomosis was a end-to-side anastomosis to the vein graft just beyond its origin.  Indications:  The patient is suffering from ischemic rest pain to her left leg.  She is previously undergone a above-knee to below-knee popliteal artery bypass graft with vein for rest pain.  She developed a anastomotic stenosis which was treated with stenting.  She has undergone multiple procedures on her native superficial femoral artery proximal to her bypass graft.  Her most recent arteriogram showed occlusion of the superficial femoral artery however the bypass graft remained patent.  She comes in today for revascularization as she cannot tolerate the level of discomfort she is having.  Procedure:  The patient was identified in the holding area and taken to [facility]  The patient was then placed supine on the  table. general anesthesia was administered.  The patient was prepped and draped in the usual sterile fashion.  A time out was called and antibiotics were administered.  A longitudinal incision was made in the left groin.  Cautery just above simultaneous tissue down the femoral sheath which was opened sharply.  The common femoral artery was exposed from the inguinal ligament down to the bifurcation.  The profunda and superficial femoral artery were individually isolated.  It was difficult to feel a pulse within the artery because of the thickening.  The patient had 2 previous cannulation sites with scar tissue anteriorly which began to bleed when doing the dissection.  These were closed with 5-0 Prolene.  Next, the medial above-knee incision was opened with cautery.  Using cautery to perform dissection I entered the popliteal space.  It was difficult to sort out the anatomy but ultimately I was able to identify the above-knee to below-knee vein graft.  I dissected this back out to its origin.  The vein graft was heavily calcified.  Next a long Gore tunneler was used to create a tunnel in a subsartorial plane.  The patient was fully heparinized.  After the heparin circulated the common femoral profunda femoral and superficial femoral artery were individually isolated.  A #11 blade was used to make an arteriotomy in the common femoral artery.  I opened the artery out onto the profunda for approximately 1 cm.  The plaque within the common femoral artery was very thick and I did not feel like it adequately do the proximal anastomosis without performing endarterectomy.  I used a Psychologist, counselling to dissect out the plaque.  The plaque went up into the pelvis under the inguinal ligament.  Therefore I inserted a #5 Fogarty catheter with the balloon on it I blew the balloon up and then used hemostats to complete the endarterectomy of the distal external iliac and common femoral artery.  I continue with endarterectomy  down into the proximal profunda femoral artery were was able to get a good endpoint.  I did not need to place any tacking sutures.  A 6 mm external ring propatent Gore-Tex graft was brought onto the field and bevel to fit the size of the arteriotomy which was about 2 cm long.  The proximal anastomosis was performed with CV 6 Gore-Tex suture in running fashion.  Prior to completion the appropriate flushing maneuvers were performed and the anastomosis was completed.  When this was tested there was significant needle hole bleeding.  I placed 2 pledgeted sutures which helped with hemostasis.  I also used CoSeal.  The graft was then brought through the previously created tunnel.  Then occluded the vein graft.  A #11 blade was used to make an arteriotomy which was extended longitudinally within the vein graft just beyond the anastomosis for a distance of approximately 2 cm.  The vein appeared to be very sclerotic.  I passed a #5 Fogarty catheter which went through the vein graft across the distal anastomosis.  It was somewhat tight.  There was no thrombus within the graft.  At this point I elected to proceed with the distal anastomosis rather than jumping down to the popliteal artery, based on her previous infection from this incision as well as the appearance of her popliteal and anterior tibial artery on angiography.  The distal anastomosis was then created with running CV 6 suture.  Prior to completion the prone flushing maneuvers were performed and the anastomosis was completed.  Hand-held Doppler was used to evaluate the signal in the anterior tibial artery which was biphasic and graft dependent.  50 mg protamine was administered.  Once hemostasis was satisfactory the groin incision was closed by reapproximating the femoral sheath with 2-0 Vicryl.  Subcutaneous tissue was then closed with a distal layers of 3-0 Vicryl followed by 4-0 Vicryl the skin.  The medial below-knee incision was closed by reapproximating the  fascia with 2-0 Vicryl the subcutaneous tissue with 3-0 Vicryl the skin with 4-0 Vicryl.  Dermabond was applied.  There were no immediate complications.   Disposition:  To PACU in stable condition.   Theotis Burrow, M.D. [facility] [place] Office: [phone] Pager:  [phone]

## 2016-09-24 NOTE — Anesthesia Postprocedure Evaluation (Signed)
Anesthesia Post Note  Patient: [name]  Procedure(s) Performed: Procedure(s) (LRB): REDO FEMORAL TO POPLITEAL ARTERY BYPASS GRAFT USING 6MM PROPATEN RINGED GORTEX GRAFT (Left)  Patient location during evaluation: PACU Anesthesia Type: General Level of consciousness: awake and alert Pain management: pain level controlled Vital Signs Assessment: post-procedure vital signs reviewed and stable Respiratory status: spontaneous breathing, nonlabored ventilation and respiratory function stable Cardiovascular status: blood pressure returned to baseline and stable Postop Assessment: no signs of nausea or vomiting Anesthetic complications: no    Last Vitals:  Vitals:   09/24/16 1405 09/24/16 1420  BP: (!) 104/49 (!) 109/48  Pulse: 82 80  Resp: 18 17  Temp:      Last Pain:  Vitals:   09/24/16 1350  TempSrc:   PainSc: 0-No pain                 Saylor Sheckler,W. EDMOND

## 2016-09-24 NOTE — Anesthesia Procedure Notes (Signed)
Procedure Name: Intubation Date/Time: 09/24/2016 8:39 AM Performed by: Salli Quarry Lafreda Casebeer Pre-anesthesia Checklist: Patient identified, Emergency Drugs available, Suction available and Patient being monitored Patient Re-evaluated:Patient Re-evaluated prior to inductionOxygen Delivery Method: Circle System Utilized Preoxygenation: Pre-oxygenation with 100% oxygen Intubation Type: IV induction Ventilation: Mask ventilation without difficulty and Oral airway inserted - appropriate to patient size Laryngoscope Size: Miller and 2 Grade View: Grade I Tube type: Oral Tube size: 7.0 mm Number of attempts: 1 Airway Equipment and Method: Stylet and Oral airway Placement Confirmation: ETT inserted through vocal cords under direct vision,  positive ETCO2 and breath sounds checked- equal and bilateral Secured at: 20 cm Tube secured with: Tape Dental Injury: Teeth and Oropharynx as per pre-operative assessment

## 2016-09-25 ENCOUNTER — Encounter (HOSPITAL_COMMUNITY): Payer: Self-pay | Admitting: Surgery

## 2016-09-25 LAB — BASIC METABOLIC PANEL
ANION GAP: 9 (ref 5–15)
BUN: 16 mg/dL (ref 6–20)
CHLORIDE: 104 mmol/L (ref 101–111)
CO2: 22 mmol/L (ref 22–32)
Calcium: 8.6 mg/dL — ABNORMAL LOW (ref 8.9–10.3)
Creatinine, Ser: 1.01 mg/dL — ABNORMAL HIGH (ref 0.44–1.00)
GFR calc non Af Amer: 49 mL/min — ABNORMAL LOW (ref >60)
GFR, EST AFRICAN AMERICAN: 57 mL/min — AB (ref >60)
GLUCOSE: 126 mg/dL — AB (ref 65–99)
Potassium: 4 mmol/L (ref 3.5–5.1)
Sodium: 135 mmol/L (ref 135–145)

## 2016-09-25 LAB — CBC
HEMATOCRIT: 27.4 % — AB (ref 36.0–46.0)
HEMOGLOBIN: 8.9 g/dL — AB (ref 12.0–15.0)
MCH: 30.1 pg (ref 26.0–34.0)
MCHC: 32.5 g/dL (ref 30.0–36.0)
MCV: 92.6 fL (ref 78.0–100.0)
Platelets: 251 10*3/uL (ref 150–400)
RBC: 2.96 MIL/uL — ABNORMAL LOW (ref 3.87–5.11)
RDW: 14.5 % (ref 11.5–15.5)
WBC: 8.3 10*3/uL (ref 4.0–10.5)

## 2016-09-25 MED ORDER — HYDROCHLOROTHIAZIDE 12.5 MG PO CAPS
12.5000 mg | ORAL_CAPSULE | Freq: Every day | ORAL | Status: DC
  Administered 2016-09-29: 12.5 mg via ORAL
  Filled 2016-09-25 (×3): qty 1

## 2016-09-25 MED ORDER — IRBESARTAN 150 MG PO TABS
150.0000 mg | ORAL_TABLET | Freq: Every day | ORAL | Status: DC
  Administered 2016-09-27 – 2016-09-28 (×2): 150 mg via ORAL
  Filled 2016-09-25 (×4): qty 1

## 2016-09-25 NOTE — Progress Notes (Signed)
Initial Nutrition Assessment  DOCUMENTATION CODES:   Not applicable  INTERVENTION:   - Provided Boost Breeze oral nutrition supplement to pt at time of visit. Each provides 250 kcal and 9 grams protein. Pt does not want to receive oral supplements. - Encouraged PO intake.  NUTRITION DIAGNOSIS:   Inadequate oral intake related to poor appetite as evidenced by per patient/family report.  GOAL:   Patient will meet greater than or equal to 90% of their needs  MONITOR:   PO intake, Labs, Weight trends, I & O's, Skin  REASON FOR ASSESSMENT:   Malnutrition Screening Tool   ASSESSMENT:   82 y.o. female with PMH of HTN, DVT, peripheral vascular disease, GERD, stroke. Pt presents to hospital for surgery. Pt is s/p bypass graft and endartectomy on 11/8.  Spoke with pt and family at bedside. Pt reports poor appetite for past month due to pain. [name] reports pt does not eat breakfast and has to be "forced" to eat lunch and dinner. [name] reveals pt dislikes the food on her tray and will not eat it. [name] plans to bring outside food to pt. Pt states she enjoys consuming soup. Encouraged pt to order soup from menu during hospital stay. Provided pt with Boost Breeze oral nutrition supplement at time of visit as pt does not like other oral nutrition supplements. Pt did not like supplement and prefers not to have supplements sent to her.  [name] reports pt lives in her own home but that pt's daughter grocery shops and cooks for pt and pt's husband who is on a low-sodium diet. [name] aware of need to reduce sodium in pt's diet and states she uses herbs and spices in cooking but that pt still adds salt to food at table.  Pt reports limited ambulation at home due to pain and numbness in legs. [name] states she helps pt get to the bathroom, take a bath, etc. Pt states she has lost "3-4 pounds" and that her UBW is 130-131#. Per wt history in chart, pt's weight  has been stable over time.  Medications reviewed and include 75 mg Plavix daily, 100 mg Colace daily, 50 mg Lopressor daily, 25 mg Lopressor daily, 40 mg Protonix daily, PRN magnesium sulfate, PRN Zofran, PRN potassium chloride  Labs reviewed and include elevated creatinine (1.01 mg/dL), low calcium (8.6 mg/dL), low hemoglobin (8.9 g/dL)  Limited NFPE completed per pt request not to have her legs touched. Findings are no fat depletion, moderate muscle depletion, and unable to assess edema.  Diet Order:  Diet Heart Room service appropriate? Yes; Fluid consistency: Thin  Skin:  Wound (see comment) (closed incisions to left leg)  Last BM:  09/23/16  Height:   Ht Readings from Last 1 Encounters:  09/24/16 5\' 2"  (1.575 m)    Weight:   Wt Readings from Last 1 Encounters:  09/24/16 134 lb 7.7 oz (61 kg)    Ideal Body Weight:  50 kg  BMI:  Body mass index is 24.6 kg/m.  Estimated Nutritional Needs:   Kcal:  1400-1600 (23-26 kcal/kg)  Protein:  80-95 grams (1.3-1.5 g/kg)  Fluid:  1.4-1.6 L/day  EDUCATION NEEDS:   No education needs identified at this time  Jeb Levering Dietetic Intern Pager Number: [phone]

## 2016-09-25 NOTE — Evaluation (Signed)
Physical Therapy Evaluation Patient Details Name: [name] MRN: [id number] DOB: June 21, 1928 Today's Date: 09/25/2016   History of Present Illness  S/p left fem pop artery bypass  Clinical Impression  Patient presents with pain and post surgical deficits s/p above impacting mobility. Tolerated gait training with Min guard assist for safety. Requires assist to stand from low surfaces due to inability to place >5 lbs through left elbow (per pt report). Instructed pt in exercises and stretches to perform during the day. Encouraged ambulation multiple times per day with nursing. Will follow acutely to maximize independence and mobility prior to return home.    Follow Up Recommendations Home health PT;Supervision for mobility/OOB    Equipment Recommendations  None recommended by PT    Recommendations for Other Services       Precautions / Restrictions Precautions Precautions: Fall Precaution Comments: numbness B feet Restrictions Weight Bearing Restrictions: Yes LLE Weight Bearing: Weight bearing as tolerated      Mobility  Bed Mobility               General bed mobility comments: up in chair upon PT arrival.   Transfers Overall transfer level: Needs assistance Equipment used: Rolling walker (2 wheeled) Transfers: Sit to/from Stand Sit to Stand: Min assist         General transfer comment: Daughter helping pt despite cues to to let her do it independently; reports she cannot "put more than 5 lbs through her left elbow."  Ambulation/Gait Ambulation/Gait assistance: Min guard Ambulation Distance (Feet): 120 Feet Assistive device: Rolling walker (2 wheeled) Gait Pattern/deviations: Step-through pattern;Decreased stride length;Drifts right/left;Decreased stance time - left Gait velocity: decreased   General Gait Details: Slow, guarded gait with decreased stance time LLE. Left knee instability at times but no buckling. Decreased weight left arm due to pt reported  precautions  Stairs            Wheelchair Mobility    Modified Rankin (Stroke Patients Only)       Balance Overall balance assessment: Needs assistance Sitting-balance support: Feet supported Sitting balance-Leahy Scale: Good     Standing balance support: During functional activity Standing balance-Leahy Scale: Poor Standing balance comment: Reliant on UE support for balance.                              Pertinent Vitals/Pain Pain Assessment: Faces Pain Score: 5  Faces Pain Scale: Hurts little more Pain Location: LLE Pain Descriptors / Indicators: Sore;Aching Pain Intervention(s): Monitored during session;Repositioned;Premedicated before session    Home Living Family/patient expects to be discharged to:: Private residence Living Arrangements: Children Available Help at Discharge: Family;Available 24 hours/day Type of Home: House Home Access: Stairs to enter;Ramped entrance Entrance Stairs-Rails: Right Entrance Stairs-Number of Steps: 4 Home Layout: One level Home Equipment: Walker - 2 wheels;Cane - single point;Bedside commode;Grab bars - tub/shower;Wheelchair - manual      Prior Function Level of Independence: Needs assistance;Independent with assistive device(s)   Gait / Transfers Assistance Needed: uses RW  ADL's / Homemaking Assistance Needed: daughter assists with ADL as needed. Pt has BUE ROM limitations        Hand Dominance   Dominant Hand: Right    Extremity/Trunk Assessment   Upper Extremity Assessment: Defer to OT evaluation           Lower Extremity Assessment: LLE deficits/detail   LLE Deficits / Details: Able to perform SLR without difficulty.   Cervical /  Trunk Assessment: Normal  Communication   Communication: No difficulties  Cognition Arousal/Alertness: Awake/alert Behavior During Therapy: WFL for tasks assessed/performed Overall Cognitive Status: Within Functional Limits for tasks assessed                       General Comments General comments (skin integrity, edema, etc.): Daughter and son present during session.    Exercises General Exercises - Lower Extremity Ankle Circles/Pumps: Both;10 reps;Seated Quad Sets: Left;10 reps;Seated Other Exercises Other Exercises: Manual stretch of hamstrings/gastroc 30 sec   Assessment/Plan    PT Assessment Patient needs continued PT services  PT Problem List Decreased strength;Decreased mobility;Decreased balance;Pain;Impaired sensation;Decreased skin integrity;Decreased activity tolerance          PT Treatment Interventions DME instruction;Therapeutic activities;Gait training;Therapeutic exercise;Patient/family education;Functional mobility training;Balance training;Stair training    PT Goals (Current goals can be found in the Care Plan section)  Acute Rehab PT Goals Patient Stated Goal: return to PLOF and not be in pain PT Goal Formulation: With patient Time For Goal Achievement: 10/09/16 Potential to Achieve Goals: Good    Frequency Min 3X/week   Barriers to discharge        Co-evaluation               End of Session Equipment Utilized During Treatment: Gait belt Activity Tolerance: Patient tolerated treatment well Patient left: in chair;with call bell/phone within reach;with family/visitor present Nurse Communication: Mobility status         Time: [id number] PT Time Calculation (min) (ACUTE ONLY): 16 min   Charges:   PT Evaluation $PT Eval Moderate Complexity: 1 Procedure     PT G Codes:        Tephanie Escorcia A Tangala Wiegert 09/25/2016, 1:53 PM Wray Kearns, [place], DPT [phone]

## 2016-09-25 NOTE — Progress Notes (Addendum)
  [facility] Progress Note  Subjective  - POD #1  Having pain with groin incision. Left foot still numb. Can't tell if pain is better.   Objective Vitals:   09/25/16 0020 09/25/16 0345  BP: (!) 112/45 (!) 110/49  Pulse: 83 94  Resp: 16 (!) 23  Temp:  99.6 F (37.6 C)    Intake/Output Summary (Last 24 hours) at 09/25/16 0728 Last data filed at 09/25/16 [id number]  Gross per 24 hour  Intake             3425 ml  Output             1175 ml  Net             2250 ml   Left groin and above knee incisions intact. No hematoma. Brisk biphasic AT and PT doppler flow.  Assessment/Planning: 82 y.o. female is s/p: Redo left femoral to above-knee popliteal artery bypass graft with 6 mm propatent external ring PTFE  1 Day Post-Op   Doing well post-op. Having some pain with groin incision. Bypass patent. SBP stable 100s-110s. Mobilize today. Transfer to [place] 09/25/2016 7:28 AM --  Laboratory CBC    Component Value Date/Time   WBC 8.3 09/25/2016 0519   HGB 8.9 (L) 09/25/2016 0519   HCT 27.4 (L) 09/25/2016 0519   PLT 251 09/25/2016 0519    BMET    Component Value Date/Time   NA 135 09/25/2016 0519   K 4.0 09/25/2016 0519   CL 104 09/25/2016 0519   CO2 22 09/25/2016 0519   GLUCOSE 126 (H) 09/25/2016 0519   BUN 16 09/25/2016 0519   CREATININE 1.01 (H) 09/25/2016 0519   CALCIUM 8.6 (L) 09/25/2016 0519   GFRNONAA 49 (L) 09/25/2016 0519   GFRAA 57 (L) 09/25/2016 0519    COAG Lab Results  Component Value Date   INR 1.01 09/24/2016   INR 1.01 09/02/2013   INR 0.91 09/01/2013   No results found for: PTT  Antibiotics Anti-infectives    Start     Dose/Rate Route Frequency Ordered Stop   09/25/16 2200  cephALEXin (KEFLEX) capsule 500 mg     500 mg Oral 2 times daily 09/24/16 2049     09/24/16 2100  cefUROXime (ZINACEF) 1.5 g in dextrose 5 % 50 mL IVPB     1.5 g 100 mL/hr over 30 Minutes Intravenous Every 12 hours 09/24/16 2049  09/25/16 2059   09/24/16 0606  cefUROXime (ZINACEF) 1.5 g in dextrose 5 % 50 mL IVPB     1.5 g 100 mL/hr over 30 Minutes Intravenous 30 min pre-op 09/24/16 0606 09/24/16 [place], PA-C [facility] Office: [phone] Pager: [phone] 09/25/2016 7:28 AM

## 2016-09-25 NOTE — Care Management Note (Signed)
Case Management Note  Patient Details  Name: [name] MRN: [id number] Date of Birth: 10-Oct-1928  Subjective/Objective:  S/p left fem pop artery bypass, her daughter lives with her at home and she works, she has pcp Dr. Inda Merlin, she also has a rolling walker and a w/chair at home, she has transportation at dc..  She has medication coverage thru [employer], She has already been pre set up with [facility] for [place], [place], and HHPT.  Tiffany with [facility] is aware.  Patient is for poss dc tomorrow.  NCM will cont to follow for dc needs.                   Action/Plan:   Expected Discharge Date:                  Expected Discharge Plan:  [place]  In-House Referral:     Discharge planning Services  CM Consult  Post Acute Care Choice:  Home Health Choice offered to:  Patient  DME Arranged:    DME Agency:     HH Arranged:  RN, PT, OT HH Agency:  [facility]  Status of Service:  Completed, signed off  If discussed at [place] of Stay Meetings, dates discussed:    Additional Comments:  Zenon Mayo, RN 09/25/2016, 10:29 AM

## 2016-09-25 NOTE — Progress Notes (Signed)
Patient being transferred to 2S room 01. Report called to charge RN as accepting nurse was not available.

## 2016-09-25 NOTE — Progress Notes (Signed)
Occupational Therapy Evaluation Patient Details Name: [name] MRN: [id number] DOB: 1928-04-02 Today's Date: 09/25/2016    History of Present Illness S/p left fem pop artery bypass   Clinical Impression   PTA, pt mod I with mobility and had min A with ADL due to BUE AROM limitations. Pt making good progress. Able to ambulate to bathroom @ RW level with min A. Complaining of incisional pain which improved with mobility. Encourage pt to ambulate with staff. Recommend HHOT as follow up to help pt return to PLOF.     Follow Up Recommendations  Home health OT;Supervision/Assistance - 24 hour    Equipment Recommendations  Tub/shower seat (daughter will most likely purchase)    Recommendations for Other Services       Precautions / Restrictions Precautions Precautions: Fall Precaution Comments: numbness B feet Restrictions LLE Weight Bearing: Weight bearing as tolerated      Mobility Bed Mobility    min A sit - supine to A with trunk              Transfers Overall transfer level: Needs assistance          MIn A Pt slow due to pain. Daughter nervous about pt putting weight through RUE due to elbow replacement > 2 yrs ago          Balance Overall balance assessment: Needs assistance   Sitting balance-Leahy Scale: Good       Standing balance-Leahy Scale: Fair                              ADL Overall ADL's : Needs assistance/impaired Eating/Feeding: Set up Eating/Feeding Details (indicate cue type and reason): Pt has difficulty holding utensils at times due to arthritis Grooming: Minimal assistance Grooming Details (indicate cue type and reason): unable to reach head Upper Body Bathing: Set up   Lower Body Bathing: Minimal assistance   Upper Body Dressing : Minimal assistance   Lower Body Dressing: Moderate assistance   Toilet Transfer: Minimal assistance;Ambulation;BSC   Toileting- Clothing Manipulation and Hygiene: Set up       Functional mobility during ADLs: Minimal assistance;Rolling walker;Cueing for safety General ADL Comments: daughter assisting     Vision     Perception     Praxis      Pertinent Vitals/Pain Pain Assessment: 0-10 Pain Score: 5  Pain Location: LLE Pain Descriptors / Indicators: Aching;Burning;Discomfort Pain Intervention(s): Limited activity within patient's tolerance;Patient requesting pain meds-RN notified     Hand Dominance Right   Extremity/Trunk Assessment Upper Extremity Assessment Upper Extremity Assessment: Generalized weakness (at baseline pt with BUE shoulder AROM limitations; )   Lower Extremity Assessment Lower Extremity Assessment: Generalized weakness;Defer to PT evaluation   Cervical / Trunk Assessment Cervical / Trunk Assessment: Normal   Communication Communication Communication: No difficulties   Cognition Arousal/Alertness: Awake/alert Behavior During Therapy: WFL for tasks assessed/performed Overall Cognitive Status: Within Functional Limits for tasks assessed                     General Comments       Exercises       Shoulder Instructions      Home Living Family/patient expects to be discharged to:: Private residence Living Arrangements: Children Available Help at Discharge: Family;Available 24 hours/day Type of Home: House Home Access: Stairs to enter;Ramped entrance Entrance Stairs-Number of Steps: 4 Entrance Stairs-Rails: Right Home Layout: One level  Bathroom Shower/Tub: Occupational psychologist: Standard Bathroom Accessibility: Yes How Accessible: Accessible via walker Home Equipment: [place] - 2 wheels;Cane - single point;Bedside commode;Grab bars - tub/shower;Wheelchair - manual          Prior Functioning/Environment Level of Independence: Needs assistance;Independent with assistive device(s)  Gait / Transfers Assistance Needed: uses RW ADL's / Homemaking Assistance Needed: daughter assists with  ADL as needed. Pt has BUE ROM limitations            OT Problem List: Decreased strength;Decreased range of motion;Decreased activity tolerance;Impaired balance (sitting and/or standing);Decreased safety awareness;Decreased knowledge of use of DME or AE;Pain;Impaired UE functional use   OT Treatment/Interventions: Self-care/ADL training;Therapeutic exercise;DME and/or AE instruction;Therapeutic activities;Patient/family education;Balance training    OT Goals(Current goals can be found in the care plan section) Acute Rehab OT Goals Patient Stated Goal: return to PLOF and not be in pain OT Goal Formulation: With patient Time For Goal Achievement: 10/09/16 Potential to Achieve Goals: Good ADL Goals Pt Will Transfer to Toilet: with min guard assist;ambulating;bedside commode (caregiver independent in assisting) Pt Will Perform Tub/Shower Transfer: Shower transfer;with caregiver independent in assisting;with min guard assist;shower seat;rolling walker  OT Frequency: Min 2X/week   Barriers to D/C:            Co-evaluation              End of Session Equipment Utilized During Treatment: Gait belt;Rolling walker Nurse Communication: Mobility status  Activity Tolerance: Patient tolerated treatment well Patient left: in chair;with call bell/phone within reach;with chair alarm set;with family/visitor present   Time: 1120-1145 OT Time Calculation (min): 25 min Charges:  OT General Charges $OT Visit: 1 Procedure OT Evaluation $OT Eval Moderate Complexity: 1 Procedure OT Treatments $Self Care/Home Management : 8-22 mins G-Codes:    Laquitha Heslin,HILLARY 10-19-16, 12:36 PM   [facility], OTR/L  [phone] 10-19-16

## 2016-09-26 ENCOUNTER — Inpatient Hospital Stay (HOSPITAL_COMMUNITY): Payer: Medicare Other | Admitting: Anesthesiology

## 2016-09-26 ENCOUNTER — Inpatient Hospital Stay (HOSPITAL_COMMUNITY): Payer: Medicare Other

## 2016-09-26 ENCOUNTER — Encounter (HOSPITAL_COMMUNITY)
Admission: RE | Disposition: A | Discharge status: Home Health | Payer: Self-pay | Source: Ambulatory Visit | Attending: Surgery

## 2016-09-26 DIAGNOSIS — I739 Peripheral vascular disease, unspecified: Secondary | ICD-10-CM

## 2016-09-26 HISTORY — PX: THROMBECTOMY FEMORAL ARTERY: SHX6406

## 2016-09-26 HISTORY — PX: AORTOGRAM: SHX6300

## 2016-09-26 HISTORY — PX: INSERTION OF ILIAC STENT: SHX6256

## 2016-09-26 LAB — BASIC METABOLIC PANEL WITH GFR
Anion gap: 7 (ref 5–15)
BUN: 13 mg/dL (ref 6–20)
CO2: 24 mmol/L (ref 22–32)
Calcium: 8.6 mg/dL — ABNORMAL LOW (ref 8.9–10.3)
Chloride: 104 mmol/L (ref 101–111)
Creatinine, Ser: 0.92 mg/dL (ref 0.44–1.00)
GFR calc Af Amer: >60 mL/min (ref >60)
GFR calc non Af Amer: 55 mL/min — ABNORMAL LOW (ref >60)
Glucose, Bld: 103 mg/dL — ABNORMAL HIGH (ref 65–99)
Potassium: 3.8 mmol/L (ref 3.5–5.1)
Sodium: 135 mmol/L (ref 135–145)

## 2016-09-26 LAB — CBC
HCT: 26.6 % — ABNORMAL LOW (ref 36.0–46.0)
HEMOGLOBIN: 8.7 g/dL — AB (ref 12.0–15.0)
MCH: 30.2 pg (ref 26.0–34.0)
MCHC: 32.7 g/dL (ref 30.0–36.0)
MCV: 92.4 fL (ref 78.0–100.0)
PLATELETS: 208 10*3/uL (ref 150–400)
RBC: 2.88 MIL/uL — AB (ref 3.87–5.11)
RDW: 14.6 % (ref 11.5–15.5)
WBC: 8.4 10*3/uL (ref 4.0–10.5)

## 2016-09-26 LAB — VAS US LOWER EXTREMITY ARTERIAL DUPLEX
Right popliteal dist sys PSV: 16 cm/s
Right super femoral prox sys PSV: -14 cm/s

## 2016-09-26 LAB — PREPARE RBC (CROSSMATCH)

## 2016-09-26 SURGERY — LOWER EXTREMITY ANGIOGRAM
Anesthesia: General | Site: Leg Upper

## 2016-09-26 SURGERY — THROMBECTOMY, ARTERY, FEMORAL
Anesthesia: General | Laterality: Right

## 2016-09-26 MED ORDER — SODIUM CHLORIDE 0.9 % IV SOLN: 1.0000 mL/kg/h | INTRAVENOUS | Status: AC

## 2016-09-26 MED ORDER — DEXTROSE 5 % IV SOLN
1.5000 g | INTRAVENOUS | Status: AC
  Administered 2016-09-26: 1.5 g via INTRAVENOUS
  Filled 2016-09-26 (×2): qty 1.5

## 2016-09-26 MED ORDER — HEPARIN SODIUM (PORCINE) 1000 UNIT/ML IJ SOLN
INTRAMUSCULAR | Status: DC | PRN
  Administered 2016-09-26 (×2): 3000 [IU] via INTRAVENOUS
  Administered 2016-09-26: 6000 [IU] via INTRAVENOUS

## 2016-09-26 MED ORDER — ROCURONIUM BROMIDE 100 MG/10ML IV SOLN
INTRAVENOUS | Status: DC | PRN
  Administered 2016-09-26 (×3): 10 mg via INTRAVENOUS
  Administered 2016-09-26: 40 mg via INTRAVENOUS
  Administered 2016-09-26: 10 mg via INTRAVENOUS

## 2016-09-26 MED ORDER — MIDAZOLAM HCL 2 MG/2ML IJ SOLN
INTRAMUSCULAR | Status: AC
  Filled 2016-09-26: qty 2

## 2016-09-26 MED ORDER — FENTANYL CITRATE (PF) 100 MCG/2ML IJ SOLN
INTRAMUSCULAR | Status: AC
  Administered 2016-09-26: 50 ug via INTRAVENOUS
  Filled 2016-09-26: qty 2

## 2016-09-26 MED ORDER — ARTIFICIAL TEARS OP OINT
TOPICAL_OINTMENT | OPHTHALMIC | Status: AC
  Filled 2016-09-26: qty 3.5

## 2016-09-26 MED ORDER — PROPOFOL 10 MG/ML IV BOLUS
INTRAVENOUS | Status: DC | PRN
  Administered 2016-09-26: 80 mg via INTRAVENOUS

## 2016-09-26 MED ORDER — SUGAMMADEX SODIUM 200 MG/2ML IV SOLN
INTRAVENOUS | Status: DC | PRN
  Administered 2016-09-26: 120 mg via INTRAVENOUS

## 2016-09-26 MED ORDER — PROTAMINE SULFATE 10 MG/ML IV SOLN
INTRAVENOUS | Status: AC
  Filled 2016-09-26: qty 5

## 2016-09-26 MED ORDER — FENTANYL CITRATE (PF) 100 MCG/2ML IJ SOLN
INTRAMUSCULAR | Status: DC | PRN
  Administered 2016-09-26 (×5): 50 ug via INTRAVENOUS

## 2016-09-26 MED ORDER — HEPARIN SODIUM (PORCINE) 1000 UNIT/ML IJ SOLN
INTRAMUSCULAR | Status: AC
  Filled 2016-09-26: qty 1

## 2016-09-26 MED ORDER — FENTANYL CITRATE (PF) 100 MCG/2ML IJ SOLN
INTRAMUSCULAR | Status: AC
  Filled 2016-09-26: qty 2

## 2016-09-26 MED ORDER — LACTATED RINGERS IV SOLN
INTRAVENOUS | Status: DC
  Administered 2016-09-26 (×2): via INTRAVENOUS

## 2016-09-26 MED ORDER — PHENYLEPHRINE HCL 10 MG/ML IJ SOLN
INTRAVENOUS | Status: DC | PRN
  Administered 2016-09-26: 40 ug/min via INTRAVENOUS

## 2016-09-26 MED ORDER — LIDOCAINE 2% (20 MG/ML) 5 ML SYRINGE
INTRAMUSCULAR | Status: DC | PRN
  Administered 2016-09-26: 40 mg via INTRAVENOUS

## 2016-09-26 MED ORDER — ROCURONIUM BROMIDE 10 MG/ML (PF) SYRINGE
PREFILLED_SYRINGE | INTRAVENOUS | Status: AC
  Filled 2016-09-26: qty 10

## 2016-09-26 MED ORDER — HYDROMORPHONE HCL 1 MG/ML IJ SOLN
0.5000 mg | INTRAMUSCULAR | Status: DC | PRN
  Administered 2016-09-26 – 2016-09-28 (×9): 0.5 mg via INTRAVENOUS
  Filled 2016-09-26 (×9): qty 1

## 2016-09-26 MED ORDER — IODIXANOL 320 MG/ML IV SOLN
INTRAVENOUS | Status: DC | PRN
  Administered 2016-09-26: 115 mL via INTRAVENOUS

## 2016-09-26 MED ORDER — ONDANSETRON HCL 4 MG/2ML IJ SOLN: 4.0000 mg | Freq: Once | INTRAMUSCULAR | Status: DC | PRN

## 2016-09-26 MED ORDER — LACTATED RINGERS IV SOLN
INTRAVENOUS | Status: DC | PRN
  Administered 2016-09-26 (×2): via INTRAVENOUS

## 2016-09-26 MED ORDER — 0.9 % SODIUM CHLORIDE (POUR BTL) OPTIME
TOPICAL | Status: DC | PRN
  Administered 2016-09-26: 1000 mL

## 2016-09-26 MED ORDER — ARTIFICIAL TEARS OP OINT
TOPICAL_OINTMENT | OPHTHALMIC | Status: DC | PRN
  Administered 2016-09-26: 1 via OPHTHALMIC

## 2016-09-26 MED ORDER — FENTANYL CITRATE (PF) 100 MCG/2ML IJ SOLN
25.0000 ug | INTRAMUSCULAR | Status: DC | PRN
  Administered 2016-09-26: 50 ug via INTRAVENOUS

## 2016-09-26 MED ORDER — FENTANYL CITRATE (PF) 250 MCG/5ML IJ SOLN
INTRAMUSCULAR | Status: AC
  Filled 2016-09-26: qty 5

## 2016-09-26 MED ORDER — SODIUM CHLORIDE 0.9 % IV SOLN
INTRAVENOUS | Status: DC | PRN
  Administered 2016-09-26 (×2): 500 mL

## 2016-09-26 MED ORDER — PROTAMINE SULFATE 10 MG/ML IV SOLN
INTRAVENOUS | Status: DC | PRN
  Administered 2016-09-26: 15 mg via INTRAVENOUS
  Administered 2016-09-26: 20 mg via INTRAVENOUS
  Administered 2016-09-26: 5 mg via INTRAVENOUS

## 2016-09-26 SURGICAL SUPPLY — 78 items
ADH SKN CLS APL DERMABOND .7 (GAUZE/BANDAGES/DRESSINGS) ×1
AGENT HMST SPONGE THK3/8 (HEMOSTASIS) ×1
BAG SNAP BAND KOVER 36X36 (MISCELLANEOUS) ×2 IMPLANT
BANDAGE ACE 4X5 VEL STRL LF (GAUZE/BANDAGES/DRESSINGS) IMPLANT
BLADE SURG 11 STRL SS (BLADE) ×1 IMPLANT
CANISTER SUCTION 2500CC (MISCELLANEOUS) ×2 IMPLANT
CATH ANGIO 5F BER2 65CM (CATHETERS) ×1 IMPLANT
CATH OMNI FLUSH .035X70CM (CATHETERS) IMPLANT
CLIP TI MEDIUM 6 (CLIP) IMPLANT
CLIP TI WIDE RED SMALL 6 (CLIP) IMPLANT
COVER DOME SNAP 22 D (MISCELLANEOUS) ×2 IMPLANT
COVER PROBE W GEL 5X96 (DRAPES) ×1 IMPLANT
COVER SURGICAL LIGHT HANDLE (MISCELLANEOUS) ×2 IMPLANT
DERMABOND ADVANCED (GAUZE/BANDAGES/DRESSINGS) ×1
DERMABOND ADVANCED .7 DNX12 (GAUZE/BANDAGES/DRESSINGS) ×1 IMPLANT
DRAIN CHANNEL 15F RND FF W/TCR (WOUND CARE) IMPLANT
DRAPE X-RAY CASS 24X20 (DRAPES) IMPLANT
DRSG COVADERM 4X10 (GAUZE/BANDAGES/DRESSINGS) IMPLANT
DRSG COVADERM 4X8 (GAUZE/BANDAGES/DRESSINGS) IMPLANT
ELECT REM PT RETURN 9FT ADLT (ELECTROSURGICAL) ×2
ELECTRODE REM PT RTRN 9FT ADLT (ELECTROSURGICAL) IMPLANT
EVACUATOR SILICONE 100CC (DRAIN) IMPLANT
GLOVE BIO SURGEON STRL SZ7.5 (GLOVE) ×2 IMPLANT
GLOVE BIOGEL PI IND STRL 6.5 (GLOVE) IMPLANT
GLOVE BIOGEL PI IND STRL 7.5 (GLOVE) IMPLANT
GLOVE BIOGEL PI INDICATOR 6.5 (GLOVE) ×2
GLOVE BIOGEL PI INDICATOR 7.5 (GLOVE) ×1
GLOVE SS BIOGEL STRL SZ 7.5 (GLOVE) IMPLANT
GLOVE SUPERSENSE BIOGEL SZ 7.5 (GLOVE) ×1
GOWN STRL REUS W/ TWL LRG LVL3 (GOWN DISPOSABLE) ×2 IMPLANT
GOWN STRL REUS W/ TWL XL LVL3 (GOWN DISPOSABLE) ×1 IMPLANT
GOWN STRL REUS W/TWL LRG LVL3 (GOWN DISPOSABLE) ×4
GOWN STRL REUS W/TWL XL LVL3 (GOWN DISPOSABLE) ×2
HEMOSTAT SNOW SURGICEL 2X4 (HEMOSTASIS) IMPLANT
HEMOSTAT SPONGE AVITENE ULTRA (HEMOSTASIS) ×1 IMPLANT
KIT BASIN OR (CUSTOM PROCEDURE TRAY) ×2 IMPLANT
KIT ROOM TURNOVER OR (KITS) ×2 IMPLANT
LIQUID BAND (GAUZE/BANDAGES/DRESSINGS) ×1 IMPLANT
MARKER GRAFT CORONARY BYPASS (MISCELLANEOUS) IMPLANT
NDL PERC 18GX7CM (NEEDLE) ×1 IMPLANT
NEEDLE PERC 18GX7CM (NEEDLE) IMPLANT
NS IRRIG 1000ML POUR BTL (IV SOLUTION) ×2 IMPLANT
PACK CV ACCESS (CUSTOM PROCEDURE TRAY) IMPLANT
PACK GENERAL/GYN (CUSTOM PROCEDURE TRAY) ×1 IMPLANT
PACK PERIPHERAL VASCULAR (CUSTOM PROCEDURE TRAY) ×1 IMPLANT
PAD ARMBOARD 7.5X6 YLW CONV (MISCELLANEOUS) ×4 IMPLANT
PADDING CAST ABS 6INX4YD NS (CAST SUPPLIES)
PADDING CAST ABS COTTON 6X4 NS (CAST SUPPLIES) IMPLANT
PATCH VASC XENOSURE 1CMX6CM (Vascular Products) ×2 IMPLANT
PATCH VASC XENOSURE 1X6 (Vascular Products) IMPLANT
PROTECTION STATION PRESSURIZED (MISCELLANEOUS) ×2
SET COLLECT BLD 21X3/4 12 (NEEDLE) IMPLANT
SET COLLECT BLD 21X3/4 12 PB (MISCELLANEOUS) ×1 IMPLANT
SET MICROPUNCTURE 5F STIFF (MISCELLANEOUS) ×1 IMPLANT
SHEATH AVANTI 11CM 5FR (MISCELLANEOUS) ×1 IMPLANT
STATION PROTECTION PRESSURIZED (MISCELLANEOUS) IMPLANT
STOPCOCK 4 WAY LG BORE MALE ST (IV SETS) ×1 IMPLANT
STOPCOCK MORSE 400PSI 3WAY (MISCELLANEOUS) ×1 IMPLANT
SUT ETHILON 3 0 PS 1 (SUTURE) IMPLANT
SUT MNCRL AB 4-0 PS2 18 (SUTURE) ×1 IMPLANT
SUT PROLENE 5 0 C 1 24 (SUTURE) ×2 IMPLANT
SUT PROLENE 6 0 BV (SUTURE) ×2 IMPLANT
SUT PROLENE 7 0 BV 1 (SUTURE) IMPLANT
SUT SILK 2 0 SH (SUTURE) IMPLANT
SUT VIC AB 2-0 CT1 27 (SUTURE) ×2
SUT VIC AB 2-0 CT1 TAPERPNT 27 (SUTURE) IMPLANT
SUT VIC AB 3-0 SH 27 (SUTURE) ×2
SUT VIC AB 3-0 SH 27X BRD (SUTURE) IMPLANT
SYR 20CC LL (SYRINGE) ×1 IMPLANT
SYR 30ML LL (SYRINGE) ×1 IMPLANT
SYR MEDRAD MARK V 150ML (SYRINGE) IMPLANT
SYRINGE 10CC LL (SYRINGE) ×2 IMPLANT
TRAY FOLEY W/METER SILVER 16FR (SET/KITS/TRAYS/PACK) IMPLANT
TUBING EXTENTION W/L.L. (IV SETS) ×1 IMPLANT
TUBING HIGH PRESSURE 120CM (CONNECTOR) ×1 IMPLANT
UNDERPAD 30X30 (UNDERPADS AND DIAPERS) IMPLANT
WATER STERILE IRR 1000ML POUR (IV SOLUTION) IMPLANT
WIRE BENTSON .035X145CM (WIRE) ×1 IMPLANT

## 2016-09-26 SURGICAL SUPPLY — 92 items
ADH SKN CLS APL DERMABOND .7 (GAUZE/BANDAGES/DRESSINGS) ×3
BALLN MUSTANG 5X150X135 (BALLOONS) ×8
BALLN STERLING OTW 3X100X150 (BALLOONS) ×4
BALLN STERLING RX 3X40X135 (BALLOONS) ×4
BALLOON MUSTANG 5X150X135 (BALLOONS) ×2 IMPLANT
BALLOON STERLING OTW 3X100X150 (BALLOONS) ×1 IMPLANT
BALLOON STERLING RX 3X40X135 (BALLOONS) ×1 IMPLANT
BANDAGE ESMARK 6X9 LF (GAUZE/BANDAGES/DRESSINGS) IMPLANT
BNDG CMPR 9X6 STRL LF SNTH (GAUZE/BANDAGES/DRESSINGS)
BNDG ESMARK 6X9 LF (GAUZE/BANDAGES/DRESSINGS)
CANISTER SUCTION 2500CC (MISCELLANEOUS) ×4 IMPLANT
CATH EMB 3FR 80CM (CATHETERS) IMPLANT
CATH EMB 4FR 80CM (CATHETERS) IMPLANT
CATH EMB 5FR 80CM (CATHETERS) IMPLANT
CATH NAVICROSS ST .035X135CM (MICROCATHETER) ×2 IMPLANT
CATH NAVICROSS ST .035X90CM (MICROCATHETER) ×2 IMPLANT
CATH OMNI FLUSH .035X70CM (CATHETERS) ×2 IMPLANT
CATH QUICKCROSS .018X135CM (MICROCATHETER) ×4 IMPLANT
CATH QUICKCROSS .035X135CM (MICROCATHETER) ×2 IMPLANT
CATH QUICKCROSS SUPP .018X90CM (MICROCATHETER) ×2 IMPLANT
CLIP TI MEDIUM 24 (CLIP) ×4 IMPLANT
CLIP TI WIDE RED SMALL 24 (CLIP) ×4 IMPLANT
COVER BACK TABLE 60X90IN (DRAPES) ×2 IMPLANT
COVER BACK TABLE 80X110 HD (DRAPES) ×2 IMPLANT
COVER DOME SNAP 22 D (MISCELLANEOUS) ×2 IMPLANT
COVER PROBE W GEL 5X96 (DRAPES) ×2 IMPLANT
CUFF TOURNIQUET SINGLE 24IN (TOURNIQUET CUFF) IMPLANT
CUFF TOURNIQUET SINGLE 34IN LL (TOURNIQUET CUFF) IMPLANT
CUFF TOURNIQUET SINGLE 44IN (TOURNIQUET CUFF) IMPLANT
DECANTER SPIKE VIAL GLASS SM (MISCELLANEOUS) IMPLANT
DERMABOND ADVANCED (GAUZE/BANDAGES/DRESSINGS) ×1
DERMABOND ADVANCED .7 DNX12 (GAUZE/BANDAGES/DRESSINGS) ×1 IMPLANT
DEVICE CLOSURE PERCLS PRGLD 6F (VASCULAR PRODUCTS) ×3 IMPLANT
DEVICE TORQUE KENDALL .025-038 (MISCELLANEOUS) ×2 IMPLANT
DRAIN SNY 10X20 3/4 PERF (WOUND CARE) IMPLANT
DRAPE X-RAY CASS 24X20 (DRAPES) IMPLANT
DRSG COVADERM 4X8 (GAUZE/BANDAGES/DRESSINGS) IMPLANT
DRSG TEGADERM 2-3/8X2-3/4 SM (GAUZE/BANDAGES/DRESSINGS) ×2 IMPLANT
DRSG TEGADERM 4X4.75 (GAUZE/BANDAGES/DRESSINGS) ×2 IMPLANT
ELECT REM PT RETURN 9FT ADLT (ELECTROSURGICAL) ×4
ELECTRODE REM PT RTRN 9FT ADLT (ELECTROSURGICAL) ×3 IMPLANT
EVACUATOR SILICONE 100CC (DRAIN) IMPLANT
GAUZE SPONGE 2X2 8PLY STRL LF (GAUZE/BANDAGES/DRESSINGS) ×1 IMPLANT
GLOVE BIO SURGEON STRL SZ7.5 (GLOVE) ×4 IMPLANT
GLOVE BIOGEL PI IND STRL 6.5 (GLOVE) ×2 IMPLANT
GLOVE BIOGEL PI IND STRL 7.0 (GLOVE) ×1 IMPLANT
GLOVE BIOGEL PI INDICATOR 6.5 (GLOVE) ×2
GLOVE BIOGEL PI INDICATOR 7.0 (GLOVE) ×1
GOWN STRL REUS W/ TWL LRG LVL3 (GOWN DISPOSABLE) ×9 IMPLANT
GOWN STRL REUS W/TWL LRG LVL3 (GOWN DISPOSABLE) ×12
GUIDEWIRE ANGLED .035X150CM (WIRE) ×2 IMPLANT
GUIDEWIRE ANGLED .035X260CM (WIRE) ×2 IMPLANT
KIT BASIN OR (CUSTOM PROCEDURE TRAY) ×4 IMPLANT
KIT ENCORE 26 ADVANTAGE (KITS) ×2 IMPLANT
KIT ROOM TURNOVER OR (KITS) ×4 IMPLANT
NDL PERC 18GX7CM (NEEDLE) IMPLANT
NEEDLE PERC 18GX7CM (NEEDLE) ×4 IMPLANT
NS IRRIG 1000ML POUR BTL (IV SOLUTION) ×8 IMPLANT
PACK PERIPHERAL VASCULAR (CUSTOM PROCEDURE TRAY) ×4 IMPLANT
PAD ARMBOARD 7.5X6 YLW CONV (MISCELLANEOUS) ×8 IMPLANT
PADDING CAST COTTON 6X4 STRL (CAST SUPPLIES) IMPLANT
PERCLOSE PROGLIDE 6F (VASCULAR PRODUCTS) ×12
SET COLLECT BLD 21X3/4 12 (NEEDLE) IMPLANT
SET MICROPUNCTURE 5F STIFF (MISCELLANEOUS) ×2 IMPLANT
SHEATH AVANTI 11CM 5FR (MISCELLANEOUS) ×2 IMPLANT
SHEATH FLEX ANSEL ANG 6F 45CM (SHEATH) ×2 IMPLANT
SHEATH PINNACLE 6F 10CM (SHEATH) ×2 IMPLANT
SPONGE GAUZE 2X2 STER 10/PKG (GAUZE/BANDAGES/DRESSINGS) ×1
SPONGE GAUZE 4X4 12PLY STER LF (GAUZE/BANDAGES/DRESSINGS) ×2 IMPLANT
SPONGE SURGIFOAM ABS GEL 100 (HEMOSTASIS) IMPLANT
STAPLER VISISTAT 35W (STAPLE) IMPLANT
STENT VIABAHN 5X100X120 (Permanent Stent) ×2 IMPLANT
STENT VIABAHN 5X150X120 (Permanent Stent) ×4 IMPLANT
STENT VIABAHN 6X150X120 (Permanent Stent) ×2 IMPLANT
STOPCOCK 4 WAY LG BORE MALE ST (IV SETS) IMPLANT
SUT PROLENE 5 0 C 1 24 (SUTURE) ×4 IMPLANT
SUT PROLENE 6 0 CC (SUTURE) ×4 IMPLANT
SUT VIC AB 2-0 CTX 36 (SUTURE) ×4 IMPLANT
SUT VIC AB 3-0 SH 27 (SUTURE) ×4
SUT VIC AB 3-0 SH 27X BRD (SUTURE) ×3 IMPLANT
SYR 20CC LL (SYRINGE) ×2 IMPLANT
SYR 3ML LL SCALE MARK (SYRINGE) ×4 IMPLANT
SYRINGE 10CC LL (SYRINGE) ×2 IMPLANT
TRAY FOLEY W/METER SILVER 16FR (SET/KITS/TRAYS/PACK) ×4 IMPLANT
TUBING EXTENTION W/L.L. (IV SETS) IMPLANT
UNDERPAD 30X30 (UNDERPADS AND DIAPERS) ×4 IMPLANT
WATER STERILE IRR 1000ML POUR (IV SOLUTION) ×2 IMPLANT
WIRE BENTSON .035X145CM (WIRE) ×2 IMPLANT
WIRE G V18X300CM (WIRE) ×4 IMPLANT
WIRE HI TORQ CONN 250T.018X300 (WIRE) ×2 IMPLANT
WIRE ROSEN 145CM (WIRE) ×2 IMPLANT
WIRE ROSEN-J .035X260CM (WIRE) ×2 IMPLANT

## 2016-09-26 NOTE — Op Note (Signed)
Patient name: [name] MRN: [id number] DOB: 11-17-1928 Sex: female   09/26/2016 Pre-operative Diagnosis: acute limb ischemia with left leg rest pain Post-operative diagnosis:  Same Surgeon:  Eda Paschal. Donzetta Matters, MD Procedure Performed: 1.  US guided cannulation of right common femoral artery 2.  Aortogram with Left leg angiogram 3.  Subintimal angioplasty of left sfa  4.  Left sfa and below knee popliteal vein graft stenting with 5x150 viabahn x2, 5 x 100 viabahn and 6 x 150 viabahn stents 5. Balloon angioplasty of left AT with 110mm balloon  Indications:  82 year old female with a history of multiple left lower extremity stents as well as a left SFA to below-knee popliteal artery bypass with vein. She has recently undergone a left common femoral to previous vein bypass grafting with ringed PTFE. 48 hours later she was noted to have a cold foot with rest pain. She was therefore indicated for attempted endovascular salvage.  Findings: There was a very small takeoff of the new bypass graft that was notable. Flow reconstituted in the stenting of her previous above-knee to below-knee bypass graft. There are multiple occluded stents along her native SFA. There was a patent stent in her external iliac artery. Following subintimal angioplasty of her native SFA we placed multiple viabahn stents as an Endo bypass from the previous vein bypass back into the takeoff of the SFA proximally. We also performed a balloon angioplasty of the AT artery. At the conclusion she had direct in-line flow via the ATV with a multiphasic signal at the level of the ankle.   Procedure:  The patient was identified in the holding area and taken to the operating room placed supine on the operating table and general anesthesia was induced. She was given antibiotics sterilely prepped and draped in the left leg in usual fashion timeout called. We began with ultrasound-guided cannulation of the right common femoral artery. This  is performed with 21-gauge needle followed by micro puncture wire and sheath. A Benson wire was placed followed by 5 [place] sheath. Using Omni catheter we performed aortogram and went up and over with Bentson wire and performed left lower extremity angiogram with the findings above. Patient was given heparin and we exchanged for long 6 French sheath into the left femoral system. Using Glidewire and quick cross catheter we selected the native SFA and was subintimal technique were able to cross through multiple stents and get into the above-knee to below-knee bypass graft. Angiogram confirmed we were intraluminal. We then exchanged for 018 wire and used a 3 mm balloon to balloon along the angioplasty. We had very difficult time crossing and ultimately brought a 5 x 1 50 viabahn and guided to the mid SFA and deployed and postdilated with 5 mm balloon. We then used a 5 mm balloon to dilate distally and we were able to get another 5 x 100 viabahn distally past stents in the SFA.  We then turned our attention proximally and deployed a 6 x 1 50 stent up to the level of the takeoff of the SFA. These were all postdilated with a 5 mm balloon. Completion angiogram demonstrated what appeared to be dissection into our bypass. We therefore brought a 5 x 1 50 Biobond distally into the distal aspect of the bypass graft. This was deployed. We postdilated with 5 mm balloon. We then used 3 mm balloon across the anastomosis into the ATV and balloon to low pressure for 2 minutes. Completion angiogram as above demonstrated brisk runoff  through the ATV. She had multiphasic signal there. We then removed our catheters and balloons and exchanged for provide device over Bentson wire. We didn't deploy 3 provide devices and all failed. Without the patient was given 40 mg of protamine sheath were pulled and pressure held until hemostasis was obtained. Patient did tolerate this procedure well without immediate complications.  Contrast: 115  mL.  Blood loss 100 mL.   Sephora Boyar C. Donzetta Matters, MD [facility] [place] Office: [phone] Pager: [phone]

## 2016-09-26 NOTE — Anesthesia Preprocedure Evaluation (Signed)
Anesthesia Evaluation  Patient identified by MRN, date of birth, ID band Patient awake    Reviewed: Allergy & Precautions, H&P , NPO status , Patient's Chart, lab work & pertinent test results, reviewed documented beta blocker date and time   Airway Mallampati: II  TM Distance: >3 FB Neck ROM: Full    Dental no notable dental hx. (+) Edentulous Upper, Upper Dentures, Partial Lower   Pulmonary neg pulmonary ROS, former smoker,    Pulmonary exam normal breath sounds clear to auscultation       Cardiovascular hypertension, Pt. on medications and Pt. on home beta blockers + Peripheral Vascular Disease   Rhythm:Regular Rate:Normal     Neuro/Psych Depression TIAnegative psych ROS   GI/Hepatic Neg liver ROS, GERD  Controlled,  Endo/Other  negative endocrine ROS  Renal/GU negative Renal ROS  negative genitourinary   Musculoskeletal  (+) Arthritis , Osteoarthritis,  Fibromyalgia -  Abdominal   Peds  Hematology negative hematology ROS (+) anemia ,   Anesthesia Other Findings   Reproductive/Obstetrics negative OB ROS                             Anesthesia Physical  Anesthesia Plan  ASA: III and emergent  Anesthesia Plan: General   Post-op Pain Management:    Induction: Intravenous  Airway Management Planned: Oral ETT  Additional Equipment:   Intra-op Plan:   Post-operative Plan: Extubation in OR  Informed Consent: I have reviewed the patients History and Physical, chart, labs and discussed the procedure including the risks, benefits and alternatives for the proposed anesthesia with the patient or authorized representative who has indicated his/her understanding and acceptance.   Dental advisory given  Plan Discussed with: CRNA, Anesthesiologist and Surgeon  Anesthesia Plan Comments:         Anesthesia Quick Evaluation

## 2016-09-26 NOTE — Anesthesia Procedure Notes (Signed)
Central Venous Catheter Insertion Performed by: anesthesiologist 09/26/2016 3:52 PM Patient location: Pre-op. Preanesthetic checklist: patient identified, IV checked, site marked, risks and benefits discussed, surgical consent, monitors and equipment checked, pre-op evaluation, timeout performed and anesthesia consent Lidocaine 1% used for infiltration Landmarks identified Catheter size: 8 Fr Central line was placed.Double lumen Procedure performed using ultrasound guided technique. Attempts: 1 Following insertion, dressing applied and line sutured. Post procedure assessment: blood return through all ports. Patient tolerated the procedure well with no immediate complications.

## 2016-09-26 NOTE — H&P (Signed)
Dr. Donzetta Matters at bedside, able to doppler peroneal pulse to R leg. R foot warm to touch but L foot is warmer to touch.

## 2016-09-26 NOTE — Progress Notes (Addendum)
[facility] Progress Note  Subjective  - POD #2  Having severe pain with left foot starting last night. Numbness is still the same.   Objective Vitals:   09/25/16 1928 09/26/16 0459  BP: (!) 136/45 (!) 156/58  Pulse: 81 76  Resp: 18 18  Temp: 98.2 F (36.8 C) 97.6 F (36.4 C)    Intake/Output Summary (Last 24 hours) at 09/26/16 0732 Last data filed at 09/25/16 1252  Gross per 24 hour  Intake              120 ml  Output                0 ml  Net              120 ml   Left groin and above knee incisions intact. Non dopplerable LEFT AT or PT signals. Signals were strong and brisk yesterday.  Strong doppler flow right AT.  Diminished sensation left foot. Plantar aspect left foot is cyanotic. Moving left toes.   Assessment/Planning: 82 y.o. female is s/p: Redo left femoral to above-knee popliteal artery bypass graft with 6 mm propatent external ring PTFE 2 Days Post-Op   Loss of doppler signals left foot. Having severe pain left foot. Stat graft duplex. NPO. Will have Dr. Donnetta Hutching evaluate.   Alvia Grove 09/26/2016 7:32 AM --  Laboratory CBC    Component Value Date/Time   WBC 8.3 09/25/2016 0519   HGB 8.9 (L) 09/25/2016 0519   HCT 27.4 (L) 09/25/2016 0519   PLT 251 09/25/2016 0519    BMET    Component Value Date/Time   NA 135 09/25/2016 0519   K 4.0 09/25/2016 0519   CL 104 09/25/2016 0519   CO2 22 09/25/2016 0519   GLUCOSE 126 (H) 09/25/2016 0519   BUN 16 09/25/2016 0519   CREATININE 1.01 (H) 09/25/2016 0519   CALCIUM 8.6 (L) 09/25/2016 0519   GFRNONAA 49 (L) 09/25/2016 0519   GFRAA 57 (L) 09/25/2016 0519    COAG Lab Results  Component Value Date   INR 1.01 09/24/2016   INR 1.01 09/02/2013   INR 0.91 09/01/2013   No results found for: PTT  Antibiotics Anti-infectives    Start     Dose/Rate Route Frequency Ordered Stop   09/25/16 2200  cephALEXin (KEFLEX) capsule 500 mg     500 mg Oral 2 times daily 09/24/16 2049     09/24/16 2100  cefUROXime (ZINACEF) 1.5 g in dextrose 5 % 50 mL IVPB     1.5 g 100 mL/hr over 30 Minutes Intravenous Every 12 hours 09/24/16 2049 09/25/16 0848   09/24/16 0606  cefUROXime (ZINACEF) 1.5 g in dextrose 5 % 50 mL IVPB     1.5 g 100 mL/hr over 30 Minutes Intravenous 30 min pre-op 09/24/16 0606 09/24/16 0850       [name], PA-C [facility] Office: [phone] Pager: [phone] 09/26/2016 7:32 AM  I have examined the patient, reviewed and agree with above. No palpable left popliteal pulse. 1-2+ right popliteal pulse with excellent signal in her right foot. No Doppler flow in left foot. Patient reports the rest pain last night but difficult to evaluate by history since she does have chronic numbness and rest pain in her foot. I reviewed her prior films. She does have a patent left above-knee to below-knee vein graft with occlusion of her superficial femoral artery. Underwent left prosthetic popliteal Gore-Tex graft 48 hours ago. Apparently Moneka Mcquinn  occlusion of this. Discussed with the patient. Have made her nothing by mouth. She has not eaten this morning. Explained will in all likelihood require return to the OR for thrombectomy and possible revision.  Curt Jews, MD 09/26/2016 9:14 AM

## 2016-09-26 NOTE — Anesthesia Postprocedure Evaluation (Signed)
Anesthesia Post Note  Patient: [name]  Procedure(s) Performed: Procedure(s) (LRB): LOWER EXTREMITY ANGIOGRAM (Left) AORTOGRAM (N/A) SUB INTIMAL INSERTION OF SUPERFICIAL FEMORAL ARTERY AND BELOW KNEE BYPASS GRAFT (Left)  Patient location during evaluation: PACU Anesthesia Type: General Level of consciousness: awake and alert and patient cooperative Pain management: pain level controlled Vital Signs Assessment: post-procedure vital signs reviewed and stable Respiratory status: spontaneous breathing and respiratory function stable Cardiovascular status: stable Anesthetic complications: no    Last Vitals:  Vitals:   09/26/16 2251 09/26/16 2306  BP: (!) 206/57 (!) 185/65  Pulse: 90   Resp: 20   Temp: 36.8 C     Last Pain:  Vitals:   09/26/16 2251  TempSrc: Axillary  PainSc:                  Stanley S

## 2016-09-26 NOTE — Anesthesia Procedure Notes (Signed)
Procedure Name: Intubation Date/Time: 09/26/2016 3:20 PM Performed by: Trixie Deis A Pre-anesthesia Checklist: Patient identified, Emergency Drugs available, Suction available and Patient being monitored Patient Re-evaluated:Patient Re-evaluated prior to inductionOxygen Delivery Method: Circle System Utilized Preoxygenation: Pre-oxygenation with 100% oxygen Intubation Type: IV induction Ventilation: Mask ventilation without difficulty Laryngoscope Size: Mac and 3 Grade View: Grade I Tube type: Oral Tube size: 7.0 mm Number of attempts: 1 Airway Equipment and Method: Stylet and Oral airway Placement Confirmation: ETT inserted through vocal cords under direct vision,  positive ETCO2 and breath sounds checked- equal and bilateral Secured at: 21 cm Tube secured with: Tape Dental Injury: Teeth and Oropharynx as per pre-operative assessment

## 2016-09-26 NOTE — Transfer of Care (Deleted)
Immediate Anesthesia Transfer of Care Note  Patient: [name]  Procedure(s) Performed: Procedure(s): REVISION OF LEFT FEM POP BYPASS GRAFT (Left) VENOGRAM LOWER EXTREMITY RUN OFF (Left)  Patient Location: PACU  Anesthesia Type:General  Level of Consciousness: sedated  Airway & Oxygen Therapy: Patient Spontanous Breathing and Patient connected to nasal cannula oxygen  Post-op Assessment: Report given to RN, Post -op Vital signs reviewed and stable and Patient moving all extremities  Post vital signs: Reviewed and stable  Last Vitals:  Vitals:   09/26/16 0459 09/26/16 1008  BP: (!) 156/58 (!) 163/62  Pulse: 76 92  Resp: 18   Temp: 36.4 C     Last Pain:  Vitals:   09/26/16 1108  TempSrc:   PainSc: 2       Patients Stated Pain Goal: 3 ([id number])  Complications: No apparent anesthesia complications

## 2016-09-26 NOTE — Progress Notes (Signed)
   Patient complaining of severe right hip pain that has not relented despite dilaudid x1. Right foot now appears pale and cannot reliably doppler signal, previously has AT runoff and does not have palpable popliteal pulse either. Discussed with daughter and will take back to OR for exploration of Right groin with possible femoral endarterectomy and possible right lower extremity angiogram. Consent signed. To OR tonight.   Lundyn Coste C. Donzetta Matters, MD [facility] [place] Office: [phone] Pager: [phone]

## 2016-09-26 NOTE — Consult Note (Signed)
            [facility] CM Primary Care Navigator  09/26/2016  [name] Jul 13, 1928 [id number]    Went in to see patient at the bedside to identify possible discharge needs but nurse states that patient is in surgery for a revision of her left Fem Pop bypass graft. RN states she is not sure if patient will come back to the unit at this time.  Will attempt to follow-up with patient at another time.     For additional questions please contact:  Edwena Felty A. Damonta Cossey, BSN, RN-BC [facility] PRIMARY CARE Navigator Cell: [phone]

## 2016-09-26 NOTE — Progress Notes (Signed)
PT Cancellation Note  Patient Details Name: [name] MRN: [id number] DOB: 1928/08/05   Cancelled Treatment:    Reason Eval/Treat Not Completed: Medical issues which prohibited therapy;Patient at procedure or test/unavailable.  Patient with severe pain with ? occluded graft LLE. Per RN, to OR today.  Will hold PT today and return tomorrow if appropriate for patient.   [name] 09/26/2016, 10:50 AM [name]. [name], [place] Pager [phone]

## 2016-09-26 NOTE — Transfer of Care (Signed)
Immediate Anesthesia Transfer of Care Note  Patient: [name]  Procedure(s) Performed: Procedure(s): LOWER EXTREMITY ANGIOGRAM (Left) AORTOGRAM (N/A) SUB INTIMAL INSERTION OF SUPERFICIAL FEMORAL ARTERY AND BELOW KNEE BYPASS GRAFT (Left)  Patient Location: PACU  Anesthesia Type:General  Level of Consciousness: sedated  Airway & Oxygen Therapy: Patient Spontanous Breathing and Patient connected to nasal cannula oxygen  Post-op Assessment: Report given to RN and Post -op Vital signs reviewed and stable  Post vital signs: Reviewed and stable  Last Vitals:  Vitals:   09/26/16 1008 09/26/16 1934  BP: (!) 163/62   Pulse: 92   Resp:    Temp:  36.3 C    Last Pain:  Vitals:   09/26/16 1108  TempSrc:   PainSc: 2       Patients Stated Pain Goal: 3 ([id number])  Complications: No apparent anesthesia complications

## 2016-09-26 NOTE — Progress Notes (Signed)
   Reviewed images and previous operative note from [name] and discussed with family. Will return to OR for LLE angiogram with possible redo bypass surgery. Risks of non-functioning requiring future amputation, Mi, blood loss discussed with family and they agree to proceed. Posting as urgent case today.  [name] C. Donzetta Matters, MD [facility] [place] Office: [phone] Pager: [phone]

## 2016-09-26 NOTE — Anesthesia Preprocedure Evaluation (Addendum)
Anesthesia Evaluation  Patient identified by MRN, date of birth, ID band Patient awake    Reviewed: Allergy & Precautions, NPO status , Patient's Chart, lab work & pertinent test results  Airway Mallampati: II  TM Distance: >3 FB Neck ROM: Full    Dental  (+) Edentulous Upper, Edentulous Lower   Pulmonary former smoker,           Cardiovascular hypertension,      Neuro/Psych    GI/Hepatic   Endo/Other    Renal/GU      Musculoskeletal   Abdominal   Peds  Hematology   Anesthesia Other Findings   Reproductive/Obstetrics                            Anesthesia Physical Anesthesia Plan  ASA: III and emergent  Anesthesia Plan: General   Post-op Pain Management:    Induction: Intravenous  Airway Management Planned: Oral ETT  Additional Equipment:   Intra-op Plan:   Post-operative Plan: Extubation in OR  Informed Consent: I have reviewed the patients History and Physical, chart, labs and discussed the procedure including the risks, benefits and alternatives for the proposed anesthesia with the patient or authorized representative who has indicated his/her understanding and acceptance.     Plan Discussed with: CRNA and Anesthesiologist  Anesthesia Plan Comments:         Anesthesia Quick Evaluation

## 2016-09-26 NOTE — Progress Notes (Signed)
As first available surgeon in our group offered to do thrombectomy of pt bypass graft.  She would prefer Dr Donnetta Hutching.  Ruta Hinds, MD [facility] [place] Office: [phone] Pager: [phone]

## 2016-09-26 NOTE — Progress Notes (Addendum)
*  PRELIMINARY RESULTS* Vascular Ultrasound Left Lower Extremity Arterial Duplex has been completed.  Study was technically limited due to pain.  Unable to locate the left femoral-popliteal bypass graft, suggestive of occlusion.  The left femoral artery is patent in its proximal segment with severely dampened monophasic flow.   No flow visualized by color or spectral Doppler in the mid and distal femoral artery.   The distal left popliteal artery is patent with severely dampened flow.  The proximal left posterior tibial artery exhibits tardus parvus flow.  Unable to visualize flow by color and spectral Doppler in the left peroneal and left dorsalis pedis arteries.  ABI was not completed due to patient's inability to tolerate any pressure to her left ankle, as well as lack of Dopplerable pedal pulses. Please reorder in the future if/when needed.  09/26/2016 9:39 AM Maudry Mayhew, BS, RVT, RDCS, RDMS

## 2016-09-27 ENCOUNTER — Inpatient Hospital Stay (HOSPITAL_COMMUNITY): Payer: Medicare Other | Admitting: Anesthesiology

## 2016-09-27 DIAGNOSIS — I743 Embolism and thrombosis of arteries of the lower extremities: Secondary | ICD-10-CM

## 2016-09-27 LAB — CBC
HCT: 20.4 % — ABNORMAL LOW (ref 36.0–46.0)
Hemoglobin: 6.4 g/dL — CL (ref 12.0–15.0)
MCH: 29.4 pg (ref 26.0–34.0)
MCHC: 31.4 g/dL (ref 30.0–36.0)
MCV: 93.6 fL (ref 78.0–100.0)
PLATELETS: 217 10*3/uL (ref 150–400)
RBC: 2.18 MIL/uL — ABNORMAL LOW (ref 3.87–5.11)
RDW: 14.7 % (ref 11.5–15.5)
WBC: 11.9 10*3/uL — ABNORMAL HIGH (ref 4.0–10.5)

## 2016-09-27 LAB — BASIC METABOLIC PANEL
Anion gap: 6 (ref 5–15)
BUN: 12 mg/dL (ref 6–20)
CALCIUM: 8.1 mg/dL — AB (ref 8.9–10.3)
CO2: 26 mmol/L (ref 22–32)
CREATININE: 0.97 mg/dL (ref 0.44–1.00)
Chloride: 106 mmol/L (ref 101–111)
GFR calc Af Amer: >60 mL/min (ref >60)
GFR calc non Af Amer: 52 mL/min — ABNORMAL LOW (ref >60)
GLUCOSE: 130 mg/dL — AB (ref 65–99)
Potassium: 4.6 mmol/L (ref 3.5–5.1)
Sodium: 138 mmol/L (ref 135–145)

## 2016-09-27 LAB — HEMOGLOBIN AND HEMATOCRIT, BLOOD
HCT: 26.6 % — ABNORMAL LOW (ref 36.0–46.0)
HEMOGLOBIN: 8.8 g/dL — AB (ref 12.0–15.0)

## 2016-09-27 MED ORDER — PHENYLEPHRINE HCL 10 MG/ML IJ SOLN
INTRAVENOUS | Status: DC | PRN
  Administered 2016-09-27: 50 ug/min via INTRAVENOUS

## 2016-09-27 MED ORDER — FENTANYL CITRATE (PF) 250 MCG/5ML IJ SOLN
INTRAMUSCULAR | Status: DC | PRN
  Administered 2016-09-27: 100 ug via INTRAVENOUS

## 2016-09-27 MED ORDER — ONDANSETRON HCL 4 MG/2ML IJ SOLN
INTRAMUSCULAR | Status: DC | PRN
  Administered 2016-09-27: 4 mg via INTRAVENOUS

## 2016-09-27 MED ORDER — 0.9 % SODIUM CHLORIDE (POUR BTL) OPTIME
TOPICAL | Status: DC | PRN
  Administered 2016-09-27: 1000 mL

## 2016-09-27 MED ORDER — SUCCINYLCHOLINE CHLORIDE 200 MG/10ML IV SOSY
PREFILLED_SYRINGE | INTRAVENOUS | Status: AC
  Filled 2016-09-27: qty 10

## 2016-09-27 MED ORDER — ONDANSETRON HCL 4 MG/2ML IJ SOLN
INTRAMUSCULAR | Status: AC
  Filled 2016-09-27: qty 2

## 2016-09-27 MED ORDER — PROTAMINE SULFATE 10 MG/ML IV SOLN
INTRAVENOUS | Status: DC | PRN
  Administered 2016-09-27: 20 mg via INTRAVENOUS

## 2016-09-27 MED ORDER — WHITE PETROLATUM GEL
Status: AC
  Administered 2016-09-27: 18:00:00
  Filled 2016-09-27: qty 1

## 2016-09-27 MED ORDER — FUROSEMIDE 10 MG/ML IJ SOLN
20.0000 mg | Freq: Once | INTRAMUSCULAR | Status: AC
  Administered 2016-09-27: 20 mg via INTRAVENOUS
  Filled 2016-09-27: qty 2

## 2016-09-27 MED ORDER — HYDROMORPHONE HCL 1 MG/ML IJ SOLN
INTRAMUSCULAR | Status: AC
  Filled 2016-09-27: qty 0.5

## 2016-09-27 MED ORDER — SODIUM CHLORIDE 0.9 % IV SOLN
INTRAVENOUS | Status: DC | PRN
  Administered 2016-09-27: 02:00:00 via INTRAVENOUS

## 2016-09-27 MED ORDER — SUCCINYLCHOLINE CHLORIDE 20 MG/ML IJ SOLN
INTRAMUSCULAR | Status: DC | PRN
  Administered 2016-09-27: 100 mg via INTRAVENOUS

## 2016-09-27 MED ORDER — LACTATED RINGERS IV SOLN
INTRAVENOUS | Status: DC | PRN
  Administered 2016-09-27: via INTRAVENOUS

## 2016-09-27 MED ORDER — CLOPIDOGREL BISULFATE 75 MG PO TABS
75.0000 mg | ORAL_TABLET | Freq: Every day | ORAL | Status: DC
  Administered 2016-09-27 – 2016-10-02 (×6): 75 mg via ORAL
  Filled 2016-09-27 (×6): qty 1

## 2016-09-27 MED ORDER — PROPOFOL 10 MG/ML IV BOLUS
INTRAVENOUS | Status: AC
  Filled 2016-09-27: qty 20

## 2016-09-27 MED ORDER — PROPOFOL 10 MG/ML IV BOLUS
INTRAVENOUS | Status: DC | PRN
  Administered 2016-09-27: 120 mg via INTRAVENOUS

## 2016-09-27 MED ORDER — ONDANSETRON HCL 4 MG/2ML IJ SOLN: 4.0000 mg | Freq: Four times a day (QID) | INTRAMUSCULAR | Status: DC | PRN

## 2016-09-27 MED ORDER — FENTANYL CITRATE (PF) 250 MCG/5ML IJ SOLN
INTRAMUSCULAR | Status: AC
  Filled 2016-09-27: qty 5

## 2016-09-27 MED ORDER — HEMOSTATIC AGENTS (NO CHARGE) OPTIME
TOPICAL | Status: DC | PRN
  Administered 2016-09-27: 1 via TOPICAL

## 2016-09-27 MED ORDER — IODIXANOL 320 MG/ML IV SOLN
INTRAVENOUS | Status: DC | PRN
  Administered 2016-09-27: 20 mL via INTRA_ARTERIAL

## 2016-09-27 MED ORDER — SODIUM CHLORIDE 0.9 % IV SOLN
Freq: Once | INTRAVENOUS | Status: AC
  Administered 2016-09-27: 11:00:00 via INTRAVENOUS

## 2016-09-27 MED ORDER — LIDOCAINE 2% (20 MG/ML) 5 ML SYRINGE
INTRAMUSCULAR | Status: AC
  Filled 2016-09-27: qty 5

## 2016-09-27 MED ORDER — HEPARIN SODIUM (PORCINE) 1000 UNIT/ML IJ SOLN
INTRAMUSCULAR | Status: DC | PRN
  Administered 2016-09-27: 5000 [IU] via INTRAVENOUS

## 2016-09-27 MED ORDER — SODIUM CHLORIDE 0.9 % IV SOLN
INTRAVENOUS | Status: DC | PRN
  Administered 2016-09-27: 500 mL

## 2016-09-27 MED ORDER — HYDROMORPHONE HCL 1 MG/ML IJ SOLN
0.2500 mg | INTRAMUSCULAR | Status: DC | PRN
  Administered 2016-09-27 (×2): 0.5 mg via INTRAVENOUS

## 2016-09-27 MED ORDER — PHENYLEPHRINE HCL 10 MG/ML IJ SOLN
INTRAMUSCULAR | Status: DC | PRN
  Administered 2016-09-27: 80 ug via INTRAVENOUS

## 2016-09-27 NOTE — Anesthesia Procedure Notes (Signed)
Procedure Name: Intubation Date/Time: 09/27/2016 12:36 AM Performed by: Valetta Fuller Pre-anesthesia Checklist: Patient identified, Emergency Drugs available, Suction available and Patient being monitored Patient Re-evaluated:Patient Re-evaluated prior to inductionOxygen Delivery Method: Circle system utilized Preoxygenation: Pre-oxygenation with 100% oxygen Intubation Type: IV induction Ventilation: Mask ventilation without difficulty Laryngoscope Size: Miller and 2 Grade View: Grade I Tube type: Oral Tube size: 7.5 mm Number of attempts: 1 Airway Equipment and Method: Stylet Placement Confirmation: ETT inserted through vocal cords under direct vision,  positive ETCO2 and breath sounds checked- equal and bilateral Secured at: 23 cm Tube secured with: Tape Dental Injury: Teeth and Oropharynx as per pre-operative assessment

## 2016-09-27 NOTE — Progress Notes (Signed)
  Progress Note    09/27/2016 12:12 PM 1 Day Post-Op  Subjective:  Feeling better today  Vitals:   09/27/16 1102 09/27/16 1206  BP: (!) 105/36 (!) 115/52  Pulse: 78   Resp: 14   Temp: 98.4 F (36.9 C) 97.8 F (36.6 C)    Physical Exam: Awake and alert Abdomen is soft Feet are warm, incisions cdi with left medial thigh hematoma  CBC    Component Value Date/Time   WBC 11.9 (H) 09/27/2016 0656   RBC 2.18 (L) 09/27/2016 0656   HGB 6.4 (LL) 09/27/2016 0656   HCT 20.4 (L) 09/27/2016 0656   PLT 217 09/27/2016 0656   MCV 93.6 09/27/2016 0656   MCH 29.4 09/27/2016 0656   MCHC 31.4 09/27/2016 0656   RDW 14.7 09/27/2016 0656   LYMPHSABS 1.8 09/07/2013 0512   MONOABS 0.9 09/07/2013 0512   EOSABS 0.4 09/07/2013 0512   BASOSABS 0.0 09/07/2013 0512    BMET    Component Value Date/Time   NA 138 09/27/2016 0656   K 4.6 09/27/2016 0656   CL 106 09/27/2016 0656   CO2 26 09/27/2016 0656   GLUCOSE 130 (H) 09/27/2016 0656   BUN 12 09/27/2016 0656   CREATININE 0.97 09/27/2016 0656   CALCIUM 8.1 (L) 09/27/2016 0656   GFRNONAA 52 (L) 09/27/2016 0656   GFRAA >60 09/27/2016 0656    INR    Component Value Date/Time   INR 1.01 09/24/2016 0740     Intake/Output Summary (Last 24 hours) at 09/27/16 1212 Last data filed at 09/27/16 1201  Gross per 24 hour  Intake          2965.83 ml  Output             1295 ml  Net          1670.83 ml     Assessment:  82 y.o. female is s/p lle stenting and R CFEA  Plan: Transfuse 2 units with 20 lasix oob as tolerates Advance diet Resume plavix On abx for previous infections with new grafts   Zayyan Mullen C. Donzetta Matters, MD [facility] [place] Office: [phone] Pager: [phone]  09/27/2016 12:12 PM

## 2016-09-27 NOTE — Anesthesia Postprocedure Evaluation (Signed)
Anesthesia Post Note  Patient: LLADIRA BARRINGTON  Procedure(s) Performed: Procedure(s) (LRB): Thromboembolectomy Right Lower Extremity, Right Femoral Artery Endarterectomy with Patch Angioplasty; Right Lower Extremity Angiogram  (Right)  Patient location during evaluation: PACU Anesthesia Type: General Level of consciousness: awake and alert and patient cooperative Pain management: pain level controlled Vital Signs Assessment: post-procedure vital signs reviewed and stable Respiratory status: spontaneous breathing and respiratory function stable Cardiovascular status: stable Anesthetic complications: no    Last Vitals:  Vitals:   09/27/16 0330 09/27/16 0646  BP: (!) 119/44 (!) 142/51  Pulse: 86 85  Resp: 13 18  Temp:  36.4 C    Last Pain:  Vitals:   09/27/16 0646  TempSrc: Oral  PainSc:                  Henrietta S

## 2016-09-27 NOTE — Transfer of Care (Signed)
Immediate Anesthesia Transfer of Care Note  Patient: [name]  Procedure(s) Performed: Procedure(s): Thromboembolectomy Right Lower Extremity, Right Femoral Artery Endarterectomy with Patch Angioplasty; Right Lower Extremity Angiogram  (Right)  Patient Location: PACU  Anesthesia Type:General  Level of Consciousness: awake, sedated and patient cooperative  Airway & Oxygen Therapy: Patient connected to nasal cannula oxygen  Post-op Assessment: Report given to RN and Post -op Vital signs reviewed and stable  Post vital signs: Reviewed and stable  Last Vitals:  Vitals:   09/26/16 2306 09/27/16 0240  BP: (!) 185/65   Pulse:    Resp:    Temp:  36.4 C    Last Pain:  Vitals:   09/26/16 2251  TempSrc: Axillary  PainSc:       Patients Stated Pain Goal: 3 ([id number])  Complications: No apparent anesthesia complications

## 2016-09-27 NOTE — Progress Notes (Signed)
PT Cancellation Note  Patient Details Name: [name] MRN: [id number] DOB: May 12, 1928   Cancelled Treatment:    Reason Eval/Treat Not Completed: Medical issues which prohibited therapy.  Patient with Hgb 6.4.  Will return tomorrow for PT session.   [name] 09/27/2016, 4:56 PM [name]. [name], [place] Pager [phone]

## 2016-09-27 NOTE — Op Note (Signed)
    Patient name: [name] MRN: [id number] DOB: 1928-06-04 Sex: female  09/27/2016 Pre-operative Diagnosis: thrombosed right common femoral artery Post-operative diagnosis:  Same Surgeon:  Erlene Quan C. Donzetta Matters, MD Procedure Performed: 1.  Exploration right groin 2.  Right common femoral endarterectomy with bovine pericardial patch angioplasty 3.  Right lower extremity thromboembolectomy 4.  Right lower extremity angiogram  Indications:  82 year old female who earlier in this day underwent a stenting procedure of her left lower extremity via right common femoral access. She had attempted percutaneous closure at that time that failed. She says completely developed pain in the right groin coolness and lack of signals in the right foot and was indicated for the above procedure.  Findings: There was thrombosis with disrupted plaque in the right common femoral artery. There was significant calcification leading up to the area of the stent in the SFA. There was also broken foot plate from provide device embedded in the wall of the artery. At completion there was runoff to the foot via the ATV in a multiphasic signal at the level of the ankle with a palpable popliteal pulse.   Procedure:  The patient was identified in the holding area and taken to the operating room where she is placed supine on the operating table general anesthesia was induced sterilely prepped and draped in the right lower extremity and timeout called. We began with a vertical incision along the inguinal ligament overlying the common femoral artery. Dissected down to identify the common femoral artery where there was a piece of the provide device existing. Traced this up under the inguinal ligament wife, pulse divided the overlying vein placed vessel loop around the external iliac artery there. Then dissected out the SFA where I could feel stent present as well as the profunda and other branches. Vessel loops were placed around these.  Patient was given heparin. Then opened the artery in a vertical fashion identified significant thrombosis with elevated plaque significant calcification. I removed the plaque and clot in its entirety. I did have strong for bleeding. Performed thrombectomy embolectomy with 3 French Fogarty of the leg until I had return of bleeding from the SFA. I also had good backbleeding from the profunda. This time I irrigated the artery and sewed a bovine pericardial patch in place with 5-0 Prolene suture. At completion released the clamps and way to prevent embolism. This time I had no signal at the level of the foot. I elected to perform angiogram and did this with 22-gauge needle through the patch. Findings as above with runoff directly via the AT to the level of the foot. Several minutes the past and at this time I did have a signal at the level of the ankle which was multiphasic. Satisfied we administered 20 mg of protamine obtained hemostasis of the wound and closed the wound in layers with Vicryl and Monocryl at the level of the skin and Dermabond above that. Patient was allowed awaken from anesthesia and tolerated the procedure quite well.  Blood loss: 300 mL  Contrast: 20 mL     Eulah Walkup C. Donzetta Matters, MD [facility] [place] Office: [phone] Pager: [phone]

## 2016-09-27 NOTE — Progress Notes (Signed)
CRITICAL VALUE ALERT  Critical value received:  Hgb 6.4  Date of notification:  09/27/16  Time of notification:  07:55  Critical value read back:Yes.    Nurse who received alert:  Josie Dixon   MD notified (1st page):  Donzetta Matters  Time of first page:  07:58  Responding MD:  Donzetta Matters  Time MD responded:  08:00

## 2016-09-28 LAB — BASIC METABOLIC PANEL
Anion gap: 5 (ref 5–15)
BUN: 12 mg/dL (ref 6–20)
CHLORIDE: 107 mmol/L (ref 101–111)
CO2: 24 mmol/L (ref 22–32)
CREATININE: 1.01 mg/dL — AB (ref 0.44–1.00)
Calcium: 8 mg/dL — ABNORMAL LOW (ref 8.9–10.3)
GFR calc Af Amer: 57 mL/min — ABNORMAL LOW (ref >60)
GFR calc non Af Amer: 49 mL/min — ABNORMAL LOW (ref >60)
GLUCOSE: 108 mg/dL — AB (ref 65–99)
Potassium: 3.7 mmol/L (ref 3.5–5.1)
SODIUM: 136 mmol/L (ref 135–145)

## 2016-09-28 LAB — CBC
HCT: 25.5 % — ABNORMAL LOW (ref 36.0–46.0)
Hemoglobin: 8.5 g/dL — ABNORMAL LOW (ref 12.0–15.0)
MCH: 28.3 pg (ref 26.0–34.0)
MCHC: 33.3 g/dL (ref 30.0–36.0)
MCV: 85 fL (ref 78.0–100.0)
PLATELETS: 182 10*3/uL (ref 150–400)
RBC: 3 MIL/uL — ABNORMAL LOW (ref 3.87–5.11)
RDW: 21.4 % — AB (ref 11.5–15.5)
WBC: 11.1 10*3/uL — AB (ref 4.0–10.5)

## 2016-09-28 LAB — TYPE AND SCREEN
ABO/RH(D): O POS
ANTIBODY SCREEN: NEGATIVE
UNIT DIVISION: 0
Unit division: 0

## 2016-09-28 MED ORDER — ENSURE ENLIVE PO LIQD
237.0000 mL | Freq: Three times a day (TID) | ORAL | Status: DC
  Administered 2016-09-28 – 2016-10-02 (×5): 237 mL via ORAL

## 2016-09-28 NOTE — Progress Notes (Signed)
Physical Therapy Treatment Patient Details Name: [name] MRN: [id number] DOB: 04-Nov-1928 Today's Date: 09/28/2016    History of Present Illness Patient is an 82 yo female s/p Lt fem-pop bypass graft on 09/24/16.  On 09/26/16, patient with LLE ischemia - to OR for stenting of graft.  Later on 09/26/16, patient with RLE ischemia - to OR for CFEA.   Patient with post-op anemia.      PT Comments    Patient now s/p 2 additional procedures for ischemia to BLE's.  Today patient required max assist to perform stand pivot transfer to chair.  Daughter reports "we've done this before" stating they will take patient home.  Patient will require 24 hour assist.  Also recommend HHPT at d/c for continued therapy.   Daughter requires encouragement to allow patient to do things for herself as much as possible.   Follow Up Recommendations  Home health PT;Supervision/Assistance - 24 hour     Equipment Recommendations  None recommended by PT    Recommendations for Other Services       Precautions / Restrictions Precautions Precautions: Fall Precaution Comments: Surgical incisions BLE's Restrictions Weight Bearing Restrictions: Yes LLE Weight Bearing: Weight bearing as tolerated    Mobility  Bed Mobility Overal bed mobility: Needs Assistance Bed Mobility: Supine to Sit     Supine to sit: Mod assist;HOB elevated     General bed mobility comments: Assist to move LE's to edge of bed, and to raise trunk to sitting position.  Patient sat EOB x 10 minutes (eating part of dinner).  Required min assist for balance.    Transfers Overall transfer level: Needs assistance Equipment used: Rolling walker (2 wheeled) Transfers: Sit to/from [facility] Sit to Stand: Max assist Stand pivot transfers: Mod assist       General transfer comment: Verbal cues for hand placement.  Max assist to power up to standing.  Required 2 attempts before reaching upright position.  Mod assist  to take several shuffle steps to pivot to recliner.  Required assist to maneuver RW.  Assist to control descent into chair.  Ambulation/Gait             General Gait Details: NT   Stairs            Wheelchair Mobility    Modified Rankin (Stroke Patients Only)       Balance Overall balance assessment: Needs assistance Sitting-balance support: Single extremity supported;Feet supported Sitting balance-Leahy Scale: Poor Sitting balance - Comments: UE support and assist to remain upright Postural control: Right lateral lean Standing balance support: Bilateral upper extremity supported Standing balance-Leahy Scale: Poor                      Cognition Arousal/Alertness: Awake/alert Behavior During Therapy: WFL for tasks assessed/performed Overall Cognitive Status: Within Functional Limits for tasks assessed                      Exercises      General Comments        Pertinent Vitals/Pain Pain Assessment: Faces Faces Pain Scale: Hurts little more Pain Location: BLE's with mobility Pain Descriptors / Indicators: Sore;Grimacing Pain Intervention(s): Limited activity within patient's tolerance;Monitored during session;Repositioned;Patient requesting pain meds-RN notified    Home Living                      Prior Function  PT Goals (current goals can now be found in the care plan section) Progress towards PT goals:  (Goals remain appropriate post-op)    Frequency    Min 3X/week      PT Plan Current plan remains appropriate    Co-evaluation             End of Session Equipment Utilized During Treatment: Gait belt Activity Tolerance: Patient limited by pain;Patient limited by fatigue Patient left: in chair;with call bell/phone within reach;with family/visitor present     Time: [id number] PT Time Calculation (min) (ACUTE ONLY): 34 min  Charges:  $Therapeutic Activity: 23-37 mins                    G  Codes:      [name] 10/11/2016, 6:41 PM [name]. [name], [place] Pager [phone]

## 2016-09-28 NOTE — Progress Notes (Signed)
  Progress Note    09/28/2016 11:18 AM 2 Days Post-Op  Subjective: feeling fine, not eating much  Vitals:   09/28/16 0715 09/28/16 1059  BP: (!) 142/57 (!) 112/58  Pulse: 92 81  Resp: 20 17  Temp:  98.7 F (37.1 C)    Physical Exam: Awake and alert Abdomen is soft Feet are warm, incisions cdi with left medial thigh hematoma Signals at bilateral AT  CBC    Component Value Date/Time   WBC 11.1 (H) 09/28/2016 0615   RBC 3.00 (L) 09/28/2016 0615   HGB 8.5 (L) 09/28/2016 0615   HCT 25.5 (L) 09/28/2016 0615   PLT 182 09/28/2016 0615   MCV 85.0 09/28/2016 0615   MCH 28.3 09/28/2016 0615   MCHC 33.3 09/28/2016 0615   RDW 21.4 (H) 09/28/2016 0615   LYMPHSABS 1.8 09/07/2013 0512   MONOABS 0.9 09/07/2013 0512   EOSABS 0.4 09/07/2013 0512   BASOSABS 0.0 09/07/2013 0512    BMET    Component Value Date/Time   NA 136 09/28/2016 0615   K 3.7 09/28/2016 0615   CL 107 09/28/2016 0615   CO2 24 09/28/2016 0615   GLUCOSE 108 (H) 09/28/2016 0615   BUN 12 09/28/2016 0615   CREATININE 1.01 (H) 09/28/2016 0615   CALCIUM 8.0 (L) 09/28/2016 0615   GFRNONAA 49 (L) 09/28/2016 0615   GFRAA 57 (L) 09/28/2016 0615    INR    Component Value Date/Time   INR 1.01 09/24/2016 0740     Intake/Output Summary (Last 24 hours) at 09/28/16 1118 Last data filed at 09/28/16 1015  Gross per 24 hour  Intake          1146.67 ml  Output              850 ml  Net           296.67 ml     Assessment:  82 y.o. female is s/p lle stenting and R CFEA, responded to transfusion yesterday  Plan: oob as tolerates Will add supplements to diet On asa/plavix On abx for previous infections with new grafts D/c foley and central line Transfer to 2W when bed available    Brandon C. Donzetta Matters, MD [facility] [place] Office: [phone] Pager: [phone]  09/28/2016 11:18 AM

## 2016-09-28 NOTE — Progress Notes (Signed)
Patient arrived to 2W31 in no apparent distress.  Telemetry monitor was applied and CCMD notified.  Patient was oriented to unit and room to include call light and phone.  Will continue to monitor.

## 2016-09-29 ENCOUNTER — Encounter (HOSPITAL_COMMUNITY): Payer: Self-pay | Admitting: Vascular Surgery

## 2016-09-29 DIAGNOSIS — I480 Paroxysmal atrial fibrillation: Secondary | ICD-10-CM

## 2016-09-29 LAB — BASIC METABOLIC PANEL
Anion gap: 6 (ref 5–15)
BUN: 16 mg/dL (ref 6–20)
CHLORIDE: 106 mmol/L (ref 101–111)
CO2: 23 mmol/L (ref 22–32)
Calcium: 8.1 mg/dL — ABNORMAL LOW (ref 8.9–10.3)
Creatinine, Ser: 1.07 mg/dL — ABNORMAL HIGH (ref 0.44–1.00)
GFR, EST AFRICAN AMERICAN: 53 mL/min — AB (ref >60)
GFR, EST NON AFRICAN AMERICAN: 46 mL/min — AB (ref >60)
Glucose, Bld: 113 mg/dL — ABNORMAL HIGH (ref 65–99)
POTASSIUM: 3.8 mmol/L (ref 3.5–5.1)
SODIUM: 135 mmol/L (ref 135–145)

## 2016-09-29 LAB — CBC
HEMATOCRIT: 25.7 % — AB (ref 36.0–46.0)
Hemoglobin: 8.4 g/dL — ABNORMAL LOW (ref 12.0–15.0)
MCH: 28 pg (ref 26.0–34.0)
MCHC: 32.7 g/dL (ref 30.0–36.0)
MCV: 85.7 fL (ref 78.0–100.0)
PLATELETS: 184 10*3/uL (ref 150–400)
RBC: 3 MIL/uL — AB (ref 3.87–5.11)
RDW: 20.7 % — AB (ref 11.5–15.5)
WBC: 9.9 10*3/uL (ref 4.0–10.5)

## 2016-09-29 LAB — MAGNESIUM: MAGNESIUM: 2 mg/dL (ref 1.7–2.4)

## 2016-09-29 LAB — TROPONIN I
TROPONIN I: 0.06 ng/mL — AB (ref <0.03)
Troponin I: 0.05 ng/mL (ref <0.03)

## 2016-09-29 MED ORDER — IRBESARTAN 150 MG PO TABS
150.0000 mg | ORAL_TABLET | Freq: Every day | ORAL | Status: DC
  Administered 2016-09-29 – 2016-10-02 (×4): 150 mg via ORAL
  Filled 2016-09-29 (×3): qty 1

## 2016-09-29 MED ORDER — HYDROCHLOROTHIAZIDE 12.5 MG PO CAPS
12.5000 mg | ORAL_CAPSULE | Freq: Every day | ORAL | Status: DC
  Administered 2016-09-30 – 2016-10-02 (×3): 12.5 mg via ORAL
  Filled 2016-09-29 (×3): qty 1

## 2016-09-29 MED ORDER — METOPROLOL TARTRATE 50 MG PO TABS
50.0000 mg | ORAL_TABLET | Freq: Two times a day (BID) | ORAL | Status: DC
  Administered 2016-09-29 – 2016-10-03 (×8): 50 mg via ORAL
  Filled 2016-09-29 (×8): qty 1

## 2016-09-29 MED ORDER — APIXABAN 2.5 MG PO TABS
2.5000 mg | ORAL_TABLET | Freq: Two times a day (BID) | ORAL | Status: DC
  Administered 2016-09-29 – 2016-10-03 (×9): 2.5 mg via ORAL
  Filled 2016-09-29 (×9): qty 1

## 2016-09-29 MED ORDER — METOPROLOL TARTRATE 5 MG/5ML IV SOLN
5.0000 mg | INTRAVENOUS | Status: AC
  Administered 2016-09-29: 5 mg via INTRAVENOUS

## 2016-09-29 MED ORDER — BISACODYL 10 MG RE SUPP
10.0000 mg | Freq: Once | RECTAL | Status: AC
  Administered 2016-09-29: 10 mg via RECTAL
  Filled 2016-09-29: qty 1

## 2016-09-29 NOTE — Progress Notes (Addendum)
[facility] Progress Note  Subjective    Notified by RN that patient is in afib w/ rvr rate 130s. Denies any CP, SOB.   Objective Vitals:   09/28/16 2026 09/29/16 0640  BP: (!) 140/49 (!) 145/65  Pulse: 81 99  Resp: 17 17  Temp: 98.3 F (36.8 C) 98.1 F (36.7 C)    Intake/Output Summary (Last 24 hours) at 09/29/16 0727 Last data filed at 09/29/16 0135  Gross per 24 hour  Intake              240 ml  Output              575 ml  Net             -335 ml   Left groin incision clean. Hematoma left medial thigh incision. Brisk left AT and PT doppler flow. Right groin incision clean. No hematoma. Brisk right AT and peroneal doppler signals.   Assessment/Planning: 82 y.o. female is s/p: redo left femoral to above-knee popliteal bypass with PTFE 09/24/16 which occluded on 09/26/16 with subsequent stenting of left SFA and below knee popliteal vein graft stenting 09/26/16, sudden right hip pain and cold right foot, thrombosed right CFA with subsequent right common femoral endarterectomy and RLE thrombectomy POD 2   New afib with RVR rate 130s. SBP 140. Stable. No CP.  Given 5 mg IV metoprolol x 1. Troponins ordered. Cardiology consulted.  Bilateral lower extremities well perfused.  Suppository today.  Mobilize.  On ASA and Plavix.    Alvia Grove 09/29/2016 7:27 AM --  Laboratory CBC    Component Value Date/Time   WBC 9.9 09/29/2016 0231   HGB 8.4 (L) 09/29/2016 0231   HCT 25.7 (L) 09/29/2016 0231   PLT 184 09/29/2016 0231    BMET    Component Value Date/Time   NA 135 09/29/2016 0231   K 3.8 09/29/2016 0231   CL 106 09/29/2016 0231   CO2 23 09/29/2016 0231   GLUCOSE 113 (H) 09/29/2016 0231   BUN 16 09/29/2016 0231   CREATININE 1.07 (H) 09/29/2016 0231   CALCIUM 8.1 (L) 09/29/2016 0231   GFRNONAA 46 (L) 09/29/2016 0231   GFRAA 53 (L) 09/29/2016 0231    COAG Lab Results  Component Value Date   INR 1.01 09/24/2016   INR 1.01 09/02/2013     INR 0.91 09/01/2013   No results found for: PTT  Antibiotics Anti-infectives    Start     Dose/Rate Route Frequency Ordered Stop   09/26/16 1015  cefUROXime (ZINACEF) 1.5 g in dextrose 5 % 50 mL IVPB     1.5 g 100 mL/hr over 30 Minutes Intravenous To Short Stay 09/26/16 0958 09/26/16 1510   09/25/16 2200  cephALEXin (KEFLEX) capsule 500 mg     500 mg Oral 2 times daily 09/24/16 2049     09/24/16 2100  cefUROXime (ZINACEF) 1.5 g in dextrose 5 % 50 mL IVPB     1.5 g 100 mL/hr over 30 Minutes Intravenous Every 12 hours 09/24/16 2049 09/25/16 0848   09/24/16 0606  cefUROXime (ZINACEF) 1.5 g in dextrose 5 % 50 mL IVPB     1.5 g 100 mL/hr over 30 Minutes Intravenous 30 min pre-op 09/24/16 0606 09/24/16 0850       [name], PA-C [facility] Office: [phone] Pager: [phone] 09/29/2016 7:27 AM   I have independently interviewed patient and agree with PA assessment and plan above. Plavix and eliquis  now for paroxysmal afib. Appreciated cardiology input. Will get PT to eval when she is medically stable.  Declan Adamson C. Donzetta Matters, MD [facility] [place] Office: [phone] Pager: [phone]

## 2016-09-29 NOTE — Progress Notes (Signed)
PT Cancellation Note  Patient Details Name: [name] MRN: [id number] DOB: 1928/07/08   Cancelled Treatment:    Reason Eval/Treat Not Completed: Medical issues which prohibited therapy (pt with new onset afib awaiting cardiology with current rate 110 at rest)   Lanetta Inch Beth 09/29/2016, 9:32 AM  Elwyn Reach, [place]

## 2016-09-29 NOTE — Care Management Important Message (Signed)
Important Message  Patient Details  Name: [name] MRN: [id number] Date of Birth: [birth year]   Medicare Important Message Given:  Yes    Kirra Verga Abena 09/29/2016, 10:28 AM

## 2016-09-29 NOTE — Progress Notes (Signed)
Received call from [place].  Per CCMD, Pt appears to be in Afib.  Will get EKG 12 lead.

## 2016-09-29 NOTE — Progress Notes (Signed)
.  CRITICAL VALUE ALERT  Critical value received:  Troponin 0.05    Date of notification:  09/29/16  Time of notification:  1123  Critical value read back:yes  Nurse who received alert:  Annamaria Boots  PA notified (1st page):  Silva Bandy  Time of first page:  1124  MD notified (2nd page):  Time of second page:  Responding:  Silva Bandy  Time responded:  [id number]

## 2016-09-29 NOTE — Discharge Instructions (Addendum)

## 2016-09-29 NOTE — Consult Note (Signed)
[facility] CM Primary Care Navigator  09/29/2016  [name] [birth year] [id number]   Met with patient, grandson [name]) and his wife at the bedside to identify possible discharge needs.  Patient reports having left leg pain which keeps her up most nights that had led to this admission. Patient endorses Dr. Josetta [name] with [facility] at [facility] as the primary care provider.    Patient shared using [employer] [facility] ) to obtain medications.   She states doing medication management with the assistance of her daughter [name]) using the "pill box" system.   Patient does not drive. Her daughter provides transportation to her doctors' appointments and she is the primary caregiver at home. Two sons [name] and [name]) will be able to assist with care needs as well. Patient lives with spouse but he is not capable of providing care for patient due to his failing health. Daughter usually stays with them at home on the weekends. Plan for discharge is home with home health services.   Patient voiced understanding to call primary care provider's office for a post discharge follow-up appointment within a week or sooner if needs arise.  Patient denies any needs or concerns at this time.    For additional questions please contact:  [name], BSN, RN-BC [facility] PRIMARY CARE Navigator Cell: [phone]

## 2016-09-29 NOTE — Progress Notes (Addendum)
12 lead EKG performed.  Pt in Afib with RVR.  Pt with no history of Afib.  Call placed to [name].  Await further orders.

## 2016-09-29 NOTE — Progress Notes (Addendum)
Pt converted to NSR at 0938. PA, pt, and pt family made aware.   Paged OT and PT that the pt was able to be seen.  Fritz Pickerel, RN

## 2016-09-29 NOTE — Consult Note (Signed)
Patient ID: IVE CEESAY MRN: [id number] DOB/AGE: [birth year] 82 y.o.  Admit date: 09/24/2016 Referring Physician: Trula Slade Primary Physician: Henrine Screws, MD Reason for Consultation: Atrial fib  HPI: 82 yo female with history of PAD, DVT, GERD, HLD, HTN, TIA who we are asked to see today to discuss her atrial fibrillation. She was seen in our cardiology office 09/22/16 by Cecilie Kicks, NP for pre-operative cardiovascular examination. She had undergone a nuclear stress test on 09/19/16 and this showed no evidence of ischemia. She had no chest pain or dyspnea so she was advised to proceed with her planned surgical procedure without further cardiac workup. She underwent left fem pop bypass on 09/24/16. She had left leg pain and was found to have occlusion of the bypass graft. She underwent right femoral artery access on 09/26/16 per [facility] for lower ext angiogram and underwent stenting of the left SFA. She had pain in her right leg and foot after the procedure and was found to have thrombosis of the right common femoral artery requiring thrombectomy and bovine pericardial patch angioplasty. Following the procedures, Hgb fell to 6.4 and she was transfused several units of blood. She was doing well but converted to atrial fibrillation this am. She was given IV metoprolol and converted to sinus.   She has had fluttering in her chest at times at home. No chest pain or dyspnea. She feels ok this am. Still has left leg pain.    Past Medical History:  Diagnosis Date  . Anemia   . Anginal pain ([place])   . Arthritis    "qwhere" (09/05/2016)  . Chronic lower back pain   . Complication of anesthesia    "takes a long time for it to wear off; I can hallucinate if I take too much" (09/05/2016)  . DVT (deep venous thrombosis) ([place]) 10/2009  . Fall from steps 08/31/2013   Fx. pelvis, Left Hip, Left Elbow  . Fibromyalgia   . GERD (gastroesophageal reflux disease)    09/22/16- "no too much anymore"    . High cholesterol   . History of hiatal hernia   . Hypertension   . Macular degeneration, wet ([place])    "started in right eye; now legally blind in that eye; now started in left eye but pretty much in control" (09/05/2016)  . Osteoporosis   . Peripheral vascular disease ([place])   . Squamous cell carcinoma of skin of right calf Aug. 2015  . Stroke ([place])    TIA's no residual  . TIA (transient ischemic attack)    "several at once; none in a long time" (09/05/2016)    Family History  Problem Relation Age of Onset  . Heart disease Father     Heart Disease before age 5  . Hyperlipidemia Father   . Hypertension Father   . Alcohol abuse Father   . Heart disease Brother   . Hyperlipidemia Brother   . Hypertension Brother   . Deep vein thrombosis Brother   . Heart disease Son     Heart Disease before age 24  . Hyperlipidemia Son   . Hypertension Son   . Heart attack Son   . Diabetes Son   . Hypertension Son   . Hyperlipidemia Sister   . Hypertension Sister     Social History   Social History  . Marital status: Married    Spouse name: N/A  . Number of children: N/A  . Years of education: N/A   Occupational History  .  Not on file.   Social History Main Topics  . Smoking status: Former Smoker    Types: Cigarettes    Quit date: 11/17/1946  . Smokeless tobacco: Never Used     Comment: "never smoked much"  . Alcohol use No  . Drug use: No  . Sexual activity: Not on file   Other Topics Concern  . Not on file   Social History Narrative  . No narrative on file    Past Surgical History:  Procedure Laterality Date  . ABDOMINAL AORTAGRAM N/A 12/26/2014   Procedure: ABDOMINAL Maxcine Ham;  Surgeon: Serafina Mitchell, MD;  Location: [facility] CATH LAB;  Service: Cardiovascular;  Laterality: N/A;  . CARPAL TUNNEL RELEASE Right   . CATARACT EXTRACTION W/ INTRAOCULAR LENS  IMPLANT, BILATERAL Bilateral   . COLONOSCOPY    . DILATION AND CURETTAGE OF UTERUS    . EYE SURGERY Right     "macular OR"  . FEMORAL ARTERY STENT  12-11-10   Left SFA  . FEMORAL-POPLITEAL BYPASS GRAFT Left 09/24/2016   Procedure: REDO FEMORAL TO POPLITEAL ARTERY BYPASS GRAFT USING 6MM PROPATEN RINGED GORTEX GRAFT;  Surgeon: Serafina Mitchell, MD;  Location: [place];  Service: Vascular;  Laterality: Left;  . INCISION AND DRAINAGE OF WOUND Left 10/25/2009   leg/notes 11/13/2009  . INSERTION OF ILIAC STENT Left 12/26/2014   Procedure: INSERTION OF ILIAC STENT;  Surgeon: Serafina Mitchell, MD;  Location: [facility] CATH LAB;  Service: Cardiovascular;  Laterality: Left;  . JOINT REPLACEMENT     knee  . JOINT REPLACEMENT Left Oct. 17, 2014   Elbow ( pt fell 08-31-13 )  . ORIF SHOULDER FRACTURE Right    "it was crushed"  . PERIPHERAL VASCULAR CATHETERIZATION N/A 10/30/2015   Procedure: Abdominal Aortogram w/Lower Extremity;  Surgeon: Serafina Mitchell, MD;  Location: [place] CV LAB;  Service: Cardiovascular;  Laterality: N/A;  . PERIPHERAL VASCULAR CATHETERIZATION  10/30/2015   Procedure: Peripheral Vascular Intervention;  Surgeon: Serafina Mitchell, MD;  Location: [place] CV LAB;  Service: Cardiovascular;;  . PERIPHERAL VASCULAR CATHETERIZATION N/A 04/01/2016   Procedure: Abdominal Aortogram w/Lower Extremity;  Surgeon: Serafina Mitchell, MD;  Location: [place] CV LAB;  Service: Cardiovascular;  Laterality: N/A;  . PERIPHERAL VASCULAR CATHETERIZATION Left 04/01/2016   Procedure: Peripheral Vascular Atherectomy;  Surgeon: Serafina Mitchell, MD;  Location: [place] CV LAB;  Service: Cardiovascular;  Laterality: Left;  Superficial femoral artery.  Marland Kitchen PERIPHERAL VASCULAR CATHETERIZATION Right 09/05/2016   "stent"  . PERIPHERAL VASCULAR CATHETERIZATION N/A 09/05/2016   Procedure: Abdominal Aortogram w/Lower Extremity;  Surgeon: Serafina Mitchell, MD;  Location: [place] CV LAB;  Service: Cardiovascular;  Laterality: N/A;  . PERIPHERAL VASCULAR CATHETERIZATION Right 09/05/2016   Procedure: Peripheral Vascular  Intervention;  Surgeon: Serafina Mitchell, MD;  Location: [place] CV LAB;  Service: Cardiovascular;  Laterality: Right;  Superficial Femoral  . PERIPHERAL VASCULAR CATHETERIZATION Left 09/09/2016   Procedure: Lower Extremity Angiography;  Surgeon: Serafina Mitchell, MD;  Location: [place] CV LAB;  Service: Cardiovascular;  Laterality: Left;  . PR VEIN BYPASS GRAFT,AORTO-FEM-POP  09-13-09   Left Fem-pop  . TOTAL ELBOW ARTHROPLASTY Left 09/03/2013   Procedure: LEFT TOTAL ELBOW ARTHROPLASTY;  Surgeon: Roseanne Kaufman, MD;  Location: [place];  Service: Orthopedics;  Laterality: Left;  . TOTAL KNEE ARTHROPLASTY Left 06/2006  . TUBAL LIGATION    . VAGINAL HYSTERECTOMY      Allergies  Allergen Reactions  . Motrin [Ibuprofen] Other (See  Comments)    ADVERSE REACTION - GI BLEED  . Morphine And Related Other (See Comments)    HALLUCINATIONS REACTION IS SIDE EFFECT  . Oxycontin [Oxycodone Hcl] Other (See Comments)    [REACTION IS SIDE EFFECT]  Hallinatations  . Statins Other (See Comments)    ADVERSE REACTION MUSCLE PAIN & WEAKNESS  . Promethazine Hcl Other (See Comments)    IV  Drug only, makes her act crazy  . Sulfa Antibiotics Nausea And Vomiting   Hospital Medications:  No current facility-administered medications on file prior to encounter.    Current Outpatient Prescriptions on File Prior to Encounter  Medication Sig Dispense Refill  . acetaminophen (TYLENOL) 500 MG tablet Take 1,000 mg by mouth 2 (two) times daily as needed for moderate pain or headache. Patient took this medication for her pain.    Marland Kitchen aspirin 81 MG tablet Take 81 mg by mouth daily.    . Cholecalciferol (VITAMIN D3) 2000 UNITS TABS Take 2,000 Units by mouth at bedtime.     . clopidogrel (PLAVIX) 75 MG tablet Take 75 mg by mouth daily.    . Cyanocobalamin (VITAMIN B-12 PO) Take 1 tablet by mouth daily.    Marland Kitchen gabapentin (NEURONTIN) 300 MG capsule Take 300 mg by mouth 2 (two) times daily.     Marland Kitchen HYDROcodone-acetaminophen  (NORCO/VICODIN) 5-325 MG tablet Take 1 tablet by mouth every 6 (six) hours as needed. 15 tablet 0  . loperamide (IMODIUM) 2 MG capsule Take 2 mg by mouth as needed for diarrhea or loose stools.    . metoprolol (LOPRESSOR) 50 MG tablet Take 25-50 mg by mouth 2 (two) times daily. Take 50mg  by mouth in the morning and take 25mg  by mouth at bedtime.    . traMADol (ULTRAM) 50 MG tablet Take 50 mg by mouth at bedtime as needed for moderate pain.     . valsartan-hydrochlorothiazide (DIOVAN-HCT) 160-12.5 MG per tablet Take 0.5 tablets by mouth daily.    . diazepam (VALIUM) 5 MG tablet Take 2.5 mg by mouth at bedtime as needed for anxiety.     . Multiple Vitamins-Minerals (EYE VITAMINS PO) Take 1 tablet by mouth at bedtime.        Review of systems complete and found to be negative unless listed above    Physical Exam: Blood pressure (!) 140/52, pulse (!) 106, temperature 98.1 F (36.7 C), temperature source Oral, resp. rate 17, height 5\' 2"  (1.575 m), weight 134 lb 7.7 oz (61 kg), SpO2 91 %.    General: Well developed, well nourished, NAD  HEENT: OP clear, mucus membranes moist  SKIN: warm, dry. No rashes.  Neuro: No focal deficits  Musculoskeletal: Muscle strength 5/5 all ext  Psychiatric: Mood and affect normal  Neck: No JVD, no carotid bruits, no thyromegaly, no lymphadenopathy.  Lungs:Clear bilaterally, no wheezes, rhonci, crackles  Cardiovascular: Regular rate and rhythm. No murmurs, gallops or rubs.  Abdomen:Soft. Bowel sounds present. Non-tender.  Extremities: 1+ Left lower extremity edema. No right LE edema.    Labs:   Lab Results  Component Value Date   WBC 9.9 09/29/2016   HGB 8.4 (L) 09/29/2016   HCT 25.7 (L) 09/29/2016   MCV 85.7 09/29/2016   PLT 184 09/29/2016    Recent Labs Lab 09/24/16 0740  09/29/16 0231  NA 137  < > 135  K 4.1  < > 3.8  CL 105  < > 106  CO2 23  < > 23  BUN 24*  < > 16  CREATININE 1.18*  < > 1.07*  CALCIUM 9.7  < > 8.1*  PROT 7.0  --   --     BILITOT 0.7  --   --   ALKPHOS 64  --   --   ALT 14  --   --   AST 16  --   --   GLUCOSE 97  < > 113*  < > = values in this interval not displayed.    EKG from this am shows atrial fib with RVR, rate 130 bpm. Non-specific ST abnormalities.   ASSESSMENT AND PLAN:   1. Atrial fibrillation, new onset: She converted to atrial fib overnight and is now back in sinus. She has felt fluttering at home. Likely that she is having paroxysms of atrial fib at home too. Her CHADSVASC score is 7 (Stroke risk was 11.2% per year) I think she would benefit from long term anti-coagulation. -I would consider starting Eliquis when ok from surgical standpoint. Her body weight is right at 60 kg and age over 10. Would use Eliquis 2.5 po BID. Would stop ASA and continue Plavix alone along with Eliquis. - Will increase Lopressor to 50 mg po BID. (home dose is 50 mg am, 25 mg pm).   I have discussed the etiology of atrial fib with the patient and her family. We have discussed treatment strategies in depth. She is agreeable to try anti-coagulation.    SignedLauree Chandler 09/29/2016, 12:30 PM

## 2016-09-29 NOTE — Progress Notes (Signed)
OT Cancellation Note  Patient Details Name: [name] MRN: [id number] DOB: 1928/03/11   Cancelled Treatment:    Reason Eval/Treat Not Completed: Medical issues which prohibited therapy. Pt in Afib, awaiting cardiology per her RN. Will check back later as able/as appropriate  Britt Bottom 09/29/2016, 9:36 AM

## 2016-09-30 DIAGNOSIS — R748 Abnormal levels of other serum enzymes: Secondary | ICD-10-CM

## 2016-09-30 MED ORDER — MAGNESIUM CITRATE PO SOLN: 300.0000 mL | Freq: Once | ORAL | Status: DC

## 2016-09-30 MED ORDER — MAGNESIUM CITRATE PO SOLN
1.0000 | Freq: Once | ORAL | Status: AC
  Administered 2016-09-30: 1 via ORAL
  Filled 2016-09-30: qty 296

## 2016-09-30 NOTE — Progress Notes (Signed)
     SUBJECTIVE: No chest pain or dyspnea.   Tele: sinus  BP (!) 155/55 (BP Location: Right Arm)   Pulse 77   Temp 98.1 F (36.7 C) (Oral)   Resp 18   Ht 5\' 2"  (1.575 m)   Wt 134 lb 7.7 oz (61 kg)   SpO2 96%   BMI 24.60 kg/m   Intake/Output Summary (Last 24 hours) at 09/30/16 0824 Last data filed at 09/30/16 [id number]  Gross per 24 hour  Intake              120 ml  Output              850 ml  Net             -730 ml    PHYSICAL EXAM General: Well developed, well nourished, in no acute distress. Alert and oriented x 3.  Psych:  Good affect, responds appropriately Neck: No JVD. No masses noted.  Lungs: Clear bilaterally with no wheezes or rhonci noted.  Heart: RRR with no murmurs noted. Abdomen: Bowel sounds are present. Soft, non-tender.  Extremities:1+ Left lower extremity edema.   LABS: Basic Metabolic Panel:  Recent Labs  09/28/16 0615 09/29/16 0231 09/29/16 0950  NA 136 135  --   K 3.7 3.8  --   CL 107 106  --   CO2 24 23  --   GLUCOSE 108* 113*  --   BUN 12 16  --   CREATININE 1.01* 1.07*  --   CALCIUM 8.0* 8.1*  --   MG  --   --  2.0   CBC:  Recent Labs  09/28/16 0615 09/29/16 0231  WBC 11.1* 9.9  HGB 8.5* 8.4*  HCT 25.5* 25.7*  MCV 85.0 85.7  PLT 182 184   Cardiac Enzymes:  Recent Labs  09/29/16 0950 09/29/16 1944  TROPONINI 0.05* 0.06*    Current Meds: . apixaban  2.5 mg Oral BID  . aspirin  81 mg Oral Daily  . cephALEXin  500 mg Oral BID  . clopidogrel  75 mg Oral Daily  . docusate sodium  100 mg Oral Daily  . feeding supplement (ENSURE ENLIVE)  237 mL Oral TID WC  . gabapentin  300 mg Oral BID  . irbesartan  150 mg Oral Daily   And  . hydrochlorothiazide  12.5 mg Oral Daily  . magnesium citrate  300 mL Oral Once  . metoprolol  50 mg Oral BID  . pantoprazole  40 mg Oral Daily     ASSESSMENT AND PLAN:   1. Atrial fibrillation, new onset: She is in sinus today. Continue beta blocker. Continue Eliquis. It is likely that she  is having paroxysms of atrial fib at home too. Her CHADSVASC score is 7 (Stroke risk was 11.2% per year).  -Would stop ASA and continue Plavix alone along with Eliquis to reduce bleeding risk.   2. Elevated troponin: Peak 0.06. Likely due to demand ischemia with elevated heart rate yesterday. No ischemic workup is planned.    Lauree Chandler  11/14/20178:24 AM

## 2016-09-30 NOTE — Progress Notes (Signed)
Occupational Therapy Treatment Patient Details Name: [name] MRN: [id number] DOB: 01-Jul-1928 Today's Date: 09/30/2016    History of present illness Patient is an 82 yo female s/p Lt fem-pop bypass graft on 09/24/16.  On 09/26/16, patient with LLE ischemia - to OR for stenting of graft.  Later on 09/26/16, patient with RLE ischemia - to OR for CFEA.   Patient with post-op anemia.     OT comments  Pt limited this date due to frequent BMs with loose stool.  She requires mod A for functional transfers and mod - max A for ADLs.   IF family able to provide min - mod A at discharge, she can discharge home with 24 hour assist, however, anticipate she may need short term SNF level rehab to increase her safety and independence.   Follow Up Recommendations  SNF;Supervision/Assistance - 24 hour    Equipment Recommendations  Tub/shower seat    Recommendations for Other Services      Precautions / Restrictions Precautions Precautions: Fall Precaution Comments: Surgical incisions BLE's Restrictions Weight Bearing Restrictions: Yes LLE Weight Bearing: Weight bearing as tolerated       Mobility Bed Mobility Overal bed mobility: Needs Assistance Bed Mobility: Supine to Sit;Sit to Supine     Supine to sit: Mod assist Sit to supine: Mod assist   General bed mobility comments: assist to move LEs into and out of bed   Transfers Overall transfer level: Needs assistance Equipment used: Rolling walker (2 wheeled) Transfers: Sit to/from [facility] Sit to Stand: Mod assist Stand pivot transfers: Min assist       General transfer comment: verbal cues for hand placement and mod A to assist lifting buttocks from sitting to standing     Balance Overall balance assessment: Needs assistance Sitting-balance support: Feet supported Sitting balance-Leahy Scale: Fair     Standing balance support: Bilateral upper extremity supported Standing balance-Leahy Scale:  Poor Standing balance comment: reliant on bil. UE support and min A                    ADL Overall ADL's : Needs assistance/impaired                         Toilet Transfer: Moderate assistance;Stand-pivot;Ambulation;BSC;RW Toilet Transfer Details (indicate cue type and reason): Pt required mod A to stand pivot to [facility] - was in a hurry due to frequent BMs.  She required mod A to move sit to stand and min a to take three steps back to bed and side step up EOB  Toileting- Clothing Manipulation and Hygiene: Total assistance;Sit to/from stand       Functional mobility during ADLs: Moderate assistance;Rolling walker        Vision                     Perception     Praxis      Cognition   Behavior During Therapy: WFL for tasks assessed/performed Overall Cognitive Status: Within Functional Limits for tasks assessed                       Extremity/Trunk Assessment               Exercises     Shoulder Instructions       General Comments      Pertinent Vitals/ Pain       Pain Assessment: Faces Faces Pain Scale:  Hurts little more Pain Location: buttocks due to frequent BMs Pain Descriptors / Indicators: Burning;Grimacing Pain Intervention(s): Monitored during session  Home Living                                          Prior Functioning/Environment              Frequency  Min 2X/week        Progress Toward Goals  OT Goals(current goals can now be found in the care plan section)  Progress towards OT goals: Not progressing toward goals - comment  ADL Goals Pt Will Transfer to Toilet: with min guard assist;ambulating;bedside commode Pt Will Perform Tub/Shower Transfer: Shower transfer;with caregiver independent in assisting;with min guard assist;shower seat;rolling walker  Plan Discharge plan needs to be updated    Co-evaluation                 End of Session Equipment Utilized During  Treatment: Rolling walker   Activity Tolerance Other (comment) (diarrhea )   Patient Left in bed;with call bell/phone within reach;with family/visitor present   Nurse Communication Mobility status        Time: [id number] OT Time Calculation (min): 33 min  Charges: OT General Charges $OT Visit: 1 Procedure OT Treatments $Self Care/Home Management : 23-37 mins  Phoebe Marter M 09/30/2016, 12:59 PM

## 2016-09-30 NOTE — Progress Notes (Signed)
Insurance check completed for Eliquis S/W TAMARA @ [facility] # [phone]    ELIQUIS 2.5 MG BID  COVER- YES  CO-PAY- $ 35.00  TIER- 3 DRUG  PRIOR APPROVAL- YES # [phone]  PH[facility]ACY : [facility]  [facility]

## 2016-09-30 NOTE — Progress Notes (Signed)
PT Cancellation Note  Patient Details Name: [name] MRN: [id number] DOB: [birth year]   Cancelled Treatment:    Reason Eval/Treat Not Completed: Pain limiting ability to participate; can't have medication for another hour per RN.  Will attempt tomorrow.    [name] 09/30/2016, 4:29 PM [name], [place] 09/30/2016

## 2016-09-30 NOTE — Progress Notes (Addendum)
[facility] Progress Note  Subjective    Abdomen hurts today. No BM with suppository yesterday. No CP. No SOB. Says her feet hurt.   Objective Vitals:   09/29/16 2117 09/30/16 0527  BP: (!) 152/54 (!) 155/55  Pulse: 90 77  Resp: 18 18  Temp: 98.5 F (36.9 C) 98.1 F (36.7 C)    Intake/Output Summary (Last 24 hours) at 09/30/16 0759 Last data filed at 09/30/16 [id number]  Gross per 24 hour  Intake              120 ml  Output              850 ml  Net             -730 ml   Abdomen soft, no distension, mild diffuse tenderness to palpation.  Bilateral groin incisions clean and intact.  Left above knee incision clean.  Good doppler flow AT bilaterally. Feet are warm and pink.    Assessment/Planning: 82 y.o. female is s/p: redo left femoral to above-knee popliteal bypass with PTFE 09/24/16 which occluded on 09/26/16 with subsequent stenting of left SFA and below knee popliteal vein graft stenting 09/26/16, sudden right hip pain and cold right foot, thrombosed right CFA with subsequent right common femoral endarterectomy and RLE thrombectomy POD 4  New onset atrial fibrillation: converted to NSR yesterday. Started on Eliquis yesterday. Appreciate cardiology consult.  Good doppler flow to feet bilaterally.  Will need to work with OT and PT today.  Mag citrate for constipation today.  On Plavix and Eliquis.   Alvia Grove 09/30/2016 7:59 AM --  Laboratory CBC    Component Value Date/Time   WBC 9.9 09/29/2016 0231   HGB 8.4 (L) 09/29/2016 0231   HCT 25.7 (L) 09/29/2016 0231   PLT 184 09/29/2016 0231    BMET    Component Value Date/Time   NA 135 09/29/2016 0231   K 3.8 09/29/2016 0231   CL 106 09/29/2016 0231   CO2 23 09/29/2016 0231   GLUCOSE 113 (H) 09/29/2016 0231   BUN 16 09/29/2016 0231   CREATININE 1.07 (H) 09/29/2016 0231   CALCIUM 8.1 (L) 09/29/2016 0231   GFRNONAA 46 (L) 09/29/2016 0231   GFRAA 53 (L) 09/29/2016 0231    COAG Lab  Results  Component Value Date   INR 1.01 09/24/2016   INR 1.01 09/02/2013   INR 0.91 09/01/2013   No results found for: PTT  Antibiotics Anti-infectives    Start     Dose/Rate Route Frequency Ordered Stop   09/26/16 1015  cefUROXime (ZINACEF) 1.5 g in dextrose 5 % 50 mL IVPB     1.5 g 100 mL/hr over 30 Minutes Intravenous To Short Stay 09/26/16 0958 09/26/16 1510   09/25/16 2200  cephALEXin (KEFLEX) capsule 500 mg     500 mg Oral 2 times daily 09/24/16 2049     09/24/16 2100  cefUROXime (ZINACEF) 1.5 g in dextrose 5 % 50 mL IVPB     1.5 g 100 mL/hr over 30 Minutes Intravenous Every 12 hours 09/24/16 2049 09/25/16 0848   09/24/16 0606  cefUROXime (ZINACEF) 1.5 g in dextrose 5 % 50 mL IVPB     1.5 g 100 mL/hr over 30 Minutes Intravenous 30 min pre-op 09/24/16 0606 09/24/16 0850       [name], PA-C [facility] Office: [phone] Pager: [phone] 09/30/2016 7:59 AM   I agree with the above.  I have seen and  evaluated the patient. Post-op blood loss anemia, stable Foot pain: We'll consider increasing gabapentin. Activity: Blade to get up with physical therapy A. fib: Appreciate assistance from cardiology. Disposition: Family is considering home with home health versus rehabilitation.  I would hold off on rehabilitation consult right now and see how the patient progresses over the next few days  [name]

## 2016-09-30 NOTE — Care Management Note (Signed)
Case Management Note Previous CM note initiated by -Zenon Mayo, RN-09/25/2016, 10:29 AM   Patient Details  Name: [name] MRN: [id number] Date of Birth: 1928-02-05  Subjective/Objective:  S/p left fem pop artery bypass, her daughter lives with her at home and she works, she has pcp Dr. Inda Merlin, she also has a rolling walker and a w/chair at home, she has transportation at dc..  She has medication coverage thru [employer], She has already been pre set up with [facility] for [place], [place], and HHPT.  Tiffany with [facility] is aware.  Patient is for poss dc tomorrow.  NCM will cont to follow for dc needs.                   Action/Plan:   Expected Discharge Date:                  Expected Discharge Plan:  [place]  In-House Referral:     Discharge planning Services  CM Consult, Medication Assistance  Post Acute Care Choice:  Home Health Choice offered to:  Patient  DME Arranged:    DME Agency:     HH Arranged:  RN, PT, OT HH Agency:  [facility]  Status of Service:  Completed, signed off  If discussed at [place] of Stay Meetings, dates discussed:  11/14  Additional Comments:  09/30/16- 1530- [name] RN, CM- CM continues to follow pt for d/c needs- pt has been started on Eliquis- insurance check completed - will need pre-auth - # left for MD to call- copay will be $35/mo- spoke with pt at bedside- Eliquis coverage shared and 30 day free card left with pt and daughter- pt still hopeful for return home with [facility]- was set up with [facility] pre-op. CM to continue to follow for further needs.  Dahlia Client [place], RN 09/30/2016, 4:06 PM [phone]

## 2016-10-01 MED ORDER — GABAPENTIN 400 MG PO CAPS
500.0000 mg | ORAL_CAPSULE | Freq: Two times a day (BID) | ORAL | Status: DC
  Administered 2016-10-01 – 2016-10-02 (×4): 500 mg via ORAL
  Filled 2016-10-01 (×4): qty 1

## 2016-10-01 NOTE — Progress Notes (Addendum)
    Subjective  -  Slept well overnight Feels good except for foot pain (unchanged) Multiple bowel movements yesterday  Physical Exam:  Incisions clean Feet warm abd soft       Assessment/Plan:   Post-op blood loss anemia: stable Pain:  Will increase neurontin to 600 mg BID and try to cut back to 1 pain pill q 6 hrs Needs to improve activity level.  Went from 2 to 1 person assist yesterday, so making progress.  Continue PT.  Will need to consider home with assistance, SNF, vs Rehab sooon Continue Eliquis for AFIB Continue Plavix for recent SFA stenting  Cailean Heacock, [place] 10/01/2016 7:36 AM --  Vitals:   09/30/16 2100 10/01/16 0544  BP: (!) 138/48 (!) 145/60  Pulse: 78 86  Resp: 20 18  Temp: 98.5 F (36.9 C) 97.8 F (36.6 C)    Intake/Output Summary (Last 24 hours) at 10/01/16 0736 Last data filed at 09/30/16 1700  Gross per 24 hour  Intake              120 ml  Output                0 ml  Net              120 ml     Laboratory CBC    Component Value Date/Time   WBC 9.9 09/29/2016 0231   HGB 8.4 (L) 09/29/2016 0231   HCT 25.7 (L) 09/29/2016 0231   PLT 184 09/29/2016 0231    BMET    Component Value Date/Time   NA 135 09/29/2016 0231   K 3.8 09/29/2016 0231   CL 106 09/29/2016 0231   CO2 23 09/29/2016 0231   GLUCOSE 113 (H) 09/29/2016 0231   BUN 16 09/29/2016 0231   CREATININE 1.07 (H) 09/29/2016 0231   CALCIUM 8.1 (L) 09/29/2016 0231   GFRNONAA 46 (L) 09/29/2016 0231   GFRAA 53 (L) 09/29/2016 0231    COAG Lab Results  Component Value Date   INR 1.01 09/24/2016   INR 1.01 09/02/2013   INR 0.91 09/01/2013   No results found for: PTT  Antibiotics Anti-infectives    Start     Dose/Rate Route Frequency Ordered Stop   09/26/16 1015  cefUROXime (ZINACEF) 1.5 g in dextrose 5 % 50 mL IVPB     1.5 g 100 mL/hr over 30 Minutes Intravenous To Short Stay 09/26/16 0958 09/26/16 1510   09/25/16 2200  cephALEXin (KEFLEX) capsule 500 mg     500 mg  Oral 2 times daily 09/24/16 2049     09/24/16 2100  cefUROXime (ZINACEF) 1.5 g in dextrose 5 % 50 mL IVPB     1.5 g 100 mL/hr over 30 Minutes Intravenous Every 12 hours 09/24/16 2049 09/25/16 0848   09/24/16 0606  cefUROXime (ZINACEF) 1.5 g in dextrose 5 % 50 mL IVPB     1.5 g 100 mL/hr over 30 Minutes Intravenous 30 min pre-op 09/24/16 0606 09/24/16 0850       V. Leia Alf, M.D. [facility] [place] Office: [phone] Pager:  [phone]

## 2016-10-01 NOTE — Progress Notes (Signed)
Physical Therapy Treatment Patient Details Name: [name] MRN: [id number] DOB: Jan 30, 1928 Today's Date: 10/01/2016    History of Present Illness Patient is an 82 yo female s/p Lt fem-pop bypass graft on 09/24/16.  On 09/26/16, patient with LLE ischemia - to OR for stenting of graft.  Later on 09/26/16, patient with RLE ischemia - to OR for CFEA.   Patient with post-op anemia.      PT Comments    Pt making excellent progress with functional mobility. Discussed d/c planning and recommendations with patient and daughter. Pt daughter reports she would like to take pt home as long as she can walk. Pt able to gait today with RW x 150' demonstrating improved endurance and functional mobility. Transfers are at min assist level with RW. Recommended for patient to get up and walk with staff 2-3 x day. Daughter expressed that they have 24/7 assist available upon discharge. Pt will continue to benefit from PT services to improve overall functional independence with mobility to prepare for d/c home with family support when medically ready.    Follow Up Recommendations  Home health PT;Supervision/Assistance - 24 hour     Equipment Recommendations   (Daughter thinking about shower chair)    Recommendations for Other Services       Precautions / Restrictions Precautions Precautions: Fall Precaution Comments: Surgical incisions BLE's Restrictions LLE Weight Bearing: Weight bearing as tolerated Other Position/Activity Restrictions: LUE daughter reports she has restrictions for lifting/pushing > 5#    Mobility  Bed Mobility   Bed Mobility: Supine to Sit     Supine to sit: Supervision     General bed mobility comments: Pt used bed rail for support but otherwise completed with cues for technique only  Transfers Overall transfer level: Needs assistance Equipment used: Rolling walker (2 wheeled) Transfers: Sit to/from Stand Sit to Stand: Min assist Stand pivot transfers: Min guard        General transfer comment: verbal cues for hand placement and technique; required min assist for power up  Ambulation/Gait Ambulation/Gait assistance: Min guard Ambulation Distance (Feet): 150 Feet Assistive device: Rolling walker (2 wheeled) Gait Pattern/deviations: Step-through pattern;Decreased stride length     General Gait Details: initially noted to have stiffer gait pattern and required cues for positioning of RW. as progressed, pt with more fluid step length noted and improved positioning of RW. Pt demonstrating increased gait distance today.   Stairs            Wheelchair Mobility    Modified Rankin (Stroke Patients Only)       Balance     Sitting balance-Leahy Scale: Good     Standing balance support: Bilateral upper extremity supported;Single extremity supported Standing balance-Leahy Scale: Poor Standing balance comment: requires use of RW                    Cognition Arousal/Alertness: Awake/alert Behavior During Therapy: WFL for tasks assessed/performed Overall Cognitive Status: Within Functional Limits for tasks assessed                      Exercises      General Comments        Pertinent Vitals/Pain Pain Assessment: Faces Faces Pain Scale: Hurts a little bit Pain Location: legs Pain Descriptors / Indicators: Discomfort Pain Intervention(s): Monitored during session;Other (comment);Premedicated before session (elevated LE's end of session)    Home Living  Prior Function            PT Goals (current goals can now be found in the care plan section) Acute Rehab PT Goals Patient Stated Goal: Go home for Thanksgivign Time For Goal Achievement: 10/09/16 Potential to Achieve Goals: Good Progress towards PT goals: Progressing toward goals    Frequency    Min 3X/week      PT Plan Current plan remains appropriate    Co-evaluation             End of Session Equipment  Utilized During Treatment: Gait belt Activity Tolerance: Patient tolerated treatment well Patient left: in chair;with call bell/phone within reach;with family/visitor present;with nursing/sitter in room     Time: 0938-1000 PT Time Calculation (min) (ACUTE ONLY): 22 min  Charges:  $Therapeutic Activity: 8-22 mins                    G Codes:      Juanna Cao, PT, DPT Pager #: [phone]  10/01/2016, 10:17 AM

## 2016-10-01 NOTE — Progress Notes (Signed)
Pt given 1 norco at 0808 with morning gabapentin. Spoke with PT, she will see the patient in an hour. Pt and her daughter verbalized understanding of needing to ambulate.   Fritz Pickerel, RN

## 2016-10-02 NOTE — Progress Notes (Addendum)
[facility] Progress Note  Subjective   Feet hurt a little less today. Walked three times in hall yesterday.   Objective Vitals:   10/01/16 2152 10/02/16 0523  BP: (!) 150/49 (!) 164/57  Pulse: 81 81  Resp: 18 18  Temp: 97.6 F (36.4 C) 98.2 F (36.8 C)    Intake/Output Summary (Last 24 hours) at 10/02/16 0759 Last data filed at 10/01/16 1630  Gross per 24 hour  Intake              480 ml  Output                0 ml  Net              480 ml   Bilateral groin incisions clean. Left above knee incision clean. Feet warm bilaterally.   Assessment/Planning: 82 y.o. female is s/p: redo left femoral to above-knee popliteal bypass with PTFE 09/24/16 which occluded on 09/26/16 with subsequent stenting of left SFA and below knee popliteal vein graft stenting 09/26/16, sudden right hip pain and cold right foot, thrombosed right CFA with subsequent right common femoral endarterectomy and RLE thrombectomy POD 6  Making good progress with physical therapy. PT recommending home health. Patient wanting to stay another day and discuss home situation with daughter. Patient does not think she can be left alone at home.  Will ask case management about possible [place] aide.  Neurontin increased yesterday.  Continue eliquis for afib and plavix for SFA stenting.   [name] 10/02/2016 7:59 AM --  Laboratory CBC    Component Value Date/Time   WBC 9.9 09/29/2016 0231   HGB 8.4 (L) 09/29/2016 0231   HCT 25.7 (L) 09/29/2016 0231   PLT 184 09/29/2016 0231    BMET    Component Value Date/Time   NA 135 09/29/2016 0231   K 3.8 09/29/2016 0231   CL 106 09/29/2016 0231   CO2 23 09/29/2016 0231   GLUCOSE 113 (H) 09/29/2016 0231   BUN 16 09/29/2016 0231   CREATININE 1.07 (H) 09/29/2016 0231   CALCIUM 8.1 (L) 09/29/2016 0231   GFRNONAA 46 (L) 09/29/2016 0231   GFRAA 53 (L) 09/29/2016 0231    COAG Lab Results  Component Value Date   INR 1.01 09/24/2016   INR 1.01  09/02/2013   INR 0.91 09/01/2013   No results found for: PTT  Antibiotics Anti-infectives    Start     Dose/Rate Route Frequency Ordered Stop   09/26/16 1015  cefUROXime (ZINACEF) 1.5 g in dextrose 5 % 50 mL IVPB     1.5 g 100 mL/hr over 30 Minutes Intravenous To Short Stay 09/26/16 0958 09/26/16 1510   09/25/16 2200  cephALEXin (KEFLEX) capsule 500 mg     500 mg Oral 2 times daily 09/24/16 2049     09/24/16 2100  cefUROXime (ZINACEF) 1.5 g in dextrose 5 % 50 mL IVPB     1.5 g 100 mL/hr over 30 Minutes Intravenous Every 12 hours 09/24/16 2049 09/25/16 0848   09/24/16 0606  cefUROXime (ZINACEF) 1.5 g in dextrose 5 % 50 mL IVPB     1.5 g 100 mL/hr over 30 Minutes Intravenous 30 min pre-op 09/24/16 0606 09/24/16 0850       Karen Jock, PA-C [facility] Office: [phone] Pager: [phone] 10/02/2016 7:59 AM  Agree with the above.  Did well with PT yesterday.  Pain in feet a little better with increase in neurontin.  Hopefully can be discharged soon to home with home PT.  [name]

## 2016-10-02 NOTE — Care Management Important Message (Signed)
Important Message  Patient Details  Name: [name] MRN: [id number] Date of Birth: 1928/10/03   Medicare Important Message Given:  Yes    [name] 10/02/2016, 9:30 AM

## 2016-10-02 NOTE — Progress Notes (Signed)
Nutrition Follow-up  INTERVENTION:   - Continue Provide Ensure Enlive oral nutrition supplement TID. Each provides 350 kcal and 20 grams protein. Pt prefers strawberry flavor. - Encourage PO intake.  NUTRITION DIAGNOSIS:   Inadequate oral intake related to poor appetite as evidenced by per patient/family report.  Improving.  GOAL:   Patient will meet greater than or equal to 90% of their needs  Progressing.  MONITOR:   PO intake, Labs, Weight trends, I & O's, Skin  ASSESSMENT:   82 y.o. female with PMH of HTN, DVT, peripheral vascular disease, GERD, stroke. Pt presents to hospital for surgery. Pt is s/p bypass graft and endartectomy on 11/8.   Pt is s/p endarterectomy, thromboembolectomy, and angiogram on 09/27/16.  Spoke with pt and daughter at bedside. Pt reports appetite has improved and that she is able to eat "a little" more. Per flowsheets, pt consuming 20-100% of meals. Pt states she has been getting additional food from home such as homemade vegetable soup and 1/2 burger with a caramel frappe.  Medications reviewed and include 100 mg Colace daily, 50 mg Lopressor BID, 40 mg Protonix daily, PRN Imodium, PRN magnesium sulfate, PRN potassium chloride, PRN Zofran  Labs reviewed and include elevated creatinine (1.07 mg/dL), low calcium (8.1 mg/dL), low hemoglobin (8.4 g/dL), elevated glucose (113 mg/dL)  Diet Order:  Diet Heart Room service appropriate? Yes; Fluid consistency: Thin  Skin:  Wound (see comment) (closed incisions to left leg)  Last BM:  10/01/16  Height:   Ht Readings from Last 1 Encounters:  09/24/16 5\' 2"  (1.575 m)    Weight:   Wt Readings from Last 1 Encounters:  09/24/16 134 lb 7.7 oz (61 kg)    Ideal Body Weight:  50 kg  BMI:  Body mass index is 24.6 kg/m.  Estimated Nutritional Needs:   Kcal:  1400-1600 (23-26 kcal/kg)  Protein:  80-95 grams (1.3-1.5 g/kg)  Fluid:  1.4-1.6 L/day  EDUCATION NEEDS:   No education needs  identified at this time  Jeb Levering Dietetic Intern Pager Number: [phone]

## 2016-10-02 NOTE — Progress Notes (Signed)
Physical Therapy Treatment Patient Details Name: TEZRA CROFFORD MRN: [id number] DOB: October 10, 1928 Today's Date: 10/02/2016    History of Present Illness Patient is an 82 yo female s/p Lt fem-pop bypass graft on 09/24/16.  On 09/26/16, patient with LLE ischemia - to OR for stenting of graft.  Later on 09/26/16, patient with RLE ischemia - to OR for CFEA.   Patient with post-op anemia.      PT Comments    Pt declined pain meds prior to session, but after gait requesting them.  Pt needed increased A with sit to stand, but once up is at min/guard level with RW.  Pt's goal is to go home to husband who has dementia.  Feel that if family can provide 24 hour A then this is a reasonable d/c plan, but if not, then recommend SNF.  Follow Up Recommendations  Home health PT;Supervision/Assistance - 24 hour     Equipment Recommendations  None recommended by PT    Recommendations for Other Services       Precautions / Restrictions Precautions Precautions: Fall Precaution Comments: Surgical incisions BLE's Restrictions LLE Weight Bearing: Weight bearing as tolerated Other Position/Activity Restrictions: LUE daughter reports she has restrictions for lifting/pushing > 5#    Mobility  Bed Mobility   Bed Mobility: Supine to Sit     Supine to sit: Min assist     General bed mobility comments: Pt used bed rail. Reports she sleeps in recliner  Transfers Overall transfer level: Needs assistance Equipment used: Rolling walker (2 wheeled) Transfers: Sit to/from Stand Sit to Stand: Mod assist         General transfer comment: Cues for scooting forward and for hand placement.  Pt needed MOD A today to power up.  Ambulation/Gait Ambulation/Gait assistance: Min guard Ambulation Distance (Feet): 200 Feet Assistive device: Rolling walker (2 wheeled) Gait Pattern/deviations: Step-through pattern;Narrow base of support Gait velocity: decreased   General Gait Details: cues for posture and  min/guard for general safety   Stairs            Wheelchair Mobility    Modified Rankin (Stroke Patients Only)       Balance     Sitting balance-Leahy Scale: Good     Standing balance support: Bilateral upper extremity supported Standing balance-Leahy Scale: Poor Standing balance comment: requires UE support                    Cognition Arousal/Alertness: Awake/alert Behavior During Therapy: WFL for tasks assessed/performed Overall Cognitive Status: Within Functional Limits for tasks assessed                      Exercises      General Comments        Pertinent Vitals/Pain Pain Assessment: Faces Faces Pain Scale: Hurts little more Pain Location: B thighs Pain Descriptors / Indicators: Aching Pain Intervention(s): Patient requesting pain meds-RN notified;Other (comment) (RN offered pain meds prior to session and pt declined)    Home Living                      Prior Function            PT Goals (current goals can now be found in the care plan section) Acute Rehab PT Goals Patient Stated Goal: Go home for Thanksgivign PT Goal Formulation: With patient Time For Goal Achievement: 10/09/16 Potential to Achieve Goals: Good Progress towards PT goals: Progressing toward goals  Frequency    Min 3X/week      PT Plan Current plan remains appropriate    Co-evaluation             End of Session Equipment Utilized During Treatment: Gait belt Activity Tolerance: Patient limited by fatigue;Patient limited by pain Patient left: in chair;with call bell/phone within reach;with family/visitor present     Time: 1012-1030 PT Time Calculation (min) (ACUTE ONLY): 18 min  Charges:  $Gait Training: 8-22 mins                    G Codes:      Tenae Graziosi LUBECK 10/02/2016, 10:47 AM

## 2016-10-02 NOTE — Care Management Note (Signed)
Case Management Note Previous CM note initiated by -Zenon Mayo, RN-09/25/2016, 10:29 AM   Patient Details  Name: [name] MRN: [id number] Date of Birth: Jun 06, 1928  Subjective/Objective:  S/p left fem pop artery bypass, her daughter lives with her at home and she works, she has pcp Dr. Inda Merlin, she also has a rolling walker and a w/chair at home, she has transportation at dc..  She has medication coverage thru [employer], She has already been pre set up with [facility] for [place], [place], and HHPT.  Tiffany with [facility] is aware.  Patient is for poss dc tomorrow.  NCM will cont to follow for dc needs.                   Action/Plan:   Expected Discharge Date:    10/03/16              Expected Discharge Plan:  [place]  In-House Referral:     Discharge planning Services  CM Consult, Medication Assistance  Post Acute Care Choice:  Home Health Choice offered to:  Patient  DME Arranged:    DME Agency:     HH Arranged:  RN, PT, OT HH Agency:  [facility]  Status of Service:  Completed, signed off  If discussed at [place] of Stay Meetings, dates discussed:  11/14, 11/16  Discharge Disposition: home with home health   Additional Comments:  10/02/16- 1420- [name] RN, CM- plan for d/c tomorrow- referral received for possible aide assist at home- spoke with Tiffany at [facility] pt could have a bath aide a couple days a week for a few weeks this would not be any extensive hours only for a bath assist. Spoke with pt and daughter at bedside- per conversation daughter has FMLA through work - plans on being with pt through Monday- and can take more days if needed - pt's spouse will be at home while daughter at work and daughter is going to arrange for someone to come during some of the time while she is at work so that pt and husband are not alone the entire time. They are familiar with private duty assistance and agencies- at this time they do  not feel like they will need a bath aide- and only want the HHRN/PT/OT services. Confirmed again that they have all needed DME.  Plan is for pt to return home with [facility] and support of daughter.  09/30/16- 1530- [name] RN, CM- CM continues to follow pt for d/c needs- pt has been started on Eliquis- insurance check completed - will need pre-auth - # left for MD to call- copay will be $35/mo- spoke with pt at bedside- Eliquis coverage shared and 30 day free card left with pt and daughter- pt still hopeful for return home with [facility]- was set up with [facility] pre-op. CM to continue to follow for further needs.  Dahlia Client Edgard, RN 10/02/2016, 2:27 PM [phone]

## 2016-10-03 DIAGNOSIS — I480 Paroxysmal atrial fibrillation: Secondary | ICD-10-CM

## 2016-10-03 MED ORDER — GABAPENTIN 300 MG PO CAPS: 600.0000 mg | ORAL_CAPSULE | Freq: Two times a day (BID) | ORAL | 3 refills | Status: DC

## 2016-10-03 MED ORDER — APIXABAN 2.5 MG PO TABS: 2.5000 mg | ORAL_TABLET | Freq: Two times a day (BID) | ORAL | 11 refills | Status: DC

## 2016-10-03 MED ORDER — METOPROLOL TARTRATE 50 MG PO TABS: 50.0000 mg | ORAL_TABLET | Freq: Two times a day (BID) | ORAL | 11 refills | Status: DC

## 2016-10-03 MED ORDER — HYDROCODONE-ACETAMINOPHEN 5-325 MG PO TABS: 1.0000 | ORAL_TABLET | Freq: Four times a day (QID) | ORAL | 0 refills | Status: DC | PRN

## 2016-10-03 NOTE — Progress Notes (Signed)
  [facility] Progress Note  Subjective  Shooting pain   Objective Vitals:   10/02/16 2110 10/03/16 0512  BP: (!) 177/64 (!) 173/61  Pulse: 84 75  Resp: 20 20  Temp: 98 F (36.7 C) 98.7 F (37.1 C)    Intake/Output Summary (Last 24 hours) at 10/03/16 0813 Last data filed at 10/02/16 2130  Gross per 24 hour  Intake              920 ml  Output                0 ml  Net              920 ml   B/l groin incisions clean Left above knee incision clean and intact Feet are warm with brisk doppler flow DP and peroneal bilaterally  Assessment/Planning: 82 y.o. female is s/p: redo left femoral to above-knee popliteal bypass with PTFE 09/24/16 which occluded on 09/26/16 with subsequent stenting of left SFA and below knee popliteal vein graft stenting 09/26/16, sudden right hip pain and cold right foot, thrombosed right CFA with subsequent right common femoral endarterectomy and RLE thrombectomy POD 7  Having neuropathic pain right foot. Neurontin dose recently increased.  Feet are well perfused bilaterally. On Eliquis for PAF. Pre-auth completed. [place] services set up for d/c.  Discharge home today.   [name] 10/03/2016 8:13 AM --  Laboratory CBC    Component Value Date/Time   WBC 9.9 09/29/2016 0231   HGB 8.4 (L) 09/29/2016 0231   HCT 25.7 (L) 09/29/2016 0231   PLT 184 09/29/2016 0231    BMET    Component Value Date/Time   NA 135 09/29/2016 0231   K 3.8 09/29/2016 0231   CL 106 09/29/2016 0231   CO2 23 09/29/2016 0231   GLUCOSE 113 (H) 09/29/2016 0231   BUN 16 09/29/2016 0231   CREATININE 1.07 (H) 09/29/2016 0231   CALCIUM 8.1 (L) 09/29/2016 0231   GFRNONAA 46 (L) 09/29/2016 0231   GFRAA 53 (L) 09/29/2016 0231    COAG Lab Results  Component Value Date   INR 1.01 09/24/2016   INR 1.01 09/02/2013   INR 0.91 09/01/2013   No results found for: PTT  Antibiotics Anti-infectives    Start     Dose/Rate Route Frequency Ordered Stop   09/26/16 1015  cefUROXime (ZINACEF) 1.5 g in dextrose 5 % 50 mL IVPB     1.5 g 100 mL/hr over 30 Minutes Intravenous To Short Stay 09/26/16 0958 09/26/16 1510   09/25/16 2200  cephALEXin (KEFLEX) capsule 500 mg     500 mg Oral 2 times daily 09/24/16 2049     09/24/16 2100  cefUROXime (ZINACEF) 1.5 g in dextrose 5 % 50 mL IVPB     1.5 g 100 mL/hr over 30 Minutes Intravenous Every 12 hours 09/24/16 2049 09/25/16 0848   09/24/16 0606  cefUROXime (ZINACEF) 1.5 g in dextrose 5 % 50 mL IVPB     1.5 g 100 mL/hr over 30 Minutes Intravenous 30 min pre-op 09/24/16 0606 09/24/16 0850       [name], PA-C [facility] Office: [phone] Pager: [phone] 10/03/2016 8:13 AM

## 2016-10-03 NOTE — Progress Notes (Signed)
Occupational Therapy Treatment Patient Details Name: [name] MRN: [id number] DOB: 1927/11/21 Today's Date: 10/03/2016    History of present illness Patient is an 82 yo female s/p Lt fem-pop bypass graft on 09/24/16.  On 09/26/16, patient with LLE ischemia - to OR for stenting of graft.  Later on 09/26/16, patient with RLE ischemia - to OR for CFEA.   Patient with post-op anemia.     OT comments  Pt able to perform UB dressing with min assist and LB dressing with max assist. Daughter present during session and assisting. Plan is for pt to d/c home today with 24/7 supervision from family; updated to [name] for follow up. Will continue to follow acutely.   Follow Up Recommendations  Home health OT;Supervision/Assistance - 24 hour    Equipment Recommendations  Tub/shower seat    Recommendations for Other Services      Precautions / Restrictions Precautions Precautions: Fall Precaution Comments: Surgical incisions BLE's Restrictions Weight Bearing Restrictions: Yes LLE Weight Bearing: Weight bearing as tolerated Other Position/Activity Restrictions: LUE daughter reports she has restrictions for lifting/pushing > 5#       Mobility Bed Mobility               General bed mobility comments: Pt OOB in chair upon arrival.  Transfers Overall transfer level: Needs assistance Equipment used: Rolling walker (2 wheeled) Transfers: Sit to/from Stand Sit to Stand: Mod assist         General transfer comment: Mod assist for sit to stand from chair x1. Cues for hand placement.    Balance Overall balance assessment: Needs assistance Sitting-balance support: No upper extremity supported;Feet unsupported Sitting balance-Leahy Scale: Fair     Standing balance support: Bilateral upper extremity supported Standing balance-Leahy Scale: Poor Standing balance comment: RW for support                   ADL Overall ADL's : Needs assistance/impaired     Grooming: Set  up;Supervision/safety;Sitting;Brushing hair           Upper Body Dressing : Sitting;Minimal assistance;Set up Upper Body Dressing Details (indicate cue type and reason): Assist for bra Lower Body Dressing: Maximal assistance;Sit to/from stand Lower Body Dressing Details (indicate cue type and reason): Assist for pants over feet, socks, and pulling up pants in standing               General ADL Comments: Daughter present and asssing with dressing. Discussed HH therapy for follow up; they are agreeable.      Vision                     Perception     Praxis      Cognition   Behavior During Therapy: WFL for tasks assessed/performed Overall Cognitive Status: Within Functional Limits for tasks assessed                       Extremity/Trunk Assessment               Exercises     Shoulder Instructions       General Comments      Pertinent Vitals/ Pain       Pain Assessment: Faces Faces Pain Scale: Hurts little more Pain Location: Bil feet/legs Pain Descriptors / Indicators: Sore Pain Intervention(s): Monitored during session  Home Living  Prior Functioning/Environment              Frequency  Min 2X/week        Progress Toward Goals  OT Goals(current goals can now be found in the care plan section)  Progress towards OT goals: Progressing toward goals  Acute Rehab OT Goals Patient Stated Goal: home today OT Goal Formulation: With patient/family  Plan Discharge plan needs to be updated    Co-evaluation                 End of Session Equipment Utilized During Treatment: Rolling walker   Activity Tolerance Patient tolerated treatment well   Patient Left in chair;with family/visitor present   Nurse Communication Other (comment) (pt ready for d/c)        Time: 0840-0900 OT Time Calculation (min): 20 min  Charges: OT General Charges $OT Visit: 1  Procedure OT Treatments $Self Care/Home Management : 8-22 mins  Binnie Kand M.S., OTR/L Pager: [phone]  10/03/2016, 9:05 AM

## 2016-10-04 DIAGNOSIS — Z48812 Encounter for surgical aftercare following surgery on the circulatory system: Secondary | ICD-10-CM | POA: Diagnosis not present

## 2016-10-04 DIAGNOSIS — I10 Essential (primary) hypertension: Secondary | ICD-10-CM | POA: Diagnosis not present

## 2016-10-04 DIAGNOSIS — I480 Paroxysmal atrial fibrillation: Secondary | ICD-10-CM | POA: Diagnosis not present

## 2016-10-04 DIAGNOSIS — F329 Major depressive disorder, single episode, unspecified: Secondary | ICD-10-CM | POA: Diagnosis not present

## 2016-10-04 DIAGNOSIS — I739 Peripheral vascular disease, unspecified: Secondary | ICD-10-CM | POA: Diagnosis not present

## 2016-10-04 DIAGNOSIS — M6281 Muscle weakness (generalized): Secondary | ICD-10-CM | POA: Diagnosis not present

## 2016-10-06 ENCOUNTER — Telehealth: Payer: Self-pay

## 2016-10-06 ENCOUNTER — Telehealth: Payer: Self-pay | Admitting: Surgery

## 2016-10-06 NOTE — Discharge Summary (Signed)
[facility] Discharge Summary  [name] July 04, 1928 82 y.o. female  [id number]  Admission Date: 09/24/2016  Discharge Date: 10/03/2016  Physician: [name], MD  Admission Diagnosis: Peripheral vascular disease with bilateral lower extremity rest pain I70.223 COLD LEG  HPI:   This is a 82 y.o. female who initially presented in October 2010 with ischemic changes to her left foot. She underwent distal left superficial femoral to below knee popliteal artery bypass graft with reversed ipsilateral greater saphenous vein she did have issues with wound healing which ultimately resolved. She developed a high-grade stenosis within the popliteal artery above the proximal anastomosis which was stented in 2011. She was also found to have an elevated velocity and on 12/11/2010 and in-stent stenosis was re\re stented using a 6 x 30 EV3 stents.   She recently had an ultrasound that showed native superficial femoral artery stenosis proximal to her stents. Therefore on 01/05/2015 she underwent angiography. At that time, she had stenting of her left external iliac stenosis with a 7 x 30 self-expanding stent as well as stenting of her left superficial femoral artery using a 6 x 30 self-expanding stent. The lesion in the superficial femoral artery was initially treated with a drug coated balloon angioplasty which was complicated by non-flow limiting dissection requiring stenting the bypass graft was found to be widely patent with two-vessel runoff via the peroneal and anterior tibial artery.  She developed velocity elevations again and in December 2016 underwent angiography and stenting as well as in-stent stenosis angioplasty of her left superficial femoral-popliteal artery. She again had velocity elevations recently and on 04/01/2016 I performed atherectomy with a jetstream device and drug coated balloon angioplasty.   She recently presented with bilateral rest pain.   She went back to the cath lab for angiogram on 09/05/2016 and had drug coated balloon angioplasty and stenting of her right superficial femoral artery.  This was done for occlusion.  She was then brought back for evaluation of her left leg.  She was found to have occlusion of her native superficial femoral artery above her femoral-popliteal bypass graft which remained patent.  Hospital Course:  The patient was admitted to the hospital and taken to the operating room on 09/24/2016 and underwent redo left femoral to above-knee popliteal bypass with 6mm PTFE and left common femoral, external iliac and profunda femoral.   The patient was doing well on POD 1 with good doppler flow to the left AT and PT. Her incisions were intact without hematoma. She was transferred out of the stepdown unit to the floor.   On POD 2, she had severe pain in her left foot overnight. She had no doppler flow to the left foot. She did not a have a palpable popliteal pulse. A left lower extremity arterial duplex suggested an occluded left leg bypass. She was taken urgently to the OR  for left lower extremity angiogram with possible redo bypass.   She was taken to the operating room on 09/26/16 and underwent  1.  US guided cannulation of right common femoral artery 2.  Aortogram with Left leg angiogram 3.  Subintimal angioplasty of left sfa  4.  Left sfa and below knee popliteal vein graft stenting with 5x150 viabahn x2, 5 x 100 viabahn and 6 x 150 viabahn stents 5. Balloon angioplasty of left AT with 29mm balloon  Operative findings: There was a very small takeoff of the new bypass graft that was notable. Flow reconstituted in the stenting of her  previous above-knee to below-knee bypass graft. There are multiple occluded stents along her native SFA. There was a patent stent in her external iliac artery. Following subintimal angioplasty of her native SFA we placed multiple viabahn stents as an Endo bypass from the previous vein  bypass back into the takeoff of the SFA proximally. We also performed a balloon angioplasty of the AT artery. At the conclusion she had direct in-line flow via the ATV with a multiphasic signal at the level of the ankle.  The patient tolerated the procedure well and was transported to the PACU in stable condition. Later that evening the patient complained of severe right hip pain that did not improve with dilaudid. Her right foot was pale with no doppler signal. She previously had AT runoff. She did not have a palpable popliteal pulse. She was taken emergently back to the OR and underwent:  1.  Exploration right groin 2.  Right common femoral endarterectomy with bovine pericardial patch angioplasty 3.  Right lower extremity thromboembolectomy 4.  Right lower extremity angiogram  Findings: There was thrombosis with disrupted plaque in the right common femoral artery. There was significant calcification leading up to the area of the stent in the SFA. There was also broken foot plate from provide device embedded in the wall of the artery. At completion there was runoff to the foot via the ATV in a multiphasic signal at the level of the ankle with a palpable popliteal pulse.  Post-operatively, the patient was transfused 2 units of prbcs due to acute surgical blood loss anemia.   Her plavix was resumed. She had bilateral AT doppler signals. Her incisions were clean and intact. She had a stable left medial thigh hematoma.   On POD 3 (09/29/16), the patient had atrial fibrillation with rvr, rate 130s. She denied any chest pain or shortness of breath. Cardiology was consulted. She was given one dose of IV metoprolol 5 mg x 1. She converted to NSR later that day. Her home lopressor dose was increased to 50 mg bid. Her ASA was discontinued and plavix was continued. Given a CHADSVASC score of 7 and she was started on Eliquis 2.5 mg po bid.   The remainder of her hospitalization consisted of increasing  mobilization and pain control. She was discharged home on POD 7 (10/03/16) in good condition with home health services.   CBC    Component Value Date/Time   WBC 9.9 09/29/2016 0231   RBC 3.00 (L) 09/29/2016 0231   HGB 8.4 (L) 09/29/2016 0231   HCT 25.7 (L) 09/29/2016 0231   PLT 184 09/29/2016 0231   MCV 85.7 09/29/2016 0231   MCH 28.0 09/29/2016 0231   MCHC 32.7 09/29/2016 0231   RDW 20.7 (H) 09/29/2016 0231   LYMPHSABS 1.8 09/07/2013 0512   MONOABS 0.9 09/07/2013 0512   EOSABS 0.4 09/07/2013 0512   BASOSABS 0.0 09/07/2013 0512    BMET    Component Value Date/Time   NA 135 09/29/2016 0231   K 3.8 09/29/2016 0231   CL 106 09/29/2016 0231   CO2 23 09/29/2016 0231   GLUCOSE 113 (H) 09/29/2016 0231   BUN 16 09/29/2016 0231   CREATININE 1.07 (H) 09/29/2016 0231   CALCIUM 8.1 (L) 09/29/2016 0231   GFRNONAA 46 (L) 09/29/2016 0231   GFRAA 53 (L) 09/29/2016 0231     Discharge Instructions:   The patient is discharged to home with extensive instructions on wound care and progressive ambulation.  They are instructed not to drive  or perform any heavy lifting until returning to see the physician in his office.  Discharge Instructions    Call MD for:  redness, tenderness, or signs of infection (pain, swelling, bleeding, redness, odor or green/yellow discharge around incision site)    Complete by:  As directed    Call MD for:  severe or increased pain, loss or decreased feeling  in affected limb(s)    Complete by:  As directed    Call MD for:  temperature >100.5    Complete by:  As directed    Discharge wound care:    Complete by:  As directed    Wash the groin wounds with soap and water daily and pat dry. (No tub bath-only shower)  Then put a dry gauze or washcloth there to keep this area dry daily and as needed.  Do not use Vaseline or neosporin on your incisions.  Only use soap and water on your incisions and then protect and keep dry.   Driving Restrictions    Complete by:  As  directed    No driving for 2 weeks   Increase activity slowly    Complete by:  As directed    Walk with assistance use walker or cane as needed   Lifting restrictions    Complete by:  As directed    No lifting greater than a gallon of milk for 2 weeks   Resume previous diet    Complete by:  As directed       Discharge Diagnosis:  Peripheral vascular disease with bilateral lower extremity rest pain I70.223 COLD LEG  Secondary Diagnosis: Patient Active Problem List   Diagnosis Date Noted  . Paroxysmal atrial fibrillation ([place]) 10/03/2016  . 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 PAD (peripheral artery disease) ([place]) 09/05/2016  . Groin pain 04/02/2015  . Aftercare following surgery of the circulatory system, [place] 12/13/2013  . Shingles 10/26/2013  . Depression 10/26/2013  . Acute blood loss anemia 09/05/2013  . Elbow fracture, left 09/05/2013  . Left acetabular fracture ([place]) 09/05/2013  . Ankle fracture, left 09/05/2013  . HTN (hypertension) 09/03/2013  . HLD (hyperlipidemia) 09/03/2013  . Peripheral vascular disease, unspecified 05/10/2012  . Chronic total occlusion of artery of the extremities ([place]) 01/19/2012   Past Medical History:  Diagnosis Date  . Anemia   . Anginal pain ([place])   . Arthritis    "qwhere" (09/05/2016)  . Chronic lower back pain   . Complication of anesthesia    "takes a long time for it to wear off; I can hallucinate if I take too much" (09/05/2016)  . DVT (deep venous thrombosis) ([place]) 10/2009  . Fall from steps 08/31/2013   Fx. pelvis, Left Hip, Left Elbow  . Fibromyalgia   . GERD (gastroesophageal reflux disease)    09/22/16- "no too much anymore"  . High cholesterol   . History of hiatal hernia   . Hypertension   . Macular degeneration, wet ([place])    "started in right eye; now legally blind in that eye; now started in left eye but pretty much in control" (09/05/2016)  .  Osteoporosis   . Peripheral vascular disease ([place])   . Squamous cell carcinoma of skin of right calf Aug. 2015  . Stroke ([place])    TIA's no residual  . TIA (transient ischemic attack)    "several at once; none in a long time" (09/05/2016)  Medication List    STOP taking these medications   aspirin 81 MG tablet   cephALEXin 500 MG capsule Commonly known as:  KEFLEX     TAKE these medications   acetaminophen 500 MG tablet Commonly known as:  TYLENOL Take 1,000 mg by mouth 2 (two) times daily as needed for moderate pain or headache. Patient took this medication for her pain.   apixaban 2.5 MG Tabs tablet Commonly known as:  ELIQUIS Take 1 tablet (2.5 mg total) by mouth 2 (two) times daily.   clopidogrel 75 MG tablet Commonly known as:  PLAVIX Take 75 mg by mouth daily.   diazepam 5 MG tablet Commonly known as:  VALIUM Take 2.5 mg by mouth at bedtime as needed for anxiety.   EYE VITAMINS PO Take 1 tablet by mouth at bedtime.   gabapentin 300 MG capsule Commonly known as:  NEURONTIN Take 2 capsules (600 mg total) by mouth 2 (two) times daily. What changed:  how much to take   HYDROcodone-acetaminophen 5-325 MG tablet Commonly known as:  NORCO/VICODIN Take 1 tablet by mouth every 6 (six) hours as needed.   loperamide 2 MG capsule Commonly known as:  IMODIUM Take 2 mg by mouth as needed for diarrhea or loose stools.   metoprolol 50 MG tablet Commonly known as:  LOPRESSOR Take 1 tablet (50 mg total) by mouth 2 (two) times daily. Take 50mg  by mouth in the morning and take 25mg  by mouth at bedtime. What changed:  how much to take   traMADol 50 MG tablet Commonly known as:  ULTRAM Take 50 mg by mouth at bedtime as needed for moderate pain.   valsartan-hydrochlorothiazide 160-12.5 MG tablet Commonly known as:  DIOVAN-HCT Take 0.5 tablets by mouth daily.   VITAMIN B-12 PO Take 1 tablet by mouth daily.   Vitamin D3 2000 units Tabs Take 2,000 Units by mouth  at bedtime.       Percocet #20 No Refill  Disposition: home  Patient's condition: is Good  Follow up: 1. [name] in 2 weeks   [name], PA-C [facility] [phone] 10/09/2016  2:26 PM  - For VQI Registry use --- Instructions: Press F2 to tab through selections.  Delete question if not applicable.   Post-op:  Wound infection: No  Graft infection: No  Transfusion: Yes  If yes, 2 units given New Arrhythmia: Yes Ipsilateral amputation: Yes, [ ]  Minor, [ ]  BKA, [ ]  AKA Discharge patency: [ ]  Primary, [ ]  Primary assisted, [x ] Secondary, [ ]  Occluded Patency judged by: [x ] Dopper only, [ ]  Palpable graft pulse, [ ]  Palpable distal pulse, [ ]  ABI inc. > 0.15, [ ]  Duplex D/C Ambulatory Status: Ambulatory with Assistance  Complications: MI: No, [ ]  Troponin only, [ ]  EKG or Clinical CHF: No Resp failure:No, [ ]  Pneumonia, [ ]  Ventilator Chg in renal function: No, [ ]  Inc. Cr > 0.5, [ ]  Temp. Dialysis, [ ]  Permanent dialysis Stroke: No, [ ]  Minor, [ ]  Major Return to OR: Yes  Reason for return to OR: [ ]  Bleeding, [ ]  Infection, [x ] Thrombosis, [ ]  Revision  Discharge medications: Statin use:  no ASA use:  no Plavix use:  yes Beta blocker use: yes Coumadin use: no, apixaban

## 2016-10-06 NOTE — Telephone Encounter (Signed)
Received fax from [employer] that [place] has been approved through 11/16/2017, ref# [id number]

## 2016-10-06 NOTE — Telephone Encounter (Signed)
-----   Message from Mena Goes, RN sent at 10/03/2016 12:44 PM EST ----- Regarding: schedule   ----- Message ----- From: Alvia Grove, PA-C Sent: 10/03/2016   8:24 AM To: [facility] Charge Pool  S/p redo left femoral to above-knee popliteal bypass with PTFE 09/24/16 which occluded on 09/26/16 with subsequent stenting of left SFA and below knee popliteal vein graft stenting 09/26/16, sudden right hip pain and cold right foot, thrombosed right CFA with subsequent right common femoral endarterectomy and RLE thrombectomy 09/27/16  Will need f/u 2-3 weeks from now with [name]  Thanks Maudie Mercury

## 2016-10-06 NOTE — Telephone Encounter (Signed)
Pt aware of appt on 12/11 at 3:45 will call if needs to change

## 2016-10-07 DIAGNOSIS — I739 Peripheral vascular disease, unspecified: Secondary | ICD-10-CM | POA: Diagnosis not present

## 2016-10-07 DIAGNOSIS — I10 Essential (primary) hypertension: Secondary | ICD-10-CM | POA: Diagnosis not present

## 2016-10-07 DIAGNOSIS — M6281 Muscle weakness (generalized): Secondary | ICD-10-CM | POA: Diagnosis not present

## 2016-10-07 DIAGNOSIS — I480 Paroxysmal atrial fibrillation: Secondary | ICD-10-CM | POA: Diagnosis not present

## 2016-10-07 DIAGNOSIS — F329 Major depressive disorder, single episode, unspecified: Secondary | ICD-10-CM | POA: Diagnosis not present

## 2016-10-07 DIAGNOSIS — Z48812 Encounter for surgical aftercare following surgery on the circulatory system: Secondary | ICD-10-CM | POA: Diagnosis not present

## 2016-10-08 DIAGNOSIS — I480 Paroxysmal atrial fibrillation: Secondary | ICD-10-CM | POA: Diagnosis not present

## 2016-10-08 DIAGNOSIS — M6281 Muscle weakness (generalized): Secondary | ICD-10-CM | POA: Diagnosis not present

## 2016-10-08 DIAGNOSIS — F329 Major depressive disorder, single episode, unspecified: Secondary | ICD-10-CM | POA: Diagnosis not present

## 2016-10-08 DIAGNOSIS — I10 Essential (primary) hypertension: Secondary | ICD-10-CM | POA: Diagnosis not present

## 2016-10-08 DIAGNOSIS — Z48812 Encounter for surgical aftercare following surgery on the circulatory system: Secondary | ICD-10-CM | POA: Diagnosis not present

## 2016-10-08 DIAGNOSIS — I739 Peripheral vascular disease, unspecified: Secondary | ICD-10-CM | POA: Diagnosis not present

## 2016-10-10 DIAGNOSIS — M6281 Muscle weakness (generalized): Secondary | ICD-10-CM | POA: Diagnosis not present

## 2016-10-10 DIAGNOSIS — I480 Paroxysmal atrial fibrillation: Secondary | ICD-10-CM | POA: Diagnosis not present

## 2016-10-10 DIAGNOSIS — I739 Peripheral vascular disease, unspecified: Secondary | ICD-10-CM | POA: Diagnosis not present

## 2016-10-10 DIAGNOSIS — I10 Essential (primary) hypertension: Secondary | ICD-10-CM | POA: Diagnosis not present

## 2016-10-10 DIAGNOSIS — Z48812 Encounter for surgical aftercare following surgery on the circulatory system: Secondary | ICD-10-CM | POA: Diagnosis not present

## 2016-10-10 DIAGNOSIS — F329 Major depressive disorder, single episode, unspecified: Secondary | ICD-10-CM | POA: Diagnosis not present

## 2016-10-13 ENCOUNTER — Encounter: Payer: Medicare Other | Admitting: Surgery

## 2016-10-13 DIAGNOSIS — I10 Essential (primary) hypertension: Secondary | ICD-10-CM | POA: Diagnosis not present

## 2016-10-13 DIAGNOSIS — Z48812 Encounter for surgical aftercare following surgery on the circulatory system: Secondary | ICD-10-CM | POA: Diagnosis not present

## 2016-10-13 DIAGNOSIS — F329 Major depressive disorder, single episode, unspecified: Secondary | ICD-10-CM | POA: Diagnosis not present

## 2016-10-13 DIAGNOSIS — M6281 Muscle weakness (generalized): Secondary | ICD-10-CM | POA: Diagnosis not present

## 2016-10-13 DIAGNOSIS — I739 Peripheral vascular disease, unspecified: Secondary | ICD-10-CM | POA: Diagnosis not present

## 2016-10-13 DIAGNOSIS — I480 Paroxysmal atrial fibrillation: Secondary | ICD-10-CM | POA: Diagnosis not present

## 2016-10-14 DIAGNOSIS — I480 Paroxysmal atrial fibrillation: Secondary | ICD-10-CM | POA: Diagnosis not present

## 2016-10-14 DIAGNOSIS — M6281 Muscle weakness (generalized): Secondary | ICD-10-CM | POA: Diagnosis not present

## 2016-10-14 DIAGNOSIS — Z48812 Encounter for surgical aftercare following surgery on the circulatory system: Secondary | ICD-10-CM | POA: Diagnosis not present

## 2016-10-14 DIAGNOSIS — I10 Essential (primary) hypertension: Secondary | ICD-10-CM | POA: Diagnosis not present

## 2016-10-14 DIAGNOSIS — I739 Peripheral vascular disease, unspecified: Secondary | ICD-10-CM | POA: Diagnosis not present

## 2016-10-14 DIAGNOSIS — F329 Major depressive disorder, single episode, unspecified: Secondary | ICD-10-CM | POA: Diagnosis not present

## 2016-10-15 DIAGNOSIS — M6281 Muscle weakness (generalized): Secondary | ICD-10-CM | POA: Diagnosis not present

## 2016-10-15 DIAGNOSIS — F329 Major depressive disorder, single episode, unspecified: Secondary | ICD-10-CM | POA: Diagnosis not present

## 2016-10-15 DIAGNOSIS — I10 Essential (primary) hypertension: Secondary | ICD-10-CM | POA: Diagnosis not present

## 2016-10-15 DIAGNOSIS — Z48812 Encounter for surgical aftercare following surgery on the circulatory system: Secondary | ICD-10-CM | POA: Diagnosis not present

## 2016-10-15 DIAGNOSIS — I480 Paroxysmal atrial fibrillation: Secondary | ICD-10-CM | POA: Diagnosis not present

## 2016-10-15 DIAGNOSIS — I739 Peripheral vascular disease, unspecified: Secondary | ICD-10-CM | POA: Diagnosis not present

## 2016-10-16 DIAGNOSIS — I10 Essential (primary) hypertension: Secondary | ICD-10-CM | POA: Diagnosis not present

## 2016-10-16 DIAGNOSIS — I480 Paroxysmal atrial fibrillation: Secondary | ICD-10-CM | POA: Diagnosis not present

## 2016-10-16 DIAGNOSIS — F329 Major depressive disorder, single episode, unspecified: Secondary | ICD-10-CM | POA: Diagnosis not present

## 2016-10-16 DIAGNOSIS — Z48812 Encounter for surgical aftercare following surgery on the circulatory system: Secondary | ICD-10-CM | POA: Diagnosis not present

## 2016-10-16 DIAGNOSIS — I739 Peripheral vascular disease, unspecified: Secondary | ICD-10-CM | POA: Diagnosis not present

## 2016-10-16 DIAGNOSIS — M6281 Muscle weakness (generalized): Secondary | ICD-10-CM | POA: Diagnosis not present

## 2016-10-20 DIAGNOSIS — F329 Major depressive disorder, single episode, unspecified: Secondary | ICD-10-CM | POA: Diagnosis not present

## 2016-10-20 DIAGNOSIS — M6281 Muscle weakness (generalized): Secondary | ICD-10-CM | POA: Diagnosis not present

## 2016-10-20 DIAGNOSIS — I10 Essential (primary) hypertension: Secondary | ICD-10-CM | POA: Diagnosis not present

## 2016-10-20 DIAGNOSIS — Z48812 Encounter for surgical aftercare following surgery on the circulatory system: Secondary | ICD-10-CM | POA: Diagnosis not present

## 2016-10-20 DIAGNOSIS — I480 Paroxysmal atrial fibrillation: Secondary | ICD-10-CM | POA: Diagnosis not present

## 2016-10-20 DIAGNOSIS — I739 Peripheral vascular disease, unspecified: Secondary | ICD-10-CM | POA: Diagnosis not present

## 2016-10-22 ENCOUNTER — Encounter: Payer: Self-pay | Admitting: Surgery

## 2016-10-22 ENCOUNTER — Telehealth: Payer: Self-pay | Admitting: Cardiovascular Disease

## 2016-10-22 ENCOUNTER — Telehealth: Payer: Self-pay

## 2016-10-22 DIAGNOSIS — I739 Peripheral vascular disease, unspecified: Secondary | ICD-10-CM | POA: Diagnosis not present

## 2016-10-22 DIAGNOSIS — K625 Hemorrhage of anus and rectum: Secondary | ICD-10-CM | POA: Diagnosis not present

## 2016-10-22 DIAGNOSIS — I10 Essential (primary) hypertension: Secondary | ICD-10-CM | POA: Diagnosis not present

## 2016-10-22 DIAGNOSIS — R300 Dysuria: Secondary | ICD-10-CM | POA: Diagnosis not present

## 2016-10-22 NOTE — Telephone Encounter (Signed)
Chart reviewed with Dr. Angelena Form and pt should stop Eliquis. Needs office visit this week.  I spoke with pt's daughter who reports pt has frequent diarrhea but this AM noted bright red blood with bowel movement and when wiping after bowel movement.  I gave her instructions to stop Eliquis and scheduled pt to see Melina Copa, PA on 10/23/16 at 11:30.  Daughter reports she was not told at time of discharge to stop ASA so pt was taking until they spoke with surgeon's office earlier today.

## 2016-10-22 NOTE — Telephone Encounter (Signed)
New Message:     Daughter called,she say pt is on Eliquis. Today pt started bleeding in her stool,blood was bright red.Please call to advise.Pt was also on aspirin and she stopped taking that today.

## 2016-10-22 NOTE — Telephone Encounter (Signed)
Discussed pt's symptoms with BRB in stool today with Dr. Donzetta Matters.  Advised that the pt. Should definitely stop the ASA now.  Continue Plavix for the PVD and recent intervention of left LE.  The pt. Should contact Cardiology re: the Eliquis, with new sx of BRB per rectum.  Phone call to daughter, [name].  Advised to contact Cardiology re: recommendation on the Eliquis.  Daughter stated she did not know who to contact for Cardiology, as she was not given a f/u appt. With them.  Noted that Dr. Angelena Form consulted in the [place].  Gave daughter ph. Number to call Dr. Angelena Form @ [facility] @ [phone].  Verb. Understanding.

## 2016-10-22 NOTE — Telephone Encounter (Signed)
rec'd phone call from pt's daughter.  Reported the patient had diarrhea this morning with bright red blood in the toilet bowl.  Reported this was the 1st day of noting blood in stool.  Denied any bleeding of gums.  Reported pt. Is on O2 per [place], and had previously had a little blood from nose, but not recently.  Reported the pt. Is on Eliquis, Plavix and ASA.  Noted in the discharge summary, the pt. Was to stop the ASA upon discharge.  The daughter reported that the pt. was not given instruction to hold the ASA., and has been taking all three blood thinners.

## 2016-10-23 ENCOUNTER — Ambulatory Visit: Payer: Medicare Other | Admitting: Physician Assistant

## 2016-10-23 ENCOUNTER — Inpatient Hospital Stay (HOSPITAL_BASED_OUTPATIENT_CLINIC_OR_DEPARTMENT_OTHER)
Admission: EM | Admit: 2016-10-23 | Discharge: 2016-10-26 | DRG: 378 | Disposition: A | Discharge status: Home / Self Care | Payer: Medicare Other | Attending: Internal Medicine | Admitting: Internal Medicine

## 2016-10-23 ENCOUNTER — Encounter (HOSPITAL_BASED_OUTPATIENT_CLINIC_OR_DEPARTMENT_OTHER): Payer: Self-pay

## 2016-10-23 DIAGNOSIS — Z79899 Other long term (current) drug therapy: Secondary | ICD-10-CM

## 2016-10-23 DIAGNOSIS — Z7982 Long term (current) use of aspirin: Secondary | ICD-10-CM

## 2016-10-23 DIAGNOSIS — K922 Gastrointestinal hemorrhage, unspecified: Principal | ICD-10-CM | POA: Diagnosis present

## 2016-10-23 DIAGNOSIS — Z87891 Personal history of nicotine dependence: Secondary | ICD-10-CM

## 2016-10-23 DIAGNOSIS — H5440 Blindness, one eye, unspecified eye: Secondary | ICD-10-CM | POA: Diagnosis present

## 2016-10-23 DIAGNOSIS — E860 Dehydration: Secondary | ICD-10-CM | POA: Diagnosis present

## 2016-10-23 DIAGNOSIS — Z882 Allergy status to sulfonamides status: Secondary | ICD-10-CM

## 2016-10-23 DIAGNOSIS — Z886 Allergy status to analgesic agent status: Secondary | ICD-10-CM

## 2016-10-23 DIAGNOSIS — M79605 Pain in left leg: Secondary | ICD-10-CM | POA: Diagnosis present

## 2016-10-23 DIAGNOSIS — Z90710 Acquired absence of both cervix and uterus: Secondary | ICD-10-CM

## 2016-10-23 DIAGNOSIS — Z96652 Presence of left artificial knee joint: Secondary | ICD-10-CM | POA: Diagnosis present

## 2016-10-23 DIAGNOSIS — Z888 Allergy status to other drugs, medicaments and biological substances status: Secondary | ICD-10-CM

## 2016-10-23 DIAGNOSIS — Z9842 Cataract extraction status, left eye: Secondary | ICD-10-CM

## 2016-10-23 DIAGNOSIS — Z9841 Cataract extraction status, right eye: Secondary | ICD-10-CM

## 2016-10-23 DIAGNOSIS — Z961 Presence of intraocular lens: Secondary | ICD-10-CM | POA: Diagnosis present

## 2016-10-23 DIAGNOSIS — M810 Age-related osteoporosis without current pathological fracture: Secondary | ICD-10-CM | POA: Diagnosis present

## 2016-10-23 DIAGNOSIS — I480 Paroxysmal atrial fibrillation: Secondary | ICD-10-CM | POA: Diagnosis present

## 2016-10-23 DIAGNOSIS — I7092 Chronic total occlusion of artery of the extremities: Secondary | ICD-10-CM | POA: Diagnosis not present

## 2016-10-23 DIAGNOSIS — M79604 Pain in right leg: Secondary | ICD-10-CM | POA: Diagnosis present

## 2016-10-23 DIAGNOSIS — M797 Fibromyalgia: Secondary | ICD-10-CM | POA: Diagnosis present

## 2016-10-23 DIAGNOSIS — Z7901 Long term (current) use of anticoagulants: Secondary | ICD-10-CM

## 2016-10-23 DIAGNOSIS — G8929 Other chronic pain: Secondary | ICD-10-CM | POA: Diagnosis not present

## 2016-10-23 DIAGNOSIS — Z8249 Family history of ischemic heart disease and other diseases of the circulatory system: Secondary | ICD-10-CM

## 2016-10-23 DIAGNOSIS — Z885 Allergy status to narcotic agent status: Secondary | ICD-10-CM

## 2016-10-23 DIAGNOSIS — Z66 Do not resuscitate: Secondary | ICD-10-CM | POA: Diagnosis present

## 2016-10-23 DIAGNOSIS — Z7902 Long term (current) use of antithrombotics/antiplatelets: Secondary | ICD-10-CM

## 2016-10-23 DIAGNOSIS — Z85828 Personal history of other malignant neoplasm of skin: Secondary | ICD-10-CM

## 2016-10-23 DIAGNOSIS — I739 Peripheral vascular disease, unspecified: Secondary | ICD-10-CM | POA: Diagnosis present

## 2016-10-23 DIAGNOSIS — Z8673 Personal history of transient ischemic attack (TIA), and cerebral infarction without residual deficits: Secondary | ICD-10-CM

## 2016-10-23 DIAGNOSIS — F419 Anxiety disorder, unspecified: Secondary | ICD-10-CM | POA: Diagnosis not present

## 2016-10-23 DIAGNOSIS — K219 Gastro-esophageal reflux disease without esophagitis: Secondary | ICD-10-CM | POA: Diagnosis present

## 2016-10-23 DIAGNOSIS — K625 Hemorrhage of anus and rectum: Secondary | ICD-10-CM | POA: Diagnosis not present

## 2016-10-23 DIAGNOSIS — D62 Acute posthemorrhagic anemia: Secondary | ICD-10-CM | POA: Diagnosis not present

## 2016-10-23 DIAGNOSIS — M545 Low back pain: Secondary | ICD-10-CM | POA: Diagnosis present

## 2016-10-23 DIAGNOSIS — K580 Irritable bowel syndrome with diarrhea: Secondary | ICD-10-CM | POA: Diagnosis present

## 2016-10-23 DIAGNOSIS — H353290 Exudative age-related macular degeneration, unspecified eye, stage unspecified: Secondary | ICD-10-CM | POA: Diagnosis present

## 2016-10-23 DIAGNOSIS — I10 Essential (primary) hypertension: Secondary | ICD-10-CM

## 2016-10-23 DIAGNOSIS — E7800 Pure hypercholesterolemia, unspecified: Secondary | ICD-10-CM | POA: Diagnosis present

## 2016-10-23 DIAGNOSIS — Z833 Family history of diabetes mellitus: Secondary | ICD-10-CM

## 2016-10-23 HISTORY — DX: Gastrointestinal hemorrhage, unspecified: K92.2

## 2016-10-23 LAB — CBC WITH DIFFERENTIAL/PLATELET
BAND NEUTROPHILS: 7 %
BASOS ABS: 0.1 10*3/uL (ref 0.0–0.1)
BASOS PCT: 1 %
Blasts: 0 %
EOS ABS: 0.5 10*3/uL (ref 0.0–0.7)
EOS PCT: 6 %
HCT: 30.9 % — ABNORMAL LOW (ref 36.0–46.0)
HEMOGLOBIN: 9.7 g/dL — AB (ref 12.0–15.0)
Lymphocytes Relative: 23 %
Lymphs Abs: 1.9 10*3/uL (ref 0.7–4.0)
MCH: 29.4 pg (ref 26.0–34.0)
MCHC: 31.4 g/dL (ref 30.0–36.0)
MCV: 93.6 fL (ref 78.0–100.0)
MONO ABS: 0.6 10*3/uL (ref 0.1–1.0)
MYELOCYTES: 3 %
Metamyelocytes Relative: 1 %
Monocytes Relative: 7 %
NEUTROS ABS: 5.3 10*3/uL (ref 1.7–7.7)
Neutrophils Relative %: 52 %
PLATELETS: 222 10*3/uL (ref 150–400)
PROMYELOCYTES ABS: 0 %
RBC: 3.3 MIL/uL — ABNORMAL LOW (ref 3.87–5.11)
RDW: 18.9 % — ABNORMAL HIGH (ref 11.5–15.5)
WBC: 8.4 10*3/uL (ref 4.0–10.5)
nRBC: 0 /100 WBC

## 2016-10-23 LAB — HEPATIC FUNCTION PANEL
ALBUMIN: 2.9 g/dL — AB (ref 3.5–5.0)
ALK PHOS: 56 U/L (ref 38–126)
ALT: 10 U/L — AB (ref 14–54)
AST: 20 U/L (ref 15–41)
BILIRUBIN TOTAL: 0.3 mg/dL (ref 0.3–1.2)
Bilirubin, Direct: <0.1 mg/dL — ABNORMAL LOW (ref 0.1–0.5)
Total Protein: 5.4 g/dL — ABNORMAL LOW (ref 6.5–8.1)

## 2016-10-23 LAB — BASIC METABOLIC PANEL
Anion gap: 9 (ref 5–15)
BUN: 38 mg/dL — AB (ref 6–20)
CHLORIDE: 103 mmol/L (ref 101–111)
CO2: 23 mmol/L (ref 22–32)
CREATININE: 1.09 mg/dL — AB (ref 0.44–1.00)
Calcium: 9.4 mg/dL (ref 8.9–10.3)
GFR, EST AFRICAN AMERICAN: 52 mL/min — AB (ref >60)
GFR, EST NON AFRICAN AMERICAN: 45 mL/min — AB (ref >60)
Glucose, Bld: 110 mg/dL — ABNORMAL HIGH (ref 65–99)
Potassium: 4.1 mmol/L (ref 3.5–5.1)
SODIUM: 135 mmol/L (ref 135–145)

## 2016-10-23 LAB — OCCULT BLOOD X 1 CARD TO LAB, STOOL: FECAL OCCULT BLD: POSITIVE — AB

## 2016-10-23 LAB — PROTIME-INR
INR: 1
PROTHROMBIN TIME: 13.2 s (ref 11.4–15.2)

## 2016-10-23 LAB — APTT: aPTT: 29 seconds (ref 24–36)

## 2016-10-23 MED ORDER — OCUVITE-LUTEIN PO CAPS
ORAL_CAPSULE | Freq: Every day | ORAL | Status: DC
  Filled 2016-10-23: qty 1

## 2016-10-23 MED ORDER — PANTOPRAZOLE SODIUM 40 MG IV SOLR
INTRAVENOUS | Status: AC
  Filled 2016-10-23: qty 160

## 2016-10-23 MED ORDER — SODIUM CHLORIDE 0.9% FLUSH
3.0000 mL | Freq: Two times a day (BID) | INTRAVENOUS | Status: DC
  Administered 2016-10-24 – 2016-10-26 (×4): 3 mL via INTRAVENOUS

## 2016-10-23 MED ORDER — METOPROLOL TARTRATE 25 MG PO TABS: 25.0000 mg | ORAL_TABLET | Freq: Two times a day (BID) | ORAL | Status: DC

## 2016-10-23 MED ORDER — METOPROLOL TARTRATE 25 MG PO TABS
25.0000 mg | ORAL_TABLET | Freq: Every day | ORAL | Status: DC
  Administered 2016-10-23 – 2016-10-25 (×3): 25 mg via ORAL
  Filled 2016-10-23 (×3): qty 1

## 2016-10-23 MED ORDER — SODIUM CHLORIDE 0.9 % IV SOLN
80.0000 mg | Freq: Once | INTRAVENOUS | Status: AC
  Administered 2016-10-23: 80 mg via INTRAVENOUS
  Filled 2016-10-23: qty 80

## 2016-10-23 MED ORDER — SODIUM CHLORIDE 0.9 % IV SOLN
8.0000 mg/h | INTRAVENOUS | Status: DC
  Administered 2016-10-23 – 2016-10-25 (×4): 8 mg/h via INTRAVENOUS
  Filled 2016-10-23 (×10): qty 80

## 2016-10-23 MED ORDER — GABAPENTIN 300 MG PO CAPS
600.0000 mg | ORAL_CAPSULE | Freq: Two times a day (BID) | ORAL | Status: DC
  Administered 2016-10-23 – 2016-10-26 (×6): 600 mg via ORAL
  Filled 2016-10-23 (×6): qty 2

## 2016-10-23 MED ORDER — CLOPIDOGREL BISULFATE 75 MG PO TABS
75.0000 mg | ORAL_TABLET | Freq: Every day | ORAL | Status: DC
  Administered 2016-10-24 – 2016-10-26 (×3): 75 mg via ORAL
  Filled 2016-10-23 (×3): qty 1

## 2016-10-23 MED ORDER — SODIUM CHLORIDE 0.45 % IV SOLN
INTRAVENOUS | Status: DC
  Administered 2016-10-23: 14:00:00 via INTRAVENOUS

## 2016-10-23 MED ORDER — SODIUM CHLORIDE 0.9 % IV SOLN
INTRAVENOUS | Status: AC
  Administered 2016-10-23: 23:00:00 via INTRAVENOUS

## 2016-10-23 MED ORDER — PROSIGHT PO TABS
1.0000 | ORAL_TABLET | Freq: Every day | ORAL | Status: DC
  Administered 2016-10-23 – 2016-10-25 (×3): 1 via ORAL
  Filled 2016-10-23 (×3): qty 1

## 2016-10-23 MED ORDER — ACETAMINOPHEN 500 MG PO TABS
1000.0000 mg | ORAL_TABLET | Freq: Four times a day (QID) | ORAL | Status: DC | PRN
  Administered 2016-10-23 – 2016-10-25 (×5): 1000 mg via ORAL
  Filled 2016-10-23 (×6): qty 2

## 2016-10-23 MED ORDER — HYDROCODONE-ACETAMINOPHEN 5-325 MG PO TABS
1.0000 | ORAL_TABLET | Freq: Four times a day (QID) | ORAL | Status: DC | PRN
  Administered 2016-10-24 – 2016-10-25 (×3): 1 via ORAL
  Filled 2016-10-23 (×4): qty 1

## 2016-10-23 MED ORDER — METOPROLOL TARTRATE 50 MG PO TABS
50.0000 mg | ORAL_TABLET | Freq: Every day | ORAL | Status: DC
  Administered 2016-10-24 – 2016-10-26 (×3): 50 mg via ORAL
  Filled 2016-10-23 (×3): qty 1

## 2016-10-23 MED ORDER — DIAZEPAM 5 MG PO TABS: 2.5000 mg | ORAL_TABLET | Freq: Every evening | ORAL | Status: DC | PRN

## 2016-10-23 MED ORDER — ONDANSETRON HCL 4 MG PO TABS: 4.0000 mg | ORAL_TABLET | Freq: Four times a day (QID) | ORAL | Status: DC | PRN

## 2016-10-23 MED ORDER — SODIUM CHLORIDE 0.9 % IV BOLUS (SEPSIS)
500.0000 mL | Freq: Once | INTRAVENOUS | Status: AC
  Administered 2016-10-23: 500 mL via INTRAVENOUS

## 2016-10-23 MED ORDER — ONDANSETRON HCL 4 MG/2ML IJ SOLN: 4.0000 mg | Freq: Four times a day (QID) | INTRAMUSCULAR | Status: DC | PRN

## 2016-10-23 MED ORDER — VITAMIN D 1000 UNITS PO TABS
2000.0000 [IU] | ORAL_TABLET | Freq: Every day | ORAL | Status: DC
  Administered 2016-10-23 – 2016-10-25 (×3): 2000 [IU] via ORAL
  Filled 2016-10-23 (×6): qty 2

## 2016-10-23 NOTE — Progress Notes (Signed)
[name] is a 82 y.o. female patient admitted from ED awake, alert - oriented  X 4 - no acute distress noted.  VSS - Blood pressure (!) 138/51, pulse 70, temperature 98.8 F (37.1 C), temperature source Oral, resp. rate 16, height 5\' 2"  (1.575 m), weight 60.8 kg (134 lb), SpO2 99 %.    IV in place, occlusive dsg intact without redness.  Orientation to room, and floor completed.  Admission INP armband ID verified with patient/family, and in place.   SR up x 2, fall assessment complete, with patient and family able to verbalize understanding of risk associated with falls, and verbalized understanding to call nsg before up out of bed.  Call light within reach, patient able to voice, and demonstrate understanding.  Skin, clean-dry- intact without evidence of bruising, or skin tears.   No evidence of skin break down noted on exam, but stage one pressure ulcer was noted on both heels. Pillow is put under the legs to float heel at this time.     Will cont to eval and treat per MD orders.  Dorris Carnes, RN 10/23/2016 6:53 PM

## 2016-10-23 NOTE — Progress Notes (Signed)
Report received from [place] at [facility] at [phone].

## 2016-10-23 NOTE — H&P (Signed)
History and Physical    [name] [id number] DOB: [birth year] DOA: 10/23/2016  PCP: Henrine Screws, MD   Patient coming from: Home, by way of [facility]   Chief Complaint: Bright red blood per rectum   HPI: [name] is a 82 y.o. female with medical history significant for hypertension, GERD, anxiety, paroxysmal atrial fibrillation, and severe PAD with chronic ischemia of the bilateral lower extremities status post numerous interventions who presents in transfer from [facility] where she was seen for bright red blood per rectum. Patient has undergone numerous vascular procedures bilateral lower extremities and was hospitalized in November of this year for a redo of her left femoropopliteal bypass which was complicated and ultimately required open exploration with endarterectomy and thrombectomy. She developed paroxysmal atrial fibrillation during that admission and was discharged home on Eliquis. She continued her previous medications of aspirin and Plavix as well. She has suffered from some continued foot pain since time of the recent discharge, but had otherwise been in her usual state until developing some diarrhea approximately 2 days ago. Patient mentioned to her daughter that the stool appeared very dark and so she was taken to her PCP yesterday for evaluation of this. While at the physician's office, she experienced an immediate urge to defecate and bright red blood was noted in the commode. Patient was instructed to stop the aspirin and Eliquis was at that time, and after discussion between the vascular surgeon and GI physician, it was advised that she continue Plavix. She was directed to the emergency department for further evaluation of the bright red blood per rectum. This hemoglobin had been checked at the PCP office and was reportedly 10.6 yesterday.  [facility] ED Course: Upon arrival to the [place] ED, patient is found to be afebrile, saturating well  on room air, and with stable vitals. Chemistry panel is notable for a BUN of 38 and serum creatinine of 1.09, both up relative to priors. INR is 1.00 and DRE reveals melanotic stool which is FOBT positive. GI was consulted by the ED physician and advised a medical admission for ongoing evaluation and management of acute GI bleed. She was treated with Tylenol for her foot pain, given a 500 mL normal saline bolus, and treated with IV Protonix bolus and infusion.  Patient is interviewed and examined on the telemetry unit at [facility] where she remains hemodynamically stable and in no acute respiratory distress. She is well appearing and is not pale.  Review of Systems:  All other systems reviewed and apart from HPI, are negative.  Past Medical History:  Diagnosis Date  . Anemia   . Anginal pain ([place])   . Arthritis    "qwhere" (09/05/2016)  . Chronic lower back pain   . Complication of anesthesia    "takes a long time for it to wear off; I can hallucinate if I take too much" (09/05/2016)  . DVT (deep venous thrombosis) ([place]) 10/2009  . Fall from steps 08/31/2013   Fx. pelvis, Left Hip, Left Elbow  . Fibromyalgia   . GERD (gastroesophageal reflux disease)    09/22/16- "no too much anymore"  . High cholesterol   . History of hiatal hernia   . Hypertension   . Macular degeneration, wet ([place])    "started in right eye; now legally blind in that eye; now started in left eye but pretty much in control" (09/05/2016)  . Osteoporosis   . Peripheral vascular disease ([place])   .  Squamous cell carcinoma of skin of right calf Aug. 2015  . Stroke ([place])    TIA's no residual  . TIA (transient ischemic attack)    "several at once; none in a long time" (09/05/2016)    Past Surgical History:  Procedure Laterality Date  . ABDOMINAL AORTAGRAM N/A 12/26/2014   Procedure: ABDOMINAL Maxcine Ham;  Surgeon: Serafina Mitchell, MD;  Location: [facility] CATH LAB;  Service: Cardiovascular;  Laterality: N/A;  .  AORTOGRAM N/A 09/26/2016   Procedure: AORTOGRAM;  Surgeon: Waynetta Sandy, MD;  Location: [place];  Service: Vascular;  Laterality: N/A;  . CARPAL TUNNEL RELEASE Right   . CATARACT EXTRACTION W/ INTRAOCULAR LENS  IMPLANT, BILATERAL Bilateral   . COLONOSCOPY    . DILATION AND CURETTAGE OF UTERUS    . EYE SURGERY Right    "macular OR"  . FEMORAL ARTERY STENT  12-11-10   Left SFA  . FEMORAL-POPLITEAL BYPASS GRAFT Left 09/24/2016   Procedure: REDO FEMORAL TO POPLITEAL ARTERY BYPASS GRAFT USING 6MM PROPATEN RINGED GORTEX GRAFT;  Surgeon: Serafina Mitchell, MD;  Location: [place];  Service: Vascular;  Laterality: Left;  . INCISION AND DRAINAGE OF WOUND Left 10/25/2009   leg/notes 11/13/2009  . INSERTION OF ILIAC STENT Left 12/26/2014   Procedure: INSERTION OF ILIAC STENT;  Surgeon: Serafina Mitchell, MD;  Location: [facility] CATH LAB;  Service: Cardiovascular;  Laterality: Left;  . INSERTION OF ILIAC STENT Left 09/26/2016   Procedure: SUB INTIMAL INSERTION OF SUPERFICIAL FEMORAL ARTERY AND BELOW KNEE BYPASS GRAFT;  Surgeon: Waynetta Sandy, MD;  Location: [place];  Service: Vascular;  Laterality: Left;  . JOINT REPLACEMENT     knee  . JOINT REPLACEMENT Left Oct. 17, 2014   Elbow ( pt fell 08-31-13 )  . ORIF SHOULDER FRACTURE Right    "it was crushed"  . PERIPHERAL VASCULAR CATHETERIZATION N/A 10/30/2015   Procedure: Abdominal Aortogram w/Lower Extremity;  Surgeon: Serafina Mitchell, MD;  Location: [place] CV LAB;  Service: Cardiovascular;  Laterality: N/A;  . PERIPHERAL VASCULAR CATHETERIZATION  10/30/2015   Procedure: Peripheral Vascular Intervention;  Surgeon: Serafina Mitchell, MD;  Location: [place] CV LAB;  Service: Cardiovascular;;  . PERIPHERAL VASCULAR CATHETERIZATION N/A 04/01/2016   Procedure: Abdominal Aortogram w/Lower Extremity;  Surgeon: Serafina Mitchell, MD;  Location: [place] CV LAB;  Service: Cardiovascular;  Laterality: N/A;  . PERIPHERAL VASCULAR CATHETERIZATION Left  04/01/2016   Procedure: Peripheral Vascular Atherectomy;  Surgeon: Serafina Mitchell, MD;  Location: [place] CV LAB;  Service: Cardiovascular;  Laterality: Left;  Superficial femoral artery.  Marland Kitchen PERIPHERAL VASCULAR CATHETERIZATION Right 09/05/2016   "stent"  . PERIPHERAL VASCULAR CATHETERIZATION N/A 09/05/2016   Procedure: Abdominal Aortogram w/Lower Extremity;  Surgeon: Serafina Mitchell, MD;  Location: [place] CV LAB;  Service: Cardiovascular;  Laterality: N/A;  . PERIPHERAL VASCULAR CATHETERIZATION Right 09/05/2016   Procedure: Peripheral Vascular Intervention;  Surgeon: Serafina Mitchell, MD;  Location: [place] CV LAB;  Service: Cardiovascular;  Laterality: Right;  Superficial Femoral  . PERIPHERAL VASCULAR CATHETERIZATION Left 09/09/2016   Procedure: Lower Extremity Angiography;  Surgeon: Serafina Mitchell, MD;  Location: [place] CV LAB;  Service: Cardiovascular;  Laterality: Left;  . PR VEIN BYPASS GRAFT,AORTO-FEM-POP  09-13-09   Left Fem-pop  . THROMBECTOMY FEMORAL ARTERY Right 09/26/2016   Procedure: Thromboembolectomy Right Lower Extremity, Right Femoral Artery Endarterectomy with Patch Angioplasty; Right Lower Extremity Angiogram ;  Surgeon: Waynetta Sandy, MD;  Location: [place];  Service: Vascular;  Laterality: Right;  . TOTAL ELBOW ARTHROPLASTY Left 09/03/2013   Procedure: LEFT TOTAL ELBOW ARTHROPLASTY;  Surgeon: Roseanne Kaufman, MD;  Location: [place];  Service: Orthopedics;  Laterality: Left;  . TOTAL KNEE ARTHROPLASTY Left 06/2006  . TUBAL LIGATION    . VAGINAL HYSTERECTOMY       reports that she quit smoking about 69 years ago. Her smoking use included Cigarettes. She has never used smokeless tobacco. She reports that she does not drink alcohol or use drugs.  Allergies  Allergen Reactions  . Motrin [Ibuprofen] Other (See Comments)    ADVERSE REACTION - GI BLEED  . Morphine And Related Other (See Comments)    HALLUCINATIONS REACTION IS SIDE EFFECT  . Oxycontin  [Oxycodone Hcl] Other (See Comments)    [REACTION IS SIDE EFFECT]  Hallinatations  . Statins Other (See Comments)    ADVERSE REACTION MUSCLE PAIN & WEAKNESS  . Promethazine Hcl Other (See Comments)    IV  Drug only, makes her act crazy  . Sulfa Antibiotics Nausea And Vomiting    Family History  Problem Relation Age of Onset  . Heart disease Father     Heart Disease before age 63  . Hyperlipidemia Father   . Hypertension Father   . Alcohol abuse Father   . Heart disease Brother   . Hyperlipidemia Brother   . Hypertension Brother   . Deep vein thrombosis Brother   . Heart disease Son     Heart Disease before age 63  . Hyperlipidemia Son   . Hypertension Son   . Heart attack Son   . Diabetes Son   . Hypertension Son   . Hyperlipidemia Sister   . Hypertension Sister      Prior to Admission medications   Medication Sig Start Date End Date Taking? Authorizing Provider  acetaminophen (TYLENOL) 500 MG tablet Take 1,000 mg by mouth 2 (two) times daily as needed for moderate pain or headache. Patient took this medication for her pain.    Historical Provider, MD  apixaban (ELIQUIS) 2.5 MG TABS tablet Take 1 tablet (2.5 mg total) by mouth 2 (two) times daily. 10/03/16   Alvia Grove, PA-C  aspirin 81 MG tablet Take 81 mg by mouth daily.    Historical Provider, MD  Cholecalciferol (VITAMIN D3) 2000 UNITS TABS Take 2,000 Units by mouth at bedtime.     Historical Provider, MD  clopidogrel (PLAVIX) 75 MG tablet Take 75 mg by mouth daily.    Historical Provider, MD  Cyanocobalamin (VITAMIN B-12 PO) Take 1 tablet by mouth daily.    Historical Provider, MD  diazepam (VALIUM) 5 MG tablet Take 2.5 mg by mouth at bedtime as needed for anxiety.     Historical Provider, MD  gabapentin (NEURONTIN) 300 MG capsule Take 2 capsules (600 mg total) by mouth 2 (two) times daily. 10/03/16   Alvia Grove, PA-C  HYDROcodone-acetaminophen (NORCO/VICODIN) 5-325 MG tablet Take 1 tablet by mouth every 6  (six) hours as needed. 10/03/16   Alvia Grove, PA-C  loperamide (IMODIUM) 2 MG capsule Take 2 mg by mouth as needed for diarrhea or loose stools.    Historical Provider, MD  metoprolol (LOPRESSOR) 50 MG tablet Take 1 tablet (50 mg total) by mouth 2 (two) times daily. Take 50mg  by mouth in the morning and take 25mg  by mouth at bedtime. 10/03/16   Alvia Grove, PA-C  Multiple Vitamins-Minerals (EYE VITAMINS PO) Take 1 tablet by mouth at bedtime.  Historical Provider, MD  traMADol (ULTRAM) 50 MG tablet Take 50 mg by mouth at bedtime as needed for moderate pain.     Historical Provider, MD  valsartan-hydrochlorothiazide (DIOVAN-HCT) 160-12.5 MG per tablet Take 0.5 tablets by mouth daily.    Historical Provider, MD    Physical Exam: Vitals:   10/23/16 1630 10/23/16 1642 10/23/16 1827 10/23/16 1832  BP: 140/72 140/72  (!) 138/51  Pulse:  74  70  Resp: 18 18  16   Temp:    98.8 F (37.1 C)  TempSrc:    Oral  SpO2:  100%  99%  Weight:   60.8 kg (134 lb)   Height:   5\' 2"  (1.575 m)       Constitutional: NAD, calm, comfortable Eyes: PERTLA, lids and conjunctivae normal ENMT: Mucous membranes are moist. Posterior pharynx clear of any exudate or lesions.   Neck: normal, supple, no masses, no thyromegaly Respiratory: clear to auscultation bilaterally, no wheezing, no crackles. Normal respiratory effort.    Cardiovascular: S1 & S2 heard, regular rate and rhythm. No extremity edema. No significant JVD. Abdomen: No distension, no tenderness, no masses palpated. Bowel sounds normal.  Musculoskeletal: no clubbing / cyanosis. No joint deformity upper and lower extremities. Normal muscle tone.  Skin: Surgical incision in right medial leg c/d/i with crust, no drainage, nontender. Warm, dry, well-perfused. Neurologic: CN 2-12 grossly intact. Sensation intact, DTR normal. Strength 5/5 in all 4 limbs.  Psychiatric: Normal judgment and insight. Alert and oriented x 3. Normal mood and affect.      Labs on Admission: I have personally reviewed following labs and imaging studies  CBC:  Recent Labs Lab 10/23/16 1145  WBC 8.4  NEUTROABS 5.3  HGB 9.7*  HCT 30.9*  MCV 93.6  PLT [id number]   Basic Metabolic Panel:  Recent Labs Lab 10/23/16 1145  NA 135  K 4.1  CL 103  CO2 23  GLUCOSE 110*  BUN 38*  CREATININE 1.09*  CALCIUM 9.4   GFR: Estimated Creatinine Clearance: 31.8 mL/min (by C-G formula based on SCr of 1.09 mg/dL (H)). Liver Function Tests: No results for input(s): AST, ALT, ALKPHOS, BILITOT, PROT, ALBUMIN in the last 168 hours. No results for input(s): LIPASE, AMYLASE in the last 168 hours. No results for input(s): AMMONIA in the last 168 hours. Coagulation Profile:  Recent Labs Lab 10/23/16 1145  INR 1.00   Cardiac Enzymes: No results for input(s): CKTOTAL, CKMB, CKMBINDEX, TROPONINI in the last 168 hours. BNP (last 3 results) No results for input(s): PROBNP in the last 8760 hours. HbA1C: No results for input(s): HGBA1C in the last 72 hours. CBG: No results for input(s): GLUCAP in the last 168 hours. Lipid Profile: No results for input(s): CHOL, HDL, LDLCALC, TRIG, CHOLHDL, LDLDIRECT in the last 72 hours. Thyroid Function Tests: No results for input(s): TSH, T4TOTAL, FREET4, T3FREE, THYROIDAB in the last 72 hours. Anemia Panel: No results for input(s): VITAMINB12, FOLATE, FERRITIN, TIBC, IRON, RETICCTPCT in the last 72 hours. Urine analysis:    Component Value Date/Time   COLORURINE YELLOW 09/04/2013 0130   APPEARANCEUR CLEAR 09/04/2013 0130   LABSPEC 1.014 09/04/2013 0130   PHURINE 5.0 09/04/2013 0130   GLUCOSEU NEGATIVE 09/04/2013 0130   HGBUR NEGATIVE 09/04/2013 0130   BILIRUBINUR NEGATIVE 09/04/2013 0130   KETONESUR NEGATIVE 09/04/2013 0130   PROTEINUR NEGATIVE 09/04/2013 0130   UROBILINOGEN 0.2 09/04/2013 0130   NITRITE NEGATIVE 09/04/2013 0130   LEUKOCYTESUR TRACE (A) 09/04/2013 0130   Sepsis  Labs: @LABRCNTIP (procalcitonin:4,lacticidven:4) )No  results found for this or any previous visit (from the past 240 hour(s)).   Radiological Exams on Admission: No results found.  EKG: Not performed, will obtain as appropriate.   Assessment/Plan  1. Acute GI bleed with blood-loss anemia  - Pt reports dark diarrhea 12/6 and was evaluated in physicians office for this on 12/6, where she had bright red blood per rectum after sudden urge to defecate  - She was taken off of Eliquis (recent diagnosis of PAF) and ASA, but left on Plavix given recent stenting of left femoral artery  - Pt reports some fleeting abd pains yesterday, but none since  - Hgb was reportedly 10.6 at Dr's office 12/6, then 9.7 on arrival to [facility] on 12/7 (had been in [place] at discharge from recent hospitalization during which she was transfused with 2 units) - Stool has returned to dark today with no further BRB seen - She was given bolus of IV Protonix at [facility] and started on Protonix infusion  - GI is consulting and much appreciated, will f/u recs  - Plan to obtain type & screen, continue gentle IVF hydration with NS, continue PPI infusion, check repeat H&H in several hours    2. Peripheral arterial disease  - Pt is s/p numerous bilateral procedures and has chronic foot ischemia  - Per notes, GI and vascular surgery have agreed that Plavix will be continued given recent stenting  - Feet are warm and pink on admission with pulses difficult to palpate b/l  - Feet are elevated for comfort    3. Paroxysmal atrial fibrillation   - Went into a fib during recent hospitalization and was started on Eliquis  - Is in a regular rhythm on admission  - CHADS-VASc at least 64 (age x2, gender, PAD, HTN)  - Eliquis was stopped on 10/22/16 d/t GI bleeding  - She had been on ASA, Plavix, and Eliquis at time of bleeding; currently Plavix only - She is being monitored on telemetry   4. Hypertension - At goal currently  - Managed with  Lopressor, valsartan, and HCTZ at home - Valsartan and HCTZ are held on admission in light of diarrhea with dehydration  - Lopressor continued with holding parameters    5. Anxiety  - Appears to be stable on admission  - Continue low-dose prn Valium    DVT prophylaxis: No pharmacologic ppx d/t active bleed; no SCD d/t severe PAD with chronic foot ischemia; was on Eliquis, ASA, and Plavix as recently as 10/22/16 and is continued on Plavix now Code Status: Full  Family Communication: Daughter updated at bedside at patient's request  Disposition Plan: Observe on telemetry  Consults called: GI  Admission status: Observation    Vianne Bulls, MD [facility] Pager [phone]  If 7PM-7AM, please contact night-coverage [email or web address] Password [facility]  10/23/2016, 7:47 PM

## 2016-10-23 NOTE — Progress Notes (Signed)
82yo F with past medical history sig for transient ischemic attack, skin cancer, hypertension, high cholesterol, fibromyalgia, DVT, anemia and angina presented to her primary care doctor yesterday with complaint of blood per rectum. Overnight after being sent home patient cont oto have bloody stools. Her primary care doctor had advised her to stop her aspirin & Eliquis. Patient presented to the emergency room with complaint of continued lower GI bleed. Patient had no bloody stools and the ED but did have frank melena on examination. Of note patient's hemoglobin at her primary care doctor office was 10.6 and is slightly down today. Patient is however symptomatic from her anemia mainly with weakness and fatigue. Vitals have been stable. Patient's vascular surgeon states that in light of her recent stent placement it is too early to stop her Plavix. The ED providers spoke to Dr. Paulita Fujita with [facility] about this and the decision was made to keep the Plavix and continue her Protonix and standard management. Obs tele.  Elwin Mocha MD

## 2016-10-23 NOTE — ED Notes (Signed)
Report to [place], Therapist, sports on 5W - [place].

## 2016-10-23 NOTE — Telephone Encounter (Signed)
Chart reviewed in depth with Dr. Angelena Form this morning. The patient was recently admitted with PV surgery further complicated by acute ischemia post-op requiring return to OR for thrombectomy. Patient needs additional input from GI and vascular regarding this issue and we feel it is safer for her to present to the ER to have this evaluated rather than try to manage as outpatient over what could take weeks. From a cardiology standpoint she could stop the Eliquis for her atrial fib (understanding she would have risk of stroke) but the bigger question is when she is safe to resume this, especially in light of above complex vascular problems. Her Hgb had gone from 13 down to 6 range as inpatient requiring transfusion so we feel this is the safest approach. The nurse and I spoke with patient's daughter this AM to relay recs. Dayna Dunn PA-C

## 2016-10-23 NOTE — ED Triage Notes (Addendum)
Per pt and daughter-C/o rectal bleeding-started yesterday-was seen by PCP yesterday-labs and UA done for urinary freq-cards was also contacted via phone-was advised to d/c ASA and eloquis yesterday-pt was advised to come to ED today-pt presents to triage in own w/c-NAD

## 2016-10-23 NOTE — Progress Notes (Signed)
[name] is a 82 y.o. female patient admitted from ED awake, alert - oriented  X 4 - no acute distress noted.  VSS - Blood pressure (!) 138/51, pulse 70, temperature 98.8 F (37.1 C), temperature source Oral, resp. rate 16, height 5\' 2"  (1.575 m), weight 60.8 kg (134 lb), SpO2 99 %.    IV in place, occlusive dsg intact without redness.  Orientation to room, and floor completed with information packet given to patient/family.  Patient declined safety video at this time.  Admission INP armband ID verified with patient/family, and in place.   SR up x 2, fall assessment complete, with patient and family able to verbalize understanding of risk associated with falls, and verbalized understanding to call nsg before up out of bed.  Call light within reach, patient able to voice, and demonstrate understanding.  Skin, clean-dry- intact without evidence of bruising, or skin tears.   No evidence of skin break down noted on exam,but a stage one pressure ulcer was noted on both heels. A pillow is put under the legs to float heels.     Will cont to eval and treat per MD orders.  Dorris Carnes, RN 10/23/2016 6:40 PM

## 2016-10-23 NOTE — ED Provider Notes (Signed)
[facility] DEPT [facility] Provider Note   CSN: [id number] Arrival date & time: 10/23/16  1112     History   Chief Complaint Chief Complaint  Patient presents with  . Rectal Bleeding    HPI [name] is a 82 y.o. female.  82 yo F with a chief complaint of rectal bleeding. The started yesterday patient had some dark and tarry stool. Then had a bright red bloody bowel movement at her doctor's office. Had a normal hemoglobin. She had been inadvertently taking 3 blood thinners. The 2 that she was supposed to after being discharged from a vascular procedure. She has multiple recent stents placed and so they suggested she continue taking Plavix. She was on Eliquis and aspirin as well. She feels that the bleeding has somewhat improved today. That is no longer bright red and bloody. However she is having still dark and tarry stools. She is feeling lightheaded and weak. She had shortness of breath while just trying to put her shirt on.   The history is provided by the patient and a relative.  Rectal Bleeding  Quality:  Bright red and black and tarry Amount:  Copious Duration:  2 days Timing:  Constant Chronicity:  New Similar prior episodes: no   Relieved by:  Nothing Worsened by:  Nothing Ineffective treatments:  None tried Associated symptoms: abdominal pain   Associated symptoms: no dizziness, no fever and no vomiting   Risk factors: anticoagulant use     Past Medical History:  Diagnosis Date  . Anemia   . Anginal pain ([place])   . Arthritis    "qwhere" (09/05/2016)  . Chronic lower back pain   . Complication of anesthesia    "takes a long time for it to wear off; I can hallucinate if I take too much" (09/05/2016)  . DVT (deep venous thrombosis) ([place]) 10/2009  . Fall from steps 08/31/2013   Fx. pelvis, Left Hip, Left Elbow  . Fibromyalgia   . GERD (gastroesophageal reflux disease)    09/22/16- "no too much anymore"  . High cholesterol   . History of hiatal hernia   .  Hypertension   . Macular degeneration, wet ([place])    "started in right eye; now legally blind in that eye; now started in left eye but pretty much in control" (09/05/2016)  . Osteoporosis   . Peripheral vascular disease ([place])   . Squamous cell carcinoma of skin of right calf Aug. 2015  . Stroke [facility])    TIA's no residual  . TIA (transient ischemic attack)    "several at once; none in a long time" (09/05/2016)    Patient Active Problem List   Diagnosis Date Noted  . Acute GI bleeding 10/23/2016  . Paroxysmal atrial fibrillation ([place]) 10/03/2016  . 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 PAD (peripheral artery disease) ([place]) 09/05/2016  . Groin pain 04/02/2015  . Aftercare following surgery of the circulatory system, [place] 12/13/2013  . Shingles 10/26/2013  . Depression 10/26/2013  . Acute blood loss anemia 09/05/2013  . Elbow fracture, left 09/05/2013  . Left acetabular fracture ([place]) 09/05/2013  . Ankle fracture, left 09/05/2013  . HTN (hypertension) 09/03/2013  . HLD (hyperlipidemia) 09/03/2013  . Peripheral vascular disease, unspecified 05/10/2012  . Chronic total occlusion of artery of the extremities ([place]) 01/19/2012    Past Surgical History:  Procedure Laterality Date  . ABDOMINAL AORTAGRAM N/A 12/26/2014   Procedure: ABDOMINAL AORTAGRAM;  Surgeon: Serafina Mitchell, MD;  Location: [facility] CATH LAB;  Service: Cardiovascular;  Laterality: N/A;  . AORTOGRAM N/A 09/26/2016   Procedure: AORTOGRAM;  Surgeon: Waynetta Sandy, MD;  Location: [place];  Service: Vascular;  Laterality: N/A;  . CARPAL TUNNEL RELEASE Right   . CATARACT EXTRACTION W/ INTRAOCULAR LENS  IMPLANT, BILATERAL Bilateral   . COLONOSCOPY    . DILATION AND CURETTAGE OF UTERUS    . EYE SURGERY Right    "macular OR"  . FEMORAL ARTERY STENT  12-11-10   Left SFA  . FEMORAL-POPLITEAL BYPASS GRAFT Left 09/24/2016   Procedure: REDO FEMORAL TO  POPLITEAL ARTERY BYPASS GRAFT USING 6MM PROPATEN RINGED GORTEX GRAFT;  Surgeon: Serafina Mitchell, MD;  Location: [place];  Service: Vascular;  Laterality: Left;  . INCISION AND DRAINAGE OF WOUND Left 10/25/2009   leg/notes 11/13/2009  . INSERTION OF ILIAC STENT Left 12/26/2014   Procedure: INSERTION OF ILIAC STENT;  Surgeon: Serafina Mitchell, MD;  Location: [facility] CATH LAB;  Service: Cardiovascular;  Laterality: Left;  . INSERTION OF ILIAC STENT Left 09/26/2016   Procedure: SUB INTIMAL INSERTION OF SUPERFICIAL FEMORAL ARTERY AND BELOW KNEE BYPASS GRAFT;  Surgeon: Waynetta Sandy, MD;  Location: [place];  Service: Vascular;  Laterality: Left;  . JOINT REPLACEMENT     knee  . JOINT REPLACEMENT Left Oct. 17, 2014   Elbow ( pt fell 08-31-13 )  . ORIF SHOULDER FRACTURE Right    "it was crushed"  . PERIPHERAL VASCULAR CATHETERIZATION N/A 10/30/2015   Procedure: Abdominal Aortogram w/Lower Extremity;  Surgeon: Serafina Mitchell, MD;  Location: [place] CV LAB;  Service: Cardiovascular;  Laterality: N/A;  . PERIPHERAL VASCULAR CATHETERIZATION  10/30/2015   Procedure: Peripheral Vascular Intervention;  Surgeon: Serafina Mitchell, MD;  Location: [place] CV LAB;  Service: Cardiovascular;;  . PERIPHERAL VASCULAR CATHETERIZATION N/A 04/01/2016   Procedure: Abdominal Aortogram w/Lower Extremity;  Surgeon: Serafina Mitchell, MD;  Location: [place] CV LAB;  Service: Cardiovascular;  Laterality: N/A;  . PERIPHERAL VASCULAR CATHETERIZATION Left 04/01/2016   Procedure: Peripheral Vascular Atherectomy;  Surgeon: Serafina Mitchell, MD;  Location: [place] CV LAB;  Service: Cardiovascular;  Laterality: Left;  Superficial femoral artery.  Marland Kitchen PERIPHERAL VASCULAR CATHETERIZATION Right 09/05/2016   "stent"  . PERIPHERAL VASCULAR CATHETERIZATION N/A 09/05/2016   Procedure: Abdominal Aortogram w/Lower Extremity;  Surgeon: Serafina Mitchell, MD;  Location: [place] CV LAB;  Service: Cardiovascular;  Laterality: N/A;  .  PERIPHERAL VASCULAR CATHETERIZATION Right 09/05/2016   Procedure: Peripheral Vascular Intervention;  Surgeon: Serafina Mitchell, MD;  Location: [place] CV LAB;  Service: Cardiovascular;  Laterality: Right;  Superficial Femoral  . PERIPHERAL VASCULAR CATHETERIZATION Left 09/09/2016   Procedure: Lower Extremity Angiography;  Surgeon: Serafina Mitchell, MD;  Location: [place] CV LAB;  Service: Cardiovascular;  Laterality: Left;  . PR VEIN BYPASS GRAFT,AORTO-FEM-POP  09-13-09   Left Fem-pop  . THROMBECTOMY FEMORAL ARTERY Right 09/26/2016   Procedure: Thromboembolectomy Right Lower Extremity, Right Femoral Artery Endarterectomy with Patch Angioplasty; Right Lower Extremity Angiogram ;  Surgeon: Waynetta Sandy, MD;  Location: [place];  Service: Vascular;  Laterality: Right;  . TOTAL ELBOW ARTHROPLASTY Left 09/03/2013   Procedure: LEFT TOTAL ELBOW ARTHROPLASTY;  Surgeon: Roseanne Kaufman, MD;  Location: [place];  Service: Orthopedics;  Laterality: Left;  . TOTAL KNEE ARTHROPLASTY Left 06/2006  . TUBAL LIGATION    . VAGINAL HYSTERECTOMY      OB History  No data available       Home Medications    Prior to Admission medications   Medication Sig Start Date End Date Taking? Authorizing Provider  acetaminophen (TYLENOL) 500 MG tablet Take 1,000 mg by mouth 2 (two) times daily as needed for moderate pain or headache. Patient took this medication for her pain.    Historical Provider, MD  apixaban (ELIQUIS) 2.5 MG TABS tablet Take 1 tablet (2.5 mg total) by mouth 2 (two) times daily. 10/03/16   Alvia Grove, PA-C  aspirin 81 MG tablet Take 81 mg by mouth daily.    Historical Provider, MD  Cholecalciferol (VITAMIN D3) 2000 UNITS TABS Take 2,000 Units by mouth at bedtime.     Historical Provider, MD  clopidogrel (PLAVIX) 75 MG tablet Take 75 mg by mouth daily.    Historical Provider, MD  Cyanocobalamin (VITAMIN B-12 PO) Take 1 tablet by mouth daily.    Historical Provider, MD  diazepam  (VALIUM) 5 MG tablet Take 2.5 mg by mouth at bedtime as needed for anxiety.     Historical Provider, MD  gabapentin (NEURONTIN) 300 MG capsule Take 2 capsules (600 mg total) by mouth 2 (two) times daily. 10/03/16   Alvia Grove, PA-C  HYDROcodone-acetaminophen (NORCO/VICODIN) 5-325 MG tablet Take 1 tablet by mouth every 6 (six) hours as needed. 10/03/16   Alvia Grove, PA-C  loperamide (IMODIUM) 2 MG capsule Take 2 mg by mouth as needed for diarrhea or loose stools.    Historical Provider, MD  metoprolol (LOPRESSOR) 50 MG tablet Take 1 tablet (50 mg total) by mouth 2 (two) times daily. Take 50mg  by mouth in the morning and take 25mg  by mouth at bedtime. 10/03/16   Alvia Grove, PA-C  Multiple Vitamins-Minerals (EYE VITAMINS PO) Take 1 tablet by mouth at bedtime.     Historical Provider, MD  traMADol (ULTRAM) 50 MG tablet Take 50 mg by mouth at bedtime as needed for moderate pain.     Historical Provider, MD  valsartan-hydrochlorothiazide (DIOVAN-HCT) 160-12.5 MG per tablet Take 0.5 tablets by mouth daily.    Historical Provider, MD    Family History Family History  Problem Relation Age of Onset  . Heart disease Father     Heart Disease before age 67  . Hyperlipidemia Father   . Hypertension Father   . Alcohol abuse Father   . Heart disease Brother   . Hyperlipidemia Brother   . Hypertension Brother   . Deep vein thrombosis Brother   . Heart disease Son     Heart Disease before age 34  . Hyperlipidemia Son   . Hypertension Son   . Heart attack Son   . Diabetes Son   . Hypertension Son   . Hyperlipidemia Sister   . Hypertension Sister     Social History Social History  Substance Use Topics  . Smoking status: Former Smoker    Types: Cigarettes    Quit date: 11/17/1946  . Smokeless tobacco: Never Used     Comment: "never smoked much"  . Alcohol use No     Allergies   Motrin [ibuprofen]; Morphine and related; Oxycontin [oxycodone hcl]; Statins; Promethazine hcl; and  Sulfa antibiotics   Review of Systems Review of Systems  Constitutional: Negative for chills and fever.  HENT: Negative for congestion and rhinorrhea.   Eyes: Negative for redness and visual disturbance.  Respiratory: Negative for shortness of breath and wheezing.   Cardiovascular: Negative for chest pain and palpitations.  Gastrointestinal:  Positive for abdominal pain, blood in stool and hematochezia. Negative for nausea and vomiting.  Genitourinary: Negative for dysuria and urgency.  Musculoskeletal: Negative for arthralgias and myalgias.  Skin: Negative for pallor and wound.  Neurological: Negative for dizziness and headaches.     Physical Exam Updated Vital Signs BP 129/78 (BP Location: Right Arm)   Pulse 60   Temp 97.7 F (36.5 C) (Oral)   Resp 20   Ht 5\' 2"  (1.575 m)   Wt 123 lb (55.8 kg)   BMI 22.50 kg/m   Physical Exam  Constitutional: She is oriented to person, place, and time. She appears well-developed and well-nourished. No distress.  HENT:  Head: Normocephalic and atraumatic.  Eyes: EOM are normal. Pupils are equal, round, and reactive to light.  Neck: Normal range of motion. Neck supple.  Cardiovascular: Normal rate and regular rhythm.  Exam reveals no gallop and no friction rub.   No murmur heard. Pulmonary/Chest: Effort normal. She has no wheezes. She has no rales.  Abdominal: Soft. She exhibits no distension and no mass. There is no tenderness. There is no rebound and no guarding.  Genitourinary: Rectal exam shows no external hemorrhoid, no internal hemorrhoid and no tenderness.  Genitourinary Comments: Gross melena on exam  Musculoskeletal: She exhibits no edema or tenderness.  Neurological: She is alert and oriented to person, place, and time.  Skin: Skin is warm and dry. She is not diaphoretic.  Psychiatric: She has a normal mood and affect. Her behavior is normal.  Nursing note and vitals reviewed.    ED Treatments / Results  Labs (all labs  ordered are listed, but only abnormal results are displayed) Labs Reviewed  CBC WITH DIFFERENTIAL/PLATELET - Abnormal; Notable for the following:       Result Value   RBC 3.30 (*)    Hemoglobin 9.7 (*)    HCT 30.9 (*)    RDW 18.9 (*)    All other components within normal limits  BASIC METABOLIC PANEL - Abnormal; Notable for the following:    Glucose, Bld 110 (*)    BUN 38 (*)    Creatinine, Ser 1.09 (*)    GFR calc non Af Amer 45 (*)    GFR calc Af Amer 52 (*)    All other components within normal limits  OCCULT BLOOD X 1 CARD TO LAB, STOOL - Abnormal; Notable for the following:    Fecal Occult Bld POSITIVE (*)    All other components within normal limits  PROTIME-INR  APTT  POC OCCULT BLOOD, ED    EKG  EKG Interpretation None       Radiology No results found.  Procedures Procedures (including critical care time)  Medications Ordered in ED Medications  pantoprazole (PROTONIX) 80 mg in sodium chloride 0.9 % 100 mL IVPB (not administered)  pantoprazole (PROTONIX) 80 mg in sodium chloride 0.9 % 250 mL (0.32 mg/mL) infusion (not administered)  pantoprazole (PROTONIX) 40 MG injection (not administered)  0.45 % sodium chloride infusion (not administered)  sodium chloride 0.9 % bolus 500 mL (0 mLs Intravenous Stopped 10/23/16 1236)     Initial Impression / Assessment and Plan / ED Course  I have reviewed the triage vital signs and the nursing notes.  Pertinent labs & imaging results that were available during my care of the patient were reviewed by me and considered in my medical decision making (see chart for details).  Clinical Course     82 yo F With a chief complaint of  bright red blood per rectum. Patient is complicated patient with multiple blood thinners after having a vascular procedure done. Is very unlikely that she'll be able to fully come off of anticoagulation.   Hemoglobin is 9.7. According to family this is down from a hemoglobin that was drawn  yesterday at PCPs office. Reportably that was 10.6.  I discussed the case with Dr. Paulita Fujita, gastroenterology. He agreed with admission.   Started on protonix drip.   CRITICAL CARE Performed by: Cecilio Asper   Total critical care time: 30 minutes  Critical care time was exclusive of separately billable procedures and treating other patients.  Critical care was necessary to treat or prevent imminent or life-threatening deterioration.  Critical care was time spent personally by me on the following activities: development of treatment plan with patient and/or surrogate as well as nursing, discussions with consultants, evaluation of patient's response to treatment, examination of patient, obtaining history from patient or surrogate, ordering and performing treatments and interventions, ordering and review of laboratory studies, ordering and review of radiographic studies, pulse oximetry and re-evaluation of patient's condition.  The patients results and plan were reviewed and discussed.   Any x-rays performed were independently reviewed by myself.   Differential diagnosis were considered with the presenting HPI.  Medications  pantoprazole (PROTONIX) 80 mg in sodium chloride 0.9 % 100 mL IVPB (not administered)  pantoprazole (PROTONIX) 80 mg in sodium chloride 0.9 % 250 mL (0.32 mg/mL) infusion (not administered)  pantoprazole (PROTONIX) 40 MG injection (not administered)  0.45 % sodium chloride infusion (not administered)  sodium chloride 0.9 % bolus 500 mL (0 mLs Intravenous Stopped 10/23/16 1236)    Vitals:   10/23/16 1125 10/23/16 1126  BP:  129/78  Pulse:  60  Resp:  20  Temp:  97.7 F (36.5 C)  TempSrc:  Oral  Weight: 123 lb (55.8 kg)   Height: 5\' 2"  (1.575 m)     Final diagnoses:  Acute GI bleeding    Admission/ observation were discussed with the admitting physician, patient and/or family and they are comfortable with the plan.   Final Clinical Impressions(s)  / ED Diagnoses   Final diagnoses:  Acute GI bleeding    New Prescriptions New Prescriptions   No medications on file     Deno Etienne, DO 10/23/16 1337

## 2016-10-24 ENCOUNTER — Encounter (HOSPITAL_COMMUNITY): Payer: Self-pay | Admitting: General Practice

## 2016-10-24 DIAGNOSIS — I7092 Chronic total occlusion of artery of the extremities: Secondary | ICD-10-CM | POA: Diagnosis not present

## 2016-10-24 DIAGNOSIS — G8929 Other chronic pain: Secondary | ICD-10-CM | POA: Diagnosis present

## 2016-10-24 DIAGNOSIS — R531 Weakness: Secondary | ICD-10-CM | POA: Diagnosis not present

## 2016-10-24 DIAGNOSIS — M797 Fibromyalgia: Secondary | ICD-10-CM | POA: Diagnosis present

## 2016-10-24 DIAGNOSIS — Z66 Do not resuscitate: Secondary | ICD-10-CM | POA: Diagnosis present

## 2016-10-24 DIAGNOSIS — M79605 Pain in left leg: Secondary | ICD-10-CM | POA: Diagnosis present

## 2016-10-24 DIAGNOSIS — Z90710 Acquired absence of both cervix and uterus: Secondary | ICD-10-CM | POA: Diagnosis not present

## 2016-10-24 DIAGNOSIS — H353290 Exudative age-related macular degeneration, unspecified eye, stage unspecified: Secondary | ICD-10-CM | POA: Diagnosis present

## 2016-10-24 DIAGNOSIS — F419 Anxiety disorder, unspecified: Secondary | ICD-10-CM | POA: Diagnosis present

## 2016-10-24 DIAGNOSIS — D649 Anemia, unspecified: Secondary | ICD-10-CM | POA: Diagnosis not present

## 2016-10-24 DIAGNOSIS — E860 Dehydration: Secondary | ICD-10-CM | POA: Diagnosis present

## 2016-10-24 DIAGNOSIS — K580 Irritable bowel syndrome with diarrhea: Secondary | ICD-10-CM | POA: Diagnosis present

## 2016-10-24 DIAGNOSIS — K922 Gastrointestinal hemorrhage, unspecified: Secondary | ICD-10-CM | POA: Diagnosis not present

## 2016-10-24 DIAGNOSIS — H5440 Blindness, one eye, unspecified eye: Secondary | ICD-10-CM | POA: Diagnosis present

## 2016-10-24 DIAGNOSIS — M79604 Pain in right leg: Secondary | ICD-10-CM | POA: Diagnosis present

## 2016-10-24 DIAGNOSIS — E7800 Pure hypercholesterolemia, unspecified: Secondary | ICD-10-CM | POA: Diagnosis present

## 2016-10-24 DIAGNOSIS — K219 Gastro-esophageal reflux disease without esophagitis: Secondary | ICD-10-CM | POA: Diagnosis present

## 2016-10-24 DIAGNOSIS — K921 Melena: Secondary | ICD-10-CM | POA: Diagnosis not present

## 2016-10-24 DIAGNOSIS — Z961 Presence of intraocular lens: Secondary | ICD-10-CM | POA: Diagnosis present

## 2016-10-24 DIAGNOSIS — I10 Essential (primary) hypertension: Secondary | ICD-10-CM | POA: Diagnosis not present

## 2016-10-24 DIAGNOSIS — Z96652 Presence of left artificial knee joint: Secondary | ICD-10-CM | POA: Diagnosis present

## 2016-10-24 DIAGNOSIS — M810 Age-related osteoporosis without current pathological fracture: Secondary | ICD-10-CM | POA: Diagnosis present

## 2016-10-24 DIAGNOSIS — I739 Peripheral vascular disease, unspecified: Secondary | ICD-10-CM | POA: Diagnosis not present

## 2016-10-24 DIAGNOSIS — Z9842 Cataract extraction status, left eye: Secondary | ICD-10-CM | POA: Diagnosis not present

## 2016-10-24 DIAGNOSIS — Z9841 Cataract extraction status, right eye: Secondary | ICD-10-CM | POA: Diagnosis not present

## 2016-10-24 DIAGNOSIS — M545 Low back pain: Secondary | ICD-10-CM | POA: Diagnosis present

## 2016-10-24 DIAGNOSIS — D62 Acute posthemorrhagic anemia: Secondary | ICD-10-CM | POA: Diagnosis not present

## 2016-10-24 DIAGNOSIS — K625 Hemorrhage of anus and rectum: Secondary | ICD-10-CM | POA: Diagnosis not present

## 2016-10-24 DIAGNOSIS — I480 Paroxysmal atrial fibrillation: Secondary | ICD-10-CM | POA: Diagnosis present

## 2016-10-24 HISTORY — DX: Gastrointestinal hemorrhage, unspecified: K92.2

## 2016-10-24 LAB — HEMOGLOBIN: HEMOGLOBIN: 7.2 g/dL — AB (ref 12.0–15.0)

## 2016-10-24 LAB — HEMATOCRIT: HEMATOCRIT: 21.9 % — AB (ref 36.0–46.0)

## 2016-10-24 LAB — PREPARE RBC (CROSSMATCH)

## 2016-10-24 MED ORDER — SODIUM CHLORIDE 0.9 % IV SOLN
Freq: Once | INTRAVENOUS | Status: AC
  Administered 2016-10-24: 16:00:00 via INTRAVENOUS

## 2016-10-24 NOTE — Progress Notes (Signed)
PROGRESS NOTE  [name] [id number] DOB: 1928/01/05 DOA: 10/23/2016 PCP: Henrine Screws, MD  Brief History:  82 year old female with a history of hypertension, paroxysmal atrial fibrillation, peripheral arterial occlusive disease with chronic ischemia of the bilateral lower extremities status post numerous interventions on her most recent admission presented with hematochezia. The patient was recently discharged from the hospital after admission from 09/24/2016. 10/10/2016 during which she had numerous vascular interventions including a left femoral-popliteal bypass redo. This was complicated by an occlusion of the bypass which ultimately required open exploration with endarterectomy and thrombectomy with placement of the stent in the left femoral artery. During that admission, the patient developed paroxysmal atrial fibrillation and was discharged home on apixiban in addition to her aspirin and Plavix. On 10/22/2016, the patient had dark stools at home. She went to see her primary care provider. During her office visit, the patient had an episode of hematochezia. After discussion with vascular surgery and gastroenterology, aspirin and apixiban were discontinued, but the patient was continued on Plavix secondary to her recent stent. She was directed to come to the emergency department that day, but she did not present to the emergency department until 10/23/2016. Hemoglobin was reported to be 10.6 at the PCP office. Hemoglobin was 9.7 in the emergency department. Since admission, the patient has not had any further episodes of hematochezia. She was started on a Protonix drip, and GI was consulted.   Assessment/Plan:  acute blood loss anemia  -Secondary to GI bleed  -Baseline hemoglobin approximately 11  -Presenting hemoglobin 9.7  -Drop in hemoglobin in part due to dilution  -Transfuse 2 units PRBC   Acute GI bleed  -[facility] has been consulted  She was taken off of  Eliquis (recent diagnosis of PAF) and ASA, but left on Plavix given recent stenting of left femoral artery  -No further hematochezia since admission  -Deferred to GI as to optimal time to restart anticoagulation  Peripheral arterial disease  - Pt is s/p numerous bilateral procedures and has chronic foot ischemia  - Per notes, GI and vascular surgery have agreed that Plavix will be continued given recent stenting  - Feet are warm and pink on admission with pulses difficult to palpate b/l  -09/24/2016 left femoral-popliteal bypass redo  -09/26/2016-Right common femoral endarterectomy with bovine pericardial patch angioplasty; Right lower extremity thromboembolectomy  Paroxysmal atrial fibrillation -Presently in sinus rhythm -CHADSVASc = 7 (HTN, Age, TIA, PAD, Female) -Apixiban on hold in the setting of acute GI bleed  Hypertension -Controlled -Continue metoprolol tartrate -Valsartan/HCTZ on hold secondary to volume depletion  Anxiety -Stable -Continue home dose diazepam   Disposition Plan:   Home in 1-2 days  Family Communication:   Son updated at bedside--Total time spent 35 minutes.  Greater than 50% spent face to face counseling and coordinating care.  Consultants:  [facility]  Code Status:  FULL / DNR  DVT Prophylaxis: No pharmacologic ppx d/t active bleed; no SCD d/t severe PAD with chronic foot ischemia; was on Eliquis, ASA, and Plavix as recently as 10/22/16 and is continued on Plavix now   Procedures: As Listed in Progress Note Above  Antibiotics: None    Subjective: Patient denies fevers, chills, headache, chest pain, dyspnea, nausea, vomiting, diarrhea, abdominal pain, dysuria, hematuria, hematochezia, and melena.  Complains of chronic bilateral leg pain unchanged    Objective: Vitals:   10/23/16 1827 10/23/16 1832 10/23/16 2120 10/24/16 0633  BP:  (!) 138/51 Marland Kitchen)  125/46 (!) 125/47  Pulse:  70 82 75  Resp:  16 16 16   Temp:  98.8 F (37.1 C) 98.7 F (37.1 C)  98.1 F (36.7 C)  TempSrc:  Oral Oral Oral  SpO2:  99% 99% 97%  Weight: 60.8 kg (134 lb)   61 kg (134 lb 6.4 oz)  Height: 5\' 2"  (1.575 m)       Intake/Output Summary (Last 24 hours) at 10/24/16 0816 Last data filed at 10/24/16 0659  Gross per 24 hour  Intake             1060 ml  Output                0 ml  Net             1060 ml   Weight change:  Exam:   General:  Pt is alert, follows commands appropriately, not in acute distress  HEENT: No icterus, No thrush, No neck mass, [place]/AT  Cardiovascular: RRR, S1/S2, no rubs, no gallops  Respiratory: Bibasilar crackles. Good air movement. No wheezing.   Abdomen: Soft/+BS, non tender, non distended, no guarding  Extremities: trace LE edema, No lymphangitis, No petechiae, No rashes, no synovitis   Data Reviewed: I have personally reviewed following labs and imaging studies Basic Metabolic Panel:  Recent Labs Lab 10/23/16 1145  NA 135  K 4.1  CL 103  CO2 23  GLUCOSE 110*  BUN 38*  CREATININE 1.09*  CALCIUM 9.4   Liver Function Tests:  Recent Labs Lab 10/23/16 2051  AST 20  ALT 10*  ALKPHOS 56  BILITOT 0.3  PROT 5.4*  ALBUMIN 2.9*   No results for input(s): LIPASE, AMYLASE in the last 168 hours. No results for input(s): AMMONIA in the last 168 hours. Coagulation Profile:  Recent Labs Lab 10/23/16 1145  INR 1.00   CBC:  Recent Labs Lab 10/23/16 1145 10/24/16 0034  WBC 8.4  --   NEUTROABS 5.3  --   HGB 9.7* 7.2*  HCT 30.9* 21.9*  MCV 93.6  --   PLT 222  --    Cardiac Enzymes: No results for input(s): CKTOTAL, CKMB, CKMBINDEX, TROPONINI in the last 168 hours. BNP: Invalid input(s): POCBNP CBG: No results for input(s): GLUCAP in the last 168 hours. HbA1C: No results for input(s): HGBA1C in the last 72 hours. Urine analysis:    Component Value Date/Time   COLORURINE YELLOW 09/04/2013 0130   APPEARANCEUR CLEAR 09/04/2013 0130   LABSPEC 1.014 09/04/2013 0130   PHURINE 5.0 09/04/2013 0130    GLUCOSEU NEGATIVE 09/04/2013 0130   HGBUR NEGATIVE 09/04/2013 0130   BILIRUBINUR NEGATIVE 09/04/2013 0130   KETONESUR NEGATIVE 09/04/2013 0130   PROTEINUR NEGATIVE 09/04/2013 0130   UROBILINOGEN 0.2 09/04/2013 0130   NITRITE NEGATIVE 09/04/2013 0130   LEUKOCYTESUR TRACE (A) 09/04/2013 0130   Sepsis Labs: @LABRCNTIP (procalcitonin:4,lacticidven:4) )No results found for this or any previous visit (from the past 240 hour(s)).   Scheduled Meds: . cholecalciferol  2,000 Units Oral QHS  . clopidogrel  75 mg Oral Daily  . gabapentin  600 mg Oral BID  . metoprolol tartrate  25 mg Oral QHS   And  . metoprolol tartrate  50 mg Oral Daily  . multivitamin  1 tablet Oral QHS  . sodium chloride flush  3 mL Intravenous Q12H   Continuous Infusions: . pantoprozole (PROTONIX) infusion 8 mg/hr (10/24/16 0109)    Procedures/Studies: Dg Chest Port 1 View  Result Date: 09/26/2016 CLINICAL DATA:  Central  line placement. EXAM: PORTABLE CHEST 1 VIEW COMPARISON:  09/04/2013 FINDINGS: Right internal jugular central line has its tip at the SVC RA junction. Heart size is normal. There is aortic atherosclerosis. The pulmonary vascularity is normal. Lungs are clear. No pneumothorax. IMPRESSION: Central line well position with its tip at the SVC RA junction. No pneumothorax. Lungs clear. Aortic atherosclerosis. Electronically Signed   By: Nelson Chimes M.D.   On: 09/26/2016 20:12    Mikhaela Zaugg, DO  [facility] Pager [phone]  If 7PM-7AM, please contact night-coverage [email or web address] Password [email or web address] 10/24/2016, 8:16 AM   LOS: 0 days

## 2016-10-24 NOTE — Consult Note (Signed)
[facility] Consultation Note  Referring Provider: Dr. Shanon Brow [facility]) Primary Care Physician:  Henrine Screws, MD Primary Gastroenterologist:  Dr. Earle Gell  Reason for Consultation:  Anemia, blood in stool  HPI: [name] is a 82 y.o. female with history of TIA, stroke and peripheral vascular disease.  On Plavix for years.  Recent vascular bypass surgery and stent placement, for which aspirin and apixaban were added to her medication regimen.  Over the past 24 hours, patient has had several episodes of black stools and hematochezia.  Has chronic diarrhea (attributed to irritable bowel syndrome) and chronic lower abdominal crampy discomfort, which patient is currently still having.  No nausea, vomiting, hematemesis.  No change in bowel habits.  She has lost some weight over the past couple weeks during her recent hospitalization. Does not appear to have been on PPI at time of admission.  No prior endoscopy.  Colonoscopy by Dr. Wynetta Emery many years ago, unclear results.  Recent interval several-gram drop in Hgb now compared to her recent hospital discharge.     Past Medical History:  Diagnosis Date  . Anemia   . Anginal pain ([place])   . Arthritis    "qwhere" (09/05/2016)  . Chronic lower back pain   . Complication of anesthesia    "takes a long time for it to wear off; I can hallucinate if I take too much" (09/05/2016)  . DVT (deep venous thrombosis) ([place]) 10/2009  . Fall from steps 08/31/2013   Fx. pelvis, Left Hip, Left Elbow  . Fibromyalgia   . GERD (gastroesophageal reflux disease)    09/22/16- "no too much anymore"  . GI bleed 10/24/2016  . High cholesterol   . History of hiatal hernia   . Hypertension   . Macular degeneration, wet ([place])    "started in right eye; now legally blind in that eye; now started in left eye but pretty much in control" (09/05/2016)  . Osteoporosis   . Peripheral vascular disease ([place])   . Squamous cell carcinoma of skin of right  calf Aug. 2015  . Stroke ([place])    TIA's no residual  . TIA (transient ischemic attack)    "several at once; none in a long time" (09/05/2016)    Past Surgical History:  Procedure Laterality Date  . ABDOMINAL AORTAGRAM N/A 12/26/2014   Procedure: ABDOMINAL Maxcine Ham;  Surgeon: Serafina Mitchell, MD;  Location: [facility] CATH LAB;  Service: Cardiovascular;  Laterality: N/A;  . AORTOGRAM N/A 09/26/2016   Procedure: AORTOGRAM;  Surgeon: Waynetta Sandy, MD;  Location: [place];  Service: Vascular;  Laterality: N/A;  . CARPAL TUNNEL RELEASE Right   . CATARACT EXTRACTION W/ INTRAOCULAR LENS  IMPLANT, BILATERAL Bilateral   . COLONOSCOPY    . DILATION AND CURETTAGE OF UTERUS    . EYE SURGERY Right    "macular OR"  . FEMORAL ARTERY STENT  12-11-10   Left SFA  . FEMORAL-POPLITEAL BYPASS GRAFT Left 09/24/2016   Procedure: REDO FEMORAL TO POPLITEAL ARTERY BYPASS GRAFT USING 6MM PROPATEN RINGED GORTEX GRAFT;  Surgeon: Serafina Mitchell, MD;  Location: [place];  Service: Vascular;  Laterality: Left;  . INCISION AND DRAINAGE OF WOUND Left 10/25/2009   leg/notes 11/13/2009  . INSERTION OF ILIAC STENT Left 12/26/2014   Procedure: INSERTION OF ILIAC STENT;  Surgeon: Serafina Mitchell, MD;  Location: [facility] CATH LAB;  Service: Cardiovascular;  Laterality: Left;  . INSERTION OF ILIAC STENT Left 09/26/2016   Procedure: SUB INTIMAL INSERTION OF SUPERFICIAL  FEMORAL ARTERY AND BELOW KNEE BYPASS GRAFT;  Surgeon: Waynetta Sandy, MD;  Location: [place];  Service: Vascular;  Laterality: Left;  . JOINT REPLACEMENT     knee  . JOINT REPLACEMENT Left Oct. 17, 2014   Elbow ( pt fell 08-31-13 )  . ORIF SHOULDER FRACTURE Right    "it was crushed"  . PERIPHERAL VASCULAR CATHETERIZATION N/A 10/30/2015   Procedure: Abdominal Aortogram w/Lower Extremity;  Surgeon: Serafina Mitchell, MD;  Location: [place] CV LAB;  Service: Cardiovascular;  Laterality: N/A;  . PERIPHERAL VASCULAR CATHETERIZATION  10/30/2015   Procedure:  Peripheral Vascular Intervention;  Surgeon: Serafina Mitchell, MD;  Location: [place] CV LAB;  Service: Cardiovascular;;  . PERIPHERAL VASCULAR CATHETERIZATION N/A 04/01/2016   Procedure: Abdominal Aortogram w/Lower Extremity;  Surgeon: Serafina Mitchell, MD;  Location: [place] CV LAB;  Service: Cardiovascular;  Laterality: N/A;  . PERIPHERAL VASCULAR CATHETERIZATION Left 04/01/2016   Procedure: Peripheral Vascular Atherectomy;  Surgeon: Serafina Mitchell, MD;  Location: [place] CV LAB;  Service: Cardiovascular;  Laterality: Left;  Superficial femoral artery.  Marland Kitchen PERIPHERAL VASCULAR CATHETERIZATION Right 09/05/2016   "stent"  . PERIPHERAL VASCULAR CATHETERIZATION N/A 09/05/2016   Procedure: Abdominal Aortogram w/Lower Extremity;  Surgeon: Serafina Mitchell, MD;  Location: [place] CV LAB;  Service: Cardiovascular;  Laterality: N/A;  . PERIPHERAL VASCULAR CATHETERIZATION Right 09/05/2016   Procedure: Peripheral Vascular Intervention;  Surgeon: Serafina Mitchell, MD;  Location: [place] CV LAB;  Service: Cardiovascular;  Laterality: Right;  Superficial Femoral  . PERIPHERAL VASCULAR CATHETERIZATION Left 09/09/2016   Procedure: Lower Extremity Angiography;  Surgeon: Serafina Mitchell, MD;  [place] [facility];  Service: Cardiovascular;  Laterality: Left;  . PR VEIN BYPASS GRAFT,AORTO-FEM-POP  09-13-09   Left Fem-pop  . THROMBECTOMY FEMORAL ARTERY Right 09/26/2016   Procedure: Thromboembolectomy Right Lower Extremity, Right Femoral Artery Endarterectomy with Patch Angioplasty; Right Lower Extremity Angiogram ;  Surgeon: Waynetta Sandy, MD;  Location: [place];  Service: Vascular;  Laterality: Right;  . TOTAL ELBOW ARTHROPLASTY Left 09/03/2013   Procedure: LEFT TOTAL ELBOW ARTHROPLASTY;  Surgeon: Roseanne Kaufman, MD;  Location: [place];  Service: Orthopedics;  Laterality: Left;  . TOTAL KNEE ARTHROPLASTY Left 06/2006  . TUBAL LIGATION    . VAGINAL HYSTERECTOMY      Prior to  Admission medications   Medication Sig Start Date End Date Taking? Authorizing Provider  acetaminophen (TYLENOL) 500 MG tablet Take 1,000 mg by mouth 2 (two) times daily as needed for moderate pain or headache. Patient took this medication for her pain.   Yes Historical Provider, MD  Cholecalciferol (VITAMIN D3) 2000 UNITS TABS Take 2,000 Units by mouth at bedtime.    Yes Historical Provider, MD  clopidogrel (PLAVIX) 75 MG tablet Take 75 mg by mouth daily.   Yes Historical Provider, MD  Cyanocobalamin (VITAMIN B-12 PO) Take 1 tablet by mouth daily.   Yes Historical Provider, MD  diazepam (VALIUM) 5 MG tablet Take 2.5 mg by mouth at bedtime as needed for anxiety.    Yes Historical Provider, MD  gabapentin (NEURONTIN) 300 MG capsule Take 2 capsules (600 mg total) by mouth 2 (two) times daily. 10/03/16  Yes Alvia Grove, PA-C  HYDROcodone-acetaminophen (NORCO/VICODIN) 5-325 MG tablet Take 1 tablet by mouth every 6 (six) hours as needed. 10/03/16  Yes Alvia Grove, PA-C  loperamide (IMODIUM) 2 MG capsule Take 2 mg by mouth as needed for diarrhea or loose stools.  Yes Historical Provider, MD  metoprolol (LOPRESSOR) 50 MG tablet Take 1 tablet (50 mg total) by mouth 2 (two) times daily. Take 50mg  by mouth in the morning and take 25mg  by mouth at bedtime. Patient taking differently: Take 25-50 mg by mouth 2 (two) times daily. Take 50mg  by mouth in the morning and take 25mg  by mouth at bedtime. 10/03/16  Yes Alvia Grove, PA-C  Multiple Vitamins-Minerals (EYE VITAMINS PO) Take 1 tablet by mouth at bedtime.    Yes Historical Provider, MD  traMADol (ULTRAM) 50 MG tablet Take 50 mg by mouth at bedtime as needed for moderate pain.    Yes Historical Provider, MD  valsartan-hydrochlorothiazide (DIOVAN-HCT) 160-12.5 MG per tablet Take 0.5 tablets by mouth daily.   Yes Historical Provider, MD    Current Facility-Administered Medications  Medication Dose Route Frequency Provider Last Rate Last Dose  .  acetaminophen (TYLENOL) tablet 1,000 mg  1,000 mg Oral Q6H PRN Deno Etienne, DO   1,000 mg at 10/23/16 2119  . cholecalciferol (VITAMIN D) tablet 2,000 Units  2,000 Units Oral QHS Vianne Bulls, MD   2,000 Units at 10/23/16 2119  . clopidogrel (PLAVIX) tablet 75 mg  75 mg Oral Daily Vianne Bulls, MD   75 mg at 10/24/16 0949  . diazepam (VALIUM) tablet 2.5 mg  2.5 mg Oral QHS PRN Vianne Bulls, MD      . gabapentin (NEURONTIN) capsule 600 mg  600 mg Oral BID Vianne Bulls, MD   600 mg at 10/24/16 0948  . HYDROcodone-acetaminophen (NORCO/VICODIN) 5-325 MG per tablet 1-2 tablet  1-2 tablet Oral Q6H PRN Vianne Bulls, MD   1 tablet at 10/24/16 0122  . metoprolol tartrate (LOPRESSOR) tablet 25 mg  25 mg Oral QHS Ilene Qua Opyd, MD   25 mg at 10/23/16 2121   And  . metoprolol (LOPRESSOR) tablet 50 mg  50 mg Oral Daily Vianne Bulls, MD   50 mg at 10/24/16 0951  . multivitamin (PROSIGHT) tablet 1 tablet  1 tablet Oral QHS Vianne Bulls, MD   1 tablet at 10/23/16 2241  . ondansetron (ZOFRAN) tablet 4 mg  4 mg Oral Q6H PRN Vianne Bulls, MD       Or  . ondansetron (ZOFRAN) injection 4 mg  4 mg Intravenous Q6H PRN Vianne Bulls, MD      . pantoprazole (PROTONIX) 80 mg in sodium chloride 0.9 % 250 mL (0.32 mg/mL) infusion  8 mg/hr Intravenous Continuous Deno Etienne, DO 25 mL/hr at 10/24/16 0109 8 mg/hr at 10/24/16 0109  . sodium chloride flush (NS) 0.9 % injection 3 mL  3 mL Intravenous Q12H Vianne Bulls, MD   3 mL at 10/24/16 [id number]    Allergies as of 10/23/2016 - Review Complete 10/23/2016  Allergen Reaction Noted  . Motrin [ibuprofen] Other (See Comments) 03/20/2016  . Morphine and related Other (See Comments) 09/22/2016  . Oxycontin [oxycodone hcl] Other (See Comments) 09/22/2016  . Statins Other (See Comments) 03/20/2016  . Promethazine hcl Other (See Comments) 01/15/2012  . Sulfa antibiotics Nausea And Vomiting 01/15/2012    Family History  Problem Relation Age of Onset  . Heart disease  Father     Heart Disease before age 16  . Hyperlipidemia Father   . Hypertension Father   . Alcohol abuse Father   . Heart disease Brother   . Hyperlipidemia Brother   . Hypertension Brother   . Deep vein thrombosis Brother   .  Heart disease Son     Heart Disease before age 86  . Hyperlipidemia Son   . Hypertension Son   . Heart attack Son   . Diabetes Son   . Hypertension Son   . Hyperlipidemia Sister   . Hypertension Sister     Social History   Social History  . Marital status: Married    Spouse name: N/A  . Number of children: N/A  . Years of education: N/A   Occupational History  . Not on file.   Social History Main Topics  . Smoking status: Former Smoker    Types: Cigarettes    Quit date: 11/17/1946  . Smokeless tobacco: Never Used     Comment: "never smoked much"  . Alcohol use No  . Drug use: No  . Sexual activity: Not on file   Other Topics Concern  . Not on file   Social History Narrative  . No narrative on file    Review of Systems: Positive = bold Gen: Denies any fever, chills, rigors, night sweats, anorexia, fatigue, weakness, malaise, involuntary weight loss, and sleep disorder CV: Denies chest pain, angina, palpitations, syncope, orthopnea, PND, peripheral edema, and claudication. Resp: Denies dyspnea, cough, sputum, wheezing, coughing up blood. GI: Described in detail in HPI.    GU : Denies urinary burning, blood in urine, urinary frequency, urinary hesitancy, nocturnal urination, and urinary incontinence. MS: Denies joint pain or swelling.  Denies muscle weakness, cramps, atrophy.  Derm: Denies rash, itching, oral ulcerations, hives, unhealing ulcers.  Psych: Denies depression, anxiety, memory loss, suicidal ideation, hallucinations,  and confusion. Heme: Denies bruising, bleeding, and enlarged lymph nodes. Neuro:  Denies any headaches, dizziness, paresthesias. Endo:  Denies any problems with DM, thyroid, adrenal function.  Physical  Exam: Vital signs in last 24 hours: Temp:  [97.7 F (36.5 C)-98.8 F (37.1 C)] 98.1 F (36.7 C) (12/08 [id number]) Pulse Rate:  [60-82] 74 (12/08 0951) Resp:  [16-20] 16 (12/08 0633) BP: (125-140)/(46-78) 137/54 (12/08 0951) SpO2:  [97 %-100 %] 97 % (12/08 [id number]) Weight:  [55.8 kg (123 lb)-61 kg (134 lb 6.4 oz)] 61 kg (134 lb 6.4 oz) (12/08 [id number]) Last BM Date: 10/23/16 General:   Alert, elderly, Well-developed, well-nourished, pleasant and cooperative in NAD Head:  Normocephalic and atraumatic. Eyes:  Sclera clear, no icterus.   Conjunctiva pale Ears:  Normal auditory acuity. Nose:  No deformity, discharge,  or lesions. Mouth:  No deformity or lesions.  Oropharynx pale and dry Neck:  Supple; no masses or thyromegaly. Lungs:  Clear throughout to auscultation.   No wheezes, crackles, or rhonchi. No acute distress. Heart:  Regular rate and rhythm; no murmurs, clicks, rubs,  or gallops. Abdomen:  Soft, protuberant, mild lower abdominal tenderness without peritonitis, nondistended. No masses, hepatosplenomegaly or hernias noted. Normal bowel sounds, without guarding, and without rebound.     Msk:  Symmetrical without gross deformities. Normal posture. Pulses:  Normal pulses noted. Extremities:  Without clubbing or edema.  Extensive lower extremity varicosities bilaterally.  Right medial leg surgical incision from recent intervention Neurologic:  Alert and  oriented x4; diffusely weak, otherwise grossly normal neurologically. Skin:  Pale; scattered ecchymoses, otherwise iIntact without significant lesions or rashes. Psych:  Alert and cooperative. Normal mood and affect.   Lab Results:  Recent Labs  10/23/16 1145 10/24/16 0034  WBC 8.4  --   HGB 9.7* 7.2*  HCT 30.9* 21.9*  PLT 222  --    BMET  Recent Labs  10/23/16 1145  NA 135  K 4.1  CL 103  CO2 23  GLUCOSE 110*  BUN 38*  CREATININE 1.09*  CALCIUM 9.4   LFT  Recent Labs  10/23/16 2051  PROT 5.4*  ALBUMIN 2.9*  AST 20   ALT 10*  ALKPHOS 56  BILITOT 0.3  BILIDIR <0.1*  IBILI NOT CALCULATED   PT/INR  Recent Labs  10/23/16 1145  LABPROT 13.2  INR 1.00    Studies/Results: No results found.  Impression:  1.  Melena and hematochezia, in setting of extensive anticoagulation.  Quiescent over the past 12 hours but with interval drop in Hgb.  In conjunction with elevated BUN, suspect upper GI tract source, most likely extensive mucosal friability given multiple anticoagulants, but focal lesion (such as ulcer, AVM, etc.) can't be definitively excluded. 2.  Anemia, likely acute on chronic, with interval drop. 3.  History of stroke and TIA, on apixaban. 4.  History peripheral vascular disease, with recent vascular bypass and stenting surgery.  Plan:  1.  Patient is off apixaban and aspirin; remains on clopidigrel given very recent vascular stent placement. 2.  PPI infusion. 3.  Clear liquid diet ok, advance to full liquids as tolerated. 4.  Follow serial CBCs. 5.  Continue intravenous volume repletion. 6.  I had extensive discussion with patient and her daughter (who is a preoperative nurse at one of the local surgical centers) about consideration of endoscopy.  Endoscopy would help clarify potential source of bleeding, but no interventional endoscopic therapy could be done, since patient must remain on clopidigrel.  Another option would be intensive PPI therapy, downscaling anticoagulants for the foreseeable future (clopidigrel alone, as is currently being done), and see how things go without doing endoscopy. 7.  Patient and daughter will think things over, see how Hgb does over next 24 hours, and see if any further bleeding ensues.  Pending these considerations, we can help better clarify risks and benefits and desire for endoscopy over the next few days.  There is certainly no acute destabilizing bleeding at the present time which needs urgent intervention. 8.  [facility] will follow.   LOS: 0 days    Curt Oatis M  10/24/2016, 11:19 AM  Pager [phone] If no answer or after 5 PM call [phone]

## 2016-10-25 LAB — CBC
HEMATOCRIT: 31.4 % — AB (ref 36.0–46.0)
HEMOGLOBIN: 10.6 g/dL — AB (ref 12.0–15.0)
MCH: 29.6 pg (ref 26.0–34.0)
MCHC: 33.8 g/dL (ref 30.0–36.0)
MCV: 87.7 fL (ref 78.0–100.0)
Platelets: 136 10*3/uL — ABNORMAL LOW (ref 150–400)
RBC: 3.58 MIL/uL — AB (ref 3.87–5.11)
RDW: 19.2 % — ABNORMAL HIGH (ref 11.5–15.5)
WBC: 6.7 10*3/uL (ref 4.0–10.5)

## 2016-10-25 LAB — BASIC METABOLIC PANEL
ANION GAP: 7 (ref 5–15)
BUN: 21 mg/dL — ABNORMAL HIGH (ref 6–20)
CHLORIDE: 112 mmol/L — AB (ref 101–111)
CO2: 20 mmol/L — AB (ref 22–32)
CREATININE: 1.09 mg/dL — AB (ref 0.44–1.00)
Calcium: 8.8 mg/dL — ABNORMAL LOW (ref 8.9–10.3)
GFR calc non Af Amer: 45 mL/min — ABNORMAL LOW (ref >60)
GFR, EST AFRICAN AMERICAN: 52 mL/min — AB (ref >60)
Glucose, Bld: 98 mg/dL (ref 65–99)
POTASSIUM: 4.3 mmol/L (ref 3.5–5.1)
Sodium: 139 mmol/L (ref 135–145)

## 2016-10-25 MED ORDER — IRBESARTAN 75 MG PO TABS
75.0000 mg | ORAL_TABLET | Freq: Every day | ORAL | Status: DC
  Administered 2016-10-25 – 2016-10-26 (×2): 75 mg via ORAL
  Filled 2016-10-25 (×2): qty 1

## 2016-10-25 MED ORDER — PANTOPRAZOLE SODIUM 40 MG PO TBEC: 40.0000 mg | DELAYED_RELEASE_TABLET | Freq: Every day | ORAL | 1 refills | Status: DC

## 2016-10-25 MED ORDER — PANTOPRAZOLE SODIUM 40 MG IV SOLR
40.0000 mg | Freq: Two times a day (BID) | INTRAVENOUS | Status: DC
  Administered 2016-10-25 – 2016-10-26 (×3): 40 mg via INTRAVENOUS
  Filled 2016-10-25 (×3): qty 40

## 2016-10-25 NOTE — Discharge Summary (Signed)
Physician Discharge Summary  [name] [id number] DOB: 12-Jul-1928 DOA: 10/23/2016  PCP: Henrine Screws, MD  Admit date: 10/23/2016 Discharge date: 10/26/16  Admitted From: Home Disposition:  Home  Recommendations for Outpatient Follow-up:  1. Follow up with PCP in 1-2 weeks 2. Please obtain CBC in one week 3. Follow up with [name] on 10/27/16 4. Follow up with [facility] in 3-4 weeks   Home Health:No Equipment/Devices:none  Discharge Condition: Stable CODE STATUS: FULL Diet recommendation: Heart Healthy  Brief/Interim Summary: 82 year old female with a history of hypertension, paroxysmal atrial fibrillation, peripheral arterial occlusive disease with chronic ischemia of the bilateral lower extremities status post numerous interventions on her most recent admission presented with hematochezia. The patient was recently discharged from the hospital after admission from 09/24/2016 through 10/10/2016 during which she had numerous vascular interventions including a left femoral-popliteal bypass redo. This was complicated by an occlusion of the bypass which ultimately required open exploration with endarterectomy and thrombectomy with placement of the stent in the left femoral artery. During that admission, the patient developed paroxysmal atrial fibrillation and was discharged home on apixiban in addition to her aspirin and Plavix. On 10/22/2016, the patient had dark stools at home. She went to see her primary care provider. During her office visit, the patient had an episode of hematochezia. After discussion with vascular surgery and gastroenterology, aspirin and apixiban were discontinued, but the patient was continued on Plavix secondary to her recent stent. She was directed to come to the emergency department that day, but she did not present to the emergency department until 10/23/2016. Hemoglobin was reported to be 10.6 at the PCP office. Hemoglobin was 9.7 in the  emergency department. Since admission, the patient has not had any further episodes of hematochezia. She was started on a Protonix drip, and GI was consulted.   Discharge Diagnoses:  acute blood loss anemia  -Secondary to GI bleed  -Baseline hemoglobin approximately 11  -Presenting hemoglobin 9.7  -Drop in hemoglobin in part due to dilution  -10/24/16 Transfuse 2 units PRBC  -cbc in am--Hgb stable after transfusion--Hgb 10.1 on day of d/c  Acute GI bleed  -[facility] has been consulted  She was taken off of Eliquis (recent diagnosis of PAF) and ASA, but left on Plavix given recent stenting of left femoral artery  -No further hematochezia since admission  -Deferred to GI as to optimal time to restart anticoagulation -discussed with pt risks/benefits of restarting AC--she is not willing to accept risks of rebleeding -continue plavix monotherapy as discussed above -continue PPI per GI recommendations after d/c -advance diet  Peripheral arterial disease  - Pt is s/p numerous bilateral procedures and has chronic foot ischemia  - Per notes, GI and vascular surgery have agreed that Plavix will be continued given recent stenting  - Feet are warm and pink on admission with pulses difficult to palpate b/l  -09/24/2016 left femoral-popliteal bypass redo  -09/26/2016-Right common femoral endarterectomy with bovine pericardial patch angioplasty; Right lower extremity thromboembolectomy  Paroxysmal atrial fibrillation -Presently in sinus rhythm -CHADSVASc = 7 (HTN, Age, TIA, PAD, Female) -Apixiban on hold in the setting of acute GI bleed  Hypertension -Controlled -Continue metoprolol tartrate--pt states she takes 50 mg BID -restart ARB -hold HCTZ during hospitalization -restart home dose valsartan/HCTZ after d/c  Anxiety -Stable -Continue home dose diazepam   Discharge Instructions  Discharge Instructions    Diet - low sodium heart healthy    Complete by:  As directed     Increase  activity slowly    Complete by:  As directed        Medication List    TAKE these medications   acetaminophen 500 MG tablet Commonly known as:  TYLENOL Take 1,000 mg by mouth 2 (two) times daily as needed for moderate pain or headache. Patient took this medication for her pain.   clopidogrel 75 MG tablet Commonly known as:  PLAVIX Take 75 mg by mouth daily.   diazepam 5 MG tablet Commonly known as:  VALIUM Take 2.5 mg by mouth at bedtime as needed for anxiety.   EYE VITAMINS PO Take 1 tablet by mouth at bedtime.   gabapentin 300 MG capsule Commonly known as:  NEURONTIN Take 2 capsules (600 mg total) by mouth 2 (two) times daily.   HYDROcodone-acetaminophen 5-325 MG tablet Commonly known as:  NORCO/VICODIN Take 1 tablet by mouth every 6 (six) hours as needed.   loperamide 2 MG capsule Commonly known as:  IMODIUM Take 2 mg by mouth as needed for diarrhea or loose stools.   metoprolol 50 MG tablet Commonly known as:  LOPRESSOR Take 1 tablet (50 mg total) by mouth 2 (two) times daily. Take 50mg  by mouth in the morning and take 25mg  by mouth at bedtime. What changed:  how much to take  additional instructions   pantoprazole 40 MG tablet Commonly known as:  PROTONIX Take 1 tablet (40 mg total) by mouth daily.   traMADol 50 MG tablet Commonly known as:  ULTRAM Take 50 mg by mouth at bedtime as needed for moderate pain.   valsartan-hydrochlorothiazide 160-12.5 MG tablet Commonly known as:  DIOVAN-HCT Take 0.5 tablets by mouth daily.   VITAMIN B-12 PO Take 1 tablet by mouth daily.   Vitamin D3 2000 units Tabs Take 2,000 Units by mouth at bedtime.       Allergies  Allergen Reactions  . Motrin [Ibuprofen] Other (See Comments)    ADVERSE REACTION - GI BLEED  . Morphine And Related Other (See Comments)    HALLUCINATIONS REACTION IS SIDE EFFECT  . Oxycontin [Oxycodone Hcl] Other (See Comments)    [REACTION IS SIDE EFFECT]  Hallinatations  . Statins  Other (See Comments)    ADVERSE REACTION MUSCLE PAIN & WEAKNESS  . Promethazine Hcl Other (See Comments)    IV  Drug only, makes her act crazy  . Sulfa Antibiotics Nausea And Vomiting    Consultations:  [facility]   Procedures/Studies: Dg Chest Port 1 View  Result Date: 09/26/2016 CLINICAL DATA:  Central line placement. EXAM: PORTABLE CHEST 1 VIEW COMPARISON:  09/04/2013 FINDINGS: Right internal jugular central line has its tip at the SVC RA junction. Heart size is normal. There is aortic atherosclerosis. The pulmonary vascularity is normal. Lungs are clear. No pneumothorax. IMPRESSION: Central line well position with its tip at the SVC RA junction. No pneumothorax. Lungs clear. Aortic atherosclerosis. Electronically Signed   By: Nelson Chimes M.D.   On: 09/26/2016 20:12        Discharge Exam: Vitals:   10/25/16 2123 10/26/16 0555  BP: (!) 150/54 (!) 146/57  Pulse: 79 69  Resp: 18 18  Temp: 98.7 F (37.1 C) 98.2 F (36.8 C)   Vitals:   10/25/16 0555 10/25/16 1614 10/25/16 2123 10/26/16 0555  BP: (!) 164/55 (!) 136/44 (!) 150/54 (!) 146/57  Pulse: 72 64 79 69  Resp: 18 18 18 18   Temp: 98 F (36.7 C) 97.9 F (36.6 C) 98.7 F (37.1 C) 98.2 F (36.8 C)  TempSrc: Oral Oral Oral Oral  SpO2: 95%   94%  Weight:    59.6 kg (131 lb 8 oz)  Height:        General: Pt is alert, awake, not in acute distress Cardiovascular: RRR, S1/S2 +, no rubs, no gallops Respiratory: CTA bilaterally, no wheezing, no rhonchi Abdominal: Soft, NT, ND, bowel sounds + Extremities: trace LE edema, no cyanosis; extensive LE varicosities   The results of significant diagnostics from this hospitalization (including imaging, microbiology, ancillary and laboratory) are listed below for reference.    Significant Diagnostic Studies: Dg Chest Port 1 View  Result Date: 09/26/2016 CLINICAL DATA:  Central line placement. EXAM: PORTABLE CHEST 1 VIEW COMPARISON:  09/04/2013 FINDINGS: Right internal  jugular central line has its tip at the SVC RA junction. Heart size is normal. There is aortic atherosclerosis. The pulmonary vascularity is normal. Lungs are clear. No pneumothorax. IMPRESSION: Central line well position with its tip at the SVC RA junction. No pneumothorax. Lungs clear. Aortic atherosclerosis. Electronically Signed   By: Nelson Chimes M.D.   On: 09/26/2016 20:12     Microbiology: No results found for this or any previous visit (from the past 240 hour(s)).   Labs: Basic Metabolic Panel:  Recent Labs Lab 10/23/16 1145 10/25/16 0542  NA 135 139  K 4.1 4.3  CL 103 112*  CO2 23 20*  GLUCOSE 110* 98  BUN 38* 21*  CREATININE 1.09* 1.09*  CALCIUM 9.4 8.8*   Liver Function Tests:  Recent Labs Lab 10/23/16 2051  AST 20  ALT 10*  ALKPHOS 56  BILITOT 0.3  PROT 5.4*  ALBUMIN 2.9*   No results for input(s): LIPASE, AMYLASE in the last 168 hours. No results for input(s): AMMONIA in the last 168 hours. CBC:  Recent Labs Lab 10/23/16 1145 10/24/16 0034 10/25/16 0542 10/26/16 0501  WBC 8.4  --  6.7 8.3  NEUTROABS 5.3  --   --   --   HGB 9.7* 7.2* 10.6* 10.1*  HCT 30.9* 21.9* 31.4* 30.9*  MCV 93.6  --  87.7 88.0  PLT 222  --  136* 136*   Cardiac Enzymes: No results for input(s): CKTOTAL, CKMB, CKMBINDEX, TROPONINI in the last 168 hours. BNP: Invalid input(s): POCBNP CBG: No results for input(s): GLUCAP in the last 168 hours.  Time coordinating discharge:  Greater than 30 minutes  Signed:  Adelaide Pfefferkorn, DO [facility] Pager: [phone] 10/26/2016, 9:48 AM

## 2016-10-25 NOTE — Progress Notes (Signed)
2nd unit of blood started in 22 rw  Ns infused 62ml  - 2 nurse verified . At pt bedside for 15 min.  Vs stable . Pt tolerating well , no sign of transfusion reaction . Pt daughter at bedside and  Both pt and daughter was informed of s/s of transfusion reaction . They verbalized understanding

## 2016-10-25 NOTE — Progress Notes (Signed)
PROGRESS NOTE  [name] [id number] DOB: 11-04-1928 DOA: 10/23/2016 PCP: Henrine Screws, MD  Brief History:  82 year old female with a history of hypertension, paroxysmal atrial fibrillation, peripheral arterial occlusive disease with chronic ischemia of the bilateral lower extremities status post numerous interventions on her most recent admission presented with hematochezia. The patient was recently discharged from the hospital after admission from 09/24/2016 through 10/10/2016 during which she had numerous vascular interventions including a left femoral-popliteal bypass redo. This was complicated by an occlusion of the bypass which ultimately required open exploration with endarterectomy and thrombectomy with placement of the stent in the left femoral artery. During that admission, the patient developed paroxysmal atrial fibrillation and was discharged home on apixiban in addition to her aspirin and Plavix. On 10/22/2016, the patient had dark stools at home. She went to see her primary care provider. During her office visit, the patient had an episode of hematochezia. After discussion with vascular surgery and gastroenterology, aspirin and apixiban were discontinued, but the patient was continued on Plavix secondary to her recent stent. She was directed to come to the emergency department that day, but she did not present to the emergency department until 10/23/2016. Hemoglobin was reported to be 10.6 at the PCP office. Hemoglobin was 9.7 in the emergency department. Since admission, the patient has not had any further episodes of hematochezia. She was started on a Protonix drip, and GI was consulted.   Assessment/Plan:  acute blood loss anemia  -Secondary to GI bleed  -Baseline hemoglobin approximately 11  -Presenting hemoglobin 9.7  -Drop in hemoglobin in part due to dilution  -10/24/16 Transfuse 2 units PRBC  -cbc in am  Acute GI bleed  -[facility] has been consulted   She was taken off of Eliquis (recent diagnosis of PAF) and ASA, but left on Plavix given recent stenting of left femoral artery  -No further hematochezia since admission  -Deferred to GI as to optimal time to restart anticoagulation -discussed with pt risks/benefits of restarting AC--she is not willing to accept risks of rebleeding -continue plavix monotherapy as discussed above -continue PPI per GI -advance diet  Peripheral arterial disease  - Pt is s/p numerous bilateral procedures and has chronic foot ischemia  - Per notes, GI and vascular surgery have agreed that Plavix will be continued given recent stenting  - Feet are warm and pink on admission with pulses difficult to palpate b/l  -09/24/2016 left femoral-popliteal bypass redo  -09/26/2016-Right common femoral endarterectomy with bovine pericardial patch angioplasty; Right lower extremity thromboembolectomy  Paroxysmal atrial fibrillation -Presently in sinus rhythm -CHADSVASc = 7 (HTN, Age, TIA, PAD, Female) -Apixiban on hold in the setting of acute GI bleed  Hypertension -Controlled -Continue metoprolol tartrate -restart ARB -hold HCTZ  Anxiety -Stable -Continue home dose diazepam   Disposition Plan:   Home 12/10 if stable Family Communication:  no family at bedside  Consultants:  [facility]  Code Status:  FULL   DVT Prophylaxis: No pharmacologic ppx d/t active bleed; no SCD d/t severe PAD with chronic foot ischemia; was on Eliquis, ASA, and Plavix as recently as 10/22/16 and is continued on Plavix now   Procedures: As Listed in Progress Note Above  Antibiotics: None    Subjective: Patient has had 3 soft brown stools without frank watery stools or hematochezia or melena. Denies any fevers, chills, chest pain, dizziness, short of breath, nausea, vomiting, diarrhea, abdominal pain. No dysuria or hematuria.  Objective:  Vitals:   10/25/16 0042 10/25/16 0057 10/25/16 0304 10/25/16 0555  BP:  (!)  140/52 (!) 148/61 (!) 164/55  Pulse:  67 65 72  Resp:  18 18 18   Temp:  98.5 F (36.9 C) 98.7 F (37.1 C) 98 F (36.7 C)  TempSrc:  Oral Oral Oral  SpO2: 100% 98% 97% 95%  Weight:      Height:        Intake/Output Summary (Last 24 hours) at 10/25/16 1230 Last data filed at 10/25/16 0557  Gross per 24 hour  Intake          2099.16 ml  Output             1350 ml  Net           749.16 ml   Weight change:  Exam:   General:  Pt is alert, follows commands appropriately, not in acute distress  HEENT: No icterus, No thrush, No neck mass, [place]/AT  Cardiovascular: RRR, S1/S2, no rubs, no gallops  Respiratory: Diminished breath sounds without wheezing. Good air movement.  Abdomen: Soft/+BS, non tender, non distended, no guarding  Extremities: trace LE edema, No lymphangitis, No petechiae, No rashes, no synovitis   Data Reviewed: I have personally reviewed following labs and imaging studies Basic Metabolic Panel:  Recent Labs Lab 10/23/16 1145 10/25/16 0542  NA 135 139  K 4.1 4.3  CL 103 112*  CO2 23 20*  GLUCOSE 110* 98  BUN 38* 21*  CREATININE 1.09* 1.09*  CALCIUM 9.4 8.8*   Liver Function Tests:  Recent Labs Lab 10/23/16 2051  AST 20  ALT 10*  ALKPHOS 56  BILITOT 0.3  PROT 5.4*  ALBUMIN 2.9*   No results for input(s): LIPASE, AMYLASE in the last 168 hours. No results for input(s): AMMONIA in the last 168 hours. Coagulation Profile:  Recent Labs Lab 10/23/16 1145  INR 1.00   CBC:  Recent Labs Lab 10/23/16 1145 10/24/16 0034 10/25/16 0542  WBC 8.4  --  6.7  NEUTROABS 5.3  --   --   HGB 9.7* 7.2* 10.6*  HCT 30.9* 21.9* 31.4*  MCV 93.6  --  87.7  PLT 222  --  136*   Cardiac Enzymes: No results for input(s): CKTOTAL, CKMB, CKMBINDEX, TROPONINI in the last 168 hours. BNP: Invalid input(s): POCBNP CBG: No results for input(s): GLUCAP in the last 168 hours. HbA1C: No results for input(s): HGBA1C in the last 72 hours. Urine analysis:      Component Value Date/Time   COLORURINE YELLOW 09/04/2013 0130   APPEARANCEUR CLEAR 09/04/2013 0130   LABSPEC 1.014 09/04/2013 0130   PHURINE 5.0 09/04/2013 0130   GLUCOSEU NEGATIVE 09/04/2013 0130   HGBUR NEGATIVE 09/04/2013 0130   BILIRUBINUR NEGATIVE 09/04/2013 0130   KETONESUR NEGATIVE 09/04/2013 0130   PROTEINUR NEGATIVE 09/04/2013 0130   UROBILINOGEN 0.2 09/04/2013 0130   NITRITE NEGATIVE 09/04/2013 0130   LEUKOCYTESUR TRACE (A) 09/04/2013 0130   Sepsis Labs: @LABRCNTIP (procalcitonin:4,lacticidven:4) )No results found for this or any previous visit (from the past 240 hour(s)).   Scheduled Meds: . cholecalciferol  2,000 Units Oral QHS  . clopidogrel  75 mg Oral Daily  . gabapentin  600 mg Oral BID  . metoprolol tartrate  25 mg Oral QHS   And  . metoprolol tartrate  50 mg Oral Daily  . multivitamin  1 tablet Oral QHS  . pantoprazole  40 mg Intravenous Q12H  . sodium chloride flush  3 mL Intravenous Q12H  Continuous Infusions:  Procedures/Studies: Dg Chest Port 1 View  Result Date: 09/26/2016 CLINICAL DATA:  Central line placement. EXAM: PORTABLE CHEST 1 VIEW COMPARISON:  09/04/2013 FINDINGS: Right internal jugular central line has its tip at the SVC RA junction. Heart size is normal. There is aortic atherosclerosis. The pulmonary vascularity is normal. Lungs are clear. No pneumothorax. IMPRESSION: Central line well position with its tip at the SVC RA junction. No pneumothorax. Lungs clear. Aortic atherosclerosis. Electronically Signed   By: Nelson Chimes M.D.   On: 09/26/2016 20:12    Kimari Coudriet, DO  [facility] Pager [phone]  If 7PM-7AM, please contact night-coverage [email or web address] Password [email or web address] 10/25/2016, 12:30 PM   LOS: 1 day

## 2016-10-25 NOTE — Progress Notes (Signed)
[facility] Gastroenterology Progress Note  [name] 82 y.o. 04-01-1928  CC:  GI bleed   Subjective: Patient is doing fine. No episode of bleeding. Daughter at bedside. Having brown stools. Denied abdominal pain. Wants to eat.  ROS : Negative for GI bleed, abdominal pain, vomiting   Objective: Vital signs in last 24 hours: Vitals:   10/25/16 0304 10/25/16 0555  BP: (!) 148/61 (!) 164/55  Pulse: 65 72  Resp: 18 18  Temp: 98.7 F (37.1 C) 98 F (36.7 C)    Physical Exam:  General:  Alert, cooperative, no distress, appears stated age  Head:  Normocephalic, without obvious abnormality, atraumatic  Eyes:  , EOM's intact,   Lungs:   Clear to auscultation bilaterally, respirations unlabored  Heart:  Regular rate and rhythm, S1, S2 normal  Abdomen:   Soft, non-tender, bowel sounds active all four quadrants,  no masses,   Extremities: Extremities normal, atraumatic, no  edema       Lab Results:  Recent Labs  10/23/16 1145 10/25/16 0542  NA 135 139  K 4.1 4.3  CL 103 112*  CO2 23 20*  GLUCOSE 110* 98  BUN 38* 21*  CREATININE 1.09* 1.09*  CALCIUM 9.4 8.8*    Recent Labs  10/23/16 2051  AST 20  ALT 10*  ALKPHOS 56  BILITOT 0.3  PROT 5.4*  ALBUMIN 2.9*    Recent Labs  10/23/16 1145 10/24/16 0034 10/25/16 0542  WBC 8.4  --  6.7  NEUTROABS 5.3  --   --   HGB 9.7* 7.2* 10.6*  HCT 30.9* 21.9* 31.4*  MCV 93.6  --  87.7  PLT 222  --  136*    Recent Labs  10/23/16 1145  LABPROT 13.2  INR 1.00      Assessment/Plan: - GI bleed. With melena and hematochezia in setting of multiple anticoagulation and antiplatelet - Peripheral arterial disease status post recent  stent placement. On Plavix - Paroxysmal atrial fibrillation. Was on Eliquis, aspirin along with Plavix. Eliquis on hold - History of TIA  Recommendations --------------------------- - Patient's hemoglobin is stable. Having brown stools. - Advance diet to soft - Change Protonix drip to  IV twice a day. Change to by mouth once a day on discharge - No plan for endoscopic workup. - GI will sign off. Call us back if needed   Otis Brace MD, [place] 10/25/2016, 11:11 AM  Pager [phone]  If no answer or after 5 PM call [phone]

## 2016-10-26 LAB — CBC
HEMATOCRIT: 30.9 % — AB (ref 36.0–46.0)
Hemoglobin: 10.1 g/dL — ABNORMAL LOW (ref 12.0–15.0)
MCH: 28.8 pg (ref 26.0–34.0)
MCHC: 32.7 g/dL (ref 30.0–36.0)
MCV: 88 fL (ref 78.0–100.0)
Platelets: 136 10*3/uL — ABNORMAL LOW (ref 150–400)
RBC: 3.51 MIL/uL — ABNORMAL LOW (ref 3.87–5.11)
RDW: 19.3 % — AB (ref 11.5–15.5)
WBC: 8.3 10*3/uL (ref 4.0–10.5)

## 2016-10-26 LAB — TYPE AND SCREEN
ABO/RH(D): O POS
Antibody Screen: NEGATIVE
UNIT DIVISION: 0
Unit division: 0

## 2016-10-26 MED ORDER — METOPROLOL TARTRATE 50 MG PO TABS
50.0000 mg | ORAL_TABLET | Freq: Two times a day (BID) | ORAL | 0 refills | Status: DC
Start: 2016-10-26 — End: 2017-01-13

## 2016-10-26 NOTE — Discharge Instructions (Signed)
Pantoprazole tablets What is this medicine? PANTOPRAZOLE (pan TOE pra zole) prevents the production of acid in the stomach. It is used to treat gastroesophageal reflux disease (GERD), inflammation of the esophagus, and Zollinger-Ellison syndrome. This medicine may be used for other purposes; ask your health care provider or pharmacist if you have questions. COMMON BRAND NAME(S): Protonix What should I tell my health care provider before I take this medicine? They need to know if you have any of these conditions: -liver disease -low levels of magnesium in the blood -lupus -an unusual or allergic reaction to omeprazole, lansoprazole, pantoprazole, rabeprazole, other medicines, foods, dyes, or preservatives -pregnant or trying to get pregnant -breast-feeding How should I use this medicine? Take this medicine by mouth. Swallow the tablets whole with a drink of water. Follow the directions on the prescription label. Do not crush, break, or chew. Take your medicine at regular intervals. Do not take your medicine more often than directed. Talk to your pediatrician regarding the use of this medicine in children. While this drug may be prescribed for children as [place] as 5 years for selected conditions, precautions do apply. Overdosage: If you think you have taken too much of this medicine contact a poison control center or emergency room at once. NOTE: This medicine is only for you. Do not share this medicine with others. What if I miss a dose? If you miss a dose, take it as soon as you can. If it is almost time for your next dose, take only that dose. Do not take double or extra doses. What may interact with this medicine? Do not take this medicine with any of the following medications: -atazanavir -nelfinavir This medicine may also interact with the following medications: -ampicillin -delavirdine -erlotinib -iron salts -medicines for fungal infections like ketoconazole, itraconazole and  voriconazole -methotrexate -mycophenolate mofetil -warfarin This list may not describe all possible interactions. Give your health care provider a list of all the medicines, herbs, non-prescription drugs, or dietary supplements you use. Also tell them if you smoke, drink alcohol, or use illegal drugs. Some items may interact with your medicine. What should I watch for while using this medicine? It can take several days before your stomach pain gets better. Check with your doctor or health care professional if your condition does not start to get better, or if it gets worse. You may need blood work done while you are taking this medicine. What side effects may I notice from receiving this medicine? Side effects that you should report to your doctor or health care professional as soon as possible: -allergic reactions like skin rash, itching or hives, swelling of the face, lips, or tongue -bone, muscle or joint pain -breathing problems -chest pain or chest tightness -dark yellow or brown urine -dizziness -fast, irregular heartbeat -feeling faint or lightheaded -fever or sore throat -muscle spasm -palpitations -rash on cheeks or arms that gets worse in the sun -redness, blistering, peeling or loosening of the skin, including inside the mouth -seizures -tremors -unusual bleeding or bruising -unusually weak or tired -yellowing of the eyes or skin Side effects that usually do not require medical attention (report to your doctor or health care professional if they continue or are bothersome): -constipation -diarrhea -dry mouth -headache -nausea This list may not describe all possible side effects. Call your doctor for medical advice about side effects. You may report side effects to FDA at 1-800-FDA-1088. Where should I keep my medicine? Keep out of the reach of children. Store at room   temperature between 15 and 30 degrees C (59 and 86 degrees F). Protect from light and moisture. Throw  away any unused medicine after the expiration date. NOTE: This sheet is a summary. It may not cover all possible information. If you have questions about this medicine, talk to your doctor, pharmacist, or health care provider.  2017 Elsevier/Gold Standard (2015-12-06 12:20:19) Gastrointestinal Bleeding Introduction Gastrointestinal bleeding is bleeding somewhere along the path food travels through the body (digestive tract). This path is anywhere between the mouth and the opening of the butt (anus). You may have blood in your poop (stools) or have black poop. If you throw up (vomit), there may be blood in it. This condition can be mild, serious, or even life-threatening. If you have a lot of bleeding, you may need to stay in the hospital. Follow these instructions at home:  Take over-the-counter and prescription medicines only as told by your doctor.  Eat foods that have a lot of fiber in them. These foods include whole grains, fruits, and vegetables. You can also try eating 1-3 prunes each day.  Drink enough fluid to keep your pee (urine) clear or pale yellow.  Keep all follow-up visits as told by your doctor. This is important. Contact a doctor if:  Your symptoms do not get better. Get help right away if:  Your bleeding gets worse.  You feel dizzy or you pass out (faint).  You feel weak.  You have very bad cramps in your back or belly (abdomen).  You pass large clumps of blood (clots) in your poop.  Your symptoms are getting worse. This information is not intended to replace advice given to you by your health care provider. Make sure you discuss any questions you have with your health care provider. Document Released: 08/12/2008 Document Revised: 04/10/2016 Document Reviewed: 04/23/2015  2017 Elsevier

## 2016-10-27 ENCOUNTER — Ambulatory Visit (INDEPENDENT_AMBULATORY_CARE_PROVIDER_SITE_OTHER): Payer: Medicare Other | Admitting: Surgery

## 2016-10-27 ENCOUNTER — Encounter: Payer: Self-pay | Admitting: Surgery

## 2016-10-27 VITALS — BP 170/75 | HR 60 | Temp 97.3°F | Resp 14 | Ht 62.0 in | Wt 127.0 lb

## 2016-10-27 DIAGNOSIS — I70213 Atherosclerosis of native arteries of extremities with intermittent claudication, bilateral legs: Secondary | ICD-10-CM

## 2016-10-27 DIAGNOSIS — I70229 Atherosclerosis of native arteries of extremities with rest pain, unspecified extremity: Secondary | ICD-10-CM

## 2016-10-27 MED ORDER — NITROGLYCERIN 0.2 MG/HR TD PT24: 0.2000 mg | MEDICATED_PATCH | Freq: Every day | TRANSDERMAL | 12 refills | Status: DC

## 2016-10-27 MED ORDER — HYDROCODONE-ACETAMINOPHEN 5-325 MG PO TABS: 1.0000 | ORAL_TABLET | Freq: Four times a day (QID) | ORAL | 0 refills | Status: DC | PRN

## 2016-10-27 NOTE — Progress Notes (Signed)
Patient name: NYLAYAH HAWA MRN: [id number] DOB: [birth year] Sex: female  REASON FOR VISIT: post op  HPI: This is a 82 y.o. female who initially presented in October 2010 with ischemic changes to her left foot. She underwent distal left superficial femoral to below knee popliteal artery bypass graft with reversed ipsilateral greater saphenous vein she did have issues with wound healing which ultimately resolved. She developed a high-grade stenosis within the popliteal artery above the proximal anastomosis which was stented in 2011. She was also found to have an elevated velocity and on 12/11/2010 and in-stent stenosis was re\re stented using a 6 x 30 EV3 stents.   She recently had an ultrasound that showed native superficial femoral artery stenosis proximal to her stents. Therefore on 01/05/2015 she underwent angiography. At that time, she had stenting of her left external iliac stenosis with a 7 x 30 self-expanding stent as well as stenting of her left superficial femoral artery using a 6 x 30 self-expanding stent. The lesion in the superficial femoral artery was initially treated with a drug coated balloon angioplasty which was complicated by non-flow limiting dissection requiring stenting the bypass graft was found to be widely patent with two-vessel runoff via the peroneal and anterior tibial artery.  She developed velocity elevations again and in December 2016 underwent angiography and stenting as well as in-stent stenosis angioplasty of her left superficial femoral-popliteal artery. She again had velocity elevations recently and on 04/01/2016 I performed atherectomy with a jetstream device and drug coated balloon angioplasty.   She recently presented with bilateral rest pain. She went back to the cath lab for angiogram on 09/05/2016 and had drug coated balloon angioplasty and stenting of her right superficial femoral artery. This was done for  occlusion. She was then brought back for evaluation of her left leg. She was found to have occlusion of her native superficial femoral artery above her femoral-popliteal bypass graft which remained patent.  On 09/24/2016 she went to the operating room for a left femoral to above-knee popliteal artery bypass graft with 6 mm propatent PTFE.  The distal anastomosis was to the previous vein bypass graft which was somewhat sclerotic.  On postoperative day 2 the graft was occluded and she developed severe pain.  She went back urgently to the operating room and had subintimal angioplasty and subsequent stenting using Viabahn stents.  She also had angioplasty of anterior tibial artery.  In the recovery room she did not have a pulse in her right groin and therefore she went back for exploration and ended up with a femoral endarterectomy with patch angioplasty.  While in the hospital she developed atrial fibrillation with rapid ventricular response.  She converted with metoprolol.  She was started on Eliquis, in addition to her Plavix.  Unfortunately, she developed a GI bleed requiring admission to the hospital.  Her Elavil this has been stopped.  She is complaining of pain in her right great toe as well as her left calf.  Current Outpatient Prescriptions  Medication Sig Dispense Refill  . acetaminophen (TYLENOL) 500 MG tablet Take 1,000 mg by mouth 2 (two) times daily as needed for moderate pain or headache. Patient took this medication for her pain.    . Cholecalciferol (VITAMIN D3) 2000 UNITS TABS Take 2,000 Units by mouth at bedtime.     . clopidogrel (PLAVIX) 75 MG tablet Take 75 mg by mouth daily.    . Cyanocobalamin (VITAMIN B-12 PO) Take 1 tablet by mouth daily.    Marland Kitchen  diazepam (VALIUM) 5 MG tablet Take 2.5 mg by mouth at bedtime as needed for anxiety.     . gabapentin (NEURONTIN) 300 MG capsule Take 2 capsules (600 mg total) by mouth 2 (two) times daily. 120 capsule 3  . HYDROcodone-acetaminophen  (NORCO/VICODIN) 5-325 MG tablet Take 1 tablet by mouth every 6 (six) hours as needed. 20 tablet 0  . loperamide (IMODIUM) 2 MG capsule Take 2 mg by mouth as needed for diarrhea or loose stools.    . metoprolol (LOPRESSOR) 50 MG tablet Take 1 tablet (50 mg total) by mouth 2 (two) times daily. Take 50mg  by mouth in the morning and take 25mg  by mouth at bedtime. 60 tablet 0  . Multiple Vitamins-Minerals (EYE VITAMINS PO) Take 1 tablet by mouth at bedtime.     . pantoprazole (PROTONIX) 40 MG tablet Take 1 tablet (40 mg total) by mouth daily. 30 tablet 1  . valsartan-hydrochlorothiazide (DIOVAN-HCT) 160-12.5 MG per tablet Take 0.5 tablets by mouth daily.    . traMADol (ULTRAM) 50 MG tablet Take 50 mg by mouth at bedtime as needed for moderate pain.      No current facility-administered medications for this visit.     REVIEW OF SYSTEMS:  [X]  denotes positive finding, [ ]  denotes negative finding Cardiac  Comments:  Chest pain or chest pressure:    Shortness of breath upon exertion:    Short of breath when lying flat:    Irregular heart rhythm:    Constitutional    Fever or chills:      PHYSICAL EXAM: Vitals:   10/27/16 1603 10/27/16 1608  BP: (!) 177/76 (!) 170/75  Pulse: 70 60  Resp: 14   Temp: 97.3 F (36.3 C)   SpO2: 97%   Weight: 127 lb (57.6 kg)   Height: 5\' 2"  (1.575 m)     GENERAL: The patient is a well-nourished female, in no acute distress. The vital signs are documented above. CARDIOVASCULAR: There is a regular rate and rhythm. PULMONARY: Non-labored respirations Palpable left dorsalis pedis pulse.  Biphasic right dorsalis pedis Doppler signal Hyperemic appearance of the right great toe  MEDICAL ISSUES: I discussed with the patient that since her right great toe is causing her significant trouble we can consider amputation.  I discussed with her and her daughter that if this does not heal, she will potentially need a more proximal amputation.  She is not willing to  undergo amputation at this time and will try to manage the pain.  I am going to prescribe her nitroglycerin patch to see if this provides or any extra relief.  She will get an additional 15 hydrocodone 5 mg tablets today.  She is scheduled to come back in January for follow-up visit with duplex ultrasound of both lower extremities and ABIs.  Currently she is on aspirin and Plavix.  The Eliquis has been discontinued.  [name], MD [facility] [phone] Pager [phone]

## 2016-10-27 NOTE — Progress Notes (Signed)
Vitals:   10/27/16 1603  BP: (!) 177/76  Pulse: 70  Resp: 14  Temp: 97.3 F (36.3 C)  SpO2: 97%  Weight: 127 lb (57.6 kg)  Height: 5\' 2"  (1.575 m)

## 2016-10-28 DIAGNOSIS — M6281 Muscle weakness (generalized): Secondary | ICD-10-CM | POA: Diagnosis not present

## 2016-10-28 DIAGNOSIS — I480 Paroxysmal atrial fibrillation: Secondary | ICD-10-CM | POA: Diagnosis not present

## 2016-10-28 DIAGNOSIS — I10 Essential (primary) hypertension: Secondary | ICD-10-CM | POA: Diagnosis not present

## 2016-10-28 DIAGNOSIS — Z48812 Encounter for surgical aftercare following surgery on the circulatory system: Secondary | ICD-10-CM | POA: Diagnosis not present

## 2016-10-28 DIAGNOSIS — F329 Major depressive disorder, single episode, unspecified: Secondary | ICD-10-CM | POA: Diagnosis not present

## 2016-10-28 DIAGNOSIS — I739 Peripheral vascular disease, unspecified: Secondary | ICD-10-CM | POA: Diagnosis not present

## 2016-10-29 DIAGNOSIS — I739 Peripheral vascular disease, unspecified: Secondary | ICD-10-CM | POA: Diagnosis not present

## 2016-10-29 DIAGNOSIS — K922 Gastrointestinal hemorrhage, unspecified: Secondary | ICD-10-CM | POA: Diagnosis not present

## 2016-10-30 DIAGNOSIS — I480 Paroxysmal atrial fibrillation: Secondary | ICD-10-CM | POA: Diagnosis not present

## 2016-10-30 DIAGNOSIS — I10 Essential (primary) hypertension: Secondary | ICD-10-CM | POA: Diagnosis not present

## 2016-10-30 DIAGNOSIS — M6281 Muscle weakness (generalized): Secondary | ICD-10-CM | POA: Diagnosis not present

## 2016-10-30 DIAGNOSIS — Z48812 Encounter for surgical aftercare following surgery on the circulatory system: Secondary | ICD-10-CM | POA: Diagnosis not present

## 2016-10-30 DIAGNOSIS — I739 Peripheral vascular disease, unspecified: Secondary | ICD-10-CM | POA: Diagnosis not present

## 2016-10-30 DIAGNOSIS — F329 Major depressive disorder, single episode, unspecified: Secondary | ICD-10-CM | POA: Diagnosis not present

## 2016-10-31 DIAGNOSIS — I739 Peripheral vascular disease, unspecified: Secondary | ICD-10-CM | POA: Diagnosis not present

## 2016-10-31 DIAGNOSIS — I10 Essential (primary) hypertension: Secondary | ICD-10-CM | POA: Diagnosis not present

## 2016-10-31 DIAGNOSIS — F329 Major depressive disorder, single episode, unspecified: Secondary | ICD-10-CM | POA: Diagnosis not present

## 2016-10-31 DIAGNOSIS — M6281 Muscle weakness (generalized): Secondary | ICD-10-CM | POA: Diagnosis not present

## 2016-10-31 DIAGNOSIS — I480 Paroxysmal atrial fibrillation: Secondary | ICD-10-CM | POA: Diagnosis not present

## 2016-10-31 DIAGNOSIS — Z48812 Encounter for surgical aftercare following surgery on the circulatory system: Secondary | ICD-10-CM | POA: Diagnosis not present

## 2016-11-03 DIAGNOSIS — I480 Paroxysmal atrial fibrillation: Secondary | ICD-10-CM | POA: Diagnosis not present

## 2016-11-03 DIAGNOSIS — M6281 Muscle weakness (generalized): Secondary | ICD-10-CM | POA: Diagnosis not present

## 2016-11-03 DIAGNOSIS — I739 Peripheral vascular disease, unspecified: Secondary | ICD-10-CM | POA: Diagnosis not present

## 2016-11-03 DIAGNOSIS — I10 Essential (primary) hypertension: Secondary | ICD-10-CM | POA: Diagnosis not present

## 2016-11-03 DIAGNOSIS — Z48812 Encounter for surgical aftercare following surgery on the circulatory system: Secondary | ICD-10-CM | POA: Diagnosis not present

## 2016-11-03 DIAGNOSIS — F329 Major depressive disorder, single episode, unspecified: Secondary | ICD-10-CM | POA: Diagnosis not present

## 2016-11-04 ENCOUNTER — Other Ambulatory Visit: Payer: Self-pay | Admitting: *Deleted

## 2016-11-04 DIAGNOSIS — F329 Major depressive disorder, single episode, unspecified: Secondary | ICD-10-CM | POA: Diagnosis not present

## 2016-11-04 DIAGNOSIS — R29898 Other symptoms and signs involving the musculoskeletal system: Secondary | ICD-10-CM

## 2016-11-04 DIAGNOSIS — I480 Paroxysmal atrial fibrillation: Secondary | ICD-10-CM | POA: Diagnosis not present

## 2016-11-04 DIAGNOSIS — M6281 Muscle weakness (generalized): Secondary | ICD-10-CM | POA: Diagnosis not present

## 2016-11-04 DIAGNOSIS — I10 Essential (primary) hypertension: Secondary | ICD-10-CM | POA: Diagnosis not present

## 2016-11-04 DIAGNOSIS — I739 Peripheral vascular disease, unspecified: Secondary | ICD-10-CM | POA: Diagnosis not present

## 2016-11-04 DIAGNOSIS — Z48812 Encounter for surgical aftercare following surgery on the circulatory system: Secondary | ICD-10-CM | POA: Diagnosis not present

## 2016-11-05 ENCOUNTER — Encounter: Payer: Self-pay | Admitting: *Deleted

## 2016-11-05 ENCOUNTER — Other Ambulatory Visit: Payer: Self-pay | Admitting: *Deleted

## 2016-11-05 DIAGNOSIS — I739 Peripheral vascular disease, unspecified: Secondary | ICD-10-CM | POA: Diagnosis not present

## 2016-11-05 DIAGNOSIS — I10 Essential (primary) hypertension: Secondary | ICD-10-CM | POA: Diagnosis not present

## 2016-11-05 DIAGNOSIS — F329 Major depressive disorder, single episode, unspecified: Secondary | ICD-10-CM | POA: Diagnosis not present

## 2016-11-05 DIAGNOSIS — Z48812 Encounter for surgical aftercare following surgery on the circulatory system: Secondary | ICD-10-CM | POA: Diagnosis not present

## 2016-11-05 DIAGNOSIS — M6281 Muscle weakness (generalized): Secondary | ICD-10-CM | POA: Diagnosis not present

## 2016-11-05 DIAGNOSIS — I480 Paroxysmal atrial fibrillation: Secondary | ICD-10-CM | POA: Diagnosis not present

## 2016-11-05 DIAGNOSIS — M25579 Pain in unspecified ankle and joints of unspecified foot: Secondary | ICD-10-CM

## 2016-11-05 MED ORDER — DICLOFENAC SODIUM 1 % TD GEL: 2.0000 g | Freq: Four times a day (QID) | TRANSDERMAL | 2 refills | Status: DC

## 2016-11-06 DIAGNOSIS — I739 Peripheral vascular disease, unspecified: Secondary | ICD-10-CM | POA: Diagnosis not present

## 2016-11-06 DIAGNOSIS — F329 Major depressive disorder, single episode, unspecified: Secondary | ICD-10-CM | POA: Diagnosis not present

## 2016-11-06 DIAGNOSIS — Z48812 Encounter for surgical aftercare following surgery on the circulatory system: Secondary | ICD-10-CM | POA: Diagnosis not present

## 2016-11-06 DIAGNOSIS — I10 Essential (primary) hypertension: Secondary | ICD-10-CM | POA: Diagnosis not present

## 2016-11-06 DIAGNOSIS — I480 Paroxysmal atrial fibrillation: Secondary | ICD-10-CM | POA: Diagnosis not present

## 2016-11-06 DIAGNOSIS — M6281 Muscle weakness (generalized): Secondary | ICD-10-CM | POA: Diagnosis not present

## 2016-11-07 DIAGNOSIS — I480 Paroxysmal atrial fibrillation: Secondary | ICD-10-CM | POA: Diagnosis not present

## 2016-11-07 DIAGNOSIS — Z48812 Encounter for surgical aftercare following surgery on the circulatory system: Secondary | ICD-10-CM | POA: Diagnosis not present

## 2016-11-07 DIAGNOSIS — I739 Peripheral vascular disease, unspecified: Secondary | ICD-10-CM | POA: Diagnosis not present

## 2016-11-07 DIAGNOSIS — F329 Major depressive disorder, single episode, unspecified: Secondary | ICD-10-CM | POA: Diagnosis not present

## 2016-11-07 DIAGNOSIS — I10 Essential (primary) hypertension: Secondary | ICD-10-CM | POA: Diagnosis not present

## 2016-11-07 DIAGNOSIS — M6281 Muscle weakness (generalized): Secondary | ICD-10-CM | POA: Diagnosis not present

## 2016-11-12 DIAGNOSIS — I10 Essential (primary) hypertension: Secondary | ICD-10-CM | POA: Diagnosis not present

## 2016-11-12 DIAGNOSIS — I739 Peripheral vascular disease, unspecified: Secondary | ICD-10-CM | POA: Diagnosis not present

## 2016-11-12 DIAGNOSIS — I480 Paroxysmal atrial fibrillation: Secondary | ICD-10-CM | POA: Diagnosis not present

## 2016-11-12 DIAGNOSIS — Z48812 Encounter for surgical aftercare following surgery on the circulatory system: Secondary | ICD-10-CM | POA: Diagnosis not present

## 2016-11-12 DIAGNOSIS — M6281 Muscle weakness (generalized): Secondary | ICD-10-CM | POA: Diagnosis not present

## 2016-11-12 DIAGNOSIS — F329 Major depressive disorder, single episode, unspecified: Secondary | ICD-10-CM | POA: Diagnosis not present

## 2016-11-14 DIAGNOSIS — I480 Paroxysmal atrial fibrillation: Secondary | ICD-10-CM | POA: Diagnosis not present

## 2016-11-14 DIAGNOSIS — I739 Peripheral vascular disease, unspecified: Secondary | ICD-10-CM | POA: Diagnosis not present

## 2016-11-14 DIAGNOSIS — Z48812 Encounter for surgical aftercare following surgery on the circulatory system: Secondary | ICD-10-CM | POA: Diagnosis not present

## 2016-11-14 DIAGNOSIS — I10 Essential (primary) hypertension: Secondary | ICD-10-CM | POA: Diagnosis not present

## 2016-11-14 DIAGNOSIS — F329 Major depressive disorder, single episode, unspecified: Secondary | ICD-10-CM | POA: Diagnosis not present

## 2016-11-14 DIAGNOSIS — M6281 Muscle weakness (generalized): Secondary | ICD-10-CM | POA: Diagnosis not present

## 2016-11-18 DIAGNOSIS — Z48812 Encounter for surgical aftercare following surgery on the circulatory system: Secondary | ICD-10-CM | POA: Diagnosis not present

## 2016-11-18 DIAGNOSIS — I10 Essential (primary) hypertension: Secondary | ICD-10-CM | POA: Diagnosis not present

## 2016-11-18 DIAGNOSIS — I480 Paroxysmal atrial fibrillation: Secondary | ICD-10-CM | POA: Diagnosis not present

## 2016-11-18 DIAGNOSIS — F329 Major depressive disorder, single episode, unspecified: Secondary | ICD-10-CM | POA: Diagnosis not present

## 2016-11-18 DIAGNOSIS — M6281 Muscle weakness (generalized): Secondary | ICD-10-CM | POA: Diagnosis not present

## 2016-11-18 DIAGNOSIS — I739 Peripheral vascular disease, unspecified: Secondary | ICD-10-CM | POA: Diagnosis not present

## 2016-11-19 DIAGNOSIS — F329 Major depressive disorder, single episode, unspecified: Secondary | ICD-10-CM | POA: Diagnosis not present

## 2016-11-19 DIAGNOSIS — M6281 Muscle weakness (generalized): Secondary | ICD-10-CM | POA: Diagnosis not present

## 2016-11-19 DIAGNOSIS — I739 Peripheral vascular disease, unspecified: Secondary | ICD-10-CM | POA: Diagnosis not present

## 2016-11-19 DIAGNOSIS — I480 Paroxysmal atrial fibrillation: Secondary | ICD-10-CM | POA: Diagnosis not present

## 2016-11-19 DIAGNOSIS — I10 Essential (primary) hypertension: Secondary | ICD-10-CM | POA: Diagnosis not present

## 2016-11-19 DIAGNOSIS — Z48812 Encounter for surgical aftercare following surgery on the circulatory system: Secondary | ICD-10-CM | POA: Diagnosis not present

## 2016-11-20 DIAGNOSIS — I10 Essential (primary) hypertension: Secondary | ICD-10-CM | POA: Diagnosis not present

## 2016-11-20 DIAGNOSIS — M6281 Muscle weakness (generalized): Secondary | ICD-10-CM | POA: Diagnosis not present

## 2016-11-20 DIAGNOSIS — I480 Paroxysmal atrial fibrillation: Secondary | ICD-10-CM | POA: Diagnosis not present

## 2016-11-20 DIAGNOSIS — F329 Major depressive disorder, single episode, unspecified: Secondary | ICD-10-CM | POA: Diagnosis not present

## 2016-11-20 DIAGNOSIS — I739 Peripheral vascular disease, unspecified: Secondary | ICD-10-CM | POA: Diagnosis not present

## 2016-11-20 DIAGNOSIS — Z48812 Encounter for surgical aftercare following surgery on the circulatory system: Secondary | ICD-10-CM | POA: Diagnosis not present

## 2016-11-21 DIAGNOSIS — I480 Paroxysmal atrial fibrillation: Secondary | ICD-10-CM | POA: Diagnosis not present

## 2016-11-21 DIAGNOSIS — I10 Essential (primary) hypertension: Secondary | ICD-10-CM | POA: Diagnosis not present

## 2016-11-21 DIAGNOSIS — M6281 Muscle weakness (generalized): Secondary | ICD-10-CM | POA: Diagnosis not present

## 2016-11-21 DIAGNOSIS — Z48812 Encounter for surgical aftercare following surgery on the circulatory system: Secondary | ICD-10-CM | POA: Diagnosis not present

## 2016-11-21 DIAGNOSIS — I739 Peripheral vascular disease, unspecified: Secondary | ICD-10-CM | POA: Diagnosis not present

## 2016-11-21 DIAGNOSIS — F329 Major depressive disorder, single episode, unspecified: Secondary | ICD-10-CM | POA: Diagnosis not present

## 2016-11-26 DIAGNOSIS — I480 Paroxysmal atrial fibrillation: Secondary | ICD-10-CM | POA: Diagnosis not present

## 2016-11-26 DIAGNOSIS — M6281 Muscle weakness (generalized): Secondary | ICD-10-CM | POA: Diagnosis not present

## 2016-11-26 DIAGNOSIS — Z48812 Encounter for surgical aftercare following surgery on the circulatory system: Secondary | ICD-10-CM | POA: Diagnosis not present

## 2016-11-26 DIAGNOSIS — F329 Major depressive disorder, single episode, unspecified: Secondary | ICD-10-CM | POA: Diagnosis not present

## 2016-11-26 DIAGNOSIS — I739 Peripheral vascular disease, unspecified: Secondary | ICD-10-CM | POA: Diagnosis not present

## 2016-11-26 DIAGNOSIS — I10 Essential (primary) hypertension: Secondary | ICD-10-CM | POA: Diagnosis not present

## 2016-12-01 ENCOUNTER — Ambulatory Visit (INDEPENDENT_AMBULATORY_CARE_PROVIDER_SITE_OTHER): Payer: Medicare Other | Admitting: Vascular Surgery

## 2016-12-01 ENCOUNTER — Encounter: Payer: Self-pay | Admitting: *Deleted

## 2016-12-01 ENCOUNTER — Encounter: Payer: Self-pay | Admitting: Vascular Surgery

## 2016-12-01 ENCOUNTER — Other Ambulatory Visit: Payer: Self-pay | Admitting: *Deleted

## 2016-12-01 DIAGNOSIS — I70229 Atherosclerosis of native arteries of extremities with rest pain, unspecified extremity: Secondary | ICD-10-CM | POA: Diagnosis not present

## 2016-12-01 DIAGNOSIS — T814XXD Infection following a procedure, subsequent encounter: Principal | ICD-10-CM

## 2016-12-01 DIAGNOSIS — IMO0001 Reserved for inherently not codable concepts without codable children: Secondary | ICD-10-CM

## 2016-12-01 MED ORDER — CEPHALEXIN 500 MG PO CAPS: 500.0000 mg | ORAL_CAPSULE | Freq: Three times a day (TID) | ORAL | 0 refills | Status: DC

## 2016-12-01 MED ORDER — HYDROCODONE-ACETAMINOPHEN 5-325 MG PO TABS: 1.0000 | ORAL_TABLET | ORAL | 0 refills | Status: DC | PRN

## 2016-12-01 NOTE — Progress Notes (Signed)
[facility]   Patient name: [name] MRN: [id number] DOB: Jan 10, 1928 Sex: female  REASON FOR VISIT: add-on   HPI: [name] is a 83 y.o. female who presents with worsening right great toe wound and pain. She states that her pain is intolerable and she is unable to sleep. She was last seen in the office by [name] on 10/27/16 where toe amputation was discussed if her right great toe wound worsened.   She has a complicated past vascular history. She previously underwent angioplasty and stenting of her right superficial femoral artery on 09/05/16. This was done for occlusion. On 09/24/2016 she went to the operating room for a left femoral to above-knee popliteal artery bypass graft with 6 mm propatent PTFE.  The distal anastomosis was to the previous vein bypass graft which was somewhat sclerotic.  On postoperative day 2 the graft was occluded and she developed severe pain.  She went back urgently to the operating room and had subintimal angioplasty and subsequent stenting using Viabahn stents.  She also had angioplasty of anterior tibial artery.  In the recovery room she did not have a pulse in her right groin and therefore she went back for exploration and ended up with a femoral endarterectomy with patch angioplasty.  While in the hospital she developed atrial fibrillation with rapid ventricular response.  She converted with metoprolol. She was started on Eliquis, in addition to her Plavix.  Unfortunately, she developed a GI bleed requiring admission to the hospital.  Her Eliquis has been discontinued. She is currently on Plavix and ASA only.   Past Medical History:  Diagnosis Date  . Anemia   . Anginal pain ([place])   . Arthritis    "qwhere" (09/05/2016)  . Chronic lower back pain   . Complication of anesthesia    "takes a long time for it to wear off; I can hallucinate if I take too much" (09/05/2016)  . DVT (deep venous thrombosis) ([place])  10/2009  . Fall from steps 08/31/2013   Fx. pelvis, Left Hip, Left Elbow  . Fibromyalgia   . GERD (gastroesophageal reflux disease)    09/22/16- "no too much anymore"  . GI bleed 10/24/2016  . High cholesterol   . History of hiatal hernia   . Hypertension   . Macular degeneration, wet ([place])    "started in right eye; now legally blind in that eye; now started in left eye but pretty much in control" (09/05/2016)  . Osteoporosis   . Peripheral vascular disease ([place])   . Squamous cell carcinoma of skin of right calf Aug. 2015  . Stroke ([place])    TIA's no residual  . TIA (transient ischemic attack)    "several at once; none in a long time" (09/05/2016)    Family History  Problem Relation Age of Onset  . Heart disease Father     Heart Disease before age 30  . Hyperlipidemia Father   . Hypertension Father   . Alcohol abuse Father   . Heart disease Brother   . Hyperlipidemia Brother   . Hypertension Brother   . Deep vein thrombosis Brother   . Heart disease Son     Heart Disease before age 78  . Hyperlipidemia Son   . Hypertension Son   . Heart attack Son   . Diabetes Son   . Hypertension Son   . Hyperlipidemia Sister   . Hypertension Sister     SOCIAL HISTORY: Social History  Substance Use Topics  . Smoking status: Former Smoker    Types: Cigarettes    Quit date: 11/17/1946  . Smokeless tobacco: Never Used     Comment: "never smoked much"  . Alcohol use No    Allergies  Allergen Reactions  . Motrin [Ibuprofen] Other (See Comments)    ADVERSE REACTION - GI BLEED  . Morphine And Related Other (See Comments)    HALLUCINATIONS REACTION IS SIDE EFFECT  . Oxycontin [Oxycodone Hcl] Other (See Comments)    [REACTION IS SIDE EFFECT]  Hallinatations  . Statins Other (See Comments)    ADVERSE REACTION MUSCLE PAIN & WEAKNESS  . Promethazine Hcl Other (See Comments)    IV  Drug only, makes her act crazy  . Sulfa Antibiotics Nausea And Vomiting    Current Outpatient  Prescriptions  Medication Sig Dispense Refill  . acetaminophen (TYLENOL) 500 MG tablet Take 1,000 mg by mouth 2 (two) times daily as needed for moderate pain or headache. Patient took this medication for her pain.    . Cholecalciferol (VITAMIN D3) 2000 UNITS TABS Take 2,000 Units by mouth at bedtime.     . clopidogrel (PLAVIX) 75 MG tablet Take 75 mg by mouth daily.    . Cyanocobalamin (VITAMIN B-12 PO) Take 1 tablet by mouth daily.    . diazepam (VALIUM) 5 MG tablet Take 2.5 mg by mouth at bedtime as needed for anxiety.     . diclofenac sodium (VOLTAREN) 1 % GEL Apply 2 g topically 4 (four) times daily. 1 Tube 2  . gabapentin (NEURONTIN) 300 MG capsule Take 2 capsules (600 mg total) by mouth 2 (two) times daily. 120 capsule 3  . HYDROcodone-acetaminophen (NORCO/VICODIN) 5-325 MG tablet Take 1 tablet by mouth every 6 (six) hours as needed. 20 tablet 0  . loperamide (IMODIUM) 2 MG capsule Take 2 mg by mouth as needed for diarrhea or loose stools.    . metoprolol (LOPRESSOR) 50 MG tablet Take 1 tablet (50 mg total) by mouth 2 (two) times daily. Take 50mg  by mouth in the morning and take 25mg  by mouth at bedtime. 60 tablet 0  . Multiple Vitamins-Minerals (EYE VITAMINS PO) Take 1 tablet by mouth at bedtime.     . nitroGLYCERIN (NITRO-DUR) 0.2 mg/hr patch Place 1 patch (0.2 mg total) onto the skin daily. 5 patch 12  . pantoprazole (PROTONIX) 40 MG tablet Take 1 tablet (40 mg total) by mouth daily. 30 tablet 1  . traMADol (ULTRAM) 50 MG tablet Take 50 mg by mouth at bedtime as needed for moderate pain.     . valsartan-hydrochlorothiazide (DIOVAN-HCT) 160-12.5 MG per tablet Take 0.5 tablets by mouth daily.     No current facility-administered medications for this visit.     REVIEW OF SYSTEMS:  [X]  denotes positive finding, [ ]  denotes negative finding Cardiac  Comments:  Chest pain or chest pressure:    Shortness of breath upon exertion:    Short of breath when lying flat:    Irregular heart  rhythm:        Vascular    Pain in calf, thigh, or hip brought on by ambulation:    Pain in feet at night that wakes you up from your sleep:     Blood clot in your veins:    Leg swelling:         Pulmonary    Oxygen at home:    Productive cough:     Wheezing:  Neurologic    Sudden weakness in arms or legs:     Sudden numbness in arms or legs:     Sudden onset of difficulty speaking or slurred speech:    Temporary loss of vision in one eye:     Problems with dizziness:         Gastrointestinal    Blood in stool:     Vomited blood:         Genitourinary    Burning when urinating:     Blood in urine:        Psychiatric    Major depression:         Hematologic    Bleeding problems:    Problems with blood clotting too easily:        Skin    Rashes or ulcers: x Right great toe wound      Constitutional    Fever or chills:      PHYSICAL EXAM: Vitals:   12/01/16 1420  BP: (!) 153/75  Pulse: 67  Resp: 18  Temp: 97.4 F (36.3 C)  TempSrc: Oral  SpO2: 94%  Weight: 128 lb 6.4 oz (58.2 kg)  Height: 5\' 2"  (1.575 m)    GENERAL: The patient is a well-nourished female, in no acute distress. The vital signs are documented above. CARDIAC: There is a regular rate and rhythm. No carotid bruits. VASCULAR: Palpable left dorsalis pedis pulse. Audible doppler flow right DP and at medial 1st MTP. Right great toe is red/purple with wound to medial aspect. No drainage seen. Red/purple discoloration to distal half of foot on the plantar aspect. Very tender to palpation right great toe and distal foot.  PULMONARY: There is good air exchange bilaterally without wheezing or rales.  NEUROLOGIC: Motor and sensory function intact right foot.  SKIN: There are no ulcers or rashes noted. PSYCHIATRIC: The patient has a normal affect.   MEDICAL ISSUES: Non healing right great toe wound  The patient's right toe wound has worsened and her pain has become intolerable. The patient does  have some ischemic changes involving the distal half of the right foot as well. Discussed proceeding with right great toe amputation versus transmetatarsal amputation. The patient and daughter have elected to proceed with right transmetatarsal amputation. They understand that this may not heal and require more proximal amputation. This will be scheduled for 12/11/16 with [name] per patient preference. The patient understands that if she is unable to withstand her pain until then, then other partners are available for her surgery. She is no longer on Eliquis, only ASA and Plavix.   Hydrocodone 5 mg # 30 prescribed Keflex also prescribed for infection prophylaxis per patient request  [name], PA-C [facility] MD: Trula Slade

## 2016-12-08 ENCOUNTER — Telehealth: Payer: Self-pay

## 2016-12-08 DIAGNOSIS — I70235 Atherosclerosis of native arteries of right leg with ulceration of other part of foot: Secondary | ICD-10-CM

## 2016-12-08 DIAGNOSIS — I70229 Atherosclerosis of native arteries of extremities with rest pain, unspecified extremity: Secondary | ICD-10-CM

## 2016-12-08 MED ORDER — HYDROCODONE-ACETAMINOPHEN 5-325 MG PO TABS: 1.0000 | ORAL_TABLET | ORAL | 0 refills | Status: DC | PRN

## 2016-12-08 NOTE — Telephone Encounter (Signed)
[name] called to request refill on pt's pain medication.  Reported the pt. Takes 1.5-2 tablets per dose, and that it isn't touching the pain.  Stated she only has 3 more pain medications left.  Is scheduled for Right TMA 12/11/16.  Discussed with [name].  Advised that the pt. may have refill on Norco 5/325 mg; # 30; no refills.  Also offered that the pt. Could be admitted to the hospital, but to inform the patient and daughter, that he is not able to perform her operation any sooner than Thurs. Called daughter.  Informed of Dr. Stephens Shire recommendations.  Stated she feels her mother will be more comfortable at home.  Will have Rx ready at front desk for pick-up.  Daughter verb. understanding.

## 2016-12-10 ENCOUNTER — Other Ambulatory Visit (HOSPITAL_COMMUNITY): Payer: Medicare Other

## 2016-12-10 ENCOUNTER — Encounter (HOSPITAL_COMMUNITY): Payer: Self-pay | Admitting: *Deleted

## 2016-12-10 ENCOUNTER — Encounter (HOSPITAL_COMMUNITY): Payer: Medicare Other

## 2016-12-10 NOTE — Progress Notes (Signed)
Dr. Marcie Bal, Anesthesia, reviewed pt EKG. MD advised that an EKG be done upon arrival.

## 2016-12-10 NOTE — Progress Notes (Signed)
error 

## 2016-12-10 NOTE — Progress Notes (Signed)
Pt denies SOB, chest pain, and being under the care of a cardiologist. Pt denies having a cardiac cath. Pt stated that she was advised to hold Plavix on DOS. Pt made aware to stop taking vitamins, fish oil and herbal medications. Do not take any NSAIDs ie: Ibuprofen, Advil, Naproxen, BC and Goody Powder. Pt verbalized understanding of all pre-op instructions. Anesthesia asked to review pt EKG.

## 2016-12-11 ENCOUNTER — Encounter (HOSPITAL_COMMUNITY)
Admission: RE | Disposition: A | Discharge status: Rehabilitation Facility | Payer: Self-pay | Source: Ambulatory Visit | Attending: Surgery

## 2016-12-11 ENCOUNTER — Encounter (HOSPITAL_COMMUNITY): Payer: Self-pay | Admitting: *Deleted

## 2016-12-11 ENCOUNTER — Ambulatory Visit (HOSPITAL_COMMUNITY): Payer: Medicare Other | Admitting: Vascular Surgery

## 2016-12-11 ENCOUNTER — Inpatient Hospital Stay (HOSPITAL_COMMUNITY)
Admission: RE | Admit: 2016-12-11 | Discharge: 2016-12-30 | DRG: 239 | Disposition: A | Discharge status: Rehabilitation Facility | Payer: Medicare Other | Source: Ambulatory Visit | Attending: Surgery | Admitting: Surgery

## 2016-12-11 DIAGNOSIS — I5033 Acute on chronic diastolic (congestive) heart failure: Secondary | ICD-10-CM | POA: Diagnosis present

## 2016-12-11 DIAGNOSIS — Z885 Allergy status to narcotic agent status: Secondary | ICD-10-CM

## 2016-12-11 DIAGNOSIS — G8918 Other acute postprocedural pain: Secondary | ICD-10-CM | POA: Diagnosis not present

## 2016-12-11 DIAGNOSIS — F419 Anxiety disorder, unspecified: Secondary | ICD-10-CM | POA: Diagnosis not present

## 2016-12-11 DIAGNOSIS — G8929 Other chronic pain: Secondary | ICD-10-CM | POA: Diagnosis present

## 2016-12-11 DIAGNOSIS — M545 Low back pain: Secondary | ICD-10-CM | POA: Diagnosis present

## 2016-12-11 DIAGNOSIS — M797 Fibromyalgia: Secondary | ICD-10-CM | POA: Diagnosis present

## 2016-12-11 DIAGNOSIS — Z515 Encounter for palliative care: Secondary | ICD-10-CM | POA: Diagnosis not present

## 2016-12-11 DIAGNOSIS — Z85828 Personal history of other malignant neoplasm of skin: Secondary | ICD-10-CM | POA: Diagnosis not present

## 2016-12-11 DIAGNOSIS — K449 Diaphragmatic hernia without obstruction or gangrene: Secondary | ICD-10-CM | POA: Diagnosis present

## 2016-12-11 DIAGNOSIS — N390 Urinary tract infection, site not specified: Secondary | ICD-10-CM | POA: Diagnosis not present

## 2016-12-11 DIAGNOSIS — M792 Neuralgia and neuritis, unspecified: Secondary | ICD-10-CM | POA: Diagnosis not present

## 2016-12-11 DIAGNOSIS — K5900 Constipation, unspecified: Secondary | ICD-10-CM | POA: Diagnosis present

## 2016-12-11 DIAGNOSIS — Z89511 Acquired absence of right leg below knee: Secondary | ICD-10-CM | POA: Diagnosis not present

## 2016-12-11 DIAGNOSIS — R0602 Shortness of breath: Secondary | ICD-10-CM

## 2016-12-11 DIAGNOSIS — Z9841 Cataract extraction status, right eye: Secondary | ICD-10-CM | POA: Diagnosis not present

## 2016-12-11 DIAGNOSIS — E785 Hyperlipidemia, unspecified: Secondary | ICD-10-CM | POA: Diagnosis not present

## 2016-12-11 DIAGNOSIS — G546 Phantom limb syndrome with pain: Secondary | ICD-10-CM | POA: Diagnosis not present

## 2016-12-11 DIAGNOSIS — Z961 Presence of intraocular lens: Secondary | ICD-10-CM | POA: Diagnosis present

## 2016-12-11 DIAGNOSIS — T8789 Other complications of amputation stump: Secondary | ICD-10-CM | POA: Diagnosis not present

## 2016-12-11 DIAGNOSIS — R079 Chest pain, unspecified: Secondary | ICD-10-CM | POA: Diagnosis not present

## 2016-12-11 DIAGNOSIS — M79604 Pain in right leg: Secondary | ICD-10-CM | POA: Diagnosis not present

## 2016-12-11 DIAGNOSIS — M810 Age-related osteoporosis without current pathological fracture: Secondary | ICD-10-CM | POA: Diagnosis present

## 2016-12-11 DIAGNOSIS — T8189XA Other complications of procedures, not elsewhere classified, initial encounter: Secondary | ICD-10-CM | POA: Diagnosis not present

## 2016-12-11 DIAGNOSIS — D62 Acute posthemorrhagic anemia: Secondary | ICD-10-CM | POA: Diagnosis not present

## 2016-12-11 DIAGNOSIS — I480 Paroxysmal atrial fibrillation: Secondary | ICD-10-CM | POA: Diagnosis present

## 2016-12-11 DIAGNOSIS — T8781 Dehiscence of amputation stump: Secondary | ICD-10-CM | POA: Diagnosis not present

## 2016-12-11 DIAGNOSIS — I998 Other disorder of circulatory system: Secondary | ICD-10-CM | POA: Diagnosis not present

## 2016-12-11 DIAGNOSIS — K219 Gastro-esophageal reflux disease without esophagitis: Secondary | ICD-10-CM | POA: Diagnosis present

## 2016-12-11 DIAGNOSIS — Y848 Other medical procedures as the cause of abnormal reaction of the patient, or of later complication, without mention of misadventure at the time of the procedure: Secondary | ICD-10-CM | POA: Diagnosis present

## 2016-12-11 DIAGNOSIS — E7800 Pure hypercholesterolemia, unspecified: Secondary | ICD-10-CM | POA: Diagnosis not present

## 2016-12-11 DIAGNOSIS — Z888 Allergy status to other drugs, medicaments and biological substances status: Secondary | ICD-10-CM

## 2016-12-11 DIAGNOSIS — D649 Anemia, unspecified: Secondary | ICD-10-CM

## 2016-12-11 DIAGNOSIS — I4891 Unspecified atrial fibrillation: Secondary | ICD-10-CM | POA: Diagnosis not present

## 2016-12-11 DIAGNOSIS — M79674 Pain in right toe(s): Secondary | ICD-10-CM | POA: Diagnosis present

## 2016-12-11 DIAGNOSIS — Z87891 Personal history of nicotine dependence: Secondary | ICD-10-CM

## 2016-12-11 DIAGNOSIS — I739 Peripheral vascular disease, unspecified: Secondary | ICD-10-CM | POA: Diagnosis not present

## 2016-12-11 DIAGNOSIS — I209 Angina pectoris, unspecified: Secondary | ICD-10-CM | POA: Diagnosis present

## 2016-12-11 DIAGNOSIS — M009 Pyogenic arthritis, unspecified: Secondary | ICD-10-CM | POA: Diagnosis not present

## 2016-12-11 DIAGNOSIS — Z79899 Other long term (current) drug therapy: Secondary | ICD-10-CM | POA: Diagnosis not present

## 2016-12-11 DIAGNOSIS — Z955 Presence of coronary angioplasty implant and graft: Secondary | ICD-10-CM

## 2016-12-11 DIAGNOSIS — E876 Hypokalemia: Secondary | ICD-10-CM | POA: Diagnosis not present

## 2016-12-11 DIAGNOSIS — I129 Hypertensive chronic kidney disease with stage 1 through stage 4 chronic kidney disease, or unspecified chronic kidney disease: Secondary | ICD-10-CM | POA: Diagnosis not present

## 2016-12-11 DIAGNOSIS — Z96622 Presence of left artificial elbow joint: Secondary | ICD-10-CM | POA: Diagnosis present

## 2016-12-11 DIAGNOSIS — R5381 Other malaise: Secondary | ICD-10-CM | POA: Diagnosis not present

## 2016-12-11 DIAGNOSIS — Z9842 Cataract extraction status, left eye: Secondary | ICD-10-CM

## 2016-12-11 DIAGNOSIS — Z5309 Procedure and treatment not carried out because of other contraindication: Secondary | ICD-10-CM | POA: Diagnosis not present

## 2016-12-11 DIAGNOSIS — Z8673 Personal history of transient ischemic attack (TIA), and cerebral infarction without residual deficits: Secondary | ICD-10-CM

## 2016-12-11 DIAGNOSIS — K921 Melena: Secondary | ICD-10-CM | POA: Diagnosis not present

## 2016-12-11 DIAGNOSIS — T148XXA Other injury of unspecified body region, initial encounter: Secondary | ICD-10-CM | POA: Diagnosis not present

## 2016-12-11 DIAGNOSIS — I10 Essential (primary) hypertension: Secondary | ICD-10-CM | POA: Diagnosis present

## 2016-12-11 DIAGNOSIS — N183 Chronic kidney disease, stage 3 (moderate): Secondary | ICD-10-CM | POA: Diagnosis not present

## 2016-12-11 DIAGNOSIS — Z882 Allergy status to sulfonamides status: Secondary | ICD-10-CM

## 2016-12-11 DIAGNOSIS — H353230 Exudative age-related macular degeneration, bilateral, stage unspecified: Secondary | ICD-10-CM | POA: Diagnosis not present

## 2016-12-11 DIAGNOSIS — J9600 Acute respiratory failure, unspecified whether with hypoxia or hypercapnia: Secondary | ICD-10-CM | POA: Diagnosis not present

## 2016-12-11 DIAGNOSIS — T8131XA Disruption of external operation (surgical) wound, not elsewhere classified, initial encounter: Secondary | ICD-10-CM | POA: Diagnosis not present

## 2016-12-11 DIAGNOSIS — Z96621 Presence of right artificial elbow joint: Secondary | ICD-10-CM | POA: Diagnosis present

## 2016-12-11 DIAGNOSIS — L89150 Pressure ulcer of sacral region, unstageable: Secondary | ICD-10-CM | POA: Diagnosis not present

## 2016-12-11 DIAGNOSIS — G629 Polyneuropathy, unspecified: Secondary | ICD-10-CM | POA: Diagnosis not present

## 2016-12-11 DIAGNOSIS — L089 Local infection of the skin and subcutaneous tissue, unspecified: Secondary | ICD-10-CM | POA: Diagnosis present

## 2016-12-11 DIAGNOSIS — E46 Unspecified protein-calorie malnutrition: Secondary | ICD-10-CM | POA: Diagnosis not present

## 2016-12-11 DIAGNOSIS — I5043 Acute on chronic combined systolic (congestive) and diastolic (congestive) heart failure: Secondary | ICD-10-CM

## 2016-12-11 DIAGNOSIS — G5732 Lesion of lateral popliteal nerve, left lower limb: Secondary | ICD-10-CM | POA: Diagnosis not present

## 2016-12-11 DIAGNOSIS — Z89421 Acquired absence of other right toe(s): Secondary | ICD-10-CM

## 2016-12-11 DIAGNOSIS — Z89431 Acquired absence of right foot: Secondary | ICD-10-CM | POA: Diagnosis not present

## 2016-12-11 DIAGNOSIS — Z4781 Encounter for orthopedic aftercare following surgical amputation: Secondary | ICD-10-CM | POA: Diagnosis not present

## 2016-12-11 DIAGNOSIS — L97919 Non-pressure chronic ulcer of unspecified part of right lower leg with unspecified severity: Secondary | ICD-10-CM | POA: Diagnosis not present

## 2016-12-11 DIAGNOSIS — M1990 Unspecified osteoarthritis, unspecified site: Secondary | ICD-10-CM | POA: Diagnosis present

## 2016-12-11 DIAGNOSIS — Z7189 Other specified counseling: Secondary | ICD-10-CM | POA: Diagnosis not present

## 2016-12-11 DIAGNOSIS — I130 Hypertensive heart and chronic kidney disease with heart failure and stage 1 through stage 4 chronic kidney disease, or unspecified chronic kidney disease: Secondary | ICD-10-CM | POA: Diagnosis present

## 2016-12-11 DIAGNOSIS — I70261 Atherosclerosis of native arteries of extremities with gangrene, right leg: Secondary | ICD-10-CM | POA: Diagnosis not present

## 2016-12-11 DIAGNOSIS — I5031 Acute diastolic (congestive) heart failure: Secondary | ICD-10-CM | POA: Diagnosis not present

## 2016-12-11 DIAGNOSIS — Z96652 Presence of left artificial knee joint: Secondary | ICD-10-CM | POA: Diagnosis present

## 2016-12-11 HISTORY — DX: Nausea with vomiting, unspecified: R11.2

## 2016-12-11 HISTORY — DX: Other specified postprocedural states: Z98.890

## 2016-12-11 HISTORY — PX: TRANSMETATARSAL AMPUTATION: SHX6197

## 2016-12-11 HISTORY — DX: Personal history of other medical treatment: Z92.89

## 2016-12-11 HISTORY — DX: Unspecified atrial fibrillation: I48.91

## 2016-12-11 LAB — BASIC METABOLIC PANEL
Anion gap: 11 (ref 5–15)
BUN: 19 mg/dL (ref 6–20)
CO2: 25 mmol/L (ref 22–32)
Calcium: 9.7 mg/dL (ref 8.9–10.3)
Chloride: 99 mmol/L — ABNORMAL LOW (ref 101–111)
Creatinine, Ser: 1.19 mg/dL — ABNORMAL HIGH (ref 0.44–1.00)
GFR calc Af Amer: 47 mL/min — ABNORMAL LOW (ref >60)
GFR, EST NON AFRICAN AMERICAN: 40 mL/min — AB (ref >60)
GLUCOSE: 114 mg/dL — AB (ref 65–99)
POTASSIUM: 4 mmol/L (ref 3.5–5.1)
Sodium: 135 mmol/L (ref 135–145)

## 2016-12-11 LAB — CREATININE, SERUM
Creatinine, Ser: 1.19 mg/dL — ABNORMAL HIGH (ref 0.44–1.00)
GFR calc Af Amer: 47 mL/min — ABNORMAL LOW (ref >60)
GFR calc non Af Amer: 40 mL/min — ABNORMAL LOW (ref >60)

## 2016-12-11 LAB — CBC
HEMATOCRIT: 36.8 % (ref 36.0–46.0)
HEMATOCRIT: 34.6 % — AB (ref 36.0–46.0)
HEMOGLOBIN: 11.1 g/dL — AB (ref 12.0–15.0)
Hemoglobin: 11.9 g/dL — ABNORMAL LOW (ref 12.0–15.0)
MCH: 29.8 pg (ref 26.0–34.0)
MCH: 29.4 pg (ref 26.0–34.0)
MCHC: 32.3 g/dL (ref 30.0–36.0)
MCHC: 32.1 g/dL (ref 30.0–36.0)
MCV: 92 fL (ref 78.0–100.0)
MCV: 91.8 fL (ref 78.0–100.0)
Platelets: 274 10*3/uL (ref 150–400)
Platelets: 245 10*3/uL (ref 150–400)
RBC: 4 MIL/uL (ref 3.87–5.11)
RBC: 3.77 MIL/uL — ABNORMAL LOW (ref 3.87–5.11)
RDW: 15.5 % (ref 11.5–15.5)
RDW: 15.1 % (ref 11.5–15.5)
WBC: 7.5 10*3/uL (ref 4.0–10.5)
WBC: 7.2 10*3/uL (ref 4.0–10.5)

## 2016-12-11 SURGERY — AMPUTATION, FOOT, TRANSMETATARSAL
Anesthesia: General | Site: Foot | Laterality: Right

## 2016-12-11 MED ORDER — IRBESARTAN 150 MG PO TABS
75.0000 mg | ORAL_TABLET | Freq: Every day | ORAL | Status: DC
  Administered 2016-12-12 – 2016-12-30 (×18): 75 mg via ORAL
  Filled 2016-12-11 (×18): qty 1

## 2016-12-11 MED ORDER — ONDANSETRON HCL 4 MG/2ML IJ SOLN
INTRAMUSCULAR | Status: AC
  Filled 2016-12-11: qty 2

## 2016-12-11 MED ORDER — ONDANSETRON HCL 4 MG/2ML IJ SOLN
INTRAMUSCULAR | Status: DC | PRN
  Administered 2016-12-11: 4 mg via INTRAVENOUS

## 2016-12-11 MED ORDER — 0.9 % SODIUM CHLORIDE (POUR BTL) OPTIME
TOPICAL | Status: DC | PRN
  Administered 2016-12-11: 1000 mL

## 2016-12-11 MED ORDER — MAGNESIUM SULFATE 2 GM/50ML IV SOLN
2.0000 g | Freq: Every day | INTRAVENOUS | Status: DC | PRN
  Filled 2016-12-11: qty 50

## 2016-12-11 MED ORDER — HYDRALAZINE HCL 20 MG/ML IJ SOLN
5.0000 mg | INTRAMUSCULAR | Status: AC | PRN
  Administered 2016-12-27 – 2016-12-30 (×2): 5 mg via INTRAVENOUS
  Filled 2016-12-11 (×2): qty 1

## 2016-12-11 MED ORDER — FENTANYL CITRATE (PF) 100 MCG/2ML IJ SOLN: 25.0000 ug | INTRAMUSCULAR | Status: DC | PRN

## 2016-12-11 MED ORDER — PROPOFOL 10 MG/ML IV BOLUS
INTRAVENOUS | Status: DC | PRN
  Administered 2016-12-11: 120 mg via INTRAVENOUS

## 2016-12-11 MED ORDER — METOPROLOL TARTRATE 50 MG PO TABS
50.0000 mg | ORAL_TABLET | Freq: Every day | ORAL | Status: DC
Start: 2016-12-12 — End: 2016-12-30
  Administered 2016-12-12 – 2016-12-30 (×18): 50 mg via ORAL
  Filled 2016-12-11 (×20): qty 1

## 2016-12-11 MED ORDER — LIDOCAINE HCL (CARDIAC) 20 MG/ML IV SOLN
INTRAVENOUS | Status: DC | PRN
  Administered 2016-12-11: 30 mg via INTRAVENOUS

## 2016-12-11 MED ORDER — ENOXAPARIN SODIUM 30 MG/0.3ML ~~LOC~~ SOLN
30.0000 mg | SUBCUTANEOUS | Status: DC
  Administered 2016-12-12 – 2016-12-30 (×18): 30 mg via SUBCUTANEOUS
  Filled 2016-12-11 (×19): qty 0.3

## 2016-12-11 MED ORDER — DIAZEPAM 5 MG PO TABS
2.5000 mg | ORAL_TABLET | Freq: Every evening | ORAL | Status: DC | PRN
  Administered 2016-12-11 – 2016-12-28 (×5): 2.5 mg via ORAL
  Filled 2016-12-11 (×6): qty 1

## 2016-12-11 MED ORDER — GUAIFENESIN-DM 100-10 MG/5ML PO SYRP: 15.0000 mL | ORAL_SOLUTION | ORAL | Status: DC | PRN

## 2016-12-11 MED ORDER — EPHEDRINE 5 MG/ML INJ
INTRAVENOUS | Status: AC
  Filled 2016-12-11: qty 10

## 2016-12-11 MED ORDER — LACTATED RINGERS IV SOLN
INTRAVENOUS | Status: DC | PRN
  Administered 2016-12-11: 09:00:00 via INTRAVENOUS

## 2016-12-11 MED ORDER — LOPERAMIDE HCL 2 MG PO CAPS
2.0000 mg | ORAL_CAPSULE | ORAL | Status: DC | PRN
  Filled 2016-12-11: qty 1

## 2016-12-11 MED ORDER — DEXTROSE 5 % IV SOLN
1.5000 g | INTRAVENOUS | Status: AC
  Administered 2016-12-11: 1.5 g via INTRAVENOUS
  Filled 2016-12-11: qty 1.5

## 2016-12-11 MED ORDER — METOPROLOL TARTRATE 25 MG PO TABS
25.0000 mg | ORAL_TABLET | Freq: Every day | ORAL | Status: DC
  Administered 2016-12-11 – 2016-12-29 (×17): 25 mg via ORAL
  Filled 2016-12-11 (×19): qty 1

## 2016-12-11 MED ORDER — PANTOPRAZOLE SODIUM 40 MG PO TBEC
40.0000 mg | DELAYED_RELEASE_TABLET | Freq: Every day | ORAL | Status: DC
  Administered 2016-12-12 – 2016-12-30 (×18): 40 mg via ORAL
  Filled 2016-12-11 (×19): qty 1

## 2016-12-11 MED ORDER — POTASSIUM CHLORIDE CRYS ER 20 MEQ PO TBCR: 20.0000 meq | EXTENDED_RELEASE_TABLET | Freq: Every day | ORAL | Status: DC | PRN

## 2016-12-11 MED ORDER — ACETAMINOPHEN 500 MG PO TABS
1000.0000 mg | ORAL_TABLET | Freq: Two times a day (BID) | ORAL | Status: DC | PRN
  Administered 2016-12-11 – 2016-12-24 (×5): 1000 mg via ORAL
  Filled 2016-12-11 (×6): qty 2

## 2016-12-11 MED ORDER — CHLORHEXIDINE GLUCONATE CLOTH 2 % EX PADS: 6.0000 | MEDICATED_PAD | Freq: Once | CUTANEOUS | Status: DC

## 2016-12-11 MED ORDER — HYDROCHLOROTHIAZIDE 10 MG/ML ORAL SUSPENSION
6.2500 mg | Freq: Every day | ORAL | Status: DC
  Administered 2016-12-12 – 2016-12-14 (×3): 6.25 mg via ORAL
  Filled 2016-12-11 (×5): qty 1.25

## 2016-12-11 MED ORDER — FENTANYL CITRATE (PF) 100 MCG/2ML IJ SOLN
INTRAMUSCULAR | Status: AC
  Administered 2016-12-11: 50 ug
  Filled 2016-12-11: qty 2

## 2016-12-11 MED ORDER — FENTANYL CITRATE (PF) 100 MCG/2ML IJ SOLN
INTRAMUSCULAR | Status: AC
  Filled 2016-12-11: qty 2

## 2016-12-11 MED ORDER — DOCUSATE SODIUM 100 MG PO CAPS
100.0000 mg | ORAL_CAPSULE | Freq: Every day | ORAL | Status: DC
  Administered 2016-12-12 – 2016-12-17 (×6): 100 mg via ORAL
  Filled 2016-12-11 (×6): qty 1

## 2016-12-11 MED ORDER — LIDOCAINE 2% (20 MG/ML) 5 ML SYRINGE
INTRAMUSCULAR | Status: AC
  Filled 2016-12-11: qty 5

## 2016-12-11 MED ORDER — LABETALOL HCL 5 MG/ML IV SOLN
10.0000 mg | INTRAVENOUS | Status: DC | PRN
  Administered 2016-12-19: 10 mg via INTRAVENOUS
  Filled 2016-12-11: qty 4

## 2016-12-11 MED ORDER — DEXTROSE 5 % IV SOLN
1.5000 g | Freq: Two times a day (BID) | INTRAVENOUS | Status: AC
  Administered 2016-12-11 – 2016-12-12 (×2): 1.5 g via INTRAVENOUS
  Filled 2016-12-11 (×2): qty 1.5

## 2016-12-11 MED ORDER — HYDROCODONE-ACETAMINOPHEN 5-325 MG PO TABS
1.0000 | ORAL_TABLET | ORAL | Status: DC | PRN
  Administered 2016-12-11 – 2016-12-12 (×5): 2 via ORAL
  Administered 2016-12-12: 1 via ORAL
  Administered 2016-12-13 (×2): 2 via ORAL
  Filled 2016-12-11 (×10): qty 2

## 2016-12-11 MED ORDER — METOPROLOL TARTRATE 5 MG/5ML IV SOLN: 2.0000 mg | INTRAVENOUS | Status: DC | PRN

## 2016-12-11 MED ORDER — GABAPENTIN 300 MG PO CAPS
600.0000 mg | ORAL_CAPSULE | Freq: Two times a day (BID) | ORAL | Status: DC
  Administered 2016-12-11 – 2016-12-13 (×5): 600 mg via ORAL
  Filled 2016-12-11 (×5): qty 2

## 2016-12-11 MED ORDER — VALSARTAN-HYDROCHLOROTHIAZIDE 160-12.5 MG PO TABS: 0.5000 | ORAL_TABLET | Freq: Every day | ORAL | Status: DC

## 2016-12-11 MED ORDER — SODIUM CHLORIDE 0.9 % IV SOLN: INTRAVENOUS | Status: DC

## 2016-12-11 MED ORDER — PHENOL 1.4 % MT LIQD: 1.0000 | OROMUCOSAL | Status: DC | PRN

## 2016-12-11 MED ORDER — ALUM & MAG HYDROXIDE-SIMETH 200-200-20 MG/5ML PO SUSP: 15.0000 mL | ORAL | Status: DC | PRN

## 2016-12-11 MED ORDER — FENTANYL CITRATE (PF) 100 MCG/2ML IJ SOLN
INTRAMUSCULAR | Status: DC | PRN
  Administered 2016-12-11 (×2): 25 ug via INTRAVENOUS

## 2016-12-11 MED ORDER — CLOPIDOGREL BISULFATE 75 MG PO TABS
75.0000 mg | ORAL_TABLET | Freq: Every day | ORAL | Status: DC
Start: 2016-12-12 — End: 2016-12-30
  Administered 2016-12-12 – 2016-12-30 (×18): 75 mg via ORAL
  Filled 2016-12-11 (×19): qty 1

## 2016-12-11 MED ORDER — FENTANYL CITRATE (PF) 100 MCG/2ML IJ SOLN: 50.0000 ug | Freq: Once | INTRAMUSCULAR | Status: AC

## 2016-12-11 MED ORDER — PROPOFOL 10 MG/ML IV BOLUS
INTRAVENOUS | Status: AC
  Filled 2016-12-11: qty 40

## 2016-12-11 MED ORDER — SODIUM CHLORIDE 0.9 % IV SOLN
INTRAVENOUS | Status: DC
  Administered 2016-12-11: 13:00:00 via INTRAVENOUS

## 2016-12-11 MED ORDER — ONDANSETRON HCL 4 MG/2ML IJ SOLN: 4.0000 mg | Freq: Four times a day (QID) | INTRAMUSCULAR | Status: DC | PRN

## 2016-12-11 SURGICAL SUPPLY — 42 items
BANDAGE ACE 4X5 VEL STRL LF (GAUZE/BANDAGES/DRESSINGS) ×2 IMPLANT
BANDAGE ELASTIC 4 VELCRO ST LF (GAUZE/BANDAGES/DRESSINGS) ×1 IMPLANT
BLADE AVERAGE 25X9 (BLADE) IMPLANT
BLADE SAW SGTL 81X20 HD (BLADE) ×2 IMPLANT
BNDG CONFORM 3 STRL LF (GAUZE/BANDAGES/DRESSINGS) IMPLANT
BNDG GAUZE ELAST 4 BULKY (GAUZE/BANDAGES/DRESSINGS) ×2 IMPLANT
CANISTER SUCTION 2500CC (MISCELLANEOUS) ×2 IMPLANT
COVER SURGICAL LIGHT HANDLE (MISCELLANEOUS) ×2 IMPLANT
DRAPE EXTREMITY T 121X128X90 (DRAPE) ×1 IMPLANT
DRAPE HALF SHEET 40X57 (DRAPES) ×2 IMPLANT
DRSG ADAPTIC 3X8 NADH LF (GAUZE/BANDAGES/DRESSINGS) ×1 IMPLANT
ELECT REM PT RETURN 9FT ADLT (ELECTROSURGICAL) ×2
ELECTRODE REM PT RTRN 9FT ADLT (ELECTROSURGICAL) ×1 IMPLANT
GAUZE SPONGE 4X4 12PLY STRL (GAUZE/BANDAGES/DRESSINGS) ×2 IMPLANT
GLOVE BIOGEL PI IND STRL 6.5 (GLOVE) IMPLANT
GLOVE BIOGEL PI IND STRL 7.0 (GLOVE) IMPLANT
GLOVE BIOGEL PI IND STRL 7.5 (GLOVE) ×1 IMPLANT
GLOVE BIOGEL PI INDICATOR 6.5 (GLOVE) ×1
GLOVE BIOGEL PI INDICATOR 7.0 (GLOVE) ×1
GLOVE BIOGEL PI INDICATOR 7.5 (GLOVE) ×2
GLOVE ECLIPSE 7.0 STRL STRAW (GLOVE) ×1 IMPLANT
GLOVE SURG SS PI 7.5 STRL IVOR (GLOVE) ×2 IMPLANT
GOWN STRL REUS W/ TWL LRG LVL3 (GOWN DISPOSABLE) ×2 IMPLANT
GOWN STRL REUS W/ TWL XL LVL3 (GOWN DISPOSABLE) ×1 IMPLANT
GOWN STRL REUS W/TWL LRG LVL3 (GOWN DISPOSABLE) ×4
GOWN STRL REUS W/TWL XL LVL3 (GOWN DISPOSABLE) ×2
KIT BASIN OR (CUSTOM PROCEDURE TRAY) ×2 IMPLANT
KIT ROOM TURNOVER OR (KITS) ×2 IMPLANT
NDL HYPO 25GX1X1/2 BEV (NEEDLE) IMPLANT
NEEDLE HYPO 25GX1X1/2 BEV (NEEDLE) IMPLANT
NS IRRIG 1000ML POUR BTL (IV SOLUTION) ×2 IMPLANT
PACK GENERAL/GYN (CUSTOM PROCEDURE TRAY) ×2 IMPLANT
PAD ARMBOARD 7.5X6 YLW CONV (MISCELLANEOUS) ×4 IMPLANT
SPECIMEN JAR SMALL (MISCELLANEOUS) ×2 IMPLANT
SPONGE GAUZE 4X4 12PLY STER LF (GAUZE/BANDAGES/DRESSINGS) ×1 IMPLANT
SUT ETHILON 3 0 PS 1 (SUTURE) ×4 IMPLANT
SYR CONTROL 10ML LL (SYRINGE) IMPLANT
TOWEL OR 17X24 6PK STRL BLUE (TOWEL DISPOSABLE) ×2 IMPLANT
TOWEL OR 17X26 10 PK STRL BLUE (TOWEL DISPOSABLE) ×2 IMPLANT
TUBE ANAEROBIC SPECIMEN COL (MISCELLANEOUS) IMPLANT
UNDERPAD 30X30 (UNDERPADS AND DIAPERS) ×2 IMPLANT
WATER STERILE IRR 1000ML POUR (IV SOLUTION) ×2 IMPLANT

## 2016-12-11 NOTE — Brief Op Note (Signed)
12/11/2016  10:24 PM  PATIENT:  [name]  83 y.o. female  PRE-OPERATIVE DIAGNOSIS:  Ischemic right great toe, non viable tissue  POST-OPERATIVE DIAGNOSIS:  Ischemic right great toe, non viable tissue  PROCEDURE:  Procedure(s): TRANSMETATARSAL AMPUTATION (Right)  SURGEON:  Surgeon(s) and Role:    Serafina Mitchell, MD - Primary  PHYSICIAN ASSISTANT:   ASSISTANTS: Lennie Muckle   ANESTHESIA:   general  EBL:  No intake/output data recorded.  BLOOD ADMINISTERED:none  DRAINS: none   LOCAL MEDICATIONS USED:  NONE  SPECIMEN:  No Specimen  DISPOSITION OF SPECIMEN:  N/A  COUNTS:  YES  TOURNIQUET:  * No tourniquets in log *  DICTATION: .Dragon Dictation  PLAN OF CARE: Admit to inpatient   PATIENT DISPOSITION:  PACU - hemodynamically stable.   Delay start of Pharmacological VTE agent (>24hrs) due to surgical blood loss or risk of bleeding: yes

## 2016-12-11 NOTE — Progress Notes (Signed)
Patient arrived form PACU, assessment completed see flowsheet, placed on tele ccmd notified, patient oriented to room and staff, bed in lowest position, call bell within reach, will continue to monitor

## 2016-12-11 NOTE — Anesthesia Preprocedure Evaluation (Signed)
Anesthesia Evaluation  Patient identified by MRN, date of birth, ID band Patient awake    Reviewed: Allergy & Precautions, NPO status , Patient's Chart, lab work & pertinent test results  Airway Mallampati: II  TM Distance: >3 FB Neck ROM: Full    Dental no notable dental hx.    Pulmonary neg pulmonary ROS, former smoker,    Pulmonary exam normal breath sounds clear to auscultation       Cardiovascular hypertension, + Peripheral Vascular Disease  Normal cardiovascular exam+ dysrhythmias Atrial Fibrillation  Rhythm:Regular Rate:Normal     Neuro/Psych Anxiety TIACVA    GI/Hepatic negative GI ROS, Neg liver ROS, GERD  ,  Endo/Other  negative endocrine ROS  Renal/GU negative Renal ROS  negative genitourinary   Musculoskeletal negative musculoskeletal ROS (+)   Abdominal   Peds negative pediatric ROS (+)  Hematology negative hematology ROS (+)   Anesthesia Other Findings   Reproductive/Obstetrics negative OB ROS                             Anesthesia Physical Anesthesia Plan  ASA: III  Anesthesia Plan: General   Post-op Pain Management:    Induction: Intravenous  Airway Management Planned: LMA  Additional Equipment:   Intra-op Plan:   Post-operative Plan: Extubation in OR  Informed Consent: I have reviewed the patients History and Physical, chart, labs and discussed the procedure including the risks, benefits and alternatives for the proposed anesthesia with the patient or authorized representative who has indicated his/her understanding and acceptance.   Dental advisory given  Plan Discussed with: CRNA and Surgeon  Anesthesia Plan Comments:         Anesthesia Quick Evaluation

## 2016-12-11 NOTE — Anesthesia Postprocedure Evaluation (Addendum)
Anesthesia Post Note  Patient: [name]  Procedure(s) Performed: Procedure(s) (LRB): TRANSMETATARSAL AMPUTATION (Right)  Patient location during evaluation: PACU Anesthesia Type: General Level of consciousness: awake and alert Pain management: pain level controlled Vital Signs Assessment: post-procedure vital signs reviewed and stable Respiratory status: spontaneous breathing, nonlabored ventilation, respiratory function stable and patient connected to nasal cannula oxygen Cardiovascular status: blood pressure returned to baseline and stable Postop Assessment: no signs of nausea or vomiting Anesthetic complications: no       Last Vitals:  Vitals:   12/11/16 1042 12/11/16 1100  BP: (!) 138/53 (!) 128/57  Pulse: 73 74  Resp: 13 11  Temp:      Last Pain:  Vitals:   12/11/16 1100  TempSrc:   PainSc: 0-No pain        RLE Motor Response: Purposeful movement;Responds to commands (12/11/16 1100) RLE Sensation: Decreased (due to block) (12/11/16 1100)      Makyle Eslick S

## 2016-12-11 NOTE — Progress Notes (Signed)
Patient had some bleeding from her surgical site while getting up to use the bedside commode,  site was marked for  closer monitoring. Patient c/o of severe pain in her right leg PRN Norco given , patient verbalised no relief , Chen MD notified of the bleeding from the surgical site and the pain voiced by patient. Patient is allergic to morphine( has severe hallucinations) and so MD could not order Morphine, no new orders received, patient given PRN acetaminophen, right leg was repositioned, patient verbalised some relief, patient told to notify nurse if pain gets worse, daughter at bedside will continue to monitor

## 2016-12-11 NOTE — H&P (View-Only) (Signed)
[facility] [place]  Patient name: [name] MRN: [id number] DOB: Jun 07, 1928 Sex: female  REASON FOR VISIT: add-on   HPI: [name] is a 83 y.o. female who presents with worsening right great toe wound and pain. She states that her pain is intolerable and she is unable to sleep. She was last seen in the office by [name] on 10/27/16 where toe amputation was discussed if her right great toe wound worsened.   She has a complicated past vascular history. She previously underwent angioplasty and stenting of her right superficial femoral artery on 09/05/16. This was done for occlusion. On 09/24/2016 she went to the operating room for a left femoral to above-knee popliteal artery bypass graft with 6 mm propatent PTFE.  The distal anastomosis was to the previous vein bypass graft which was somewhat sclerotic.  On postoperative day 2 the graft was occluded and she developed severe pain.  She went back urgently to the operating room and had subintimal angioplasty and subsequent stenting using Viabahn stents.  She also had angioplasty of anterior tibial artery.  In the recovery room she did not have a pulse in her right groin and therefore she went back for exploration and ended up with a femoral endarterectomy with patch angioplasty.  While in the hospital she developed atrial fibrillation with rapid ventricular response.  She converted with metoprolol. She was started on Eliquis, in addition to her Plavix.  Unfortunately, she developed a GI bleed requiring admission to the hospital.  Her Eliquis has been discontinued. She is currently on Plavix and ASA only.   Past Medical History:  Diagnosis Date  . Anemia   . Anginal pain ([place])   . Arthritis    "qwhere" (09/05/2016)  . Chronic lower back pain   . Complication of anesthesia    "takes a long time for it to wear off; I can hallucinate if I take too much" (09/05/2016)  . DVT (deep venous thrombosis) ([place])  10/2009  . Fall from steps 08/31/2013   Fx. pelvis, Left Hip, Left Elbow  . Fibromyalgia   . GERD (gastroesophageal reflux disease)    09/22/16- "no too much anymore"  . GI bleed 10/24/2016  . High cholesterol   . History of hiatal hernia   . Hypertension   . Macular degeneration, wet ([place])    "started in right eye; now legally blind in that eye; now started in left eye but pretty much in control" (09/05/2016)  . Osteoporosis   . Peripheral vascular disease ([place])   . Squamous cell carcinoma of skin of right calf Aug. 2015  . Stroke ([place])    TIA's no residual  . TIA (transient ischemic attack)    "several at once; none in a long time" (09/05/2016)    Family History  Problem Relation Age of Onset  . Heart disease Father     Heart Disease before age 13  . Hyperlipidemia Father   . Hypertension Father   . Alcohol abuse Father   . Heart disease Brother   . Hyperlipidemia Brother   . Hypertension Brother   . Deep vein thrombosis Brother   . Heart disease Son     Heart Disease before age 58  . Hyperlipidemia Son   . Hypertension Son   . Heart attack Son   . Diabetes Son   . Hypertension Son   . Hyperlipidemia Sister   . Hypertension Sister     SOCIAL HISTORY: Social History  Substance Use Topics  . Smoking status: Former Smoker    Types: Cigarettes    Quit date: 11/17/1946  . Smokeless tobacco: Never Used     Comment: "never smoked much"  . Alcohol use No    Allergies  Allergen Reactions  . Motrin [Ibuprofen] Other (See Comments)    ADVERSE REACTION - GI BLEED  . Morphine And Related Other (See Comments)    HALLUCINATIONS REACTION IS SIDE EFFECT  . Oxycontin [Oxycodone Hcl] Other (See Comments)    [REACTION IS SIDE EFFECT]  Hallinatations  . Statins Other (See Comments)    ADVERSE REACTION MUSCLE PAIN & WEAKNESS  . Promethazine Hcl Other (See Comments)    IV  Drug only, makes her act crazy  . Sulfa Antibiotics Nausea And Vomiting    Current Outpatient  Prescriptions  Medication Sig Dispense Refill  . acetaminophen (TYLENOL) 500 MG tablet Take 1,000 mg by mouth 2 (two) times daily as needed for moderate pain or headache. Patient took this medication for her pain.    . Cholecalciferol (VITAMIN D3) 2000 UNITS TABS Take 2,000 Units by mouth at bedtime.     . clopidogrel (PLAVIX) 75 MG tablet Take 75 mg by mouth daily.    . Cyanocobalamin (VITAMIN B-12 PO) Take 1 tablet by mouth daily.    . diazepam (VALIUM) 5 MG tablet Take 2.5 mg by mouth at bedtime as needed for anxiety.     . diclofenac sodium (VOLTAREN) 1 % GEL Apply 2 g topically 4 (four) times daily. 1 Tube 2  . gabapentin (NEURONTIN) 300 MG capsule Take 2 capsules (600 mg total) by mouth 2 (two) times daily. 120 capsule 3  . HYDROcodone-acetaminophen (NORCO/VICODIN) 5-325 MG tablet Take 1 tablet by mouth every 6 (six) hours as needed. 20 tablet 0  . loperamide (IMODIUM) 2 MG capsule Take 2 mg by mouth as needed for diarrhea or loose stools.    . metoprolol (LOPRESSOR) 50 MG tablet Take 1 tablet (50 mg total) by mouth 2 (two) times daily. Take 50mg  by mouth in the morning and take 25mg  by mouth at bedtime. 60 tablet 0  . Multiple Vitamins-Minerals (EYE VITAMINS PO) Take 1 tablet by mouth at bedtime.     . nitroGLYCERIN (NITRO-DUR) 0.2 mg/hr patch Place 1 patch (0.2 mg total) onto the skin daily. 5 patch 12  . pantoprazole (PROTONIX) 40 MG tablet Take 1 tablet (40 mg total) by mouth daily. 30 tablet 1  . traMADol (ULTRAM) 50 MG tablet Take 50 mg by mouth at bedtime as needed for moderate pain.     . valsartan-hydrochlorothiazide (DIOVAN-HCT) 160-12.5 MG per tablet Take 0.5 tablets by mouth daily.     No current facility-administered medications for this visit.     REVIEW OF SYSTEMS:  [X]  denotes positive finding, [ ]  denotes negative finding Cardiac  Comments:  Chest pain or chest pressure:    Shortness of breath upon exertion:    Short of breath when lying flat:    Irregular heart  rhythm:        Vascular    Pain in calf, thigh, or hip brought on by ambulation:    Pain in feet at night that wakes you up from your sleep:     Blood clot in your veins:    Leg swelling:         Pulmonary    Oxygen at home:    Productive cough:     Wheezing:  Neurologic    Sudden weakness in arms or legs:     Sudden numbness in arms or legs:     Sudden onset of difficulty speaking or slurred speech:    Temporary loss of vision in one eye:     Problems with dizziness:         Gastrointestinal    Blood in stool:     Vomited blood:         Genitourinary    Burning when urinating:     Blood in urine:        Psychiatric    Major depression:         Hematologic    Bleeding problems:    Problems with blood clotting too easily:        Skin    Rashes or ulcers: x Right great toe wound      Constitutional    Fever or chills:      PHYSICAL EXAM: Vitals:   12/01/16 1420  BP: (!) 153/75  Pulse: 67  Resp: 18  Temp: 97.4 F (36.3 C)  TempSrc: Oral  SpO2: 94%  Weight: 128 lb 6.4 oz (58.2 kg)  Height: 5\' 2"  (1.575 m)    GENERAL: The patient is a well-nourished female, in no acute distress. The vital signs are documented above. CARDIAC: There is a regular rate and rhythm. No carotid bruits. VASCULAR: Palpable left dorsalis pedis pulse. Audible doppler flow right DP and at medial 1st MTP. Right great toe is red/purple with wound to medial aspect. No drainage seen. Red/purple discoloration to distal half of foot on the plantar aspect. Very tender to palpation right great toe and distal foot.  PULMONARY: There is good air exchange bilaterally without wheezing or rales.  NEUROLOGIC: Motor and sensory function intact right foot.  SKIN: There are no ulcers or rashes noted. PSYCHIATRIC: The patient has a normal affect.   MEDICAL ISSUES: Non healing right great toe wound  The patient's right toe wound has worsened and her pain has become intolerable. The patient does  have some ischemic changes involving the distal half of the right foot as well. Discussed proceeding with right great toe amputation versus transmetatarsal amputation. The patient and daughter have elected to proceed with right transmetatarsal amputation. They understand that this may not heal and require more proximal amputation. This will be scheduled for 12/11/16 with [name] per patient preference. The patient understands that if she is unable to withstand her pain until then, then other partners are available for her surgery. She is no longer on Eliquis, only ASA and Plavix.   Hydrocodone 5 mg # 30 prescribed Keflex also prescribed for infection prophylaxis per patient request  [name], PA-C [facility] MD: Trula Slade

## 2016-12-11 NOTE — Anesthesia Procedure Notes (Signed)
Procedure Name: LMA Insertion Date/Time: 12/11/2016 9:33 AM Performed by: Eligha Bridegroom Pre-anesthesia Checklist: Patient identified, Emergency Drugs available, Suction available, Patient being monitored and Timeout performed Patient Re-evaluated:Patient Re-evaluated prior to inductionOxygen Delivery Method: Circle system utilized Preoxygenation: Pre-oxygenation with 100% oxygen Intubation Type: IV induction LMA: LMA inserted LMA Size: 4.0 Number of attempts: 1 Placement Confirmation: positive ETCO2 and breath sounds checked- equal and bilateral Dental Injury: Teeth and Oropharynx as per pre-operative assessment

## 2016-12-11 NOTE — Anesthesia Procedure Notes (Addendum)
Anesthesia Regional Block:  Ankle block  Pre-Anesthetic Checklist: ,, timeout performed, Correct Patient, Correct Site, Correct Laterality, Correct Procedure, Correct Position, site marked, Risks and benefits discussed,  Surgical consent,  Pre-op evaluation,  At surgeon's request and post-op pain management  Laterality: Right  Prep: chloraprep       Needles:  Injection technique: Single-shot  Needle Type: Other      Needle Gauge: 25 and 25 G    Additional Needles: Ankle block Narrative:  Start time: 12/11/2016 9:30 AM End time: 12/11/2016 9:35 AM Injection made incrementally with aspirations every 5 mL.  Performed by: Personally  Anesthesiologist: Keziyah Kneale  Additional Notes: Patient tolerated the procedure well without complications

## 2016-12-11 NOTE — Interval H&P Note (Signed)
History and Physical Interval Note:  12/11/2016 9:04 AM  [name]  has presented today for surgery, with the diagnosis of Ischemic right great toe, non viable tissue  The various methods of treatment have been discussed with the patient and family. After consideration of risks, benefits and other options for treatment, the patient has consented to  Procedure(s): TRANSMETATARSAL AMPUTATION (Right) as a surgical intervention .  The patient's history has been reviewed, patient examined, no change in status, stable for surgery.  I have reviewed the patient's chart and labs.  Questions were answered to the patient's satisfaction.     [name]

## 2016-12-11 NOTE — Transfer of Care (Signed)
Immediate Anesthesia Transfer of Care Note  Patient: [name]  Procedure(s) Performed: Procedure(s): TRANSMETATARSAL AMPUTATION (Right)  Patient Location: PACU  Anesthesia Type:General  Level of Consciousness: awake, alert  and oriented  Airway & Oxygen Therapy: Patient Spontanous Breathing and Patient connected to nasal cannula oxygen  Post-op Assessment: Report given to RN and Post -op Vital signs reviewed and stable  Post vital signs: Reviewed and stable  Last Vitals:  Vitals:   12/11/16 0719 12/11/16 1027  BP: (!) 161/53 (!) 142/54  Pulse: 67 75  Resp: 16 14  Temp: 37.1 C 36.4 C    Last Pain:  Vitals:   12/11/16 1027  TempSrc:   PainSc: 0-No pain      Patients Stated Pain Goal: 2 ([id number])  Complications: No apparent anesthesia complications

## 2016-12-11 NOTE — Progress Notes (Signed)
Physical medicine rehabilitation consult requested chart reviewed. Status post right BKA 12/11/2016. Await physical and occupational therapy evaluations follow up with appropriate rehabilitation recommendations.

## 2016-12-12 ENCOUNTER — Encounter (HOSPITAL_COMMUNITY): Payer: Self-pay | Admitting: Surgery

## 2016-12-12 DIAGNOSIS — Z89431 Acquired absence of right foot: Secondary | ICD-10-CM

## 2016-12-12 DIAGNOSIS — G629 Polyneuropathy, unspecified: Secondary | ICD-10-CM

## 2016-12-12 LAB — BASIC METABOLIC PANEL
ANION GAP: 11 (ref 5–15)
BUN: 17 mg/dL (ref 6–20)
CALCIUM: 8.9 mg/dL (ref 8.9–10.3)
CHLORIDE: 102 mmol/L (ref 101–111)
CO2: 23 mmol/L (ref 22–32)
CREATININE: 1.07 mg/dL — AB (ref 0.44–1.00)
GFR calc non Af Amer: 46 mL/min — ABNORMAL LOW (ref >60)
GFR, EST AFRICAN AMERICAN: 53 mL/min — AB (ref >60)
Glucose, Bld: 99 mg/dL (ref 65–99)
Potassium: 3.9 mmol/L (ref 3.5–5.1)
SODIUM: 136 mmol/L (ref 135–145)

## 2016-12-12 LAB — CBC
HCT: 31.8 % — ABNORMAL LOW (ref 36.0–46.0)
HEMOGLOBIN: 10.1 g/dL — AB (ref 12.0–15.0)
MCH: 29.3 pg (ref 26.0–34.0)
MCHC: 31.8 g/dL (ref 30.0–36.0)
MCV: 92.2 fL (ref 78.0–100.0)
PLATELETS: 219 10*3/uL (ref 150–400)
RBC: 3.45 MIL/uL — AB (ref 3.87–5.11)
RDW: 15.1 % (ref 11.5–15.5)
WBC: 5.9 10*3/uL (ref 4.0–10.5)

## 2016-12-12 MED ORDER — KETOROLAC TROMETHAMINE 30 MG/ML IJ SOLN
30.0000 mg | Freq: Four times a day (QID) | INTRAMUSCULAR | Status: DC | PRN
  Administered 2016-12-12: 30 mg via INTRAVENOUS
  Filled 2016-12-12: qty 1

## 2016-12-12 MED ORDER — HYDROMORPHONE HCL 1 MG/ML IJ SOLN
0.5000 mg | INTRAMUSCULAR | Status: DC | PRN
Start: 2016-12-12 — End: 2016-12-20
  Administered 2016-12-12 – 2016-12-20 (×27): 0.5 mg via INTRAVENOUS
  Filled 2016-12-12 (×28): qty 1

## 2016-12-12 NOTE — Consult Note (Signed)
Physical Medicine and Rehabilitation Consult Reason for Consult: Right transmetatarsal amputation Referring Physician: [name]   HPI: [name] is a 83 y.o. right handed female with history of atrial fibrillation maintained on Plavix and aspirin, GI bleed, chronic low back pain, fibromyalgia, CRI stage III creatinine 1.07-1.19, hypertension, peripheral vascular disease with history of femoral-popliteal bypass grafting 09/24/2016. Per chart review patient lives with spouse. Used a cane and/or walker when needed. One level home with. Daughter can assist. Presented 12/10/2016 with chronic right toe wound with increasing pain and ischemic changes followed by vascular surgery. No change with conservative care and underwent right transmetatarsal amputation 12/11/2016 per [name]. [facility] course pain management. Subcutaneous Lovenox for DVT prophylaxis. Acute on chronic anemia 10.1 and monitored. Physical and occupational therapy evaluations pending. Pt unable to mobilize yet due to significant bleeding from the incision.  M.D. has requested physical medicine rehabilitation consult.   Review of Systems  Constitutional: Negative for chills and fever.  HENT: Negative for hearing loss and tinnitus.   Eyes: Negative for blurred vision and double vision.  Respiratory: Negative for cough and shortness of breath.   Cardiovascular: Positive for palpitations and leg swelling. Negative for chest pain.  Gastrointestinal: Positive for constipation and nausea. Negative for vomiting.       GERD  Genitourinary: Negative for dysuria, flank pain and hematuria.  Musculoskeletal: Positive for back pain, joint pain and myalgias.  Skin: Negative for rash.  Neurological: Negative for dizziness, speech change and headaches.  Psychiatric/Behavioral: The patient has insomnia.   All other systems reviewed and are negative.  Past Medical History:  Diagnosis Date  . Anemia   . Anginal pain ([place])     . Arthritis    "qwhere" (09/05/2016)  . Atrial fibrillation ([place]) 09/2016  . Chronic lower back pain   . Complication of anesthesia    "takes a long time for it to wear off; I can hallucinate if I take too much" (09/05/2016)  . DVT (deep venous thrombosis) ([place]) 10/2009  . Fall from steps 08/31/2013   Fx. pelvis, Left Hip, Left Elbow  . Fibromyalgia   . GERD (gastroesophageal reflux disease)    09/22/16- "no too much anymore"  . GI bleed 10/24/2016  . High cholesterol   . History of blood transfusion   . History of hiatal hernia   . Hypertension   . Macular degeneration, wet ([place])    "started in right eye; now legally blind in that eye; now started in left eye but pretty much in control" (09/05/2016)  . Osteoporosis   . Peripheral vascular disease ([place])    nonviable tissue Right foot  . PONV (postoperative nausea and vomiting)   . Squamous cell carcinoma of skin of right calf Aug. 2015  . Stroke ([place])    TIA's no residual  . TIA (transient ischemic attack)    "several at once; none in a long time" (09/05/2016)   Past Surgical History:  Procedure Laterality Date  . ABDOMINAL AORTAGRAM N/A 12/26/2014   Procedure: ABDOMINAL Maxcine Ham;  Surgeon: Serafina Mitchell, MD;  Location: [facility] C[facility] LAB;  Service: Cardiovascular;  Laterality: N/A;  . AORTOGRAM N/A 09/26/2016   Procedure: AORTOGRAM;  Surgeon: Waynetta Sandy, MD;  Location: [place];  Service: Vascular;  Laterality: N/A;  . CARPAL TUNNEL RELEASE Right   . CATARACT EXTRACTION W/ INTRAOCULAR LENS  IMPLANT, BILATERAL Bilateral   . COLONOSCOPY    . DILATION AND CURETTAGE OF UTERUS    .  EYE SURGERY Right    "macular OR"  . FEMORAL ARTERY STENT  12-11-10   Left SFA  . FEMORAL-POPLITEAL BYPASS GRAFT Left 09/24/2016   Procedure: REDO FEMORAL TO POPLITEAL ARTERY BYPASS GRAFT USING 6MM PROPATEN RINGED GORTEX GRAFT;  Surgeon: Serafina Mitchell, MD;  Location: [place];  Service: Vascular;  Laterality: Left;  . INCISION AND DRAINAGE OF  WOUND Left 10/25/2009   leg/notes 11/13/2009  . INSERTION OF ILIAC STENT Left 12/26/2014   Procedure: INSERTION OF ILIAC STENT;  Surgeon: Serafina Mitchell, MD;  Location: [facility] CATH LAB;  Service: Cardiovascular;  Laterality: Left;  . INSERTION OF ILIAC STENT Left 09/26/2016   Procedure: SUB INTIMAL INSERTION OF SUPERFICIAL FEMORAL ARTERY AND BELOW KNEE BYPASS GRAFT;  Surgeon: Waynetta Sandy, MD;  Location: [place];  Service: Vascular;  Laterality: Left;  . JOINT REPLACEMENT     knee  . JOINT REPLACEMENT Left Oct. 17, 2014   Elbow ( pt fell 08-31-13 )  . ORIF SHOULDER FRACTURE Right    "it was crushed"  . PERIPHERAL VASCULAR CATHETERIZATION N/A 10/30/2015   Procedure: Abdominal Aortogram w/Lower Extremity;  Surgeon: Serafina Mitchell, MD;  Location: [place] CV LAB;  Service: Cardiovascular;  Laterality: N/A;  . PERIPHERAL VASCULAR CATHETERIZATION  10/30/2015   Procedure: Peripheral Vascular Intervention;  Surgeon: Serafina Mitchell, MD;  Location: [place] CV LAB;  Service: Cardiovascular;;  . PERIPHERAL VASCULAR CATHETERIZATION N/A 04/01/2016   Procedure: Abdominal Aortogram w/Lower Extremity;  Surgeon: Serafina Mitchell, MD;  Location: [place] CV LAB;  Service: Cardiovascular;  Laterality: N/A;  . PERIPHERAL VASCULAR CATHETERIZATION Left 04/01/2016   Procedure: Peripheral Vascular Atherectomy;  Surgeon: Serafina Mitchell, MD;  Location: [place] CV LAB;  Service: Cardiovascular;  Laterality: Left;  Superficial femoral artery.  Marland Kitchen PERIPHERAL VASCULAR CATHETERIZATION Right 09/05/2016   "stent"  . PERIPHERAL VASCULAR CATHETERIZATION N/A 09/05/2016   Procedure: Abdominal Aortogram w/Lower Extremity;  Surgeon: Serafina Mitchell, MD;  Location: [place] CV LAB;  Service: Cardiovascular;  Laterality: N/A;  . PERIPHERAL VASCULAR CATHETERIZATION Right 09/05/2016   Procedure: Peripheral Vascular Intervention;  Surgeon: Serafina Mitchell, MD;  Location: [place] CV LAB;  Service:  Cardiovascular;  Laterality: Right;  Superficial Femoral  . PERIPHERAL VASCULAR CATHETERIZATION Left 09/09/2016   Procedure: Lower Extremity Angiography;  Surgeon: Serafina Mitchell, MD;  Location: [place] CV LAB;  Service: Cardiovascular;  Laterality: Left;  . PR VEIN BYPASS GRAFT,AORTO-FEM-POP  09-13-09   Left Fem-pop  . THROMBECTOMY FEMORAL ARTERY Right 09/26/2016   Procedure: Thromboembolectomy Right Lower Extremity, Right Femoral Artery Endarterectomy with Patch Angioplasty; Right Lower Extremity Angiogram ;  Surgeon: Waynetta Sandy, MD;  Location: [place];  Service: Vascular;  Laterality: Right;  . TOTAL ELBOW ARTHROPLASTY Left 09/03/2013   Procedure: LEFT TOTAL ELBOW ARTHROPLASTY;  Surgeon: Roseanne Kaufman, MD;  Location: [place];  Service: Orthopedics;  Laterality: Left;  . TOTAL KNEE ARTHROPLASTY Left 06/2006  . TUBAL LIGATION    . VAGINAL HYSTERECTOMY     Family History  Problem Relation Age of Onset  . Heart disease Father     Heart Disease before age 54  . Hyperlipidemia Father   . Hypertension Father   . Alcohol abuse Father   . Heart disease Brother   . Hyperlipidemia Brother   . Hypertension Brother   . Deep vein thrombosis Brother   . Heart disease Son     Heart Disease before age 41  . Hyperlipidemia Son   .  Hypertension Son   . Heart attack Son   . Diabetes Son   . Hypertension Son   . Hyperlipidemia Sister   . Hypertension Sister    Social History:  reports that she quit smoking about 70 years ago. Her smoking use included Cigarettes. She has never used smokeless tobacco. She reports that she does not drink alcohol or use drugs. Allergies:  Allergies  Allergen Reactions  . Motrin [Ibuprofen] Other (See Comments)    ADVERSE REACTION - GI BLEED  . Morphine And Related Other (See Comments)    HALLUCINATIONS REACTION IS SIDE EFFECT  . Oxycontin [Oxycodone Hcl] Other (See Comments)    [REACTION IS SIDE EFFECT]  Hallinatations  . Statins Other (See  Comments)    ADVERSE REACTION MUSCLE PAIN & WEAKNESS  . Promethazine Hcl Other (See Comments)    IV  Drug only, makes her act crazy  . Sulfa Antibiotics Nausea And Vomiting   Medications Prior to Admission  Medication Sig Dispense Refill  . acetaminophen (TYLENOL) 500 MG tablet Take 1,000 mg by mouth 2 (two) times daily as needed for moderate pain or headache. Patient took this medication for her pain.    . cephALEXin (KEFLEX) 500 MG capsule Take 1 capsule (500 mg total) by mouth 3 (three) times daily. 30 capsule 0  . Cholecalciferol (VITAMIN D3) 2000 UNITS TABS Take 2,000 Units by mouth at bedtime.     . clopidogrel (PLAVIX) 75 MG tablet Take 75 mg by mouth daily.    . Cyanocobalamin (VITAMIN B-12 PO) Take 1 tablet by mouth daily.    . diazepam (VALIUM) 5 MG tablet Take 2.5 mg by mouth at bedtime as needed for anxiety.     . gabapentin (NEURONTIN) 300 MG capsule Take 2 capsules (600 mg total) by mouth 2 (two) times daily. 120 capsule 3  . HYDROcodone-acetaminophen (NORCO/VICODIN) 5-325 MG tablet Take 1-2 tablets by mouth every 4 (four) hours as needed for moderate pain. 30 tablet 0  . metoprolol (LOPRESSOR) 50 MG tablet Take 1 tablet (50 mg total) by mouth 2 (two) times daily. Take 50mg  by mouth in the morning and take 25mg  by mouth at bedtime. (Patient taking differently: Take 50 mg by mouth 2 (two) times daily. ) 60 tablet 0  . Multiple Vitamins-Minerals (EYE VITAMINS PO) Take 1 tablet by mouth at bedtime.     . pantoprazole (PROTONIX) 40 MG tablet Take 1 tablet (40 mg total) by mouth daily. 30 tablet 1  . valsartan-hydrochlorothiazide (DIOVAN-HCT) 160-12.5 MG per tablet Take 0.5 tablets by mouth daily.    Marland Kitchen loperamide (IMODIUM) 2 MG capsule Take 2 mg by mouth as needed for diarrhea or loose stools.      Home: Home Living Living Arrangements: Children  Functional History:   Functional Status:  Mobility:          ADL:    Cognition: Cognition Orientation Level: Oriented X4     Blood pressure (!) 149/49, pulse 64, temperature 97.7 F (36.5 C), temperature source Oral, resp. rate 17, SpO2 95 %. Physical Exam  Vitals reviewed. Constitutional: She is oriented to person, place, and time.  HENT:  Head: Normocephalic.  Eyes: EOM are normal.  Neck: Normal range of motion. Neck supple. No thyromegaly present.  Cardiovascular:  Cardiac rate controlled  Respiratory: Effort normal and breath sounds normal. No respiratory distress.  GI: Soft. Bowel sounds are normal. She exhibits no distension.  Musculoskeletal:  Arthritic changes in both hands.   Neurological: She is oriented to  person, place, and time. No cranial nerve deficit.  Mood is a bit flat but appropriate. She makes good eye contact with examiner. Intrinsic minus left foot. Stocking glove sensory loss up to knees and to a lesser extent in the hands. UE grossly 4-5/5 with some weakness in both hands. LLE grossly 3-4/5  Skin:  Right transmetatarsal amputation site is dressed appropriately tender  Psychiatric: She has a normal mood and affect. Her behavior is normal. Judgment and thought content normal.    Results for orders placed or performed during the hospital encounter of 12/11/16 (from the past 24 hour(s))  Basic metabolic panel     Status: Abnormal   Collection Time: 12/11/16  7:37 AM  Result Value Ref Range   Sodium 135 135 - 145 mmol/L   Potassium 4.0 3.5 - 5.1 mmol/L   Chloride 99 (L) 101 - 111 mmol/L   CO2 25 22 - 32 mmol/L   Glucose, Bld 114 (H) 65 - 99 mg/dL   BUN 19 6 - 20 mg/dL   Creatinine, Ser 1.19 (H) 0.44 - 1.00 mg/dL   Calcium 9.7 8.9 - 10.3 mg/dL   GFR calc non Af Amer 40 (L) >60 mL/min   GFR calc Af Amer 47 (L) >60 mL/min   Anion gap 11 5 - 15  CBC     Status: Abnormal   Collection Time: 12/11/16  7:37 AM  Result Value Ref Range   WBC 7.5 4.0 - 10.5 K/uL   RBC 4.00 3.87 - 5.11 MIL/uL   Hemoglobin 11.9 (L) 12.0 - 15.0 g/dL   HCT 36.8 36.0 - 46.0 %   MCV 92.0 78.0 - 100.0 fL    MCH 29.8 26.0 - 34.0 pg   MCHC 32.3 30.0 - 36.0 g/dL   RDW 15.5 11.5 - 15.5 %   Platelets 274 150 - 400 K/uL  CBC     Status: Abnormal   Collection Time: 12/11/16 11:03 AM  Result Value Ref Range   WBC 7.2 4.0 - 10.5 K/uL   RBC 3.77 (L) 3.87 - 5.11 MIL/uL   Hemoglobin 11.1 (L) 12.0 - 15.0 g/dL   HCT 34.6 (L) 36.0 - 46.0 %   MCV 91.8 78.0 - 100.0 fL   MCH 29.4 26.0 - 34.0 pg   MCHC 32.1 30.0 - 36.0 g/dL   RDW 15.1 11.5 - 15.5 %   Platelets 245 150 - 400 K/uL  Creatinine, serum     Status: Abnormal   Collection Time: 12/11/16 11:03 AM  Result Value Ref Range   Creatinine, Ser 1.19 (H) 0.44 - 1.00 mg/dL   GFR calc non Af Amer 40 (L) >60 mL/min   GFR calc Af Amer 47 (L) >60 mL/min  Basic metabolic panel     Status: Abnormal   Collection Time: 12/12/16  2:42 AM  Result Value Ref Range   Sodium 136 135 - 145 mmol/L   Potassium 3.9 3.5 - 5.1 mmol/L   Chloride 102 101 - 111 mmol/L   CO2 23 22 - 32 mmol/L   Glucose, Bld 99 65 - 99 mg/dL   BUN 17 6 - 20 mg/dL   Creatinine, Ser 1.07 (H) 0.44 - 1.00 mg/dL   Calcium 8.9 8.9 - 10.3 mg/dL   GFR calc non Af Amer 46 (L) >60 mL/min   GFR calc Af Amer 53 (L) >60 mL/min   Anion gap 11 5 - 15  CBC     Status: Abnormal   Collection Time: 12/12/16  2:42  AM  Result Value Ref Range   WBC 5.9 4.0 - 10.5 K/uL   RBC 3.45 (L) 3.87 - 5.11 MIL/uL   Hemoglobin 10.1 (L) 12.0 - 15.0 g/dL   HCT 31.8 (L) 36.0 - 46.0 %   MCV 92.2 78.0 - 100.0 fL   MCH 29.3 26.0 - 34.0 pg   MCHC 31.8 30.0 - 36.0 g/dL   RDW 15.1 11.5 - 15.5 %   Platelets 219 150 - 400 K/uL   No results found.  Assessment/Plan: Diagnosis: Right transmetatarsal amputation, hx of severe peripheral neuropathy 1. Does the need for close, 24 hr/day medical supervision in concert with the patient's rehab needs make it unreasonable for this patient to be served in a less intensive setting? Potentially 2. Co-Morbidities requiring supervision/potential complications: PAD, wound care issues,  paroxysmal afib, htn 3. Due to bladder management, bowel management, safety, skin/wound care, disease management, medication administration, pain management and patient education, does the patient require 24 hr/day rehab nursing? Yes 4. Does the patient require coordinated care of a physician, rehab nurse, PT (1-2 hrs/day, 5 days/week) and OT (1-2 hrs/day, 5 days/week) to address physical and functional deficits in the context of the above medical diagnosis(es)? Yes Addressing deficits in the following areas: balance, endurance, locomotion, strength, transferring, bowel/bladder control, bathing, dressing, feeding, grooming, toileting and psychosocial support 5. Can the patient actively participate in an intensive therapy program of at least 3 hrs of therapy per day at least 5 days per week? Potentially 6. The potential for patient to make measurable gains while on inpatient rehab is excellent 7. Anticipated functional outcomes upon discharge from inpatient rehab are modified independent  with PT, modified independent and supervision with OT, n/a with SLP. 8. Estimated rehab length of stay to reach the above functional goals is: 7-10 days potentially 9. Does the patient have adequate social supports and living environment to accommodate these discharge functional goals? Yes 10. Anticipated D/C setting: Home 11. Anticipated post D/C treatments: [place] therapy 12. Overall Rehab/Functional Prognosis: excellent  RECOMMENDATIONS: This patient's condition is appropriate for continued rehabilitative care in the following setting: see below Patient has agreed to participate in recommended program. Yes and Potentially Note that insurance prior authorization may be required for reimbursement for recommended care.  Comment: Pt has been unable to get out of bed yet due to bleeding from wound. Will follow as she mobilizes with PT.  Pt and family are interested in CIR if she has functional needs.   Rehab Admissions  Coordinator to follow up.  Thanks,  Meredith Staggers, MD, Mellody Drown    Cathlyn Parsons., PA-C 12/12/2016

## 2016-12-12 NOTE — Evaluation (Signed)
Physical Therapy Evaluation Patient Details Name: [name] MRN: [id number] DOB: 01/13/1928 Today's Date: 12/12/2016   History of Present Illness  Pt is an 83 y.o. female s/p RIGHT TRANSMETATARSAL AMPUTATION. PMHx: Anginal pain, Arthritis, Chronic lower back pain, DVT, Fibromyalgia, GERD, High cholesterol, HTN, Macular degeneration, PVD, TIA.  Clinical Impression  Pt is seen with PT and OT together due to excessive pain earlier and concern for her tolerance of activity.  Pt was given assistance to bedside with darco shoe to protect foot but with RLE in dependent position began to seep blood onto the floor, very dark red.  Nursing was contacted and assisted her back to bed with RLE elevated and heel floating, absorbent pad in place to wait for her doctor to come see her for bandage change.  Per family this had happened yesterday and now the doctor is wanting to see it to change bandage.  Will try to see again tomorrow if able to review home exercises and to attempt standing to inform family of how to help her.    Follow Up Recommendations Home health PT;Supervision/Assistance - 24 hour;Supervision for mobility/OOB    Equipment Recommendations  None recommended by PT    Recommendations for Other Services       Precautions / Restrictions Precautions Precautions: Fall Required Braces or Orthoses: Other Brace/Splint Other Brace/Splint: R darco shoe Restrictions Weight Bearing Restrictions: Yes Other Position/Activity Restrictions: WB on heel R foot only in Darco shoe      Mobility  Bed Mobility Overal bed mobility: Needs Assistance Bed Mobility: Supine to Sit;Sit to Supine     Supine to sit: Min guard Sit to supine: Min assist   General bed mobility comments: lifted legs back to bed, assisted her to scoot up in bed with 2 person assist  Transfers                 General transfer comment: Not assessed due to bleeding from incision  Ambulation/Gait              General Gait Details: unable to attempt due to pt starting to bleed from under bandage  Stairs            Wheelchair Mobility    Modified Rankin (Stroke Patients Only)       Balance Overall balance assessment: Needs assistance Sitting-balance support: Bilateral upper extremity supported;Feet supported Sitting balance-Leahy Scale: Fair                                       Pertinent Vitals/Pain Pain Assessment: 0-10 Pain Score: 3  Pain Location: R heel Pain Descriptors / Indicators: Sore Pain Intervention(s): Limited activity within patient's tolerance;Repositioned;Monitored during session    Home Living Family/patient expects to be discharged to:: Private residence Living Arrangements: Spouse/significant other Available Help at Discharge: Family;Available 24 hours/day Type of Home: House Home Access: Ramped entrance     Home Layout: One level Home Equipment: Walker - 2 wheels;Bedside commode;Cane - single point;Grab bars - tub/shower;Wheelchair - manual      Prior Function Level of Independence: Needs assistance   Gait / Transfers Assistance Needed: RW with some family help recently  ADL's / Homemaking Assistance Needed: daughter assists with shower transfers and ADL as needed        Hand Dominance   Dominant Hand: Right    Extremity/Trunk Assessment   Upper Extremity Assessment Upper Extremity Assessment: Generalized  weakness    Lower Extremity Assessment Lower Extremity Assessment: Generalized weakness    Cervical / Trunk Assessment Cervical / Trunk Assessment: Kyphotic  Communication   Communication: No difficulties  Cognition Arousal/Alertness: Awake/alert Behavior During Therapy: WFL for tasks assessed/performed Overall Cognitive Status: Within Functional Limits for tasks assessed                      General Comments      Exercises     Assessment/Plan    PT Assessment Patient needs continued PT services   PT Problem List Decreased strength;Decreased range of motion;Decreased activity tolerance;Decreased balance;Decreased mobility;Decreased knowledge of use of DME;Pain;Decreased skin integrity          PT Treatment Interventions DME instruction;Gait training;Functional mobility training;Therapeutic activities;Therapeutic exercise;Balance training;Neuromuscular re-education;Patient/family education    PT Goals (Current goals can be found in the Care Plan section)  Acute Rehab PT Goals Patient Stated Goal: return home PT Goal Formulation: With patient/family Time For Goal Achievement: 12/26/16 Potential to Achieve Goals: Good    Frequency Min 3X/week   Barriers to discharge Other (comment) (needs to be assessed for gait and transfers if possible)      Co-evaluation PT/OT/SLP Co-Evaluation/Treatment: Yes Reason for Co-Treatment: For patient/therapist safety;Other (comment) (activity tolerance and medical concerns/pain) PT goals addressed during session: Mobility/safety with mobility;Balance OT goals addressed during session: ADL's and self-care       End of Session Equipment Utilized During Treatment: Gait belt;Other (comment) (darco shoe) Activity Tolerance: Treatment limited secondary to medical complications (Comment) (bleeding on R foot from surgical site) Patient left: in bed;with call bell/phone within reach;with family/visitor present;with nursing/sitter in room Nurse Communication: Mobility status;Other (comment) (issue with incision)         Time: 1038-1100 PT Time Calculation (min) (ACUTE ONLY): 22 min   Charges:   PT Evaluation $PT Eval Moderate Complexity: 1 Procedure     PT G CodesRamond Dial 01/05/17, 12:44 PM   Mee Hives, PT MS Acute Rehab Dept. Number: [place] [facility] [place]

## 2016-12-12 NOTE — Op Note (Signed)
    Patient name: [name] MRN: [id number] DOB: Feb 23, 1928 Sex: female  12/11/2016 Pre-operative Diagnosis: Ischemic right foot Post-operative diagnosis:  Same Surgeon:  [name] Assistants:  Gerri Lins Procedure:   Right transmetatarsal amputation Anesthesia:  Gen. Blood Loss:  See anesthesia record Specimens:  Right forefoot  Findings:  Moderate capillary bleeding, healthy tissue  Indications:  The patient has undergone multiple procedures for limb salvage.  We have been following a right great toe wound.  The patient is got the point where she can no longer tolerate the pain and is dependent on narcotics continuously.  We have decided to proceed with a right transmetatarsal amputation.  She understands that she is at risk for more proximal amputation at this does not heal  Procedure:  The patient was identified in the holding area and taken to [place] [facility]  The patient was then placed supine on the table. general anesthesia was administered.  The patient was prepped and draped in the usual sterile fashion.  A time out was called and antibiotics were administered.  A fishmouth incision was made at the base of the digits.  The incision was carried down to the bone.  An oscillating saw was used to transect the bone after a periosteal elevator was used to elevate the periosteum.  A rasp was used to smooth out the bone surface.  There was mild to moderate bleeding.  The tissue appeared healthy.  The wound was then copiously irrigated.  The incision was closed with interrupted 3-0 nylon suture followed by sterile dressing.   Disposition:  To PACU in stable condition.   Theotis Burrow, M.D. [facility] Office: [phone] Pager:  [phone]

## 2016-12-12 NOTE — Progress Notes (Signed)
PT Cancellation Note  Patient Details Name: CYNDIA SALA MRN: [id number] DOB: 01/28/1928   Cancelled Treatment:    Reason Eval/Treat Not Completed: Medical issues which prohibited therapy;Pain limiting ability to participate;Other (comment) (Nursing hold request).  Follow up as time and pt allow.   Ramond Dial 12/12/2016, 8:47 AM   Mee Hives, PT MS Acute Rehab Dept. Number: [place] [facility] [place]

## 2016-12-12 NOTE — Progress Notes (Signed)
Orthopedic Tech Progress Note Patient Details:  [name] 1928/01/01 [id number]  Ortho Devices Type of Ortho Device: Darco shoe Ortho Device/Splint Location: rle Ortho Device/Splint Interventions: Application   [name] 12/12/2016, 8:56 AM Viewed order from RN order list

## 2016-12-12 NOTE — Care Management Note (Signed)
Case Management Note [name] RN, BSN Unit 2W-Case Manager [phone]  Patient Details  Name: [name] MRN: [id number] Date of Birth: [birth year]  Subjective/Objective:   Pt admitted s/p TMA                 Action/Plan: PTA pt lived at home- has supportive family- pt has pre-op referral to [facility]- order placed for HHPT- spoke with pt at bedside- discussed [facility] services- pt states she has had HH with [facility] in past and would like to continue services with them- notified Tiffany with [facility] of plan to return home with  Family and [facility] services. Pt would also like a rollator for home- will ask MD for DME order. Once order placed can have DME delivered to room prior to discharge.   Expected Discharge Date:                  Expected Discharge Plan:  [place]  In-House Referral:     Discharge planning Services  CM Consult  Post Acute Care Choice:  Durable Medical Equipment, Home Health Choice offered to:  Patient  DME Arranged:  Walker rolling with seat DME Agency:     HH Arranged:  PT HH Agency:  [facility]  Status of Service:  Completed, signed off  If discussed at [place] of Stay Meetings, dates discussed:    Additional Comments:  Dawayne Tannia, RN 12/12/2016, 2:51 PM

## 2016-12-12 NOTE — Progress Notes (Addendum)
[facility] [place]  Subjective  - She had a bad night after she got up to the bedside toilet and had sever increased pain with bleeding.  This morning her pain is better.   Objective (!) 149/49 64 97.7 F (36.5 C) (Oral) 17 95%  Intake/Output Summary (Last 24 hours) at 12/12/16 0753 Last data filed at 12/12/16 0300  Gross per 24 hour  Intake             1445 ml  Output               20 ml  Net             1425 ml    Right transmet incision no active bleeding with dressing change.  Sutures intact. Doppler Peroneal and AT signals Clean dry dressing applied  Assessment/Planning: POD # 1 Transmetatarsal amputation right foot Darco shoe ordered PT for weight bearing  HH PT   Theda Sers, EMMA [facility] 12/12/2016 7:53 AM --  Laboratory Lab Results:  Recent Labs  12/11/16 1103 12/12/16 0242  WBC 7.2 5.9  HGB 11.1* 10.1*  HCT 34.6* 31.8*  PLT 245 219   BMET  Recent Labs  12/11/16 0737 12/11/16 1103 12/12/16 0242  NA 135  --  136  K 4.0  --  3.9  CL 99*  --  102  CO2 25  --  23  GLUCOSE 114*  --  99  BUN 19  --  17  CREATININE 1.19* 1.19* 1.07*  CALCIUM 9.7  --  8.9    COAG Lab Results  Component Value Date   INR 1.00 10/23/2016   INR 1.01 09/24/2016   INR 1.01 09/02/2013   No results found for: PTT   ABL:  Dressing dry.  Check CBC tomorrow Add toradol for pain control.  Check Creatinine tomorrow  [name]

## 2016-12-12 NOTE — Progress Notes (Signed)
OT Cancellation Note  Patient Details Name: [name] MRN: [id number] DOB: May 14, 1928   Cancelled Treatment:    Reason Eval/Treat Not Completed: Pain limiting ability to participate. RN requesting hold on therapy this AM due to pts pain; getting ready to receive pain medication. Will follow up as time allows.  Binnie Kand M.S., OTR/L Pager: [phone]  12/12/2016, 8:34 AM

## 2016-12-12 NOTE — Evaluation (Signed)
Occupational Therapy Evaluation Patient Details Name: [name] MRN: [id number] DOB: [birth year] Today's Date: 12/12/2016    History of Present Illness Pt is an 83 y.o. female s/p RIGHT TRANSMETATARSAL AMPUTATION. PMHx: Anginal pain, Arthritis, Chronic lower back pain, DVT, Fibromyalgia, GERD, High cholesterol, HTN, Macular degeneration, PVD, TIA.   Clinical Impression   Pt reports daughter assisted with shower transfers but was fairly independent with BADL PTA. Pt able to perform bed mobility today with min guard-min assist. Pt sitting EOB with R foot in dependent position without WB; noted to have blood from incision pooling on floor. Pt returned to supine and RLE elevated; RN called to room. Pt planning to d/c home with 24/7 assist from family. Recommending HHOT for follow up to maximize independence and safety with ADL and functional mobility upon return home. Pt would benefit from continued skilled OT to address established goals.    Follow Up Recommendations  Home health OT;Supervision/Assistance - 24 hour    Equipment Recommendations  None recommended by OT    Recommendations for Other Services       Precautions / Restrictions Precautions Precautions: Fall Required Braces or Orthoses: Other Brace/Splint Other Brace/Splint: R darco shoe Restrictions Weight Bearing Restrictions: Yes Other Position/Activity Restrictions: Weight bearing through heel only with Darco shoe      Mobility Bed Mobility Overal bed mobility: Needs Assistance Bed Mobility: Supine to Sit;Sit to Supine     Supine to sit: Min guard Sit to supine: Min assist   General bed mobility comments: Assist for LEs back to bed. Increased time required but pt able to manage supine > sit without physical assist, just close guard.  Transfers                 General transfer comment: Not assessed due to bleeding from incision    Balance Overall balance assessment: Needs  assistance Sitting-balance support: Bilateral upper extremity supported;Feet supported Sitting balance-Leahy Scale: Fair                                      ADL Overall ADL's : Needs assistance/impaired Eating/Feeding: Set up;Sitting   Grooming: Set up;Supervision/safety;Sitting   Upper Body Bathing: Minimal assistance;Sitting   Lower Body Bathing: Maximal assistance;Bed level   Upper Body Dressing : Min guard;Sitting   Lower Body Dressing: Maximal assistance;Bed level Lower Body Dressing Details (indicate cue type and reason): to don/doff darco shoe               General ADL Comments: Pt sitting EOB with R foot in dependent position but no weight bearing; noted to have blood from incision pooling on floor. Returned pt to supine position and elevated RLE; RN called to room to assist.     Vision     Perception     Praxis      Pertinent Vitals/Pain Pain Assessment: 0-10 Pain Score: 3  Pain Location: R heel Pain Descriptors / Indicators: Dull;Sore Pain Intervention(s): Monitored during session;Repositioned;Premedicated before session     Hand Dominance Right   Extremity/Trunk Assessment Upper Extremity Assessment Upper Extremity Assessment: Generalized weakness   Lower Extremity Assessment Lower Extremity Assessment: Defer to PT evaluation   Cervical / Trunk Assessment Cervical / Trunk Assessment: Kyphotic   Communication Communication Communication: No difficulties   Cognition Arousal/Alertness: Awake/alert Behavior During Therapy: WFL for tasks assessed/performed Overall Cognitive Status: Within Functional Limits for tasks assessed  General Comments       Exercises       Shoulder Instructions      Home Living Family/patient expects to be discharged to:: Private residence Living Arrangements: Spouse/significant other Available Help at Discharge: Family;Available 24 hours/day Type of Home: House Home  Access: Ramped entrance     Home Layout: One level     Bathroom Shower/Tub: Occupational psychologist: Standard Bathroom Accessibility: Yes How Accessible: Accessible via wheelchair Home Equipment: Allen - 2 wheels;Bedside commode;Cane - single point;Grab bars - tub/shower;Wheelchair - manual (BSC x2)          Prior Functioning/Environment Level of Independence: Needs assistance  Gait / Transfers Assistance Needed: RW for mobililty ADL's / Homemaking Assistance Needed: daughter assists with shower transfers and ADL as needed            OT Problem List: Decreased strength;Impaired balance (sitting and/or standing);Decreased knowledge of use of DME or AE;Decreased knowledge of precautions;Pain   OT Treatment/Interventions: Self-care/ADL training;Therapeutic exercise;Energy conservation;DME and/or AE instruction;Therapeutic activities;Patient/family education;Balance training    OT Goals(Current goals can be found in the care plan section) Acute Rehab OT Goals Patient Stated Goal: return home OT Goal Formulation: With patient/family Time For Goal Achievement: 12/26/16 Potential to Achieve Goals: Good ADL Goals Pt Will Perform Lower Body Bathing: with supervision;sit to/from stand Pt Will Perform Lower Body Dressing: with supervision;sit to/from stand Pt Will Transfer to Toilet: with min guard assist;ambulating;bedside commode Pt Will Perform Toileting - Clothing Manipulation and hygiene: with supervision;sit to/from stand  OT Frequency: Min 2X/week   Barriers to D/C:            Co-evaluation PT/OT/SLP Co-Evaluation/Treatment: Yes Reason for Co-Treatment: Other (comment) (activity tolerance/pain)   OT goals addressed during session: ADL's and self-care      End of Session Equipment Utilized During Treatment: Other (comment) (darco shoe) Nurse Communication: Other (comment) (bleeding from incision with dependent position)  Activity Tolerance: Patient  tolerated treatment well;Treatment limited secondary to medical complications (Comment) Patient left: in bed;with call bell/phone within reach;with family/visitor present   Time: [id number] OT Time Calculation (min): 23 min Charges:  OT General Charges $OT Visit: 1 Procedure OT Evaluation $OT Eval Moderate Complexity: 1 Procedure G-Codes:     Binnie Kand M.S., OTR/L Pager: [phone]  12/12/2016, 12:27 PM

## 2016-12-12 NOTE — Progress Notes (Signed)
Rehab admissions - Noted PT/OT recommending HH therapies.  Currently rehab beds are full with no beds available over the next several days.  I will follow for progress.  [id number]

## 2016-12-13 LAB — CBC
HCT: 30.9 % — ABNORMAL LOW (ref 36.0–46.0)
HEMOGLOBIN: 9.8 g/dL — AB (ref 12.0–15.0)
MCH: 29.1 pg (ref 26.0–34.0)
MCHC: 31.7 g/dL (ref 30.0–36.0)
MCV: 91.7 fL (ref 78.0–100.0)
PLATELETS: 231 10*3/uL (ref 150–400)
RBC: 3.37 MIL/uL — ABNORMAL LOW (ref 3.87–5.11)
RDW: 14.9 % (ref 11.5–15.5)
WBC: 6.7 10*3/uL (ref 4.0–10.5)

## 2016-12-13 LAB — BASIC METABOLIC PANEL
Anion gap: 7 (ref 5–15)
BUN: 21 mg/dL — AB (ref 6–20)
CALCIUM: 9 mg/dL (ref 8.9–10.3)
CO2: 23 mmol/L (ref 22–32)
CREATININE: 1.24 mg/dL — AB (ref 0.44–1.00)
Chloride: 103 mmol/L (ref 101–111)
GFR calc Af Amer: 44 mL/min — ABNORMAL LOW (ref >60)
GFR, EST NON AFRICAN AMERICAN: 38 mL/min — AB (ref >60)
GLUCOSE: 105 mg/dL — AB (ref 65–99)
Potassium: 4.4 mmol/L (ref 3.5–5.1)
Sodium: 133 mmol/L — ABNORMAL LOW (ref 135–145)

## 2016-12-13 MED ORDER — SILVER NITRATE-POT NITRATE 75-25 % EX MISC
5.0000 | CUTANEOUS | Status: DC | PRN
  Administered 2016-12-13: 5 via TOPICAL
  Filled 2016-12-13 (×3): qty 5

## 2016-12-13 MED ORDER — OXYCODONE-ACETAMINOPHEN 5-325 MG PO TABS
1.0000 | ORAL_TABLET | ORAL | Status: DC | PRN
  Administered 2016-12-13 – 2016-12-14 (×5): 1 via ORAL
  Administered 2016-12-14 – 2016-12-15 (×5): 2 via ORAL
  Administered 2016-12-15: 1 via ORAL
  Administered 2016-12-15: 2 via ORAL
  Administered 2016-12-15 – 2016-12-16 (×4): 1 via ORAL
  Administered 2016-12-16: 2 via ORAL
  Administered 2016-12-16 – 2016-12-17 (×3): 1 via ORAL
  Administered 2016-12-17 – 2016-12-18 (×3): 2 via ORAL
  Administered 2016-12-18 – 2016-12-20 (×9): 1 via ORAL
  Filled 2016-12-13 (×2): qty 2
  Filled 2016-12-13 (×2): qty 1
  Filled 2016-12-13 (×8): qty 2
  Filled 2016-12-13 (×4): qty 1
  Filled 2016-12-13: qty 2
  Filled 2016-12-13: qty 1
  Filled 2016-12-13 (×3): qty 2
  Filled 2016-12-13 (×2): qty 1
  Filled 2016-12-13: qty 2
  Filled 2016-12-13: qty 1
  Filled 2016-12-13: qty 2
  Filled 2016-12-13 (×2): qty 1
  Filled 2016-12-13: qty 2
  Filled 2016-12-13 (×2): qty 1
  Filled 2016-12-13 (×2): qty 2
  Filled 2016-12-13: qty 1

## 2016-12-13 NOTE — Progress Notes (Addendum)
[facility] [place]  Subjective  - Pain is still and issue, no bleeding over night.   Objective (!) 156/49 76 98.7 F (37.1 C) (Oral) 18 95%  Intake/Output Summary (Last 24 hours) at 12/13/16 0734 Last data filed at 12/12/16 1300  Gross per 24 hour  Intake              480 ml  Output                0 ml  Net              480 ml    Doppler AT signal Dressing intact C/D No edema  Assessment/Planning: POD # 2 Right transmetatarsal amputation  Plan trial of sitting up to eat breakfast . Will change dressing tomorrow Stop Toradol, change hydrocodone to percocet for pain control. Possible discharge tomorrow  Laurence Slate [facility] 12/13/2016 7:34 AM --  Laboratory Lab Results:  Recent Labs  12/12/16 0242 12/13/16 0254  WBC 5.9 6.7  HGB 10.1* 9.8*  HCT 31.8* 30.9*  PLT 219 231   BMET  Recent Labs  12/12/16 0242 12/13/16 0254  NA 136 133*  K 3.9 4.4  CL 102 103  CO2 23 23  GLUCOSE 99 105*  BUN 17 21*  CREATININE 1.07* 1.24*  CALCIUM 8.9 9.0    COAG Lab Results  Component Value Date   INR 1.00 10/23/2016   INR 1.01 09/24/2016   INR 1.01 09/02/2013   No results found for: PTT  I have interviewed patient with PA and agree with assessment and plan above. Suture ligation of skin bleeders at bedside. Wound seems hemostatic, pain better controlled. Hopefully home soon.  Brandon C. Donzetta Matters, MD [facility] [place] Office: [phone] Pager: [phone]

## 2016-12-13 NOTE — Progress Notes (Signed)
Patient resting quietly in bed, Lovenox sq due at 1300, family did not want this RN to wake patient, family wants to wait till patient wakes up before giving her Lovenox, family educated on the need for Lovenox, she vebalised understanding, will continue to monitor

## 2016-12-13 NOTE — Progress Notes (Signed)
PT Cancellation Note  Patient Details Name: [name] MRN: [id number] DOB: Oct 05, 1928   Cancelled Treatment:    Reason Eval/Treat Not Completed: Medical issues which prohibited therapy.  Per family, patient sat EOB with nursing this am, and foot started bleeding again.  Returned to supine and added dressing by nursing.  Per family, patient with increased pain > pain meds and patient now asleep.  Family asked PT not to get patient up.  Report they were instructed not to get patient up again today.  Will return tomorrow for PT session.   [name] 12/13/2016, 1:51 PM [name]. [name], [place] Pager [phone]

## 2016-12-14 MED ORDER — CEPHALEXIN 250 MG PO CAPS
250.0000 mg | ORAL_CAPSULE | Freq: Three times a day (TID) | ORAL | Status: DC
  Filled 2016-12-14: qty 1

## 2016-12-14 MED ORDER — GABAPENTIN 300 MG PO CAPS
600.0000 mg | ORAL_CAPSULE | Freq: Three times a day (TID) | ORAL | Status: DC
  Administered 2016-12-14 – 2016-12-30 (×46): 600 mg via ORAL
  Filled 2016-12-14 (×50): qty 2

## 2016-12-14 MED ORDER — HYDROCHLOROTHIAZIDE 10 MG/ML ORAL SUSPENSION
6.2500 mg | Freq: Every day | ORAL | Status: DC
  Administered 2016-12-15 – 2016-12-30 (×14): 6.25 mg via ORAL
  Filled 2016-12-14 (×20): qty 1.25

## 2016-12-14 MED ORDER — CEPHALEXIN 500 MG PO CAPS
500.0000 mg | ORAL_CAPSULE | Freq: Three times a day (TID) | ORAL | Status: DC
  Administered 2016-12-14 – 2016-12-16 (×8): 500 mg via ORAL
  Filled 2016-12-14 (×8): qty 1

## 2016-12-14 NOTE — Progress Notes (Addendum)
[facility] [place]  Subjective  - Pain is still an issue requiring IV medication.   Objective (!) 181/70 81 98.1 F (36.7 C) (Oral) 18 98%  Intake/Output Summary (Last 24 hours) at 12/14/16 0725 Last data filed at 12/13/16 1230  Gross per 24 hour  Intake              240 ml  Output                0 ml  Net              240 ml    Right Transmet dressing is clean and dry this am No edema in the lower leg Heart RRR Lungs non labored breathing   Assessment/Planning: POD # 3 Right transmetatarsal amputation  She was on 600 mg Gabapentin BID, I changed it to TID Keflex 500 mg TID was added to her daily medications, this was a plan per [name] pre-op after IV antibiotics where given due to increased risk of infection in the past. 2 percocet  q 4 and IV push Will try another trial of sitting up to eat this am and check for bleeding I will return later to take down the dressing and inspect the incision Goals are pain control and bleeding control  Under clean conditions 2 additional stiches where placed at the incision by Dr. Donzetta Matters yesterday  [place], EMMA [facility] 12/14/2016 7:25 AM --  Laboratory Lab Results:  Recent Labs  12/12/16 0242 12/13/16 0254  WBC 5.9 6.7  HGB 10.1* 9.8*  HCT 31.8* 30.9*  PLT 219 231   BMET  Recent Labs  12/12/16 0242 12/13/16 0254  NA 136 133*  K 3.9 4.4  CL 102 103  CO2 23 23  GLUCOSE 99 105*  BUN 17 21*  CREATININE 1.07* 1.24*  CALCIUM 8.9 9.0    COAG Lab Results  Component Value Date   INR 1.00 10/23/2016   INR 1.01 09/24/2016   INR 1.01 09/02/2013   No results found for: PTT   I have independently interviewed patient and agree with PA assessment and plan above. She did have some bleeding again with foot in dependent position but also has exquisite pain at amp site. Pressure applied and seemed to tamponade for now. Will recheck cbc tomorrow a.m.. Neurontin increased to attempt better pain  control.   Cordarrius Coad C. Donzetta Matters, MD [facility] [place] Office: [phone] Pager: [phone]

## 2016-12-14 NOTE — Progress Notes (Signed)
PT Cancellation Note  Patient Details Name: [name] MRN: [id number] DOB: 1928/09/23   Cancelled Treatment:    Reason Eval/Treat Not Completed: Medical issues which prohibited therapy   Discussed pt with RN; Concern for continued bleeding, and he told me MD is requesting to hold OOB activity;   Will return tomorrow to assess for appropriateness of PT;   Roney [place], [place]  Acute Rehabilitation Services Pager [phone] Office [phone]    Colletta [place] 12/14/2016, 3:53 PM

## 2016-12-15 ENCOUNTER — Ambulatory Visit: Payer: Medicare Other | Admitting: Surgery

## 2016-12-15 LAB — CBC
HCT: 30.8 % — ABNORMAL LOW (ref 36.0–46.0)
HEMOGLOBIN: 10.1 g/dL — AB (ref 12.0–15.0)
MCH: 29.5 pg (ref 26.0–34.0)
MCHC: 32.8 g/dL (ref 30.0–36.0)
MCV: 90.1 fL (ref 78.0–100.0)
Platelets: 239 10*3/uL (ref 150–400)
RBC: 3.42 MIL/uL — ABNORMAL LOW (ref 3.87–5.11)
RDW: 14.9 % (ref 11.5–15.5)
WBC: 11.4 10*3/uL — AB (ref 4.0–10.5)

## 2016-12-15 MED ORDER — SODIUM CHLORIDE 0.9 % IV SOLN
INTRAVENOUS | Status: DC
  Administered 2016-12-15 – 2016-12-25 (×7): via INTRAVENOUS

## 2016-12-15 NOTE — Progress Notes (Addendum)
[facility] [place]  Subjective  - She is having increased pain and more frequent needing IV pain medication.   Objective (!) 158/54 80 98.7 F (37.1 C) (Oral) 18 95%  Intake/Output Summary (Last 24 hours) at 12/15/16 0715 Last data filed at 12/14/16 1630  Gross per 24 hour  Intake              280 ml  Output                0 ml  Net              280 ml    Right foot dressing clean and dry, did not remove the dressing this am. Right LE without edema, warm to touch  Assessment/Planning: POD # 4 Right transmetatarsal amputation  We have changed hydrocodone to percocet, added Gabapentin 600 mg TID from BID, added IV push without good pain control. The skin surrounding the incision appears healthy, but every time she puts it in a dependent position it bleeds.  We have used "snow" and  Compression, multiple redressing, and lastly Dr. Donzetta Matters placed two stiches in the stump at bedside to try and control the bleeding episodes.  Non of this seems to have worked.   She is on Keflex 500 mg TID as discussed prior to surgery.   Cr 1.24 has elevated and UO has decreased will restart IV NS at 100 cc/hr. She is becoming slowly confused per the daughter due to the fact she has had to stay in the bed so much.   I will discuss options and plan of care with Dr. Fae Pippin, [facility] 12/15/2016 7:15 AM --  Laboratory Lab Results:  Recent Labs  12/13/16 0254 12/15/16 0315  WBC 6.7 11.4*  HGB 9.8* 10.1*  HCT 30.9* 30.8*  PLT 231 239   BMET  Recent Labs  12/13/16 0254  NA 133*  K 4.4  CL 103  CO2 23  GLUCOSE 105*  BUN 21*  CREATININE 1.24*  CALCIUM 9.0    COAG Lab Results  Component Value Date   INR 1.00 10/23/2016   INR 1.01 09/24/2016   INR 1.01 09/02/2013   No results found for: PTT  I agree with the above.  I have seen and evaluated the patient.  She continues to have significant pain in her amputation site.  In addition she has had  several issues of bleeding when she stands up.  Sutures were placed over the weekend.  She did not tolerate a compressive dressing.  I removed the dressing today.  He wound appears adequate.  There is some areas of hematoma.  There is no active bleeding.  We will continue to monitor her for bleeding.  Worse case would be that we have to give back to the operating room for exploration.  I would like to avoid that if possible.  [name]

## 2016-12-15 NOTE — Progress Notes (Signed)
RN obtained verbal order from PA with vascular for OK to attempt to sit patient up on side of bed. Verbal order given to attempt to sit patient up on side of bed. Patient given ordered dilaudid, as requested, before moving. With daughter's assistance, Rn assisted patient in resting on side of bed. Patient voices no complaints while sitting up on side of bed, stated that wound is "not throbbing as bad as it had." Patient has had no bleeding from right foot wound while sitting up on side of bed. RN will continue to assess.

## 2016-12-15 NOTE — Progress Notes (Signed)
PT Cancellation Note  Patient Details Name: [name] MRN: [id number] DOB: Apr 05, 1928   Cancelled Treatment:    Reason Eval/Treat Not Completed:  (pain). Pt refused OOB mobility as pt now in 10/10 R foot pain. RN just gave pt pain medication at 1039. Per RN, pt sat EOB x 20 min without bleeding and was excited for PT. Pt and family now deferring OOB mobility due to R LE pain. Acute PT to return as able.   Jemari Hallum M Franklin Baumbach 12/15/2016, 11:01 AM  Kittie Plater, PT, DPT Pager #: [phone] Office #: [phone]

## 2016-12-15 NOTE — Care Management Important Message (Signed)
Important Message  Patient Details  Name: [name] MRN: [id number] Date of Birth: 03-Nov-1928   Medicare Important Message Given:  Yes    [name] 12/15/2016, 1:12 PM

## 2016-12-16 LAB — BASIC METABOLIC PANEL
ANION GAP: 11 (ref 5–15)
BUN: 11 mg/dL (ref 6–20)
CHLORIDE: 102 mmol/L (ref 101–111)
CO2: 21 mmol/L — AB (ref 22–32)
Calcium: 8.6 mg/dL — ABNORMAL LOW (ref 8.9–10.3)
Creatinine, Ser: 0.85 mg/dL (ref 0.44–1.00)
GFR calc non Af Amer: >60 mL/min (ref >60)
GLUCOSE: 109 mg/dL — AB (ref 65–99)
POTASSIUM: 3.5 mmol/L (ref 3.5–5.1)
Sodium: 134 mmol/L — ABNORMAL LOW (ref 135–145)

## 2016-12-16 LAB — URINALYSIS, ROUTINE W REFLEX MICROSCOPIC
BILIRUBIN URINE: NEGATIVE
Glucose, UA: NEGATIVE mg/dL
Ketones, ur: NEGATIVE mg/dL
Nitrite: NEGATIVE
PROTEIN: NEGATIVE mg/dL
Specific Gravity, Urine: 1.011 (ref 1.005–1.030)
Squamous Epithelial / LPF: NONE SEEN
pH: 6 (ref 5.0–8.0)

## 2016-12-16 LAB — CBC
HEMATOCRIT: 28.1 % — AB (ref 36.0–46.0)
HEMOGLOBIN: 9.3 g/dL — AB (ref 12.0–15.0)
MCH: 29.9 pg (ref 26.0–34.0)
MCHC: 33.1 g/dL (ref 30.0–36.0)
MCV: 90.4 fL (ref 78.0–100.0)
Platelets: 218 10*3/uL (ref 150–400)
RBC: 3.11 MIL/uL — AB (ref 3.87–5.11)
RDW: 15 % (ref 11.5–15.5)
WBC: 12.8 10*3/uL — ABNORMAL HIGH (ref 4.0–10.5)

## 2016-12-16 MED ORDER — DEXTROSE 5 % IV SOLN
750.0000 mg | Freq: Three times a day (TID) | INTRAVENOUS | Status: DC
  Administered 2016-12-16 – 2016-12-19 (×8): 750 mg via INTRAVENOUS
  Filled 2016-12-16 (×9): qty 750

## 2016-12-16 MED ORDER — BISACODYL 5 MG PO TBEC
5.0000 mg | DELAYED_RELEASE_TABLET | Freq: Every day | ORAL | Status: DC | PRN
  Administered 2016-12-16: 5 mg via ORAL
  Filled 2016-12-16: qty 1

## 2016-12-16 NOTE — Progress Notes (Signed)
Rehab admissions - I met with patient and her family.  Patient continues to have pain in right foot amputation site.  Noted previous bleeding right amputation site as well.  Will see how patient does with therapies today.  Call me for questions.  #[phone]

## 2016-12-16 NOTE — Progress Notes (Signed)
While getting up with physical therapy to bedside commode, patient bled through her dressing. RN assessed the top part of dressing was saturated. RN notified Vascular Surgery PA and they stated to reinforce dressing until they finished in the OR and they would come see patient.  

## 2016-12-16 NOTE — Addendum Note (Signed)
Addendum  created 12/16/16 1353 by Myrtie Soman, MD   Anesthesia Intra Blocks edited, Sign clinical note

## 2016-12-16 NOTE — Progress Notes (Signed)
Called into pt's room by daughter, daughter reported pt has had low urinary output throughout the day, only going 10-20 mLs at a time. Bladder scanned pt and found 1071 mLs being retained with pt abdomen very distended. Dr Oneida Alar notified, verbals orders to place Foley. Foley placed and 1100 mLs of clear, yellow urine returned. Pt tolerated procedure well and verbalizes relief. Will continue to monitor pt.   Jaymes Graff, RN

## 2016-12-16 NOTE — Progress Notes (Addendum)
[facility] [place]  Subjective  - Confused last night, last IV dilaudid given at 9:50 pm.  Bladder scan showed 1000 cc.  Foley placed.   Objective (!) 163/61 88 98.8 F (37.1 C) (Oral) 18 95%  Intake/Output Summary (Last 24 hours) at 12/16/16 0801 Last data filed at 12/16/16 0300  Gross per 24 hour  Intake          2268.33 ml  Output             1125 ml  Net          1143.33 ml    Right dressing saturated re enforced with abd.  Changed at bedside, no active bleeding Incisional subcutaneous hematoma medial, skin warm.  Active range of motion ankle intact. LE no edema Abdomin soft, no BM since surgery, positive BS  Assessment/Planning: POD # 5 Right transmetatarsal amputation Labs pending for this am Foley to gravity Plan Dulcolax PRN for BM IV fluids 100 cc hr NS Encourage sitting at bedside PRN.   Laurence Slate [facility] 12/16/2016 8:01 AM --  Laboratory Lab Results:  Recent Labs  12/15/16 0315  WBC 11.4*  HGB 10.1*  HCT 30.8*  PLT 239   BMET No results for input(s): NA, K, CL, CO2, GLUCOSE, BUN, CREATININE, CALCIUM in the last 72 hours.  COAG Lab Results  Component Value Date   INR 1.00 10/23/2016   INR 1.01 09/24/2016   INR 1.01 09/02/2013   No results found for: PTT  Daughter concerned because patient has not been interactive today and somnolent.   Upon my arousing her, she was completely appropriate and answered al questions appropriately.  Daughter witnessed this.  She had another bleeding episode today.  Will plan for I&D in the OR tomorrow under ankle block With her confusion and full bladder today requiring foley catheter placement, I will check a UA tonight.  Wells Mollyann Halbert  Addendum:  UA was positive, so will start abx

## 2016-12-16 NOTE — Progress Notes (Signed)
Physical Therapy Treatment Patient Details Name: [name] MRN: [id number] DOB: Mar 29, 1928 Today's Date: 12/16/2016    History of Present Illness Pt is an 83 y.o. female s/p RIGHT TRANSMETATARSAL AMPUTATION. PMHx: Anginal pain, Arthritis, Chronic lower back pain, DVT, Fibromyalgia, GERD, High cholesterol, HTN, Macular degeneration, PVD, TIA.    PT Comments    Pt mobility greatly limited by pain in R LE at 10/10 and bleeding from incision when foot in dependent position. Pt unable to WB on R LE with darco shoe. Pt currently at [facility] for mobility. Recommend CIR to allow pt to achieve safe level of assist for dtr to take pt home.  Follow Up Recommendations  CIR     Equipment Recommendations   (TBD, may need w/c)    Recommendations for Other Services Rehab consult     Precautions / Restrictions Precautions Precautions: Fall Required Braces or Orthoses: Other Brace/Splint Other Brace/Splint: R darco shoe Restrictions Weight Bearing Restrictions: Yes RLE Weight Bearing: Non weight bearing Other Position/Activity Restrictions: WB on heel R foot only in Darco shoe    Mobility  Bed Mobility Overal bed mobility: Needs Assistance Bed Mobility: Supine to Sit     Supine to sit: Mod assist     General bed mobility comments: pt able to bring LEs off EOB, maxA for trunk elevation  Transfers Overall transfer level: Needs assistance Equipment used:  (1 person lift with gait belt) Transfers: Sit to/from [facility] Sit to Stand: Max assist;+2 safety/equipment Stand pivot transfers: Max assist;+2 safety/equipment       General transfer comment: 2 attempts, pt with R LE excrutiating pain limiting ability to WB on R LE, pt pivoted on ball of L foot  Ambulation/Gait             General Gait Details: unable   Stairs            Wheelchair Mobility    Modified Rankin (Stroke Patients Only)       Balance Overall balance assessment: Needs  assistance Sitting-balance support: No upper extremity supported;Feet supported Sitting balance-Leahy Scale: Fair     Standing balance support: Bilateral upper extremity supported Standing balance-Leahy Scale: Zero Standing balance comment: requires physical assist, unable to tolerate R LE WBing                    Cognition Arousal/Alertness: Awake/alert Behavior During Therapy: WFL for tasks assessed/performed Overall Cognitive Status: Within Functional Limits for tasks assessed                      Exercises General Exercises - Lower Extremity Ankle Circles/Pumps: AROM;Right;20 reps;Supine    General Comments General comments (skin integrity, edema, etc.): pt began bleeding from incision site s/p tranfer to [facility], RN aware      Pertinent Vitals/Pain Pain Assessment: 0-10 Pain Score: 10-Worst pain ever Pain Location: R foot Pain Descriptors / Indicators: Shooting Pain Intervention(s): Limited activity within patient's tolerance    Home Living                      Prior Function            PT Goals (current goals can now be found in the care plan section) Acute Rehab PT Goals Patient Stated Goal: stop the pain Progress towards PT goals: Progressing toward goals    Frequency    Min 3X/week      PT Plan Discharge plan needs to be updated  Co-evaluation             End of Session Equipment Utilized During Treatment: Gait belt Activity Tolerance: Patient limited by pain Patient left:  (on [facility] for privacy during BM, dtr and RN present)     Time: [id number] PT Time Calculation (min) (ACUTE ONLY): 36 min  Charges:  $Therapeutic Exercise: 8-22 mins $Therapeutic Activity: 8-22 mins                    G Codes:      Hamish Banks M Kariann Wecker 2017-01-05, 1:51 PM   Kittie Plater, PT, DPT Pager #: [phone] Office #: [phone]

## 2016-12-16 NOTE — Progress Notes (Signed)
1150 Two Rns and nurse tech assisted patient back to bedside from bedside commode where she had gotten up with physical therapy. While transferring patient back to bed, RN noticed there there was a moderate amount of blood oozing from dressing inside patient's boot. RN reinforced patient's dressing with ABD pads and kerlix gauze. Patient states she is in 10/10 pain, RN administered ordered hydromorphone.  1250 Reinforced dressing is still clean, dry, and intact. Patient states that she is "comfortable" when asked about her pain.

## 2016-12-17 ENCOUNTER — Encounter (HOSPITAL_COMMUNITY)
Admission: RE | Disposition: A | Discharge status: Rehabilitation Facility | Payer: Self-pay | Source: Ambulatory Visit | Attending: Surgery

## 2016-12-17 ENCOUNTER — Inpatient Hospital Stay (HOSPITAL_COMMUNITY): Payer: Medicare Other | Admitting: Certified Registered Nurse Anesthetist

## 2016-12-17 DIAGNOSIS — I998 Other disorder of circulatory system: Secondary | ICD-10-CM

## 2016-12-17 HISTORY — PX: I & D EXTREMITY: SHX5045

## 2016-12-17 LAB — BASIC METABOLIC PANEL
Anion gap: 4 — ABNORMAL LOW (ref 5–15)
BUN: 11 mg/dL (ref 6–20)
CHLORIDE: 105 mmol/L (ref 101–111)
CO2: 23 mmol/L (ref 22–32)
CREATININE: 0.94 mg/dL (ref 0.44–1.00)
Calcium: 8.5 mg/dL — ABNORMAL LOW (ref 8.9–10.3)
GFR calc Af Amer: >60 mL/min (ref >60)
GFR calc non Af Amer: 53 mL/min — ABNORMAL LOW (ref >60)
GLUCOSE: 108 mg/dL — AB (ref 65–99)
POTASSIUM: 3.6 mmol/L (ref 3.5–5.1)
SODIUM: 132 mmol/L — AB (ref 135–145)

## 2016-12-17 LAB — GLUCOSE, CAPILLARY: Glucose-Capillary: 77 mg/dL (ref 65–99)

## 2016-12-17 LAB — CBC
HEMATOCRIT: 25 % — AB (ref 36.0–46.0)
Hemoglobin: 8.1 g/dL — ABNORMAL LOW (ref 12.0–15.0)
MCH: 29.7 pg (ref 26.0–34.0)
MCHC: 32.4 g/dL (ref 30.0–36.0)
MCV: 91.6 fL (ref 78.0–100.0)
PLATELETS: 222 10*3/uL (ref 150–400)
RBC: 2.73 MIL/uL — ABNORMAL LOW (ref 3.87–5.11)
RDW: 15.2 % (ref 11.5–15.5)
WBC: 12.2 10*3/uL — AB (ref 4.0–10.5)

## 2016-12-17 SURGERY — IRRIGATION AND DEBRIDEMENT EXTREMITY
Anesthesia: Monitor Anesthesia Care | Site: Foot | Laterality: Right

## 2016-12-17 MED ORDER — LACTATED RINGERS IV SOLN
INTRAVENOUS | Status: DC
  Administered 2016-12-17: 11:00:00 via INTRAVENOUS

## 2016-12-17 MED ORDER — ONDANSETRON HCL 4 MG/2ML IJ SOLN
INTRAMUSCULAR | Status: DC | PRN
  Administered 2016-12-17: 4 mg via INTRAVENOUS

## 2016-12-17 MED ORDER — PHENYLEPHRINE HCL 10 MG/ML IJ SOLN
INTRAMUSCULAR | Status: DC | PRN
  Administered 2016-12-17: 40 ug via INTRAVENOUS

## 2016-12-17 MED ORDER — FENTANYL CITRATE (PF) 100 MCG/2ML IJ SOLN
12.5000 ug | Freq: Once | INTRAMUSCULAR | Status: AC
  Administered 2016-12-17: 12.5 ug via INTRAVENOUS

## 2016-12-17 MED ORDER — 0.9 % SODIUM CHLORIDE (POUR BTL) OPTIME
TOPICAL | Status: DC | PRN
  Administered 2016-12-17: 1000 mL

## 2016-12-17 MED ORDER — HEMOSTATIC AGENTS (NO CHARGE) OPTIME
TOPICAL | Status: DC | PRN
  Administered 2016-12-17: 1 via TOPICAL

## 2016-12-17 MED ORDER — LACTATED RINGERS IV SOLN
INTRAVENOUS | Status: DC | PRN
  Administered 2016-12-17: 11:00:00 via INTRAVENOUS

## 2016-12-17 MED ORDER — FENTANYL CITRATE (PF) 100 MCG/2ML IJ SOLN
12.5000 ug | Freq: Once | INTRAMUSCULAR | Status: AC
Start: 2016-12-17 — End: 2016-12-17
  Administered 2016-12-17: 12.5 ug via INTRAVENOUS

## 2016-12-17 MED ORDER — LIDOCAINE HCL (CARDIAC) 20 MG/ML IV SOLN
INTRAVENOUS | Status: DC | PRN
  Administered 2016-12-17: 30 mg via INTRATRACHEAL

## 2016-12-17 MED ORDER — PROPOFOL 500 MG/50ML IV EMUL
INTRAVENOUS | Status: DC | PRN
  Administered 2016-12-17: 25 ug/kg/min via INTRAVENOUS

## 2016-12-17 MED ORDER — FENTANYL CITRATE (PF) 100 MCG/2ML IJ SOLN
INTRAMUSCULAR | Status: AC
  Filled 2016-12-17: qty 2

## 2016-12-17 MED ORDER — MIDAZOLAM HCL 2 MG/2ML IJ SOLN
INTRAMUSCULAR | Status: AC
  Filled 2016-12-17: qty 2

## 2016-12-17 SURGICAL SUPPLY — 40 items
BANDAGE ELASTIC 4 VELCRO ST LF (GAUZE/BANDAGES/DRESSINGS) ×1 IMPLANT
BNDG GAUZE ELAST 4 BULKY (GAUZE/BANDAGES/DRESSINGS) ×1 IMPLANT
CANISTER SUCTION 2500CC (MISCELLANEOUS) ×2 IMPLANT
CLIP TI MEDIUM 6 (CLIP) ×2 IMPLANT
CLIP TI WIDE RED SMALL 6 (CLIP) ×2 IMPLANT
COVER SURGICAL LIGHT HANDLE (MISCELLANEOUS) ×2 IMPLANT
DRAPE EXTREMITY ABCS (DRAPES) ×1 IMPLANT
DRSG ADAPTIC 3X8 NADH LF (GAUZE/BANDAGES/DRESSINGS) ×1 IMPLANT
ELECT REM PT RETURN 9FT ADLT (ELECTROSURGICAL) ×2
ELECTRODE REM PT RTRN 9FT ADLT (ELECTROSURGICAL) ×1 IMPLANT
GAUZE SPONGE 4X4 12PLY STRL (GAUZE/BANDAGES/DRESSINGS) ×2 IMPLANT
GAUZE XEROFORM 5X9 LF (GAUZE/BANDAGES/DRESSINGS) IMPLANT
GLOVE BIOGEL PI IND STRL 6.5 (GLOVE) IMPLANT
GLOVE BIOGEL PI IND STRL 7.0 (GLOVE) IMPLANT
GLOVE BIOGEL PI IND STRL 7.5 (GLOVE) ×1 IMPLANT
GLOVE BIOGEL PI INDICATOR 6.5 (GLOVE) ×2
GLOVE BIOGEL PI INDICATOR 7.0 (GLOVE) ×3
GLOVE BIOGEL PI INDICATOR 7.5 (GLOVE) ×2
GLOVE ECLIPSE 7.0 STRL STRAW (GLOVE) ×1 IMPLANT
GLOVE SURG SS PI 6.5 STRL IVOR (GLOVE) ×1 IMPLANT
GLOVE SURG SS PI 7.5 STRL IVOR (GLOVE) ×2 IMPLANT
GOWN STRL REUS W/ TWL LRG LVL3 (GOWN DISPOSABLE) ×2 IMPLANT
GOWN STRL REUS W/ TWL XL LVL3 (GOWN DISPOSABLE) ×1 IMPLANT
GOWN STRL REUS W/TWL LRG LVL3 (GOWN DISPOSABLE) ×6
GOWN STRL REUS W/TWL XL LVL3 (GOWN DISPOSABLE) ×2
HEMOSTAT SNOW SURGICEL 2X4 (HEMOSTASIS) ×1 IMPLANT
KIT BASIN OR (CUSTOM PROCEDURE TRAY) ×2 IMPLANT
KIT ROOM TURNOVER OR (KITS) ×2 IMPLANT
NS IRRIG 1000ML POUR BTL (IV SOLUTION) ×2 IMPLANT
PACK GENERAL/GYN (CUSTOM PROCEDURE TRAY) IMPLANT
PAD ARMBOARD 7.5X6 YLW CONV (MISCELLANEOUS) ×4 IMPLANT
SPONGE GAUZE 4X4 12PLY STER LF (GAUZE/BANDAGES/DRESSINGS) ×1 IMPLANT
SUT ETHILON 2 0 PSLX (SUTURE) ×3 IMPLANT
SUT ETHILON 3 0 PS 1 (SUTURE) ×1 IMPLANT
SUT VIC AB 2-0 CTX 36 (SUTURE) IMPLANT
SUT VIC AB 3-0 SH 27 (SUTURE)
SUT VIC AB 3-0 SH 27X BRD (SUTURE) IMPLANT
SUT VIC AB 3-0 SH 8-18 (SUTURE) ×1 IMPLANT
SUT VICRYL 4-0 PS2 18IN ABS (SUTURE) IMPLANT
WATER STERILE IRR 1000ML POUR (IV SOLUTION) ×2 IMPLANT

## 2016-12-17 NOTE — Anesthesia Preprocedure Evaluation (Signed)
Anesthesia Evaluation  Patient identified by MRN, date of birth, ID band Patient awake    Reviewed: Allergy & Precautions, NPO status , Patient's Chart, lab work & pertinent test results  Airway Mallampati: II  TM Distance: >3 FB     Dental  (+) Edentulous Upper, Partial Lower   Pulmonary former smoker,    breath sounds clear to auscultation       Cardiovascular hypertension,  Rhythm:Regular Rate:Normal     Neuro/Psych    GI/Hepatic   Endo/Other    Renal/GU      Musculoskeletal   Abdominal   Peds  Hematology   Anesthesia Other Findings   Reproductive/Obstetrics                             Anesthesia Physical Anesthesia Plan  ASA: III  Anesthesia Plan: MAC and Regional   Post-op Pain Management:    Induction: Intravenous  Airway Management Planned: Simple Face Mask and Natural Airway  Additional Equipment:   Intra-op Plan:   Post-operative Plan:   Informed Consent: I have reviewed the patients History and Physical, chart, labs and discussed the procedure including the risks, benefits and alternatives for the proposed anesthesia with the patient or authorized representative who has indicated his/her understanding and acceptance.     Plan Discussed with: CRNA and Anesthesiologist  Anesthesia Plan Comments:         Anesthesia Quick Evaluation

## 2016-12-17 NOTE — Anesthesia Procedure Notes (Signed)
Anesthesia Regional Block:  Adductor canal block  Pre-Anesthetic Checklist: ,, timeout performed, Correct Patient, Correct Site, Correct Laterality, Correct Procedure, Correct Position, site marked, Risks and benefits discussed,  Surgical consent,  Pre-op evaluation,  At surgeon's request and post-op pain management  Laterality: Right  Prep: chloraprep       Needles:  Injection technique: Single-shot  Needle Type: Echogenic Stimulator Needle          Additional Needles:  Procedures: ultrasound guided (picture in chart) Adductor canal block Narrative:  Start time: 12/17/2016 10:30 AM End time: 12/17/2016 10:35 AM Injection made incrementally with aspirations every 5 mL.  Performed by: Personally  Anesthesiologist: Lonzell Dorris  Additional Notes: 20 cc 0.75% Naropin injected easily

## 2016-12-17 NOTE — Progress Notes (Signed)
OT Cancellation Note  Patient Details Name: [name] MRN: [id number] DOB: 08-17-1928   Cancelled Treatment:    Reason Eval/Treat Not Completed: Medical issues which prohibited therapy. Per RN, pt with continued bleeding from R foot incision site and uncontrolled pain. Plan for return to OR today for I&D. Will hold OT at this time and follow up post op.   Binnie Kand M.S., OTR/L Pager: [phone]  12/17/2016, 8:31 AM

## 2016-12-17 NOTE — Anesthesia Procedure Notes (Addendum)
Anesthesia Regional Block:  Popliteal block  Pre-Anesthetic Checklist: ,, timeout performed, Correct Patient, Correct Site, Correct Laterality, Correct Procedure, Correct Position, site marked, Risks and benefits discussed,  Surgical consent,  Pre-op evaluation,  At surgeon's request and post-op pain management  Laterality: Right  Prep: chloraprep       Needles:  Injection technique: Single-shot  Needle Type: Echogenic Stimulator Needle      Needle Gauge: 21 and 21 G    Additional Needles:  Procedures: ultrasound guided (picture in chart) Popliteal block Narrative:  Start time: 12/17/2016 10:20 AM End time: 12/17/2016 10:25 AM Injection made incrementally with aspirations every 5 mL.  Performed by: Personally  Anesthesiologist: Sumaiyah Markert  Additional Notes: 20 cc 0.75% Naropin injected easily

## 2016-12-17 NOTE — Progress Notes (Signed)
PT Cancellation Note  Patient Details Name: [name] MRN: [id number] DOB: 09/28/1928   Cancelled Treatment:    Reason Eval/Treat Not Completed: Patient at procedure or test/unavailable. Pt going back to OR for I and D due to continuous bleeding. Acute PT to return as able.   Lashai Grosch M Anneliese Leblond 12/17/2016, 9:18 AM   Kittie Plater, PT, DPT Pager #: [phone] Office #: [phone]

## 2016-12-17 NOTE — Progress Notes (Signed)
Pt. Arrived to short Stay with glasses and top denture. Daughter, [name], had both of these items.  Dr. Linna Caprice made aware that betablocker was not given to pt. earlier today, stated not to give it.

## 2016-12-17 NOTE — Anesthesia Postprocedure Evaluation (Signed)
Anesthesia Post Note  Patient: [name]  Procedure(s) Performed: Procedure(s) (LRB): IRRIGATION AND DEBRIDEMENT RIGHT FOOT (Right)  Patient location during evaluation: PACU Anesthesia Type: MAC Level of consciousness: awake, awake and alert and oriented Pain management: pain level controlled Vital Signs Assessment: post-procedure vital signs reviewed and stable Respiratory status: spontaneous breathing, nonlabored ventilation and respiratory function stable Cardiovascular status: blood pressure returned to baseline Postop Assessment: no headache Anesthetic complications: no       Last Vitals:  Vitals:   12/17/16 1230 12/17/16 1320  BP:  (!) 134/56  Pulse: 85 85  Resp: 20 19  Temp: 36.6 C 37.1 C    Last Pain:  Vitals:   12/17/16 1320  TempSrc: Oral  PainSc:                  Tyquez Hollibaugh COKER

## 2016-12-17 NOTE — Anesthesia Procedure Notes (Signed)
Procedure Name: MAC Date/Time: 12/17/2016 10:38 AM Performed by: Tressia Miners LEFFEW Pre-anesthesia Checklist: Patient identified, Emergency Drugs available, Suction available, Timeout performed and Patient being monitored Patient Re-evaluated:Patient Re-evaluated prior to inductionOxygen Delivery Method: Simple face mask Placement Confirmation: positive ETCO2

## 2016-12-17 NOTE — Progress Notes (Signed)
Versed 2mg  and fentanyl 157mcg taken out of pyxis for block. 2mg  Versed not given and wasted in sink. Verified By Harden Mo, R.N. Fentanyl 25 mcg given to patient IV. The order put in was 12. 5 mcg instead of 25 mcg. So another order for 12.5 mcg put in. When charting waste of 75 mcg the pyxis would not accept it.  Waste verified by Harden Mo, R.N. In the sink.

## 2016-12-17 NOTE — Op Note (Signed)
    Patient name: [name] MRN: [id number] DOB: [birth year] Sex: female  12/11/2016 - 12/17/2016 Pre-operative Diagnosis: Bleeding from right transmetatarsal amputation Post-operative diagnosis:  Same Surgeon:  [name] Assistants:  Gerri Lins Procedure:    wound exploration, right transmetatarsal amputation Anesthesia:   popliteal block Blood Loss:  See anesthesia record Specimens:   none  Findings:   no obvious source of bleeding.  The incision was completely opened up and the wound fully explored.  Any possible source of bleeding was cauterized.  Indications:   the patient has a history of rest pain and ischemic toe on the right.  She underwent a transmetatarsal amputation approximately one week ago.  She has had problems with bloody drainage from her incision every time she stands up.  Therefore, we decided to reexplore the wound in the operating room.  Procedure:  The patient was identified in the holding area and taken to [place] [facility]  The patient was then placed supine on the table. regional anesthesia was administered.  The patient was prepped and draped in the usual sterile fashion.  A time out was called and antibiotics were administered.   I was initially on going to remove a few of the mattress sutures, however in order to fully examine the wound, all of the mattress sutures were removed.  There was no obvious source of bleeding.  Any area that potentially could be a bleeding source was cauterized.  The tissue looked healthy without evidence of infection.  The wound was then irrigated with warm saline.  I then elected to reapproximate the fascia with interrupted 3-0 Vicryl and then closed the skin with vertical mattress sutures of 3-0 nylon.  Sterile dressings were applied.  There were no complications.   Disposition:  To PACU in stable condition.   Theotis Burrow, M.D. [facility] Office: [phone] Pager:  [phone]

## 2016-12-17 NOTE — Transfer of Care (Signed)
Immediate Anesthesia Transfer of Care Note  Patient: [name]  Procedure(s) Performed: Procedure(s): IRRIGATION AND DEBRIDEMENT RIGHT FOOT (Right)  Patient Location: PACU  Anesthesia Type:MAC and Regional  Level of Consciousness: awake, alert , patient cooperative and responds to stimulation  Airway & Oxygen Therapy: Patient Spontanous Breathing and Patient connected to face mask oxygen  Post-op Assessment: Report given to RN and Post -op Vital signs reviewed and stable  Post vital signs: Reviewed and stable  Last Vitals:  Vitals:   12/17/16 1023 12/17/16 1025  BP: (!) 183/52   Pulse: (!) 104 98  Resp: 18 (!) 22  Temp:      Last Pain:  Vitals:   12/17/16 0726  TempSrc:   PainSc: 5       Patients Stated Pain Goal: 3 (09/16/58 4585)  Complications: No apparent anesthesia complications

## 2016-12-18 ENCOUNTER — Encounter (HOSPITAL_COMMUNITY): Payer: Self-pay | Admitting: Surgery

## 2016-12-18 LAB — CBC
HCT: 24.9 % — ABNORMAL LOW (ref 36.0–46.0)
Hemoglobin: 8.2 g/dL — ABNORMAL LOW (ref 12.0–15.0)
MCH: 30.4 pg (ref 26.0–34.0)
MCHC: 32.9 g/dL (ref 30.0–36.0)
MCV: 92.2 fL (ref 78.0–100.0)
PLATELETS: 245 10*3/uL (ref 150–400)
RBC: 2.7 MIL/uL — ABNORMAL LOW (ref 3.87–5.11)
RDW: 15.2 % (ref 11.5–15.5)
WBC: 10.1 10*3/uL (ref 4.0–10.5)

## 2016-12-18 LAB — BASIC METABOLIC PANEL
ANION GAP: 7 (ref 5–15)
BUN: 11 mg/dL (ref 6–20)
CALCIUM: 8.6 mg/dL — AB (ref 8.9–10.3)
CO2: 23 mmol/L (ref 22–32)
Chloride: 104 mmol/L (ref 101–111)
Creatinine, Ser: 1 mg/dL (ref 0.44–1.00)
GFR calc Af Amer: 57 mL/min — ABNORMAL LOW (ref >60)
GFR, EST NON AFRICAN AMERICAN: 50 mL/min — AB (ref >60)
Glucose, Bld: 105 mg/dL — ABNORMAL HIGH (ref 65–99)
Potassium: 3.5 mmol/L (ref 3.5–5.1)
Sodium: 134 mmol/L — ABNORMAL LOW (ref 135–145)

## 2016-12-18 MED ORDER — ENSURE ENLIVE PO LIQD
237.0000 mL | Freq: Two times a day (BID) | ORAL | Status: DC
  Administered 2016-12-19 – 2016-12-30 (×10): 237 mL via ORAL

## 2016-12-18 NOTE — Progress Notes (Signed)
OT Cancellation Note  Patient Details Name: [name] MRN: [id number] DOB: 1928/08/03   Cancelled Treatment:    Reason Eval/Treat Not Completed: Pain limiting ability to participate. Pt premedicated and continues to report 10/10 pain with writhing and moaning while laying in bed. Will follow up as time allows.  Binnie Kand M.S., OTR/L Pager: [phone]  12/18/2016, 4:39 PM

## 2016-12-18 NOTE — Care Management Important Message (Signed)
Important Message  Patient Details  Name: [name] MRN: [id number] Date of Birth: 02-05-1928   Medicare Important Message Given:  Yes    Nealie Mchatton Abena 12/18/2016, 11:55 AM

## 2016-12-18 NOTE — Progress Notes (Addendum)
[facility]  Subjective  - Comfortable since IV pain medication given this am and PO pain medication given at 3:30 this am.   Objective (!) 137/53 92 98.5 F (36.9 C) (Oral) 20 95%  Intake/Output Summary (Last 24 hours) at 12/18/16 0733 Last data filed at 12/17/16 2200  Gross per 24 hour  Intake              570 ml  Output              375 ml  Net              195 ml    Right foot dressing clean and dry, removed ace wrap due to discomfort Right leg warm, sensation grossly intact Abdomin soft  Assessment/Planning: POD # 1I& D exploration of right amputation site, 6 Right transmetatarsal amputation  Foley to gravity D/C later day if mobility without bleeding after breakfast Cont antibiotics for UTI Zinacef Sit up for breakfast PT eval and treat Cont. Current pain control with percocet and PRN IV dilaudid       Laurence Slate [facility] 12/18/2016 7:33 AM --  Laboratory Lab Results:  Recent Labs  12/17/16 0432 12/18/16 0634  WBC 12.2* 10.1  HGB 8.1* 8.2*  HCT 25.0* 24.9*  PLT 222 245   BMET  Recent Labs  12/17/16 0432 12/18/16 0634  NA 132* 134*  K 3.6 3.5  CL 105 104  CO2 23 23  GLUCOSE 108* 105*  BUN 11 11  CREATININE 0.94 1.00  CALCIUM 8.5* 8.6*    COAG Lab Results  Component Value Date   INR 1.00 10/23/2016   INR 1.01 09/24/2016   INR 1.01 09/02/2013   No results found for: PTT   Agree with the above Hopefully OOB today with PT  [name]

## 2016-12-19 ENCOUNTER — Inpatient Hospital Stay (HOSPITAL_COMMUNITY): Payer: Medicare Other

## 2016-12-19 DIAGNOSIS — Z5309 Procedure and treatment not carried out because of other contraindication: Secondary | ICD-10-CM

## 2016-12-19 DIAGNOSIS — I480 Paroxysmal atrial fibrillation: Secondary | ICD-10-CM

## 2016-12-19 DIAGNOSIS — K921 Melena: Secondary | ICD-10-CM

## 2016-12-19 DIAGNOSIS — I739 Peripheral vascular disease, unspecified: Secondary | ICD-10-CM

## 2016-12-19 DIAGNOSIS — I4891 Unspecified atrial fibrillation: Secondary | ICD-10-CM

## 2016-12-19 LAB — URINE CULTURE

## 2016-12-19 LAB — CBC
HEMATOCRIT: 27.8 % — AB (ref 36.0–46.0)
HEMOGLOBIN: 8.9 g/dL — AB (ref 12.0–15.0)
MCH: 29.1 pg (ref 26.0–34.0)
MCHC: 32 g/dL (ref 30.0–36.0)
MCV: 90.8 fL (ref 78.0–100.0)
Platelets: 369 10*3/uL (ref 150–400)
RBC: 3.06 MIL/uL — AB (ref 3.87–5.11)
RDW: 14.7 % (ref 11.5–15.5)
WBC: 12.5 10*3/uL — AB (ref 4.0–10.5)

## 2016-12-19 LAB — BASIC METABOLIC PANEL
ANION GAP: 8 (ref 5–15)
BUN: 11 mg/dL (ref 6–20)
CHLORIDE: 102 mmol/L (ref 101–111)
CO2: 25 mmol/L (ref 22–32)
Calcium: 9 mg/dL (ref 8.9–10.3)
Creatinine, Ser: 0.96 mg/dL (ref 0.44–1.00)
GFR calc Af Amer: >60 mL/min (ref >60)
GFR, EST NON AFRICAN AMERICAN: 52 mL/min — AB (ref >60)
GLUCOSE: 148 mg/dL — AB (ref 65–99)
POTASSIUM: 3.7 mmol/L (ref 3.5–5.1)
Sodium: 135 mmol/L (ref 135–145)

## 2016-12-19 LAB — ECHOCARDIOGRAM COMPLETE
Height: 62 in
Weight: 2033.6 oz

## 2016-12-19 MED ORDER — HYDROMORPHONE HCL 1 MG/ML IJ SOLN
0.5000 mg | INTRAMUSCULAR | Status: AC
  Administered 2016-12-19: 0.5 mg via INTRAVENOUS
  Filled 2016-12-19: qty 1

## 2016-12-19 MED ORDER — FUROSEMIDE 10 MG/ML IJ SOLN
INTRAMUSCULAR | Status: AC
  Filled 2016-12-19: qty 2

## 2016-12-19 MED ORDER — ADULT MULTIVITAMIN W/MINERALS CH
1.0000 | ORAL_TABLET | Freq: Every day | ORAL | Status: DC
  Administered 2016-12-19 – 2016-12-30 (×11): 1 via ORAL
  Filled 2016-12-19 (×11): qty 1

## 2016-12-19 MED ORDER — DEXTROSE 5 % IV SOLN
1.0000 g | INTRAVENOUS | Status: DC
  Administered 2016-12-19 – 2016-12-25 (×6): 1 g via INTRAVENOUS
  Filled 2016-12-19 (×7): qty 1

## 2016-12-19 MED ORDER — FUROSEMIDE 10 MG/ML IJ SOLN
20.0000 mg | Freq: Once | INTRAMUSCULAR | Status: AC
  Administered 2016-12-19: 20 mg via INTRAVENOUS

## 2016-12-19 NOTE — Care Management Note (Signed)
Case Management Note [name] RN, BSN Unit 2W-Case Manager [phone]  Patient Details  Name: [name] MRN: [id number] Date of Birth: [birth year]  Subjective/Objective:   Pt admitted s/p TMA                 Action/Plan: PTA pt lived at home- has supportive family- pt has pre-op referral to [facility]- order placed for HHPT- spoke with pt at bedside- discussed [facility] services- pt states she has had HH with [facility] in past and would like to continue services with them- notified Tiffany with [facility] of plan to return home with  Family and [facility] services. Pt would also like a rollator for home- will ask MD for DME order. Once order placed can have DME delivered to room prior to discharge.   Expected Discharge Date:                  Expected Discharge Plan:  [place]  In-House Referral:  Clinical Social Work  Discharge planning Services  CM Consult  Post Acute Care Choice:  Durable Medical Equipment, Home Health Choice offered to:  Patient  DME Arranged:  [place] rolling with seat DME Agency:     HH Arranged:  PT [place]:  [place]  Status of Service:  Completed, signed off  If discussed at [place] of Stay Meetings, dates discussed:  1/30, 2/1  Additional Comments:  12/18/16- 1430- [name] RN, CM- pt has had post op complications with pain and bleeding returned to OR for  I&D on 1/31-  CIR was consulted for possible admission- however doubtful pt can tolerate CIR therapies- PT/OT recommending SNF at this time-, CSW has been consulted for possible SNF placement- per [facility] mtg on 2/1- medical advisor made pt [place] with [facility] - pt would be a pass through on discharge from SNF, have notified Tiffany with [facility] of this.   Dawayne Laurence, RN 12/19/2016, 2:19 PM

## 2016-12-19 NOTE — Progress Notes (Signed)
Initial Nutrition Assessment  DOCUMENTATION CODES:   Not applicable  INTERVENTION:    Ensure Enlive po BID, each supplement provides 350 kcal and 20 grams of protein  MVI daily  NUTRITION DIAGNOSIS:   Increased nutrient needs related to wound healing as evidenced by estimated needs.  GOAL:   Patient will meet greater than or equal to 90% of their needs  MONITOR:   PO intake, Supplement acceptance  REASON FOR ASSESSMENT:   Malnutrition Screening Tool    ASSESSMENT:   83 year old female admitted on 1/25 with ischemic right great toe. S/P transmetatarsal amputation 1/25. Developed bleeding from amputation site and required wound exploration with cauterization 1/31.  Unable to complete Nutrition-Focused physical exam at this time.  Patient with 5% weight loss within the past 3 months. PO intake since admission has been poor with overall intake of meals < 50%. Patient with increased nutrition needs to support healing.  Will add PO supplements.  Diet Order:  Diet Heart Room service appropriate? Yes; Fluid consistency: Thin  Skin:  Reviewed, no issues (R foot incision)  Last BM:  1/31  Height:   Ht Readings from Last 1 Encounters:  12/18/16 5\' 2"  (1.575 m)    Weight:   Wt Readings from Last 1 Encounters:  12/19/16 127 lb 1.6 oz (57.7 kg)    Ideal Body Weight:  50 kg  BMI:  Body mass index is 23.25 kg/m.  Estimated Nutritional Needs:   Kcal:  [id number]  Protein:  75-85 gm  Fluid:  >/= 1.5 L  EDUCATION NEEDS:   No education needs identified at this time  Molli Barrows, [place], [place], [place] Pager [phone] After Hours Pager [phone]

## 2016-12-19 NOTE — Progress Notes (Signed)
Palliative Medicine Team consult was received.    Met patient and her daughter briefly this evening.  We scheduled a meeting with patient and her family for 10AM tomorrow.  When I saw patient, she was in pain after dressing change.  I ordered one time extra dose of her dilaudid.   If there are urgent needs or questions please call [phone]. Thank you for consulting out team to assist with this patients care.  Micheline Rough, MD [place] Palliative Medicine Team [phone]  NO CHARGE NOTE

## 2016-12-19 NOTE — Progress Notes (Signed)
Patient taking pain pill for foot and she went into At. Fib RVR for few seconds rate 150. Then back down to below 130's then below 120's Patient has a Hx of At. Fib. Then low 100's with no complaint. R,N, . Ask daughter to let her rest and turn lights off and see if it would come down and stay down. . And is sleeping comfort. And resp. Down to 45. Cont. To monitor patient and rhythm

## 2016-12-19 NOTE — Consult Note (Signed)
Cardiology Consult    Patient ID: [name] MRN: [id number], DOB/AGE: [birth year]   Admit date: 12/11/2016 Date of Consult: 12/19/2016  Primary Physician: Henrine Screws, MD Primary Cardiologist: Dr. Angelena Form Requesting Provider: [name]  Patient Profile    [name] is a 83 yo female with a PMH significant for PAD/PVD requiring popliteal artery bypass graft (2010, 2011) and left external iliac and left superficial femoral artery stenting (2016), and right superificial femoral artery stenting (2017); HTN, GI bleeding on eliquis (for Afib), and paroxysmal atrial fibrillation. She is in the hospital for right transmetatarsal amputation. Cardiology was consulted for atrial fibrillation.   Past Medical History   Past Medical History:  Diagnosis Date  . Anemia   . Anginal pain ([place])   . Arthritis    "qwhere" (09/05/2016)  . Atrial fibrillation ([place]) 09/2016  . Chronic lower back pain   . Complication of anesthesia    "takes a long time for it to wear off; I can hallucinate if I take too much" (09/05/2016)  . DVT (deep venous thrombosis) ([place]) 10/2009  . Fall from steps 08/31/2013   Fx. pelvis, Left Hip, Left Elbow  . Fibromyalgia   . GERD (gastroesophageal reflux disease)    09/22/16- "no too much anymore"  . GI bleed 10/24/2016  . High cholesterol   . History of blood transfusion   . History of hiatal hernia   . Hypertension   . Macular degeneration, wet ([place])    "started in right eye; now legally blind in that eye; now started in left eye but pretty much in control" (09/05/2016)  . Osteoporosis   . Peripheral vascular disease ([place])    nonviable tissue Right foot  . PONV (postoperative nausea and vomiting)   . Squamous cell carcinoma of skin of right calf Aug. 2015  . Stroke ([place])    TIA's no residual  . TIA (transient ischemic attack)    "several at once; none in a long time" (09/05/2016)    Past Surgical History:  Procedure Laterality Date  . ABDOMINAL  AORTAGRAM N/A 12/26/2014   Procedure: ABDOMINAL Maxcine Ham;  Surgeon: Serafina Mitchell, MD;  Location: [facility] CATH LAB;  Service: Cardiovascular;  Laterality: N/A;  . AORTOGRAM N/A 09/26/2016   Procedure: AORTOGRAM;  Surgeon: Waynetta Sandy, MD;  Location: [place];  Service: Vascular;  Laterality: N/A;  . CARPAL TUNNEL RELEASE Right   . CATARACT EXTRACTION W/ INTRAOCULAR LENS  IMPLANT, BILATERAL Bilateral   . COLONOSCOPY    . DILATION AND CURETTAGE OF UTERUS    . EYE SURGERY Right    "macular OR"  . FEMORAL ARTERY STENT  12-11-10   Left SFA  . FEMORAL-POPLITEAL BYPASS GRAFT Left 09/24/2016   Procedure: REDO FEMORAL TO POPLITEAL ARTERY BYPASS GRAFT USING 6MM PROPATEN RINGED GORTEX GRAFT;  Surgeon: Serafina Mitchell, MD;  Location: [place];  Service: Vascular;  Laterality: Left;  . I&D EXTREMITY Right 12/17/2016   Procedure: IRRIGATION AND DEBRIDEMENT RIGHT FOOT;  Surgeon: Serafina Mitchell, MD;  Location: [place];  Service: Vascular;  Laterality: Right;  . INCISION AND DRAINAGE OF WOUND Left 10/25/2009   leg/notes 11/13/2009  . INSERTION OF ILIAC STENT Left 12/26/2014   Procedure: INSERTION OF ILIAC STENT;  Surgeon: Serafina Mitchell, MD;  Location: [facility] CATH LAB;  Service: Cardiovascular;  Laterality: Left;  . INSERTION OF ILIAC STENT Left 09/26/2016   Procedure: SUB INTIMAL INSERTION OF SUPERFICIAL FEMORAL ARTERY AND BELOW KNEE BYPASS GRAFT;  Surgeon: Georgia Dom  Donzetta Matters, MD;  Location: [place];  Service: Vascular;  Laterality: Left;  . JOINT REPLACEMENT     knee  . JOINT REPLACEMENT Left Oct. 17, 2014   Elbow ( pt fell 08-31-13 )  . ORIF SHOULDER FRACTURE Right    "it was crushed"  . PERIPHERAL VASCULAR CATHETERIZATION N/A 10/30/2015   Procedure: Abdominal Aortogram w/Lower Extremity;  Surgeon: Serafina Mitchell, MD;  Location: [place] CV LAB;  Service: Cardiovascular;  Laterality: N/A;  . PERIPHERAL VASCULAR CATHETERIZATION  10/30/2015   Procedure: Peripheral Vascular Intervention;  Surgeon:  Serafina Mitchell, MD;  Location: [place] CV LAB;  Service: Cardiovascular;;  . PERIPHERAL VASCULAR CATHETERIZATION N/A 04/01/2016   Procedure: Abdominal Aortogram w/Lower Extremity;  Surgeon: Serafina Mitchell, MD;  Location: [place] CV LAB;  Service: Cardiovascular;  Laterality: N/A;  . PERIPHERAL VASCULAR CATHETERIZATION Left 04/01/2016   Procedure: Peripheral Vascular Atherectomy;  Surgeon: Serafina Mitchell, MD;  Location: [place] CV LAB;  Service: Cardiovascular;  Laterality: Left;  Superficial femoral artery.  Marland Kitchen PERIPHERAL VASCULAR CATHETERIZATION Right 09/05/2016   "stent"  . PERIPHERAL VASCULAR CATHETERIZATION N/A 09/05/2016   Procedure: Abdominal Aortogram w/Lower Extremity;  Surgeon: Serafina Mitchell, MD;  Location: [place] CV LAB;  Service: Cardiovascular;  Laterality: N/A;  . PERIPHERAL VASCULAR CATHETERIZATION Right 09/05/2016   Procedure: Peripheral Vascular Intervention;  Surgeon: Serafina Mitchell, MD;  Location: [place] CV LAB;  Service: Cardiovascular;  Laterality: Right;  Superficial Femoral  . PERIPHERAL VASCULAR CATHETERIZATION Left 09/09/2016   Procedure: Lower Extremity Angiography;  Surgeon: Serafina Mitchell, MD;  Location: [place] CV LAB;  Service: Cardiovascular;  Laterality: Left;  . PR VEIN BYPASS GRAFT,AORTO-FEM-POP  09-13-09   Left Fem-pop  . THROMBECTOMY FEMORAL ARTERY Right 09/26/2016   Procedure: Thromboembolectomy Right Lower Extremity, Right Femoral Artery Endarterectomy with Patch Angioplasty; Right Lower Extremity Angiogram ;  Surgeon: Waynetta Sandy, MD;  Location: [place];  Service: Vascular;  Laterality: Right;  . TOTAL ELBOW ARTHROPLASTY Left 09/03/2013   Procedure: LEFT TOTAL ELBOW ARTHROPLASTY;  Surgeon: Roseanne Kaufman, MD;  Location: [place];  Service: Orthopedics;  Laterality: Left;  . TOTAL KNEE ARTHROPLASTY Left 06/2006  . TRANSMETATARSAL AMPUTATION Right 12/11/2016   Procedure: TRANSMETATARSAL AMPUTATION;  Surgeon: Serafina Mitchell, MD;  Location: [place];  Service: Vascular;  Laterality: Right;  . TUBAL LIGATION    . VAGINAL HYSTERECTOMY       Allergies  Allergies  Allergen Reactions  . Motrin [Ibuprofen] Other (See Comments)    ADVERSE REACTION - GI BLEED  . Statins Other (See Comments)    ADVERSE REACTION MUSCLE PAIN & WEAKNESS  . Morphine And Related Other (See Comments)    HALLUCINATIONS REACTION IS SIDE EFFECT  . Oxycontin [Oxycodone Hcl] Other (See Comments)    [REACTION IS SIDE EFFECT]  Hallinatations  . Promethazine Hcl Other (See Comments)    IV  Drug only, makes her act crazy  . Sulfa Antibiotics Nausea And Vomiting    History of Present Illness    Ms Danz was found to have atrial fibrillation with RVR during this hospitalization for right transmetatarsal amputation. She presented for surgery on 12/11/16 and was taken back to OR on 12/17/16 for re-exploration. On 12/19/16, rapid response was called for respiratory distress, she was given one dose of lasix.  Pt was later found to be in atrial fibrillation RVR in the 150s. She also has a UTI currently treated with cefepime. Cardiology was consulted for atrial fibrillation.  Inpatient Medications    . ceFEPime (MAXIPIME) IV  1 g Intravenous Q24H  . clopidogrel  75 mg Oral Daily  . enoxaparin (LOVENOX) injection  30 mg Subcutaneous Q24H  . feeding supplement (ENSURE ENLIVE)  237 mL Oral BID BM  . gabapentin  600 mg Oral TID  . hydrochlorothiazide  6.25 mg Oral Daily  . irbesartan  75 mg Oral Daily  . metoprolol  50 mg Oral Daily  . metoprolol  25 mg Oral QHS  . pantoprazole  40 mg Oral Daily     Outpatient Medications    Prior to Admission medications   Medication Sig Start Date End Date Taking? Authorizing Provider  acetaminophen (TYLENOL) 500 MG tablet Take 1,000 mg by mouth 2 (two) times daily as needed for moderate pain or headache. Patient took this medication for her pain.   Yes Historical Provider, MD  cephALEXin (KEFLEX) 500  MG capsule Take 1 capsule (500 mg total) by mouth 3 (three) times daily. 12/01/16  Yes Serafina Mitchell, MD  Cholecalciferol (VITAMIN D3) 2000 UNITS TABS Take 2,000 Units by mouth at bedtime.    Yes Historical Provider, MD  clopidogrel (PLAVIX) 75 MG tablet Take 75 mg by mouth daily.   Yes Historical Provider, MD  Cyanocobalamin (VITAMIN B-12 PO) Take 1 tablet by mouth daily.   Yes Historical Provider, MD  diazepam (VALIUM) 5 MG tablet Take 2.5 mg by mouth at bedtime as needed for anxiety.    Yes Historical Provider, MD  gabapentin (NEURONTIN) 300 MG capsule Take 2 capsules (600 mg total) by mouth 2 (two) times daily. 10/03/16  Yes Alvia Grove, PA-C  HYDROcodone-acetaminophen (NORCO/VICODIN) 5-325 MG tablet Take 1-2 tablets by mouth every 4 (four) hours as needed for moderate pain. 12/08/16  Yes Serafina Mitchell, MD  metoprolol (LOPRESSOR) 50 MG tablet Take 1 tablet (50 mg total) by mouth 2 (two) times daily. Take 50mg  by mouth in the morning and take 25mg  by mouth at bedtime. Patient taking differently: Take 50 mg by mouth 2 (two) times daily.  10/26/16  Yes Orson Eva, MD  Multiple Vitamins-Minerals (EYE VITAMINS PO) Take 1 tablet by mouth at bedtime.    Yes Historical Provider, MD  pantoprazole (PROTONIX) 40 MG tablet Take 1 tablet (40 mg total) by mouth daily. 10/25/16  Yes Orson Eva, MD  valsartan-hydrochlorothiazide (DIOVAN-HCT) 160-12.5 MG per tablet Take 0.5 tablets by mouth daily.   Yes Historical Provider, MD  loperamide (IMODIUM) 2 MG capsule Take 2 mg by mouth as needed for diarrhea or loose stools.    Historical Provider, MD     Family History    Family History  Problem Relation Age of Onset  . Heart disease Father     Heart Disease before age 71  . Hyperlipidemia Father   . Hypertension Father   . Alcohol abuse Father   . Heart disease Brother   . Hyperlipidemia Brother   . Hypertension Brother   . Deep vein thrombosis Brother   . Heart disease Son     Heart Disease before  age 29  . Hyperlipidemia Son   . Hypertension Son   . Heart attack Son   . Diabetes Son   . Hypertension Son   . Hyperlipidemia Sister   . Hypertension Sister     Social History    Social History   Social History  . Marital status: Married    Spouse name: N/A  . Number of children: N/A  . Years of  education: N/A   Occupational History  . Not on file.   Social History Main Topics  . Smoking status: Former Smoker    Types: Cigarettes    Quit date: 11/17/1946  . Smokeless tobacco: Never Used     Comment: "never smoked much"  . Alcohol use No  . Drug use: No  . Sexual activity: Not on file   Other Topics Concern  . Not on file   Social History Narrative  . No narrative on file     Review of Systems    General:  No chills, fever, night sweats or weight changes.  Cardiovascular:  No chest pain, dyspnea on exertion, edema, orthopnea, palpitations, paroxysmal nocturnal dyspnea. Dermatological: No rash, lesions/masses Respiratory: No cough, dyspnea Urologic: No hematuria, dysuria Abdominal:   No nausea, vomiting, diarrhea, bright red blood per rectum, melena, or hematemesis Neurologic:  No visual changes, wkns, changes in mental status. Positive for right LE pain All other systems reviewed and are otherwise negative except as noted above.  Physical Exam    Blood pressure 124/78, pulse 81, temperature 98.6 F (37 C), temperature source Oral, resp. rate 18, height 5\' 2"  (1.575 m), weight 127 lb 1.6 oz (57.7 kg), SpO2 97 %.  General: Pleasant, NAD Psych: Normal affect. Neuro: Alert and oriented X 3. Moves all extremities spontaneously. HEENT: Normal  Neck: Supple without bruits or JVD. Lungs:  Resp regular and unlabored, CTA. Heart: RRR no murmurs. Abdomen: Soft, non-tender, non-distended, BS + x 4.  Extremities: No clubbing, cyanosis or edema. Left pedal pulse diminished, dressing on right LE  Labs    Troponin (Point of Care Test) No results for input(s):  TROPIPOC in the last 72 hours. No results for input(s): CKTOTAL, CKMB, TROPONINI in the last 72 hours. Lab Results  Component Value Date   WBC 10.1 12/18/2016   HGB 8.2 (L) 12/18/2016   HCT 24.9 (L) 12/18/2016   MCV 92.2 12/18/2016   PLT 245 12/18/2016    Recent Labs Lab 12/18/16 0634  NA 134*  K 3.5  CL 104  CO2 23  BUN 11  CREATININE 1.00  CALCIUM 8.6*  GLUCOSE 105*   Lab Results  Component Value Date   CHOL  02/11/2009    183        ATP III CLASSIFICATION:  <200     mg/dL   Desirable  200-239  mg/dL   Borderline High  >=240    mg/dL   High          HDL 28 (L) 02/11/2009   LDLCALC  02/11/2009    87        Total Cholesterol/HDL:CHD Risk Coronary Heart Disease Risk Table                     Men   Women  1/2 Average Risk   3.4   3.3  Average Risk       5.0   4.4  2 X Average Risk   9.6   7.1  3 X Average Risk  23.4   11.0        Use the calculated Patient Ratio above and the CHD Risk Table to determine the patient's CHD Risk.        ATP III CLASSIFICATION (LDL):  <100     mg/dL   Optimal  100-129  mg/dL   Near or Above  Optimal  130-159  mg/dL   Borderline  160-189  mg/dL   High  >190     mg/dL   Very High   TRIG 342 (H) 02/11/2009   No results found for: [facility]   Radiology Studies    Dg Chest Port 1 View  Result Date: 12/19/2016 CLINICAL DATA:  Shortness of breath. EXAM: PORTABLE CHEST 1 VIEW COMPARISON:  Radiographs 09/26/2016 FINDINGS: The heart is enlarged. There is atherosclerosis of the thoracic aorta. Hazy bibasilar opacities likely combination of pleural fluid and atelectasis. There is vascular congestion without overt edema. No pneumothorax. Right shoulder prosthesis is partially included. IMPRESSION: Bibasilar opacities likely combination of pleural fluid and atelectasis. Cardiomegaly and vascular congestion. Findings consistent with CHF. Electronically Signed   By: Jeb Levering M.D.   On: 12/19/2016 01:38    ECG & Cardiac  Imaging    EKG 12/19/16: NSR  Telemetry: NSR in the 80s  Assessment & Plan    1. Atrial fibrillation - had Afib RVR on previous hospitalization which converted with lopressor. She had a GI bleed on eliquis requiring hospitalization and is now only on ASA and plavix. - This patients CHA2DS2-VASc Score and unadjusted Ischemic Stroke Rate (% per year) is equal to 11.2 % stroke rate/year from a score of 7 (female, age, HTN, thromboembolism, vascular disease) - per telemetry, now in NSR in the 16s - continue home dose of lopressor 50 mg BID - Afib RVR likely secondary to stressful surgery with takeback and in the presence of infection (UTI) - if Afib RVR returns, may consider increasing lopressor dose or frequency as pressure allows for better coverage - she has previously failed anticoagulation and will likely not be a candidate at this time. Will hold off on starting heparin gtt at this time - will discuss with attending  2. Chest pain - Previous lexiscan myoview 09/19/16 was without ischemia. - patient denies current chest pain - chest discomfort that may have been reported this morning likely secondary to Afib RVR  Signed, Minette Brine, PA-C 12/19/2016, 11:10 AM   The patient was seen, examined and discussed with Minette Brine , PA-C and I agree with the above. '  83 yo female with a PMH significant for PAD/PVD requiring popliteal artery bypass graft (2010, 2011) and left external iliac and left superficial femoral artery stenting (2016), and right superificial femoral artery stenting (2017); HTN, and  paroxysmal atrial fibrillation during previous hospitalization in 10/2016. She had a GI bleed on eliquis requiring hospitalization and is now only on ASA and plavix.  She is in the hospital for right transmetatarsal amputation. She developed a-fib with RVR on 2/2 at 1 am with an acute respiratory failure. He was febrile at the time 102.5 F. Lasix 20 mg iv was given for acute CHF, diuresed 1 L  in 6 hours. Sterted on Cefepime iv.  She is mildly fluid overloaded, we will give one lasix 20 mg iv q12h x 2 doses. She cardioverted spontaneously into the SR at noon today.  She is currently in SR. We will order an echocardiogram to evaluate LVEF and left atrial size.  It appears that a-fib episode is being triggered by stress - surgery, fever, but she cardioverts quickly. I would treat the underlying disease, but if she has recurrent episodes of a-fib, consider starting amiodarone for rhythm control.   Ena Dawley, MD 12/19/2016

## 2016-12-19 NOTE — Progress Notes (Signed)
Resp. Therapy called and ask to come and assess patient breathing. They suggested CXR last one done last year. [facility] R.N. Called and made aware of tonight's events and order Stat P.CXR and will come see patient.That was ordered around 1:17. Awaiting CXR results.

## 2016-12-19 NOTE — Consult Note (Signed)
   [facility] CM Inpatient Consult   12/19/2016  [name] 1928/06/19 [id number]   Patient assess for [facility] Care Management needs as in the [facility] ACO Registry. This is the patient's 3rd admission.   Chart review reveals the Pt is an 83 y.o. female s/p RIGHT TRANSMETATARSAL AMPUTATION. Post-op course complicated by copious bleeding from Rt foot; return to OR for I&D and pain control issues. PMHx: Anginal pain, Arthritis, Chronic lower back pain, DVT, Fibromyalgia, GERD, High cholesterol, HTN, Macular degeneration, PVD, TIA. Per PT note today.  Patient is having a lot of pain and not able to tolerate a great deal of pain.  Patient's current disposition is for skilled nursing for post hospital.  Will follow for disposition and needs as appropriate for [name].   Spoke with inpatient RNCM, Kristi of patient's eligibility.  For questions, please cotact:  Natividad Brood, RN BSN [place] Liaison  [phone] business mobile phone Toll free office [phone]

## 2016-12-19 NOTE — Progress Notes (Signed)
B.P. 181/63  P-94 R-36 using abdo. Muscles  Rm. Air sats 92% Applied O2 at 2 l/m n.c. sats now 97%. R.N. Aware and Dr. Donzetta Matters on call and paged and call return at 00:51 He was made aware of the above and today's events. See orders .

## 2016-12-19 NOTE — Progress Notes (Signed)
Physical Therapy Treatment Patient Details Name: [name] MRN: [id number] DOB: Aug 12, 1928 Today's Date: 12/19/2016    History of Present Illness Pt is an 83 y.o. female s/p RIGhT TRANSMETATARSAL AMPUTATION. Post-op course complicated by copious bleeding from Rt foot; return to OR for I&D and pain control issues. PMHx: Anginal pain, Arthritis, Chronic lower back pain, DVT, Fibromyalgia, GERD, High cholesterol, HTN, Macular degeneration, PVD, TIA.    PT Comments    Patient able to tolerate OOB to chair with increased time due to pain (despite pre-medication), elevated HR (afib 108-128), and anxiety/fear. Unfortunately medical complications and pain control issues have slowed patient's progress with therapies. Currently feel SNF would be best option for continued therapy post-acute stay. Discussed with Mel Almond, OT and in agreement.    Follow Up Recommendations  SNF;Supervision/Assistance - 24 hour (If pain control improves along with medical stability, could again be a CIR candidate)     Equipment Recommendations  None recommended by PT    Recommendations for Other Services       Precautions / Restrictions Precautions Precautions: Fall;Other (comment) Precaution Comments: daughter reports pt not to push or pull with Lt elbow more than 5 lbs (s/p total elbow replacement) Required Braces or Orthoses: Other Brace/Splint Other Brace/Splint: R darco shoe Restrictions Weight Bearing Restrictions: Yes RLE Weight Bearing: Partial weight bearing (WB through heel with darco shoe)    Mobility  Bed Mobility Overal bed mobility: Needs Assistance Bed Mobility: Supine to Sit     Supine to sit: Mod assist     General bed mobility comments: Assist for trunk elevation from supine to sit and scooting hips out to EOB. Increased time required. HOB flat with use of bed rail  Transfers Overall transfer level: Needs assistance Equipment used: 2 person hand held assist (bed pad under pelvis  for support) Transfers: Sit to/from [facility] Sit to Stand: Max assist;+2 physical assistance Stand pivot transfers: Max assist;+2 physical assistance       General transfer comment: Boost up with use of bed pad and gait belt. Cues for hand placement and technique. Assist for weight shift and advancing RLE to step towards chair  Ambulation/Gait                 Stairs            Wheelchair Mobility    Modified Rankin (Stroke Patients Only)       Balance Overall balance assessment: Needs assistance Sitting-balance support: Bilateral upper extremity supported Sitting balance-Leahy Scale: Fair     Standing balance support: Bilateral upper extremity supported Standing balance-Leahy Scale: Zero                      Cognition Arousal/Alertness: Awake/alert Behavior During Therapy: Anxious;WFL for tasks assessed/performed Overall Cognitive Status: Within Functional Limits for tasks assessed                      Exercises      General Comments General comments (skin integrity, edema, etc.): daughter present throughout. States she will not leave the room while pt OOB      Pertinent Vitals/Pain Pain Assessment: Faces Faces Pain Scale: Hurts whole lot Pain Location: R heel  Pain Descriptors / Indicators: Grimacing;Guarding;Moaning;Aching Pain Intervention(s): Monitored during session;Limited activity within patient's tolerance;Premedicated before session;Repositioned;Relaxation    Home Living                      Prior Function  PT Goals (current goals can now be found in the care plan section) Acute Rehab PT Goals Patient Stated Goal: decrease pain Time For Goal Achievement: 12/26/16 Progress towards PT goals: Progressing toward goals (slowly due to medical complications)    Frequency    Min 3X/week      PT Plan Discharge plan needs to be updated    Co-evaluation PT/OT/SLP  Co-Evaluation/Treatment: Yes Reason for Co-Treatment: For patient/therapist safety PT goals addressed during session: Mobility/safety with mobility;Balance OT goals addressed during session: Other (comment);ADL's and self-care (functional transfers)     End of Session Equipment Utilized During Treatment: Gait belt Activity Tolerance: Patient limited by pain;Other (comment) (by anxiety/fear (of pain and of falling)) Patient left: in chair;with call bell/phone within reach;with family/visitor present     Time: 0805-0829 PT Time Calculation (min) (ACUTE ONLY): 24 min  Charges:  $Therapeutic Activity: 8-22 mins                    G Codes:      [employer], PT 12/21/16, 9:43 AM

## 2016-12-19 NOTE — Progress Notes (Signed)
Pharmacy Antibiotic Note  [name] is a 83 y.o. female admitted on 12/11/2016 with UTI.  Pharmacy has been consulted for cefepime dosing.  Spiking fevers. Did have urine cx drawn on 1/30 that shows pseudomonas. Discussed with provider and will switch abx. Tmax of 102.5 yesterday, WBC wnl. SCr stable, CrCl ~48ml/min.  Plan: Stop cefuroxime Start cefepime 1g IV Q24 Monitor clinical picture, renal function F/U C&S, abx deescalation / LOT   Height: 5\' 2"  (157.5 cm) Weight: 127 lb 1.6 oz (57.7 kg) IBW/kg (Calculated) : 50.1  Temp (24hrs), Avg:99.6 F (37.6 C), Min:98.3 F (36.8 C), Max:102.5 F (39.2 C)   Recent Labs Lab 12/13/16 0254 12/15/16 0315 12/16/16 0732 12/17/16 0432 12/18/16 0634  WBC 6.7 11.4* 12.8* 12.2* 10.1  CREATININE 1.24*  --  0.85 0.94 1.00    Estimated Creatinine Clearance: 31.9 mL/min (by C-G formula based on SCr of 1 mg/dL).    Allergies  Allergen Reactions  . Motrin [Ibuprofen] Other (See Comments)    ADVERSE REACTION - GI BLEED  . Statins Other (See Comments)    ADVERSE REACTION MUSCLE PAIN & WEAKNESS  . Morphine And Related Other (See Comments)    HALLUCINATIONS REACTION IS SIDE EFFECT  . Oxycontin [Oxycodone Hcl] Other (See Comments)    [REACTION IS SIDE EFFECT]  Hallinatations  . Promethazine Hcl Other (See Comments)    IV  Drug only, makes her act crazy  . Sulfa Antibiotics Nausea And Vomiting    Antimicrobials this admission: Cefuroxime 1/25 >> 1/26; 1/30 >> 2/2 Cephalexin 1/28 >> 1/30 Cefepime 2/2 >>  Dose adjustments this admission: n/a  Microbiology results: 1/30 UCx: Pseudomonas   Thank you for allowing pharmacy to be a part of this patient's care.  Elenor Quinones, PharmD, BCPS Clinical Pharmacist Pager [phone] 12/19/2016 9:04 AM

## 2016-12-19 NOTE — Progress Notes (Signed)
Occupational Therapy Treatment Patient Details Name: [name] MRN: [id number] DOB: 1928-03-21 Today's Date: 12/19/2016    History of present illness Pt is an 83 y.o. female s/p RIGHT TRANSMETATARSAL AMPUTATION. PMHx: Anginal pain, Arthritis, Chronic lower back pain, DVT, Fibromyalgia, GERD, High cholesterol, HTN, Macular degeneration, PVD, TIA.   OT comments  Pt tolerated OOB activity today but continues to report severe pain in RLE with movement despite premedication. Pt required max assist +2 for stand pivot transfer. No bleeding noted from incision site with dependent position. Updated d/c plan to SNF at this time. Pending progress and pain control, CIR may be an option for post acute rehab. Will continue to follow acutely.   Follow Up Recommendations  SNF;Supervision/Assistance - 24 hour (pending progress/pain control, possibly CIR)    Equipment Recommendations  Other (comment) (TBD)    Recommendations for Other Services      Precautions / Restrictions Precautions Precautions: Fall Required Braces or Orthoses: Other Brace/Splint Other Brace/Splint: R darco shoe Restrictions Weight Bearing Restrictions: Yes RLE Weight Bearing:  (WB through heel with darco shoe)       Mobility Bed Mobility Overal bed mobility: Needs Assistance Bed Mobility: Supine to Sit     Supine to sit: Mod assist     General bed mobility comments: Assist for trunk elevation from supine to sit and scooting hips out to EOB. Increased time required. HOB flat with use of bed rail  Transfers Overall transfer level: Needs assistance Equipment used: 2 person hand held assist Transfers: Sit to/from [facility] Sit to Stand: Max assist;+2 physical assistance Stand pivot transfers: Max assist;+2 physical assistance       General transfer comment: Boost up with use of bed pad and gait belt. Cues for hand placement and technique.    Balance Overall balance assessment: Needs  assistance Sitting-balance support: Bilateral upper extremity supported Sitting balance-Leahy Scale: Fair     Standing balance support: Bilateral upper extremity supported Standing balance-Leahy Scale: Zero                     ADL Overall ADL's : Needs assistance/impaired                     Lower Body Dressing: Total assistance Lower Body Dressing Details (indicate cue type and reason): to don sock and darco shoe Toilet Transfer: Maximal assistance;+2 for physical assistance;Stand-pivot Toilet Transfer Details (indicate cue type and reason): Simulated by stand pivot from EOB to chair         Functional mobility during ADLs: Maximal assistance;+2 for physical assistance (for stand pivot only) General ADL Comments: No incisional bleeding noted with R foot in dependent position. SpO2 high 80s to low 90s on 2L supplemental O2.      Vision                     Perception     Praxis      Cognition   Behavior During Therapy: Anxious;WFL for tasks assessed/performed Overall Cognitive Status: Within Functional Limits for tasks assessed                       Extremity/Trunk Assessment               Exercises     Shoulder Instructions       General Comments      Pertinent Vitals/ Pain       Pain Assessment: Faces Faces  Pain Scale: Hurts whole lot Pain Location: R heel  Pain Descriptors / Indicators: Grimacing;Guarding;Moaning;Aching Pain Intervention(s): Monitored during session;Limited activity within patient's tolerance;Premedicated before session;Repositioned;Patient requesting pain meds-RN notified  Home Living                                          Prior Functioning/Environment              Frequency  Min 2X/week        Progress Toward Goals  OT Goals(current goals can now be found in the care plan section)  Progress towards OT goals: Progressing toward goals  Acute Rehab OT Goals Patient  Stated Goal: decrease pain OT Goal Formulation: With patient/family  Plan Discharge plan needs to be updated    Co-evaluation    PT/OT/SLP Co-Evaluation/Treatment: Yes Reason for Co-Treatment: For patient/therapist safety   OT goals addressed during session: Other (comment);ADL's and self-care (functional transfers)      End of Session Equipment Utilized During Treatment: Gait belt;Oxygen   Activity Tolerance Patient limited by pain;Patient tolerated treatment well   Patient Left in chair;with call bell/phone within reach;with family/visitor present   Nurse Communication Mobility status;Need for lift equipment        Time: [phone] OT Time Calculation (min): 23 min  Charges: OT General Charges $OT Visit: 1 Procedure OT Treatments $Self Care/Home Management : 8-22 mins  Binnie Kand M.S., OTR/L Pager: [phone]  12/19/2016, 8:43 AM

## 2016-12-19 NOTE — Progress Notes (Signed)
CXR results back and will page Dr. Donzetta Matters. Nevin Bloodgood  R.N. Is aware of results.Dr. Donzetta Matters aware and see orders for Lasix 20 mg I.V. And Dec. I.V. Fl. To 10/ml hr. Patient also starting having some mild Chest pressure Mid Chest non radating Skin warm and dry unable  to give me a number on pain scale.Did not last long.EKG done per [facility] request NSR at this time. Dr. Donzetta Matters is aware and order to put patient back on tele.

## 2016-12-19 NOTE — Significant Event (Addendum)
Rapid Response Event Note  Overview: Time Called: 0110 Arrival Time: 0115 Event Type: Respiratory  Initial Focused Assessment:  Called by Threasa Beards, RN with concerns for pt respiratory status with RR 36, Temp 102.5.  On arrival to room pt is alert, oriented, offers no c/o SOB,  Nor chest pain.  Skin is hot to touch.  181/63  100 HR.  97% 02 sats on 2l [place]. Crackles on Left Lower lobe, diminished BS RLL   Interventions: Tylenol 1000 mg  po given per order, Blood cultures drawn per MD order, Labetolol 10 mg po per MD order  PCXR: Bibasilar opacities likely combination of pleural fluid and atelectasis.  Cardiomegaly and vascular congestion c/w CHF  Place on continuous pulse ox, encourage cough and deep breathing.  Melanie, RN to notify Dr Gwenlyn Saran of CXR results 0133  BP 153/58  HR 97  RR 34  Plan of Care (if not transferred): Monitor respiratory status maintaining O2 sats >93%.  Hand off report to [facility], Therapist, sports.  Will follow as needed  0210  Daughter at bedside reports pt is c/o chest pain.  At bedside pt states she currently  is chest pain free but describes pain as midsternal, non radiating, pressure-like, unable to give numerical rating for previous pain.  12lead EKG done indicating NSR.  Results of CXR and EKG called to Dr Vella Redhead  by Threasa Beards, RN  Order received for Lasix 20 mg IV and KVO fluids.   Hand off to [facility] to monitor response to Lasix, place pt on telemetry,  And call as needed.  37  Called by Threasa Beards, RN that pt had gone into Afib with RVR rate 120-130  at 0229, currently rate 110s.Pt stable and asleep.  12 lead EKG requested and to notify MD.  Will follow.    Event Summary:   at      at          Colp, Gust Brooms

## 2016-12-19 NOTE — Progress Notes (Signed)
  Echocardiogram 2D Echocardiogram has been performed.  Tresa Res 12/19/2016, 4:29 PM

## 2016-12-19 NOTE — Progress Notes (Signed)
Rehab admissions - Noted PT/OT now recommending SNF placement.  Patient has not tolerated therapies well.  May need SNF for slower therapies and longer time for recovery.  Call me for questions  [phone]

## 2016-12-19 NOTE — Progress Notes (Signed)
Temp, taken by N.T. At daughter's request 102.5

## 2016-12-19 NOTE — Progress Notes (Signed)
[facility] [place]  Recent event:      1:28 am pt respiratory status with RR 36, Temp 102.5.  On arrival to room pt is alert, oriented, offers no c/o SOB,  Nor chest pain.  Skin is hot to touch.  181/63  100 HR.  97% 02 sats on 2l [place]. Crackles on Left Lower lobe, diminished BS RLL.   Interventions: Tylenol 1000 mg  po given per order, Blood cultures drawn per MD order, Labetolol 10 mg po per MD order  PCXR: Bibasilar opacities likely combination of pleural fluid and atelectasis.  Cardiomegaly and vascular congestion c/w CHF  Place on continuous pulse ox, encourage cough and deep breathing.  Not woken up due to pain since 2:30 am  Objective (!) 147/62 98 98.3 F (36.8 C) (Oral) (!) 25 98%  Intake/Output Summary (Last 24 hours) at 12/19/16 0721 Last data filed at 12/19/16 [id number]  Gross per 24 hour  Intake              640 ml  Output             1660 ml  Net            -1020 ml   Heart A fiib current rate 104, Rate as high as 150 last night Lungs decreased bas sound, O2 [place] Right foot dressing clean and dry  Assessment/Planning: POD #11I& D exploration of right amputation site, 7 Right transmetatarsal amputation  Foley still in place IV antibiotics for UTI.  Cr 1.0 12/18/2016 pending labs today  Will call cardiology consult for A fib.  She was on Eliquis  10/25/2016 admission:  During that admission, the patient developed paroxysmal atrial fibrillation and was discharged home on apixiban in addition to her aspirin and Plavix. On 10/22/2016, the patient had dark stools at home. She went to see her primary care provider. During her office visit, the patient had an episode of hematochezia. After discussion with vascular surgery and gastroenterology, aspirin and apixiban were discontinued, but the patient was continued on Plavix secondary to her recent stent.  Lungs DG chest 12/19/2016 IMPRESSION: Bibasilar opacities likely combination of pleural fluid  and atelectasis. Cardiomegaly and vascular congestion. Findings consistent with CHF. IV fluids decreased to 10 cc/hr, Lasix ordered 20mg  given 1 dose.   Laurence Slate [facility] 12/19/2016 7:21 AM --  Laboratory Lab Results:  Recent Labs  12/17/16 0432 12/18/16 0634  WBC 12.2* 10.1  HGB 8.1* 8.2*  HCT 25.0* 24.9*  PLT 222 245   BMET  Recent Labs  12/17/16 0432 12/18/16 0634  NA 132* 134*  K 3.6 3.5  CL 105 104  CO2 23 23  GLUCOSE 108* 105*  BUN 11 11  CREATININE 0.94 1.00  CALCIUM 8.5* 8.6*    COAG Lab Results  Component Value Date   INR 1.00 10/23/2016   INR 1.01 09/24/2016   INR 1.01 09/02/2013   No results found for: PTT

## 2016-12-20 DIAGNOSIS — G8918 Other acute postprocedural pain: Secondary | ICD-10-CM

## 2016-12-20 DIAGNOSIS — Z515 Encounter for palliative care: Secondary | ICD-10-CM

## 2016-12-20 DIAGNOSIS — Z7189 Other specified counseling: Secondary | ICD-10-CM

## 2016-12-20 LAB — CBC
HCT: 24.5 % — ABNORMAL LOW (ref 36.0–46.0)
HEMOGLOBIN: 8 g/dL — AB (ref 12.0–15.0)
MCH: 29.5 pg (ref 26.0–34.0)
MCHC: 32.7 g/dL (ref 30.0–36.0)
MCV: 90.4 fL (ref 78.0–100.0)
Platelets: 349 10*3/uL (ref 150–400)
RBC: 2.71 MIL/uL — AB (ref 3.87–5.11)
RDW: 14.9 % (ref 11.5–15.5)
WBC: 11.5 10*3/uL — AB (ref 4.0–10.5)

## 2016-12-20 LAB — BASIC METABOLIC PANEL
ANION GAP: 7 (ref 5–15)
BUN: 12 mg/dL (ref 6–20)
CHLORIDE: 102 mmol/L (ref 101–111)
CO2: 26 mmol/L (ref 22–32)
Calcium: 8.9 mg/dL (ref 8.9–10.3)
Creatinine, Ser: 0.91 mg/dL (ref 0.44–1.00)
GFR calc Af Amer: >60 mL/min (ref >60)
GFR calc non Af Amer: 56 mL/min — ABNORMAL LOW (ref >60)
Glucose, Bld: 112 mg/dL — ABNORMAL HIGH (ref 65–99)
POTASSIUM: 3.7 mmol/L (ref 3.5–5.1)
SODIUM: 135 mmol/L (ref 135–145)

## 2016-12-20 MED ORDER — SODIUM CHLORIDE 0.9% FLUSH: 9.0000 mL | INTRAVENOUS | Status: DC | PRN

## 2016-12-20 MED ORDER — NALOXONE HCL 0.4 MG/ML IJ SOLN: 0.4000 mg | INTRAMUSCULAR | Status: DC | PRN

## 2016-12-20 MED ORDER — HYDROMORPHONE HCL 1 MG/ML IJ SOLN
0.5000 mg | INTRAMUSCULAR | Status: DC | PRN
  Administered 2016-12-20 – 2016-12-29 (×25): 0.5 mg via INTRAVENOUS
  Filled 2016-12-20 (×26): qty 1

## 2016-12-20 MED ORDER — FUROSEMIDE 10 MG/ML IJ SOLN
INTRAMUSCULAR | Status: AC
  Administered 2016-12-20: 20 mg via INTRAVENOUS
  Filled 2016-12-20: qty 2

## 2016-12-20 MED ORDER — HYDROMORPHONE HCL 1 MG/ML IJ SOLN
0.5000 mg | INTRAMUSCULAR | Status: AC
  Administered 2016-12-20: 0.5 mg via INTRAVENOUS

## 2016-12-20 MED ORDER — HYDROMORPHONE BOLUS VIA INFUSION
0.5000 mg | INTRAVENOUS | Status: DC | PRN
  Filled 2016-12-20: qty 1

## 2016-12-20 MED ORDER — DIPHENHYDRAMINE HCL 12.5 MG/5ML PO ELIX: 12.5000 mg | ORAL_SOLUTION | Freq: Four times a day (QID) | ORAL | Status: DC | PRN

## 2016-12-20 MED ORDER — HYDROMORPHONE HCL 1 MG/ML IJ SOLN
INTRAMUSCULAR | Status: AC
  Administered 2016-12-20: 0.5 mg via INTRAVENOUS
  Filled 2016-12-20: qty 1

## 2016-12-20 MED ORDER — HYDROMORPHONE 1 MG/ML IV SOLN
INTRAVENOUS | Status: DC
  Administered 2016-12-20: 0.3 mg via INTRAVENOUS
  Administered 2016-12-20: 25 mg via INTRAVENOUS
  Administered 2016-12-21 (×2): 2.8 mg via INTRAVENOUS
  Administered 2016-12-21: 2 mg via INTRAVENOUS
  Administered 2016-12-22: 0 mg via INTRAVENOUS
  Administered 2016-12-22: 0.7 mg via INTRAVENOUS
  Administered 2016-12-22: 1.4 mg via INTRAVENOUS
  Administered 2016-12-22: 07:00:00 via INTRAVENOUS
  Administered 2016-12-22: 0.2 mg via INTRAVENOUS
  Administered 2016-12-23: 1.59 mg via INTRAVENOUS
  Administered 2016-12-23: 0.6 mg via INTRAVENOUS
  Administered 2016-12-23: 2.4 mg via INTRAVENOUS
  Administered 2016-12-24: 1.6 mg via INTRAVENOUS
  Administered 2016-12-24: 0.8 mg via INTRAVENOUS
  Administered 2016-12-24: 1.2 mg via INTRAVENOUS
  Filled 2016-12-20 (×3): qty 25

## 2016-12-20 MED ORDER — FUROSEMIDE 10 MG/ML IJ SOLN
20.0000 mg | Freq: Once | INTRAMUSCULAR | Status: AC
  Administered 2016-12-20: 20 mg via INTRAVENOUS

## 2016-12-20 MED ORDER — SENNA 8.6 MG PO TABS
1.0000 | ORAL_TABLET | Freq: Two times a day (BID) | ORAL | Status: DC
  Administered 2016-12-20 – 2016-12-30 (×16): 8.6 mg via ORAL
  Filled 2016-12-20 (×20): qty 1

## 2016-12-20 MED ORDER — ONDANSETRON HCL 4 MG/2ML IJ SOLN: 4.0000 mg | Freq: Four times a day (QID) | INTRAMUSCULAR | Status: DC | PRN

## 2016-12-20 MED ORDER — DIPHENHYDRAMINE HCL 50 MG/ML IJ SOLN: 12.5000 mg | Freq: Four times a day (QID) | INTRAMUSCULAR | Status: DC | PRN

## 2016-12-20 NOTE — Progress Notes (Signed)
DAILY PROGRESS NOTE  Subjective:  Diuresed yesterday, but still 3L positive. Mild basilar crackles. Maintaining sinus. Echo shows normal LV function with mild to moderate AI, mild MR and mild LAE. Palliative care involved for pain management.  Objective:  Temp:  [98.8 F (37.1 C)-99.1 F (37.3 C)] 99.1 F (37.3 C) (02/03 0444) Pulse Rate:  [86-87] 87 (02/03 0444) Resp:  [18-20] 18 (02/03 0444) BP: (132-167)/(52-68) 167/61 (02/03 0444) SpO2:  [93 %-95 %] 94 % (02/03 0444) Weight change:   Intake/Output from previous day: 02/02 0701 - 02/03 0700 In: 4369.5 [P.O.:435; I.V.:3534.5; IV Piggyback:50] Out: 1300 [Urine:1300]  Intake/Output from this shift: No intake/output data recorded.  Medications: No current facility-administered medications on file prior to encounter.    Current Outpatient Prescriptions on File Prior to Encounter  Medication Sig Dispense Refill  . acetaminophen (TYLENOL) 500 MG tablet Take 1,000 mg by mouth 2 (two) times daily as needed for moderate pain or headache. Patient took this medication for her pain.    . cephALEXin (KEFLEX) 500 MG capsule Take 1 capsule (500 mg total) by mouth 3 (three) times daily. 30 capsule 0  . Cholecalciferol (VITAMIN D3) 2000 UNITS TABS Take 2,000 Units by mouth at bedtime.     . clopidogrel (PLAVIX) 75 MG tablet Take 75 mg by mouth daily.    . Cyanocobalamin (VITAMIN B-12 PO) Take 1 tablet by mouth daily.    . diazepam (VALIUM) 5 MG tablet Take 2.5 mg by mouth at bedtime as needed for anxiety.     . gabapentin (NEURONTIN) 300 MG capsule Take 2 capsules (600 mg total) by mouth 2 (two) times daily. 120 capsule 3  . metoprolol (LOPRESSOR) 50 MG tablet Take 1 tablet (50 mg total) by mouth 2 (two) times daily. Take 73m by mouth in the morning and take 270mby mouth at bedtime. (Patient taking differently: Take 50 mg by mouth 2 (two) times daily. ) 60 tablet 0  . Multiple Vitamins-Minerals (EYE VITAMINS PO) Take 1 tablet by mouth  at bedtime.     . pantoprazole (PROTONIX) 40 MG tablet Take 1 tablet (40 mg total) by mouth daily. 30 tablet 1  . valsartan-hydrochlorothiazide (DIOVAN-HCT) 160-12.5 MG per tablet Take 0.5 tablets by mouth daily.    . Marland Kitchenoperamide (IMODIUM) 2 MG capsule Take 2 mg by mouth as needed for diarrhea or loose stools.      Physical Exam: General appearance: alert and no distress Lungs: rales bibasilar Heart: regular rate and rhythm Extremities: edema trace LEE edema and right TMA Neurologic: Grossly normal  Lab Results: Results for orders placed or performed during the hospital encounter of 12/11/16 (from the past 48 hour(s))  CBC     Status: Abnormal   Collection Time: 12/19/16 11:56 AM  Result Value Ref Range   WBC 12.5 (H) 4.0 - 10.5 K/uL   RBC 3.06 (L) 3.87 - 5.11 MIL/uL   Hemoglobin 8.9 (L) 12.0 - 15.0 g/dL   HCT 27.8 (L) 36.0 - 46.0 %   MCV 90.8 78.0 - 100.0 fL   MCH 29.1 26.0 - 34.0 pg   MCHC 32.0 30.0 - 36.0 g/dL   RDW 14.7 11.5 - 15.5 %   Platelets 369 150 - 400 K/uL  Basic metabolic panel     Status: Abnormal   Collection Time: 12/19/16 11:56 AM  Result Value Ref Range   Sodium 135 135 - 145 mmol/L   Potassium 3.7 3.5 - 5.1 mmol/L   Chloride 102 101 -  111 mmol/L   CO2 25 22 - 32 mmol/L   Glucose, Bld 148 (H) 65 - 99 mg/dL   BUN 11 6 - 20 mg/dL   Creatinine, Ser 0.96 0.44 - 1.00 mg/dL   Calcium 9.0 8.9 - 10.3 mg/dL   GFR calc non Af Amer 52 (L) >60 mL/min   GFR calc Af Amer >60 >60 mL/min    Comment: (NOTE) The eGFR has been calculated using the CKD EPI equation. This calculation has not been validated in all clinical situations. eGFR's persistently <60 mL/min signify possible Chronic Kidney Disease.    Anion gap 8 5 - 15  CBC     Status: Abnormal   Collection Time: 12/20/16  4:27 AM  Result Value Ref Range   WBC 11.5 (H) 4.0 - 10.5 K/uL   RBC 2.71 (L) 3.87 - 5.11 MIL/uL   Hemoglobin 8.0 (L) 12.0 - 15.0 g/dL   HCT 24.5 (L) 36.0 - 46.0 %   MCV 90.4 78.0 - 100.0 fL    MCH 29.5 26.0 - 34.0 pg   MCHC 32.7 30.0 - 36.0 g/dL   RDW 14.9 11.5 - 15.5 %   Platelets 349 150 - 400 K/uL  Basic metabolic panel     Status: Abnormal   Collection Time: 12/20/16  4:27 AM  Result Value Ref Range   Sodium 135 135 - 145 mmol/L   Potassium 3.7 3.5 - 5.1 mmol/L   Chloride 102 101 - 111 mmol/L   CO2 26 22 - 32 mmol/L   Glucose, Bld 112 (H) 65 - 99 mg/dL   BUN 12 6 - 20 mg/dL   Creatinine, Ser 0.91 0.44 - 1.00 mg/dL   Calcium 8.9 8.9 - 10.3 mg/dL   GFR calc non Af Amer 56 (L) >60 mL/min   GFR calc Af Amer >60 >60 mL/min    Comment: (NOTE) The eGFR has been calculated using the CKD EPI equation. This calculation has not been validated in all clinical situations. eGFR's persistently <60 mL/min signify possible Chronic Kidney Disease.    Anion gap 7 5 - 15    Imaging: Dg Chest Port 1 View  Result Date: 12/19/2016 CLINICAL DATA:  Shortness of breath. EXAM: PORTABLE CHEST 1 VIEW COMPARISON:  Radiographs 09/26/2016 FINDINGS: The heart is enlarged. There is atherosclerosis of the thoracic aorta. Hazy bibasilar opacities likely combination of pleural fluid and atelectasis. There is vascular congestion without overt edema. No pneumothorax. Right shoulder prosthesis is partially included. IMPRESSION: Bibasilar opacities likely combination of pleural fluid and atelectasis. Cardiomegaly and vascular congestion. Findings consistent with CHF. Electronically Signed   By: Jeb Levering M.D.   On: 12/19/2016 01:38    Assessment:  Principal Problem:   PAD (peripheral artery disease) (HCC) Active Problems:   Paroxysmal atrial fibrillation ([place])   Plan:  1. More in than out - likely secondary to excess IVF's. Will give additional IV lasix today. Fluids turned off. Maintaining sinus. Echo is reassuring with only mild LAE. On ASA/Plavix, not a NOAC candidate due to GIB.   Time Spent Directly with Patient:  15 minutes  Length of Stay:  LOS: 9 days   Pixie Casino, MD,  [facility] Attending Cardiologist [place] 12/20/2016, 11:04 AM

## 2016-12-20 NOTE — Progress Notes (Signed)
Met with [name] and her family this AM. She reports being "tired."  Continue current care.    Pain has been a major issue.  Plan trial of PCA.  F/u tomorrow for pain management.  Full consult to follow.  Please call with questions.  Micheline Rough, MD [place] Team [phone]

## 2016-12-20 NOTE — Progress Notes (Addendum)
[facility] Progress Note  Subjective    Having more pain right amputation site this am. Denies CP or SOB. Did ok yesterday with sitting up in chair.   Objective Vitals:   12/19/16 2102 12/20/16 0444  BP: (!) 132/52 (!) 167/61  Pulse: 87 87  Resp: 20 18  Temp: 99.1 F (37.3 C) 99.1 F (37.3 C)    Intake/Output Summary (Last 24 hours) at 12/20/16 0952 Last data filed at 12/20/16 0447  Gross per 24 hour  Intake           3944.5 ml  Output              950 ml  Net           2994.5 ml   Decreased breath sounds at bases, no rales, normal respiratory effort RRR Right transmetatarsal amputation site is purple distally.   Assessment/Planning: 83 y.o. female is s/p: right transmetatarsal amputation, exploration right transmetatarsal amputation  Palliative meeting today with family.  Right transmet site not healing, will need more proximal amputation. Will await results of palliative discussion.  Afib with RVR: NSR this am. Appreciate cardiology assistance. Acute CHF: appears comfortable this am, given  20 mg IV q 12 x 2 doses yesterday,diuresis per cardiology.  Alvia Grove 12/20/2016 9:52 AM --  Laboratory CBC    Component Value Date/Time   WBC 11.5 (H) 12/20/2016 0427   HGB 8.0 (L) 12/20/2016 0427   HCT 24.5 (L) 12/20/2016 0427   PLT 349 12/20/2016 0427    BMET    Component Value Date/Time   NA 135 12/20/2016 0427   K 3.7 12/20/2016 0427   CL 102 12/20/2016 0427   CO2 26 12/20/2016 0427   GLUCOSE 112 (H) 12/20/2016 0427   BUN 12 12/20/2016 0427   CREATININE 0.91 12/20/2016 0427   CALCIUM 8.9 12/20/2016 0427   GFRNONAA 56 (L) 12/20/2016 0427   GFRAA >60 12/20/2016 0427    COAG Lab Results  Component Value Date   INR 1.00 10/23/2016   INR 1.01 09/24/2016   INR 1.01 09/02/2013   No results found for: PTT  Antibiotics Anti-infectives    Start     Dose/Rate Route Frequency Ordered Stop   12/19/16 1000  ceFEPIme (MAXIPIME) 1 g in  dextrose 5 % 50 mL IVPB     1 g 100 mL/hr over 30 Minutes Intravenous Every 24 hours 12/19/16 0858     12/16/16 2000  cefUROXime (ZINACEF) 750 mg in dextrose 5 % 50 mL IVPB  Status:  Discontinued     750 mg 100 mL/hr over 30 Minutes Intravenous Every 8 hours 12/16/16 1855 12/19/16 0857   12/14/16 0800  cephALEXin (KEFLEX) capsule 250 mg  Status:  Discontinued     250 mg Oral Every 8 hours 12/14/16 0717 12/14/16 0722   12/14/16 0800  cephALEXin (KEFLEX) capsule 500 mg  Status:  Discontinued     500 mg Oral Every 8 hours 12/14/16 0722 12/16/16 1913   12/11/16 2000  cefUROXime (ZINACEF) 1.5 g in dextrose 5 % 50 mL IVPB     1.5 g 100 mL/hr over 30 Minutes Intravenous Every 12 hours 12/11/16 1217 12/12/16 0830   12/11/16 0728  cefUROXime (ZINACEF) 1.5 g in dextrose 5 % 50 mL IVPB     1.5 g 100 mL/hr over 30 Minutes Intravenous 30 min pre-op 12/11/16 0728 12/11/16 0935       [name], PA-C [facility] Office: [phone] Pager:  [phone] 12/20/2016 9:52 AM   I have examined the patient, reviewed and agree with above.Discussed with patient and family present. Appreciate palliative care team for pain management. Discussed potential need for further more proximal amputation  Curt Jews, MD 12/20/2016 11:50 AM

## 2016-12-21 DIAGNOSIS — M79604 Pain in right leg: Secondary | ICD-10-CM

## 2016-12-21 DIAGNOSIS — Z515 Encounter for palliative care: Secondary | ICD-10-CM

## 2016-12-21 DIAGNOSIS — I5031 Acute diastolic (congestive) heart failure: Secondary | ICD-10-CM

## 2016-12-21 LAB — CBC
HEMATOCRIT: 24.2 % — AB (ref 36.0–46.0)
HEMOGLOBIN: 7.8 g/dL — AB (ref 12.0–15.0)
MCH: 29.1 pg (ref 26.0–34.0)
MCHC: 32.2 g/dL (ref 30.0–36.0)
MCV: 90.3 fL (ref 78.0–100.0)
Platelets: 418 10*3/uL — ABNORMAL HIGH (ref 150–400)
RBC: 2.68 MIL/uL — AB (ref 3.87–5.11)
RDW: 14.8 % (ref 11.5–15.5)
WBC: 12.7 10*3/uL — AB (ref 4.0–10.5)

## 2016-12-21 LAB — BASIC METABOLIC PANEL
ANION GAP: 8 (ref 5–15)
BUN: 13 mg/dL (ref 6–20)
CHLORIDE: 98 mmol/L — AB (ref 101–111)
CO2: 27 mmol/L (ref 22–32)
CREATININE: 0.9 mg/dL (ref 0.44–1.00)
Calcium: 8.9 mg/dL (ref 8.9–10.3)
GFR calc non Af Amer: 56 mL/min — ABNORMAL LOW (ref >60)
Glucose, Bld: 100 mg/dL — ABNORMAL HIGH (ref 65–99)
Potassium: 3.2 mmol/L — ABNORMAL LOW (ref 3.5–5.1)
SODIUM: 133 mmol/L — AB (ref 135–145)

## 2016-12-21 MED ORDER — FUROSEMIDE 10 MG/ML IJ SOLN
20.0000 mg | Freq: Once | INTRAMUSCULAR | Status: AC
  Administered 2016-12-21: 20 mg via INTRAVENOUS
  Filled 2016-12-21: qty 2

## 2016-12-21 MED ORDER — POTASSIUM CHLORIDE CRYS ER 20 MEQ PO TBCR
40.0000 meq | EXTENDED_RELEASE_TABLET | Freq: Once | ORAL | Status: AC
  Administered 2016-12-21: 40 meq via ORAL
  Filled 2016-12-21: qty 2

## 2016-12-21 MED ORDER — BISACODYL 10 MG RE SUPP
10.0000 mg | Freq: Once | RECTAL | Status: AC
  Administered 2016-12-21: 10 mg via RECTAL
  Filled 2016-12-21: qty 1

## 2016-12-21 MED ORDER — POTASSIUM CHLORIDE CRYS ER 20 MEQ PO TBCR: 40.0000 meq | EXTENDED_RELEASE_TABLET | Freq: Every day | ORAL | Status: DC | PRN

## 2016-12-21 NOTE — Consult Note (Signed)
Consultation Note Date: 12/21/2016   Patient Name: [name]  DOB: [birth year]  MRN: [id number]  Age / Sex: 83 y.o., female  PCP: Josetta Huddle, MD Referring Physician: Serafina Mitchell, MD  Reason for Consultation: Establishing goals of care  HPI/Patient Profile: 83 y.o. female  with past medical history of PAD/PVD status post multiple procedures, HTN, GI bleed (on Eliquis), atrial fibrillation admitted on 12/11/2016 for transmetatarsal amputation. Palliative consulted for goals of care and pain management.  Clinical Assessment and Goals of Care: Met today with [name], her 3 children, and several other family members.  They report the most important thing to her is her family. She also values her independence and being able to be in her own home.  Family reports that the doctors have been doing a good job explaining things to them, and they understand her current clinical situation.  [name] reports she is frustrated that she does not feel as well as she was hoping to after having TMA.  When I asked her thoughts on this surgery as well as possibility of moving forward with further procedures if recommended, she reports that she is "tired."  I asked her to further clarify this, and she reports that she means she is "tired of it all.  I see myself just getting worse."  She reports that she values Dr. Stephens Shire opinion greatly, and she wants to talk the situation over further with him before making any other decisions about care plan moving forward.  We discussed how her current pain is impacting her, and agreed that we need to work to aggressively try to manage her pain as best as possible. Discussed options for this including trial of PCA. Family was agreeable to this.  We also discussed limitations on care provided while she is here in the hospital. She has advance directive and is clear in her desire  not to have aggressive measures in the event of cardiac arrest/respiratory arrest. She is agreeable to intubation if needed for surgical procedures, however, she would not want to be intubated in the event of emergent respiratory arrest.   SUMMARY OF RECOMMENDATIONS   - Updated CODE STATUS to partial code. She is agreeable medications and trial of BiPAP. She does not want chest compressions, defibrillation, or intubation in the event of respiratory arrest. - We had a long discussion regarding her clinical course to this point in time. 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 The patient became tearful during our conversation and reports that she is "tired."  This was upsetting to her children to hear. - We discussed her current pain and how this is impacting her thoughts on her overall situation.  We did discuss the risk versus benefit of increased opioid dosing, and she and her family agree that her being out of pain is the most important thing at this time. I think she would best be served by a PCA in order to better determine her needs overall to be  comfortable. I discussed this with the vascular team and will plan for initiation of a trial of PCA utilizing Dilaudid. - We'll plan to follow-up tomorrow for pain management and again next week after she has the opportunity to discuss with [name].  Code [place]:  Limited code   We discussed that in light of multiple chronic medical problems that have worsened with this acute problem, care should be focused on interventions that are likely to allow the patient to achieve goal of getting back to home and spending time with family. I discussed with family regarding heroic interventions at the end-of-life and they agree this would not be in line with prior expressed wishes for a natural death or be likely to lead to getting well enough to go back home.   She does not want chest  compressions, defibrilation, or intubation in the event of respiratory arrest. She would be agreeable to elective intubation if needed for another surgery and she decides to proceed with that surgery. She would be agreeable to a trial of BiPAP as well as any cardiac medications that will be recommended.  Updated to partial code to reflect her wishes.  Symptom Management:  Pain: Reports this is her main concern today. She has been getting better relief with IV Dilaudid that she has been with oral route. It appears that she needs to be evaluated for possibility of more proximal amputation.  We'll therefore plan for trial of PCA to see if we can get her pain under better control and better determine her needs to ensure that she is comfortable. Her family is on agreement that her being comfortable is one of the main focus is moving forward.  Palliative Prophylaxis:   Bowel Regimen and Frequent Pain Assessment  Psycho-social/Spiritual:   Desire for further Chaplaincy support:Not addressed today  Prognosis:   Unable to determine  Discharge Planning: To Be Determined      Primary Diagnoses: Present on Admission: . PAD (peripheral artery disease) ([place]) . Paroxysmal atrial fibrillation (HCC)   I have reviewed the medical record, interviewed the patient and family, and examined the patient. The following aspects are pertinent.  Past Medical History:  Diagnosis Date  . Anemia   . Anginal pain ([place])   . Arthritis    "qwhere" (09/05/2016)  . Atrial fibrillation ([place]) 09/2016  . Chronic lower back pain   . Complication of anesthesia    "takes a long time for it to wear off; I can hallucinate if I take too much" (09/05/2016)  . DVT (deep venous thrombosis) ([place]) 10/2009  . Fall from steps 08/31/2013   Fx. pelvis, Left Hip, Left Elbow  . Fibromyalgia   . GERD (gastroesophageal reflux disease)    09/22/16- "no too much anymore"  . GI bleed 10/24/2016  . High cholesterol   . History of  blood transfusion   . History of hiatal hernia   . Hypertension   . Macular degeneration, wet ([place])    "started in right eye; now legally blind in that eye; now started in left eye but pretty much in control" (09/05/2016)  . Osteoporosis   . Peripheral vascular disease ([place])    nonviable tissue Right foot  . PONV (postoperative nausea and vomiting)   . Squamous cell carcinoma of skin of right calf Aug. 2015  . Stroke ([employer])    TIA's no residual  . TIA (transient ischemic attack)    "several at once; none in a long time" (09/05/2016)  Social History   Social History  . Marital status: Married    Spouse name: N/A  . Number of children: N/A  . Years of education: N/A   Social History Main Topics  . Smoking status: Former Smoker    Types: Cigarettes    Quit date: 11/17/1946  . Smokeless tobacco: Never Used     Comment: "never smoked much"  . Alcohol use No  . Drug use: No  . Sexual activity: Not Asked   Other Topics Concern  . None   Social History Narrative  . None   Family History  Problem Relation Age of Onset  . Heart disease Father     Heart Disease before age 12  . Hyperlipidemia Father   . Hypertension Father   . Alcohol abuse Father   . Heart disease Brother   . Hyperlipidemia Brother   . Hypertension Brother   . Deep vein thrombosis Brother   . Heart disease Son     Heart Disease before age 91  . Hyperlipidemia Son   . Hypertension Son   . Heart attack Son   . Diabetes Son   . Hypertension Son   . Hyperlipidemia Sister   . Hypertension Sister    Scheduled Meds: . ceFEPime (MAXIPIME) IV  1 g Intravenous Q24H  . clopidogrel  75 mg Oral Daily  . enoxaparin (LOVENOX) injection  30 mg Subcutaneous Q24H  . feeding supplement (ENSURE ENLIVE)  237 mL Oral BID BM  . gabapentin  600 mg Oral TID  . hydrochlorothiazide  6.25 mg Oral Daily  . HYDROmorphone   Intravenous Q4H  . irbesartan  75 mg Oral Daily  . metoprolol  50 mg Oral Daily  . metoprolol  25 mg  Oral QHS  . multivitamin with minerals  1 tablet Oral Daily  . pantoprazole  40 mg Oral Daily  . senna  1 tablet Oral BID   Continuous Infusions: . sodium chloride 10 mL/hr at 12/19/16 1049  . lactated ringers 50 mL/hr at 12/17/16 1032   PRN Meds:.acetaminophen, alum & mag hydroxide-simeth, diazepam, diphenhydrAMINE **OR** diphenhydrAMINE, guaiFENesin-dextromethorphan, hydrALAZINE, HYDROmorphone (DILAUDID) injection, labetalol, loperamide, magnesium sulfate 1 - 4 g bolus IVPB, metoprolol, naloxone **AND** sodium chloride flush, ondansetron, phenol, silver nitrate applicators Medications Prior to Admission:  Prior to Admission medications   Medication Sig Start Date End Date Taking? Authorizing Provider  acetaminophen (TYLENOL) 500 MG tablet Take 1,000 mg by mouth 2 (two) times daily as needed for moderate pain or headache. Patient took this medication for her pain.   Yes Historical Provider, MD  cephALEXin (KEFLEX) 500 MG capsule Take 1 capsule (500 mg total) by mouth 3 (three) times daily. 12/01/16  Yes Serafina Mitchell, MD  Cholecalciferol (VITAMIN D3) 2000 UNITS TABS Take 2,000 Units by mouth at bedtime.    Yes Historical Provider, MD  clopidogrel (PLAVIX) 75 MG tablet Take 75 mg by mouth daily.   Yes Historical Provider, MD  Cyanocobalamin (VITAMIN B-12 PO) Take 1 tablet by mouth daily.   Yes Historical Provider, MD  diazepam (VALIUM) 5 MG tablet Take 2.5 mg by mouth at bedtime as needed for anxiety.    Yes Historical Provider, MD  gabapentin (NEURONTIN) 300 MG capsule Take 2 capsules (600 mg total) by mouth 2 (two) times daily. 10/03/16  Yes Alvia Grove, PA-C  HYDROcodone-acetaminophen (NORCO/VICODIN) 5-325 MG tablet Take 1-2 tablets by mouth every 4 (four) hours as needed for moderate pain. 12/08/16  Yes Serafina Mitchell, MD  metoprolol (LOPRESSOR) 50 MG tablet Take 1 tablet (50 mg total) by mouth 2 (two) times daily. Take 46m by mouth in the morning and take 232mby mouth at  bedtime. Patient taking differently: Take 50 mg by mouth 2 (two) times daily.  10/26/16  Yes DaOrson EvaMD  Multiple Vitamins-Minerals (EYE VITAMINS PO) Take 1 tablet by mouth at bedtime.    Yes Historical Provider, MD  pantoprazole (PROTONIX) 40 MG tablet Take 1 tablet (40 mg total) by mouth daily. 10/25/16  Yes DaOrson EvaMD  valsartan-hydrochlorothiazide (DIOVAN-HCT) 160-12.5 MG per tablet Take 0.5 tablets by mouth daily.   Yes Historical Provider, MD  loperamide (IMODIUM) 2 MG capsule Take 2 mg by mouth as needed for diarrhea or loose stools.    Historical Provider, MD   Allergies  Allergen Reactions  . Motrin [Ibuprofen] Other (See Comments)    ADVERSE REACTION - GI BLEED  . Statins Other (See Comments)    ADVERSE REACTION MUSCLE PAIN & WEAKNESS  . Morphine And Related Other (See Comments)    HALLUCINATIONS REACTION IS SIDE EFFECT  . Oxycontin [Oxycodone Hcl] Other (See Comments)    [REACTION IS SIDE EFFECT]  Hallinatations  . Promethazine Hcl Other (See Comments)    IV  Drug only, makes her act crazy  . Sulfa Antibiotics Nausea And Vomiting   Review of Systems  Foot pain, otherwise  Physical Exam  General: Alert, awake, in no acute distress.  Heart: Regular rate and rhythm. No murmur appreciated. Lungs: Fair air movement, scattered rales in bases Abdomen: Soft, nontender, nondistended, positive bowel sounds.  Ext: R TMA Skin: Warm and dry Neuro: Grossly intact, nonfocal.   Vital Signs: BP (!) 150/82 (BP Location: Right Arm)   Pulse 81   Temp 99 F (37.2 C) (Oral)   Resp 20   Ht _0  (1.575 m)   Wt 57.7 kg (127 lb 1.6 oz)   SpO2 94%   BMI 23.25 kg/m  Pain Assessment: No/denies pain POSS *See Group Information*: 1-Acceptable,Awake and alert Pain Score: 4    SpO2: SpO2: 94 % O2 Device:SpO2: 94 % O2 Flow Rate: .O2 Flow Rate (L/min): 2 L/min  IO: Intake/output summary:  Intake/Output Summary (Last 24 hours) at 12/21/16 1343 Last data filed at 12/21/16  1115  Gross per 24 hour  Intake                0 ml  Output             1375 ml  Net            -1375 ml    LBM: Last BM Date: 12/21/16 Baseline Weight: Weight: 58.2 kg (128 lb 4.9 oz) Most recent weight: Weight: 57.7 kg (127 lb 1.6 oz)     Palliative Assessment/Data:   Flowsheet Rows   Flowsheet Row Most Recent Value  Intake Tab  Referral Department  Surgery  Unit at Time of Referral  Med/Surg Unit  Palliative Care Primary Diagnosis  Other (Comment) [Peripheral vascular]  Date Notified  12/19/16  Palliative Care Type  New Palliative care  Reason for referral  Pain, Clarify Goals of Care  Date of Admission  12/11/16  Date first seen by Palliative Care  12/20/16  # of days Palliative referral response time  1 Day(s)  # of days IP prior to Palliative referral  8  Clinical Assessment  Palliative Performance Scale Score  50%  Pain Max last 24 hours  10  Pain Min Last  24 hours  2  Psychosocial & Spiritual Assessment  Palliative Care Outcomes  Patient/Family meeting held?  Yes  Who was at the meeting?  Patient, 3 children     Time Total: 80 Greater than 50%  of this time was spent counseling and coordinating care related to the above assessment and plan.  Signed by: Micheline Rough, MD   Please contact Palliative Medicine Team phone at [phone] for questions and concerns.  For individual provider: See Shea Evans

## 2016-12-21 NOTE — Progress Notes (Signed)
Subjective: Interval History: none.. Feeling much better. Pain better controlled. Joking with the family members present. Asking to change from heart healthy to regular diet   Objective: Vital signs in last 24 hours: Temp:  [98.6 F (37 C)-99.5 F (37.5 C)] 99.1 F (37.3 C) (02/04 0755) Pulse Rate:  [82-94] 92 (02/04 0755) Resp:  [17-18] 18 (02/04 0755) BP: (138-165)/(58-66) 152/63 (02/04 0755) SpO2:  [93 %-100 %] 93 % (02/04 0755)  Intake/Output from previous day: 02/03 0701 - 02/04 0700 In: -  Out: 1425 [Urine:1425] Intake/Output this shift: Total I/O In: -  Out: 700 [Urine:700]  Leg warm. Dressing intact. It did not remove dressing.  Lab Results:  Recent Labs  12/20/16 0427 12/21/16 0125  WBC 11.5* 12.7*  HGB 8.0* 7.8*  HCT 24.5* 24.2*  PLT 349 418*   BMET  Recent Labs  12/20/16 0427 12/21/16 0125  NA 135 133*  K 3.7 3.2*  CL 102 98*  CO2 26 27  GLUCOSE 112* 100*  BUN 12 13  CREATININE 0.91 0.90  CALCIUM 8.9 8.9    Studies/Results: Dg Chest Port 1 View  Result Date: 12/19/2016 CLINICAL DATA:  Shortness of breath. EXAM: PORTABLE CHEST 1 VIEW COMPARISON:  Radiographs 09/26/2016 FINDINGS: The heart is enlarged. There is atherosclerosis of the thoracic aorta. Hazy bibasilar opacities likely combination of pleural fluid and atelectasis. There is vascular congestion without overt edema. No pneumothorax. Right shoulder prosthesis is partially included. IMPRESSION: Bibasilar opacities likely combination of pleural fluid and atelectasis. Cardiomegaly and vascular congestion. Findings consistent with CHF. Electronically Signed   By: Jeb Levering M.D.   On: 12/19/2016 01:38   Anti-infectives: Anti-infectives    Start     Dose/Rate Route Frequency Ordered Stop   12/19/16 1000  ceFEPIme (MAXIPIME) 1 g in dextrose 5 % 50 mL IVPB     1 g 100 mL/hr over 30 Minutes Intravenous Every 24 hours 12/19/16 0858     12/16/16 2000  cefUROXime (ZINACEF) 750 mg in dextrose 5  % 50 mL IVPB  Status:  Discontinued     750 mg 100 mL/hr over 30 Minutes Intravenous Every 8 hours 12/16/16 1855 12/19/16 0857   12/14/16 0800  cephALEXin (KEFLEX) capsule 250 mg  Status:  Discontinued     250 mg Oral Every 8 hours 12/14/16 0717 12/14/16 0722   12/14/16 0800  cephALEXin (KEFLEX) capsule 500 mg  Status:  Discontinued     500 mg Oral Every 8 hours 12/14/16 0722 12/16/16 1913   12/11/16 2000  cefUROXime (ZINACEF) 1.5 g in dextrose 5 % 50 mL IVPB     1.5 g 100 mL/hr over 30 Minutes Intravenous Every 12 hours 12/11/16 1217 12/12/16 0830   12/11/16 0728  cefUROXime (ZINACEF) 1.5 g in dextrose 5 % 50 mL IVPB     1.5 g 100 mL/hr over 30 Minutes Intravenous 30 min pre-op 12/11/16 [id number] 12/11/16 0935      Assessment/Plan: s/p Procedure(s): IRRIGATION AND DEBRIDEMENT RIGHT FOOT (Right) Will change to regular diet. Pain control much better. [name] we'll discuss plan for transmetatarsal amputation on Monday   LOS: 10 days   [name] 12/21/2016, 11:56 AM

## 2016-12-21 NOTE — Progress Notes (Signed)
DAILY PROGRESS NOTE  Subjective:  Diuresed yesterday another 1.5L. Still 4L positive. Mild basilar crackles. Potassium low today.  Objective:  Temp:  [98.6 F (37 C)-99.5 F (37.5 C)] 99.1 F (37.3 C) (02/04 0755) Pulse Rate:  [82-94] 92 (02/04 0755) Resp:  [17-18] 18 (02/04 0755) BP: (138-165)/(58-66) 152/63 (02/04 0755) SpO2:  [93 %-100 %] 93 % (02/04 0755) Weight change:   Intake/Output from previous day: 02/03 0701 - 02/04 0700 In: -  Out: 1425 [Urine:1425]  Intake/Output from this shift: No intake/output data recorded.  Medications: No current facility-administered medications on file prior to encounter.    Current Outpatient Prescriptions on File Prior to Encounter  Medication Sig Dispense Refill  . acetaminophen (TYLENOL) 500 MG tablet Take 1,000 mg by mouth 2 (two) times daily as needed for moderate pain or headache. Patient took this medication for her pain.    . cephALEXin (KEFLEX) 500 MG capsule Take 1 capsule (500 mg total) by mouth 3 (three) times daily. 30 capsule 0  . Cholecalciferol (VITAMIN D3) 2000 UNITS TABS Take 2,000 Units by mouth at bedtime.     . clopidogrel (PLAVIX) 75 MG tablet Take 75 mg by mouth daily.    . Cyanocobalamin (VITAMIN B-12 PO) Take 1 tablet by mouth daily.    . diazepam (VALIUM) 5 MG tablet Take 2.5 mg by mouth at bedtime as needed for anxiety.     . gabapentin (NEURONTIN) 300 MG capsule Take 2 capsules (600 mg total) by mouth 2 (two) times daily. 120 capsule 3  . metoprolol (LOPRESSOR) 50 MG tablet Take 1 tablet (50 mg total) by mouth 2 (two) times daily. Take 42m by mouth in the morning and take 275mby mouth at bedtime. (Patient taking differently: Take 50 mg by mouth 2 (two) times daily. ) 60 tablet 0  . Multiple Vitamins-Minerals (EYE VITAMINS PO) Take 1 tablet by mouth at bedtime.     . pantoprazole (PROTONIX) 40 MG tablet Take 1 tablet (40 mg total) by mouth daily. 30 tablet 1  . valsartan-hydrochlorothiazide (DIOVAN-HCT)  160-12.5 MG per tablet Take 0.5 tablets by mouth daily.    . Marland Kitchenoperamide (IMODIUM) 2 MG capsule Take 2 mg by mouth as needed for diarrhea or loose stools.      Physical Exam: General appearance: alert and no distress Lungs: rales bibasilar and LLL Heart: regular rate and rhythm Extremities: edema trace LEE edema and right TMA Neurologic: Grossly normal  Lab Results: Results for orders placed or performed during the hospital encounter of 12/11/16 (from the past 48 hour(s))  CBC     Status: Abnormal   Collection Time: 12/19/16 11:56 AM  Result Value Ref Range   WBC 12.5 (H) 4.0 - 10.5 K/uL   RBC 3.06 (L) 3.87 - 5.11 MIL/uL   Hemoglobin 8.9 (L) 12.0 - 15.0 g/dL   HCT 27.8 (L) 36.0 - 46.0 %   MCV 90.8 78.0 - 100.0 fL   MCH 29.1 26.0 - 34.0 pg   MCHC 32.0 30.0 - 36.0 g/dL   RDW 14.7 11.5 - 15.5 %   Platelets 369 150 - 400 K/uL  Basic metabolic panel     Status: Abnormal   Collection Time: 12/19/16 11:56 AM  Result Value Ref Range   Sodium 135 135 - 145 mmol/L   Potassium 3.7 3.5 - 5.1 mmol/L   Chloride 102 101 - 111 mmol/L   CO2 25 22 - 32 mmol/L   Glucose, Bld 148 (H) 65 - 99 mg/dL  BUN 11 6 - 20 mg/dL   Creatinine, Ser 0.96 0.44 - 1.00 mg/dL   Calcium 9.0 8.9 - 10.3 mg/dL   GFR calc non Af Amer 52 (L) >60 mL/min   GFR calc Af Amer >60 >60 mL/min    Comment: (NOTE) The eGFR has been calculated using the CKD EPI equation. This calculation has not been validated in all clinical situations. eGFR's persistently <60 mL/min signify possible Chronic Kidney Disease.    Anion gap 8 5 - 15  CBC     Status: Abnormal   Collection Time: 12/20/16  4:27 AM  Result Value Ref Range   WBC 11.5 (H) 4.0 - 10.5 K/uL   RBC 2.71 (L) 3.87 - 5.11 MIL/uL   Hemoglobin 8.0 (L) 12.0 - 15.0 g/dL   HCT 24.5 (L) 36.0 - 46.0 %   MCV 90.4 78.0 - 100.0 fL   MCH 29.5 26.0 - 34.0 pg   MCHC 32.7 30.0 - 36.0 g/dL   RDW 14.9 11.5 - 15.5 %   Platelets 349 150 - 400 K/uL  Basic metabolic panel     Status:  Abnormal   Collection Time: 12/20/16  4:27 AM  Result Value Ref Range   Sodium 135 135 - 145 mmol/L   Potassium 3.7 3.5 - 5.1 mmol/L   Chloride 102 101 - 111 mmol/L   CO2 26 22 - 32 mmol/L   Glucose, Bld 112 (H) 65 - 99 mg/dL   BUN 12 6 - 20 mg/dL   Creatinine, Ser 0.91 0.44 - 1.00 mg/dL   Calcium 8.9 8.9 - 10.3 mg/dL   GFR calc non Af Amer 56 (L) >60 mL/min   GFR calc Af Amer >60 >60 mL/min    Comment: (NOTE) The eGFR has been calculated using the CKD EPI equation. This calculation has not been validated in all clinical situations. eGFR's persistently <60 mL/min signify possible Chronic Kidney Disease.    Anion gap 7 5 - 15  CBC     Status: Abnormal   Collection Time: 12/21/16  1:25 AM  Result Value Ref Range   WBC 12.7 (H) 4.0 - 10.5 K/uL   RBC 2.68 (L) 3.87 - 5.11 MIL/uL   Hemoglobin 7.8 (L) 12.0 - 15.0 g/dL   HCT 24.2 (L) 36.0 - 46.0 %   MCV 90.3 78.0 - 100.0 fL   MCH 29.1 26.0 - 34.0 pg   MCHC 32.2 30.0 - 36.0 g/dL   RDW 14.8 11.5 - 15.5 %   Platelets 418 (H) 150 - 400 K/uL  Basic metabolic panel     Status: Abnormal   Collection Time: 12/21/16  1:25 AM  Result Value Ref Range   Sodium 133 (L) 135 - 145 mmol/L   Potassium 3.2 (L) 3.5 - 5.1 mmol/L   Chloride 98 (L) 101 - 111 mmol/L   CO2 27 22 - 32 mmol/L   Glucose, Bld 100 (H) 65 - 99 mg/dL   BUN 13 6 - 20 mg/dL   Creatinine, Ser 0.90 0.44 - 1.00 mg/dL   Calcium 8.9 8.9 - 10.3 mg/dL   GFR calc non Af Amer 56 (L) >60 mL/min   GFR calc Af Amer >60 >60 mL/min    Comment: (NOTE) The eGFR has been calculated using the CKD EPI equation. This calculation has not been validated in all clinical situations. eGFR's persistently <60 mL/min signify possible Chronic Kidney Disease.    Anion gap 8 5 - 15    Imaging: No results found.  Assessment:  Principal Problem:   PAD (peripheral artery disease) (HCC) Active Problems:   Paroxysmal atrial fibrillation ([place])   Plan:  1. Good diuresis - breathing improved.  Give additional IV lasix today. Replete potassum 40 MEQ today. Check BMET in am tomorrow. Pain control is adequate.  Time Spent Directly with Patient:  15 minutes  Length of Stay:  LOS: 10 days   Pixie Casino, MD, [facility] Attending Cardiologist [place] 12/21/2016, 10:29 AM

## 2016-12-21 NOTE — Clinical Social Work Placement (Signed)
   CLINICAL SOCIAL WORK PLACEMENT  NOTE  Date:  12/21/2016  Patient Details  Name: [name] MRN: [id number] Date of Birth: 06/19/1928  Clinical Social Work is seeking post-discharge placement for this patient at the [place] level of care (*CSW will initial, date and re-position this form in  chart as items are completed):  Yes   Patient/family provided with [facility] Work Department's list of facilities offering this level of care within the geographic area requested by the patient (or if unable, by the patient's family).  Yes   Patient/family informed of their freedom to choose among providers that offer the needed level of care, that participate in Medicare, Medicaid or managed care program needed by the patient, have an available bed and are willing to accept the patient.  Yes   Patient/family informed of [facility]'s ownership interest in [facility] and [facility], as well as of the fact that they are under no obligation to receive care at these facilities.  PASRR submitted to EDS on 09/05/13     PASRR number received on 09/05/13     Existing PASRR number confirmed on 12/21/16     FL2 transmitted to all facilities in geographic area requested by pt/family on       FL2 transmitted to all facilities within larger geographic area on       Patient informed that his/her managed care company has contracts with or will negotiate with certain facilities, including the following:            Patient/family informed of bed offers received.  Patient chooses bed at       Physician recommends and patient chooses bed at      Patient to be transferred to   on  .  Patient to be transferred to facility by       Patient family notified on   of transfer.  Name of family member notified:        PHYSICIAN       Additional Comment:    _______________________________________________ Serafina Mitchell, LCSWA 12/21/2016, 1:42 PM

## 2016-12-21 NOTE — NC FL2 (Signed)
[place] LEVEL OF CARE SCREENING TOOL     IDENTIFICATION  Patient Name: [name] Birthdate: 04/09/1928 Sex: female Admission Date (Current Location): 12/11/2016  [facility], [facility], [facility] and [place] Number:  Herbalist and Address:  [facility] [place] [place] [address], [place], [place] [address]      Provider Number: [id number]  Attending Physician Name and Address:  Serafina Mitchell, MD  Relative Name and Phone Number:  [place], [place] [phone]    Current Level of Care: Hospital Recommended Level of Care: [place] Prior Approval Number:    Date Approved/Denied:   PASRR Number: [id number]  Discharge Plan: SNF    Current Diagnoses: Patient Active Problem List   Diagnosis Date Noted  . Acute GI bleeding 10/23/2016  . Paroxysmal atrial fibrillation ([place]) 10/03/2016  . 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 PAD (peripheral artery disease) ([place]) 09/05/2016  . Groin pain 04/02/2015  . Aftercare following surgery of the circulatory system, [place] 12/13/2013  . Shingles 10/26/2013  . Anxiety 10/26/2013  . Acute blood loss anemia 09/05/2013  . Elbow fracture, left 09/05/2013  . Left acetabular fracture ([place]) 09/05/2013  . Ankle fracture, left 09/05/2013  . HTN (hypertension) 09/03/2013  . HLD (hyperlipidemia) 09/03/2013  . Peripheral vascular disease, unspecified 05/10/2012  . Chronic total occlusion of artery of the extremities ([place]) 01/19/2012    Orientation RESPIRATION BLADDER Height & Weight     Self, Time, Situation, Place  Normal Continent Weight: 127 lb 1.6 oz (57.7 kg) Height:  5\' 2"  (157.5 cm)  BEHAVIORAL SYMPTOMS/MOOD NEUROLOGICAL BOWEL NUTRITION STATUS      Continent Diet (See DC Summary)  AMBULATORY STATUS COMMUNICATION OF NEEDS Skin   Limited Assist Verbally Normal                       Personal Care Assistance Level of  Assistance  Bathing, Dressing Bathing Assistance: Limited assistance   Dressing Assistance: Limited assistance     Functional Limitations Info  Hearing, Sight, Speech Sight Info: Adequate Hearing Info: Adequate Speech Info: Adequate    SPECIAL CARE FACTORS FREQUENCY  PT (By licensed PT), OT (By licensed OT)     PT Frequency: 5x wk OT Frequency: 5x wk            Contractures Contractures Info: Not present    Additional Factors Info  Code Status Code Status Info: Partial Code             Current Medications (12/21/2016):  This is the current hospital active medication list Current Facility-Administered Medications  Medication Dose Route Frequency Provider Last Rate Last Dose  . 0.9 %  sodium chloride infusion   Intravenous Continuous Waynetta Sandy, MD 10 mL/hr at 12/19/16 1049    . acetaminophen (TYLENOL) tablet 1,000 mg  1,000 mg Oral BID PRN Ulyses Amor, PA-C   1,000 mg at 12/19/16 0123  . alum & mag hydroxide-simeth (MAALOX/MYLANTA) 200-200-20 MG/5ML suspension 15-30 mL  15-30 mL Oral Q2H PRN Ulyses Amor, PA-C      . ceFEPIme (MAXIPIME) 1 g in dextrose 5 % 50 mL IVPB  1 g Intravenous Q24H Ulyses Amor, PA-C   1 g at 12/21/16 [id number]  . clopidogrel (PLAVIX) tablet 75 mg  75 mg Oral Daily Ulyses Amor, PA-C   75 mg at 12/21/16 0957  . diazepam (VALIUM) tablet 2.5  mg  2.5 mg Oral QHS PRN Ulyses Amor, PA-C   2.5 mg at 12/14/16 2351  . diphenhydrAMINE (BENADRYL) injection 12.5 mg  12.5 mg Intravenous Q6H PRN Micheline Rough, MD       Or  . diphenhydrAMINE (BENADRYL) 12.5 MG/5ML elixir 12.5 mg  12.5 mg Oral Q6H PRN Gene Domingo Cocking, MD      . enoxaparin (LOVENOX) injection 30 mg  30 mg Subcutaneous Q24H Ulyses Amor, PA-C   30 mg at 12/20/16 1331  . feeding supplement (ENSURE ENLIVE) (ENSURE ENLIVE) liquid 237 mL  237 mL Oral BID BM Serafina Mitchell, MD   237 mL at 12/20/16 1051  . gabapentin (NEURONTIN) capsule 600 mg  600 mg Oral TID Ulyses Amor, PA-C    600 mg at 12/21/16 [id number]  . guaiFENesin-dextromethorphan (ROBITUSSIN DM) 100-10 MG/5ML syrup 15 mL  15 mL Oral Q4H PRN Ulyses Amor, PA-C      . hydrALAZINE (APRESOLINE) injection 5 mg  5 mg Intravenous Q20 Min PRN Ulyses Amor, PA-C      . hydrochlorothiazide 10 mg/mL oral suspension 6.25 mg  6.25 mg Oral Daily Ulyses Amor, PA-C   6.25 mg at 12/21/16 0957  . HYDROmorphone (DILAUDID) 1 mg/mL PCA injection   Intravenous Q4H Gene Freeman, MD   25 mg at 12/20/16 1309  . HYDROmorphone (DILAUDID) injection 0.5 mg  0.5 mg Intravenous Q2H PRN Micheline Rough, MD   0.5 mg at 12/21/16 0853  . irbesartan (AVAPRO) tablet 75 mg  75 mg Oral Daily Skeet Simmer, RPH   75 mg at 12/21/16 0957  . labetalol (NORMODYNE,TRANDATE) injection 10 mg  10 mg Intravenous Q10 min PRN Ulyses Amor, PA-C   10 mg at 12/19/16 0056  . lactated ringers infusion   Intravenous Continuous Roberts Gaudy, MD 50 mL/hr at 12/17/16 1032    . loperamide (IMODIUM) capsule 2 mg  2 mg Oral PRN Ulyses Amor, PA-C      . magnesium sulfate IVPB 2 g 50 mL  2 g Intravenous Daily PRN Ulyses Amor, PA-C      . metoprolol (LOPRESSOR) injection 2-5 mg  2-5 mg Intravenous Q2H PRN Ulyses Amor, PA-C      . metoprolol (LOPRESSOR) tablet 50 mg  50 mg Oral Daily Ulyses Amor, PA-C   50 mg at 12/21/16 [id number]  . metoprolol tartrate (LOPRESSOR) tablet 25 mg  25 mg Oral QHS Skeet Simmer, RPH   25 mg at 12/20/16 2139  . multivitamin with minerals tablet 1 tablet  1 tablet Oral Daily Serafina Mitchell, MD   1 tablet at 12/21/16 0956  . naloxone [facility]) injection 0.4 mg  0.4 mg Intravenous PRN Micheline Rough, MD       And  . sodium chloride flush (NS) 0.9 % injection 9 mL  9 mL Intravenous PRN Gene Domingo Cocking, MD      . ondansetron (ZOFRAN) injection 4 mg  4 mg Intravenous Q6H PRN Ulyses Amor, PA-C      . pantoprazole (PROTONIX) EC tablet 40 mg  40 mg Oral Daily Ulyses Amor, PA-C   40 mg at 12/21/16 0957  . phenol (CHLORASEPTIC) mouth spray 1 spray   1 spray Mouth/Throat PRN Ulyses Amor, PA-C      . senna (SENOKOT) tablet 8.6 mg  1 tablet Oral BID Micheline Rough, MD   8.6 mg at 12/21/16 0957  . silver nitrate applicators applicator 5 Stick  5  Stick Topical PRN Ulyses Amor, PA-C   5 Stick at 12/13/16 [phone]     Discharge Medications: Please see discharge summary for a list of discharge medications.  Relevant Imaging Results:  Relevant Lab Results:   Additional Information [id number]  [place], [place] B, LCSWA

## 2016-12-21 NOTE — Progress Notes (Signed)
Daily Progress Note   [name] Name: [name]       Date: 12/21/2016 DOB: 31-May-1928  Age: 83 y.o. MRN#: [id number] Attending Physician: Serafina Mitchell, MD Primary Care Physician: Henrine Screws, MD Admit Date: 12/11/2016  Reason for Consultation/Follow-up: Establishing goals of care and Pain control  Subjective: Follow up for pain management today.  [name] reports that pain is currently well controlled.  Her daughter reports PCA has "definitely" been better for pain control.  [name] is awake and alert.  Daughter reports she has resting most of the day, "she is still worn out" but no concerns for somnolence related to pain medication.   Length of Stay: 10  Current Medications: Scheduled Meds:  . ceFEPime (MAXIPIME) IV  1 g Intravenous Q24H  . clopidogrel  75 mg Oral Daily  . enoxaparin (LOVENOX) injection  30 mg Subcutaneous Q24H  . feeding supplement (ENSURE ENLIVE)  237 mL Oral BID BM  . gabapentin  600 mg Oral TID  . hydrochlorothiazide  6.25 mg Oral Daily  . HYDROmorphone   Intravenous Q4H  . irbesartan  75 mg Oral Daily  . metoprolol  50 mg Oral Daily  . metoprolol  25 mg Oral QHS  . multivitamin with minerals  1 tablet Oral Daily  . pantoprazole  40 mg Oral Daily  . senna  1 tablet Oral BID    Continuous Infusions: . sodium chloride 10 mL/hr at 12/19/16 1049  . lactated ringers 50 mL/hr at 12/17/16 1032    PRN Meds: acetaminophen, alum & mag hydroxide-simeth, diazepam, diphenhydrAMINE **OR** diphenhydrAMINE, guaiFENesin-dextromethorphan, hydrALAZINE, HYDROmorphone (DILAUDID) injection, labetalol, loperamide, magnesium sulfate 1 - 4 g bolus IVPB, metoprolol, naloxone **AND** sodium chloride flush, ondansetron, phenol, silver nitrate  applicators  Physical Exam      General: Alert, awake, in no acute distress.  Heart: Regular rate and rhythm. No murmur appreciated. Lungs: Fair air movement, scattered rales in bases Abdomen: Soft, nontender, nondistended, positive bowel sounds.  Ext: R TMA Skin: Warm and dry Neuro: Grossly intact, nonfocal.     Vital Signs: BP (!) 150/82 (BP Location: Right Arm)   Pulse 81   Temp 99 F (37.2 C) (Oral)   Resp 20   Ht 5\' 2"  (1.575 m)   Wt 57.7 kg (127 lb 1.6 oz)   SpO2 94%  BMI 23.25 kg/m  SpO2: SpO2: 94 % O2 Device: O2 Device: Not Delivered O2 Flow Rate: O2 Flow Rate (L/min): 2 L/min  Intake/output summary:  Intake/Output Summary (Last 24 hours) at 12/21/16 1606 Last data filed at 12/21/16 1115  Gross per 24 hour  Intake                0 ml  Output             1375 ml  Net            -1375 ml   LBM: Last BM Date: 12/21/16 Baseline Weight: Weight: 58.2 kg (128 lb 4.9 oz) Most recent weight: Weight: 57.7 kg (127 lb 1.6 oz)       Palliative Assessment/Data:    Flowsheet Rows   Flowsheet Row Most Recent Value  Intake Tab  Referral Department  Surgery  Unit at Time of Referral  Med/Surg Unit  Palliative Care Primary Diagnosis  Other (Comment) [Peripheral vascular]  Date Notified  12/19/16  Palliative Care Type  New Palliative care  Reason for referral  Pain, Clarify Goals of Care  Date of Admission  12/11/16  Date first seen by Palliative Care  12/20/16  # of days Palliative referral response time  1 Day(s)  # of days IP prior to Palliative referral  8  Clinical Assessment  Palliative Performance Scale Score  50%  Pain Max last 24 hours  10  Pain Min Last 24 hours  2  Psychosocial & Spiritual Assessment  Palliative Care Outcomes  [name]/Family meeting held?  Yes  Who was at the meeting?  [name], 3 children      [name] Active Problem List   Diagnosis Date Noted  . Acute GI bleeding 10/23/2016  . Paroxysmal atrial fibrillation ([place]) 10/03/2016  .  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 PAD (peripheral artery disease) ([place]) 09/05/2016  . Groin pain 04/02/2015  . Aftercare following surgery of the circulatory system, [place] 12/13/2013  . Shingles 10/26/2013  . Anxiety 10/26/2013  . Acute blood loss anemia 09/05/2013  . Elbow fracture, left 09/05/2013  . Left acetabular fracture ([place]) 09/05/2013  . Ankle fracture, left 09/05/2013  . HTN (hypertension) 09/03/2013  . HLD (hyperlipidemia) 09/03/2013  . Peripheral vascular disease, unspecified 05/10/2012  . Chronic total occlusion of artery of the extremities ([place]) 01/19/2012    Palliative Care Assessment & Plan   [name]: 83 y.o. female  with past medical history of PAD/PVD status post multiple procedures, HTN, GI bleed (on Eliquis), atrial fibrillation admitted on 12/11/2016 for transmetatarsal amputation. Palliative consulted for goals of care and pain management.  Recommendations/Plan:  Pain: Pain control remains top priority for [name] and her family.  Reports feeling much better on PCA.  Query reveals 10.8mg  over the last 24 hours.  Continue same.  Family wants to discuss plan with [name] prior to having any further conversations regarding goals.  Will continue to follow.  Code Status:    Code Status Orders        Start     Ordered   12/20/16 1114  Limited resuscitation (code)  Continuous    Question Answer Comment  In the event of cardiac or respiratory ARREST: Initiate Code Blue, Call Rapid Response Yes   In the event of cardiac or respiratory ARREST: Perform CPR No   In the event of cardiac or respiratory ARREST: Perform Intubation/Mechanical Ventilation No   In the  event of cardiac or respiratory ARREST: Use NIPPV/BiPAp only if indicated Yes   In the event of cardiac or respiratory ARREST: Administer ACLS medications if indicated Yes   In the event of cardiac or respiratory ARREST:  Perform Defibrillation or Cardioversion if indicated No      12/20/16 1121    Code Status History    Date Active Date Inactive Code Status Order ID Comments User Context   12/11/2016  1:14 PM 12/20/2016 11:21 AM Full Code [id number]  Ulyses Amor, PA-C Inpatient   10/23/2016  7:46 PM 10/26/2016  2:19 PM Full Code [id number]  Vianne Bulls, MD Inpatient   09/24/2016  8:49 PM 10/03/2016 12:12 PM Full Code [id number]  Alvia Grove, PA-C Inpatient   09/09/2016  1:05 PM 09/09/2016  9:49 PM Full Code [id number]  Serafina Mitchell, MD Inpatient   09/05/2016  7:09 PM 09/06/2016  2:11 PM Full Code [id number]  Serafina Mitchell, MD Inpatient   12/26/2014  3:13 PM 12/26/2014  9:56 PM Full Code [id number]  Serafina Mitchell, MD Inpatient   09/01/2013 10:28 PM 09/08/2013  6:36 PM Full Code [id number]  Caroline, PA-C Inpatient    Advance Directive Documentation   Flowsheet Row Most Recent Value  Type of Advance Directive  Healthcare Power of Attorney, Living will  Pre-existing out of facility DNR order (yellow form or pink MOST form)  No data  "MOST" Form in Place?  No data       Prognosis:   Unable to determine  Discharge Planning:  To Be Determined  Care plan was discussed with [name], daughter  Thank you for allowing the Palliative Medicine Team to assist in the care of this [name].   Total Time 20 Prolonged Time Billed No      Greater than 50%  of this time was spent counseling and coordinating care related to the above assessment and plan.  Micheline Rough, MD  Please contact Palliative Medicine Team phone at [phone] for questions and concerns.

## 2016-12-22 DIAGNOSIS — D649 Anemia, unspecified: Secondary | ICD-10-CM

## 2016-12-22 DIAGNOSIS — I5043 Acute on chronic combined systolic (congestive) and diastolic (congestive) heart failure: Secondary | ICD-10-CM

## 2016-12-22 DIAGNOSIS — I5033 Acute on chronic diastolic (congestive) heart failure: Secondary | ICD-10-CM

## 2016-12-22 LAB — BASIC METABOLIC PANEL
ANION GAP: 9 (ref 5–15)
Anion gap: 11 (ref 5–15)
BUN: 14 mg/dL (ref 6–20)
BUN: 15 mg/dL (ref 6–20)
CALCIUM: 9.2 mg/dL (ref 8.9–10.3)
CALCIUM: 9 mg/dL (ref 8.9–10.3)
CO2: 29 mmol/L (ref 22–32)
CO2: 28 mmol/L (ref 22–32)
CREATININE: 1.06 mg/dL — AB (ref 0.44–1.00)
CREATININE: 0.95 mg/dL (ref 0.44–1.00)
Chloride: 98 mmol/L — ABNORMAL LOW (ref 101–111)
Chloride: 95 mmol/L — ABNORMAL LOW (ref 101–111)
GFR calc Af Amer: 54 mL/min — ABNORMAL LOW (ref >60)
GFR calc Af Amer: >60 mL/min (ref >60)
GFR calc non Af Amer: 46 mL/min — ABNORMAL LOW (ref >60)
GFR, EST NON AFRICAN AMERICAN: 53 mL/min — AB (ref >60)
GLUCOSE: 117 mg/dL — AB (ref 65–99)
GLUCOSE: 105 mg/dL — AB (ref 65–99)
Potassium: 4.2 mmol/L (ref 3.5–5.1)
Potassium: 4 mmol/L (ref 3.5–5.1)
Sodium: 136 mmol/L (ref 135–145)
Sodium: 134 mmol/L — ABNORMAL LOW (ref 135–145)

## 2016-12-22 MED ORDER — FUROSEMIDE 10 MG/ML IJ SOLN
20.0000 mg | Freq: Every day | INTRAMUSCULAR | Status: DC
  Administered 2016-12-22: 20 mg via INTRAVENOUS
  Filled 2016-12-22: qty 2

## 2016-12-22 NOTE — Progress Notes (Signed)
Occupational Therapy Treatment Patient Details Name: [name] MRN: [id number] DOB: [birth year] Today's Date: 12/22/2016    History of present illness Pt is an 83 y.o. female s/p RIGhT TRANSMETATARSAL AMPUTATION. Post-op course complicated by copious bleeding from Rt foot; return to OR for I&D and pain control issues. PMHx: Anginal pain, Arthritis, Chronic lower back pain, DVT, Fibromyalgia, GERD, High cholesterol, HTN, Macular degeneration, PVD, TIA.   OT comments  Pt limited by pain today. Discussed sitting EOB w/ Pt/sister in preparation for increased activity tolerance, endurance, getting into sitting position and focus on increased particiaption w/ ADL's. After lengthy discussion w/ pt/sister, pt reported that she would have to get to bed pan for BM. Pt was overall +2 physical assist to roll toward left side and place on bed pan. Pt states that she may be having further ampuation on RLE - currently waiting on MD discussion. RN Tech made aware that was pt on bedpan & would need assist when done.   Follow Up Recommendations  SNF;Supervision/Assistance - 24 hour    Equipment Recommendations  Other (comment) (Defer to next venue)    Recommendations for Other Services      Precautions / Restrictions Precautions Precautions: Fall;Other (comment) Precaution Comments: daughter reports pt not to push or pull with Lt elbow more than 5 lbs (s/p total elbow replacement)  Required Braces or Orthoses: Other Brace/Splint Other Brace/Splint: R darco shoe Restrictions Weight Bearing Restrictions: Yes RLE Weight Bearing: Partial weight bearing Other Position/Activity Restrictions: WB on heel R foot only in Darco shoe       Mobility Bed Mobility Overal bed mobility: Needs Assistance Bed Mobility: Rolling Rolling: Max assist;+2 for physical assistance;+2 for safety/equipment         General bed mobility comments: Max A +2 to roll to left to assist with placing bed pan for  BM.  Transfers                      Balance                                   ADL Overall ADL's : Needs assistance/impaired     Grooming: Set up;Supervision/safety;Bed level                   Toilet Transfer: Maximal assistance;+2 for physical assistance;+2 for safety/equipment;Cueing for safety;Cueing for sequencing (Rolling left, to get on bedpan. Used rail w/ R. Bed level transfer only) Toilet Transfer Details (indicate cue type and reason): Pt unable to stand for SPT to 3:1 therefore was +2 physical assist to place bed pan, rolling to left and reachign with RUE. Toileting- Clothing Manipulation and Hygiene: +2 for physical assistance;Maximal assistance;+2 for safety/equipment;Bed level         General ADL Comments: Discussed sitting EOB w/ Pt/family in preparation for increased activity tolerance, endurance, getting into sitting position and focus on increased particiaption w/ ADL's. After lengthy discussion w/ pt/sister, pt reported that she would have to get to bed pan for BM. Pt was overall +2 physical assist to roll toward left side and place on bed pan. Pt appears confused at times and sister assisted with pt functional level. Pt sister then called pt's daughter whom is an Therapist, sports, whom stated that pt is indeed not transfering to 3:1 and has only been using bed pan at bed level. Pt daughter also states that she may be having further  ampuation on RLE - currently waiting on MD discussion. Spoke w/ PT after assisting pt on bed pan and notified her that pt would like to sit EOB later as able.      Vision  Wears glasses                   Perception     Praxis      Cognition   Behavior During Therapy: Anxious;WFL for tasks assessed/performed Overall Cognitive Status: Difficult to assess                  General Comments: ?Confused at times. Had used PCA just prior to OT arrival. Asking who is in room, who is sister on phone with after  speaking with her daughter (whom was on the phone); asking multiple times (x3+) "What are we doing now, I need a picture of what will happen"  [facility])    Extremity/Trunk Assessment               Exercises     Shoulder Instructions       General Comments      Pertinent Vitals/ Pain       Pain Assessment: Faces Faces Pain Scale: Hurts whole lot Pain Location: R LE Pain Descriptors / Indicators: Grimacing;Guarding;Aching;Moaning Pain Intervention(s): Limited activity within patient's tolerance;Monitored during session;Repositioned;PCA encouraged  Home Living                                          Prior Functioning/Environment              Frequency  Min 2X/week        Progress Toward Goals  OT Goals(current goals can now be found in the care plan section)  Progress towards OT goals: Not progressing toward goals - comment (Pain limiting factor for pt; only able to participate in bed level mobility for bed pan placement prior to BM today)  Acute Rehab OT Goals Patient Stated Goal: Decreased pain  Plan Discharge plan remains appropriate    Co-evaluation                 End of Session Equipment Utilized During Treatment: Oxygen   Activity Tolerance Patient limited by pain;Other (comment) (Bed level ADL's only today, as tolerated)   Patient Left in bed;with call bell/phone within reach;with bed alarm set;with family/visitor present   Nurse Communication Other (comment) (Pt on bed pan (+2 Assist), wanting to have BM. Will need assist to get off. Would also like PT after if possible to sit EOB w/ assist)        Time: [id number] OT Time Calculation (min): 29 min  Charges: OT General Charges $OT Visit: 1 Procedure OT Treatments $Self Care/Home Management : 23-37 mins  Alfio Loescher Beth Dixon, OTR/L 12/22/2016, 12:07 PM

## 2016-12-22 NOTE — Progress Notes (Signed)
Progress Note  Patient Name: [name] Date of Encounter: 12/22/2016  Primary Cardiologist: Dr. Angelena Form  Subjective   Had episode of SOB overnight and still feels like she has some abdominal fluid on board, but no chest pain or palpitations. Pain control improved with PCA.  Inpatient Medications    Scheduled Meds: . ceFEPime (MAXIPIME) IV  1 g Intravenous Q24H  . clopidogrel  75 mg Oral Daily  . enoxaparin (LOVENOX) injection  30 mg Subcutaneous Q24H  . feeding supplement (ENSURE ENLIVE)  237 mL Oral BID BM  . gabapentin  600 mg Oral TID  . hydrochlorothiazide  6.25 mg Oral Daily  . HYDROmorphone   Intravenous Q4H  . irbesartan  75 mg Oral Daily  . metoprolol  50 mg Oral Daily  . metoprolol  25 mg Oral QHS  . multivitamin with minerals  1 tablet Oral Daily  . pantoprazole  40 mg Oral Daily  . senna  1 tablet Oral BID   Continuous Infusions: . sodium chloride 10 mL/hr at 12/19/16 1049  . lactated ringers 50 mL/hr at 12/17/16 1032   PRN Meds: acetaminophen, alum & mag hydroxide-simeth, diazepam, diphenhydrAMINE **OR** diphenhydrAMINE, guaiFENesin-dextromethorphan, hydrALAZINE, HYDROmorphone (DILAUDID) injection, labetalol, loperamide, magnesium sulfate 1 - 4 g bolus IVPB, metoprolol, naloxone **AND** sodium chloride flush, ondansetron, phenol, silver nitrate applicators   Vital Signs    Vitals:   12/21/16 2000 12/21/16 2016 12/22/16 0606 12/22/16 0800  BP:  (!) 162/55 (!) 170/58   Pulse:  98 81   Resp: 18 18 18 18   Temp:  (!) 101.2 F (38.4 C) 98 F (36.7 C)   TempSrc:  Oral Oral   SpO2: 93% 93% 97% 97%  Weight:      Height:        Intake/Output Summary (Last 24 hours) at 12/22/16 0958 Last data filed at 12/21/16 1738  Gross per 24 hour  Intake              240 ml  Output             1900 ml  Net            -1660 ml   Filed Weights   12/16/16 1900 12/19/16 0615  Weight: 128 lb 4.9 oz (58.2 kg) 127 lb 1.6 oz (57.7 kg)    Telemetry    NSR with  occ PACs - personally reviewed  ECG    N/a today  Physical Exam   GEN: No acute distress, elderly WF.  HEENT: Normocephalic, atraumatic, sclera non-icteric. Neck: No JVD or bruits. Cardiac: RRR no murmurs, rubs, or gallops.  Radials/DP/PT 1+ and equal bilaterally.  Respiratory: Diminished BS at bases bilaterally. Breathing is unlabored. GI: Soft, nontender, non-distended, BS +x 4. MS: no deformity. Extremities: No clubbing or cyanosis. No edema. R foot dressed. Distal pedal pulses are 2+ and equal bilaterally. Neuro:  AAOx3. Follows commands. Psych:  Responds to questions appropriately with a normal affect.  Labs    Chemistry Recent Labs Lab 12/20/16 0427 12/21/16 0125 12/22/16 0316  NA 135 133* 136  K 3.7 3.2* 4.2  CL 102 98* 98*  CO2 26 27 29   GLUCOSE 112* 100* 117*  BUN 12 13 14   CREATININE 0.91 0.90 1.06*  CALCIUM 8.9 8.9 9.2  GFRNONAA 56* 56* 46*  GFRAA >60 >60 54*  ANIONGAP 7 8 9      Hematology Recent Labs Lab 12/19/16 1156 12/20/16 0427 12/21/16 0125  WBC 12.5* 11.5* 12.7*  RBC 3.06*  2.71* 2.68*  HGB 8.9* 8.0* 7.8*  HCT 27.8* 24.5* 24.2*  MCV 90.8 90.4 90.3  MCH 29.1 29.5 29.1  MCHC 32.0 32.7 32.2  RDW 14.7 14.9 14.8  PLT 369 349 418*   Radiology    No results found.  Cardiac Studies   2D echo 12/19/16 - Left ventricle: The cavity size was normal. There was mild focal   basal hypertrophy of the septum. Systolic function was normal.   The estimated ejection fraction was in the range of 55% to 60%.   Wall motion was normal; there were no regional wall motion   abnormalities. - Aortic valve: There was mild to moderate regurgitation.   Regurgitation pressure half-time: 440 ms. - Mitral valve: Calcified annulus. There was mild regurgitation. - Left atrium: The atrium was mildly dilated. - Pulmonary arteries: PA peak pressure: 36 mm Hg (S). Impressions: - The right ventricular systolic pressure was increased consistent   with mild pulmonary  hypertension.  Patient Profile     83 y.o. female with PAD/PVD requiring popliteal artery bypass graft (2010, 2011) and left external iliac and left superficial femoral artery stenting (2016), and right superificial femoral artery stenting (2017); HTN, GI bleeding on eliquis (for Afib), paroxysmal atrial fibrillation, and negative nuc 09/2016 admitted for right transmetatarsal amputation which was performed 12/12/16, with re-expoloration for bleeding on 12/17/16. During admission developed AF RVR in the setting of acute respiratory failure, fever, acute CHF.  Assessment & Plan    1. PAD/ R TMA - per vascular surgery.  2. Paroxysmal atrial fib - maintaining NSR. Not a candidate for NOAC due to bleeding hx/anemia as above.  3. Acute on chronic diastolic CHF - will ask nurse to weigh patient. I/o's still indicate 2.9L up. She is on low dose HCTZ as part of her diovan HCT and has been getting one-time doses of Lasix 20mg  IV daily. Weight now closer to baseline.   4. Chronic anemia - will need to continue to trend given her Plavix use.  5. HTN - management will depend on decision regarding diuretic per review with MD. Alternatively could add amlodipine. In the past her baseline HR has been in the 60s which is why I think her beta blocker dose is somewhat unusual.  Signed, Charlie Pitter, PA-C  12/22/2016, 9:58 AM    I have personally seen and examined this patient with Melina Copa, PA-C. I agree with the assessment and plan as outlined above. She is in sinus. Would continue metoprolol. She is still 3 liters positive during the admission but overall her fluid status clinically appears better. Would give one time dose IV lasix today.   Lauree Chandler 12/22/2016 12:17 PM

## 2016-12-22 NOTE — Progress Notes (Addendum)
Daily Progress Note   Patient Name: [name]       Date: 12/22/2016 DOB: 09/18/1928  Age: 83 y.o. MRN#: [id number] Attending Physician: Karen Mitchell, MD Primary Care Physician: No primary care provider on file. Admit Date: 12/11/2016  Reason for Consultation/Follow-up: Establishing goals of care and Pain control  Subjective: Follow up for pain management today.  Patient reports that pain is currently well controlled.  She had a BM overnight.  See below for further details.  Length of Stay: 11  Current Medications: Scheduled Meds:  . ceFEPime (MAXIPIME) IV  1 g Intravenous Q24H  . clopidogrel  75 mg Oral Daily  . enoxaparin (LOVENOX) injection  30 mg Subcutaneous Q24H  . feeding supplement (ENSURE ENLIVE)  237 mL Oral BID BM  . furosemide  20 mg Intravenous Daily  . gabapentin  600 mg Oral TID  . hydrochlorothiazide  6.25 mg Oral Daily  . HYDROmorphone   Intravenous Q4H  . irbesartan  75 mg Oral Daily  . metoprolol  50 mg Oral Daily  . metoprolol  25 mg Oral QHS  . multivitamin with minerals  1 tablet Oral Daily  . pantoprazole  40 mg Oral Daily  . senna  1 tablet Oral BID    Continuous Infusions: . sodium chloride 10 mL/hr at 12/19/16 1049  . lactated ringers 50 mL/hr at 12/17/16 1032    PRN Meds: acetaminophen, alum & mag hydroxide-simeth, diazepam, diphenhydrAMINE **OR** diphenhydrAMINE, guaiFENesin-dextromethorphan, hydrALAZINE, HYDROmorphone (DILAUDID) injection, labetalol, loperamide, magnesium sulfate 1 - 4 g bolus IVPB, metoprolol, naloxone **AND** sodium chloride flush, ondansetron, phenol, silver nitrate applicators  Physical Exam      General: Alert, awake, in no acute distress.  Heart: Regular rate and rhythm. No murmur appreciated. Lungs: Fair air  movement, scattered rales in bases Abdomen: Soft, nontender, nondistended, positive bowel sounds.  Ext: R TMA Skin: Warm and dry Neuro: Grossly intact, nonfocal.     Vital Signs: BP (!) 150/51 (BP Location: Right Arm)   Pulse 80   Temp 99.1 F (37.3 C) (Oral)   Resp 19   Ht 5\' 2"  (1.575 m)   Wt 57.7 kg (127 lb 1.6 oz)   SpO2 98%   BMI 23.25 kg/m  SpO2: SpO2: 98 % O2 Device: O2 Device: Nasal Cannula O2 Flow Rate: O2 Flow  Rate (L/min): 6 L/min  Intake/output summary:   Intake/Output Summary (Last 24 hours) at 12/22/16 1628 Last data filed at 12/22/16 1500  Gross per 24 hour  Intake          1247.17 ml  Output             1200 ml  Net            47.17 ml   LBM: Last BM Date: 12/22/16 Baseline Weight: Weight: 58.2 kg (128 lb 4.9 oz) Most recent weight: Weight: 57.7 kg (127 lb 1.6 oz)       Palliative Assessment/Data:    Flowsheet Rows   Flowsheet Row Most Recent Value  Intake Tab  Referral Department  Surgery  Unit at Time of Referral  Med/Surg Unit  Palliative Care Primary Diagnosis  Other (Comment) [Peripheral vascular]  Date Notified  12/19/16  Palliative Care Type  New Palliative care  Reason for referral  Pain, Clarify Goals of Care  Date of Admission  12/11/16  Date first seen by Palliative Care  12/20/16  # of days Palliative referral response time  1 Day(s)  # of days IP prior to Palliative referral  8  Clinical Assessment  Palliative Performance Scale Score  50%  Pain Max last 24 hours  10  Pain Min Last 24 hours  2  Psychosocial & Spiritual Assessment  Palliative Care Outcomes  Patient/Family meeting held?  Yes  Who was at the meeting?  Patient, 3 children      Patient Active Problem List   Diagnosis Date Noted  . Acute on chronic combined systolic and diastolic CHF (congestive heart failure) ([place]) 12/22/2016  . Anemia 12/22/2016  . Palliative care encounter   . Lower extremity pain, right   . Acute GI bleeding 10/23/2016  . Paroxysmal  atrial fibrillation ([place]) 10/03/2016  . 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 PAD (peripheral artery disease) ([place]) 09/05/2016  . Groin pain 04/02/2015  . Aftercare following surgery of the circulatory system, [place] 12/13/2013  . Shingles 10/26/2013  . Anxiety 10/26/2013  . Acute blood loss anemia 09/05/2013  . Elbow fracture, left 09/05/2013  . Left acetabular fracture ([place]) 09/05/2013  . Ankle fracture, left 09/05/2013  . HTN (hypertension) 09/03/2013  . HLD (hyperlipidemia) 09/03/2013  . Peripheral vascular disease, unspecified 05/10/2012  . Chronic total occlusion of artery of the extremities ([place]) 01/19/2012    Palliative Care Assessment & Plan   Patient Profile: 83 y.o. female  with past medical history of PAD/PVD status post multiple procedures, HTN, GI bleed (on Eliquis), atrial fibrillation admitted on 12/11/2016 for transmetatarsal amputation. Palliative consulted for goals of care and pain management.  Recommendations/Plan:  Pain: Pain control remains top priority for patient and her family.  Reports feeling much better on PCA.  Query reveals 3.2mg  per PCA over the last 24 hours.  In addition to this, she has also received 3mg  via IV push boluses from nursing.  I had originally ordered these as boluses off the pump, but they were changed to IV push by pharmacy.  While this does not change things from a clinical perspective, it should be noted to look at both of these when calculating total daily opioid usage.  Total 24 hour usage of 6.2mg , down from 10mg  over prior 24 hours.  Family reports they would like to continue same regimen.   Her family reports "confusion" described as being more talkative and outgoing  than usual.  They reports her sense of humor is different as well.  Her daughter reports that it is not something one would not notice if they did not know her well.  (She is correct in this as [name]  appeared appropriate to me on exam.)  We discussed that this could be due to multiple factors (poor sleep, uncontrolled pain, pain medications, constipation, being in hospital, etc).  I am not convinced that this is side effect to opioids.  We did discuss possibility of decreasing opioids versus rotation to another opioid if this progresses.  Her daughter requested to continue same regimen for now.    Family wants to discuss plan with [name] prior to having any further conversations regarding goals.  I did discuss with her daughter (in the hall) that a decision to forgo surgery would make comfort care the likely pathway forward.  We discussed this would entail focusing on pain and symptom management with likely progression of ischemia and eventual development of infection in her lower extremity.  We also discussed that having another surgery was not a guarantee that she would not have further complications. We agree that having Dr. Stephens Shire input is next step in moving forward.      I am transitioning off service, however, a member of the palliative team will continue to follow.    Code Status:    Code Status Orders        Start     Ordered   12/20/16 1114  Limited resuscitation (code)  Continuous    Question Answer Comment  In the event of cardiac or respiratory ARREST: Initiate Code Blue, Call Rapid Response Yes   In the event of cardiac or respiratory ARREST: Perform CPR No   In the event of cardiac or respiratory ARREST: Perform Intubation/Mechanical Ventilation No   In the event of cardiac or respiratory ARREST: Use NIPPV/BiPAp only if indicated Yes   In the event of cardiac or respiratory ARREST: Administer ACLS medications if indicated Yes   In the event of cardiac or respiratory ARREST: Perform Defibrillation or Cardioversion if indicated No      12/20/16 1121    Code Status History    Date Active Date Inactive Code Status Order ID Comments User Context   12/11/2016  1:14 PM  12/20/2016 11:21 AM Full Code [id number]  Ulyses Amor, PA-C Inpatient   10/23/2016  7:46 PM 10/26/2016  2:19 PM Full Code [id number]  Vianne Bulls, MD Inpatient   09/24/2016  8:49 PM 10/03/2016 12:12 PM Full Code [id number]  Alvia Grove, PA-C Inpatient   09/09/2016  1:05 PM 09/09/2016  9:49 PM Full Code [id number]  Karen Mitchell, MD Inpatient   09/05/2016  7:09 PM 09/06/2016  2:11 PM Full Code [id number]  Karen Mitchell, MD Inpatient   12/26/2014  3:13 PM 12/26/2014  9:56 PM Full Code [id number]  Karen Mitchell, MD Inpatient   09/01/2013 10:28 PM 09/08/2013  6:36 PM Full Code [id number]  [place], PA-C Inpatient    Advance Directive Documentation   Flowsheet Row Most Recent Value  Type of Advance Directive  Healthcare Power of Attorney, Living will  Pre-existing out of facility DNR order (yellow form or pink MOST form)  No data  "MOST" Form in Place?  No data       Prognosis:   Unable to determine  Discharge Planning:  To Be Determined  Care plan was discussed with patient, daughter  Thank you  for allowing the Palliative Medicine Team to assist in the care of this patient.   Total Time 30 Prolonged Time Billed No      Greater than 50%  of this time was spent counseling and coordinating care related to the above assessment and plan.  Micheline Rough, MD  Please contact Palliative Medicine Team phone at [phone] for questions and concerns.

## 2016-12-22 NOTE — Progress Notes (Addendum)
Pharmacy Antibiotic Note  [name] is a 83 y.o. female admitted on 12/11/2016 with pan-sensitive pseudomonas UTI.  Pharmacy has been consulted for cefepime dosing - day #4. Possible plans for transmetatarsal amputation.  Tmax of 101.2 in last 24h, WBC up 12.7. SCr up a bit to 1.06, CrCl ~5ml/min.  Plan: Cefepime 1g IV Q24h Monitor clinical picture, renal function, c/s F/U transition to PO, LOT    Height: 5\' 2"  (157.5 cm) Weight: 127 lb 1.6 oz (57.7 kg) IBW/kg (Calculated) : 50.1  Temp (24hrs), Avg:99.4 F (37.4 C), Min:98 F (36.7 C), Max:101.2 F (38.4 C)   Recent Labs Lab 12/17/16 0432 12/18/16 0634 12/19/16 1156 12/20/16 0427 12/21/16 0125 12/22/16 0316  WBC 12.2* 10.1 12.5* 11.5* 12.7*  --   CREATININE 0.94 1.00 0.96 0.91 0.90 1.06*    Estimated Creatinine Clearance: 30.1 mL/min (by C-G formula based on SCr of 1.06 mg/dL (H)).    Allergies  Allergen Reactions  . Motrin [Ibuprofen] Other (See Comments)    ADVERSE REACTION - GI BLEED  . Statins Other (See Comments)    ADVERSE REACTION MUSCLE PAIN & WEAKNESS  . Morphine And Related Other (See Comments)    HALLUCINATIONS REACTION IS SIDE EFFECT  . Oxycontin [Oxycodone Hcl] Other (See Comments)    [REACTION IS SIDE EFFECT]  Hallinatations  . Promethazine Hcl Other (See Comments)    IV  Drug only, makes her act crazy  . Sulfa Antibiotics Nausea And Vomiting    Antimicrobials this admission: Cefuroxime 1/25 >> 1/26; 1/30 >> 2/2 Cephalexin 1/28 >> 1/30 Cefepime 2/2 >>  Dose adjustments this admission: n/a  Microbiology results: 1/30 UCx: Pseudomonas (pan-S)   Elicia Lamp, PharmD, BCPS Clinical Pharmacist 12/22/2016 10:37 AM

## 2016-12-22 NOTE — Progress Notes (Signed)
Subjective  -   Still with pain in right foot Has not been able to get out of bed   Physical Exam:  Dressing from right TMA removed.  She has non-viable tissue surrounding her incision The calf is warm to the touch       Assessment/Plan:    I had an extensive discussion witht the patient and family.  I do not think that the TMA is going to heal based on the appearance today.  She is going to require a more proximal amputation.  Hopefully this will help with pain control.  We discussed AKA abd BKA.  That decision will be made tomorrow.  We have decided to go ahead and get this done so that we can better focus on her recovery  [place], [place] 12/22/2016 6:00 PM --  Vitals:   12/22/16 1200 12/22/16 1227  BP:  (!) 150/51  Pulse:  80  Resp: 20 19  Temp:  99.1 F (37.3 C)    Intake/Output Summary (Last 24 hours) at 12/22/16 1800 Last data filed at 12/22/16 1500  Gross per 24 hour  Intake          1007.17 ml  Output                0 ml  Net          1007.17 ml     Laboratory CBC    Component Value Date/Time   WBC 12.7 (H) 12/21/2016 0125   HGB 7.8 (L) 12/21/2016 0125   HCT 24.2 (L) 12/21/2016 0125   PLT 418 (H) 12/21/2016 0125    BMET    Component Value Date/Time   NA 134 (L) 12/22/2016 1052   K 4.0 12/22/2016 1052   CL 95 (L) 12/22/2016 1052   CO2 28 12/22/2016 1052   GLUCOSE 105 (H) 12/22/2016 1052   BUN 15 12/22/2016 1052   CREATININE 0.95 12/22/2016 1052   CALCIUM 9.0 12/22/2016 1052   GFRNONAA 53 (L) 12/22/2016 1052   GFRAA >60 12/22/2016 1052    COAG Lab Results  Component Value Date   INR 1.00 10/23/2016   INR 1.01 09/24/2016   INR 1.01 09/02/2013   No results found for: PTT  Antibiotics Anti-infectives    Start     Dose/Rate Route Frequency Ordered Stop   12/19/16 1000  ceFEPIme (MAXIPIME) 1 g in dextrose 5 % 50 mL IVPB     1 g 100 mL/hr over 30 Minutes Intravenous Every 24 hours 12/19/16 0858     12/16/16 2000  cefUROXime  (ZINACEF) 750 mg in dextrose 5 % 50 mL IVPB  Status:  Discontinued     750 mg 100 mL/hr over 30 Minutes Intravenous Every 8 hours 12/16/16 1855 12/19/16 0857   12/14/16 0800  cephALEXin (KEFLEX) capsule 250 mg  Status:  Discontinued     250 mg Oral Every 8 hours 12/14/16 0717 12/14/16 0722   12/14/16 0800  cephALEXin (KEFLEX) capsule 500 mg  Status:  Discontinued     500 mg Oral Every 8 hours 12/14/16 0722 12/16/16 1913   12/11/16 2000  cefUROXime (ZINACEF) 1.5 g in dextrose 5 % 50 mL IVPB     1.5 g 100 mL/hr over 30 Minutes Intravenous Every 12 hours 12/11/16 1217 12/12/16 0830   12/11/16 0728  cefUROXime (ZINACEF) 1.5 g in dextrose 5 % 50 mL IVPB     1.5 g 100 mL/hr over 30 Minutes Intravenous 30 min pre-op 12/11/16 0728 12/11/16 0935  Eldridge Abrahams, M.D. [facility] Office: [phone] Pager:  [phone]

## 2016-12-22 NOTE — Progress Notes (Signed)
Rehab admissions - I met with patient and her daughter.  Daughter tells me of possible plans for further amputation surgery.  Not ready for inpatient rehab at this point.  Call me for questions.  #[phone]

## 2016-12-22 NOTE — Progress Notes (Signed)
PCA syringe replaced, 2.5 ml  Hydromorphone wasted in sharp container. Velora Mediate

## 2016-12-22 NOTE — Clinical Social Work Note (Signed)
Clinical Social Work Assessment  Patient Details  Name: [name] MRN: [id number] Date of Birth: [birth year]  Date of referral:  12/22/16               Reason for consult:  Discharge Planning                Permission sought to share information with:  Family Supports Permission granted to share information::  Yes, Verbal Permission Granted  Name::     [place]::     Relationship::  daughter  Contact Information:  [phone]  Housing/Transportation Living arrangements for the past 2 months:  Single Family Home Source of Information:  Adult Children Patient Interpreter Needed:  None Criminal Activity/Legal Involvement Pertinent to Current Situation/Hospitalization:  No - Comment as needed Significant Relationships:  Spouse, Adult Children Lives with:  Spouse Do you feel safe going back to the place where you live?  Yes Need for family participation in patient care:  No (Coment)  Care giving concerns: Patient had several family members in the room   Social Worker assessment / plan: Clinical Social Worker met patient at bedside to offer support and discuss patients needs at discharge. Patient had several family members in the room. Patients daughter [name] stepped outside with CSW to discuss discharge plan for patient. [name] stated she is agreeable for patient to discharge to SNF if its necessary. [name] stated she would prefer [facility].[name] stated that patient lives at home with spouse but that she visit the home regularly. [name] stated if she needed to be with patient 24/hr she can be. CSW to complete necessary paperwork and initiate SNF search in patient behalf. CSW to follow up with patient once bed offers are available. CSW remains available for support and to facilitate patient discharge needs once medically stable.  Employment status:  Retired Forensic scientist:  Medicare PT Recommendations:  [place] / Referral to community  resources:  [place]  Patient/Family's Response to care:  Patient verbalized appreciation and understanding for CSW role and involvement in care. Patient agreeable with current discharge plan to SNF.   Patient/Family's Understanding of and Emotional Response to Diagnosis, Current Treatment, and Prognosis: Patient with good undertsaning of current medical state and limitations around most recent hospitalization. Patient agreeable with SNF placement on hopes of transitioning back home to spouse.   Emotional Assessment Appearance:  Appears stated age Attitude/Demeanor/Rapport:  Other Affect (typically observed):  Happy Orientation:    Alcohol / Substance use:  Not Applicable Psych involvement (Current and /or in the community):  No (Comment)  Discharge Needs  Concerns to be addressed:  No discharge needs identified Readmission within the last 30 days:  No Current discharge risk:  None Barriers to Discharge:  No Barriers Identified   Wende Neighbors, LCSW 12/22/2016, 3:14 PM

## 2016-12-23 ENCOUNTER — Encounter (HOSPITAL_COMMUNITY)
Admission: RE | Disposition: A | Discharge status: Rehabilitation Facility | Payer: Self-pay | Source: Ambulatory Visit | Attending: Surgery

## 2016-12-23 ENCOUNTER — Inpatient Hospital Stay (HOSPITAL_COMMUNITY): Payer: Medicare Other | Admitting: Certified Registered"

## 2016-12-23 DIAGNOSIS — T8781 Dehiscence of amputation stump: Secondary | ICD-10-CM

## 2016-12-23 DIAGNOSIS — I5043 Acute on chronic combined systolic (congestive) and diastolic (congestive) heart failure: Secondary | ICD-10-CM

## 2016-12-23 HISTORY — PX: AMPUTATION: SHX166

## 2016-12-23 LAB — BASIC METABOLIC PANEL
Anion gap: 10 (ref 5–15)
Anion gap: 11 (ref 5–15)
BUN: 14 mg/dL (ref 6–20)
BUN: 13 mg/dL (ref 6–20)
CALCIUM: 9.1 mg/dL (ref 8.9–10.3)
CALCIUM: 9.2 mg/dL (ref 8.9–10.3)
CO2: 29 mmol/L (ref 22–32)
CO2: 29 mmol/L (ref 22–32)
CREATININE: 1.02 mg/dL — AB (ref 0.44–1.00)
CREATININE: 0.92 mg/dL (ref 0.44–1.00)
Chloride: 95 mmol/L — ABNORMAL LOW (ref 101–111)
Chloride: 95 mmol/L — ABNORMAL LOW (ref 101–111)
GFR calc non Af Amer: 48 mL/min — ABNORMAL LOW (ref >60)
GFR calc non Af Amer: 55 mL/min — ABNORMAL LOW (ref >60)
GFR, EST AFRICAN AMERICAN: 56 mL/min — AB (ref >60)
GLUCOSE: 114 mg/dL — AB (ref 65–99)
Glucose, Bld: 111 mg/dL — ABNORMAL HIGH (ref 65–99)
Potassium: 4 mmol/L (ref 3.5–5.1)
Potassium: 4 mmol/L (ref 3.5–5.1)
Sodium: 134 mmol/L — ABNORMAL LOW (ref 135–145)
Sodium: 135 mmol/L (ref 135–145)

## 2016-12-23 LAB — CBC
HCT: 26 % — ABNORMAL LOW (ref 36.0–46.0)
HCT: 25.4 % — ABNORMAL LOW (ref 36.0–46.0)
Hemoglobin: 8.2 g/dL — ABNORMAL LOW (ref 12.0–15.0)
Hemoglobin: 7.9 g/dL — ABNORMAL LOW (ref 12.0–15.0)
MCH: 28.9 pg (ref 26.0–34.0)
MCH: 28.5 pg (ref 26.0–34.0)
MCHC: 31.5 g/dL (ref 30.0–36.0)
MCHC: 31.1 g/dL (ref 30.0–36.0)
MCV: 91.5 fL (ref 78.0–100.0)
MCV: 91.7 fL (ref 78.0–100.0)
PLATELETS: 477 10*3/uL — AB (ref 150–400)
Platelets: 528 10*3/uL — ABNORMAL HIGH (ref 150–400)
RBC: 2.84 MIL/uL — AB (ref 3.87–5.11)
RBC: 2.77 MIL/uL — ABNORMAL LOW (ref 3.87–5.11)
RDW: 14.8 % (ref 11.5–15.5)
RDW: 14.6 % (ref 11.5–15.5)
WBC: 12.6 10*3/uL — ABNORMAL HIGH (ref 4.0–10.5)
WBC: 13.1 10*3/uL — ABNORMAL HIGH (ref 4.0–10.5)

## 2016-12-23 LAB — PROTIME-INR
INR: 1.14
PROTHROMBIN TIME: 14.7 s (ref 11.4–15.2)

## 2016-12-23 SURGERY — AMPUTATION BELOW KNEE
Anesthesia: Regional | Site: Leg Lower | Laterality: Right

## 2016-12-23 MED ORDER — ROPIVACAINE HCL 7.5 MG/ML IJ SOLN
INTRAMUSCULAR | Status: DC | PRN
  Administered 2016-12-23 (×2): 10 mL via PERINEURAL

## 2016-12-23 MED ORDER — PROPOFOL 1000 MG/100ML IV EMUL
INTRAVENOUS | Status: AC
  Filled 2016-12-23: qty 100

## 2016-12-23 MED ORDER — PROPOFOL 10 MG/ML IV BOLUS
INTRAVENOUS | Status: AC
  Filled 2016-12-23: qty 20

## 2016-12-23 MED ORDER — LIDOCAINE 2% (20 MG/ML) 5 ML SYRINGE
INTRAMUSCULAR | Status: AC
  Filled 2016-12-23: qty 5

## 2016-12-23 MED ORDER — PROPOFOL 500 MG/50ML IV EMUL
INTRAVENOUS | Status: DC | PRN
  Administered 2016-12-23: 30 ug/kg/min via INTRAVENOUS

## 2016-12-23 MED ORDER — BUPIVACAINE-EPINEPHRINE (PF) 0.5% -1:200000 IJ SOLN
INTRAMUSCULAR | Status: DC | PRN
  Administered 2016-12-23: 10 mL via PERINEURAL
  Administered 2016-12-23: 15 mL via PERINEURAL

## 2016-12-23 MED ORDER — FENTANYL CITRATE (PF) 100 MCG/2ML IJ SOLN
INTRAMUSCULAR | Status: AC
  Filled 2016-12-23: qty 4

## 2016-12-23 MED ORDER — LACTATED RINGERS IV SOLN
INTRAVENOUS | Status: DC
  Administered 2016-12-23 (×2): via INTRAVENOUS

## 2016-12-23 MED ORDER — ONDANSETRON HCL 4 MG/2ML IJ SOLN
INTRAMUSCULAR | Status: DC | PRN
  Administered 2016-12-23: 4 mg via INTRAVENOUS

## 2016-12-23 MED ORDER — 0.9 % SODIUM CHLORIDE (POUR BTL) OPTIME
TOPICAL | Status: DC | PRN
  Administered 2016-12-23: 1000 mL

## 2016-12-23 MED ORDER — HYDROMORPHONE HCL 1 MG/ML IJ SOLN
INTRAMUSCULAR | Status: AC
  Filled 2016-12-23: qty 1

## 2016-12-23 MED ORDER — CEFAZOLIN IN D5W 1 GM/50ML IV SOLN
1.0000 g | INTRAVENOUS | Status: AC
Start: 2016-12-23 — End: 2016-12-23
  Administered 2016-12-23: 1 g via INTRAVENOUS
  Filled 2016-12-23 (×2): qty 50

## 2016-12-23 MED ORDER — PROPOFOL 10 MG/ML IV BOLUS
INTRAVENOUS | Status: DC | PRN
  Administered 2016-12-23: 20 mg via INTRAVENOUS
  Administered 2016-12-23: 50 mg via INTRAVENOUS

## 2016-12-23 MED ORDER — LIDOCAINE HCL (CARDIAC) 20 MG/ML IV SOLN
INTRAVENOUS | Status: DC | PRN
  Administered 2016-12-23: 100 mg via INTRATRACHEAL

## 2016-12-23 MED ORDER — METOPROLOL TARTRATE 50 MG PO TABS
50.0000 mg | ORAL_TABLET | Freq: Once | ORAL | Status: AC
  Administered 2016-12-23: 50 mg via ORAL
  Filled 2016-12-23: qty 1

## 2016-12-23 MED ORDER — FENTANYL CITRATE (PF) 100 MCG/2ML IJ SOLN
INTRAMUSCULAR | Status: DC | PRN
  Administered 2016-12-23: 50 ug via INTRAVENOUS
  Administered 2016-12-23: 25 ug via INTRAVENOUS
  Administered 2016-12-23 (×2): 50 ug via INTRAVENOUS
  Administered 2016-12-23: 25 ug via INTRAVENOUS

## 2016-12-23 SURGICAL SUPPLY — 64 items
ADH SKN CLS APL DERMABOND .7 (GAUZE/BANDAGES/DRESSINGS) ×1
BANDAGE ACE 4X5 VEL STRL LF (GAUZE/BANDAGES/DRESSINGS) ×2 IMPLANT
BANDAGE ACE 6X5 VEL STRL LF (GAUZE/BANDAGES/DRESSINGS) IMPLANT
BANDAGE ELASTIC 4 VELCRO ST LF (GAUZE/BANDAGES/DRESSINGS) ×1 IMPLANT
BANDAGE ESMARK 6X9 LF (GAUZE/BANDAGES/DRESSINGS) IMPLANT
BNDG CMPR 9X6 STRL LF SNTH (GAUZE/BANDAGES/DRESSINGS)
BNDG COHESIVE 6X5 TAN STRL LF (GAUZE/BANDAGES/DRESSINGS) ×2 IMPLANT
BNDG ESMARK 6X9 LF (GAUZE/BANDAGES/DRESSINGS)
BNDG GAUZE ELAST 4 BULKY (GAUZE/BANDAGES/DRESSINGS) ×1 IMPLANT
CANISTER SUCTION 2500CC (MISCELLANEOUS) ×2 IMPLANT
CLIP TI MEDIUM 6 (CLIP) IMPLANT
COVER SURGICAL LIGHT HANDLE (MISCELLANEOUS) ×2 IMPLANT
CUFF TOURNIQUET SINGLE 34IN LL (TOURNIQUET CUFF) IMPLANT
CUFF TOURNIQUET SINGLE 44IN (TOURNIQUET CUFF) IMPLANT
DERMABOND ADVANCED (GAUZE/BANDAGES/DRESSINGS) ×1
DERMABOND ADVANCED .7 DNX12 (GAUZE/BANDAGES/DRESSINGS) IMPLANT
DRAIN CHANNEL 19F RND (DRAIN) IMPLANT
DRAPE ORTHO SPLIT 77X108 STRL (DRAPES) ×6
DRAPE PROXIMA HALF (DRAPES) ×2 IMPLANT
DRAPE SURG ORHT 6 SPLT 77X108 (DRAPES) ×2 IMPLANT
DRSG ADAPTIC 3X8 NADH LF (GAUZE/BANDAGES/DRESSINGS) ×2 IMPLANT
DRSG PAD ABDOMINAL 8X10 ST (GAUZE/BANDAGES/DRESSINGS) ×1 IMPLANT
ELECT REM PT RETURN 9FT ADLT (ELECTROSURGICAL) ×2
ELECTRODE REM PT RTRN 9FT ADLT (ELECTROSURGICAL) ×1 IMPLANT
EVACUATOR SILICONE 100CC (DRAIN) IMPLANT
GAUZE SPONGE 4X4 12PLY STRL (GAUZE/BANDAGES/DRESSINGS) ×2 IMPLANT
GAUZE SPONGE 4X4 16PLY XRAY LF (GAUZE/BANDAGES/DRESSINGS) ×2 IMPLANT
GLOVE BIO SURGEON STRL SZ8 (GLOVE) ×1 IMPLANT
GLOVE BIOGEL PI IND STRL 7.0 (GLOVE) IMPLANT
GLOVE BIOGEL PI IND STRL 7.5 (GLOVE) ×1 IMPLANT
GLOVE BIOGEL PI INDICATOR 7.0 (GLOVE) ×1
GLOVE BIOGEL PI INDICATOR 7.5 (GLOVE) ×1
GLOVE SURG SS PI 7.0 STRL IVOR (GLOVE) ×1 IMPLANT
GLOVE SURG SS PI 7.5 STRL IVOR (GLOVE) ×2 IMPLANT
GOWN STRL REUS W/ TWL LRG LVL3 (GOWN DISPOSABLE) ×2 IMPLANT
GOWN STRL REUS W/ TWL XL LVL3 (GOWN DISPOSABLE) ×1 IMPLANT
GOWN STRL REUS W/TWL LRG LVL3 (GOWN DISPOSABLE) ×2
GOWN STRL REUS W/TWL XL LVL3 (GOWN DISPOSABLE) ×6
KIT BASIN OR (CUSTOM PROCEDURE TRAY) ×2 IMPLANT
KIT ROOM TURNOVER OR (KITS) ×2 IMPLANT
NS IRRIG 1000ML POUR BTL (IV SOLUTION) ×2 IMPLANT
PACK GENERAL/GYN (CUSTOM PROCEDURE TRAY) ×2 IMPLANT
PAD ARMBOARD 7.5X6 YLW CONV (MISCELLANEOUS) ×4 IMPLANT
PADDING CAST COTTON 6X4 STRL (CAST SUPPLIES) IMPLANT
SAW GIGLI STERILE 20 (MISCELLANEOUS) ×2 IMPLANT
STAPLER VISISTAT 35W (STAPLE) ×2 IMPLANT
STOCKINETTE IMPERVIOUS LG (DRAPES) ×2 IMPLANT
SUT BONE WAX W31G (SUTURE) IMPLANT
SUT ETHILON 3 0 PS 1 (SUTURE) IMPLANT
SUT SILK 0 TIES 10X30 (SUTURE) ×2 IMPLANT
SUT SILK 2 0 (SUTURE)
SUT SILK 2 0 SH CR/8 (SUTURE) ×2 IMPLANT
SUT SILK 2-0 18XBRD TIE 12 (SUTURE) IMPLANT
SUT SILK 3 0 (SUTURE)
SUT SILK 3-0 18XBRD TIE 12 (SUTURE) IMPLANT
SUT VIC AB 2-0 CT1 18 (SUTURE) ×5 IMPLANT
SUT VIC AB 4-0 SH 27 (SUTURE) ×4
SUT VIC AB 4-0 SH 27XBRD (SUTURE) IMPLANT
SUT VICRYL 4-0 PS2 18IN ABS (SUTURE) ×2 IMPLANT
TAPE UMBILICAL COTTON 1/8X30 (MISCELLANEOUS) ×1 IMPLANT
TOWEL OR 17X24 6PK STRL BLUE (TOWEL DISPOSABLE) ×2 IMPLANT
TOWEL OR 17X26 10 PK STRL BLUE (TOWEL DISPOSABLE) ×2 IMPLANT
UNDERPAD 30X30 (UNDERPADS AND DIAPERS) ×2 IMPLANT
WATER STERILE IRR 1000ML POUR (IV SOLUTION) ×1 IMPLANT

## 2016-12-23 NOTE — Transfer of Care (Signed)
Immediate Anesthesia Transfer of Care Note  Patient: [name]  Procedure(s) Performed: Procedure(s): AMPUTATION BELOW KNEE (Right)  Patient Location: PACU  Anesthesia Type:General and Regional  Level of Consciousness: awake and patient cooperative  Airway & Oxygen Therapy: Patient Spontanous Breathing and Patient connected to nasal cannula oxygen  Post-op Assessment: Report given to RN and Post -op Vital signs reviewed and stable  Post vital signs: Reviewed and stable  Last Vitals:  Vitals:   12/23/16 0800 12/23/16 1238  BP:  (!) 153/120  Pulse:  82  Resp: 16 (!) 9  Temp:  36.9 C    Last Pain:  Vitals:   12/23/16 1238  TempSrc:   PainSc: (P) 0-No pain      Patients Stated Pain Goal: 3 ([id number])  Complications: No apparent anesthesia complications

## 2016-12-23 NOTE — Anesthesia Procedure Notes (Signed)
Anesthesia Regional Block:  Popliteal block  Pre-Anesthetic Checklist: ,, timeout performed, Correct Patient, Correct Site, Correct Laterality, Correct Procedure, Correct Position, site marked, Risks and benefits discussed,  Surgical consent,  Pre-op evaluation,  At surgeon's request and post-op pain management  Laterality: Lower and Right  Prep: chloraprep       Needles:  Injection technique: Single-shot  Needle Type: Echogenic Needle          Additional Needles:  Procedures: ultrasound guided (picture in chart) and nerve stimulator Popliteal block  Nerve Stimulator or Paresthesia:  Response: plantarflexion, 0.4 mA,   Additional Responses:   Narrative:  Start time: 12/23/2016 10:50 AM End time: 12/23/2016 10:56 AM Injection made incrementally with aspirations every 5 mL.  Performed by: Personally   Additional Notes: H+P and labs reviewed, risks and benefits discussed with patient, procedure tolerated well without complications

## 2016-12-23 NOTE — Anesthesia Procedure Notes (Signed)
Anesthesia Regional Block:  Femoral nerve block  Pre-Anesthetic Checklist: ,, timeout performed, Correct Patient, Correct Site, Correct Laterality, Correct Procedure, Correct Position, site marked, Risks and benefits discussed,  Surgical consent,  Pre-op evaluation,  At surgeon's request and post-op pain management  Laterality: Lower and Right  Prep: chloraprep       Needles:  Injection technique: Single-shot  Needle Type: Echogenic Stimulator Needle          Additional Needles:  Procedures: ultrasound guided (picture in chart) and nerve stimulator Femoral nerve block  Nerve Stimulator or Paresthesia:  Response: quad, 0.5 mA,   Additional Responses:   Narrative:  Start time: 12/23/2016 10:44 AM End time: 12/23/2016 10:50 AM Injection made incrementally with aspirations every 5 mL.  Performed by: Personally  Anesthesiologist: Westlyn Glaza  Additional Notes: H+P and labs reviewed, risks and benefits discussed with patient, procedure tolerated well without complications

## 2016-12-23 NOTE — Op Note (Signed)
    Patient name: [name] MRN: [id number] DOB: Aug 09, 1928 Sex: female  12/11/2016 - 12/23/2016 Pre-operative Diagnosis: Ischemic right foot Post-operative diagnosis:  Same Surgeon:  [name] Assistants:  Silva Bandy Procedure:   Right below-knee amputation Anesthesia:  Gen., femoral nerve block Blood Loss:  See anesthesia record Specimens:  Right leg  Findings:  Viable tissue at the level of the amputation  Indications:  The patient has previously undergone a transmetatarsal amputation for a nonhealing wound on her right great toe which she was unable to tolerate the level of pain.  Unfortunately this has not healed.  She comes in today for more proximal amputation.  Procedure:  The patient was identified in the holding area and taken to [facility]  The patient was then placed supine on the table. regional anesthesia was administered.  The patient was prepped and draped in the usual sterile fashion.  A time out was called and antibiotics were administered.  A site was selected 12 cm below the tibial tuberosity.  I created a two thirds, one third posterior flap.  Once the incision was made, the patient had pain on the medial side of her leg and therefore she was converted to LMA anesthesia.  Cautery was used to divide the subcutaneous tissue down to the fascia which was then opened with cautery.  The tibia was circumferentially exposed as was the fibula.  A periosteal elevator was used to elevate the periosteum.  I divided the anterior tibial neurovascular bundle and divided.  I then used a Gigli saw to transect the tibia, beveling the anterior surface.  Large bone cutters were then used to transect the fibula, proximal to the cut edge of the tibia.  Next the neurovascular bundle on the medial leg was identified and isolated.  A 0 silk tie was placed proximally and distally and the bundle was transected.  I then used the amputation knife to divide the remaining muscle.  The leg was  removed as a specimen.  I then mobilized the anterior tibial neurovascular complex as well as the femoral popliteal neurovascular complex so that they could be ligated proximal to the cut edge of the tibia, which there were Doppler 0 silk ties.  The wound was then copiously irrigated.  Hemostasis was then achieved.  All tissue appeared to be very viable and pink.  I reapproximated the fascia with interrupted 3-0 Vicryl.  The skin was then closed with running 4-0 Vicryl and Dermabond.  Sterile dressings were applied.   Disposition:  To PACU in stable condition.   Theotis Burrow, M.D. [facility] Office: [phone] Pager:  [phone]

## 2016-12-23 NOTE — Progress Notes (Signed)
PT Cancellation Note  Patient Details Name: [name] MRN: [id number] DOB: [birth year]   Cancelled Treatment:    Reason Eval/Treat Not Completed: Patient at procedure or test/unavailable. Pt going for R BKA today. PT to hold today and return s/p surgery as able as appropriate.   Mylisa Brunson M Rehan Holness 12/23/2016, 8:42 AM   Kittie Plater, PT, DPT Pager #: [phone] Office #: [phone]

## 2016-12-23 NOTE — Anesthesia Postprocedure Evaluation (Addendum)
Anesthesia Post Note  Patient: [name]  Procedure(s) Performed: Procedure(s) (LRB): AMPUTATION BELOW KNEE (Right)  Patient location during evaluation: PACU Anesthesia Type: Regional Level of consciousness: awake and alert Pain management: pain level controlled Vital Signs Assessment: post-procedure vital signs reviewed and stable Respiratory status: spontaneous breathing, respiratory function stable and patient connected to nasal cannula oxygen Cardiovascular status: blood pressure returned to baseline and stable Postop Assessment: no signs of nausea or vomiting Anesthetic complications: no       Last Vitals:  Vitals:   12/23/16 1343 12/23/16 1350  BP:  (!) 134/57  Pulse:  85  Resp: 18 18  Temp:  36.5 C    Last Pain:  Vitals:   12/23/16 1350  TempSrc: Oral  PainSc:                  Seriah Brotzman

## 2016-12-23 NOTE — Anesthesia Procedure Notes (Signed)
Procedure Name: LMA Insertion Date/Time: 12/23/2016 11:34 AM Performed by: Lance Coon Pre-anesthesia Checklist: Patient identified, Emergency Drugs available, Suction available, Patient being monitored and Timeout performed Patient Re-evaluated:Patient Re-evaluated prior to inductionOxygen Delivery Method: Circle system utilized Preoxygenation: Pre-oxygenation with 100% oxygen Intubation Type: IV induction LMA: LMA inserted LMA Size: 4.0 Number of attempts: 1 Placement Confirmation: positive ETCO2 and breath sounds checked- equal and bilateral Tube secured with: Tape Dental Injury: Teeth and Oropharynx as per pre-operative assessment

## 2016-12-23 NOTE — Anesthesia Preprocedure Evaluation (Signed)
Anesthesia Evaluation  Patient identified by MRN, date of birth, ID band Patient awake    Reviewed: Allergy & Precautions, NPO status , Patient's Chart, lab work & pertinent test results  History of Anesthesia Complications (+) PONV and history of anesthetic complications  Airway Mallampati: II  TM Distance: >3 FB Neck ROM: Full    Dental  (+) Edentulous Upper, Partial Lower   Pulmonary former smoker,    breath sounds clear to auscultation       Cardiovascular hypertension, Pt. on medications + angina + Peripheral Vascular Disease and +CHF   Rhythm:Regular Rate:Normal     Neuro/Psych Anxiety TIACVA    GI/Hepatic Neg liver ROS, hiatal hernia, GERD  ,  Endo/Other  negative endocrine ROS  Renal/GU      Musculoskeletal  (+) Arthritis , Fibromyalgia -  Abdominal   Peds  Hematology  (+) anemia ,   Anesthesia Other Findings   Reproductive/Obstetrics                             Anesthesia Physical Anesthesia Plan  ASA: III  Anesthesia Plan: MAC and Regional   Post-op Pain Management:    Induction: Intravenous  Airway Management Planned: Natural Airway, Nasal Cannula and Simple Face Mask  Additional Equipment: None  Intra-op Plan:   Post-operative Plan:   Informed Consent: I have reviewed the patients History and Physical, chart, labs and discussed the procedure including the risks, benefits and alternatives for the proposed anesthesia with the patient or authorized representative who has indicated his/her understanding and acceptance.   Dental advisory given  Plan Discussed with: CRNA and Surgeon  Anesthesia Plan Comments:         Anesthesia Quick Evaluation

## 2016-12-23 NOTE — Anesthesia Procedure Notes (Signed)
Procedure Name: MAC Date/Time: 12/23/2016 11:05 AM Performed by: Lance Coon Pre-anesthesia Checklist: Patient identified, Emergency Drugs available, Patient being monitored, Timeout performed and Suction available Patient Re-evaluated:Patient Re-evaluated prior to inductionOxygen Delivery Method: Nasal cannula

## 2016-12-23 NOTE — Care Management Important Message (Signed)
Important Message  Patient Details  Name: [name] MRN: [id number] Date of Birth: May 11, 1928   Medicare Important Message Given:  Yes    Nathen May 12/23/2016, 12:47 PM

## 2016-12-23 NOTE — Progress Notes (Signed)
No charge note  Patient was in the OR earlier today, she underwent a R BKA for ischemic R foot.  Plan to continue dilaudid PCA for pain control post op, we will titrate it based on her needs Note plan for potential inpatient rehab admit.  We will continue to follow along for symptom management.   Loistine Chance MD [facility] health palliative medicine team [phone]

## 2016-12-23 NOTE — Progress Notes (Signed)
Rehab admissions - Noted patient to undergo a R BKA today.  In my absence tomorrow, my partner Danne Baxter will cover and can be reached at [phone].  We will continue to follow progress for potential inpatient rehab admission.

## 2016-12-23 NOTE — Progress Notes (Signed)
Progress Note  Patient Name: [name] Date of Encounter: 12/23/2016  Primary Cardiologist: Dr. Angelena Form  Subjective   Overall belly feels less swollen. Quiet night. No SOB. No chest pain. Going for surgery today.  Inpatient Medications    Scheduled Meds: .  ceFAZolin (ANCEF) IV  1 g Intravenous To SS-Surg  . ceFEPime (MAXIPIME) IV  1 g Intravenous Q24H  . clopidogrel  75 mg Oral Daily  . enoxaparin (LOVENOX) injection  30 mg Subcutaneous Q24H  . feeding supplement (ENSURE ENLIVE)  237 mL Oral BID BM  . gabapentin  600 mg Oral TID  . hydrochlorothiazide  6.25 mg Oral Daily  . HYDROmorphone   Intravenous Q4H  . irbesartan  75 mg Oral Daily  . metoprolol  50 mg Oral Daily  . metoprolol  25 mg Oral QHS  . multivitamin with minerals  1 tablet Oral Daily  . pantoprazole  40 mg Oral Daily  . senna  1 tablet Oral BID   Continuous Infusions: . sodium chloride 10 mL/hr at 12/19/16 1049  . lactated ringers 50 mL/hr at 12/17/16 1032   PRN Meds: acetaminophen, alum & mag hydroxide-simeth, diazepam, diphenhydrAMINE **OR** diphenhydrAMINE, guaiFENesin-dextromethorphan, hydrALAZINE, HYDROmorphone (DILAUDID) injection, labetalol, loperamide, magnesium sulfate 1 - 4 g bolus IVPB, metoprolol, naloxone **AND** sodium chloride flush, ondansetron, phenol, silver nitrate applicators   Vital Signs    Vitals:   12/22/16 2359 12/23/16 0331 12/23/16 0353 12/23/16 0800  BP:  (!) 158/56    Pulse:  93    Resp: 18 18 16 16   Temp:  98.1 F (36.7 C)    TempSrc:  Oral    SpO2: 96% 100% 97% 95%  Weight:      Height:        Intake/Output Summary (Last 24 hours) at 12/23/16 0855 Last data filed at 12/23/16 0350  Gross per 24 hour  Intake          1007.17 ml  Output              850 ml  Net           157.17 ml   Filed Weights   12/16/16 1900 12/19/16 0615  Weight: 128 lb 4.9 oz (58.2 kg) 127 lb 1.6 oz (57.7 kg)    Telemetry    NSR with occ PACs - personally reviewed  ECG      N/a today  Physical Exam   GEN: No acute distress, elderly WF.  HEENT: Normocephalic, atraumatic, sclera non-icteric. Neck:No JVD or bruits. Cardiac:RRR no murmurs, rubs, or gallops.  Radials/DP/PT 1+ and equal bilaterally.  Respiratory:Diminished BS at bases bilaterally. Breathing is unlabored. NX:[id number], nontender, non-distended, BS +x 4. MS:no deformity. Extremities: No clubbing or cyanosis. No edema. R foot dressed. Distal pedal pulses are 2+ and equal bilaterally. Neuro:AAOx3. Follows commands. Psych:  Responds to questions appropriately with a normal affect.  Labs    Chemistry Recent Labs Lab 12/22/16 0316 12/22/16 1052 12/23/16 0320  NA 136 134* 134*  K 4.2 4.0 4.0  CL 98* 95* 95*  CO2 29 28 29   GLUCOSE 117* 105* 111*  BUN 14 15 14   CREATININE 1.06* 0.95 1.02*  CALCIUM 9.2 9.0 9.1  GFRNONAA 46* 53* 48*  GFRAA 54* >60 56*  ANIONGAP 9 11 10      Hematology Recent Labs Lab 12/20/16 0427 12/21/16 0125 12/23/16 0320  WBC 11.5* 12.7* 12.6*  RBC 2.71* 2.68* 2.84*  HGB 8.0* 7.8* 8.2*  HCT 24.5* 24.2* 26.0*  MCV  90.4 90.3 91.5  MCH 29.5 29.1 28.9  MCHC 32.7 32.2 31.5  RDW 14.9 14.8 14.8  PLT 349 418* 477*    Radiology    No results found.  Cardiac Studies   2D echo 12/19/16 - Left ventricle: The cavity size was normal. There was mild focal basal hypertrophy of the septum. Systolic function was normal. The estimated ejection fraction was in the range of 55% to 60%. Wall motion was normal; there were no regional wall motion abnormalities. - Aortic valve: There was mild to moderate regurgitation. Regurgitation pressure half-time: 440 ms. - Mitral valve: Calcified annulus. There was mild regurgitation. - Left atrium: The atrium was mildly dilated. - Pulmonary arteries: PA peak pressure: 36 mm Hg (S). Impressions: - The right ventricular systolic pressure was increased consistent with mild pulmonary hypertension.  Patient Profile      83 y.o. female with PAD/PVD requiring popliteal artery bypass graft (2010, 2011) and left external iliac and left superficial femoral artery stenting (2016), and right superificial femoral artery stenting (2017); HTN, GI bleeding on eliquis (for Afib), paroxysmal atrial fibrillation, and negative nuc 09/2016 admitted for right transmetatarsal amputation which was performed 12/12/16, with re-expoloration for bleeding on 12/17/16. During admission developed AF RVR in the setting of acute respiratory failure, fever, acute CHF.  Assessment & Plan    1. PAD/ R TMA - per vascular surgery. Pending AKA versus BKA today.  2. Paroxysmal atrial fib - maintaining NSR. Not a candidate for NOAC due to bleeding hx/anemia as above.  3. Acute on chronic diastolic CHF - will ask nurse to weigh patient. I/o's don't appear accurate from yesterday. She is on low dose HCTZ as part of her diovan HCT and has been getting one-time doses of Lasix 20mg  IV daily. Since she is going to be NPO through today, will hold further IV Lasix.   4. Chronic anemia - will need to continue to trend given her Plavix use.  5. HTN - may need further adjustment based on post-op status. Could titrate ARB vs add amlodipine if needed but will not make this change yet since she is going for surgery shortly. In the past her baseline HR has been in the 60s which is why I think her beta blocker dose is somewhat unusual.  Signed, Charlie Pitter, PA-C  12/23/2016, 8:55 AM    I have personally seen and examined this patient with Melina Copa, PA-c. I agree with the assessment and plan as outlined above. She is in sinus. Doing well today. BP stable. Volume status is ok.   Lauree Chandler 12/23/2016 5:46 PM

## 2016-12-24 ENCOUNTER — Encounter (HOSPITAL_COMMUNITY): Payer: Self-pay | Admitting: Surgery

## 2016-12-24 LAB — BASIC METABOLIC PANEL
ANION GAP: 12 (ref 5–15)
BUN: 14 mg/dL (ref 6–20)
CHLORIDE: 94 mmol/L — AB (ref 101–111)
CO2: 28 mmol/L (ref 22–32)
Calcium: 8.7 mg/dL — ABNORMAL LOW (ref 8.9–10.3)
Creatinine, Ser: 0.98 mg/dL (ref 0.44–1.00)
GFR calc Af Amer: 59 mL/min — ABNORMAL LOW (ref >60)
GFR calc non Af Amer: 51 mL/min — ABNORMAL LOW (ref >60)
GLUCOSE: 116 mg/dL — AB (ref 65–99)
POTASSIUM: 4.3 mmol/L (ref 3.5–5.1)
Sodium: 134 mmol/L — ABNORMAL LOW (ref 135–145)

## 2016-12-24 LAB — CULTURE, BLOOD (ROUTINE X 2)
CULTURE: NO GROWTH
CULTURE: NO GROWTH

## 2016-12-24 MED ORDER — HYDROMORPHONE 1 MG/ML IV SOLN
INTRAVENOUS | Status: DC
  Administered 2016-12-24: 0.2 mg via INTRAVENOUS
  Administered 2016-12-24: 2 mg via INTRAVENOUS
  Administered 2016-12-24: 2.2 mg via INTRAVENOUS
  Administered 2016-12-25 (×2): 0.2 mg via INTRAVENOUS
  Administered 2016-12-25: 08:00:00 via INTRAVENOUS
  Filled 2016-12-24: qty 25

## 2016-12-24 NOTE — Progress Notes (Signed)
    Subjective  - POD #1, s/p BKA  C/o pain, especially when moved   Physical Exam:  BKA Dressing clean and dry,        Assessment/Plan:  POD #1  plan dressing change tomorrow Needs to get up in bed  Dodi Leu, [place] 12/24/2016 10:11 AM --  Vitals:   12/24/16 0414 12/24/16 0800  BP: (!) 156/51   Pulse: 89   Resp: (!) 22 16  Temp: 98.4 F (36.9 C)     Intake/Output Summary (Last 24 hours) at 12/24/16 1011 Last data filed at 12/24/16 0708  Gross per 24 hour  Intake             1000 ml  Output              775 ml  Net              225 ml     Laboratory CBC    Component Value Date/Time   WBC 13.1 (H) 12/23/2016 1300   HGB 7.9 (L) 12/23/2016 1300   HCT 25.4 (L) 12/23/2016 1300   PLT 528 (H) 12/23/2016 1300    BMET    Component Value Date/Time   NA 134 (L) 12/24/2016 0414   K 4.3 12/24/2016 0414   CL 94 (L) 12/24/2016 0414   CO2 28 12/24/2016 0414   GLUCOSE 116 (H) 12/24/2016 0414   BUN 14 12/24/2016 0414   CREATININE 0.98 12/24/2016 0414   CALCIUM 8.7 (L) 12/24/2016 0414   GFRNONAA 51 (L) 12/24/2016 0414   GFRAA 59 (L) 12/24/2016 0414    COAG Lab Results  Component Value Date   INR 1.14 12/23/2016   INR 1.00 10/23/2016   INR 1.01 09/24/2016   No results found for: PTT  Antibiotics Anti-infectives    Start     Dose/Rate Route Frequency Ordered Stop   12/23/16 0700  ceFAZolin (ANCEF) IVPB 1 g/50 mL premix    Comments:  Send with pt to OR   1 g 100 mL/hr over 30 Minutes Intravenous To [facility]Stay [facility] 12/23/16 0537 12/23/16 1118   12/19/16 1000  ceFEPIme (MAXIPIME) 1 g in dextrose 5 % 50 mL IVPB     1 g 100 mL/hr over 30 Minutes Intravenous Every 24 hours 12/19/16 0858     12/16/16 2000  cefUROXime (ZINACEF) 750 mg in dextrose 5 % 50 mL IVPB  Status:  Discontinued     750 mg 100 mL/hr over 30 Minutes Intravenous Every 8 hours 12/16/16 1855 12/19/16 0857   12/14/16 0800  cephALEXin (KEFLEX) capsule 250 mg  Status:  Discontinued     250  mg Oral Every 8 hours 12/14/16 0717 12/14/16 0722   12/14/16 0800  cephALEXin (KEFLEX) capsule 500 mg  Status:  Discontinued     500 mg Oral Every 8 hours 12/14/16 0722 12/16/16 1913   12/11/16 2000  cefUROXime (ZINACEF) 1.5 g in dextrose 5 % 50 mL IVPB     1.5 g 100 mL/hr over 30 Minutes Intravenous Every 12 hours 12/11/16 1217 12/12/16 0830   12/11/16 0728  cefUROXime (ZINACEF) 1.5 g in dextrose 5 % 50 mL IVPB     1.5 g 100 mL/hr over 30 Minutes Intravenous 30 min pre-op 12/11/16 0728 12/11/16 0935       V. Leia Alf, M.D. [facility] Office: [phone] Pager:  [phone]

## 2016-12-24 NOTE — Evaluation (Signed)
Physical Therapy Evaluation Patient Details Name: [name] MRN: [id number] DOB: 04-28-1928 Today's Date: 12/24/2016   History of Present Illness  Pt is an 83 y.o. female s/p R BKA on 2/6. PMHx: Anginal pain, Arthritis, Chronic lower back pain, DVT, Fibromyalgia, GERD, High cholesterol, HTN, Macular degeneration, PVD, TIA.  Clinical Impression  Pt very sleepy from PCA pump however did not complain of pain. Pt requiring maxA for all mobility. Recommend CIR upon d/c to advance mobility to supervision w/c level as family can provide 24/7 supervision upon d/c.    Follow Up Recommendations CIR;Supervision/Assistance - 24 hour    Equipment Recommendations  None recommended by PT    Recommendations for Other Services Rehab consult     Precautions / Restrictions Precautions Precautions: Fall;Other (comment) Precaution Comments: daughter reports pt not to push or pull with Lt elbow more than 5 lbs (s/p total elbow replacement)  Restrictions Weight Bearing Restrictions: Yes RLE Weight Bearing: Non weight bearing      Mobility  Bed Mobility Overal bed mobility: Needs Assistance Bed Mobility: Supine to Sit     Supine to sit: Max assist;+2 for physical assistance     General bed mobility comments: Assist for LEs to EOB and trunk elevation to sitting. Assist to scoot hips out to EOB. Pt with posterior lean throughout despite cues for anterior weight shift.  Transfers Overall transfer level: Needs assistance Equipment used:  (2 person lift with gait belt and bed pad) Transfers: Sit to/from Stand;Stand Pivot Transfers Sit to Stand: Max assist;+2 physical assistance Stand pivot transfers: Max assist;+2 physical assistance       General transfer comment: With use of bed pad and gait belt. Cues for hand placement and sequencing of transfer  Ambulation/Gait             General Gait Details: unable  Stairs            Wheelchair Mobility    Modified Rankin  (Stroke Patients Only)       Balance Overall balance assessment: Needs assistance Sitting-balance support: Feet supported;Bilateral upper extremity supported Sitting balance-Leahy Scale: Poor   Postural control: Posterior lean Standing balance support: Bilateral upper extremity supported Standing balance-Leahy Scale: Zero                               Pertinent Vitals/Pain Pain Assessment: Faces Faces Pain Scale: Hurts a little bit Pain Location: RLE Pain Descriptors / Indicators:  (pt sleepy from PCA pump) Pain Intervention(s): Monitored during session    Home Living Family/patient expects to be discharged to:: Inpatient rehab                 Additional Comments: pt family is planning for 24/7 supervision    Prior Function Level of Independence: Needs assistance   Gait / Transfers Assistance Needed: RW with some family help recently  ADL's / Homemaking Assistance Needed: daughter assists with shower transfers and ADL as needed        Hand Dominance   Dominant Hand: Right    Extremity/Trunk Assessment   Upper Extremity Assessment Upper Extremity Assessment:  (see initial eval, pt with bilat limited function)    Lower Extremity Assessment Lower Extremity Assessment: LLE deficits/detail LLE Deficits / Details: grossly 3-/5    Cervical / Trunk Assessment Cervical / Trunk Assessment: Kyphotic  Communication   Communication: No difficulties  Cognition Arousal/Alertness: Lethargic;Suspect due to medications Behavior During Therapy: Flat affect;Anxious  General Comments: Mildly confused at times. Has been using PCA and IV pain medication today.    General Comments General comments (skin integrity, edema, etc.): spoke extensively with daughter regarding rehab potential and d/c plans    Exercises     Assessment/Plan    PT Assessment Patient needs continued PT services  PT Problem List Decreased strength;Decreased  range of motion;Decreased activity tolerance;Decreased balance;Decreased mobility;Decreased knowledge of use of DME;Pain;Decreased skin integrity          PT Treatment Interventions DME instruction;Gait training;Functional mobility training;Therapeutic activities;Therapeutic exercise;Balance training;Neuromuscular re-education;Patient/family education    PT Goals (Current goals can be found in the Care Plan section)  Acute Rehab PT Goals Patient Stated Goal: none stated PT Goal Formulation: With patient/family Time For Goal Achievement: 01/07/17 Potential to Achieve Goals: Fair    Frequency Min 3X/week   Barriers to discharge        Co-evaluation PT/OT/SLP Co-Evaluation/Treatment: Yes Reason for Co-Treatment: For patient/therapist safety   OT goals addressed during session: ADL's and self-care       End of Session Equipment Utilized During Treatment: Gait belt Activity Tolerance: Patient limited by pain;Other (comment) Patient left: in chair;with call bell/phone within reach;with family/visitor present Nurse Communication: Mobility status;Need for lift equipment         Time: 1444-1526 PT Time Calculation (min) (ACUTE ONLY): 42 min   Charges:   PT Evaluation $PT Re-evaluation: 1 Procedure PT Treatments $Therapeutic Activity: 8-22 mins   PT G Codes:        Dontavious Emily M Eiley Mcginnity 12/24/2016, 4:42 PM  Kittie Plater, PT, DPT Pager #: [phone] Office #: [phone]

## 2016-12-24 NOTE — Progress Notes (Signed)
Clinical Social Worker met patient at bedside to offer information on SNF placement. Patients son, husband and other family members were in the room. CSW handed son a list of facility that has made a bed offer on patient behalf. Patient sons stated that his sister will be back later on today to look over facility and that she is familiar with most facility in the area. CSW remains available for support and discharge needs.   Rhea Pink, MSW,  [facility]

## 2016-12-24 NOTE — Progress Notes (Signed)
Daily Progress Note   Patient Name: [name]       Date: 12/24/2016 DOB: 09-12-1928  Age: 83 y.o. MRN#: [id number] Attending Physician: Serafina Mitchell, MD Primary Care Physician: No primary care provider on file. Admit Date: 12/11/2016  Reason for Consultation/Follow-up: Pain control  Subjective:  awake, some what confused per daughter Had only few bites of breakfast Denies nausea Pain is not more than 4-5/10 at present.  Daughter awaiting inpatient rehab coordinator visit. Post op day 1 s/p R BKA for ischemic R foot.  Last bowel movement was 3 days ago. Continue to monitor, patient reportedly has IBS.    Length of Stay: 13  Current Medications: Scheduled Meds:  . ceFEPime (MAXIPIME) IV  1 g Intravenous Q24H  . clopidogrel  75 mg Oral Daily  . enoxaparin (LOVENOX) injection  30 mg Subcutaneous Q24H  . feeding supplement (ENSURE ENLIVE)  237 mL Oral BID BM  . gabapentin  600 mg Oral TID  . hydrochlorothiazide  6.25 mg Oral Daily  . HYDROmorphone   Intravenous Q4H  . irbesartan  75 mg Oral Daily  . metoprolol  50 mg Oral Daily  . metoprolol  25 mg Oral QHS  . multivitamin with minerals  1 tablet Oral Daily  . pantoprazole  40 mg Oral Daily  . senna  1 tablet Oral BID    Continuous Infusions: . sodium chloride Stopped (12/23/16 0927)  . lactated ringers 50 mL/hr at 12/17/16 1032  . lactated ringers Stopped (12/23/16 1234)    PRN Meds: acetaminophen, alum & mag hydroxide-simeth, diazepam, diphenhydrAMINE **OR** diphenhydrAMINE, guaiFENesin-dextromethorphan, hydrALAZINE, HYDROmorphone (DILAUDID) injection, labetalol, loperamide, magnesium sulfate 1 - 4 g bolus IVPB, metoprolol, naloxone **AND** sodium chloride flush, ondansetron, phenol, silver nitrate  applicators  Physical Exam         Elderly WF in no distress Awake resting in bed S1 S2 S.p RLE amputation.   Vital Signs: BP (!) 156/51 (BP Location: Right Arm)   Pulse 89   Temp 98.4 F (36.9 C) (Oral)   Resp 16   Ht 5\' 2"  (1.575 m)   Wt 58.8 kg (129 lb 9.9 oz)   SpO2 92%   BMI 23.71 kg/m  SpO2: SpO2: 92 % O2 Device: O2 Device: Nasal Cannula O2 Flow Rate: O2 Flow Rate (L/min): 2 L/min  Intake/output summary:  Intake/Output Summary (Last 24 hours) at 12/24/16 1023 Last data filed at 12/24/16 [id number]  Gross per 24 hour  Intake             1000 ml  Output              775 ml  Net              225 ml   LBM: Last BM Date: 12/21/16 Baseline Weight: Weight: 58.2 kg (128 lb 4.9 oz) Most recent weight: Weight: 58.8 kg (129 lb 9.9 oz)       Palliative Assessment/Data:    Flowsheet Rows   Flowsheet Row Most Recent Value  Intake Tab  Referral Department  Surgery  Unit at Time of Referral  Med/Surg Unit  Palliative Care Primary Diagnosis  Other (Comment)  Date Notified  12/19/16  Palliative Care Type  Return patient Palliative Care  Reason for referral  Pain  Date of Admission  12/11/16  Date first seen by Palliative Care  12/20/16  # of days Palliative referral response time  1 Day(s)  # of days IP prior to Palliative referral  8  Clinical Assessment  Palliative Performance Scale Score  30%  Pain Max last 24 hours  6  Pain Min Last 24 hours  4  Psychosocial & Spiritual Assessment  Palliative Care Outcomes  Patient/Family meeting held?  Yes  Who was at the meeting?  daughter and patient.   Palliative Care Outcomes  Improved pain interventions      Patient Active Problem List   Diagnosis Date Noted  . Acute on chronic combined systolic and diastolic CHF (congestive heart failure) ([place]) 12/22/2016  . Anemia 12/22/2016  . Palliative care encounter   . Lower extremity pain, right   . Acute GI bleeding 10/23/2016  . Paroxysmal atrial fibrillation ([place]) 10/03/2016   . 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 PAD (peripheral artery disease) ([place]) 09/05/2016  . Groin pain 04/02/2015  . Aftercare following surgery of the circulatory system, [place] 12/13/2013  . Shingles 10/26/2013  . Anxiety 10/26/2013  . Acute blood loss anemia 09/05/2013  . Elbow fracture, left 09/05/2013  . Left acetabular fracture ([place]) 09/05/2013  . Ankle fracture, left 09/05/2013  . HTN (hypertension) 09/03/2013  . HLD (hyperlipidemia) 09/03/2013  . Peripheral vascular disease, unspecified 05/10/2012  . Chronic total occlusion of artery of the extremities ([facility]) 01/19/2012    Palliative Care Assessment & Plan   Patient Profile:    Assessment:  PAD s/p R BKA Pain  Constipation  Recommendations/Plan:   Adjust Dilaudid PCA, patient was on hydrocodone PO Q 4 hours round the clock, which was not helping with the pain. We discussed about the patient's current pain regimen, as we decrease PCA, likely options include trans dermal fentanyl patch and PO PRN Oxy IR based on patient's pain needs.   Continue bowel regimen.   Await rehab input   Code Status:    Code Status Orders        Start     Ordered   12/20/16 1114  Limited resuscitation (code)  Continuous    Question Answer Comment  In the event of cardiac or respiratory ARREST: Initiate Code Blue, Call Rapid Response Yes   In the event of cardiac or respiratory ARREST: Perform CPR No   In the event of cardiac or respiratory ARREST: Perform Intubation/Mechanical Ventilation No   In the event of  cardiac or respiratory ARREST: Use NIPPV/BiPAp only if indicated Yes   In the event of cardiac or respiratory ARREST: Administer ACLS medications if indicated Yes   In the event of cardiac or respiratory ARREST: Perform Defibrillation or Cardioversion if indicated No      12/20/16 1121    Code Status History    Date Active Date Inactive Code Status Order ID  Comments User Context   12/11/2016  1:14 PM 12/20/2016 11:21 AM Full Code [id number]  Ulyses Amor, PA-C Inpatient   10/23/2016  7:46 PM 10/26/2016  2:19 PM Full Code [id number]  Vianne Bulls, MD Inpatient   09/24/2016  8:49 PM 10/03/2016 12:12 PM Full Code [id number]  Alvia Grove, PA-C Inpatient   09/09/2016  1:05 PM 09/09/2016  9:49 PM Full Code [id number]  Serafina Mitchell, MD Inpatient   09/05/2016  7:09 PM 09/06/2016  2:11 PM Full Code [id number]  Serafina Mitchell, MD Inpatient   12/26/2014  3:13 PM 12/26/2014  9:56 PM Full Code [id number]  Serafina Mitchell, MD Inpatient   09/01/2013 10:28 PM 09/08/2013  6:36 PM Full Code [id number]  [place], PA-C Inpatient    Advance Directive Documentation   Flowsheet Row Most Recent Value  Type of Advance Directive  Healthcare Power of Attorney, Living will  Pre-existing out of facility DNR order (yellow form or pink MOST form)  No data  "MOST" Form in Place?  No data       Prognosis:   Unable to determine  Discharge Planning:  To Be Determined  Care plan was discussed with  Patient, daughter.   Thank you for allowing the Palliative Medicine Team to assist in the care of this patient.   Time In: 10 Time Out: 10.25 Total Time 25 Prolonged Time Billed  no       Greater than 50%  of this time was spent counseling and coordinating care related to the above assessment and plan.  Loistine Chance, MD [phone]  Please contact Palliative Medicine Team phone at [phone] for questions and concerns.

## 2016-12-24 NOTE — Progress Notes (Signed)
Patient sleeping, family at bedside. Call light within reach

## 2016-12-24 NOTE — Progress Notes (Signed)
Occupational Therapy Treatment Patient Details Name: [name] MRN: [id number] DOB: November 24, 1927 Today's Date: 12/24/2016    History of present illness Pt is an 83 y.o. female s/p R BKA on 2/6. PMHx: Anginal pain, Arthritis, Chronic lower back pain, DVT, Fibromyalgia, GERD, High cholesterol, HTN, Macular degeneration, PVD, TIA.   OT comments  Pt with better pain control today but mild confusion (?pain meds) and appears to be anxious regarding movement and transfers. Pt able to perform stand pivot transfer to chair with max assist +2 and cues throughout for sequencing and safety. Spoke with daughter regarding follow up and she would like to continue to pursue CIR for post acute rehab. Feel pt would still be a good candidate for CIR as she was independent PTA and has good family support pending pain control and medication management. Will continue to follow acutely.   Follow Up Recommendations  CIR;Supervision/Assistance - 24 hour    Equipment Recommendations  Other (comment) (TBD at next venue)    Recommendations for Other Services      Precautions / Restrictions Precautions Precautions: Fall;Other (comment) Precaution Comments: daughter reports pt not to push or pull with Lt elbow more than 5 lbs (s/p total elbow replacement)  Restrictions Weight Bearing Restrictions: Yes RLE Weight Bearing: Non weight bearing       Mobility Bed Mobility Overal bed mobility: Needs Assistance Bed Mobility: Supine to Sit     Supine to sit: Max assist;+2 for physical assistance     General bed mobility comments: Assist for LEs to EOB and trunk elevation to sitting. Assist to scoot hips out to EOB. Pt with posterior lean throughout despite cues for anterior weight shift.  Transfers Overall transfer level: Needs assistance Equipment used: 2 person hand held assist Transfers: Sit to/from [facility] Sit to Stand: Max assist;+2 physical assistance Stand pivot transfers: Max  assist;+2 physical assistance       General transfer comment: With use of bed pad and gait belt. Cues for hand placement and sequencing of transfer    Balance Overall balance assessment: Needs assistance Sitting-balance support: Feet supported;Bilateral upper extremity supported Sitting balance-Leahy Scale: Poor   Postural control: Posterior lean Standing balance support: Bilateral upper extremity supported Standing balance-Leahy Scale: Zero                     ADL Overall ADL's : Needs assistance/impaired                     Lower Body Dressing: Total assistance Lower Body Dressing Details (indicate cue type and reason): to don L sock at bed level Toilet Transfer: Maximal assistance;+2 for physical assistance;Stand-pivot Toilet Transfer Details (indicate cue type and reason): Simulated by stand pivot to chair         Functional mobility during ADLs: Maximal assistance;+2 for physical assistance (for stand pivot only)        Vision                     Perception     Praxis      Cognition   Behavior During Therapy: Flat affect;Anxious                    General Comments: Mildly confused at times. Has been using PCA and IV pain medication today.    Extremity/Trunk Assessment               Exercises     Shoulder  Instructions       General Comments      Pertinent Vitals/ Pain       Pain Assessment: Faces Faces Pain Scale: Hurts little more Pain Location: RLE Pain Descriptors / Indicators: Sore Pain Intervention(s): Monitored during session;Repositioned;Premedicated before session;Limited activity within patient's tolerance;PCA encouraged  Home Living                                          Prior Functioning/Environment              Frequency  Min 2X/week        Progress Toward Goals  OT Goals(current goals can now be found in the care plan section)  Progress towards OT goals:  Progressing toward goals  Acute Rehab OT Goals Patient Stated Goal: none stated OT Goal Formulation: With patient/family  Plan Discharge plan needs to be updated    Co-evaluation    PT/OT/SLP Co-Evaluation/Treatment: Yes Reason for Co-Treatment: For patient/therapist safety;To address functional/ADL transfers   OT goals addressed during session: ADL's and self-care      End of Session Equipment Utilized During Treatment: Gait belt;Oxygen   Activity Tolerance Patient tolerated treatment well   Patient Left in chair;with call bell/phone within reach;with family/visitor present   Nurse Communication Mobility status        Time: [id number] OT Time Calculation (min): 40 min  Charges: OT General Charges $OT Visit: 1 Procedure OT Treatments $Therapeutic Activity: 8-22 mins  Binnie Kand M.S., OTR/L Pager: [phone]  12/24/2016, 4:09 PM

## 2016-12-25 LAB — CREATININE, SERUM
CREATININE: 0.97 mg/dL (ref 0.44–1.00)
GFR calc Af Amer: 60 mL/min — ABNORMAL LOW (ref >60)
GFR calc non Af Amer: 51 mL/min — ABNORMAL LOW (ref >60)

## 2016-12-25 MED ORDER — OXYCODONE HCL 5 MG PO TABS
5.0000 mg | ORAL_TABLET | ORAL | Status: DC | PRN
  Administered 2016-12-25 – 2016-12-30 (×25): 5 mg via ORAL
  Filled 2016-12-25 (×28): qty 1

## 2016-12-25 MED ORDER — FENTANYL 12 MCG/HR TD PT72
12.5000 ug | MEDICATED_PATCH | TRANSDERMAL | Status: DC
  Administered 2016-12-25 – 2016-12-28 (×2): 12.5 ug via TRANSDERMAL
  Filled 2016-12-25 (×3): qty 1

## 2016-12-25 MED ORDER — BISACODYL 10 MG RE SUPP: 10.0000 mg | Freq: Every day | RECTAL | Status: DC | PRN

## 2016-12-25 NOTE — Progress Notes (Signed)
Spoke with daughter about pushing the PCA pump for her mother. Patient very groggy earlier tonight,tried twice to get her to wake up long enough to take her 2200 medications. Educated her that it takes elderly people's systems longer to process dilaudid. Still quite a few demands, even though every time I checked on the patient she was asleep. Will continue to monitor, call light within reach.

## 2016-12-25 NOTE — Progress Notes (Addendum)
Daily Progress Note   Patient Name: [name]       Date: 12/25/2016 DOB: 12/20/1927  Age: 83 y.o. MRN#: [id number] Attending Physician: Serafina Mitchell, MD Primary Care Physician: No primary care provider on file. Admit Date: 12/11/2016  Reason for Consultation/Follow-up: Pain control  Subjective:  awake, alert resting in bed Husband, 2 sons and daughter Su Grand are all present.  Overnight events and events this am noted: patient had severe pain while being placed on bed pan, had severe pain in both legs throughout the night as well as this am. Patient has discussed with her family about "giving up."  See discussions below  Post op day 2 s/p R BKA for ischemic R foot.   no bowel movement yet.  Not eating well, had few bites for breakfast.    Length of Stay: 14  Current Medications: Scheduled Meds:  . ceFEPime (MAXIPIME) IV  1 g Intravenous Q24H  . clopidogrel  75 mg Oral Daily  . enoxaparin (LOVENOX) injection  30 mg Subcutaneous Q24H  . feeding supplement (ENSURE ENLIVE)  237 mL Oral BID BM  . fentaNYL  12.5 mcg Transdermal Q72H  . gabapentin  600 mg Oral TID  . hydrochlorothiazide  6.25 mg Oral Daily  . irbesartan  75 mg Oral Daily  . metoprolol  50 mg Oral Daily  . metoprolol  25 mg Oral QHS  . multivitamin with minerals  1 tablet Oral Daily  . pantoprazole  40 mg Oral Daily  . senna  1 tablet Oral BID    Continuous Infusions: . sodium chloride Stopped (12/23/16 0927)  . lactated ringers 50 mL/hr at 12/17/16 1032  . lactated ringers Stopped (12/23/16 1234)    PRN Meds: acetaminophen, alum & mag hydroxide-simeth, bisacodyl, diazepam, guaiFENesin-dextromethorphan, hydrALAZINE, HYDROmorphone (DILAUDID) injection, labetalol, loperamide, magnesium sulfate 1 - 4 g  bolus IVPB, metoprolol, ondansetron, oxyCODONE, phenol, silver nitrate applicators  Physical Exam         Elderly WF in no distress Awake resting in bed S1 S2 S.p RLE amputation.   Vital Signs: BP (!) 173/55 (BP Location: Right Arm)   Pulse 87   Temp 97.9 F (36.6 C) (Axillary)   Resp (!) 21   Ht _0  (1.575 m)   Wt 58.8 kg (129 lb 9.9 oz)  SpO2 95%   BMI 23.71 kg/m  SpO2: SpO2: 95 % O2 Device: O2 Device: Nasal Cannula O2 Flow Rate: O2 Flow Rate (L/min): 2 L/min  Intake/output summary:   Intake/Output Summary (Last 24 hours) at 12/25/16 1018 Last data filed at 12/25/16 0522  Gross per 24 hour  Intake              100 ml  Output             1050 ml  Net             -950 ml   LBM: Last BM Date: 12/22/16 Baseline Weight: Weight: 58.2 kg (128 lb 4.9 oz) Most recent weight: Weight: 58.8 kg (129 lb 9.9 oz)       Palliative Assessment/Data:    Flowsheet Rows   Flowsheet Row Most Recent Value  Intake Tab  Referral Department  Surgery  Unit at Time of Referral  Med/Surg Unit  Palliative Care Primary Diagnosis  Other (Comment)  Date Notified  12/19/16  Palliative Care Type  Return patient Palliative Care  Reason for referral  Pain  Date of Admission  12/11/16  Date first seen by Palliative Care  12/20/16  # of days Palliative referral response time  1 Day(s)  # of days IP prior to Palliative referral  8  Clinical Assessment  Palliative Performance Scale Score  30%  Pain Max last 24 hours  6  Pain Min Last 24 hours  4  Psychosocial & Spiritual Assessment  Palliative Care Outcomes  Patient/Family meeting held?  Yes  Who was at the meeting?  daughter and patient.   Palliative Care Outcomes  Improved pain interventions      Patient Active Problem List   Diagnosis Date Noted  . Acute on chronic combined systolic and diastolic CHF (congestive heart failure) ([place]) 12/22/2016  . Anemia 12/22/2016  . Palliative care encounter   . Lower extremity pain, right   .  Acute GI bleeding 10/23/2016  . Paroxysmal atrial fibrillation ([place]) 10/03/2016  . 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 PAD (peripheral artery disease) ([place]) 09/05/2016  . Groin pain 04/02/2015  . Aftercare following surgery of the circulatory system, [place] 12/13/2013  . Shingles 10/26/2013  . Anxiety 10/26/2013  . Acute blood loss anemia 09/05/2013  . Elbow fracture, left 09/05/2013  . Left acetabular fracture ([place]) 09/05/2013  . Ankle fracture, left 09/05/2013  . HTN (hypertension) 09/03/2013  . HLD (hyperlipidemia) 09/03/2013  . Peripheral vascular disease, unspecified 05/10/2012  . Chronic total occlusion of artery of the extremities ([facility]) 01/19/2012    Palliative Care Assessment & Plan   Patient Profile:    Assessment:  PAD s/p R BKA Pain: total pain, has physical, neuropathic and emotional components.  Constipation Existential suffering Not eating well   Recommendations/Plan:    Family meeting:  I met with the patient, husband, daughter [name] who is an Therapist, sports, 2 sons in the room. The patient was living at home with her husband in [place] prior to this admit. She has 3 children. She has had life long pain in both legs, her grand children knew they could not just jump on her legs.   Patient states she is tired. She feels like the burden of her pain and her overall health in general is too great. We discussed about hospice philosophy of care, that hospice would be an extra layer of support at home, SNF or  residential hospice.   Alternatively, we also discussed about rehab, physical therapy, SNF efforts to rehabilitate.   We discussed about appropriate pain management. Patient is on Dilaudid PCA bolus only. She has at times reportedly been too groggy, at times in severe, uncontrolled pain. At home, she was taking PO hydrocodone Q 4 hours round the clock with no benefit.   We discussed the concept of  total pain. The patient has a physical component to her pain, but possibly also a very strong neuropathic and emotional aspect to her total pain. She is on neurontin. We discussed about D/C Dilaudid PCA, about starting low dose trans dermal fentanyl, PO PRN Oxy IR, while still having PRN IV Dilaudid available for breakthrough pain.   We will adjust the bowel regimen. Family is asking for a trapeze over the bed so that the patient's legs will not have to be moved so much, when she is placed on the bed pan. Other immediate concerns also addressed, to the best of my ability, daughter thinks the patient's urine is becoming more dark and concentrated. She has been reassured, that the patient's serum creatinine is normal, how ever, she is not eating drinking well. We will continue to monitor this.   During the family meeting, the patient's daughter [name] and 2 sons implored their mother to "keep fighting". They wish for her to be comfortable, have her pain controlled, but their goals are not comfort-only at this time. They wish for the patient to ear more, to participate in therapies and to focus on rehab efforts rather than a hospice/comfort based approach to her care at this time. For the sake of her children, patient wishes to continue current mode of care for now.   We will continue to follow along and help guide appropriate decision making, along with appropriate symptom management.    Code Status:    Code Status Orders        Start     Ordered   12/20/16 1114  Limited resuscitation (code)  Continuous    Question Answer Comment  In the event of cardiac or respiratory ARREST: Initiate Code Blue, Call Rapid Response Yes   In the event of cardiac or respiratory ARREST: Perform CPR No   In the event of cardiac or respiratory ARREST: Perform Intubation/Mechanical Ventilation No   In the event of cardiac or respiratory ARREST: Use NIPPV/BiPAp only if indicated Yes   In the event of cardiac or  respiratory ARREST: Administer ACLS medications if indicated Yes   In the event of cardiac or respiratory ARREST: Perform Defibrillation or Cardioversion if indicated No      12/20/16 1121    Code Status History    Date Active Date Inactive Code Status Order ID Comments User Context   12/11/2016  1:14 PM 12/20/2016 11:21 AM Full Code [id number]  Ulyses Amor, PA-C Inpatient   10/23/2016  7:46 PM 10/26/2016  2:19 PM Full Code [id number]  Vianne Bulls, MD Inpatient   09/24/2016  8:49 PM 10/03/2016 12:12 PM Full Code [id number]  Alvia Grove, PA-C Inpatient   09/09/2016  1:05 PM 09/09/2016  9:49 PM Full Code [id number]  Serafina Mitchell, MD Inpatient   09/05/2016  7:09 PM 09/06/2016  2:11 PM Full Code [id number]  Serafina Mitchell, MD Inpatient   12/26/2014  3:13 PM 12/26/2014  9:56 PM Full Code [id number]  Serafina Mitchell, MD Inpatient   09/01/2013 10:28 PM 09/08/2013  6:36 PM Full Code [id number]  [employer]  Renelda Loma, PA-C Inpatient    Advance Directive Documentation   Flowsheet Row Most Recent Value  Type of Advance Directive  Healthcare Power of Attorney, Living will  Pre-existing out of facility DNR order (yellow form or pink MOST form)  No data  "MOST" Form in Place?  No data       Prognosis:   guarded.   Discharge Planning:  ?SNF  Care plan was discussed with  Patient, daughter sons husband.   Thank you for allowing the Palliative Medicine Team to assist in the care of this patient.   Time In: 9 Time Out: 9.35 Total Time 35 Prolonged Time Billed  no       Greater than 50%  of this time was spent counseling and coordinating care related to the above assessment and plan.  Loistine Chance, MD [phone]  Please contact Palliative Medicine Team phone at [phone] for questions and concerns.

## 2016-12-25 NOTE — Progress Notes (Signed)
Patient sleeping, family members at bedside. Call light within reach

## 2016-12-25 NOTE — Progress Notes (Signed)
Orthopedic Tech Progress Note Patient Details:  [name] October 05, 1928 [id number]  Patient ID: [name], female   DOB: [birth year], 83 y.o.   MRN: [id number]   [name] 12/25/2016, 2:02 PM Pt unable to use trapeze bar patient helper

## 2016-12-25 NOTE — Progress Notes (Addendum)
[facility]  Subjective  - Patient is tired of fighting and wants to give up.  Patients family in room.   Objective (!) 173/55 87 97.9 F (36.6 C) (Axillary) (!) 21 95%  Intake/Output Summary (Last 24 hours) at 12/25/16 0732 Last data filed at 12/25/16 0522  Gross per 24 hour  Intake              100 ml  Output             1050 ml  Net             -950 ml    Right BKA stump clean and dry with minimal ecchymosis Heart S rhythm 91 bpm Lungs non labored breathing  Assessment/Planning: POD # 2 BKA CIR in recommended per PT Patients family is in the room and Palliative will be discussed She does not appear to be confused this am. Pain is still an issue   Laurence Slate [facility] 12/25/2016 7:32 AM --  Laboratory Lab Results:  Recent Labs  12/23/16 0320 12/23/16 1300  WBC 12.6* 13.1*  HGB 8.2* 7.9*  HCT 26.0* 25.4*  PLT 477* 528*   BMET  Recent Labs  12/23/16 1300 12/24/16 0414 12/25/16 0514  NA 135 134*  --   K 4.0 4.3  --   CL 95* 94*  --   CO2 29 28  --   GLUCOSE 114* 116*  --   BUN 13 14  --   CREATININE 0.92 0.98 0.97  CALCIUM 9.2 8.7*  --     COAG Lab Results  Component Value Date   INR 1.14 12/23/2016   INR 1.00 10/23/2016   INR 1.01 09/24/2016   No results found for: PTT   Agree with the above.  Patient's family now all at bedside.  She has decided to try and continue to improve rather than comfort measures.  We willproceed with mobilization with the anticipation of rehab and discharge when she is medically stable.  [name]

## 2016-12-25 NOTE — Progress Notes (Signed)
Nutrition Follow Up  DOCUMENTATION CODES:   Not applicable  INTERVENTION:    Consider short term enteral nutrition support.      If family desires, please place consult for [place] small bore feeding tube placement.   Continue Ensure Enlive po BID, each supplement provides 350 kcal and 20 grams of protein  NUTRITION DIAGNOSIS:   Increased nutrient needs related to wound healing as evidenced by estimated needs, ongoing  GOAL:   Patient will meet greater than or equal to 90% of their needs, unmet  MONITOR:   PO intake, Supplement acceptance, Labs, Weight trends, Skin, I & O's  ASSESSMENT:   83 year old female admitted on 1/25 with ischemic right great toe. S/P transmetatarsal amputation 1/25. Developed bleeding from amputation site and required wound exploration with cauterization 1/31.  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 Continues on a Regular diet. PO intake very poor at 0-25% per flowsheet records. Palliative Care Team following for goals of care. Has PCA pump for pain.  RD unable to complete Nutrition Focused Physical Exam at this time.  Diet Order:  Diet regular Room service appropriate? Yes; Fluid consistency: Thin  Skin:  Reviewed, no issues (R foot incision)  Last BM:  2/5  Height:   Ht Readings from Last 1 Encounters:  12/18/16 5\' 2"  (1.575 m)    Weight:   Wt Readings from Last 1 Encounters:  12/24/16 129 lb 9.9 oz (58.8 kg)    Ideal Body Weight:  50 kg  BMI:  Body mass index is 23.71 kg/m.  Estimated Nutritional Needs:   Kcal:  1450-1650  Protein:  75-85 gm  Fluid:  >/= 1.5 L  EDUCATION NEEDS:   No education needs identified at this time  Arthur Holms, RD, LDN Pager #: [phone] After-Hours Pager #: [phone]

## 2016-12-25 NOTE — Progress Notes (Signed)
      Cefepime was stopped after 7 days for UTI.   Luvinia Lucy MAUREEN PA-C

## 2016-12-26 NOTE — Progress Notes (Signed)
Rehab admissions - Continuing to follow for potential inpatient rehab admission.  I will follow progress over weekend and see how patient does with therapies and pain control.  Currently rehab beds are full.  I will check back on Monday.  Call me for questions.  RC:[phone]

## 2016-12-26 NOTE — Care Management Important Message (Signed)
Important Message  Patient Details  Name: [name] MRN: [id number] Date of Birth: November 28, 1927   Medicare Important Message Given:  Yes    Arnel Wymer Abena 12/26/2016, 2:00 PM

## 2016-12-26 NOTE — Progress Notes (Signed)
Physical Therapy Treatment Patient Details Name: [name] MRN: [id number] DOB: 11/08/1928 Today's Date: 12/26/2016    History of Present Illness Pt is an 83 y.o. female s/p R BKA on 2/6. PMHx: Anginal pain, Arthritis, Chronic lower back pain, DVT, Fibromyalgia, GERD, High cholesterol, HTN, Macular degeneration, PVD, TIA.    PT Comments    Pt will do best with pain meds 30 mins or so prior to PT session.  Pt with reports of pain at end of transfer once in recliner.  She moved much better with supine > sit and able to bring legs to EOB and use rail to A. Pt with supportive family and asking about taking off hall- deferred to nursing due to telemetry.  Discussion with family and NT about returning pt back to bed, as nurse tech stated it took 3-4 people to return to bed last time.  Pt with hoyer pad, but family reports it is uncomfortable for pt during transfer.  Discussed with family this vs. fear with SPT and pain with it.  Later discussed anterior/posterior scoot transfer with nurse tech as another safe option for nursing staff.   Follow Up Recommendations  CIR;Supervision/Assistance - 24 hour     Equipment Recommendations  None recommended by PT    Recommendations for Other Services       Precautions / Restrictions Precautions Precautions: Fall;Other (comment) Precaution Comments: daughter reports pt not to push or pull with Lt elbow more than 5 lbs (s/p total elbow replacement)  Restrictions RLE Weight Bearing: Non weight bearing    Mobility  Bed Mobility Overal bed mobility: Needs Assistance Bed Mobility: Supine to Sit     Supine to sit: Mod assist;+2 for physical assistance     General bed mobility comments: Pt able to bring legs to side and use rail to A.  MOD A with use of bed pad and to get trunk fully upright.  Transfers Overall transfer level: Needs assistance Equipment used: 2 person hand held assist Transfers: Sit to/from [facility] Sit  to Stand: Max assist;+2 physical assistance Stand pivot transfers: Max assist;+2 physical assistance       General transfer comment: With use of bed pad and gait belt. Pt fearful of falling.  Ambulation/Gait                 Stairs            Wheelchair Mobility    Modified Rankin (Stroke Patients Only)       Balance   Sitting-balance support: Bilateral upper extremity supported Sitting balance-Leahy Scale: Fair     Standing balance support: Bilateral upper extremity supported Standing balance-Leahy Scale: Zero                      Cognition Arousal/Alertness: Awake/alert Behavior During Therapy: Flat affect;Anxious Overall Cognitive Status: Within Functional Limits for tasks assessed                      Exercises General Exercises - Lower Extremity Quad Sets: Right;5 reps;Supine    General Comments General comments (skin integrity, edema, etc.): Daughter asking about taking pt out of the room.  Discussed taking out in unit in recliner vs off of the floor.  Deferred to nursing due to telemetry.   NT in at end of session and concerned about getting pt back to bed.  Discussed use of hoyer pad, SPT and anterior posterior scoot transfer with her.  Family states  hoyer is uncomfortable for pt, but discussed a 3rd person could A with holding legs if need be. Discussed sorenss with Hoyer vs. SPT with fear of falling and pain with it.        Pertinent Vitals/Pain Pain Assessment: Faces Faces Pain Scale: Hurts whole lot Pain Location: RLE after transfer Pain Descriptors / Indicators: Sore Pain Intervention(s): Limited activity within patient's tolerance;Monitored during session;Repositioned;Patient requesting pain meds-RN notified    Home Living                      Prior Function            PT Goals (current goals can now be found in the care plan section) Acute Rehab PT Goals Patient Stated Goal: none stated PT Goal Formulation:  With patient/family Time For Goal Achievement: 01/07/17 Potential to Achieve Goals: Fair    Frequency    Min 3X/week      PT Plan      Co-evaluation             End of Session Equipment Utilized During Treatment: Gait belt Activity Tolerance: Patient limited by pain Patient left: in chair;with call bell/phone within reach;with family/visitor present     Time: [id number] PT Time Calculation (min) (ACUTE ONLY): 23 min  Charges:  $Therapeutic Activity: 23-37 mins                    G Codes:      Lewellyn Fultz LUBECK 12/26/2016, 11:18 AM

## 2016-12-26 NOTE — Progress Notes (Signed)
Daily Progress Note   Patient Name: [name]       Date: 12/26/2016 DOB: 11/07/1928  Age: 83 y.o. MRN#: [id number] Attending Physician: Serafina Mitchell, MD Primary Care Physician: No primary care provider on file. Admit Date: 12/11/2016  Reason for Consultation/Follow-up: Pain control  Subjective:  awake, alert resting in bed Slept well overnight In better spirits this am No bowel movement yet.    Length of Stay: 15  Current Medications: Scheduled Meds:  . clopidogrel  75 mg Oral Daily  . enoxaparin (LOVENOX) injection  30 mg Subcutaneous Q24H  . feeding supplement (ENSURE ENLIVE)  237 mL Oral BID BM  . fentaNYL  12.5 mcg Transdermal Q72H  . gabapentin  600 mg Oral TID  . hydrochlorothiazide  6.25 mg Oral Daily  . irbesartan  75 mg Oral Daily  . metoprolol  50 mg Oral Daily  . metoprolol  25 mg Oral QHS  . multivitamin with minerals  1 tablet Oral Daily  . pantoprazole  40 mg Oral Daily  . senna  1 tablet Oral BID    Continuous Infusions: . sodium chloride 10 mL/hr at 12/25/16 2119  . lactated ringers 50 mL/hr at 12/17/16 1032  . lactated ringers Stopped (12/23/16 1234)    PRN Meds: acetaminophen, alum & mag hydroxide-simeth, bisacodyl, diazepam, guaiFENesin-dextromethorphan, hydrALAZINE, HYDROmorphone (DILAUDID) injection, labetalol, loperamide, magnesium sulfate 1 - 4 g bolus IVPB, metoprolol, ondansetron, oxyCODONE, phenol, silver nitrate applicators  Physical Exam         Elderly WF in no distress Awake resting in bed S1 S2 S.p RLE amputation.   Vital Signs: BP (!) 181/73 (BP Location: Right Arm)   Pulse 84   Temp 98.6 F (37 C) (Oral)   Resp 18   Ht 5\' 2"  (1.575 m)   Wt 58.8 kg (129 lb 9.9 oz)   SpO2 97%   BMI 23.71 kg/m  SpO2: SpO2: 97 % O2  Device: O2 Device: Not Delivered O2 Flow Rate: O2 Flow Rate (L/min): 2 L/min  Intake/output summary:   Intake/Output Summary (Last 24 hours) at 12/26/16 0956 Last data filed at 12/26/16 0443  Gross per 24 hour  Intake              280 ml  Output  100 ml  Net              180 ml   LBM: Last BM Date: 12/22/16 Baseline Weight: Weight: 58.2 kg (128 lb 4.9 oz) Most recent weight: Weight: 58.8 kg (129 lb 9.9 oz)       Palliative Assessment/Data:    Flowsheet Rows   Flowsheet Row Most Recent Value  Intake Tab  Referral Department  Surgery  Unit at Time of Referral  Med/Surg Unit  Palliative Care Primary Diagnosis  Other (Comment)  Date Notified  12/19/16  Palliative Care Type  Return patient Palliative Care  Reason for referral  Pain  Date of Admission  12/11/16  Date first seen by Palliative Care  12/20/16  # of days Palliative referral response time  1 Day(s)  # of days IP prior to Palliative referral  8  Clinical Assessment  Palliative Performance Scale Score  30%  Pain Max last 24 hours  6  Pain Min Last 24 hours  4  Psychosocial & Spiritual Assessment  Palliative Care Outcomes  Patient/Family meeting held?  Yes  Who was at the meeting?  daughter and patient.   Palliative Care Outcomes  Improved pain interventions      Patient Active Problem List   Diagnosis Date Noted  . Acute on chronic combined systolic and diastolic CHF (congestive heart failure) ([place]) 12/22/2016  . Anemia 12/22/2016  . Palliative care encounter   . Lower extremity pain, right   . Acute GI bleeding 10/23/2016  . Paroxysmal atrial fibrillation ([place]) 10/03/2016  . 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 PAD (peripheral artery disease) ([place]) 09/05/2016  . Groin pain 04/02/2015  . Aftercare following surgery of the circulatory system, [place] 12/13/2013  . Shingles 10/26/2013  . Anxiety 10/26/2013  . Acute blood loss  anemia 09/05/2013  . Elbow fracture, left 09/05/2013  . Left acetabular fracture ([place]) 09/05/2013  . Ankle fracture, left 09/05/2013  . HTN (hypertension) 09/03/2013  . HLD (hyperlipidemia) 09/03/2013  . Peripheral vascular disease, unspecified 05/10/2012  . Chronic total occlusion of artery of the extremities ([facility]) 01/19/2012    Palliative Care Assessment & Plan   Patient Profile:    Assessment:  PAD s/p R BKA Pain: total pain, has physical, neuropathic and emotional components.  Constipation Existential suffering Not eating well   Recommendations/Plan:  Continue low dose trans dermal fentanyl, PO PRN Oxy IR, while still having PRN IV Dilaudid available for breakthrough pain.  Bowel regimen, discussed with RN about trying Dulcolax rectal suppository.   Encourage PO, OOB. Daughter wants to get the patient in a wheelchair and get the patient out of her room today.       We will continue to follow along and help guide appropriate decision making, along with appropriate symptom management.    Code Status:    Code Status Orders        Start     Ordered   12/20/16 1114  Limited resuscitation (code)  Continuous    Question Answer Comment  In the event of cardiac or respiratory ARREST: Initiate Code Blue, Call Rapid Response Yes   In the event of cardiac or respiratory ARREST: Perform CPR No   In the event of cardiac or respiratory ARREST: Perform Intubation/Mechanical Ventilation No   In the event of cardiac or respiratory ARREST: Use NIPPV/BiPAp only if indicated Yes   In the event of cardiac or respiratory ARREST: Administer ACLS  medications if indicated Yes   In the event of cardiac or respiratory ARREST: Perform Defibrillation or Cardioversion if indicated No      12/20/16 1121    Code Status History    Date Active Date Inactive Code Status Order ID Comments User Context   12/11/2016  1:14 PM 12/20/2016 11:21 AM Full Code [id number]  Ulyses Amor, PA-C Inpatient    10/23/2016  7:46 PM 10/26/2016  2:19 PM Full Code [id number]  Vianne Bulls, MD Inpatient   09/24/2016  8:49 PM 10/03/2016 12:12 PM Full Code [id number]  Alvia Grove, PA-C Inpatient   09/09/2016  1:05 PM 09/09/2016  9:49 PM Full Code [id number]  Serafina Mitchell, MD Inpatient   09/05/2016  7:09 PM 09/06/2016  2:11 PM Full Code [id number]  Serafina Mitchell, MD Inpatient   12/26/2014  3:13 PM 12/26/2014  9:56 PM Full Code [id number]  Serafina Mitchell, MD Inpatient   09/01/2013 10:28 PM 09/08/2013  6:36 PM Full Code [id number]  [place], PA-C Inpatient    Advance Directive Documentation   Flowsheet Row Most Recent Value  Type of Advance Directive  Healthcare Power of Attorney, Living will  Pre-existing out of facility DNR order (yellow form or pink MOST form)  No data  "MOST" Form in Place?  No data       Prognosis:   guarded.   Discharge Planning:  ?SNF  Care plan was discussed with  Patient, daughter and RN.   Thank you for allowing the Palliative Medicine Team to assist in the care of this patient.   Time In: 8 Time Out: 8.25 Total Time 25 Prolonged Time Billed  no       Greater than 50%  of this time was spent counseling and coordinating care related to the above assessment and plan.  Loistine Chance, MD [phone]  Please contact Palliative Medicine Team phone at [phone] for questions and concerns.

## 2016-12-26 NOTE — Progress Notes (Signed)
[facility] [place]  Subjective  - sitting up in chair.  Performed right stump SLR times a count of 5.   Objective (!) 181/73 84 98.6 F (37 C) (Oral) 18 97%  Intake/Output Summary (Last 24 hours) at 12/26/16 1223 Last data filed at 12/26/16 0443  Gross per 24 hour  Intake              140 ml  Output              100 ml  Net               40 ml    Right BKA dressing clean and dry Lungs non labored breathing  Assessment/Planning: POD # 3 BKA  Slowy showing improvement, eating a little better, mobility better, and out look improved.  Continue current care and motivation.  Laurence Slate [facility] 12/26/2016 12:23 PM --  Laboratory Lab Results:  Recent Labs  12/23/16 1300  WBC 13.1*  HGB 7.9*  HCT 25.4*  PLT 528*   BMET  Recent Labs  12/23/16 1300 12/24/16 0414 12/25/16 0514  NA 135 134*  --   K 4.0 4.3  --   CL 95* 94*  --   CO2 29 28  --   GLUCOSE 114* 116*  --   BUN 13 14  --   CREATININE 0.92 0.98 0.97  CALCIUM 9.2 8.7*  --     COAG Lab Results  Component Value Date   INR 1.14 12/23/2016   INR 1.00 10/23/2016   INR 1.01 09/24/2016   No results found for: PTT

## 2016-12-26 NOTE — Progress Notes (Signed)
Patient ID: [name], female   DOB: [birth year], 83 y.o.   MRN: [id number] I paid a social visit to the patient and her family at the request of the family. We discussed postoperative expectations for a transtibial amputation discussed skilled nursing and outpatient therapy.

## 2016-12-27 ENCOUNTER — Inpatient Hospital Stay (HOSPITAL_COMMUNITY): Payer: Medicare Other

## 2016-12-27 NOTE — Progress Notes (Signed)
  Progress Note    12/27/2016 8:13 AM 4 Days Post-Op  Subjective:  Feeling short of breath  Vitals:   12/27/16 0600 12/27/16 0703  BP: (!) 173/54 (!) 155/64  Pulse: 86   Resp: 17 16  Temp: 98.9 F (37.2 C)     Physical Exam: aaox3 Does not appear labored in her breathing Right bka dressing cdi Left foot is warm  CBC    Component Value Date/Time   WBC 13.1 (H) 12/23/2016 1300   RBC 2.77 (L) 12/23/2016 1300   HGB 7.9 (L) 12/23/2016 1300   HCT 25.4 (L) 12/23/2016 1300   PLT 528 (H) 12/23/2016 1300   MCV 91.7 12/23/2016 1300   MCH 28.5 12/23/2016 1300   MCHC 31.1 12/23/2016 1300   RDW 14.6 12/23/2016 1300   LYMPHSABS 1.9 10/23/2016 1145   MONOABS 0.6 10/23/2016 1145   EOSABS 0.5 10/23/2016 1145   BASOSABS 0.1 10/23/2016 1145    BMET    Component Value Date/Time   NA 134 (L) 12/24/2016 0414   K 4.3 12/24/2016 0414   CL 94 (L) 12/24/2016 0414   CO2 28 12/24/2016 0414   GLUCOSE 116 (H) 12/24/2016 0414   BUN 14 12/24/2016 0414   CREATININE 0.97 12/25/2016 0514   CALCIUM 8.7 (L) 12/24/2016 0414   GFRNONAA 51 (L) 12/25/2016 0514   GFRAA 60 (L) 12/25/2016 0514    INR    Component Value Date/Time   INR 1.14 12/23/2016 1300     Intake/Output Summary (Last 24 hours) at 12/27/16 0813 Last data filed at 12/26/16 2000  Gross per 24 hour  Intake                0 ml  Output              500 ml  Net             -500 ml     Assessment:  83 y.o. female is s/p right bka   Plan: Continue to mobilize Encouraged po intake Chest xray this a.m to eval subjective shortness of breath   Brandon C. Donzetta Matters, MD [facility] [place] Office: [phone] Pager: [phone]  12/27/2016 8:13 AM

## 2016-12-28 NOTE — Progress Notes (Addendum)
[facility] Progress Note  Subjective    Breathing comfortably this am. Pain is ok.   Objective Vitals:   12/27/16 1436 12/27/16 2025  BP: (!) 148/58 (!) 183/57  Pulse: 80 83  Resp: 20 18  Temp: 98.3 F (36.8 C) 98.7 F (37.1 C)    Intake/Output Summary (Last 24 hours) at 12/28/16 1015 Last data filed at 12/27/16 1915  Gross per 24 hour  Intake               50 ml  Output              200 ml  Net             -150 ml   Right BKA with mild swelling and ecchymosis. Incision clean and intact. No drainage seen.   Assessment/Planning: 83 y.o. female is s/p: right BKA 5 Days Post-Op   SOB: Comfortable this morning. CXR with with small bilateral effusions. This has significantly improved from previous 12/19/16. Hold on diuresis.  Right BKA healing. Pain is better. Hopefully to CIR tomorrow if bed available.   Alvia Grove 12/28/2016 10:15 AM --  Laboratory CBC    Component Value Date/Time   WBC 13.1 (H) 12/23/2016 1300   HGB 7.9 (L) 12/23/2016 1300   HCT 25.4 (L) 12/23/2016 1300   PLT 528 (H) 12/23/2016 1300    BMET    Component Value Date/Time   NA 134 (L) 12/24/2016 0414   K 4.3 12/24/2016 0414   CL 94 (L) 12/24/2016 0414   CO2 28 12/24/2016 0414   GLUCOSE 116 (H) 12/24/2016 0414   BUN 14 12/24/2016 0414   CREATININE 0.97 12/25/2016 0514   CALCIUM 8.7 (L) 12/24/2016 0414   GFRNONAA 51 (L) 12/25/2016 0514   GFRAA 60 (L) 12/25/2016 0514    COAG Lab Results  Component Value Date   INR 1.14 12/23/2016   INR 1.00 10/23/2016   INR 1.01 09/24/2016   No results found for: PTT  Antibiotics Anti-infectives    Start     Dose/Rate Route Frequency Ordered Stop   12/23/16 0700  ceFAZolin (ANCEF) IVPB 1 g/50 mL premix    Comments:  Send with pt to OR   1 g 100 mL/hr over 30 Minutes Intravenous To ShortStay Surgical 12/23/16 0537 12/23/16 1118   12/19/16 1000  ceFEPIme (MAXIPIME) 1 g in dextrose 5 % 50 mL IVPB  Status:  Discontinued     1 g 100 mL/hr over 30 Minutes Intravenous Every 24 hours 12/19/16 0858 12/25/16 1316   12/16/16 2000  cefUROXime (ZINACEF) 750 mg in dextrose 5 % 50 mL IVPB  Status:  Discontinued     750 mg 100 mL/hr over 30 Minutes Intravenous Every 8 hours 12/16/16 1855 12/19/16 0857   12/14/16 0800  cephALEXin (KEFLEX) capsule 250 mg  Status:  Discontinued     250 mg Oral Every 8 hours 12/14/16 0717 12/14/16 0722   12/14/16 0800  cephALEXin (KEFLEX) capsule 500 mg  Status:  Discontinued     500 mg Oral Every 8 hours 12/14/16 0722 12/16/16 1913   12/11/16 2000  cefUROXime (ZINACEF) 1.5 g in dextrose 5 % 50 mL IVPB     1.5 g 100 mL/hr over 30 Minutes Intravenous Every 12 hours 12/11/16 1217 12/12/16 0830   12/11/16 0728  cefUROXime (ZINACEF) 1.5 g in dextrose 5 % 50 mL IVPB     1.5 g 100 mL/hr over 30 Minutes Intravenous 30 min pre-op 12/11/16  [id number] 12/11/16 [place], PA-C [facility] Office: [phone] Pager: [phone] 12/28/2016 10:15 AM  I have independently interviewed patient and agree with PA assessment and plan above. Awaiting CIR, she is looking much better and pain is well controlled today.  Brandon C. Donzetta Matters, MD [facility] [place] Office: [phone] Pager: [phone]

## 2016-12-29 NOTE — Progress Notes (Signed)
[facility] [place]  Subjective  - Resting well. Granddaughter states she has cut down on PO pain medication to q8 over the weekend.  No PT this weekend, but doing better with bed mobility over all.   Objective (!) 148/55 78 98.3 F (36.8 C) (Oral) 19 97%  Intake/Output Summary (Last 24 hours) at 12/29/16 0721 Last data filed at 12/28/16 1700  Gross per 24 hour  Intake              360 ml  Output                3 ml  Net              357 ml    Right BKA dressing clean and dry  Assessment/Planning: POD # 6 right BKA  Pain control improving and over all out look. Will order right BKA sleeve from [facility] today and wait for CIR.  Laurence Slate [facility] 12/29/2016 7:21 AM --  Laboratory Lab Results: No results for input(s): WBC, HGB, HCT, PLT in the last 72 hours. BMET No results for input(s): NA, K, CL, CO2, GLUCOSE, BUN, CREATININE, CALCIUM in the last 72 hours.  COAG Lab Results  Component Value Date   INR 1.14 12/23/2016   INR 1.00 10/23/2016   INR 1.01 09/24/2016   No results found for: PTT

## 2016-12-29 NOTE — Progress Notes (Signed)
Rehab admissions - Noted patient is doing better.  Currently rehab beds are full.  I will check in am for bed availability and medical readiness.  Agree with need for back up plan in case no bed available for patient tomorrow.  Call me for questions.  RC:[phone]

## 2016-12-29 NOTE — Progress Notes (Signed)
Orthopedic Tech Progress Note Patient Details:  [name] 11-Sep-1928 [id number]  Patient ID: [name], female   DOB: 02-15-1928, 83 y.o.   MRN: [id number]   [name] 12/29/2016, 9:14 AM Called in bio-tech brace order; spoke with Bella Kennedy

## 2016-12-29 NOTE — Discharge Summary (Signed)
[facility] Discharge Summary   Patient ID:  [name] MRN: [id number] DOB/AGE: 1928/02/09 83 y.o.  Admit date: 12/11/2016 Discharge date: 12/29/2016 Date of Surgery: 12/11/2016 - 12/23/2016 Surgeon: Juliann Mule): Serafina Mitchell, MD  Admission Diagnosis: Ischemic right great toe, non viable tissue Nonhealing right transmetatarsal amputation T81.89XA Infected Right Knee  Discharge Diagnoses:  Ischemic right great toe, non viable tissue Nonhealing right transmetatarsal amputation T81.89XA Infected Right Knee  Secondary Diagnoses: Past Medical History:  Diagnosis Date  . Anemia   . Anginal pain ([place])   . Arthritis    "qwhere" (09/05/2016)  . Atrial fibrillation ([place]) 09/2016  . Chronic lower back pain   . Complication of anesthesia    "takes a long time for it to wear off; I can hallucinate if I take too much" (09/05/2016)  . DVT (deep venous thrombosis) ([place]) 10/2009  . Fall from steps 08/31/2013   Fx. pelvis, Left Hip, Left Elbow  . Fibromyalgia   . GERD (gastroesophageal reflux disease)    09/22/16- "no too much anymore"  . GI bleed 10/24/2016  . High cholesterol   . History of blood transfusion   . History of hiatal hernia   . Hypertension   . Macular degeneration, wet ([place])    "started in right eye; now legally blind in that eye; now started in left eye but pretty much in control" (09/05/2016)  . Osteoporosis   . Peripheral vascular disease ([place])    nonviable tissue Right foot  . PONV (postoperative nausea and vomiting)   . Squamous cell carcinoma of skin of right calf Aug. 2015  . Stroke ([place])    TIA's no residual  . TIA (transient ischemic attack)    "several at once; none in a long time" (09/05/2016)    Procedure(s): AMPUTATION BELOW KNEE  Discharged Condition: good  HPI: [name] is a 83 y.o. female who presents with worsening right great toe wound and pain. She states that her pain is intolerable and she is unable to  sleep. She was last seen in the office by [name] on 10/27/16 where toe amputation was discussed if her right great toe wound worsened.   She has a complicated past vascular history. She previously underwent angioplasty and stenting of her right superficial femoral artery on 09/05/16. This was done for occlusion. On 09/24/2016 she went to the operating room for a left femoral to above-knee popliteal artery bypass graft with 6 mm propatent PTFE. The distal anastomosis was to the previous vein bypass graft which was somewhat sclerotic. On postoperative day 2 the graft was occluded and she developed severe pain. She went back urgently to the operating room and had subintimal angioplasty and subsequent stenting using Viabahn stents. She also had angioplasty of anterior tibial artery. In the recovery room she did not have a pulse in her right groin and therefore she went back for exploration and ended up with a femoral endarterectomy with patch angioplasty.  While in the hospital she developed atrial fibrillation with rapid ventricular response. She converted with metoprolol. She was started on Eliquis, in addition to her Plavix.Unfortunately, she developed a GI bleed requiring admission to the hospital. Her Eliquis has been discontinued.She is currently on Plavix and ASA only.    Hospital Course:  [name] is a 83 y.o. female is S/P Procedure(s): 12/11/2016 TRANSMETATARSAL AMPUTATION (Right) Multiple accounts of bleeding from amputation site when in a dependent position over the weekend.  Pain sever and uncontrolled.  12/17/2016 Revision trans met amputation wound exploration UTI was diagnosed and treated with IV Zinacef  12/19/2016 Rapid response was called to bedside Initial Focused Assessment:  Called by Threasa Beards, RN with concerns for pt respiratory status with RR 36, Temp 102.5.  On arrival to room pt is alert, oriented, offers no c/o SOB,  Nor chest pain.  Skin is hot to touch.   181/63  100 HR.  97% 02 sats on 2l [place]. Crackles on Left Lower lobe, diminished BS RLL   Interventions: Tylenol 1000 mg  po given per order, Blood cultures drawn per MD order, Labetolol 10 mg po per MD order  PCXR: Bibasilar opacities likely combination of pleural fluid and atelectasis.  Cardiomegaly and vascular congestion c/w CHF  Place on continuous pulse ox, encourage cough and deep breathing.  Melanie, RN to notify Dr Gwenlyn Saran of CXR results  Lasix 20 mg iv was given for acute CHF, diuresed 1 L in 6 hours.  12/19/2016 She developed A fib [facility] was consulted.   Paroxysmal atrial fibrillation during previous hospitalization in 10/2016. She had a GI bleed on eliquis requiring hospitalization and is now only on ASA and plavix.   She cardioverted spontaneously into the SR at noon today.  Recommendations per cardiology: It appears that a-fib episode is being triggered by stress - surgery, fever, but she cardioverts quickly. I would treat the underlying disease, but if she has recurrent episodes of a-fib, consider starting amiodarone for rhythm control.   Palliative consult called due to continued pain complaints and inability to thrive 12/20/2016.  Trial of PCA,     AMPUTATION BELOW KNEE tissue death to the right transmet area 12/23/2016  12/25/2016 patient very up set wants to give up.  Pain continues to be an issue.  POD # 7 BKA  Pain is getting better.  She uses IV push occasionally Mobility is improving Pending CIR if bed available verse SNF.  Places they have chosen have no beds yet either.  12/30/2016 She was transferred to CIR for rehabilitation in stable condition with PO pain control and viable right BKA.  Significant Diagnostic Studies: CBC Lab Results  Component Value Date   WBC 13.1 (H) 12/23/2016   HGB 7.9 (L) 12/23/2016   HCT 25.4 (L) 12/23/2016   MCV 91.7 12/23/2016   PLT 528 (H) 12/23/2016    BMET    Component Value Date/Time   NA 134 (L) 12/24/2016 0414    K 4.3 12/24/2016 0414   CL 94 (L) 12/24/2016 0414   CO2 28 12/24/2016 0414   GLUCOSE 116 (H) 12/24/2016 0414   BUN 14 12/24/2016 0414   CREATININE 0.97 12/25/2016 0514   CALCIUM 8.7 (L) 12/24/2016 0414   GFRNONAA 51 (L) 12/25/2016 0514   GFRAA 60 (L) 12/25/2016 0514   COAG Lab Results  Component Value Date   INR 1.14 12/23/2016   INR 1.00 10/23/2016   INR 1.01 09/24/2016     Disposition:  Discharge to :Rehab   Verbal and written Discharge instructions given to the patient. Wound care per Discharge AVS Follow-up Information    [facility] Follow up.   Specialty:  [place] Why:  HHPT- arranged- they will call you to set up home visits Contact information: [address] [place] [address] [phone]        Inc[facility] - Dme [facility] Follow up.   Why:  rollator arranged- delivered to room Contact information: [address] [place] [address] [phone]  F/U in our office in 3 weeks with [name]  Signed: Theda Sers, Khyrie Masi [facility] 12/29/2016, 7:44 AM

## 2016-12-29 NOTE — Progress Notes (Signed)
Occupational Therapy Treatment Patient Details Name: [name] MRN: [id number] DOB: [birth year] Today's Date: 12/29/2016    History of present illness Pt is an 83 y.o. female s/p R BKA on 2/6. PMHx: Anginal pain, Arthritis, Chronic lower back pain, DVT, Fibromyalgia, GERD, High cholesterol, HTN, Macular degeneration, PVD, TIA.   OT comments  Pt seen for ADL retraining session this morning with focus on bed mobility, sitting EOB/balance x64min at supervision level after sitting EOB to encourange increased activity tolerance. Pt requesting pain medication at begining of OT, RN staff made aware. After ~6-68min, pt requesting getting back to bed reporting that stump begins to throb and "I get lightheaded". Pt is a good candidate for CIR Rehab is hoepful to go later today if medically able. Pt/family were educated verbally in transfers (SPT vs sliding board) and pt is agreeable to this when she gets to Rehab.  Acute OT goals updated.   Follow Up Recommendations  CIR;Supervision/Assistance - 24 hour    Equipment Recommendations  Other (comment) (Defer to next venue)    Recommendations for Other Services      Precautions / Restrictions Precautions Precautions: Fall;Other (comment) Precaution Comments: daughter reports pt not to push or pull with Lt elbow more than 5 lbs (s/p total elbow replacement)  Restrictions Weight Bearing Restrictions: Yes RLE Weight Bearing: Non weight bearing       Mobility Bed Mobility Overal bed mobility: Needs Assistance Bed Mobility: Sidelying to Sit;Sit to Supine;Supine to Sit Rolling: Min assist Sidelying to sit: Mod assist Supine to sit: Mod assist Sit to supine: Mod assist   General bed mobility comments: Pt able to bring legs to side and use rail to A.  MOD A with use of bed pad and to get trunk fully upright.  Transfers                      Balance Overall balance assessment: Needs assistance Sitting-balance support: No upper  extremity supported Sitting balance-Leahy Scale: Fair   Postural control: Posterior lean                         ADL Overall ADL's : Needs assistance/impaired                                       General ADL Comments: Pt seen for ADL retraining session this morning with focus on bed mobility, sitting EOB/balance x22min at supervision level after sitting EOB to encourange increased activity tolerance. Pt requesting pain medication at begining of OT, RN staff made aware. After ~6-19min, pt requesting getting back to bed reporting that stump begins to throb and "I get lightheaded". Pt is a good candidate for CIR Rehab is hoepful to go later today if medically able. Pt/family were educated verbally in transfers (SPT vs sliding board) and pt is agreeable to this when she gets to Rehab.  Need to update acute OT goals.      Vision  Wears glasses at all times. No change from baseline.                   Perception     Praxis      Cognition   Behavior During Therapy: WFL for tasks assessed/performed Overall Cognitive Status: Within Functional Limits for tasks assessed  Extremity/Trunk Assessment               Exercises     Shoulder Instructions       General Comments      Pertinent Vitals/ Pain       Pain Assessment: 0-10 Pain Score: 4  Faces Pain Scale: Hurts little more Pain Location: RLE after transfer Pain Descriptors / Indicators: Sore;Throbbing Pain Intervention(s): Limited activity within patient's tolerance;Monitored during session;Repositioned;Patient requesting pain meds-RN notified  Home Living                                          Prior Functioning/Environment              Frequency  Min 2X/week        Progress Toward Goals  OT Goals(current goals can now be found in the care plan section)  Progress towards OT goals:  (Goals upgraded today folowing surgery/R BKA  on 12/23/16)  Acute Rehab OT Goals Patient Stated Goal: Rehab/CIR later today if able Time For Goal Achievement: 01/05/17 Potential to Achieve Goals: Good  Plan Discharge plan remains appropriate    Co-evaluation                 End of Session     Activity Tolerance Patient tolerated treatment well;Patient limited by pain;Patient limited by fatigue   Patient Left in bed;with call bell/phone within reach;with family/visitor present   Nurse Communication Patient requests pain meds (Notified x2)        Time: [id number] OT Time Calculation (min): 24 min  Charges: OT General Charges $OT Visit: 1 Procedure OT Treatments $Therapeutic Activity: 23-37 mins  [place], Amy Beth Dixon, OTR/L 12/29/2016, 9:59 AM

## 2016-12-29 NOTE — Progress Notes (Signed)
Physical Therapy Treatment Patient Details Name: [name] MRN: [id number] DOB: 1928-10-30 Today's Date: 12/29/2016    History of Present Illness Pt is an 83 y.o. female s/p R BKA on 2/6. PMHx: Anginal pain, Arthritis, Chronic lower back pain, DVT, Fibromyalgia, GERD, High cholesterol, HTN, Macular degeneration, PVD, TIA.    PT Comments    Pt much improved from pain stand point however pt remains limited by decreased strength, anxiety and L LE pain with WBing. Pt maxAx1 with std pvt to [facility] then to bed. Will trial slide board transfers to w/c next  PT session as pt with poor L LE WBing tolerance. Con't to be an excellent candidate for CIR. Acute PT to con't to follow.  Follow Up Recommendations  CIR;Supervision/Assistance - 24 hour     Equipment Recommendations  None recommended by PT    Recommendations for Other Services       Precautions / Restrictions Precautions Precautions: Fall;Other (comment) Precaution Comments: daughter reports pt not to push or pull with Lt elbow more than 5 lbs (s/p total elbow replacement)  Restrictions Weight Bearing Restrictions: Yes RLE Weight Bearing: Non weight bearing    Mobility  Bed Mobility Overal bed mobility: Needs Assistance Bed Mobility: Sit to Supine     Supine to sit: Min assist     General bed mobility comments: pt able to scoot self back on bed and manage both LEs back into bed with minA  Transfers Overall transfer level: Needs assistance Equipment used:  (1 person lift with gait belt) Transfers: Sit to/from [employer] Transfers Sit to Stand: Max assist (or modAx2) Stand pivot transfers: Max assist (or modAx2)       General transfer comment: max directional v/c's to hold onto PT to prevent pt from reaching impulsively and retropulsion. unable to achieve L knee terminal extension, knee and foot blocked. pt c/o L LE pain and fear of falling. completed with 1 person as pt impulsive and can't focus, easier to  keep pt on task and elimate impulsivity  Ambulation/Gait             General Gait Details: unable   Stairs            Wheelchair Mobility    Modified Rankin (Stroke Patients Only)       Balance Overall balance assessment: Needs assistance Sitting-balance support: No upper extremity supported Sitting balance-Leahy Scale: Fair     Standing balance support: Bilateral upper extremity supported Standing balance-Leahy Scale: Zero Standing balance comment: requires physical assist, unable to tolerate R LE WBing                    Cognition Arousal/Alertness: Awake/alert Behavior During Therapy: Anxious (falling and LE pain with mobility) Overall Cognitive Status: Within Functional Limits for tasks assessed                      Exercises General Exercises - Lower Extremity Long Arc Quad: AROM;20 reps;Seated    General Comments General comments (skin integrity, edema, etc.): assisted to [facility], pt dependent for hygiene s/p BM      Pertinent Vitals/Pain Pain Assessment: 0-10 Pain Score: 4  Pain Location: bilat LEs Pain Descriptors / Indicators: Sore;Throbbing Pain Intervention(s): Limited activity within patient's tolerance;Monitored during session    Home Living                      Prior Function  PT Goals (current goals can now be found in the care plan section) Acute Rehab PT Goals Patient Stated Goal: back to bed Progress towards PT goals: Progressing toward goals    Frequency    Min 3X/week      PT Plan Current plan remains appropriate    Co-evaluation             End of Session Equipment Utilized During Treatment: Gait belt Activity Tolerance: Patient limited by fatigue (limited by anxiety) Patient left: in bed;with call bell/phone within reach;with family/visitor present     Time: [id number] PT Time Calculation (min) (ACUTE ONLY): 24 min  Charges:  $Therapeutic Activity: 23-37 mins                     G CodesBerline Lopes 01/22/2017, 3:47 PM   Kittie Plater, PT, DPT Pager #: [phone] Office #: [phone]

## 2016-12-30 ENCOUNTER — Inpatient Hospital Stay (HOSPITAL_COMMUNITY)
Admission: RE | Admit: 2016-12-30 | Discharge: 2017-01-13 | DRG: 560 | Disposition: A | Discharge status: Home Health | Payer: Medicare Other | Source: Intra-hospital | Attending: Physical Medicine & Rehabilitation | Admitting: Physical Medicine & Rehabilitation

## 2016-12-30 DIAGNOSIS — N183 Chronic kidney disease, stage 3 (moderate): Secondary | ICD-10-CM | POA: Diagnosis present

## 2016-12-30 DIAGNOSIS — F419 Anxiety disorder, unspecified: Secondary | ICD-10-CM | POA: Diagnosis present

## 2016-12-30 DIAGNOSIS — I480 Paroxysmal atrial fibrillation: Secondary | ICD-10-CM

## 2016-12-30 DIAGNOSIS — M797 Fibromyalgia: Secondary | ICD-10-CM | POA: Diagnosis present

## 2016-12-30 DIAGNOSIS — I10 Essential (primary) hypertension: Secondary | ICD-10-CM

## 2016-12-30 DIAGNOSIS — I129 Hypertensive chronic kidney disease with stage 1 through stage 4 chronic kidney disease, or unspecified chronic kidney disease: Secondary | ICD-10-CM | POA: Diagnosis present

## 2016-12-30 DIAGNOSIS — G546 Phantom limb syndrome with pain: Secondary | ICD-10-CM | POA: Diagnosis not present

## 2016-12-30 DIAGNOSIS — Z4781 Encounter for orthopedic aftercare following surgical amputation: Principal | ICD-10-CM

## 2016-12-30 DIAGNOSIS — Z79899 Other long term (current) drug therapy: Secondary | ICD-10-CM | POA: Diagnosis not present

## 2016-12-30 DIAGNOSIS — G8918 Other acute postprocedural pain: Secondary | ICD-10-CM | POA: Diagnosis not present

## 2016-12-30 DIAGNOSIS — Z96621 Presence of right artificial elbow joint: Secondary | ICD-10-CM | POA: Diagnosis present

## 2016-12-30 DIAGNOSIS — K5900 Constipation, unspecified: Secondary | ICD-10-CM | POA: Diagnosis present

## 2016-12-30 DIAGNOSIS — Z89511 Acquired absence of right leg below knee: Secondary | ICD-10-CM | POA: Diagnosis not present

## 2016-12-30 DIAGNOSIS — D62 Acute posthemorrhagic anemia: Secondary | ICD-10-CM | POA: Diagnosis present

## 2016-12-30 DIAGNOSIS — I4891 Unspecified atrial fibrillation: Secondary | ICD-10-CM | POA: Diagnosis present

## 2016-12-30 DIAGNOSIS — T8131XA Disruption of external operation (surgical) wound, not elsewhere classified, initial encounter: Secondary | ICD-10-CM | POA: Diagnosis not present

## 2016-12-30 DIAGNOSIS — I5043 Acute on chronic combined systolic (congestive) and diastolic (congestive) heart failure: Secondary | ICD-10-CM | POA: Diagnosis not present

## 2016-12-30 DIAGNOSIS — D649 Anemia, unspecified: Secondary | ICD-10-CM | POA: Diagnosis not present

## 2016-12-30 LAB — CBC
HCT: 25 % — ABNORMAL LOW (ref 36.0–46.0)
Hemoglobin: 7.9 g/dL — ABNORMAL LOW (ref 12.0–15.0)
MCH: 28.8 pg (ref 26.0–34.0)
MCHC: 31.6 g/dL (ref 30.0–36.0)
MCV: 91.2 fL (ref 78.0–100.0)
Platelets: 570 10*3/uL — ABNORMAL HIGH (ref 150–400)
RBC: 2.74 MIL/uL — ABNORMAL LOW (ref 3.87–5.11)
RDW: 14.9 % (ref 11.5–15.5)
WBC: 10.1 10*3/uL (ref 4.0–10.5)

## 2016-12-30 MED ORDER — PHENOL 1.4 % MT LIQD: 1.0000 | OROMUCOSAL | Status: DC | PRN

## 2016-12-30 MED ORDER — ENOXAPARIN SODIUM 30 MG/0.3ML ~~LOC~~ SOLN
30.0000 mg | SUBCUTANEOUS | Status: DC
  Administered 2016-12-31 – 2017-01-12 (×13): 30 mg via SUBCUTANEOUS
  Filled 2016-12-30 (×13): qty 0.3

## 2016-12-30 MED ORDER — ENOXAPARIN SODIUM 30 MG/0.3ML ~~LOC~~ SOLN: 30.0000 mg | SUBCUTANEOUS | Status: DC

## 2016-12-30 MED ORDER — FENTANYL 12 MCG/HR TD PT72
12.5000 ug | MEDICATED_PATCH | TRANSDERMAL | Status: DC
  Administered 2016-12-31 – 2017-01-12 (×5): 12.5 ug via TRANSDERMAL
  Filled 2016-12-30 (×5): qty 1

## 2016-12-30 MED ORDER — BISACODYL 10 MG RE SUPP
10.0000 mg | Freq: Every day | RECTAL | Status: DC | PRN
Start: 2016-12-30 — End: 2017-01-13
  Administered 2017-01-01: 10 mg via RECTAL
  Filled 2016-12-30: qty 1

## 2016-12-30 MED ORDER — OXYCODONE HCL 5 MG PO TABS
5.0000 mg | ORAL_TABLET | ORAL | Status: DC | PRN
  Administered 2016-12-30 – 2016-12-31 (×4): 5 mg via ORAL
  Filled 2016-12-30 (×5): qty 1

## 2016-12-30 MED ORDER — CLOPIDOGREL BISULFATE 75 MG PO TABS
75.0000 mg | ORAL_TABLET | Freq: Every day | ORAL | Status: DC
  Administered 2016-12-31 – 2017-01-13 (×14): 75 mg via ORAL
  Filled 2016-12-30 (×14): qty 1

## 2016-12-30 MED ORDER — PROMETHAZINE HCL 25 MG/ML IJ SOLN
12.5000 mg | Freq: Four times a day (QID) | INTRAMUSCULAR | Status: DC | PRN
  Filled 2016-12-30: qty 1

## 2016-12-30 MED ORDER — SORBITOL 70 % SOLN: 30.0000 mL | Freq: Every day | Status: DC | PRN

## 2016-12-30 MED ORDER — HYDROCHLOROTHIAZIDE 10 MG/ML ORAL SUSPENSION
6.2500 mg | Freq: Every day | ORAL | Status: DC
  Administered 2016-12-31 – 2017-01-13 (×14): 6.25 mg via ORAL
  Filled 2016-12-30 (×16): qty 1.25

## 2016-12-30 MED ORDER — PROMETHAZINE HCL 12.5 MG PO TABS
12.5000 mg | ORAL_TABLET | Freq: Four times a day (QID) | ORAL | Status: DC | PRN
  Administered 2017-01-06: 12.5 mg via ORAL
  Filled 2016-12-30: qty 1

## 2016-12-30 MED ORDER — METOPROLOL TARTRATE 50 MG PO TABS
50.0000 mg | ORAL_TABLET | Freq: Every day | ORAL | Status: DC
  Administered 2016-12-31 – 2017-01-13 (×14): 50 mg via ORAL
  Filled 2016-12-30 (×14): qty 1

## 2016-12-30 MED ORDER — IRBESARTAN 75 MG PO TABS
75.0000 mg | ORAL_TABLET | Freq: Every day | ORAL | Status: DC
  Administered 2016-12-31 – 2017-01-09 (×10): 75 mg via ORAL
  Filled 2016-12-30 (×11): qty 1

## 2016-12-30 MED ORDER — DIAZEPAM 5 MG PO TABS
2.5000 mg | ORAL_TABLET | Freq: Every evening | ORAL | Status: DC | PRN
  Administered 2016-12-30 – 2017-01-11 (×10): 2.5 mg via ORAL
  Filled 2016-12-30 (×10): qty 1

## 2016-12-30 MED ORDER — PROMETHAZINE HCL 12.5 MG RE SUPP
12.5000 mg | Freq: Four times a day (QID) | RECTAL | Status: DC | PRN
  Filled 2016-12-30: qty 1

## 2016-12-30 MED ORDER — GUAIFENESIN-DM 100-10 MG/5ML PO SYRP: 15.0000 mL | ORAL_SOLUTION | ORAL | Status: DC | PRN

## 2016-12-30 MED ORDER — ACETAMINOPHEN 325 MG PO TABS
325.0000 mg | ORAL_TABLET | ORAL | Status: DC | PRN
  Administered 2016-12-30 – 2017-01-04 (×6): 650 mg via ORAL
  Administered 2017-01-05: 325 mg via ORAL
  Administered 2017-01-05 – 2017-01-07 (×6): 650 mg via ORAL
  Administered 2017-01-07 – 2017-01-09 (×7): 325 mg via ORAL
  Administered 2017-01-10: 650 mg via ORAL
  Administered 2017-01-10 (×2): 325 mg via ORAL
  Administered 2017-01-11 – 2017-01-13 (×6): 650 mg via ORAL
  Administered 2017-01-13: 325 mg via ORAL
  Filled 2016-12-30 (×6): qty 2
  Filled 2016-12-30: qty 1
  Filled 2016-12-30 (×2): qty 2
  Filled 2016-12-30: qty 1
  Filled 2016-12-30 (×3): qty 2
  Filled 2016-12-30 (×2): qty 1
  Filled 2016-12-30 (×2): qty 2
  Filled 2016-12-30: qty 1
  Filled 2016-12-30: qty 2
  Filled 2016-12-30: qty 1
  Filled 2016-12-30 (×6): qty 2
  Filled 2016-12-30: qty 1
  Filled 2016-12-30: qty 2
  Filled 2016-12-30: qty 1
  Filled 2016-12-30: qty 2

## 2016-12-30 MED ORDER — GABAPENTIN 300 MG PO CAPS
600.0000 mg | ORAL_CAPSULE | Freq: Three times a day (TID) | ORAL | Status: DC
  Administered 2016-12-30 – 2017-01-01 (×6): 600 mg via ORAL
  Filled 2016-12-30 (×6): qty 2

## 2016-12-30 MED ORDER — SENNA 8.6 MG PO TABS
1.0000 | ORAL_TABLET | Freq: Two times a day (BID) | ORAL | Status: DC
  Administered 2016-12-30 – 2017-01-13 (×15): 8.6 mg via ORAL
  Filled 2016-12-30 (×28): qty 1

## 2016-12-30 MED ORDER — PANTOPRAZOLE SODIUM 40 MG PO TBEC
40.0000 mg | DELAYED_RELEASE_TABLET | Freq: Every day | ORAL | Status: DC
  Administered 2016-12-31 – 2017-01-13 (×14): 40 mg via ORAL
  Filled 2016-12-30 (×14): qty 1

## 2016-12-30 MED ORDER — ENSURE ENLIVE PO LIQD: 237.0000 mL | Freq: Two times a day (BID) | ORAL | Status: DC

## 2016-12-30 MED ORDER — LOPERAMIDE HCL 2 MG PO CAPS: 2.0000 mg | ORAL_CAPSULE | ORAL | Status: DC | PRN

## 2016-12-30 MED ORDER — ADULT MULTIVITAMIN W/MINERALS CH
1.0000 | ORAL_TABLET | Freq: Every day | ORAL | Status: DC
  Administered 2016-12-31 – 2017-01-13 (×14): 1 via ORAL
  Filled 2016-12-30 (×14): qty 1

## 2016-12-30 MED ORDER — METOPROLOL TARTRATE 25 MG PO TABS
25.0000 mg | ORAL_TABLET | Freq: Every day | ORAL | Status: DC
  Administered 2016-12-30 – 2017-01-12 (×14): 25 mg via ORAL
  Filled 2016-12-30 (×14): qty 1

## 2016-12-30 NOTE — Progress Notes (Signed)
Family at bedside. Pt arrived at [place]. Oriented to room. Bed locked. Call bell in reach and taught how to use. Bed in low position. Limited movement in arms because of prior surgeries. Pt. Alert and oriented.

## 2016-12-30 NOTE — Progress Notes (Signed)
[facility] [place]  Subjective  - Had a good night and sat up some yesterday.   Objective (!) 150/54 69 98.4 F (36.9 C) (Oral) 16 95% No intake or output data in the 24 hours ending 12/30/16 0735   Right BKA sleeve in place and comfortable  Assessment/Planning: POD # 7 BKA  Pain is getting better.  She uses IV push occasionally Mobility is improving Pending CIR if bed available verse SNF.  Places they have chosen have no beds yet either. CBC and bmet ordered for the am  Laurence Slate [facility] 12/30/2016 7:35 AM --  Laboratory Lab Results: No results for input(s): WBC, HGB, HCT, PLT in the last 72 hours. BMET No results for input(s): NA, K, CL, CO2, GLUCOSE, BUN, CREATININE, CALCIUM in the last 72 hours.  COAG Lab Results  Component Value Date   INR 1.14 12/23/2016   INR 1.00 10/23/2016   INR 1.01 09/24/2016   No results found for: PTT

## 2016-12-30 NOTE — Progress Notes (Signed)
Patient was in better spirits today. Pain better controlled Accepted into CIR at [facility].  Will transfer today.  [name]

## 2016-12-30 NOTE — Care Management Note (Signed)
Case Management Note [name] RN, BSN Unit 2W-Case Manager [phone]  Patient Details  Name: NAKEYIA MARNER MRN: [id number] Date of Birth: 07-08-1928  Subjective/Objective:   Pt admitted s/p TMA                 Action/Plan: PTA pt lived at home- has supportive family- pt has pre-op referral to [facility]- order placed for HHPT- spoke with pt at bedside- discussed [employer] services- pt states she has had HH with [facility] in past and would like to continue services with them- notified Tiffany with [facility] of plan to return home with  Family and [facility] services. Pt would also like a rollator for home- will ask MD for DME order. Once order placed can have DME delivered to room prior to discharge.   Expected Discharge Date:    12/30/16              Expected Discharge Plan:  [place]  In-House Referral:  Clinical Social Work  Discharge planning Services  CM Consult  Post Acute Care Choice:  Durable Medical Equipment, Home Health Choice offered to:  Patient  DME Arranged:  [place] rolling with seat DME Agency:     HH Arranged:  PT [place]:  [place]  Status of Service:  Completed, signed off  If discussed at [place] of Stay Meetings, dates discussed:  1/30, 2/1, 2/6, 2/13  Additional Comments:  12/30/16- 1530- [name] RN, CM- per Genie with CIR- pt will have bed available in CIR today and can d/c later today to CIR- pt and family prefer CIR to SNF- CSW has been following for SNF as a backup if CIR did not have a bed available for d/c today. Plan will be to d/c to CIR at this time.  12/29/16- 1400- Nickey Canedo RN, CM- pt stable for d/c - now on po pain meds- hopeful for CIR- no beds available today- CSW working with pt and family for SNF back up plan if no CIR beds available tomorrow. .   12/23/16- 1600- Raford Brissett RN CM- pt now s/p BKA, CSW to follow for possible SNF need- vs CIR- have spoken with Genie from CIR who continues to  follow for medical readiness and tolerance of therapies. Pt will also need to be off IV pain meds for rehab.   12/18/16- 1430- [name] RN, CM- pt has had post op complications with pain and bleeding returned to OR for  I&D on 1/31-  CIR was consulted for possible admission- however doubtful pt can tolerate CIR therapies- PT/OT recommending SNF at this time-, CSW has been consulted for possible SNF placement- per [facility] mtg on 2/1- medical advisor made pt [place] with [facility] - pt would be a pass through on discharge from SNF, have notified Tiffany with [facility] of this.   Dawayne Elnora, RN 12/30/2016, 3:44 PM

## 2016-12-30 NOTE — H&P (Signed)
Physical Medicine and Rehabilitation Admission H&P    Chief complaint: Stump pain  HPI: [name] a 83 y.o.right handed femalewith history of right elbow replacement and atrial fibrillation maintained on Plavix and aspirin, GI bleed, chronic low back pain, fibromyalgia, CRI stage III creatinine 1.07-1.19, hypertension, peripheral vascular disease with history of femoral-popliteal bypass grafting 09/24/2016.Per chart review patient lives with spouse. Used a cane and/or walker when needed. One level home with. Daughter can assist.Presented 12/10/2016 with chronic right toe wound with increasing pain and ischemic changes followed by vascular surgery. No change with conservative care and underwent right transmetatarsal amputation 12/11/2016 per [name]. [facility] course pain management. Subcutaneous Lovenox for DVT prophylaxis. Acute on chronic anemia 7.9 and monitored. Progressive ischemic changes of right TMA with nonviable tissue surrounding incision. The calf was warm to touch and followed closely by vascular surgery. 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 Plavix resumed as per cardiology services. Palliative care has been following for goals of care. Therapies initiated with slow progressive gains. Recommendations for physical medicine rehabilitation consult. Patient was admitted for a comprehensive rehabilitation program.  Review of Systems  Constitutional: Negative for chills and fever.  HENT: Negative for hearing loss.   Eyes: Negative for blurred vision and double vision.  Respiratory: Positive for shortness of breath. Negative for cough.   Cardiovascular: Positive for palpitations and leg swelling.  Gastrointestinal: Positive for constipation. Negative for nausea and vomiting.       GERD  Genitourinary: Negative for dysuria, flank pain and hematuria.  Musculoskeletal: Positive for falls and myalgias.  Skin: Negative for rash.  Neurological: Positive  for dizziness. Negative for seizures.  All other systems reviewed and are negative.      Past Medical History:  Diagnosis Date  . Anemia   . Anginal pain ([place])   . Arthritis    "qwhere" (09/05/2016)  . Atrial fibrillation ([place]) 09/2016  . Chronic lower back pain   . Complication of anesthesia    "takes a long time for it to wear off; I can hallucinate if I take too much" (09/05/2016)  . DVT (deep venous thrombosis) ([place]) 10/2009  . Fall from steps 08/31/2013   Fx. pelvis, Left Hip, Left Elbow  . Fibromyalgia   . GERD (gastroesophageal reflux disease)    09/22/16- "no too much anymore"  . GI bleed 10/24/2016  . High cholesterol   . History of blood transfusion   . History of hiatal hernia   . Hypertension   . Macular degeneration, wet ([place])    "started in right eye; now legally blind in that eye; now started in left eye but pretty much in control" (09/05/2016)  . Osteoporosis   . Peripheral vascular disease ([place])    nonviable tissue Right foot  . PONV (postoperative nausea and vomiting)   . Squamous cell carcinoma of skin of right calf Aug. 2015  . Stroke ([place])    TIA's no residual  . TIA (transient ischemic attack)    "several at once; none in a long time" (09/05/2016)        Past Surgical History:  Procedure Laterality Date  . ABDOMINAL AORTAGRAM N/A 12/26/2014   Procedure: ABDOMINAL Maxcine Ham;  Surgeon: Serafina Mitchell, MD;  Location: [facility] CATH LAB;  Service: Cardiovascular;  Laterality: N/A;  . AMPUTATION Right 12/23/2016   Procedure: AMPUTATION BELOW KNEE;  Surgeon: Serafina Mitchell, MD;  Location: [place];  Service: Vascular;  Laterality: Right;  . AORTOGRAM N/A 09/26/2016  Procedure: AORTOGRAM;  Surgeon: Waynetta Sandy, MD;  Location: [place];  Service: Vascular;  Laterality: N/A;  . CARPAL TUNNEL RELEASE Right   . CATARACT EXTRACTION W/ INTRAOCULAR LENS  IMPLANT, BILATERAL Bilateral   . COLONOSCOPY    . DILATION AND CURETTAGE OF  UTERUS    . EYE SURGERY Right    "macular OR"  . FEMORAL ARTERY STENT  12-11-10   Left SFA  . FEMORAL-POPLITEAL BYPASS GRAFT Left 09/24/2016   Procedure: REDO FEMORAL TO POPLITEAL ARTERY BYPASS GRAFT USING 6MM PROPATEN RINGED GORTEX GRAFT;  Surgeon: Serafina Mitchell, MD;  Location: [place];  Service: Vascular;  Laterality: Left;  . I&D EXTREMITY Right 12/17/2016   Procedure: IRRIGATION AND DEBRIDEMENT RIGHT FOOT;  Surgeon: Serafina Mitchell, MD;  Location: [place];  Service: Vascular;  Laterality: Right;  . INCISION AND DRAINAGE OF WOUND Left 10/25/2009   leg/notes 11/13/2009  . INSERTION OF ILIAC STENT Left 12/26/2014   Procedure: INSERTION OF ILIAC STENT;  Surgeon: Serafina Mitchell, MD;  Location: [facility] CATH LAB;  Service: Cardiovascular;  Laterality: Left;  . INSERTION OF ILIAC STENT Left 09/26/2016   Procedure: SUB INTIMAL INSERTION OF SUPERFICIAL FEMORAL ARTERY AND BELOW KNEE BYPASS GRAFT;  Surgeon: Waynetta Sandy, MD;  Location: [place];  Service: Vascular;  Laterality: Left;  . JOINT REPLACEMENT     knee  . JOINT REPLACEMENT Left Oct. 17, 2014   Elbow ( pt fell 08-31-13 )  . ORIF SHOULDER FRACTURE Right    "it was crushed"  . PERIPHERAL VASCULAR CATHETERIZATION N/A 10/30/2015   Procedure: Abdominal Aortogram w/Lower Extremity;  Surgeon: Serafina Mitchell, MD;  Location: [place] CV LAB;  Service: Cardiovascular;  Laterality: N/A;  . PERIPHERAL VASCULAR CATHETERIZATION  10/30/2015   Procedure: Peripheral Vascular Intervention;  Surgeon: Serafina Mitchell, MD;  Location: [place] CV LAB;  Service: Cardiovascular;;  . PERIPHERAL VASCULAR CATHETERIZATION N/A 04/01/2016   Procedure: Abdominal Aortogram w/Lower Extremity;  Surgeon: Serafina Mitchell, MD;  Location: [place] CV LAB;  Service: Cardiovascular;  Laterality: N/A;  . PERIPHERAL VASCULAR CATHETERIZATION Left 04/01/2016   Procedure: Peripheral Vascular Atherectomy;  Surgeon: Serafina Mitchell, MD;  Location: [place] CV LAB;  Service: Cardiovascular;  Laterality: Left;  Superficial femoral artery.  Marland Kitchen PERIPHERAL VASCULAR CATHETERIZATION Right 09/05/2016   "stent"  . PERIPHERAL VASCULAR CATHETERIZATION N/A 09/05/2016   Procedure: Abdominal Aortogram w/Lower Extremity;  Surgeon: Serafina Mitchell, MD;  Location: [place] CV LAB;  Service: Cardiovascular;  Laterality: N/A;  . PERIPHERAL VASCULAR CATHETERIZATION Right 09/05/2016   Procedure: Peripheral Vascular Intervention;  Surgeon: Serafina Mitchell, MD;  Location: [place] CV LAB;  Service: Cardiovascular;  Laterality: Right;  Superficial Femoral  . PERIPHERAL VASCULAR CATHETERIZATION Left 09/09/2016   Procedure: Lower Extremity Angiography;  Surgeon: Serafina Mitchell, MD;  Location: [place] CV LAB;  Service: Cardiovascular;  Laterality: Left;  . PR VEIN BYPASS GRAFT,AORTO-FEM-POP  09-13-09   Left Fem-pop  . THROMBECTOMY FEMORAL ARTERY Right 09/26/2016   Procedure: Thromboembolectomy Right Lower Extremity, Right Femoral Artery Endarterectomy with Patch Angioplasty; Right Lower Extremity Angiogram ;  Surgeon: Waynetta Sandy, MD;  Location: [place];  Service: Vascular;  Laterality: Right;  . TOTAL ELBOW ARTHROPLASTY Left 09/03/2013   Procedure: LEFT TOTAL ELBOW ARTHROPLASTY;  Surgeon: Roseanne Kaufman, MD;  Location: [place];  Service: Orthopedics;  Laterality: Left;  . TOTAL KNEE ARTHROPLASTY Left 06/2006  . TRANSMETATARSAL AMPUTATION Right 12/11/2016   Procedure: TRANSMETATARSAL AMPUTATION;  Surgeon: Serafina Mitchell, MD;  Location: [place];  Service: Vascular;  Laterality: Right;  . TUBAL LIGATION    . VAGINAL HYSTERECTOMY           Family History  Problem Relation Age of Onset  . Heart disease Father     Heart Disease before age 80  . Hyperlipidemia Father   . Hypertension Father   . Alcohol abuse Father   . Heart disease Brother   . Hyperlipidemia Brother   . Hypertension Brother   . Deep vein thrombosis  Brother   . Heart disease Son     Heart Disease before age 63  . Hyperlipidemia Son   . Hypertension Son   . Heart attack Son   . Diabetes Son   . Hypertension Son   . Hyperlipidemia Sister   . Hypertension Sister    Social History:  reports that she quit smoking about 70 years ago. Her smoking use included Cigarettes. She has never used smokeless tobacco. She reports that she does not drink alcohol or use drugs. Allergies:       Allergies  Allergen Reactions  . Motrin [Ibuprofen] Other (See Comments)    ADVERSE REACTION - GI BLEED  . Statins Other (See Comments)    ADVERSE REACTION MUSCLE PAIN & WEAKNESS  . Morphine And Related Other (See Comments)    HALLUCINATIONS REACTION IS SIDE EFFECT  . Oxycontin [Oxycodone Hcl] Other (See Comments)    [REACTION IS SIDE EFFECT]  Hallinatations  . Promethazine Hcl Other (See Comments)    IV  Drug only, makes her act crazy  . Sulfa Antibiotics Nausea And Vomiting         Medications Prior to Admission  Medication Sig Dispense Refill  . acetaminophen (TYLENOL) 500 MG tablet Take 1,000 mg by mouth 2 (two) times daily as needed for moderate pain or headache. Patient took this medication for her pain.    . cephALEXin (KEFLEX) 500 MG capsule Take 1 capsule (500 mg total) by mouth 3 (three) times daily. 30 capsule 0  . Cholecalciferol (VITAMIN D3) 2000 UNITS TABS Take 2,000 Units by mouth at bedtime.     . clopidogrel (PLAVIX) 75 MG tablet Take 75 mg by mouth daily.    . Cyanocobalamin (VITAMIN B-12 PO) Take 1 tablet by mouth daily.    . diazepam (VALIUM) 5 MG tablet Take 2.5 mg by mouth at bedtime as needed for anxiety.     . gabapentin (NEURONTIN) 300 MG capsule Take 2 capsules (600 mg total) by mouth 2 (two) times daily. 120 capsule 3  . HYDROcodone-acetaminophen (NORCO/VICODIN) 5-325 MG tablet Take 1-2 tablets by mouth every 4 (four) hours as needed for moderate pain. 30 tablet 0  . metoprolol (LOPRESSOR)  50 MG tablet Take 1 tablet (50 mg total) by mouth 2 (two) times daily. Take 50mg  by mouth in the morning and take 25mg  by mouth at bedtime. (Patient taking differently: Take 50 mg by mouth 2 (two) times daily. ) 60 tablet 0  . Multiple Vitamins-Minerals (EYE VITAMINS PO) Take 1 tablet by mouth at bedtime.     . pantoprazole (PROTONIX) 40 MG tablet Take 1 tablet (40 mg total) by mouth daily. 30 tablet 1  . valsartan-hydrochlorothiazide (DIOVAN-HCT) 160-12.5 MG per tablet Take 0.5 tablets by mouth daily.    Marland Kitchen loperamide (IMODIUM) 2 MG capsule Take 2 mg by mouth as needed for diarrhea or loose stools.      Home: Home Living Family/patient expects to be  discharged to:: Inpatient rehab Living Arrangements: Spouse/significant other Available Help at Discharge: Family, Available 24 hours/day Type of Home: House Home Access: Ramped entrance Home Layout: One level Bathroom Shower/Tub: Multimedia programmer: Standard Bathroom Accessibility: Yes Home Equipment: Environmental consultant - 2 wheels, Bedside commode, Cane - single point, Grab bars - tub/shower, Wheelchair - manual Additional Comments: pt family is planning for 24/7 supervision   Functional History: Prior Function Level of Independence: Needs assistance Gait / Transfers Assistance Needed: RW with some family help recently ADL's / Homemaking Assistance Needed: daughter assists with shower transfers and ADL as needed  Functional Status:  Mobility: Bed Mobility Overal bed mobility: Needs Assistance Bed Mobility: Sit to Supine Rolling: Min assist Sidelying to sit: Mod assist Supine to sit: Min assist Sit to supine: Mod assist General bed mobility comments: pt able to scoot self back on bed and manage both LEs back into bed with minA Transfers Overall transfer level: Needs assistance Equipment used:  (1 person lift with gait belt) Transfer via Lift Equipment:  (maximove pad underneath pt in chair for back to bed) Transfers: Sit  to/from Stand, Technical brewer Transfers Sit to Stand: Max assist (or modAx2) Stand pivot transfers: Max assist (or modAx2) General transfer comment: max directional v/c's to hold onto PT to prevent pt from reaching impulsively and retropulsion. unable to achieve L knee terminal extension, knee and foot blocked. pt c/o L LE pain and fear of falling. completed with 1 person as pt impulsive and can't focus, easier to keep pt on task and elimate impulsivity Ambulation/Gait General Gait Details: unable  ADL: ADL Overall ADL's : Needs assistance/impaired Eating/Feeding: Set up, Sitting Grooming: Set up, Supervision/safety, Bed level Upper Body Bathing: Minimal assistance, Sitting Lower Body Bathing: Maximal assistance, Bed level Upper Body Dressing : Min guard, Sitting Lower Body Dressing: Total assistance Lower Body Dressing Details (indicate cue type and reason): to don L sock at bed level Toilet Transfer: Maximal assistance, +2 for physical assistance, Stand-pivot Toilet Transfer Details (indicate cue type and reason): Simulated by stand pivot to chair Toileting- Clothing Manipulation and Hygiene: +2 for physical assistance, Maximal assistance, +2 for safety/equipment, Bed level Functional mobility during ADLs: Maximal assistance, +2 for physical assistance (for stand pivot only) General ADL Comments: Pt seen for ADL retraining session this morning with focus on bed mobility, sitting EOB/balance x50min at supervision level after sitting EOB to encourange increased activity tolerance. Pt requesting pain medication at begining of OT, RN staff made aware. After ~6-26min, pt requesting getting back to bed reporting that stump begins to throb and "I get lightheaded". Pt is a good candidate for CIR Rehab is hoepful to go later today if medically able. Pt/family were educated verbally in transfers (SPT vs sliding board) and pt is agreeable to this when she gets to Rehab.  Need to update acute OT  goals.  Cognition: Cognition Overall Cognitive Status: Within Functional Limits for tasks assessed Orientation Level: Oriented X4 Cognition Arousal/Alertness: Awake/alert Behavior During Therapy: Anxious (falling and LE pain with mobility) Overall Cognitive Status: Within Functional Limits for tasks assessed General Comments: Mildly confused at times. Has been using PCA and IV pain medication today.  Physical Exam: Blood pressure (!) 150/54, pulse 69, temperature 98.4 F (36.9 C), temperature source Oral, resp. rate 16, height 5\' 2"  (1.575 m), weight 48.2 kg (106 lb 3.2 oz), SpO2 95 %. Physical Exam  HENT:  Head: Normocephalic.  Eyes: EOM are normal. Left eye exhibits no discharge.  Neck: Normal range of  motion. Neck supple. No thyromegaly present.  Cardiovascular: Exam reveals no gallop and no friction rub.   No murmur heard. Cardiac rate controlled  Respiratory: Effort normal and breath sounds normal. No respiratory distress. She has no wheezes.  GI: Soft. Bowel sounds are normal. She exhibits no distension. There is no tenderness.  Psychiatric: She has a normal mood and affect. Her behavior is normal.  Skin. BKA incision intact and well approximated. Mild sersosang drainage along right lateral aspect Musculoskeletal. Arthritic changes in both hands. Right BK edematous and sensitive to touch. Able to fully extend the knee while supine.  Neurological: She is oriented to person, place, and time. No cranial nerve deficit.  Mood is a bit flat but appropriate. She makes good eye contact with examiner. Intrinsic minus left foot. Stocking glove sensory loss up to left knee and to a lesser extent in the hands. UE grossly 4-5/5 with some weakness in both hands. LLE grossly 3-4/5 proximal to distal      Medical Problem List and Plan: 1.  Decreased functional mobility secondary to right BKA 12/23/2016             -admit to inpatient rehab 2.  DVT Prophylaxis/Anticoagulation:  Subcutaneous Lovenox. Monitor platelet counts and any signs of bleeding 3. Pain Management/fibromyalgia: Neurontin 600 mg 3 times a day, Duragesic patch 12.5 g, oxycodone as needed 4. Mood: Valium 2.5 mg daily at bedtime as needed anxiety 5. Neuropsych: This patient is capable of making decisions on her own behalf. 6. Skin/Wound Care: Routine skin checks 7. Fluids/Electrolytes/Nutrition: Routine I&O with follow-up chemistries upon admit 8. Acute blood loss anemia. Follow-up CBC 9. Atrial fibrillation. Lopressor 50 mg daily and 25 mg daily at bedtime. Cardiac rate control. Follow-up per cardiology services 10. Hypertension. Avapro 75 mg daily, HCTZ 6.25 mg daily. Monitor with increased mobility 11. CRI stage III. Creatinine baseline 1.07-1.19. Follow-up chemistries 12. History of GI bleed. Continue Protonix 13. Constipation. Laxative assistance 14. Decreased nutritional storage. Dietary follow-up 15. Hx of right elbow replacement 2015---5 lb weight limit through elbow    Post Admission Physician Evaluation: 1. Functional deficits secondary  to right BKA. 2. Patient is admitted to receive collaborative, interdisciplinary care between the physiatrist, rehab nursing staff, and therapy team. 3. Patient's level of medical complexity and substantial therapy needs in context of that medical necessity cannot be provided at a lesser intensity of care such as a SNF. 4. Patient has experienced substantial functional loss from his/her baseline which was documented above under the "Functional History" and "Functional Status" headings.  Judging by the patient's diagnosis, physical exam, and functional history, the patient has potential for functional progress which will result in measurable gains while on inpatient rehab.  These gains will be of substantial and practical use upon discharge  in facilitating mobility and self-care at the household level. 5. Physiatrist will provide 24 hour management of  medical needs as well as oversight of the therapy plan/treatment and provide guidance as appropriate regarding the interaction of the two. 6. The Preadmission Screening has been reviewed and patient status is unchanged unless otherwise stated above. 7. 24 hour rehab nursing will assist with bladder management, bowel management, safety, skin/wound care, disease management, medication administration, pain management and patient education  and help integrate therapy concepts, techniques,education, etc. 8. PT will assess and treat for/with: Lower extremity strength, range of motion, stamina, balance, functional mobility, safety, adaptive techniques and equipment, pain mgt, pre-prosthetic education, family ed.   Goals are: supervision to min  assist at w/c level. 9. OT will assess and treat for/with: ADL's, functional mobility, safety, upper extremity strength, adaptive techniques and equipment, pain mgt, pre-prosthetic education, right elbow precatuions.   Goals are: mod I to min assist. Therapy may not yet proceed with showering this patient. 10. SLP will assess and treat for/with: n/a.  Goals are: n/a. 11. Case Management and Social Worker will assess and treat for psychological issues and discharge planning. 12. Team conference will be held weekly to assess progress toward goals and to determine barriers to discharge. 13. Patient will receive at least 3 hours of therapy per day at least 5 days per week. 14. ELOS: 13-16 days       15. Prognosis:  excellent     Meredith Staggers, MD, [place] [facility] 12/30/2016  Cathlyn Parsons., PA-C 12/30/2016

## 2016-12-30 NOTE — PMR Pre-admission (Signed)
PMR Admission Coordinator Pre-Admission Assessment  Patient: [name] is an 83 y.o., female MRN: [id number] DOB: 24-Dec-1927 Height: 5\' 2"  (157.5 cm) Weight: 48.2 kg (106 lb 3.2 oz)              Insurance Information HMO:No    PPO:       PCP:       IPA:       80/20:       OTHER:   PRIMARY:  Medicare A/B      Policy#:  [id number]      Subscriber:  Barry Brunner CM Name:        Phone#:       Fax#:   Pre-Cert#:        Employer:  Retired Benefits:  Phone #:       Name: Checked in [place]. Date: 09/18/95     Deduct:  $1340      Out of Pocket Max: none      Life Max: unlimited CIR: 100%      SNF: 100 days Outpatient: 80%     Co-Pay: 20% Home Health: 100%      Co-Pay: none DME: 80%     Co-Pay: 20% Providers: patient's choice  SECONDARY:  BCBS supplement      Policy#: [id number]      Subscriber:  Barry Brunner CM Name:        Phone#:       Fax#:   Pre-Cert#:        Employer:  Retired Benefits:  Phone #: [phone]     Name:   Eff. Date: 11/17/10      Deduct:        Out of Pocket Max:        Life Max:   CIR:        SNF:   Outpatient:       Co-Pay:   Home Health:        Co-Pay:   DME:       Co-Pay:    Medicaid Application Date:        Case Manager:   Disability Application Date:        Case Worker:    Emergency Contact Information Contact Information    Name Relation Home Work [place] Daughter   [phone]   [name], [name] [phone]       Current Medical History  Patient Admitting Diagnosis: R BKA  History of Present Illness: An 83 y.o.right handed femalewith history of atrial fibrillation maintained on Plavix and aspirin, GI bleed, chronic low back pain, fibromyalgia, CRI stage III creatinine 1.07-1.19, hypertension, peripheral vascular disease with history of femoral-popliteal bypass grafting 09/24/2016.Per chart review patient lives with spouse. Used a cane and/or walker when needed. One level home with. Daughter can  assist.Presented 12/10/2016 with chronic right toe wound with increasing pain and ischemic changes followed by vascular surgery. No change with conservative care and underwent right transmetatarsal amputation 12/11/2016 per [name]. [facility] course pain management. Subcutaneous Lovenox for DVT prophylaxis. Acute on chronic anemia 7.9 and monitored. Progressive ischemic changes of right TMA with nonviable tissue surrounding incision. The calf was warm to touch and followed closely by vascular surgery. 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 Plavix resumed as per cardiology services. Palliative care has been following for goals of care. Therapies initiated with slow progressive gains. Recommendations for physical medicine rehabilitation consult. Patient to be admitted for a comprehensive inpatient rehabilitation program.  Past Medical History  Past Medical History:  Diagnosis Date  . Anemia   . Anginal pain ([place])   . Arthritis    "qwhere" (09/05/2016)  . Atrial fibrillation ([place]) 09/2016  . Chronic lower back pain   . Complication of anesthesia    "takes a long time for it to wear off; I can hallucinate if I take too much" (09/05/2016)  . DVT (deep venous thrombosis) ([place]) 10/2009  . Fall from steps 08/31/2013   Fx. pelvis, Left Hip, Left Elbow  . Fibromyalgia   . GERD (gastroesophageal reflux disease)    09/22/16- "no too much anymore"  . GI bleed 10/24/2016  . High cholesterol   . History of blood transfusion   . History of hiatal hernia   . Hypertension   . Macular degeneration, wet ([place])    "started in right eye; now legally blind in that eye; now started in left eye but pretty much in control" (09/05/2016)  . Osteoporosis   . Peripheral vascular disease ([place])    nonviable tissue Right foot  . PONV (postoperative nausea and vomiting)   . Squamous cell carcinoma of skin of right calf Aug. 2015  . Stroke ([place])    TIA's no residual  . TIA (transient ischemic  attack)    "several at once; none in a long time" (09/05/2016)    Family History  family history includes Alcohol abuse in her father; Deep vein thrombosis in her brother; Diabetes in her son; Heart attack in her son; Heart disease in her brother, father, and son; Hyperlipidemia in her brother, father, sister, and son; Hypertension in her brother, father, sister, son, and son.  Prior Rehab/Hospitalizations: Had [facility] care from [facility] in 11/17 after bypass surgery.  Has the patient had major surgery during 100 days prior to admission? Yes.  Had surgery 3 times in 11/17 for bypass to right foot.  Current Medications   Current Facility-Administered Medications:  .  0.9 %  sodium chloride infusion, , Intravenous, Continuous, Waynetta Sandy, MD, Last Rate: 10 mL/hr at 12/25/16 2119 .  acetaminophen (TYLENOL) tablet 1,000 mg, 1,000 mg, Oral, BID PRN, Ulyses Amor, PA-C, 1,000 mg at 12/24/16 1101 .  alum & mag hydroxide-simeth (MAALOX/MYLANTA) 200-200-20 MG/5ML suspension 15-30 mL, 15-30 mL, Oral, Q2H PRN, Ulyses Amor, PA-C .  bisacodyl (DULCOLAX) suppository 10 mg, 10 mg, Rectal, Daily PRN, Loistine Chance, MD .  clopidogrel (PLAVIX) tablet 75 mg, 75 mg, Oral, Daily, Ulyses Amor, PA-C, 75 mg at 12/30/16 0943 .  diazepam (VALIUM) tablet 2.5 mg, 2.5 mg, Oral, QHS PRN, Ulyses Amor, PA-C, 2.5 mg at 12/28/16 0035 .  enoxaparin (LOVENOX) injection 30 mg, 30 mg, Subcutaneous, Q24H, Emma M Collins, PA-C, 30 mg at 12/30/16 1250 .  feeding supplement (ENSURE ENLIVE) (ENSURE ENLIVE) liquid 237 mL, 237 mL, Oral, BID BM, Serafina Mitchell, MD, 237 mL at 12/30/16 1000 .  fentaNYL (DURAGESIC - dosed mcg/hr) 12.5 mcg, 12.5 mcg, Transdermal, Q72H, Loistine Chance, MD, 12.5 mcg at 12/28/16 1030 .  gabapentin (NEURONTIN) capsule 600 mg, 600 mg, Oral, TID, Ulyses Amor, PA-C, 600 mg at 12/30/16 0944 .  guaiFENesin-dextromethorphan (ROBITUSSIN DM) 100-10 MG/5ML syrup 15 mL, 15 mL, Oral, Q4H PRN, Ulyses Amor, PA-C .  hydrochlorothiazide 10 mg/mL oral suspension 6.25 mg, 6.25 mg, Oral, Daily, Ulyses Amor, PA-C, 6.25 mg at 12/30/16 0944 .  HYDROmorphone (DILAUDID) injection 0.5 mg, 0.5 mg, Intravenous, Q2H PRN, Micheline Rough, MD, 0.5 mg at 12/29/16  1127 .  irbesartan (AVAPRO) tablet 75 mg, 75 mg, Oral, Daily, 75 mg at 12/30/16 0943 **AND** [DISCONTINUED] hydrochlorothiazide 10 mg/mL oral suspension 6.25 mg, 6.25 mg, Oral, Daily, Skeet Simmer, RPH, 6.25 mg at 12/14/16 1200 .  labetalol (NORMODYNE,TRANDATE) injection 10 mg, 10 mg, Intravenous, Q10 min PRN, Ulyses Amor, PA-C, 10 mg at 12/19/16 0056 .  lactated ringers infusion, , Intravenous, Continuous, Roberts Gaudy, MD, Last Rate: 50 mL/hr at 12/17/16 1032 .  lactated ringers infusion, , Intravenous, Continuous, Oleta Mouse, MD, Stopped at 12/23/16 1234 .  loperamide (IMODIUM) capsule 2 mg, 2 mg, Oral, PRN, Ulyses Amor, PA-C .  magnesium sulfate IVPB 2 g 50 mL, 2 g, Intravenous, Daily PRN, Ulyses Amor, PA-C .  metoprolol (LOPRESSOR) injection 2-5 mg, 2-5 mg, Intravenous, Q2H PRN, Ulyses Amor, PA-C .  metoprolol (LOPRESSOR) tablet 50 mg, 50 mg, Oral, Daily, Ulyses Amor, PA-C, 50 mg at 12/30/16 0944 .  metoprolol tartrate (LOPRESSOR) tablet 25 mg, 25 mg, Oral, QHS, Skeet Simmer, RPH, 25 mg at 12/29/16 2104 .  multivitamin with minerals tablet 1 tablet, 1 tablet, Oral, Daily, Serafina Mitchell, MD, 1 tablet at 12/30/16 [phone] .  ondansetron (ZOFRAN) injection 4 mg, 4 mg, Intravenous, Q6H PRN, Ulyses Amor, PA-C .  oxyCODONE (Oxy IR/ROXICODONE) immediate release tablet 5 mg, 5 mg, Oral, Q2H PRN, Loistine Chance, MD, 5 mg at 12/30/16 1250 .  pantoprazole (PROTONIX) EC tablet 40 mg, 40 mg, Oral, Daily, Ulyses Amor, PA-C, 40 mg at 12/30/16 0944 .  phenol (CHLORASEPTIC) mouth spray 1 spray, 1 spray, Mouth/Throat, PRN, Ulyses Amor, PA-C .  senna (SENOKOT) tablet 8.6 mg, 1 tablet, Oral, BID, Micheline Rough, MD, 8.6 mg at 12/30/16  0943 .  silver nitrate applicators applicator 5 Stick, 5 Stick, Topical, PRN, Ulyses Amor, PA-C, 5 Stick at 12/13/16 [phone]  Patients Current Diet: Diet regular Room service appropriate? Yes; Fluid consistency: Thin  Precautions / Restrictions Precautions Precautions: Fall, Other (comment) Precaution Comments: daughter reports pt not to push or pull with Lt elbow more than 5 lbs (s/p total elbow replacement)  Other Brace/Splint: R darco shoe Restrictions Weight Bearing Restrictions: Yes RLE Weight Bearing: Non weight bearing Other Position/Activity Restrictions: WB on heel R foot only in Darco shoe   Has the patient had 2 or more falls or a fall with injury in the past year?No  Prior Activity Level Limited Community (1-2x/wk): Went to church and went out to get her hair done weekly.  Home Assistive Devices / Equipment Home Assistive Devices/Equipment: Radio producer (specify quad or straight), Wheelchair, Environmental consultant (specify type), Eyeglasses, Bedside commode/3-in-1 Home Equipment: Walker - 2 wheels, Bedside commode, Cane - single point, Grab bars - tub/shower, Wheelchair - manual  Prior Device Use: Indicate devices/aids used by the patient prior to current illness, exacerbation or injury? Manual wheelchair and Walker  Prior Functional Level Prior Function Level of Independence: Needs assistance Gait / Transfers Assistance Needed: RW with some family help recently ADL's / Homemaking Assistance Needed: daughter assists with shower transfers and ADL as needed  Self Care: Did the patient need help bathing, dressing, using the toilet or eating?  Independent  Indoor Mobility: Did the patient need assistance with walking from room to room (with or without device)? Independent  Stairs: Did the patient need assistance with internal or external stairs (with or without device)? Independent  Functional Cognition: Did the patient need help planning regular tasks such as shopping or remembering to take  medications? Independent  Current Functional Level Cognition  Overall Cognitive Status: Within Functional Limits for tasks assessed Orientation Level: Oriented X4 General Comments: Mildly confused at times. Has been using PCA and IV pain medication today.    Extremity Assessment (includes Sensation/Coordination)  Upper Extremity Assessment:  (see initial eval, pt with bilat limited function)  Lower Extremity Assessment: LLE deficits/detail LLE Deficits / Details: grossly 3-/5    ADLs  Overall ADL's : Needs assistance/impaired Eating/Feeding: Set up, Sitting Grooming: Set up, Supervision/safety, Bed level Upper Body Bathing: Minimal assistance, Sitting Lower Body Bathing: Maximal assistance, Bed level Upper Body Dressing : Min guard, Sitting Lower Body Dressing: Total assistance Lower Body Dressing Details (indicate cue type and reason): to don L sock at bed level Toilet Transfer: Maximal assistance, +2 for physical assistance, Stand-pivot Toilet Transfer Details (indicate cue type and reason): Simulated by stand pivot to chair Toileting- Clothing Manipulation and Hygiene: +2 for physical assistance, Maximal assistance, +2 for safety/equipment, Bed level Functional mobility during ADLs: Maximal assistance, +2 for physical assistance (for stand pivot only) General ADL Comments: Pt seen for ADL retraining session this morning with focus on bed mobility, sitting EOB/balance x17min at supervision level after sitting EOB to encourange increased activity tolerance. Pt requesting pain medication at begining of OT, RN staff made aware. After ~6-73min, pt requesting getting back to bed reporting that stump begins to throb and "I get lightheaded". Pt is a good candidate for CIR Rehab is hoepful to go later today if medically able. Pt/family were educated verbally in transfers (SPT vs sliding board) and pt is agreeable to this when she gets to Rehab.  Need to update acute OT goals.    Mobility   Overal bed mobility: Needs Assistance Bed Mobility: Sit to Supine Rolling: Min assist Sidelying to sit: Mod assist Supine to sit: Min assist Sit to supine: Mod assist General bed mobility comments: pt able to scoot self back on bed and manage both LEs back into bed with minA    Transfers  Overall transfer level: Needs assistance Equipment used:  (1 person lift with gait belt) Transfer via Lift Equipment:  (maximove pad underneath pt in chair for back to bed) Transfers: Sit to/from Stand, Technical brewer Transfers Sit to Stand: Max assist (or modAx2) Stand pivot transfers: Max assist (or modAx2) General transfer comment: max directional v/c's to hold onto PT to prevent pt from reaching impulsively and retropulsion. unable to achieve L knee terminal extension, knee and foot blocked. pt c/o L LE pain and fear of falling. completed with 1 person as pt impulsive and can't focus, easier to keep pt on task and elimate impulsivity    Ambulation / Gait / Stairs / Wheelchair Mobility  Ambulation/Gait General Gait Details: unable    Posture / Balance Balance Overall balance assessment: Needs assistance Sitting-balance support: No upper extremity supported Sitting balance-Leahy Scale: Fair Postural control: Posterior lean Standing balance support: Bilateral upper extremity supported Standing balance-Leahy Scale: Zero Standing balance comment: requires physical assist, unable to tolerate R LE WBing    Special needs/care consideration BiPAP/CPAP No CPM No Continuous Drip IV No Dialysis No         Life Vest No Oxygen No Special Bed No Trach Size No Wound Vac (area) No      Skin Surgical right BKA site with dressing.  Has buttocks breakdown.  Bowel mgmt: Last BM 12/30/16 Bladder mgmt: Voiding on bedpan and on [facility] with assistance Diabetic mgmt No    Previous Home Environment Living Arrangements: Spouse/significant other Available Help at Discharge: Family,  Available 24 hours/day Type of Home: House Home Layout: One level Home Access: Ramped entrance Bathroom Shower/Tub: Multimedia programmer: Standard Bathroom Accessibility: Yes How Accessible: Accessible via wheelchair Spivey: No Additional Comments: pt family is planning for 24/7 supervision  Discharge Living Setting Plans for Discharge Living Setting: Patient's home, House, Lives with (comment) (Lives with husband.) Type of Home at Discharge: House Discharge Home Layout: One level Discharge Home Access: [place] entrance Does the patient have any problems obtaining your medications?: No  Social/Family/Support Systems Patient Roles: Spouse, Parent (Has a spouse and a daughter.) Contact Information: Aldea Kishel - daughter Anticipated Caregiver: Daughter and husband Anticipated Caregiver's Contact Information: [name] - daughter - [phone]  Ability/Limitations of Caregiver: Husband is retired and can provide supervision but not physical lifting.  Daughter works at the outpatient surgical center but can assist when not working. Caregiver Availability: 24/7 Discharge Plan Discussed with Primary Caregiver: Yes Is Caregiver In Agreement with Plan?: Yes Does Caregiver/Family have Issues with Lodging/Transportation while Pt is in Rehab?: No  Goals/Additional Needs Patient/Family Goal for Rehab: PT mod I, OT mod I and supervision goals Expected length of stay: 7-10 days Cultural Considerations: [employer] Dietary Needs: Regular diet, thin liquids Equipment Needs: TBD Pt/Family Agrees to Admission and willing to participate: Yes Program Orientation Provided & Reviewed with Pt/Caregiver Including Roles  & Responsibilities: Yes  Decrease burden of Care through IP rehab admission: N/A  Possible need for SNF placement upon discharge: Not anticipated  Patient Condition: This patient's medical and functional status has changed since the consult dated: 12/12/16 in which the  Rehabilitation Physician determined and documented that the patient's condition is appropriate for intensive rehabilitative care in an inpatient rehabilitation facility. See "History of Present Illness" (above) for medical update. Functional changes are: Currently requiring mod to max assist for transfers. Patient's medical and functional status update has been discussed with the Rehabilitation physician and patient remains appropriate for inpatient rehabilitation. Will admit to inpatient rehab today.  Preadmission Screen Completed By:  Retta Diones, 12/30/2016 2:52 PM ______________________________________________________________________   Discussed status with Dr. Naaman Plummer on 12/30/16 at 1451 and received telephone approval for admission today.  Admission Coordinator:  Retta Diones, time1451/Date2/13/18

## 2016-12-30 NOTE — Progress Notes (Signed)
Rehab admissions - So far today, I do not have a bed available on rehab for this patient.  Social worker is aware.  If I get a bed later today, I will update all.  Call me for questions.  [phone]

## 2016-12-30 NOTE — Care Management Important Message (Signed)
Important Message  Patient Details  Name: [name] MRN: [id number] Date of Birth: Jun 14, 1928   Medicare Important Message Given:  Yes    Nathen May 12/30/2016, 1:14 PM

## 2016-12-30 NOTE — Progress Notes (Signed)
Rehab admissions - A rehab bed has become available this afternoon for patient.  I spoke with patient and her daughter and they are very pleased about admitting to inpatient rehab.  Will admit to inpatient rehab today.  Call me for questions.  [phone]

## 2016-12-30 NOTE — H&P (Signed)
Physical Medicine and Rehabilitation Admission H&P    Chief complaint: Stump pain  HPI: [name] is a 83 y.o. right handed female with history of right elbow replacement and atrial fibrillation maintained on Plavix and aspirin, GI bleed, chronic low back pain, fibromyalgia, CRI stage III creatinine 1.07-1.19, hypertension, peripheral vascular disease with history of femoral-popliteal bypass grafting 09/24/2016. Per chart review patient lives with spouse. Used a cane and/or walker when needed. One level home with. Daughter can assist. Presented 12/10/2016 with chronic right toe wound with increasing pain and ischemic changes followed by vascular surgery. No change with conservative care and underwent right transmetatarsal amputation 12/11/2016 per [name]. [facility] course pain management. Subcutaneous Lovenox for DVT prophylaxis. Acute on chronic anemia 7.9 and monitored. Progressive ischemic changes of right TMA with nonviable tissue surrounding incision. The calf was warm to touch and followed closely by vascular surgery. 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 Plavix resumed as per cardiology services. Palliative care has been following for goals of care. Therapies initiated with slow progressive gains. Recommendations for physical medicine rehabilitation consult. Patient was admitted for a comprehensive rehabilitation program.  Review of Systems  Constitutional: Negative for chills and fever.  HENT: Negative for hearing loss.   Eyes: Negative for blurred vision and double vision.  Respiratory: Positive for shortness of breath. Negative for cough.   Cardiovascular: Positive for palpitations and leg swelling.  Gastrointestinal: Positive for constipation. Negative for nausea and vomiting.       GERD  Genitourinary: Negative for dysuria, flank pain and hematuria.  Musculoskeletal: Positive for falls and myalgias.  Skin: Negative for rash.  Neurological: Positive for  dizziness. Negative for seizures.  All other systems reviewed and are negative.  Past Medical History:  Diagnosis Date  . Anemia   . Anginal pain ([place])   . Arthritis    "qwhere" (09/05/2016)  . Atrial fibrillation ([place]) 09/2016  . Chronic lower back pain   . Complication of anesthesia    "takes a long time for it to wear off; I can hallucinate if I take too much" (09/05/2016)  . DVT (deep venous thrombosis) ([place]) 10/2009  . Fall from steps 08/31/2013   Fx. pelvis, Left Hip, Left Elbow  . Fibromyalgia   . GERD (gastroesophageal reflux disease)    09/22/16- "no too much anymore"  . GI bleed 10/24/2016  . High cholesterol   . History of blood transfusion   . History of hiatal hernia   . Hypertension   . Macular degeneration, wet ([place])    "started in right eye; now legally blind in that eye; now started in left eye but pretty much in control" (09/05/2016)  . Osteoporosis   . Peripheral vascular disease ([place])    nonviable tissue Right foot  . PONV (postoperative nausea and vomiting)   . Squamous cell carcinoma of skin of right calf Aug. 2015  . Stroke ([place])    TIA's no residual  . TIA (transient ischemic attack)    "several at once; none in a long time" (09/05/2016)   Past Surgical History:  Procedure Laterality Date  . ABDOMINAL AORTAGRAM N/A 12/26/2014   Procedure: ABDOMINAL Maxcine Ham;  Surgeon: Serafina Mitchell, MD;  Location: [facility] CATH LAB;  Service: Cardiovascular;  Laterality: N/A;  . AMPUTATION Right 12/23/2016   Procedure: AMPUTATION BELOW KNEE;  Surgeon: Serafina Mitchell, MD;  Location: [place];  Service: Vascular;  Laterality: Right;  . AORTOGRAM N/A 09/26/2016   Procedure: AORTOGRAM;  Surgeon: Waynetta Sandy, MD;  Location: [place];  Service: Vascular;  Laterality: N/A;  . CARPAL TUNNEL RELEASE Right   . CATARACT EXTRACTION W/ INTRAOCULAR LENS  IMPLANT, BILATERAL Bilateral   . COLONOSCOPY    . DILATION AND CURETTAGE OF UTERUS    . EYE SURGERY Right    "macular OR"    . FEMORAL ARTERY STENT  12-11-10   Left SFA  . FEMORAL-POPLITEAL BYPASS GRAFT Left 09/24/2016   Procedure: REDO FEMORAL TO POPLITEAL ARTERY BYPASS GRAFT USING 6MM PROPATEN RINGED GORTEX GRAFT;  Surgeon: Serafina Mitchell, MD;  Location: [place];  Service: Vascular;  Laterality: Left;  . I&D EXTREMITY Right 12/17/2016   Procedure: IRRIGATION AND DEBRIDEMENT RIGHT FOOT;  Surgeon: Serafina Mitchell, MD;  Location: [place];  Service: Vascular;  Laterality: Right;  . INCISION AND DRAINAGE OF WOUND Left 10/25/2009   leg/notes 11/13/2009  . INSERTION OF ILIAC STENT Left 12/26/2014   Procedure: INSERTION OF ILIAC STENT;  Surgeon: Serafina Mitchell, MD;  Location: [facility] CATH LAB;  Service: Cardiovascular;  Laterality: Left;  . INSERTION OF ILIAC STENT Left 09/26/2016   Procedure: SUB INTIMAL INSERTION OF SUPERFICIAL FEMORAL ARTERY AND BELOW KNEE BYPASS GRAFT;  Surgeon: Waynetta Sandy, MD;  Location: [place];  Service: Vascular;  Laterality: Left;  . JOINT REPLACEMENT     knee  . JOINT REPLACEMENT Left Oct. 17, 2014   Elbow ( pt fell 08-31-13 )  . ORIF SHOULDER FRACTURE Right    "it was crushed"  . PERIPHERAL VASCULAR CATHETERIZATION N/A 10/30/2015   Procedure: Abdominal Aortogram w/Lower Extremity;  Surgeon: Serafina Mitchell, MD;  Location: [place] CV LAB;  Service: Cardiovascular;  Laterality: N/A;  . PERIPHERAL VASCULAR CATHETERIZATION  10/30/2015   Procedure: Peripheral Vascular Intervention;  Surgeon: Serafina Mitchell, MD;  Location: [place] CV LAB;  Service: Cardiovascular;;  . PERIPHERAL VASCULAR CATHETERIZATION N/A 04/01/2016   Procedure: Abdominal Aortogram w/Lower Extremity;  Surgeon: Serafina Mitchell, MD;  Location: [place] CV LAB;  Service: Cardiovascular;  Laterality: N/A;  . PERIPHERAL VASCULAR CATHETERIZATION Left 04/01/2016   Procedure: Peripheral Vascular Atherectomy;  Surgeon: Serafina Mitchell, MD;  Location: [place] CV LAB;  Service: Cardiovascular;  Laterality: Left;   Superficial femoral artery.  Marland Kitchen PERIPHERAL VASCULAR CATHETERIZATION Right 09/05/2016   "stent"  . PERIPHERAL VASCULAR CATHETERIZATION N/A 09/05/2016   Procedure: Abdominal Aortogram w/Lower Extremity;  Surgeon: Serafina Mitchell, MD;  Location: [place] CV LAB;  Service: Cardiovascular;  Laterality: N/A;  . PERIPHERAL VASCULAR CATHETERIZATION Right 09/05/2016   Procedure: Peripheral Vascular Intervention;  Surgeon: Serafina Mitchell, MD;  Location: [place] CV LAB;  Service: Cardiovascular;  Laterality: Right;  Superficial Femoral  . PERIPHERAL VASCULAR CATHETERIZATION Left 09/09/2016   Procedure: Lower Extremity Angiography;  Surgeon: Serafina Mitchell, MD;  Location: [place] CV LAB;  Service: Cardiovascular;  Laterality: Left;  . PR VEIN BYPASS GRAFT,AORTO-FEM-POP  09-13-09   Left Fem-pop  . THROMBECTOMY FEMORAL ARTERY Right 09/26/2016   Procedure: Thromboembolectomy Right Lower Extremity, Right Femoral Artery Endarterectomy with Patch Angioplasty; Right Lower Extremity Angiogram ;  Surgeon: Waynetta Sandy, MD;  Location: [place];  Service: Vascular;  Laterality: Right;  . TOTAL ELBOW ARTHROPLASTY Left 09/03/2013   Procedure: LEFT TOTAL ELBOW ARTHROPLASTY;  Surgeon: Roseanne Kaufman, MD;  Location: [place];  Service: Orthopedics;  Laterality: Left;  . TOTAL KNEE ARTHROPLASTY Left 06/2006  . TRANSMETATARSAL AMPUTATION Right 12/11/2016   Procedure: TRANSMETATARSAL AMPUTATION;  Surgeon: Durene Fruits  Pierre Bali, MD;  Location: [place];  Service: Vascular;  Laterality: Right;  . TUBAL LIGATION    . VAGINAL HYSTERECTOMY     Family History  Problem Relation Age of Onset  . Heart disease Father     Heart Disease before age 45  . Hyperlipidemia Father   . Hypertension Father   . Alcohol abuse Father   . Heart disease Brother   . Hyperlipidemia Brother   . Hypertension Brother   . Deep vein thrombosis Brother   . Heart disease Son     Heart Disease before age 33  . Hyperlipidemia Son   .  Hypertension Son   . Heart attack Son   . Diabetes Son   . Hypertension Son   . Hyperlipidemia Sister   . Hypertension Sister    Social History:  reports that she quit smoking about 70 years ago. Her smoking use included Cigarettes. She has never used smokeless tobacco. She reports that she does not drink alcohol or use drugs. Allergies:  Allergies  Allergen Reactions  . Motrin [Ibuprofen] Other (See Comments)    ADVERSE REACTION - GI BLEED  . Statins Other (See Comments)    ADVERSE REACTION MUSCLE PAIN & WEAKNESS  . Morphine And Related Other (See Comments)    HALLUCINATIONS REACTION IS SIDE EFFECT  . Oxycontin [Oxycodone Hcl] Other (See Comments)    [REACTION IS SIDE EFFECT]  Hallinatations  . Promethazine Hcl Other (See Comments)    IV  Drug only, makes her act crazy  . Sulfa Antibiotics Nausea And Vomiting   Medications Prior to Admission  Medication Sig Dispense Refill  . acetaminophen (TYLENOL) 500 MG tablet Take 1,000 mg by mouth 2 (two) times daily as needed for moderate pain or headache. Patient took this medication for her pain.    . cephALEXin (KEFLEX) 500 MG capsule Take 1 capsule (500 mg total) by mouth 3 (three) times daily. 30 capsule 0  . Cholecalciferol (VITAMIN D3) 2000 UNITS TABS Take 2,000 Units by mouth at bedtime.     . clopidogrel (PLAVIX) 75 MG tablet Take 75 mg by mouth daily.    . Cyanocobalamin (VITAMIN B-12 PO) Take 1 tablet by mouth daily.    . diazepam (VALIUM) 5 MG tablet Take 2.5 mg by mouth at bedtime as needed for anxiety.     . gabapentin (NEURONTIN) 300 MG capsule Take 2 capsules (600 mg total) by mouth 2 (two) times daily. 120 capsule 3  . HYDROcodone-acetaminophen (NORCO/VICODIN) 5-325 MG tablet Take 1-2 tablets by mouth every 4 (four) hours as needed for moderate pain. 30 tablet 0  . metoprolol (LOPRESSOR) 50 MG tablet Take 1 tablet (50 mg total) by mouth 2 (two) times daily. Take 50mg  by mouth in the morning and take 25mg  by mouth at bedtime.  (Patient taking differently: Take 50 mg by mouth 2 (two) times daily. ) 60 tablet 0  . Multiple Vitamins-Minerals (EYE VITAMINS PO) Take 1 tablet by mouth at bedtime.     . pantoprazole (PROTONIX) 40 MG tablet Take 1 tablet (40 mg total) by mouth daily. 30 tablet 1  . valsartan-hydrochlorothiazide (DIOVAN-HCT) 160-12.5 MG per tablet Take 0.5 tablets by mouth daily.    Marland Kitchen loperamide (IMODIUM) 2 MG capsule Take 2 mg by mouth as needed for diarrhea or loose stools.      Home: Home Living Family/patient expects to be discharged to:: Inpatient rehab Living Arrangements: Spouse/significant other Available Help at Discharge: Family, Available 24 hours/day Type of Home:  House Home Access: Ramped entrance Home Layout: One level Bathroom Shower/Tub: Multimedia programmer: Standard Bathroom Accessibility: Yes Home Equipment: Environmental consultant - 2 wheels, Bedside commode, Cane - single point, Grab bars - tub/shower, Wheelchair - manual Additional Comments: pt family is planning for 24/7 supervision   Functional History: Prior Function Level of Independence: Needs assistance Gait / Transfers Assistance Needed: RW with some family help recently ADL's / Homemaking Assistance Needed: daughter assists with shower transfers and ADL as needed  Functional Status:  Mobility: Bed Mobility Overal bed mobility: Needs Assistance Bed Mobility: Sit to Supine Rolling: Min assist Sidelying to sit: Mod assist Supine to sit: Min assist Sit to supine: Mod assist General bed mobility comments: pt able to scoot self back on bed and manage both LEs back into bed with minA Transfers Overall transfer level: Needs assistance Equipment used:  (1 person lift with gait belt) Transfer via Lift Equipment:  (maximove pad underneath pt in chair for back to bed) Transfers: Sit to/from Stand, Technical brewer Transfers Sit to Stand: Max assist (or modAx2) Stand pivot transfers: Max assist (or modAx2) General transfer comment:  max directional v/c's to hold onto PT to prevent pt from reaching impulsively and retropulsion. unable to achieve L knee terminal extension, knee and foot blocked. pt c/o L LE pain and fear of falling. completed with 1 person as pt impulsive and can't focus, easier to keep pt on task and elimate impulsivity Ambulation/Gait General Gait Details: unable    ADL: ADL Overall ADL's : Needs assistance/impaired Eating/Feeding: Set up, Sitting Grooming: Set up, Supervision/safety, Bed level Upper Body Bathing: Minimal assistance, Sitting Lower Body Bathing: Maximal assistance, Bed level Upper Body Dressing : Min guard, Sitting Lower Body Dressing: Total assistance Lower Body Dressing Details (indicate cue type and reason): to don L sock at bed level Toilet Transfer: Maximal assistance, +2 for physical assistance, Stand-pivot Toilet Transfer Details (indicate cue type and reason): Simulated by stand pivot to chair Toileting- Clothing Manipulation and Hygiene: +2 for physical assistance, Maximal assistance, +2 for safety/equipment, Bed level Functional mobility during ADLs: Maximal assistance, +2 for physical assistance (for stand pivot only) General ADL Comments: Pt seen for ADL retraining session this morning with focus on bed mobility, sitting EOB/balance x79min at supervision level after sitting EOB to encourange increased activity tolerance. Pt requesting pain medication at begining of OT, RN staff made aware. After ~6-76min, pt requesting getting back to bed reporting that stump begins to throb and "I get lightheaded". Pt is a good candidate for CIR Rehab is hoepful to go later today if medically able. Pt/family were educated verbally in transfers (SPT vs sliding board) and pt is agreeable to this when she gets to Rehab.  Need to update acute OT goals.  Cognition: Cognition Overall Cognitive Status: Within Functional Limits for tasks assessed Orientation Level: Oriented  X4 Cognition Arousal/Alertness: Awake/alert Behavior During Therapy: Anxious (falling and LE pain with mobility) Overall Cognitive Status: Within Functional Limits for tasks assessed General Comments: Mildly confused at times. Has been using PCA and IV pain medication today.  Physical Exam: Blood pressure (!) 150/54, pulse 69, temperature 98.4 F (36.9 C), temperature source Oral, resp. rate 16, height 5\' 2"  (1.575 m), weight 48.2 kg (106 lb 3.2 oz), SpO2 95 %. Physical Exam  HENT:  Head: Normocephalic.  Eyes: EOM are normal. Left eye exhibits no discharge.  Neck: Normal range of motion. Neck supple. No thyromegaly present.  Cardiovascular: Exam reveals no gallop and no friction rub.  No murmur heard. Cardiac rate controlled  Respiratory: Effort normal and breath sounds normal. No respiratory distress. She has no wheezes.  GI: Soft. Bowel sounds are normal. She exhibits no distension. There is no tenderness.  Psychiatric: She has a normal mood and affect. Her behavior is normal.  Skin. BKA incision intact and well approximated. Mild sersosang drainage along right lateral aspect Musculoskeletal. Arthritic changes in both hands. Right BK edematous and sensitive to touch. Able to fully extend the knee while supine.  Neurological: She is oriented to person, place, and time. No cranial nerve deficit.  Mood is a bit flat but appropriate. She makes good eye contact with examiner. Intrinsic minus left foot. Stocking glove sensory loss up to left knee and to a lesser extent in the hands. UE grossly 4-5/5 with some weakness in both hands. LLE grossly 3-4/5 proximal to distal      Medical Problem List and Plan: 1.  Decreased functional mobility secondary to right BKA 12/23/2016  -admit to inpatient rehab 2.  DVT Prophylaxis/Anticoagulation: Subcutaneous Lovenox. Monitor platelet counts and any signs of bleeding 3. Pain Management/fibromyalgia: Neurontin 600 mg 3 times a day, Duragesic patch  12.5 g, oxycodone as needed 4. Mood: Valium 2.5 mg daily at bedtime as needed anxiety 5. Neuropsych: This patient is capable of making decisions on her own behalf. 6. Skin/Wound Care: Routine skin checks 7. Fluids/Electrolytes/Nutrition: Routine I&O with follow-up chemistries upon admit 8. Acute blood loss anemia. Follow-up CBC 9. Atrial fibrillation. Lopressor 50 mg daily and 25 mg daily at bedtime. Cardiac rate control. Follow-up per cardiology services 10. Hypertension. Avapro 75 mg daily, HCTZ 6.25 mg daily. Monitor with increased mobility 11. CRI stage III. Creatinine baseline 1.07-1.19. Follow-up chemistries 12. History of GI bleed. Continue Protonix 13. Constipation. Laxative assistance 14. Decreased nutritional storage. Dietary follow-up 15. Hx of right elbow replacement 2015---5 lb weight limit through elbow    Post Admission Physician Evaluation: 1. Functional deficits secondary  to right BKA. 2. Patient is admitted to receive collaborative, interdisciplinary care between the physiatrist, rehab nursing staff, and therapy team. 3. Patient's level of medical complexity and substantial therapy needs in context of that medical necessity cannot be provided at a lesser intensity of care such as a SNF. 4. Patient has experienced substantial functional loss from his/her baseline which was documented above under the "Functional History" and "Functional Status" headings.  Judging by the patient's diagnosis, physical exam, and functional history, the patient has potential for functional progress which will result in measurable gains while on inpatient rehab.  These gains will be of substantial and practical use upon discharge  in facilitating mobility and self-care at the household level. 5. Physiatrist will provide 24 hour management of medical needs as well as oversight of the therapy plan/treatment and provide guidance as appropriate regarding the interaction of the two. 6. The Preadmission  Screening has been reviewed and patient status is unchanged unless otherwise stated above. 7. 24 hour rehab nursing will assist with bladder management, bowel management, safety, skin/wound care, disease management, medication administration, pain management and patient education  and help integrate therapy concepts, techniques,education, etc. 8. PT will assess and treat for/with: Lower extremity strength, range of motion, stamina, balance, functional mobility, safety, adaptive techniques and equipment, pain mgt, pre-prosthetic education, family ed.   Goals are: supervision to min assist at w/c level. 9. OT will assess and treat for/with: ADL's, functional mobility, safety, upper extremity strength, adaptive techniques and equipment, pain mgt, pre-prosthetic education, right elbow precatuions.  Goals are: mod I to min assist. Therapy may not yet proceed with showering this patient. 10. SLP will assess and treat for/with: n/a.  Goals are: n/a. 11. Case Management and Social Worker will assess and treat for psychological issues and discharge planning. 12. Team conference will be held weekly to assess progress toward goals and to determine barriers to discharge. 13. Patient will receive at least 3 hours of therapy per day at least 5 days per week. 14. ELOS: 13-16 days       15. Prognosis:  excellent     Meredith Staggers, MD, [facility] 12/30/2016  Cathlyn Parsons., PA-C 12/30/2016

## 2016-12-30 NOTE — Progress Notes (Signed)
Patient was transfer to  Rm 4 mw4 with belongings and family. She travel in bed with me and N.T . Patient was alert and orin. With no complaints

## 2016-12-31 ENCOUNTER — Encounter (HOSPITAL_COMMUNITY): Payer: Self-pay

## 2016-12-31 ENCOUNTER — Inpatient Hospital Stay (HOSPITAL_COMMUNITY): Payer: Medicare Other | Admitting: Physical Therapy

## 2016-12-31 ENCOUNTER — Inpatient Hospital Stay (HOSPITAL_COMMUNITY): Payer: Medicare Other | Admitting: Occupational Therapy

## 2016-12-31 DIAGNOSIS — G546 Phantom limb syndrome with pain: Secondary | ICD-10-CM

## 2016-12-31 LAB — CBC WITH DIFFERENTIAL/PLATELET
Basophils Absolute: 0.1 10*3/uL (ref 0.0–0.1)
Basophils Relative: 1 %
EOS PCT: 6 %
Eosinophils Absolute: 0.5 10*3/uL (ref 0.0–0.7)
HEMATOCRIT: 23.8 % — AB (ref 36.0–46.0)
HEMOGLOBIN: 7.5 g/dL — AB (ref 12.0–15.0)
LYMPHS PCT: 30 %
Lymphs Abs: 2.6 10*3/uL (ref 0.7–4.0)
MCH: 28.7 pg (ref 26.0–34.0)
MCHC: 31.5 g/dL (ref 30.0–36.0)
MCV: 91.2 fL (ref 78.0–100.0)
MONO ABS: 1 10*3/uL (ref 0.1–1.0)
MONOS PCT: 11 %
NEUTROS PCT: 52 %
Neutro Abs: 4.5 10*3/uL (ref 1.7–7.7)
Platelets: 544 10*3/uL — ABNORMAL HIGH (ref 150–400)
RBC: 2.61 MIL/uL — AB (ref 3.87–5.11)
RDW: 15.3 % (ref 11.5–15.5)
WBC: 8.7 10*3/uL (ref 4.0–10.5)

## 2016-12-31 LAB — COMPREHENSIVE METABOLIC PANEL
ALBUMIN: 2.5 g/dL — AB (ref 3.5–5.0)
ALT: 19 U/L (ref 14–54)
AST: 17 U/L (ref 15–41)
Alkaline Phosphatase: 66 U/L (ref 38–126)
Anion gap: 13 (ref 5–15)
BILIRUBIN TOTAL: 0.4 mg/dL (ref 0.3–1.2)
BUN: 11 mg/dL (ref 6–20)
CO2: 23 mmol/L (ref 22–32)
Calcium: 9 mg/dL (ref 8.9–10.3)
Chloride: 100 mmol/L — ABNORMAL LOW (ref 101–111)
Creatinine, Ser: 0.94 mg/dL (ref 0.44–1.00)
GFR calc Af Amer: >60 mL/min (ref >60)
GFR calc non Af Amer: 53 mL/min — ABNORMAL LOW (ref >60)
GLUCOSE: 98 mg/dL (ref 65–99)
POTASSIUM: 3.6 mmol/L (ref 3.5–5.1)
Sodium: 136 mmol/L (ref 135–145)
TOTAL PROTEIN: 5.9 g/dL — AB (ref 6.5–8.1)

## 2016-12-31 MED ORDER — GABAPENTIN 300 MG PO CAPS
300.0000 mg | ORAL_CAPSULE | Freq: Every day | ORAL | Status: DC
  Administered 2016-12-31 – 2017-01-12 (×13): 300 mg via ORAL
  Filled 2016-12-31 (×13): qty 1

## 2016-12-31 MED ORDER — NALOXONE HCL 0.4 MG/ML IJ SOLN
0.4000 mg | INTRAMUSCULAR | Status: DC | PRN
Start: 2016-12-31 — End: 2017-01-13

## 2016-12-31 MED ORDER — PRO-STAT SUGAR FREE PO LIQD
30.0000 mL | Freq: Two times a day (BID) | ORAL | Status: DC
  Administered 2016-12-31 – 2017-01-12 (×9): 30 mL via ORAL
  Filled 2016-12-31 (×24): qty 30

## 2016-12-31 MED ORDER — OXYCODONE HCL 5 MG PO TABS
5.0000 mg | ORAL_TABLET | ORAL | Status: DC | PRN
  Administered 2016-12-31 – 2017-01-13 (×24): 5 mg via ORAL
  Filled 2016-12-31 (×28): qty 1

## 2016-12-31 MED ORDER — BOOST PLUS PO LIQD
237.0000 mL | Freq: Two times a day (BID) | ORAL | Status: DC
  Administered 2017-01-02 – 2017-01-05 (×7): 237 mL via ORAL
  Filled 2016-12-31 (×28): qty 237

## 2016-12-31 NOTE — Progress Notes (Addendum)
Initial Nutrition Assessment  DOCUMENTATION CODES:   Severe malnutrition in context of chronic illness  INTERVENTION:  Discontinue Ensure.   Provide Boost Plus po BID, each supplement provides 360 kcal and 14 grams of protein.   Provide 30 ml Prostat po BID, each supplement provides 100 kcal and 15 grams of protein.   Encourage adequate PO intake.   NUTRITION DIAGNOSIS:   Malnutrition related to chronic illness as evidenced by percent weight loss, severe depletion of muscle mass.  GOAL:   Patient will meet greater than or equal to 90% of their needs  MONITOR:   PO intake, Supplement acceptance, Labs, Weight trends, Skin, I & O's  REASON FOR ASSESSMENT:   Malnutrition Screening Tool    ASSESSMENT:   83 y.o. right handed female with history of right elbow replacement and atrial fibrillation maintained on Plavix and aspirin, GI bleed, chronic low back pain, fibromyalgia, CRI stage III creatinine 1.07-1.19, hypertension, peripheral vascular disease with history of femoral-popliteal bypass grafting 09/24/2016.  Presented 12/10/2016 with chronic right toe wound with increasing pain and ischemic changes. 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  Pt reports a decreased appetite since admission. Meal completion has been 25-50%. Pt reports eating well PTA with usual consumption of at least 3 meals a day with snacks in between. Pt reports family usually cooks for her at home or either brings in foods for her. Family at bedside. Pt reports taste changes thus has only been taking a few bites of food at her meals. Family encouraged to bring in foods that pt prefers. Pt with a 11.7% weight loss in 4 months. Pt currently has Ensure ordered and has been refusing them report they are "too sweet". Pt agreeable to alternative supplements. RD to order. Pt educated on the importance of adequate caloric and protein intake for healing.   Nutrition-Focused physical exam completed. Findings  are no fat depletion, severe muscle depletion, and mild edema.   Labs and medications reviewed.   Diet Order:  Diet regular Room service appropriate? Yes; Fluid consistency: Thin  Skin:  Wound (see comment) (Stage II to sacrum, incision on R leg)  Last BM:  2/13  Height:   Ht Readings from Last 1 Encounters:  12/30/16 5\' 2"  (1.575 m)    Weight:   Wt Readings from Last 1 Encounters:  12/31/16 120 lb 13 oz (54.8 kg)    Ideal Body Weight:  46.75 kg (adjusted for R BKA)  BMI:  Body mass index is 22.1 kg/m.  Estimated Nutritional Needs:   Kcal:  1450-1650  Protein:  65-75 grams  Fluid:  >/= 1.5 L/day  EDUCATION NEEDS:   Education needs addressed  Corrin Parker, MS, RD, LDN Pager # [phone] After hours/ weekend pager # [phone]

## 2016-12-31 NOTE — Progress Notes (Signed)
Meredith Staggers, MD Physician Signed Physical Medicine and Rehabilitation  Consult Note Date of Service: 12/12/2016 6:07 AM  Related encounter: Admission (Discharged) from 12/11/2016 in [place] Collapse All   [] Hide copied text [] Hover for attribution information      Physical Medicine and Rehabilitation Consult Reason for Consult: Right transmetatarsal amputation Referring Physician: [name]   HPI: [name] is a 83 y.o. right handed female with history of atrial fibrillation maintained on Plavix and aspirin, GI bleed, chronic low back pain, fibromyalgia, CRI stage III creatinine 1.07-1.19, hypertension, peripheral vascular disease with history of femoral-popliteal bypass grafting 09/24/2016. Per chart review patient lives with spouse. Used a cane and/or walker when needed. One level home with. Daughter can assist. Presented 12/10/2016 with chronic right toe wound with increasing pain and ischemic changes followed by vascular surgery. No change with conservative care and underwent right transmetatarsal amputation 12/11/2016 per [name]. [facility] course pain management. Subcutaneous Lovenox for DVT prophylaxis. Acute on chronic anemia 10.1 and monitored. Physical and occupational therapy evaluations pending. Pt unable to mobilize yet due to significant bleeding from the incision.  M.D. has requested physical medicine rehabilitation consult.   Review of Systems  Constitutional: Negative for chills and fever.  HENT: Negative for hearing loss and tinnitus.   Eyes: Negative for blurred vision and double vision.  Respiratory: Negative for cough and shortness of breath.   Cardiovascular: Positive for palpitations and leg swelling. Negative for chest pain.  Gastrointestinal: Positive for constipation and nausea. Negative for vomiting.       GERD  Genitourinary: Negative for dysuria, flank pain and hematuria.    Musculoskeletal: Positive for back pain, joint pain and myalgias.  Skin: Negative for rash.  Neurological: Negative for dizziness, speech change and headaches.  Psychiatric/Behavioral: The patient has insomnia.   All other systems reviewed and are negative.      Past Medical History:  Diagnosis Date  . Anemia   . Anginal pain ([place])   . Arthritis    "qwhere" (09/05/2016)  . Atrial fibrillation ([place]) 09/2016  . Chronic lower back pain   . Complication of anesthesia    "takes a long time for it to wear off; I can hallucinate if I take too much" (09/05/2016)  . DVT (deep venous thrombosis) ([place]) 10/2009  . Fall from steps 08/31/2013   Fx. pelvis, Left Hip, Left Elbow  . Fibromyalgia   . GERD (gastroesophageal reflux disease)    09/22/16- "no too much anymore"  . GI bleed 10/24/2016  . High cholesterol   . History of blood transfusion   . History of hiatal hernia   . Hypertension   . Macular degeneration, wet ([place])    "started in right eye; now legally blind in that eye; now started in left eye but pretty much in control" (09/05/2016)  . Osteoporosis   . Peripheral vascular disease ([place])    nonviable tissue Right foot  . PONV (postoperative nausea and vomiting)   . Squamous cell carcinoma of skin of right calf Aug. 2015  . Stroke ([place])    TIA's no residual  . TIA (transient ischemic attack)    "several at once; none in a long time" (09/05/2016)        Past Surgical History:  Procedure Laterality Date  . ABDOMINAL AORTAGRAM N/A 12/26/2014   Procedure: ABDOMINAL Maxcine Ham;  Surgeon: Serafina Mitchell, MD;  Location[facility] CATH LAB;  Service: Cardiovascular;  Laterality: N/A;  . AORTOGRAM N/A 09/26/2016   Procedure: AORTOGRAM;  Surgeon: Waynetta Sandy, MD;  Location: [place];  Service: Vascular;  Laterality: N/A;  . CARPAL TUNNEL RELEASE Right   . CATARACT EXTRACTION W/ INTRAOCULAR LENS  IMPLANT, BILATERAL Bilateral   . COLONOSCOPY    .  DILATION AND CURETTAGE OF UTERUS    . EYE SURGERY Right    "macular OR"  . FEMORAL ARTERY STENT  12-11-10   Left SFA  . FEMORAL-POPLITEAL BYPASS GRAFT Left 09/24/2016   Procedure: REDO FEMORAL TO POPLITEAL ARTERY BYPASS GRAFT USING 6MM PROPATEN RINGED GORTEX GRAFT;  Surgeon: Serafina Mitchell, MD;  Location: [place];  Service: Vascular;  Laterality: Left;  . INCISION AND DRAINAGE OF WOUND Left 10/25/2009   leg/notes 11/13/2009  . INSERTION OF ILIAC STENT Left 12/26/2014   Procedure: INSERTION OF ILIAC STENT;  Surgeon: Serafina Mitchell, MD;  Location: [facility] CATH LAB;  Service: Cardiovascular;  Laterality: Left;  . INSERTION OF ILIAC STENT Left 09/26/2016   Procedure: SUB INTIMAL INSERTION OF SUPERFICIAL FEMORAL ARTERY AND BELOW KNEE BYPASS GRAFT;  Surgeon: Waynetta Sandy, MD;  Location: [place];  Service: Vascular;  Laterality: Left;  . JOINT REPLACEMENT     knee  . JOINT REPLACEMENT Left Oct. 17, 2014   Elbow ( pt fell 08-31-13 )  . ORIF SHOULDER FRACTURE Right    "it was crushed"  . PERIPHERAL VASCULAR CATHETERIZATION N/A 10/30/2015   Procedure: Abdominal Aortogram w/Lower Extremity;  Surgeon: Serafina Mitchell, MD;  Location: [place] CV LAB;  Service: Cardiovascular;  Laterality: N/A;  . PERIPHERAL VASCULAR CATHETERIZATION  10/30/2015   Procedure: Peripheral Vascular Intervention;  Surgeon: Serafina Mitchell, MD;  Location: [place] CV LAB;  Service: Cardiovascular;;  . PERIPHERAL VASCULAR CATHETERIZATION N/A 04/01/2016   Procedure: Abdominal Aortogram w/Lower Extremity;  Surgeon: Serafina Mitchell, MD;  Location: [place] CV LAB;  Service: Cardiovascular;  Laterality: N/A;  . PERIPHERAL VASCULAR CATHETERIZATION Left 04/01/2016   Procedure: Peripheral Vascular Atherectomy;  Surgeon: Serafina Mitchell, MD;  Location: [place] CV LAB;  Service: Cardiovascular;  Laterality: Left;  Superficial femoral artery.  Marland Kitchen PERIPHERAL VASCULAR CATHETERIZATION Right 09/05/2016    "stent"  . PERIPHERAL VASCULAR CATHETERIZATION N/A 09/05/2016   Procedure: Abdominal Aortogram w/Lower Extremity;  Surgeon: Serafina Mitchell, MD;  Location: [place] CV LAB;  Service: Cardiovascular;  Laterality: N/A;  . PERIPHERAL VASCULAR CATHETERIZATION Right 09/05/2016   Procedure: Peripheral Vascular Intervention;  Surgeon: Serafina Mitchell, MD;  Location: [place] CV LAB;  Service: Cardiovascular;  Laterality: Right;  Superficial Femoral  . PERIPHERAL VASCULAR CATHETERIZATION Left 09/09/2016   Procedure: Lower Extremity Angiography;  Surgeon: Serafina Mitchell, MD;  Location: [place] CV LAB;  Service: Cardiovascular;  Laterality: Left;  . PR VEIN BYPASS GRAFT,AORTO-FEM-POP  09-13-09   Left Fem-pop  . THROMBECTOMY FEMORAL ARTERY Right 09/26/2016   Procedure: Thromboembolectomy Right Lower Extremity, Right Femoral Artery Endarterectomy with Patch Angioplasty; Right Lower Extremity Angiogram ;  Surgeon: Waynetta Sandy, MD;  Location: [place];  Service: Vascular;  Laterality: Right;  . TOTAL ELBOW ARTHROPLASTY Left 09/03/2013   Procedure: LEFT TOTAL ELBOW ARTHROPLASTY;  Surgeon: Roseanne Kaufman, MD;  Location: [place];  Service: Orthopedics;  Laterality: Left;  . TOTAL KNEE ARTHROPLASTY Left 06/2006  . TUBAL LIGATION    . VAGINAL HYSTERECTOMY           Family History  Problem Relation Age of Onset  . Heart disease Father  Heart Disease before age 4  . Hyperlipidemia Father   . Hypertension Father   . Alcohol abuse Father   . Heart disease Brother   . Hyperlipidemia Brother   . Hypertension Brother   . Deep vein thrombosis Brother   . Heart disease Son     Heart Disease before age 15  . Hyperlipidemia Son   . Hypertension Son   . Heart attack Son   . Diabetes Son   . Hypertension Son   . Hyperlipidemia Sister   . Hypertension Sister    Social History:  reports that she quit smoking about 70 years ago. Her smoking use included  Cigarettes. She has never used smokeless tobacco. She reports that she does not drink alcohol or use drugs. Allergies:       Allergies  Allergen Reactions  . Motrin [Ibuprofen] Other (See Comments)    ADVERSE REACTION - GI BLEED  . Morphine And Related Other (See Comments)    HALLUCINATIONS REACTION IS SIDE EFFECT  . Oxycontin [Oxycodone Hcl] Other (See Comments)    [REACTION IS SIDE EFFECT]  Hallinatations  . Statins Other (See Comments)    ADVERSE REACTION MUSCLE PAIN & WEAKNESS  . Promethazine Hcl Other (See Comments)    IV  Drug only, makes her act crazy  . Sulfa Antibiotics Nausea And Vomiting         Medications Prior to Admission  Medication Sig Dispense Refill  . acetaminophen (TYLENOL) 500 MG tablet Take 1,000 mg by mouth 2 (two) times daily as needed for moderate pain or headache. Patient took this medication for her pain.    . cephALEXin (KEFLEX) 500 MG capsule Take 1 capsule (500 mg total) by mouth 3 (three) times daily. 30 capsule 0  . Cholecalciferol (VITAMIN D3) 2000 UNITS TABS Take 2,000 Units by mouth at bedtime.     . clopidogrel (PLAVIX) 75 MG tablet Take 75 mg by mouth daily.    . Cyanocobalamin (VITAMIN B-12 PO) Take 1 tablet by mouth daily.    . diazepam (VALIUM) 5 MG tablet Take 2.5 mg by mouth at bedtime as needed for anxiety.     . gabapentin (NEURONTIN) 300 MG capsule Take 2 capsules (600 mg total) by mouth 2 (two) times daily. 120 capsule 3  . HYDROcodone-acetaminophen (NORCO/VICODIN) 5-325 MG tablet Take 1-2 tablets by mouth every 4 (four) hours as needed for moderate pain. 30 tablet 0  . metoprolol (LOPRESSOR) 50 MG tablet Take 1 tablet (50 mg total) by mouth 2 (two) times daily. Take 50mg  by mouth in the morning and take 25mg  by mouth at bedtime. (Patient taking differently: Take 50 mg by mouth 2 (two) times daily. ) 60 tablet 0  . Multiple Vitamins-Minerals (EYE VITAMINS PO) Take 1 tablet by mouth at bedtime.     . pantoprazole  (PROTONIX) 40 MG tablet Take 1 tablet (40 mg total) by mouth daily. 30 tablet 1  . valsartan-hydrochlorothiazide (DIOVAN-HCT) 160-12.5 MG per tablet Take 0.5 tablets by mouth daily.    Marland Kitchen loperamide (IMODIUM) 2 MG capsule Take 2 mg by mouth as needed for diarrhea or loose stools.      Home: Home Living Living Arrangements: Children  Functional History: Functional Status:  Mobility:  ADL:  Cognition: Cognition Orientation Level: Oriented X4  Blood pressure (!) 149/49, pulse 64, temperature 97.7 F (36.5 C), temperature source Oral, resp. rate 17, SpO2 95 %. Physical Exam  Vitals reviewed. Constitutional: She is oriented to person, place, and time.  HENT:  Head: Normocephalic.  Eyes: EOM are normal.  Neck: Normal range of motion. Neck supple. No thyromegaly present.  Cardiovascular:  Cardiac rate controlled  Respiratory: Effort normal and breath sounds normal. No respiratory distress.  GI: Soft. Bowel sounds are normal. She exhibits no distension.  Musculoskeletal:  Arthritic changes in both hands.   Neurological: She is oriented to person, place, and time. No cranial nerve deficit.  Mood is a bit flat but appropriate. She makes good eye contact with examiner. Intrinsic minus left foot. Stocking glove sensory loss up to knees and to a lesser extent in the hands. UE grossly 4-5/5 with some weakness in both hands. LLE grossly 3-4/5  Skin:  Right transmetatarsal amputation site is dressed appropriately tender  Psychiatric: She has a normal mood and affect. Her behavior is normal. Judgment and thought content normal.        Assessment/Plan: Diagnosis: Right transmetatarsal amputation, hx of severe peripheral neuropathy 1. Does the need for close, 24 hr/day medical supervision in concert with the patient's rehab needs make it unreasonable for this patient to be served in a less intensive setting? Potentially 2. Co-Morbidities requiring supervision/potential  complications: PAD, wound care issues, paroxysmal afib, htn 3. Due to bladder management, bowel management, safety, skin/wound care, disease management, medication administration, pain management and patient education, does the patient require 24 hr/day rehab nursing? Yes 4. Does the patient require coordinated care of a physician, rehab nurse, PT (1-2 hrs/day, 5 days/week) and OT (1-2 hrs/day, 5 days/week) to address physical and functional deficits in the context of the above medical diagnosis(es)? Yes Addressing deficits in the following areas: balance, endurance, locomotion, strength, transferring, bowel/bladder control, bathing, dressing, feeding, grooming, toileting and psychosocial support 5. Can the patient actively participate in an intensive therapy program of at least 3 hrs of therapy per day at least 5 days per week? Potentially 6. The potential for patient to make measurable gains while on inpatient rehab is excellent 7. Anticipated functional outcomes upon discharge from inpatient rehab are modified independent  with PT, modified independent and supervision with OT, n/a with SLP. 8. Estimated rehab length of stay to reach the above functional goals is: 7-10 days potentially 9. Does the patient have adequate social supports and living environment to accommodate these discharge functional goals? Yes 10. Anticipated D/C setting: Home 11. Anticipated post D/C treatments: [place] therapy 12. Overall Rehab/Functional Prognosis: excellent  RECOMMENDATIONS: This patient's condition is appropriate for continued rehabilitative care in the following setting: see below Patient has agreed to participate in recommended program. Yes and Potentially Note that insurance prior authorization may be required for reimbursement for recommended care.  Comment: Pt has been unable to get out of bed yet due to bleeding from wound. Will follow as she mobilizes with PT.  Pt and family are interested in CIR if she  has functional needs.   Rehab Admissions Coordinator to follow up.  Thanks,  Meredith Staggers, MD, [facility]    Cathlyn Parsons., PA-C 12/12/2016    Revision History                        Routing History

## 2016-12-31 NOTE — Progress Notes (Signed)
Retta Diones, RN Rehab Admission Coordinator Signed Physical Medicine and Rehabilitation  PMR Pre-admission Date of Service: 12/30/2016 2:39 PM  Related encounter: Admission (Discharged) from 12/11/2016 in [place]       [] Hide copied text PMR Admission Coordinator Pre-Admission Assessment  Patient: [name] is an 83 y.o., female MRN: [id number] DOB: 1928-06-03 Height: 5\' 2"  (157.5 cm) Weight: 48.2 kg (106 lb 3.2 oz)                                                                                                                                                  Insurance Information HMO:No    PPO:       PCP:       IPA:       80/20:       OTHER:   PRIMARY:  Medicare A/B      Policy#:  [id number]      Subscriber:  Barry Brunner CM Name:        Phone#:       Fax#:   Pre-Cert#:        Employer:  Retired Benefits:  Phone #:       Name: Checked in [place]. Date: 09/18/95     Deduct:  $1340      Out of Pocket Max: none      Life Max: unlimited CIR: 100%      SNF: 100 days Outpatient: 80%     Co-Pay: 20% Home Health: 100%      Co-Pay: none DME: 80%     Co-Pay: 20% Providers: patient's choice  SECONDARY:  BCBS supplement      Policy#: [id number]      Subscriber:  Barry Brunner CM Name:        Phone#:       Fax#:   Pre-Cert#:        Employer:  Retired Benefits:  Phone #: [phone]     Name:   Eff. Date: 11/17/10      Deduct:        Out of Pocket Max:        Life Max:   CIR:        SNF:   Outpatient:       Co-Pay:   Home Health:        Co-Pay:   DME:       Co-Pay:    Medicaid Application Date:        Case Manager:   Disability Application Date:        Case Worker:    Emergency Contact Information        Contact Information    Name Relation Home Work [place] Daughter   [phone]   Lyllie, [place] [phone]       Current Medical History  Patient Admitting Diagnosis: R BKA  History of  Present Illness: An 83 y.o.right handed femalewith history of atrial fibrillation maintained on Plavix and aspirin, GI bleed, chronic low back pain, fibromyalgia, CRI stage III creatinine 1.07-1.19, hypertension, peripheral vascular disease with history of femoral-popliteal bypass grafting 09/24/2016.Per chart review patient lives with spouse. Used a cane and/or walker when needed. One level home with. Daughter can assist.Presented 12/10/2016 with chronic right toe wound with increasing pain and ischemic changes followed by vascular surgery. No change with conservative care and underwent right transmetatarsal amputation 12/11/2016 per [name]. [facility] course pain management. Subcutaneous Lovenox for DVT prophylaxis. Acute on chronic anemia 7.9and monitored. Progressive ischemic changes of right TMA with nonviable tissue surrounding incision. The calf was warm to touch and followed closely by vascular surgery. 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 Plavix resumed as per cardiology services. Palliative care has been following for goals of care. Therapies initiated with slow progressive gains. Recommendations for physical medicine rehabilitation consult. Patient to be admitted for a comprehensive inpatient rehabilitation program.   Past Medical History      Past Medical History:  Diagnosis Date  . Anemia   . Anginal pain ([place])   . Arthritis    "qwhere" (09/05/2016)  . Atrial fibrillation ([place]) 09/2016  . Chronic lower back pain   . Complication of anesthesia    "takes a long time for it to wear off; I can hallucinate if I take too much" (09/05/2016)  . DVT (deep venous thrombosis) ([place]) 10/2009  . Fall from steps 08/31/2013   Fx. pelvis, Left Hip, Left Elbow  . Fibromyalgia   . GERD (gastroesophageal reflux disease)    09/22/16- "no too much anymore"  . GI bleed 10/24/2016  . High cholesterol   . History of blood transfusion   . History of hiatal hernia     . Hypertension   . Macular degeneration, wet ([place])    "started in right eye; now legally blind in that eye; now started in left eye but pretty much in control" (09/05/2016)  . Osteoporosis   . Peripheral vascular disease ([place])    nonviable tissue Right foot  . PONV (postoperative nausea and vomiting)   . Squamous cell carcinoma of skin of right calf Aug. 2015  . Stroke ([place])    TIA's no residual  . TIA (transient ischemic attack)    "several at once; none in a long time" (09/05/2016)    Family History  family history includes Alcohol abuse in her father; Deep vein thrombosis in her brother; Diabetes in her son; Heart attack in her son; Heart disease in her brother, father, and son; Hyperlipidemia in her brother, father, sister, and son; Hypertension in her brother, father, sister, son, and son.  Prior Rehab/Hospitalizations: Had [facility] care from [facility] in 11/17 after bypass surgery.  Has the patient had major surgery during 100 days prior to admission? Yes.  Had surgery 3 times in 11/17 for bypass to right foot.  Current Medications   Current Facility-Administered Medications:  .  0.9 %  sodium chloride infusion, , Intravenous, Continuous, Waynetta Sandy, MD, Last Rate: 10 mL/hr at 12/25/16 2119 .  acetaminophen (TYLENOL) tablet 1,000 mg, 1,000 mg, Oral, BID PRN, Ulyses Amor, PA-C, 1,000 mg at 12/24/16 1101 .  alum & mag hydroxide-simeth (MAALOX/MYLANTA) 200-200-20 MG/5ML suspension 15-30 mL, 15-30 mL, Oral, Q2H PRN, Ulyses Amor, PA-C .  bisacodyl (DULCOLAX) suppository 10 mg, 10 mg, Rectal, Daily PRN, Loistine Chance, MD .  clopidogrel (PLAVIX) tablet 75 mg, 75 mg, Oral, Daily, Ulyses Amor, PA-C, 75 mg at 12/30/16 0943 .  diazepam (VALIUM) tablet 2.5 mg, 2.5 mg, Oral, QHS PRN, Ulyses Amor, PA-C, 2.5 mg at 12/28/16 0035 .  enoxaparin (LOVENOX) injection 30 mg, 30 mg, Subcutaneous, Q24H, Emma M Collins, PA-C, 30 mg at 12/30/16 1250 .  feeding  supplement (ENSURE ENLIVE) (ENSURE ENLIVE) liquid 237 mL, 237 mL, Oral, BID BM, Serafina Mitchell, MD, 237 mL at 12/30/16 1000 .  fentaNYL (DURAGESIC - dosed mcg/hr) 12.5 mcg, 12.5 mcg, Transdermal, Q72H, Loistine Chance, MD, 12.5 mcg at 12/28/16 1030 .  gabapentin (NEURONTIN) capsule 600 mg, 600 mg, Oral, TID, Ulyses Amor, PA-C, 600 mg at 12/30/16 0944 .  guaiFENesin-dextromethorphan (ROBITUSSIN DM) 100-10 MG/5ML syrup 15 mL, 15 mL, Oral, Q4H PRN, Ulyses Amor, PA-C .  hydrochlorothiazide 10 mg/mL oral suspension 6.25 mg, 6.25 mg, Oral, Daily, Ulyses Amor, PA-C, 6.25 mg at 12/30/16 0944 .  HYDROmorphone (DILAUDID) injection 0.5 mg, 0.5 mg, Intravenous, Q2H PRN, Micheline Rough, MD, 0.5 mg at 12/29/16 1127 .  irbesartan (AVAPRO) tablet 75 mg, 75 mg, Oral, Daily, 75 mg at 12/30/16 0943 **AND** [DISCONTINUED] hydrochlorothiazide 10 mg/mL oral suspension 6.25 mg, 6.25 mg, Oral, Daily, Skeet Simmer, RPH, 6.25 mg at 12/14/16 1200 .  labetalol (NORMODYNE,TRANDATE) injection 10 mg, 10 mg, Intravenous, Q10 min PRN, Ulyses Amor, PA-C, 10 mg at 12/19/16 0056 .  lactated ringers infusion, , Intravenous, Continuous, Roberts Gaudy, MD, Last Rate: 50 mL/hr at 12/17/16 1032 .  lactated ringers infusion, , Intravenous, Continuous, Oleta Mouse, MD, Stopped at 12/23/16 1234 .  loperamide (IMODIUM) capsule 2 mg, 2 mg, Oral, PRN, Ulyses Amor, PA-C .  magnesium sulfate IVPB 2 g 50 mL, 2 g, Intravenous, Daily PRN, Ulyses Amor, PA-C .  metoprolol (LOPRESSOR) injection 2-5 mg, 2-5 mg, Intravenous, Q2H PRN, Ulyses Amor, PA-C .  metoprolol (LOPRESSOR) tablet 50 mg, 50 mg, Oral, Daily, Ulyses Amor, PA-C, 50 mg at 12/30/16 0944 .  metoprolol tartrate (LOPRESSOR) tablet 25 mg, 25 mg, Oral, QHS, Skeet Simmer, RPH, 25 mg at 12/29/16 2104 .  multivitamin with minerals tablet 1 tablet, 1 tablet, Oral, Daily, Serafina Mitchell, MD, 1 tablet at 12/30/16 [phone] .  ondansetron (ZOFRAN) injection 4 mg, 4 mg,  Intravenous, Q6H PRN, Ulyses Amor, PA-C .  oxyCODONE (Oxy IR/ROXICODONE) immediate release tablet 5 mg, 5 mg, Oral, Q2H PRN, Loistine Chance, MD, 5 mg at 12/30/16 1250 .  pantoprazole (PROTONIX) EC tablet 40 mg, 40 mg, Oral, Daily, Ulyses Amor, PA-C, 40 mg at 12/30/16 0944 .  phenol (CHLORASEPTIC) mouth spray 1 spray, 1 spray, Mouth/Throat, PRN, Ulyses Amor, PA-C .  senna (SENOKOT) tablet 8.6 mg, 1 tablet, Oral, BID, Micheline Rough, MD, 8.6 mg at 12/30/16 0943 .  silver nitrate applicators applicator 5 Stick, 5 Stick, Topical, PRN, Ulyses Amor, PA-C, 5 Stick at 12/13/16 [phone]  Patients Current Diet: Diet regular Room service appropriate? Yes; Fluid consistency: Thin  Precautions / Restrictions Precautions Precautions: Fall, Other (comment) Precaution Comments: daughter reports pt not to push or pull with Lt elbow more than 5 lbs (s/p total elbow replacement)  Other Brace/Splint: R darco shoe Restrictions Weight Bearing Restrictions: Yes RLE Weight Bearing: Non weight bearing Other Position/Activity Restrictions: WB on heel R foot only in Darco shoe   Has the patient had 2 or more falls or a fall with injury in the past year?No  Prior Activity Level Limited Community (1-2x/wk): Went to church and went out to get her hair done weekly.  Home Assistive Devices / Equipment Home Assistive Devices/Equipment: Radio producer (specify quad or straight), Wheelchair, Environmental consultant (specify type), Eyeglasses, Bedside commode/3-in-1 Home Equipment: Walker - 2 wheels, Bedside commode, Cane - single point, Grab bars - tub/shower, Wheelchair - manual  Prior Device Use: Indicate devices/aids used by the patient prior to current illness, exacerbation or injury? Manual wheelchair and Walker  Prior Functional Level Prior Function Level of Independence: Needs assistance Gait / Transfers Assistance Needed: RW with some family help recently ADL's / Homemaking Assistance Needed: daughter assists with shower  transfers and ADL as needed  Self Care: Did the patient need help bathing, dressing, using the toilet or eating?  Independent  Indoor Mobility: Did the patient need assistance with walking from room to room (with or without device)? Independent  Stairs: Did the patient need assistance with internal or external stairs (with or without device)? Independent  Functional Cognition: Did the patient need help planning regular tasks such as shopping or remembering to take medications? Independent  Current Functional Level Cognition  Overall Cognitive Status: Within Functional Limits for tasks assessed Orientation Level: Oriented X4 General Comments: Mildly confused at times. Has been using PCA and IV pain medication today.    Extremity Assessment (includes Sensation/Coordination)  Upper Extremity Assessment:  (see initial eval, pt with bilat limited function)  Lower Extremity Assessment: LLE deficits/detail LLE Deficits / Details: grossly 3-/5    ADLs  Overall ADL's : Needs assistance/impaired Eating/Feeding: Set up, Sitting Grooming: Set up, Supervision/safety, Bed level Upper Body Bathing: Minimal assistance, Sitting Lower Body Bathing: Maximal assistance, Bed level Upper Body Dressing : Min guard, Sitting Lower Body Dressing: Total assistance Lower Body Dressing Details (indicate cue type and reason): to don L sock at bed level Toilet Transfer: Maximal assistance, +2 for physical assistance, Stand-pivot Toilet Transfer Details (indicate cue type and reason): Simulated by stand pivot to chair Toileting- Clothing Manipulation and Hygiene: +2 for physical assistance, Maximal assistance, +2 for safety/equipment, Bed level Functional mobility during ADLs: Maximal assistance, +2 for physical assistance (for stand pivot only) General ADL Comments: Pt seen for ADL retraining session this morning with focus on bed mobility, sitting EOB/balance x72min at supervision level after sitting  EOB to encourange increased activity tolerance. Pt requesting pain medication at begining of OT, RN staff made aware. After ~6-39min, pt requesting getting back to bed reporting that stump begins to throb and "I get lightheaded". Pt is a good candidate for CIR Rehab is hoepful to go later today if medically able. Pt/family were educated verbally in transfers (SPT vs sliding board) and pt is agreeable to this when she gets to Rehab.  Need to update acute OT goals.    Mobility  Overal bed mobility: Needs Assistance Bed Mobility: Sit to Supine Rolling: Min assist Sidelying to sit: Mod assist Supine to sit: Min assist Sit to supine: Mod assist General bed mobility comments: pt able to scoot self back on bed and manage both LEs back into bed with minA    Transfers  Overall transfer level: Needs assistance Equipment used:  (1 person lift with gait belt) Transfer via Lift Equipment:  (maximove pad underneath pt in chair for back to bed) Transfers: Sit to/from Stand, [employer] Transfers Sit to Stand: Max assist (or modAx2) Stand pivot transfers: Max assist (or modAx2) General transfer comment: max directional v/c's to hold onto PT to prevent pt from reaching impulsively  and retropulsion. unable to achieve L knee terminal extension, knee and foot blocked. pt c/o L LE pain and fear of falling. completed with 1 person as pt impulsive and can't focus, easier to keep pt on task and elimate impulsivity    Ambulation / Gait / Stairs / Wheelchair Mobility  Ambulation/Gait General Gait Details: unable    Posture / Balance Balance Overall balance assessment: Needs assistance Sitting-balance support: No upper extremity supported Sitting balance-Leahy Scale: Fair Postural control: Posterior lean Standing balance support: Bilateral upper extremity supported Standing balance-Leahy Scale: Zero Standing balance comment: requires physical assist, unable to tolerate R LE WBing    Special needs/care  consideration BiPAP/CPAP No CPM No Continuous Drip IV No Dialysis No         Life Vest No Oxygen No Special Bed No Trach Size No Wound Vac (area) No      Skin Surgical right BKA site with dressing.  Has buttocks breakdown.                              Bowel mgmt: Last BM 12/30/16 Bladder mgmt: Voiding on bedpan and on [facility] with assistance Diabetic mgmt No    Previous Home Environment Living Arrangements: Spouse/significant other Available Help at Discharge: Family, Available 24 hours/day Type of Home: House Home Layout: One level Home Access: Ramped entrance Bathroom Shower/Tub: Multimedia programmer: Standard Bathroom Accessibility: Yes How Accessible: Accessible via wheelchair [place]: No Additional Comments: pt family is planning for 24/7 supervision  Discharge Living Setting Plans for Discharge Living Setting: Patient's home, House, Lives with (comment) (Lives with husband.) Type of Home at Discharge: House Discharge Home Layout: One level Discharge Home Access: [place] entrance Does the patient have any problems obtaining your medications?: No  Social/Family/Support Systems Patient Roles: Spouse, Parent (Has a spouse and a daughter.) Contact Information: Jimya Brassell - daughter Anticipated Caregiver: Daughter and husband Anticipated Caregiver's Contact Information: [name] - daughter - [phone]  Ability/Limitations of Caregiver: Husband is retired and can provide supervision but not physical lifting.  Daughter works at the outpatient surgical center but can assist when not working. Caregiver Availability: 24/7 Discharge Plan Discussed with Primary Caregiver: Yes Is Caregiver In Agreement with Plan?: Yes Does Caregiver/Family have Issues with Lodging/Transportation while Pt is in Rehab?: No  Goals/Additional Needs Patient/Family Goal for Rehab: PT mod I, OT mod I and supervision goals Expected length of stay: 7-10 days Cultural  Considerations: [employer] Dietary Needs: Regular diet, thin liquids Equipment Needs: TBD Pt/Family Agrees to Admission and willing to participate: Yes Program Orientation Provided & Reviewed with Pt/Caregiver Including Roles  & Responsibilities: Yes  Decrease burden of Care through IP rehab admission: N/A  Possible need for SNF placement upon discharge: Not anticipated  Patient Condition: This patient's medical and functional status has changed since the consult dated: 12/12/16 in which the Rehabilitation Physician determined and documented that the patient's condition is appropriate for intensive rehabilitative care in an inpatient rehabilitation facility. See "History of Present Illness" (above) for medical update. Functional changes are: Currently requiring mod to max assist for transfers. Patient's medical and functional status update has been discussed with the Rehabilitation physician and patient remains appropriate for inpatient rehabilitation. Will admit to inpatient rehab today.  Preadmission Screen Completed By:  Retta Diones, 12/30/2016 2:52 PM ______________________________________________________________________   Discussed status with Dr. Naaman Plummer on 12/30/16 at 1451 and received telephone approval for admission today.  Admission Coordinator:  Retta Diones, time1451/Date2/13/18       Cosigned by: Meredith Staggers, MD at 12/30/2016 3:28 PM

## 2016-12-31 NOTE — Progress Notes (Signed)
Physical Therapy Session Note  Patient Details  Name: [name] MRN: [id number] Date of Birth: Oct 03, 1928  Today's Date: 12/31/2016 PT Individual Time: 1300-1415 PT Individual Time Calculation (min): 75 min   Short Term Goals: Week 1:  PT Short Term Goal 1 (Week 1): Pt will transfer w/c <>bed minA PT Short Term Goal 2 (Week 1): Pt will perform sit <>stand modA PT Short Term Goal 3 (Week 1): Pt will perform standing tolerance 2 min with minA PT Short Term Goal 4 (Week 1): Pt will initiate transfer training to/from car PT Short Term Goal 5 (Week 1): Pt will perform bed mobility with S  Skilled Therapeutic Interventions/Progress Updates: Pt received seated in w/c, c/o pain 5/10 in residual limb RLE and agreeable to treatment. Sit <>stand in parallel bars with mod/maxA x3 trials; cues for upright posture and LLE extension. Squat pivot transfer w/c <>recliner with maxA. Car transfer performed squat pivot modA. W/c >drop arm BSC squat pivot modA; performed hygiene without assist, wearing gown managed by pt's daughter. Transfer BSC>bed modA. Sit >supine minA, and totalA for scooting toward HOB to reposition comfortably in bed. Pt's sister and daughter present for session; provided education throughout session regarding goals, estimated LOS, DME, etc. Pt remained supine in bed at end of session, family present and all needs in reach.      Therapy Documentation Precautions:  Precautions Precautions: Fall, Other (comment) Precaution Comments: pt not to push or pull with Lt elbow more than 5 lbs (s/p total elbow replacement)  Restrictions Weight Bearing Restrictions: Yes RLE Weight Bearing: Non weight bearing   See Function Navigator for Current Functional Status.   Therapy/Group: Individual Therapy  [name] 12/31/2016, 4:16 PM

## 2016-12-31 NOTE — Evaluation (Signed)
Physical Therapy Assessment and Plan  Patient Details  Name: [name] MRN: [id number] Date of Birth: [birth year]  PT Diagnosis: Abnormal posture, Difficulty walking, Edema, Impaired sensation, Muscle weakness and Pain in RLE residual limb Rehab Potential: Fair ELOS: 2 weeks   Today's Date: 81/14/2018 PT Individual Time: 0800-0900 PT Individual Time Calculation (min): 60 min    Problem List:  Patient Active Problem List   Diagnosis Date Noted  . Status post below knee amputation of right lower extremity ([place]) 12/30/2016  . Acute on chronic combined systolic and diastolic CHF (congestive heart failure) ([place]) 12/22/2016  . Anemia 12/22/2016  . Palliative care encounter   . Lower extremity pain, right   . Acute GI bleeding 10/23/2016  . Paroxysmal atrial fibrillation ([place]) 10/03/2016  . 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 PAD (peripheral artery disease) ([place]) 09/05/2016  . Groin pain 04/02/2015  . Aftercare following surgery of the circulatory system, [place] 12/13/2013  . Shingles 10/26/2013  . Anxiety 10/26/2013  . Acute blood loss anemia 09/05/2013  . Elbow fracture, left 09/05/2013  . Left acetabular fracture ([place]) 09/05/2013  . Ankle fracture, left 09/05/2013  . HTN (hypertension) 09/03/2013  . HLD (hyperlipidemia) 09/03/2013  . Peripheral vascular disease, unspecified 05/10/2012  . Chronic total occlusion of artery of the extremities ([place]) 01/19/2012    Past Medical History:  Past Medical History:  Diagnosis Date  . Anemia   . Anginal pain ([place])   . Arthritis    "qwhere" (09/05/2016)  . Atrial fibrillation ([place]) 09/2016  . Chronic lower back pain   . Complication of anesthesia    "takes a long time for it to wear off; I can hallucinate if I take too much" (09/05/2016)  . DVT (deep venous thrombosis) ([place]) 10/2009  . Fall from steps 08/31/2013   Fx. pelvis, Left Hip, Left Elbow  .  Fibromyalgia   . GERD (gastroesophageal reflux disease)    09/22/16- "no too much anymore"  . GI bleed 10/24/2016  . High cholesterol   . History of blood transfusion   . History of hiatal hernia   . Hypertension   . Macular degeneration, wet ([place])    "started in right eye; now legally blind in that eye; now started in left eye but pretty much in control" (09/05/2016)  . Osteoporosis   . Peripheral vascular disease ([place])    nonviable tissue Right foot  . PONV (postoperative nausea and vomiting)   . Squamous cell carcinoma of skin of right calf Aug. 2015  . Stroke ([place])    TIA's no residual  . TIA (transient ischemic attack)    "several at once; none in a long time" (09/05/2016)   Past Surgical History:  Past Surgical History:  Procedure Laterality Date  . ABDOMINAL AORTAGRAM N/A 12/26/2014   Procedure: ABDOMINAL Maxcine Ham;  Surgeon: Serafina Mitchell, MD;  [facility] C[facility] LAB;  Service: Cardiovascular;  Laterality: N/A;  . AMPUTATION Right 12/23/2016   Procedure: AMPUTATION BELOW KNEE;  Surgeon: Serafina Mitchell, MD;  Location: [place];  Service: Vascular;  Laterality: Right;  . AORTOGRAM N/A 09/26/2016   Procedure: AORTOGRAM;  Surgeon: Waynetta Sandy, MD;  Location: [place];  Service: Vascular;  Laterality: N/A;  . CARPAL TUNNEL RELEASE Right   . CATARACT EXTRACTION W/ INTRAOCULAR LENS  IMPLANT, BILATERAL Bilateral   . COLONOSCOPY    . DILATION AND CURETTAGE OF UTERUS    . EYE SURGERY  Right    "macular OR"  . FEMORAL ARTERY STENT  12-11-10   Left SFA  . FEMORAL-POPLITEAL BYPASS GRAFT Left 09/24/2016   Procedure: REDO FEMORAL TO POPLITEAL ARTERY BYPASS GRAFT USING 6MM PROPATEN RINGED GORTEX GRAFT;  Surgeon: Serafina Mitchell, MD;  Location: [place];  Service: Vascular;  Laterality: Left;  . I&D EXTREMITY Right 12/17/2016   Procedure: IRRIGATION AND DEBRIDEMENT RIGHT FOOT;  Surgeon: Serafina Mitchell, MD;  Location: [place];  Service: Vascular;  Laterality: Right;  . INCISION AND  DRAINAGE OF WOUND Left 10/25/2009   leg/notes 11/13/2009  . INSERTION OF ILIAC STENT Left 12/26/2014   Procedure: INSERTION OF ILIAC STENT;  Surgeon: Serafina Mitchell, MD;  Location: [facility] CATH LAB;  Service: Cardiovascular;  Laterality: Left;  . INSERTION OF ILIAC STENT Left 09/26/2016   Procedure: SUB INTIMAL INSERTION OF SUPERFICIAL FEMORAL ARTERY AND BELOW KNEE BYPASS GRAFT;  Surgeon: Waynetta Sandy, MD;  Location: [place];  Service: Vascular;  Laterality: Left;  . JOINT REPLACEMENT     knee  . JOINT REPLACEMENT Left Oct. 17, 2014   Elbow ( pt fell 08-31-13 )  . ORIF SHOULDER FRACTURE Right    "it was crushed"  . PERIPHERAL VASCULAR CATHETERIZATION N/A 10/30/2015   Procedure: Abdominal Aortogram w/Lower Extremity;  Surgeon: Serafina Mitchell, MD;  Location: [place] CV LAB;  Service: Cardiovascular;  Laterality: N/A;  . PERIPHERAL VASCULAR CATHETERIZATION  10/30/2015   Procedure: Peripheral Vascular Intervention;  Surgeon: Serafina Mitchell, MD;  Location: [place] CV LAB;  Service: Cardiovascular;;  . PERIPHERAL VASCULAR CATHETERIZATION N/A 04/01/2016   Procedure: Abdominal Aortogram w/Lower Extremity;  Surgeon: Serafina Mitchell, MD;  Location: [place] CV LAB;  Service: Cardiovascular;  Laterality: N/A;  . PERIPHERAL VASCULAR CATHETERIZATION Left 04/01/2016   Procedure: Peripheral Vascular Atherectomy;  Surgeon: Serafina Mitchell, MD;  Location: [place] CV LAB;  Service: Cardiovascular;  Laterality: Left;  Superficial femoral artery.  Marland Kitchen PERIPHERAL VASCULAR CATHETERIZATION Right 09/05/2016   "stent"  . PERIPHERAL VASCULAR CATHETERIZATION N/A 09/05/2016   Procedure: Abdominal Aortogram w/Lower Extremity;  Surgeon: Serafina Mitchell, MD;  Location: [place] CV LAB;  Service: Cardiovascular;  Laterality: N/A;  . PERIPHERAL VASCULAR CATHETERIZATION Right 09/05/2016   Procedure: Peripheral Vascular Intervention;  Surgeon: Serafina Mitchell, MD;  Location: [place] CV LAB;  Service:  Cardiovascular;  Laterality: Right;  Superficial Femoral  . PERIPHERAL VASCULAR CATHETERIZATION Left 09/09/2016   Procedure: Lower Extremity Angiography;  Surgeon: Serafina Mitchell, MD;  Location: [place] CV LAB;  Service: Cardiovascular;  Laterality: Left;  . PR VEIN BYPASS GRAFT,AORTO-FEM-POP  09-13-09   Left Fem-pop  . THROMBECTOMY FEMORAL ARTERY Right 09/26/2016   Procedure: Thromboembolectomy Right Lower Extremity, Right Femoral Artery Endarterectomy with Patch Angioplasty; Right Lower Extremity Angiogram ;  Surgeon: Waynetta Sandy, MD;  Location: [place];  Service: Vascular;  Laterality: Right;  . TOTAL ELBOW ARTHROPLASTY Left 09/03/2013   Procedure: LEFT TOTAL ELBOW ARTHROPLASTY;  Surgeon: Roseanne Kaufman, MD;  Location: [place];  Service: Orthopedics;  Laterality: Left;  . TOTAL KNEE ARTHROPLASTY Left 06/2006  . TRANSMETATARSAL AMPUTATION Right 12/11/2016   Procedure: TRANSMETATARSAL AMPUTATION;  Surgeon: Serafina Mitchell, MD;  Location: [place];  Service: Vascular;  Laterality: Right;  . TUBAL LIGATION    . VAGINAL HYSTERECTOMY      Assessment & Plan Clinical Impression: FAYETTE GASNER a 83 y.o.right handed femalewith history of right elbow replacement andatrial fibrillation maintained on Plavix and aspirin,  GI bleed, chronic low back pain, fibromyalgia, CRI stage III creatinine 1.07-1.19, hypertension, peripheral vascular disease with history of femoral-popliteal bypass grafting 09/24/2016.Per chart review patient lives with spouse. Used a cane and/or walker when needed. One level home with. Daughter can assist.Presented 12/10/2016 with chronic right toe wound with increasing pain and ischemic changes followed by vascular surgery. No change with conservative care and underwent right transmetatarsal amputation 12/11/2016 per [name]. [facility] course pain management. Subcutaneous Lovenox for DVT prophylaxis. Acute on chronic anemia 7.9and monitored. Progressive ischemic  changes of right TMA with nonviable tissue surrounding incision. The calf was warm to touch and followed closely by vascular surgery. Poor healing noted and underwentright BKA 12/23/2016.Plavix resumed as per cardiology services. Palliative care has been following for goals of care. Therapies initiated with slow progressive gains. Recommendations for physical medicine rehabilitation consult. Patient was admitted for a comprehensive rehabilitation program. Patient transferred to CIR on 12/30/2016 .   Patient currently requires max with mobility secondary to muscle weakness, decreased cardiorespiratoy endurance and decreased sitting balance, decreased standing balance, decreased postural control, decreased balance strategies and difficulty maintaining precautions.  Prior to hospitalization, patient was supervision with mobility and lived with Spouse in a House home.  Home access is  Ramped entrance.  Patient will benefit from skilled PT intervention to maximize safe functional mobility, minimize fall risk and decrease caregiver burden for planned discharge home with 24 hour assist.  Anticipate patient will benefit from follow up [facility] at discharge.  PT - End of Session Activity Tolerance: Tolerates 30+ min activity with multiple rests Endurance Deficit: Yes Endurance Deficit Description: fatigues quickly with short duration mobility activities, requires seated rest breaks PT Assessment Rehab Potential (ACUTE/IP ONLY): Fair Barriers to Discharge: Decreased caregiver support (husband can only provide minimal physical assist) PT Patient demonstrates impairments in the following area(s): Balance;Edema;Endurance;Motor;Nutrition;Pain;Safety;Sensory;Skin Integrity PT Transfers Functional Problem(s): Bed Mobility;Bed to Chair;Car PT Locomotion Functional Problem(s): Wheelchair Mobility PT Plan PT Intensity: Minimum of 1-2 x/day ,45 to 90 minutes PT Frequency: 5 out of 7 days PT Treatment/Interventions:  Discharge planning;Balance/vestibular training;Ambulation/gait training;Functional mobility training;Therapeutic Activities;Therapeutic Exercise;UE/LE Strength taining/ROM;UE/LE Coordination activities;Pain management;Patient/family education;Functional electrical stimulation;Community reintegration;DME/adaptive equipment instruction;Disease management/prevention;Neuromuscular re-education;Wheelchair propulsion/positioning;Psychosocial support;Skin care/wound management PT Transfers Anticipated Outcome(s): S squat pivot transfer PT Locomotion Anticipated Outcome(s): S w/c mobility, minA sit <>stand with RW PT Recommendation Recommendations for Other Services: Therapeutic Recreation consult Therapeutic Recreation Interventions: Kitchen group;Pet therapy Follow Up Recommendations: Home health PT Patient destination: Home Equipment Recommended: To be determined Equipment Details: has RW, rollator, w/c  Skilled Therapeutic Intervention Pt received supine in bed, son present. C/o pain as below and agreeable to treatment. Assessed all mobility as described below with S>modA for bed mobility and transfers, maxA sit <>stand. Educated pt and son in rehab process, goals, estimated LOS, and falls prevention safety; pt and son in agreement with all the above. Pt remained seated in w/c at end of session, family present and all needs in reach.   PT Evaluation Precautions/Restrictions Precautions Precautions: Fall;Other (comment) Precaution Comments: pt not to push or pull with Lt elbow more than 5 lbs (s/p total elbow replacement)  Restrictions RLE Weight Bearing: Non weight bearing General Chart Reviewed: Yes Response to Previous Treatment: Not applicable Family/Caregiver Present: Yes  Pain Pain Assessment Pain Assessment: 0-10 Pain Score: 5  Pain Type: Surgical pain;Phantom pain Pain Location: Leg Pain Orientation: Right Pain Descriptors / Indicators: Shooting;Sharp;Aching Pain Onset: With  Activity Patients Stated Pain Goal: 2 Pain Intervention(s): Other (Comment) (pre-medicated) Multiple Pain  Sites: No Home Living/Prior Functioning Home Living Available Help at Discharge: Family;Available 24 hours/day Type of Home: House Home Access: Ramped entrance Home Layout: One level Bathroom Shower/Tub: Walk-in shower;Other (comment);Door (has 2 BSC - one in shower and one over toilet) Bathroom Toilet: Standard Bathroom Accessibility: Yes  Lives With: Spouse Prior Function Level of Independence: Independent with basic ADLs;Independent with transfers;Independent with gait Vocation: Retired Oceanographer Comments: WFL  Cognition Overall Cognitive Status: Within Functional Limits for tasks assessed Arousal/Alertness: Awake/alert Memory: Appears intact Sensation Sensation Light Touch: Impaired by gross assessment Stereognosis: Appears Intact Hot/Cold: Appears Intact Proprioception: Appears Intact Additional Comments: pt reports LLE numbness, but it is very sensitive to touch Coordination Gross Motor Movements are Fluid and Coordinated: No Fine Motor Movements are Fluid and Coordinated: Yes Coordination and Movement Description: limited by pain Finger Nose Finger Test: not tested Motor  Motor Motor - Skilled Clinical Observations: generalized muscle weakness  Mobility Bed Mobility Bed Mobility: Supine to Sit Supine to Sit: HOB elevated;With rails;5: Supervision Supine to Sit Details: Verbal cues for technique;Verbal cues for sequencing Transfers Transfers: Yes Sit to Stand: 2: Max assist;With upper extremity assist Sit to Stand Details: Verbal cues for sequencing;Verbal cues for technique;Tactile cues for weight shifting;Tactile cues for posture;Manual facilitation for weight shifting Stand to Sit: 2: Max assist Stand to Sit Details (indicate cue type and reason): Verbal cues for technique;Tactile cues for weight shifting;Verbal cues for  precautions/safety;Tactile cues for sequencing Stand to Sit Details: maxA for eccentric control Squat Pivot Transfers: 3: Mod assist;With upper extremity assistance Squat Pivot Transfer Details: Verbal cues for sequencing;Verbal cues for technique;Tactile cues for weight shifting;Tactile cues for posture;Tactile cues for placement;Manual facilitation for weight shifting;Manual facilitation for placement Locomotion  Ambulation Ambulation: No Gait Gait: No Stairs / Additional Locomotion Stairs: No Wheelchair Mobility Wheelchair Mobility: Yes Wheelchair Assistance: 4: Advertising account executive Details: Verbal cues for technique;Tactile cues for placement Wheelchair Propulsion: Both upper extremities Wheelchair Parts Management: Needs assistance Distance: 81'  Trunk/Postural Assessment  Cervical Assessment Cervical Assessment: Within Functional Limits Thoracic Assessment Thoracic Assessment: Within Functional Limits Lumbar Assessment Lumbar Assessment: Exceptions to [facility] (posterior pelvic tilt) Postural Control Postural Control: Within Functional Limits (WFL in sitting)  Balance Balance Balance Assessed: Yes Static Sitting Balance Static Sitting - Balance Support: No upper extremity supported Static Sitting - Level of Assistance: 6: Modified independent (Device/Increase time) Dynamic Sitting Balance Dynamic Sitting - Balance Support: No upper extremity supported;Feet supported Dynamic Sitting - Level of Assistance: 5: Stand by assistance Dynamic Sitting - Balance Activities: Forward lean/weight shifting;Lateral lean/weight shifting;Reaching across midline Static Standing Balance Static Standing - Balance Support: Bilateral upper extremity supported Static Standing - Level of Assistance: 2: Max assist Static Standing - Comment/# of Minutes: unable to come to full stand; maxA to prevent posterior LOB Extremity Assessment  RUE Assessment RUE Assessment: Exceptions to  [facility] RUE AROM (degrees) RUE Overall AROM Comments: R shoulder AROM limited to 50- 60 degrees from fx in 2014 LUE Assessment LUE Assessment: Exceptions to WFL LUE AROM (degrees) LUE Overall AROM Comments: WFL, no weight bearing through elbow of more than 5 lbs from total elbow arthroplasty RLE Assessment RLE Assessment: Exceptions to [facility] (grossly 3/5 at hip, knee, no resistance applied d/t pain, ankle MMT N/A) LLE Assessment LLE Assessment: Exceptions to [facility] (grossly 4-/5)   See Function Navigator for Current Functional Status.   Refer to Care Plan for Long Term Goals  Recommendations for other services: Therapeutic Recreation  Pet therapy and [employer]  group  Discharge Criteria: Patient will be discharged from PT if patient refuses treatment 3 consecutive times without medical reason, if treatment goals not met, if there is a change in medical status, if patient makes no progress towards goals or if patient is discharged from hospital.  The above assessment, treatment plan, treatment alternatives and goals were discussed and mutually agreed upon: by patient and by family  [name] 12/31/2016, 10:27 AM

## 2016-12-31 NOTE — Progress Notes (Signed)
[place] PHYSICAL MEDICINE & REHABILITATION     PROGRESS NOTE    Subjective/Complaints: Did not fall asleep until 0330 due to phantom limb pain. Denies any other new issues  ROS: pt denies nausea, vomiting, diarrhea, cough, shortness of breath or chest pain   Objective: Vital Signs: Blood pressure (!) 151/56, pulse 73, temperature 97.8 F (36.6 C), temperature source Oral, resp. rate 19, height 5\' 2"  (1.575 m), weight 54.8 kg (120 lb 13 oz), SpO2 97 %. No results found.  Recent Labs  12/30/16 2112 12/31/16 0534  WBC 10.1 8.7  HGB 7.9* 7.5*  HCT 25.0* 23.8*  PLT 570* 544*    Recent Labs  12/31/16 0534  NA 136  K 3.6  CL 100*  GLUCOSE 98  BUN 11  CREATININE 0.94  CALCIUM 9.0   CBG (last 3)  No results for input(s): GLUCAP in the last 72 hours.  Wt Readings from Last 3 Encounters:  12/31/16 54.8 kg (120 lb 13 oz)  12/30/16 48.2 kg (106 lb 3.2 oz)  12/01/16 58.2 kg (128 lb 6.4 oz)    Physical Exam:  HENT: oral mucosa pink Head: Normocephalic.  Eyes: EOMare normal. Left eye exhibits no discharge.  Neck: Normal range of motion. Neck supple. No thyromegalypresent.  Cardiovascular: rrr Respiratory: cta b, no wheezes GI: Soft. Bowel sounds are normal. She exhibits no distension. There is no tenderness.  Psychiatric: She has a normal mood and affect. Her behavior is normal.  Skin. BKA incision intact and well approximated. Minimal s/s drainage lateral incision Musculoskeletal. Arthritic changes in both hands. Right BK edematous and sensitive to touch. Able to fully extend the knee while supine.  Neurological: She is oriented to person, place, and time. No cranial nerve deficit.   Intrinsic minus left foot. Stocking glove sensory loss up to leftknee and to a lesser extent in the hands. UE remains 4-5/5 with some weakness in both hands. LLE grossly 3-4/5 proximal to distal  Assessment/Plan: 1. Functional deficits secondary to right BKA which require 3+ hours  per day of interdisciplinary therapy in a comprehensive inpatient rehab setting. Physiatrist is providing close team supervision and 24 hour management of active medical problems listed below. Physiatrist and rehab team continue to assess barriers to discharge/monitor patient progress toward functional and medical goals.  Function:  Bathing Bathing position      Bathing parts      Bathing assist        Upper Body Dressing/Undressing Upper body dressing                    Upper body assist        Lower Body Dressing/Undressing Lower body dressing                                  Lower body assist        Toileting Toileting          Toileting assist     Transfers Chair/bed transfer             Locomotion Ambulation           Wheelchair          Cognition Comprehension Comprehension assist level: Follows complex conversation/direction with extra time/assistive device  Expression Expression assist level: Expresses complex ideas: With extra time/assistive device  Social Interaction Social Interaction assist level: Interacts appropriately with others - No medications needed.  Problem  Solving Problem solving assist level: Solves complex problems: With extra time  Memory Memory assist level: Complete Independence: No helper   Medical Problem List and Plan: 1. Decreased functional mobilitysecondary to right BKA 12/23/2016 -begin therapies today as tolerated 2. DVT Prophylaxis/Anticoagulation: Subcutaneous Lovenox. Monitor platelet counts and any signs of bleeding 3. Pain Management/fibromyalgia: Neurontin 600 mg 3 times a day----has used this long term at home for neuropathic pain in legs---increase 2000 dose to 900mg --observe neuro status closely  -continue Duragesic patch 12.5 g, oxycodone as needed 4. Mood: Valium 2.5 mg daily at bedtime as needed anxiety 5. Neuropsych: This patient iscapable of making decisions on  herown behalf. 6. Skin/Wound Care: wound needs to be dressed and wrapped with ACE 7. Fluids/Electrolytes/Nutrition: I personally reviewed the patient's labs today.  8.Acute blood loss anemia. Follow-up CBC with hgb down to 7.5---no signs of acute blood loss  -follow up on Friday 9.Atrial fibrillation. Lopressor 50 mg daily and 25 mg daily at bedtime. Cardiac rate control. Follow-up per cardiology services 10.Hypertension. Avapro 75 mg daily, HCTZ 6.25 mg daily. Controlled at present 11.CRI stage III. Creatinine baseline 1.07-1.19. Cr 0.94 today 12.History of GI bleed. Continue Protonix 13.Constipation. Laxative assistance 14.Decreased nutritional storage. Dietary follow-up 15. Hx of right elbow replacement 2015---5 lb weight limit through elbow   LOS (Days) 1 A FACE TO FACE EVALUATION WAS PERFORMED  Meredith Staggers, MD 12/31/2016 8:34 AM

## 2016-12-31 NOTE — Progress Notes (Signed)
Patient information reviewed and entered into eRehab system by Jonel Sick, RN, CRRN, PPS Coordinator.  Information including medical coding and functional independence measure will be reviewed and updated through discharge.     Per nursing patient was given "Data Collection Information Summary for Patients in Inpatient Rehabilitation Facilities with attached "Privacy Act Statement-Health Care Records" upon admission.  

## 2016-12-31 NOTE — Evaluation (Signed)
Occupational Therapy Assessment and Plan  Patient Details  Name: [name] MRN: [id number] Date of Birth: 1928/06/29  OT Diagnosis: acute pain and muscle weakness (generalized) Rehab Potential: Rehab Potential (ACUTE ONLY): Good ELOS: 14-16 days   Today's Date: 12/31/2016 OT Individual Time: 0973-5329 OT Individual Time Calculation (min): 70 min     Problem List:  Patient Active Problem List   Diagnosis Date Noted  . Status post below knee amputation of right lower extremity ([place]) 12/30/2016  . Acute on chronic combined systolic and diastolic CHF (congestive heart failure) ([place]) 12/22/2016  . Anemia 12/22/2016  . Palliative care encounter   . Lower extremity pain, right   . Acute GI bleeding 10/23/2016  . Paroxysmal atrial fibrillation ([place]) 10/03/2016  . 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 PAD (peripheral artery disease) ([place]) 09/05/2016  . Groin pain 04/02/2015  . Aftercare following surgery of the circulatory system, [place] 12/13/2013  . Shingles 10/26/2013  . Anxiety 10/26/2013  . Acute blood loss anemia 09/05/2013  . Elbow fracture, left 09/05/2013  . Left acetabular fracture ([place]) 09/05/2013  . Ankle fracture, left 09/05/2013  . HTN (hypertension) 09/03/2013  . HLD (hyperlipidemia) 09/03/2013  . Peripheral vascular disease, unspecified 05/10/2012  . Chronic total occlusion of artery of the extremities ([place]) 01/19/2012    Past Medical History:  Past Medical History:  Diagnosis Date  . Anemia   . Anginal pain ([place])   . Arthritis    "qwhere" (09/05/2016)  . Atrial fibrillation ([place]) 09/2016  . Chronic lower back pain   . Complication of anesthesia    "takes a long time for it to wear off; I can hallucinate if I take too much" (09/05/2016)  . DVT (deep venous thrombosis) ([place]) 10/2009  . Fall from steps 08/31/2013   Fx. pelvis, Left Hip, Left Elbow  . Fibromyalgia   . GERD  (gastroesophageal reflux disease)    09/22/16- "no too much anymore"  . GI bleed 10/24/2016  . High cholesterol   . History of blood transfusion   . History of hiatal hernia   . Hypertension   . Macular degeneration, wet ([place])    "started in right eye; now legally blind in that eye; now started in left eye but pretty much in control" (09/05/2016)  . Osteoporosis   . Peripheral vascular disease ([place])    nonviable tissue Right foot  . PONV (postoperative nausea and vomiting)   . Squamous cell carcinoma of skin of right calf Aug. 2015  . Stroke ([place])    TIA's no residual  . TIA (transient ischemic attack)    "several at once; none in a long time" (09/05/2016)   Past Surgical History:  Past Surgical History:  Procedure Laterality Date  . ABDOMINAL AORTAGRAM N/A 12/26/2014   Procedure: ABDOMINAL Maxcine Ham;  Surgeon: Serafina Mitchell, MD;  Location: [facility] CATH LAB;  Service: Cardiovascular;  Laterality: N/A;  . AMPUTATION Right 12/23/2016   Procedure: AMPUTATION BELOW KNEE;  Surgeon: Serafina Mitchell, MD;  Location: [place];  Service: Vascular;  Laterality: Right;  . AORTOGRAM N/A 09/26/2016   Procedure: AORTOGRAM;  Surgeon: Waynetta Sandy, MD;  Location: [place];  Service: Vascular;  Laterality: N/A;  . CARPAL TUNNEL RELEASE Right   . CATARACT EXTRACTION W/ INTRAOCULAR LENS  IMPLANT, BILATERAL Bilateral   . COLONOSCOPY    . DILATION AND CURETTAGE OF UTERUS    . EYE SURGERY Right    "  macular OR"  . FEMORAL ARTERY STENT  12-11-10   Left SFA  . FEMORAL-POPLITEAL BYPASS GRAFT Left 09/24/2016   Procedure: REDO FEMORAL TO POPLITEAL ARTERY BYPASS GRAFT USING 6MM PROPATEN RINGED GORTEX GRAFT;  Surgeon: Serafina Mitchell, MD;  Location: [place];  Service: Vascular;  Laterality: Left;  . I&D EXTREMITY Right 12/17/2016   Procedure: IRRIGATION AND DEBRIDEMENT RIGHT FOOT;  Surgeon: Serafina Mitchell, MD;  Location: [place];  Service: Vascular;  Laterality: Right;  . INCISION AND DRAINAGE OF WOUND Left  10/25/2009   leg/notes 11/13/2009  . INSERTION OF ILIAC STENT Left 12/26/2014   Procedure: INSERTION OF ILIAC STENT;  Surgeon: Serafina Mitchell, MD;  Location: [facility] CATH LAB;  Service: Cardiovascular;  Laterality: Left;  . INSERTION OF ILIAC STENT Left 09/26/2016   Procedure: SUB INTIMAL INSERTION OF SUPERFICIAL FEMORAL ARTERY AND BELOW KNEE BYPASS GRAFT;  Surgeon: Waynetta Sandy, MD;  Location: [place];  Service: Vascular;  Laterality: Left;  . JOINT REPLACEMENT     knee  . JOINT REPLACEMENT Left Oct. 17, 2014   Elbow ( pt fell 08-31-13 )  . ORIF SHOULDER FRACTURE Right    "it was crushed"  . PERIPHERAL VASCULAR CATHETERIZATION N/A 10/30/2015   Procedure: Abdominal Aortogram w/Lower Extremity;  Surgeon: Serafina Mitchell, MD;  Location: [place] CV LAB;  Service: Cardiovascular;  Laterality: N/A;  . PERIPHERAL VASCULAR CATHETERIZATION  10/30/2015   Procedure: Peripheral Vascular Intervention;  Surgeon: Serafina Mitchell, MD;  Location: [place] CV LAB;  Service: Cardiovascular;;  . PERIPHERAL VASCULAR CATHETERIZATION N/A 04/01/2016   Procedure: Abdominal Aortogram w/Lower Extremity;  Surgeon: Serafina Mitchell, MD;  Location: [place] CV LAB;  Service: Cardiovascular;  Laterality: N/A;  . PERIPHERAL VASCULAR CATHETERIZATION Left 04/01/2016   Procedure: Peripheral Vascular Atherectomy;  Surgeon: Serafina Mitchell, MD;  Location: [place] CV LAB;  Service: Cardiovascular;  Laterality: Left;  Superficial femoral artery.  Marland Kitchen PERIPHERAL VASCULAR CATHETERIZATION Right 09/05/2016   "stent"  . PERIPHERAL VASCULAR CATHETERIZATION N/A 09/05/2016   Procedure: Abdominal Aortogram w/Lower Extremity;  Surgeon: Serafina Mitchell, MD;  Location: [place] CV LAB;  Service: Cardiovascular;  Laterality: N/A;  . PERIPHERAL VASCULAR CATHETERIZATION Right 09/05/2016   Procedure: Peripheral Vascular Intervention;  Surgeon: Serafina Mitchell, MD;  Location: [place] CV LAB;  Service: Cardiovascular;   Laterality: Right;  Superficial Femoral  . PERIPHERAL VASCULAR CATHETERIZATION Left 09/09/2016   Procedure: Lower Extremity Angiography;  Surgeon: Serafina Mitchell, MD;  Location: [place] CV LAB;  Service: Cardiovascular;  Laterality: Left;  . PR VEIN BYPASS GRAFT,AORTO-FEM-POP  09-13-09   Left Fem-pop  . THROMBECTOMY FEMORAL ARTERY Right 09/26/2016   Procedure: Thromboembolectomy Right Lower Extremity, Right Femoral Artery Endarterectomy with Patch Angioplasty; Right Lower Extremity Angiogram ;  Surgeon: Waynetta Sandy, MD;  Location: [place];  Service: Vascular;  Laterality: Right;  . TOTAL ELBOW ARTHROPLASTY Left 09/03/2013   Procedure: LEFT TOTAL ELBOW ARTHROPLASTY;  Surgeon: Roseanne Kaufman, MD;  Location: [place];  Service: Orthopedics;  Laterality: Left;  . TOTAL KNEE ARTHROPLASTY Left 06/2006  . TRANSMETATARSAL AMPUTATION Right 12/11/2016   Procedure: TRANSMETATARSAL AMPUTATION;  Surgeon: Serafina Mitchell, MD;  Location: [place];  Service: Vascular;  Laterality: Right;  . TUBAL LIGATION    . VAGINAL HYSTERECTOMY      Assessment & Plan Clinical Impression: JADI DEYARMIN is a 83 y.o. right handed female with history of right elbow replacement and atrial fibrillation maintained on Plavix and aspirin,  GI bleed, chronic low back pain, fibromyalgia, CRI stage III creatinine 1.07-1.19, hypertension, peripheral vascular disease with history of femoral-popliteal bypass grafting 09/24/2016. Per chart review patient lives with spouse. Used a cane and/or walker when needed. One level home with. Daughter can assist. Presented 12/10/2016 with chronic right toe wound with increasing pain and ischemic changes followed by vascular surgery. No change with conservative care and underwent right transmetatarsal amputation 12/11/2016 per [name]. [facility] course pain management. Subcutaneous Lovenox for DVT prophylaxis. Acute on chronic anemia 7.9 and monitored. Progressive ischemic changes of right  TMA with nonviable tissue surrounding incision. The calf was warm to touch and followed closely by vascular surgery. 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 Plavix resumed as per cardiology services. Palliative care has been following for goals of care. Therapies initiated with slow progressive gains. Recommendations for physical medicine rehabilitation consult. Patient was admitted for a comprehensive rehabilitation program. Patient transferred to CIR on 12/30/2016 .    Patient currently requires max with basic self-care skills secondary to muscle weakness, decreased cardiorespiratoy endurance and decreased standing balance and decreased balance strategies.  Prior to hospitalization, patient stated she was independent with her self care and mobility.  Patient will benefit from skilled intervention to increase independence with basic self-care skills prior to discharge home with care partner.  Anticipate patient will require minimal physical assistance and follow up home health.  OT - End of Session Activity Tolerance: Tolerates < 10 min activity, no significant change in vital signs Endurance Deficit: Yes Endurance Deficit Description: fatigues quickly with short duration mobility activities, requires seated rest breaks OT Assessment Rehab Potential (ACUTE ONLY): Good OT Patient demonstrates impairments in the following area(s): Balance;Pain;Motor;Endurance;Sensory;Skin Integrity OT Basic ADL's Functional Problem(s): Bathing;Dressing;Toileting OT Transfers Functional Problem(s): Toilet OT Additional Impairment(s): None OT Plan OT Intensity: Minimum of 1-2 x/day, 45 to 90 minutes OT Frequency: 5 out of 7 days OT Duration/Estimated Length of Stay: 14-16 days OT Treatment/Interventions: Balance/vestibular training;Discharge planning;DME/adaptive equipment instruction;Functional mobility training;Pain management;Patient/family education;Self Care/advanced ADL retraining;Therapeutic  Activities;Therapeutic Exercise;UE/LE Strength taining/ROM OT Self Feeding Anticipated Outcome(s): I OT Basic Self-Care Anticipated Outcome(s): supervision OT Toileting Anticipated Outcome(s): min A OT Bathroom Transfers Anticipated Outcome(s): supervision to toilet OT Recommendation Patient destination: Home Follow Up Recommendations: Home health OT Equipment Recommended: To be determined Equipment Details: Pt has 2 BSCs, but may need a drop arm   Skilled Therapeutic Intervention Pt seen for initial evaluation and ADL training of BSC transfers, bathing.  Pt's sister presents, discussed with both the role of OT, purpose of evaluation.  Pt expressed great concern about her pain, her previous negative experiences with transfers, her fear of falling, etc.  A great deal of the session was paced around pt's comfort level and developing a sense of trust with the therapy team.  Pt was very apprehensive about taking the w/c into the bathroom again as she said the transfer was very difficult. She also has very limited R shoulder ROM and can not put wt through her L elbow.  A drop arm BSC brought in.  She stated she felt good about her transfers with the PT this am and I assured her we would use that same approach to the [facility]. Pt transferred to the L with only mod A but significant cues to not push through her L arm. From there she worked on bathing, no clothing available at this time.  Transferred to R to w/c with max A.  Reviewed expected outcomes, goals and pt in  agreement.  She stated she was comfortable at the end of the session and the transfer was not uncomfortable.  Pt in room with sister and all needs met.   OT Evaluation Precautions/Restrictions  Precautions Precautions: Fall;Other (comment) Precaution Comments: pt not to push or pull with Lt elbow more than 5 lbs (s/p total elbow replacement)  Restrictions RLE Weight Bearing: Non weight bearing  Pain Pain Assessment Pain Assessment:  0-10 Pain Score: 6  Pain Intervention(s): Medication (See eMAR) Home Living/Prior [place] expects to be discharged to:: Private residence Living Arrangements: Spouse/significant other, Children Available Help at Discharge: Family, Available 24 hours/day Type of Home: House Home Access: Ramped entrance Home Layout: One level Bathroom Shower/Tub: Walk-in shower, Other (comment), Door (has 2 BSC - one in shower and one over toilet) Bathroom Toilet: Standard Bathroom Accessibility: Yes  Lives With: Spouse Prior Function Level of Independence: Independent with basic ADLs, Independent with transfers, Independent with gait Vocation: Retired ADL ADL ADL Comments: refer to functional navigator Vision/Perception  Vision- History Baseline Vision/History: Wears glasses Wears Glasses: At all times Patient Visual Report: No change from baseline Vision- Assessment Vision Assessment?: No apparent visual deficits (macular degeneration, R eye blind) Perception Comments: WFL  Cognition Overall Cognitive Status: Within Functional Limits for tasks assessed Arousal/Alertness: Awake/alert Orientation Level: Person;Place;Situation Person: Oriented Place: Oriented Situation: Oriented Year: 2018 Month: February Day of Week: Correct Memory: Appears intact Immediate Memory Recall: Sock;Blue;Bed Memory Recall: Sock;Blue;Bed Memory Recall Sock: Without Cue Memory Recall Blue: Without Cue Memory Recall Bed: Without Cue Sensation Sensation Light Touch: Impaired by gross assessment Stereognosis: Appears Intact Hot/Cold: Appears Intact Proprioception: Appears Intact Additional Comments: pt reports LLE numbness, but it is very sensitive to touch Coordination Gross Motor Movements are Fluid and Coordinated: No Fine Motor Movements are Fluid and Coordinated: Yes Coordination and Movement Description: limited by pain Finger Nose Finger Test: not tested Motor   Motor Motor - Skilled Clinical Observations: generalized muscle weakness Mobility  Bed Mobility Bed Mobility: Supine to Sit Supine to Sit: HOB elevated;With rails;5: Supervision Supine to Sit Details: Verbal cues for technique;Verbal cues for sequencing Transfers Sit to Stand: 2: Max assist;With upper extremity assist Sit to Stand Details: Verbal cues for sequencing;Verbal cues for technique;Tactile cues for weight shifting;Tactile cues for posture;Manual facilitation for weight shifting Stand to Sit: 2: Max assist Stand to Sit Details (indicate cue type and reason): Verbal cues for technique;Tactile cues for weight shifting;Verbal cues for precautions/safety;Tactile cues for sequencing Stand to Sit Details: maxA for eccentric control  Trunk/Postural Assessment  Cervical Assessment Cervical Assessment: Within Functional Limits Thoracic Assessment Thoracic Assessment: Within Functional Limits Lumbar Assessment Lumbar Assessment: Exceptions to [facility] (posterior pelvic tilt) Postural Control Postural Control: Within Functional Limits (WFL in sitting)  Balance Balance Balance Assessed: Yes Static Sitting Balance Static Sitting - Balance Support: No upper extremity supported Static Sitting - Level of Assistance: 6: Modified independent (Device/Increase time) Dynamic Sitting Balance Dynamic Sitting - Balance Support: No upper extremity supported;Feet supported Dynamic Sitting - Level of Assistance: 5: Stand by assistance Dynamic Sitting - Balance Activities: Forward lean/weight shifting;Lateral lean/weight shifting;Reaching across midline Static Standing Balance Static Standing - Balance Support: Bilateral upper extremity supported Static Standing - Level of Assistance: 2: Max assist Static Standing - Comment/# of Minutes: unable to come to full stand; maxA to prevent posterior LOB Extremity/Trunk Assessment RUE Assessment RUE Assessment: Exceptions to [facility] RUE AROM (degrees) RUE  Overall AROM Comments: R shoulder AROM limited to 50- 60 degrees from fx  in 2014 LUE Assessment LUE Assessment: Exceptions to [facility] LUE AROM (degrees) LUE Overall AROM Comments: WFL, no weight bearing through elbow of more than 5 lbs from total elbow arthroplasty   See Function Navigator for Current Functional Status.   Refer to Care Plan for Long Term Goals  Recommendations for other services: None    Discharge Criteria: Patient will be discharged from OT if patient refuses treatment 3 consecutive times without medical reason, if treatment goals not met, if there is a change in medical status, if patient makes no progress towards goals or if patient is discharged from hospital.  The above assessment, treatment plan, treatment alternatives and goals were discussed and mutually agreed upon: by patient and by family  Dayle Mcnerney 12/31/2016, 1:32 PM

## 2017-01-01 ENCOUNTER — Inpatient Hospital Stay (HOSPITAL_COMMUNITY): Payer: Medicare Other | Admitting: Occupational Therapy

## 2017-01-01 ENCOUNTER — Inpatient Hospital Stay (HOSPITAL_COMMUNITY): Payer: Medicare Other | Admitting: Physical Therapy

## 2017-01-01 NOTE — Progress Notes (Signed)
Social Work  Social Work Assessment and Plan  Patient Details  Name: [name] MRN: [id number] Date of Birth: 1929/07/81  Today's Date: 01/01/2017  Problem List:  Patient Active Problem List   Diagnosis Date Noted  . Status post below knee amputation of right lower extremity ([place]) 12/30/2016  . Acute on chronic combined systolic and diastolic CHF (congestive heart failure) ([place]) 12/22/2016  . Anemia 12/22/2016  . Palliative care encounter   . Lower extremity pain, right   . Acute GI bleeding 10/23/2016  . Paroxysmal atrial fibrillation ([place]) 10/03/2016  . 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 PAD (peripheral artery disease) ([place]) 09/05/2016  . Groin pain 04/02/2015  . Aftercare following surgery of the circulatory system, [place] 12/13/2013  . Shingles 10/26/2013  . Anxiety 10/26/2013  . Acute blood loss anemia 09/05/2013  . Elbow fracture, left 09/05/2013  . Left acetabular fracture ([place]) 09/05/2013  . Ankle fracture, left 09/05/2013  . HTN (hypertension) 09/03/2013  . HLD (hyperlipidemia) 09/03/2013  . Peripheral vascular disease, unspecified 05/10/2012  . Chronic total occlusion of artery of the extremities ([place]) 01/19/2012   Past Medical History:  Past Medical History:  Diagnosis Date  . Anemia   . Anginal pain ([place])   . Arthritis    "qwhere" (09/05/2016)  . Atrial fibrillation ([place]) 09/2016  . Chronic lower back pain   . Complication of anesthesia    "takes a long time for it to wear off; I can hallucinate if I take too much" (09/05/2016)  . DVT (deep venous thrombosis) ([place]) 10/2009  . Fall from steps 08/31/2013   Fx. pelvis, Left Hip, Left Elbow  . Fibromyalgia   . GERD (gastroesophageal reflux disease)    09/22/16- "no too much anymore"  . GI bleed 10/24/2016  . High cholesterol   . History of blood transfusion   . History of hiatal hernia   . Hypertension   . Macular degeneration,  wet ([place])    "started in right eye; now legally blind in that eye; now started in left eye but pretty much in control" (09/05/2016)  . Osteoporosis   . Peripheral vascular disease ([place])    nonviable tissue Right foot  . PONV (postoperative nausea and vomiting)   . Squamous cell carcinoma of skin of right calf Aug. 2015  . Stroke ([place])    TIA's no residual  . TIA (transient ischemic attack)    "several at once; none in a long time" (09/05/2016)   Past Surgical History:  Past Surgical History:  Procedure Laterality Date  . ABDOMINAL AORTAGRAM N/A 12/26/2014   Procedure: ABDOMINAL Maxcine Ham;  Surgeon: Serafina Mitchell, MD;  Location: [facility] CATH LAB;  Service: Cardiovascular;  Laterality: N/A;  . AMPUTATION Right 12/23/2016   Procedure: AMPUTATION BELOW KNEE;  Surgeon: Serafina Mitchell, MD;  Location: [place];  Service: Vascular;  Laterality: Right;  . AORTOGRAM N/A 09/26/2016   Procedure: AORTOGRAM;  Surgeon: Waynetta Sandy, MD;  Location: [place];  Service: Vascular;  Laterality: N/A;  . CARPAL TUNNEL RELEASE Right   . CATARACT EXTRACTION W/ INTRAOCULAR LENS  IMPLANT, BILATERAL Bilateral   . COLONOSCOPY    . DILATION AND CURETTAGE OF UTERUS    . EYE SURGERY Right    "macular OR"  . FEMORAL ARTERY STENT  12-11-10   Left SFA  . FEMORAL-POPLITEAL BYPASS GRAFT Left 09/24/2016   Procedure: REDO FEMORAL TO POPLITEAL ARTERY BYPASS GRAFT USING 6MM  PROPATEN RINGED GORTEX GRAFT;  Surgeon: Serafina Mitchell, MD;  Location: [place];  Service: Vascular;  Laterality: Left;  . I&D EXTREMITY Right 12/17/2016   Procedure: IRRIGATION AND DEBRIDEMENT RIGHT FOOT;  Surgeon: Serafina Mitchell, MD;  Location: [place];  Service: Vascular;  Laterality: Right;  . INCISION AND DRAINAGE OF WOUND Left 10/25/2009   leg/notes 11/13/2009  . INSERTION OF ILIAC STENT Left 12/26/2014   Procedure: INSERTION OF ILIAC STENT;  Surgeon: Serafina Mitchell, MD;  Location: [facility] CATH LAB;  Service: Cardiovascular;  Laterality: Left;  .  INSERTION OF ILIAC STENT Left 09/26/2016   Procedure: SUB INTIMAL INSERTION OF SUPERFICIAL FEMORAL ARTERY AND BELOW KNEE BYPASS GRAFT;  Surgeon: Waynetta Sandy, MD;  Location: [place];  Service: Vascular;  Laterality: Left;  . JOINT REPLACEMENT     knee  . JOINT REPLACEMENT Left Oct. 17, 2014   Elbow ( pt fell 08-31-13 )  . ORIF SHOULDER FRACTURE Right    "it was crushed"  . PERIPHERAL VASCULAR CATHETERIZATION N/A 10/30/2015   Procedure: Abdominal Aortogram w/Lower Extremity;  Surgeon: Serafina Mitchell, MD;  Location: [place] CV LAB;  Service: Cardiovascular;  Laterality: N/A;  . PERIPHERAL VASCULAR CATHETERIZATION  10/30/2015   Procedure: Peripheral Vascular Intervention;  Surgeon: Serafina Mitchell, MD;  Location: [place] CV LAB;  Service: Cardiovascular;;  . PERIPHERAL VASCULAR CATHETERIZATION N/A 04/01/2016   Procedure: Abdominal Aortogram w/Lower Extremity;  Surgeon: Serafina Mitchell, MD;  Location: [place] CV LAB;  Service: Cardiovascular;  Laterality: N/A;  . PERIPHERAL VASCULAR CATHETERIZATION Left 04/01/2016   Procedure: Peripheral Vascular Atherectomy;  Surgeon: Serafina Mitchell, MD;  Location: [place] CV LAB;  Service: Cardiovascular;  Laterality: Left;  Superficial femoral artery.  Marland Kitchen PERIPHERAL VASCULAR CATHETERIZATION Right 09/05/2016   "stent"  . PERIPHERAL VASCULAR CATHETERIZATION N/A 09/05/2016   Procedure: Abdominal Aortogram w/Lower Extremity;  Surgeon: Serafina Mitchell, MD;  Location: [place] CV LAB;  Service: Cardiovascular;  Laterality: N/A;  . PERIPHERAL VASCULAR CATHETERIZATION Right 09/05/2016   Procedure: Peripheral Vascular Intervention;  Surgeon: Serafina Mitchell, MD;  Location: [place] CV LAB;  Service: Cardiovascular;  Laterality: Right;  Superficial Femoral  . PERIPHERAL VASCULAR CATHETERIZATION Left 09/09/2016   Procedure: Lower Extremity Angiography;  Surgeon: Serafina Mitchell, MD;  Location: [place] CV LAB;  Service: Cardiovascular;   Laterality: Left;  . PR VEIN BYPASS GRAFT,AORTO-FEM-POP  09-13-09   Left Fem-pop  . THROMBECTOMY FEMORAL ARTERY Right 09/26/2016   Procedure: Thromboembolectomy Right Lower Extremity, Right Femoral Artery Endarterectomy with Patch Angioplasty; Right Lower Extremity Angiogram ;  Surgeon: Waynetta Sandy, MD;  Location: [place];  Service: Vascular;  Laterality: Right;  . TOTAL ELBOW ARTHROPLASTY Left 09/03/2013   Procedure: LEFT TOTAL ELBOW ARTHROPLASTY;  Surgeon: Roseanne Kaufman, MD;  Location: [place];  Service: Orthopedics;  Laterality: Left;  . TOTAL KNEE ARTHROPLASTY Left 06/2006  . TRANSMETATARSAL AMPUTATION Right 12/11/2016   Procedure: TRANSMETATARSAL AMPUTATION;  Surgeon: Serafina Mitchell, MD;  Location: [place];  Service: Vascular;  Laterality: Right;  . TUBAL LIGATION    . VAGINAL HYSTERECTOMY     Social History:  reports that she quit smoking about 70 years ago. Her smoking use included Cigarettes. She has never used smokeless tobacco. She reports that she does not drink alcohol or use drugs.  Family / Support Systems Marital Status: Married How Long?: 72 yrs Patient Roles: Spouse, Parent, Other (Comment) (grandparent) Spouse/Significant Other: Eislee Ligman @ (H) [phone] Children: daughter,  Sherrye Colegrove @ (C) [phone]; son, Rush Landmark and son, Reita Cliche.  All children living locally and supportive. Anticipated Caregiver: Daughter and husband Ability/Limitations of Caregiver: Husband is retired and can provide supervision but not physical lifting.  Daughter works but can assist when not working. Caregiver Availability: 24/7 Family Dynamics: Pt describes very supportive family and reports they assist with meals, transportation and "really anything we need..."  Social History Preferred language: English Religion: Baptist Cultural Background: NA Read: Yes Employment Status: Retired Freight forwarder Issues: None Guardian/Conservator: None - per MD, pt is capable of making  decisions on her own behalf.   Abuse/Neglect Physical Abuse: Denies Verbal Abuse: Denies Sexual Abuse: Denies Exploitation of patient/patient's resources: Denies Self-Neglect: Denies  Emotional Status Pt's affect, behavior adn adjustment status: Pt very pleasant, talkative and humorous with sister and "like a sister"/ friend in the room.  She completes the assessment interview without difficulty.  She does become some tearful when she talks about the "excruciating pain" she has been experiencing for months and admits that she has had moments when she wanted to "stop it all" (medical treatments, surgical interventions.)  Offered support and encouraged her to not stifle her crying and normalized her emotional responses to her situation.  Will monitor and refer for neuropsychology as indicated.  She denies any s/s of depression at this time just simply states, "I'm just tired and 83 yrs old." Recent Psychosocial Issues: Multiple surgeries since last Nov. Pyschiatric History: None Substance Abuse History: None  Patient / Family Perceptions, Expectations & Goals Pt/Family understanding of illness & functional limitations: Pt and family with very good understanding of the medical course she has been through while trying to salvage her limb.  Good understanding of current functional limitations/ need for CIR. Premorbid pt/family roles/activities: Was independent overall and using w/c PTA Anticipated changes in roles/activities/participation: Little change anticipated if able to reach supervision goals. Pt/family expectations/goals: "I just want to go home and be able to do what I can for myself."  [employer]: None Premorbid Home Care/DME Agencies: Other (Comment) ([facility]) Transportation available at discharge: yes Resource referrals recommended: Support group (specify), Psychology  Discharge Planning Living Arrangements: Spouse/significant other Support  Systems: Spouse/significant other, Children, Other relatives, Friends/neighbors Type of Residence: Private residence Insurance Resources: [employer], Multimedia programmer (specify) Nurse, mental health) Financial Resources: [place] Referred: No Living Expenses: Own Money Management: Family Does the patient have any problems obtaining your medications?: No Home Management: pt and family Patient/Family Preliminary Plans: Pt to return home with her husband and family to provide any needed support/ assist. Social Work Anticipated Follow Up Needs: HH/OP Expected length of stay: 2 weeks  Clinical Impression Pleasant, elderly woman here following CIR.  Actually making good progress in therapies and has supervision goals overall.  She admits to having periods of some tearfulness which is directly caused by being tired and in pain.  Very supportive family with husband able to provide supervision and 3 local children all assisting as needed.  Will follow for support and d/c planning needs.  Merced Hanners 01/01/2017, 4:57 PM

## 2017-01-01 NOTE — Progress Notes (Signed)
[facility] PHYSICAL MEDICINE & REHABILITATION     PROGRESS NOTE    Subjective/Complaints: Up with PT this am. May have slept a little better.  ROS: pt denies nausea, vomiting, diarrhea, cough, shortness of breath or chest pain   Objective: Vital Signs: Blood pressure (!) 162/63, pulse 66, temperature 97.7 F (36.5 C), temperature source Oral, resp. rate 16, height 5\' 2"  (1.575 m), weight 54.3 kg (119 lb 11.4 oz), SpO2 98 %. No results found.  Recent Labs  12/30/16 2112 12/31/16 0534  WBC 10.1 8.7  HGB 7.9* 7.5*  HCT 25.0* 23.8*  PLT 570* 544*    Recent Labs  12/31/16 0534  NA 136  K 3.6  CL 100*  GLUCOSE 98  BUN 11  CREATININE 0.94  CALCIUM 9.0   CBG (last 3)  No results for input(s): GLUCAP in the last 72 hours.  Wt Readings from Last 3 Encounters:  01/01/17 54.3 kg (119 lb 11.4 oz)  12/30/16 48.2 kg (106 lb 3.2 oz)  12/01/16 58.2 kg (128 lb 6.4 oz)    Physical Exam:  HENT: oral mucosa pink Head: Normocephalic.  Eyes:PERRL.  Neck: Normal range of motion. Neck supple. No thyromegalypresent.  Cardiovascular: RRR Respiratory: CTA B GI: Soft. Bowel sounds are normal. She exhibits no distension. There is no tenderness.  Psychiatric: She has a normal mood and affect. Her behavior is normal.  Skin. BKA incision intact and well approximated. Minimal s/s drainage lateral incision Musculoskeletal. Arthritic changes in both hands. Right BK edematous and sensitive to touch. Able to fully extend the knee while supine.  Neurological: She is oriented to person, place, and time. No cranial nerve deficit.   Intrinsic minus left foot. Stocking glove sensory loss up to leftknee and to a lesser extent in the hands. UE remains 4+ to 5/5 with some weakness in both hands. LLE grossly 3-4/5 proximal to distal--stable Psych: flat  Assessment/Plan: 1. Functional deficits secondary to right BKA which require 3+ hours per day of interdisciplinary therapy in a comprehensive  inpatient rehab setting. Physiatrist is providing close team supervision and 24 hour management of active medical problems listed below. Physiatrist and rehab team continue to assess barriers to discharge/monitor patient progress toward functional and medical goals.  Function:  Bathing Bathing position   Position: Other (comment) (sitting on BSC)  Bathing parts Body parts bathed by patient: Right arm, Left arm, Chest, Abdomen, Front perineal area, Right upper leg, Left upper leg Body parts bathed by helper: Buttocks, Back, Left lower leg  Bathing assist        Upper Body Dressing/Undressing Upper body dressing   What is the patient wearing?: Hospital gown                Upper body assist        Lower Body Dressing/Undressing Lower body dressing   What is the patient wearing?: Hospital Gown, Non-skid slipper socks           Non-skid slipper socks- Performed by helper: Don/doff left sock                  Lower body assist        Toileting Toileting   Toileting steps completed by patient: Performs perineal hygiene Toileting steps completed by helper: Adjust clothing prior to toileting, Adjust clothing after toileting    Toileting assist     Transfers Chair/bed transfer   Chair/bed transfer method: Squat pivot Chair/bed transfer assist level: Moderate assist (Pt 50 - 74%/lift  or lower) Chair/bed transfer assistive device: Armrests     Locomotion Ambulation Ambulation activity did not occur: Safety/medical concerns         Wheelchair   Type: Manual Max wheelchair distance: 75 Assist Level: Touching or steadying assistance (Pt > 75%)  Cognition Comprehension Comprehension assist level: Follows complex conversation/direction with extra time/assistive device  Expression Expression assist level: Expresses complex ideas: With extra time/assistive device  Social Interaction Social Interaction assist level: Interacts appropriately with others - No  medications needed.  Problem Solving Problem solving assist level: Solves complex problems: With extra time  Memory Memory assist level: Complete Independence: No helper   Medical Problem List and Plan: 1. Decreased functional mobilitysecondary to right BKA 12/23/2016 -continue CIR therapies 2. DVT Prophylaxis/Anticoagulation: Sq lovenox 3. Pain Management/fibromyalgia: Neurontin 600 mg 3 times a day----has used this long term at home for neuropathic pain in legs---increased 2000 dose to 900mg --observe neuro status closely  -continue Duragesic patch 12.5 g, oxycodone as needed 4. Mood: Valium 2.5 mg daily at bedtime as needed anxiety 5. Neuropsych: This patient iscapable of making decisions on herown behalf. 6. Skin/Wound Care: wound needs to be dressed and wrapped with ACE 7. Fluids/Electrolytes/Nutrition: I personally reviewed the patient's labs today.  8.Acute blood loss anemia. Follow-up CBC with hgb down to 7.5---no signs of acute blood loss  -follow up labs tomorrow 9.Atrial fibrillation. Lopressor 50 mg daily and 25 mg daily at bedtime. Cardiac rate control. Follow-up per cardiology services 10.Hypertension. Avapro 75 mg daily, HCTZ 6.25 mg daily. Controlled at present 11.CRI stage III. Creatinine baseline 1.07-1.19. Cr 0.94   12.History of GI bleed. Continue Protonix 13.Constipation. Laxative assistance 14.Decreased nutritional storage. Dietary follow-up 15. Hx of right elbow replacement 2015---5 lb weight limit through elbow   LOS (Days) 2 A FACE TO FACE EVALUATION WAS PERFORMED  Meredith Staggers, MD 01/01/2017 9:04 AM

## 2017-01-01 NOTE — Plan of Care (Signed)
Problem: RH PAIN MANAGEMENT Goal: RH STG PAIN MANAGED AT OR BELOW PT'S PAIN GOAL Less than 3. Scale 0-10.  Outcome: Not Progressing Patient c/o pain even with pain med but able to tolerate

## 2017-01-01 NOTE — Progress Notes (Signed)
Physical Therapy Session Note  Patient Details  Name: [name] MRN: [id number] Date of Birth: 1928/04/27  Today's Date: 01/01/2017 PT Individual Time: [id number] and 1300-1415 PT Individual Time Calculation (min): 75 min and 75 min (total 150 min)   Short Term Goals: Week 1:  PT Short Term Goal 1 (Week 1): Pt will transfer w/c <>bed minA PT Short Term Goal 2 (Week 1): Pt will perform sit <>stand modA PT Short Term Goal 3 (Week 1): Pt will perform standing tolerance 2 min with minA PT Short Term Goal 4 (Week 1): Pt will initiate transfer training to/from car PT Short Term Goal 5 (Week 1): Pt will perform bed mobility with S  Skilled Therapeutic Interventions/Progress Updates: Tx 1: Pt received seated in w/c with handoff from OT; c/o pain as below and RN present during session to administer medication. Pt c/o increasing spasms in RLE this session compared to yesterday; cued pt in deep breathing when spasms occurred to help LE relax. Transfer w/c <>mat table with minA using arm rests and mod cues for technique and L foot position. Seated and supine LE strengthening exercises performed including long arc quad, glute sets, straight leg raise, bridging all 2x15 reps with min cues for recall to count reps/sets. Seated pipe tree activity for sitting balance/endurance while allowing for LE rest break. Transfer w/c <>recliner modA. Returned to room totalA; pt remained seated in w/c at end of session, all needs in reach and family present. Alerted to RN for pt request for pain medication again as soon as she is due; recommended nursing write time on white board that medication is due again to A pt with recall of medication schedule.   Tx 2: Pt received seated in w/c, c/o pain as below unchanged from morning session and agreeable to treatment. W/c propulsion with RUE, LLE x75' with minA and cues for technique. W/c propulsion around cones for focus on steering/turning with minA. Seated balance reaching  outside BOS while engaged in [employer], S for balance. Performed sit <>stand in parallel bars modA x5 reps total with long seated rest breaks between trials due to fatigue. Standing tolerance progressed 10 sec>30 sec with standbyA and BUE support. Remained seated in w/c at end of session, all needs in reach.      Therapy Documentation Precautions:  Precautions Precautions: Fall, Other (comment) Precaution Comments: pt not to push or pull with Lt elbow more than 5 lbs (s/p total elbow replacement)  Restrictions Weight Bearing Restrictions: Yes RLE Weight Bearing: Non weight bearing Pain: Pain Assessment Pain Assessment: 0-10 Pain Score: 5  Pain Type: Surgical pain Pain Location: Leg Pain Orientation: Right Pain Descriptors / Indicators: Aching;Discomfort Pain Onset: On-going Pain Intervention(s): RN made aware   See Function Navigator for Current Functional Status.   Therapy/Group: Individual Therapy  [name] 01/01/2017, 10:55 AM

## 2017-01-01 NOTE — Care Management Note (Signed)
[facility] Individual Statement of Services  Patient Name:  [name]  Date:  01/01/2017  Welcome to the [facility].  Our goal is to provide you with an individualized program based on your diagnosis and situation, designed to meet your specific needs.  With this comprehensive rehabilitation program, you will be expected to participate in at least 3 hours of rehabilitation therapies Monday-Friday, with modified therapy programming on the weekends.  Your rehabilitation program will include the following services:  Physical Therapy (PT), Occupational Therapy (OT), 24 hour per day rehabilitation nursing, Therapeutic Recreaction (TR), Neuropsychology, Case Management (Social Worker), Rehabilitation Medicine, Nutrition Services and Pharmacy Services  Weekly team conferences will be held on Tuesdays to discuss your progress.  Your Social Worker will talk with you frequently to get your input and to update you on team discussions.  Team conferences with you and your family in attendance may also be held.  Expected length of stay: 2 weeks  Overall anticipated outcome: supervision  Depending on your progress and recovery, your program may change. Your Social Worker will coordinate services and will keep you informed of any changes. Your Social Worker's name and contact numbers are listed  below.  The following services may also be recommended but are not provided by the [facility] will be made to provide these services after discharge if needed.  Arrangements include referral to agencies that provide these services.  Your insurance has been verified to be:  Medicare and [place] Your primary doctor is:  Dr. Inda Merlin  Pertinent information will be shared with your doctor and your insurance company.  Social Worker:  [place], [place] or ([phone]   Information discussed with and copy given to patient by: Lennart Pall, 01/01/2017, 4:32 PM

## 2017-01-01 NOTE — Progress Notes (Signed)
Occupational Therapy Session Note  Patient Details  Name: [name] MRN: [id number] Date of Birth: Jun 13, 1928  Today's Date: 01/01/2017 OT Individual Time: [id number] OT Individual Time Calculation (min): 60 min    Short Term Goals: Week 1:  OT Short Term Goal 1 (Week 1): Pt will be able to perform a squat pivot to the drop arm BSC to the L with min A and to the R with mod A. OT Short Term Goal 2 (Week 1): Pt will be able to bathe LB from bed with set up. OT Short Term Goal 3 (Week 1): Pt will be able to don pants in bed with supervision. OT Short Term Goal 4 (Week 1): Pt will be able to tolerate standing with mod - max A for at least 30 sec during toileting tasks.   Skilled Therapeutic Interventions/Progress Updates:    Pt received in w/c already dressed. Her daughter stated that she assisted her once she was on the [facility].  Asked them to wait for OT session tomorrow Am to work on those skills with OT. Pt agreeable to working on tub bench transfers in ADL apt. They have a large shower that was converted from a tub with a curtain.  They can use a tub bench with 2 legs outside of the tub.  Pt practiced squat pivots to tub bench with mod A to the L and max to the R.  Mod cues to only use L arm as a stabilizer, to reciprocal scoot hips forward and back to avoid pushing on L arm, and wt bear through L leg. Long discussion about shoe options as she does not like to wear shoes and the reasons she should wear one (poor sensation, skin protection).   Pt then taken to gym for squat pivots to the mat. On mat, stretching for R knee extension with R shoulder flexion as she self massaged leg. One sit to stand to RW with L hand on walker for support, with daughter holding walker, cues to bring L foot under her for BOS, cues to push up with L foot and R arm (on pushup block).  Max A to stand for 20 seconds.  Max pivot back to w/c. Pt taken back to room with family. Reported to RN about Leg pain.   Therapy  Documentation Precautions:  Precautions Precautions: Fall, Other (comment) Precaution Comments: pt not to push or pull with Lt elbow more than 5 lbs (s/p total elbow replacement)  Restrictions Weight Bearing Restrictions: Yes RLE Weight Bearing: Non weight bearing    Vital Signs: Therapy Vitals Temp: 97.7 F (36.5 C) Temp Source: Oral Pulse Rate: 66 Resp: 16 BP: (!) 162/63 Patient Position (if appropriate): Lying Oxygen Therapy SpO2: 98 % O2 Device: Not Delivered Pain:   ADL: ADL ADL Comments: refer to functional navigator  See Function Navigator for Current Functional Status.   Therapy/Group: Individual Therapy  SAGUIER,JULIA 01/01/2017, 8:28 AM

## 2017-01-02 ENCOUNTER — Inpatient Hospital Stay (HOSPITAL_COMMUNITY): Payer: Medicare Other | Admitting: Occupational Therapy

## 2017-01-02 ENCOUNTER — Inpatient Hospital Stay (HOSPITAL_COMMUNITY): Payer: Medicare Other | Admitting: Physical Therapy

## 2017-01-02 DIAGNOSIS — T8131XA Disruption of external operation (surgical) wound, not elsewhere classified, initial encounter: Secondary | ICD-10-CM

## 2017-01-02 LAB — BASIC METABOLIC PANEL
Anion gap: 9 (ref 5–15)
BUN: 15 mg/dL (ref 6–20)
CO2: 25 mmol/L (ref 22–32)
Calcium: 9.1 mg/dL (ref 8.9–10.3)
Chloride: 100 mmol/L — ABNORMAL LOW (ref 101–111)
Creatinine, Ser: 1.06 mg/dL — ABNORMAL HIGH (ref 0.44–1.00)
GFR, EST AFRICAN AMERICAN: 54 mL/min — AB (ref >60)
GFR, EST NON AFRICAN AMERICAN: 46 mL/min — AB (ref >60)
Glucose, Bld: 105 mg/dL — ABNORMAL HIGH (ref 65–99)
POTASSIUM: 3.6 mmol/L (ref 3.5–5.1)
SODIUM: 134 mmol/L — AB (ref 135–145)

## 2017-01-02 NOTE — Progress Notes (Signed)
Occupational Therapy Session Note  Patient Details  Name: [name] MRN: [id number] Date of Birth: 08/22/1928  Today's Date: 01/02/2017 OT Individual Time: 0830-1000 OT Individual Time Calculation (min): 90 min    Short Term Goals: Week 1:  OT Short Term Goal 1 (Week 1): Pt will be able to perform a squat pivot to the drop arm BSC to the L with min A and to the R with mod A. OT Short Term Goal 2 (Week 1): Pt will be able to bathe LB from bed with set up. OT Short Term Goal 3 (Week 1): Pt will be able to don pants in bed with supervision. OT Short Term Goal 4 (Week 1): Pt will be able to tolerate standing with mod - max A for at least 30 sec during toileting tasks.   Skilled Therapeutic Interventions/Progress Updates:    Pt seen this session for ADL training with a focus on transfers, sit to stand, activity tolerance, and adaptive strategies. Pt agreeable to a shower. R limb wrapped. Pt w/c placed adjacent to tub bench for squat pivot set up. Mod cues to not use L arm to push with. Mod A to complete transfer. Once on bench, pt demonstrated good sitting balance and was able to bathe every thing except L foot. Pt will benefit from a long sponge.  To transfer out, front of chair cushion placed next to end of bench. Because there is no R lower limb, pt could rotate body to R and then slide back into chair in a A/P transfer and that worked well with only min A.   Completed UB dressing with min A due to limited shoulder AROM and set up grooming.  She donned pants over legs with cues and guiding A, stood to RW with mod A while RN adjusted pants over pt's hips.  Pt took a rest break of a few minutes.  UE AROM with 1# dowel bar and AROM of L hip flexion and L knee extension to strengthen extremities needed for transfers.  Pt resting in w/c with all needs met.  Therapy Documentation Precautions:  Precautions Precautions: Fall, Other (comment) Precaution Comments: pt not to push or pull with Lt  elbow more than 5 lbs (s/p total elbow replacement)  Restrictions Weight Bearing Restrictions: Yes RLE Weight Bearing: Non weight bearing  Pain: Pain Assessment Pain Assessment: 0-10 Pain Score: 5  Pain Type: Surgical pain;Acute pain Pain Location: Leg Pain Orientation: Right Pain Descriptors / Indicators: Aching Pain Onset: On-going Pain Intervention(s): Rest ADL: ADL ADL Comments: refer to functional navigator  See Function Navigator for Current Functional Status.   Therapy/Group: Individual Therapy  SAGUIER,JULIA 01/02/2017, 9:29 AM 

## 2017-01-02 NOTE — IPOC Note (Signed)
Overall Plan of Care [facility]) Patient Details Name: [name] MRN: [id number] DOB: 06-19-1928  Admitting Diagnosis: R BKA  Hospital Problems: Principal Problem:   Status post below knee amputation of right lower extremity (HCC) Active Problems:   HTN (hypertension)   Paroxysmal atrial fibrillation (HCC)   Acute on chronic combined systolic and diastolic CHF (congestive heart failure) (HCC)     Functional Problem List: Nursing Motor, Nutrition, Skin Integrity, Safety, Pain, Perception  PT Balance, Edema, Endurance, Motor, Nutrition, Pain, Safety, Sensory, Skin Integrity  OT Balance, Pain, Motor, Endurance, Sensory, Skin Integrity  SLP    TR         Basic ADL's: OT Bathing, Dressing, Toileting     Advanced  ADL's: OT       Transfers: PT Bed Mobility, Bed to Chair, Car  OT Toilet     Locomotion: PT Wheelchair Mobility     Additional Impairments: OT None  SLP        TR      Anticipated Outcomes Item Anticipated Outcome  Self Feeding I  Swallowing      Basic self-care  supervision  Toileting  min A   Bathroom Transfers supervision to toilet  Bowel/Bladder  Cont. Of B/B.  Transfers  S squat pivot transfer  Locomotion  S w/c mobility, minA sit <>stand with RW  Communication     Cognition     Pain  Pain to be less thn 3.  Safety/Judgment  High fall risk.   Therapy Plan: PT Intensity: Minimum of 1-2 x/day ,45 to 90 minutes PT Frequency: 5 out of 7 days PT Duration Estimated Length of Stay: 2 weeks OT Intensity: Minimum of 1-2 x/day, 45 to 90 minutes OT Frequency: 5 out of 7 days OT Duration/Estimated Length of Stay: 14-16 days         Team Interventions: Nursing Interventions Patient/Family Education, Medication Management, Discharge Planning, Skin Care/Wound Management, Pain Management  PT interventions Discharge planning, Balance/vestibular training, Ambulation/gait training, Functional mobility training, Therapeutic Activities,  Therapeutic Exercise, UE/LE Strength taining/ROM, UE/LE Coordination activities, Pain management, Patient/family education, Functional electrical stimulation, Community reintegration, Engineer, drilling, Disease management/prevention, Neuromuscular re-education, Wheelchair propulsion/positioning, Psychosocial support, Skin care/wound management  OT Interventions Training and development officer, Discharge planning, DME/adaptive equipment instruction, Functional mobility training, Pain management, Patient/family education, Self Care/advanced ADL retraining, Therapeutic Activities, Therapeutic Exercise, UE/LE Strength taining/ROM  SLP Interventions    TR Interventions    SW/CM Interventions Discharge Planning, Psychosocial Support, Patient/Family Education    Team Discharge Planning: Destination: PT-Home ,OT- Home , SLP-  Projected Follow-up: PT-Home health PT, OT-  Home health OT, SLP-  Projected Equipment Needs: PT-To be determined, OT- To be determined, SLP-  Equipment Details: PT-has RW, rollator, w/c, OT-Pt has 2 BSCs, but may need a drop arm Patient/family involved in discharge planning: PT- Patient, Family Midwife,  OT-Patient, Family member/caregiver, SLP-   MD ELOS: 14 days Medical Rehab Prognosis:  Excellent Assessment: The patient has been admitted for CIR therapies with the diagnosis of right BKA. The team will be addressing functional mobility, strength, stamina, balance, safety, adaptive techniques and equipment, self-care, bowel and bladder mgt, patient and caregiver education, wound care, pre-prosthetic education, ROM, orthotics. Goals have been set at supervision to min assist for mobility and self-care tasks.    Meredith Staggers, MD, FAAPMR      See Team Conference Notes for weekly updates to the plan of care

## 2017-01-02 NOTE — Progress Notes (Signed)
[place] PHYSICAL MEDICINE & REHABILITATION     PROGRESS NOTE    Subjective/Complaints: Getting in shower with OT. Leg wrapped in plastic for shower. Pt concerned about some drainage from leg  ROS: pt denies nausea, vomiting, diarrhea, cough, shortness of breath or chest pain    Objective: Vital Signs: Blood pressure (!) 139/44, pulse 65, temperature 98.1 F (36.7 C), temperature source Oral, resp. rate 19, height 5\' 2"  (1.575 m), weight 54.3 kg (119 lb 11.4 oz), SpO2 95 %. No results found.  Recent Labs  12/30/16 2112 12/31/16 0534  WBC 10.1 8.7  HGB 7.9* 7.5*  HCT 25.0* 23.8*  PLT 570* 544*    Recent Labs  12/31/16 0534 01/02/17 0411  NA 136 134*  K 3.6 3.6  CL 100* 100*  GLUCOSE 98 105*  BUN 11 15  CREATININE 0.94 1.06*  CALCIUM 9.0 9.1   CBG (last 3)  No results for input(s): GLUCAP in the last 72 hours.  Wt Readings from Last 3 Encounters:  01/01/17 54.3 kg (119 lb 11.4 oz)  12/30/16 48.2 kg (106 lb 3.2 oz)  12/01/16 58.2 kg (128 lb 6.4 oz)    Physical Exam:  HENT: oral mucosa pink Head: Normocephalic.  Eyes: EOMI.  Neck: Normal range of motion. Neck supple. No thyromegalypresent.  Cardiovascular: RRR Respiratory: CTA B GI: Soft. Bowel sounds are normal. She exhibits no distension. There is no tenderness.  Psychiatric: She has a normal mood and affect. Her behavior is normal.  Skin. BKA incision intact and well approximated. Minimal s/s drainage lateral incision     Musculoskeletal. Arthritic changes in both hands. Right BK edematous and sensitive to touch. Able to fully extend the knee while supine.  Neurological: She is oriented to person, place, and time. No cranial nerve deficit.   Intrinsic minus left foot. Stocking glove sensory loss up to leftknee and to a lesser extent in the hands. UE remains 4+ to 5/5 with some weakness in both hands. LLE grossly 3-4/5 proximal to distal--no change Psych: flat  Assessment/Plan: 1. Functional  deficits secondary to right BKA which require 3+ hours per day of interdisciplinary therapy in a comprehensive inpatient rehab setting. Physiatrist is providing close team supervision and 24 hour management of active medical problems listed below. Physiatrist and rehab team continue to assess barriers to discharge/monitor patient progress toward functional and medical goals.  Function:  Bathing Bathing position   Position: Other (comment) (sitting on BSC)  Bathing parts Body parts bathed by patient: Right arm, Left arm, Chest, Abdomen, Front perineal area, Right upper leg, Left upper leg Body parts bathed by helper: Buttocks, Back, Left lower leg  Bathing assist        Upper Body Dressing/Undressing Upper body dressing   What is the patient wearing?: Hospital gown                Upper body assist        Lower Body Dressing/Undressing Lower body dressing   What is the patient wearing?: Hospital Gown, Non-skid slipper socks           Non-skid slipper socks- Performed by helper: Don/doff left sock                  Lower body assist        Toileting Toileting   Toileting steps completed by patient: Performs perineal hygiene Toileting steps completed by helper: Adjust clothing prior to toileting, Adjust clothing after toileting    Toileting assist  Transfers Chair/bed transfer   Chair/bed transfer method: Squat pivot Chair/bed transfer assist level: Moderate assist (Pt 50 - 74%/lift or lower) Chair/bed transfer assistive device: Armrests     Locomotion Ambulation Ambulation activity did not occur: Safety/medical concerns         Wheelchair   Type: Manual Max wheelchair distance: 75 Assist Level: Touching or steadying assistance (Pt > 75%)  Cognition Comprehension Comprehension assist level: Follows complex conversation/direction with extra time/assistive device  Expression Expression assist level: Expresses complex ideas: With extra  time/assistive device  Social Interaction Social Interaction assist level: Interacts appropriately with others - No medications needed.  Problem Solving Problem solving assist level: Solves complex problems: With extra time  Memory Memory assist level: Complete Independence: No helper   Medical Problem List and Plan: 1. Decreased functional mobilitysecondary to right BKA 12/23/2016 -continue CIR therapies 2. DVT Prophylaxis/Anticoagulation: Sq lovenox 3. Pain Management/fibromyalgia: Neurontin 600 mg 3 times a day----has used this long term at home for neuropathic pain in legs---increased 2000 dose to 900mg --neuro status stable  -continue Duragesic patch 12.5 g, oxycodone as needed 4. Mood: Valium 2.5 mg daily at bedtime as needed anxiety 5. Neuropsych: This patient iscapable of making decisions on herown behalf. 6. Skin/Wound Care: wound with early signs if dehiscence and incisional necrosis  -changed to xeroform,gauze,kerlix dressing daily  -follow serially 7. Fluids/Electrolytes/Nutrition: I personally reviewed the patient's labs today.  8.Acute blood loss anemia. Follow-up CBC with hgb down to 7.5---no signs of acute blood loss  -follow up labs tomorrow 9.Atrial fibrillation. Lopressor 50 mg daily and 25 mg daily at bedtime. Cardiac rate control. Follow-up per cardiology services 10.Hypertension. Avapro 75 mg daily, HCTZ 6.25 mg daily. Controlled at present 11.CRI stage III. Creatinine baseline 1.07-1.19. Cr 0.94   12.History of GI bleed. Continue Protonix 13.Constipation. Laxative assistance 14.Decreased nutritional storage. Dietary follow-up 15. Hx of right elbow replacement 2015---5 lb weight limit through elbow   LOS (Days) 3 A FACE TO FACE EVALUATION WAS PERFORMED  Meredith Staggers, MD 01/02/2017 9:13 AM

## 2017-01-02 NOTE — Progress Notes (Signed)
Physical Therapy Session Note  Patient Details  Name: [name] MRN: [id number] Date of Birth: 11-23-1927  Today's Date: 01/02/2017 PT Individual Time: 1100-1200 and 1400-1500 PT Individual Time Calculation (min): 60 min and 60 min (total 120 min)   Short Term Goals: Week 1:  PT Short Term Goal 1 (Week 1): Pt will transfer w/c <>bed minA PT Short Term Goal 2 (Week 1): Pt will perform sit <>stand modA PT Short Term Goal 3 (Week 1): Pt will perform standing tolerance 2 min with minA PT Short Term Goal 4 (Week 1): Pt will initiate transfer training to/from car PT Short Term Goal 5 (Week 1): Pt will perform bed mobility with S  Skilled Therapeutic Interventions/Progress Updates: Tx 1: Pt received seated in w/c, c/o pain as below and agreeable to treatment. Pt performed 2x15 reps long arc quads, hip flexion marching while therapist obtained new w/c with improved amputee pad support. Transfer w/c >mat table, mat table >w/c with minA to level surface. Transfer x2 w/c <>sofa to simulate home environment with modA. First trial therapist setup w/c; second trial cued pt to setup chair including management of amputee pad and arm rests; mod cueing required and modA for transfer. Discussed weight bearing restrictions with LUE and limitations for gait, transfers. Will continue to discuss with family, pt and therapy staff to determine best/safest transfer method. Remained seated in w/c at end of session, family present and all needs in reach.   Tx 2: Pt received seated in w/c; c/o pain 5/10 and RN alerted. Transfer w/c >mat table with modA squat pivot. Sit <>supine with S. Supine LE strengthening exercises 2x15 straight leg raise and glute sets while therapist obtained platform RW. Sit <>stand x3 trials with maxA overall. Transfer w/c <>mat table x4 trials with transfer board and minA; LUE placed on therapist's shoulder to reduce reliance during transfers d/t WB precautions. Pt reports she'd rather perform  squat pivots, however educated pt and daughter in importance of adhering to [facility] precautions, as well as reduced assist required from therapist compared to squat pivots. Pt remained seated in w/c at end of session, all needs in reach. Safety plan updated to reflect change in recommendation to use transfer board.      Therapy Documentation Precautions:  Precautions Precautions: Fall, Other (comment) Precaution Comments: pt not to push or pull with Lt elbow more than 5 lbs (s/p total elbow replacement)  Restrictions Weight Bearing Restrictions: Yes RLE Weight Bearing: Non weight bearing Pain: Pain Assessment Pain Assessment: 0-10 Pain Score: 5  Pain Type: Surgical pain;Acute pain Pain Location: Leg Pain Orientation: Right Pain Descriptors / Indicators: Aching Pain Intervention(s): Rest   See Function Navigator for Current Functional Status.   Therapy/Group: Individual Therapy  [name] 01/02/2017, 12:10 PM

## 2017-01-03 ENCOUNTER — Inpatient Hospital Stay (HOSPITAL_COMMUNITY): Payer: Medicare Other | Admitting: Occupational Therapy

## 2017-01-03 ENCOUNTER — Inpatient Hospital Stay (HOSPITAL_COMMUNITY): Payer: Medicare Other | Admitting: *Deleted

## 2017-01-03 DIAGNOSIS — G8918 Other acute postprocedural pain: Secondary | ICD-10-CM

## 2017-01-03 DIAGNOSIS — G546 Phantom limb syndrome with pain: Secondary | ICD-10-CM

## 2017-01-03 MED ORDER — METHOCARBAMOL 500 MG PO TABS
500.0000 mg | ORAL_TABLET | Freq: Four times a day (QID) | ORAL | Status: DC | PRN
  Administered 2017-01-03 – 2017-01-13 (×27): 500 mg via ORAL
  Filled 2017-01-03 (×27): qty 1

## 2017-01-03 MED ORDER — HYDROMORPHONE HCL 2 MG PO TABS
2.0000 mg | ORAL_TABLET | Freq: Three times a day (TID) | ORAL | Status: DC | PRN
  Administered 2017-01-07: 2 mg via ORAL
  Filled 2017-01-03: qty 1

## 2017-01-03 NOTE — Progress Notes (Addendum)
[place] PHYSICAL MEDICINE & REHABILITATION     PROGRESS NOTE    Subjective/Complaints: Still having substantial stump and phantom limb pain. Slept a little with valium last night. Pain is most severe at night  ROS: pt denies nausea, vomiting, diarrhea, cough, shortness of breath or chest pain    Objective: Vital Signs: Blood pressure (!) 150/45, pulse 75, temperature 98.6 F (37 C), temperature source Oral, resp. rate 16, height 5\' 2"  (1.575 m), weight 54.1 kg (119 lb 4.3 oz), SpO2 97 %. No results found. No results for input(s): WBC, HGB, HCT, PLT in the last 72 hours.  Recent Labs  01/02/17 0411  NA 134*  K 3.6  CL 100*  GLUCOSE 105*  BUN 15  CREATININE 1.06*  CALCIUM 9.1   CBG (last 3)  No results for input(s): GLUCAP in the last 72 hours.  Wt Readings from Last 3 Encounters:  01/03/17 54.1 kg (119 lb 4.3 oz)  12/30/16 48.2 kg (106 lb 3.2 oz)  12/01/16 58.2 kg (128 lb 6.4 oz)    Physical Exam:  HENT: oral mucosa pink Head: Normocephalic.  Eyes: EOMI.  Neck: Normal range of motion. Neck supple. No thyromegalypresent.  Cardiovascular: RRR Respiratory:CTA B GI: Soft. Bowel sounds are normal. She exhibits no distension. There is no tenderness.  Psychiatric: She has a normal mood and affect. Her behavior is normal.  Skin. BKA incision intact and well approximated. Minimal s/s drainage lateral incision 01/02/17 photo     Musculoskeletal. Arthritic changes in both hands. Right BK edematous and sensitive to touch. Able to fully extend the knee while supine.  Neurological: She remains oriented to person, place, and time. No cranial nerve deficit.   Intrinsic minus left foot. Stocking glove sensory loss up to leftknee and to a lesser extent in the hands. UE remains 4+ to 5/5 with some weakness in both hands. LLE grossly 3-4/5 proximal to distal--no change Psych: flat  Assessment/Plan: 1. Functional deficits secondary to right BKA which require 3+ hours per  day of interdisciplinary therapy in a comprehensive inpatient rehab setting. Physiatrist is providing close team supervision and 24 hour management of active medical problems listed below. Physiatrist and rehab team continue to assess barriers to discharge/monitor patient progress toward functional and medical goals.  Function:  Bathing Bathing position   Position: Shower  Bathing parts Body parts bathed by patient: Right arm, Left arm, Chest, Abdomen, Front perineal area, Right upper leg, Left upper leg, Buttocks, Back Body parts bathed by helper: Left lower leg  Bathing assist        Upper Body Dressing/Undressing Upper body dressing   What is the patient wearing?: Pull over shirt/dress     Pull over shirt/dress - Perfomed by patient: Thread/unthread left sleeve, Pull shirt over trunk Pull over shirt/dress - Perfomed by helper: Put head through opening        Upper body assist        Lower Body Dressing/Undressing Lower body dressing   What is the patient wearing?: Pants, Shoes     Pants- Performed by patient: Thread/unthread right pants leg, Thread/unthread left pants leg Pants- Performed by helper: Pull pants up/down   Non-skid slipper socks- Performed by helper: Don/doff left sock     Shoes - Performed by patient: Don/doff left shoe            Lower body assist        Toileting Toileting   Toileting steps completed by patient: Performs perineal hygiene Toileting steps  completed by helper: Adjust clothing prior to toileting, Adjust clothing after toileting    Toileting assist     Transfers Chair/bed transfer   Chair/bed transfer method: Lateral scoot Chair/bed transfer assist level: Touching or steadying assistance (Pt > 75%) Chair/bed transfer assistive device: Sliding board, Armrests     Locomotion Ambulation Ambulation activity did not occur: Safety/medical concerns         Wheelchair   Type: Manual Max wheelchair distance: 75 Assist Level:  Touching or steadying assistance (Pt > 75%)  Cognition Comprehension Comprehension assist level: Follows complex conversation/direction with extra time/assistive device  Expression Expression assist level: Expresses complex ideas: With extra time/assistive device  Social Interaction Social Interaction assist level: Interacts appropriately with others - No medications needed.  Problem Solving Problem solving assist level: Solves complex problems: With extra time  Memory Memory assist level: Complete Independence: No helper   Medical Problem List and Plan: 1. Decreased functional mobilitysecondary to right BKA 12/23/2016 -continue CIR therapies 2. DVT Prophylaxis/Anticoagulation: Sq lovenox 3. Pain Management/fibromyalgia: Neurontin 600 mg 3 times a day----has used this long term at home for neuropathic pain in legs---decreased on 2/15 to only 300mg  qhs  -continue Duragesic patch 12.5 g, oxycodone as needed  -added robaxin for spasm  -can use low dose oral dilaudid for severe pain  -has high tolerance of pain medications/spoke with pt/daughter at length 4. Mood: Valium 2.5 mg daily at bedtime as needed anxiety 5. Neuropsych: This patient iscapable of making decisions on herown behalf. 6. Skin/Wound Care: wound with early signs if dehiscence and incisional necrosis  -  xeroform,gauze,kerlix dressing daily  -continue current dressing 7. Fluids/Electrolytes/Nutrition: I personally reviewed the patient's labs today.  8.Acute blood loss anemia. Follow-up CBC with hgb down to 7.5---no signs of acute blood loss  -follow up labs on Monday 9.Atrial fibrillation. Lopressor 50 mg daily and 25 mg daily at bedtime. Cardiac rate control. Follow-up per cardiology services 10.Hypertension. Avapro 75 mg daily, HCTZ 6.25 mg daily. Controlled at present 11.CRI stage III. Creatinine baseline 1.07-1.19. Cr 0.94   12.History of GI bleed. Continue Protonix 13.Constipation. Laxative  assistance 14.Decreased nutritional storage. Dietary follow-up 15. Hx of right elbow replacement 2015---5 lb weight limit through elbow   LOS (Days) 4 A FACE TO FACE EVALUATION WAS PERFORMED  Meredith Staggers, MD 01/03/2017 8:20 AM

## 2017-01-03 NOTE — Progress Notes (Signed)
Physical Therapy Session Note  Patient Details  Name: [name] MRN: [id number] Date of Birth: 08/24/1928  Today's Date: 01/03/2017 PT Individual Time: 0910-1010 PT Individual Time Calculation (min): 60 min   Short Term Goals: Week 1:  PT Short Term Goal 1 (Week 1): Pt will transfer w/c <>bed minA PT Short Term Goal 2 (Week 1): Pt will perform sit <>stand modA PT Short Term Goal 3 (Week 1): Pt will perform standing tolerance 2 min with minA PT Short Term Goal 4 (Week 1): Pt will initiate transfer training to/from car PT Short Term Goal 5 (Week 1): Pt will perform bed mobility with S  Skilled Therapeutic Interventions/Progress Updates:    Pt up in [facility] with daughter present. Pt in 7/10 phantom limb pain. RN premedicate. Pt educated on mechanism of phantom limb pain and options for tx. Tx focused today on NMR mirror therapy in day room with relaxation music as well as transfers and therex.   Lateral scoot transfer with mod A WC><standard chair for optimal positioning. Standing mirror placed between legs with blanket over residual limb, pt scooted to edge of chair for optimal visual effect. Pt led through deep breathing exercises 3x5 throughout tx during rest breaks from the following NMR for RLE and therex with LLE while pt gazed into mirror: wiggle toes with shoe removed, heel/toe raises, rubbing/tapping/massaging LLE, marching a fwe times to tolerance, hip ADD squeeze with soft ball, ball roll under L foot. Pt report >2 point reduction in pain by end of tx.   Staff pushed WC for time management. Reinforced importance of eating and fluid intake as well as urinating regularly throughout the day to avoid infection and promote optimal healing.   Pt left up in room with family and all needs in reach.   Therapy Documentation Precautions:  Precautions Precautions: Fall, Other (comment) Precaution Comments: pt not to push or pull with Lt elbow more than 5 lbs (s/p total elbow replacement)   Restrictions Weight Bearing Restrictions: Yes RLE Weight Bearing: Non weight bearing General:   Vital Signs: Therapy Vitals Temp: 98.6 F (37 C) Temp Source: Oral Pulse Rate: 75 Resp: 16 BP: (!) 150/45 Patient Position (if appropriate): Lying Oxygen Therapy SpO2: 97 % O2 Device: Not Delivered   See Function Navigator for Current Functional Status.   Therapy/Group: Individual Therapy  Abram Sax, Corinna Lines, PT, DPT  01/03/2017, 8:27 AM

## 2017-01-03 NOTE — Progress Notes (Signed)
Occupational Therapy Session Note  Patient Details  Name: [name] MRN: [id number] Date of Birth: 1928/06/07  Today's Date: 01/03/2017 OT Individual Time: 1055-1200 and 1500-1615 OT Individual Time Calculation (min): 65 min  And 75 min   Short Term Goals: Week 1:  OT Short Term Goal 1 (Week 1): Pt will be able to perform a squat pivot to the drop arm BSC to the L with min A and to the R with mod A. OT Short Term Goal 2 (Week 1): Pt will be able to bathe LB from bed with set up. OT Short Term Goal 3 (Week 1): Pt will be able to don pants in bed with supervision. OT Short Term Goal 4 (Week 1): Pt will be able to tolerate standing with mod - max A for at least 30 sec during toileting tasks.   Skilled Therapeutic Interventions/Progress Updates:    Visit 1:  Pain: 9/10 in R limb, RN provided  Medication. Pt hesitant to do therapy due to pain and discomfort, but with encouragement agreed to work.  Pt taken to gym and used slide board to mat with min A.  On mat work on postural strength in sitting, gentle shoulder AROM with holding ball and moving side to side, light strengthening with 1.5 pound dumbell, supine knee sways for core strength, legs on small physioball for knee rotations side to side. Pt only needed a few small rest breaks and participated well. Used board back to w/c min A. Pt returned to room with family.   Visit 2:  Pain: 5/10 R LE. Pt received in bed and preferred to complete therapy in the room.  Pt worked on L hip bridges, AROM of R knee extension with leg propped on a pillow, AROM of BUE. Spent time discussing adjustment issues/ strategies in relation to pain, limb loss, coping at home.  Pt needed to toilet. She requested to do a squat pivot bed to chair. W/c taken into bathroom with BSC over the toilet. Completed squat pivot with mod A and cues to not push through her L arm.  Needed A with clothing management. Pt opted to undress and put on a gown. She decided to rest in  w/c to prepare for dinner.   Therapy Documentation Precautions:  Precautions Precautions: Fall, Other (comment) Precaution Comments: pt not to push or pull with Lt elbow more than 5 lbs (s/p total elbow replacement)  Restrictions Weight Bearing Restrictions: Yes RLE Weight Bearing: Non weight bearing  ADL: ADL ADL Comments: refer to functional navigator  See Function Navigator for Current Functional Status.   Therapy/Group: Individual Therapy  [place] 01/03/2017, 1:33 PM

## 2017-01-03 NOTE — Progress Notes (Signed)
Patient pain managed by pain regimen, patient noted pain meds rotation was more effective. Staff will continue to monitor.

## 2017-01-03 NOTE — Plan of Care (Signed)
Problem: RH SKIN INTEGRITY Goal: RH STG SKIN FREE OF INFECTION/BREAKDOWN Mod. Assist.  Outcome: Not Progressing Stump incision  Problem: RH PAIN MANAGEMENT Goal: RH STG PAIN MANAGED AT OR BELOW PT'S PAIN GOAL Less than 3. Scale 0-10.  Outcome: Progressing Pt slept throughout most of the night and woke up once; daughter requested pain medication for her at this time

## 2017-01-04 NOTE — Progress Notes (Signed)
[facility]IC[facility] & REHABILITATION     PROGRESS NOTE    Subjective/Complaints: Had a much better night. Able to sleep. Pain better controlled.   ROS: pt denies nausea, vomiting, diarrhea, cough, shortness of breath or chest pain     Objective: Vital Signs: Blood pressure (!) 169/55, pulse 74, temperature 97.8 F (36.6 C), temperature source Oral, resp. rate 16, height 5\' 2"  (1.575 m), weight 50.4 kg (111 lb 3.2 oz), SpO2 95 %. No results found. No results for input(s): WBC, HGB, HCT, PLT in the last 72 hours.  Recent Labs  01/02/17 0411  NA 134*  K 3.6  CL 100*  GLUCOSE 105*  BUN 15  CREATININE 1.06*  CALCIUM 9.1   CBG (last 3)  No results for input(s): GLUCAP in the last 72 hours.  Wt Readings from Last 3 Encounters:  01/04/17 50.4 kg (111 lb 3.2 oz)  12/30/16 48.2 kg (106 lb 3.2 oz)  12/01/16 58.2 kg (128 lb 6.4 oz)    Physical Exam:  HENT: oral mucosa pink Head: Normocephalic.  Eyes: EOMI.  Neck: Normal range of motion. Neck supple. No thyromegalypresent.  Cardiovascular: RRR Respiratory:CTA B GI: Soft. Bowel sounds are normal. She exhibits no distension. There is no tenderness.  Psychiatric: She has a normal mood and affect. Her behavior is normal.  Skin. BKA incision intact and well approximated. Minimal s/s drainage lateral incision 01/04/17 photo:     Musculoskeletal. Arthritic changes in both hands. Right BK edematous and sensitive to touch. Able to fully extend the knee while supine.  Neurological: She remains oriented to person, place, and time. No cranial nerve deficit.   Intrinsic minus left foot. Stocking glove sensory loss up to leftknee and to a lesser extent in the hands. UE remains 4+ to 5/5 with some weakness in both hands. LLE grossly 3-4/5 proximal to distal--no change Psych: flat  Assessment/Plan: 1. Functional deficits secondary to right BKA which require 3+ hours per day of interdisciplinary therapy in a comprehensive  inpatient rehab setting. Physiatrist is providing close team supervision and 24 hour management of active medical problems listed below. Physiatrist and rehab team continue to assess barriers to discharge/monitor patient progress toward functional and medical goals.  Function:  Bathing Bathing position   Position: Shower  Bathing parts Body parts bathed by patient: Right arm, Left arm, Chest, Abdomen, Front perineal area, Right upper leg, Left upper leg, Buttocks, Back Body parts bathed by helper: Left lower leg  Bathing assist        Upper Body Dressing/Undressing Upper body dressing   What is the patient wearing?: Pull over shirt/dress     Pull over shirt/dress - Perfomed by patient: Thread/unthread left sleeve, Pull shirt over trunk Pull over shirt/dress - Perfomed by helper: Put head through opening        Upper body assist        Lower Body Dressing/Undressing Lower body dressing   What is the patient wearing?: Pants, Shoes     Pants- Performed by patient: Thread/unthread right pants leg, Thread/unthread left pants leg Pants- Performed by helper: Pull pants up/down   Non-skid slipper socks- Performed by helper: Don/doff left sock     Shoes - Performed by patient: Don/doff left shoe            Lower body assist        Toileting Toileting   Toileting steps completed by patient: Performs perineal hygiene Toileting steps completed by helper: Adjust clothing prior to toileting,  Adjust clothing after toileting    Toileting assist     Transfers Chair/bed transfer   Chair/bed transfer method: Lateral scoot Chair/bed transfer assist level: Moderate assist (Pt 50 - 74%/lift or lower) Chair/bed transfer assistive device: Armrests     Locomotion Ambulation Ambulation activity did not occur: Safety/medical concerns         Wheelchair   Type: Manual Max wheelchair distance: 75 Assist Level: Touching or steadying assistance (Pt > 75%)   Cognition Comprehension Comprehension assist level: Follows complex conversation/direction with no assist  Expression Expression assist level: Expresses complex ideas: With no assist  Social Interaction Social Interaction assist level: Interacts appropriately with others - No medications needed.  Problem Solving Problem solving assist level: Solves complex problems: Recognizes & self-corrects  Memory Memory assist level: Complete Independence: No helper   Medical Problem List and Plan: 1. Decreased functional mobilitysecondary to right BKA 12/23/2016 -continue CIR therapies 2. DVT Prophylaxis/Anticoagulation: Sq lovenox 3. Pain Management/fibromyalgia: Neurontin 600 mg 3 times a day----has used this long term at home for neuropathic pain in legs---decreased on 2/15 to only 300mg  qhs  -continue Duragesic patch 12.5 g, oxycodone as needed  -added robaxin for spasm  -  low dose oral dilaudid for severe pain  -has high tolerance of pain medications  4. Mood: Valium 2.5 mg daily at bedtime as needed anxiety 5. Neuropsych: This patient iscapable of making decisions on herown behalf. 6. Skin/Wound Care: wound with early signs if dehiscence and incisional necrosis, however stable from Friday  -continue  xeroform,gauze,kerlix dressing daily    7. Fluids/Electrolytes/Nutrition: I personally reviewed the patient's labs today.  8.Acute blood loss anemia. Follow-up CBC with hgb down to 7.5---no signs of acute blood loss  -follow up labs on Monday 9.Atrial fibrillation. Lopressor 50 mg daily and 25 mg daily at bedtime. Cardiac rate control. Follow-up per cardiology services 10.Hypertension. Avapro 75 mg daily, HCTZ 6.25 mg daily. Controlled at present 11.CRI stage III. Creatinine baseline 1.07-1.19. Cr 0.94   12.History of GI bleed. Continue Protonix 13.Constipation. Laxative assistance 14.Decreased nutritional storage. Dietary follow-up 15. Hx of right elbow replacement  2015---5 lb weight limit through elbow   LOS (Days) 5 A FACE TO FACE EVALUATION WAS PERFORMED  Meredith Staggers, MD 01/04/2017 8:17 AM

## 2017-01-05 ENCOUNTER — Inpatient Hospital Stay (HOSPITAL_COMMUNITY): Payer: Medicare Other | Admitting: Physical Therapy

## 2017-01-05 ENCOUNTER — Ambulatory Visit (HOSPITAL_COMMUNITY): Payer: Medicare Other | Admitting: Psychology

## 2017-01-05 ENCOUNTER — Inpatient Hospital Stay (HOSPITAL_COMMUNITY): Payer: Medicare Other | Admitting: Occupational Therapy

## 2017-01-05 DIAGNOSIS — G546 Phantom limb syndrome with pain: Secondary | ICD-10-CM

## 2017-01-05 DIAGNOSIS — F419 Anxiety disorder, unspecified: Secondary | ICD-10-CM

## 2017-01-05 DIAGNOSIS — Z89511 Acquired absence of right leg below knee: Secondary | ICD-10-CM

## 2017-01-05 DIAGNOSIS — G8918 Other acute postprocedural pain: Secondary | ICD-10-CM

## 2017-01-05 LAB — CBC
HCT: 25.8 % — ABNORMAL LOW (ref 36.0–46.0)
Hemoglobin: 8.2 g/dL — ABNORMAL LOW (ref 12.0–15.0)
MCH: 29 pg (ref 26.0–34.0)
MCHC: 31.8 g/dL (ref 30.0–36.0)
MCV: 91.2 fL (ref 78.0–100.0)
PLATELETS: 395 10*3/uL (ref 150–400)
RBC: 2.83 MIL/uL — ABNORMAL LOW (ref 3.87–5.11)
RDW: 14.8 % (ref 11.5–15.5)
WBC: 7.3 10*3/uL (ref 4.0–10.5)

## 2017-01-05 MED ORDER — GERHARDT'S BUTT CREAM
TOPICAL_CREAM | Freq: Two times a day (BID) | CUTANEOUS | Status: DC
  Administered 2017-01-05 – 2017-01-12 (×15): via TOPICAL
  Filled 2017-01-05: qty 1

## 2017-01-05 NOTE — Progress Notes (Signed)
Occupational Therapy Session Note  Patient Details  Name: [name] MRN: [id number] Date of Birth: [birth year]  Today's Date: 01/05/2017 OT Individual Time:  -   1415-1500  (45 min)      Short Term Goals: Week 1:  OT Short Term Goal 1 (Week 1): Pt will be able to perform a squat pivot to the drop arm BSC to the L with min A and to the R with mod A. OT Short Term Goal 2 (Week 1): Pt will be able to bathe LB from bed with set up. OT Short Term Goal 3 (Week 1): Pt will be able to don pants in bed with supervision. OT Short Term Goal 4 (Week 1): Pt will be able to tolerate standing with mod - max A for at least 30 sec during toileting tasks.  Week 2:     Skilled Therapeutic Interventions/Progress Updates:     Pt agreed to participate in OT.   Pt taken to gym and engaged in UE AROM with and without weights (1.5 #).  Had pt perform exeresis without back support for 1 minute x3.  Pt complained of back pain during this part.  Pt stated she had partial replacement in right shoulder and left elbow replacement.  With the left elbow replacement, she is limited to no more than 5 # weight lifting or pulling.  Performed rowing, internal/external rotation, reaching activities.  Daughter present.  Daughter transferred ppt back to bed at end of session with min help from OT (did scooting type transfer).     Therapy Documentation Precautions:  Precautions Precautions: Fall, Other (comment) Precaution Comments: pt not to push or pull with Lt elbow more than 5 lbs (s/p total elbow replacement)  Restrictions Weight Bearing Restrictions: Yes RLE Weight Bearing: Non weight bearing General:   Vital Signs: Therapy Vitals Temp: 98.1 F (36.7 C) Temp Source: Oral Pulse Rate: 76 Resp: 16 BP: (!) 189/65 Patient Position (if appropriate): Lying Oxygen Therapy SpO2: 95 % O2 Device: Not Delivered Pain: Pain Assessment Pain Assessment: 0-10 Pain Score: 5  Pain Type: Acute pain Pain Location:  Buttocks Pain Orientation: Right;Left Pain Descriptors / Indicators: Aching Pain Frequency: Intermittent Pain Onset: Gradual Pain Intervention(s): Medication (See eMAR) ADL: ADL ADL Comments: refer to functional navigator     See Function Navigator for Current Functional Status.   Therapy/Group: Individual Therapy  Lisa Roca 01/05/2017, 7:58 AM

## 2017-01-05 NOTE — Progress Notes (Signed)
Amputation incision black in some areas, skin surrounding incision spongy. Black area of surrounding skin noted on lateral side of incision.   Patient on new air overlay mattress per order to prevent skin breakdown. Patient educated about preventative measure. At end of shift, patient complaining about mattress, sitting in chair. Will report to oncoming RN.

## 2017-01-05 NOTE — Consult Note (Signed)
[facility] Nurse wound consult note Reason for Consult: skin breakdown Patient with recent RLE amputation, now in rehab Patient is seen while in the [facility] and we transferred her from the toilet to the [facility] She reports discomfort in this area Wound type: MASD (moisture associated skin damage) Pressure Injury POA: No Measurement: several scattered areas over the lower coccyx and upper buttocks, partial thickness most aprox. 1cm x 1cm x 0.1cm  Wound bed:all are clean, pink, very superficial, moist Drainage (amount, consistency, odor) none Periwound: intact with blanchable redness Dressing procedure/placement/frequency: Add Gerhardts cream for pain/itching relief and zinc barrier, continue to protect areas with soft silicone foam.   Discussed POC with patient and bedside nurse.  Re consult if needed, will not follow at this time. Thanks  Yobana Culliton [employer], RN,CWOCN, CNS [phone])

## 2017-01-05 NOTE — Progress Notes (Signed)
Physical Therapy Session Note  Patient Details  Name: [name] MRN: [id number] Date of Birth: March 06, 1928  Today's Date: 01/05/2017 PT Individual Time: 0800-0900 and 1300-1400 PT Individual Time Calculation (min): 60 min and 60 min (total 120 min)   Short Term Goals: Week 1:  PT Short Term Goal 1 (Week 1): Pt will transfer w/c <>bed minA PT Short Term Goal 2 (Week 1): Pt will perform sit <>stand modA PT Short Term Goal 3 (Week 1): Pt will perform standing tolerance 2 min with minA PT Short Term Goal 4 (Week 1): Pt will initiate transfer training to/from car PT Short Term Goal 5 (Week 1): Pt will perform bed mobility with S  Skilled Therapeutic Interventions/Progress Updates: Tx 1: pt received supine in bed, c/o pain as below at sacral ulcer but otherwise reports R residual limb improving. Supine>sit with S and bedrails. Squat pivot transfer bed<>BSC with min guard with improving hip clearance over transfer surface. Pt performed hygiene on BSC with setupA; required assist for managing clothing and donning brief for additional skin protection during transfers. Dressing performed EOB with setupA for upper body, minA provided by son for lower body dressing to pull pants up. Therapist obtained roho w/c cushion for improved skin protection and pressure relief; educated pt and son in purpose of new cushion and proper use. Transfer squat pivot bed>w/c with minA, requires several scoots to complete and unable to maintain WB status of L elbow during transfer. Educated pt in importance of maintaining precautions, use of transfer board for skin protection and safety. Transfer w/c <>couch with transfer board and minA, cues for setup and placement of board, and cues for weight bearing through LLE to improve hip clearance and reduce sheering skin. Remained seated in w/c at end of session, son present and all needs in reach.   Tx 2: Pt received seated in w/c, c/o pain 6/10 and RN present towards end of  session with pain medication as pt was not due for medication at start of session. Discussed d/c plan with pt, husband and daughter and proposed that pt perform slide board transfers with husband, husband able to place and stabilize the board, and can continue squat pivot transfers with daughter when she is present. Although not currently recommending 24/7 supervision/assist, did discuss with daughter possibility of getting hired caregiver for a few hours a day while she is at work. Husband and pt performed transfer w/c <>bed and w/c <>couch with transfer board and setupA; required min/mod cueing for w/c setup, management of w/c parts and positioning of chair. Sit <>stand x5 reps from w/c in parallel bars with minA. Sit >stand x3 trials from w/c with LUE platform RW; completed stand x1 trial maxA, and two unsuccessful attempts d/t pt reporting her leg is "giving out". Remained seated in w/c at end of session, all needs in reach and family, RN present.      Therapy Documentation Precautions:  Precautions Precautions: Fall, Other (comment) Precaution Comments: pt not to push or pull with Lt elbow more than 5 lbs (s/p total elbow replacement)  Restrictions Weight Bearing Restrictions: Yes RLE Weight Bearing: Non weight bearing Pain: Pain Assessment Pain Assessment: 0-10 Pain Score: 5  Pain Type: Acute pain Pain Location: Buttocks Pain Orientation: Right;Left Pain Descriptors / Indicators: Aching Pain Frequency: Intermittent Pain Onset: Gradual Pain Intervention(s): Medication (See eMAR)   See Function Navigator for Current Functional Status.   Therapy/Group: Individual Therapy  [name] 01/05/2017, 9:00 AM

## 2017-01-05 NOTE — Consult Note (Signed)
Neuropsychological/psychological consultation note    Patient:   [name]   DOB:   [birth year]  MR Number:  [id number]  Location:  [facility] [address] [id number] [place] [address] Dept: [id number]           Date of Service:   01/05/2017  Start Time:   9 AM End Time:   9:50 AM  Provider/Observer:  Edgardo Roys PSYD       Billing Code/Service: [phone]  Chief Complaint:     Chief Complaint  Patient presents with  . Anxiety  . Stress    Reason for Service:  Patient was referred for psychological/neuropsychological consultation. 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 The patient has been experiencing a lot of anxiety and worry which she primarily attributes to phantom limb pain. However, she has told some of the other providers that she is tired and does not really want to do all of the treatment and intervention efforts that are needed. There does appear to be an increasing amount of anxiety on the patient's part and difficulty adjusting and adapting to the loss of her leg and the impact it has gotten a half going forward on her life.  Current Status:  Patient has been experiencing increasing anxiety and worry. She does have a prior history of some anxiety and the current situation is exacerbated these symptoms.  Reliability of Information: Information was derived from a review of her medical record, consultation with staff members involved in her case, as well as an hour-long clinical interview that included some discussions with her son.  Behavioral Observation: [name]  presents as a 83 y.o.-year-old Right Caucasian Female who appeared her stated age. her dress was Appropriate and she was Well Groomed and her manners were Appropriate to the situation.  her participation was indicative of  Appropriate behaviors.  There were physical disabilities noted related to the recent below the knee amputation of her right leg..  she displayed an appropriate level of cooperation and motivation.     Interactions:    Active Attentive  Attention:   within normal limits and attention span and concentration were age appropriate  Memory:   within normal limits; recent and remote memory intact  Visuo-spatial:  within normal limits  Speech (Volume):  low  Speech:   normal; there were no indications of any receptive or expressive language  Thought Process:  Coherent  Though Content:  WNL; no indications of any hallucinations or delusions or other disturbance in her thought process.  Orientation:   person, place, time/date and situation  Judgment:   Fair  Planning:   Fair  Affect:    Anxious  Mood:    Anxious  Insight:   Fair  Intelligence:   normal  Medical History:   Past Medical History:  Diagnosis Date  . Anemia   . Anginal pain ([facility])   . Arthritis    "qwhere" (09/05/2016)  . Atrial fibrillation ([facility]) 09/2016  . Chronic lower back pain   . Complication of anesthesia    "takes a long time for it to wear off; I can hallucinate if I take too much" (09/05/2016)  . DVT (deep venous thrombosis) ([facility] [facility]) 10/2009  . Fall from steps 08/31/2013   Fx. pelvis, Left Hip, Left Elbow  . Fibromyalgia   . GERD (gastroesophageal reflux disease)  09/22/16- "no too much anymore"  . GI bleed 10/24/2016  . High cholesterol   . History of blood transfusion   . History of hiatal hernia   . Hypertension   . Macular degeneration, wet ([facility])    "started in right eye; now legally blind in that eye; now started in left eye but pretty much in control" (09/05/2016)  . Osteoporosis   . Peripheral vascular disease ([facility])    nonviable tissue Right foot  . PONV (postoperative nausea and vomiting)   . Squamous cell carcinoma of skin of right calf Aug. 2015  . Stroke [facility])    TIA's no residual  .  TIA (transient ischemic attack)    "several at once; none in a long time" (09/05/2016)        Facility-Administered Encounter Medications as of 01/05/2017  Medication  . acetaminophen (TYLENOL) tablet 325-650 mg  . bisacodyl (DULCOLAX) suppository 10 mg  . clopidogrel (PLAVIX) tablet 75 mg  . diazepam (VALIUM) tablet 2.5 mg  . enoxaparin (LOVENOX) injection 30 mg  . feeding supplement (PRO-STAT SUGAR FREE 64) liquid 30 mL  . fentaNYL (DURAGESIC - dosed mcg/hr) 12.5 mcg  . gabapentin (NEURONTIN) capsule 300 mg  . guaiFENesin-dextromethorphan (ROBITUSSIN DM) 100-10 MG/5ML syrup 15 mL  . hydrochlorothiazide 10 mg/mL oral suspension 6.25 mg  . HYDROmorphone (DILAUDID) tablet 2 mg  . irbesartan (AVAPRO) tablet 75 mg  . lactose free nutrition (BOOST PLUS) liquid 237 mL  . loperamide (IMODIUM) capsule 2 mg  . methocarbamol (ROBAXIN) tablet 500 mg  . metoprolol (LOPRESSOR) tablet 50 mg  . metoprolol tartrate (LOPRESSOR) tablet 25 mg  . multivitamin with minerals tablet 1 tablet  . naloxone [facility]) injection 0.4 mg  . oxyCODONE (Oxy IR/ROXICODONE) immediate release tablet 5 mg  . pantoprazole (PROTONIX) EC tablet 40 mg  . phenol (CHLORASEPTIC) mouth spray 1 spray  . promethazine (PHENERGAN) tablet 12.5 mg   Or  . promethazine (PHENERGAN) suppository 12.5 mg   Or  . promethazine (PHENERGAN) injection 12.5 mg  . senna (SENOKOT) tablet 8.6 mg  . sorbitol 70 % solution 30 mL   Outpatient Encounter Prescriptions as of 01/05/2017  Medication Sig  . acetaminophen (TYLENOL) 500 MG tablet Take 1,000 mg by mouth 2 (two) times daily as needed for moderate pain or headache. Patient took this medication for her pain.  . Cholecalciferol (VITAMIN D3) 2000 UNITS TABS Take 2,000 Units by mouth at bedtime.   . clopidogrel (PLAVIX) 75 MG tablet Take 75 mg by mouth daily.  . Cyanocobalamin (VITAMIN B-12 PO) Take 1 tablet by mouth daily.  . diazepam (VALIUM) 5 MG tablet Take 2.5 mg by mouth at bedtime as  needed for anxiety.   . gabapentin (NEURONTIN) 300 MG capsule Take 2 capsules (600 mg total) by mouth 2 (two) times daily.  Marland Kitchen HYDROcodone-acetaminophen (NORCO/VICODIN) 5-325 MG tablet Take 1-2 tablets by mouth every 4 (four) hours as needed for moderate pain.  Marland Kitchen loperamide (IMODIUM) 2 MG capsule Take 2 mg by mouth as needed for diarrhea or loose stools.  . metoprolol (LOPRESSOR) 50 MG tablet Take 1 tablet (50 mg total) by mouth 2 (two) times daily. Take 50mg  by mouth in the morning and take 25mg  by mouth at bedtime. (Patient taking differently: Take 50 mg by mouth 2 (two) times daily. )  . Multiple Vitamins-Minerals (EYE VITAMINS PO) Take 1 tablet by mouth at bedtime.   . pantoprazole (PROTONIX) 40 MG tablet Take 1 tablet (40 mg total) by mouth daily.  Marland Kitchen  valsartan-hydrochlorothiazide (DIOVAN-HCT) 160-12.5 MG per tablet Take 0.5 tablets by mouth daily.          Sexual History:   History  Sexual Activity  . Sexual activity: Not on file    Abuse/Trauma History: Patient has recently gone through the trauma of below the knee amputation of her dominant leg.  Psychiatric History:  Has a prior history of anxiety and this is clearly exacerbated the symptoms.  Family Med/Psych History:  Family History  Problem Relation Age of Onset  . Heart disease Father     Heart Disease before age 44  . Hyperlipidemia Father   . Hypertension Father   . Alcohol abuse Father   . Heart disease Brother   . Hyperlipidemia Brother   . Hypertension Brother   . Deep vein thrombosis Brother   . Heart disease Son     Heart Disease before age 60  . Hyperlipidemia Son   . Hypertension Son   . Heart attack Son   . Diabetes Son   . Hypertension Son   . Hyperlipidemia Sister   . Hypertension Sister     Risk of Suicide/Violence: moderate while the patient denies any suicidal ideation she has verbalized some frustrations with her deteriorating functioning and describing herself as being "tired" and not really  wanting to participate in physical therapy. The patient has reporting that she will do this for her family. I do not think that the patient is suicidal but she is very frustrated with her significant loss of functioning and the Phantom limb pain and other difficulties she has had postoperatively.  Impression/DX:  Issues recently gone through below-knee amputation of her right leg. She has experienced anxiety and worry and frustration over the loss of functioning.  Disposition/Plan:  Today I address some of these issues with the patient as well as her son and we began talking about some of the adaptive and coping mechanisms. I am available to have further consultation with this patient to continue to work on some of these underlying frustrations.  Diagnosis:    Phantom limb pain ([facility])  Status post below knee amputation of right lower extremity ([facility])  Anxiety         Electronically Signed   _______________________ Ilean Skill, Psy.D.

## 2017-01-05 NOTE — Progress Notes (Signed)
Occupational Therapy Session Note  Patient Details  Name: [name] MRN: [id number] Date of Birth: January 16, 1928  Today's Date: 01/05/2017 OT Individual Time: [id number] OT Individual Time Calculation (min): 50 min    Short Term Goals: Week 1:  OT Short Term Goal 1 (Week 1): Pt will be able to perform a squat pivot to the drop arm BSC to the L with min A and to the R with mod A. OT Short Term Goal 2 (Week 1): Pt will be able to bathe LB from bed with set up. OT Short Term Goal 3 (Week 1): Pt will be able to don pants in bed with supervision. OT Short Term Goal 4 (Week 1): Pt will be able to tolerate standing with mod - max A for at least 30 sec during toileting tasks.   Skilled Therapeutic Interventions/Progress Updates:    Pt seen this session to address transfers and generalized strength. Pt was already dressed for the day. Plan to complete a shower tomorrow AM.  Pt very focused on sore bottom and leg pain but agreeable to therapy.  Pt taken to gym and completed a very smooth transfer using the slide board and small squat pivots across board with close S.  Pt stated she is getting much better about not using her L arm.  From mat, worked on wt shifting core exercises in sitting for pressure relief and core strength and in supine she worked on AROM of B knee extension with bolster under legs to strengthen quads.  Pt was able to repeat demo a stretch of L shoulder.  Discussed at length her desire to go home soon.  Reviewed the need for pt to be more I with transfers and time for family education with spouse.  Pt taken back to room and reviewed discussion with her daughter.   Therapy Documentation Precautions:  Precautions Precautions: Fall, Other (comment) Precaution Comments: pt not to push or pull with Lt elbow more than 5 lbs (s/p total elbow replacement)  Restrictions Weight Bearing Restrictions: Yes RLE Weight Bearing: Non weight bearing    Vital Signs: Therapy Vitals Temp: 98.1  F (36.7 C) Temp Source: Oral Pulse Rate: 76 Resp: 16 BP: (!) 189/65 Patient Position (if appropriate): Lying Oxygen Therapy SpO2: 95 % O2 Device: Not Delivered Pain: Pain Assessment Pain Assessment: 0-10 Pain Score: 5  Pain Type: Acute pain Pain Location: Buttocks Pain Orientation: Right;Left Pain Descriptors / Indicators: Aching Pain Frequency: Intermittent Pain Onset: Gradual Pain Intervention(s): Medication (See eMAR) ADL: ADL ADL Comments: refer to functional navigator  See Function Navigator for Current Functional Status.   Therapy/Group: Individual Therapy  SAGUIER,JULIA 01/05/2017, 8:27 AM

## 2017-01-05 NOTE — Progress Notes (Signed)
[facility] PHYSICAL MEDICINE & REHABILITATION     PROGRESS NOTE    Subjective/Complaints: Had another good night. Pain control better.   ROS: pt denies nausea, vomiting, diarrhea, cough, shortness of breath or chest pain     Objective: Vital Signs: Blood pressure (!) 189/65, pulse 76, temperature 98.1 F (36.7 C), temperature source Oral, resp. rate 16, height 5\' 2"  (1.575 m), weight 50.2 kg (110 lb 10.7 oz), SpO2 95 %. No results found.  Recent Labs  01/05/17 0300  WBC 7.3  HGB 8.2*  HCT 25.8*  PLT 395   No results for input(s): NA, K, CL, GLUCOSE, BUN, CREATININE, CALCIUM in the last 72 hours.  Invalid input(s): CO CBG (last 3)  No results for input(s): GLUCAP in the last 72 hours.  Wt Readings from Last 3 Encounters:  01/05/17 50.2 kg (110 lb 10.7 oz)  12/30/16 48.2 kg (106 lb 3.2 oz)  12/01/16 58.2 kg (128 lb 6.4 oz)    Physical Exam:  HENT: oral mucosa pink Head: Normocephalic.  Eyes: EOMI.  Neck: Normal range of motion. Neck supple. No thyromegalypresent.  Cardiovascular: RRR Respiratory:CTA B GI: Soft. Bowel sounds are normal. She exhibits no distension. There is no tenderness.  Psychiatric: She has a normal mood and affect. Her behavior is normal.  Skin. BKA incision intact and well approximated. Minimal s/s drainage lateral incision 01/04/17 photo:     Musculoskeletal. Arthritic changes in both hands. Right bk stump swollen. Neurological: She remains oriented to person, place, and time. No cranial nerve deficit.   Intrinsic minus left foot. Stocking glove sensory loss up to leftknee and to a lesser extent in the hands. UE remains 4+ to 5/5 with some weakness in both hands. LLE grossly 3-4/5 proximal to distal. can lift right leg against gravity.  Psych: pleasant and appropriate.  Assessment/Plan: 1. Functional deficits secondary to right BKA which require 3+ hours per day of interdisciplinary therapy in a comprehensive inpatient rehab  setting. Physiatrist is providing close team supervision and 24 hour management of active medical problems listed below. Physiatrist and rehab team continue to assess barriers to discharge/monitor patient progress toward functional and medical goals.  Function:  Bathing Bathing position   Position: Shower  Bathing parts Body parts bathed by patient: Right arm, Left arm, Chest, Abdomen, Front perineal area, Right upper leg, Left upper leg, Buttocks, Back Body parts bathed by helper: Left lower leg  Bathing assist        Upper Body Dressing/Undressing Upper body dressing   What is the patient wearing?: Pull over shirt/dress     Pull over shirt/dress - Perfomed by patient: Thread/unthread left sleeve, Pull shirt over trunk Pull over shirt/dress - Perfomed by helper: Put head through opening        Upper body assist        Lower Body Dressing/Undressing Lower body dressing   What is the patient wearing?: Pants, Shoes     Pants- Performed by patient: Thread/unthread right pants leg, Thread/unthread left pants leg Pants- Performed by helper: Pull pants up/down   Non-skid slipper socks- Performed by helper: Don/doff left sock     Shoes - Performed by patient: Don/doff left shoe            Lower body assist        Toileting Toileting   Toileting steps completed by patient: Performs perineal hygiene Toileting steps completed by helper: Adjust clothing prior to toileting, Adjust clothing after toileting    Toileting assist  Transfers Chair/bed transfer   Chair/bed transfer method: Lateral scoot Chair/bed transfer assist level: Moderate assist (Pt 50 - 74%/lift or lower) Chair/bed transfer assistive device: Armrests     Locomotion Ambulation Ambulation activity did not occur: Safety/medical concerns         Wheelchair   Type: Manual Max wheelchair distance: 75 Assist Level: Touching or steadying assistance (Pt > 75%)  Cognition Comprehension  Comprehension assist level: Follows complex conversation/direction with no assist  Expression Expression assist level: Expresses complex ideas: With no assist  Social Interaction Social Interaction assist level: Interacts appropriately with others - No medications needed.  Problem Solving Problem solving assist level: Solves complex problems: Recognizes & self-corrects  Memory Memory assist level: Complete Independence: No helper   Medical Problem List and Plan: 1. Decreased functional mobilitysecondary to right BKA 12/23/2016 -continue CIR therapies-pt is motivated 2. DVT Prophylaxis/Anticoagulation: Sq lovenox 3. Pain Management/fibromyalgia: Neurontin 600 mg 3 times a day----has used this long term at home for neuropathic pain in legs---decreased on 2/15 to only 300mg  qhs  -continue Duragesic patch 12.5 g, oxycodone as needed  -added robaxin for spasm  -prn  low dose oral dilaudid for severe pain  -has high tolerance of pain medications  4. Mood: Valium 2.5 mg daily at bedtime as needed anxiety 5. Neuropsych: This patient iscapable of making decisions on herown behalf. 6. Skin/Wound Care: wound with early signs if dehiscence and incisional necrosis, however stable from Friday  -continue  xeroform,gauze,kerlix dressing daily--wound stable but will require longer healing    7. Fluids/Electrolytes/Nutrition: I personally reviewed the patient's labs today.  8.Acute blood loss anemia. Follow-up CBC with hgb back up to 8.2  -continue to follow clnically 9.Atrial fibrillation. Lopressor 50 mg daily and 25 mg daily at bedtime. Cardiac rate control. Follow-up per cardiology services 10.Hypertension. Avapro 75 mg daily, HCTZ 6.25 mg daily. Controlled at present 11.CRI stage III. Creatinine baseline 1.07-1.19. Cr 0.94   12.History of GI bleed. Continue Protonix 13.Constipation. Laxative assistance 14.Decreased nutritional storage. Dietary follow-up 15. Hx of right elbow  replacement 2015---5 lb weight limit through elbow   LOS (Days) 6 A FACE TO FACE EVALUATION WAS PERFORMED  Meredith Staggers, MD 01/05/2017 8:36 AM

## 2017-01-06 ENCOUNTER — Inpatient Hospital Stay (HOSPITAL_COMMUNITY): Payer: Medicare Other | Admitting: Physical Therapy

## 2017-01-06 ENCOUNTER — Inpatient Hospital Stay (HOSPITAL_COMMUNITY): Payer: Medicare Other | Admitting: Occupational Therapy

## 2017-01-06 LAB — CREATININE, SERUM
CREATININE: 1.03 mg/dL — AB (ref 0.44–1.00)
GFR, EST AFRICAN AMERICAN: 55 mL/min — AB (ref >60)
GFR, EST NON AFRICAN AMERICAN: 48 mL/min — AB (ref >60)

## 2017-01-06 NOTE — Progress Notes (Signed)
Patient was complaining about the air overpay mattress that it's uncomfortable, RN educated her about the benefit of the mattress and she verbally express understanding of the benefit of the mattress.

## 2017-01-06 NOTE — Progress Notes (Signed)
Nutrition Follow-up  DOCUMENTATION CODES:   Severe malnutrition in context of chronic illness  INTERVENTION:  Continue 30 ml Prostat po BID, each supplement provides 100 kcal and 15 grams of protein.   Continue Boost Plus po BID, each supplement provides 360 kcal and 14 grams of protein.   Encourage adequate PO intake.   NUTRITION DIAGNOSIS:   Malnutrition related to chronic illness as evidenced by percent weight loss, severe depletion of muscle mass; ongoing  GOAL:   Patient will meet greater than or equal to 90% of their needs; progressing  MONITOR:   PO intake, Supplement acceptance, Labs, Weight trends, Skin, I & O's  REASON FOR ASSESSMENT:   Malnutrition Screening Tool    ASSESSMENT:   83 y.o. right handed female with history of right elbow replacement and atrial fibrillation maintained on Plavix and aspirin, GI bleed, chronic low back pain, fibromyalgia, CRI stage III creatinine 1.07-1.19, hypertension, peripheral vascular disease with history of femoral-popliteal bypass grafting 09/24/2016.  Presented 12/10/2016 with chronic right toe wound with increasing pain and ischemic changes. 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   Meal completion has been 50-100%, however poor po today due to complaints of nausea. Pt currently has Boost and Prostat ordered. RD to continue with current orders to aid in caloric and protein needs. Pt encouraged to eat her foods at meals for adequate nutrition.   Labs and medications reviewed.   Diet Order:  Diet regular Room service appropriate? Yes; Fluid consistency: Thin  Skin:  Wound (see comment) (Stage II to sacrum, incision on R leg)  Last BM:  2/19  Height:   Ht Readings from Last 1 Encounters:  12/30/16 5\' 2"  (1.575 m)    Weight:   Wt Readings from Last 1 Encounters:  01/06/17 121 lb 4.8 oz (55 kg)    Ideal Body Weight:  46.75 kg (adjusted for R BKA)  BMI:  Body mass index is 22.19 kg/m.  Estimated  Nutritional Needs:   Kcal:  1450-1650  Protein:  65-75 grams  Fluid:  >/= 1.5 L/day  EDUCATION NEEDS:   Education needs addressed  Corrin Parker, MS, RD, LDN Pager # [phone] After hours/ weekend pager # [phone]

## 2017-01-06 NOTE — Progress Notes (Signed)
PHYSICAL MEDICINE & REHABILITATION     PROGRESS NOTE    Subjective/Complaints: Didn't sleep as well last night. Struggled with air mattress. Buttock area a little sore. Leg feeling better  ROS: pt denies nausea, vomiting, diarrhea, cough, shortness of breath or chest pain      Objective: Vital Signs: Blood pressure (!) 170/58, pulse 68, temperature 98 F (36.7 C), temperature source Oral, resp. rate 19, height 5\' 2"  (1.575 m), weight 55 kg (121 lb 4.8 oz), SpO2 98 %. No results found.  Recent Labs  01/05/17 0300  WBC 7.3  HGB 8.2*  HCT 25.8*  PLT 395    Recent Labs  01/06/17 0453  CREATININE 1.03*   CBG (last 3)  No results for input(s): GLUCAP in the last 72 hours.  Wt Readings from Last 3 Encounters:  01/06/17 55 kg (121 lb 4.8 oz)  12/30/16 48.2 kg (106 lb 3.2 oz)  12/01/16 58.2 kg (128 lb 6.4 oz)    Physical Exam:  HENT: oral mucosa pink Head: Normocephalic.  Eyes: EOMI.  Neck: Normal range of motion. Neck supple. No thyromegalypresent.  Cardiovascular: RRR Respiratory:CTA B GI: Soft. Bowel sounds are normal. She exhibits no distension. There is no tenderness.  Psychiatric: She has a normal mood and affect. Her behavior is normal.  Skin. BKA incision intact and well approximated. Minimal s/s drainage lateral incision 01/04/17 photo:    Sacral area with redness and excoriation. Area of tear near coccyx--somewhat tender Musculoskeletal. Arthritic changes in both hands. Right bk stump swollen. Neurological: She remains oriented to person, place, and time. No cranial nerve deficit.   Intrinsic minus left foot. Stocking glove sensory loss up to leftknee and to a lesser extent in the hands. UE remains 4+ to 5/5 with some weakness in both hands. LLE grossly 3-4/5 proximal to distal. can lift right leg against gravity.  Psych: pleasant and appropriate.  Assessment/Plan: 1. Functional deficits secondary to right BKA which require 3+ hours per  day of interdisciplinary therapy in a comprehensive inpatient rehab setting. Physiatrist is providing close team supervision and 24 hour management of active medical problems listed below. Physiatrist and rehab team continue to assess barriers to discharge/monitor patient progress toward functional and medical goals.  Function:  Bathing Bathing position   Position: Shower  Bathing parts Body parts bathed by patient: Right arm, Left arm, Chest, Abdomen, Front perineal area, Right upper leg, Left upper leg, Buttocks, Back Body parts bathed by helper: Left lower leg  Bathing assist        Upper Body Dressing/Undressing Upper body dressing   What is the patient wearing?: Pull over shirt/dress     Pull over shirt/dress - Perfomed by patient: Thread/unthread left sleeve, Pull shirt over trunk, Put head through opening, Thread/unthread right sleeve Pull over shirt/dress - Perfomed by helper: Put head through opening        Upper body assist Assist Level: Supervision or verbal cues      Lower Body Dressing/Undressing Lower body dressing   What is the patient wearing?: Pants, Shoes     Pants- Performed by patient: Thread/unthread right pants leg, Thread/unthread left pants leg Pants- Performed by helper: Pull pants up/down   Non-skid slipper socks- Performed by helper: Don/doff left sock     Shoes - Performed by patient: Don/doff left shoe            Lower body assist Assist for lower body dressing: Touching or steadying assistance (Pt > 75%)  Toileting Toileting   Toileting steps completed by patient: Performs perineal hygiene Toileting steps completed by helper: Adjust clothing prior to toileting, Adjust clothing after toileting Toileting Assistive Devices: Grab bar or rail  Toileting assist Assist level: Touching or steadying assistance (Pt.75%)   Transfers Chair/bed transfer   Chair/bed transfer method: Lateral scoot Chair/bed transfer assist level: Touching or  steadying assistance (Pt > 75%) Chair/bed transfer assistive device: Armrests, Sliding board     Locomotion Ambulation Ambulation activity did not occur: Safety/medical concerns         Wheelchair   Type: Manual Max wheelchair distance: 75 Assist Level: Touching or steadying assistance (Pt > 75%)  Cognition Comprehension Comprehension assist level: Follows complex conversation/direction with no assist  Expression Expression assist level: Expresses complex ideas: With no assist  Social Interaction Social Interaction assist level: Interacts appropriately with others - No medications needed.  Problem Solving Problem solving assist level: Solves complex problems: Recognizes & self-corrects  Memory Memory assist level: Complete Independence: No helper   Medical Problem List and Plan: 1. Decreased functional mobilitysecondary to right BKA 12/23/2016 -continue CIR therapies-team conference today 2. DVT Prophylaxis/Anticoagulation: Sq lovenox 3. Pain Management/fibromyalgia: Neurontin 600 mg 3 times a day----has used this long term at home for neuropathic pain in legs---decreased on 2/15 to only 300mg  qhs  -continue Duragesic patch 12.5 g, oxycodone as needed  -added robaxin for spasm  -continue prn low dose oral dilaudid for severe pain  -has high tolerance of pain medications  4. Mood: Valium 2.5 mg daily at bedtime as needed anxiety 5. Neuropsych: This patient iscapable of making decisions on herown behalf. 6. Skin/Wound Care: wound with early signs if dehiscence and incisional necrosis, however stable from Friday  -continue  xeroform,gauze,kerlix dressing daily--wound stable but will require longer healing   -reinforced need for air mattress to allow improvement of skin in sacral area  -keep area dry  -appreciate WOC follow up, gerhardt's cream 7. Fluids/Electrolytes/Nutrition: I personally reviewed the patient's labs today.  8.Acute blood loss anemia. Follow-up  CBC with hgb back up to 8.2  -continue to follow clnically---recheck labs later this week 9.Atrial fibrillation. Lopressor 50 mg daily and 25 mg daily at bedtime. Cardiac rate control. Follow-up per cardiology services 10.Hypertension. Avapro 75 mg daily, HCTZ 6.25 mg daily. Controlled at present 11.CRI stage III. Creatinine baseline 1.07-1.19. Cr 0.94   12.History of GI bleed. Continue Protonix 13.Constipation. Laxative assistance 14.Decreased nutritional storage. Dietary follow-up 15. Hx of right elbow replacement 2015---5 lb weight limit through elbow   LOS (Days) 7 A FACE TO FACE EVALUATION WAS PERFORMED  Meredith Staggers, MD 01/06/2017 8:50 AM

## 2017-01-06 NOTE — Progress Notes (Signed)
Occupational Therapy Session Note  Patient Details  Name: [name] MRN: [id number] Date of Birth: [birth year]  Today's Date: 01/06/2017 OT Individual Time: 1130-1200 OT Individual Time Calculation (min): 30 min    Short Term Goals: Week 1:  OT Short Term Goal 1 (Week 1): Pt will be able to perform a squat pivot to the drop arm BSC to the L with min A and to the R with mod A. OT Short Term Goal 2 (Week 1): Pt will be able to bathe LB from bed with set up. OT Short Term Goal 3 (Week 1): Pt will be able to don pants in bed with supervision. OT Short Term Goal 4 (Week 1): Pt will be able to tolerate standing with mod - max A for at least 30 sec during toileting tasks.   Skilled Therapeutic Interventions/Progress Updates:    Pt seen this session to facilitate LLE strength with sit to stand skills.  Pt agreeable but did want to spend time talking about how uncomfortable she finds the air mattress and how anxious she is to get home.   Pt taken to a set of bars in ortho gym and w/c placed directly in front of bars. Pt was able to scoot to the edge and place her R hand on bar. Therapist support under L axilla for sit to stand with min-mod A. She then could place left hand on bar and hold balance with min A for up to 30 seconds at a time. She could only repeat that 2 more times. Spent time discussing family education that needs to continue with her spouse for transfer training.  Pt taken back to room to meet with family.  Therapy Documentation Precautions:  Precautions Precautions: Fall, Other (comment) Precaution Comments: pt not to push or pull with Lt elbow more than 5 lbs (s/p total elbow replacement)  Restrictions Weight Bearing Restrictions: Yes RLE Weight Bearing: Non weight bearing  Pain: Pain Assessment Pain Assessment: 0-10 Pain Score: 6  Pain Type: Phantom pain Pain Location: Leg Pain Orientation: Right Pain Descriptors / Indicators: Aching Pain Frequency: Constant Pain  Onset: On-going Patients Stated Pain Goal: 2 Pain Intervention(s): Medication (See eMAR) Multiple Pain Sites: No ADL: ADL ADL Comments: refer to functional navigator  See Function Navigator for Current Functional Status.   Therapy/Group: Individual Therapy  Robinson Brinkley 01/06/2017, 9:14 AM

## 2017-01-06 NOTE — Progress Notes (Signed)
Occupational Therapy Session Note  Patient Details  Name: DELEYZA TAMES MRN: [id number] Date of Birth: 11-Apr-1928  Today's Date: 01/06/2017 OT Individual Time: 0815-0900 and 1300-1400 OT Individual Time Calculation (min): 60 min    Short Term Goals: Week 1:  OT Short Term Goal 1 (Week 1): Pt will be able to perform a squat pivot to the drop arm BSC to the L with min A and to the R with mod A. OT Short Term Goal 2 (Week 1): Pt will be able to bathe LB from bed with set up. OT Short Term Goal 3 (Week 1): Pt will be able to don pants in bed with supervision. OT Short Term Goal 4 (Week 1): Pt will be able to tolerate standing with mod - max A for at least 30 sec during toileting tasks.   Skilled Therapeutic Interventions/Progress Updates:    Session 1: Upon entering the room, pt supine in bed with son present in the room. Pt with no c/o pain and requesting to take shower. Pt required mod A for squat pivot from bed >wheelchair. Mod A squat pivot from wheelchair to drop arm commode secondary to urgency and no pants on for slide board. Pt performed hygiene herself and needed assistance with clothing management. Squat pivot transfer from wheelchair > TTB for bathing at shower level. Pt needing min A for bathing to wash foot and steady assistance for balance when laterally leaning to wash buttocks. Pt returning to wheelchair in same manner and back to bed to don clothing items. UB shirt donned with set up A. Pt threading pants over B LEs when PT arrived in room. Pt transitioned easily.   Session 2: Upon entering the room, pt seated in wheelchair with daughter present in room. RN present and providing medications. Pt reports feeling nauseated. Pt has been eating very little and taking pain medications which may be the cause of nausea. Pt agreeable to OT intervention. Session focused on problem solving equipment recommendation for toileting. Pt required max verbal cues for management of wheelchair parts  and set up of slide board. Pt unable to maintain weight bearing restrictions during parts of transfer. Pt performed slide board to large drop arm commode chair with increased time and steady assistance. Pt sliding bottoms along board vs picking up. OT educated pt on concerns for further skin breakdown. Pt verbalized understanding. Pt attempted transfer onto standard drop arm commode while needing max cues as well for set up. Pt continued to need steady assistance and to maintain board for safety.Pt returning to wheelchair at end of session. Call bell and all needed items within reach upon exiting the room.   Therapy Documentation Precautions:  Precautions Precautions: Fall, Other (comment) Precaution Comments: pt not to push or pull with Lt elbow more than 5 lbs (s/p total elbow replacement)  Restrictions Weight Bearing Restrictions: Yes RLE Weight Bearing: Non weight bearing General:   Vital Signs: Therapy Vitals Temp: 98.1 F (36.7 C) Temp Source: Oral Pulse Rate: 73 Resp: 18 BP: (!) 154/54 Patient Position (if appropriate): Sitting Oxygen Therapy SpO2: 100 % O2 Device: Not Delivered Pain: Pain Assessment Pain Assessment: 0-10 Pain Score: 5  Pain Type: Phantom pain Pain Location: Leg Pain Orientation: Right Pain Descriptors / Indicators: Aching Pain Frequency: Constant Pain Onset: On-going Patients Stated Pain Goal: 2 Pain Intervention(s): Medication (See eMAR) Multiple Pain Sites: No ADL: ADL ADL Comments: refer to functional navigator Exercises:   Other Treatments:    See Function Navigator for Current Functional  Status.   Therapy/Group: Individual Therapy  Gypsy Decant 01/06/2017, 2:44 PM

## 2017-01-06 NOTE — Progress Notes (Signed)
Physical Therapy Session Note  Patient Details  Name: [name] MRN: [id number] Date of Birth: June 29, 1928  Today's Date: 01/06/2017 PT Individual Time: 0900-1000 PT Individual Time Calculation (min): 60 min   Short Term Goals: Week 1:  PT Short Term Goal 1 (Week 1): Pt will transfer w/c <>bed minA PT Short Term Goal 2 (Week 1): Pt will perform sit <>stand modA PT Short Term Goal 3 (Week 1): Pt will perform standing tolerance 2 min with minA PT Short Term Goal 4 (Week 1): Pt will initiate transfer training to/from car PT Short Term Goal 5 (Week 1): Pt will perform bed mobility with S  Skilled Therapeutic Interventions/Progress Updates: Pt received seated on EOB with handoff from OT; c/o pain as below as well as nausea, but agreeable to treatment. Pt unable to complete pulling up pants with lateral leans; ultimately returned to supine and performed bridging/rolling to pull up pants without assist. Transfer bed>w/c close S and setup for board placement, min verbal cues for technique. Transfer w/c <>mat table with S and setup for board. Seated/supine/sidelying LE strengthening exercises; S for transitional movements between positions, and min cues for technique. Returned to room at end of session; remained seated in w/c with all needs in reach, son present.      Therapy Documentation Precautions:  Precautions Precautions: Fall, Other (comment) Precaution Comments: pt not to push or pull with Lt elbow more than 5 lbs (s/p total elbow replacement)  Restrictions Weight Bearing Restrictions: Yes RLE Weight Bearing: Non weight bearing Pain: Pain Assessment Pain Assessment: 0-10 Pain Score: 6  Pain Type: Phantom pain Pain Location: Leg Pain Orientation: Right Pain Descriptors / Indicators: Aching Pain Frequency: Constant Pain Onset: On-going Patients Stated Pain Goal: 2 Pain Intervention(s): Medication (See eMAR) Multiple Pain Sites: No   See Function Navigator for Current  Functional Status.   Therapy/Group: Individual Therapy  [name] 01/06/2017, 10:02 AM

## 2017-01-07 ENCOUNTER — Inpatient Hospital Stay (HOSPITAL_COMMUNITY): Payer: Medicare Other | Admitting: Physical Therapy

## 2017-01-07 ENCOUNTER — Inpatient Hospital Stay (HOSPITAL_COMMUNITY): Payer: Medicare Other | Admitting: Occupational Therapy

## 2017-01-07 NOTE — Progress Notes (Signed)
[place] PHYSICAL MEDICINE & REHABILITATION     PROGRESS NOTE    Subjective/Complaints: Had a better night. Still doesn't like the bed. Feels that leg pain is improving. Still not eating much  ROS: pt denies nausea, vomiting, diarrhea, cough, shortness of breath or chest pain      Objective: Vital Signs: Blood pressure (!) 166/61, pulse 72, temperature 98.8 F (37.1 C), temperature source Oral, resp. rate 18, height 5\' 2"  (1.575 m), weight 54.8 kg (120 lb 12.8 oz), SpO2 97 %. No results found.  Recent Labs  01/05/17 0300  WBC 7.3  HGB 8.2*  HCT 25.8*  PLT 395    Recent Labs  01/06/17 0453  CREATININE 1.03*   CBG (last 3)  No results for input(s): GLUCAP in the last 72 hours.  Wt Readings from Last 3 Encounters:  01/07/17 54.8 kg (120 lb 12.8 oz)  12/30/16 48.2 kg (106 lb 3.2 oz)  12/01/16 58.2 kg (128 lb 6.4 oz)    Physical Exam:  HENT: oral mucosa pink Head: Normocephalic.  Eyes: EOMI.  Neck: Normal range of motion. Neck supple. No thyromegalypresent.  Cardiovascular: RRR Respiratory: CTA B GI: Soft. Bowel sounds are normal. She exhibits no distension. There is no tenderness.  Psychiatric: She has a normal mood and affect. Her behavior is normal.  Skin. BKA incision intact and well approximated. Minimal s/s drainage lateral incision 2/ 21/18 photo:      Sacral area with redness and excoriation.  -somewhat tender Musculoskeletal. Arthritic changes in both hands. Right bk stump swollen. Neurological: She remains oriented to person, place, and time. No cranial nerve deficit.   Intrinsic minus left foot. Stocking glove sensory loss up to leftknee and to a lesser extent in the hands. UE remains 4+ to 5/5 with some weakness in both hands. LLE grossly 3-4/5 proximal to distal. can lift right leg against gravity.  Psych: pleasant and appropriate.  Assessment/Plan: 1. Functional deficits secondary to right BKA which require 3+ hours per day of  interdisciplinary therapy in a comprehensive inpatient rehab setting. Physiatrist is providing close team supervision and 24 hour management of active medical problems listed below. Physiatrist and rehab team continue to assess barriers to discharge/monitor patient progress toward functional and medical goals.  Function:  Bathing Bathing position   Position: Shower  Bathing parts Body parts bathed by patient: Right arm, Left arm, Chest, Abdomen, Front perineal area, Right upper leg, Left upper leg, Buttocks, Left lower leg Body parts bathed by helper: Back  Bathing assist Assist Level: Touching or steadying assistance(Pt > 75%)      Upper Body Dressing/Undressing Upper body dressing   What is the patient wearing?: Pull over shirt/dress     Pull over shirt/dress - Perfomed by patient: Thread/unthread left sleeve, Pull shirt over trunk, Put head through opening, Thread/unthread right sleeve Pull over shirt/dress - Perfomed by helper: Put head through opening        Upper body assist Assist Level: Set up   Set up : To obtain clothing/put away  Lower Body Dressing/Undressing Lower body dressing   What is the patient wearing?: Pants     Pants- Performed by patient: Thread/unthread right pants leg, Thread/unthread left pants leg Pants- Performed by helper: Pull pants up/down   Non-skid slipper socks- Performed by helper: Don/doff left sock     Shoes - Performed by patient: Don/doff left shoe            Lower body assist Assist for lower body dressing:  Touching or steadying assistance (Pt > 75%)      Toileting Toileting   Toileting steps completed by patient: Performs perineal hygiene Toileting steps completed by helper: Adjust clothing prior to toileting, Adjust clothing after toileting Toileting Assistive Devices: Grab bar or rail  Toileting assist Assist level:  (max A)   Transfers Chair/bed transfer   Chair/bed transfer method: Lateral scoot Chair/bed transfer  assist level: Touching or steadying assistance (Pt > 75%) Chair/bed transfer assistive device: Armrests, Sliding board     Locomotion Ambulation Ambulation activity did not occur: Safety/medical concerns         Wheelchair   Type: Manual Max wheelchair distance: 75 Assist Level: Touching or steadying assistance (Pt > 75%)  Cognition Comprehension Comprehension assist level: Follows complex conversation/direction with no assist  Expression Expression assist level: Expresses complex ideas: With no assist  Social Interaction Social Interaction assist level: Interacts appropriately with others - No medications needed.  Problem Solving Problem solving assist level: Solves complex problems: Recognizes & self-corrects  Memory Memory assist level: Complete Independence: No helper   Medical Problem List and Plan: 1. Decreased functional mobilitysecondary to right BKA 12/23/2016 -continue CIR therapies-  2. DVT Prophylaxis/Anticoagulation: Sq lovenox 3. Pain Management/fibromyalgia: Neurontin 600 mg 3 times a day----has used this long term at home for neuropathic pain in legs---decreased on 2/15 to only 300mg  qhs  -continue Duragesic patch 12.5 g, oxycodone as needed  -added robaxin for spasm  -continue prn low dose oral dilaudid for severe pain  -has high tolerance of pain medications  4. Mood: Valium 2.5 mg daily at bedtime as needed anxiety 5. Neuropsych: This patient iscapable of making decisions on herown behalf. 6. Skin/Wound Care: wound stable to improved. Some of incision margin may be viable  -change to oil emersion dressing ,gauze,kerlix dressing daily--wound stable but will require longer healing   -reinforced need for air mattress to allow improvement of skin in sacral area  -keep area dry  -appreciate WOC follow up, gerhardt's cream 7. Fluids/Electrolytes/Nutrition: encourage po  -appreciate RD assist  -consider appetite stimulant  8.Acute blood loss  anemia. Follow-up CBC with hgb back up to 8.2  -continue to follow clnically---recheck labs later this week 9.Atrial fibrillation. Lopressor 50 mg daily and 25 mg daily at bedtime. Cardiac rate control. Follow-up per cardiology services 10.Hypertension. Avapro 75 mg daily, HCTZ 6.25 mg daily. Controlled at present 11.CRI stage III. Creatinine baseline 1.07-1.19. Cr 0.94   12.History of GI bleed. Continue Protonix 13.Constipation. Laxative assistance 14.Decreased nutritional storage. Dietary follow-up 15. Hx of right elbow replacement 2015---5 lb weight limit through elbow   LOS (Days) 8 A FACE TO FACE EVALUATION WAS PERFORMED  Meredith Staggers, MD 01/07/2017 9:23 AM

## 2017-01-07 NOTE — Progress Notes (Signed)
Occupational Therapy Session Note  Patient Details  Name: [name] MRN: [id number] Date of Birth: [birth year]  Today's Date: 01/07/2017 OT Individual Time: 1015-1130 OT Individual Time Calculation (min): 75 min    Short Term Goals: Week 1:  OT Short Term Goal 1 (Week 1): Pt will be able to perform a squat pivot to the drop arm BSC to the L with min A and to the R with mod A. OT Short Term Goal 2 (Week 1): Pt will be able to bathe LB from bed with set up. OT Short Term Goal 3 (Week 1): Pt will be able to don pants in bed with supervision. OT Short Term Goal 4 (Week 1): Pt will be able to tolerate standing with mod - max A for at least 30 sec during toileting tasks.   Skilled Therapeutic Interventions/Progress Updates:    Pt seen this session to facilitate [facility] transfers with slide board with touching A and A to stabilize board on commode. Used dycem to help stabilize board. Pt worked on lateral leans to doff pants easily and donned back with extra time and extra leans needed.  Pt transferred back to w/c with board with close S.  Taken to gym and completed transfer to mat with board with s. From elevated mat, worked sit to partial stand primarily using LLE not Rue to A.  She was able to elevate hips 12 x. Worked on lateral leans and wt shifts along with trunk rotations. From supine bridges with LLE and AROM of LLE. Pt resting on mat for a few minutes before next session.    Therapy Documentation Precautions:  Precautions Precautions: Fall, Other (comment) Precaution Comments: pt not to push or pull with Lt elbow more than 5 lbs (s/p total elbow replacement)  Restrictions Weight Bearing Restrictions: Yes RLE Weight Bearing: Non weight bearing   Pain: 5/10 pain in RLE, premedicated   ADL: ADL ADL Comments: refer to functional navigator    See Function Navigator for Current Functional Status.   Therapy/Group: Individual Therapy  Diannie Willner 01/07/2017, 12:17 PM

## 2017-01-07 NOTE — Progress Notes (Signed)
Physical Therapy Session Note  Patient Details  Name: [name] MRN: [id number] Date of Birth: February 13, 1928  Today's Date: 01/07/2017 PT Individual Time: 1130-1200 PT Individual Time Calculation (min): 30 min   Short Term Goals: Week 2:  PT Short Term Goal 1 (Week 2): =LTG due to estimated LOS  Skilled Therapeutic Interventions/Progress Updates:    Pt received supine in bed in rehab gym and agreeable to PT. PT instructed patient in supine therex including SAQ with tband resistance to the LLE, isometric hip adduction x 10 BLE, hip abduction with Manual resistance from PT Bridges through the LLE x 5. Sit<>supine x 4 throughout treatment with supervision assist from PT and min cues for safety. SB transfer to [facility] from mat with min assist from PT for board stabilization and placement. Min cues from PT to improve push off seated surface. WC mobility through hall x 186f with BUE with min cues from PT for improved equal force in UE to prevent veering R or L. [facility] and all needs met.     Therapy Documentation Precautions:  Precautions Precautions: Fall, Other (comment) Precaution Comments: pt not to push or pull with Lt elbow more than 5 lbs (s/p total elbow replacement)  Restrictions Weight Bearing Restrictions: Yes RLE Weight Bearing: Non weight bearing    Vital Signs: Therapy Vitals Temp: 98.2 F (36.8 C) Temp Source: Oral Pulse Rate: 80 Resp: 18 BP: (!) 150/60 Patient Position (if appropriate): Sitting Oxygen Therapy SpO2: 99 % O2 Device: Not Delivered Pain: 0/10 at rest  See Function Navigator for Current Functional Status.   Therapy/Group: Individual Therapy  ALorie Phenix2/21/2018, 5:31 PM

## 2017-01-07 NOTE — Progress Notes (Signed)
Physical Therapy Session Note  Patient Details  Name: [name] MRN: [id number] Date of Birth: May 11, 1928  Today's Date: 01/07/2017 PT Individual Time: 1345-1430 PT Individual Time Calculation (min): 45 min  and Today's Date: 01/07/2017 PT Concurrent Time: 1330-1345 PT Concurrent Time Calculation (min): 15 min  Short Term Goals: Week 2:  PT Short Term Goal 1 (Week 2): =LTG due to estimated LOS  Skilled Therapeutic Interventions/Progress Updates: Pt received seated in w/c with daughter present; denies pain and agreeable to treatment. Car transfer performed maxA w/c <>car set at 31" seat height to simulate daughter's car. Requires assist to scoot hips into car due to short leg height and high seat. Plan to perform car transfer tomorrow to daughters car. Transfer w/c <>mat table with transfer board and close S; pt performs or direct care for all steps of transfer including w/c setup, brakes, leg rests and board placement. Sit <>stand x3 trials from mat table with maxA and LUE platform RW. Performed 2 trials sit <>stand from w/c to allow for use of RUE on arm rest; maxA overall. W/c propulsion x75' with BUE and S. Remained seated in w/c at end of session, all needs in reach.      Therapy Documentation Precautions:  Precautions Precautions: Fall, Other (comment) Precaution Comments: pt not to push or pull with Lt elbow more than 5 lbs (s/p total elbow replacement)  Restrictions Weight Bearing Restrictions: Yes RLE Weight Bearing: Non weight bearing   See Function Navigator for Current Functional Status.   Therapy/Group: Individual Therapy  [name] 01/07/2017, 2:54 PM

## 2017-01-07 NOTE — Progress Notes (Signed)
Physical Therapy Weekly Progress Note  Patient Details  Name: [name] MRN: [id number] Date of Birth: December 29, 1927  Beginning of progress report period: December 31, 2016 End of progress report period: January 08, 2016  Today's Date: 01/07/2017 PT Individual Time: 0815-0900 PT Individual Time Calculation (min): 45 min   Patient has met 4 of 5 short term goals.  Pt currently requires S for bed mobility, min guard for slide board transfers, minA squat pivot transfers, and mod/maxA sit <>stand at sink, parallel bars and with RW. Pain and activity tolerance both improving from eval. Pt's family present for every PT session; husband has begun training on assisting with placement/stabilization of transfer board, and daughter performing squat pivot transfers with pt for more difficult transfers including car transfer with surface height difference. Continues to progress towards long term goals.  Patient continues to demonstrate the following deficits muscle weakness, decreased cardiorespiratoy endurance and decreased sitting balance, decreased standing balance, decreased postural control and decreased balance strategies and therefore will continue to benefit from skilled PT intervention to increase functional independence with mobility.  Patient progressing toward long term goals..  Continue plan of care.  PT Short Term Goals Week 1:  PT Short Term Goal 1 (Week 1): Pt will transfer w/c <>bed minA PT Short Term Goal 1 - Progress (Week 1): Met PT Short Term Goal 2 (Week 1): Pt will perform sit <>stand modA PT Short Term Goal 2 - Progress (Week 1): Met PT Short Term Goal 3 (Week 1): Pt will perform standing tolerance 2 min with minA PT Short Term Goal 3 - Progress (Week 1): Progressing toward goal PT Short Term Goal 4 (Week 1): Pt will initiate transfer training to/from car PT Short Term Goal 4 - Progress (Week 1): Met PT Short Term Goal 5 (Week 1): Pt will perform bed mobility with S PT  Short Term Goal 5 - Progress (Week 1): Met Week 2:  PT Short Term Goal 1 (Week 2): =LTG due to estimated LOS  Skilled Therapeutic Interventions/Progress Updates: Pt received supine in bed, denies pain and declining participation initially stating she had just received breakfast; pt missed 15 minutes PT. When therapist returned, pt had transitioned supine>sitting EOB with son providing S. Squat pivot transfer bed>w/c and w/c <> drop arm BSC; required min/modA with pt reporting "I'm not moving as well today". Pt in agreement that transfer board would make transfer easier/safer with less fatigue. Seated on BSC pt performed sponge bathing with setupA, and dressing with setup for upper body and minA for lower body for pulling up pants. Seated at sink pt performed oral hygiene with modI. Sit <>stand at sink x10 reps with maxA faded to [place]. In standing pt performed alternating UE lifts for dynamic balance challenge and reduced reliance on UEs for stability; performed functional reaching to sink handles and items on counter. Remained seated in w/c at end of session, all needs in reach.      Therapy Documentation Precautions:  Precautions Precautions: Fall, Other (comment) Precaution Comments: pt not to push or pull with Lt elbow more than 5 lbs (s/p total elbow replacement)  Restrictions Weight Bearing Restrictions: Yes RLE Weight Bearing: Non weight bearing General: PT Amount of Missed Time (min): 15 Minutes PT Missed Treatment Reason: Other (Comment) (breakfast)   See Function Navigator for Current Functional Status.  Therapy/Group: Individual Therapy  [name] 01/07/2017, 9:34 AM

## 2017-01-08 ENCOUNTER — Inpatient Hospital Stay (HOSPITAL_COMMUNITY): Payer: Medicare Other | Admitting: Physical Therapy

## 2017-01-08 ENCOUNTER — Inpatient Hospital Stay (HOSPITAL_COMMUNITY): Payer: Medicare Other | Admitting: Occupational Therapy

## 2017-01-08 NOTE — Patient Care Conference (Signed)
Inpatient RehabilitationTeam Conference and Plan of Care Update Date: 01/06/2017   Time: 2:05 PM    Patient Name: [name]      Medical Record Number: [id number]  Date of Birth: 04/08/1928 Sex: Female         Room/Bed: 4M04C/4M04C-01 Payor Info: Payor: MEDICARE / Plan: MEDICARE PART A AND B / Product Type: *No Product type* /    Admitting Diagnosis: R BKA  Admit Date/Time:  12/30/2016  7:59 PM Admission Comments: No comment available   Primary Diagnosis:  Status post below knee amputation of right lower extremity ([place]) Principal Problem: Status post below knee amputation of right lower extremity [facility])  Patient Active Problem List   Diagnosis Date Noted  . Post-op pain 01/03/2017  . Phantom limb pain ([place]) 01/03/2017  . Status post below knee amputation of right lower extremity ([place]) 12/30/2016  . Acute on chronic combined systolic and diastolic CHF (congestive heart failure) ([place]) 12/22/2016  . Anemia 12/22/2016  . Palliative care encounter   . Lower extremity pain, right   . Acute GI bleeding 10/23/2016  . Paroxysmal atrial fibrillation ([place]) 10/03/2016  . 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 PAD (peripheral artery disease) ([place]) 09/05/2016  . Groin pain 04/02/2015  . Aftercare following surgery of the circulatory system, [place] 12/13/2013  . Shingles 10/26/2013  . Anxiety 10/26/2013  . Acute blood loss anemia 09/05/2013  . Elbow fracture, left 09/05/2013  . Left acetabular fracture ([place]) 09/05/2013  . Ankle fracture, left 09/05/2013  . HTN (hypertension) 09/03/2013  . HLD (hyperlipidemia) 09/03/2013  . Peripheral vascular disease, unspecified 05/10/2012  . Chronic total occlusion of artery of the extremities ([place]) 01/19/2012    Expected Discharge Date: Expected Discharge Date: 01/13/17  Team Members Present: Physician leading conference: Dr. Alger Simons Social Worker Present: Lennart Pall,  LCSW Nurse Present: Dorien Chihuahua, RN PT Present: Canary Brim, Harriet Pho, PT OT Present: Willeen Cass, OT PPS Coordinator present : Daiva Nakayama, RN, CRRN     Current Status/Progress Goal Weekly Team Focus  Medical   leg pain better, skin tear/buttocks. wound will provide prolonged healing  improve pain control  wound care, pain mgt,   Bowel/Bladder   Continent of bowel/bladder  To remain continent of bowel/bladder while in [facility]  Assess and address bowel/bladder issues q shift and as needed   Swallow/Nutrition/ Hydration             ADL's   min A bathing and dressing; mod A toileting;  min A transfers using slide board  S bathing and dressing; min A toileting; S toilet transfers; min A tub bench transfers  ADL training with DME, activity tolerance, core strengthening, UE exercise to tolerance, pt/family education   Mobility   min A/min guard transfers with transfer board, maxA standing with platform RW, S bed mobility  modI sitting balance, S transfers, minA standing balance; w/c propulsion d/c d/t LUE WB restrictions  transfer training, LE strengthening, activity tolerance   Communication             Safety/Cognition/ Behavioral Observations            Pain   patient continuesly complains of R. leg pain, Oxi IR and Robaxin given  <2  Assess and treat pain q shift and as needed   Skin   R BKA, Ace wrap, MASD on bottoms, barrer cream  Skin to be free from futher skin breakdown/infection  Asses  and address skin issues q shift and as needed    Rehab Goals Patient on target to meet rehab goals: Yes *See Care Plan and progress notes for long and short-term goals.  Barriers to Discharge: hx of right elbow replacement with WB limits    Possible Resolutions to Barriers:  adaptive techniqes and equipment to off load right elbow    Discharge Planning/Teaching Needs:  Plan home with spouse and family providing 24/7 assistance.    Teaching has begun    Team Discussion:   Skin tear/ moisture wound on sacrum and using air mattress while here.  Min-guard assist with sl. Board with spouse.  dtr can do more squat-pivot.  Min assist b/d with overall goals supervision - min assist.  Revisions to Treatment Plan:  None   Continued Need for Acute Rehabilitation Level of Care: The patient requires daily medical management by a physician with specialized training in physical medicine and rehabilitation for the following conditions: Daily direction of a multidisciplinary physical rehabilitation program to ensure safe treatment while eliciting the highest outcome that is of practical value to the patient.: Yes Daily medical management of patient stability for increased activity during participation in an intensive rehabilitation regime.: Yes Daily analysis of laboratory values and/or radiology reports with any subsequent need for medication adjustment of medical intervention for : Post surgical problems;Wound care problems  Estiben Mizuno 01/08/2017, 8:58 AM

## 2017-01-08 NOTE — Progress Notes (Signed)
Social Work Patient ID: [name], female   DOB: January 20, 1928, 83 y.o.   MRN: [id number]  Lowella Curb, LCSW Social Worker Signed   Patient Care Conference Date of Service: 01/08/2017  8:57 AM      Hide copied text Hover for attribution information Inpatient RehabilitationTeam Conference and Plan of Care Update Date: 01/06/2017   Time: 2:05 PM      Patient Name: [name]      Medical Record Number: [id number]  Date of Birth: 1928-02-22 Sex: Female         Room/Bed: 4M04C/4M04C-01 Payor Info: Payor: MEDICARE / Plan: MEDICARE PART A AND B / Product Type: *No Product type* /     Admitting Diagnosis: R BKA  Admit Date/Time:  12/30/2016  7:59 PM Admission Comments: No comment available    Primary Diagnosis:  Status post below knee amputation of right lower extremity ([place]) Principal Problem: Status post below knee amputation of right lower extremity [facility])       Patient Active Problem List    Diagnosis Date Noted  . Post-op pain 01/03/2017  . Phantom limb pain ([place]) 01/03/2017  . Status post below knee amputation of right lower extremity ([place]) 12/30/2016  . Acute on chronic combined systolic and diastolic CHF (congestive heart failure) ([place]) 12/22/2016  . Anemia 12/22/2016  . Palliative care encounter    . Lower extremity pain, right    . Acute GI bleeding 10/23/2016  . Paroxysmal atrial fibrillation ([place]) 10/03/2016  . 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 PAD (peripheral artery disease) ([place]) 09/05/2016  . Groin pain 04/02/2015  . Aftercare following surgery of the circulatory system, [place] 12/13/2013  . Shingles 10/26/2013  . Anxiety 10/26/2013  . Acute blood loss anemia 09/05/2013  . Elbow fracture, left 09/05/2013  . Left acetabular fracture ([place]) 09/05/2013  . Ankle fracture, left 09/05/2013  . HTN (hypertension) 09/03/2013  . HLD (hyperlipidemia) 09/03/2013  . Peripheral vascular disease,  unspecified 05/10/2012  . Chronic total occlusion of artery of the extremities ([place]) 01/19/2012      Expected Discharge Date: Expected Discharge Date: 01/13/17   Team Members Present: Physician leading conference: Dr. Alger Simons Social Worker Present: Lennart Pall, LCSW Nurse Present: Dorien Chihuahua, RN PT Present: Canary Brim, Harriet Pho, PT OT Present: Willeen Cass, OT PPS Coordinator present : Daiva Nakayama, RN, CRRN       Current Status/Progress Goal Weekly Team Focus  Medical     leg pain better, skin tear/buttocks. wound will provide prolonged healing  improve pain control  wound care, pain mgt,   Bowel/Bladder     Continent of bowel/bladder  To remain continent of bowel/bladder while in [facility]  Assess and address bowel/bladder issues q shift and as needed   Swallow/Nutrition/ Hydration               ADL's     min A bathing and dressing; mod A toileting;  min A transfers using slide board  S bathing and dressing; min A toileting; S toilet transfers; min A tub bench transfers  ADL training with DME, activity tolerance, core strengthening, UE exercise to tolerance, pt/family education   Mobility     min A/min guard transfers with transfer board, maxA standing with platform RW, S bed mobility  modI sitting balance, S transfers, minA standing balance; w/c propulsion d/c d/t LUE WB restrictions  transfer training, LE strengthening, activity tolerance   Communication  Safety/Cognition/ Behavioral Observations             Pain     patient continuesly complains of R. leg pain, Oxi IR and Robaxin given  <2  Assess and treat pain q shift and as needed   Skin     R BKA, Ace wrap, MASD on bottoms, barrer cream  Skin to be free from futher skin breakdown/infection  Asses and address skin issues q shift and as needed     Rehab Goals Patient on target to meet rehab goals: Yes *See Care Plan and progress notes for long and short-term goals.   Barriers to  Discharge: hx of right elbow replacement with WB limits     Possible Resolutions to Barriers:  adaptive techniqes and equipment to off load right elbow     Discharge Planning/Teaching Needs:  Plan home with spouse and family providing 24/7 assistance.    Teaching has begun    Team Discussion:  Skin tear/ moisture wound on sacrum and using air mattress while here.  Min-guard assist with sl. Board with spouse.  dtr can do more squat-pivot.  Min assist b/d with overall goals supervision - min assist.  Revisions to Treatment Plan:  None    Continued Need for Acute Rehabilitation Level of Care: The patient requires daily medical management by a physician with specialized training in physical medicine and rehabilitation for the following conditions: Daily direction of a multidisciplinary physical rehabilitation program to ensure safe treatment while eliciting the highest outcome that is of practical value to the patient.: Yes Daily medical management of patient stability for increased activity during participation in an intensive rehabilitation regime.: Yes Daily analysis of laboratory values and/or radiology reports with any subsequent need for medication adjustment of medical intervention for : Post surgical problems;Wound care problems   Yunior Jain 01/08/2017, 8:58 AM

## 2017-01-08 NOTE — Progress Notes (Signed)
Social Work Patient ID: [name], female   DOB: December 12, 1927, 83 y.o.   MRN: [id number]   Met with pt and family following team conference.  All aware and agreeable with targeted d/c date of 2/27 with supervision/ min assist goals.  Pt laughing with this SW and family as she tries to persuade [place] to let her go earlier.  She does, however, understand reasoning behind LOS.  Will continue to follow.  Jaydence Arnesen, LCSW

## 2017-01-08 NOTE — Progress Notes (Signed)
PHYSICAL MEDICINE & REHABILITATION     PROGRESS NOTE    Subjective/Complaints: Did well last night. Leg pain controlled. Out pushing w/c on her own in hall  ROS: pt denies nausea, vomiting, diarrhea, cough, shortness of breath or chest pain       Objective: Vital Signs: Blood pressure (!) 181/51, pulse 70, temperature 98.1 F (36.7 C), temperature source Oral, resp. rate 18, height 5\' 2"  (1.575 m), weight 54.2 kg (119 lb 8 oz), SpO2 98 %. No results found. No results for input(s): WBC, HGB, HCT, PLT in the last 72 hours.  Recent Labs  01/06/17 0453  CREATININE 1.03*   CBG (last 3)  No results for input(s): GLUCAP in the last 72 hours.  Wt Readings from Last 3 Encounters:  01/08/17 54.2 kg (119 lb 8 oz)  12/30/16 48.2 kg (106 lb 3.2 oz)  12/01/16 58.2 kg (128 lb 6.4 oz)    Physical Exam:  HENT: oral mucosa pink Head: Normocephalic.  Eyes: EOMI.  Neck: Normal range of motion. Neck supple. No thyromegalypresent.  Cardiovascular: RRR Respiratory: CTA B GI: Soft. Bowel sounds are normal. She exhibits no distension. There is no tenderness.  Psychiatric: She has a normal mood and affect. Her behavior is normal.  Skin.     2/ 21/18 photo: exam stable today      Sacral area with redness and excoriation.  -still somewhat tender Musculoskeletal. Arthritic changes in both hands. Right bk stump swollen. Neurological: She remains oriented to person, place, and time. No cranial nerve deficit.   Intrinsic minus left foot. Stocking glove sensory loss up to leftknee and to a lesser extent in the hands. UE remains 4+ to 5/5 with some weakness in both hands. LLE grossly 4/5 proximal to distal.  RHF 3/5.  Psych: pleasant and appropriate.  Assessment/Plan: 1. Functional deficits secondary to right BKA which require 3+ hours per day of interdisciplinary therapy in a comprehensive inpatient rehab setting. Physiatrist is providing close team supervision and 24 hour  management of active medical problems listed below. Physiatrist and rehab team continue to assess barriers to discharge/monitor patient progress toward functional and medical goals.  Function:  Bathing Bathing position   Position: Shower  Bathing parts Body parts bathed by patient: Right arm, Left arm, Chest, Abdomen, Front perineal area, Right upper leg, Left upper leg, Buttocks, Left lower leg Body parts bathed by helper: Back  Bathing assist Assist Level: Touching or steadying assistance(Pt > 75%)      Upper Body Dressing/Undressing Upper body dressing   What is the patient wearing?: Pull over shirt/dress     Pull over shirt/dress - Perfomed by patient: Thread/unthread left sleeve, Pull shirt over trunk, Put head through opening, Thread/unthread right sleeve Pull over shirt/dress - Perfomed by helper: Put head through opening        Upper body assist Assist Level: Set up   Set up : To obtain clothing/put away  Lower Body Dressing/Undressing Lower body dressing   What is the patient wearing?: Pants     Pants- Performed by patient: Thread/unthread right pants leg, Thread/unthread left pants leg Pants- Performed by helper: Pull pants up/down   Non-skid slipper socks- Performed by helper: Don/doff left sock     Shoes - Performed by patient: Don/doff left shoe            Lower body assist Assist for lower body dressing: Touching or steadying assistance (Pt > 75%)      Toileting Toileting  Toileting steps completed by patient: Performs perineal hygiene Toileting steps completed by helper: Adjust clothing prior to toileting, Adjust clothing after toileting Toileting Assistive Devices: Grab bar or rail  Toileting assist Assist level: Touching or steadying assistance (Pt.75%)   Transfers Chair/bed transfer   Chair/bed transfer method: Lateral scoot Chair/bed transfer assist level: Touching or steadying assistance (Pt > 75%) Chair/bed transfer assistive device:  Sliding board, Armrests     Locomotion Ambulation Ambulation activity did not occur: Safety/medical concerns         Wheelchair   Type: Manual Max wheelchair distance: 159ft  Assist Level: Supervision or verbal cues  Cognition Comprehension Comprehension assist level: Follows complex conversation/direction with no assist  Expression Expression assist level: Expresses complex ideas: With no assist  Social Interaction Social Interaction assist level: Interacts appropriately with others - No medications needed.  Problem Solving Problem solving assist level: Solves complex problems: Recognizes & self-corrects  Memory Memory assist level: Complete Independence: No helper   Medical Problem List and Plan: 1. Decreased functional mobilitysecondary to right BKA 12/23/2016 -continue CIR therapies-  2. DVT Prophylaxis/Anticoagulation: Sq lovenox 3. Pain Management/fibromyalgia: Neurontin 600 mg 3 times a day----has used this long term at home for neuropathic pain in legs---decreased on 2/15 to only 300mg  qhs  -continue Duragesic patch 12.5 g, oxycodone as needed  -continue robaxin for spasm  -continue prn low dose oral dilaudid for severe pain    4. Mood: Valium 2.5 mg daily at bedtime as needed anxiety 5. Neuropsych: This patient iscapable of making decisions on herown behalf. 6. Skin/Wound Care: wound stable to improved. Some of incision margin may actually be viable  -change to oil emersion dressing ,gauze,kerlix dressing daily--wound stable but will require longer healing   -reinforced need for air mattress to allow improvement of skin in sacral area  -keep area dry  -appreciate WOC follow up, gerhardt's cream 7. Fluids/Electrolytes/Nutrition:ate a little better yesterday  -appreciate RD assist  -consider appetite stimulant  8.Acute blood loss anemia. Follow-up CBC with hgb back up to 8.2  -continue to follow clnically---recheck labs later this week 9.Atrial  fibrillation. Lopressor 50 mg daily and 25 mg daily at bedtime. Cardiac rate control. Follow-up per cardiology services 10.Hypertension. Avapro 75 mg daily, HCTZ 6.25 mg daily. Controlled at present 11.CRI stage III. Creatinine baseline 1.07-1.19. Cr 0.94   12.History of GI bleed. Continue Protonix 13.Constipation. Laxative assistance 14.Decreased nutritional storage. Dietary follow-up 15. Hx of right elbow replacement 2015---5 lb weight limit through elbow   LOS (Days) 9 A FACE TO FACE EVALUATION WAS PERFORMED  Meredith Staggers, MD 01/08/2017 8:48 AM

## 2017-01-08 NOTE — Progress Notes (Signed)
Patient BP this was 181/51. Denied any discomfort or chest pain. PA notified, no new order was place. We continue to monitor

## 2017-01-08 NOTE — Progress Notes (Signed)
Occupational Therapy Weekly Progress Note  Patient Details  Name: [name] MRN: [id number] Date of Birth: [birth year]  Beginning of progress report period: December 31, 2016 End of progress report period: January 08, 2017  Today's Date: 01/08/2017 OT Individual Time: 0930-1030 OT Individual Time Calculation (min): 60 min    Patient has met 3 of 4 short term goals.  Pt has been progressing well with her transfer skills for squat pivots with min A and with sliding board with set up/S.  She can stand up with max A but has a tolerance of 10 - 20 seconds. She is performing her ADLs from seated using lateral leans or weight shifts. Family education is ongoing. She continues to need cues at times to not push with her LUE.  Patient continues to demonstrate the following deficits: muscle weakness, decreased cardiorespiratoy endurance and decreased standing balance and difficulty maintaining precautions and therefore will continue to benefit from skilled OT intervention to enhance overall performance with BADL.  Patient progressing toward long term goals..  Continue plan of care.  The standing balance goal downgraded from min A to max A of at least 30 sec during ADLs.   OT Short Term Goals Week 1:  OT Short Term Goal 1 (Week 1): Pt will be able to perform a squat pivot to the drop arm BSC to the L with min A and to the R with mod A. OT Short Term Goal 1 - Progress (Week 1): Met OT Short Term Goal 2 (Week 1): Pt will be able to bathe LB from bed with set up. OT Short Term Goal 2 - Progress (Week 1): Met OT Short Term Goal 3 (Week 1): Pt will be able to don pants in bed with supervision. OT Short Term Goal 3 - Progress (Week 1): Met OT Short Term Goal 4 (Week 1): Pt will be able to tolerate standing with mod - max A for at least 30 sec during toileting tasks.  OT Short Term Goal 4 - Progress (Week 1): Progressing toward goal Week 2:  OT Short Term Goal 1 (Week 2): STGs = LTGs  Skilled  Therapeutic Interventions/Progress Updates:    Pt received in w/c already dressed, pt declined shower as she did not want to change clothing again. Agreeable to practicing tub bench transfers in walk in shower to simulate set up at home. Front of chair positioned in front of edge of bench so R leg can rest on bench. She performed a modified  A/P transfer to bench with only touching A on and off bench.  Pt taken to gym and used board to mat with S.  From mat worked on AROM of BLE, 1# dowel bar for UE AROM, sit to stand from elevated mat pushing up using R arm with min A support balance in the up/down phase.  Pt repeated sit to stand 6x 3 sets.  She was having strong hip clearance so used a squat pivot to wc with only min A to guide hips. Pt taken back to room with all needs met.  Therapy Documentation Precautions:  Precautions Precautions: Fall, Other (comment) Precaution Comments: pt not to push or pull with Lt elbow more than 5 lbs (s/p total elbow replacement)  Restrictions Weight Bearing Restrictions: Yes RLE Weight Bearing: Non weight bearing    Vital Signs: Therapy Vitals Temp: 98.1 F (36.7 C) Temp Source: Oral Pulse Rate: 70 Resp: 18 BP: (!) 181/51 Patient Position (if appropriate): Lying Oxygen Therapy SpO2: 98 %  O2 Device: Not Delivered   Pain: 5/10 R limb, premedicated   ADL: ADL ADL Comments: refer to functional navigator  See Function Navigator for Current Functional Status.   Therapy/Group: Individual Therapy  Tehya Leath 01/08/2017, 8:28 AM

## 2017-01-08 NOTE — Progress Notes (Signed)
Physical Therapy Session Note  Patient Details  Name: [name] MRN: [id number] Date of Birth: [birth year]  Today's Date: 01/08/2017 PT Individual Time: 0800-0900 and 1300-1345 PT Individual Time Calculation (min): 60 min and 45 min (total 105 min)   Short Term Goals: Week 2:  PT Short Term Goal 1 (Week 2): =LTG due to estimated LOS  Skilled Therapeutic Interventions/Progress Updates: Tx 1: Pt received supine in bed, denies pain and agreeable to treatment. Supine>sit with S using bedrails. Dons pants in sitting with modA for threading LLE and pulling pants up with lateral leans. Transfer bed>w/c with transfer board and pt directing care for setup and stabilization of transfer board. W/c propulsion x100' with BUE for strengthening and endurance. Sit <>stand x4 trials total with maxA from w/c, RUE on arm rest and LUE on platform. Standing tolerance improved 5 sec>25 sec with repetition. Transfer w/c <>couch to simulate home environment with pt directing care for student PT to setup board and w/c. Pt returned to room totalA for energy conservation; remained seated in w/c with all needs in reach at end of session.   Tx 2: Pt received seated in w/c, c/o pain as below and agreeable to treatment. Attempted performance of car transfer to daughter's SUV at 31" seat height; trialed x3 with stand pivot and max to +2A however unable to successfully get into car due to pt's short height and high seat height. Discussed options with daughter of driving pt's low car to transport pt to/from hospital and appointments. Daughter reports her husband may be able to build a ramp/platform to elevate height of w/c. Will continue to discuss as d/c approaches. Lateral scoot transfer w/c <>nustep minA. Performed nustep LLE, BUE x4 min total, several rest breaks d/t fatigue. Seated in w/c, performed BLE long arc quads x10 reps. Remained seated in w/c with all needs in reach, daughter present to complete remaining sets of  long arc quads with pt.      Therapy Documentation Precautions:  Precautions Precautions: Fall, Other (comment) Precaution Comments: pt not to push or pull with Lt elbow more than 5 lbs (s/p total elbow replacement)  Restrictions Weight Bearing Restrictions: Yes RLE Weight Bearing: Non weight bearing Pain: Pain Assessment Pain Assessment: 0-10 Pain Score: 4  Faces Pain Scale: Hurts little more Pain Type: Surgical pain Pain Location: Leg Pain Orientation: Right Pain Descriptors / Indicators: Aching Pain Frequency: Constant Pain Onset: On-going Patients Stated Pain Goal: 2 Pain Intervention(s): Medication (See eMAR) Multiple Pain Sites: No    See Function Navigator for Current Functional Status.   Therapy/Group: Individual Therapy  [name] 01/08/2017, 8:52 AM

## 2017-01-08 NOTE — Progress Notes (Signed)
Physical Therapy Note  Patient Details  Name: RUDENE ANSELL MRN: [id number] Date of Birth: [birth year] Today's Date: 01/08/2017    Time: 1450-1515 25 minutes  1:1 No c/o pain, pt c/o fatigue from previous OT session. Pt agreeable to supine therex with encouragement.  Pt performed 2 x 15 glute squeeze, SLR, SAQ, quad sets, LAQ. Pt educated on importance of continuing with HEP at home, pt/family express understanding.   Irbin Fines 01/08/2017, 3:17 PM

## 2017-01-09 ENCOUNTER — Inpatient Hospital Stay (HOSPITAL_COMMUNITY): Payer: Medicare Other | Admitting: Physical Therapy

## 2017-01-09 ENCOUNTER — Inpatient Hospital Stay (HOSPITAL_COMMUNITY): Payer: Medicare Other

## 2017-01-09 ENCOUNTER — Inpatient Hospital Stay (HOSPITAL_COMMUNITY): Payer: Medicare Other | Admitting: Occupational Therapy

## 2017-01-09 NOTE — Progress Notes (Signed)
Orthopedic Tech Progress Note Patient Details:  MARION COXE 1928-02-11 [id number]  Patient ID: [name], female   DOB: [birth year], 83 y.o.   MRN: [id number]   [name] 01/09/2017, 9:21 AM Called in advanced brace order; spoke with [facility]

## 2017-01-09 NOTE — Progress Notes (Signed)
Physical Therapy Session Note  Patient Details  Name: [name] MRN: [id number] Date of Birth: Apr 21, 1928  Today's Date: 01/09/2017 PT Individual Time: 1000-1100 PT Individual Time Calculation (min): 60 min   Short Term Goals: Week 2:  PT Short Term Goal 1 (Week 2): =LTG due to estimated LOS  Skilled Therapeutic Interventions/Progress Updates: Pt received seated in w/c, c/o pain as below and agreeable to treatment. Sit >stand at sink x2 trials with modA, standing tolerance 10-20 seconds. Sit <>stand in parallel bars x7 trials total; modA>minA with repetition. In standing progressed static standing balance>5 mini squats>10 mini squats with min guard. Cues for RUE reaching back to w/c prior to sitting. Transfer w/c <>couch with transfer board and setupA provided by son with pt directing needs to assist during transfer. UE ergometer x6 min total for UE strengthening, ROM and endurance. Returned to room w/c propulsion x100' with BUE and S before fatigued. Remained seated in w/c at end of session, all needs in reach.      Therapy Documentation Precautions:  Precautions Precautions: Fall, Other (comment) Precaution Comments: pt not to push or pull with Lt elbow more than 5 lbs (s/p total elbow replacement)  Restrictions Weight Bearing Restrictions: Yes RLE Weight Bearing: Non weight bearing Pain: Pain Assessment Pain Assessment: 0-10 Pain Score: 6  Pain Type: Surgical pain Pain Location: Leg Pain Orientation: Right Pain Descriptors / Indicators: Aching Pain Frequency: Intermittent Pain Onset: On-going Patients Stated Pain Goal: 2 Pain Intervention(s): Medication (See eMAR);Repositioned Multiple Pain Sites: No   See Function Navigator for Current Functional Status.   Therapy/Group: Individual Therapy  [name] 01/09/2017, 3:05 PM

## 2017-01-09 NOTE — Progress Notes (Signed)
Physical Therapy Session Note  Patient Details  Name: [name] MRN: [id number] Date of Birth: 11/19/1927  Today's Date: 01/09/2017 PT Individual Time: 1635-1700 PT Individual Time Calculation (min): 25 min   Short Term Goals: Week 1:  PT Short Term Goal 1 (Week 1): Pt will transfer w/c <>bed minA PT Short Term Goal 1 - Progress (Week 1): Met PT Short Term Goal 2 (Week 1): Pt will perform sit <>stand modA PT Short Term Goal 2 - Progress (Week 1): Met PT Short Term Goal 3 (Week 1): Pt will perform standing tolerance 2 min with minA PT Short Term Goal 3 - Progress (Week 1): Progressing toward goal PT Short Term Goal 4 (Week 1): Pt will initiate transfer training to/from car PT Short Term Goal 4 - Progress (Week 1): Met PT Short Term Goal 5 (Week 1): Pt will perform bed mobility with S PT Short Term Goal 5 - Progress (Week 1): Met Week 2:  PT Short Term Goal 1 (Week 2): =LTG due to estimated LOS  Skilled Therapeutic Interventions/Progress Updates:   Pt received sitting in recliner, asleep, but easily aroused. Pt agreeable to PT ,but reports that she is exhausted.  PT instructed pt Seated therex with level 2 tband. Hip abduction x12, reciprocal marching x 12, LAQ with banc on LLE, AROM on RLE x 12, HS curls x 12 LLE, Ankle PF x 15LLE, min cues from PT for improved ROM and decreased speed with eccentric portion of movement to improved strengthening aspect. Pt left sitting in recliner with call bell in reach and all needs met.      Therapy Documentation Precautions:  Precautions Precautions: Fall, Other (comment) Precaution Comments: pt not to push or pull with Lt elbow more than 5 lbs (s/p total elbow replacement)  Restrictions Weight Bearing Restrictions: Yes RLE Weight Bearing: Non weight bearing   Vital Signs: Therapy Vitals Temp: 98.1 F (36.7 C) Temp Source: Oral Pulse Rate: 73 Resp: 17 BP: (!) 138/50 Patient Position (if appropriate): Sitting Oxygen  Therapy SpO2: 96 % O2 Device: Not Delivered Pain: Pain Assessment Pain Assessment: 0-10 Pain Score: 5  Pain Type: Surgical pain Pain Location: Leg Pain Orientation: Right Pain Descriptors / Indicators: Aching Pain Frequency: Intermittent Pain Onset: On-going Patients Stated Pain Goal: 2 Pain Intervention(s): Medication (See eMAR);Repositioned Multiple Pain Sites: No   See Function Navigator for Current Functional Status.   Therapy/Group: Individual Therapy   E  01/09/2017, 5:51 PM  

## 2017-01-09 NOTE — Progress Notes (Signed)
Occupational Therapy Session Note  Patient Details  Name: [name] MRN: [id number] Date of Birth: 21-Jun-1928  Today's Date: 01/09/2017 OT Individual Time: 3546-5681 OT Individual Time Calculation (min): 45 min    Short Term Goals: Week 2:  OT Short Term Goal 1 (Week 2): STGs = LTGs  Skilled Therapeutic Interventions/Progress Updates:    Pt seen for family education with pt's daughter and ADL retraining to focus on shower transfers and adaptive techniques.  Pt was able to use a partial A/P transfer onto tub transfer bench with S. Dtr stated they can use the same set up at home. Bathed with close s.  From w/c, worked on dressing. She was able to laterally lean to pull pants up thighs and then needed to stand with max A to have her dtr assist with fully pulling pants over hips.  Reviewed DME needs of drop arm BSC and tub bench. dtr still deciding whether to order one through [facility] or to get one herself. Pt has been making good progress with her activity tolerance and strength during transfers.  Pt in room with all needs met.   Therapy Documentation Precautions:  Precautions Precautions: Fall, Other (comment) Precaution Comments: pt not to push or pull with Lt elbow more than 5 lbs (s/p total elbow replacement)  Restrictions Weight Bearing Restrictions: Yes RLE Weight Bearing: Non weight bearing     Pain: Pain Assessment Pain Assessment: 0-10 Pain Score: 3  Pain Type: Surgical pain Pain Location: Leg Pain Orientation: Right Pain Descriptors / Indicators: Aching Pain Intervention(s): Shower ADL: ADL ADL Comments: refer to functional navigator  See Function Navigator for Current Functional Status.   Therapy/Group: Individual Therapy  SAGUIER,JULIA 01/09/2017, 10:38 AM

## 2017-01-09 NOTE — Progress Notes (Signed)
[place] PHYSICAL MEDICINE & REHABILITATION     PROGRESS NOTE    Subjective/Complaints: Still doesn't like the bed/mattress but sleeping better. Pain better controlled  ROS: pt denies nausea, vomiting, diarrhea, cough, shortness of breath or chest pain        Objective: Vital Signs: Blood pressure (!) 176/55, pulse 76, temperature 98.2 F (36.8 C), temperature source Oral, resp. rate 16, height 5\' 2"  (1.575 m), weight 54.4 kg (119 lb 14.9 oz), SpO2 95 %. No results found. No results for input(s): WBC, HGB, HCT, PLT in the last 72 hours. No results for input(s): NA, K, CL, GLUCOSE, BUN, CREATININE, CALCIUM in the last 72 hours.  Invalid input(s): CO CBG (last 3)  No results for input(s): GLUCAP in the last 72 hours.  Wt Readings from Last 3 Encounters:  01/09/17 54.4 kg (119 lb 14.9 oz)  12/30/16 48.2 kg (106 lb 3.2 oz)  12/01/16 58.2 kg (128 lb 6.4 oz)    Physical Exam:  HENT: oral mucosa pink Head: Normocephalic.  Eyes: EOMI.  Neck: Normal range of motion. Neck supple. No thyromegalypresent.  Cardiovascular: RRR Respiratory: CTA B GI: Soft. Bowel sounds are normal. She exhibits no distension. There is no tenderness.  Psychiatric: She has a normal mood and affect. Her behavior is normal.  Skin.     2/ 21/18 photo: similar appearance today      Sacral area with redness and excoriation.  -still somewhat tender Musculoskeletal. Arthritic changes in both hands. Right bk stump swollen. Neurological: She remains oriented to person, place, and time. No cranial nerve deficit.   Intrinsic minus left foot. Stocking glove sensory loss up to leftknee and to a lesser extent in the hands. UE remains 4+ to 5/5 with some weakness in both hands. LLE grossly 4/5 proximal to distal.  RHF 3/5.  Psych: pleasant and appropriate.  Assessment/Plan: 1. Functional deficits secondary to right BKA which require 3+ hours per day of interdisciplinary therapy in a comprehensive  inpatient rehab setting. Physiatrist is providing close team supervision and 24 hour management of active medical problems listed below. Physiatrist and rehab team continue to assess barriers to discharge/monitor patient progress toward functional and medical goals.  Function:  Bathing Bathing position   Position: Shower  Bathing parts Body parts bathed by patient: Right arm, Left arm, Chest, Abdomen, Front perineal area, Right upper leg, Left upper leg, Buttocks, Left lower leg Body parts bathed by helper: Back  Bathing assist Assist Level: Touching or steadying assistance(Pt > 75%)      Upper Body Dressing/Undressing Upper body dressing   What is the patient wearing?: Pull over shirt/dress     Pull over shirt/dress - Perfomed by patient: Thread/unthread left sleeve, Pull shirt over trunk, Put head through opening, Thread/unthread right sleeve Pull over shirt/dress - Perfomed by helper: Put head through opening        Upper body assist Assist Level: Set up   Set up : To obtain clothing/put away  Lower Body Dressing/Undressing Lower body dressing   What is the patient wearing?: Pants     Pants- Performed by patient: Thread/unthread left pants leg Pants- Performed by helper: Thread/unthread right pants leg, Pull pants up/down   Non-skid slipper socks- Performed by helper: Don/doff left sock     Shoes - Performed by patient: Don/doff left shoe            Lower body assist Assist for lower body dressing: Touching or steadying assistance (Pt > 75%) (modA)  Toileting Toileting   Toileting steps completed by patient: Performs perineal hygiene Toileting steps completed by helper: Adjust clothing prior to toileting, Performs perineal hygiene, Adjust clothing after toileting Toileting Assistive Devices: Grab bar or rail  Toileting assist Assist level: Touching or steadying assistance (Pt.75%)   Transfers Chair/bed transfer   Chair/bed transfer method: Lateral  scoot Chair/bed transfer assist level: Supervision or verbal cues Chair/bed transfer assistive device: Sliding board, Armrests     Locomotion Ambulation Ambulation activity did not occur: Safety/medical concerns         Wheelchair   Type: Manual Max wheelchair distance: 100 Assist Level: Supervision or verbal cues  Cognition Comprehension Comprehension assist level: Understands complex 90% of the time/cues 10% of the time  Expression Expression assist level: Expresses complex 90% of the time/cues < 10% of the time  Social Interaction Social Interaction assist level: Interacts appropriately with others with medication or extra time (anti-anxiety, antidepressant).  Problem Solving Problem solving assist level: Solves basic problems with no assist  Memory Memory assist level: Recognizes or recalls 90% of the time/requires cueing < 10% of the time   Medical Problem List and Plan: 1. Decreased functional mobilitysecondary to right BKA 12/23/2016 -continue CIR therapies-  2. DVT Prophylaxis/Anticoagulation: Sq lovenox 3. Pain Management/fibromyalgia: Neurontin 600 mg 3 times a day----has used this long term at home for neuropathic pain in legs---decreased on 2/15 to only 300mg  qhs  -continue Duragesic patch 12.5 g, oxycodone as needed  -continue robaxin for spasm  -continue prn low dose oral dilaudid for severe pain    4. Mood: Valium 2.5 mg daily at bedtime as needed anxiety 5. Neuropsych: This patient iscapable of making decisions on herown behalf. 6. Skin/Wound Care: wound stable to improved. Some of incision margin may actually be viable  -continue oil emersion dressing ,gauze,kerlix dressing daily with ACE  -may be able to switch to shrinker next week   -continue air mattress to allow improvement of skin in sacral area   -keep area dry/gerhardt's cream 7. Fluids/Electrolytes/Nutrition: y  -appreciate RD assist  -appetite showing some improvement  -recheck  bmet on monday  8.Acute blood loss anemia. Follow-up CBC with hgb back up to 8.2  -continue to follow clnically---  -recheck cbc monday 9.Atrial fibrillation. Lopressor 50 mg daily and 25 mg daily at bedtime. Cardiac rate control. Follow-up per cardiology services 10.Hypertension. Avapro 75 mg daily, HCTZ 6.25 mg daily. Reasonable control at present 11.CRI stage III. Creatinine baseline 1.07-1.19. Cr 0.94   12.History of GI bleed. Continue Protonix 13.Constipation. Laxative assistance 15. Hx of right elbow replacement 2015---5 lb weight limit through elbow   LOS (Days) 10 A FACE TO FACE EVALUATION WAS PERFORMED  Meredith Staggers, MD 01/09/2017 8:47 AM

## 2017-01-09 NOTE — Progress Notes (Signed)
Occupational Therapy Note  Patient Details  Name: [name] MRN: [id number] Date of Birth: [birth year]  Today's Date: 01/09/2017 OT Individual Time: 1130-1200 OT Individual Time Calculation (min): 30 min   Pt denied pain Individual therapy  Pt resting in w/c upon arrival with son present.  Pt transitioned to therapy gym and practiced sliding board transfers and squat pivot transfers.  Pt performed SBT X 3 (supervision after setup X 2, and supervision including setup X 1). Pt performed squat pivot transfers X 2 with steady A.  Pt returned to room and remained in w/c with son present.    Leotis Shames [facility] 01/09/2017, 12:07 PM

## 2017-01-09 NOTE — Progress Notes (Signed)
Physical Therapy Session Note  Patient Details  Name: [name] MRN: [id number] Date of Birth: June 29, 1928  Today's Date: 01/09/2017 PT Individual Time: [id number] PT Individual Time Calculation (min): 42 min   Short Term Goals: Week 2:  PT Short Term Goal 1 (Week 2): =LTG due to estimated LOS  Skilled Therapeutic Interventions/Progress Updates:    Pt up in chair upon arrival. Reports being tired but willing to participate. With encouragement pt propelling self to gym in w/c. Slide board transfer from w/c to mat with min guard assist. On mat, performing Rt LE BKA exercises including QS, SLR, hip abd/add, hip extension, SAQ (resistance applied as appropriate). Transfers from mat<>w/c performed without SB using squat pivot technique. Reminder to pt to decrease use of Lt UE for transfers and positioning. Reinforced precaution as well as knee extension. Pt returned to room, up in w/c with family. Patient denies any questions or concerns.    Therapy Documentation Precautions:  Precautions Precautions: Fall, Other (comment) Precaution Comments: pt not to push or pull with Lt elbow more than 5 lbs (s/p total elbow replacement)  Restrictions Weight Bearing Restrictions: Yes RLE Weight Bearing: Non weight bearing   Pain: Pt reports pain as 3/10 with sharp, shooting pain in Rt LE and occasional phantom pain as well.     See Function Navigator for Current Functional Status.   Therapy/Group: Individual Therapy  Linard Millers, PT 01/09/2017, 4:28 PM

## 2017-01-10 ENCOUNTER — Inpatient Hospital Stay (HOSPITAL_COMMUNITY): Payer: Medicare Other | Admitting: Occupational Therapy

## 2017-01-10 MED ORDER — IRBESARTAN 300 MG PO TABS
150.0000 mg | ORAL_TABLET | Freq: Every day | ORAL | Status: DC
  Administered 2017-01-10: 150 mg via ORAL
  Filled 2017-01-10 (×2): qty 1

## 2017-01-10 NOTE — Progress Notes (Signed)
  [place] PHYSICAL MEDICINE & REHABILITATION     PROGRESS NOTE    Subjective/Complaints: She feels well. Has no complaints. She is on with the nurses.  Objective: Vital Signs: Blood pressure (!) 178/56, pulse 70, temperature 98 F (36.7 C), temperature source Oral, resp. rate 16, height 5\' 2"  (1.575 m), weight 112 lb 12.8 oz (51.2 kg), SpO2 99 %.  Elderly female in no acute distress. She is quite pleasant. HEENT exam atraumatic, normocephalic Neck is supple Chest clear to auscultation without increased work of breathing Cardiac exam S1 and S2 are irregular Abdominal exam of weight, active bowel sounds, soft. Extremities she has an IV in her right arm.  Assessment/Plan: 1. Functional deficits secondary to right BKA  Medical Problem List and Plan: 1. Decreased functional mobilitysecondary to right BKA 12/23/2016 -continue CIR therapies-  2. DVT Prophylaxis/Anticoagulation: Sq lovenox 3. Pain Management/fibromyalgia: Well controlled on 01/10/2017. Neurontin 600 mg 3 times a day----has used this long term at home for neuropathic pain --decreased on 2/15 to only 300mg  qhs  -continue Duragesic patch 12.5 g, oxycodone as needed  -continue robaxin for spasm  -continue prn low dose oral dilaudid for severe pain    4. Mood: Valium 2.5 mg daily at bedtime as needed anxiety 5. Neuropsych: This patient iscapable of making decisions on herown behalf. 6. Skin/Wound Care: wound stable to improved. Some of incision margin may actually be viable  -continue oil emersion dressing ,gauze,kerlix dressing daily with ACE  -may be able to switch to shrinker next week   -continue air mattress to allow improvement of skin in sacral area   -keep area dry/gerhardt's cream 7. Fluids/Electrolytes/Nutrition: y  -appreciate RD assist  -appetite showing some improvement  -recheck bmet on monday  8.Acute blood loss anemia. Follow-up CBC with hgb back up to 8.2  -continue to follow  clnically---  -recheck cbc monday 9.Atrial fibrillation. Lopressor 50 mg daily and 25 mg daily at bedtime. Cardiac rate control. Follow-up per cardiology services 10.Hypertension. 138/50-178/56  increae Avapro to 150mg  po qd, HCTZ 6.25 mg daily. Reasonable control at present 11.CRI stage III. Creatinine baseline 1.07-1.19. Cr 0.94   12.History of GI bleed. Continue Protonix 13.Constipation. Laxative assistance 15. Hx of right elbow replacement 2015---5 lb weight limit through elbow   LOS (Days) 11 A FACE TO FACE EVALUATION WAS PERFORMED  Lisabeth Pick, MD 01/10/2017 8:43 AM

## 2017-01-11 ENCOUNTER — Inpatient Hospital Stay (HOSPITAL_COMMUNITY): Payer: Medicare Other | Admitting: Physical Therapy

## 2017-01-11 MED ORDER — IRBESARTAN 300 MG PO TABS
300.0000 mg | ORAL_TABLET | Freq: Every day | ORAL | Status: DC
Start: 2017-01-12 — End: 2017-01-13
  Administered 2017-01-12 – 2017-01-13 (×2): 300 mg via ORAL
  Filled 2017-01-11 (×2): qty 1

## 2017-01-11 NOTE — Progress Notes (Signed)
Physical Therapy Session Note  Patient Details  Name: [name] MRN: [id number] Date of Birth: 08-13-1928  Today's Date: 01/11/2017 PT Individual Time: 1305-1400 PT Individual Time Calculation (min): 55 min   Skilled Therapeutic Interventions/Progress Updates:    Pt received in w/c & agreeable to tx. Pt noted pain in RLE & RN made aware. [name] [name]) arrived and beginning of session focused on car transfer in Cindy's SUV. Cindy had ramp & platform assembled and pt completed stand pivot w/c<>car with Cindy's assistance and cuing from PT for technique/sequencing. Pt continues to have difficulty fully sitting on seat of car & daughter plans to have platform raised 2 more inches today; [name] reports she will be here during 1PM session tomorrow to practice transfer again. Back on unit pt completed lateral scoot/squat pivot w/c<>mat table with mod assist +1. Pt requires cuing for safety and assistance with weight shifting. Pt completed sit<>stand x 3 attempts with cuing to scoot to edge of seat, anterior weight shifting and blocking at L knee to prevent buckling. Pt with poor ability to shift weight anteriorly to assist with transfer. Pt also requires max assist for transfer and for standing balance 2/2 posterior lean; pt with difficulty extending L knee and only able to stand for a maximum of ~10 seconds. Pt returned to w/c & propelled with RUE & LLE with instructional cuing and supervision overall. At end of session pt left sitting in w/c in room with family present to supervise.   Educated pt on phantom limb sensation/pain and desensitization techniques as pt reports she will occasionally feel pain in toes on R foot.  Throughout session pt requires cuing to limit use of LUE as she will attempt to propel w/c with LUE and use it to assist her with transfers.   Therapy Documentation Precautions:  Precautions Precautions: Fall, Other (comment) Precaution Comments: pt not to push or pull  with Lt elbow more than 5 lbs (s/p total elbow replacement)  Restrictions Weight Bearing Restrictions: Yes RLE Weight Bearing: Non weight bearing   See Function Navigator for Current Functional Status.   Therapy/Group: Individual Therapy  Waunita Schooner 01/11/2017, 3:42 PM

## 2017-01-11 NOTE — Progress Notes (Signed)
  [place] PHYSICAL MEDICINE & REHABILITATION     PROGRESS NOTE    Subjective/Complaints: She feels well today. Her daughters in the room with her today. She is eager to finish up therapy and go home Tuesday.  Objective: Vital Signs: Blood pressure (!) 178/57, pulse 72, temperature 97.7 F (36.5 C), temperature source Oral, resp. rate 16, height 5\' 2"  (1.575 m), weight 114 lb 12.8 oz (52.1 kg), SpO2 98 %.  Elderly female in no acute distress. She is quite pleasant. HEENT exam atraumatic, normocephalic Neck is supple Chest clear to auscultation without increased work of breathing Cardiac exam S1 and S2 are irregular Abdominal exam of weight, active bowel sounds, soft.   No edema  Assessment/Plan: 1. Functional deficits secondary to right BKA  Medical Problem List and Plan: 1. Decreased functional mobilitysecondary to right BKA 12/23/2016 -continue CIR therapies-  2. DVT Prophylaxis/Anticoagulation: Sq lovenox 3. Pain Management/fibromyalgia: Well controlled on 01/11/2017.  --decreased on 2/15 to only 300mg  qhs  -continue Duragesic patch 12.5 g, oxycodone as needed  -continue robaxin for spasm  -continue prn low dose oral dilaudid for severe pain    4. Mood: Valium 2.5 mg daily at bedtime as needed anxiety 5. Neuropsych: This patient iscapable of making decisions on herown behalf. 6. Skin/Wound Care: wound stable to improved. Some of incision margin may actually be viable  -continue oil emersion dressing ,gauze,kerlix dressing daily with ACE  -may be able to switch to shrinker next week   -continue air mattress to allow improvement of skin in sacral area   -keep area dry/gerhardt's cream 7. Fluids/Electrolytes/Nutrition: y  -appreciate RD assist  -appetite showing some improvement  -recheck bmet on monday  8.Acute blood loss anemia. Follow-up CBC with hgb back up to 8.2  -continue to follow clnically---  -recheck cbc monday 9.Atrial fibrillation.  Lopressor 50 mg daily and 25 mg daily at bedtime. Cardiac rate control. Follow-up per cardiology services 10.Hypertension. 128/43-178/57  increae Avapro to 300 mg po qd, HCTZ 6.25 mg daily.  11.CRI stage III. Creatinine baseline 1.07-1.19. Cr 0.94  - will need to monitor on higher dose of ARB 12.History of GI bleed. Continue Protonix 13.Constipation. Laxative assistance 15. Hx of right elbow replacement 2015---5 lb weight limit through elbow   LOS (Days) 12 A FACE TO FACE EVALUATION WAS PERFORMED  Lisabeth Pick, MD 01/11/2017 8:20 AM

## 2017-01-11 NOTE — Therapy (Signed)
Late entry on 2/25 for 2/24 Occupational Therapy treatement 8:30-9:30 am Total individulal minutes = 60  Patient participated as follows:     W/c to/from recliner via side scoot with min A.     She attempted stand/pivot but stated, "I don't think I can do it.   I am a little afraid.  I would rather scoott" and resorted to sit back down.  When this clinician suggested she try again since after home, she will have to stand/squat pivot w/c to/fr her recliner, she stated she did not want to do so today.  She stated her daughter will home to help her.  Patient was able to complete sit to stand at sink for about 3 seconds x 2 before stating she wanted to sit back down.  She groomed her dentures, face and hands with assist to obtain items.   Patient was left in her w/c beside her bed with call bell and phone within reach.

## 2017-01-12 ENCOUNTER — Inpatient Hospital Stay (HOSPITAL_COMMUNITY): Payer: Medicare Other | Admitting: Occupational Therapy

## 2017-01-12 ENCOUNTER — Ambulatory Visit: Payer: Medicare Other | Admitting: Surgery

## 2017-01-12 ENCOUNTER — Ambulatory Visit (HOSPITAL_COMMUNITY): Payer: Medicare Other | Admitting: Physical Therapy

## 2017-01-12 ENCOUNTER — Other Ambulatory Visit (HOSPITAL_COMMUNITY): Payer: Medicare Other

## 2017-01-12 ENCOUNTER — Encounter (HOSPITAL_COMMUNITY): Payer: Medicare Other

## 2017-01-12 ENCOUNTER — Inpatient Hospital Stay (HOSPITAL_COMMUNITY): Payer: Medicare Other | Admitting: Physical Therapy

## 2017-01-12 LAB — BASIC METABOLIC PANEL
Anion gap: 9 (ref 5–15)
BUN: 15 mg/dL (ref 6–20)
CO2: 25 mmol/L (ref 22–32)
Calcium: 9.5 mg/dL (ref 8.9–10.3)
Chloride: 101 mmol/L (ref 101–111)
Creatinine, Ser: 1 mg/dL (ref 0.44–1.00)
GFR calc Af Amer: 57 mL/min — ABNORMAL LOW (ref >60)
GFR calc non Af Amer: 50 mL/min — ABNORMAL LOW (ref >60)
Glucose, Bld: 96 mg/dL (ref 65–99)
POTASSIUM: 3.3 mmol/L — AB (ref 3.5–5.1)
SODIUM: 135 mmol/L (ref 135–145)

## 2017-01-12 LAB — CBC
HCT: 29.9 % — ABNORMAL LOW (ref 36.0–46.0)
HEMOGLOBIN: 9.5 g/dL — AB (ref 12.0–15.0)
MCH: 28.9 pg (ref 26.0–34.0)
MCHC: 31.8 g/dL (ref 30.0–36.0)
MCV: 90.9 fL (ref 78.0–100.0)
Platelets: 280 10*3/uL (ref 150–400)
RBC: 3.29 MIL/uL — AB (ref 3.87–5.11)
RDW: 15.8 % — ABNORMAL HIGH (ref 11.5–15.5)
WBC: 6.1 10*3/uL (ref 4.0–10.5)

## 2017-01-12 MED ORDER — POTASSIUM CHLORIDE CRYS ER 10 MEQ PO TBCR
10.0000 meq | EXTENDED_RELEASE_TABLET | Freq: Every day | ORAL | Status: DC
  Administered 2017-01-12 – 2017-01-13 (×2): 10 meq via ORAL
  Filled 2017-01-12 (×2): qty 1

## 2017-01-12 NOTE — Progress Notes (Signed)
Occupational Therapy Session Note  Patient Details  Name: [name] MRN: [id number] Date of Birth: [birth year]  Today's Date: 01/12/2017 OT Individual Time: 1045-1200 OT Individual Time Calculation (min): 75 min    Short Term Goals: Week 2:  OT Short Term Goal 1 (Week 2): STGs = LTGs  Skilled Therapeutic Interventions/Progress Updates:    Pt seen for OT ADL bathing/dressing session. Pt sitting up in w/c upon arrival, voicing having an "off day", though didn't elaborate and was willing to cont with therapy session. She showered on tub transfer bench, completing transfer with close supervision with min VCs for technique. She bathed seated on tub bench, assist for L LE. She transferred out of shower and dressed seated in chair. Returned to bed for sacral bandage to be donned. Squat/ scoot pivots completed with min A and VCs for technique. Educated regarding lateral leans to pull pants up completely. She transitioned back into chair in same manner as described above. She Completed x2 standing trials at sink, mod A to stand and use of B UEs for balance support. Tolerated ~10 seconds static standing before requiring seated rest break.  Pt left seated in w/c at end of session, all needs in reach.  Educated throughout session regarding DME and continuum of care for follow up therapies.    Therapy Documentation Precautions:  Precautions Precautions: Fall, Other (comment) Precaution Comments: pt not to push or pull with Lt elbow more than 5 lbs (s/p total elbow replacement)  Restrictions Weight Bearing Restrictions: Yes RLE Weight Bearing: Non weight bearing ADL: ADL ADL Comments: refer to functional navigator  See Function Navigator for Current Functional Status.   Therapy/Group: Individual Therapy  Lewis, Camyah Pultz C 01/12/2017, 7:22 AM

## 2017-01-12 NOTE — Progress Notes (Signed)
Physical Therapy Discharge Summary  Patient Details  Name: [name] MRN: [id number] Date of Birth: 06-11-1928  Today's Date: 01/13/2017 PT Individual Time: 1000-1045 PT Individual Time Calculation (min): 45 min    Patient has met 6 of 7 long term goals due to improved activity tolerance, improved balance, improved postural control, increased strength and decreased pain.  Patient to discharge at a wheelchair level [place].   Patient's care partner is independent to provide the necessary physical assistance at discharge.  Reasons goals not met: Car transfer goal unmet however adequate for d/c; daughter with high seat heigh in SUV and daughter able to provide minA for car transfer.   Recommendation:  Patient will benefit from ongoing skilled PT services in home health setting to continue to advance safe functional mobility, address ongoing impairments in strength, balance, coordination, ROM, and minimize fall risk.  Equipment: 16x16 w/c, transfer board  Reasons for discharge: treatment goals met and discharge from hospital  Patient/family agrees with progress made and goals achieved: Yes  PT Discharge Precautions/Restrictions Precautions Precautions: Fall;Other (comment) Precaution Comments: pt not to push or pull with Lt elbow more than 5 lbs (s/p total elbow replacement)  Restrictions Weight Bearing Restrictions: Yes RLE Weight Bearing: Non weight bearing Vital Signs Therapy Vitals Temp: 97.8 F (36.6 C) Temp Source: Oral Pulse Rate: 77 Resp: 16 BP: (!) 128/54 Patient Position (if appropriate): Sitting Oxygen Therapy SpO2: 100 % O2 Device: Not Delivered Vision/Perception    WFL; wears glasses, no change from baseline Cognition Overall Cognitive Status: Within Functional Limits for tasks assessed Arousal/Alertness: Awake/alert Orientation Level: Oriented X4 Memory: Appears intact Problem Solving: Appears intact Safety/Judgment: Appears  intact Sensation Sensation Stereognosis: Appears Intact Hot/Cold: Appears Intact Proprioception: Appears Intact Coordination Gross Motor Movements are Fluid and Coordinated: No Fine Motor Movements are Fluid and Coordinated: Yes Coordination and Movement Description: Impaired due to generalized weakness and new BKA Motor  Motor Motor - Discharge Observations: Generalized weakness  Mobility Bed Mobility Bed Mobility: Supine to Sit;Sit to Supine Supine to Sit: 6: Modified independent (Device/Increase time) Sit to Supine: 6: Modified independent (Device/Increase time) Transfers Transfers: Yes Sit to Stand: 4: Min assist (parallel bars) Sit to Stand Details: Verbal cues for sequencing;Verbal cues for technique;Tactile cues for weight shifting;Tactile cues for posture;Manual facilitation for weight shifting Stand to Sit: 4: Min assist;With upper extremity assist;With armrests Stand to Sit Details (indicate cue type and reason): Verbal cues for technique;Tactile cues for weight shifting;Verbal cues for precautions/safety;Tactile cues for sequencing Squat Pivot Transfers: With armrests;4: Min assist Squat Pivot Transfer Details: Verbal cues for technique;Tactile cues for weight shifting;Tactile cues for placement Lateral/Scoot Transfers: 5: Supervision;With slide board Lateral/Scoot Transfer Details: Verbal cues for technique;Verbal cues for sequencing Locomotion  Ambulation Ambulation: No Gait Gait: No Stairs / Additional Locomotion Stairs: No Wheelchair Mobility Wheelchair Mobility: Yes Wheelchair Assistance: 5: Careers information officer: Both upper extremities Wheelchair Parts Management: Supervision/cueing Distance: 150'  Trunk/Postural Assessment  Cervical Assessment Cervical Assessment: Within Functional Limits Thoracic Assessment Thoracic Assessment: Within Functional Limits Lumbar Assessment Lumbar Assessment: Exceptions to [facility] (Posterior pelvic tilt) Postural  Control Postural Control: Within Functional Limits  Balance Balance Balance Assessed: Yes Static Sitting Balance Static Sitting - Balance Support: No upper extremity supported Static Sitting - Level of Assistance: 6: Modified independent (Device/Increase time) Dynamic Sitting Balance Dynamic Sitting - Balance Support: No upper extremity supported;Feet supported;During functional activity Dynamic Sitting - Level of Assistance: 5: Stand by assistance Sitting balance - Comments: Sitting to complete bathing task  Static Standing Balance Static Standing - Balance Support: Bilateral upper extremity supported Static Standing - Level of Assistance: 5: Stand by assistance;4: Min assist Static Standing - Comment/# of Minutes: Standing at sink ledge Dynamic Standing Balance Dynamic Standing - Balance Support: Bilateral upper extremity supported Dynamic Standing - Level of Assistance: 4: Min assist Dynamic Standing - Comments: Standing at sink ledge Extremity Assessment  RUE Assessment RUE Assessment: Exceptions to [facility] RUE AROM (degrees) RUE Overall AROM Comments: R shoulder AROM limited to 50- 60 degrees from fx in 2014 LUE Assessment LUE Assessment: Exceptions to WFL LUE AROM (degrees) LUE Overall AROM Comments: WFL, no weight bearing through elbow of more than 5 lbs from total elbow arthroplasty RLE Assessment RLE Assessment: Exceptions to [facility] (4-/5 hip, 4-/5 knee flexion/extension limited d/t pain, ankle N/A, hamstring ROM WFL) LLE Assessment LLE Assessment: Within Functional Limits (grossly 4+/5 throughout)   Skilled Therapeutic Intervention: Pt received seated in w/c with daughter present; waiting on DME to arrive to room prior to d/c. Reviewed d/c instructions; provided coband to daughter to allow the to elevate amputee pad at home for pt comfort as was done in rehab. Equipment arrived, and therapist reviewed features of w/c, storage for transport. Car transfer with daughter providing  minA; therapist available for assist/cueing however not needed. Pt d/c to home with daughter at end of session; missed 15 min due to not requiring entire scheduled session for car transfer.    See Function Navigator for Current Functional Status.  Benjiman Core Tygielski 01/12/2017, 3:52 PM

## 2017-01-12 NOTE — Progress Notes (Signed)
Orthopedic Tech Progress Note Patient Details:  [name] October 02, 1928 [id number]  Patient ID: [name], female   DOB: August 12, 1928, 83 y.o.   MRN: [id number]   [name] 01/12/2017, 1:31 PM Called in advanced brace order; spoke with [facility]

## 2017-01-12 NOTE — Discharge Summary (Signed)
Discharge summary job # [id number]

## 2017-01-12 NOTE — Progress Notes (Signed)
Physical Therapy Session Note  Patient Details  Name: [name] MRN: [id number] Date of Birth: Apr 22, 1928  Today's Date: 01/12/2017 PT Individual Time: [id number] PT Individual Time Calculation (min): 58 min   Short Term Goals: Week 2:  PT Short Term Goal 1 (Week 2): =LTG due to estimated LOS  Skilled Therapeutic Interventions/Progress Updates:  Pt received in w/c & agreeable to tx. Pt reported pain in RLE but did not rate & pain meds requested. Session focused on w/c mobility, transfers, pt education, and LE strengthening. In day room pt completed squat pivot and slide board transfers w/c<>low, compliant couch. Pt requires mod assist for squat pivot and steady/min assist for slide board transfers. Therapist provides cuing to not use of LUE and manual facilitation for weight shifting. Pt able to set up w/c and slide board with min cuing and assist for managing L leg rest. Pt propelled w/c with RUE & LLE x 120 ft with more than reasonable amount of time. Educated pt on shrinker sock, purpose of sock, and wear time. In gym pt engaged in LLE strengthening exercises consisting of: hamstring curls with orange theraband, RLE quad sets, and LLE long arc quads with 3 second hold & 2 lbs weight. At end of session pt left sitting in w/c in room with all needs within reach.  Pt requires frequent rest breaks 2/2 fatigue.      Therapy Documentation Precautions:  Precautions Precautions: Fall, Other (comment) Precaution Comments: pt not to push or pull with Lt elbow more than 5 lbs (s/p total elbow replacement)  Restrictions Weight Bearing Restrictions: Yes RLE Weight Bearing: Non weight bearing   See Function Navigator for Current Functional Status.   Therapy/Group: Individual Therapy  Waunita Schooner 01/12/2017, 10:01 AM

## 2017-01-12 NOTE — Progress Notes (Signed)
Social Work Patient ID: [name], female   DOB: [birth year], 83 y.o.   MRN: [id number]   Met with pt and daughter earlier today to review HH and DME referrals.  Both report they are feeling ready for d/c tomorrow.  Iran Kievit, LCSW

## 2017-01-12 NOTE — Progress Notes (Signed)
Physical Therapy Session Note  Patient Details  Name: [name] MRN: [id number] Date of Birth: December 16, 1927  Today's Date: 01/12/2017 PT Individual Time: 1300-1400 PT Individual Time Calculation (min): 60 min   Short Term Goals: Week 2:  PT Short Term Goal 1 (Week 2): =LTG due to estimated LOS  Skilled Therapeutic Interventions/Progress Updates: Pt received seated in w/c, denies pain and agreeable to treatment. Performed w/c mobility with S x100'. Lateral scoot transfer w/c <>mat table with transfer board and S provided by daughter. Sit <>supine with modI on mat table. Performed squat pivot transfer w/c <>mat with daughter providing minA as they plan to perform at home to [facility]. Sit <>stand and mini-squats in parallel bars x4 trials; minA for boosting to stand, close S for static standing balance. Car transfer performed stand pivot to 27" height seat (daugters car seat height 31" but built 4" platform for transfers); minA provided by daughter. Daughter expressed concerns about getting into car tomorrow; plan for day of d/c session to assist pt/daughter with car transfer and ensure safety prior to d/c home. Daughter reports platform is going to be modified to elevate 6", making seat height equivalent of 25", however it will not be completed by d/c tomorrow. Reviewed recommendation for LE ROM/strength HEP. Remained seated in w/c at end of session, all needs in reach.      Therapy Documentation Precautions:  Precautions Precautions: Fall, Other (comment) Precaution Comments: pt not to push or pull with Lt elbow more than 5 lbs (s/p total elbow replacement)  Restrictions Weight Bearing Restrictions: Yes RLE Weight Bearing: Non weight bearing   See Function Navigator for Current Functional Status.   Therapy/Group: Individual Therapy  [name] 01/12/2017, 3:56 PM

## 2017-01-12 NOTE — Discharge Instructions (Signed)
Inpatient Rehab Discharge Instructions  [name] Discharge date and time: No discharge date for patient encounter.   Activities/Precautions/ Functional Status: Activity: activity as tolerated Diet: regular diet Wound Care: keep wound clean and dry Functional status:  ___ No restrictions     ___ Walk up steps independently ___ 24/7 supervision/assistance   ___ Walk up steps with assistance ___ Intermittent supervision/assistance  ___ Bathe/dress independently ___ Walk with walker     _x__ Bathe/dress with assistance ___ Walk Independently    ___ Shower independently ___ Walk with assistance    ___ Shower with assistance ___ No alcohol     ___ Return to work/school ________    COMMUNITY REFERRALS UPON DISCHARGE:    Home Health:   PT     OT    RN                       Agency:  [place] Phone: [phone]    Medical Equipment/Items Ordered:  Wheelchair, cushion, commode, tub bench and transfer board                                                       Agency/Supplier:  [place] @ [phone]     GENERAL COMMUNITY RESOURCES FOR PATIENT/FAMILY:  Support Groups:  Amputee Support Group        Special Instructions: Gearhart butt cream twice a day to affected area of buttocks and gluteal cleft twice daily and as needed after bowel movement   My questions have been answered and I understand these instructions. I will adhere to these goals and the provided educational materials after my discharge from the hospital.  Patient/Caregiver Signature _______________________________ Date __________  Clinician Signature _______________________________________ Date __________  Please bring this form and your medication list with you to all your follow-up doctor's appointments.

## 2017-01-12 NOTE — Progress Notes (Signed)
Occupational Therapy Discharge Summary  Patient Details  Name: [name] MRN: [id number] Date of Birth: 02-Feb-1928   Patient has met 6 of 6 long term goals due to improved activity tolerance, improved balance, postural control, ability to compensate for deficits and improved coordination.  Patient to discharge at [facility] Assist level.  Patient's care partner is independent to provide the necessary physical assistance at discharge. Patient has demonstrated ability to complete dressing tasks with supervision, however, can require increased assist when fatigued or not ideal positioning. Family has been present for some OT sessions and have demonstrated ability and willingness to provide needed support.     Recommendation:  Patient will benefit from ongoing skilled OT services in home health setting to continue to advance functional skills in the area of BADL and Reduce care partner burden.  Equipment: Recommending drop arm BSC and tub transfer bench  Reasons for discharge: treatment goals met and discharge from hospital  Patient/family agrees with progress made and goals achieved: Yes  OT Discharge Precautions/Restrictions  Precautions Precautions: Fall;Other (comment) Precaution Comments: pt not to push or pull with Lt elbow more than 5 lbs (s/p total elbow replacement)  Restrictions Weight Bearing Restrictions: Yes RLE Weight Bearing: Non weight bearing ADL ADL ADL Comments: refer to functional navigator Vision/Perception  Vision- History Baseline Vision/History: Wears glasses Wears Glasses: At all times Patient Visual Report: No change from baseline  Cognition Overall Cognitive Status: Within Functional Limits for tasks assessed Arousal/Alertness: Awake/alert Orientation Level: Oriented X4 Memory: Appears intact Problem Solving: Appears intact Safety/Judgment: Appears intact Sensation Sensation Stereognosis: Appears Intact Hot/Cold: Appears  Intact Proprioception: Appears Intact Coordination Gross Motor Movements are Fluid and Coordinated: No Fine Motor Movements are Fluid and Coordinated: Yes Coordination and Movement Description: Impaired due to generalized weakness and new BKA Motor  Motor Motor - Discharge Observations: Generalized weakness Trunk/Postural Assessment  Cervical Assessment Cervical Assessment: Within Functional Limits Thoracic Assessment Thoracic Assessment: Within Functional Limits Lumbar Assessment Lumbar Assessment: Exceptions to [facility] (Posterior pelvic tilt) Postural Control Postural Control: Within Functional Limits  Balance Balance Balance Assessed: Yes Static Sitting Balance Static Sitting - Balance Support: No upper extremity supported Static Sitting - Level of Assistance: 6: Modified independent (Device/Increase time) Dynamic Sitting Balance Dynamic Sitting - Balance Support: No upper extremity supported;Feet supported;During functional activity Dynamic Sitting - Level of Assistance: 5: Stand by assistance Sitting balance - Comments: Sitting to complete bathing task Static Standing Balance Static Standing - Balance Support: Bilateral upper extremity supported Static Standing - Level of Assistance: 5: Stand by assistance;4: Min assist Static Standing - Comment/# of Minutes: Standing at sink ledge Dynamic Standing Balance Dynamic Standing - Balance Support: Bilateral upper extremity supported Dynamic Standing - Level of Assistance: 4: Min assist Dynamic Standing - Comments: Standing at sink ledge Extremity/Trunk Assessment RUE Assessment RUE Assessment: Exceptions to [facility] RUE AROM (degrees) RUE Overall AROM Comments: R shoulder AROM limited to 50- 60 degrees from fx in 2014 LUE Assessment LUE Assessment: Exceptions to WFL LUE AROM (degrees) LUE Overall AROM Comments: WFL, no weight bearing through elbow of more than 5 lbs from total elbow arthroplasty   See Function Navigator for  Current Functional Status.  Bobby Rumpf, Afomia Blackley C 01/12/2017, 3:32 PM

## 2017-01-12 NOTE — Discharge Summary (Signed)
NAMELAVERTA, [name]              ACCOUNT NO.:  [id number]  MEDICAL RECORD NO.:  [id number]  LOCATION:  [id number]                        FACILITY:  [facility]  PHYSICIAN:  Lauraine Rinne, P.A.  DATE OF BIRTH:  1928-03-25  DATE OF ADMISSION:  12/30/2016 DATE OF DISCHARGE:  01/13/2017                              DISCHARGE SUMMARY   DISCHARGE DIAGNOSES: 1. 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 2. Subcutaneous Lovenox for DVT prophylaxis. 3. Pain management. 4. Anxiety. 5. Acute blood loss anemia. 6. Atrial fibrillation. 7. Hypertension. 8. Chronic renal insufficiency, stage 3. 9. History of GI bleed. 10.Constipation, resolved. 11.History of right elbow replacement in 2015. 12. Hypokalemia  HISTORY OF PRESENT ILLNESS:  This is an 83 year old right-handed female with history of right elbow replacement, atrial fibrillation maintained on Plavix, as well as history of GI bleed, chronic back pain, fibromyalgia, hypertension, and chronic renal insufficiency stage 3, with peripheral vascular disease, and history of revascularization procedures.  She lives with her spouse, used a cane and a walker prior to admission.  Presented on December 10, 2016, with chronic right toe wound with increasing pain, ischemic changes, followed by Vascular Surgery.  No change with conservative care and underwent right transmetatarsal amputation on December 11, 2016, per [name]. [facility] course, pain management.  Subcutaneous Lovenox for DVT prophylaxis.  Acute-on-chronic anemia at 7.9 and monitored.  Progressive ischemic changes of right TMA site with nonviable tissues surrounding incision, warm to touch.  Ultimately underwent right below-knee amputation on December 23, 2016.  Plavix resumed as per Cardiology Services.  Physical and occupational therapy ongoing.  The patient was admitted for a comprehensive rehab program.  PAST MEDICAL HISTORY:  See discharge diagnoses.  SOCIAL HISTORY:  Lives  with spouse, used a cane and a walker prior to admission.  One-level home.  She has a daughter who can assist.  FUNCTIONAL STATUS:  Upon admission to rehab services was max assist, stand pivot transfers; moderate assist, sit-to-supine; minimal assist, supine-to-sit; max total assist, activities of daily living.  PHYSICAL EXAMINATION:  VITAL SIGNS:  Blood pressure 150/54, pulse 69, temperature 98, respirations 16. GENERAL:  This was an alert female, no acute distress. HEENT:  Pupils are round and reactive to light. NECK:  Supple.  Nontender.  No JVD. CARDIAC:  Rate controlled. ABDOMEN:  Soft, nontender.  Good bowel sounds. LUNGS:  Clear to auscultation.  No respiratory distress, without wheezes. EXTREMITIES:  BKA site was dressed.  Incision intact, well approximated, mild serosanguineous drainage along the right lateral aspect.  REHABILITATION HOSPITAL COURSE:  The patient was admitted to inpatient rehab services with therapies initiated on a 3-hour daily basis, consisting of physical therapy, occupational therapy, and rehabilitation nursing.  The following issues were addressed during the patient's rehabilitation stay.  Pertaining to [name]'s right BKA, December 23, 2016, she would follow up with Vascular Surgery. Order was placed for a stump shrinker.  She remained afebrile. Subcutaneous Lovenox for DVT prophylaxis throughout her rehab course. Pain management with the use of Neurontin, adjusted accordingly, currently on 300 mg daily.  Duragesic patch with Robaxin as needed for muscle spasms and low dose oxycodone.  She did have a history of anxiety, she was using  Valium at bedtime as needed.  Blood pressures remained well controlled.  Cardiac rate controlled.  History of atrial fibrillation.  She continued on Lopressor.  Her Plavix had been resumed. She would follow up Cardiology Services.  Acute blood loss anemia stable, latest hemoglobin 8.2, monitored closely, improved to  9.5. Bouts of constipation resolved with laxative assistance.  She did have some chronic renal insufficiency, baseline creatinine 1.07 to 1.19, that remained stable.  History of right elbow replacement in 2015, 5-pound weight limit through her elbow.  The patient received weekly collaborative interdisciplinary team conferences.  The patient was working with car transfers with her daughter, a ramp platform had been assembled.  She continued to have some difficulty with fully sitting on seat of car and daughter was working with platform being raised to help with this.  Required cues for safety and assistance with weight shifting.  Complete sit-to-stand with cuing to edge of bed in seat, sliding board transfers wheelchair to mat with minimal guard, transfers for mat to wheelchair performed without sliding board using squat pivot technique, when needed.  Activities of daily living and homemaking.  The patient was able to complete sit to stand at the sink for very brief time to assist with her ADLs, hygiene, and clothing, working with energy conservation techniques.  Full family teaching was completed and plan discharge to home with ongoing therapies dictated per [employer].  DISCHARGE MEDICATIONS: 1. Plavix 75 mg p.o. daily. 2. Fentanyl patch 12.5 mcg, change every 72 hours. 3. Neurontin 300 mg p.o. daily. 4. HCTZ 6.25 mg p.o. daily. 5. Avapro 300 mg p.o. daily. 6. Lopressor 50 mg p.o. daily and 25 mg Lopressor at bedtime. 7. Multivitamin daily. 8. Protonix 40 mg p.o. daily. 9. Senokot tablets b.i.d., hold for loose stools. 10.Valium 2.5 mg p.o. at bedtime as needed, anxiety. 11.oxycodone 5 mg every 4 hour as needed pain. 12. Potassium chloride 10 mEq daily  DIET:  Regular.  FOLLOWUP:  The patient will follow up with Dr. Alger Simons, at the outpatient rehab service office to be called for appointment; [name], 2 weeks Vascular Surgery; Dr. Angelena Form, Cardiology  Service, call for appointment; Dr. Marcellus Scott, Medical Management.  SPECIAL INSTRUCTIONS:  Gearhart butt cream twice daily to affected area of buttocks and gluteal cleft twice daily, and as needed after bowel movement.     Lauraine Rinne, P.A.     DA/MEDQ  D:  01/12/2017  T:  01/12/2017  Job:  [id number]  cc:   Henrine Screws, M.D. Lauree Chandler, MD V. Leia Alf, MD Meredith Staggers, M.D.

## 2017-01-12 NOTE — Progress Notes (Signed)
Subjective/Complaints:   Objective: Vital Signs: Blood pressure (!) 182/69, pulse 64, temperature 97.6 F (36.4 C), temperature source Oral, resp. rate 16, height 5' 2"  (1.575 m), weight 52.2 kg (115 lb 1.3 oz), SpO2 99 %. No results found. Results for orders placed or performed during the hospital encounter of 12/30/16 (from the past 72 hour(s))  CBC     Status: Abnormal   Collection Time: 01/12/17  3:54 AM  Result Value Ref Range   WBC 6.1 4.0 - 10.5 K/uL   RBC 3.29 (L) 3.87 - 5.11 MIL/uL   Hemoglobin 9.5 (L) 12.0 - 15.0 g/dL   HCT 29.9 (L) 36.0 - 46.0 %   MCV 90.9 78.0 - 100.0 fL   MCH 28.9 26.0 - 34.0 pg   MCHC 31.8 30.0 - 36.0 g/dL   RDW 15.8 (H) 11.5 - 15.5 %   Platelets 280 150 - 400 K/uL  Basic metabolic panel     Status: Abnormal   Collection Time: 01/12/17  3:54 AM  Result Value Ref Range   Sodium 135 135 - 145 mmol/L   Potassium 3.3 (L) 3.5 - 5.1 mmol/L   Chloride 101 101 - 111 mmol/L   CO2 25 22 - 32 mmol/L   Glucose, Bld 96 65 - 99 mg/dL   BUN 15 6 - 20 mg/dL   Creatinine, Ser 1.00 0.44 - 1.00 mg/dL   Calcium 9.5 8.9 - 10.3 mg/dL   GFR calc non Af Amer 50 (L) >60 mL/min   GFR calc Af Amer 57 (L) >60 mL/min    Comment: (NOTE) The eGFR has been calculated using the CKD EPI equation. This calculation has not been validated in all clinical situations. eGFR's persistently <60 mL/min signify possible Chronic Kidney Disease.    Anion gap 9 5 - 15     HEENT: normal Cardio: RRR Resp: CTA B/L GI: BS positive and NT, ND Extremity:  Edema R Stump Skin:   Wound unchanged in appearance since 2/21 Neuro: Alert/Oriented and Abnormal Motor RIght knee 3- pain inhibition Musc/Skel:  Other R BKA Gen NAD   Assessment/Plan: 1. Functional deficits secondary to R BKA which require 3+ hours per day of interdisciplinary therapy in a comprehensive inpatient rehab setting. Physiatrist is providing close team supervision and 24 hour management of active medical problems  listed below. Physiatrist and rehab team continue to assess barriers to discharge/monitor patient progress toward functional and medical goals. FIM: Function - Bathing Position: Shower Body parts bathed by patient: Right arm, Left arm, Chest, Abdomen, Front perineal area, Right upper leg, Left upper leg, Buttocks, Left lower leg Body parts bathed by helper: Back Bathing not applicable: Right lower leg Assist Level: Touching or steadying assistance(Pt > 75%)  Function- Upper Body Dressing/Undressing What is the patient wearing?: Pull over shirt/dress Pull over shirt/dress - Perfomed by patient: Thread/unthread left sleeve, Pull shirt over trunk, Put head through opening, Thread/unthread right sleeve Pull over shirt/dress - Perfomed by helper: Put head through opening Assist Level: Set up Set up : To obtain clothing/put away Function - Lower Body Dressing/Undressing What is the patient wearing?: Pants, Shoes Position: Wheelchair/chair at sink Pants- Performed by patient: Thread/unthread right pants leg, Thread/unthread left pants leg Pants- Performed by helper: Pull pants up/down Non-skid slipper socks- Performed by helper: Don/doff left sock Shoes - Performed by patient: Don/doff left shoe Assist for footwear: Partial/moderate assist Assist for lower body dressing: Touching or steadying assistance (Pt > 75%) (modA)  Function - Toileting Toileting steps completed by  patient: Performs perineal hygiene Toileting steps completed by helper: Adjust clothing prior to toileting, Adjust clothing after toileting Toileting Assistive Devices: Grab bar or rail Assist level: Touching or steadying assistance (Pt.75%)  Function - Toilet Transfers Toilet transfer assistive device: Bedside commode, Sliding board Assist level to toilet: Moderate assist (Pt 50 - 74%/lift or lower) Assist level from toilet: Moderate assist (Pt 50 - 74%/lift or lower) Assist level to bedside commode (at bedside):  Touching or steadying assistance (Pt > 75%) Assist level from bedside commode (at bedside): Touching or steadying assistance (Pt > 75%)  Function - Chair/bed transfer Chair/bed transfer method: Lateral scoot Chair/bed transfer assist level: Moderate assist (Pt 50 - 74%/lift or lower) Chair/bed transfer assistive device: Armrests Chair/bed transfer details: Verbal cues for precautions/safety, Verbal cues for technique  Function - Locomotion: Wheelchair Will patient use wheelchair at discharge?: Yes Type: Manual Max wheelchair distance: 150 ft Assist Level: Supervision or verbal cues Assist Level: Supervision or verbal cues Wheel 150 feet activity did not occur: Safety/medical concerns Assist Level: Supervision or verbal cues Turns around,maneuvers to table,bed, and toilet,negotiates 3% grade,maneuvers on rugs and over doorsills: No Function - Locomotion: Ambulation Ambulation activity did not occur: Safety/medical concerns Walk 10 feet activity did not occur: Safety/medical concerns Walk 50 feet with 2 turns activity did not occur: Safety/medical concerns Walk 150 feet activity did not occur: Safety/medical concerns Walk 10 feet on uneven surfaces activity did not occur: Safety/medical concerns  Function - Comprehension Comprehension: Auditory Comprehension assist level: Follows complex conversation/direction with no assist  Function - Expression Expression: Verbal Expression assist level: Expresses complex ideas: With no assist  Function - Social Interaction Social Interaction assist level: Interacts appropriately with others - No medications needed.  Function - Problem Solving Problem solving assist level: Solves complex problems: Recognizes & self-corrects  Function - Memory Memory assist level: Complete Independence: No helper Patient normally able to recall (first 3 days only): That he or she is in a hospital, Location of own room, Staff names and faces, Current  season  Medical Problem List and Plan: 1. Decreased functional mobilitysecondary to right BKA 12/23/2016 -plan d/c for am 2. DVT Prophylaxis/Anticoagulation: Sq lovenox 3. Pain Management/fibromyalgia: Neurontin 600 mg 3 times a day----has used this long term at home for neuropathic pain in legs---decreased on 2/15 to only 348m qhs  -continue Duragesic patch 12.5 g, oxycodone as needed  -continue robaxin for spasm  -continue prn low dose oral dilaudid for severe pain    4. Mood: Valium 2.5 mg daily at bedtime as needed anxiety 5. Neuropsych: This patient iscapable of making decisions on herown behalf. 6. Skin/Wound Care: wound stable to improved. Some of incision margin may actually be viable  -continue oil emersion dressing ,gauze,kerlix dressing daily with ACE  -ok for shrinker   -continue air mattress to allow improvement of skin in sacral area   -keep area dry/gerhardt's cream 7. Fluids/Electrolytes/Nutrition: y  -appreciate RD assist  -appetite showing some improvement  -recheck bmet low K, supplement  8.Acute blood loss anemia. Follow-up CBC with hgb back up to 9.5K  -continue to follow clnically---   9.Atrial fibrillation. Lopressor 50 mg daily and 25 mg daily at bedtime. Cardiac rate control. Follow-up per cardiology services 10.Hypertension. Avapro 75 mg daily, HCTZ 6.25 mg daily. Reasonable control at present 11.CRI stage III. Creatinine baseline 1.07-1.19. Cr 0.94   12.History of GI bleed. Continue Protonix 13.Constipation. Laxative assistance 15. Hx of right elbow replacement 2015---5 lb weight limit through elbow LOS (Days)  [place] EVALUATION WAS PERFORMED  Sariah Henkin E 01/12/2017, 7:30 AM

## 2017-01-13 ENCOUNTER — Encounter (HOSPITAL_COMMUNITY): Payer: Self-pay

## 2017-01-13 ENCOUNTER — Inpatient Hospital Stay (HOSPITAL_COMMUNITY)
Admission: EM | Admit: 2017-01-13 | Discharge: 2017-01-21 | DRG: 254 | Disposition: A | Discharge status: Skilled Nursing Facility | Payer: Medicare Other | Attending: Vascular Surgery | Admitting: Vascular Surgery

## 2017-01-13 ENCOUNTER — Inpatient Hospital Stay (HOSPITAL_COMMUNITY): Payer: Medicare Other | Admitting: Occupational Therapy

## 2017-01-13 ENCOUNTER — Emergency Department (HOSPITAL_COMMUNITY): Payer: Medicare Other

## 2017-01-13 ENCOUNTER — Encounter: Payer: Self-pay | Admitting: Surgery

## 2017-01-13 ENCOUNTER — Inpatient Hospital Stay (HOSPITAL_COMMUNITY): Payer: Medicare Other | Admitting: Physical Therapy

## 2017-01-13 DIAGNOSIS — M810 Age-related osteoporosis without current pathological fracture: Secondary | ICD-10-CM | POA: Diagnosis present

## 2017-01-13 DIAGNOSIS — Z96652 Presence of left artificial knee joint: Secondary | ICD-10-CM | POA: Diagnosis present

## 2017-01-13 DIAGNOSIS — Z885 Allergy status to narcotic agent status: Secondary | ICD-10-CM | POA: Diagnosis not present

## 2017-01-13 DIAGNOSIS — Z9841 Cataract extraction status, right eye: Secondary | ICD-10-CM | POA: Diagnosis not present

## 2017-01-13 DIAGNOSIS — E7800 Pure hypercholesterolemia, unspecified: Secondary | ICD-10-CM | POA: Diagnosis not present

## 2017-01-13 DIAGNOSIS — I10 Essential (primary) hypertension: Secondary | ICD-10-CM

## 2017-01-13 DIAGNOSIS — X501XXA Overexertion from prolonged static or awkward postures, initial encounter: Secondary | ICD-10-CM | POA: Diagnosis not present

## 2017-01-13 DIAGNOSIS — Z7902 Long term (current) use of antithrombotics/antiplatelets: Secondary | ICD-10-CM | POA: Diagnosis not present

## 2017-01-13 DIAGNOSIS — R8290 Unspecified abnormal findings in urine: Secondary | ICD-10-CM | POA: Diagnosis not present

## 2017-01-13 DIAGNOSIS — M79662 Pain in left lower leg: Secondary | ICD-10-CM | POA: Diagnosis not present

## 2017-01-13 DIAGNOSIS — M1990 Unspecified osteoarthritis, unspecified site: Secondary | ICD-10-CM | POA: Diagnosis present

## 2017-01-13 DIAGNOSIS — H5440 Blindness, one eye, unspecified eye: Secondary | ICD-10-CM | POA: Diagnosis not present

## 2017-01-13 DIAGNOSIS — Z961 Presence of intraocular lens: Secondary | ICD-10-CM | POA: Diagnosis present

## 2017-01-13 DIAGNOSIS — I70322 Atherosclerosis of unspecified type of bypass graft(s) of the extremities with rest pain, left leg: Secondary | ICD-10-CM | POA: Diagnosis not present

## 2017-01-13 DIAGNOSIS — I517 Cardiomegaly: Secondary | ICD-10-CM | POA: Diagnosis not present

## 2017-01-13 DIAGNOSIS — I129 Hypertensive chronic kidney disease with stage 1 through stage 4 chronic kidney disease, or unspecified chronic kidney disease: Secondary | ICD-10-CM | POA: Diagnosis present

## 2017-01-13 DIAGNOSIS — H353290 Exudative age-related macular degeneration, unspecified eye, stage unspecified: Secondary | ICD-10-CM | POA: Diagnosis present

## 2017-01-13 DIAGNOSIS — Z79899 Other long term (current) drug therapy: Secondary | ICD-10-CM | POA: Diagnosis not present

## 2017-01-13 DIAGNOSIS — Z85828 Personal history of other malignant neoplasm of skin: Secondary | ICD-10-CM | POA: Diagnosis not present

## 2017-01-13 DIAGNOSIS — D638 Anemia in other chronic diseases classified elsewhere: Secondary | ICD-10-CM | POA: Diagnosis not present

## 2017-01-13 DIAGNOSIS — R5381 Other malaise: Secondary | ICD-10-CM | POA: Diagnosis not present

## 2017-01-13 DIAGNOSIS — Z955 Presence of coronary angioplasty implant and graft: Secondary | ICD-10-CM

## 2017-01-13 DIAGNOSIS — I480 Paroxysmal atrial fibrillation: Secondary | ICD-10-CM | POA: Diagnosis not present

## 2017-01-13 DIAGNOSIS — S99922A Unspecified injury of left foot, initial encounter: Secondary | ICD-10-CM | POA: Diagnosis not present

## 2017-01-13 DIAGNOSIS — M25572 Pain in left ankle and joints of left foot: Secondary | ICD-10-CM | POA: Diagnosis not present

## 2017-01-13 DIAGNOSIS — G8918 Other acute postprocedural pain: Secondary | ICD-10-CM | POA: Diagnosis not present

## 2017-01-13 DIAGNOSIS — D649 Anemia, unspecified: Secondary | ICD-10-CM | POA: Diagnosis not present

## 2017-01-13 DIAGNOSIS — Z882 Allergy status to sulfonamides status: Secondary | ICD-10-CM | POA: Diagnosis not present

## 2017-01-13 DIAGNOSIS — K5901 Slow transit constipation: Secondary | ICD-10-CM | POA: Diagnosis not present

## 2017-01-13 DIAGNOSIS — G8929 Other chronic pain: Secondary | ICD-10-CM | POA: Diagnosis not present

## 2017-01-13 DIAGNOSIS — Z8673 Personal history of transient ischemic attack (TIA), and cerebral infarction without residual deficits: Secondary | ICD-10-CM

## 2017-01-13 DIAGNOSIS — K219 Gastro-esophageal reflux disease without esophagitis: Secondary | ICD-10-CM | POA: Diagnosis present

## 2017-01-13 DIAGNOSIS — I998 Other disorder of circulatory system: Principal | ICD-10-CM | POA: Diagnosis present

## 2017-01-13 DIAGNOSIS — R54 Age-related physical debility: Secondary | ICD-10-CM | POA: Diagnosis present

## 2017-01-13 DIAGNOSIS — M792 Neuralgia and neuritis, unspecified: Secondary | ICD-10-CM

## 2017-01-13 DIAGNOSIS — G894 Chronic pain syndrome: Secondary | ICD-10-CM

## 2017-01-13 DIAGNOSIS — Z9842 Cataract extraction status, left eye: Secondary | ICD-10-CM | POA: Diagnosis not present

## 2017-01-13 DIAGNOSIS — Z86718 Personal history of other venous thrombosis and embolism: Secondary | ICD-10-CM

## 2017-01-13 DIAGNOSIS — Z888 Allergy status to other drugs, medicaments and biological substances status: Secondary | ICD-10-CM

## 2017-01-13 DIAGNOSIS — Z4781 Encounter for orthopedic aftercare following surgical amputation: Secondary | ICD-10-CM | POA: Diagnosis present

## 2017-01-13 DIAGNOSIS — M545 Low back pain: Secondary | ICD-10-CM | POA: Diagnosis not present

## 2017-01-13 DIAGNOSIS — Z8249 Family history of ischemic heart disease and other diseases of the circulatory system: Secondary | ICD-10-CM

## 2017-01-13 DIAGNOSIS — D62 Acute posthemorrhagic anemia: Secondary | ICD-10-CM | POA: Diagnosis not present

## 2017-01-13 DIAGNOSIS — N183 Chronic kidney disease, stage 3 unspecified: Secondary | ICD-10-CM

## 2017-01-13 DIAGNOSIS — I739 Peripheral vascular disease, unspecified: Secondary | ICD-10-CM | POA: Diagnosis not present

## 2017-01-13 DIAGNOSIS — Z89511 Acquired absence of right leg below knee: Secondary | ICD-10-CM

## 2017-01-13 DIAGNOSIS — M797 Fibromyalgia: Secondary | ICD-10-CM | POA: Diagnosis present

## 2017-01-13 DIAGNOSIS — I5043 Acute on chronic combined systolic (congestive) and diastolic (congestive) heart failure: Secondary | ICD-10-CM | POA: Diagnosis not present

## 2017-01-13 DIAGNOSIS — I779 Disorder of arteries and arterioles, unspecified: Secondary | ICD-10-CM | POA: Diagnosis present

## 2017-01-13 DIAGNOSIS — I4891 Unspecified atrial fibrillation: Secondary | ICD-10-CM | POA: Diagnosis present

## 2017-01-13 DIAGNOSIS — Z90710 Acquired absence of both cervix and uterus: Secondary | ICD-10-CM | POA: Diagnosis not present

## 2017-01-13 DIAGNOSIS — Z87891 Personal history of nicotine dependence: Secondary | ICD-10-CM | POA: Diagnosis not present

## 2017-01-13 DIAGNOSIS — G546 Phantom limb syndrome with pain: Secondary | ICD-10-CM | POA: Diagnosis not present

## 2017-01-13 DIAGNOSIS — S99912A Unspecified injury of left ankle, initial encounter: Secondary | ICD-10-CM | POA: Diagnosis not present

## 2017-01-13 DIAGNOSIS — Z886 Allergy status to analgesic agent status: Secondary | ICD-10-CM | POA: Diagnosis not present

## 2017-01-13 DIAGNOSIS — T148XXA Other injury of unspecified body region, initial encounter: Secondary | ICD-10-CM | POA: Diagnosis not present

## 2017-01-13 DIAGNOSIS — Z96622 Presence of left artificial elbow joint: Secondary | ICD-10-CM | POA: Diagnosis present

## 2017-01-13 DIAGNOSIS — I7389 Other specified peripheral vascular diseases: Secondary | ICD-10-CM | POA: Diagnosis not present

## 2017-01-13 LAB — CREATININE, SERUM
CREATININE: 1 mg/dL (ref 0.44–1.00)
GFR, EST AFRICAN AMERICAN: 57 mL/min — AB (ref >60)
GFR, EST NON AFRICAN AMERICAN: 50 mL/min — AB (ref >60)

## 2017-01-13 MED ORDER — CLOPIDOGREL BISULFATE 75 MG PO TABS: 75.0000 mg | ORAL_TABLET | Freq: Every day | ORAL | 1 refills | Status: DC

## 2017-01-13 MED ORDER — METHOCARBAMOL 500 MG PO TABS
500.0000 mg | ORAL_TABLET | Freq: Four times a day (QID) | ORAL | Status: DC | PRN
  Administered 2017-01-14 – 2017-01-21 (×15): 500 mg via ORAL
  Filled 2017-01-13 (×15): qty 1

## 2017-01-13 MED ORDER — DIAZEPAM 5 MG PO TABS
2.5000 mg | ORAL_TABLET | Freq: Every evening | ORAL | Status: DC | PRN
  Administered 2017-01-14 – 2017-01-15 (×2): 2.5 mg via ORAL
  Filled 2017-01-13 (×2): qty 1

## 2017-01-13 MED ORDER — PROSIGHT PO TABS
1.0000 | ORAL_TABLET | Freq: Every day | ORAL | Status: DC
  Administered 2017-01-14 – 2017-01-20 (×8): 1 via ORAL
  Filled 2017-01-13 (×8): qty 1

## 2017-01-13 MED ORDER — LABETALOL HCL 5 MG/ML IV SOLN: 10.0000 mg | INTRAVENOUS | Status: DC | PRN

## 2017-01-13 MED ORDER — PANTOPRAZOLE SODIUM 40 MG PO TBEC
40.0000 mg | DELAYED_RELEASE_TABLET | Freq: Every day | ORAL | Status: DC
  Administered 2017-01-14 – 2017-01-21 (×9): 40 mg via ORAL
  Filled 2017-01-13 (×9): qty 1

## 2017-01-13 MED ORDER — GABAPENTIN 300 MG PO CAPS
300.0000 mg | ORAL_CAPSULE | Freq: Every day | ORAL | Status: DC
  Administered 2017-01-14 – 2017-01-20 (×8): 300 mg via ORAL
  Filled 2017-01-13 (×8): qty 1

## 2017-01-13 MED ORDER — METOPROLOL TARTRATE 5 MG/5ML IV SOLN: 2.0000 mg | INTRAVENOUS | Status: DC | PRN

## 2017-01-13 MED ORDER — HYDRALAZINE HCL 20 MG/ML IJ SOLN
5.0000 mg | INTRAMUSCULAR | Status: AC | PRN
  Administered 2017-01-16 – 2017-01-21 (×2): 5 mg via INTRAVENOUS
  Filled 2017-01-13 (×2): qty 1

## 2017-01-13 MED ORDER — HYDROMORPHONE HCL 2 MG PO TABS: 2.0000 mg | ORAL_TABLET | Freq: Three times a day (TID) | ORAL | 0 refills | Status: DC | PRN

## 2017-01-13 MED ORDER — HEPARIN (PORCINE) IN NACL 100-0.45 UNIT/ML-% IJ SOLN
700.0000 [IU]/h | INTRAMUSCULAR | Status: DC
  Administered 2017-01-14 – 2017-01-15 (×2): 700 [IU]/h via INTRAVENOUS
  Filled 2017-01-13 (×2): qty 250

## 2017-01-13 MED ORDER — GERHARDT'S BUTT CREAM: 1.0000 "application " | TOPICAL_CREAM | Freq: Two times a day (BID) | CUTANEOUS | 1 refills | Status: DC

## 2017-01-13 MED ORDER — HYDROCHLOROTHIAZIDE 10 MG/ML ORAL SUSPENSION
6.2500 mg | Freq: Every day | ORAL | Status: DC
  Administered 2017-01-14 – 2017-01-21 (×7): 6.25 mg via ORAL
  Filled 2017-01-13 (×9): qty 1.25

## 2017-01-13 MED ORDER — VALSARTAN-HYDROCHLOROTHIAZIDE 160-12.5 MG PO TABS: 0.5000 | ORAL_TABLET | Freq: Every day | ORAL | Status: DC

## 2017-01-13 MED ORDER — PHENOL 1.4 % MT LIQD: 1.0000 | OROMUCOSAL | Status: DC | PRN

## 2017-01-13 MED ORDER — OXYCODONE HCL 5 MG PO TABS
5.0000 mg | ORAL_TABLET | ORAL | Status: DC | PRN
  Administered 2017-01-14 – 2017-01-21 (×22): 5 mg via ORAL
  Filled 2017-01-13 (×24): qty 1

## 2017-01-13 MED ORDER — GERHARDT'S BUTT CREAM
1.0000 "application " | TOPICAL_CREAM | Freq: Two times a day (BID) | CUTANEOUS | Status: DC
  Administered 2017-01-14 – 2017-01-21 (×10): 1 via TOPICAL
  Filled 2017-01-13: qty 1

## 2017-01-13 MED ORDER — GUAIFENESIN-DM 100-10 MG/5ML PO SYRP: 15.0000 mL | ORAL_SOLUTION | ORAL | Status: DC | PRN

## 2017-01-13 MED ORDER — METOPROLOL TARTRATE 25 MG PO TABS: 25.0000 mg | ORAL_TABLET | Freq: Every day | ORAL | 1 refills | Status: DC

## 2017-01-13 MED ORDER — HEPARIN BOLUS VIA INFUSION
2000.0000 [IU] | Freq: Once | INTRAVENOUS | Status: AC
  Administered 2017-01-14: 2000 [IU] via INTRAVENOUS
  Filled 2017-01-13: qty 2000

## 2017-01-13 MED ORDER — DIAZEPAM 5 MG PO TABS: 2.5000 mg | ORAL_TABLET | Freq: Every evening | ORAL | 0 refills | Status: DC | PRN

## 2017-01-13 MED ORDER — VITAMIN D 1000 UNITS PO TABS
2000.0000 [IU] | ORAL_TABLET | Freq: Every day | ORAL | Status: DC
  Administered 2017-01-14 – 2017-01-20 (×8): 2000 [IU] via ORAL
  Filled 2017-01-13 (×8): qty 2

## 2017-01-13 MED ORDER — VITAMIN B-12 100 MCG PO TABS
100.0000 ug | ORAL_TABLET | Freq: Every day | ORAL | Status: DC
  Administered 2017-01-14 – 2017-01-21 (×7): 100 ug via ORAL
  Filled 2017-01-13 (×8): qty 1

## 2017-01-13 MED ORDER — METOPROLOL TARTRATE 25 MG PO TABS
25.0000 mg | ORAL_TABLET | Freq: Every day | ORAL | Status: DC
  Administered 2017-01-14 – 2017-01-20 (×8): 25 mg via ORAL
  Filled 2017-01-13 (×8): qty 1

## 2017-01-13 MED ORDER — METHOCARBAMOL 500 MG PO TABS: 500.0000 mg | ORAL_TABLET | Freq: Four times a day (QID) | ORAL | 0 refills | Status: DC | PRN

## 2017-01-13 MED ORDER — ONDANSETRON HCL 4 MG/2ML IJ SOLN: 4.0000 mg | Freq: Four times a day (QID) | INTRAMUSCULAR | Status: DC | PRN

## 2017-01-13 MED ORDER — VALSARTAN-HYDROCHLOROTHIAZIDE 160-12.5 MG PO TABS: 0.5000 | ORAL_TABLET | Freq: Every day | ORAL | 0 refills | Status: DC

## 2017-01-13 MED ORDER — PANTOPRAZOLE SODIUM 40 MG PO TBEC: 40.0000 mg | DELAYED_RELEASE_TABLET | Freq: Every day | ORAL | 1 refills | Status: DC

## 2017-01-13 MED ORDER — POTASSIUM CHLORIDE CRYS ER 10 MEQ PO TBCR
10.0000 meq | EXTENDED_RELEASE_TABLET | Freq: Every day | ORAL | Status: DC
  Administered 2017-01-14 – 2017-01-21 (×7): 10 meq via ORAL
  Filled 2017-01-13 (×8): qty 1

## 2017-01-13 MED ORDER — GABAPENTIN 300 MG PO CAPS: 300.0000 mg | ORAL_CAPSULE | Freq: Every day | ORAL | 1 refills | Status: DC

## 2017-01-13 MED ORDER — OXYCODONE HCL 5 MG PO TABS: 5.0000 mg | ORAL_TABLET | ORAL | 0 refills | Status: DC | PRN

## 2017-01-13 MED ORDER — OXYCODONE HCL 5 MG PO TABS
5.0000 mg | ORAL_TABLET | Freq: Once | ORAL | Status: AC
  Administered 2017-01-13: 5 mg via ORAL
  Filled 2017-01-13: qty 1

## 2017-01-13 MED ORDER — ACETAMINOPHEN 500 MG PO TABS
1000.0000 mg | ORAL_TABLET | Freq: Two times a day (BID) | ORAL | Status: DC | PRN
  Administered 2017-01-14 – 2017-01-20 (×4): 1000 mg via ORAL
  Filled 2017-01-13 (×4): qty 2

## 2017-01-13 MED ORDER — POTASSIUM CHLORIDE CRYS ER 10 MEQ PO TBCR: 10.0000 meq | EXTENDED_RELEASE_TABLET | Freq: Every day | ORAL | 0 refills | Status: DC

## 2017-01-13 MED ORDER — IRBESARTAN 150 MG PO TABS
75.0000 mg | ORAL_TABLET | Freq: Every day | ORAL | Status: DC
  Administered 2017-01-14 – 2017-01-21 (×8): 75 mg via ORAL
  Filled 2017-01-13 (×8): qty 1

## 2017-01-13 MED ORDER — CLOPIDOGREL BISULFATE 75 MG PO TABS
75.0000 mg | ORAL_TABLET | Freq: Every day | ORAL | Status: DC
  Administered 2017-01-14 (×2): 75 mg via ORAL
  Filled 2017-01-13 (×2): qty 1

## 2017-01-13 MED ORDER — LOPERAMIDE HCL 2 MG PO CAPS: 2.0000 mg | ORAL_CAPSULE | ORAL | Status: DC | PRN

## 2017-01-13 MED ORDER — FENTANYL 12 MCG/HR TD PT72
12.5000 ug | MEDICATED_PATCH | TRANSDERMAL | 0 refills | Status: DC
Start: 2017-01-15 — End: 2017-01-28

## 2017-01-13 MED ORDER — ALUM & MAG HYDROXIDE-SIMETH 200-200-20 MG/5ML PO SUSP: 15.0000 mL | ORAL | Status: DC | PRN

## 2017-01-13 NOTE — ED Provider Notes (Signed)
[facility] DEPT Provider Note   CSN: [id number] Arrival date & time: 01/13/17 1710     History    Chief Complaint  Patient presents with  . Fall     HPI [name] is a 83 y.o. female.  83yo F w/ extensive PMH including A fib, PVD s/p R AKA, CVA who p/w L foot and ankle pain. PT was discharged from rehab Today after a right BKA due to peripheral vascular disease. She was transferring in her wheelchair and twisted her left foot/ankle. Since then she has had numbness in her foot and her foot is cold. She reports severe pain in her leg from her knee down to her foot. She has had some numbness of her foot previously but this seems worse and daughter is concerned because these were the symptoms she has had prior to requiring vascular surgery interventions in the past. She denies any head injury or fall.   Past Medical History:  Diagnosis Date  . Anemia   . Anginal pain ([place])   . Arthritis    "qwhere" (09/05/2016)  . Atrial fibrillation ([place]) 09/2016  . Chronic lower back pain   . Complication of anesthesia    "takes a long time for it to wear off; I can hallucinate if I take too much" (09/05/2016)  . DVT (deep venous thrombosis) ([place]) 10/2009  . Fall from steps 08/31/2013   Fx. pelvis, Left Hip, Left Elbow  . Fibromyalgia   . GERD (gastroesophageal reflux disease)    09/22/16- "no too much anymore"  . GI bleed 10/24/2016  . High cholesterol   . History of blood transfusion   . History of hiatal hernia   . Hypertension   . Macular degeneration, wet ([place])    "started in right eye; now legally blind in that eye; now started in left eye but pretty much in control" (09/05/2016)  . Osteoporosis   . Peripheral vascular disease ([place])    nonviable tissue Right foot  . PONV (postoperative nausea and vomiting)   . Squamous cell carcinoma of skin of right calf Aug. 2015  . Stroke [facility])    TIA's no residual  . TIA (transient ischemic attack)    "several at once; none in a  long time" (09/05/2016)     Patient Active Problem List   Diagnosis Date Noted  . Post-op pain 01/03/2017  . Phantom limb pain ([place]) 01/03/2017  . Status post below knee amputation of right lower extremity ([place]) 12/30/2016  . Acute on chronic combined systolic and diastolic CHF (congestive heart failure) ([place]) 12/22/2016  . Anemia 12/22/2016  . Palliative care encounter   . Lower extremity pain, right   . Acute GI bleeding 10/23/2016  . Paroxysmal atrial fibrillation ([place]) 10/03/2016  . 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 PAD (peripheral artery disease) ([place]) 09/05/2016  . Groin pain 04/02/2015  . Aftercare following surgery of the circulatory system, [place] 12/13/2013  . Shingles 10/26/2013  . Anxiety 10/26/2013  . Acute blood loss anemia 09/05/2013  . Elbow fracture, left 09/05/2013  . Left acetabular fracture ([place]) 09/05/2013  . Ankle fracture, left 09/05/2013  . HTN (hypertension) 09/03/2013  . HLD (hyperlipidemia) 09/03/2013  . Peripheral vascular disease, unspecified 05/10/2012  . Chronic total occlusion of artery of the extremities ([place]) 01/19/2012    Past Surgical History:  Procedure Laterality Date  . ABDOMINAL AORTAGRAM N/A 12/26/2014   Procedure: ABDOMINAL Maxcine Ham;  Surgeon: Durene Fruits  Pierre Bali, MD;  Location: [facility] CATH LAB;  Service: Cardiovascular;  Laterality: N/A;  . AMPUTATION Right 12/23/2016   Procedure: AMPUTATION BELOW KNEE;  Surgeon: Serafina Mitchell, MD;  Location: [facility];  Service: Vascular;  Laterality: Right;  . AORTOGRAM N/A 09/26/2016   Procedure: AORTOGRAM;  Surgeon: Waynetta Sandy, MD;  Location: [facility];  Service: Vascular;  Laterality: N/A;  . CARPAL TUNNEL RELEASE Right   . CATARACT EXTRACTION W/ INTRAOCULAR LENS  IMPLANT, BILATERAL Bilateral   . COLONOSCOPY    . DILATION AND CURETTAGE OF UTERUS    . EYE SURGERY Right    "macular OR"  . FEMORAL ARTERY STENT  12-11-10   Left  SFA  . FEMORAL-POPLITEAL BYPASS GRAFT Left 09/24/2016   Procedure: REDO FEMORAL TO POPLITEAL ARTERY BYPASS GRAFT USING 6MM PROPATEN RINGED GORTEX GRAFT;  Surgeon: Serafina Mitchell, MD;  Location: [facility];  Service: Vascular;  Laterality: Left;  . I&D EXTREMITY Right 12/17/2016   Procedure: IRRIGATION AND DEBRIDEMENT RIGHT FOOT;  Surgeon: Serafina Mitchell, MD;  Location: [facility];  Service: Vascular;  Laterality: Right;  . INCISION AND DRAINAGE OF WOUND Left 10/25/2009   leg/notes 11/13/2009  . INSERTION OF ILIAC STENT Left 12/26/2014   Procedure: INSERTION OF ILIAC STENT;  Surgeon: Serafina Mitchell, MD;  Location[facility] C[facility] LAB;  Service: Cardiovascular;  Laterality: Left;  . INSERTION OF ILIAC STENT Left 09/26/2016   Procedure: SUB INTIMAL INSERTION OF SUPERFICIAL FEMORAL ARTERY AND BELOW KNEE BYPASS GRAFT;  Surgeon: Waynetta Sandy, MD;  Location: [facility];  Service: Vascular;  Laterality: Left;  . JOINT REPLACEMENT     knee  . JOINT REPLACEMENT Left Oct. 17, 2014   Elbow ( pt fell 08-31-13 )  . ORIF SHOULDER FRACTURE Right    "it was crushed"  . PERIPHERAL VASCULAR CATHETERIZATION N/A 10/30/2015   Procedure: Abdominal Aortogram w/Lower Extremity;  Surgeon: Serafina Mitchell, MD;  Location: [facility] CV LAB;  Service: Cardiovascular;  Laterality: N/A;  . PERIPHERAL VASCULAR CATHETERIZATION  10/30/2015   Procedure: Peripheral Vascular Intervention;  Surgeon: Serafina Mitchell, MD;  Location: [facility] CV LAB;  Service: Cardiovascular;;  . PERIPHERAL VASCULAR CATHETERIZATION N/A 04/01/2016   Procedure: Abdominal Aortogram w/Lower Extremity;  Surgeon: Serafina Mitchell, MD;  Location: [facility] CV LAB;  Service: Cardiovascular;  Laterality: N/A;  . PERIPHERAL VASCULAR CATHETERIZATION Left 04/01/2016   Procedure: Peripheral Vascular Atherectomy;  Surgeon: Serafina Mitchell, MD;  Location: [facility] CV LAB;  Service: Cardiovascular;  Laterality: Left;  Superficial femoral artery.  Marland Kitchen PERIPHERAL VASCULAR  CATHETERIZATION Right 09/05/2016   "stent"  . PERIPHERAL VASCULAR CATHETERIZATION N/A 09/05/2016   Procedure: Abdominal Aortogram w/Lower Extremity;  Surgeon: Serafina Mitchell, MD;  Location: [facility] CV LAB;  Service: Cardiovascular;  Laterality: N/A;  . PERIPHERAL VASCULAR CATHETERIZATION Right 09/05/2016   Procedure: Peripheral Vascular Intervention;  Surgeon: Serafina Mitchell, MD;  Location: [facility] CV LAB;  Service: Cardiovascular;  Laterality: Right;  Superficial Femoral  . PERIPHERAL VASCULAR CATHETERIZATION Left 09/09/2016   Procedure: Lower Extremity Angiography;  Surgeon: Serafina Mitchell, MD;  Location: [facility] CV LAB;  Service: Cardiovascular;  Laterality: Left;  . PR VEIN BYPASS GRAFT,AORTO-FEM-POP  09-13-09   Left Fem-pop  . THROMBECTOMY FEMORAL ARTERY Right 09/26/2016   Procedure: Thromboembolectomy Right Lower Extremity, Right Femoral Artery Endarterectomy with Patch Angioplasty; Right Lower Extremity Angiogram ;  Surgeon: Waynetta Sandy, MD;  Location: [facility];  Service: Vascular;  Laterality: Right;  .  TOTAL ELBOW ARTHROPLASTY Left 09/03/2013   Procedure: LEFT TOTAL ELBOW ARTHROPLASTY;  Surgeon: Roseanne Kaufman, MD;  Location: [facility];  Service: Orthopedics;  Laterality: Left;  . TOTAL KNEE ARTHROPLASTY Left 06/2006  . TRANSMETATARSAL AMPUTATION Right 12/11/2016   Procedure: TRANSMETATARSAL AMPUTATION;  Surgeon: Serafina Mitchell, MD;  Location: [facility];  Service: Vascular;  Laterality: Right;  . TUBAL LIGATION    . VAGINAL HYSTERECTOMY      OB History    No data available        Home Medications    Prior to Admission medications   Medication Sig Start Date End Date Taking? Authorizing Provider  acetaminophen (TYLENOL) 500 MG tablet Take 1,000 mg by mouth 2 (two) times daily as needed for moderate pain or headache. Patient took this medication for her pain.    Historical Provider, MD  Cholecalciferol (VITAMIN D3) 2000 UNITS TABS Take 2,000 Units by mouth at  bedtime.     Historical Provider, MD  clopidogrel (PLAVIX) 75 MG tablet Take 1 tablet (75 mg total) by mouth daily. 01/13/17   Lavon Paganini Angiulli, PA-C  Cyanocobalamin (VITAMIN B-12 PO) Take 1 tablet by mouth daily.    Historical Provider, MD  diazepam (VALIUM) 5 MG tablet Take 0.5 tablets (2.5 mg total) by mouth at bedtime as needed for anxiety. 01/13/17   Lavon Paganini Angiulli, PA-C  fentaNYL (DURAGESIC - DOSED MCG/HR) 12 MCG/HR Place 1 patch (12.5 mcg total) onto the skin every 3 (three) days. 01/15/17   Lavon Paganini Angiulli, PA-C  gabapentin (NEURONTIN) 300 MG capsule Take 1 capsule (300 mg total) by mouth daily at 8 pm. 01/13/17   Lavon Paganini Angiulli, PA-C  Hydrocortisone (GERHARDT'S BUTT CREAM) CREA Apply 1 application topically 2 (two) times daily. 01/13/17   Lavon Paganini Angiulli, PA-C  loperamide (IMODIUM) 2 MG capsule Take 2 mg by mouth as needed for diarrhea or loose stools.    Historical Provider, MD  methocarbamol (ROBAXIN) 500 MG tablet Take 1 tablet (500 mg total) by mouth every 6 (six) hours as needed for muscle spasms. 01/13/17   Lavon Paganini Angiulli, PA-C  metoprolol tartrate (LOPRESSOR) 25 MG tablet Take 1 tablet (25 mg total) by mouth at bedtime. 01/13/17   Lavon Paganini Angiulli, PA-C  Multiple Vitamins-Minerals (EYE VITAMINS PO) Take 1 tablet by mouth at bedtime.     Historical Provider, MD  oxyCODONE (OXY IR/ROXICODONE) 5 MG immediate release tablet Take 1 tablet (5 mg total) by mouth every 4 (four) hours as needed for moderate pain, severe pain or breakthrough pain. 01/13/17   Lavon Paganini Angiulli, PA-C  pantoprazole (PROTONIX) 40 MG tablet Take 1 tablet (40 mg total) by mouth daily. 01/13/17   Lavon Paganini Angiulli, PA-C  potassium chloride (K-DUR,KLOR-CON) 10 MEQ tablet Take 1 tablet (10 mEq total) by mouth daily. 01/13/17   Lavon Paganini Angiulli, PA-C  valsartan-hydrochlorothiazide (DIOVAN-HCT) 160-12.5 MG tablet Take 0.5 tablets by mouth daily. 01/13/17   Lavon Paganini Angiulli, PA-C      Family History  Problem  Relation Age of Onset  . Heart disease Father     Heart Disease before age 76  . Hyperlipidemia Father   . Hypertension Father   . Alcohol abuse Father   . Heart disease Brother   . Hyperlipidemia Brother   . Hypertension Brother   . Deep vein thrombosis Brother   . Heart disease Son     Heart Disease before age 50  . Hyperlipidemia Son   . Hypertension Son   .  Heart attack Son   . Diabetes Son   . Hypertension Son   . Hyperlipidemia Sister   . Hypertension Sister      Social History  Substance Use Topics  . Smoking status: Former Smoker    Types: Cigarettes    Quit date: 11/17/1946  . Smokeless tobacco: Never Used     Comment: "never smoked much"  . Alcohol use No     Allergies     Motrin [ibuprofen]; Statins; Morphine and related; Oxycontin [oxycodone hcl]; Promethazine hcl; and Sulfa antibiotics    Review of Systems  10 Systems reviewed and are negative for acute change except as noted in the HPI.   Physical Exam Updated Vital Signs BP 171/72   Pulse 86   Temp 97.6 F (36.4 C) (Oral)   Resp 16   Ht 5\' 5"  (1.651 m)   Wt 113 lb (51.3 kg)   SpO2 99%   BMI 18.80 kg/m   Physical Exam  Constitutional: She is oriented to person, place, and time. She appears well-developed and well-nourished. No distress.  uncomfortable  HENT:  Head: Normocephalic and atraumatic.  Eyes: Conjunctivae are normal.  Neck: Neck supple.  Cardiovascular: Normal rate, regular rhythm and normal heart sounds.   No murmur heard. Pulmonary/Chest: Effort normal and breath sounds normal.  Abdominal: Soft. Bowel sounds are normal. She exhibits no distension. There is no tenderness.  Musculoskeletal: She exhibits no edema.  L foot held in plantar flexion, diffuse tenderness from knee through foot without focal area of pain or deformity; unable to palpate DP or PT pulse L foot  R leg BKA  Neurological: She is alert and oriented to person, place, and time.  Fluent speech  Skin: Skin  is warm and dry.  Pale, cool L foot with several toes severely pale  Psychiatric:  Uncomfortable, anxious  Nursing note and vitals reviewed.     ED Treatments / Results  Labs (all labs ordered are listed, but only abnormal results are displayed) Labs Reviewed - No data to display   EKG  EKG Interpretation  Date/Time:    Ventricular Rate:    PR Interval:    QRS Duration:   QT Interval:    QTC Calculation:   R Axis:     Text Interpretation:           Radiology Dg Ankle Complete Left  Result Date: 01/13/2017 CLINICAL DATA:  Twisting injury left ankle today. Initial encounter. EXAM: LEFT ANKLE COMPLETE - 3+ VIEW COMPARISON:  Plain films left ankle 09/01/2013. FINDINGS: There is no evidence of fracture, dislocation, or joint effusion. There is no evidence of arthropathy or other focal bone abnormality. Soft tissues are unremarkable. IMPRESSION: Negative exam. Electronically Signed   By: Inge Rise M.D.   On: 01/13/2017 20:00    Procedures Procedures (including critical care time) .Critical Care Performed by: Sharlett Iles Authorized by: Sharlett Iles   Critical care provider statement:    Critical care time (minutes):  30   Critical care time was exclusive of:  Separately billable procedures and treating other patients   Critical care was necessary to treat or prevent imminent or life-threatening deterioration of the following conditions:  Circulatory failure   Critical care was time spent personally by me on the following activities:  Development of treatment plan with patient or surrogate, discussions with consultants, examination of patient, obtaining history from patient or surrogate and review of old charts    Medications Ordered in ED  Medications  oxyCODONE (Oxy IR/ROXICODONE) immediate release tablet 5 mg (not administered)     Initial Impression / Assessment and Plan / ED Course  I have reviewed the triage vital signs and the  nursing notes.  Pertinent imaging results that were available during my care of the patient were reviewed by me and considered in my medical decision making (see chart for details).     PT w/ recent R BKA due to PVD, just released from rehab today, p/w pain from L knee through ankle and foot after twisting ankle. She was concerned about foot being numb and cold. On exam, I was unable to palpate or Doppler L foot DP or PT pulse and pt had pale toes. Ankle XR negative. Contacted vascular surgeon, Dr. Scot Dock, who will initiate heparin drip and admit for vascular intervention.   Final Clinical Impressions(s) / ED Diagnoses   Final diagnoses:  Cold foot with peripheral vascular disease [facility])     New Prescriptions   No medications on file       Sharlett Iles, MD 01/14/17 [phone]

## 2017-01-13 NOTE — ED Notes (Signed)
ED Provider at bedside. 

## 2017-01-13 NOTE — Progress Notes (Signed)
Pt discharged to home with daughter. Pt transferred to vehicle with assistance with PT. Discharge instructions given. Pt ready for discharge.

## 2017-01-13 NOTE — H&P (Signed)
Patient name: [name] MRN: [id number] DOB: [birth year] Sex: female  REASON FOR ADMISSION: Ischemic left foot  HPI: [name] is a 83 y.o. 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 She had multiple revascularization attempts on the right side but ultimately required a below-the-knee amputation.  She has a long history of rest pain in the left lower extremity. She has undergone multiple interventions on the left.  1. October 2010: She had an above-knee to below-knee popliteal artery bypass with vein in the left leg.  2. 12/26/2014: She had angioplasty and stenting of the left external iliac artery with angioplasty and stenting of the left superficial femoral artery.  3. 10/30/2015: She had a stent in the left superficial femoral artery and also angioplasty of an in stent stenosis in the left popliteal artery.  4. 04/01/2016: She had atherectomy of the left superficial femoral artery and popliteal artery and also drug coated balloon angioplasty of the left superficial femoral artery and popliteal artery.  5. 09/24/2016: The patient had a redo left femoral to above-knee popliteal artery bypass with a 6 mm PTFE graft. She also had left common femoral artery external iliac artery and profunda femoral endarterectomy.  6. 09/26/2016: The redo femoropopliteal bypass graft failed early and she underwent subintimal angioplasty of the left superficial femoral artery and below-knee popliteal artery stenting with multiple viabahn grafts.   7. 09/27/2016: She had right common femoral artery endarterectomy with bovine pericardial patch angioplasty and right lower extremity thromboembolectomy.  She was discharged from the hospital today and was elevating her leg in her recliner and had numbness in the left leg. The pain and numbness extended up to the mid calf. She presented to the emergency department. X-ray  showed no evidence of a fracture to her foot as she had reportedly fallen also.  Past Medical History:  Diagnosis Date  . Anemia   . Anginal pain ([place])   . Arthritis    "qwhere" (09/05/2016)  . Atrial fibrillation ([place]) 09/2016  . Chronic lower back pain   . Complication of anesthesia    "takes a long time for it to wear off; I can hallucinate if I take too much" (09/05/2016)  . DVT (deep venous thrombosis) ([place]) 10/2009  . Fall from steps 08/31/2013   Fx. pelvis, Left Hip, Left Elbow  . Fibromyalgia   . GERD (gastroesophageal reflux disease)    09/22/16- "no too much anymore"  . GI bleed 10/24/2016  . High cholesterol   . History of blood transfusion   . History of hiatal hernia   . Hypertension   . Macular degeneration, wet ([place])    "started in right eye; now legally blind in that eye; now started in left eye but pretty much in control" (09/05/2016)  . Osteoporosis   . Peripheral vascular disease ([place])    nonviable tissue Right foot  . PONV (postoperative nausea and vomiting)   . Squamous cell carcinoma of skin of right calf Aug. 2015  . Stroke ([place])    TIA's no residual  . TIA (transient ischemic attack)    "several at once; none in a long time" (09/05/2016)    Family History  Problem Relation Age of Onset  . Heart disease Father     Heart Disease before age 70  . Hyperlipidemia Father   . Hypertension Father   . Alcohol abuse Father   . Heart disease Brother   .  Hyperlipidemia Brother   . Hypertension Brother   . Deep vein thrombosis Brother   . Heart disease Son     Heart Disease before age 41  . Hyperlipidemia Son   . Hypertension Son   . Heart attack Son   . Diabetes Son   . Hypertension Son   . Hyperlipidemia Sister   . Hypertension Sister     SOCIAL HISTORY: Social History   Social History  . Marital status: Married    Spouse name: N/A  . Number of children: N/A  . Years of education: N/A   Occupational History  . Not on file.   Social  History Main Topics  . Smoking status: Former Smoker    Types: Cigarettes    Quit date: 11/17/1946  . Smokeless tobacco: Never Used     Comment: "never smoked much"  . Alcohol use No  . Drug use: No  . Sexual activity: Not on file   Other Topics Concern  . Not on file   Social History Narrative  . No narrative on file    Allergies  Allergen Reactions  . Motrin [Ibuprofen] Other (See Comments)    ADVERSE REACTION - GI BLEED  . Statins Other (See Comments)    ADVERSE REACTION MUSCLE PAIN & WEAKNESS  . Morphine And Related Other (See Comments)    HALLUCINATIONS REACTION IS SIDE EFFECT  . Oxycontin [Oxycodone Hcl] Other (See Comments)    [REACTION IS SIDE EFFECT]  Hallinatations  . Promethazine Hcl Other (See Comments)    IV  Drug only, makes her act crazy  . Sulfa Antibiotics Nausea And Vomiting    Current Facility-Administered Medications  Medication Dose Route Frequency Provider Last Rate Last Dose  . acetaminophen (TYLENOL) tablet 1,000 mg  1,000 mg Oral BID PRN Angelia Mould, MD      . clopidogrel (PLAVIX) tablet 75 mg  75 mg Oral Daily Angelia Mould, MD      . diazepam (VALIUM) tablet 2.5 mg  2.5 mg Oral QHS PRN Angelia Mould, MD      . EYE VITAMINS CAPS   Oral QHS Angelia Mould, MD      . gabapentin (NEURONTIN) capsule 300 mg  300 mg Oral Q2000 Angelia Mould, MD      . Gerhardt's butt cream 1 application  1 application Topical BID Angelia Mould, MD      . loperamide (IMODIUM) capsule 2 mg  2 mg Oral PRN Angelia Mould, MD      . methocarbamol (ROBAXIN) tablet 500 mg  500 mg Oral Q6H PRN Angelia Mould, MD      . metoprolol tartrate (LOPRESSOR) tablet 25 mg  25 mg Oral QHS Angelia Mould, MD      . oxyCODONE (Oxy IR/ROXICODONE) immediate release tablet 5 mg  5 mg Oral Q4H PRN Angelia Mould, MD      . pantoprazole (PROTONIX) EC tablet 40 mg  40 mg Oral Daily Angelia Mould, MD      .  potassium chloride (K-DUR,KLOR-CON) CR tablet 10 mEq  10 mEq Oral Daily Angelia Mould, MD      . valsartan-hydrochlorothiazide (DIOVAN-HCT) 160-12.5 MG per tablet 0.5 tablet  0.5 tablet Oral Daily Angelia Mould, MD      . vitamin B-12 (CYANOCOBALAMIN) tablet 50 mcg  50 mcg Oral Daily Angelia Mould, MD      . Vitamin D3 TABS 2,000 Units  2,000 Units Oral QHS  Angelia Mould, MD       Current Outpatient Prescriptions  Medication Sig Dispense Refill  . acetaminophen (TYLENOL) 500 MG tablet Take 1,000 mg by mouth 2 (two) times daily as needed for moderate pain or headache. Patient took this medication for her pain.    . Cholecalciferol (VITAMIN D3) 2000 UNITS TABS Take 2,000 Units by mouth at bedtime.     . clopidogrel (PLAVIX) 75 MG tablet Take 1 tablet (75 mg total) by mouth daily. 30 tablet 1  . Cyanocobalamin (VITAMIN B-12 PO) Take 1 tablet by mouth daily.    . diazepam (VALIUM) 5 MG tablet Take 0.5 tablets (2.5 mg total) by mouth at bedtime as needed for anxiety. 15 tablet 0  . [START ON 01/15/2017] fentaNYL (DURAGESIC - DOSED MCG/HR) 12 MCG/HR Place 1 patch (12.5 mcg total) onto the skin every 3 (three) days. 15 patch 0  . gabapentin (NEURONTIN) 300 MG capsule Take 1 capsule (300 mg total) by mouth daily at 8 pm. 30 capsule 1  . Hydrocortisone (GERHARDT'S BUTT CREAM) CREA Apply 1 application topically 2 (two) times daily. 1 each 1  . loperamide (IMODIUM) 2 MG capsule Take 2 mg by mouth as needed for diarrhea or loose stools.    . methocarbamol (ROBAXIN) 500 MG tablet Take 1 tablet (500 mg total) by mouth every 6 (six) hours as needed for muscle spasms. 60 tablet 0  . metoprolol tartrate (LOPRESSOR) 25 MG tablet Take 1 tablet (25 mg total) by mouth at bedtime. 30 tablet 1  . Multiple Vitamins-Minerals (EYE VITAMINS PO) Take 1 tablet by mouth at bedtime.     Marland Kitchen oxyCODONE (OXY IR/ROXICODONE) 5 MG immediate release tablet Take 1 tablet (5 mg total) by mouth every 4 (four)  hours as needed for moderate pain, severe pain or breakthrough pain. 30 tablet 0  . pantoprazole (PROTONIX) 40 MG tablet Take 1 tablet (40 mg total) by mouth daily. 30 tablet 1  . potassium chloride (K-DUR,KLOR-CON) 10 MEQ tablet Take 1 tablet (10 mEq total) by mouth daily. 30 tablet 0  . valsartan-hydrochlorothiazide (DIOVAN-HCT) 160-12.5 MG tablet Take 0.5 tablets by mouth daily. 30 tablet 0    REVIEW OF SYSTEMS:  [X]  denotes positive finding, [ ]  denotes negative finding Cardiac  Comments:  Chest pain or chest pressure:    Shortness of breath upon exertion: X   Short of breath when lying flat:    Irregular heart rhythm:        Vascular    Pain in calf, thigh, or hip brought on by ambulation:    Pain in feet at night that wakes you up from your sleep:     Blood clot in your veins:    Leg swelling:         Pulmonary    Oxygen at home:    Productive cough:     Wheezing:         Neurologic    Sudden weakness in arms or legs:     Sudden numbness in arms or legs:     Sudden onset of difficulty speaking or slurred speech:    Temporary loss of vision in one eye:     Problems with dizziness:         Gastrointestinal    Blood in stool:     Vomited blood:         Genitourinary    Burning when urinating:     Blood in urine:  Psychiatric    Major depression:         Hematologic    Bleeding problems:    Problems with blood clotting too easily:        Skin    Rashes or ulcers:        Constitutional    Fever or chills:      PHYSICAL EXAM: Vitals:   01/13/17 1713 01/13/17 1735 01/13/17 1736 01/13/17 1915  BP:    171/72  Pulse:  76  86  Resp:  18  16  Temp:  97.6 F (36.4 C)    TempSrc:  Oral    SpO2: 97% 100%  99%  Weight:   113 lb (51.3 kg)   Height:   5\' 5"  (1.651 m)     GENERAL: The patient is a well-nourished female, in no acute distress. The vital signs are documented above. CARDIAC: There is a regular rate and rhythm.  VASCULAR: I do not detect  carotid bruits. She has a right femoral pulse. She has a left femoral pulse. I cannot palpate a popliteal or pedal pulses on the left. I cannot obtain Doppler signals in the left foot. The left foot is pale. She is able to move the foot but does have some paresthesias in the foot. PULMONARY: There is good air exchange bilaterally without wheezing or rales. ABDOMEN: Soft and non-tender with normal pitched bowel sounds.  MUSCULOSKELETAL: She has a right below the knee amputation. NEUROLOGIC: No focal weakness or paresthesias are detected. SKIN: There are no ulcers or rashes noted. PSYCHIATRIC: The patient has a normal affect.  DATA:   I have reviewed her most recent arteriogram. She has a patent below-knee popliteal artery although this artery is very small. She had two-vessel runoff via the peroneal and anterior tibial arteries although these vessels were very small.  MEDICAL ISSUES:  ISCHEMIC LEFT LOWER EXTREMITY: This is a frail 83 year old woman who is had 7 previous attempts at revascularization of the left lower extremity. She went home from rehabilitation today and developed paresthesias in the left leg. On exam she has an ischemic left foot. I'm unable to obtain Doppler signals in the left foot. She has had 7 previous attempts at revascularization on the left. She's had 2 operations in the left groin into operations below the knee. The only consideration would be a third time redo left femoral to below knee popliteal artery bypass which would be associated with significant risk given all of her previous procedures and her age. After extensive discussion with the family we agreed that we will admit her for heparinization and not proceed urgently with surgery. In my opinion any further surgery would be associated with significant risk with minimal chance for long-term success. The patient and her daughter seem to understand that. If her symptoms progress she will likely require primary amputation  on the left.   Deitra Mayo [facility] [phone]

## 2017-01-13 NOTE — ED Notes (Signed)
MD at bedside. 

## 2017-01-13 NOTE — Progress Notes (Signed)
ANTICOAGULATION CONSULT NOTE - Initial Consult  Pharmacy Consult for heparin Indication: ischemic leg  Allergies  Allergen Reactions  . Motrin [Ibuprofen] Other (See Comments)    ADVERSE REACTION - GI BLEED  . Statins Other (See Comments)    ADVERSE REACTION MUSCLE PAIN & WEAKNESS  . Morphine And Related Other (See Comments)    HALLUCINATIONS REACTION IS SIDE EFFECT  . Oxycontin [Oxycodone Hcl] Other (See Comments)    [REACTION IS SIDE EFFECT]  Hallinatations  . Promethazine Hcl Other (See Comments)    IV  Drug only, makes her act crazy  . Sulfa Antibiotics Nausea And Vomiting    Patient Measurements: Height: 5\' 5"  (165.1 cm) Weight: 113 lb (51.3 kg) IBW/kg (Calculated) : 57 Heparin Dosing Weight: 51 kg  Vital Signs: Temp: 97.6 F (36.4 C) (02/27 1735) Temp Source: Oral (02/27 1735) BP: 171/72 (02/27 1915) Pulse Rate: 86 (02/27 1915)  Labs:  Recent Labs  01/12/17 0354 01/13/17 0540  HGB 9.5*  --   HCT 29.9*  --   PLT 280  --   CREATININE 1.00 1.00    Estimated Creatinine Clearance: 32.7 mL/min (by C-G formula based on SCr of 1 mg/dL).   Medical History: Past Medical History:  Diagnosis Date  . Anemia   . Anginal pain ([place])   . Arthritis    "qwhere" (09/05/2016)  . Atrial fibrillation ([place]) 09/2016  . Chronic lower back pain   . Complication of anesthesia    "takes a long time for it to wear off; I can hallucinate if I take too much" (09/05/2016)  . DVT (deep venous thrombosis) ([place]) 10/2009  . Fall from steps 08/31/2013   Fx. pelvis, Left Hip, Left Elbow  . Fibromyalgia   . GERD (gastroesophageal reflux disease)    09/22/16- "no too much anymore"  . GI bleed 10/24/2016  . High cholesterol   . History of blood transfusion   . History of hiatal hernia   . Hypertension   . Macular degeneration, wet ([place])    "started in right eye; now legally blind in that eye; now started in left eye but pretty much in control" (09/05/2016)  . Osteoporosis   .  Peripheral vascular disease ([place])    nonviable tissue Right foot  . PONV (postoperative nausea and vomiting)   . Squamous cell carcinoma of skin of right calf Aug. 2015  . Stroke ([place])    TIA's no residual  . TIA (transient ischemic attack)    "several at once; none in a long time" (09/05/2016)    Assessment: 5 yoF with significant PVD now s/p R BKA presents s/p fall with subsequent numbness in L foot. Per vascular, pt has extensive history of interventions on L leg (most recent 09/2016). After discussions with family, plan is to initiate heparin per pharmacy and not proceed urgently with surgery at this time given age and previous procedures. Pt is on no OAC PTA (received Lovenox for DVT ppx this morning), Hgb low but appears close to baseline - will dose conservatively given age and low weight.  Goal of Therapy:  Heparin level 0.3-0.7 units/ml Monitor platelets by anticoagulation protocol: Yes   Plan:  -Heparin 2000 units x1 -Heparin 700 units/hr -Check 8-hr heparin level -Monitor heparin level, CBC, S/Sx bleeding daily  Arrie Senate, PharmD PGY-1 Pharmacy Resident Pager: [phone] 01/13/2017

## 2017-01-13 NOTE — Progress Notes (Signed)
Subjective/Complaints: No stump or phantom pain Slept ok ROS- no N/V/D  Objective: Vital Signs: Blood pressure (!) 151/58, pulse 65, temperature 98.7 F (37.1 C), temperature source Oral, resp. rate 18, height _0  (1.575 m), weight 51.3 kg (113 lb), SpO2 98 %. No results found. Results for orders placed or performed during the hospital encounter of 12/30/16 (from the past 72 hour(s))  CBC     Status: Abnormal   Collection Time: 01/12/17  3:54 AM  Result Value Ref Range   WBC 6.1 4.0 - 10.5 K/uL   RBC 3.29 (L) 3.87 - 5.11 MIL/uL   Hemoglobin 9.5 (L) 12.0 - 15.0 g/dL   HCT 29.9 (L) 36.0 - 46.0 %   MCV 90.9 78.0 - 100.0 fL   MCH 28.9 26.0 - 34.0 pg   MCHC 31.8 30.0 - 36.0 g/dL   RDW 15.8 (H) 11.5 - 15.5 %   Platelets 280 150 - 400 K/uL  Basic metabolic panel     Status: Abnormal   Collection Time: 01/12/17  3:54 AM  Result Value Ref Range   Sodium 135 135 - 145 mmol/L   Potassium 3.3 (L) 3.5 - 5.1 mmol/L   Chloride 101 101 - 111 mmol/L   CO2 25 22 - 32 mmol/L   Glucose, Bld 96 65 - 99 mg/dL   BUN 15 6 - 20 mg/dL   Creatinine, Ser 1.00 0.44 - 1.00 mg/dL   Calcium 9.5 8.9 - 10.3 mg/dL   GFR calc non Af Amer 50 (L) >60 mL/min   GFR calc Af Amer 57 (L) >60 mL/min    Comment: (NOTE) The eGFR has been calculated using the CKD EPI equation. This calculation has not been validated in all clinical situations. eGFR's persistently <60 mL/min signify possible Chronic Kidney Disease.    Anion gap 9 5 - 15     HEENT: normal Cardio: RRR Resp: CTA B/L GI: BS positive and NT, ND Extremity:  Edema R Stump Skin:   Wound unchanged in appearance since 2/21 Neuro: Alert/Oriented and Abnormal Motor RIght knee 3- pain inhibition Musc/Skel:  Other R BKA Gen NAD   Assessment/Plan: 1. Functional deficits secondary to R BKA  Stable for D/C today F/u PCP in 3-4 weeks F/u PM&R 2 weeks See D/C summary See D/C instructions FIM: Function - Bathing Position: Shower Body parts bathed  by patient: Right arm, Left arm, Chest, Abdomen, Front perineal area, Right upper leg, Left upper leg, Buttocks, Back Body parts bathed by helper: Left lower leg Bathing not applicable: Right lower leg Assist Level: Touching or steadying assistance(Pt > 75%)  Function- Upper Body Dressing/Undressing What is the patient wearing?: Pull over shirt/dress Pull over shirt/dress - Perfomed by patient: Thread/unthread left sleeve, Pull shirt over trunk, Put head through opening, Thread/unthread right sleeve Pull over shirt/dress - Perfomed by helper: Put head through opening Assist Level: More than reasonable time Set up : To obtain clothing/put away Function - Lower Body Dressing/Undressing What is the patient wearing?: Pants, Shoes Position: Wheelchair/chair at sink Pants- Performed by patient: Thread/unthread right pants leg, Pull pants up/down Pants- Performed by helper: Thread/unthread left pants leg Non-skid slipper socks- Performed by helper: Don/doff left sock Shoes - Performed by patient: Don/doff left shoe (R N/A) Assist for footwear: Setup Assist for lower body dressing: Touching or steadying assistance (Pt > 75%)  Function - Toileting Toileting activity did not occur: Refused Toileting steps completed by patient: Adjust clothing prior to toileting, Performs perineal hygiene, Adjust clothing after toileting  Toileting steps completed by helper: Adjust clothing prior to toileting, Adjust clothing after toileting Toileting Assistive Devices: Grab bar or rail Assist level: Touching or steadying assistance (Pt.75%)  Function - Air cabin crew transfer activity did not occur: Refused Toilet transfer assistive device: Elevated toilet seat/BSC over toilet, Sliding board Assist level to toilet: Touching or steadying assistance (Pt > 75%) Assist level from toilet: Touching or steadying assistance (Pt > 75%) Assist level to bedside commode (at bedside): Touching or steadying  assistance (Pt > 75%) Assist level from bedside commode (at bedside): Touching or steadying assistance (Pt > 75%)  Function - Chair/bed transfer Chair/bed transfer method: Lateral scoot Chair/bed transfer assist level: Supervision or verbal cues Chair/bed transfer assistive device: Sliding board, Armrests Chair/bed transfer details: Verbal cues for precautions/safety, Verbal cues for technique  Function - Locomotion: Wheelchair Will patient use wheelchair at discharge?: Yes Type: Manual Max wheelchair distance: 100 Assist Level: Supervision or verbal cues Assist Level: Supervision or verbal cues Wheel 150 feet activity did not occur: Safety/medical concerns Assist Level: Supervision or verbal cues Turns around,maneuvers to table,bed, and toilet,negotiates 3% grade,maneuvers on rugs and over doorsills: No Function - Locomotion: Ambulation Ambulation activity did not occur: Safety/medical concerns Walk 10 feet activity did not occur: Safety/medical concerns Walk 50 feet with 2 turns activity did not occur: Safety/medical concerns Walk 150 feet activity did not occur: Safety/medical concerns Walk 10 feet on uneven surfaces activity did not occur: Safety/medical concerns  Function - Comprehension Comprehension: Auditory Comprehension assist level: Follows complex conversation/direction with no assist  Function - Expression Expression: Verbal Expression assist level: Expresses complex ideas: With no assist  Function - Social Interaction Social Interaction assist level: Interacts appropriately with others - No medications needed.  Function - Problem Solving Problem solving assist level: Solves complex problems: Recognizes & self-corrects  Function - Memory Memory assist level: Complete Independence: No helper Patient normally able to recall (first 3 days only): That he or she is in a hospital, Location of own room, Staff names and faces, Current season  Medical Problem List and  Plan: 1. Decreased functional mobilitysecondary to right BKA 12/23/2016 -plan d/c today 2. DVT Prophylaxis/Anticoagulation: Sq lovenox 3. Pain Management/fibromyalgia: Neurontin 600 mg 3 times a day----has used this long term at home for neuropathic pain in legs---decreased on 2/15 to only 369m qhs  -continue Duragesic patch 12.5 g, oxycodone as needed  -continue robaxin for spasm  -continue prn low dose oral dilaudid for severe pain    4. Mood: Valium 2.5 mg daily at bedtime as needed anxiety 5. Neuropsych: This patient iscapable of making decisions on herown behalf. 6. Skin/Wound Care: wound stable to improved. Some of incision margin may actually be viable  -continue oil emersion dressing ,gauze,kerlix dressing daily with ACE  -ok for shrinker   -continue air mattress to allow improvement of skin in sacral area   -keep area dry/gerhardt's cream 7. Fluids/Electrolytes/Nutrition: y  -appreciate RD assist  -appetite showing some improvement  -recheck bmet low K,3.3 on 2/26 8.Acute blood loss anemia. Follow-up CBC with hgb back up to 9.5K on 2/26   -continue to follow clnically---   9.Atrial fibrillation. Lopressor 50 mg daily and 25 mg daily at bedtime. Cardiac rate control. Follow-up per cardiology services 10.Hypertension. Avapro 75 mg daily, HCTZ 6.25 mg daily. Reasonable control at present 11.CRI stage III. Creatinine baseline 1.07-1.19. Cr 0.94   12.History of GI bleed. Continue Protonix 13.Constipation. Laxative assistance 15. Hx of right elbow replacement 2015---5 lb weight limit through  elbow LOS (Days) 14 A FACE TO FACE EVALUATION WAS PERFORMED  Desmin Daleo E 01/13/2017, 6:58 AM

## 2017-01-13 NOTE — ED Triage Notes (Signed)
Per EMS pt from home was discharged this morning been here for weeks. Amputated R side, vascular issues. Today pt was transferring from chair to wheelchair. L ankle twisted and pt has pain, no swelling, foot is cold has pulses. VS 136/76 pulse 82, RR 16, 97%

## 2017-01-13 NOTE — ED Notes (Signed)
Floor nurse notified of unsuccessful IV attempt. IV consult placed

## 2017-01-13 NOTE — Progress Notes (Signed)
Social Work  Discharge Note  The overall goal for the admission was met for:   Discharge location: Yes - home with spouse and family providing 24/7 assist  Length of Stay: Yes - 14 days  Discharge activity level: Yes - min assist overall  Home/community participation: Yes  Services provided included: MD, RD, PT, OT, RN, TR, Pharmacy, [place]: Medicare and Private Insurance: [place]  Follow-up services arranged: Home Health: Therapist, sports, PT, OT via [place] ([place] case), DME: 16x16 lightweight w/c with right amp support pad, Jay 2 cushion, drop arm commoe, tub bench and 24" transfer board all via [place] and Patient/Family has no preference for HH/DME agencies  Comments (or additional information):  Patient/Family verbalized understanding of follow-up arrangements: Yes  Individual responsible for coordination of the follow-up plan: pt  Confirmed correct DME delivered: Tykeshia Tourangeau 01/13/2017    Brigitta Pricer

## 2017-01-14 ENCOUNTER — Encounter (HOSPITAL_COMMUNITY): Payer: Self-pay

## 2017-01-14 ENCOUNTER — Telehealth: Payer: Self-pay | Admitting: *Deleted

## 2017-01-14 ENCOUNTER — Inpatient Hospital Stay (HOSPITAL_COMMUNITY): Payer: Medicare Other

## 2017-01-14 DIAGNOSIS — I739 Peripheral vascular disease, unspecified: Secondary | ICD-10-CM

## 2017-01-14 LAB — CBC
HCT: 30.6 % — ABNORMAL LOW (ref 36.0–46.0)
HEMATOCRIT: 29.9 % — AB (ref 36.0–46.0)
Hemoglobin: 9.5 g/dL — ABNORMAL LOW (ref 12.0–15.0)
Hemoglobin: 9.7 g/dL — ABNORMAL LOW (ref 12.0–15.0)
MCH: 28.9 pg (ref 26.0–34.0)
MCH: 29 pg (ref 26.0–34.0)
MCHC: 31.7 g/dL (ref 30.0–36.0)
MCHC: 31.8 g/dL (ref 30.0–36.0)
MCV: 90.9 fL (ref 78.0–100.0)
MCV: 91.6 fL (ref 78.0–100.0)
PLATELETS: 270 10*3/uL (ref 150–400)
PLATELETS: 241 10*3/uL (ref 150–400)
RBC: 3.29 MIL/uL — ABNORMAL LOW (ref 3.87–5.11)
RBC: 3.34 MIL/uL — AB (ref 3.87–5.11)
RDW: 16 % — ABNORMAL HIGH (ref 11.5–15.5)
RDW: 16.1 % — AB (ref 11.5–15.5)
WBC: 6.7 10*3/uL (ref 4.0–10.5)
WBC: 8.3 10*3/uL (ref 4.0–10.5)

## 2017-01-14 LAB — URINALYSIS, ROUTINE W REFLEX MICROSCOPIC
BILIRUBIN URINE: NEGATIVE
Glucose, UA: NEGATIVE mg/dL
Ketones, ur: 5 mg/dL — AB
Nitrite: NEGATIVE
PH: 5 (ref 5.0–8.0)
Protein, ur: NEGATIVE mg/dL
SPECIFIC GRAVITY, URINE: 1.017 (ref 1.005–1.030)
Squamous Epithelial / LPF: NONE SEEN

## 2017-01-14 LAB — BASIC METABOLIC PANEL
Anion gap: 9 (ref 5–15)
BUN: 16 mg/dL (ref 6–20)
CHLORIDE: 104 mmol/L (ref 101–111)
CO2: 22 mmol/L (ref 22–32)
CREATININE: 1.02 mg/dL — AB (ref 0.44–1.00)
Calcium: 9.3 mg/dL (ref 8.9–10.3)
GFR calc non Af Amer: 48 mL/min — ABNORMAL LOW (ref >60)
GFR, EST AFRICAN AMERICAN: 56 mL/min — AB (ref >60)
Glucose, Bld: 97 mg/dL (ref 65–99)
Potassium: 4 mmol/L (ref 3.5–5.1)
SODIUM: 135 mmol/L (ref 135–145)

## 2017-01-14 LAB — PROTIME-INR
INR: 1.01
PROTHROMBIN TIME: 13.3 s (ref 11.4–15.2)

## 2017-01-14 LAB — HEPARIN LEVEL (UNFRACTIONATED)
HEPARIN UNFRACTIONATED: 0.43 [IU]/mL (ref 0.30–0.70)
Heparin Unfractionated: 0.52 IU/mL (ref 0.30–0.70)

## 2017-01-14 MED ORDER — HYDROMORPHONE HCL 1 MG/ML IJ SOLN
1.0000 mg | INTRAMUSCULAR | Status: DC | PRN
  Administered 2017-01-14 – 2017-01-20 (×34): 1 mg via INTRAVENOUS
  Filled 2017-01-14 (×35): qty 1

## 2017-01-14 MED ORDER — IOPAMIDOL (ISOVUE-370) INJECTION 76%
INTRAVENOUS | Status: AC
  Administered 2017-01-14: 100 mL
  Filled 2017-01-14: qty 100

## 2017-01-14 NOTE — Progress Notes (Signed)
Spoke with patient and daughter today.  She recently was discharged from rehab after a right BKA, and while at home developed left leg numbness such that she can not use that leg for transfers.  It is numb and painful.  A duplex u/s was done which shows her left leg interventions have occluded.  I presented all options for treatment with them.  WE have decided to get a CTA to better define her anatomy.  If she is a candidate, we will proceed with left leg bypass on Friday.  Currently, her foot remains viable.  We will continue her heparin drip and stop her Plavix.   WElls Shervon Kerwin

## 2017-01-14 NOTE — Telephone Encounter (Signed)
Received call from daughter on clinic line reporting that [name] had- upon returning home after hospital discharge- stood up with transfer and her leg collapsed with her. She reports increased pain. When the call was retrieved it showed that [name] had gone to [facility] ED and has been readmitted.  Dr Naaman Plummer is out of office until 01/19/17 and will be relayed to him.

## 2017-01-14 NOTE — Progress Notes (Signed)
*  PRELIMINARY RESULTS* Vascular Ultrasound Lower Extremity Arterial Duplex has been completed.  Preliminary findings: No arterial flow detected in the left common femoral bypass, no flow detected in the popliteal artery or in the left calf.   Preliminary results called to Juliette Mangle and to Laurence Slate via [place] @ 14:35. Everrett Coombe 01/14/2017, 2:33 PM

## 2017-01-14 NOTE — Care Management Note (Signed)
Case Management Note [name] RN, BSN Unit 2W-Case Manager [phone]  Patient Details  Name: [name] MRN: [id number] Date of Birth: 1928-06-02  Subjective/Objective:   Pt admitted with  Ischemic left leg- recent BKA of right leg                Action/Plan: PTA pt had just gone home from [facility] IP rehab on 01/13/17- now readmitted- has supportive family- from home with spouse- CM will follow for d/c needs pending pt's progress and plan on this admit.   Expected Discharge Date:                  Expected Discharge Plan:  Skilled Nursing Facility  In-House Referral:  Clinical Social Work  Discharge planning Services  CM Consult  Post Acute Care Choice:    Choice offered to:     DME Arranged:    DME Agency:     HH Arranged:    [place] Agency:     Status of Service:  In process, will continue to follow  If discussed at Long Length of Stay Meetings, dates discussed:    Additional Comments:  Karen Ahmaya, RN 01/14/2017, 4:23 PM

## 2017-01-14 NOTE — Progress Notes (Signed)
  [facility] Progress Note  Subjective   Left leg painful but is controlled on current pain regimen.   Objective Vitals:   01/13/17 2308 01/14/17 0543  BP: (!) 152/52 (!) 161/81  Pulse: 79 80  Resp: 20 20  Temp: 98.1 F (36.7 C) 98.6 F (37 C)    Intake/Output Summary (Last 24 hours) at 01/14/17 1134 Last data filed at 01/14/17 0419  Gross per 24 hour  Intake            28.47 ml  Output                0 ml  Net            28.47 ml   Left foot is slightly cool. Wiggling left toes. Diminished sensation left foot.  Right BKA in shrinker.   Assessment/Planning: 83 y.o. female with ischemic left leg  Continue heparin for now.  Patient and daughter currently do not want any further intervention but want to discuss with [name] first.  Pain adequately controlled.   Alvia Grove 01/14/2017 11:34 AM --  Laboratory CBC    Component Value Date/Time   WBC 8.3 01/14/2017 0237   HGB 9.5 (L) 01/14/2017 0237   HCT 29.9 (L) 01/14/2017 0237   PLT 270 01/14/2017 0237    BMET    Component Value Date/Time   NA 135 01/14/2017 0002   K 4.0 01/14/2017 0002   CL 104 01/14/2017 0002   CO2 22 01/14/2017 0002   GLUCOSE 97 01/14/2017 0002   BUN 16 01/14/2017 0002   CREATININE 1.02 (H) 01/14/2017 0002   CALCIUM 9.3 01/14/2017 0002   GFRNONAA 48 (L) 01/14/2017 0002   GFRAA 56 (L) 01/14/2017 0002    COAG Lab Results  Component Value Date   INR 1.01 01/14/2017   INR 1.14 12/23/2016   INR 1.00 10/23/2016   No results found for: PTT  Antibiotics Anti-infectives    None       [name], PA-C [facility] Office: [phone] Pager: [phone] 01/14/2017 11:34 AM

## 2017-01-14 NOTE — Progress Notes (Addendum)
[place] for heparin Indication: ischemic leg  Allergies  Allergen Reactions  . Motrin [Ibuprofen] Other (See Comments)    ADVERSE REACTION - GI BLEED  . Statins Other (See Comments)    ADVERSE REACTION MUSCLE PAIN & WEAKNESS  . Morphine And Related Other (See Comments)    HALLUCINATIONS REACTION IS SIDE EFFECT  . Oxycontin [Oxycodone Hcl] Other (See Comments)    [REACTION IS SIDE EFFECT]  Hallinatations  . Promethazine Hcl Other (See Comments)    IV  Drug only, makes her act crazy  . Sulfa Antibiotics Nausea And Vomiting    Patient Measurements: Height: 5\' 2"  (157.5 cm) Weight: 115 lb 11.9 oz (52.5 kg) IBW/kg (Calculated) : 50.1 Heparin Dosing Weight: 51 kg  Vital Signs: Temp: 98.6 F (37 C) (02/28 0543) Temp Source: Oral (02/28 0543) BP: 161/81 (02/28 0543) Pulse Rate: 80 (02/28 0543)  Labs:  Recent Labs  01/12/17 0354 01/13/17 0540 01/14/17 0002 01/14/17 0237 01/14/17 0636  HGB 9.5*  --  9.7* 9.5*  --   HCT 29.9*  --  30.6* 29.9*  --   PLT 280  --  241 270  --   LABPROT  --   --  13.3  --   --   INR  --   --  1.01  --   --   HEPARINUNFRC  --   --   --   --  0.52  CREATININE 1.00 1.00 1.02*  --   --     Estimated Creatinine Clearance: 31.3 mL/min (by C-G formula based on SCr of 1.02 mg/dL (H)).   Medical History: Past Medical History:  Diagnosis Date  . Anemia   . Anginal pain ([place])   . Arthritis    "qwhere" (09/05/2016)  . Atrial fibrillation ([place]) 09/2016  . Chronic lower back pain   . Complication of anesthesia    "takes a long time for it to wear off; I can hallucinate if I take too much" (09/05/2016)  . DVT (deep venous thrombosis) ([place]) 10/2009  . Fall from steps 08/31/2013   Fx. pelvis, Left Hip, Left Elbow  . Fibromyalgia   . GERD (gastroesophageal reflux disease)    09/22/16- "no too much anymore"  . GI bleed 10/24/2016  . High cholesterol   . History of blood transfusion   . History of hiatal  hernia   . Hypertension   . Macular degeneration, wet ([place])    "started in right eye; now legally blind in that eye; now started in left eye but pretty much in control" (09/05/2016)  . Osteoporosis   . Peripheral vascular disease ([place])    nonviable tissue Right foot  . PONV (postoperative nausea and vomiting)   . Squamous cell carcinoma of skin of right calf Aug. 2015  . Stroke ([place])    TIA's no residual  . TIA (transient ischemic attack)    "several at once; none in a long time" (09/05/2016)    Assessment: 57 yoF with significant PVD now s/p R BKA presents s/p fall with subsequent numbness in L foot. Per vascular, pt has extensive history of interventions on L leg (most recent 09/2016). Pharmacy consulted to dose heparin. Plans noted for possible amputation -heparin level= 0.52  Goal of Therapy:  Heparin level 0.3-0.7 units/ml Monitor platelets by anticoagulation protocol: Yes   Plan:  -No heparin changes needed -Will confirm heparin level today -Will follow procedural plans  Hildred Laser, Pharm D 01/14/2017 7:44 AM  Addendum -heparin  level confirmed at goal  Plan -No heparin changes needed  Hildred Laser, Sherian Rein D 01/14/2017 12:21 PM

## 2017-01-15 LAB — CBC
HCT: 32.4 % — ABNORMAL LOW (ref 36.0–46.0)
Hemoglobin: 10.1 g/dL — ABNORMAL LOW (ref 12.0–15.0)
MCH: 28.7 pg (ref 26.0–34.0)
MCHC: 31.2 g/dL (ref 30.0–36.0)
MCV: 92 fL (ref 78.0–100.0)
PLATELETS: 284 10*3/uL (ref 150–400)
RBC: 3.52 MIL/uL — ABNORMAL LOW (ref 3.87–5.11)
RDW: 16.3 % — AB (ref 11.5–15.5)
WBC: 7.6 10*3/uL (ref 4.0–10.5)

## 2017-01-15 LAB — HEPARIN LEVEL (UNFRACTIONATED): HEPARIN UNFRACTIONATED: 0.38 [IU]/mL (ref 0.30–0.70)

## 2017-01-15 MED ORDER — DEXTROSE 5 % IV SOLN
1.5000 g | INTRAVENOUS | Status: AC
  Administered 2017-01-16: 1.5 g via INTRAVENOUS
  Filled 2017-01-15 (×2): qty 1.5

## 2017-01-15 NOTE — Progress Notes (Signed)
Patient daughter question why nurse have to flush IV Dilaudid with Normal Saline that it dilutes the effectiveness of the medication. Nurse attempted to explain to daughter that effect will be the same.

## 2017-01-15 NOTE — Progress Notes (Addendum)
[facility] [place]  Subjective  - Rested some last night.   Objective (!) 178/55 78 98.1 F (36.7 C) (Oral) 18 96% No intake or output data in the 24 hours ending 01/15/17 0714  Minimal active range of motion left foot and toes. Left foot cool touch   Assessment/Planning: POD # Occluded Left LE by pass Comfortable over all with PO pain medication Continue heparin drip Pre-op for left re do fem-distal target by Fae Pippin, EMMA [facility] 01/15/2017 7:14 AM --  Laboratory Lab Results:  Recent Labs  01/14/17 0237 01/15/17 0340  WBC 8.3 7.6  HGB 9.5* 10.1*  HCT 29.9* 32.4*  PLT 270 284   BMET  Recent Labs  01/13/17 0540 01/14/17 0002  NA  --  135  K  --  4.0  CL  --  104  CO2  --  22  GLUCOSE  --  97  BUN  --  16  CREATININE 1.00 1.02*  CALCIUM  --  9.3    COAG Lab Results  Component Value Date   INR 1.01 01/14/2017   INR 1.14 12/23/2016   INR 1.00 10/23/2016   No results found for: PTT   Agree with the above.  I have seen and examined the patient.  Her left foot remains viable.  I have reviewed her CTA and will plan for a femoral to AT bypass with PTFE tomorrow.  I discussed this with the patient and daughter and they wish to proceed.  [name]

## 2017-01-15 NOTE — Consult Note (Signed)
   [facility] CM Inpatient Consult   01/15/2017  [name] 10/26/1928 [id number]   Chart reviewed for re-admission.  This is the 5th hospitalization in 6 months which includes an inpatient rehab stay recently.  The chart review reveals the patient is [name] is a 83 y.o. female, who was just discharged from [facility] inpatient rehabilitation the day of admission after undergoing a right below the knee amputation. She had multiple revascularization attempts on the right side but ultimately required a below-the-knee amputation. While at home she developed left leg numbness such that she can not use that leg for transfers.  It is numb and painful.  A duplex u/s was done which shows her left leg interventions have occluded per MDs notes. Patient is eligible for [facility] Care Management with the Medicare ACO Registry.  Will follow for progress and disposition and determine needs for post hospital care management needs. Spoke with inpatient RNCM Debbie Swist about patient being eligible. For questions, please contact:  Natividad Brood, RN BSN [place] [facility] Liaison  [phone] business mobile phone Toll free office [phone]

## 2017-01-15 NOTE — Progress Notes (Addendum)
[place] for heparin Indication: ischemic leg  Allergies  Allergen Reactions  . Motrin [Ibuprofen] Other (See Comments)    ADVERSE REACTION - GI BLEED  . Statins Other (See Comments)    ADVERSE REACTION MUSCLE PAIN & WEAKNESS  . Morphine And Related Other (See Comments)    HALLUCINATIONS REACTION IS SIDE EFFECT  . Oxycontin [Oxycodone Hcl] Other (See Comments)    [REACTION IS SIDE EFFECT]  > HALLUCINATIONS  . Promethazine Hcl Other (See Comments)    IV  Drug only, makes her act crazy  . Sulfa Antibiotics Nausea And Vomiting    Patient Measurements: Height: 5\' 2"  (157.5 cm) Weight: 113 lb 15.7 oz (51.7 kg) IBW/kg (Calculated) : 50.1 Heparin Dosing Weight: 51 kg  Vital Signs: Temp: 98.1 F (36.7 C) (03/01 0502) Temp Source: Oral (03/01 0502) BP: 178/55 (03/01 0502) Pulse Rate: 78 (03/01 0502)  Labs:  Recent Labs  01/13/17 0540  01/14/17 0002 01/14/17 0237 01/14/17 0636 01/14/17 1054 01/15/17 0340  HGB  --   < > 9.7* 9.5*  --   --  10.1*  HCT  --   --  30.6* 29.9*  --   --  32.4*  PLT  --   --  241 270  --   --  284  LABPROT  --   --  13.3  --   --   --   --   INR  --   --  1.01  --   --   --   --   HEPARINUNFRC  --   --   --   --  0.52 0.43 0.38  CREATININE 1.00  --  1.02*  --   --   --   --   < > = values in this interval not displayed.  Estimated Creatinine Clearance: 31.3 mL/min (by C-G formula based on SCr of 1.02 mg/dL (H)).   Medical History: Past Medical History:  Diagnosis Date  . Anemia   . Anginal pain ([place])   . Arthritis    "qwhere" (09/05/2016)  . Atrial fibrillation ([place]) 09/2016  . Chronic lower back pain   . Complication of anesthesia    "takes a long time for it to wear off; I can hallucinate if I take too much" (09/05/2016)  . DVT (deep venous thrombosis) ([place]) 10/2009  . Fall from steps 08/31/2013   Fx. pelvis, Left Hip, Left Elbow  . Fibromyalgia   . GERD (gastroesophageal reflux disease)     09/22/16- "no too much anymore"  . GI bleed 10/24/2016  . High cholesterol   . History of blood transfusion   . History of hiatal hernia   . Hypertension   . Macular degeneration, wet ([place])    "started in right eye; now legally blind in that eye; now started in left eye but pretty much in control" (09/05/2016)  . Osteoporosis   . Peripheral vascular disease ([place])    nonviable tissue Right foot  . PONV (postoperative nausea and vomiting)   . Squamous cell carcinoma of skin of right calf Aug. 2015  . Stroke ([place])    TIA's no residual  . TIA (transient ischemic attack)    "several at once; none in a long time" (09/05/2016)    Assessment: 82 yoF with significant PVD now s/p R BKA presents s/p fall with subsequent numbness in L foot. Per vascular, pt has extensive history of interventions on L leg (most recent 09/2016). Pharmacy consulted to dose  heparin. Plans noted for possible  left leg bypass on Friday.  -heparin level= 0.38  Goal of Therapy:  Heparin level 0.3-0.7 units/ml Monitor platelets by anticoagulation protocol: Yes   Plan:  -No heparin changes needed -Daily heparin level and CBC  Hildred Laser, Pharm D 01/15/2017 9:01 AM

## 2017-01-16 ENCOUNTER — Inpatient Hospital Stay (HOSPITAL_COMMUNITY): Payer: Medicare Other | Admitting: Certified Registered Nurse Anesthetist

## 2017-01-16 ENCOUNTER — Encounter (HOSPITAL_COMMUNITY)
Admission: EM | Disposition: A | Discharge status: Skilled Nursing Facility | Payer: Self-pay | Source: Home / Self Care | Attending: Vascular Surgery

## 2017-01-16 DIAGNOSIS — I70322 Atherosclerosis of unspecified type of bypass graft(s) of the extremities with rest pain, left leg: Secondary | ICD-10-CM

## 2017-01-16 HISTORY — PX: FEMORAL-TIBIAL BYPASS GRAFT: SHX938

## 2017-01-16 HISTORY — PX: DRESSING CHANGE UNDER ANESTHESIA: SHX5237

## 2017-01-16 LAB — CBC
HCT: 31.6 % — ABNORMAL LOW (ref 36.0–46.0)
HCT: 29.2 % — ABNORMAL LOW (ref 36.0–46.0)
HEMOGLOBIN: 10.1 g/dL — AB (ref 12.0–15.0)
HEMOGLOBIN: 9.2 g/dL — AB (ref 12.0–15.0)
MCH: 29.4 pg (ref 26.0–34.0)
MCH: 28.5 pg (ref 26.0–34.0)
MCHC: 32 g/dL (ref 30.0–36.0)
MCHC: 31.5 g/dL (ref 30.0–36.0)
MCV: 91.9 fL (ref 78.0–100.0)
MCV: 90.4 fL (ref 78.0–100.0)
PLATELETS: 252 10*3/uL (ref 150–400)
PLATELETS: 201 10*3/uL (ref 150–400)
RBC: 3.44 MIL/uL — ABNORMAL LOW (ref 3.87–5.11)
RBC: 3.23 MIL/uL — AB (ref 3.87–5.11)
RDW: 16.6 % — ABNORMAL HIGH (ref 11.5–15.5)
RDW: 16 % — ABNORMAL HIGH (ref 11.5–15.5)
WBC: 9.1 10*3/uL (ref 4.0–10.5)
WBC: 10 10*3/uL (ref 4.0–10.5)

## 2017-01-16 LAB — HEPARIN LEVEL (UNFRACTIONATED): Heparin Unfractionated: 0.38 IU/mL (ref 0.30–0.70)

## 2017-01-16 LAB — CREATININE, SERUM
CREATININE: 0.83 mg/dL (ref 0.44–1.00)
GFR calc Af Amer: >60 mL/min (ref >60)

## 2017-01-16 LAB — BASIC METABOLIC PANEL
ANION GAP: 11 (ref 5–15)
BUN: 17 mg/dL (ref 6–20)
CALCIUM: 10 mg/dL (ref 8.9–10.3)
CO2: 25 mmol/L (ref 22–32)
Chloride: 95 mmol/L — ABNORMAL LOW (ref 101–111)
Creatinine, Ser: 0.94 mg/dL (ref 0.44–1.00)
GFR, EST NON AFRICAN AMERICAN: 53 mL/min — AB (ref >60)
Glucose, Bld: 111 mg/dL — ABNORMAL HIGH (ref 65–99)
Potassium: 4.1 mmol/L (ref 3.5–5.1)
Sodium: 131 mmol/L — ABNORMAL LOW (ref 135–145)

## 2017-01-16 LAB — PROTIME-INR
INR: 1.06
PROTHROMBIN TIME: 13.8 s (ref 11.4–15.2)

## 2017-01-16 SURGERY — CREATION, BYPASS, ARTERIAL, FEMORAL TO TIBIAL, USING GRAFT
Anesthesia: General | Site: Leg Upper | Laterality: Right

## 2017-01-16 MED ORDER — PROPOFOL 10 MG/ML IV BOLUS
INTRAVENOUS | Status: AC
  Filled 2017-01-16: qty 20

## 2017-01-16 MED ORDER — DEXAMETHASONE SODIUM PHOSPHATE 10 MG/ML IJ SOLN
INTRAMUSCULAR | Status: DC | PRN
  Administered 2017-01-16: 10 mg via INTRAVENOUS

## 2017-01-16 MED ORDER — HEPARIN SODIUM (PORCINE) 1000 UNIT/ML IJ SOLN
INTRAMUSCULAR | Status: DC | PRN
  Administered 2017-01-16: 5000 [IU] via INTRAVENOUS

## 2017-01-16 MED ORDER — HEMOSTATIC AGENTS (NO CHARGE) OPTIME
TOPICAL | Status: DC | PRN
  Administered 2017-01-16: 2 via TOPICAL

## 2017-01-16 MED ORDER — LIDOCAINE 2% (20 MG/ML) 5 ML SYRINGE
INTRAMUSCULAR | Status: DC | PRN
  Administered 2017-01-16: 40 mg via INTRAVENOUS

## 2017-01-16 MED ORDER — ROCURONIUM BROMIDE 100 MG/10ML IV SOLN
INTRAVENOUS | Status: DC | PRN
  Administered 2017-01-16: 50 mg via INTRAVENOUS
  Administered 2017-01-16 (×3): 10 mg via INTRAVENOUS

## 2017-01-16 MED ORDER — SODIUM CHLORIDE 0.9 % IV SOLN
INTRAVENOUS | Status: DC
  Administered 2017-01-16 (×2): via INTRAVENOUS

## 2017-01-16 MED ORDER — LACTATED RINGERS IV SOLN
INTRAVENOUS | Status: DC | PRN
  Administered 2017-01-16: 16:00:00 via INTRAVENOUS

## 2017-01-16 MED ORDER — 0.9 % SODIUM CHLORIDE (POUR BTL) OPTIME
TOPICAL | Status: DC | PRN
  Administered 2017-01-16: 2000 mL

## 2017-01-16 MED ORDER — FENTANYL CITRATE (PF) 100 MCG/2ML IJ SOLN
25.0000 ug | INTRAMUSCULAR | Status: DC | PRN
  Administered 2017-01-17: 25 ug via INTRAVENOUS
  Filled 2017-01-16: qty 2

## 2017-01-16 MED ORDER — SODIUM CHLORIDE 0.9 % IV SOLN
INTRAVENOUS | Status: DC
  Administered 2017-01-16 – 2017-01-17 (×2): via INTRAVENOUS

## 2017-01-16 MED ORDER — POTASSIUM CHLORIDE CRYS ER 20 MEQ PO TBCR: 20.0000 meq | EXTENDED_RELEASE_TABLET | Freq: Every day | ORAL | Status: DC | PRN

## 2017-01-16 MED ORDER — PROTAMINE SULFATE 10 MG/ML IV SOLN
INTRAVENOUS | Status: AC
  Filled 2017-01-16: qty 5

## 2017-01-16 MED ORDER — SODIUM CHLORIDE 0.9 % IV SOLN
INTRAVENOUS | Status: DC | PRN
  Administered 2017-01-16: 15:00:00

## 2017-01-16 MED ORDER — DEXAMETHASONE SODIUM PHOSPHATE 10 MG/ML IJ SOLN
INTRAMUSCULAR | Status: AC
  Filled 2017-01-16: qty 1

## 2017-01-16 MED ORDER — PROTAMINE SULFATE 10 MG/ML IV SOLN
INTRAVENOUS | Status: DC | PRN
  Administered 2017-01-16: 25 mg via INTRAVENOUS
  Administered 2017-01-16: 10 mg via INTRAVENOUS
  Administered 2017-01-16: 15 mg via INTRAVENOUS

## 2017-01-16 MED ORDER — CLOPIDOGREL BISULFATE 75 MG PO TABS
75.0000 mg | ORAL_TABLET | Freq: Every day | ORAL | Status: DC
  Administered 2017-01-17 – 2017-01-21 (×5): 75 mg via ORAL
  Filled 2017-01-16 (×5): qty 1

## 2017-01-16 MED ORDER — DEXTROSE 5 % IV SOLN
1.5000 g | Freq: Two times a day (BID) | INTRAVENOUS | Status: AC
  Administered 2017-01-17 (×2): 1.5 g via INTRAVENOUS
  Filled 2017-01-16 (×2): qty 1.5

## 2017-01-16 MED ORDER — FENTANYL CITRATE (PF) 100 MCG/2ML IJ SOLN
INTRAMUSCULAR | Status: AC
  Filled 2017-01-16: qty 2

## 2017-01-16 MED ORDER — SUGAMMADEX SODIUM 200 MG/2ML IV SOLN
INTRAVENOUS | Status: AC
  Filled 2017-01-16: qty 2

## 2017-01-16 MED ORDER — DOCUSATE SODIUM 100 MG PO CAPS
100.0000 mg | ORAL_CAPSULE | Freq: Every day | ORAL | Status: DC
  Administered 2017-01-17 – 2017-01-21 (×5): 100 mg via ORAL
  Filled 2017-01-16 (×5): qty 1

## 2017-01-16 MED ORDER — FENTANYL CITRATE (PF) 250 MCG/5ML IJ SOLN
INTRAMUSCULAR | Status: AC
  Filled 2017-01-16: qty 5

## 2017-01-16 MED ORDER — PHENYLEPHRINE HCL 10 MG/ML IJ SOLN
INTRAMUSCULAR | Status: DC | PRN
  Administered 2017-01-16: 25 ug/min via INTRAVENOUS

## 2017-01-16 MED ORDER — PROPOFOL 10 MG/ML IV BOLUS
INTRAVENOUS | Status: DC | PRN
  Administered 2017-01-16: 50 mg via INTRAVENOUS

## 2017-01-16 MED ORDER — SODIUM CHLORIDE 0.9 % IV SOLN: 500.0000 mL | Freq: Once | INTRAVENOUS | Status: DC | PRN

## 2017-01-16 MED ORDER — ENOXAPARIN SODIUM 40 MG/0.4ML ~~LOC~~ SOLN
40.0000 mg | SUBCUTANEOUS | Status: DC
  Administered 2017-01-17 – 2017-01-21 (×5): 40 mg via SUBCUTANEOUS
  Filled 2017-01-16 (×5): qty 0.4

## 2017-01-16 MED ORDER — SUGAMMADEX SODIUM 200 MG/2ML IV SOLN
INTRAVENOUS | Status: DC | PRN
  Administered 2017-01-16: 100 mg via INTRAVENOUS

## 2017-01-16 MED ORDER — MAGNESIUM SULFATE 2 GM/50ML IV SOLN
2.0000 g | Freq: Every day | INTRAVENOUS | Status: DC | PRN
  Filled 2017-01-16: qty 50

## 2017-01-16 MED ORDER — ONDANSETRON HCL 4 MG/2ML IJ SOLN
INTRAMUSCULAR | Status: AC
  Filled 2017-01-16: qty 6

## 2017-01-16 MED ORDER — FENTANYL CITRATE (PF) 100 MCG/2ML IJ SOLN
INTRAMUSCULAR | Status: DC | PRN
  Administered 2017-01-16 (×2): 50 ug via INTRAVENOUS
  Administered 2017-01-16: 150 ug via INTRAVENOUS

## 2017-01-16 MED ORDER — PROPOFOL 500 MG/50ML IV EMUL
INTRAVENOUS | Status: DC | PRN
  Administered 2017-01-16: 25 ug/kg/min via INTRAVENOUS

## 2017-01-16 MED ORDER — LABETALOL HCL 5 MG/ML IV SOLN
INTRAVENOUS | Status: DC | PRN
  Administered 2017-01-16 (×3): 5 mg via INTRAVENOUS

## 2017-01-16 MED ORDER — ONDANSETRON HCL 4 MG/2ML IJ SOLN
INTRAMUSCULAR | Status: DC | PRN
  Administered 2017-01-16: 4 mg via INTRAVENOUS

## 2017-01-16 SURGICAL SUPPLY — 67 items
ADH SKN CLS APL DERMABOND .7 (GAUZE/BANDAGES/DRESSINGS) ×4
BANDAGE ESMARK 6X9 LF (GAUZE/BANDAGES/DRESSINGS) IMPLANT
BNDG CMPR 9X6 STRL LF SNTH (GAUZE/BANDAGES/DRESSINGS) ×2
BNDG ESMARK 6X9 LF (GAUZE/BANDAGES/DRESSINGS) ×3
BNDG GAUZE ELAST 4 BULKY (GAUZE/BANDAGES/DRESSINGS) ×1 IMPLANT
CANISTER SUCT 3000ML PPV (MISCELLANEOUS) ×3 IMPLANT
CANNULA VESSEL 3MM 2 BLNT TIP (CANNULA) ×1 IMPLANT
CLIP TI MEDIUM 24 (CLIP) ×3 IMPLANT
CLIP TI WIDE RED SMALL 24 (CLIP) ×3 IMPLANT
CUFF TOURNIQUET SINGLE 24IN (TOURNIQUET CUFF) ×1 IMPLANT
CUFF TOURNIQUET SINGLE 34IN LL (TOURNIQUET CUFF) IMPLANT
CUFF TOURNIQUET SINGLE 44IN (TOURNIQUET CUFF) IMPLANT
DERMABOND ADVANCED (GAUZE/BANDAGES/DRESSINGS) ×2
DERMABOND ADVANCED .7 DNX12 (GAUZE/BANDAGES/DRESSINGS) ×2 IMPLANT
DRAIN CHANNEL 15F RND FF W/TCR (WOUND CARE) IMPLANT
DRAPE X-RAY CASS 24X20 (DRAPES) IMPLANT
DRSG ADAPTIC 3X8 NADH LF (GAUZE/BANDAGES/DRESSINGS) ×1 IMPLANT
ELECT CAUTERY BLADE 6.4 (BLADE) ×1 IMPLANT
ELECT REM PT RETURN 9FT ADLT (ELECTROSURGICAL) ×6
ELECTRODE REM PT RTRN 9FT ADLT (ELECTROSURGICAL) ×2 IMPLANT
EVACUATOR SILICONE 100CC (DRAIN) IMPLANT
GLOVE BIO SURGEON STRL SZ 6.5 (GLOVE) ×3 IMPLANT
GLOVE BIO SURGEON STRL SZ7.5 (GLOVE) ×1 IMPLANT
GLOVE BIOGEL PI IND STRL 6.5 (GLOVE) IMPLANT
GLOVE BIOGEL PI IND STRL 7.0 (GLOVE) IMPLANT
GLOVE BIOGEL PI IND STRL 7.5 (GLOVE) ×2 IMPLANT
GLOVE BIOGEL PI IND STRL 8 (GLOVE) IMPLANT
GLOVE BIOGEL PI INDICATOR 6.5 (GLOVE) ×1
GLOVE BIOGEL PI INDICATOR 7.0 (GLOVE) ×2
GLOVE BIOGEL PI INDICATOR 7.5 (GLOVE) ×1
GLOVE BIOGEL PI INDICATOR 8 (GLOVE) ×1
GLOVE INDICATOR 7.0 STRL GRN (GLOVE) ×1 IMPLANT
GLOVE SURG SS PI 6.5 STRL IVOR (GLOVE) ×1 IMPLANT
GLOVE SURG SS PI 7.5 STRL IVOR (GLOVE) ×3 IMPLANT
GOWN STRL REUS W/ TWL LRG LVL3 (GOWN DISPOSABLE) ×4 IMPLANT
GOWN STRL REUS W/ TWL XL LVL3 (GOWN DISPOSABLE) ×2 IMPLANT
GOWN STRL REUS W/TWL LRG LVL3 (GOWN DISPOSABLE) ×15
GOWN STRL REUS W/TWL XL LVL3 (GOWN DISPOSABLE) ×3
GRAFT PROPATEN W/RING 6X80X60 (Vascular Products) ×1 IMPLANT
HEMOSTAT SNOW SURGICEL 2X4 (HEMOSTASIS) ×2 IMPLANT
KIT BASIN OR (CUSTOM PROCEDURE TRAY) ×3 IMPLANT
KIT ROOM TURNOVER OR (KITS) ×3 IMPLANT
MARKER GRAFT CORONARY BYPASS (MISCELLANEOUS) IMPLANT
NS IRRIG 1000ML POUR BTL (IV SOLUTION) ×6 IMPLANT
PACK PERIPHERAL VASCULAR (CUSTOM PROCEDURE TRAY) ×3 IMPLANT
PAD ARMBOARD 7.5X6 YLW CONV (MISCELLANEOUS) ×6 IMPLANT
PENCIL BUTTON HOLSTER BLD 10FT (ELECTRODE) ×1 IMPLANT
SET COLLECT BLD 21X3/4 12 (NEEDLE) IMPLANT
SPONGE GAUZE 4X4 12PLY STER LF (GAUZE/BANDAGES/DRESSINGS) ×1 IMPLANT
STOPCOCK 4 WAY LG BORE MALE ST (IV SETS) IMPLANT
SUT ETHILON 3 0 PS 1 (SUTURE) IMPLANT
SUT PROLENE 5 0 C 1 24 (SUTURE) ×3 IMPLANT
SUT PROLENE 6 0 BV (SUTURE) ×11 IMPLANT
SUT PROLENE 7 0 BV 1 (SUTURE) IMPLANT
SUT SILK 2 0 SH (SUTURE) ×3 IMPLANT
SUT SILK 3 0 (SUTURE)
SUT SILK 3-0 18XBRD TIE 12 (SUTURE) IMPLANT
SUT VIC AB 2-0 CT1 27 (SUTURE) ×6
SUT VIC AB 2-0 CT1 TAPERPNT 27 (SUTURE) ×4 IMPLANT
SUT VIC AB 3-0 SH 27 (SUTURE) ×6
SUT VIC AB 3-0 SH 27X BRD (SUTURE) ×4 IMPLANT
SUT VICRYL 4-0 PS2 18IN ABS (SUTURE) ×6 IMPLANT
TAPE UMBILICAL COTTON 1/8X30 (MISCELLANEOUS) IMPLANT
TRAY FOLEY W/METER SILVER 16FR (SET/KITS/TRAYS/PACK) ×3 IMPLANT
TUBING EXTENTION W/L.L. (IV SETS) IMPLANT
UNDERPAD 30X30 (UNDERPADS AND DIAPERS) ×3 IMPLANT
WATER STERILE IRR 1000ML POUR (IV SOLUTION) ×3 IMPLANT

## 2017-01-16 NOTE — Transfer of Care (Signed)
Immediate Anesthesia Transfer of Care Note  Patient: [name]  Procedure(s) Performed: Procedure(s) with comments: REDO BYPASS GRAFT FEMORAL-TIBIAL ARTERY WITH GORTEX GRAFT (Left) - AND LOWER LEG  DRESSING CHANGE RIGHT BELOW KNEE AMPUTATION (Right)  Patient Location: PACU  Anesthesia Type:General  Level of Consciousness: awake and patient cooperative  Airway & Oxygen Therapy: Patient Spontanous Breathing and Patient connected to nasal cannula oxygen  Post-op Assessment: Report given to RN and Post -op Vital signs reviewed and stable  Post vital signs: Reviewed and stable  Last Vitals:  Vitals:   01/15/17 2022 01/16/17 0502  BP: (!) 167/64 (!) 186/70  Pulse: (!) 106 86  Resp:    Temp: 36.9 C 36.6 C    Last Pain:  Vitals:   01/16/17 0949  TempSrc:   PainSc: 8          Complications: No apparent anesthesia complications

## 2017-01-16 NOTE — Anesthesia Procedure Notes (Signed)
Procedure Name: Intubation Date/Time: 01/16/2017 2:14 PM Performed by: Trixie Deis A Pre-anesthesia Checklist: Patient identified, Emergency Drugs available, Suction available and Patient being monitored Patient Re-evaluated:Patient Re-evaluated prior to inductionOxygen Delivery Method: Circle System Utilized Preoxygenation: Pre-oxygenation with 100% oxygen Intubation Type: IV induction Ventilation: Mask ventilation without difficulty Laryngoscope Size: Mac and 3 Grade View: Grade I Tube type: Oral Tube size: 7.5 mm Number of attempts: 1 Airway Equipment and Method: Stylet and Oral airway Placement Confirmation: ETT inserted through vocal cords under direct vision,  positive ETCO2 and breath sounds checked- equal and bilateral Secured at: 20 cm Tube secured with: Tape Dental Injury: Teeth and Oropharynx as per pre-operative assessment

## 2017-01-16 NOTE — Care Management Important Message (Signed)
Important Message  Patient Details  Name: [name] MRN: [id number] Date of Birth: Aug 03, 1928   Medicare Important Message Given:  Yes    Nathen May 01/16/2017, 12:42 PM

## 2017-01-16 NOTE — Progress Notes (Signed)
[place] for heparin Indication: ischemic leg  Allergies  Allergen Reactions  . Motrin [Ibuprofen] Other (See Comments)    ADVERSE REACTION - GI BLEED  . Statins Other (See Comments)    ADVERSE REACTION MUSCLE PAIN & WEAKNESS  . Morphine And Related Other (See Comments)    HALLUCINATIONS REACTION IS SIDE EFFECT  . Oxycontin [Oxycodone Hcl] Other (See Comments)    [REACTION IS SIDE EFFECT]  > HALLUCINATIONS  . Promethazine Hcl Other (See Comments)    IV  Drug only, makes her act crazy  . Sulfa Antibiotics Nausea And Vomiting    Patient Measurements: Height: 5\' 2"  (157.5 cm) Weight: 111 lb 1.8 oz (50.4 kg) IBW/kg (Calculated) : 50.1 Heparin Dosing Weight: 51 kg  Vital Signs: Temp: 97.8 F (36.6 C) (03/02 0502) Temp Source: Oral (03/02 0502) BP: 186/70 (03/02 0502) Pulse Rate: 86 (03/02 0502)  Labs:  Recent Labs  01/14/17 0002 01/14/17 0237  01/14/17 1054 01/15/17 0340 01/16/17 0350  HGB 9.7* 9.5*  --   --  10.1* 10.1*  HCT 30.6* 29.9*  --   --  32.4* 31.6*  PLT 241 270  --   --  284 252  LABPROT 13.3  --   --   --   --  13.8  INR 1.01  --   --   --   --  1.06  HEPARINUNFRC  --   --   < > 0.43 0.38 0.38  CREATININE 1.02*  --   --   --   --  0.94  < > = values in this interval not displayed.  Estimated Creatinine Clearance: 34 mL/min (by C-G formula based on SCr of 0.94 mg/dL).   Medical History: Past Medical History:  Diagnosis Date  . Anemia   . Anginal pain ([place])   . Arthritis    "qwhere" (09/05/2016)  . Atrial fibrillation ([place]) 09/2016  . Chronic lower back pain   . Complication of anesthesia    "takes a long time for it to wear off; I can hallucinate if I take too much" (09/05/2016)  . DVT (deep venous thrombosis) ([place]) 10/2009  . Fall from steps 08/31/2013   Fx. pelvis, Left Hip, Left Elbow  . Fibromyalgia   . GERD (gastroesophageal reflux disease)    09/22/16- "no too much anymore"  . GI bleed 10/24/2016   . High cholesterol   . History of blood transfusion   . History of hiatal hernia   . Hypertension   . Macular degeneration, wet ([place])    "started in right eye; now legally blind in that eye; now started in left eye but pretty much in control" (09/05/2016)  . Osteoporosis   . Peripheral vascular disease ([place])    nonviable tissue Right foot  . PONV (postoperative nausea and vomiting)   . Squamous cell carcinoma of skin of right calf Aug. 2015  . Stroke ([place])    TIA's no residual  . TIA (transient ischemic attack)    "several at once; none in a long time" (09/05/2016)    Assessment: 5 yoF with significant PVD now s/p R BKA presents s/p fall with subsequent numbness in L foot. Per vascular, pt has extensive history of interventions on L leg (most recent 09/2016). Pharmacy consulted to dose heparin. Plans noted for possible  left leg bypass on Friday.  -heparin level= 0.38 again, CBC stable.  Goal of Therapy:  Heparin level 0.3-0.7 units/ml Monitor platelets by anticoagulation protocol: Yes  Plan:  -Continue IV heparin at 700 units/hr -Monitor heparin level, CBC -F/U surgery plans/vascular recs   Yahsir Wickens S. Alford Highland, PharmD, [facility] Clinical Staff Pharmacist Pager [phone]   01/16/2017 11:18 AM

## 2017-01-16 NOTE — Interval H&P Note (Signed)
History and Physical Interval Note:  01/16/2017 12:38 PM  [name]  has presented today for surgery, with the diagnosis of Ischemic left lower extremity M62.89  The various methods of treatment have been discussed with the patient and family. After consideration of risks, benefits and other options for treatment, the patient has consented to  Procedure(s): REDO BYPASS GRAFT FEMORAL-TIBIAL ARTERY (Left) as a surgical intervention .  The patient's history has been reviewed, patient examined, no change in status, stable for surgery.  I have reviewed the patient's chart and labs.  Questions were answered to the patient's satisfaction.     [name]

## 2017-01-16 NOTE — H&P (View-Only) (Signed)
[facility] [place]  Subjective  - Rested some last night.   Objective (!) 178/55 78 98.1 F (36.7 C) (Oral) 18 96% No intake or output data in the 24 hours ending 01/15/17 0714  Minimal active range of motion left foot and toes. Left foot cool touch   Assessment/Planning: POD # Occluded Left LE by pass Comfortable over all with PO pain medication Continue heparin drip Pre-op for left re do fem-distal target by Fae Pippin, EMMA [facility] 01/15/2017 7:14 AM --  Laboratory Lab Results:  Recent Labs  01/14/17 0237 01/15/17 0340  WBC 8.3 7.6  HGB 9.5* 10.1*  HCT 29.9* 32.4*  PLT 270 284   BMET  Recent Labs  01/13/17 0540 01/14/17 0002  NA  --  135  K  --  4.0  CL  --  104  CO2  --  22  GLUCOSE  --  97  BUN  --  16  CREATININE 1.00 1.02*  CALCIUM  --  9.3    COAG Lab Results  Component Value Date   INR 1.01 01/14/2017   INR 1.14 12/23/2016   INR 1.00 10/23/2016   No results found for: PTT   Agree with the above.  I have seen and examined the patient.  Her left foot remains viable.  I have reviewed her CTA and will plan for a femoral to AT bypass with PTFE tomorrow.  I discussed this with the patient and daughter and they wish to proceed.  [name]

## 2017-01-16 NOTE — Anesthesia Preprocedure Evaluation (Addendum)
Anesthesia Evaluation    Airway Mallampati: II  TM Distance: >3 FB Neck ROM: Full    Dental  (+) Upper Dentures, Lower Dentures   Pulmonary former smoker,    Pulmonary exam normal        Cardiovascular hypertension, + DVT  Normal cardiovascular exam+ dysrhythmias Atrial Fibrillation   12/19/16 ECHO: EF 55% to 60%, Wall motion was normal; there were no regional wall motion abnormalities. - Aortic valve:  mild to moderate regurgitation.   Neuro/Psych TIA   GI/Hepatic GERD  ,  Endo/Other    Renal/GU      Musculoskeletal  (+) Arthritis , Osteoarthritis,  Fibromyalgia -  Abdominal Normal abdominal exam  (+)   Peds  Hematology   Anesthesia Other Findings   Reproductive/Obstetrics                           Anesthesia Physical Anesthesia Plan  ASA: III  Anesthesia Plan: General   Post-op Pain Management:    Induction: Intravenous  Airway Management Planned: Oral ETT  Additional Equipment:   Intra-op Plan:   Post-operative Plan: Extubation in OR  Informed Consent: I have reviewed the patients History and Physical, chart, labs and discussed the procedure including the risks, benefits and alternatives for the proposed anesthesia with the patient or authorized representative who has indicated his/her understanding and acceptance.     Plan Discussed with: Anesthesiologist and CRNA  Anesthesia Plan Comments:         Anesthesia Quick Evaluation

## 2017-01-16 NOTE — Op Note (Signed)
    Patient name: [name] MRN: [id number] DOB: 04/26/1928 Sex: female  01/13/2017 - 01/16/2017 Pre-operative Diagnosis: Occluded left leg bypass with rest pain Post-operative diagnosis:  Same Surgeon:  [name] Assistants:  Gae Gallop Procedure:   #1:  Left profunda to anterior tibial bypass with PTFE   #2:  Redo femoral artery exposure Anesthesia:  General Blood Loss:  See anesthesia record Specimens:  none  Findings:  Healthy ATA.  Proximal anastamosis was to the proximal profunda.  Extensive scar tissue in the left groin.  Brisk ATA doppler signal after bypass  Indications:  83 yo female with occlusion of her left leg bypass with severe rest pain  Procedure:  The patient was identified in the holding area and taken to [facility]  The patient was then placed supine on the table. general anesthesia was administered.  The patient was prepped and draped in the usual sterile fashion.  A time out was called and antibiotics were administered.  Dr. Scot Dock exposed the anterior tibial artery through a lateral longitudinal incision.  It was a health appearing artery.  I initially made a longitudinal incision in the upper leg and dissected out the profunda from the lateral side of the sartorius.  The artery at this level was small and so I traced it proximally and had to further mobilize it on the medial side of the sartorius muscle.  I ended up dissecting out the distal common femoral artery to get proximal control.  At this level, there was extensive dense scar tissue.  Ultimately I was able to isolate all profunda branches.  Next a curved 66mm Gore tunneller was used to create a subcutaneous tunnel between the 2 incisions.  A 81mm external ring PTFE propatent graft was brought through the tunnel.  The patient was heparanized.  A tournaquet was placed on the lower thigh, and the leg was exanguinated with an esmarch.  Dr. Scot Dock did the distal anastamosis with a 6-0 prolene ina running  end to side fashion.  Simultanously, I did the proximal anastamosis to the proximal profunda artery with a 6-0 prolene.  Appropriate flushing maneuvers were perfomed, the tournaquet was let down, and both anastamoses were completed.  Several repair sutures were required for hemostasis.  There was a brisk ATA doppler signal in the distal leg.  50 mg of protamine was given.  Once hemostasis was achieved, the incisions were closed with multiple layers of vicryl followed by dermabond..   Disposition:  PACU stable   V. [name], M.D. [facility] [place] Office: [phone] Pager:  [phone]

## 2017-01-17 LAB — CBC
HCT: 26 % — ABNORMAL LOW (ref 36.0–46.0)
HEMATOCRIT: 27.2 % — AB (ref 36.0–46.0)
HEMOGLOBIN: 8.3 g/dL — AB (ref 12.0–15.0)
HEMOGLOBIN: 8.8 g/dL — AB (ref 12.0–15.0)
MCH: 28.9 pg (ref 26.0–34.0)
MCH: 29.1 pg (ref 26.0–34.0)
MCHC: 31.9 g/dL (ref 30.0–36.0)
MCHC: 32.4 g/dL (ref 30.0–36.0)
MCV: 90.1 fL (ref 78.0–100.0)
MCV: 90.6 fL (ref 78.0–100.0)
PLATELETS: 215 10*3/uL (ref 150–400)
Platelets: 214 10*3/uL (ref 150–400)
RBC: 2.87 MIL/uL — AB (ref 3.87–5.11)
RBC: 3.02 MIL/uL — AB (ref 3.87–5.11)
RDW: 16.1 % — ABNORMAL HIGH (ref 11.5–15.5)
RDW: 16.3 % — ABNORMAL HIGH (ref 11.5–15.5)
WBC: 10.8 10*3/uL — ABNORMAL HIGH (ref 4.0–10.5)
WBC: 10.5 10*3/uL (ref 4.0–10.5)

## 2017-01-17 LAB — BASIC METABOLIC PANEL
ANION GAP: 9 (ref 5–15)
BUN: 14 mg/dL (ref 6–20)
CHLORIDE: 100 mmol/L — AB (ref 101–111)
CO2: 23 mmol/L (ref 22–32)
Calcium: 9.1 mg/dL (ref 8.9–10.3)
Creatinine, Ser: 0.87 mg/dL (ref 0.44–1.00)
GFR calc non Af Amer: 59 mL/min — ABNORMAL LOW (ref >60)
Glucose, Bld: 155 mg/dL — ABNORMAL HIGH (ref 65–99)
POTASSIUM: 4.3 mmol/L (ref 3.5–5.1)
Sodium: 132 mmol/L — ABNORMAL LOW (ref 135–145)

## 2017-01-17 LAB — URINALYSIS, ROUTINE W REFLEX MICROSCOPIC
BACTERIA UA: NONE SEEN
BILIRUBIN URINE: NEGATIVE
GLUCOSE, UA: NEGATIVE mg/dL
Ketones, ur: NEGATIVE mg/dL
NITRITE: NEGATIVE
PROTEIN: NEGATIVE mg/dL
Specific Gravity, Urine: 1.016 (ref 1.005–1.030)
Squamous Epithelial / LPF: NONE SEEN
pH: 5 (ref 5.0–8.0)

## 2017-01-17 NOTE — Evaluation (Signed)
Physical Therapy Evaluation Patient Details Name: [name] MRN: [id number] DOB: [birth year] Today's Date: 01/17/2017   History of Present Illness  Pt is an 83 y.o. 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 PMHx: Anginal pain, Arthritis, Chronic lower back pain, DVT, Fibromyalgia, GERD, High cholesterol, HTN, Macular degeneration, PVD, TIA.  Clinical Impression  Pt with flat affect who had just awoken prior to my arrival. Pt with decreased strength, mobility, balance and function with decline in physical ability since D/C home and readmission with daughter stating pt has not moved since admission. Pt unable to recall BKA initially EOB and required cues and assist for all mobility today. Daughter wants pt to be able to return to CIR and then home which is a feasible plan at this time. Pt will benefit from acute therapy to maximize mobility, function, balance, transfers and strength to decrease burden of care.      Follow Up Recommendations CIR;Supervision/Assistance - 24 hour    Equipment Recommendations  None recommended by PT    Recommendations for Other Services Rehab consult     Precautions / Restrictions Precautions Precautions: Fall Precaution Comments: pt not to push or pull with Lt elbow more than 5 lbs (s/p total elbow replacement)  Restrictions Weight Bearing Restrictions: Yes RLE Weight Bearing: Non weight bearing      Mobility  Bed Mobility Overal bed mobility: Needs Assistance Bed Mobility: Supine to Sit;Sit to Supine     Supine to sit: Mod assist Sit to supine: Max assist   General bed mobility comments: increased time with assist to elevate trunk, move legs and use of bed to fully scoot to EOB, Max assist with assist for full body translation from sitting to supine. 2 person assist to scoot to [facility]  Transfers Overall transfer level: Needs assistance   Transfers: Sit to/from  Stand Sit to Stand: Total assist         General transfer comment: max multimodal cues with pt with tendency for posterior lean, limited ability to block and utilize knee for assist due to sensitive skin. Use of bed pad and bed elevation for rise and anterior translation. Able to place weight on LLE momentarily before sitting limited by pain. Max assist to scoot back onto surface fully. Attempted to stand 3x total with one successful attempt  Ambulation/Gait             General Gait Details: unable  Stairs            Wheelchair Mobility    Modified Rankin (Stroke Patients Only)       Balance Overall balance assessment: Needs assistance   Sitting balance-Leahy Scale: Poor                                       Pertinent Vitals/Pain Pain Score: 6  Pain Location: LLE at incision and distal Pain Descriptors / Indicators: Sore Pain Intervention(s): Limited activity within patient's tolerance;Repositioned;Monitored during session    Home Living Family/patient expects to be discharged to:: Private residence Living Arrangements: Spouse/significant other Available Help at Discharge: Family;Available 24 hours/day Type of Home: House Home Access: Ramped entrance     Home Layout: One level Home Equipment: Walker - 2 wheels;Bedside commode;Cane - single point;Grab bars - tub/shower;Wheelchair - Education administrator (comment);Shower seat (sliding board) Additional Comments: pt family is  planning for 24/7 supervision as needed. Pt sleeps in a recliner since elbow replacement years ago    Prior Function Level of Independence: Needs assistance   Gait / Transfers Assistance Needed: Pt has been standing to pivot with assist since BKA, propelling WC 100', does not prefer sliding board           Hand Dominance        Extremity/Trunk Assessment   Upper Extremity Assessment Upper Extremity Assessment: Generalized weakness;LUE deficits/detail LUE Deficits /  Details: limited ability to utilize due to elbow replacement    Lower Extremity Assessment Lower Extremity Assessment: Generalized weakness LLE Deficits / Details: sensitive to touch with limited strength grossly 2/5 today    Cervical / Trunk Assessment Cervical / Trunk Assessment: Kyphotic  Communication   Communication: No difficulties  Cognition Arousal/Alertness: Awake/alert Behavior During Therapy: Flat affect Overall Cognitive Status: Impaired/Different from baseline Area of Impairment: Memory     Memory: Decreased short-term memory         General Comments: pt does not recall BKA    General Comments      Exercises     Assessment/Plan    PT Assessment Patient needs continued PT services  PT Problem List Decreased strength;Decreased mobility;Decreased safety awareness;Decreased activity tolerance;Decreased cognition;Decreased balance;Decreased knowledge of use of DME;Pain;Decreased skin integrity       PT Treatment Interventions DME instruction;Functional mobility training;Therapeutic activities;Therapeutic exercise;Balance training;Neuromuscular re-education;Patient/family education;Wheelchair mobility training    PT Goals (Current goals can be found in the Care Plan section)  Acute Rehab PT Goals Patient Stated Goal: return home PT Goal Formulation: With patient/family Time For Goal Achievement: 01/31/17 Potential to Achieve Goals: Fair    Frequency Min 3X/week   Barriers to discharge Decreased caregiver support      Co-evaluation               End of Session Equipment Utilized During Treatment: Gait belt Activity Tolerance: Patient limited by fatigue Patient left: in bed;with call bell/phone within reach;with family/visitor present Nurse Communication: Mobility status;Precautions PT Visit Diagnosis: Muscle weakness (generalized) (M62.81);Other abnormalities of gait and mobility (R26.89)         Time: [id number] PT Time Calculation (min)  (ACUTE ONLY): 31 min   Charges:   PT Evaluation $PT Eval Moderate Complexity: 1 Procedure PT Treatments $Therapeutic Activity: 8-22 mins   PT G Codes:         Ranee Peasley B Caralina Nop 2017-01-21, 10:02 AM  Elwyn Reach, [place]

## 2017-01-17 NOTE — Anesthesia Postprocedure Evaluation (Addendum)
Anesthesia Post Note  Patient: CATIA HIERONYMUS  Procedure(s) Performed: Procedure(s) (LRB): REDO BYPASS GRAFT FEMORAL-TIBIAL ARTERY WITH GORTEX GRAFT (Left) DRESSING CHANGE RIGHT BELOW KNEE AMPUTATION (Right)  Patient location during evaluation: PACU Anesthesia Type: General Level of consciousness: awake and alert Pain management: pain level controlled Vital Signs Assessment: post-procedure vital signs reviewed and stable Respiratory status: spontaneous breathing, nonlabored ventilation, respiratory function stable and patient connected to nasal cannula oxygen Cardiovascular status: blood pressure returned to baseline and stable Postop Assessment: no signs of nausea or vomiting Anesthetic complications: no       Last Vitals:  Vitals:   01/17/17 0802 01/17/17 0951  BP: (!) 128/41   Pulse: 78 97  Resp:    Temp:      Last Pain:  Vitals:   01/17/17 1111  TempSrc:   PainSc: 8                  Leandre Wien

## 2017-01-17 NOTE — Progress Notes (Signed)
Rehab Admissions Coordinator Note:  Patient was screened by Cleatrice Burke for appropriateness for an Inpatient Acute Rehab Consult per PT recommendation and family request.  At this time, we are recommending Inpatient Rehab consult. Please place order if you would like pt considered for a CIR readmission.  Cleatrice Burke 01/17/2017, 10:52 AM  I can be reached at [phone].

## 2017-01-17 NOTE — Progress Notes (Addendum)
[facility] [place]  Subjective  - Resting    Objective (!) 147/43 70 98 F (36.7 C) (Oral) 20 100%  Intake/Output Summary (Last 24 hours) at 01/17/17 0751 Last data filed at 01/17/17 0445  Gross per 24 hour  Intake             1300 ml  Output              375 ml  Net              925 ml    Left peroneal doppler signal Incisions healing well , groin soft without hematoma Skin is normal color and minimal active range of motion intact Heart RRR Lungs non labored breathing   Assessment/Planning: POD # 1 re-do left LE by pass #1:  Left profunda to anterior tibial bypass with PTFE                         #2:  Redo femoral artery exposure  Cr stable 0.87 HGB stable 8.3 UO 375 last 24 hours Will order UA today patient has foley in place plan to D/C in am tomorrow   Laurence Slate [facility] 01/17/2017 7:51 AM -- Left leg and foot very sore Biphasic doppler in bypass distal AT and DP Check UA then foley out Try to mobilize  Ruta Hinds, MD [facility] [place] Office: [phone] Pager: [phone]  Laboratory Lab Results:  Recent Labs  01/16/17 2358 01/17/17 0520  WBC 10.5 10.8*  HGB 8.8* 8.3*  HCT 27.2* 26.0*  PLT 214 215   BMET  Recent Labs  01/16/17 0350 01/16/17 1914 01/16/17 2358  NA 131*  --  132*  K 4.1  --  4.3  CL 95*  --  100*  CO2 25  --  23  GLUCOSE 111*  --  155*  BUN 17  --  14  CREATININE 0.94 0.83 0.87  CALCIUM 10.0  --  9.1    COAG Lab Results  Component Value Date   INR 1.06 01/16/2017   INR 1.01 01/14/2017   INR 1.14 12/23/2016   No results found for: PTT

## 2017-01-18 LAB — CBC
HCT: 22.5 % — ABNORMAL LOW (ref 36.0–46.0)
HEMOGLOBIN: 7.2 g/dL — AB (ref 12.0–15.0)
MCH: 29.9 pg (ref 26.0–34.0)
MCHC: 32 g/dL (ref 30.0–36.0)
MCV: 93.4 fL (ref 78.0–100.0)
PLATELETS: 183 10*3/uL (ref 150–400)
RBC: 2.41 MIL/uL — AB (ref 3.87–5.11)
RDW: 17 % — ABNORMAL HIGH (ref 11.5–15.5)
WBC: 7.5 10*3/uL (ref 4.0–10.5)

## 2017-01-18 NOTE — Progress Notes (Addendum)
[facility] [place]  Subjective  - frail, lots of complaints of pain in right BKA and left foot and leg incisions   Objective (!) 122/46 73 97.9 F (36.6 C) (Oral) 18 98%  Intake/Output Summary (Last 24 hours) at 01/18/17 1514 Last data filed at 01/17/17 1900  Gross per 24 hour  Intake                0 ml  Output              100 ml  Net             -100 ml   Left foot warm, incisions clean no hematoma   UA lots of wbc and blood but nitrite neg no bacteria   Assessment/Planning: Slowly recovering from recent AT bypass Try to mobilize Would not Rx for UTI as clinically asymptomatic  Ruta Hinds 01/18/2017 3:14 PM --  Laboratory Lab Results:  Recent Labs  01/17/17 0520 01/18/17 0253  WBC 10.8* 7.5  HGB 8.3* 7.2*  HCT 26.0* 22.5*  PLT 215 183   BMET  Recent Labs  01/16/17 0350 01/16/17 1914 01/16/17 2358  NA 131*  --  132*  K 4.1  --  4.3  CL 95*  --  100*  CO2 25  --  23  GLUCOSE 111*  --  155*  BUN 17  --  14  CREATININE 0.94 0.83 0.87  CALCIUM 10.0  --  9.1    COAG Lab Results  Component Value Date   INR 1.06 01/16/2017   INR 1.01 01/14/2017   INR 1.14 12/23/2016   No results found for: PTT

## 2017-01-18 NOTE — Evaluation (Addendum)
Occupational Therapy Evaluation Patient Details Name: [name] MRN: [id number] DOB: 23-Apr-1928 Today's Date: 01/18/2017    History of Present Illness Pt is an 83 y.o. 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 PMHx: Anginal pain, Arthritis, Chronic lower back pain, DVT, Fibromyalgia, GERD, High cholesterol, HTN, Macular degeneration, PVD, TIA. Pt now s/p REDO BYPASS GRAFT FEMORAL-TIBIAL ARTERY WITH GORTEX GRAFT (Left).    Clinical Impression   Pt getting assist with ADLs, PTA. Feel pt will benefit from acute OT to increase independence prior to d/c. Recommending CIR, but pt not agreeable at this time (however, family is). If pt continues to refuse, recommend SNF.     Follow Up Recommendations  CIR    Equipment Recommendations  Other (comment) (defer to next venue)    Recommendations for Other Services Rehab consult     Precautions / Restrictions Precautions Precautions: Fall Precaution Comments: pt not to push or pull with Lt elbow more than 5 lbs (s/p total elbow replacement)  Restrictions Weight Bearing Restrictions: Yes RLE Weight Bearing: Non weight bearing      Mobility Bed Mobility Overal bed mobility: Needs Assistance Bed Mobility: Rolling Rolling: Mod assist                              ADL Overall ADL's : Needs assistance/impaired     Grooming: Wash/dry face;Brushing hair;Set up;Bed level;Supervision/safety               Lower Body Dressing: Bed level;Maximal assistance     Toilet Transfer Details (indicate cue type and reason): rolled in bed so bedpan could be positioned under her-Mod A for rolling ' Toileting- Clothing Manipulation and Hygiene: Bed level;Maximal assistance Toileting - Clothing Manipulation Details (indicate cue type and reason): pt able to wash peri area with washcloth. Cues for precautions for left UE.             Vision          Perception     Praxis      Pertinent Vitals/Pain Pain Assessment: Faces Faces Pain Scale: Hurts even more Pain Location: LLE Pain Descriptors / Indicators: Burning Pain Intervention(s): Monitored during session     Hand Dominance     Extremity/Trunk Assessment Upper Extremity Assessment Upper Extremity Assessment: Overall WFL for tasks assessed;LUE deficits/detail LUE Deficits / Details: previous elbow replacement   Lower Extremity Assessment Lower Extremity Assessment: Defer to PT evaluation       Communication Communication Communication:  (question if she was slightly HOH)   Cognition Arousal/Alertness: Awake/alert Behavior During Therapy: Flat affect Overall Cognitive Status:  (unsure of baseline) Area of Impairment: Awareness           Awareness:  (pt thinking she was going to fall and she was lying in bed)       General Comments       Exercises       Shoulder Instructions      Home Living Family/patient expects to be discharged to:: Private residence Living Arrangements: Spouse/significant other Available Help at Discharge: Family;Available 24 hours/day Type of Home: House Home Access: Ramped entrance     Home Layout: One level     Bathroom Shower/Tub: Walk-in shower;Other (comment);Door   [employer] Toilet: Standard     Home Equipment: Environmental consultant - 2 wheels;Bedside commode;Cane - single point;Grab bars - tub/shower;Wheelchair - Education administrator (comment);Shower  seat (BSC x2)      Lives With: Spouse    Prior Functioning/Environment Level of Independence: Needs assistance  Gait / Transfers Assistance Needed: Pt has been standing to pivot with assist since BKA, propelling WC 100', does not prefer sliding board ADL's / Homemaking Assistance Needed: assist with LB ADLs and shower transfer            OT Problem List: Decreased strength;Pain;Decreased cognition;Decreased knowledge of use of DME or AE;Decreased knowledge of precautions;Decreased  activity tolerance;Decreased range of motion      OT Treatment/Interventions: Self-care/ADL training;DME and/or AE instruction;Therapeutic exercise;Therapeutic activities;Cognitive remediation/compensation;Patient/family education;Balance training    OT Goals(Current goals can be found in the care plan section) Acute Rehab OT Goals Patient Stated Goal: go home OT Goal Formulation: With patient Time For Goal Achievement: 01/25/17 Potential to Achieve Goals: Good ADL Goals Pt Will Perform Lower Body Bathing: with mod assist;sitting/lateral leans;sit to/from stand (with or without AE) Pt Will Perform Lower Body Dressing: with mod assist;sitting/lateral leans;sit to/from stand (with or without AE) Pt Will Transfer to Toilet: with mod assist;with +2 assist;stand pivot transfer;bedside commode;squat pivot transfer Additional ADL Goal #1: Pt will perform bed mobility with Min A.   OT Frequency: Min 2X/week   Barriers to D/C:            Co-evaluation              End of Session    Activity Tolerance: Patient limited by pain Patient left: in bed;with family/visitor present;Other (comment) (nurse tech in room)  OT Visit Diagnosis: Pain Pain - Right/Left: Left Pain - part of body: Leg                ADL either performed or assessed with clinical judgement  Time: [id number] OT Time Calculation (min): 22 min Charges:  OT General Charges $OT Visit: 1 Procedure OT Evaluation $OT Eval Moderate Complexity: 1 Procedure G-Codes:       Karmello Abercrombie L OTR/L 01/18/2017, 11:03 AM

## 2017-01-18 NOTE — Progress Notes (Signed)
This RN has assumed care of pt. Will continue current plan of care.   Grant Fontana BSN, RN

## 2017-01-19 ENCOUNTER — Encounter: Payer: Medicare Other | Admitting: Surgery

## 2017-01-19 ENCOUNTER — Encounter (HOSPITAL_COMMUNITY): Payer: Self-pay | Admitting: Surgery

## 2017-01-19 LAB — CBC
HCT: 23.9 % — ABNORMAL LOW (ref 36.0–46.0)
Hemoglobin: 7.5 g/dL — ABNORMAL LOW (ref 12.0–15.0)
MCH: 29.2 pg (ref 26.0–34.0)
MCHC: 31.4 g/dL (ref 30.0–36.0)
MCV: 93 fL (ref 78.0–100.0)
PLATELETS: 203 10*3/uL (ref 150–400)
RBC: 2.57 MIL/uL — AB (ref 3.87–5.11)
RDW: 16.4 % — ABNORMAL HIGH (ref 11.5–15.5)
WBC: 6.7 10*3/uL (ref 4.0–10.5)

## 2017-01-19 NOTE — Progress Notes (Addendum)
[facility] [place]  Subjective  - Pain better controlled.   Objective (!) 166/56 85 98.1 F (36.7 C) (Oral) 18 98% No intake or output data in the 24 hours ending 01/19/17 0733  Peroneal and AT signal left LE Incisions clean and dry without hematoma Heart RRR Lungs non labored breathing  Assessment/Planning: POD # 3 re-do left LE by pass #1: Left profunda to anterior tibial bypass with PTFE #2: Redo femoral artery exposure  No void since 5 pm yesterday Will bladder scan and in/out cath if needed Mobility PT ordered.  Laurence Slate [facility] 01/19/2017 7:33 AM --  Laboratory Lab Results:  Recent Labs  01/18/17 0253 01/19/17 0223  WBC 7.5 6.7  HGB 7.2* 7.5*  HCT 22.5* 23.9*  PLT 183 203   BMET  Recent Labs  01/16/17 1914 01/16/17 2358  NA  --  132*  K  --  4.3  CL  --  100*  CO2  --  23  GLUCOSE  --  155*  BUN  --  14  CREATININE 0.83 0.87  CALCIUM  --  9.1    COAG Lab Results  Component Value Date   INR 1.06 01/16/2017   INR 1.01 01/14/2017   INR 1.14 12/23/2016   No results found for: PTT   I agree with the above.  She is complaining of incisional pain.  She has a PALPABLE DP pulse in her left foot.  I helped her move to a chair tonight.  We ordered a rehab consult as I think she would benefit from another week or so in rehab.  Her fluids were discontinued.  [name]

## 2017-01-19 NOTE — Progress Notes (Signed)
Pt continues to have a lot of pain post surgery. PT recommending CIR. CM following for d/c disposition.

## 2017-01-20 DIAGNOSIS — I10 Essential (primary) hypertension: Secondary | ICD-10-CM

## 2017-01-20 DIAGNOSIS — M797 Fibromyalgia: Secondary | ICD-10-CM

## 2017-01-20 DIAGNOSIS — I480 Paroxysmal atrial fibrillation: Secondary | ICD-10-CM

## 2017-01-20 DIAGNOSIS — N183 Chronic kidney disease, stage 3 unspecified: Secondary | ICD-10-CM

## 2017-01-20 DIAGNOSIS — D62 Acute posthemorrhagic anemia: Secondary | ICD-10-CM

## 2017-01-20 DIAGNOSIS — I739 Peripheral vascular disease, unspecified: Secondary | ICD-10-CM

## 2017-01-20 DIAGNOSIS — I998 Other disorder of circulatory system: Secondary | ICD-10-CM

## 2017-01-20 DIAGNOSIS — R8290 Unspecified abnormal findings in urine: Secondary | ICD-10-CM

## 2017-01-20 DIAGNOSIS — Z89511 Acquired absence of right leg below knee: Secondary | ICD-10-CM

## 2017-01-20 DIAGNOSIS — D638 Anemia in other chronic diseases classified elsewhere: Secondary | ICD-10-CM

## 2017-01-20 DIAGNOSIS — G894 Chronic pain syndrome: Secondary | ICD-10-CM

## 2017-01-20 LAB — CBC
HCT: 22.7 % — ABNORMAL LOW (ref 36.0–46.0)
Hemoglobin: 7.2 g/dL — ABNORMAL LOW (ref 12.0–15.0)
MCH: 29.1 pg (ref 26.0–34.0)
MCHC: 31.7 g/dL (ref 30.0–36.0)
MCV: 91.9 fL (ref 78.0–100.0)
PLATELETS: 211 10*3/uL (ref 150–400)
RBC: 2.47 MIL/uL — ABNORMAL LOW (ref 3.87–5.11)
RDW: 16.1 % — AB (ref 11.5–15.5)
WBC: 5.4 10*3/uL (ref 4.0–10.5)

## 2017-01-20 MED ORDER — FENTANYL 12 MCG/HR TD PT72
12.5000 ug | MEDICATED_PATCH | TRANSDERMAL | Status: DC
  Administered 2017-01-20: 12.5 ug via TRANSDERMAL
  Filled 2017-01-20: qty 1

## 2017-01-20 MED ORDER — BISACODYL 10 MG RE SUPP
10.0000 mg | Freq: Every day | RECTAL | Status: DC | PRN
  Administered 2017-01-21: 10 mg via RECTAL
  Filled 2017-01-20: qty 1

## 2017-01-20 NOTE — Progress Notes (Addendum)
[facility] [place]  Subjective  - Doing a little better.  Last dose of IV dilaudid given last night.   Objective (!) 141/60 87 97.7 F (36.5 C) (Oral) 18 100%  Intake/Output Summary (Last 24 hours) at 01/20/17 [id number] Last data filed at 01/19/17 1655  Gross per 24 hour  Intake              490 ml  Output              400 ml  Net               90 ml   Palpable DP, able to do SLR with B LE Right stump viable healing well, tolerates the ace wrap better than the stump shrinker Incisions healing well, sat up in chair yesterday UO 850 cc yesterday No BM, positive BS abdomin soft   Assessment/Planning: POD # 4 re-do left LE by pass #1: Left profunda to anterior tibial bypass with PTFE #2: Redo femoral artery exposure Voided No BM but asymptomatic getting daily stool softener Sat in chair yesterday Pending home verses re admission to CIR? By pass patent palpable DP pulse left Will restart fentanyl patch 12.5 mcg today this may help with her pain issues On plavix    Laurence Slate [facility] 01/20/2017 8:07 AM --  Laboratory Lab Results:  Recent Labs  01/19/17 0223 01/20/17 0210  WBC 6.7 5.4  HGB 7.5* 7.2*  HCT 23.9* 22.7*  PLT 203 211   BMET No results for input(s): NA, K, CL, CO2, GLUCOSE, BUN, CREATININE, CALCIUM in the last 72 hours.  COAG Lab Results  Component Value Date   INR 1.06 01/16/2017   INR 1.01 01/14/2017   INR 1.14 12/23/2016   No results found for: PTT  Ate 1/4 of [place] sandwich.  Has palpable pedal pulse.  Pain a little better.  Hopefully to CIR in 24-48 hours.  [name]

## 2017-01-20 NOTE — Consult Note (Signed)
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 Deitra Mayo   HPI: [name] is a 83 y.o. 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 She was able to complete sliding board transfers with minimal assist and transferred mat to wheelchair using sliding board squat pivot technique. She lives with her spouse and family with assistance as needed. Presented 01/15/2017 with left leg numbness and increasing pain. Duplex study ultrasound showed occlusion of left leg bypass site. Underwent left profunda to anterior tibial bypass with redo femoral artery exposure 01/16/2017 per [name]. [facility] course pain management. Acute on chronic anemia 7.2-8.3 and monitored. Subcutaneous Lovenox for DVT prophylaxis. Physical and occupational therapy evaluations completed with recommendations of physical medicine rehabilitation consult.   Review of Systems  Constitutional: Negative for chills and fever.  Eyes: Negative for blurred vision and double vision.  Respiratory: Positive for shortness of breath. Negative for cough.   Cardiovascular: Negative for chest pain.  Gastrointestinal: Positive for constipation. Negative for nausea and vomiting.       GERD  Musculoskeletal: Positive for back pain, joint pain and myalgias.  Skin: Negative for rash.  Neurological: Positive for weakness.  Psychiatric/Behavioral:       Anxiety  All other systems reviewed and are  negative.  Past Medical History:  Diagnosis Date  . Anemia   . Anginal pain ([place])   . Arthritis    "qwhere" (09/05/2016)  . Atrial fibrillation ([place]) 09/2016  . Chronic lower back pain   . Complication of anesthesia    "takes a long time for it to wear off; I can hallucinate if I take too much" (09/05/2016)  . DVT (deep venous thrombosis) ([place]) 10/2009  . Fall from steps 08/31/2013   Fx. pelvis, Left Hip, Left Elbow  . Fibromyalgia   . GERD (gastroesophageal reflux disease)    09/22/16- "no too much anymore"  . GI bleed 10/24/2016  . High cholesterol   . History of blood transfusion   . History of hiatal hernia   . Hypertension   . Macular degeneration, wet ([place])    "started in right eye; now legally blind in that eye; now started in left eye but pretty much in control" (09/05/2016)  . Osteoporosis   . Peripheral vascular disease ([place])    nonviable tissue Right foot  . PONV (postoperative nausea and vomiting)   . Squamous cell carcinoma of skin of right calf Aug. 2015  . Stroke ([place])    TIA's no residual  . TIA (transient ischemic attack)    "several at once; none in a long time" (09/05/2016)   Past Surgical History:  Procedure Laterality Date  . ABDOMINAL AORTAGRAM N/A 12/26/2014   Procedure: ABDOMINAL Maxcine Ham;  Surgeon: Serafina Mitchell, MD;  Location: [facility] CATH LAB;  Service: Cardiovascular;  Laterality: N/A;  . AMPUTATION Right 12/23/2016   Procedure: AMPUTATION BELOW KNEE;  Surgeon: Serafina Mitchell, MD;  Location: [place];  Service: Vascular;  Laterality: Right;  . AORTOGRAM N/A 09/26/2016   Procedure: AORTOGRAM;  Surgeon: Waynetta Sandy, MD;  Location:  [facility] OR;  Service: Vascular;  Laterality: N/A;  . CARPAL TUNNEL RELEASE Right   . CATARACT EXTRACTION W/ INTRAOCULAR LENS  IMPLANT, BILATERAL Bilateral   . COLONOSCOPY    . DILATION AND CURETTAGE OF UTERUS    . DRESSING CHANGE UNDER ANESTHESIA Right 01/16/2017   Procedure: DRESSING CHANGE RIGHT BELOW KNEE AMPUTATION;   Surgeon: Serafina Mitchell, MD;  Location: [place];  Service: Vascular;  Laterality: Right;  . EYE SURGERY Right    "macular OR"  . FEMORAL ARTERY STENT  12-11-10   Left SFA  . FEMORAL-POPLITEAL BYPASS GRAFT Left 09/24/2016   Procedure: REDO FEMORAL TO POPLITEAL ARTERY BYPASS GRAFT USING 6MM PROPATEN RINGED GORTEX GRAFT;  Surgeon: Serafina Mitchell, MD;  Location: [place];  Service: Vascular;  Laterality: Left;  . FEMORAL-TIBIAL BYPASS GRAFT Left 01/16/2017   Procedure: REDO BYPASS GRAFT FEMORAL-TIBIAL ARTERY WITH GORTEX GRAFT;  Surgeon: Serafina Mitchell, MD;  Location: [place];  Service: Vascular;  Laterality: Left;  AND LOWER LEG   . I&D EXTREMITY Right 12/17/2016   Procedure: IRRIGATION AND DEBRIDEMENT RIGHT FOOT;  Surgeon: Serafina Mitchell, MD;  Location: [place];  Service: Vascular;  Laterality: Right;  . INCISION AND DRAINAGE OF WOUND Left 10/25/2009   leg/notes 11/13/2009  . INSERTION OF ILIAC STENT Left 12/26/2014   Procedure: INSERTION OF ILIAC STENT;  Surgeon: Serafina Mitchell, MD;  Location: [facility] CATH LAB;  Service: Cardiovascular;  Laterality: Left;  . INSERTION OF ILIAC STENT Left 09/26/2016   Procedure: SUB INTIMAL INSERTION OF SUPERFICIAL FEMORAL ARTERY AND BELOW KNEE BYPASS GRAFT;  Surgeon: Waynetta Sandy, MD;  Location: [place];  Service: Vascular;  Laterality: Left;  . JOINT REPLACEMENT     knee  . JOINT REPLACEMENT Left Oct. 17, 2014   Elbow ( pt fell 08-31-13 )  . ORIF SHOULDER FRACTURE Right    "it was crushed"  . PERIPHERAL VASCULAR CATHETERIZATION N/A 10/30/2015   Procedure: Abdominal Aortogram w/Lower Extremity;  Surgeon: Serafina Mitchell, MD;  Location: [place] CV LAB;  Service: Cardiovascular;  Laterality: N/A;  . PERIPHERAL VASCULAR CATHETERIZATION  10/30/2015   Procedure: Peripheral Vascular Intervention;  Surgeon: Serafina Mitchell, MD;  Location: [place] CV LAB;  Service: Cardiovascular;;  . PERIPHERAL VASCULAR CATHETERIZATION N/A 04/01/2016   Procedure: Abdominal  Aortogram w/Lower Extremity;  Surgeon: Serafina Mitchell, MD;  Location: [place] CV LAB;  Service: Cardiovascular;  Laterality: N/A;  . PERIPHERAL VASCULAR CATHETERIZATION Left 04/01/2016   Procedure: Peripheral Vascular Atherectomy;  Surgeon: Serafina Mitchell, MD;  Location: [place] CV LAB;  Service: Cardiovascular;  Laterality: Left;  Superficial femoral artery.  Marland Kitchen PERIPHERAL VASCULAR CATHETERIZATION Right 09/05/2016   "stent"  . PERIPHERAL VASCULAR CATHETERIZATION N/A 09/05/2016   Procedure: Abdominal Aortogram w/Lower Extremity;  Surgeon: Serafina Mitchell, MD;  Location: [place] CV LAB;  Service: Cardiovascular;  Laterality: N/A;  . PERIPHERAL VASCULAR CATHETERIZATION Right 09/05/2016   Procedure: Peripheral Vascular Intervention;  Surgeon: Serafina Mitchell, MD;  Location: [place] CV LAB;  Service: Cardiovascular;  Laterality: Right;  Superficial Femoral  . PERIPHERAL VASCULAR CATHETERIZATION Left 09/09/2016   Procedure: Lower Extremity Angiography;  Surgeon: Serafina Mitchell, MD;  Location: [place] CV LAB;  Service: Cardiovascular;  Laterality: Left;  . PR VEIN BYPASS GRAFT,AORTO-FEM-POP  09-13-09   Left Fem-pop  . THROMBECTOMY FEMORAL ARTERY Right 09/26/2016   Procedure: Thromboembolectomy Right Lower Extremity, Right Femoral Artery Endarterectomy with Patch Angioplasty; Right Lower Extremity Angiogram ;  Surgeon:  Waynetta Sandy, MD;  Location: [place];  Service: Vascular;  Laterality: Right;  . TOTAL ELBOW ARTHROPLASTY Left 09/03/2013   Procedure: LEFT TOTAL ELBOW ARTHROPLASTY;  Surgeon: Roseanne Kaufman, MD;  Location: [place];  Service: Orthopedics;  Laterality: Left;  . TOTAL KNEE ARTHROPLASTY Left 06/2006  . TRANSMETATARSAL AMPUTATION Right 12/11/2016   Procedure: TRANSMETATARSAL AMPUTATION;  Surgeon: Serafina Mitchell, MD;  Location: [place];  Service: Vascular;  Laterality: Right;  . TUBAL LIGATION    . VAGINAL HYSTERECTOMY     Family History  Problem Relation Age of  Onset  . Heart disease Father     Heart Disease before age 29  . Hyperlipidemia Father   . Hypertension Father   . Alcohol abuse Father   . Heart disease Brother   . Hyperlipidemia Brother   . Hypertension Brother   . Deep vein thrombosis Brother   . Heart disease Son     Heart Disease before age 77  . Hyperlipidemia Son   . Hypertension Son   . Heart attack Son   . Diabetes Son   . Hypertension Son   . Hyperlipidemia Sister   . Hypertension Sister    Social History:  reports that she quit smoking about 70 years ago. Her smoking use included Cigarettes. She has never used smokeless tobacco. She reports that she does not drink alcohol or use drugs. Allergies:  Allergies  Allergen Reactions  . Motrin [Ibuprofen] Other (See Comments)    ADVERSE REACTION - GI BLEED  . Statins Other (See Comments)    ADVERSE REACTION MUSCLE PAIN & WEAKNESS  . Morphine And Related Other (See Comments)    HALLUCINATIONS REACTION IS SIDE EFFECT  . Oxycontin [Oxycodone Hcl] Other (See Comments)    [REACTION IS SIDE EFFECT]  > HALLUCINATIONS  . Promethazine Hcl Other (See Comments)    IV  Drug only, makes her act crazy  . Sulfa Antibiotics Nausea And Vomiting   Medications Prior to Admission  Medication Sig Dispense Refill  . acetaminophen (TYLENOL) 500 MG tablet Take 1,000 mg by mouth 2 (two) times daily as needed for moderate pain or headache. Patient took this medication for her pain.    . Cholecalciferol (VITAMIN D3) 2000 UNITS TABS Take 2,000 Units by mouth at bedtime.     . clopidogrel (PLAVIX) 75 MG tablet Take 1 tablet (75 mg total) by mouth daily. 30 tablet 1  . Cyanocobalamin (VITAMIN B-12 PO) Take 1 tablet by mouth daily.    . diazepam (VALIUM) 5 MG tablet Take 0.5 tablets (2.5 mg total) by mouth at bedtime as needed for anxiety. 15 tablet 0  . fentaNYL (DURAGESIC - DOSED MCG/HR) 12 MCG/HR Place 1 patch (12.5 mcg total) onto the skin every 3 (three) days. 15 patch 0  . gabapentin  (NEURONTIN) 300 MG capsule Take 1 capsule (300 mg total) by mouth daily at 8 pm. 30 capsule 1  . Hydrocortisone (GERHARDT'S BUTT CREAM) CREA Apply 1 application topically 2 (two) times daily. 1 each 1  . loperamide (IMODIUM) 2 MG capsule Take 2 mg by mouth as needed for diarrhea or loose stools.    . methocarbamol (ROBAXIN) 500 MG tablet Take 1 tablet (500 mg total) by mouth every 6 (six) hours as needed for muscle spasms. 60 tablet 0  . metoprolol tartrate (LOPRESSOR) 25 MG tablet Take 1 tablet (25 mg total) by mouth at bedtime. 30 tablet 1  . oxyCODONE (OXY IR/ROXICODONE) 5 MG immediate release tablet Take 1 tablet (  5 mg total) by mouth every 4 (four) hours as needed for moderate pain, severe pain or breakthrough pain. 30 tablet 0  . pantoprazole (PROTONIX) 40 MG tablet Take 1 tablet (40 mg total) by mouth daily. 30 tablet 1  . potassium chloride (K-DUR,KLOR-CON) 10 MEQ tablet Take 1 tablet (10 mEq total) by mouth daily. 30 tablet 0  . valsartan-hydrochlorothiazide (DIOVAN-HCT) 160-12.5 MG tablet Take 0.5 tablets by mouth daily. 30 tablet 0  . Multiple Vitamins-Minerals (EYE VITAMINS PO) Take 1 tablet by mouth at bedtime.       Home: Home Living Family/patient expects to be discharged to:: Private residence Living Arrangements: Spouse/significant other Available Help at Discharge: Family, Available 24 hours/day Type of Home: House Home Access: [place]: One level Bathroom Shower/Tub: Gaffer, Other (comment), Door Bathroom Toilet: Standard Home Equipment: Environmental consultant - 2 wheels, Bedside commode, Cane - single point, Grab bars - tub/shower, Wheelchair - manual, Other (comment), Shower seat (BSC x2) Additional Comments: pt family is planning for 24/7 supervision as needed. Pt sleeps in a recliner since elbow replacement years ago  Lives With: Spouse  Functional History: Prior Function Level of Independence: Needs assistance Gait / Transfers Assistance Needed: Pt has  been standing to pivot with assist since BKA, propelling WC 100', does not prefer sliding board ADL's / Homemaking Assistance Needed: assist with LB ADLs and shower transfer Functional Status:  Mobility: Bed Mobility Overal bed mobility: Needs Assistance Bed Mobility: Rolling Rolling: Mod assist Supine to sit: Mod assist Sit to supine: Max assist General bed mobility comments: increased time with assist to elevate trunk, move legs and use of bed to fully scoot to EOB, Max assist with assist for full body translation from sitting to supine. 2 person assist to scoot to [facility] Transfers Overall transfer level: Needs assistance Transfers: Sit to/from Stand Sit to Stand: Total assist General transfer comment: max multimodal cues with pt with tendency for posterior lean, limited ability to block and utilize knee for assist due to sensitive skin. Use of bed pad and bed elevation for rise and anterior translation. Able to place weight on LLE momentarily before sitting limited by pain. Max assist to scoot back onto surface fully. Attempted to stand 3x total with one successful attempt Ambulation/Gait General Gait Details: unable    ADL: ADL Overall ADL's : Needs assistance/impaired Grooming: Wash/dry face, Brushing hair, Set up, Bed level, Supervision/safety Lower Body Dressing: Bed level, Maximal assistance Toilet Transfer Details (indicate cue type and reason): rolled in bed so bedpan could be positioned under her-Mod A for rolling ' Toileting- Clothing Manipulation and Hygiene: Bed level, Maximal assistance Toileting - Clothing Manipulation Details (indicate cue type and reason): pt able to wash peri area with washcloth General ADL Comments: cues for precautions for LUE.  Cognition: Cognition Overall Cognitive Status:  (unsure of baseline) Orientation Level: Oriented X4 Cognition Arousal/Alertness: Awake/alert Behavior During Therapy: Flat affect Overall Cognitive Status:  (unsure of  baseline) Area of Impairment: Awareness Memory: Decreased short-term memory Awareness:  (pt thinking she was going to fall and she was lying in bed) General Comments: pt does not recall BKA  Blood pressure (!) 141/60, pulse 87, temperature 97.7 F (36.5 C), temperature source Oral, resp. rate 18, height 5\' 2"  (1.575 m), weight 53.3 kg (117 lb 8.1 oz), SpO2 100 %. Physical Exam  Vitals reviewed. Constitutional: She is oriented to person, place, and time. She appears well-developed.  Frail  HENT:  Head: Normocephalic and atraumatic.  Eyes: Conjunctivae and  EOM are normal.  Neck: Normal range of motion. Neck supple. No thyromegaly present.  Cardiovascular: Normal rate and regular rhythm.   Respiratory: Effort normal and breath sounds normal. No respiratory distress.  GI: Soft. Bowel sounds are normal. She exhibits no distension.  Musculoskeletal: She exhibits edema and tenderness.  Right BKA  Neurological: She is alert and oriented to person, place, and time.  Motor: B/l UE 4/5 RLE: HF 3+/5 LLE: 3/5 (pain inhibition)  Skin:  Right BKA site is dressed, c/d/i.  Left lower extremity bypass site clean and dry appropriately tender Incision on LLE c/d/i.  Psychiatric: She has a normal mood and affect. Her behavior is normal.    Results for orders placed or performed during the hospital encounter of 01/13/17 (from the past 24 hour(s))  CBC     Status: Abnormal   Collection Time: 01/20/17  2:10 AM  Result Value Ref Range   WBC 5.4 4.0 - 10.5 K/uL   RBC 2.47 (L) 3.87 - 5.11 MIL/uL   Hemoglobin 7.2 (L) 12.0 - 15.0 g/dL   HCT 22.7 (L) 36.0 - 46.0 %   MCV 91.9 78.0 - 100.0 fL   MCH 29.1 26.0 - 34.0 pg   MCHC 31.7 30.0 - 36.0 g/dL   RDW 16.1 (H) 11.5 - 15.5 %   Platelets 211 150 - 400 K/uL   No results found.  Assessment/Plan: Diagnosis: Occluded left leg bypass status post redo femoral artery exposure 01/16/2017 as well as history of right BKA 12/23/2016 Labs independently  reviewed.  Records reviewed and summated above.  1. Does the need for close, 24 hr/day medical supervision in concert with the patient's rehab needs make it unreasonable for this patient to be served in a less intensive setting? Potentially 2. Co-Morbidities requiring supervision/potential complications: right elbow replacement, atrial fibrillation (cont meds, monitor HR with increased activity), GI bleed with ABLA (transfuse if necessary to ensure appropriate perfusion for increased activity tolerance), chronic low back pain with fibromyalgia with post-op pain (Biofeedback training with therapies to help reduce reliance on opiate pain medications, particularly IV dilaudid, fentanyl, monitor pain control during therapies, and sedation at rest and titrate to maximum efficacy to ensure participation and gains in therapies), chronic kidney disease stage III (avoid nephrotoxic meds), HTN (monitor and provide prns in accordance with increased physical exertion and pain), peripheral vascular disease (cont meds), right BKA, ?UTI (consider Ucx) 3. Due to bowel management, safety, skin/wound care, disease management, medication administration, pain management and patient education, does the patient require 24 hr/day rehab nursing? Yes 4. Does the patient require coordinated care of a physician, rehab nurse, PT (1-2 hrs/day, 5 days/week) and OT (1-2 hrs/day, 5 days/week) to address physical and functional deficits in the context of the above medical diagnosis(es)? Yes Addressing deficits in the following areas: balance, endurance, locomotion, strength, transferring, bathing, dressing, toileting and psychosocial support 5. Can the patient actively participate in an intensive therapy program of at least 3 hrs of therapy per day at least 5 days per week? Potentially 6. The potential for patient to make measurable gains while on inpatient rehab is excellent 7. Anticipated functional outcomes upon discharge from inpatient  rehab are min assist  with PT, min assist with OT, n/a with SLP. 8. Estimated rehab length of stay to reach the above functional goals is: 5-7 days. 9. Does the patient have adequate social supports and living environment to accommodate these discharge functional goals? Yes 10. Anticipated D/C setting: Home 11. Anticipated post D/C treatments:  HH therapy and Home excercise program 12. Overall Rehab/Functional Prognosis: good  RECOMMENDATIONS: This patient's condition is appropriate for continued rehabilitative care in the following setting: Pt with signficant pain limiting functioning.  Recommend reeval by therapies as it has been several days when pain better controlled. Patient may be near baseline level of functioning.  Patient has agreed to participate in recommended program. Potentially Note that insurance prior authorization may be required for reimbursement for recommended care.  Comment: Rehab Admissions Coordinator to follow up.  Delice Lesch, MD, Mellody Drown Cathlyn Parsons., PA-C 01/20/2017

## 2017-01-20 NOTE — Care Management Important Message (Signed)
Important Message  Patient Details  Name: [name] MRN: [id number] Date of Birth: 1927/12/07   Medicare Important Message Given:  Yes    [name] Abena 01/20/2017, 11:13 AM

## 2017-01-20 NOTE — Progress Notes (Signed)
Occupational Therapy Treatment Patient Details Name: [name] MRN: [id number] DOB: [birth year] Today's Date: 01/20/2017    History of present illness Pt is an 83 y.o. 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 PMHx: Anginal pain, Arthritis, Chronic lower back pain, DVT, Fibromyalgia, GERD, High cholesterol, HTN, Macular degeneration, PVD, TIA.    OT comments  Pt appears anxious about moving. Pt painful with any movement regarding LLE - has difficulty weight bearing through L foot during transfer and required Max A +_2 for stand pivot transfer. Max A with LB ADL. Continue to recommend CIR to maximize current level of function and reduce burden of care on caregivers. Will continue to follow acutely to address established goals and facilitate DC to next venue of care.   Follow Up Recommendations  CIR    Equipment Recommendations  Other (comment)    Recommendations for Other Services Rehab consult    Precautions / Restrictions Precautions Precautions: Fall Precaution Comments: pt not to push or pull with Lt elbow more than 5 lbs (s/p total elbow replacement)  Restrictions RLE Weight Bearing: Non weight bearing       Mobility Bed Mobility Overal bed mobility: Needs Assistance Bed Mobility: Sit to Supine     Supine to sit: Min assist Sit to supine: Max assist   General bed mobility comments: Pt unable to problem solve how to recline into bed.  Transfers Overall transfer level: Needs assistance Equipment used: 2 person hand held assist Transfers: Sit to/from [facility] Sit to Stand: Max assist;+2 physical assistance Stand pivot transfers: Max assist;+2 physical assistance       General transfer comment: Daughter expressed concerns regarding L ankle supinating during trnasfer. Could not observe due to positioning with pt. Daughter states her mom's "foot would roll over because  the foot was numb at the base of the big toe".Wiill furtehr assess.     Balance Overall balance assessment: Needs assistance   Sitting balance-Leahy Scale: Fair Sitting balance - Comments: posterior bias when challenged     Standing balance-Leahy Scale: Zero                     ADL Overall ADL's : Needs assistance/impaired                     Lower Body Dressing: Maximal assistance;Bed level               Functional mobility during ADLs: +2 for physical assistance;Maximal assistance General ADL Comments: Asked by nursing to help pt back to bed. Pt painful and anxious about moving. Pt had difficulty touching LLE to floor. Knee eventually achieved @ 90 degrees flexion. Pt unable to push/pull with LUE  and limited use R shoulder due to replacement per daughter. Max A to scoot hips forward in chair. Max A +2 to stand pivot pt to bed from lower recliner using gait belt with Pt holding therapist's and tech's arms and use of bedpad to pivot to bed. Max A to recline in bed      Vision                 Additional Comments: macular degeneration per chart   Perception     Praxis      Cognition   Behavior During Therapy: Flat affect;Anxious Overall Cognitive Status: Impaired/Different from baseline Area of Impairment: Memory;Safety/judgement  Memory: Decreased short-term memory    Safety/Judgement: Decreased awareness of deficits Awareness: Emergent          Exercises General Exercises - Lower Extremity Long Arc Quad: AROM;Seated;10 reps;Both Hip Flexion/Marching: AROM;Both;Seated;10 reps   Shoulder Instructions       General Comments      Pertinent Vitals/ Pain       Pain Assessment: Faces Faces Pain Scale: Hurts even more Pain Location: LLE with movement Pain Descriptors / Indicators: Discomfort;Grimacing;Moaning Pain Intervention(s): Limited activity within patient's tolerance;Repositioned  Home Living                                           Prior Functioning/Environment              Frequency  Min 2X/week        Progress Toward Goals  OT Goals(current goals can now be found in the care plan section)  Progress towards OT goals: Progressing toward goals  Acute Rehab OT Goals Patient Stated Goal: go home OT Goal Formulation: With patient Time For Goal Achievement: 01/25/17 Potential to Achieve Goals: Good ADL Goals Pt Will Perform Lower Body Bathing: with mod assist;sitting/lateral leans;sit to/from stand Pt Will Perform Lower Body Dressing: with mod assist;sitting/lateral leans;sit to/from stand Pt Will Transfer to Toilet: with mod assist;with +2 assist;stand pivot transfer;bedside commode;squat pivot transfer Pt Will Perform Toileting - Clothing Manipulation and hygiene: with supervision;sitting/lateral leans Additional ADL Goal #1: Pt will perform bed mobility with Min A.   Plan Discharge plan remains appropriate    Co-evaluation                 End of Session Equipment Utilized During Treatment: Gait belt  OT Visit Diagnosis: Other abnormalities of gait and mobility (R26.89);Pain Pain - Right/Left: Left Pain - part of body: Leg   Activity Tolerance Patient limited by pain   Patient Left in bed;with call bell/phone within reach;with family/visitor present   Nurse Communication Mobility status        Time: 1345-1410 OT Time Calculation (min): 25 min  Charges: OT General Charges $OT Visit: 1 Procedure OT Treatments $Self Care/Home Management : 23-37 mins  [facility], OT/L  [id number] 01/20/2017   Nyelle Wolfson,HILLARY 01/20/2017, 4:00 PM

## 2017-01-20 NOTE — Progress Notes (Signed)
Physical Therapy Treatment Patient Details Name: [name] MRN: [id number] DOB: [birth year] Today's Date: 01/20/2017    History of Present Illness Pt is an 83 y.o. 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 PMHx: Anginal pain, Arthritis, Chronic lower back pain, DVT, Fibromyalgia, GERD, High cholesterol, HTN, Macular degeneration, PVD, TIA.     PT Comments    Pt agreeable to mobility today with report of decreased pain and able to move bil LE in bed throughout the day. Pt concerned over need for BM with request of BSC. Pt with minimal assist into standing on LLE today with knee blocked and required max sequential cues with decreased memory and safety awareness. Pt with delayed response to commands for HEP and mobility. Pt was however able to tolerate transfers and initiation of weight bearing on LLE. Will continue to follow to maximize activity. Do not recommend nursing attempt stand pivots at this time and utilize sliding board.     Follow Up Recommendations  CIR;Supervision/Assistance - 24 hour     Equipment Recommendations       Recommendations for Other Services       Precautions / Restrictions Precautions Precautions: Fall Precaution Comments: pt not to push or pull with Lt elbow more than 5 lbs (s/p total elbow replacement)  Restrictions RLE Weight Bearing: Non weight bearing    Mobility  Bed Mobility Overal bed mobility: Needs Assistance Bed Mobility: Supine to Sit     Supine to sit: Min assist     General bed mobility comments: increased time with assist to elevate trunk, and scoot sacrum to EOB with cues for sequence  Transfers Overall transfer level: Needs assistance   Transfers: Sit to/from Stand;Stand Pivot Transfers Sit to Stand: Max assist Stand pivot transfers: Max assist;+2 physical assistance;+2 safety/equipment       General transfer comment: with left knee  blocked, cues for hand placement, assist for anterior translation and rise with Max assist +2 for safety with standing from bed and pivot to [facility] as well as bSC to recliner. Pt not moving LLE with transfers and requires max assist to guide and control pelvis to pivot. Mod assist with pad to scoot back in chair  Ambulation/Gait             General Gait Details: unable   Stairs            Wheelchair Mobility    Modified Rankin (Stroke Patients Only)       Balance Overall balance assessment: Needs assistance   Sitting balance-Leahy Scale: Fair       Standing balance-Leahy Scale: Zero                      Cognition Arousal/Alertness: Awake/alert Behavior During Therapy: Flat affect Overall Cognitive Status: Impaired/Different from baseline Area of Impairment: Memory;Safety/judgement     Memory: Decreased short-term memory   Safety/Judgement: Decreased awareness of deficits          Exercises General Exercises - Lower Extremity Long Arc Quad: AROM;Seated;10 reps;Both Hip Flexion/Marching: AROM;Both;Seated;10 reps    General Comments        Pertinent Vitals/Pain Pain Score: 5  Pain Location: LLE with movement Pain Descriptors / Indicators: Sore Pain Intervention(s): Limited activity within patient's tolerance;Repositioned;Monitored during session;Premedicated before session    Home Living  Prior Function            PT Goals (current goals can now be found in the care plan section) Progress towards PT goals: Progressing toward goals    Frequency           PT Plan Current plan remains appropriate    Co-evaluation             End of Session Equipment Utilized During Treatment: Gait belt Activity Tolerance: Patient tolerated treatment well Patient left: in chair;with call bell/phone within reach;with family/visitor present Nurse Communication: Mobility status;Precautions;Weight bearing status PT  Visit Diagnosis: Muscle weakness (generalized) (M62.81);Other abnormalities of gait and mobility (R26.89);Pain Pain - Right/Left: Left Pain - part of body: Leg     Time: 1129-1201 PT Time Calculation (min) (ACUTE ONLY): 32 min  Charges:  $Therapeutic Exercise: 8-22 mins $Therapeutic Activity: 8-22 mins                    G Codes:       Devaeh Amadi B Terrel Manalo 01/24/17, 2:22 PM  Elwyn Reach, [place]

## 2017-01-20 NOTE — Progress Notes (Signed)
Patient and daughter refused Ducolax Supp. Will take in the morning.

## 2017-01-20 NOTE — Progress Notes (Signed)
I met with pt, her son and daughter at bedside. Pt assessed by P.T. Today and she is max assist with transfers which is not her baseline. I will discuss with Rehab MD to hopefully readmit pt to inpt rehab in the next 24- 48 hrs pending bed availability and Rehab MD approval. [phone]

## 2017-01-20 NOTE — Plan of Care (Signed)
Problem: Bowel/Gastric: Goal: Will not experience complications related to bowel motility Outcome: Not Progressing Patient has no documented Bowel movement. Cannot remember if she had a bowel movement on 01/18/17 or not. Ducolax offered but declined by patient and daughter. Will prefer in the morning.

## 2017-01-21 ENCOUNTER — Inpatient Hospital Stay (HOSPITAL_COMMUNITY)
Admission: RE | Admit: 2017-01-21 | Discharge: 2017-01-28 | DRG: 561 | Disposition: A | Discharge status: Home Health | Payer: Medicare Other | Source: Intra-hospital | Attending: Physical Medicine & Rehabilitation | Admitting: Physical Medicine & Rehabilitation

## 2017-01-21 DIAGNOSIS — I129 Hypertensive chronic kidney disease with stage 1 through stage 4 chronic kidney disease, or unspecified chronic kidney disease: Secondary | ICD-10-CM | POA: Diagnosis present

## 2017-01-21 DIAGNOSIS — Z89511 Acquired absence of right leg below knee: Secondary | ICD-10-CM

## 2017-01-21 DIAGNOSIS — Z888 Allergy status to other drugs, medicaments and biological substances status: Secondary | ICD-10-CM | POA: Diagnosis not present

## 2017-01-21 DIAGNOSIS — I739 Peripheral vascular disease, unspecified: Secondary | ICD-10-CM | POA: Diagnosis present

## 2017-01-21 DIAGNOSIS — Z87891 Personal history of nicotine dependence: Secondary | ICD-10-CM

## 2017-01-21 DIAGNOSIS — Z9841 Cataract extraction status, right eye: Secondary | ICD-10-CM | POA: Diagnosis not present

## 2017-01-21 DIAGNOSIS — E876 Hypokalemia: Secondary | ICD-10-CM | POA: Diagnosis not present

## 2017-01-21 DIAGNOSIS — H353290 Exudative age-related macular degeneration, unspecified eye, stage unspecified: Secondary | ICD-10-CM | POA: Diagnosis present

## 2017-01-21 DIAGNOSIS — Z4781 Encounter for orthopedic aftercare following surgical amputation: Principal | ICD-10-CM

## 2017-01-21 DIAGNOSIS — Z961 Presence of intraocular lens: Secondary | ICD-10-CM | POA: Diagnosis present

## 2017-01-21 DIAGNOSIS — Z8673 Personal history of transient ischemic attack (TIA), and cerebral infarction without residual deficits: Secondary | ICD-10-CM

## 2017-01-21 DIAGNOSIS — Z90710 Acquired absence of both cervix and uterus: Secondary | ICD-10-CM | POA: Diagnosis not present

## 2017-01-21 DIAGNOSIS — Z886 Allergy status to analgesic agent status: Secondary | ICD-10-CM

## 2017-01-21 DIAGNOSIS — K5901 Slow transit constipation: Secondary | ICD-10-CM

## 2017-01-21 DIAGNOSIS — E7800 Pure hypercholesterolemia, unspecified: Secondary | ICD-10-CM | POA: Diagnosis present

## 2017-01-21 DIAGNOSIS — G8918 Other acute postprocedural pain: Secondary | ICD-10-CM

## 2017-01-21 DIAGNOSIS — D649 Anemia, unspecified: Secondary | ICD-10-CM | POA: Diagnosis present

## 2017-01-21 DIAGNOSIS — D62 Acute posthemorrhagic anemia: Secondary | ICD-10-CM | POA: Diagnosis not present

## 2017-01-21 DIAGNOSIS — Z7902 Long term (current) use of antithrombotics/antiplatelets: Secondary | ICD-10-CM | POA: Diagnosis not present

## 2017-01-21 DIAGNOSIS — M797 Fibromyalgia: Secondary | ICD-10-CM | POA: Diagnosis present

## 2017-01-21 DIAGNOSIS — Z9842 Cataract extraction status, left eye: Secondary | ICD-10-CM

## 2017-01-21 DIAGNOSIS — Z882 Allergy status to sulfonamides status: Secondary | ICD-10-CM | POA: Diagnosis not present

## 2017-01-21 DIAGNOSIS — I4891 Unspecified atrial fibrillation: Secondary | ICD-10-CM | POA: Diagnosis present

## 2017-01-21 DIAGNOSIS — L89150 Pressure ulcer of sacral region, unstageable: Secondary | ICD-10-CM | POA: Diagnosis not present

## 2017-01-21 DIAGNOSIS — N183 Chronic kidney disease, stage 3 (moderate): Secondary | ICD-10-CM | POA: Diagnosis present

## 2017-01-21 DIAGNOSIS — E46 Unspecified protein-calorie malnutrition: Secondary | ICD-10-CM | POA: Diagnosis not present

## 2017-01-21 DIAGNOSIS — M792 Neuralgia and neuritis, unspecified: Secondary | ICD-10-CM | POA: Diagnosis not present

## 2017-01-21 DIAGNOSIS — Z885 Allergy status to narcotic agent status: Secondary | ICD-10-CM | POA: Diagnosis not present

## 2017-01-21 DIAGNOSIS — I10 Essential (primary) hypertension: Secondary | ICD-10-CM | POA: Diagnosis not present

## 2017-01-21 DIAGNOSIS — K5900 Constipation, unspecified: Secondary | ICD-10-CM | POA: Diagnosis present

## 2017-01-21 DIAGNOSIS — R5381 Other malaise: Secondary | ICD-10-CM | POA: Diagnosis present

## 2017-01-21 DIAGNOSIS — Z8249 Family history of ischemic heart disease and other diseases of the circulatory system: Secondary | ICD-10-CM

## 2017-01-21 DIAGNOSIS — T148XXA Other injury of unspecified body region, initial encounter: Secondary | ICD-10-CM | POA: Diagnosis not present

## 2017-01-21 DIAGNOSIS — M810 Age-related osteoporosis without current pathological fracture: Secondary | ICD-10-CM | POA: Diagnosis present

## 2017-01-21 DIAGNOSIS — Z96652 Presence of left artificial knee joint: Secondary | ICD-10-CM | POA: Diagnosis present

## 2017-01-21 DIAGNOSIS — Z85828 Personal history of other malignant neoplasm of skin: Secondary | ICD-10-CM

## 2017-01-21 DIAGNOSIS — Z79899 Other long term (current) drug therapy: Secondary | ICD-10-CM

## 2017-01-21 DIAGNOSIS — G5732 Lesion of lateral popliteal nerve, left lower limb: Secondary | ICD-10-CM | POA: Diagnosis not present

## 2017-01-21 LAB — CBC
HEMATOCRIT: 23.5 % — AB (ref 36.0–46.0)
HEMATOCRIT: 25.8 % — AB (ref 36.0–46.0)
HEMOGLOBIN: 8.2 g/dL — AB (ref 12.0–15.0)
Hemoglobin: 7.7 g/dL — ABNORMAL LOW (ref 12.0–15.0)
MCH: 29.7 pg (ref 26.0–34.0)
MCH: 28.7 pg (ref 26.0–34.0)
MCHC: 32.8 g/dL (ref 30.0–36.0)
MCHC: 31.8 g/dL (ref 30.0–36.0)
MCV: 90.7 fL (ref 78.0–100.0)
MCV: 90.2 fL (ref 78.0–100.0)
Platelets: 246 10*3/uL (ref 150–400)
Platelets: 287 10*3/uL (ref 150–400)
RBC: 2.59 MIL/uL — ABNORMAL LOW (ref 3.87–5.11)
RBC: 2.86 MIL/uL — AB (ref 3.87–5.11)
RDW: 15.8 % — AB (ref 11.5–15.5)
RDW: 15.9 % — ABNORMAL HIGH (ref 11.5–15.5)
WBC: 6.4 10*3/uL (ref 4.0–10.5)
WBC: 8.6 10*3/uL (ref 4.0–10.5)

## 2017-01-21 LAB — CREATININE, SERUM: CREATININE: 0.81 mg/dL (ref 0.44–1.00)

## 2017-01-21 MED ORDER — IRBESARTAN 75 MG PO TABS
75.0000 mg | ORAL_TABLET | Freq: Every day | ORAL | Status: DC
  Administered 2017-01-22 – 2017-01-28 (×7): 75 mg via ORAL
  Filled 2017-01-21 (×7): qty 1

## 2017-01-21 MED ORDER — ENSURE ENLIVE PO LIQD: 237.0000 mL | Freq: Two times a day (BID) | ORAL | Status: DC

## 2017-01-21 MED ORDER — GABAPENTIN 300 MG PO CAPS
300.0000 mg | ORAL_CAPSULE | Freq: Every day | ORAL | Status: DC
  Administered 2017-01-21 – 2017-01-23 (×3): 300 mg via ORAL
  Filled 2017-01-21 (×3): qty 1

## 2017-01-21 MED ORDER — OXYCODONE HCL 5 MG PO TABS
5.0000 mg | ORAL_TABLET | ORAL | Status: DC | PRN
  Administered 2017-01-22 – 2017-01-25 (×8): 5 mg via ORAL
  Filled 2017-01-21 (×8): qty 1

## 2017-01-21 MED ORDER — VITAMIN D 1000 UNITS PO TABS
2000.0000 [IU] | ORAL_TABLET | Freq: Every day | ORAL | Status: DC
  Administered 2017-01-21 – 2017-01-27 (×7): 2000 [IU] via ORAL
  Filled 2017-01-21 (×8): qty 2

## 2017-01-21 MED ORDER — GERHARDT'S BUTT CREAM
1.0000 "application " | TOPICAL_CREAM | Freq: Two times a day (BID) | CUTANEOUS | Status: DC
  Administered 2017-01-21 – 2017-01-28 (×13): 1 via TOPICAL
  Filled 2017-01-21: qty 1

## 2017-01-21 MED ORDER — MILK AND MOLASSES ENEMA
1.0000 | Freq: Once | RECTAL | Status: AC
  Administered 2017-01-21: 250 mL via RECTAL
  Filled 2017-01-21: qty 250

## 2017-01-21 MED ORDER — METHOCARBAMOL 500 MG PO TABS
500.0000 mg | ORAL_TABLET | Freq: Four times a day (QID) | ORAL | Status: DC | PRN
  Administered 2017-01-22 – 2017-01-28 (×9): 500 mg via ORAL
  Filled 2017-01-21 (×12): qty 1

## 2017-01-21 MED ORDER — DOCUSATE SODIUM 100 MG PO CAPS
100.0000 mg | ORAL_CAPSULE | Freq: Every day | ORAL | Status: DC
  Administered 2017-01-22 – 2017-01-28 (×7): 100 mg via ORAL
  Filled 2017-01-21 (×7): qty 1

## 2017-01-21 MED ORDER — FENTANYL 12 MCG/HR TD PT72
12.5000 ug | MEDICATED_PATCH | TRANSDERMAL | Status: DC
  Administered 2017-01-23 – 2017-01-26 (×2): 12.5 ug via TRANSDERMAL
  Filled 2017-01-21 (×2): qty 1

## 2017-01-21 MED ORDER — POTASSIUM CHLORIDE CRYS ER 10 MEQ PO TBCR
10.0000 meq | EXTENDED_RELEASE_TABLET | Freq: Every day | ORAL | Status: DC
  Administered 2017-01-22 – 2017-01-28 (×7): 10 meq via ORAL
  Filled 2017-01-21 (×7): qty 1

## 2017-01-21 MED ORDER — VITAMIN B-12 100 MCG PO TABS
100.0000 ug | ORAL_TABLET | Freq: Every day | ORAL | Status: DC
  Administered 2017-01-22 – 2017-01-28 (×7): 100 ug via ORAL
  Filled 2017-01-21 (×7): qty 1

## 2017-01-21 MED ORDER — ENOXAPARIN SODIUM 40 MG/0.4ML ~~LOC~~ SOLN
40.0000 mg | SUBCUTANEOUS | Status: DC
  Administered 2017-01-22 – 2017-01-28 (×7): 40 mg via SUBCUTANEOUS
  Filled 2017-01-21 (×7): qty 0.4

## 2017-01-21 MED ORDER — ENOXAPARIN SODIUM 40 MG/0.4ML ~~LOC~~ SOLN: 40.0000 mg | SUBCUTANEOUS | Status: DC

## 2017-01-21 MED ORDER — PREMIER PROTEIN SHAKE
11.0000 [oz_av] | Freq: Two times a day (BID) | ORAL | Status: DC
  Filled 2017-01-21 (×3): qty 325.31

## 2017-01-21 MED ORDER — SORBITOL 70 % SOLN: 30.0000 mL | Freq: Every day | Status: DC | PRN

## 2017-01-21 MED ORDER — METOPROLOL TARTRATE 25 MG PO TABS
25.0000 mg | ORAL_TABLET | Freq: Every day | ORAL | Status: DC
  Administered 2017-01-21 – 2017-01-27 (×7): 25 mg via ORAL
  Filled 2017-01-21 (×7): qty 1

## 2017-01-21 MED ORDER — BISACODYL 10 MG RE SUPP: 10.0000 mg | Freq: Every day | RECTAL | Status: DC | PRN

## 2017-01-21 MED ORDER — PANTOPRAZOLE SODIUM 40 MG PO TBEC
40.0000 mg | DELAYED_RELEASE_TABLET | Freq: Every day | ORAL | Status: DC
  Administered 2017-01-22 – 2017-01-28 (×7): 40 mg via ORAL
  Filled 2017-01-21 (×7): qty 1

## 2017-01-21 MED ORDER — ONDANSETRON HCL 4 MG PO TABS
4.0000 mg | ORAL_TABLET | Freq: Four times a day (QID) | ORAL | Status: DC | PRN
  Administered 2017-01-22: 4 mg via ORAL
  Filled 2017-01-21: qty 1

## 2017-01-21 MED ORDER — LOPERAMIDE HCL 2 MG PO CAPS: 2.0000 mg | ORAL_CAPSULE | ORAL | Status: DC | PRN

## 2017-01-21 MED ORDER — ACETAMINOPHEN 325 MG PO TABS
325.0000 mg | ORAL_TABLET | ORAL | Status: DC | PRN
  Administered 2017-01-23: 325 mg via ORAL
  Administered 2017-01-24 – 2017-01-26 (×4): 650 mg via ORAL
  Administered 2017-01-26: 325 mg via ORAL
  Administered 2017-01-26 – 2017-01-28 (×4): 650 mg via ORAL
  Filled 2017-01-21 (×6): qty 2
  Filled 2017-01-21: qty 1
  Filled 2017-01-21 (×3): qty 2

## 2017-01-21 MED ORDER — ONDANSETRON HCL 4 MG/2ML IJ SOLN
4.0000 mg | Freq: Four times a day (QID) | INTRAMUSCULAR | Status: DC | PRN
Start: 2017-01-21 — End: 2017-01-28

## 2017-01-21 MED ORDER — PROSIGHT PO TABS
1.0000 | ORAL_TABLET | Freq: Every day | ORAL | Status: DC
  Administered 2017-01-21 – 2017-01-27 (×7): 1 via ORAL
  Filled 2017-01-21 (×7): qty 1

## 2017-01-21 MED ORDER — CLOPIDOGREL BISULFATE 75 MG PO TABS
75.0000 mg | ORAL_TABLET | Freq: Every day | ORAL | Status: DC
  Administered 2017-01-22 – 2017-01-28 (×7): 75 mg via ORAL
  Filled 2017-01-21 (×7): qty 1

## 2017-01-21 MED ORDER — DIAZEPAM 5 MG PO TABS
2.5000 mg | ORAL_TABLET | Freq: Every evening | ORAL | Status: DC | PRN
  Administered 2017-01-26: 2.5 mg via ORAL
  Filled 2017-01-21 (×2): qty 1

## 2017-01-21 MED ORDER — HYDROCHLOROTHIAZIDE 10 MG/ML ORAL SUSPENSION
6.2500 mg | Freq: Every day | ORAL | Status: DC
  Administered 2017-01-22 – 2017-01-28 (×7): 6.25 mg via ORAL
  Filled 2017-01-21 (×8): qty 1.25

## 2017-01-21 NOTE — Progress Notes (Signed)
I have CIR bed available to admit pt to inpt rehab today. I met with pt and her son at bedside and they are in agreement. I spoke with Attending MD at bedside, and he states he has ordered an enema to be given to pt prior to her admission. I will make the arrangements to admit pt to day.RN CM made aware. [phone]

## 2017-01-21 NOTE — Progress Notes (Deleted)
Karen Lorie Phenix, MD Physician Signed Physical Medicine and Rehabilitation  Consult Note Date of Service: 01/20/2017 6:07 AM  Related encounter: ED to Hosp-Admission (Current) from 01/13/2017 in [place] copied text Hover for attribution information   Physical Medicine and Rehabilitation Consult  Reason for Consult: Occluded left leg bypass status post redo femoral artery exposure 01/16/2017 as well as history of right BKA 12/23/2016  Referring Physician: Dr. Deitra Mayo  HPI: [name] is a 83 y.o. 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 She was able to complete sliding board transfers with minimal assist and transferred mat to wheelchair using sliding board squat pivot technique. She lives with her spouse and family with assistance as needed. Presented 01/15/2017 with left leg numbness and increasing pain. Duplex study ultrasound showed occlusion of left leg bypass site. Underwent left profunda to anterior tibial bypass with redo femoral artery exposure 01/16/2017 per [name]. [facility] course pain management. Acute on chronic anemia 7.2-8.3 and monitored. Subcutaneous Lovenox for DVT prophylaxis. Physical and occupational therapy evaluations completed with recommendations of physical medicine rehabilitation consult.  Review of Systems  Constitutional: Negative for chills and fever.  Eyes: Negative for blurred vision and double vision.  Respiratory: Positive for shortness of breath. Negative for cough.  Cardiovascular: Negative  for chest pain.  Gastrointestinal: Positive for constipation. Negative for nausea and vomiting.  GERD  Musculoskeletal: Positive for back pain, joint pain and myalgias.  Skin: Negative for rash.  Neurological: Positive for weakness.  Psychiatric/Behavioral:  Anxiety  All other systems reviewed and are negative.        Past Medical History:  Diagnosis Date  . Anemia   . Anginal pain ([place])   . Arthritis    "qwhere" (09/05/2016)  . Atrial fibrillation ([place]) 09/2016  . Chronic lower back pain   . Complication of anesthesia    "takes a long time for it to wear off; I can hallucinate if I take too much" (09/05/2016)  . DVT (deep venous thrombosis) ([place]) 10/2009  . Fall from steps 08/31/2013   Fx. pelvis, Left Hip, Left Elbow  . Fibromyalgia   . GERD (gastroesophageal reflux disease)    09/22/16- "no too much anymore"  . GI bleed 10/24/2016  . High cholesterol   . History of blood transfusion   . History of hiatal hernia   . Hypertension   . Macular degeneration, wet ([place])    "started in right eye; now legally blind in that eye; now started in left eye but pretty much in control" (09/05/2016)  . Osteoporosis   . Peripheral vascular disease ([place])    nonviable tissue Right foot  . PONV (postoperative nausea and vomiting)   . Squamous cell carcinoma of skin of right calf Aug. 2015  . Stroke ([place])    TIA's no residual  . TIA (transient ischemic attack)    "several at once; none in a long time" (09/05/2016)         Past Surgical History:  Procedure Laterality Date  . ABDOMINAL AORTAGRAM N/A 12/26/2014   Procedure: ABDOMINAL Maxcine Ham; Surgeon: Serafina Mitchell,  MD; [facility]; Service: Cardiovascular; Laterality: N/A;  . AMPUTATION Right 12/23/2016   Procedure: AMPUTATION BELOW KNEE; Surgeon: Serafina Mitchell, MD; Location: [place]; Service: Vascular; Laterality: Right;  . AORTOGRAM N/A 09/26/2016   Procedure: AORTOGRAM; Surgeon: Waynetta Sandy, MD; Location: [place]; Service: Vascular; Laterality: N/A;  . CARPAL TUNNEL RELEASE Right   . CATARACT EXTRACTION W/ INTRAOCULAR LENS IMPLANT, BILATERAL Bilateral   . COLONOSCOPY    . DILATION AND CURETTAGE OF UTERUS    . DRESSING CHANGE UNDER ANESTHESIA Right 01/16/2017   Procedure: DRESSING CHANGE RIGHT BELOW KNEE AMPUTATION; Surgeon: Serafina Mitchell, MD; Location: [place]; Service: Vascular; Laterality: Right;  . EYE SURGERY Right    "macular OR"  . FEMORAL ARTERY STENT  12-11-10   Left SFA  . FEMORAL-POPLITEAL BYPASS GRAFT Left 09/24/2016   Procedure: REDO FEMORAL TO POPLITEAL ARTERY BYPASS GRAFT USING 6MM PROPATEN RINGED GORTEX GRAFT; Surgeon: Serafina Mitchell, MD; Location: [place]; Service: Vascular; Laterality: Left;  . FEMORAL-TIBIAL BYPASS GRAFT Left 01/16/2017   Procedure: REDO BYPASS GRAFT FEMORAL-TIBIAL ARTERY WITH GORTEX GRAFT; Surgeon: Serafina Mitchell, MD; Location: [place]; Service: Vascular; Laterality: Left; AND LOWER LEG   . I&D EXTREMITY Right 12/17/2016   Procedure: IRRIGATION AND DEBRIDEMENT RIGHT FOOT; Surgeon: Serafina Mitchell, MD; Location: [place]; Service: Vascular; Laterality: Right;  . INCISION AND DRAINAGE OF WOUND Left 10/25/2009   leg/notes 11/13/2009  . INSERTION OF ILIAC STENT Left 12/26/2014   Procedure: INSERTION OF ILIAC STENT; Surgeon: Serafina Mitchell, MD; Location: [facility] CATH LAB; Service: Cardiovascular; Laterality: Left;  . INSERTION OF ILIAC STENT Left 09/26/2016   Procedure: SUB INTIMAL INSERTION OF SUPERFICIAL FEMORAL ARTERY AND BELOW KNEE BYPASS GRAFT; Surgeon: Waynetta Sandy, MD; Location: [place]; Service: Vascular; Laterality: Left;  . JOINT REPLACEMENT     knee  . JOINT REPLACEMENT Left Oct. 17, 2014   Elbow ( pt fell 08-31-13 )  . ORIF SHOULDER FRACTURE Right    "it was crushed"  . PERIPHERAL VASCULAR CATHETERIZATION N/A 10/30/2015   Procedure: Abdominal Aortogram w/Lower Extremity; Surgeon: Serafina Mitchell, MD; Location: [place] CV LAB; Service: Cardiovascular;  Laterality: N/A;  . PERIPHERAL VASCULAR CATHETERIZATION  10/30/2015   Procedure: Peripheral Vascular Intervention; Surgeon: Serafina Mitchell, MD; Location: [place] [facility] LAB; Service: Cardiovascular;;  . PERIPHERAL VASCULAR CATHETERIZATION N/A 04/01/2016   Procedure: Abdominal Aortogram w/Lower Extremity; Surgeon: Serafina Mitchell, MD; Location: [place] [facility] LAB; Service: Cardiovascular; Laterality: N/A;  . PERIPHERAL VASCULAR CATHETERIZATION Left 04/01/2016   Procedure: Peripheral Vascular Atherectomy; Surgeon: Serafina Mitchell, MD; Location: [place] [facility] LAB; Service: Cardiovascular; Laterality: Left; Superficial femoral artery.  Marland Kitchen PERIPHERAL VASCULAR CATHETERIZATION Right 09/05/2016   "stent"  . PERIPHERAL VASCULAR CATHETERIZATION N/A 09/05/2016   Procedure: Abdominal Aortogram w/Lower Extremity; Surgeon: Serafina Mitchell, MD; Location: [place] CV LAB; Service: Cardiovascular; Laterality: N/A;  . PERIPHERAL VASCULAR CATHETERIZATION Right 09/05/2016   Procedure: Peripheral Vascular Intervention; Surgeon: Serafina Mitchell, MD; Location: [place] CV LAB; Service: Cardiovascular; Laterality: Right; Superficial Femoral  . PERIPHERAL VASCULAR CATHETERIZATION Left 09/09/2016   Procedure: Lower Extremity Angiography; Surgeon: Serafina Mitchell, MD; Location: [place] CV LAB; Service: Cardiovascular; Laterality: Left;  . PR VEIN BYPASS GRAFT,AORTO-FEM-POP  09-13-09   Left Fem-pop  . THROMBECTOMY FEMORAL ARTERY Right 09/26/2016   Procedure: Thromboembolectomy Right Lower Extremity, Right Femoral Artery Endarterectomy with Patch Angioplasty; Right Lower Extremity Angiogram ; Surgeon: Waynetta Sandy, MD; Location: [place]; Service: Vascular; Laterality: Right;  .  TOTAL ELBOW ARTHROPLASTY Left 09/03/2013   Procedure: LEFT TOTAL ELBOW ARTHROPLASTY; Surgeon: Roseanne Kaufman, MD; Location: [place]; Service: Orthopedics; Laterality: Left;  . TOTAL KNEE ARTHROPLASTY Left 06/2006  . TRANSMETATARSAL  AMPUTATION Right 12/11/2016   Procedure: TRANSMETATARSAL AMPUTATION; Surgeon: Serafina Mitchell, MD; Location: [place]; Service: Vascular; Laterality: Right;  . TUBAL LIGATION    . VAGINAL HYSTERECTOMY              Family History   Problem Relation Age of Onset   . Heart disease Father      Heart Disease before age 34   . Hyperlipidemia Father    . Hypertension Father    . Alcohol abuse Father    . Heart disease Brother    . Hyperlipidemia Brother    . Hypertension Brother    . Deep vein thrombosis Brother    . Heart disease Son      Heart Disease before age 76   . Hyperlipidemia Son    . Hypertension Son    . Heart attack Son    . Diabetes Son    . Hypertension Son    . Hyperlipidemia Sister    . Hypertension Sister     Social History: reports that she quit smoking about 70 years ago. Her smoking use included Cigarettes. She has never used smokeless tobacco. She reports that she does not drink alcohol or use drugs.  Allergies:          Allergies   Allergen Reactions   . Motrin [Ibuprofen] Other (See Comments)     ADVERSE REACTION - GI BLEED   . Statins Other (See Comments)     ADVERSE REACTION  MUSCLE PAIN & WEAKNESS   . Morphine And Related Other (See Comments)     HALLUCINATIONS  REACTION IS SIDE EFFECT   . Oxycontin [Oxycodone Hcl] Other (See Comments)     [REACTION IS SIDE EFFECT] > HALLUCINATIONS   . Promethazine Hcl Other (See Comments)     IV Drug only, makes her act crazy   . Sulfa Antibiotics Nausea And Vomiting             Medications Prior to Admission   Medication Sig Dispense Refill   . acetaminophen (TYLENOL) 500 MG tablet Take 1,000 mg by mouth 2 (two) times daily as needed for moderate pain or headache. Patient took this medication for her pain.     . Cholecalciferol (VITAMIN D3) 2000 UNITS TABS Take 2,000 Units by mouth at bedtime.      . clopidogrel (PLAVIX) 75 MG tablet Take 1 tablet (75 mg total) by mouth daily. 30 tablet 1   . Cyanocobalamin  (VITAMIN B-12 PO) Take 1 tablet by mouth daily.     . diazepam (VALIUM) 5 MG tablet Take 0.5 tablets (2.5 mg total) by mouth at bedtime as needed for anxiety. 15 tablet 0   . fentaNYL (DURAGESIC - DOSED MCG/HR) 12 MCG/HR Place 1 patch (12.5 mcg total) onto the skin every 3 (three) days. 15 patch 0   . gabapentin (NEURONTIN) 300 MG capsule Take 1 capsule (300 mg total) by mouth daily at 8 pm. 30 capsule 1   . Hydrocortisone (GERHARDT'S BUTT CREAM) CREA Apply 1 application topically 2 (two) times daily. 1 each 1   . loperamide (IMODIUM) 2 MG capsule Take 2 mg by mouth as needed for diarrhea or loose stools.     . methocarbamol (ROBAXIN) 500 MG tablet Take 1 tablet (500 mg total) by mouth  every 6 (six) hours as needed for muscle spasms. 60 tablet 0   . metoprolol tartrate (LOPRESSOR) 25 MG tablet Take 1 tablet (25 mg total) by mouth at bedtime. 30 tablet 1   . oxyCODONE (OXY IR/ROXICODONE) 5 MG immediate release tablet Take 1 tablet (5 mg total) by mouth every 4 (four) hours as needed for moderate pain, severe pain or breakthrough pain. 30 tablet 0   . pantoprazole (PROTONIX) 40 MG tablet Take 1 tablet (40 mg total) by mouth daily. 30 tablet 1   . potassium chloride (K-DUR,KLOR-CON) 10 MEQ tablet Take 1 tablet (10 mEq total) by mouth daily. 30 tablet 0   . valsartan-hydrochlorothiazide (DIOVAN-HCT) 160-12.5 MG tablet Take 0.5 tablets by mouth daily. 30 tablet 0   . Multiple Vitamins-Minerals (EYE VITAMINS PO) Take 1 tablet by mouth at bedtime.       Home:  Home Living  Family/patient expects to be discharged to:: Private residence  Living Arrangements: Spouse/significant other  Available Help at Discharge: Family, Available 24 hours/day  Type of Home: House  Home Access: [place]: One level  Bathroom Shower/Tub: Gaffer, Other (comment), Door  Bathroom Toilet: Standard  Home Equipment: Environmental consultant - 2 wheels, Bedside commode, Cane - single point, Grab bars - tub/shower,  Wheelchair - manual, Other (comment), Shower seat (BSC x2)  Additional Comments: pt family is planning for 24/7 supervision as needed. Pt sleeps in a recliner since elbow replacement years ago  Lives With: Spouse  Functional History:  Prior Function  Level of Independence: Needs assistance  Gait / Transfers Assistance Needed: Pt has been standing to pivot with assist since BKA, propelling WC 100', does not prefer sliding board  ADL's / Homemaking Assistance Needed: assist with LB ADLs and shower transfer  Functional Status:  Mobility:  Bed Mobility  Overal bed mobility: Needs Assistance  Bed Mobility: Rolling  Rolling: Mod assist  Supine to sit: Mod assist  Sit to supine: Max assist  General bed mobility comments: increased time with assist to elevate trunk, move legs and use of bed to fully scoot to EOB, Max assist with assist for full body translation from sitting to supine. 2 person assist to scoot to [facility]  Transfers  Overall transfer level: Needs assistance  Transfers: Sit to/from Stand  Sit to Stand: Total assist  General transfer comment: max multimodal cues with pt with tendency for posterior lean, limited ability to block and utilize knee for assist due to sensitive skin. Use of bed pad and bed elevation for rise and anterior translation. Able to place weight on LLE momentarily before sitting limited by pain. Max assist to scoot back onto surface fully. Attempted to stand 3x total with one successful attempt  Ambulation/Gait  General Gait Details: unable   ADL:  ADL  Overall ADL's : Needs assistance/impaired  Grooming: Wash/dry face, Brushing hair, Set up, Bed level, Supervision/safety  Lower Body Dressing: Bed level, Maximal assistance  Toilet Transfer Details (indicate cue type and reason): rolled in bed so bedpan could be positioned under her-Mod A for rolling '  Toileting- Clothing Manipulation and Hygiene: Bed level, Maximal assistance  Toileting - Clothing Manipulation  Details (indicate cue type and reason): pt able to wash peri area with washcloth  General ADL Comments: cues for precautions for LUE.  Cognition:  Cognition  Overall Cognitive Status: (unsure of baseline)  Orientation Level: Oriented X4  Cognition  Arousal/Alertness: Awake/alert  Behavior During Therapy: Flat affect  Overall Cognitive Status: (unsure of  baseline)  Area of Impairment: Awareness  Memory: Decreased short-term memory  Awareness: (pt thinking she was going to fall and she was lying in bed)  General Comments: pt does not recall BKA  Blood pressure (!) 141/60, pulse 87, temperature 97.7 F (36.5 C), temperature source Oral, resp. rate 18, height 5\' 2"  (1.575 m), weight 53.3 kg (117 lb 8.1 oz), SpO2 100 %.  Physical Exam  Vitals reviewed.  Constitutional: She is oriented to person, place, and time. She appears well-developed.  Frail  HENT:  Head: Normocephalic and atraumatic.  Eyes: Conjunctivae and EOM are normal.  Neck: Normal range of motion. Neck supple. No thyromegaly present.  Cardiovascular: Normal rate and regular rhythm.  Respiratory: Effort normal and breath sounds normal. No respiratory distress.  GI: Soft. Bowel sounds are normal. She exhibits no distension.  Musculoskeletal: She exhibits edema and tenderness.  Right BKA  Neurological: She is alert and oriented to person, place, and time.  Motor: B/l UE 4/5 RLE: HF 3+/5 LLE: 3/5 (pain inhibition)  Skin:  Right BKA site is dressed, c/d/i.  Left lower extremity bypass site clean and dry appropriately tender Incision on LLE c/d/i.  Psychiatric: She has a normal mood and affect. Her behavior is normal.      Lab Results Last 24 Hours                                                                                             Revision History                      Routing History

## 2017-01-21 NOTE — Discharge Summary (Signed)
[facility] Discharge Summary   Patient ID:  [name] MRN: [id number] DOB/AGE: 81-May-1929 81 y.o.  Admit date: 01/13/2017 Discharge date: 01/21/2017 Date of Surgery: 01/13/2017 - 01/16/2017 Surgeon: Surgeon(s): Serafina Mitchell, MD Angelia Mould, MD  Admission Diagnosis: Fall; BKA; Ankle Injury Ischemic left lower extremity M62.89  Discharge Diagnoses:  Fall; BKA; Ankle Injury Ischemic left lower extremity M62.89  Secondary Diagnoses: Past Medical History:  Diagnosis Date  . Anemia   . Anginal pain ([place])   . Arthritis    "qwhere" (09/05/2016)  . Atrial fibrillation ([place]) 09/2016  . Chronic lower back pain   . Complication of anesthesia    "takes a long time for it to wear off; I can hallucinate if I take too much" (09/05/2016)  . DVT (deep venous thrombosis) ([place]) 10/2009  . Fall from steps 08/31/2013   Fx. pelvis, Left Hip, Left Elbow  . Fibromyalgia   . GERD (gastroesophageal reflux disease)    09/22/16- "no too much anymore"  . GI bleed 10/24/2016  . High cholesterol   . History of blood transfusion   . History of hiatal hernia   . Hypertension   . Macular degeneration, wet ([place])    "started in right eye; now legally blind in that eye; now started in left eye but pretty much in control" (09/05/2016)  . Osteoporosis   . Peripheral vascular disease ([place])    nonviable tissue Right foot  . PONV (postoperative nausea and vomiting)   . Squamous cell carcinoma of skin of right calf Aug. 2015  . Stroke ([place])    TIA's no residual  . TIA (transient ischemic attack)    "several at once; none in a long time" (09/05/2016)    Procedure(s): REDO BYPASS GRAFT FEMORAL-TIBIAL ARTERY WITH GORTEX GRAFT DRESSING CHANGE RIGHT BELOW KNEE AMPUTATION  Discharged Condition: good  HPI: [name] is a 83 y.o. female, who was just discharged from [facility] inpatient rehabilitation today after undergoing a right below the knee amputation. She had  multiple revascularization attempts on the right side but ultimately required a below-the-knee amputation.  She has a long history of rest pain in the left lower extremity. She has undergone multiple interventions on the left.  1. October 2010: She had an above-knee to below-knee popliteal artery bypass with vein in the left leg.  2. 12/26/2014: She had angioplasty and stenting of the left external iliac artery with angioplasty and stenting of the left superficial femoral artery.  3. 10/30/2015: She had a stent in the left superficial femoral artery and also angioplasty of an in stent stenosis in the left popliteal artery.  4. 04/01/2016: She had atherectomy of the left superficial femoral artery and popliteal artery and also drug coated balloon angioplasty of the left superficial femoral artery and popliteal artery.  5. 09/24/2016: The patient had a redo left femoral to above-knee popliteal artery bypass with a 6 mm PTFE graft. She also had left common femoral artery external iliac artery and profunda femoral endarterectomy.  6. 09/26/2016: The redo femoropopliteal bypass graft failed early and she underwent subintimal angioplasty of the left superficial femoral artery and below-knee popliteal artery stenting with multiple viabahn grafts.   7. 09/27/2016: She had right common femoral artery endarterectomy with bovine pericardial patch angioplasty and right lower extremity thromboembolectomy.  She was discharged from the hospital today and was elevating her leg in her recliner and had numbness in the left leg. The pain and numbness extended up to the  mid calf. She presented to the emergency department. X-ray showed no evidence of a fracture to her foot as she had reportedly fallen also.    Hospital Course:  [name] is a 83 y.o. female is S/P Left Procedure(s): REDO BYPASS GRAFT FEMORAL-TIBIAL ARTERY WITH GORTEX GRAFT DRESSING CHANGE RIGHT BELOW KNEE AMPUTATION  POD#  1 Doppler signals regained in the left LE peroneal artery is the main run off artery.POD3 5 voiding independently, pain controll with fentanyl 12.5 and PO percocet 1-2 q4 PRN, incisions healing well with palpable DP pulse on left foot. Plan CIR for rehab sliding board training and mobility training, and   LE exercises.   Significant Diagnostic Studies: CBC Lab Results  Component Value Date   WBC 6.4 01/21/2017   HGB 7.7 (L) 01/21/2017   HCT 23.5 (L) 01/21/2017   MCV 90.7 01/21/2017   PLT 246 01/21/2017    BMET    Component Value Date/Time   NA 132 (L) 01/16/2017 2358   K 4.3 01/16/2017 2358   CL 100 (L) 01/16/2017 2358   CO2 23 01/16/2017 2358   GLUCOSE 155 (H) 01/16/2017 2358   BUN 14 01/16/2017 2358   CREATININE 0.87 01/16/2017 2358   CALCIUM 9.1 01/16/2017 2358   GFRNONAA 59 (L) 01/16/2017 2358   GFRAA >60 01/16/2017 2358   COAG Lab Results  Component Value Date   INR 1.06 01/16/2017   INR 1.01 01/14/2017   INR 1.14 12/23/2016     Disposition:  Discharge to :Rehab Discharge Instructions    Call MD for:  redness, tenderness, or signs of infection (pain, swelling, bleeding, redness, odor or green/yellow discharge around incision site)    Complete by:  As directed    Call MD for:  severe or increased pain, loss or decreased feeling  in affected limb(s)    Complete by:  As directed    Call MD for:  temperature >100.5    Complete by:  As directed    Discharge instructions    Complete by:  As directed    She may shower daily   Increase activity slowly    Complete by:  As directed    Walk with assistance use walker or cane as needed   Recommend left LE exercises, sliding board transfers.   Resume previous diet    Complete by:  As directed      Allergies as of 01/21/2017      Reactions   Motrin [ibuprofen] Other (See Comments)   ADVERSE REACTION - GI BLEED   Statins Other (See Comments)   ADVERSE REACTION MUSCLE PAIN & WEAKNESS   Morphine And Related Other  (See Comments)   HALLUCINATIONS REACTION IS SIDE EFFECT   Oxycontin [oxycodone Hcl] Other (See Comments)   [REACTION IS SIDE EFFECT]  > HALLUCINATIONS   Promethazine Hcl Other (See Comments)   IV  Drug only, makes her act crazy   Sulfa Antibiotics Nausea And Vomiting      Medication List    TAKE these medications   acetaminophen 500 MG tablet Commonly known as:  TYLENOL Take 1,000 mg by mouth 2 (two) times daily as needed for moderate pain or headache. Patient took this medication for her pain.   clopidogrel 75 MG tablet Commonly known as:  PLAVIX Take 1 tablet (75 mg total) by mouth daily.   diazepam 5 MG tablet Commonly known as:  VALIUM Take 0.5 tablets (2.5 mg total) by mouth at bedtime as needed for anxiety.   EYE  VITAMINS PO Take 1 tablet by mouth at bedtime.   fentaNYL 12 MCG/HR Commonly known as:  DURAGESIC - dosed mcg/hr Place 1 patch (12.5 mcg total) onto the skin every 3 (three) days.   gabapentin 300 MG capsule Commonly known as:  NEURONTIN Take 1 capsule (300 mg total) by mouth daily at 8 pm.   Gerhardt's butt cream Crea Apply 1 application topically 2 (two) times daily.   loperamide 2 MG capsule Commonly known as:  IMODIUM Take 2 mg by mouth as needed for diarrhea or loose stools.   methocarbamol 500 MG tablet Commonly known as:  ROBAXIN Take 1 tablet (500 mg total) by mouth every 6 (six) hours as needed for muscle spasms.   metoprolol tartrate 25 MG tablet Commonly known as:  LOPRESSOR Take 1 tablet (25 mg total) by mouth at bedtime.   oxyCODONE 5 MG immediate release tablet Commonly known as:  Oxy IR/ROXICODONE Take 1 tablet (5 mg total) by mouth every 4 (four) hours as needed for moderate pain, severe pain or breakthrough pain.   pantoprazole 40 MG tablet Commonly known as:  PROTONIX Take 1 tablet (40 mg total) by mouth daily.   potassium chloride 10 MEQ tablet Commonly known as:  K-DUR,KLOR-CON Take 1 tablet (10 mEq total) by mouth  daily.   valsartan-hydrochlorothiazide 160-12.5 MG tablet Commonly known as:  DIOVAN-HCT Take 0.5 tablets by mouth daily.   VITAMIN B-12 PO Take 1 tablet by mouth daily.   Vitamin D3 2000 units Tabs Take 2,000 Units by mouth at bedtime.      Verbal and written Discharge instructions given to the patient. Wound care per Discharge AVS   Signed: Laurence Slate [facility] 01/21/2017, 10:29 AM - For VQI Registry use --- Instructions: Press F2 to tab through selections.  Delete question if not applicable.   Post-op:  Wound infection: No  Graft infection: No  Transfusion: No  If yes, 0 units given New Arrhythmia: No Ipsilateral amputation: [ ]  no, [ ]  Minor, [x ] BKA, [ ]  AKA Discharge patency: [ ]  Primary, [ ]  Primary assisted, [ x] Secondary, [ ]  Occluded Patency judged by: [ ]  Dopper only, [ ]  Palpable graft pulse, [x ] Palpable distal pulse, [ ]  ABI inc. > 0.15, [ ]  Duplex  D/C Ambulatory Status: Wheelchair  Complications: MI: [x]  No, [ ]  Troponin only, [ ]  EKG or Clinical CHF: No Resp failure: [ x] none, [ ]  Pneumonia, [ ]  Ventilator Chg in renal function: [x ] none, [ ]  Inc. Cr > 0.5, [ ]  Temp. Dialysis, [ ]  Permanent dialysis Stroke: [x ] None, [ ]  Minor, [ ]  Major Return to OR: No  Reason for return to OR: [ ]  Bleeding, [ ]  Infection, [ ]  Thrombosis, [ ]  Revision  Discharge medications: Statin use:  No  for medical reason   ASA use:  No  for medical reason   Plavix use:  Yes Beta blocker use: Yes Coumadin use: No  for medical reason

## 2017-01-21 NOTE — Progress Notes (Signed)
Pt admitted to unit at 1640 with daughter at bedside. RN reviewed rehab process and safety plan with verbal understanding. Skin assessment complete with stage II to sacrum and deep tissue injury to left heel. Call bell within reach, bed alarm in place. Pt refusing to eat dinner at this time due to lack of appetite.  Pt slide board from home at bedside.

## 2017-01-21 NOTE — Plan of Care (Signed)
Problem: Bowel/Gastric: Goal: Will not experience complications related to bowel motility Outcome: Progressing Enema given with good results

## 2017-01-21 NOTE — Progress Notes (Signed)
Karen Gong, RN Rehab Admission Coordinator Signed Physical Medicine and Rehabilitation  PMR Pre-admission Date of Service: 01/21/2017 12:35 PM  Related encounter: ED to Hosp-Admission (Current) from 01/13/2017 in [place]       [] Hide copied text PMR Admission Coordinator Pre-Admission Assessment  Patient: [name] is an 83 y.o., female MRN: [id number] DOB: [birth year] Height: 5\' 2"  (157.5 cm) Weight: 54.1 kg (119 lb 4.3 oz)                                                                                                                                                  Insurance Information HMO:     PPO:      PCP:      IPA:      80/20: yes     OTHER: no HMO PRIMARY: Medicare a and b      Policy#: [id number]      Subscriber: pt Eff. Date: 09/18/95     Deduct: $1340      Out of Pocket Max: none      Life Max: none CIR: 100%      SNF: 100 days Outpatient: 80%     Co-Pay: 20% Home Health: 100%      Co-Pay: none DME: 80%     Co-Pay: 20% Providers: pt choice  SECONDARY: BCBS supplement      Policy#: [id number]      Subscriber: pt  Medicaid Application Date:       Case Manager:  Disability Application Date:       Case Worker:   Emergency Contact Information        Contact Information    Name Relation Home Work Mobile   [name] Daughter   [phone]   [name] [phone]       Current Medical History  Patient Admitting Diagnosis:  Occluded left leg bypass post redo femoral artery exposure as well as a history of recent right BKA  History of Present Illness:    : HPI: [name] a 83 y.o.right hand femalewith history of right elbow replacement, atrial fibrillation maintained on Plavix, GI bleed, chronic low back pain with fibromyalgia, chronic kidney disease stage III, hypertension, peripheral vascular disease with femoral-popliteal bypass grafting 09/24/2016 as well as 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 She was able to complete sliding board transfers with minimal assist and transferred mat to wheelchair using sliding board squat pivot technique. She lives with her spouse and family with assistance as needed. Presented 01/13/2017 with left leg numbness and increasing pain. Duplex study ultrasound showed occlusion of left leg bypass site. Underwent left profunda to anterior tibial bypass with redo femoral artery exposure 01/16/2017 per [name]. [facility] course pain management. Acute on chronic anemia 7.2-8.3 and  monitored. Subcutaneous Lovenox for DVT prophylaxis.   Past Medical History      Past Medical History:  Diagnosis Date  . Anemia   . Anginal pain ([place])   . Arthritis    "qwhere" (09/05/2016)  . Atrial fibrillation ([place]) 09/2016  . Chronic lower back pain   . Complication of anesthesia    "takes a long time for it to wear off; I can hallucinate if I take too much" (09/05/2016)  . DVT (deep venous thrombosis) ([place]) 10/2009  . Fall from steps 08/31/2013   Fx. pelvis, Left Hip, Left Elbow  . Fibromyalgia   . GERD (gastroesophageal reflux disease)    09/22/16- "no too much anymore"  . GI bleed 10/24/2016  . High cholesterol   . History of blood transfusion   . History of hiatal hernia   . Hypertension   . Macular degeneration, wet ([place])    "started in right eye; now legally blind in that eye; now started in left eye but pretty much in control" (09/05/2016)  . Osteoporosis   . Peripheral vascular disease ([place])    nonviable tissue Right foot  . PONV (postoperative nausea and vomiting)   . Squamous cell carcinoma of skin of right calf Aug. 2015  . Stroke ([place])    TIA's no residual  . TIA (transient ischemic attack)    "several at once; none in a long time" (09/05/2016)    Family History  family history includes Alcohol abuse in her father;  Deep vein thrombosis in her brother; Diabetes in her son; Heart attack in her son; Heart disease in her brother, father, and son; Hyperlipidemia in her brother, father, sister, and son; Hypertension in her brother, father, sister, son, and son.  Prior Rehab/Hospitalizations:  Has the patient had major surgery during 100 days prior to admission? Yes  Current Medications   Current Facility-Administered Medications:  .  0.9 %  sodium chloride infusion, 500 mL, Intravenous, Once PRN, Alvia Grove, PA-C .  acetaminophen (TYLENOL) tablet 1,000 mg, 1,000 mg, Oral, BID PRN, Angelia Mould, MD, 1,000 mg at 01/20/17 1307 .  alum & mag hydroxide-simeth (MAALOX/MYLANTA) 200-200-20 MG/5ML suspension 15-30 mL, 15-30 mL, Oral, Q2H PRN, Angelia Mould, MD .  bisacodyl (DULCOLAX) suppository 10 mg, 10 mg, Rectal, Daily PRN, Serafina Mitchell, MD, 10 mg at 01/21/17 1021 .  cholecalciferol (VITAMIN D) tablet 2,000 Units, 2,000 Units, Oral, QHS, Angelia Mould, MD, 2,000 Units at 01/20/17 2148 .  clopidogrel (PLAVIX) tablet 75 mg, 75 mg, Oral, Daily, Janalyn Harder Trinh, PA-C, 75 mg at 01/21/17 1022 .  diazepam (VALIUM) tablet 2.5 mg, 2.5 mg, Oral, QHS PRN, Angelia Mould, MD, 2.5 mg at 01/15/17 2315 .  docusate sodium (COLACE) capsule 100 mg, 100 mg, Oral, Daily, Kimberly A Trinh, PA-C, 100 mg at 01/21/17 1000 .  enoxaparin (LOVENOX) injection 40 mg, 40 mg, Subcutaneous, Q24H, Kimberly A Trinh, PA-C, 40 mg at 01/21/17 1021 .  feeding supplement (ENSURE ENLIVE) (ENSURE ENLIVE) liquid 237 mL, 237 mL, Oral, BID BM, Angelia Mould, MD .  fentaNYL ([place] - dosed mcg/hr) 12.5 mcg, 12.5 mcg, Transdermal, Q72H, Ulyses Amor, PA-C, 12.5 mcg at 01/20/17 0830 .  gabapentin (NEURONTIN) capsule 300 mg, 300 mg, Oral, Q2000, Angelia Mould, MD, 300 mg at 01/20/17 2148 .  Gerhardt's butt cream 1 application, 1 application, Topical, BID, Angelia Mould, MD, 1 application at  67/67/20 1000 .  guaiFENesin-dextromethorphan (ROBITUSSIN DM) 100-10 MG/5ML syrup 15 mL, 15  mL, Oral, Q4H PRN, Angelia Mould, MD .  hydrochlorothiazide 10 mg/mL oral suspension 6.25 mg, 6.25 mg, Oral, Daily, Angelia Mould, MD, 6.25 mg at 01/21/17 1029 .  HYDROmorphone (DILAUDID) injection 1 mg, 1 mg, Intravenous, Q2H PRN, Angelia Mould, MD, 1 mg at 01/20/17 0047 .  irbesartan (AVAPRO) tablet 75 mg, 75 mg, Oral, Daily, Angelia Mould, MD, 75 mg at 01/21/17 1021 .  labetalol (NORMODYNE,TRANDATE) injection 10 mg, 10 mg, Intravenous, Q10 min PRN, Angelia Mould, MD .  loperamide (IMODIUM) capsule 2 mg, 2 mg, Oral, PRN, Angelia Mould, MD .  magnesium sulfate IVPB 2 g 50 mL, 2 g, Intravenous, Daily PRN, Alvia Grove, PA-C .  methocarbamol (ROBAXIN) tablet 500 mg, 500 mg, Oral, Q6H PRN, Angelia Mould, MD, 500 mg at 01/21/17 1110 .  metoprolol (LOPRESSOR) injection 2-5 mg, 2-5 mg, Intravenous, Q2H PRN, Angelia Mould, MD .  metoprolol tartrate (LOPRESSOR) tablet 25 mg, 25 mg, Oral, QHS, Angelia Mould, MD, 25 mg at 01/20/17 2148 .  milk and molasses enema, 1 enema, Rectal, Once, Serafina Mitchell, MD .  multivitamin (PROSIGHT) tablet 1 tablet, 1 tablet, Oral, QHS, Angelia Mould, MD, 1 tablet at 01/20/17 2148 .  ondansetron (ZOFRAN) injection 4 mg, 4 mg, Intravenous, Q6H PRN, Angelia Mould, MD .  oxyCODONE (Oxy IR/ROXICODONE) immediate release tablet 5 mg, 5 mg, Oral, Q4H PRN, Angelia Mould, MD, 5 mg at 01/21/17 0547 .  pantoprazole (PROTONIX) EC tablet 40 mg, 40 mg, Oral, Daily, Angelia Mould, MD, 40 mg at 01/21/17 1022 .  phenol (CHLORASEPTIC) mouth spray 1 spray, 1 spray, Mouth/Throat, PRN, Angelia Mould, MD .  potassium chloride (K-DUR,KLOR-CON) CR tablet 10 mEq, 10 mEq, Oral, Daily, Angelia Mould, MD, 10 mEq at 01/21/17 1022 .  potassium chloride SA (K-DUR,KLOR-CON) CR tablet 20-40 mEq,  20-40 mEq, Oral, Daily PRN, Alvia Grove, PA-C .  vitamin B-12 (CYANOCOBALAMIN) tablet 100 mcg, 100 mcg, Oral, Daily, Angelia Mould, MD, 100 mcg at 01/21/17 1022  Patients Current Diet: Diet regular Room service appropriate? Yes; Fluid consistency: Thin  Precautions / Restrictions Precautions Precautions: Fall Precaution Comments: pt not to push or pull with Lt elbow more than 5 lbs (s/p total elbow replacement)  Restrictions Weight Bearing Restrictions: Yes RLE Weight Bearing: Non weight bearing   Has the patient had 2 or more falls or a fall with injury in the past year?No  Prior Activity Level Discharged 01/13/17 from CIR and readmitted same day to hospital due to LLE issues. Pt was supervision to min assist wheelchair level at d/c.  Home Assistive Devices / Equipment Home Assistive Devices/Equipment: Bedside commode/3-in-1, Teacher, adult education, Wheelchair (16 X 16 light weight w/c with r amp support pad, Jay 2 cusit) Home Equipment: Environmental consultant - 2 wheels, Bedside commode, Cane - single point, Grab bars - tub/shower, Wheelchair - manual, Other (comment), Shower seat (BSC x2)  Prior Device Use: Indicate devices/aids used by the patient prior to current illness, exacerbation or injury? Manual wheelchair  Prior Functional Level Prior Function Level of Independence: Needs assistance Gait / Transfers Assistance Needed: Pt has been standing to pivot with assist since BKA, propelling WC 100', does not prefer sliding board ADL's / Homemaking Assistance Needed: assist with LB ADLs and shower transfer  Self Care: Did the patient need help bathing, dressing, using the toilet or eating?  Needed some help  Indoor Mobility: Did the patient need assistance with walking from room  to room (with or without device)? Needed some help  Stairs: Did the patient need assistance with internal or external stairs (with or without device)? Needed some help  Functional Cognition: Did the  patient need help planning regular tasks such as shopping or remembering to take medications? Needed some help  Current Functional Level Cognition  Overall Cognitive Status: Impaired/Different from baseline Orientation Level: Oriented X4 Safety/Judgement: Decreased awareness of deficits General Comments: pt does not recall BKA    Extremity Assessment (includes Sensation/Coordination)  Upper Extremity Assessment: Overall WFL for tasks assessed, LUE deficits/detail LUE Deficits / Details: previous elbow replacement  Lower Extremity Assessment: Defer to PT evaluation LLE Deficits / Details: sensitive to touch with limited strength grossly 2/5 today    ADLs  Overall ADL's : Needs assistance/impaired Grooming: Wash/dry face, Brushing hair, Set up, Bed level, Supervision/safety Lower Body Dressing: Maximal assistance, Bed level Toilet Transfer Details (indicate cue type and reason): rolled in bed so bedpan could be positioned under her-Mod A for rolling ' Toileting- Clothing Manipulation and Hygiene: Bed level, Maximal assistance Toileting - Clothing Manipulation Details (indicate cue type and reason): pt able to wash peri area with washcloth Functional mobility during ADLs: +2 for physical assistance, Maximal assistance General ADL Comments: Asked by nursing to help pt back to bed. Pt painful and anxious about moving. Pt had difficulty touching LLE to floor. Knee eventually achieved @ 90 degrees flexion. Pt unable to push/pull with LUE  and limited use R shoulder due to replacement per daughter. Max A to scoot hips forward in chair. Max A +2 to stand pivot pt to bed from lower recliner using gait belt with Pt holding therapist's and tech's arms and use of bedpad to pivot to bed. Max A to recline in bed    Mobility  Overal bed mobility: Needs Assistance Bed Mobility: Sit to Supine Rolling: Mod assist Supine to sit: Min assist Sit to supine: Max assist General bed mobility comments:  Pt unable to problem solve how to recline into bed.    Transfers  Overall transfer level: Needs assistance Equipment used: 2 person hand held assist Transfers: Sit to/from Stand, Stand Pivot Transfers Sit to Stand: Max assist, +2 physical assistance Stand pivot transfers: Max assist, +2 physical assistance General transfer comment: Daughter expressed concerns regarding L ankle supinating during trnasfer. Could not observe due to positioning with pt. Daughter states her mom's "foot would roll over because the foot was numb at the base of the big toe".Wiill furtehr assess.     Ambulation / Gait / Stairs / Wheelchair Mobility  Ambulation/Gait General Gait Details: unable    Posture / Balance Dynamic Sitting Balance Sitting balance - Comments: posterior bias when challenged Balance Overall balance assessment: Needs assistance Sitting balance-Leahy Scale: Fair Sitting balance - Comments: posterior bias when challenged Standing balance-Leahy Scale: Zero    Special needs/care consideration BiPAP/CPAP  N/a CPM n/a Continuous Drip IV  N/a Dialysis  N/a Life Vest n/a Oxygen  N/a Special Bed  N/a Trach Size  N/a Wound Vac (area)  N/a Skin Right BKA surgical site. Buttocks medial area with moisture skin area, arms and legs with ecchymosis, skin tear to left buttocks Bowel mgmt: no BM since admission 01/13/17. suppository given 01/21/17 without results. Acute MD has ordered an enema to be given prior to pt d/c to CIR today Bladder mgmt: continent on bedpan Diabetic mgmt n/a   Previous Home Environment Living Arrangements: Spouse/significant other  Lives With: Spouse Available Help at Discharge: Family,  Available 24 hours/day (son and daughter provide 24/7 supervision) Type of Home: House Home Layout: One level Home Access: Ramped entrance Bathroom Shower/Tub: Walk-in shower, Other (comment), Door Bathroom Toilet: Standard Bathroom Accessibility: Yes How Accessible: Accessible via  wheelchair [place]: Yes Type of Home Care Services: Home RN, [facility], [place] (if known): [facility] for [place] case Additional Comments: pt family is planning for 24/7 supervision as needed. Pt sleeps in a recliner since elbow replacement years ago  Discharge Living Setting Plans for Discharge Living Setting: Patient's home, Lives with (comment) (spouse) Type of Home at Discharge: House Discharge Home Layout: One level Discharge Home Access: [place] entrance Discharge Bathroom Shower/Tub: Walk-in shower Discharge Bathroom Toilet: Standard Discharge Bathroom Accessibility: Yes How Accessible: Accessible via wheelchair Does the patient have any problems obtaining your medications?: No  Social/Family/Support Systems Patient Roles: Spouse, Parent Contact Information: [place] - daughter Anticipated Caregiver: daughter, son, and husband Anticipated Caregiver's Contact Information: see above Ability/Limitations of Caregiver: Husband is retired and can provide supervision but not physical lifting.  Daughter works but can assist when not working. Caregiver Availability: 24/7 Discharge Plan Discussed with Primary Caregiver: Yes Is Caregiver In Agreement with Plan?: Yes Does Caregiver/Family have Issues with Lodging/Transportation while Pt is in Rehab?: No Daughter works as Therapist, sports at surgical center. Daughter and son to provide 24/7 min physical assist at home with pt's spouse who can provide supervision level care.  Goals/Additional Needs Patient/Family Goal for Rehab: Min assist with PT and OT at wheelchair level Expected length of stay: 7- 10 days Cultural Considerations: [employer] Dietary Needs: Regular diet, thin liquids Pt/Family Agrees to Admission and willing to participate: Yes Program Orientation Provided & Reviewed with Pt/Caregiver Including Roles  & Responsibilities: Yes  Decrease burden of Care through IP rehab admission: n/a  Possible need for SNF  placement upon discharge:not anticipated  Patient Condition: This patient's medical and functional status has changed since the consult dated 3/6/2018_ in which the Rehabilitation Physician determined and documented that the patient was potentially appropriate for intensive rehabilitative care in an inpatient rehabilitation facility. Issues have been addressed and update has been discussed with Dr. Posey Pronto and patient now appropriate for inpatient rehabilitation. Will admit to inpatient rehab today.   Preadmission Screen Completed By:  Cleatrice Burke, 01/21/2017 12:47 PM ______________________________________________________________________   Discussed status with Dr. Posey Pronto on 01/21/2017 at  1246 and received telephone approval for admission today.  Admission Coordinator:  Cleatrice Burke, time 3007 Date 01/21/2017.       Cosigned by: Ankit Lorie Phenix, MD at 01/21/2017 1:04 PM  Revision History

## 2017-01-21 NOTE — Progress Notes (Signed)
Initial Nutrition Assessment  DOCUMENTATION CODES:   Severe malnutrition in context of chronic illness  INTERVENTION:  Premiere Protein BID between meals. This supplement provides 30 grams of protein and 160 kcal.  Magic Cup BID with meals. Each supplement provides 290 kcals and 9 grams of protein.   NUTRITION DIAGNOSIS:   Malnutrition related to chronic illness as evidenced by percent weight loss, severe depletion of muscle mass (7 percent).   GOAL:   Patient will meet greater than or equal to 90% of their needs   MONITOR:   PO intake, Supplement acceptance, I & O's, Skin, Labs, Weight trends  REASON FOR ASSESSMENT:   Malnutrition Screening Tool    ASSESSMENT:   Pt is an 83 y.o. 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 PMHx: Anginal pain, Arthritis, ChronicPt is an 83 y.o. 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 PMHx: Anginal pain, Arthritis, Chronic lower back pain, DVT, Fibromyalgia, GERD, High cholesterol, HTN, Macular degeneration, PVD, TIA.  lower back pain, DVT, Fibromyalgia, GERD, High cholesterol, HTN, Macular degeneration, PVD, TIA.   Patient reported 15 lb weight loss since early January. This is a 7% weight loss, which is significant for this time frame. Patient's daughter in room during consult. Daughter stated patients appetite had been significantly decreased since 3 surgeries in January. Per daughter, patient had only consumed gram crackers with peanut butter, and a few bites of vegetable soup (brought in from home).   Patient reported she had not had a bowel movement in over a week. Dulcolax suppository was given around noon and patient still had not had a BM during time of consult. Patient's daughter believes this is contributing to lack of PO intake also.   Patient reported she did not like Boost or  Ensure. Daughter reported she did like Strawberry flavored Premiere Protein. Patient agreed to try this supplement along with Magic Cup during admission.  Nutrition focused physical exam completed. Findings were severe muscle depletion and moderate fat depletion  Labs Reviewed  Meds Reviewed: Vitamin D, Colace, Fentanyl, Hydrochlorothiazide, Avapro, Milk and Molasses enema, MVI, Potassium Chloride, B-12   Diet Order:  Diet regular Room service appropriate? Yes; Fluid consistency: Thin  Skin:    Stage II Pressure Injury Sacrum   Last BM:  2/28  Height:   Ht Readings from Last 1 Encounters:  01/13/17 5\' 2"  (1.575 m)    Weight:   Wt Readings from Last 1 Encounters:  01/21/17 119 lb 4.3 oz (54.1 kg)    Ideal Body Weight:  57.8 kg (Adjusted for R BKA)  BMI:  Body mass index is 21.81 kg/m.  Estimated Nutritional Needs:   Kcal:  1600-1900  Protein:  67-84 grams  Fluid:  >/= 1.5 L/day  EDUCATION NEEDS:   No education needs identified at this time  Juliann Pulse M.S. Nutrition Dietetic Intern

## 2017-01-21 NOTE — PMR Pre-admission (Signed)
PMR Admission Coordinator Pre-Admission Assessment  Patient: [name] is an 83 y.o., female MRN: [id number] DOB: 05/15/1928 Height: 5\' 2"  (157.5 cm) Weight: 54.1 kg (119 lb 4.3 oz)              Insurance Information HMO:     PPO:      PCP:      IPA:      80/20: yes     OTHER: no HMO PRIMARY: Medicare a and b      Policy#: [id number]      Subscriber: pt Eff. Date: 09/18/95     Deduct: $1340      Out of Pocket Max: none      Life Max: none CIR: 100%      SNF: 100 days Outpatient: 80%     Co-Pay: 20% Home Health: 100%      Co-Pay: none DME: 80%     Co-Pay: 20% Providers: pt choice  SECONDARY: BCBS supplement      Policy#: [id number]      Subscriber: pt  Medicaid Application Date:       Case Manager:  Disability Application Date:       Case Worker:   Emergency Contact Information Contact Information    Name Relation Home Work Mobile   Wixted,Cindy Daughter   [phone]   Darria, Corvera [phone]       Current Medical History  Patient Admitting Diagnosis:  Occluded left leg bypass post redo femoral artery exposure as well as a history of recent right BKA  History of Present Illness:    : HPI: [name] a 83 y.o.right hand femalewith history of right elbow replacement, atrial fibrillation maintained on Plavix, GI bleed, chronic low back pain with fibromyalgia, chronic kidney disease stage III, hypertension, peripheral vascular disease with femoral-popliteal bypass grafting 09/24/2016 as well as 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 She was able to complete sliding board transfers with minimal assist and transferred mat to wheelchair using sliding board squat pivot technique. She lives with her spouse and family with assistance as needed. Presented 01/13/2017 with left leg numbness and increasing pain. Duplex study ultrasound showed occlusion of left leg bypass  site. Underwent left profunda to anterior tibial bypass with redo femoral artery exposure 01/16/2017 per [name]. [facility] course pain management. Acute on chronic anemia 7.2-8.3 and monitored. Subcutaneous Lovenox for DVT prophylaxis.   Past Medical History  Past Medical History:  Diagnosis Date  . Anemia   . Anginal pain ([place])   . Arthritis    "qwhere" (09/05/2016)  . Atrial fibrillation ([place]) 09/2016  . Chronic lower back pain   . Complication of anesthesia    "takes a long time for it to wear off; I can hallucinate if I take too much" (09/05/2016)  . DVT (deep venous thrombosis) ([place]) 10/2009  . Fall from steps 08/31/2013   Fx. pelvis, Left Hip, Left Elbow  . Fibromyalgia   . GERD (gastroesophageal reflux disease)    09/22/16- "no too much anymore"  . GI bleed 10/24/2016  . High cholesterol   . History of blood transfusion   . History of hiatal hernia   . Hypertension   . Macular degeneration, wet ([place])    "started in right eye; now legally blind in that eye; now started in left eye but pretty much in control" (09/05/2016)  . Osteoporosis   . Peripheral vascular disease ([place])  nonviable tissue Right foot  . PONV (postoperative nausea and vomiting)   . Squamous cell carcinoma of skin of right calf Aug. 2015  . Stroke ([place])    TIA's no residual  . TIA (transient ischemic attack)    "several at once; none in a long time" (09/05/2016)    Family History  family history includes Alcohol abuse in her father; Deep vein thrombosis in her brother; Diabetes in her son; Heart attack in her son; Heart disease in her brother, father, and son; Hyperlipidemia in her brother, father, sister, and son; Hypertension in her brother, father, sister, son, and son.  Prior Rehab/Hospitalizations:  Has the patient had major surgery during 100 days prior to admission? Yes  Current Medications   Current Facility-Administered Medications:  .  0.9 %  sodium chloride infusion, 500 mL,  Intravenous, Once PRN, Alvia Grove, PA-C .  acetaminophen (TYLENOL) tablet 1,000 mg, 1,000 mg, Oral, BID PRN, Angelia Mould, MD, 1,000 mg at 01/20/17 1307 .  alum & mag hydroxide-simeth (MAALOX/MYLANTA) 200-200-20 MG/5ML suspension 15-30 mL, 15-30 mL, Oral, Q2H PRN, Angelia Mould, MD .  bisacodyl (DULCOLAX) suppository 10 mg, 10 mg, Rectal, Daily PRN, Serafina Mitchell, MD, 10 mg at 01/21/17 1021 .  cholecalciferol (VITAMIN D) tablet 2,000 Units, 2,000 Units, Oral, QHS, Angelia Mould, MD, 2,000 Units at 01/20/17 2148 .  clopidogrel (PLAVIX) tablet 75 mg, 75 mg, Oral, Daily, Janalyn Harder Trinh, PA-C, 75 mg at 01/21/17 1022 .  diazepam (VALIUM) tablet 2.5 mg, 2.5 mg, Oral, QHS PRN, Angelia Mould, MD, 2.5 mg at 01/15/17 2315 .  docusate sodium (COLACE) capsule 100 mg, 100 mg, Oral, Daily, Kimberly A Trinh, PA-C, 100 mg at 01/21/17 1000 .  enoxaparin (LOVENOX) injection 40 mg, 40 mg, Subcutaneous, Q24H, Kimberly A Trinh, PA-C, 40 mg at 01/21/17 1021 .  feeding supplement (ENSURE ENLIVE) (ENSURE ENLIVE) liquid 237 mL, 237 mL, Oral, BID BM, Angelia Mould, MD .  fentaNYL ([place] - dosed mcg/hr) 12.5 mcg, 12.5 mcg, Transdermal, Q72H, Ulyses Amor, PA-C, 12.5 mcg at 01/20/17 0830 .  gabapentin (NEURONTIN) capsule 300 mg, 300 mg, Oral, Q2000, Angelia Mould, MD, 300 mg at 01/20/17 2148 .  Gerhardt's butt cream 1 application, 1 application, Topical, BID, Angelia Mould, MD, 1 application at 74/94/49 1000 .  guaiFENesin-dextromethorphan (ROBITUSSIN DM) 100-10 MG/5ML syrup 15 mL, 15 mL, Oral, Q4H PRN, Angelia Mould, MD .  hydrochlorothiazide 10 mg/mL oral suspension 6.25 mg, 6.25 mg, Oral, Daily, Angelia Mould, MD, 6.25 mg at 01/21/17 1029 .  HYDROmorphone (DILAUDID) injection 1 mg, 1 mg, Intravenous, Q2H PRN, Angelia Mould, MD, 1 mg at 01/20/17 0047 .  irbesartan (AVAPRO) tablet 75 mg, 75 mg, Oral, Daily, Angelia Mould,  MD, 75 mg at 01/21/17 1021 .  labetalol (NORMODYNE,TRANDATE) injection 10 mg, 10 mg, Intravenous, Q10 min PRN, Angelia Mould, MD .  loperamide (IMODIUM) capsule 2 mg, 2 mg, Oral, PRN, Angelia Mould, MD .  magnesium sulfate IVPB 2 g 50 mL, 2 g, Intravenous, Daily PRN, Alvia Grove, PA-C .  methocarbamol (ROBAXIN) tablet 500 mg, 500 mg, Oral, Q6H PRN, Angelia Mould, MD, 500 mg at 01/21/17 1110 .  metoprolol (LOPRESSOR) injection 2-5 mg, 2-5 mg, Intravenous, Q2H PRN, Angelia Mould, MD .  metoprolol tartrate (LOPRESSOR) tablet 25 mg, 25 mg, Oral, QHS, Angelia Mould, MD, 25 mg at 01/20/17 2148 .  milk and molasses enema, 1 enema, Rectal, Once, Durene Fruits  Pierre Bali, MD .  multivitamin (PROSIGHT) tablet 1 tablet, 1 tablet, Oral, QHS, Angelia Mould, MD, 1 tablet at 01/20/17 2148 .  ondansetron (ZOFRAN) injection 4 mg, 4 mg, Intravenous, Q6H PRN, Angelia Mould, MD .  oxyCODONE (Oxy IR/ROXICODONE) immediate release tablet 5 mg, 5 mg, Oral, Q4H PRN, Angelia Mould, MD, 5 mg at 01/21/17 0547 .  pantoprazole (PROTONIX) EC tablet 40 mg, 40 mg, Oral, Daily, Angelia Mould, MD, 40 mg at 01/21/17 1022 .  phenol (CHLORASEPTIC) mouth spray 1 spray, 1 spray, Mouth/Throat, PRN, Angelia Mould, MD .  potassium chloride (K-DUR,KLOR-CON) CR tablet 10 mEq, 10 mEq, Oral, Daily, Angelia Mould, MD, 10 mEq at 01/21/17 1022 .  potassium chloride SA (K-DUR,KLOR-CON) CR tablet 20-40 mEq, 20-40 mEq, Oral, Daily PRN, Alvia Grove, PA-C .  vitamin B-12 (CYANOCOBALAMIN) tablet 100 mcg, 100 mcg, Oral, Daily, Angelia Mould, MD, 100 mcg at 01/21/17 1022  Patients Current Diet: Diet regular Room service appropriate? Yes; Fluid consistency: Thin  Precautions / Restrictions Precautions Precautions: Fall Precaution Comments: pt not to push or pull with Lt elbow more than 5 lbs (s/p total elbow replacement)  Restrictions Weight Bearing  Restrictions: Yes RLE Weight Bearing: Non weight bearing   Has the patient had 2 or more falls or a fall with injury in the past year?No  Prior Activity Level Discharged 01/13/17 from CIR and readmitted same day to hospital due to LLE issues. Pt was supervision to min assist wheelchair level at d/c.  Home Assistive Devices / Equipment Home Assistive Devices/Equipment: Bedside commode/3-in-1, Teacher, adult education, Wheelchair (16 X 16 light weight w/c with r amp support pad, Jay 2 cusit) Home Equipment: Environmental consultant - 2 wheels, Bedside commode, Cane - single point, Grab bars - tub/shower, Wheelchair - manual, Other (comment), Shower seat (BSC x2)  Prior Device Use: Indicate devices/aids used by the patient prior to current illness, exacerbation or injury? Manual wheelchair  Prior Functional Level Prior Function Level of [place]: Needs assistance Gait / Transfers Assistance Needed: Pt has been standing to pivot with assist since BKA, propelling WC 100', does not prefer sliding board ADL's / Homemaking Assistance Needed: assist with LB ADLs and shower transfer  Self Care: Did the patient need help bathing, dressing, using the toilet or eating?  Needed some help  Indoor Mobility: Did the patient need assistance with walking from room to room (with or without device)? Needed some help  Stairs: Did the patient need assistance with internal or external stairs (with or without device)? Needed some help  Functional Cognition: Did the patient need help planning regular tasks such as shopping or remembering to take medications? Needed some help  Current Functional Level Cognition  Overall Cognitive Status: Impaired/Different from baseline Orientation Level: Oriented X4 Safety/Judgement: Decreased awareness of deficits General Comments: pt does not recall BKA    Extremity Assessment (includes Sensation/Coordination)  Upper Extremity Assessment: Overall WFL for tasks assessed, LUE  deficits/detail LUE Deficits / Details: previous elbow replacement  Lower Extremity Assessment: Defer to PT evaluation LLE Deficits / Details: sensitive to touch with limited strength grossly 2/5 today    ADLs  Overall ADL's : Needs assistance/impaired Grooming: Wash/dry face, Brushing hair, Set up, Bed level, Supervision/safety Lower Body Dressing: Maximal assistance, Bed level Toilet Transfer Details (indicate cue type and reason): rolled in bed so bedpan could be positioned under her-Mod A for rolling ' Toileting- Clothing Manipulation and Hygiene: Bed level, Maximal assistance Toileting - Clothing Manipulation Details (indicate cue  type and reason): pt able to wash peri area with washcloth Functional mobility during ADLs: +2 for physical assistance, Maximal assistance General ADL Comments: Asked by nursing to help pt back to bed. Pt painful and anxious about moving. Pt had difficulty touching LLE to floor. Knee eventually achieved @ 90 degrees flexion. Pt unable to push/pull with LUE  and limited use R shoulder due to replacement per daughter. Max A to scoot hips forward in chair. Max A +2 to stand pivot pt to bed from lower recliner using gait belt with Pt holding therapist's and tech's arms and use of bedpad to pivot to bed. Max A to recline in bed    Mobility  Overal bed mobility: Needs Assistance Bed Mobility: Sit to Supine Rolling: Mod assist Supine to sit: Min assist Sit to supine: Max assist General bed mobility comments: Pt unable to problem solve how to recline into bed.    Transfers  Overall transfer level: Needs assistance Equipment used: 2 person hand held assist Transfers: Sit to/from Stand, Stand Pivot Transfers Sit to Stand: Max assist, +2 physical assistance Stand pivot transfers: Max assist, +2 physical assistance General transfer comment: Daughter expressed concerns regarding L ankle supinating during trnasfer. Could not observe due to positioning with pt. Daughter  states her mom's "foot would roll over because the foot was numb at the base of the big toe".Wiill furtehr assess.     Ambulation / Gait / Stairs / Wheelchair Mobility  Ambulation/Gait General Gait Details: unable    Posture / Balance Dynamic Sitting Balance Sitting balance - Comments: posterior bias when challenged Balance Overall balance assessment: Needs assistance Sitting balance-Leahy Scale: Fair Sitting balance - Comments: posterior bias when challenged Standing balance-Leahy Scale: Zero    Special needs/care consideration BiPAP/CPAP  N/a CPM n/a Continuous Drip IV  N/a Dialysis  N/a Life Vest n/a Oxygen  N/a Special Bed  N/a Trach Size  N/a Wound Vac (area)  N/a Skin Right BKA surgical site. Buttocks medial area with moisture skin area, arms and legs with ecchymosis, skin tear to left buttocks Bowel mgmt: no BM since admission 01/13/17. suppository given 01/21/17 without results. Acute MD has ordered an enema to be given prior to pt d/c to CIR today Bladder mgmt: continent on bedpan Diabetic mgmt n/a   Previous Home Environment Living Arrangements: Spouse/significant other  Lives With: Spouse Available Help at Discharge: Family, Available 24 hours/day (son and daughter provide 24/7 supervision) Type of Home: House Home Layout: One level Home Access: Ramped entrance Bathroom Shower/Tub: Walk-in shower, Other (comment), Door Bathroom Toilet: Standard Bathroom Accessibility: Yes How Accessible: Accessible via wheelchair [place]: Yes Type of [place]: Home RN, Home PT, JAARS (if known): [place] for [place] case Additional Comments: pt family is planning for 24/7 supervision as needed. Pt sleeps in a recliner since elbow replacement years ago  Discharge Living Setting Plans for Discharge Living Setting: Patient's home, Lives with (comment) (spouse) Type of Home at Discharge: House Discharge Home Layout: One level Discharge Home Access:  [place] entrance Discharge Bathroom Shower/Tub: Walk-in shower Discharge Bathroom Toilet: Standard Discharge Bathroom Accessibility: Yes How Accessible: Accessible via wheelchair Does the patient have any problems obtaining your medications?: No  Social/Family/Support Systems Patient Roles: Spouse, Parent Contact Information: Dashley Monts - daughter Anticipated Caregiver: daughter, son, and husband Anticipated Caregiver's Contact Information: see above Ability/Limitations of Caregiver: Husband is retired and can provide supervision but not physical lifting.  Daughter works but can assist when  not working. Caregiver Availability: 24/7 Discharge Plan Discussed with Primary Caregiver: Yes Is Caregiver In Agreement with Plan?: Yes Does Caregiver/Family have Issues with Lodging/Transportation while Pt is in Rehab?: No Daughter works as Therapist, sports at surgical center. Daughter and son to provide 24/7 min physical assist at home with pt's spouse who can provide supervision level care.  Goals/Additional Needs Patient/Family Goal for Rehab: Min assist with PT and OT at wheelchair level Expected length of stay: 7- 10 days Cultural Considerations: [employer] Dietary Needs: Regular diet, thin liquids Pt/Family Agrees to Admission and willing to participate: Yes Program Orientation Provided & Reviewed with Pt/Caregiver Including Roles  & Responsibilities: Yes  Decrease burden of Care through IP rehab admission: n/a  Possible need for SNF placement upon discharge:not anticipated  Patient Condition: This patient's medical and functional status has changed since the consult dated 3/6/2018_ in which the Rehabilitation Physician determined and documented that the patient was potentially appropriate for intensive rehabilitative care in an inpatient rehabilitation facility. Issues have been addressed and update has been discussed with Dr. Posey Pronto and patient now appropriate for inpatient rehabilitation. Will admit to  inpatient rehab today.   Preadmission Screen Completed By:  Cleatrice Burke, 01/21/2017 12:47 PM ______________________________________________________________________   Discussed status with Dr. Posey Pronto on 01/21/2017 at  1246 and received telephone approval for admission today.  Admission Coordinator:  Cleatrice Burke, time 8343 Date 01/21/2017.

## 2017-01-21 NOTE — Progress Notes (Signed)
PT Cancellation Note  Patient Details Name: [name] MRN: [id number] DOB: February 01, 1928   Cancelled Treatment:    Reason Eval/Treat Not Completed: Other (comment) (Pt refused over her constipation with abd pain).  Son in visiting pt and talked with her about bed exercise, which he encouraged.  Pt refused again, and per her request asked nursing if she had anything else to use, but nursing states no has had recent suppository that has not worked yet.  Will try later as time allows.   Ramond Dial 01/21/2017, 11:43 AM   Mee Hives, PT MS Acute Rehab Dept. Number: [place] and [place]

## 2017-01-21 NOTE — Progress Notes (Signed)
[facility]  Subjective  - Doing well.  Ready to go to CIR   Objective (!) 170/57 94 98.6 F (37 C) (Oral) 18 97% No intake or output data in the 24 hours ending 01/21/17 0813  Palpable DP left foot Incisions healing well Abdomin soft, NTTP, no BM since surgery Heart RRR Lungs non labored breathing.  Assessment/Planning: POD # 5 re-do left LE by pass #1: Left profunda to anterior tibial bypass with PTFE #2: Redo femoral artery exposure  Fenatyl 12.5 every 3 days, PO percocet Pending CIR stable for D/C Pending BM will get dulcolax today  Laurence Slate [facility] 01/21/2017 8:13 AM --  Laboratory Lab Results:  Recent Labs  01/20/17 0210 01/21/17 0213  WBC 5.4 6.4  HGB 7.2* 7.7*  HCT 22.7* 23.5*  PLT 211 246   BMET No results for input(s): NA, K, CL, CO2, GLUCOSE, BUN, CREATININE, CALCIUM in the last 72 hours.  COAG Lab Results  Component Value Date   INR 1.06 01/16/2017   INR 1.01 01/14/2017   INR 1.14 12/23/2016   No results found for: PTT

## 2017-01-21 NOTE — H&P (Signed)
Physical Medicine and Rehabilitation Admission H&P    Chief Complaint  Patient presents with  . Fall  : HPI: [name] is a 83 y.o. 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 History taken from chart review and daughter. She was able to complete sliding board transfers with minimal assist and transferred mat to wheelchair using sliding board squat pivot technique. She lives with her spouse and family with assistance as needed. Presented back 01/13/2017 with left leg numbness and increasing pain. Duplex study ultrasound showed occlusion of left leg bypass site. Underwent left profunda to anterior tibial bypass with redo femoral artery exposure 01/16/2017 per [name]. [facility] course pain management and severe constipation. Acute on chronic anemia 7.2-8.3 and monitored. Subcutaneous Lovenox for DVT prophylaxis. Physical and occupational therapy evaluations completed with recommendations of physical medicine rehabilitation consult.Patient was admitted for a comprehensive rehabilitation program.  Review of Systems  Constitutional: Negative for chills and fever.  HENT: Negative for hearing loss.   Eyes: Negative for blurred vision and double vision.  Respiratory: Positive for shortness of breath. Negative for cough.   Cardiovascular: Positive for leg swelling. Negative for chest pain and palpitations.  Gastrointestinal: Positive for constipation and nausea. Negative for vomiting.  Genitourinary: Negative for dysuria and hematuria.  Musculoskeletal: Positive for back pain, joint pain and myalgias.  Skin: Negative for  rash.  Neurological: Negative for seizures and loss of consciousness.  Psychiatric/Behavioral:       Anxiety  All other systems reviewed and are negative.  Past Medical History:  Diagnosis Date  . Anemia   . Anginal pain ([place])   . Arthritis    "qwhere" (09/05/2016)  . Atrial fibrillation ([place]) 09/2016  . Chronic lower back pain   . Complication of anesthesia    "takes a long time for it to wear off; I can hallucinate if I take too much" (09/05/2016)  . DVT (deep venous thrombosis) ([place]) 10/2009  . Fall from steps 08/31/2013   Fx. pelvis, Left Hip, Left Elbow  . Fibromyalgia   . GERD (gastroesophageal reflux disease)    09/22/16- "no too much anymore"  . GI bleed 10/24/2016  . High cholesterol   . History of blood transfusion   . History of hiatal hernia   . Hypertension   . Macular degeneration, wet ([place])    "started in right eye; now legally blind in that eye; now started in left eye but pretty much in control" (09/05/2016)  . Osteoporosis   . Peripheral vascular disease ([place])    nonviable tissue Right foot  . PONV (postoperative nausea and vomiting)   . Squamous cell carcinoma of skin of right calf Aug. 2015  . Stroke ([place])    TIA's no residual  . TIA (transient ischemic attack)    "several at once; none in a long time" (09/05/2016)   Past Surgical History:  Procedure Laterality Date  . ABDOMINAL AORTAGRAM N/A 12/26/2014   Procedure: ABDOMINAL Maxcine Ham;  Surgeon: Serafina Mitchell, MD;  Location: [facility] CATH LAB;  Service: Cardiovascular;  Laterality: N/A;  . AMPUTATION Right 12/23/2016   Procedure: AMPUTATION BELOW KNEE;  Surgeon: Serafina Mitchell, MD;  Location: [place];  Service: Vascular;  Laterality:  Right;  Marland Kitchen AORTOGRAM N/A 09/26/2016   Procedure: AORTOGRAM;  Surgeon: Waynetta Sandy, MD;  Location: [place];  Service: Vascular;  Laterality: N/A;  . CARPAL TUNNEL RELEASE Right   . CATARACT EXTRACTION W/ INTRAOCULAR LENS  IMPLANT, BILATERAL Bilateral   . COLONOSCOPY      . DILATION AND CURETTAGE OF UTERUS    . DRESSING CHANGE UNDER ANESTHESIA Right 01/16/2017   Procedure: DRESSING CHANGE RIGHT BELOW KNEE AMPUTATION;  Surgeon: Serafina Mitchell, MD;  Location: [place];  Service: Vascular;  Laterality: Right;  . EYE SURGERY Right    "macular OR"  . FEMORAL ARTERY STENT  12-11-10   Left SFA  . FEMORAL-POPLITEAL BYPASS GRAFT Left 09/24/2016   Procedure: REDO FEMORAL TO POPLITEAL ARTERY BYPASS GRAFT USING 6MM PROPATEN RINGED GORTEX GRAFT;  Surgeon: Serafina Mitchell, MD;  Location: [place];  Service: Vascular;  Laterality: Left;  . FEMORAL-TIBIAL BYPASS GRAFT Left 01/16/2017   Procedure: REDO BYPASS GRAFT FEMORAL-TIBIAL ARTERY WITH GORTEX GRAFT;  Surgeon: Serafina Mitchell, MD;  Location: [place];  Service: Vascular;  Laterality: Left;  AND LOWER LEG   . I&D EXTREMITY Right 12/17/2016   Procedure: IRRIGATION AND DEBRIDEMENT RIGHT FOOT;  Surgeon: Serafina Mitchell, MD;  Location: [place];  Service: Vascular;  Laterality: Right;  . INCISION AND DRAINAGE OF WOUND Left 10/25/2009   leg/notes 11/13/2009  . INSERTION OF ILIAC STENT Left 12/26/2014   Procedure: INSERTION OF ILIAC STENT;  Surgeon: Serafina Mitchell, MD;  Location: [facility] CATH LAB;  Service: Cardiovascular;  Laterality: Left;  . INSERTION OF ILIAC STENT Left 09/26/2016   Procedure: SUB INTIMAL INSERTION OF SUPERFICIAL FEMORAL ARTERY AND BELOW KNEE BYPASS GRAFT;  Surgeon: Waynetta Sandy, MD;  Location: [place];  Service: Vascular;  Laterality: Left;  . JOINT REPLACEMENT     knee  . JOINT REPLACEMENT Left Oct. 17, 2014   Elbow ( pt fell 08-31-13 )  . ORIF SHOULDER FRACTURE Right    "it was crushed"  . PERIPHERAL VASCULAR CATHETERIZATION N/A 10/30/2015   Procedure: Abdominal Aortogram w/Lower Extremity;  Surgeon: Serafina Mitchell, MD;  Location: [place] CV LAB;  Service: Cardiovascular;  Laterality: N/A;  . PERIPHERAL VASCULAR CATHETERIZATION  10/30/2015   Procedure: Peripheral Vascular Intervention;  Surgeon: Serafina Mitchell, MD;  Location: [place] CV LAB;  Service: Cardiovascular;;  . PERIPHERAL VASCULAR CATHETERIZATION N/A 04/01/2016   Procedure: Abdominal Aortogram w/Lower Extremity;  Surgeon: Serafina Mitchell, MD;  Location: [place] CV LAB;  Service: Cardiovascular;  Laterality: N/A;  . PERIPHERAL VASCULAR CATHETERIZATION Left 04/01/2016   Procedure: Peripheral Vascular Atherectomy;  Surgeon: Serafina Mitchell, MD;  Location: [place] CV LAB;  Service: Cardiovascular;  Laterality: Left;  Superficial femoral artery.  Marland Kitchen PERIPHERAL VASCULAR CATHETERIZATION Right 09/05/2016   "stent"  . PERIPHERAL VASCULAR CATHETERIZATION N/A 09/05/2016   Procedure: Abdominal Aortogram w/Lower Extremity;  Surgeon: Serafina Mitchell, MD;  Location: [place] CV LAB;  Service: Cardiovascular;  Laterality: N/A;  . PERIPHERAL VASCULAR CATHETERIZATION Right 09/05/2016   Procedure: Peripheral Vascular Intervention;  Surgeon: Serafina Mitchell, MD;  Location: [place] CV LAB;  Service: Cardiovascular;  Laterality: Right;  Superficial Femoral  . PERIPHERAL VASCULAR CATHETERIZATION Left 09/09/2016   Procedure: Lower Extremity Angiography;  Surgeon: Serafina Mitchell, MD;  Location: [place] CV LAB;  Service: Cardiovascular;  Laterality: Left;  . PR VEIN BYPASS GRAFT,AORTO-FEM-POP  09-13-09   Left Fem-pop  . THROMBECTOMY FEMORAL ARTERY Right 09/26/2016  Procedure: Thromboembolectomy Right Lower Extremity, Right Femoral Artery Endarterectomy with Patch Angioplasty; Right Lower Extremity Angiogram ;  Surgeon: Waynetta Sandy, MD;  Location: [place];  Service: Vascular;  Laterality: Right;  . TOTAL ELBOW ARTHROPLASTY Left 09/03/2013   Procedure: LEFT TOTAL ELBOW ARTHROPLASTY;  Surgeon: Roseanne Kaufman, MD;  Location: [place];  Service: Orthopedics;  Laterality: Left;  . TOTAL KNEE ARTHROPLASTY Left 06/2006  . TRANSMETATARSAL AMPUTATION Right 12/11/2016   Procedure: TRANSMETATARSAL AMPUTATION;  Surgeon: Serafina Mitchell, MD;   Location: [place];  Service: Vascular;  Laterality: Right;  . TUBAL LIGATION    . VAGINAL HYSTERECTOMY     Family History  Problem Relation Age of Onset  . Heart disease Father     Heart Disease before age 71  . Hyperlipidemia Father   . Hypertension Father   . Alcohol abuse Father   . Heart disease Brother   . Hyperlipidemia Brother   . Hypertension Brother   . Deep vein thrombosis Brother   . Heart disease Son     Heart Disease before age 88  . Hyperlipidemia Son   . Hypertension Son   . Heart attack Son   . Diabetes Son   . Hypertension Son   . Hyperlipidemia Sister   . Hypertension Sister    Social History:  reports that she quit smoking about 70 years ago. Her smoking use included Cigarettes. She has never used smokeless tobacco. She reports that she does not drink alcohol or use drugs. Allergies:  Allergies  Allergen Reactions  . Motrin [Ibuprofen] Other (See Comments)    ADVERSE REACTION - GI BLEED  . Statins Other (See Comments)    ADVERSE REACTION MUSCLE PAIN & WEAKNESS  . Morphine And Related Other (See Comments)    HALLUCINATIONS REACTION IS SIDE EFFECT  . Oxycontin [Oxycodone Hcl] Other (See Comments)    [REACTION IS SIDE EFFECT]  > HALLUCINATIONS  . Promethazine Hcl Other (See Comments)    IV  Drug only, makes her act crazy  . Sulfa Antibiotics Nausea And Vomiting   Medications Prior to Admission  Medication Sig Dispense Refill  . acetaminophen (TYLENOL) 500 MG tablet Take 1,000 mg by mouth 2 (two) times daily as needed for moderate pain or headache. Patient took this medication for her pain.    . Cholecalciferol (VITAMIN D3) 2000 UNITS TABS Take 2,000 Units by mouth at bedtime.     . clopidogrel (PLAVIX) 75 MG tablet Take 1 tablet (75 mg total) by mouth daily. 30 tablet 1  . Cyanocobalamin (VITAMIN B-12 PO) Take 1 tablet by mouth daily.    . diazepam (VALIUM) 5 MG tablet Take 0.5 tablets (2.5 mg total) by mouth at bedtime as needed for anxiety. 15 tablet 0   . fentaNYL (DURAGESIC - DOSED MCG/HR) 12 MCG/HR Place 1 patch (12.5 mcg total) onto the skin every 3 (three) days. 15 patch 0  . gabapentin (NEURONTIN) 300 MG capsule Take 1 capsule (300 mg total) by mouth daily at 8 pm. 30 capsule 1  . Hydrocortisone (GERHARDT'S BUTT CREAM) CREA Apply 1 application topically 2 (two) times daily. 1 each 1  . loperamide (IMODIUM) 2 MG capsule Take 2 mg by mouth as needed for diarrhea or loose stools.    . methocarbamol (ROBAXIN) 500 MG tablet Take 1 tablet (500 mg total) by mouth every 6 (six) hours as needed for muscle spasms. 60 tablet 0  . metoprolol tartrate (LOPRESSOR) 25 MG tablet Take 1 tablet (25 mg total) by  mouth at bedtime. 30 tablet 1  . oxyCODONE (OXY IR/ROXICODONE) 5 MG immediate release tablet Take 1 tablet (5 mg total) by mouth every 4 (four) hours as needed for moderate pain, severe pain or breakthrough pain. 30 tablet 0  . pantoprazole (PROTONIX) 40 MG tablet Take 1 tablet (40 mg total) by mouth daily. 30 tablet 1  . potassium chloride (K-DUR,KLOR-CON) 10 MEQ tablet Take 1 tablet (10 mEq total) by mouth daily. 30 tablet 0  . valsartan-hydrochlorothiazide (DIOVAN-HCT) 160-12.5 MG tablet Take 0.5 tablets by mouth daily. 30 tablet 0  . Multiple Vitamins-Minerals (EYE VITAMINS PO) Take 1 tablet by mouth at bedtime.       Home: Home Living Family/patient expects to be discharged to:: Private residence Living Arrangements: Spouse/significant other Available Help at Discharge: Family, Available 24 hours/day Type of Home: House Home Access: [place]: One level Bathroom Shower/Tub: Gaffer, Other (comment), Door Bathroom Toilet: Standard Home Equipment: Environmental consultant - 2 wheels, Bedside commode, Cane - single point, Grab bars - tub/shower, Wheelchair - manual, Other (comment), Shower seat (BSC x2) Additional Comments: pt family is planning for 24/7 supervision as needed. Pt sleeps in a recliner since elbow replacement years ago   Lives With: Spouse   Functional History: Prior Function Level of Independence: Needs assistance Gait / Transfers Assistance Needed: Pt has been standing to pivot with assist since BKA, propelling WC 100', does not prefer sliding board ADL's / Homemaking Assistance Needed: assist with LB ADLs and shower transfer  Functional Status:  Mobility: Bed Mobility Overal bed mobility: Needs Assistance Bed Mobility: Sit to Supine Rolling: Mod assist Supine to sit: Min assist Sit to supine: Max assist General bed mobility comments: Pt unable to problem solve how to recline into bed. Transfers Overall transfer level: Needs assistance Equipment used: 2 person hand held assist Transfers: Sit to/from Stand, Stand Pivot Transfers Sit to Stand: Max assist, +2 physical assistance Stand pivot transfers: Max assist, +2 physical assistance General transfer comment: Daughter expressed concerns regarding L ankle supinating during trnasfer. Could not observe due to positioning with pt. Daughter states her mom's "foot would roll over because the foot was numb at the base of the big toe".Wiill furtehr assess.  Ambulation/Gait General Gait Details: unable    ADL: ADL Overall ADL's : Needs assistance/impaired Grooming: Wash/dry face, Brushing hair, Set up, Bed level, Supervision/safety Lower Body Dressing: Maximal assistance, Bed level Toilet Transfer Details (indicate cue type and reason): rolled in bed so bedpan could be positioned under her-Mod A for rolling ' Toileting- Clothing Manipulation and Hygiene: Bed level, Maximal assistance Toileting - Clothing Manipulation Details (indicate cue type and reason): pt able to wash peri area with washcloth Functional mobility during ADLs: +2 for physical assistance, Maximal assistance General ADL Comments: Asked by nursing to help pt back to bed. Pt painful and anxious about moving. Pt had difficulty touching LLE to floor. Knee eventually achieved @ 90 degrees  flexion. Pt unable to push/pull with LUE  and limited use R shoulder due to replacement per daughter. Max A to scoot hips forward in chair. Max A +2 to stand pivot pt to bed from lower recliner using gait belt with Pt holding therapist's and tech's arms and use of bedpad to pivot to bed. Max A to recline in bed  Cognition: Cognition Overall Cognitive Status: Impaired/Different from baseline Orientation Level: Oriented X4 Cognition Arousal/Alertness: Awake/alert Behavior During Therapy: Flat affect, Anxious Overall Cognitive Status: Impaired/Different from baseline Area of Impairment: Memory, Safety/judgement Memory:  Decreased short-term memory Safety/Judgement: Decreased awareness of deficits Awareness: Emergent General Comments: pt does not recall BKA  Physical Exam: Blood pressure (!) 170/57, pulse 94, temperature 98.6 F (37 C), temperature source Oral, resp. rate 18, height 5\' 2"  (1.575 m), weight 54.1 kg (119 lb 4.3 oz), SpO2 97 %. Physical Exam  Vitals reviewed. Constitutional: She is oriented to person, place, and time. She appears well-developed.  Frail  HENT:  Head: Normocephalic and atraumatic.  Eyes: Conjunctivae and EOM are normal. Left eye exhibits no discharge.  Neck: Normal range of motion. Neck supple. No thyromegaly present.  Cardiovascular: Normal rate and regular rhythm.   Respiratory: Effort normal and breath sounds normal. No respiratory distress. She has no wheezes.  GI: Soft. Bowel sounds are normal. She exhibits no distension. There is no tenderness.  Musculoskeletal: She exhibits edema and tenderness.  Neurological: She is alert and oriented to person, place, and time.  Motor: B/l UE 4/5 RLE: HF 4+/5 LLE: 4/5 (pain inhibition)  Skin: Skin is warm and dry.  Skin. Right BKA site is dressed clean and dry without odor.  Left lower extremity bypass site clean and dry appropriately tender .  Psychiatric: She has a normal mood and affect. Her behavior is  normal.    Results for orders placed or performed during the hospital encounter of 01/13/17 (from the past 48 hour(s))  CBC     Status: Abnormal   Collection Time: 01/20/17  2:10 AM  Result Value Ref Range   WBC 5.4 4.0 - 10.5 K/uL   RBC 2.47 (L) 3.87 - 5.11 MIL/uL   Hemoglobin 7.2 (L) 12.0 - 15.0 g/dL   HCT 22.7 (L) 36.0 - 46.0 %   MCV 91.9 78.0 - 100.0 fL   MCH 29.1 26.0 - 34.0 pg   MCHC 31.7 30.0 - 36.0 g/dL   RDW 16.1 (H) 11.5 - 15.5 %   Platelets 211 150 - 400 K/uL  CBC     Status: Abnormal   Collection Time: 01/21/17  2:13 AM  Result Value Ref Range   WBC 6.4 4.0 - 10.5 K/uL   RBC 2.59 (L) 3.87 - 5.11 MIL/uL   Hemoglobin 7.7 (L) 12.0 - 15.0 g/dL   HCT 23.5 (L) 36.0 - 46.0 %   MCV 90.7 78.0 - 100.0 fL   MCH 29.7 26.0 - 34.0 pg   MCHC 32.8 30.0 - 36.0 g/dL   RDW 15.8 (H) 11.5 - 15.5 %   Platelets 246 150 - 400 K/uL   No results found.     Medical Problem List and Plan: 1.  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  DVT Prophylaxis/Anticoagulation: Subcutaneous Lovenox. Monitor platelet counts and any signs of bleeding 3. Pain Management: Neurontin 300 mg daily, Duragesic patch 12.5 g, Robaxin and oxycodone as needed 4. Mood: Valium 2.5 mg daily at bedtime as needed 5. Neuropsych: This patient is capable of making decisions on her own behalf. 6. Skin/Wound Care: Routine skin checks 7. Fluids/Electrolytes/Nutrition: Routine I&O with follow-up chemistries 8. Acute on chronic anemia. Follow-up CBC 9. Atrial fibrillation. Cardiac rate controlled. Continue Plavix 10. Hypertension. Lopressor 25 mg daily, Avapro 75 mg daily, HCTZ 6.25 mg daily. Monitor with increased mobility 11. CKD stage III. Creatinine 1.02 baseline. Follow-up chemistries 12. History of GI bleed. Continue Protonix. 13. Constipation. Laxative assistance  Post Admission Physician  Evaluation: 1. Preadmission assessment reviewed and changes made below.  2. 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 3. Patient is admitted to receive collaborative, interdisciplinary care between the physiatrist, rehab nursing staff, and therapy team. 4. Patient's level of medical complexity and substantial therapy needs in context of that medical necessity cannot be provided at a lesser intensity of care such as a SNF. 5. Patient has experienced substantial functional loss from his/her baseline which was documented above under the "Functional History" and "Functional Status" headings.  Judging by the patient's diagnosis, physical exam, and functional history, the patient has potential for functional progress which will result in measurable gains while on inpatient rehab.  These gains will be of substantial and practical use upon discharge  in facilitating mobility and self-care at the household level. 6. Physiatrist will provide 24 hour management of medical needs as well as oversight of the therapy plan/treatment and provide guidance as appropriate regarding the interaction of the two. 7. The Preadmission Screening has been reviewed and patient status is unchanged unless otherwise stated above. 8. 24 hour rehab nursing will assist with bladder management, safety, skin/wound care, disease management, medication administration, pain management and patient education  and help integrate therapy concepts, techniques,education, etc. 9. PT will assess and treat for/with: Lower extremity strength, range of motion, stamina, balance, functional mobility, safety, adaptive techniques and equipment, woundcare, coping skills, pain control, education.   Goals are: Min A. 10. OT will assess and treat for/with: ADL's, functional mobility, safety, upper extremity strength, adaptive techniques and equipment, wound mgt,  ego support, and community reintegration.   Goals are: Min A. Therapy may not proceed with showering this patient. 11. Case Management and Social Worker will assess and treat for psychological issues and discharge planning. 12. Team conference will be held weekly to assess progress toward goals and to determine barriers to discharge. 13. Patient will receive at least 3 hours of therapy per day at least 5 days per week. 14. ELOS: 9-14 days.       15. Prognosis:  good  Delice Lesch, MD, Mellody Drown Cathlyn Parsons., PA-C 01/21/2017

## 2017-01-21 NOTE — Plan of Care (Signed)
Problem: Bowel/Gastric: Goal: Will not experience complications related to bowel motility Outcome: Progressing Dulcolax suppository given

## 2017-01-21 NOTE — Progress Notes (Signed)
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 Deitra Mayo   HPI: [name] is a 83 y.o. 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 She was able to complete sliding board transfers with minimal assist and transferred mat to wheelchair using sliding board squat pivot technique. She lives with her spouse and family with assistance as needed. Presented 01/15/2017 with left leg numbness and increasing pain. Duplex study ultrasound showed occlusion of left leg bypass site. Underwent left profunda to anterior tibial bypass with redo femoral artery exposure 01/16/2017 per [name]. [facility] course pain management. Acute on chronic anemia 7.2-8.3 and monitored. Subcutaneous Lovenox for DVT prophylaxis. Physical and occupational therapy evaluations completed with recommendations of physical medicine rehabilitation consult.   Review of Systems  Constitutional: Negative for chills and fever.  Eyes: Negative for blurred vision and double vision.  Respiratory: Positive for shortness of breath. Negative for cough.     Cardiovascular: Negative for chest pain.  Gastrointestinal: Positive for constipation. Negative for nausea and vomiting.       GERD  Musculoskeletal: Positive for back pain, joint pain and myalgias.  Skin: Negative for rash.  Neurological: Positive for weakness.  Psychiatric/Behavioral:       Anxiety  All other systems reviewed and are negative.      Past Medical History:  Diagnosis Date  . Anemia   . Anginal pain ([place])   . Arthritis    "qwhere" (09/05/2016)  . Atrial fibrillation ([place]) 09/2016  . Chronic lower back pain   . Complication of anesthesia    "takes a long time for it to wear off; I can hallucinate if I take too much" (09/05/2016)  . DVT (deep venous thrombosis) ([place]) 10/2009  . Fall from steps 08/31/2013   Fx. pelvis, Left Hip, Left Elbow  . Fibromyalgia   . GERD (gastroesophageal reflux disease)    09/22/16- "no too much anymore"  . GI bleed 10/24/2016  . High cholesterol   . History of blood transfusion   . History of hiatal hernia   . Hypertension   . Macular degeneration, wet ([place])    "started in right eye; now legally blind in that eye; now started in left eye but pretty much in control" (09/05/2016)  . Osteoporosis   . Peripheral vascular disease ([place])    nonviable tissue Right foot  . PONV (postoperative nausea and vomiting)   . Squamous cell carcinoma of skin of right calf Aug. 2015  . Stroke ([place])    TIA's no residual  . TIA (transient ischemic attack)    "several at once; none in a long time" (09/05/2016)        Past Surgical History:  Procedure Laterality Date  . ABDOMINAL AORTAGRAM N/A 12/26/2014  Procedure: ABDOMINAL AORTAGRAM;  Surgeon: Serafina Mitchell, MD;  Location: [facility] CATH LAB;  Service: Cardiovascular;  Laterality: N/A;  . AMPUTATION Right 12/23/2016   Procedure: AMPUTATION BELOW KNEE;  Surgeon: Serafina Mitchell, MD;  Location: [place];  Service: Vascular;  Laterality: Right;  . AORTOGRAM N/A 09/26/2016    Procedure: AORTOGRAM;  Surgeon: Waynetta Sandy, MD;  Location: [place];  Service: Vascular;  Laterality: N/A;  . CARPAL TUNNEL RELEASE Right   . CATARACT EXTRACTION W/ INTRAOCULAR LENS  IMPLANT, BILATERAL Bilateral   . COLONOSCOPY    . DILATION AND CURETTAGE OF UTERUS    . DRESSING CHANGE UNDER ANESTHESIA Right 01/16/2017   Procedure: DRESSING CHANGE RIGHT BELOW KNEE AMPUTATION;  Surgeon: Serafina Mitchell, MD;  Location: [place];  Service: Vascular;  Laterality: Right;  . EYE SURGERY Right    "macular OR"  . FEMORAL ARTERY STENT  12-11-10   Left SFA  . FEMORAL-POPLITEAL BYPASS GRAFT Left 09/24/2016   Procedure: REDO FEMORAL TO POPLITEAL ARTERY BYPASS GRAFT USING 6MM PROPATEN RINGED GORTEX GRAFT;  Surgeon: Serafina Mitchell, MD;  Location: [place];  Service: Vascular;  Laterality: Left;  . FEMORAL-TIBIAL BYPASS GRAFT Left 01/16/2017   Procedure: REDO BYPASS GRAFT FEMORAL-TIBIAL ARTERY WITH GORTEX GRAFT;  Surgeon: Serafina Mitchell, MD;  Location: [place];  Service: Vascular;  Laterality: Left;  AND LOWER LEG   . I&D EXTREMITY Right 12/17/2016   Procedure: IRRIGATION AND DEBRIDEMENT RIGHT FOOT;  Surgeon: Serafina Mitchell, MD;  Location: [place];  Service: Vascular;  Laterality: Right;  . INCISION AND DRAINAGE OF WOUND Left 10/25/2009   leg/notes 11/13/2009  . INSERTION OF ILIAC STENT Left 12/26/2014   Procedure: INSERTION OF ILIAC STENT;  Surgeon: Serafina Mitchell, MD;  Location: [facility] CATH LAB;  Service: Cardiovascular;  Laterality: Left;  . INSERTION OF ILIAC STENT Left 09/26/2016   Procedure: SUB INTIMAL INSERTION OF SUPERFICIAL FEMORAL ARTERY AND BELOW KNEE BYPASS GRAFT;  Surgeon: Waynetta Sandy, MD;  Location: [place];  Service: Vascular;  Laterality: Left;  . JOINT REPLACEMENT     knee  . JOINT REPLACEMENT Left Oct. 17, 2014   Elbow ( pt fell 08-31-13 )  . ORIF SHOULDER FRACTURE Right    "it was crushed"  . PERIPHERAL VASCULAR CATHETERIZATION N/A 10/30/2015    Procedure: Abdominal Aortogram w/Lower Extremity;  Surgeon: Serafina Mitchell, MD;  Location: [place] CV LAB;  Service: Cardiovascular;  Laterality: N/A;  . PERIPHERAL VASCULAR CATHETERIZATION  10/30/2015   Procedure: Peripheral Vascular Intervention;  Surgeon: Serafina Mitchell, MD;  Location: [place] CV LAB;  Service: Cardiovascular;;  . PERIPHERAL VASCULAR CATHETERIZATION N/A 04/01/2016   Procedure: Abdominal Aortogram w/Lower Extremity;  Surgeon: Serafina Mitchell, MD;  Location: [place] CV LAB;  Service: Cardiovascular;  Laterality: N/A;  . PERIPHERAL VASCULAR CATHETERIZATION Left 04/01/2016   Procedure: Peripheral Vascular Atherectomy;  Surgeon: Serafina Mitchell, MD;  Location: [place] CV LAB;  Service: Cardiovascular;  Laterality: Left;  Superficial femoral artery.  Marland Kitchen PERIPHERAL VASCULAR CATHETERIZATION Right 09/05/2016   "stent"  . PERIPHERAL VASCULAR CATHETERIZATION N/A 09/05/2016   Procedure: Abdominal Aortogram w/Lower Extremity;  Surgeon: Serafina Mitchell, MD;  Location: [place] CV LAB;  Service: Cardiovascular;  Laterality: N/A;  . PERIPHERAL VASCULAR CATHETERIZATION Right 09/05/2016   Procedure: Peripheral Vascular Intervention;  Surgeon: Serafina Mitchell, MD;  Location: [place] CV LAB;  Service: Cardiovascular;  Laterality: Right;  Superficial Femoral  . PERIPHERAL VASCULAR CATHETERIZATION Left 09/09/2016  Procedure: Lower Extremity Angiography;  Surgeon: Serafina Mitchell, MD;  Location: [place] CV LAB;  Service: Cardiovascular;  Laterality: Left;  . PR VEIN BYPASS GRAFT,AORTO-FEM-POP  09-13-09   Left Fem-pop  . THROMBECTOMY FEMORAL ARTERY Right 09/26/2016   Procedure: Thromboembolectomy Right Lower Extremity, Right Femoral Artery Endarterectomy with Patch Angioplasty; Right Lower Extremity Angiogram ;  Surgeon: Waynetta Sandy, MD;  Location: [place];  Service: Vascular;  Laterality: Right;  . TOTAL ELBOW ARTHROPLASTY Left 09/03/2013    Procedure: LEFT TOTAL ELBOW ARTHROPLASTY;  Surgeon: Roseanne Kaufman, MD;  Location: [place];  Service: Orthopedics;  Laterality: Left;  . TOTAL KNEE ARTHROPLASTY Left 06/2006  . TRANSMETATARSAL AMPUTATION Right 12/11/2016   Procedure: TRANSMETATARSAL AMPUTATION;  Surgeon: Serafina Mitchell, MD;  Location: [place];  Service: Vascular;  Laterality: Right;  . TUBAL LIGATION    . VAGINAL HYSTERECTOMY           Family History  Problem Relation Age of Onset  . Heart disease Father     Heart Disease before age 29  . Hyperlipidemia Father   . Hypertension Father   . Alcohol abuse Father   . Heart disease Brother   . Hyperlipidemia Brother   . Hypertension Brother   . Deep vein thrombosis Brother   . Heart disease Son     Heart Disease before age 63  . Hyperlipidemia Son   . Hypertension Son   . Heart attack Son   . Diabetes Son   . Hypertension Son   . Hyperlipidemia Sister   . Hypertension Sister    Social History:  reports that she quit smoking about 70 years ago. Her smoking use included Cigarettes. She has never used smokeless tobacco. She reports that she does not drink alcohol or use drugs. Allergies:       Allergies  Allergen Reactions  . Motrin [Ibuprofen] Other (See Comments)    ADVERSE REACTION - GI BLEED  . Statins Other (See Comments)    ADVERSE REACTION MUSCLE PAIN & WEAKNESS  . Morphine And Related Other (See Comments)    HALLUCINATIONS REACTION IS SIDE EFFECT  . Oxycontin [Oxycodone Hcl] Other (See Comments)    [REACTION IS SIDE EFFECT]  > HALLUCINATIONS  . Promethazine Hcl Other (See Comments)    IV  Drug only, makes her act crazy  . Sulfa Antibiotics Nausea And Vomiting         Medications Prior to Admission  Medication Sig Dispense Refill  . acetaminophen (TYLENOL) 500 MG tablet Take 1,000 mg by mouth 2 (two) times daily as needed for moderate pain or headache. Patient took this medication for her pain.    . Cholecalciferol  (VITAMIN D3) 2000 UNITS TABS Take 2,000 Units by mouth at bedtime.     . clopidogrel (PLAVIX) 75 MG tablet Take 1 tablet (75 mg total) by mouth daily. 30 tablet 1  . Cyanocobalamin (VITAMIN B-12 PO) Take 1 tablet by mouth daily.    . diazepam (VALIUM) 5 MG tablet Take 0.5 tablets (2.5 mg total) by mouth at bedtime as needed for anxiety. 15 tablet 0  . fentaNYL (DURAGESIC - DOSED MCG/HR) 12 MCG/HR Place 1 patch (12.5 mcg total) onto the skin every 3 (three) days. 15 patch 0  . gabapentin (NEURONTIN) 300 MG capsule Take 1 capsule (300 mg total) by mouth daily at 8 pm. 30 capsule 1  . Hydrocortisone (GERHARDT'S BUTT CREAM) CREA Apply 1 application topically 2 (two) times daily. 1 each 1  . loperamide (  IMODIUM) 2 MG capsule Take 2 mg by mouth as needed for diarrhea or loose stools.    . methocarbamol (ROBAXIN) 500 MG tablet Take 1 tablet (500 mg total) by mouth every 6 (six) hours as needed for muscle spasms. 60 tablet 0  . metoprolol tartrate (LOPRESSOR) 25 MG tablet Take 1 tablet (25 mg total) by mouth at bedtime. 30 tablet 1  . oxyCODONE (OXY IR/ROXICODONE) 5 MG immediate release tablet Take 1 tablet (5 mg total) by mouth every 4 (four) hours as needed for moderate pain, severe pain or breakthrough pain. 30 tablet 0  . pantoprazole (PROTONIX) 40 MG tablet Take 1 tablet (40 mg total) by mouth daily. 30 tablet 1  . potassium chloride (K-DUR,KLOR-CON) 10 MEQ tablet Take 1 tablet (10 mEq total) by mouth daily. 30 tablet 0  . valsartan-hydrochlorothiazide (DIOVAN-HCT) 160-12.5 MG tablet Take 0.5 tablets by mouth daily. 30 tablet 0  . Multiple Vitamins-Minerals (EYE VITAMINS PO) Take 1 tablet by mouth at bedtime.       Home: Home Living Family/patient expects to be discharged to:: Private residence Living Arrangements: Spouse/significant other Available Help at Discharge: Family, Available 24 hours/day Type of Home: House Home Access: [place]: One level Bathroom  Shower/Tub: Gaffer, Other (comment), Door Bathroom Toilet: Standard Home Equipment: Environmental consultant - 2 wheels, Bedside commode, Cane - single point, Grab bars - tub/shower, Wheelchair - manual, Other (comment), Shower seat (BSC x2) Additional Comments: pt family is planning for 24/7 supervision as needed. Pt sleeps in a recliner since elbow replacement years ago  Lives With: Spouse  Functional History: Prior Function Level of Independence: Needs assistance Gait / Transfers Assistance Needed: Pt has been standing to pivot with assist since BKA, propelling WC 100', does not prefer sliding board ADL's / Homemaking Assistance Needed: assist with LB ADLs and shower transfer Functional Status:  Mobility: Bed Mobility Overal bed mobility: Needs Assistance Bed Mobility: Rolling Rolling: Mod assist Supine to sit: Mod assist Sit to supine: Max assist General bed mobility comments: increased time with assist to elevate trunk, move legs and use of bed to fully scoot to EOB, Max assist with assist for full body translation from sitting to supine. 2 person assist to scoot to [facility] Transfers Overall transfer level: Needs assistance Transfers: Sit to/from Stand Sit to Stand: Total assist General transfer comment: max multimodal cues with pt with tendency for posterior lean, limited ability to block and utilize knee for assist due to sensitive skin. Use of bed pad and bed elevation for rise and anterior translation. Able to place weight on LLE momentarily before sitting limited by pain. Max assist to scoot back onto surface fully. Attempted to stand 3x total with one successful attempt Ambulation/Gait General Gait Details: unable  ADL: ADL Overall ADL's : Needs assistance/impaired Grooming: Wash/dry face, Brushing hair, Set up, Bed level, Supervision/safety Lower Body Dressing: Bed level, Maximal assistance Toilet Transfer Details (indicate cue type and reason): rolled in bed so bedpan could be  positioned under her-Mod A for rolling ' Toileting- Clothing Manipulation and Hygiene: Bed level, Maximal assistance Toileting - Clothing Manipulation Details (indicate cue type and reason): pt able to wash peri area with washcloth General ADL Comments: cues for precautions for LUE.  Cognition: Cognition Overall Cognitive Status:  (unsure of baseline) Orientation Level: Oriented X4 Cognition Arousal/Alertness: Awake/alert Behavior During Therapy: Flat affect Overall Cognitive Status:  (unsure of baseline) Area of Impairment: Awareness Memory: Decreased short-term memory Awareness:  (pt thinking she was going to  fall and she was lying in bed) General Comments: pt does not recall BKA  Blood pressure (!) 141/60, pulse 87, temperature 97.7 F (36.5 C), temperature source Oral, resp. rate 18, height 5\' 2"  (1.575 m), weight 53.3 kg (117 lb 8.1 oz), SpO2 100 %. Physical Exam  Vitals reviewed. Constitutional: She is oriented to person, place, and time. She appears well-developed.  Frail  HENT:  Head: Normocephalic and atraumatic.  Eyes: Conjunctivae and EOM are normal.  Neck: Normal range of motion. Neck supple. No thyromegaly present.  Cardiovascular: Normal rate and regular rhythm.   Respiratory: Effort normal and breath sounds normal. No respiratory distress.  GI: Soft. Bowel sounds are normal. She exhibits no distension.  Musculoskeletal: She exhibits edema and tenderness.  Right BKA  Neurological: She is alert and oriented to person, place, and time.  Motor: B/l UE 4/5 RLE: HF 3+/5 LLE: 3/5 (pain inhibition)  Skin:  Right BKA site is dressed, c/d/i.  Left lower extremity bypass site clean and dry appropriately tender Incision on LLE c/d/i.  Psychiatric: She has a normal mood and affect. Her behavior is normal.    Lab Results Last 24 Hours       Results for orders placed or performed during the hospital encounter of 01/13/17 (from the past 24 hour(s))  CBC     Status:  Abnormal   Collection Time: 01/20/17  2:10 AM  Result Value Ref Range   WBC 5.4 4.0 - 10.5 K/uL   RBC 2.47 (L) 3.87 - 5.11 MIL/uL   Hemoglobin 7.2 (L) 12.0 - 15.0 g/dL   HCT 22.7 (L) 36.0 - 46.0 %   MCV 91.9 78.0 - 100.0 fL   MCH 29.1 26.0 - 34.0 pg   MCHC 31.7 30.0 - 36.0 g/dL   RDW 16.1 (H) 11.5 - 15.5 %   Platelets 211 150 - 400 K/uL     Imaging Results (Last 48 hours)  No results found.    Assessment/Plan: Diagnosis: Occluded left leg bypass status post redo femoral artery exposure 01/16/2017 as well as history of right BKA 12/23/2016 Labs independently reviewed.  Records reviewed and summated above.  1. Does the need for close, 24 hr/day medical supervision in concert with the patient's rehab needs make it unreasonable for this patient to be served in a less intensive setting? Potentially 2. Co-Morbidities requiring supervision/potential complications: right elbow replacement, atrial fibrillation (cont meds, monitor HR with increased activity), GI bleed with ABLA (transfuse if necessary to ensure appropriate perfusion for increased activity tolerance), chronic low back pain with fibromyalgia with post-op pain (Biofeedback training with therapies to help reduce reliance on opiate pain medications, particularly IV dilaudid, fentanyl, monitor pain control during therapies, and sedation at rest and titrate to maximum efficacy to ensure participation and gains in therapies), chronic kidney disease stage III (avoid nephrotoxic meds), HTN (monitor and provide prns in accordance with increased physical exertion and pain), peripheral vascular disease (cont meds), right BKA, ?UTI (consider Ucx) 3. Due to bowel management, safety, skin/wound care, disease management, medication administration, pain management and patient education, does the patient require 24 hr/day rehab nursing? Yes 4. Does the patient require coordinated care of a physician, rehab nurse, PT (1-2 hrs/day, 5  days/week) and OT (1-2 hrs/day, 5 days/week) to address physical and functional deficits in the context of the above medical diagnosis(es)? Yes Addressing deficits in the following areas: balance, endurance, locomotion, strength, transferring, bathing, dressing, toileting and psychosocial support 5. Can the patient actively participate in an intensive  therapy program of at least 3 hrs of therapy per day at least 5 days per week? Potentially 6. The potential for patient to make measurable gains while on inpatient rehab is excellent 7. Anticipated functional outcomes upon discharge from inpatient rehab are min assist  with PT, min assist with OT, n/a with SLP. 8. Estimated rehab length of stay to reach the above functional goals is: 5-7 days. 9. Does the patient have adequate social supports and living environment to accommodate these discharge functional goals? Yes 10. Anticipated D/C setting: Home 11. Anticipated post D/C treatments: HH therapy and Home excercise program 12. Overall Rehab/Functional Prognosis: good  RECOMMENDATIONS: This patient's condition is appropriate for continued rehabilitative care in the following setting: Pt with signficant pain limiting functioning.  Recommend reeval by therapies as it has been several days when pain better controlled. Patient may be near baseline level of functioning.  Patient has agreed to participate in recommended program. Potentially Note that insurance prior authorization may be required for reimbursement for recommended care.  Comment: Rehab Admissions Coordinator to follow up.  Delice Lesch, MD, Mellody Drown Cathlyn Parsons., PA-C 01/20/2017    Revision History                        Routing History

## 2017-01-22 ENCOUNTER — Inpatient Hospital Stay (HOSPITAL_COMMUNITY): Payer: Medicare Other | Admitting: Occupational Therapy

## 2017-01-22 ENCOUNTER — Inpatient Hospital Stay (HOSPITAL_COMMUNITY): Payer: Medicare Other | Admitting: Physical Therapy

## 2017-01-22 DIAGNOSIS — R5381 Other malaise: Secondary | ICD-10-CM

## 2017-01-22 DIAGNOSIS — E876 Hypokalemia: Secondary | ICD-10-CM

## 2017-01-22 DIAGNOSIS — L89150 Pressure ulcer of sacral region, unstageable: Secondary | ICD-10-CM

## 2017-01-22 DIAGNOSIS — T148XXA Other injury of unspecified body region, initial encounter: Secondary | ICD-10-CM

## 2017-01-22 DIAGNOSIS — Z89511 Acquired absence of right leg below knee: Secondary | ICD-10-CM

## 2017-01-22 DIAGNOSIS — E46 Unspecified protein-calorie malnutrition: Secondary | ICD-10-CM

## 2017-01-22 LAB — COMPREHENSIVE METABOLIC PANEL
ALT: 14 U/L (ref 14–54)
ANION GAP: 8 (ref 5–15)
AST: 19 U/L (ref 15–41)
Albumin: 2.7 g/dL — ABNORMAL LOW (ref 3.5–5.0)
Alkaline Phosphatase: 53 U/L (ref 38–126)
BUN: 10 mg/dL (ref 6–20)
CHLORIDE: 104 mmol/L (ref 101–111)
CO2: 24 mmol/L (ref 22–32)
Calcium: 9.1 mg/dL (ref 8.9–10.3)
Creatinine, Ser: 0.82 mg/dL (ref 0.44–1.00)
Glucose, Bld: 100 mg/dL — ABNORMAL HIGH (ref 65–99)
POTASSIUM: 3.4 mmol/L — AB (ref 3.5–5.1)
Sodium: 136 mmol/L (ref 135–145)
TOTAL PROTEIN: 5.7 g/dL — AB (ref 6.5–8.1)
Total Bilirubin: 0.7 mg/dL (ref 0.3–1.2)

## 2017-01-22 LAB — CBC WITH DIFFERENTIAL/PLATELET
BASOS ABS: 0 10*3/uL (ref 0.0–0.1)
Basophils Relative: 0 %
Eosinophils Absolute: 0.4 10*3/uL (ref 0.0–0.7)
Eosinophils Relative: 6 %
HCT: 25.5 % — ABNORMAL LOW (ref 36.0–46.0)
Hemoglobin: 8.2 g/dL — ABNORMAL LOW (ref 12.0–15.0)
LYMPHS PCT: 20 %
Lymphs Abs: 1.4 10*3/uL (ref 0.7–4.0)
MCH: 29.4 pg (ref 26.0–34.0)
MCHC: 32.2 g/dL (ref 30.0–36.0)
MCV: 91.4 fL (ref 78.0–100.0)
MONOS PCT: 13 %
Monocytes Absolute: 0.9 10*3/uL (ref 0.1–1.0)
Neutro Abs: 4.1 10*3/uL (ref 1.7–7.7)
Neutrophils Relative %: 61 %
PLATELETS: 285 10*3/uL (ref 150–400)
RBC: 2.79 MIL/uL — ABNORMAL LOW (ref 3.87–5.11)
RDW: 16.4 % — AB (ref 11.5–15.5)
WBC: 6.9 10*3/uL (ref 4.0–10.5)

## 2017-01-22 MED ORDER — COLLAGENASE 250 UNIT/GM EX OINT
TOPICAL_OINTMENT | Freq: Every day | CUTANEOUS | Status: DC
  Administered 2017-01-22 – 2017-01-27 (×6): via TOPICAL
  Administered 2017-01-28: 1 via TOPICAL
  Filled 2017-01-22: qty 30

## 2017-01-22 NOTE — IPOC Note (Signed)
Overall Plan of Care [facility]) Patient Details Name: [name] MRN: [id number] DOB: [birth year]  Admitting Diagnosis: [facility] Problems: Active Problems:   Debilitated   Decubitus ulcer of sacral region, unstageable ([place])   Deep tissue injury   Hypokalemia   Hypoalbuminemia due to protein-calorie malnutrition ([place])     Functional Problem List: Nursing Bowel, Endurance, Medication Management, Pain, Nutrition, Safety, Sensory, Skin Integrity  PT Balance, Edema, Endurance, Motor, Pain, Safety, Sensory, Skin Integrity  OT Balance, Endurance, Motor, Pain  SLP    TR         Basic ADL's: OT Bathing, Toileting, Dressing     Advanced  ADL's: OT       Transfers: PT Bed Mobility, Bed to Chair, Car  OT Toilet     Locomotion: PT Wheelchair Mobility     Additional Impairments: OT None  SLP        TR      Anticipated Outcomes Item Anticipated Outcome  Self Feeding    Swallowing      Basic self-care  Min A  Toileting  min A   Bathroom Transfers Min A  Bowel/Bladder  Minimal assist  Transfers  min A  Locomotion  supervision wheelchair level  Communication     Cognition     Pain  3 or less  Safety/Judgment  mod assist   Therapy Plan: PT Intensity: Minimum of 1-2 x/day ,45 to 90 minutes PT Frequency: 5 out of 7 days PT Duration Estimated Length of Stay: 10-12 days OT Intensity: Minimum of 1-2 x/day, 45 to 90 minutes OT Frequency: 5 out of 7 days OT Duration/Estimated Length of Stay: 7-10 days         Team Interventions: Nursing Interventions Patient/Family Education, Bowel Management, Disease Management/Prevention, Pain Management, Skin Care/Wound Management, Medication Management  PT interventions Balance/vestibular training, Community reintegration, Discharge planning, Disease management/prevention, DME/adaptive equipment instruction, Functional mobility training, Neuromuscular re-education, Pain management, Patient/family education,  Psychosocial support, Skin care/wound management, Therapeutic Activities, Therapeutic Exercise, UE/LE Strength taining/ROM, UE/LE Coordination activities, Wheelchair propulsion/positioning  OT Interventions Training and development officer, Discharge planning, DME/adaptive equipment instruction, Functional mobility training, Pain management, Patient/family education, Self Care/advanced ADL retraining, Therapeutic Activities, Therapeutic Exercise, UE/LE Strength taining/ROM  SLP Interventions    TR Interventions    SW/CM Interventions Discharge Planning, Psychosocial Support, Patient/Family Education    Team Discharge Planning: Destination: PT-Home ,OT- Home , SLP-  Projected Follow-up: PT-Home health PT, 24 hour supervision/assistance, OT-  Home health OT, SLP-  Projected Equipment Needs: PT-None recommended by PT, OT- To be determined, SLP-  Equipment Details: PT-pt has needed DME, OT-Pt has BSC, w/c, may need L elevating leg rest Patient/family involved in discharge planning: PT- Patient,  OT-Patient, Family member/caregiver (pt not agreeable to CIR but family is), SLP-   MD ELOS: 9-13 days.  Medical Rehab Prognosis:  Good Assessment: 83 y.o.right hand femalewith history of right elbow replacement, atrial fibrillation maintained on Plavix, GI bleed, chronic low back pain with fibromyalgia, chronic kidney disease stage III, hypertension, peripheral vascular disease with femoral-popliteal bypass grafting 09/24/2016 as well as 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 She was able to complete sliding board transfers with minimal assist and transferred mat to wheelchair using sliding board squat pivot technique. Presented back 01/13/2017 with left leg numbness and increasing pain. Duplex study ultrasound showed occlusion of left leg bypass site. Underwent left profunda to anterior tibial bypass with redo femoral  artery  exposure 01/16/2017 per [name]. [facility] course pain management and severe constipation, acute on chronic anemia. Pt with resulting functional deficits with mobility, transfers, and safety.  Will set goals for Min A with PT/OT.   See Team Conference Notes for weekly updates to the plan of care

## 2017-01-22 NOTE — Evaluation (Signed)
Occupational Therapy Assessment and Plan  Patient Details  Name: [name] MRN: [id number] Date of Birth: [birth year]  OT Diagnosis: acute pain and muscle weakness (generalized) Rehab Potential: Rehab Potential (ACUTE ONLY): Good ELOS: 7-10 days   Today's Date: 01/22/2017  Session 1 OT Individual Time: 0803-0900 OT Individual Time Calculation (min): 57 min     Session 2 OT Individual Time: 1418-1500 OT Individual Time Calculation (min): 42 min     Problem List:  Patient Active Problem List   Diagnosis Date Noted  . Decubitus ulcer of sacral region, unstageable ([place])   . Deep tissue injury   . Hypokalemia   . Hypoalbuminemia due to protein-calorie malnutrition ([place])   . Debilitated 01/21/2017  . S/P BKA (below knee amputation) unilateral, right ([place])   . Neuropathic pain   . Post-operative pain   . Slow transit constipation   . Debility   . Ischemic leg   . Hx of right BKA ([place])   . PAF (paroxysmal atrial fibrillation) ([place])   . Anemia of chronic disease   . Chronic pain syndrome   . Fibromyalgia   . Stage 3 chronic kidney disease   . Benign essential HTN   . Abnormal urinalysis   . PVD (peripheral vascular disease) ([place]) 01/13/2017  . Post-op pain 01/03/2017  . Phantom limb pain ([place]) 01/03/2017  . Status post below knee amputation of right lower extremity ([place]) 12/30/2016  . Acute on chronic combined systolic and diastolic CHF (congestive heart failure) ([place]) 12/22/2016  . Anemia 12/22/2016  . Palliative care encounter   . Lower extremity pain, right   . Acute GI bleeding 10/23/2016  . Paroxysmal atrial fibrillation ([place]) 10/03/2016  . 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 PAD (peripheral artery disease) ([place]) 09/05/2016  . Groin pain 04/02/2015  . Aftercare following surgery of the circulatory system, [place] 12/13/2013  . Shingles 10/26/2013  . Anxiety 10/26/2013  . Acute blood loss anemia  09/05/2013  . Elbow fracture, left 09/05/2013  . Left acetabular fracture ([place]) 09/05/2013  . Ankle fracture, left 09/05/2013  . HTN (hypertension) 09/03/2013  . HLD (hyperlipidemia) 09/03/2013  . Peripheral vascular disease, unspecified 05/10/2012  . Chronic total occlusion of artery of the extremities ([place]) 01/19/2012    Past Medical History:  Past Medical History:  Diagnosis Date  . Anemia   . Anginal pain ([place])   . Arthritis    "qwhere" (09/05/2016) 83  . Atrial fibrillation ([place]) 09/2016  . Chronic lower back pain   . Complication of anesthesia    "takes a long time for it to wear off; I can hallucinate if I take too much" (09/05/2016)  . DVT (deep venous thrombosis) ([place]) 10/2009  . Fall from steps 08/31/2013   Fx. pelvis, Left Hip, Left Elbow 83  . Fibromyalgia   . GERD (gastroesophageal reflux disease)    09/22/16- "no too much anymore"  . GI bleed 10/24/2016  . High cholesterol   . History of blood transfusion   . History of hiatal hernia   . Hypertension   . Macular degeneration, wet ([place])    "started in right eye; now legally blind in that eye; now started in left eye but pretty much in control" (09/05/2016) 83  . Osteoporosis   . Peripheral vascular disease ([place])    nonviable tissue Right foot  . PONV (postoperative nausea and vomiting)   . Squamous cell carcinoma of skin of right calf Aug. 2015  .  Stroke ([place])    TIA's no residual  . TIA (transient ischemic attack)    "several at once; none in a long time" (09/05/2016)   Past Surgical History:  Past Surgical History:  Procedure Laterality Date  . ABDOMINAL AORTAGRAM N/A 12/26/2014   Procedure: ABDOMINAL Maxcine Ham;  Surgeon: Serafina Mitchell, MD;  Location: [facility] LAB;  Service: Cardiovascular;  Laterality: N/A;  . AMPUTATION Right 12/23/2016   Procedure: AMPUTATION BELOW KNEE;  Surgeon: Serafina Mitchell, MD;  Location: [facility];  Service: Vascular;  Laterality: Right;  . AORTOGRAM N/A 09/26/2016   Procedure: AORTOGRAM;   Surgeon: Waynetta Sandy, MD;  Location: [facility];  Service: Vascular;  Laterality: N/A;  . CARPAL TUNNEL RELEASE Right   . CATARACT EXTRACTION W/ INTRAOCULAR LENS  IMPLANT, BILATERAL Bilateral   . COLONOSCOPY    . DILATION AND CURETTAGE OF UTERUS    . DRESSING CHANGE UNDER ANESTHESIA Right 01/16/2017   Procedure: DRESSING CHANGE RIGHT BELOW KNEE AMPUTATION;  Surgeon: Serafina Mitchell, MD;  Location: [facility];  Service: Vascular;  Laterality: Right;  . EYE SURGERY Right    "macular OR"  . FEMORAL ARTERY STENT  12-11-10   Left SFA  . FEMORAL-POPLITEAL BYPASS GRAFT Left 09/24/2016   Procedure: REDO FEMORAL TO POPLITEAL ARTERY BYPASS GRAFT USING 6MM PROPATEN RINGED GORTEX GRAFT;  Surgeon: Serafina Mitchell, MD;  Location: [facility];  Service: Vascular;  Laterality: Left;  . FEMORAL-TIBIAL BYPASS GRAFT Left 01/16/2017   Procedure: REDO BYPASS GRAFT FEMORAL-TIBIAL ARTERY WITH GORTEX GRAFT;  Surgeon: Serafina Mitchell, MD;  Location: [facility];  Service: Vascular;  Laterality: Left;  AND LOWER LEG   . I&D EXTREMITY Right 12/17/2016   Procedure: IRRIGATION AND DEBRIDEMENT RIGHT FOOT;  Surgeon: Serafina Mitchell, MD;  Location: [place];  Service: Vascular;  Laterality: Right;  . INCISION AND DRAINAGE OF WOUND Left 10/25/2009   leg/notes 11/13/2009  . INSERTION OF ILIAC STENT Left 12/26/2014   Procedure: INSERTION OF ILIAC STENT;  Surgeon: Serafina Mitchell, MD;  Location: [facility] CATH LAB;  Service: Cardiovascular;  Laterality: Left;  . INSERTION OF ILIAC STENT Left 09/26/2016   Procedure: SUB INTIMAL INSERTION OF SUPERFICIAL FEMORAL ARTERY AND BELOW KNEE BYPASS GRAFT;  Surgeon: Waynetta Sandy, MD;  Location: [place];  Service: Vascular;  Laterality: Left;  . JOINT REPLACEMENT     knee  . JOINT REPLACEMENT Left Oct. 17, 2014   Elbow ( pt fell 08-31-13 )  . ORIF SHOULDER FRACTURE Right    "it was crushed"  . PERIPHERAL VASCULAR CATHETERIZATION N/A 10/30/2015   Procedure: Abdominal Aortogram w/Lower Extremity;   Surgeon: Serafina Mitchell, MD;  Location: [place] CV LAB;  Service: Cardiovascular;  Laterality: N/A;  . PERIPHERAL VASCULAR CATHETERIZATION  10/30/2015   Procedure: Peripheral Vascular Intervention;  Surgeon: Serafina Mitchell, MD;  Location: [place] CV LAB;  Service: Cardiovascular;;  . PERIPHERAL VASCULAR CATHETERIZATION N/A 04/01/2016   Procedure: Abdominal Aortogram w/Lower Extremity;  Surgeon: Serafina Mitchell, MD;  Location: [place] CV LAB;  Service: Cardiovascular;  Laterality: N/A;  . PERIPHERAL VASCULAR CATHETERIZATION Left 04/01/2016   Procedure: Peripheral Vascular Atherectomy;  Surgeon: Serafina Mitchell, MD;  Location: [facility] CV LAB;  Service: Cardiovascular;  Laterality: Left;  Superficial femoral artery.  Marland Kitchen PERIPHERAL VASCULAR CATHETERIZATION Right 09/05/2016   "stent"  . PERIPHERAL VASCULAR CATHETERIZATION N/A 09/05/2016   Procedure: Abdominal Aortogram w/Lower Extremity;  Surgeon: Serafina Mitchell, MD;  Location: [facility] CV LAB;  Service: Cardiovascular;  Laterality: N/A;  . PERIPHERAL VASCULAR CATHETERIZATION Right 09/05/2016   Procedure: Peripheral Vascular Intervention;  Surgeon: Serafina Mitchell, MD;  Location: [facility] CV LAB;  Service: Cardiovascular;  Laterality: Right;  Superficial Femoral  . PERIPHERAL VASCULAR CATHETERIZATION Left 09/09/2016   Procedure: Lower Extremity Angiography;  Surgeon: Serafina Mitchell, MD;  Location: [facility] CV LAB;  Service: Cardiovascular;  Laterality: Left;  . PR VEIN BYPASS GRAFT,AORTO-FEM-POP  09-13-09   Left Fem-pop  . THROMBECTOMY FEMORAL ARTERY Right 09/26/2016   Procedure: Thromboembolectomy Right Lower Extremity, Right Femoral Artery Endarterectomy with Patch Angioplasty; Right Lower Extremity Angiogram ;  Surgeon: Waynetta Sandy, MD;  Location: [facility];  Service: Vascular;  Laterality: Right;  . TOTAL ELBOW ARTHROPLASTY Left 09/03/2013   Procedure: LEFT TOTAL ELBOW ARTHROPLASTY;  Surgeon: Roseanne Kaufman, MD;   Location: [facility];  Service: Orthopedics;  Laterality: Left;  . TOTAL KNEE ARTHROPLASTY Left 06/2006  . TRANSMETATARSAL AMPUTATION Right 12/11/2016   Procedure: TRANSMETATARSAL AMPUTATION;  Surgeon: Serafina Mitchell, MD;  Location: [place];  Service: Vascular;  Laterality: Right;  . TUBAL LIGATION    . VAGINAL HYSTERECTOMY      Assessment & Plan Clinical Impression: Patient is a 83 y.o. year old female with history of right elbow replacement, atrial fibrillation maintained on Plavix, GI bleed, chronic low back pain with fibromyalgia, chronic kidney disease stage III, hypertension, peripheral vascular disease with femoral-popliteal bypass grafting 09/24/2016 as well as 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 She was able to complete sliding board transfers with minimal assist and transferred mat to wheelchair using sliding board squat pivot technique. She lives with her spouse and family with assistance as needed. Presented 01/13/2017 with left leg numbness and increasing pain. Duplex study ultrasound showed occlusion of left leg bypass site. Underwent left profunda to anterior tibial bypass with redo femoral artery exposure 01/16/2017.  Patient transferred to CIR on 01/21/2017 .    Patient currently requires Max Assist with basic self-care skills secondary to muscle weakness and decreased sitting balance, and decreased balance strategies.  Prior to hospitalization, patient could complete ADL with supervision.  Patient will benefit from skilled intervention to decrease level of assist with basic self-care skills prior to discharge home with care partner.  Anticipate patient will require minimal physical assistance and follow up home health.  OT - End of Session Activity Tolerance: Tolerates < 10 min activity, no significant change in vital signs Endurance Deficit: Yes Endurance Deficit Description: fatigues easily,  pain limiting OT Assessment Rehab Potential (ACUTE ONLY): Good OT Patient demonstrates impairments in the following area(s): Balance;Endurance;Motor;Pain OT Basic ADL's Functional Problem(s): Bathing;Toileting;Dressing OT Transfers Functional Problem(s): Toilet OT Additional Impairment(s): None OT Plan OT Intensity: Minimum of 1-2 x/day, 45 to 90 minutes OT Frequency: 5 out of 7 days OT Duration/Estimated Length of Stay: 7-10 days OT Treatment/Interventions: Balance/vestibular training;Discharge planning;DME/adaptive equipment instruction;Functional mobility training;Pain management;Patient/family education;Self Care/advanced ADL retraining;Therapeutic Activities;Therapeutic Exercise;UE/LE Strength taining/ROM OT Basic Self-Care Anticipated Outcome(s): Min A OT Toileting Anticipated Outcome(s): min A OT Bathroom Transfers Anticipated Outcome(s): Min A OT Recommendation Patient destination: Home Follow Up Recommendations: Home health OT Equipment Recommended: To be determined Equipment Details: Pt has BSC, w/c, may need L elevating leg rest   Skilled Therapeutic Intervention Session 1 Initial eval completed with treatment provided to address modified bathing/dressing, sitting balance, and bed mobility. Pt nauseous upon OT arrival, agreeable to attempt to eat breakfast seated EOB.  Mod A to transfer to sit EOB. Intermittent Min A to maintain sitting balance while eating breakfast. Set-up A to open containers. Bathing/dressing at EOB with set-up and overall Mod A. Increased time and multiple rest breaks 2/2 pain and fatigue. Pt reported need for bathroom, attempted slide-board transfer, and AP transfer, but 2/2 urgency, pt unable to complete transfer safely. Returned to bed for bed pan placement.   Session 2 1:1 OT session focused on transfer training, pt/family education, wc modifications, and L LE strengthening. Slide-board transfer from bed>wc with Mod A/MaxA.  Pt required assistance for  board placement, and manual facilitation for weight shifting. Pt with unstable L ankle, progressing to max A 1/2 way through transfer. Added elevating leg rest to patients personal w/c and adjusted leg rest appropriately. Ankle there-ex completed, then pt left seated in wc at end of session with needs met and family present.   OT Evaluation Precautions/Restrictions  Precautions Precautions: Fall Precaution Comments: pt not to push or pull with Lt elbow more than 5 lbs (s/p total elbow replacement) , R BKA, LLE painful Restrictions Weight Bearing Restrictions: Yes LUE Weight Bearing: Partial weight bearing LUE Partial Weight Bearing Percentage or Pounds: no more than 5 lbs 2/2 old total elbow replacement RLE Weight Bearing: Non weight bearing LLE Weight Bearing: Weight bearing as tolerated Pain Pain Assessment Pain Assessment: No/denies pain Pain Score: 5  Pain Type: Surgical pain Pain Location: Leg Pain Orientation: Left Pain Descriptors / Indicators: Sore Pain Onset: With Activity Pain Intervention(s): Repositioned Home Living/Prior Functioning Home Living Living Arrangements: Spouse/significant other Available Help at Discharge: Family, Available 24 hours/day Type of Home: House Home Access: Ramped entrance Home Layout: One level Bathroom Shower/Tub: Walk-in shower, Other (comment), Door (has 2 BSC - one in shower and one over toilet) Bathroom Toilet: Standard Bathroom Accessibility: Yes Additional Comments: Sleeps in recliner at home  Lives With: Spouse IADL History Homemaking Responsibilities: No Prior Function Level of Independence: Needs assistance with ADLs, Needs assistance with tranfers  Able to Take Stairs?: No Driving: No Vocation: Retired ADL ADL ADL Comments: Please see functional navigator Vision/Perception  Vision- History Baseline Vision/History: Wears glasses Wears Glasses: At all times Patient Visual Report: No change from baseline Vision-  Assessment Vision Assessment?: No apparent visual deficits  Cognition Overall Cognitive Status: Within Functional Limits for tasks assessed Arousal/Alertness: Awake/alert Orientation Level: Person;Place;Situation Person: Oriented Place: Oriented Situation: Oriented Year: 2018 Month: March Day of Week: Correct Memory: Appears intact Immediate Memory Recall: Sock;Blue;Bed Memory Recall: Sock;Blue;Bed Memory Recall Sock: Without Cue Memory Recall Blue: Without Cue Memory Recall Bed: Without Cue Problem Solving: Appears intact Safety/Judgment: Appears intact Sensation Sensation Light Touch: Impaired Detail Light Touch Impaired Details: Impaired LLE Stereognosis: Appears Intact Hot/Cold: Appears Intact Proprioception: Appears Intact Coordination Gross Motor Movements are Fluid and Coordinated: No Fine Motor Movements are Fluid and Coordinated: No Finger Nose Finger Test: not tested Motor  Motor Motor - Skilled Clinical Observations: generalized muscle weakness Motor - Discharge Observations: Generalized weakness Mobility  Bed Mobility Bed Mobility: Supine to Sit Supine to Sit: 5: Supervision;With rails;HOB elevated  Trunk/Postural Assessment  Cervical Assessment Cervical Assessment: Within Functional Limits Thoracic Assessment Thoracic Assessment: Within Functional Limits Lumbar Assessment Lumbar Assessment: Exceptions to [facility] (Posterior pelvic tilt) Postural Control Postural Control: Within Functional Limits  Balance Balance Balance Assessed: Yes Extremity/Trunk Assessment RUE Assessment RUE Assessment: Exceptions to [facility] RUE AROM (degrees) RUE Overall AROM Comments: R shoulder AROM limited to 50- 60 degrees from fx in 2014 LUE Assessment LUE  Assessment: Exceptions to [facility] LUE AROM (degrees) LUE Overall AROM Comments: WFL, no weight bearing through elbow of more than 5 lbs from total elbow arthroplasty   See Function Navigator for Current Functional Status.    Refer to Care Plan for Long Term Goals  Recommendations for other services: None    Discharge Criteria: Patient will be discharged from OT if patient refuses treatment 3 consecutive times without medical reason, if treatment goals not met, if there is a change in medical status, if patient makes no progress towards goals or if patient is discharged from hospital.  The above assessment, treatment plan, treatment alternatives and goals were discussed and mutually agreed upon: by patient and by family  Valma Cava 01/22/2017, 5:11 PM

## 2017-01-22 NOTE — Care Management Note (Signed)
[facility] Individual Statement of Services  Patient Name:  [name]  Date:  01/22/2017  Welcome to the [facility].  Our goal is to provide you with an individualized program based on your diagnosis and situation, designed to meet your specific needs.  With this comprehensive rehabilitation program, you will be expected to participate in at least 3 hours of rehabilitation therapies Monday-Friday, with modified therapy programming on the weekends.  Your rehabilitation program will include the following services:  Physical Therapy (PT), Occupational Therapy (OT), 24 hour per day rehabilitation nursing, Therapeutic Recreaction (TR), Case Management (Social Worker), Rehabilitation Medicine, Nutrition Services and Pharmacy Services  Weekly team conferences will be held on Wednesday to discuss your progress.  Your Social Worker will talk with you frequently to get your input and to update you on team discussions.  Team conferences with you and your family in attendance may also be held.  Expected length of stay: 8-12 days  Overall anticipated outcome: min assist level  Depending on your progress and recovery, your program may change. Your Social Worker will coordinate services and will keep you informed of any changes. Your Social Worker's name and contact numbers are listed  below.  The following services may also be recommended but are not provided by the [facility]:   [place] will be made to provide these services after discharge if needed.  Arrangements include referral to agencies that provide these services.  Your insurance has been verified to be:  [place] Your primary doctor is:  Josetta Huddle  Pertinent information will be shared with your doctor and your insurance company.  Social Worker:  Ovidio Kin, [place] or ([phone]  Information discussed with and copy given to patient by: [name], 01/22/2017, 12:01 PM

## 2017-01-22 NOTE — Progress Notes (Signed)
Orthopedic Tech Progress Note Patient Details:  [name] 1928-07-17 [id number]  Patient ID: [name], female   DOB: Dec 16, 1927, 83 y.o.   MRN: [id number]   [name] 01/22/2017, 9:21 AM Called in advanced brace order; spoke with Carolyne Fiscal

## 2017-01-22 NOTE — Evaluation (Signed)
Physical Therapy Assessment and Plan  Patient Details  Name: [name] MRN: [id number] Date of Birth: 1927-11-21  PT Diagnosis: Abnormal posture, Difficulty walking, Impaired sensation, Muscle weakness and Pain in left lower extremity Rehab Potential: Fair ELOS: 10-12 days   Today's Date: 01/22/2017 PT Individual Time: 0920-1030 and 4098-1191 PT Individual Time Calculation (min): 70 min and 40 min    Problem List:  Patient Active Problem List   Diagnosis Date Noted  . Decubitus ulcer of sacral region, unstageable ([place])   . Deep tissue injury   . Hypokalemia   . Hypoalbuminemia due to protein-calorie malnutrition ([place])   . Debilitated 01/21/2017  . S/P BKA (below knee amputation) unilateral, right ([place])   . Neuropathic pain   . Post-operative pain   . Slow transit constipation   . Debility   . Ischemic leg   . Hx of right BKA ([place])   . PAF (paroxysmal atrial fibrillation) ([place])   . Anemia of chronic disease   . Chronic pain syndrome   . Fibromyalgia   . Stage 3 chronic kidney disease   . Benign essential HTN   . Abnormal urinalysis   . PVD (peripheral vascular disease) ([place]) 01/13/2017  . Post-op pain 01/03/2017  . Phantom limb pain ([place]) 01/03/2017  . Status post below knee amputation of right lower extremity ([place]) 12/30/2016  . Acute on chronic combined systolic and diastolic CHF (congestive heart failure) ([place]) 12/22/2016  . Anemia 12/22/2016  . Palliative care encounter   . Lower extremity pain, right   . Acute GI bleeding 10/23/2016  . Paroxysmal atrial fibrillation ([place]) 10/03/2016  . 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 PAD (peripheral artery disease) ([place]) 09/05/2016  . Groin pain 04/02/2015  . Aftercare following surgery of the circulatory system, [place] 12/13/2013  . Shingles 10/26/2013  . Anxiety 10/26/2013  . Acute blood loss anemia 09/05/2013  . Elbow fracture, left 09/05/2013  .  Left acetabular fracture ([place]) 09/05/2013  . Ankle fracture, left 09/05/2013  . HTN (hypertension) 09/03/2013  . HLD (hyperlipidemia) 09/03/2013  . Peripheral vascular disease, unspecified 05/10/2012  . Chronic total occlusion of artery of the extremities ([place]) 01/19/2012    Past Medical History:  Past Medical History:  Diagnosis Date  . Anemia   . Anginal pain ([place])   . Arthritis    "qwhere" (09/05/2016)  . Atrial fibrillation ([place]) 09/2016  . Chronic lower back pain   . Complication of anesthesia    "takes a long time for it to wear off; I can hallucinate if I take too much" (09/05/2016)  . DVT (deep venous thrombosis) ([place]) 10/2009  . Fall from steps 08/31/2013   Fx. pelvis, Left Hip, Left Elbow  . Fibromyalgia   . GERD (gastroesophageal reflux disease)    09/22/16- "no too much anymore"  . GI bleed 10/24/2016  . High cholesterol   . History of blood transfusion   . History of hiatal hernia   . Hypertension   . Macular degeneration, wet ([place])    "started in right eye; now legally blind in that eye; now started in left eye but pretty much in control" (09/05/2016)  . Osteoporosis   . Peripheral vascular disease ([place])    nonviable tissue Right foot  . PONV (postoperative nausea and vomiting)   . Squamous cell carcinoma of skin of right calf Aug. 2015  . Stroke [facility])    TIA's no residual  . TIA (  transient ischemic attack)    "several at once; none in a long time" (09/05/2016)   Past Surgical History:  Past Surgical History:  Procedure Laterality Date  . ABDOMINAL AORTAGRAM N/A 12/26/2014   Procedure: ABDOMINAL Maxcine Ham;  Surgeon: Serafina Mitchell, MD;  Location: [facility] CATH LAB;  Service: Cardiovascular;  Laterality: N/A;  . AMPUTATION Right 12/23/2016   Procedure: AMPUTATION BELOW KNEE;  Surgeon: Serafina Mitchell, MD;  Location: [place];  Service: Vascular;  Laterality: Right;  . AORTOGRAM N/A 09/26/2016   Procedure: AORTOGRAM;  Surgeon: Waynetta Sandy, MD;  Location:  [place];  Service: Vascular;  Laterality: N/A;  . CARPAL TUNNEL RELEASE Right   . CATARACT EXTRACTION W/ INTRAOCULAR LENS  IMPLANT, BILATERAL Bilateral   . COLONOSCOPY    . DILATION AND CURETTAGE OF UTERUS    . DRESSING CHANGE UNDER ANESTHESIA Right 01/16/2017   Procedure: DRESSING CHANGE RIGHT BELOW KNEE AMPUTATION;  Surgeon: Serafina Mitchell, MD;  Location: [place];  Service: Vascular;  Laterality: Right;  . EYE SURGERY Right    "macular OR"  . FEMORAL ARTERY STENT  12-11-10   Left SFA  . FEMORAL-POPLITEAL BYPASS GRAFT Left 09/24/2016   Procedure: REDO FEMORAL TO POPLITEAL ARTERY BYPASS GRAFT USING 6MM PROPATEN RINGED GORTEX GRAFT;  Surgeon: Serafina Mitchell, MD;  Location: [place];  Service: Vascular;  Laterality: Left;  . FEMORAL-TIBIAL BYPASS GRAFT Left 01/16/2017   Procedure: REDO BYPASS GRAFT FEMORAL-TIBIAL ARTERY WITH GORTEX GRAFT;  Surgeon: Serafina Mitchell, MD;  Location: [place];  Service: Vascular;  Laterality: Left;  AND LOWER LEG   . I&D EXTREMITY Right 12/17/2016   Procedure: IRRIGATION AND DEBRIDEMENT RIGHT FOOT;  Surgeon: Serafina Mitchell, MD;  Location: [place];  Service: Vascular;  Laterality: Right;  . INCISION AND DRAINAGE OF WOUND Left 10/25/2009   leg/notes 11/13/2009  . INSERTION OF ILIAC STENT Left 12/26/2014   Procedure: INSERTION OF ILIAC STENT;  Surgeon: Serafina Mitchell, MD;  Location: [facility] CATH LAB;  Service: Cardiovascular;  Laterality: Left;  . INSERTION OF ILIAC STENT Left 09/26/2016   Procedure: SUB INTIMAL INSERTION OF SUPERFICIAL FEMORAL ARTERY AND BELOW KNEE BYPASS GRAFT;  Surgeon: Waynetta Sandy, MD;  Location: [place];  Service: Vascular;  Laterality: Left;  . JOINT REPLACEMENT     knee  . JOINT REPLACEMENT Left Oct. 17, 2014   Elbow ( pt fell 08-31-13 )  . ORIF SHOULDER FRACTURE Right    "it was crushed"  . PERIPHERAL VASCULAR CATHETERIZATION N/A 10/30/2015   Procedure: Abdominal Aortogram w/Lower Extremity;  Surgeon: Serafina Mitchell, MD;  Location: [place] CV  LAB;  Service: Cardiovascular;  Laterality: N/A;  . PERIPHERAL VASCULAR CATHETERIZATION  10/30/2015   Procedure: Peripheral Vascular Intervention;  Surgeon: Serafina Mitchell, MD;  Location: [place] CV LAB;  Service: Cardiovascular;;  . PERIPHERAL VASCULAR CATHETERIZATION N/A 04/01/2016   Procedure: Abdominal Aortogram w/Lower Extremity;  Surgeon: Serafina Mitchell, MD;  Location: [place] CV LAB;  Service: Cardiovascular;  Laterality: N/A;  . PERIPHERAL VASCULAR CATHETERIZATION Left 04/01/2016   Procedure: Peripheral Vascular Atherectomy;  Surgeon: Serafina Mitchell, MD;  Location: [place] CV LAB;  Service: Cardiovascular;  Laterality: Left;  Superficial femoral artery.  Marland Kitchen PERIPHERAL VASCULAR CATHETERIZATION Right 09/05/2016   "stent"  . PERIPHERAL VASCULAR CATHETERIZATION N/A 09/05/2016   Procedure: Abdominal Aortogram w/Lower Extremity;  Surgeon: Serafina Mitchell, MD;  Location: [place] CV LAB;  Service: Cardiovascular;  Laterality: N/A;  . PERIPHERAL VASCULAR CATHETERIZATION  Right 09/05/2016   Procedure: Peripheral Vascular Intervention;  Surgeon: Serafina Mitchell, MD;  Location: [place] CV LAB;  Service: Cardiovascular;  Laterality: Right;  Superficial Femoral  . PERIPHERAL VASCULAR CATHETERIZATION Left 09/09/2016   Procedure: Lower Extremity Angiography;  Surgeon: Serafina Mitchell, MD;  Location: [place] CV LAB;  Service: Cardiovascular;  Laterality: Left;  . PR VEIN BYPASS GRAFT,AORTO-FEM-POP  09-13-09   Left Fem-pop  . THROMBECTOMY FEMORAL ARTERY Right 09/26/2016   Procedure: Thromboembolectomy Right Lower Extremity, Right Femoral Artery Endarterectomy with Patch Angioplasty; Right Lower Extremity Angiogram ;  Surgeon: Waynetta Sandy, MD;  Location: [place];  Service: Vascular;  Laterality: Right;  . TOTAL ELBOW ARTHROPLASTY Left 09/03/2013   Procedure: LEFT TOTAL ELBOW ARTHROPLASTY;  Surgeon: Roseanne Kaufman, MD;  Location: [place];  Service: Orthopedics;  Laterality:  Left;  . TOTAL KNEE ARTHROPLASTY Left 06/2006  . TRANSMETATARSAL AMPUTATION Right 12/11/2016   Procedure: TRANSMETATARSAL AMPUTATION;  Surgeon: Serafina Mitchell, MD;  Location: [place];  Service: Vascular;  Laterality: Right;  . TUBAL LIGATION    . VAGINAL HYSTERECTOMY      Assessment & Plan Clinical Impression: LINDE WILENSKY a 83 y.o.right hand femalewith history of right elbow replacement, atrial fibrillation maintained on Plavix, GI bleed, chronic low back pain with fibromyalgia, chronic kidney disease stage III, hypertension, peripheral vascular disease with femoral-popliteal bypass grafting 09/24/2016 as well as 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 History taken from chart review and daughter. She was able to complete sliding board transfers with minimal assist and transferred mat to wheelchair using sliding board squat pivot technique. She lives with her spouse and family with assistance as needed. Presented back 01/13/2017 with left leg numbness and increasing pain. Duplex study ultrasound showed occlusion of left leg bypass site. Underwent left profunda to anterior tibial bypass with redo femoral artery exposure 01/16/2017 per [name]. [facility] course pain management and severe constipation. Acute on chronic anemia 7.2-8.3 and monitored. Subcutaneous Lovenox for DVT prophylaxis. Patient transferred to CIR on 01/21/2017.   Patient currently requires max with mobility secondary to muscle weakness, decreased cardiorespiratoy endurance and decreased sitting balance, decreased standing balance, decreased postural control and decreased balance strategies.  Prior to hospitalization, patient was supervision wheelchair level with mobility and lived with Spouse in a House home.  Home access is  Ramped entrance.  Patient will benefit from skilled PT intervention to maximize safe functional mobility, minimize  fall risk and decrease caregiver burden for planned discharge home with 24 hour assist.  Anticipate patient will benefit from follow up [facility] at discharge.  PT - End of Session Activity Tolerance: Tolerates 10 - 20 min activity with multiple rests Endurance Deficit: Yes Endurance Deficit Description: fatigues easily, pain limiting PT Assessment Rehab Potential (ACUTE/IP ONLY): Fair PT Patient demonstrates impairments in the following area(s): Balance;Edema;Endurance;Motor;Pain;Safety;Sensory;Skin Integrity PT Transfers Functional Problem(s): Bed Mobility;Bed to Chair;Car PT Locomotion Functional Problem(s): Wheelchair Mobility PT Plan PT Intensity: Minimum of 1-2 x/day ,45 to 90 minutes PT Frequency: 5 out of 7 days PT Duration Estimated Length of Stay: 10-12 days PT Treatment/Interventions: Balance/vestibular training;Community reintegration;Discharge planning;Disease management/prevention;DME/adaptive equipment instruction;Functional mobility training;Neuromuscular re-education;Pain management;Patient/family education;Psychosocial support;Skin care/wound management;Therapeutic Activities;Therapeutic Exercise;UE/LE Strength taining/ROM;UE/LE Coordination activities;Wheelchair propulsion/positioning PT Transfers Anticipated Outcome(s): min A PT Locomotion Anticipated Outcome(s): supervision wheelchair level PT Recommendation Recommendations for Other Services: Therapeutic Recreation consult Therapeutic Recreation Interventions: Kitchen group;Pet therapy Follow Up Recommendations: Home health PT;24 hour supervision/assistance Patient  destination: Home Equipment Recommended: None recommended by PT Equipment Details: pt has needed DME  Skilled Therapeutic Intervention Treatment 1: Skilled therapeutic intervention initiated after completion of evaluation. Discussed with patient falls risk, safety within room, focus of therapy during stay, possible length of stay, goals, and follow-up therapy.  Patient familiar to rehab following recent stay. Extensive time spent retrieving appropriate wheelchair size, fitting patient on rojo cushion for pressure relief and improved sitting tolerance, and adjusting leg rest and R amputee support pad. Patient required max-total A for slide board transfer to wheelchair with decreased ability to bear weight through LLE due to pain and inability to perform anterior weight shift resulting in increased shearing. Patient propelled wheelchair in straight path 100 ft with supervision and increased time before requesting to terminate task due to pain and fatigue. Patient requested to return to bed due to LLE pain at end of session, left semi reclined with all needs within reach.   Treatment 2: Patient in bed with c/o LLE pain with family at bedside. Discussed bringing patient's personal wheelchair in to practice with, daughter reports having wheelchair in car and retrieved WC during session. Patient instructed in BLE therex for strengthening and ROM in supported long sitting: L ankle pumps with assist to achieve full eversion ROM, spelling alphabet with L ankle, B quad sets with 5 sec hold, LLE heel slides, B SLR with assist for LLE. Patient reported need to toilet. Transferred to EOB using rail with supervision and increased time and performed slide board transfer to R to drop arm BSC with increase time, mod-max A with verbal cues for head hips relationship and R hand placement with therapist stabilizing L ankle due to instability. Discussed need for more stable shoe for L foot as patient prefers bedroom slipper however pt and family report she is unable to tolerate "regular" shoes due to vascular issues. Patient able to perform lateral leans to doff pants for toileting on BSC. Patient left sitting on [facility] with daughter present, nursing notified.   PT Evaluation Precautions/Restrictions Precautions Precautions: Fall Precaution Comments: pt not to push or pull with Lt elbow  more than 5 lbs (s/p total elbow replacement) , R BKA, LLE painful Restrictions Weight Bearing Restrictions: Yes RLE Weight Bearing: Non weight bearing General Chart Reviewed: Yes Family/Caregiver Present: No  Pain Pain Assessment Pain Score: 8  Pain Type: Acute pain Pain Location: Leg Pain Orientation: Left Pain Descriptors / Indicators: Aching;Discomfort Pain Intervention(s): Repositioned, rest Home Living/Prior Functioning Home Living Living Arrangements: Spouse/significant other Available Help at Discharge: Family;Available 24 hours/day Type of Home: House Home Access: Ramped entrance Home Layout: One level Bathroom Shower/Tub: Walk-in shower;Other (comment);Door (has 2 BSC - one in shower and one over toilet) Bathroom Toilet: Standard Bathroom Accessibility: Yes Additional Comments: Sleeps in recliner at home  Lives With: Spouse Prior Function Level of Independence: Needs assistance with ADLs;Needs assistance with tranfers  Able to Take Stairs?: No Driving: No Vocation: Retired Vision/Perception   No change from baseline  Cognition Orientation Level: Oriented X4 Sensation Sensation Light Touch: Impaired Detail Light Touch Impaired Details: Impaired LLE Stereognosis: Appears Intact Hot/Cold: Appears Intact Proprioception: Appears Intact Coordination Gross Motor Movements are Fluid and Coordinated: No Fine Motor Movements are Fluid and Coordinated: No Motor  Motor Motor - Skilled Clinical Observations: generalized muscle weakness  Mobility Bed Mobility Bed Mobility: Supine to Sit Supine to Sit: 5: Supervision;With rails;HOB elevated Transfers Transfers: Yes Lateral/Scoot Transfers: With slide board;2: Max assist Locomotion  Ambulation Ambulation: No Gait Gait:  No Stairs / Additional Locomotion Stairs: No Architect: Yes Wheelchair Assistance: 5: Careers information officer: Both upper extremities Wheelchair Parts  Management: Needs assistance Distance: 100 ft  Trunk/Postural Assessment  Cervical Assessment Cervical Assessment: Within Functional Limits Thoracic Assessment Thoracic Assessment: Within Functional Limits Lumbar Assessment Lumbar Assessment: Exceptions to [facility] (Posterior pelvic tilt) Postural Control Postural Control: Within Functional Limits  Extremity Assessment  RLE Assessment RLE Assessment: Within Functional Limits RLE Strength RLE Overall Strength: Deficits RLE Overall Strength Comments: grossly 4/5 LLE Assessment LLE Assessment: Exceptions to [facility] LLE Strength LLE Overall Strength: Due to pain;Deficits LLE Overall Strength Comments: pain inhibited, grossly 3- to 4/5   See Function Navigator for Current Functional Status.   Refer to Care Plan for Long Term Goals  Recommendations for other services: Therapeutic Recreation  Pet therapy and Kitchen group  Discharge Criteria: Patient will be discharged from PT if patient refuses treatment 3 consecutive times without medical reason, if treatment goals not met, if there is a change in medical status, if patient makes no progress towards goals or if patient is discharged from hospital.  The above assessment, treatment plan, treatment alternatives and goals were discussed and mutually agreed upon: by patient  Glorious Flicker, [name] 01/22/2017, 1:01 PM

## 2017-01-22 NOTE — Progress Notes (Signed)
Patient information reviewed and entered into eRehab system by Harsimran Westman, RN, CRRN, PPS Coordinator.  Information including medical coding and functional independence measure will be reviewed and updated through discharge.     Per nursing patient was given "Data Collection Information Summary for Patients in Inpatient Rehabilitation Facilities with attached "Privacy Act Statement-Health Care Records" upon admission.  

## 2017-01-22 NOTE — Progress Notes (Signed)
Social Work Assessment and Plan Social Work Assessment and Plan  Patient Details  Name: [name] MRN: [id number] Date of Birth: [birth year]  Today's Date: 01/22/2017  Problem List:  Patient Active Problem List   Diagnosis Date Noted  . Decubitus ulcer of sacral region, unstageable ([place])   . Deep tissue injury   . Hypokalemia   . Hypoalbuminemia due to protein-calorie malnutrition ([place])   . Debilitated 01/21/2017  . S/P BKA (below knee amputation) unilateral, right ([place])   . Neuropathic pain   . Post-operative pain   . Slow transit constipation   . Debility   . Ischemic leg   . Hx of right BKA ([place])   . PAF (paroxysmal atrial fibrillation) ([place])   . Anemia of chronic disease   . Chronic pain syndrome   . Fibromyalgia   . Stage 3 chronic kidney disease   . Benign essential HTN   . Abnormal urinalysis   . PVD (peripheral vascular disease) ([place]) 01/13/2017  . Post-op pain 01/03/2017  . Phantom limb pain ([place]) 01/03/2017  . Status post below knee amputation of right lower extremity ([place]) 12/30/2016  . Acute on chronic combined systolic and diastolic CHF (congestive heart failure) ([place]) 12/22/2016  . Anemia 12/22/2016  . Palliative care encounter   . Lower extremity pain, right   . Acute GI bleeding 10/23/2016  . Paroxysmal atrial fibrillation ([place]) 10/03/2016  . 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 PAD (peripheral artery disease) ([place]) 09/05/2016  . Groin pain 04/02/2015  . Aftercare following surgery of the circulatory system, [place] 12/13/2013  . Shingles 10/26/2013  . Anxiety 10/26/2013  . Acute blood loss anemia 09/05/2013  . Elbow fracture, left 09/05/2013  . Left acetabular fracture ([place]) 09/05/2013  . Ankle fracture, left 09/05/2013  . HTN (hypertension) 09/03/2013  . HLD (hyperlipidemia) 09/03/2013  . Peripheral vascular disease, unspecified 05/10/2012  . Chronic total occlusion of artery  of the extremities ([place]) 01/19/2012   Past Medical History:  Past Medical History:  Diagnosis Date  . Anemia   . Anginal pain ([place])   . Arthritis    "qwhere" (09/05/2016)  . Atrial fibrillation ([place]) 09/2016  . Chronic lower back pain   . Complication of anesthesia    "takes a long time for it to wear off; I can hallucinate if I take too much" (09/05/2016)  . DVT (deep venous thrombosis) ([place]) 10/2009  . Fall from steps 08/31/2013   Fx. pelvis, Left Hip, Left Elbow  . Fibromyalgia   . GERD (gastroesophageal reflux disease)    09/22/16- "no too much anymore"  . GI bleed 10/24/2016  . High cholesterol   . History of blood transfusion   . History of hiatal hernia   . Hypertension   . Macular degeneration, wet ([place])    "started in right eye; now legally blind in that eye; now started in left eye but pretty much in control" (09/05/2016)  . Osteoporosis   . Peripheral vascular disease ([place])    nonviable tissue Right foot  . PONV (postoperative nausea and vomiting)   . Squamous cell carcinoma of skin of right calf Aug. 2015  . Stroke ([place])    TIA's no residual  . TIA (transient ischemic attack)    "several at once; none in a long time" (09/05/2016)   Past Surgical History:  Past Surgical History:  Procedure Laterality Date  . ABDOMINAL AORTAGRAM N/A 12/26/2014   Procedure: ABDOMINAL  Maxcine Ham;  Surgeon: Serafina Mitchell, MD;  Location: [facility] CATH LAB;  Service: Cardiovascular;  Laterality: N/A;  . AMPUTATION Right 12/23/2016   Procedure: AMPUTATION BELOW KNEE;  Surgeon: Serafina Mitchell, MD;  Location: [place];  Service: Vascular;  Laterality: Right;  . AORTOGRAM N/A 09/26/2016   Procedure: AORTOGRAM;  Surgeon: Waynetta Sandy, MD;  Location: [place];  Service: Vascular;  Laterality: N/A;  . CARPAL TUNNEL RELEASE Right   . CATARACT EXTRACTION W/ INTRAOCULAR LENS  IMPLANT, BILATERAL Bilateral   . COLONOSCOPY    . DILATION AND CURETTAGE OF UTERUS    . DRESSING CHANGE UNDER  ANESTHESIA Right 01/16/2017   Procedure: DRESSING CHANGE RIGHT BELOW KNEE AMPUTATION;  Surgeon: Serafina Mitchell, MD;  Location: [place];  Service: Vascular;  Laterality: Right;  . EYE SURGERY Right    "macular OR"  . FEMORAL ARTERY STENT  12-11-10   Left SFA  . FEMORAL-POPLITEAL BYPASS GRAFT Left 09/24/2016   Procedure: REDO FEMORAL TO POPLITEAL ARTERY BYPASS GRAFT USING 6MM PROPATEN RINGED GORTEX GRAFT;  Surgeon: Serafina Mitchell, MD;  Location: [place];  Service: Vascular;  Laterality: Left;  . FEMORAL-TIBIAL BYPASS GRAFT Left 01/16/2017   Procedure: REDO BYPASS GRAFT FEMORAL-TIBIAL ARTERY WITH GORTEX GRAFT;  Surgeon: Serafina Mitchell, MD;  Location: [place];  Service: Vascular;  Laterality: Left;  AND LOWER LEG   . I&D EXTREMITY Right 12/17/2016   Procedure: IRRIGATION AND DEBRIDEMENT RIGHT FOOT;  Surgeon: Serafina Mitchell, MD;  Location: [place];  Service: Vascular;  Laterality: Right;  . INCISION AND DRAINAGE OF WOUND Left 10/25/2009   leg/notes 11/13/2009  . INSERTION OF ILIAC STENT Left 12/26/2014   Procedure: INSERTION OF ILIAC STENT;  Surgeon: Serafina Mitchell, MD;  Location[facility] CATH LAB;  Service: Cardiovascular;  Laterality: Left;  . INSERTION OF ILIAC STENT Left 09/26/2016   Procedure: SUB INTIMAL INSERTION OF SUPERFICIAL FEMORAL ARTERY AND BELOW KNEE BYPASS GRAFT;  Surgeon: Waynetta Sandy, MD;  Location: [place];  Service: Vascular;  Laterality: Left;  . JOINT REPLACEMENT     knee  . JOINT REPLACEMENT Left Oct. 17, 2014   Elbow ( pt fell 08-31-13 )  . ORIF SHOULDER FRACTURE Right    "it was crushed"  . PERIPHERAL VASCULAR CATHETERIZATION N/A 10/30/2015   Procedure: Abdominal Aortogram w/Lower Extremity;  Surgeon: Serafina Mitchell, MD;  Location: [place] CV LAB;  Service: Cardiovascular;  Laterality: N/A;  . PERIPHERAL VASCULAR CATHETERIZATION  10/30/2015   Procedure: Peripheral Vascular Intervention;  Surgeon: Serafina Mitchell, MD;  Location: [place] CV LAB;  Service:  Cardiovascular;;  . PERIPHERAL VASCULAR CATHETERIZATION N/A 04/01/2016   Procedure: Abdominal Aortogram w/Lower Extremity;  Surgeon: Serafina Mitchell, MD;  Location: [place] CV LAB;  Service: Cardiovascular;  Laterality: N/A;  . PERIPHERAL VASCULAR CATHETERIZATION Left 04/01/2016   Procedure: Peripheral Vascular Atherectomy;  Surgeon: Serafina Mitchell, MD;  Location: [place] CV LAB;  Service: Cardiovascular;  Laterality: Left;  Superficial femoral artery.  Marland Kitchen PERIPHERAL VASCULAR CATHETERIZATION Right 09/05/2016   "stent"  . PERIPHERAL VASCULAR CATHETERIZATION N/A 09/05/2016   Procedure: Abdominal Aortogram w/Lower Extremity;  Surgeon: Serafina Mitchell, MD;  Location: [place] CV LAB;  Service: Cardiovascular;  Laterality: N/A;  . PERIPHERAL VASCULAR CATHETERIZATION Right 09/05/2016   Procedure: Peripheral Vascular Intervention;  Surgeon: Serafina Mitchell, MD;  Location: [place] CV LAB;  Service: Cardiovascular;  Laterality: Right;  Superficial Femoral  . PERIPHERAL VASCULAR CATHETERIZATION Left 09/09/2016  Procedure: Lower Extremity Angiography;  Surgeon: Serafina Mitchell, MD;  Location: [place] CV LAB;  Service: Cardiovascular;  Laterality: Left;  . PR VEIN BYPASS GRAFT,AORTO-FEM-POP  09-13-09   Left Fem-pop  . THROMBECTOMY FEMORAL ARTERY Right 09/26/2016   Procedure: Thromboembolectomy Right Lower Extremity, Right Femoral Artery Endarterectomy with Patch Angioplasty; Right Lower Extremity Angiogram ;  Surgeon: Waynetta Sandy, MD;  Location: [place];  Service: Vascular;  Laterality: Right;  . TOTAL ELBOW ARTHROPLASTY Left 09/03/2013   Procedure: LEFT TOTAL ELBOW ARTHROPLASTY;  Surgeon: Roseanne Kaufman, MD;  Location: [place];  Service: Orthopedics;  Laterality: Left;  . TOTAL KNEE ARTHROPLASTY Left 06/2006  . TRANSMETATARSAL AMPUTATION Right 12/11/2016   Procedure: TRANSMETATARSAL AMPUTATION;  Surgeon: Serafina Mitchell, MD;  Location: [place];  Service: Vascular;  Laterality:  Right;  . TUBAL LIGATION    . VAGINAL HYSTERECTOMY     Social History:  reports that she quit smoking about 70 years ago. Her smoking use included Cigarettes. She has never used smokeless tobacco. She reports that she does not drink alcohol or use drugs.  Family / Support Systems Marital Status: Married How Long?: 6 years Patient Roles: Spouse, Parent Spouse/Significant Other: Gwyndolyn Saxon [phone] Children: Cindy-daughter [phone]  [place]ora all live locally Other Supports: friends and church members Anticipated Caregiver: Husband, daughter and son Ability/Limitations of Caregiver: Husband can provide supervision level only, but daughter and son's are in and out Caregiver Availability: 24/7 Family Dynamics: Very close family who all assist one another, children have been very involved and supportive and assist with pt's care at home. Pt really wants to get home and stay there for longer than 4 hours  Social History Preferred language: English Religion: Baptist Cultural Background: No issues Education: [employer] Read: Yes Write: Yes Employment Status: Retired Freight forwarder Issues: no issues Guardian/Conservator: none-according to MD pt is capable of making her decisions while here. Will make sure daughter is involved since she is the main person in the family.   Abuse/Neglect Physical Abuse: Denies Verbal Abuse: Denies Sexual Abuse: Denies Exploitation of patient/patient's resources: Denies Self-Neglect: Denies  Emotional Status Pt's affect, behavior adn adjustment status: Pt is somewhat discouraged by haivng to come back into the hospital due to leg issues and stents not working. She has now had fem-pop and is doing better but has declined in function and needed to come back here. She is taking each day at a time and knows she will get home agian. Recent Psychosocial Issues: Mutliple health issues and surgeries since last Nov. Pyschiatric  History: No issues deferred depression screen at this time due to feels doing ok and not as discouraged as last time when here. She will let this worker know if she feels she is becoming depressed like last time. But really feels it was the whole losing her leg loss and how she was feeling and the pian issues she had. Substance Abuse History: No issues  Patient / Family Perceptions, Expectations & Goals Pt/Family understanding of illness & functional limitations: Pt and family have good understanding of her condition and treatment plan from this day forward. She does talk with the MD and feels she is being heard. She hopes it will be short term here and be home soon. Premorbid pt/family roles/activities: Wife, Mother, grandmother, retiree, church member, etc Anticipated changes in roles/activities/participation: resume Pt/family expectations/goals: Pt states: " I want to get back to where I was and get home to stay."  Daughter states: " I  hope she gets stronger and back to where she was before coming back into the hospital."  [employer]: Other (Comment) Premorbid Home Care/DME Agencies: Other (Comment) (Active with [facility] before coming in) Transportation available at discharge: family Resource referrals recommended: Support group (specify)  Discharge Planning Living Arrangements: Spouse/significant other Support Systems: Spouse/significant other, Children, Friends/neighbors, Church/faith community Type of Residence: Private residence Insurance Resources: [employer], Multimedia programmer (specify) Nurse, mental health) Financial Resources: [place], Family Support Financial Screen Referred: No Living Expenses: Own Money Management: Family Does the patient have any problems obtaining your medications?: No Home Management: Family members Patient/Family Preliminary Plans: Return home with husband and children to assist like they were prior to admission. Pt was just here in the  hospital 2/27 and family was trained in her care prior to discharge. HH and DME arranged and will resume upon discharge from rehab Social Work Anticipated Follow Up Needs: HH/OP, Support Group  Clinical Impression Pleasant elderly lady who is here again and wanting to get home as quickly as possible but also wants to stay longer than 4 hours. She realizes she is weaker and needs more therapies to get back to her prior Functioning level. Her family was assisting with her care prior to admission and will once she is discharged from [place]. Will await team's evaluations and work on any discharge needs. Will provide support to pt while here.  [name] 01/22/2017, 12:23 PM

## 2017-01-22 NOTE — Progress Notes (Signed)
[facility] PHYSICAL MEDICINE & REHABILITATION     PROGRESS NOTE  Subjective/Complaints:  Pt seen laying in bed this Am.  She slept well overnight.  Evaluated with nursing.   ROS: Denies CP, SOB, N/V/D.  Objective: Vital Signs: Blood pressure (!) 161/64, pulse 76, temperature 98.2 F (36.8 C), temperature source Oral, resp. rate 18, weight 53.8 kg (118 lb 11.2 oz), SpO2 98 %. No results found.  Recent Labs  01/21/17 1941 01/22/17 0627  WBC 8.6 6.9  HGB 8.2* 8.2*  HCT 25.8* 25.5*  PLT 287 285    Recent Labs  01/21/17 1941 01/22/17 0627  NA  --  136  K  --  3.4*  CL  --  104  GLUCOSE  --  100*  BUN  --  10  CREATININE 0.81 0.82  CALCIUM  --  9.1   CBG (last 3)  No results for input(s): GLUCAP in the last 72 hours.  Wt Readings from Last 3 Encounters:  01/21/17 53.8 kg (118 lb 11.2 oz)  01/21/17 54.1 kg (119 lb 4.3 oz)  01/13/17 51.3 kg (113 lb)    Physical Exam:  BP (!) 161/64 (BP Location: Left Arm)   Pulse 76   Temp 98.2 F (36.8 C) (Oral)   Resp 18   Wt 53.8 kg (118 lb 11.2 oz)   SpO2 98%   BMI 21.71 kg/m  Constitutional: She appears well-developed. Frail  HENT: Normocephalic and atraumatic.  Eyes: EOMI. No discharge.  Cardiovascular: Normal rate and regular rhythm.  No JVD. Respiratory: Effort normal and breath sounds normal.  GI: Soft. Bowel sounds are normal.  Musculoskeletal: She exhibits edema and tenderness.  Neurological: She is alert and oriented.  Motor: B/l UE 4/5 RLE: HF 4+/5 LLE: 4/5 (pain inhibition)  Skin. Right BKA site with ischemic margins and boggy stump.   Left lower extremity bypass site clean and dry appropriately tender.  Unstagable sacral ulcer DTI left heel Psychiatric: She has a normal mood and affect. Her behavior is normal.   Assessment/Plan: 1. Functional deficits secondary to occluded left leg bypass status post redo femoral artery exposure 01/16/2017 as well as right BKA 12/23/2016 which require 3+ hours per day  of interdisciplinary therapy in a comprehensive inpatient rehab setting. Physiatrist is providing close team supervision and 24 hour management of active medical problems listed below. Physiatrist and rehab team continue to assess barriers to discharge/monitor patient progress toward functional and medical goals.  Function:  Bathing Bathing position      Bathing parts      Bathing assist        Upper Body Dressing/Undressing Upper body dressing                    Upper body assist        Lower Body Dressing/Undressing Lower body dressing                                  Lower body assist        Toileting Toileting          Toileting assist     Transfers Chair/bed transfer             Locomotion Ambulation           Wheelchair          Cognition Comprehension    Expression    Social Interaction    Problem  Solving    Memory       Medical Problem List and Plan: 1.  Decreased functional mobility secondary to occluded left leg bypass status post redo femoral artery exposure 01/16/2017 as well as right BKA 12/23/2016  Begin CIR 2.  DVT Prophylaxis/Anticoagulation: Subcutaneous Lovenox. Monitor platelet counts and any signs of bleeding 3. Pain Management: Neurontin 300 mg daily, Duragesic patch 12.5 g, Robaxin and oxycodone as needed 4. Mood: Valium 2.5 mg daily at bedtime as needed 5. Neuropsych: This patient is capable of making decisions on her own behalf. 6. Skin/Wound Care: Routine skin checks  Santyl to unstagable sacral ulcer  Prevalon boot to DTI  Shrinker for boggy stump 7. Fluids/Electrolytes/Nutrition: Routine I&Os 8. Acute on chronic anemia.   Hb 8.2 on 3/8  Cont to monitor 9. Atrial fibrillation. Cardiac rate controlled. Continue Plavix 10. Hypertension. Lopressor 25 mg daily, Avapro 75 mg daily, HCTZ 6.25 mg daily.   Monitor with increased mobility 11. ?CKD. Creatinine 1.02 baseline.   Cr 0.82 on 3/8 12.  History of GI bleed. Continue Protonix. 13. Constipation. Laxative assistance 14. Hypokalemia  K 3.4 on 3/8  Supplement initiated 3/8  Cont to monitor 15. Hypoalbuminemia  Supplement initiated 3/8  LOS (Days) 1 A FACE TO FACE EVALUATION WAS PERFORMED  Kamaya Keckler Lorie Phenix 01/22/2017 9:01 AM

## 2017-01-23 ENCOUNTER — Encounter (HOSPITAL_COMMUNITY): Payer: Self-pay | Admitting: *Deleted

## 2017-01-23 ENCOUNTER — Inpatient Hospital Stay (HOSPITAL_COMMUNITY): Payer: Medicare Other | Admitting: Occupational Therapy

## 2017-01-23 ENCOUNTER — Inpatient Hospital Stay (HOSPITAL_COMMUNITY): Payer: Medicare Other | Admitting: Physical Therapy

## 2017-01-23 DIAGNOSIS — I10 Essential (primary) hypertension: Secondary | ICD-10-CM

## 2017-01-23 NOTE — Progress Notes (Signed)
Orthopedic Tech Progress Note Patient Details:  [name] 1928/10/06 [id number]  Ortho Devices Type of Ortho Device: Ankle splint Ortho Device/Splint Location: lle Ortho Device/Splint Interventions: Application   [name] 01/23/2017, 10:50 AM

## 2017-01-23 NOTE — Progress Notes (Signed)
Occupational Therapy Session Note  Patient Details  Name: [name] MRN: [id number] Date of Birth: [birth year]  Today's Date: 01/23/2017   Session 1 OT Individual Time: 0802-0900 OT Individual Time Calculation (min): 58 min   Session 2 OT Individual Time: 8485-9276 OT Individual Time Calculation (min): 44 min    Short Term Goals: Week 1:  OT Short Term Goal 1 (Week 1): LTG=STG 2/2 estimated short LOS  Skilled Therapeutic Interventions/Progress Updates:  Session 1   1:1 OT session focused on modified bathing/dressing, BSC transfers, and toileting techniques. UB bathing/dressing at EOB with set-up A and supervision. Pt able to maintain sitting balance with stand-by assist. Slide-board transfer > BSC with Max A + L ankle support.  Max A to doff brief, then set-up for peri-care.  Pt completed LB bathing in BSC with min A to wash L Le. Pt able to thread B LE's into pants, but unable to pull pants up leaning L>R on BSC. Max A lateral transfer back to bed to pull up pants in supine via rolling technique with max A. Pt fatigued and left semi-reclined in bed with needs met and family present.   Session 2 1:1 OT focused on transfer training, LB strengthening, and activity tolerance. Educated pt and daughter on donning/dofffing air cast to LLE for ankle support. Discussed importance of skin checks and wearing cast only for transfers. Much improved ankle stability throughout session. Slide-board and lateral transfers bed<>wc<>tub bench. Overall min A provided with verbal cues for technique and weight shifting. 3 partial sit<>stands from wc with focus on anterior weight shifting and L LE strengthening. Pt returned to bed at end of session and left with needs met.  Therapy Documentation Precautions:  Precautions Precautions: Fall Precaution Comments: pt not to push or pull with Lt elbow more than 5 lbs (s/p total elbow replacement) , R BKA, LLE painful Restrictions Weight Bearing Restrictions:  Yes LUE Weight Bearing: Partial weight bearing LUE Partial Weight Bearing Percentage or Pounds: no more than 5 lbs 2/2 old total elbow replacement RLE Weight Bearing: Non weight bearing LLE Weight Bearing: Weight bearing as tolerated Pain:  denies pain ADL: ADL ADL Comments: Please see functional navigator  See Function Navigator for Current Functional Status.   Therapy/Group: Individual Therapy  Valma Cava 01/23/2017, 3:56 PM

## 2017-01-23 NOTE — Progress Notes (Signed)
Physical Therapy Session Note  Patient Details  Name: [name] MRN: [id number] Date of Birth: 1927/11/19  Today's Date: 01/23/2017 PT Individual Time: 0935-1100 PT Individual Time Calculation (min): 85 min   Short Term Goals: Week 1:  PT Short Term Goal 1 (Week 1): Patient will transfer bed <> wheelchair with mod A.  PT Short Term Goal 2 (Week 1): Patient will perform bed mobility on regular bed with min A.  PT Short Term Goal 3 (Week 1): Patient will initiate car transfer training.   Skilled Therapeutic Interventions/Progress Updates:   Patient in bed upon arrival with shrinker donned. Performed rolling in bed using rails with min A for nursing to dress sacral wound and nurse applied foam dressing to L heel. Patient sat EOB with supervision using rail and increased time to scoot to edge of bed. Patient required max verbal/visual cues to raise/lower arm rest on wheelchair. Patient performed slide board transfers from bed to and from wheelchair with total A to place board and min A overall to stabilize L ankle to prevent rolling during transfer. Discussed ankle instability with PA-C [name] who ordered L air cast and it was fitted at end of session, patient and daughter educated on purpose of brace and verbalized understanding. Patient propelled wheelchair throughout rehab unit up to 150 ft with supervision and increased time and required assistance to manage bilateral leg rests. Simulated car transfer to sedan height using slide board with assist to stabilize L ankle and lift LLE in/out of car with mod A overall. Patient instructed in seated/supine BLE therex for strengthening and ROM, each exercise to fatigue. Discussed discharge planning with patient and daughter and f/u HHPT. Patient left semi reclined in bed with all needs in reach and daughter at bedside.   Therapy Documentation Precautions:  Precautions Precautions: Fall Precaution Comments: pt not to push or pull with Lt elbow  more than 5 lbs (s/p total elbow replacement) , R BKA, LLE painful Restrictions Weight Bearing Restrictions: Yes LUE Weight Bearing: Partial weight bearing LUE Partial Weight Bearing Percentage or Pounds: no more than 5 lbs 2/2 old total elbow replacement RLE Weight Bearing: Non weight bearing LLE Weight Bearing: Weight bearing as tolerated Pain:  Unrated LLE pain with activity, rest and repositioned  See Function Navigator for Current Functional Status.   Therapy/Group: Individual Therapy  Maley Venezia, [name] 01/23/2017, 11:49 AM

## 2017-01-23 NOTE — Progress Notes (Signed)
[place] PHYSICAL MEDICINE & REHABILITATION     PROGRESS NOTE  Subjective/Complaints:  Pt seen laying in bed. Family at bedside.  Pt slept well overnight.  She has her shrinker in place.  She states her pravalon boot was too tight, so she loosened it and now her foot is out of the boot.   ROS: Denies CP, SOB, N/V/D.  Objective: Vital Signs: Blood pressure 135/64, pulse 79, temperature 98.2 F (36.8 C), temperature source Oral, resp. rate 12, weight 53.8 kg (118 lb 11.2 oz), SpO2 99 %. No results found.  Recent Labs  01/21/17 1941 01/22/17 0627  WBC 8.6 6.9  HGB 8.2* 8.2*  HCT 25.8* 25.5*  PLT 287 285    Recent Labs  01/21/17 1941 01/22/17 0627  NA  --  136  K  --  3.4*  CL  --  104  GLUCOSE  --  100*  BUN  --  10  CREATININE 0.81 0.82  CALCIUM  --  9.1   CBG (last 3)  No results for input(s): GLUCAP in the last 72 hours.  Wt Readings from Last 3 Encounters:  01/21/17 53.8 kg (118 lb 11.2 oz)  01/21/17 54.1 kg (119 lb 4.3 oz)  01/13/17 51.3 kg (113 lb)    Physical Exam:  BP 135/64   Pulse 79   Temp 98.2 F (36.8 C) (Oral)   Resp 12   Wt 53.8 kg (118 lb 11.2 oz)   SpO2 99%   BMI 21.71 kg/m  Constitutional: She appears well-developed. Frail  HENT: Normocephalic and atraumatic.  Eyes: EOMI. No discharge.  Cardiovascular: RRR.  No JVD. Respiratory: Effort normal and breath sounds normal.  GI: Soft. Bowel sounds are normal.  Musculoskeletal: She exhibits edema and tenderness.  Neurological: She is alert and oriented.  Motor: B/l UE 4/5 RLE: HF 4+/5 LLE: 4/5 (stable)  Skin. Right BKA site with dried blood and bulbous stump.   Left lower extremity bypass site clean and dry.  Unstagable sacral ulcer (not examined today) DTI left heel (not examined today) Psychiatric: She has a normal mood and affect. Her behavior is normal.   Assessment/Plan: 1. Functional deficits secondary to occluded left leg bypass status post redo femoral artery exposure  01/16/2017 as well as right BKA 12/23/2016 which require 3+ hours per day of interdisciplinary therapy in a comprehensive inpatient rehab setting. Physiatrist is providing close team supervision and 24 hour management of active medical problems listed below. Physiatrist and rehab team continue to assess barriers to discharge/monitor patient progress toward functional and medical goals.  Function:  Bathing Bathing position   Position: Sitting EOB  Bathing parts Body parts bathed by patient: Right arm, Left arm, Chest, Abdomen, Right upper leg, Left upper leg Body parts bathed by helper: Buttocks, Back, Left upper leg  Bathing assist Assist Level: Touching or steadying assistance(Pt > 75%)      Upper Body Dressing/Undressing Upper body dressing   What is the patient wearing?: Pull over shirt/dress     Pull over shirt/dress - Perfomed by patient: Thread/unthread right sleeve, Thread/unthread left sleeve, Put head through opening Pull over shirt/dress - Perfomed by helper: Pull shirt over trunk        Upper body assist Assist Level: Touching or steadying assistance(Pt > 75%), Set up   Set up : To obtain clothing/put away  Lower Body Dressing/Undressing Lower body dressing   What is the patient wearing?: Shoes  Shoes - Performed by helper: Don/doff left shoe          Lower body assist Assist for lower body dressing: Touching or steadying assistance (Pt > 75%)      Toileting Toileting     Toileting steps completed by helper: Adjust clothing prior to toileting    Toileting assist Assist level: Touching or steadying assistance (Pt.75%)   Transfers Chair/bed transfer   Chair/bed transfer method: Lateral scoot Chair/bed transfer assist level: Maximal assist (Pt 25 - 49%/lift and lower) Chair/bed transfer assistive device: Sliding board, Armrests     Locomotion Ambulation Ambulation activity did not occur: Safety/medical concerns          Wheelchair   Type: Manual Max wheelchair distance: 75 ft Assist Level: Supervision or verbal cues  Cognition Comprehension Comprehension assist level: Follows complex conversation/direction with extra time/assistive device  Expression Expression assist level: Expresses complex ideas: With extra time/assistive device  Social Interaction Social Interaction assist level: Interacts appropriately with others with medication or extra time (anti-anxiety, antidepressant).  Problem Solving Problem solving assist level: Solves complex 90% of the time/cues < 10% of the time  Memory Memory assist level: Recognizes or recalls 90% of the time/requires cueing < 10% of the time     Medical Problem List and Plan: 1.  Decreased functional mobility secondary to occluded left leg bypass status post redo femoral artery exposure 01/16/2017 as well as right BKA 12/23/2016  Cont CIR 2.  DVT Prophylaxis/Anticoagulation: Subcutaneous Lovenox. Monitor platelet counts and any signs of bleeding 3. Pain Management: Neurontin 300 mg daily, Duragesic patch 12.5 g, Robaxin and oxycodone as needed 4. Mood: Valium 2.5 mg daily at bedtime as needed 5. Neuropsych: This patient is capable of making decisions on her own behalf. 6. Skin/Wound Care: Routine skin checks  Santyl to unstagable sacral ulcer  Prevalon boot to DTI  Shrinker for bulbous stump 7. Fluids/Electrolytes/Nutrition: Routine I&Os 8. Acute on chronic anemia.   Hb 8.2 on 3/8  Cont to monitor 9. Atrial fibrillation. Cardiac rate controlled. Continue Plavix 10. Hypertension. Lopressor 25 mg daily, Avapro 75 mg daily, HCTZ 6.25 mg daily.   Improving 11. ?CKD. Creatinine 1.02 baseline.   Cr 0.82 on 3/8 12. History of GI bleed. Continue Protonix. 13. Constipation. Laxative assistance 14. Hypokalemia  K 3.4 on 3/8  Supplement initiated 3/8  Cont to monitor 15. Hypoalbuminemia  Supplement initiated 3/8  LOS (Days) 2 A FACE TO FACE EVALUATION WAS  PERFORMED  Nancey Kreitz Lorie Phenix 01/23/2017 8:49 AM

## 2017-01-24 ENCOUNTER — Inpatient Hospital Stay (HOSPITAL_COMMUNITY): Payer: Medicare Other | Admitting: Occupational Therapy

## 2017-01-24 DIAGNOSIS — G5732 Lesion of lateral popliteal nerve, left lower limb: Secondary | ICD-10-CM

## 2017-01-24 MED ORDER — GABAPENTIN 400 MG PO CAPS
400.0000 mg | ORAL_CAPSULE | Freq: Every day | ORAL | Status: DC
  Administered 2017-01-24 – 2017-01-25 (×2): 400 mg via ORAL
  Filled 2017-01-24 (×2): qty 1

## 2017-01-24 NOTE — Progress Notes (Signed)
Occupational Therapy Session Note  Patient Details  Name: [name] MRN: [id number] Date of Birth: May 04, 1928  Today's Date: 01/24/2017 OT Individual Time: 0930-1004 OT Individual Time Calculation (min): 34 min    Short Term Goals: Week 1:  OT Short Term Goal 1 (Week 1): LTG=STG 2/2 estimated short LOS  Skilled Therapeutic Interventions/Progress Updates: Pt was lying in bed with family present at time of arrival, requesting to complete LB ADLs. Tx focus on endurance and ADL retraining while adhering to precautions. Pt transitioned from supine<EOB with supervision for cues on PWB through left elbow. With bathing setup at EOB, pt required assist for washing L LE due to pain. Attempted figure 4 position and circle sitting but these positions were too painful for her. Pt required Mod A overall for LB dressing. Pt easily fatigued with lateral leans for lifting pants over hips, required assist for this. Noticed saturated pink foam pad on sacrum that smelled like urine. Notified RN to change. Pt completed partial stand with Mod A (progressing to Max A due to fatigue) for 3 minutes in order for RN to change bandage. Afterwards pt returned to bed with supervision. She was left in care of RN to receive medication at time of departure.      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   Therapy/Group: Individual Therapy  Jiovanny Burdell A Moua Rasmusson 01/24/2017, 12:45 PM

## 2017-01-24 NOTE — Progress Notes (Signed)
[place] PHYSICAL MEDICINE & REHABILITATION     PROGRESS NOTE  Subjective/Complaints:  C/o numbness and pain in Left foot last noc , no trauma    ROS: Denies CP, SOB, N/V/D.  Objective: Vital Signs: Blood pressure (!) 141/61, pulse 88, temperature 97.9 F (36.6 C), temperature source Oral, resp. rate 20, weight 53.8 kg (118 lb 11.2 oz), SpO2 96 %. No results found.  Recent Labs  01/21/17 1941 01/22/17 0627  WBC 8.6 6.9  HGB 8.2* 8.2*  HCT 25.8* 25.5*  PLT 287 285    Recent Labs  01/21/17 1941 01/22/17 0627  NA  --  136  K  --  3.4*  CL  --  104  GLUCOSE  --  100*  BUN  --  10  CREATININE 0.81 0.82  CALCIUM  --  9.1   CBG (last 3)  No results for input(s): GLUCAP in the last 72 hours.  Wt Readings from Last 3 Encounters:  01/21/17 53.8 kg (118 lb 11.2 oz)  01/21/17 54.1 kg (119 lb 4.3 oz)  01/13/17 51.3 kg (113 lb)    Physical Exam:  BP (!) 141/61 (BP Location: Left Arm)   Pulse 88   Temp 97.9 F (36.6 C) (Oral)   Resp 20   Wt 53.8 kg (118 lb 11.2 oz)   SpO2 96%   BMI 21.71 kg/m  Constitutional: She appears well-developed. Frail  HENT: Normocephalic and atraumatic.  Eyes: EOMI. No discharge.  Cardiovascular: RRR.  No JVD. Respiratory: Effort normal and breath sounds normal.  GI: Soft. Bowel sounds are normal.  Musculoskeletal: She exhibits edema and tenderness.  Neurological: She is alert and oriented.  Motor: B/l UE 4/5 RLE: HF 4+/5 LLE: 4/5 (stable)  Skin. Right BKA site with dried blood and bulbous stump.   Left lower extremity bypass site clean and dry.  Unstagable sacral ulcer (not examined today) Left foot reduced sensation to LT to toes and dorsum of foot, medial ankle is sensate Pedal pulse is strong foot is warm Psychiatric: She has a normal mood and affect. Her behavior is normal.   Assessment/Plan: 1. Functional deficits secondary to occluded left leg bypass status post redo femoral artery exposure 01/16/2017 as well as right BKA  12/23/2016 which require 3+ hours per day of interdisciplinary therapy in a comprehensive inpatient rehab setting. Physiatrist is providing close team supervision and 24 hour management of active medical problems listed below. Physiatrist and rehab team continue to assess barriers to discharge/monitor patient progress toward functional and medical goals.  Function:  Bathing Bathing position   Position: Sitting EOB  Bathing parts Body parts bathed by patient: Right arm, Left arm, Chest, Abdomen, Buttocks, Front perineal area, Right upper leg, Left upper leg Body parts bathed by helper: Left lower leg  Bathing assist Assist Level: Touching or steadying assistance(Pt > 75%)      Upper Body Dressing/Undressing Upper body dressing   What is the patient wearing?: Pull over shirt/dress     Pull over shirt/dress - Perfomed by patient: Thread/unthread right sleeve, Pull shirt over trunk, Put head through opening, Thread/unthread left sleeve Pull over shirt/dress - Perfomed by helper: Pull shirt over trunk        Upper body assist Assist Level: Supervision or verbal cues, Set up   Set up : To obtain clothing/put away  Lower Body Dressing/Undressing Lower body dressing   What is the patient wearing?: Pants, Shoes     Pants- Performed by patient: Thread/unthread right pants leg, Thread/unthread  left pants leg Pants- Performed by helper: Pull pants up/down           Shoes - Performed by helper: Don/doff left shoe          Lower body assist Assist for lower body dressing: Touching or steadying assistance (Pt > 75%)      Toileting Toileting   Toileting steps completed by patient: Performs perineal hygiene Toileting steps completed by helper: Adjust clothing after toileting, Adjust clothing prior to toileting    Toileting assist Assist level: Touching or steadying assistance (Pt.75%)   Transfers Chair/bed transfer   Chair/bed transfer method: Lateral scoot Chair/bed transfer  assist level: Touching or steadying assistance (Pt > 75%) (to stabilize L ankle to prevent rolling) Chair/bed transfer assistive device: Sliding board, Armrests     Locomotion Ambulation Ambulation activity did not occur: Safety/medical concerns         Wheelchair   Type: Manual Max wheelchair distance: 150 ft Assist Level: Supervision or verbal cues  Cognition Comprehension Comprehension assist level: Follows complex conversation/direction with extra time/assistive device  Expression Expression assist level: Expresses complex ideas: With extra time/assistive device  Social Interaction Social Interaction assist level: Interacts appropriately with others with medication or extra time (anti-anxiety, antidepressant).  Problem Solving Problem solving assist level: Solves complex 90% of the time/cues < 10% of the time  Memory Memory assist level: Recognizes or recalls 90% of the time/requires cueing < 10% of the time     Medical Problem List and Plan: 1.  Decreased functional mobility secondary to occluded left leg bypass status post redo femoral artery exposure 01/16/2017 as well as right BKA 12/23/2016  Cont CIR 2.  DVT Prophylaxis/Anticoagulation: Subcutaneous Lovenox. Monitor platelet counts and any signs of bleeding 3. Pain Management: Neurontin 300 mg daily, Duragesic patch 12.5 g, Robaxin and oxycodone as needed 4. Mood: Valium 2.5 mg daily at bedtime as needed 5. Neuropsych: This patient is capable of making decisions on her own behalf. 6. Skin/Wound Care: Routine skin checks  Santyl to unstagable sacral ulcer  Prevalon boot to DTI  Shrinker for bulbous stump 7. Fluids/Electrolytes/Nutrition: Routine I&Os 8. Acute on chronic anemia.   Hb 8.2 on 3/8  Cont to monitor 9. Atrial fibrillation. Cardiac rate controlled. Continue Plavix 10. Hypertension. Lopressor 25 mg daily, Avapro 75 mg daily, HCTZ 6.25 mg daily.   Improving 11. ?CKD. Creatinine 1.02 baseline.   Cr 0.82 on  3/8 12. History of GI bleed. Continue Protonix. 13. Constipation. Laxative assistance 14. Hypokalemia  K 3.4 on 3/8  Supplement initiated 3/8  Cont to monitor 15. 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

## 2017-01-25 ENCOUNTER — Inpatient Hospital Stay (HOSPITAL_COMMUNITY): Payer: Medicare Other | Admitting: Occupational Therapy

## 2017-01-25 ENCOUNTER — Inpatient Hospital Stay (HOSPITAL_COMMUNITY): Payer: Medicare Other

## 2017-01-25 ENCOUNTER — Inpatient Hospital Stay (HOSPITAL_COMMUNITY): Payer: Medicare Other | Admitting: Physical Therapy

## 2017-01-25 MED ORDER — GABAPENTIN 100 MG PO CAPS
100.0000 mg | ORAL_CAPSULE | Freq: Two times a day (BID) | ORAL | Status: DC
  Administered 2017-01-25 – 2017-01-26 (×2): 100 mg via ORAL
  Filled 2017-01-25 (×2): qty 1

## 2017-01-25 NOTE — Progress Notes (Signed)
Physical Therapy Session Note  Patient Details  Name: [name] MRN: [id number] Date of Birth: 1928/09/21  Today's Date: 01/25/2017 PT Individual Time: 1130-1208 and 1300-1400 PT Individual Time Calculation (min): 38 min and 60 min  Short Term Goals: Week 1:  PT Short Term Goal 1 (Week 1): Patient will transfer bed <> wheelchair with mod A.  PT Short Term Goal 2 (Week 1): Patient will perform bed mobility on regular bed with min A.  PT Short Term Goal 3 (Week 1): Patient will initiate car transfer training.   Skilled Therapeutic Interventions/Progress Updates:   Treatment 1: Patient in wheelchair upon arrival, reporting L anterior thigh swelling and improved nerve pain in LLE after change in medication dosage. Patient instructed in slide board transfer from wheelchair to low compliant couch surface to simulate sofa that patient sleeps on at home. Patient able to place and remove board for downhill transfer to couch with steady assist overall but required total A to place board and max A to transfer uphill back to wheelchair with cues for positioning LLE with air cast donned to prevent ankle from rolling. Seated BLE therex for strengthening and ROM: ankle DF, hip flexion, and knee extension, each exercise to fatigue. Discussed possibly lowering floor to seat height of wheelchair to allow for improved use of LLE during transfers and level surface transfers from bed to sofa and discussed surface height of other areas in home that patient needs to access with daughter's SUV being most problematic. Plan to practice actual car transfer on Tuesday with family. Patient left sitting in wheelchair with needs in reach and daughter present.   Treatment 2: Patient in wheelchair upon arrival. In gym, focus on wheelchair setup in preparation for transfer from wheelchair to level mat table with verbal cues to remove leg rest before positioning wheelchair next to mat, patient able to place and remove slide  board with steady assist for transfer and cues for anterior weight shift to decrease shearing of buttocks and repositioning LLE to prevent ankle rolling, and lateral scooting along edge of mat with focus on anterior weight shift and repositioning LLE under body. Patient attempted to remove leg rests multiple times but unable due to upper extremity weakness. Sit <> supine with wedge behind patient on mat table with supervision and increased time. Supine BLE therex: LLE heel slides and RLE SLR to fatigue. Supine therex limited by sacral pain at location of pressure sore. Patient propelled wheelchair back to room with supervision until L elbow became sore. Patient left sitting in wheelchair with all needs within reach.   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   Therapy/Group: Individual Therapy  [name], [name] 01/25/2017, 12:15 PM

## 2017-01-25 NOTE — Progress Notes (Addendum)
Occupational Therapy Session Note  Patient Details  Name: [name] MRN: [id number] Date of Birth: [birth year]  Today's Date: 01/25/2017 OT Individual Time: 1445-1530 OT Individual Time Calculation (min): 45 min    Short Term Goals: Week 1:  OT Short Term Goal 1 (Week 1): LTG=STG 2/2 estimated short LOS  Skilled Therapeutic Interventions/Progress Updates: Pt was sitting in w/c at time of arrival with left aircast donned, reported fatigue from previous therapies but willing to participate. She self propelled 60 ft to dayroom with extra time and rest breaks. OT propelled her remainder of way for time. While in dayroom, she completed slideboard transfer to mat with Min A and cues for anterior weight shifting. Practiced simulated LB dressing with use of theraband at [facility], focusing on lifting band over hips to improve lateral leaning abilities for dressing tasks. With band partway over hips, pt reported feeling dizzy. BP assessed and recorded in flowsheet, 111/62. RN notified. Pt at this time leaned on right elbow for rest after 1 minute bouts of tx engagement, verbalizing fatigue. She then transferred back to w/c in manner as written above. She propelled chair 150 ft back to room for UB strengthening. Due to fatigue, she was propelled by therapist for remainder of way. Had pt listen to [employer] throughout tx which appeared to improve affect. Per pt report, she has never missed Sunday church services until her hospital admission and it saddens her to miss church now. With family consent, OT downloaded gospel music app on pts tablet to increase psychosocial wellness at CIR. Pt and family verbalized appreciation. Pt was left with family and gospel music playing at time of departure.      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 RLE Weight Bearing: Non weight bearing LLE Weight Bearing: Weight bearing as tolerated   Vital Signs: Therapy Vitals Temp: 97.7 F (36.5 C) Temp Source: Oral Pulse Rate: 99 Resp: 18 BP: 119/64 Patient Position (if appropriate): Sitting Oxygen Therapy SpO2: 100 % O2 Device: Not Delivered Pain: Pt reported pain to be manageable with rest breaks Pain Assessment Pain Assessment: 0-10 Pain Score: 2  Pain Type: Acute pain Pain Location: Leg Pain Orientation: Left Pain Descriptors / Indicators: Aching;Sharp;Shooting Pain Onset: On-going Pain Intervention(s): Repositioned ADL: ADL ADL Comments: Please see functional navigator     See Function Navigator for Current Functional Status.   Therapy/Group: Individual Therapy  [name] 01/25/2017, 4:43 PM

## 2017-01-25 NOTE — Progress Notes (Signed)
Occupational Therapy Session Note  Patient Details  Name: [name] MRN: [id number] Date of Birth: 1927-12-16  Today's Date: 01/25/2017 OT Individual Time: 1000-1055 OT Individual Time Calculation (min): 55 min    Short Term Goals: Week 1:  OT Short Term Goal 1 (Week 1): LTG=STG 2/2 estimated short LOS  Skilled Therapeutic Interventions/Progress Updates:    Pt seen sitting in bed with daughter present, agreeable to tx, but said, "I am moving slow today, I had a bad night." Focus of session on functional mobility, transfer training, sit to stand, and anterior weight shift. Pt completes 3 slide board tranfers w/c<>bed/mat with MIN A over all with tactile cues for anterior weight shift, support of L ankle and VC for hand placement and head hip relationship. In w/c positioned back from sink forcing anterior trunk flexion to reach faucet/spit, pt completes oral care with supervision. Pt completes lateral scoot transfer w/c <> mat with same cues as state above. While seated on mat, pt crosses midline to grab resistive clips from surface 10" from ground and place on lap-shoulder height rod with BLE. Pt reports feeling dizzy, and is educated on diaphramatic breating. BP taken at 94/54, was recorded in chart and RN notified. Pt wishes to continue with therapy as dizziness subsided after rest. In parallel bars, Pt sit to squat with MOD A for lifting holding 10-15 seconds for 2 trials with VC for weight shifting and posture. Pt propels w/c partial way back to room with increased time to improve BUE strength and endurance. Exited session with daughter in room and all needs met.  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   Therapy/Group: Individual Therapy  Tonny Branch 01/25/2017, 11:30 AM

## 2017-01-25 NOTE — Progress Notes (Signed)
[facility] PHYSICAL MEDICINE & REHABILITATION     PROGRESS NOTE  Subjective/Complaints:   LLE pain better at night but had daytime pain yesterday  ROS: Denies CP, SOB, N/V/D.  Objective: Vital Signs: Blood pressure 138/61, pulse 80, temperature 97.9 F (36.6 C), temperature source Oral, resp. rate 18, weight 53.1 kg (117 lb), SpO2 95 %. No results found. No results for input(s): WBC, HGB, HCT, PLT in the last 72 hours. No results for input(s): NA, K, CL, GLUCOSE, BUN, CREATININE, CALCIUM in the last 72 hours.  Invalid input(s): CO CBG (last 3)  No results for input(s): GLUCAP in the last 72 hours.  Wt Readings from Last 3 Encounters:  01/25/17 53.1 kg (117 lb)  01/21/17 54.1 kg (119 lb 4.3 oz)  01/13/17 51.3 kg (113 lb)    Physical Exam:  BP 138/61 (BP Location: Left Arm)   Pulse 80   Temp 97.9 F (36.6 C) (Oral)   Resp 18   Wt 53.1 kg (117 lb)   SpO2 95%   BMI 21.40 kg/m  Constitutional: She appears well-developed. Frail  HENT: Normocephalic and atraumatic.  Eyes: EOMI. No discharge.  Cardiovascular: RRR.  No JVD. Respiratory: Effort normal and breath sounds normal.  GI: Soft. Bowel sounds are normal.  Musculoskeletal: She exhibits edema and tenderness.  Neurological: She is alert and oriented.  Motor: B/l UE 4/5 RLE: HF 4+/5 LLE: 4/5 (stable)  Skin. Right BKA site with dried blood and bulbous stump.   Left lower extremity bypass site clean and dry.  Unstagable sacral ulcer (not examined today) Left foot reduced sensation to LT to toes and dorsum of foot, medial ankle is sensate Pedal pulse is strong foot is warm Psychiatric: She has a normal mood and affect. Her behavior is normal.   Assessment/Plan: 1. 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 Physiatrist  is providing close team supervision and 24 hour management of active medical problems listed below. Physiatrist and rehab team continue to assess barriers to discharge/monitor patient progress toward functional and medical goals.  Function:  Bathing Bathing position   Position: Sitting EOB  Bathing parts Body parts bathed by patient: Front perineal area, Right upper leg, Left upper leg Body parts bathed by helper: Left lower leg  Bathing assist Assist Level: Touching or steadying assistance(Pt > 75%)      Upper Body Dressing/Undressing Upper body dressing   What is the patient wearing?: Pull over shirt/dress     Pull over shirt/dress - Perfomed by patient: Thread/unthread right sleeve, Pull shirt over trunk, Put head through opening, Thread/unthread left sleeve Pull over shirt/dress - Perfomed by helper: Pull shirt over trunk        Upper body assist Assist Level: Supervision or verbal cues, Set up   Set up : To obtain clothing/put away  Lower Body Dressing/Undressing Lower body dressing   What is the patient wearing?: Pants, Non-skid slipper socks, AFO     Pants- Performed by patient: Thread/unthread right pants leg, Thread/unthread left pants leg Pants- Performed by helper: Pull pants up/down   Non-skid slipper socks- Performed by helper: Don/doff left sock       Shoes - Performed by helper: Don/doff left shoe   AFO - Performed by helper: Don/doff left AFO (aircast)      Lower body assist Assist for lower body  dressing:  (Mod A)      Toileting Toileting   Toileting steps completed by patient: Performs perineal hygiene Toileting steps completed by helper: Adjust clothing after toileting, Adjust clothing prior to toileting    Toileting assist Assist level: Touching or steadying assistance (Pt.75%)   Transfers Chair/bed transfer   Chair/bed transfer method: Lateral scoot Chair/bed transfer assist level: Touching or steadying assistance (Pt > 75%) (to stabilize L  ankle to prevent rolling) Chair/bed transfer assistive device: Sliding board, Armrests     Locomotion Ambulation Ambulation activity did not occur: Safety/medical concerns         Wheelchair   Type: Manual Max wheelchair distance: 150 ft Assist Level: Supervision or verbal cues  Cognition Comprehension Comprehension assist level: Follows complex conversation/direction with extra time/assistive device  Expression Expression assist level: Expresses complex ideas: With extra time/assistive device  Social Interaction Social Interaction assist level: Interacts appropriately with others with medication or extra time (anti-anxiety, antidepressant).  Problem Solving Problem solving assist level: Solves complex 90% of the time/cues < 10% of the time  Memory Memory assist level: Recognizes or recalls 90% of the time/requires cueing < 10% of the time     Medical Problem List and Plan: 1.  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  DVT Prophylaxis/Anticoagulation: Subcutaneous Lovenox. Monitor platelet counts and any signs of bleeding 3. Pain Management: Neurontin 300 mg daily, Duragesic patch 12.5 g, Robaxin and oxycodone as needed 4. Mood: Valium 2.5 mg daily at bedtime as needed 5. Neuropsych: This patient is capable of making decisions on her own behalf. 6. Skin/Wound Care: Routine skin checks  Santyl to unstagable sacral ulcer  Prevalon boot to DTI  Shrinker for bulbous stump 7. Fluids/Electrolytes/Nutrition: Routine I&Os 8. Acute on chronic anemia.   Hb 8.2 on 3/8  Cont to monitor 9. Atrial fibrillation. Cardiac rate controlled. Continue Plavix 10. Hypertension. Lopressor 25 mg daily, Avapro 75 mg daily, HCTZ 6.25 mg daily.   Improving 11. ?CKD. Creatinine 1.02 baseline.   Cr 0.82 on 3/8 12. History of GI bleed. Continue Protonix. 13. Constipation.  Laxative assistance 14. Hypokalemia  K 3.4 on 3/8  Supplement initiated 3/8  Cont to monitor 15. 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

## 2017-01-26 ENCOUNTER — Inpatient Hospital Stay (HOSPITAL_COMMUNITY): Payer: Medicare Other | Admitting: Physical Therapy

## 2017-01-26 ENCOUNTER — Inpatient Hospital Stay (HOSPITAL_COMMUNITY): Payer: Medicare Other | Admitting: Occupational Therapy

## 2017-01-26 DIAGNOSIS — M792 Neuralgia and neuritis, unspecified: Secondary | ICD-10-CM

## 2017-01-26 DIAGNOSIS — D62 Acute posthemorrhagic anemia: Secondary | ICD-10-CM

## 2017-01-26 MED ORDER — GABAPENTIN 300 MG PO CAPS
300.0000 mg | ORAL_CAPSULE | Freq: Three times a day (TID) | ORAL | Status: DC
  Administered 2017-01-27: 300 mg via ORAL
  Filled 2017-01-26: qty 1

## 2017-01-26 MED ORDER — GABAPENTIN 600 MG PO TABS
300.0000 mg | ORAL_TABLET | Freq: Three times a day (TID) | ORAL | Status: DC
  Administered 2017-01-26 (×2): 300 mg via ORAL
  Filled 2017-01-26 (×3): qty 1

## 2017-01-26 NOTE — Progress Notes (Signed)
Occupational Therapy Session Note  Patient Details  Name: [name] MRN: [id number] Date of Birth: January 20, 1928  Today's Date: 01/26/2017 OT Individual Time: 1002-1100 OT Individual Time Calculation (min): 58 min    Short Term Goals: Week 1:  OT Short Term Goal 1 (Week 1): LTG=STG 2/2 estimated short LOS  Skilled Therapeutic Interventions/Progress Updates:    OT session focused on modified bathing/dressing, transfer training, and sit<>stand in preparation for ADLs. UB bathing completed with set-up with water basin. Pt transferred back to bed w/ Min A via slide-board to wash LB with min A. RN entered to change sacral dressing. OT educated pt on LB dressing using rolling technique in bed. Min A slide board bed>drop arm BSC. Mod A to manage pants before and after voiding. Utilized partial sit<>stand to clear bottom from BSSC while OT pulled pants over hips. Slide-board transfer to R to return to w/c Min A. Pt left with needs met.   Therapy Documentation Precautions:  Precautions Precautions: Fall Precaution Comments: pt not to push or pull with Lt elbow more than 5 lbs (s/p total elbow replacement) , R BKA, LLE painful Restrictions Weight Bearing Restrictions: Yes LUE Weight Bearing: Partial weight bearing LUE Partial Weight Bearing Percentage or Pounds: no more than 5 lbs 2/2 old total elbow replacement RLE Weight Bearing: Non weight bearing LLE Weight Bearing: Weight bearing as tolerated ADL: ADL ADL Comments: Please see functional navigator  See Function Navigator for Current Functional Status.   Therapy/Group: Individual Therapy  Valma Cava 01/26/2017, 10:31 AM

## 2017-01-26 NOTE — Progress Notes (Signed)
Physical Therapy Session Note  Patient Details  Name: [name] MRN: [id number] Date of Birth: [birth year]  Today's Date: 01/26/2017 PT Individual Time: 0800-0900 PT Individual Time Calculation (min): 60 min   Short Term Goals: Week 1:  PT Short Term Goal 1 (Week 1): Patient will transfer bed <> wheelchair with mod A.  PT Short Term Goal 2 (Week 1): Patient will perform bed mobility on regular bed with min A.  PT Short Term Goal 3 (Week 1): Patient will initiate car transfer training.   Skilled Therapeutic Interventions/Progress Updates:    Session 1: Pt in bed eating breakfast upon arrival. Starting session with bed exercises including; bilateral quadsets, knee flexion, SLR, hip abd/add, LLE ankle pumps. Supervision level for pt to transfer from supine to sitting using rail to assist. Slideboard transfer performed from bed to chair with min assist/min guard assist and cues. Difficulty with board placement on soft surface. Once in chair, pt refusing to propel in hallway but working on tight spaces and wheelchair management of leg/arm rests. Pt up in chair with call light after session.   Session 2: Pt up in chair upon arrival, daughter present. Pt propelling w/c 150 ft with breaks as needed. Slideboard transfers w/c to mat table performed at variable heights with max incline of mat at 26 inches (simulating vehicle transfer). Working on anterior lean, use of Lt LE and Rt hand placement for incline. On mat table, performing LE strengthening exercises with QS, SAQ, seated hip adduction/abduction, LAQ, hip flexion. Pt in day room with daughter following session.   Therapy Documentation Precautions:  Precautions Precautions: Fall Precaution Comments: pt not to push or pull with Lt elbow more than 5 lbs (s/p total elbow replacement) , R BKA, LLE painful Restrictions Weight Bearing Restrictions: Yes LUE Weight Bearing: Partial weight bearing LUE Partial Weight Bearing Percentage or Pounds:  no more than 5 lbs 2/2 old total elbow replacement RLE Weight Bearing: Non weight bearing LLE Weight Bearing: Weight bearing as tolerated  Pain: 5/10 sharp through LLE, nursing providing medication during session, monitored throughout.   See Function Navigator for Current Functional Status.   Therapy/Group: Individual Therapy  Cassell Clement, PT, CSCS Pager [phone] Office [phone]  01/26/2017, 12:44 PM

## 2017-01-26 NOTE — Progress Notes (Signed)
[facility] PHYSICAL MEDICINE & REHABILITATION     PROGRESS NOTE  Subjective/Complaints:  Pt seen laying in bed this AM.  She slept well overnight and had a good weekend.  She complain of pain in her left foot this AM.    ROS: +Left foot pain. Denies CP, SOB, N/V/D.  Objective: Vital Signs: Blood pressure (!) 148/57, pulse 82, temperature 97.4 F (36.3 C), temperature source Oral, resp. rate 18, weight 48.1 kg (106 lb), SpO2 97 %. No results found. No results for input(s): WBC, HGB, HCT, PLT in the last 72 hours. No results for input(s): NA, K, CL, GLUCOSE, BUN, CREATININE, CALCIUM in the last 72 hours.  Invalid input(s): CO CBG (last 3)  No results for input(s): GLUCAP in the last 72 hours.  Wt Readings from Last 3 Encounters:  01/26/17 48.1 kg (106 lb)  01/21/17 54.1 kg (119 lb 4.3 oz)  01/13/17 51.3 kg (113 lb)    Physical Exam:  BP (!) 148/57 (BP Location: Left Arm)   Pulse 82   Temp 97.4 F (36.3 C) (Oral)   Resp 18   Wt 48.1 kg (106 lb)   SpO2 97%   BMI 19.39 kg/m  Constitutional: She appears well-developed. Frail  HENT: Normocephalic and atraumatic.  Eyes: EOMI. No discharge.  Cardiovascular: RRR.  No JVD. Respiratory: Effort normal and breath sounds normal.  GI: Soft. Bowel sounds are normal.  Musculoskeletal: She exhibits edema and tenderness.  Neurological: She is alert and oriented.  Motor: B/l UE 4/5 RLE: HF 4+/5 LLE: 4/5 (unchanged)  Skin. Right BKA site with dried blood and bulbous stump.   Left lower extremity bypass site clean and dry.  Unstagable sacral ulcer (not examined today) Left foot reduced sensation to LT to toes and dorsum of foot, medial ankle is sensate Psychiatric: She has a normal mood and affect. Her behavior is normal.  Assessment/Plan: 1. Functional deficits secondary to occluded left leg bypass status post redo femoral artery exposure 01/16/2017 as well as right BKA 12/23/2016 which require 3+ hours per day of interdisciplinary  therapy in a comprehensive inpatient rehab setting. Physiatrist is providing close team supervision and 24 hour management of active medical problems listed below. Physiatrist and rehab team continue to assess barriers to discharge/monitor patient progress toward functional and medical goals.  Function:  Bathing Bathing position   Position: Sitting EOB  Bathing parts Body parts bathed by patient: Front perineal area, Right upper leg, Left upper leg Body parts bathed by helper: Left lower leg  Bathing assist Assist Level: Touching or steadying assistance(Pt > 75%)      Upper Body Dressing/Undressing Upper body dressing   What is the patient wearing?: Pull over shirt/dress     Pull over shirt/dress - Perfomed by patient: Thread/unthread right sleeve, Pull shirt over trunk, Put head through opening, Thread/unthread left sleeve Pull over shirt/dress - Perfomed by helper: Pull shirt over trunk        Upper body assist Assist Level: Supervision or verbal cues, Set up   Set up : To obtain clothing/put away  Lower Body Dressing/Undressing Lower body dressing   What is the patient wearing?: Pants, Non-skid slipper socks, AFO     Pants- Performed by patient: Thread/unthread right pants leg, Thread/unthread left pants leg Pants- Performed by helper: Pull pants up/down   Non-skid slipper socks- Performed by helper: Don/doff left sock       Shoes - Performed by helper: Don/doff left shoe   AFO - Performed by helper:  Don/doff left AFO (aircast)      Lower body assist Assist for lower body dressing:  (Mod A)      Toileting Toileting   Toileting steps completed by patient: Performs perineal hygiene Toileting steps completed by helper: Adjust clothing after toileting, Adjust clothing prior to toileting    Toileting assist Assist level: Touching or steadying assistance (Pt.75%)   Transfers Chair/bed transfer   Chair/bed transfer method: Lateral scoot Chair/bed transfer assist  level: Touching or steadying assistance (Pt > 75%) Chair/bed transfer assistive device: Sliding board, Armrests     Locomotion Ambulation Ambulation activity did not occur: Safety/medical concerns         Wheelchair   Type: Manual Max wheelchair distance: 100 ft Assist Level: Supervision or verbal cues  Cognition Comprehension Comprehension assist level: Follows complex conversation/direction with extra time/assistive device  Expression Expression assist level: Expresses complex ideas: With extra time/assistive device  Social Interaction Social Interaction assist level: Interacts appropriately with others with medication or extra time (anti-anxiety, antidepressant).  Problem Solving Problem solving assist level: Solves complex 90% of the time/cues < 10% of the time  Memory Memory assist level: Recognizes or recalls 90% of the time/requires cueing < 10% of the time     Medical Problem List and Plan: 1.  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  DVT Prophylaxis/Anticoagulation: Subcutaneous Lovenox. Monitor platelet counts and any signs of bleeding 3. Pain Management: Duragesic patch 12.5 g, Robaxin and oxycodone as needed  Gabapentin increased to 300 TID on 3/12 4. Mood: Valium 2.5 mg daily at bedtime as needed 5. Neuropsych: This patient is capable of making decisions on her own behalf. 6. Skin/Wound Care: Routine skin checks  Santyl to unstagable sacral ulcer  Prevalon boot to DTI  Shrinker for bulbous stump 7. Fluids/Electrolytes/Nutrition: Routine I&Os 8. Acute on chronic anemia.   Hb 8.2 on 3/8  Cont to monitor 9. Atrial fibrillation. Cardiac rate controlled. Continue Plavix 10. Hypertension. Lopressor 25 mg daily, Avapro 75 mg daily, HCTZ 6.25 mg daily.   Relatively controlled 3/12 11. ?CKD. Creatinine 1.02 baseline.   Cr 0.82 on 3/8 12.  History of GI bleed. Continue Protonix. 13. Constipation. Laxative assistance 14. Hypokalemia  K 3.4 on 3/8  Supplement initiated 3/8  Cont to monitor 15. Hypoalbuminemia  Supplement initiated 3/8  LOS (Days) 5 A FACE TO FACE EVALUATION WAS PERFORMED  [name] 01/26/2017 9:57 AM

## 2017-01-27 ENCOUNTER — Inpatient Hospital Stay (HOSPITAL_COMMUNITY): Payer: Medicare Other | Admitting: Occupational Therapy

## 2017-01-27 ENCOUNTER — Inpatient Hospital Stay (HOSPITAL_COMMUNITY): Payer: Medicare Other | Admitting: Physical Therapy

## 2017-01-27 LAB — CBC WITH DIFFERENTIAL/PLATELET
BASOS ABS: 0.1 10*3/uL (ref 0.0–0.1)
BASOS PCT: 1 %
EOS ABS: 0.6 10*3/uL (ref 0.0–0.7)
EOS PCT: 9 %
HEMATOCRIT: 25.2 % — AB (ref 36.0–46.0)
Hemoglobin: 8 g/dL — ABNORMAL LOW (ref 12.0–15.0)
Lymphocytes Relative: 32 %
Lymphs Abs: 2 10*3/uL (ref 0.7–4.0)
MCH: 29.6 pg (ref 26.0–34.0)
MCHC: 31.7 g/dL (ref 30.0–36.0)
MCV: 93.3 fL (ref 78.0–100.0)
MONO ABS: 0.9 10*3/uL (ref 0.1–1.0)
MONOS PCT: 14 %
NEUTROS ABS: 2.7 10*3/uL (ref 1.7–7.7)
Neutrophils Relative %: 44 %
Platelets: 327 10*3/uL (ref 150–400)
RBC: 2.7 MIL/uL — ABNORMAL LOW (ref 3.87–5.11)
RDW: 16.5 % — AB (ref 11.5–15.5)
WBC: 6.1 10*3/uL (ref 4.0–10.5)

## 2017-01-27 LAB — BASIC METABOLIC PANEL
ANION GAP: 8 (ref 5–15)
BUN: 15 mg/dL (ref 6–20)
CALCIUM: 9.1 mg/dL (ref 8.9–10.3)
CO2: 24 mmol/L (ref 22–32)
CREATININE: 0.92 mg/dL (ref 0.44–1.00)
Chloride: 104 mmol/L (ref 101–111)
GFR calc Af Amer: >60 mL/min (ref >60)
GFR, EST NON AFRICAN AMERICAN: 55 mL/min — AB (ref >60)
GLUCOSE: 92 mg/dL (ref 65–99)
Potassium: 3.7 mmol/L (ref 3.5–5.1)
Sodium: 136 mmol/L (ref 135–145)

## 2017-01-27 MED ORDER — GABAPENTIN 300 MG PO CAPS
300.0000 mg | ORAL_CAPSULE | Freq: Two times a day (BID) | ORAL | Status: DC
Start: 2017-01-27 — End: 2017-01-28
  Administered 2017-01-27 – 2017-01-28 (×2): 300 mg via ORAL
  Filled 2017-01-27 (×2): qty 1

## 2017-01-27 MED ORDER — GABAPENTIN 600 MG PO TABS
600.0000 mg | ORAL_TABLET | Freq: Every day | ORAL | Status: DC
  Administered 2017-01-27: 600 mg via ORAL
  Filled 2017-01-27: qty 1

## 2017-01-27 NOTE — Discharge Summary (Signed)
NAMECAPRI, VEALS              ACCOUNT NO.:  [id number]  MEDICAL RECORD NO.:  [id number]  LOCATION:                                 FACILITY:  PHYSICIAN:  Delice Lesch, MD        DATE OF BIRTH:  November 20, 1927  DATE OF ADMISSION:  01/21/2017 DATE OF DISCHARGE:  01/28/2017                              DISCHARGE SUMMARY   DISCHARGE DIAGNOSES: 1. Decreased functional ability secondary to occluded left bypass,     status post redo femoral artery exposure as well as right below-     knee amputation on December 23, 2016. 2. Subcutaneous Lovenox for DVT prophylaxis. 3. Pain management. 4. Acute-on-chronic anemia. 5. Atrial fibrillation. 6. Hypertension. 7. History of gastrointestinal bleed. 8. Constipation. 9. Hypokalemia resolved. 10.Decreased nutritional storage.  HISTORY OF PRESENT ILLNESS:  This is an 83 year old right-handed female with history of atrial fibrillation, maintained on Plavix, GI bleed, chronic back pain, hypertension, peripheral vascular disease with femoral popliteal bypass grafting on September 24, 2016, as well as a left BKA, December 23, 2016; received inpatient rehab services December 30, 2016 until January 13, 2017; was discharged to home.  She was able to complete sliding board transfers with minimal assist, assistance by her family.  Presented back January 13, 2017, with left leg numbness, increasing pain.  Duplex study ultrasound showed occlusion of left leg bypass site.  Underwent left Profunda to anterior tibial bypass with redo femoral artery exposure, January 16, 2017, per Vascular Surgery. Hospital course, pain management.  Acute-on-chronic anemia at 7.2 to 8.3, and monitored.  Subcutaneous Lovenox for DVT prophylaxis.  Physical and Occupational Therapy ongoing.  The patient was admitted for a comprehensive rehab program.  PAST MEDICAL HISTORY:  See discharge diagnoses.  SOCIAL HISTORY:  Lives with spouse and family, used a sliding board for transfers  prior to admission.  Functional status upon admission to rehab services +2 physical assist, stand pivot transfers; max assist, sit to supine; max assist activities of daily living.  PHYSICAL EXAMINATION:  VITAL SIGNS:  Blood pressure 170/57, pulse 94, temperature 98, respirations 18. GENERAL:  This was an alert female, oriented to person, place, and time. HEENT:  EOM is intact. NECK:  Supple.  Nontender.  No JVD. CARDIAC:  Regular rate and rhythm.  No murmur. ABDOMEN:  Soft, nontender.  Good bowel sounds. LUNGS:  Clear to auscultation without wheeze. EXTREMITIES:  Right BKA site was dressed, clean.  No odor.  Left lower extremity bypass site clean and dry.  Appropriately tender.  REHABILITATION HOSPITAL COURSE:  The patient was admitted to inpatient rehab services with therapies initiated on a 3-hour daily basis, consisting of physical therapy, occupational therapy, and rehabilitation nursing.  The following issues were addressed during the patient's rehabilitation stay.  Pertaining to [name]'s left leg bypass redo femoral artery exposure site clean and dry, appropriately tender.  She would follow up with Vascular Surgery.  Recent right BKA, December 23, 2016, phantom pain had greatly improved.  Subcutaneous Lovenox for DVT prophylaxis.  No bleeding episodes.  Pain management with Neurontin increased to 300 mg b.i.d.  She was also using Robaxin and oxycodone for breakthrough pain and spasms.  She  did have an unstageable sacral ulcer, Santyl had been applied.  She was using a shrinker for her stump.  Acute- on-chronic anemia at 8.0, remained asymptomatic.  Cardiac rate controlled.  She continued on Plavix therapy.  Bouts of hypokalemia resolved with potassium supplement.  The patient received weekly collaborative interdisciplinary team conferences to discuss estimated length of stay, family teaching, any barriers to her discharge. Supervision level for patient to transfer from  supine to sitting using rail to assist.  Sliding board transfers were performed from bed to chair with minimal assist.  Propelled her wheelchair with supervision. Sliding board transfers, wheelchair to mat and table performed at variable heights with max incline of 26 inches.  She needed some assistance for lower body dressing with simple set up.  Working with energy conservation techniques.  Her fatigue factors had improved.  Full family teaching was completed and plan discharge to home.  DISCHARGE MEDICATIONS: 1. Vitamin D 2000 units p.o. at bedtime. 2. Plavix 75 mg p.o. daily. 3. Colace 100 mg p.o. daily. 4. Duragesic patch 12.5 mcg change every 72 hours. 5. Neurontin 300 mg p.o. b.i.d. and 600 mg at bedtime. 6. Hydrochlorothiazide 6.25 mg p.o. daily. 7. Avapro 75 mg p.o. daily. 8. Lopressor 25 mg p.o. at bedtime. 9. Multivitamin daily. 10.Protonix 40 mg p.o. daily. 11.Potassium chloride 10 mEq p.o. daily. 12.Vitamin B12 100 mcg p.o. daily. 13.Valium 2.5 mg p.o. at bedtime as needed. 14.Robaxin 500 mg p.o. every 6 hours as needed for muscle spasms. 15.Oxycodone immediate release 5 mg p.o. every 4 hours as needed for     pain.  DIET:  Regular.  SPECIAL INSTRUCTIONS:  Santyl ointment applied to sacral ulcer daily. The patient would follow up with Dr. Delice Lesch, the Outpatient Rehab Service as directed; [name], Vascular Surgery; Dr. Marcellus Scott, Medical Management.     Lauraine Rinne, P.A.   ______________________________ Delice Lesch, MD    DA/MEDQ  D:  01/27/2017  T:  01/27/2017  Job:  [id number]  cc:   Delice Lesch, MD Henrine Screws, M.D. Eldridge Abrahams, MD

## 2017-01-27 NOTE — Progress Notes (Signed)
Social Work  Discharge Note  The overall goal for the admission was met for:   Discharge location: [place] ASSISTING-24 HR  Length of Stay: Yes-7 DAYS  Discharge activity level: Yes-SUPERVISION-MIN WHEELCHAIR LEVEL  Home/community participation: Yes  Services provided included: MD, RD, PT, OT, RN, CM, Pharmacy and SW  Financial Services: Medicare and Private Insurance: [place]  Follow-up services arranged: Home Health: [facility]-PT,OT,RN and Patient/Family request agency HH: ACTIVE PT WITH , DME: HAS ALL DME NEEDS  Comments (or additional information):DAUGHTER WAS HERE TO DO AN ACTUAL CAR TRANSFER WITH PT AND IT WENT WELL. PT READY TO GO HOME AND BE BACK WITH HER HUSBAND.  Patient/Family verbalized understanding of follow-up arrangements: Yes  Individual responsible for coordination of the follow-up plan: SELF & CINDY-DAUGHTER  Confirmed correct DME delivered: [name] 01/27/2017    [name]

## 2017-01-27 NOTE — Progress Notes (Addendum)
Social Work Patient ID: [name], female   DOB: September 26, 1928, 83 y.o.   MRN: [id number]  Daughter bringing actual car to practice car transfers with pt during PT session. Pt is wanting to go home soon and Really needs to be able to do this with daughter. Sherrita Riederer-PT to let worker know how it went. Will go from there. Have contacted [facility] since pt was not home long enough to have home health services begin. Awaiting return call from [place]. Car transfer went well feels ready to go home tomorrow, checking with MD regarding ok with this plan.

## 2017-01-27 NOTE — Progress Notes (Signed)
Occupational Therapy Discharge Summary  Patient Details  Name: [name] MRN: [id number] Date of Birth: [birth year]  Today's Date: 01/27/2017  Patient has met 5 of 5 long term goals due to improved activity tolerance, improved balance and ability to compensate for deficits.  Patient to discharge at [facility] Assist level.  Patient's care partner is independent to provide the necessary physical assistance at discharge.    Reasons goals not met: N/A   Recommendation:  Patient will benefit from ongoing skilled OT services in home health setting to continue to advance functional skills in the area of BADL and Reduce care partner burden.  Equipment: No equipment provided - pt has all needed equipment from previous admission  Reasons for discharge: treatment goals met and discharge from hospital  Patient/family agrees with progress made and goals achieved: Yes  OT Discharge Precautions/Restrictions  Precautions Precautions: Fall Precaution Comments: pt not to push or pull with Lt elbow more than 5 lbs (s/p total elbow replacement) , R BKA, LLE painful Restrictions Weight Bearing Restrictions: Yes RLE Weight Bearing: Non weight bearing LLE Weight Bearing: Weight bearing as tolerated Pain Pain Assessment Pain Assessment: 0-10 Pain Score: 6  Pain Type: Neuropathic pain Pain Location: Leg Pain Orientation: Left Pain Onset: On-going Patients Stated Pain Goal: 2 Pain Intervention(s): Repositioned ADL ADL Grooming: Modified independent Upper Body Bathing: Setup Where Assessed-Upper Body Bathing: Sitting at sink Lower Body Bathing: Minimal assistance Where Assessed-Lower Body Bathing: Bed level, Edge of bed Upper Body Dressing: Setup Where Assessed-Upper Body Dressing: Wheelchair Lower Body Dressing: Minimal assistance Where Assessed-Lower Body Dressing: Edge of bed, Bed level Toileting: Minimal assistance Where Assessed-Toileting: Bedside Commode Toilet Transfer:  Minimal assistance Toilet Transfer Method: Theatre manager: Drop arm bedside commode Tub/Shower Transfer: Minimal assistance Tub/Shower Transfer Method: Administrator, arts: Transfer tub bench ADL Comments: Please see functional navigator Cognition Overall Cognitive Status: Within Functional Limits for tasks assessed Arousal/Alertness: Awake/alert Orientation Level: Oriented X4 Sensation Sensation Light Touch: Impaired Detail Light Touch Impaired Details: Impaired LLE Coordination Gross Motor Movements are Fluid and Coordinated: Yes Fine Motor Movements are Fluid and Coordinated: Yes Motor  Motor Motor - Skilled Clinical Observations: generalized muscle weakness Balance Static Sitting Balance Static Sitting - Balance Support: No upper extremity supported Static Sitting - Level of Assistance: 6: Modified independent (Device/Increase time) Dynamic Sitting Balance Dynamic Sitting - Balance Support: Feet supported;During functional activity;Bilateral upper extremity supported Dynamic Sitting - Level of Assistance: 6: Modified independent (Device/Increase time) Dynamic Sitting - Balance Activities: Forward lean/weight shifting;Lateral lean/weight shifting;Reaching across midline Extremity/Trunk Assessment RUE Assessment RUE Assessment: Exceptions to [facility] RUE AROM (degrees) RUE Overall AROM Comments: R shoulder AROM limited to 50- 60 degrees from fx in 2014 (Same as PTA) LUE Assessment LUE Assessment: Exceptions to WFL LUE AROM (degrees) LUE Overall AROM Comments: WFL, no weight bearing through elbow of more than 5 lbs from total elbow arthroplasty (same as PTA)   See Function Navigator for Current Functional Status.  Daneen Schick Nevin Grizzle 01/28/2017, 12:20 PM

## 2017-01-27 NOTE — Discharge Summary (Signed)
Discharge summary job # [id number]

## 2017-01-27 NOTE — Progress Notes (Signed)
Occupational Therapy Session Note  Patient Details  Name: [name] MRN: [id number] Date of Birth: 10-09-1928  Today's Date: 01/27/2017 OT Individual Time: 3007-6226 OT Individual Time Calculation (min): 56 min    Short Term Goals: Week 1:  OT Short Term Goal 1 (Week 1): LTG=STG 2/2 estimated short LOS  Skilled Therapeutic Interventions/Progress Updates:    1:1 OT session focused on transfer training, dynamic sitting balance, LLE strengthening, and home modifications. Pt reports improved pain since previous therapy session this morning. Pt required overall min A from daughter for ADL/self-care tasks. Daughter will be assisting pt at home. Slide-board transfers wc>toilet and wc> tub transfer bench with overall min guard A. Dynamic sitting balance activity with focus on weight shifting to R and balance strategies. Pt returned to room at end of session with needs met.  Therapy Documentation Precautions:  Precautions Precautions: Fall Precaution Comments: pt not to push or pull with Lt elbow more than 5 lbs (s/p total elbow replacement) , R BKA, LLE painful Restrictions Weight Bearing Restrictions: Yes LUE Weight Bearing: Partial weight bearing LUE Partial Weight Bearing Percentage or Pounds: no more than 5 lbs 2/2 old total elbow replacement RLE Weight Bearing: Non weight bearing LLE Weight Bearing: Weight bearing as tolerated Pain: Pain Assessment Pain Assessment: 0-10 Pain Score: 5  Pain Type: Acute pain Pain Location: Foot Pain Orientation: Left Pain Descriptors / Indicators: Aching Pain Onset: With Activity Pain Intervention(s): Repositioned ADL: ADL ADL Comments: Please see functional navigator  See Function Navigator for Current Functional Status.   Therapy/Group: Individual Therapy  Valma Cava 01/27/2017, 12:33 PM

## 2017-01-27 NOTE — Discharge Instructions (Signed)
Inpatient Rehab Discharge Instructions  [name] Discharge date and time: No discharge date for patient encounter.   Activities/Precautions/ Functional Status: Activity: activity as tolerated Diet: regular diet Wound Care: keep wound clean and dry Functional status:  ___ No restrictions     ___ Walk up steps independently ___ 24/7 supervision/assistance   ___ Walk up steps with assistance ___ Intermittent supervision/assistance  ___ Bathe/dress independently ___ Walk with walker     _x__ Bathe/dress with assistance ___ Walk Independently    ___ Shower independently ___ Walk with assistance    ___ Shower with assistance ___ No alcohol     ___ Return to work/school ________  Special Instructions:  Santyl ointment daily sacral ulcer  COMMUNITY REFERRALS UPON DISCHARGE:    Home Health:   PT, OT, RN   [place]   Date of last service:  Medical Equipment/Items Ordered:HAS ALL EQUIPMENT FROM PREVIOUS ADMISSION    GENERAL COMMUNITY RESOURCES FOR PATIENT/FAMILY: Support Groups:[facility] SUPPORT GROUP THE FIRST Tuesday @ 7:00-8:30 PM AT [place] [phone]  My questions have been answered and I understand these instructions. I will adhere to these goals and the provided educational materials after my discharge from the hospital.  Patient/Caregiver Signature _______________________________ Date __________  Clinician Signature _______________________________________ Date __________  Please bring this form and your medication list with you to all your follow-up doctor's appointments.

## 2017-01-27 NOTE — Progress Notes (Signed)
Occupational Therapy Note  Patient Details  Name: [name] MRN: [id number] Date of Birth: 01-01-1928  Today's Date: 01/27/2017 OT Individual Time: 4098-1191 OT Individual Time Calculation (min): 40 min   Pt c/o severe L toe pain from neuropathic pain. Pt premedicated. Spoke with pt's PA about her pain c/o.  Pt very upset about being in the hospital and anxious to get home.  Discussed her current status and discharge plans.  Used distraction with conversation to assist pt with relaxing and to not focus on pain. Pt worked on AROM of R limb with hip flexion and knee extension.   Pt resting in w/c with all needs met.   Theresa 01/27/2017, 12:23 PM

## 2017-01-27 NOTE — Patient Care Conference (Signed)
Inpatient RehabilitationTeam Conference and Plan of Care Update Date: 01/28/2017   Time: 1:01 PM    Patient Name: [name]      Medical Record Number: [id number]  Date of Birth: [birth year] Sex: Female         Room/Bed: 4W26C/4W26C-01 Payor Info: Payor: MEDICARE / Plan: MEDICARE PART A AND B / Product Type: *No Product type* /    Admitting Diagnosis: BKA R  Admit Date/Time:  01/21/2017  4:47 PM Admission Comments: No comment available   Primary Diagnosis:  <principal problem not specified> Principal Problem: <principal problem not specified>  Patient Active Problem List   Diagnosis Date Noted  . Decubitus ulcer of sacral region, unstageable ([place])   . Deep tissue injury   . Hypokalemia   . Hypoalbuminemia due to protein-calorie malnutrition ([place])   . Debilitated 01/21/2017  . S/P BKA (below knee amputation) unilateral, right ([place])   . Neuropathic pain   . Post-operative pain   . Slow transit constipation   . Debility   . Ischemic leg   . Hx of right BKA ([place])   . PAF (paroxysmal atrial fibrillation) ([place])   . Anemia of chronic disease   . Chronic pain syndrome   . Fibromyalgia   . Stage 3 chronic kidney disease   . Benign essential HTN   . Abnormal urinalysis   . PVD (peripheral vascular disease) ([place]) 01/13/2017  . Post-op pain 01/03/2017  . Phantom limb pain ([place]) 01/03/2017  . Status post below knee amputation of right lower extremity ([place]) 12/30/2016  . Acute on chronic combined systolic and diastolic CHF (congestive heart failure) ([place]) 12/22/2016  . Anemia 12/22/2016  . Palliative care encounter   . Lower extremity pain, right   . Acute GI bleeding 10/23/2016  . Paroxysmal atrial fibrillation ([place]) 10/03/2016  . 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 PAD (peripheral artery disease) ([place]) 09/05/2016  . Groin pain 04/02/2015  . Aftercare following surgery of the circulatory system, [place]  12/13/2013  . Shingles 10/26/2013  . Anxiety 10/26/2013  . Acute blood loss anemia 09/05/2013  . Elbow fracture, left 09/05/2013  . Left acetabular fracture ([place]) 09/05/2013  . Ankle fracture, left 09/05/2013  . HTN (hypertension) 09/03/2013  . HLD (hyperlipidemia) 09/03/2013  . Peripheral vascular disease, unspecified 05/10/2012  . Chronic total occlusion of artery of the extremities ([place]) 01/19/2012    Expected Discharge Date: Expected Discharge Date: 01/28/17  Team Members Present: Physician leading conference: Dr. Delice Lesch Social Worker Present: Ovidio Kin, LCSW Nurse Present: Dorien Chihuahua, RN PT Present: Carney Living, PT OT Present: Cherylynn Ridges, OT PPS Coordinator present : Daiva Nakayama, RN, CRRN     Current Status/Progress Goal Weekly Team Focus  Medical    Decreased functional mobility secondary to occluded left leg bypass status post redo femoral artery exposure 01/16/2017 as well as right BKA 12/23/2016  Improve pain, transfers, mobility, safety  See above   Bowel/Bladder   patient is continent of bowel and bladder         Swallow/Nutrition/ Hydration   patient eats well         ADL's   need x 1 assist ; able to stand and pivot  Min A overall  ADL training, pt/family education, UB/LB strengthening   Mobility   x1 assist;able to stand and pivot  supervision-min A w/c level  functional transfers, w/c management, LE strengthening/ROM, activity tolerance, pt/family education   Communication  Safety/Cognition/ Behavioral Observations            Pain   neurontin is helping with neuropathic pain but patient still needs tylenol / robaxin         Skin   healing stage II on pt's lt butt; on santyl and berharts cream then foam; foam to lt heel due to blister; lt groin-lt shin: dermabond s/p fem pop; fentanyl patch lt shin            *See Care Plan and progress notes for long and short-term goals.  Barriers to Discharge: Pain, mobility, safety     Possible Resolutions to Barriers:  Improving to goals, pain better controlled, plan d/c today    Discharge Planning/Teaching Needs:    Home with husband and daughter will be staying the night for the transition home. Pt feels ready to go home today     Team Discussion:  Medically pt's pain is being managed and RN reports her sacral wound is healing, heel has foam dressing on it for protection. Pt has met PT & OT goals and feels ready to go home today. Hopes to stay there longer than last time.  Revisions to Treatment Plan:  DC 3/14   Continued Need for Acute Rehabilitation Level of Care: The patient requires daily medical management by a physician with specialized training in physical medicine and rehabilitation for the following conditions: Daily analysis of laboratory values and/or radiology reports with any subsequent need for medication adjustment of medical intervention for : Post surgical problems;Wound care problems  [name] 01/28/2017, 1:01 PM

## 2017-01-27 NOTE — Progress Notes (Addendum)
[name] PHYSICAL MEDICINE & REHABILITATION     PROGRESS NOTE  Subjective/Complaints:  [name] seen laying in bed Karen AM.  Family at bedside.  [name] slept well after falling asleep.  [name] states [name] had trouble with pain in [name] foot prior to that.      ROS: +Left foot pain. Denies CP, SOB, N/V/D.  Objective: Vital Signs: Blood pressure (!) 165/50, pulse 72, temperature 98.1 F (36.7 C), temperature source Oral, resp. rate 18, weight 46.5 kg (102 lb 8.2 oz), SpO2 96 %. No results found.  Recent Labs  01/27/17 0523  WBC 6.1  HGB 8.0*  HCT 25.2*  PLT 327    Recent Labs  01/27/17 0523  NA 136  K 3.7  CL 104  GLUCOSE 92  BUN 15  CREATININE 0.92  CALCIUM 9.1   CBG (last 3)  No results for input(s): GLUCAP in the last 72 hours.  Wt Readings from Last 3 Encounters:  01/27/17 46.5 kg (102 lb 8.2 oz)  01/21/17 54.1 kg (119 lb 4.3 oz)  01/13/17 51.3 kg (113 lb)    Physical Exam:  BP (!) 165/50 (BP Location: Left Arm)   Pulse 72   Temp 98.1 F (36.7 C) (Oral)   Resp 18   Wt 46.5 kg (102 lb 8.2 oz)   SpO2 96%   BMI 18.75 kg/m  Constitutional: [name] appears well-developed. Frail  HENT: Normocephalic and atraumatic.  Eyes: EOMI. No discharge.  Cardiovascular: RRR.  No JVD. Respiratory: Effort normal and breath sounds normal.  GI: Soft. Bowel sounds are normal.  Musculoskeletal: [name] exhibits edema and tenderness. Arthric changes b/l hands Neurological: [name] is alert and oriented.  Motor: B/l UE 4/5 RLE: HF 4+/5 LLE: 4+/5  Skin. Right BKA site with dried blood and bulbous stump.   Left lower extremity bypass site clean and dry.  Unstagable sacral ulcer (not examined today) Left foot reduced sensation to LT to toes and dorsum of foot Psychiatric: [name] has a normal mood and affect. [name] behavior is normal.  Assessment/Plan: 1. Functional deficits secondary to occluded left leg bypass status post redo femoral artery exposure 01/16/2017 as well as right BKA 12/23/2016 which  require 3+ hours per day of interdisciplinary therapy in a comprehensive inpatient rehab setting. Physiatrist is providing close team supervision and 24 hour management of active medical problems listed below. Physiatrist and rehab team continue to assess barriers to discharge/monitor [name] progress toward functional and medical goals.  Function:  Bathing Bathing position   Position: Sitting EOB  Bathing parts Body parts bathed by [name]: Front perineal area, Right upper leg, Left upper leg Body parts bathed by helper: Left lower leg  Bathing assist Assist Level: Touching or steadying assistance([name] > 75%)      Upper Body Dressing/Undressing Upper body dressing   What is the [name] wearing?: Pull over shirt/dress     Pull over shirt/dress - Perfomed by [name]: Thread/unthread right sleeve, Pull shirt over trunk, Put head through opening, Thread/unthread left sleeve Pull over shirt/dress - Perfomed by helper: Pull shirt over trunk        Upper body assist Assist Level: Supervision or verbal cues, Set up   Set up : To obtain clothing/put away  Lower Body Dressing/Undressing Lower body dressing   What is the [name] wearing?: Pants, Non-skid slipper socks, AFO     Pants- Performed by [name]: Thread/unthread right pants leg, Thread/unthread left pants leg Pants- Performed by helper: Pull pants up/down   Non-skid slipper socks- Performed  by helper: Don/doff left sock       Shoes - Performed by helper: Don/doff left shoe   AFO - Performed by helper: Don/doff left AFO (aircast)      Lower body assist Assist for lower body dressing:  (Mod A)      Toileting Toileting   Toileting steps completed by [name]: Performs perineal hygiene Toileting steps completed by helper: Adjust clothing after toileting, Adjust clothing prior to toileting Toileting Assistive Devices: Grab bar or rail  Toileting assist Assist level: Touching or steadying assistance ([name].75%)    Transfers Chair/bed transfer   Chair/bed transfer method: Lateral scoot Chair/bed transfer assist level: Touching or steadying assistance ([name] > 75%) Chair/bed transfer assistive device: Sliding board, Armrests     Locomotion Ambulation Ambulation activity did not occur: Safety/medical concerns         Wheelchair   Type: Manual Max wheelchair distance: 150 Assist Level: Supervision or verbal cues  Cognition Comprehension Comprehension assist level: Follows complex conversation/direction with extra time/assistive device  Expression Expression assist level: Expresses complex ideas: With extra time/assistive device  Social Interaction Social Interaction assist level: Interacts appropriately with others with medication or extra time (anti-anxiety, antidepressant).  Problem Solving Problem solving assist level: Solves complex 90% of the time/cues < 10% of the time  Memory Memory assist level: Recognizes or recalls 90% of the time/requires cueing < 10% of the time     Medical Problem List and Plan: 1.  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  DVT Prophylaxis/Anticoagulation: Subcutaneous Lovenox. Monitor platelet counts and any signs of bleeding 3. Pain Management: Duragesic patch 12.5 g, Robaxin and oxycodone as needed  Gabapentin increased to 300 TID on 3/12, will change to 300 BID and 600 qHS 4. Mood: Valium 2.5 mg daily at bedtime as needed 5. Neuropsych: [name] is capable of making decisions on [name] own behalf. 6. Skin/Wound Care: Routine skin checks  Santyl to unstagable sacral ulcer  Prevalon boot to DTI  Shrinker for bulbous stump 7. Fluids/Electrolytes/Nutrition: Routine I&Os 8. Acute on chronic anemia.   Hb 8.0 on 3/13  Cont to monitor 9. Atrial fibrillation. Cardiac rate controlled. Continue Plavix 10. Hypertension. Lopressor 25 mg daily,  Avapro 75 mg daily, HCTZ 6.25 mg daily.   Elevated Karen AM, otherwise, fairly well controlled  11. ?CKD. Creatinine 1.02 baseline.   Cr 0.92 on 3/13 12. History of GI bleed. Continue Protonix. 13. Constipation. Laxative assistance 14. Hypokalemia  K 3.7 on 3/13  Supplement initiated 3/8  Cont to monitor 15. Hypoalbuminemia  Supplement initiated 3/8  LOS (Days) 6 A FACE TO FACE EVALUATION WAS PERFORMED  Prince Olivier Lorie Phenix 01/27/2017 9:31 AM

## 2017-01-27 NOTE — Progress Notes (Signed)
Physical Therapy Discharge Summary  Patient Details  Name: [name] MRN: [id number] Date of Birth: [birth year]  Today's Date: 01/27/2017 PT Individual Time: 8453-6468 PT Individual Time Calculation (min): 83 min    Patient has met 6 of 6 long term goals due to improved activity tolerance, improved balance, improved postural control, increased strength, increased range of motion, decreased pain and ability to compensate for deficits.  Patient to discharge at a wheelchair level Supervision-min A. Patient's care partner is independent to provide the necessary physical assistance at discharge.  Reasons goals not met: NA  Recommendation:  Patient will benefit from ongoing skilled PT services in home health setting to continue to advance safe functional mobility, address ongoing impairments in weakness, activity tolerance, and minimize fall risk.  Equipment: No equipment provided  Reasons for discharge: treatment goals met and discharge from hospital  Patient/family agrees with progress made and goals achieved: Yes  Skilled Therapeutic Intervention Patient in wheelchair upon arrival with daughter [name]. Performed actual car transfer to [name]'s SUV using personal ramp to enter front passenger seat via slide board transfer with daughter providing safe setup and min A overall. Discussed raising height of ramp and platform to make transfer more level and obtaining longer slide board for improved safety with car transfers. Also discussed recommendation to have second person present for car transfer for safety. Patient performed slide board transfers from wheelchair <> therapy mat with supervision and setup for donning/doffing leg rests and reaching slide board with verbal cues for anterior weight shift and repositioning LLE throughout transfer. Performed bed mobility with supervision and increased time. Seated/supine BLE therex for strengthening and ROM: L ankle pumps, BLE LAQ, seated  alternating hip flexion, supine LLE SLR, glute sets, and quad sets to fatigue. Patient propelled wheelchair to room using BUE > 200 ft with supervision and increased time. Patient educated on continuing HEP handout from previous rehab stay and verbalized understanding. Patient and daughter with no further questions/concerns regarding discharge home.   PT Discharge Precautions/Restrictions Precautions Precautions: Fall Precaution Comments: pt not to push or pull with Lt elbow more than 5 lbs (s/p total elbow replacement) , R BKA, LLE painful Restrictions Weight Bearing Restrictions: Yes RLE Weight Bearing: Non weight bearing Pain Pain Assessment Pain Assessment: 0-10 Pain Score: 6  Pain Type: Acute pain Pain Location: Leg Pain Orientation: Left Pain Descriptors / Indicators: Aching Pain Onset: With Activity Pain Intervention(s): Repositioned;Rest Vision/Perception   No change from baseline  Cognition Overall Cognitive Status: Within Functional Limits for tasks assessed Sensation Sensation Light Touch: Impaired Detail Light Touch Impaired Details: Impaired LLE Stereognosis: Appears Intact Hot/Cold: Appears Intact Proprioception: Appears Intact Coordination Gross Motor Movements are Fluid and Coordinated: Yes Fine Motor Movements are Fluid and Coordinated: No Motor  Motor Motor: Within Functional Limits Motor - Discharge Observations: Generalized weakness  Mobility Bed Mobility Bed Mobility: Rolling Right;Rolling Left;Supine to Sit;Sit to Supine Rolling Right: 6: Modified independent (Device/Increase time) Rolling Left: 6: Modified independent (Device/Increase time) Supine to Sit: 5: Supervision Sit to Supine: 5: Supervision Transfers Transfers: Yes Lateral/Scoot Transfers: 5: Supervision;With slide board Locomotion  Ambulation Ambulation: No Gait Gait: No Stairs / Additional Locomotion Stairs: No Wheelchair Mobility Wheelchair Mobility: Yes Wheelchair  Assistance: 5: Careers information officer: Both upper extremities Wheelchair Parts Management: Needs assistance Distance: 150 ft  Trunk/Postural Assessment  Cervical Assessment Cervical Assessment: Within Functional Limits Thoracic Assessment Thoracic Assessment: Within Functional Limits Lumbar Assessment Lumbar Assessment: Exceptions to [facility] (Posterior pelvic tilt) Postural Control Postural Control: Within  Functional Limits  Balance Dynamic Sitting Balance Dynamic Sitting - Balance Support: Feet supported;During functional activity;Bilateral upper extremity supported Dynamic Sitting - Level of Assistance: 6: Modified independent (Device/Increase time) Extremity Assessment  RLE Assessment RLE Assessment: Within Functional Limits RLE Strength RLE Overall Strength: Deficits RLE Overall Strength Comments: grossly 4/5 LLE Assessment LLE Assessment: Exceptions to [facility] LLE Strength LLE Overall Strength: Due to pain;Deficits LLE Overall Strength Comments: grossly 3- to 4/5, absent ankle eversion   See Function Navigator for Current Functional Status.  [name], [name] 01/27/2017, 2:25 PM

## 2017-01-28 ENCOUNTER — Inpatient Hospital Stay (HOSPITAL_COMMUNITY): Payer: Medicare Other | Admitting: Physical Therapy

## 2017-01-28 LAB — CREATININE, SERUM
Creatinine, Ser: 0.91 mg/dL (ref 0.44–1.00)
GFR, EST NON AFRICAN AMERICAN: 56 mL/min — AB (ref >60)

## 2017-01-28 MED ORDER — DIAZEPAM 5 MG PO TABS: 2.5000 mg | ORAL_TABLET | Freq: Every evening | ORAL | 0 refills | Status: AC | PRN

## 2017-01-28 MED ORDER — DOCUSATE SODIUM 100 MG PO CAPS: 100.0000 mg | ORAL_CAPSULE | Freq: Every day | ORAL | 0 refills | Status: DC

## 2017-01-28 MED ORDER — GERHARDT'S BUTT CREAM: 1.0000 "application " | TOPICAL_CREAM | Freq: Two times a day (BID) | CUTANEOUS | 1 refills | Status: DC

## 2017-01-28 MED ORDER — GABAPENTIN 300 MG PO CAPS: ORAL_CAPSULE | ORAL | 1 refills | Status: DC

## 2017-01-28 MED ORDER — COLLAGENASE 250 UNIT/GM EX OINT: TOPICAL_OINTMENT | Freq: Every day | CUTANEOUS | 0 refills | Status: DC

## 2017-01-28 MED ORDER — POTASSIUM CHLORIDE CRYS ER 10 MEQ PO TBCR: 10.0000 meq | EXTENDED_RELEASE_TABLET | Freq: Every day | ORAL | 0 refills | Status: DC

## 2017-01-28 MED ORDER — FENTANYL 12 MCG/HR TD PT72: 12.5000 ug | MEDICATED_PATCH | TRANSDERMAL | 0 refills | Status: DC

## 2017-01-28 MED ORDER — METHOCARBAMOL 500 MG PO TABS: 500.0000 mg | ORAL_TABLET | Freq: Four times a day (QID) | ORAL | 0 refills | Status: DC | PRN

## 2017-01-28 MED ORDER — PANTOPRAZOLE SODIUM 40 MG PO TBEC: 40.0000 mg | DELAYED_RELEASE_TABLET | Freq: Every day | ORAL | 1 refills | Status: DC

## 2017-01-28 MED ORDER — METOPROLOL TARTRATE 25 MG PO TABS
25.0000 mg | ORAL_TABLET | Freq: Every day | ORAL | 1 refills | Status: DC
Start: 2017-01-28 — End: 2023-08-23

## 2017-01-28 MED ORDER — CLOPIDOGREL BISULFATE 75 MG PO TABS: 75.0000 mg | ORAL_TABLET | Freq: Every day | ORAL | 1 refills | Status: AC

## 2017-01-28 MED ORDER — VALSARTAN-HYDROCHLOROTHIAZIDE 160-12.5 MG PO TABS: 0.5000 | ORAL_TABLET | Freq: Every day | ORAL | 0 refills | Status: DC

## 2017-01-28 MED ORDER — OXYCODONE HCL 5 MG PO TABS: 5.0000 mg | ORAL_TABLET | ORAL | 0 refills | Status: DC | PRN

## 2017-01-28 NOTE — Progress Notes (Signed)
[place] PHYSICAL MEDICINE & REHABILITATION     PROGRESS NOTE  Subjective/Complaints:  Pt seen laying in bed this AM.  She slept well overnight.  She states her left foot still hurts, but it is more manageable.  She is eager to go home.   ROS: +Left foot pain. Denies CP, SOB, N/V/D.  Objective: Vital Signs: Blood pressure (!) 145/56, pulse 95, temperature 98.2 F (36.8 C), temperature source Oral, resp. rate 16, weight 46.5 kg (102 lb 8.2 oz), SpO2 100 %. No results found.  Recent Labs  01/27/17 0523  WBC 6.1  HGB 8.0*  HCT 25.2*  PLT 327    Recent Labs  01/27/17 0523 01/28/17 0438  NA 136  --   K 3.7  --   CL 104  --   GLUCOSE 92  --   BUN 15  --   CREATININE 0.92 0.91  CALCIUM 9.1  --    CBG (last 3)  No results for input(s): GLUCAP in the last 72 hours.  Wt Readings from Last 3 Encounters:  01/27/17 46.5 kg (102 lb 8.2 oz)  01/21/17 54.1 kg (119 lb 4.3 oz)  01/13/17 51.3 kg (113 lb)    Physical Exam:  BP (!) 145/56 (BP Location: Left Arm)   Pulse 95   Temp 98.2 F (36.8 C) (Oral)   Resp 16   Wt 46.5 kg (102 lb 8.2 oz)   SpO2 100%   BMI 18.75 kg/m  Constitutional: She appears well-developed. Frail  HENT: Normocephalic and atraumatic.  Eyes: EOMI. No discharge.  Cardiovascular: RRR.  No JVD. Respiratory: Effort normal and breath sounds normal.  GI: Soft. Bowel sounds are normal.  Musculoskeletal: She exhibits edema and tenderness. Arthric changes b/l hands Neurological: She is alert and oriented.  Motor: B/l UE 4/5 RLE: HF 4+/5 LLE: 4+/5  Skin. Right BKA site with dried blood and bulbous stump.   Left lower extremity bypass site clean and dry.  Unstagable sacral ulcer (not examined today) Left foot reduced sensation on foot Psychiatric: She has a normal mood and affect. Her behavior is normal.  Assessment/Plan: 1. Functional deficits secondary to occluded left leg bypass status post redo femoral artery exposure 01/16/2017 as well as right BKA  12/23/2016 which require 3+ hours per day of interdisciplinary therapy in a comprehensive inpatient rehab setting. Physiatrist is providing close team supervision and 24 hour management of active medical problems listed below. Physiatrist and rehab team continue to assess barriers to discharge/monitor patient progress toward functional and medical goals.  Function:  Bathing Bathing position   Position: Sitting EOB  Bathing parts Body parts bathed by patient: Right arm, Left arm, Abdomen, Chest, Front perineal area, Left upper leg, Right upper leg Body parts bathed by helper: Left lower leg, Buttocks  Bathing assist Assist Level: Touching or steadying assistance(Pt > 75%)      Upper Body Dressing/Undressing Upper body dressing   What is the patient wearing?: Pull over shirt/dress     Pull over shirt/dress - Perfomed by patient: Thread/unthread right sleeve, Put head through opening, Thread/unthread left sleeve, Pull shirt over trunk Pull over shirt/dress - Perfomed by helper: Pull shirt over trunk        Upper body assist Assist Level: Set up   Set up : To obtain clothing/put away  Lower Body Dressing/Undressing Lower body dressing   What is the patient wearing?: Pants, Shoes, AFO     Pants- Performed by patient: Thread/unthread right pants leg, Thread/unthread left pants leg  Pants- Performed by helper: Pull pants up/down   Non-skid slipper socks- Performed by helper: Don/doff left sock       Shoes - Performed by helper: Don/doff left shoe   AFO - Performed by helper: Don/doff left AFO      Lower body assist Assist for lower body dressing: Touching or steadying assistance (Pt > 75%)      Toileting Toileting   Toileting steps completed by patient: Performs perineal hygiene, Adjust clothing prior to toileting Toileting steps completed by helper: Adjust clothing after toileting Toileting Assistive Devices: Grab bar or rail  Toileting assist Assist level: Touching or  steadying assistance (Pt.75%)   Transfers Chair/bed transfer   Chair/bed transfer method: Lateral scoot Chair/bed transfer assist level: Supervision or verbal cues Chair/bed transfer assistive device: Sliding board, Armrests     Locomotion Ambulation Ambulation activity did not occur: Safety/medical concerns         Wheelchair   Type: Manual Max wheelchair distance: 150 Assist Level: Supervision or verbal cues  Cognition Comprehension Comprehension assist level: Follows complex conversation/direction with extra time/assistive device  Expression Expression assist level: Expresses complex ideas: With extra time/assistive device  Social Interaction Social Interaction assist level: Interacts appropriately with others with medication or extra time (anti-anxiety, antidepressant).  Problem Solving Problem solving assist level: Solves complex 90% of the time/cues < 10% of the time  Memory Memory assist level: Recognizes or recalls 90% of the time/requires cueing < 10% of the time     Medical Problem List and Plan: 1.  Decreased functional mobility secondary to occluded left leg bypass status post redo femoral artery exposure 01/16/2017 as well as right BKA 12/23/2016  D/c today  Will see patient for transitional care management in 1-2 weeks.  2.  DVT Prophylaxis/Anticoagulation: Subcutaneous Lovenox. Monitor platelet counts and any signs of bleeding 3. Pain Management: Duragesic patch 12.5 g, Robaxin and oxycodone as needed  Gabapentin increased to 300 TID on 3/12, will change to 300 BID and 600 qHS  Improving 4. Mood: Valium 2.5 mg daily at bedtime as needed 5. Neuropsych: This patient is capable of making decisions on her own behalf. 6. Skin/Wound Care: Routine skin checks  Santyl to unstagable sacral ulcer  Prevalon boot to DTI  Shrinker for bulbous stump 7. Fluids/Electrolytes/Nutrition: Routine I&Os 8. Acute on chronic anemia.   Hb 8.0 on 3/13  Cont to monitor 9. Atrial  fibrillation. Cardiac rate controlled. Continue Plavix 10. Hypertension. Lopressor 25 mg daily, Avapro 75 mg daily, HCTZ 6.25 mg daily.   Slightly elevated, will need to follow up as outpt for ambulatory adjustments 11. ?CKD. Creatinine 1.02 baseline.   Cr 0.91 on 3/14 12. History of GI bleed. Continue Protonix. 13. Constipation. Laxative assistance 14. Hypokalemia  K 3.7 on 3/13  Supplement initiated 3/8  Cont to monitor 15. Hypoalbuminemia  Supplement initiated 3/8  LOS (Days) 7 A FACE TO FACE EVALUATION WAS PERFORMED  Camdon Saetern Lorie Phenix 01/28/2017 11:26 AM

## 2017-01-28 NOTE — Progress Notes (Signed)
01/28/17 1240 nursing Patient discharged to home per wheelchair accompanied by NT and daughter; Discharge done by [facility]; no further questions noted.

## 2017-01-28 NOTE — Progress Notes (Signed)
Social Work [name], LCSW Social Worker Signed   Patient Care Conference Date of Service: 01/27/2017  2:38 PM      Hide copied text Hover for attribution information Inpatient RehabilitationTeam Conference and Plan of Care Update Date: 01/28/2017   Time: 1:01 PM      Patient Name: [name]      Medical Record Number: [id number]  Date of Birth: 08-30-1928 Sex: Female         Room/Bed: 4W26C/4W26C-01 Payor Info: Payor: MEDICARE / Plan: MEDICARE PART A AND B / Product Type: *No Product type* /     Admitting Diagnosis: BKA R  Admit Date/Time:  01/21/2017  4:47 PM Admission Comments: No comment available    Primary Diagnosis:  <principal problem not specified> Principal Problem: <principal problem not specified>       Patient Active Problem List    Diagnosis Date Noted  . Decubitus ulcer of sacral region, unstageable ([place])    . Deep tissue injury    . Hypokalemia    . Hypoalbuminemia due to protein-calorie malnutrition ([place])    . Debilitated 01/21/2017  . S/P BKA (below knee amputation) unilateral, right ([place])    . Neuropathic pain    . Post-operative pain    . Slow transit constipation    . Debility    . Ischemic leg    . Hx of right BKA ([place])    . PAF (paroxysmal atrial fibrillation) ([place])    . Anemia of chronic disease    . Chronic pain syndrome    . Fibromyalgia    . Stage 3 chronic kidney disease    . Benign essential HTN    . Abnormal urinalysis    . PVD (peripheral vascular disease) ([place]) 01/13/2017  . Post-op pain 01/03/2017  . Phantom limb pain ([place]) 01/03/2017  . Status post below knee amputation of right lower extremity ([place]) 12/30/2016  . Acute on chronic combined systolic and diastolic CHF (congestive heart failure) ([place]) 12/22/2016  . Anemia 12/22/2016  . Palliative care encounter    . Lower extremity pain, right    . Acute GI bleeding 10/23/2016  . Paroxysmal atrial fibrillation ([place]) 10/03/2016  . 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 PAD (peripheral artery disease) ([place]) 09/05/2016  . Groin pain 04/02/2015  . Aftercare following surgery of the circulatory system, [place] 12/13/2013  . Shingles 10/26/2013  . Anxiety 10/26/2013  . Acute blood loss anemia 09/05/2013  . Elbow fracture, left 09/05/2013  . Left acetabular fracture ([place]) 09/05/2013  . Ankle fracture, left 09/05/2013  . HTN (hypertension) 09/03/2013  . HLD (hyperlipidemia) 09/03/2013  . Peripheral vascular disease, unspecified 05/10/2012  . Chronic total occlusion of artery of the extremities ([place]) 01/19/2012      Expected Discharge Date: Expected Discharge Date: 01/28/17   Team Members Present: Physician leading conference: Dr. Delice Lesch Social Worker Present: Ovidio Kin, LCSW Nurse Present: Dorien Chihuahua, RN PT Present: Carney Living, PT OT Present: Cherylynn Ridges, OT PPS Coordinator present : Daiva Nakayama, RN, CRRN       Current Status/Progress Goal Weekly Team Focus  Medical      Decreased functional mobility secondary to occluded left leg bypass status post redo femoral artery exposure 01/16/2017 as well as right BKA 12/23/2016  Improve pain, transfers, mobility, safety  See above   Bowel/Bladder     patient is continent of bowel and bladder  Swallow/Nutrition/ Hydration     patient eats well         ADL's     need x 1 assist ; able to stand and pivot  Min A overall  ADL training, pt/family education, UB/LB strengthening   Mobility     x1 assist;able to stand and pivot  supervision-min A w/c level  functional transfers, w/c management, LE strengthening/ROM, activity tolerance, pt/family education   Communication               Safety/Cognition/ Behavioral Observations             Pain     neurontin is helping with neuropathic pain but patient still needs tylenol / robaxin         Skin     healing stage II on pt's lt butt; on santyl and berharts cream then foam;  foam to lt heel due to blister; lt groin-lt shin: dermabond s/p fem pop; fentanyl patch lt shin           *See Care Plan and progress notes for long and short-term goals.   Barriers to Discharge: Pain, mobility, safety     Possible Resolutions to Barriers:  Improving to goals, pain better controlled, plan d/c today     Discharge Planning/Teaching Needs:    Home with husband and daughter will be staying the night for the transition home. Pt feels ready to go home today     Team Discussion:  Medically pt's pain is being managed and RN reports her sacral wound is healing, heel has foam dressing on it for protection. Pt has met PT & OT goals and feels ready to go home today. Hopes to stay there longer than last time.  Revisions to Treatment Plan:  DC 3/14    Continued Need for Acute Rehabilitation Level of Care: The patient requires daily medical management by a physician with specialized training in physical medicine and rehabilitation for the following conditions: Daily analysis of laboratory values and/or radiology reports with any subsequent need for medication adjustment of medical intervention for : Post surgical problems;Wound care problems   [name] 01/28/2017, 1:01 PM       Patient ID: [name], female   DOB: Jan 28, 1928, 83 y.o.   MRN: [id number]

## 2017-01-29 ENCOUNTER — Telehealth: Payer: Self-pay | Admitting: *Deleted

## 2017-01-29 DIAGNOSIS — Z48812 Encounter for surgical aftercare following surgery on the circulatory system: Secondary | ICD-10-CM | POA: Diagnosis not present

## 2017-01-29 DIAGNOSIS — Z4781 Encounter for orthopedic aftercare following surgical amputation: Secondary | ICD-10-CM | POA: Diagnosis not present

## 2017-01-29 NOTE — Telephone Encounter (Signed)
Transitional Care call-spoke with [name] and her daughter    1. Are you/is patient experiencing any problems since coming home? Are there any questions regarding any aspect of care? No 2. Are there any questions regarding medications administration/dosing? Are meds being taken as prescribed? Patient should review meds with caller to confirm Yes 3. Have there been any falls?No 4. Has Home Health been to the house and/or have they contacted you? If not, have you tried to contact them? Can we help you contact them?Yes they have contacted and will be out at 11:30 5. Are bowels and bladder emptying properly? Are there any unexpected incontinence issues? If applicable, is patient following bowel/bladder programs? No 6. Any fevers, problems with breathing, unexpected pain?No- just nerve pain 7. Are there any skin problems or new areas of breakdown? No new problems 8. Has the patient/family member arranged specialty MD follow up (ie cardiology/neurology/renal/surgical/etc)?  Can we help arrange?No 9. Does the patient need any other services or support that we can help arrange? No, they will need someone for about 4 hours but can check with [facility] when they come out today. 10. Are caregivers following through as expected in assisting the patient? Yes 11. Has the patient quit smoking, drinking alcohol, or using drugs as recommended?N/A  Appointment 02/05/17 Thursday @ 1:40  Arrive by 1:20 for appt with Dr Posey Pronto [address] [phone]

## 2017-01-30 DIAGNOSIS — Z48812 Encounter for surgical aftercare following surgery on the circulatory system: Secondary | ICD-10-CM | POA: Diagnosis not present

## 2017-01-30 DIAGNOSIS — Z4781 Encounter for orthopedic aftercare following surgical amputation: Secondary | ICD-10-CM | POA: Diagnosis not present

## 2017-01-31 DIAGNOSIS — Z48812 Encounter for surgical aftercare following surgery on the circulatory system: Secondary | ICD-10-CM | POA: Diagnosis not present

## 2017-01-31 DIAGNOSIS — Z4781 Encounter for orthopedic aftercare following surgical amputation: Secondary | ICD-10-CM | POA: Diagnosis not present

## 2017-02-02 ENCOUNTER — Telehealth: Payer: Self-pay

## 2017-02-02 DIAGNOSIS — Z4781 Encounter for orthopedic aftercare following surgical amputation: Secondary | ICD-10-CM | POA: Diagnosis not present

## 2017-02-02 DIAGNOSIS — Z48812 Encounter for surgical aftercare following surgery on the circulatory system: Secondary | ICD-10-CM | POA: Diagnosis not present

## 2017-02-02 NOTE — Telephone Encounter (Signed)
Rec'd call from pt's [facility] RN.  Reported pt. is asking if she can shower.  Reported the pt's. Right BKA stump has an open area involving approx. 25 % of the incision.  Reported that the remaining 75% of the incision is well approximated.  Stated the pt. was in a Rehab setting for approx. 3 weeks.  Stated she applied a dry dressing to the wound today.  Advised to apply wet to dry NS gauze dressings to the right BKA wound, and to have the pt. To take sponge baths, until wound is further evaluated.  Advised will sched. for appt. this week to evaluate the wound.  Verb. Understanding.  Requested to notify pt's daughter of the appt.  Will have a Scheduler contact the pt's daughter.

## 2017-02-02 NOTE — Telephone Encounter (Signed)
Spoke with [name] re: pt's right BKA stump wound. Stated he was aware of the wound on right BKA.   Advised to continue NS wet to dry dressings, and to bring pt. into office in 2 weeks for post-op eval of left leg BPG.  Notified HH RN of plan.  Verb. Understanding.

## 2017-02-03 DIAGNOSIS — Z48812 Encounter for surgical aftercare following surgery on the circulatory system: Secondary | ICD-10-CM | POA: Diagnosis not present

## 2017-02-03 DIAGNOSIS — Z4781 Encounter for orthopedic aftercare following surgical amputation: Secondary | ICD-10-CM | POA: Diagnosis not present

## 2017-02-04 DIAGNOSIS — Z4781 Encounter for orthopedic aftercare following surgical amputation: Secondary | ICD-10-CM | POA: Diagnosis not present

## 2017-02-04 DIAGNOSIS — Z48812 Encounter for surgical aftercare following surgery on the circulatory system: Secondary | ICD-10-CM | POA: Diagnosis not present

## 2017-02-04 NOTE — Telephone Encounter (Signed)
Sched appt 02/23/17 at 2:15. Spoke to pt and pt's daughter as per pt's request to inform them of appt.

## 2017-02-05 ENCOUNTER — Encounter: Payer: Self-pay | Admitting: Physical Medicine & Rehabilitation

## 2017-02-05 ENCOUNTER — Encounter: Payer: Medicare Other | Attending: Physical Medicine & Rehabilitation | Admitting: Physical Medicine & Rehabilitation

## 2017-02-05 VITALS — BP 145/77 | HR 93

## 2017-02-05 DIAGNOSIS — Z87891 Personal history of nicotine dependence: Secondary | ICD-10-CM | POA: Diagnosis not present

## 2017-02-05 DIAGNOSIS — Z833 Family history of diabetes mellitus: Secondary | ICD-10-CM | POA: Diagnosis not present

## 2017-02-05 DIAGNOSIS — Z7902 Long term (current) use of antithrombotics/antiplatelets: Secondary | ICD-10-CM | POA: Diagnosis not present

## 2017-02-05 DIAGNOSIS — Z8673 Personal history of transient ischemic attack (TIA), and cerebral infarction without residual deficits: Secondary | ICD-10-CM | POA: Diagnosis not present

## 2017-02-05 DIAGNOSIS — F411 Generalized anxiety disorder: Secondary | ICD-10-CM

## 2017-02-05 DIAGNOSIS — I4891 Unspecified atrial fibrillation: Secondary | ICD-10-CM | POA: Insufficient documentation

## 2017-02-05 DIAGNOSIS — M797 Fibromyalgia: Secondary | ICD-10-CM | POA: Insufficient documentation

## 2017-02-05 DIAGNOSIS — E7800 Pure hypercholesterolemia, unspecified: Secondary | ICD-10-CM | POA: Diagnosis not present

## 2017-02-05 DIAGNOSIS — Z89511 Acquired absence of right leg below knee: Secondary | ICD-10-CM | POA: Diagnosis not present

## 2017-02-05 DIAGNOSIS — R531 Weakness: Secondary | ICD-10-CM | POA: Diagnosis not present

## 2017-02-05 DIAGNOSIS — Z85828 Personal history of other malignant neoplasm of skin: Secondary | ICD-10-CM | POA: Diagnosis not present

## 2017-02-05 DIAGNOSIS — K449 Diaphragmatic hernia without obstruction or gangrene: Secondary | ICD-10-CM | POA: Diagnosis not present

## 2017-02-05 DIAGNOSIS — F419 Anxiety disorder, unspecified: Secondary | ICD-10-CM | POA: Diagnosis not present

## 2017-02-05 DIAGNOSIS — K219 Gastro-esophageal reflux disease without esophagitis: Secondary | ICD-10-CM | POA: Diagnosis not present

## 2017-02-05 DIAGNOSIS — I739 Peripheral vascular disease, unspecified: Secondary | ICD-10-CM | POA: Diagnosis not present

## 2017-02-05 DIAGNOSIS — T8131XA Disruption of external operation (surgical) wound, not elsewhere classified, initial encounter: Secondary | ICD-10-CM | POA: Diagnosis not present

## 2017-02-05 DIAGNOSIS — Z89512 Acquired absence of left leg below knee: Secondary | ICD-10-CM | POA: Diagnosis not present

## 2017-02-05 DIAGNOSIS — G8929 Other chronic pain: Secondary | ICD-10-CM | POA: Insufficient documentation

## 2017-02-05 DIAGNOSIS — Z9889 Other specified postprocedural states: Secondary | ICD-10-CM | POA: Insufficient documentation

## 2017-02-05 DIAGNOSIS — R200 Anesthesia of skin: Secondary | ICD-10-CM | POA: Insufficient documentation

## 2017-02-05 DIAGNOSIS — I10 Essential (primary) hypertension: Secondary | ICD-10-CM

## 2017-02-05 DIAGNOSIS — L89150 Pressure ulcer of sacral region, unstageable: Secondary | ICD-10-CM

## 2017-02-05 DIAGNOSIS — M792 Neuralgia and neuritis, unspecified: Secondary | ICD-10-CM

## 2017-02-05 DIAGNOSIS — G8918 Other acute postprocedural pain: Secondary | ICD-10-CM

## 2017-02-05 DIAGNOSIS — Z8249 Family history of ischemic heart disease and other diseases of the circulatory system: Secondary | ICD-10-CM | POA: Insufficient documentation

## 2017-02-05 MED ORDER — GABAPENTIN 600 MG PO TABS: 600.0000 mg | ORAL_TABLET | Freq: Three times a day (TID) | ORAL | 1 refills | Status: DC

## 2017-02-05 NOTE — Addendum Note (Signed)
Addended by: Delice Lesch A on: 02/05/2017 02:21 PM   Modules accepted: Orders

## 2017-02-05 NOTE — Progress Notes (Signed)
Subjective:    Patient ID: [name], female    DOB: 19-Jul-1928, 83 y.o.   MRN: [id number]  HPI  83 year old right-handed female with history of atrial fibrillation, maintained on Plavix, GI bleed, chronic back pain, hypertension, peripheral vascular disease with femoral popliteal bypass grafting on September 24, 2016, as well as a left BKA presents for transitional care management after receiving CIR for right BKA and left femoral bypass.   DATE OF ADMISSION:  01/21/2017 DATE OF DISCHARGE:  01/28/2017  Presents with daughter who provides the majority of the history. At discharge, she was instructed to cont dressing to her sacral ulcer, which she has been complaint with.  She seen Vasc surgery in 2 weeks.  She sees PCP next week.  She states her pain is still not managed in her toes. She has been compliant with her shrinker.  Her BP is slightly elevated.  Daughter notes that her stump has opened slightly.   DME: Shower chair, bedside commode Mobility: Wheelchair at all times Therapies: 2/week PT, 1/week OT  Pain Inventory Average Pain 8 Pain Right Now 7 My pain is sharp, stabbing and shooting  In the last 24 hours, has pain interfered with the following? General activity 7 Relation with others 0 Enjoyment of life 8 What TIME of day is your pain at its worst? morning and evening Sleep (in general) Fair  Pain is worse with: unsure and some activites Pain improves with: medication Relief from Meds: 5  Mobility how many minutes can you walk? 0 ability to climb steps?  no do you drive?  no use a wheelchair needs help with transfers Do you have any goals in this area?  yes  Function retired I need assistance with the following:  dressing, bathing, toileting, meal prep, household duties and shopping  Neuro/Psych weakness numbness tremor  Prior Studies Any changes since last visit?  no  Physicians involved in your care Any changes since last visit?  no   Family  History  Problem Relation Age of Onset  . Heart disease Father     Heart Disease before age 59  . Hyperlipidemia Father   . Hypertension Father   . Alcohol abuse Father   . Heart disease Brother   . Hyperlipidemia Brother   . Hypertension Brother   . Deep vein thrombosis Brother   . Heart disease Son     Heart Disease before age 31  . Hyperlipidemia Son   . Hypertension Son   . Heart attack Son   . Diabetes Son   . Hypertension Son   . Hyperlipidemia Sister   . Hypertension Sister    Social History   Social History  . Marital status: Married    Spouse name: N/A  . Number of children: N/A  . Years of education: N/A   Social History Main Topics  . Smoking status: Former Smoker    Types: Cigarettes    Quit date: 11/17/1946  . Smokeless tobacco: Never Used     Comment: "never smoked much"  . Alcohol use No  . Drug use: No  . Sexual activity: Not on file   Other Topics Concern  . Not on file   Social History Narrative  . No narrative on file   Past Surgical History:  Procedure Laterality Date  . ABDOMINAL AORTAGRAM N/A 12/26/2014   Procedure: ABDOMINAL Maxcine Ham;  Surgeon: Serafina Mitchell, MD;  Location: [facility] CATH LAB;  Service: Cardiovascular;  Laterality: N/A;  . AMPUTATION Right  12/23/2016   Procedure: AMPUTATION BELOW KNEE;  Surgeon: Serafina Mitchell, MD;  Location: [place];  Service: Vascular;  Laterality: Right;  . AORTOGRAM N/A 09/26/2016   Procedure: AORTOGRAM;  Surgeon: Waynetta Sandy, MD;  Location: [place];  Service: Vascular;  Laterality: N/A;  . CARPAL TUNNEL RELEASE Right   . CATARACT EXTRACTION W/ INTRAOCULAR LENS  IMPLANT, BILATERAL Bilateral   . COLONOSCOPY    . DILATION AND CURETTAGE OF UTERUS    . DRESSING CHANGE UNDER ANESTHESIA Right 01/16/2017   Procedure: DRESSING CHANGE RIGHT BELOW KNEE AMPUTATION;  Surgeon: Serafina Mitchell, MD;  Location: [place];  Service: Vascular;  Laterality: Right;  . EYE SURGERY Right    "macular OR"  . FEMORAL ARTERY  STENT  12-11-10   Left SFA  . FEMORAL-POPLITEAL BYPASS GRAFT Left 09/24/2016   Procedure: REDO FEMORAL TO POPLITEAL ARTERY BYPASS GRAFT USING 6MM PROPATEN RINGED GORTEX GRAFT;  Surgeon: Serafina Mitchell, MD;  Location: [place];  Service: Vascular;  Laterality: Left;  . FEMORAL-TIBIAL BYPASS GRAFT Left 01/16/2017   Procedure: REDO BYPASS GRAFT FEMORAL-TIBIAL ARTERY WITH GORTEX GRAFT;  Surgeon: Serafina Mitchell, MD;  Location: [place];  Service: Vascular;  Laterality: Left;  AND LOWER LEG   . I&D EXTREMITY Right 12/17/2016   Procedure: IRRIGATION AND DEBRIDEMENT RIGHT FOOT;  Surgeon: Serafina Mitchell, MD;  Location: [place];  Service: Vascular;  Laterality: Right;  . INCISION AND DRAINAGE OF WOUND Left 10/25/2009   leg/notes 11/13/2009  . INSERTION OF ILIAC STENT Left 12/26/2014   Procedure: INSERTION OF ILIAC STENT;  Surgeon: Serafina Mitchell, MD;  Location: [facility] CATH LAB;  Service: Cardiovascular;  Laterality: Left;  . INSERTION OF ILIAC STENT Left 09/26/2016   Procedure: SUB INTIMAL INSERTION OF SUPERFICIAL FEMORAL ARTERY AND BELOW KNEE BYPASS GRAFT;  Surgeon: Waynetta Sandy, MD;  Location: [place];  Service: Vascular;  Laterality: Left;  . JOINT REPLACEMENT     knee  . JOINT REPLACEMENT Left Oct. 17, 2014   Elbow ( pt fell 08-31-13 )  . ORIF SHOULDER FRACTURE Right    "it was crushed"  . PERIPHERAL VASCULAR CATHETERIZATION N/A 10/30/2015   Procedure: Abdominal Aortogram w/Lower Extremity;  Surgeon: Serafina Mitchell, MD;  Location: [place] CV LAB;  Service: Cardiovascular;  Laterality: N/A;  . PERIPHERAL VASCULAR CATHETERIZATION  10/30/2015   Procedure: Peripheral Vascular Intervention;  Surgeon: Serafina Mitchell, MD;  Location: [place] CV LAB;  Service: Cardiovascular;;  . PERIPHERAL VASCULAR CATHETERIZATION N/A 04/01/2016   Procedure: Abdominal Aortogram w/Lower Extremity;  Surgeon: Serafina Mitchell, MD;  Location: [place] CV LAB;  Service: Cardiovascular;  Laterality: N/A;  . PERIPHERAL  VASCULAR CATHETERIZATION Left 04/01/2016   Procedure: Peripheral Vascular Atherectomy;  Surgeon: Serafina Mitchell, MD;  Location: [place] CV LAB;  Service: Cardiovascular;  Laterality: Left;  Superficial femoral artery.  Marland Kitchen PERIPHERAL VASCULAR CATHETERIZATION Right 09/05/2016   "stent"  . PERIPHERAL VASCULAR CATHETERIZATION N/A 09/05/2016   Procedure: Abdominal Aortogram w/Lower Extremity;  Surgeon: Serafina Mitchell, MD;  Location: [place] CV LAB;  Service: Cardiovascular;  Laterality: N/A;  . PERIPHERAL VASCULAR CATHETERIZATION Right 09/05/2016   Procedure: Peripheral Vascular Intervention;  Surgeon: Serafina Mitchell, MD;  Location: [place] CV LAB;  Service: Cardiovascular;  Laterality: Right;  Superficial Femoral  . PERIPHERAL VASCULAR CATHETERIZATION Left 09/09/2016   Procedure: Lower Extremity Angiography;  Surgeon: Serafina Mitchell, MD;  Location: [place] CV LAB;  Service: Cardiovascular;  Laterality: Left;  .  PR VEIN BYPASS GRAFT,AORTO-FEM-POP  09-13-09   Left Fem-pop  . THROMBECTOMY FEMORAL ARTERY Right 09/26/2016   Procedure: Thromboembolectomy Right Lower Extremity, Right Femoral Artery Endarterectomy with Patch Angioplasty; Right Lower Extremity Angiogram ;  Surgeon: Waynetta Sandy, MD;  Location: [place];  Service: Vascular;  Laterality: Right;  . TOTAL ELBOW ARTHROPLASTY Left 09/03/2013   Procedure: LEFT TOTAL ELBOW ARTHROPLASTY;  Surgeon: Roseanne Kaufman, MD;  Location: [place];  Service: Orthopedics;  Laterality: Left;  . TOTAL KNEE ARTHROPLASTY Left 06/2006  . TRANSMETATARSAL AMPUTATION Right 12/11/2016   Procedure: TRANSMETATARSAL AMPUTATION;  Surgeon: Serafina Mitchell, MD;  Location: [place];  Service: Vascular;  Laterality: Right;  . TUBAL LIGATION    . VAGINAL HYSTERECTOMY     Past Medical History:  Diagnosis Date  . Anemia   . Anginal pain ([place])   . Arthritis    "qwhere" (09/05/2016)  . Atrial fibrillation ([place]) 09/2016  . Chronic lower back pain   .  Complication of anesthesia    "takes a long time for it to wear off; I can hallucinate if I take too much" (09/05/2016)  . DVT (deep venous thrombosis) ([place]) 10/2009  . Fall from steps 08/31/2013   Fx. pelvis, Left Hip, Left Elbow  . Fibromyalgia   . GERD (gastroesophageal reflux disease)    09/22/16- "no too much anymore"  . GI bleed 10/24/2016  . High cholesterol   . History of blood transfusion   . History of hiatal hernia   . Hypertension   . Macular degeneration, wet ([place])    "started in right eye; now legally blind in that eye; now started in left eye but pretty much in control" (09/05/2016)  . Osteoporosis   . Peripheral vascular disease ([place])    nonviable tissue Right foot  . PONV (postoperative nausea and vomiting)   . Squamous cell carcinoma of skin of right calf Aug. 2015  . Stroke [facility])    TIA's no residual  . TIA (transient ischemic attack)    "several at once; none in a long time" (09/05/2016)   BP (!) 145/77   Pulse 93   SpO2 97%   Opioid Risk Score:   Fall Risk Score:  `1  Depression screen PHQ 2/9  Depression screen PHQ 2/9 02/05/2017  Decreased Interest 3  Down, Depressed, Hopeless 3  PHQ - 2 Score 6  Altered sleeping 1  Tired, decreased energy 3  Change in appetite 0  Feeling bad or failure about yourself  0  Trouble concentrating 3  Moving slowly or fidgety/restless 0  Suicidal thoughts 0  PHQ-9 Score 13  Difficult doing work/chores Extremely dIfficult   Review of Systems  HENT: Negative.   Eyes: Negative.   Respiratory: Negative.   Cardiovascular: Positive for leg swelling.  Gastrointestinal: Positive for constipation.  Endocrine: Negative.   Genitourinary: Negative.   Musculoskeletal: Positive for gait problem.  Skin: Positive for wound.  Allergic/Immunologic: Negative.   Neurological: Positive for tremors, weakness and numbness.  Psychiatric/Behavioral: Positive for dysphoric mood.  All other systems reviewed and are negative.        Objective:   Physical Exam Constitutional: She appears well-developed. Frail  HENT: Normocephalic and atraumatic.  Eyes: EOMI. No discharge.  Cardiovascular: RRR. No JVD. Respiratory: Effort normal and breath sounds normal.  GI: Soft. Bowel sounds are normal.  Musculoskeletal: She exhibits edema and tenderness. Arthric changes b/l hands Neurological: She is alert and oriented.  Motor: B/l UE 4+/5 RLE: HF, KE 4+/5 LLE:  4-/5 HE, 4/5 KE, 4/5 ADF/PF Sensation diminished to light touch left foot Skin. Right BKA site with small area of dehiscence and bulbous stump.   Left lower extremity bypass site clean and dry.  Psychiatric: She has a normal mood and affect. Her behavior is normal.     Assessment & Plan:  83 year old right-handed female with history of atrial fibrillation, maintained on Plavix, GI bleed, chronic back pain, hypertension, peripheral vascular disease with femoral popliteal bypass grafting on September 24, 2016, as well as a left BKA presents for transitional care management after receiving CIR for right BKA and left femoral bypass.   1. Decreased functional mobility secondary to occluded left leg bypass status post redo femoral artery exposure 01/16/2017 as well as right BKA 12/23/2016  Cont therapies  Follow up with Vasc Surgery  2.  Pain Management  Cont meds per Vascular surgery  Will increase Gabapentin increased to 600 TID   3. Anxiety  Cont Valium 2.5 mg daily at bedtime as needed  4. Wounds  Dehiscence of surgical wound  Santyl to sacral ulcer  Santyl to right BKA  5. Hypertension  Cont meds  Follow up with PCP for adjustment of meds   Meds reviewed Referrals reviewed All questions answered

## 2017-02-06 DIAGNOSIS — Z48812 Encounter for surgical aftercare following surgery on the circulatory system: Secondary | ICD-10-CM | POA: Diagnosis not present

## 2017-02-06 DIAGNOSIS — Z4781 Encounter for orthopedic aftercare following surgical amputation: Secondary | ICD-10-CM | POA: Diagnosis not present

## 2017-02-09 DIAGNOSIS — Z48812 Encounter for surgical aftercare following surgery on the circulatory system: Secondary | ICD-10-CM | POA: Diagnosis not present

## 2017-02-09 DIAGNOSIS — Z4781 Encounter for orthopedic aftercare following surgical amputation: Secondary | ICD-10-CM | POA: Diagnosis not present

## 2017-02-11 ENCOUNTER — Encounter: Payer: Self-pay | Admitting: Surgery

## 2017-02-11 DIAGNOSIS — Z48812 Encounter for surgical aftercare following surgery on the circulatory system: Secondary | ICD-10-CM | POA: Diagnosis not present

## 2017-02-11 DIAGNOSIS — Z4781 Encounter for orthopedic aftercare following surgical amputation: Secondary | ICD-10-CM | POA: Diagnosis not present

## 2017-02-13 DIAGNOSIS — Z48812 Encounter for surgical aftercare following surgery on the circulatory system: Secondary | ICD-10-CM | POA: Diagnosis not present

## 2017-02-13 DIAGNOSIS — Z4781 Encounter for orthopedic aftercare following surgical amputation: Secondary | ICD-10-CM | POA: Diagnosis not present

## 2017-02-16 DIAGNOSIS — Z4781 Encounter for orthopedic aftercare following surgical amputation: Secondary | ICD-10-CM | POA: Diagnosis not present

## 2017-02-16 DIAGNOSIS — Z48812 Encounter for surgical aftercare following surgery on the circulatory system: Secondary | ICD-10-CM | POA: Diagnosis not present

## 2017-02-17 ENCOUNTER — Ambulatory Visit
Admission: RE | Admit: 2017-02-17 | Discharge: 2017-02-17 | Disposition: A | Discharge status: Home / Self Care | Payer: Medicare Other | Source: Ambulatory Visit | Attending: Internal Medicine | Admitting: Internal Medicine

## 2017-02-17 ENCOUNTER — Other Ambulatory Visit: Payer: Self-pay | Admitting: Internal Medicine

## 2017-02-17 ENCOUNTER — Telehealth: Payer: Self-pay | Admitting: Surgery

## 2017-02-17 DIAGNOSIS — I10 Essential (primary) hypertension: Secondary | ICD-10-CM | POA: Diagnosis not present

## 2017-02-17 DIAGNOSIS — S99922A Unspecified injury of left foot, initial encounter: Secondary | ICD-10-CM

## 2017-02-17 DIAGNOSIS — M7989 Other specified soft tissue disorders: Secondary | ICD-10-CM | POA: Diagnosis not present

## 2017-02-17 DIAGNOSIS — E876 Hypokalemia: Secondary | ICD-10-CM | POA: Diagnosis not present

## 2017-02-17 DIAGNOSIS — F064 Anxiety disorder due to known physiological condition: Secondary | ICD-10-CM | POA: Diagnosis not present

## 2017-02-17 DIAGNOSIS — I739 Peripheral vascular disease, unspecified: Secondary | ICD-10-CM | POA: Diagnosis not present

## 2017-02-17 DIAGNOSIS — G894 Chronic pain syndrome: Secondary | ICD-10-CM | POA: Diagnosis not present

## 2017-02-17 DIAGNOSIS — G629 Polyneuropathy, unspecified: Secondary | ICD-10-CM | POA: Diagnosis not present

## 2017-02-17 NOTE — Telephone Encounter (Signed)
Daughter requests refill on pain medications for her mother. Sent a staff message to triage nurse/awt

## 2017-02-17 NOTE — Telephone Encounter (Addendum)
Patient's daughter called to request more Narcotic pain medications. Pt is S/P Redo of left fem-tib BPG on 01-16-17 and Right BKA done on 12-23-16. Patient was in CIR after both surgeries. She was given these 2 Rx at discharge on 01-28-17 by Lauraine Rinne, [place] but were not filled until the dates below per the [place] Database;   02/07/2017 OXYCODONE HCL 5 MG TABLET 30.00 5 [id number]   01/29/2017 FENTANYL 12 MCG/HR PATCH 10.00 30 [id number]   I left a message with her Daughter, [name], to call Dr. Posey Pronto at [facility] because we don't give long term opiates such as Fentanyl with breakthrough meds. [name] is out of town this week and won't return until 02-23-17 when patient has an appt.   I just spoke to [place] and it seems that [name] had another fall this morning and is at her PCP now being xrayed for a possible left foot sprain vs fracture. The PCP has renewed all of her medications and also renewed her BTP meds. She is still on the Fentanyl patch per [place].

## 2017-02-18 DIAGNOSIS — Z4781 Encounter for orthopedic aftercare following surgical amputation: Secondary | ICD-10-CM | POA: Diagnosis not present

## 2017-02-18 DIAGNOSIS — Z48812 Encounter for surgical aftercare following surgery on the circulatory system: Secondary | ICD-10-CM | POA: Diagnosis not present

## 2017-02-19 DIAGNOSIS — Z48812 Encounter for surgical aftercare following surgery on the circulatory system: Secondary | ICD-10-CM | POA: Diagnosis not present

## 2017-02-19 DIAGNOSIS — Z4781 Encounter for orthopedic aftercare following surgical amputation: Secondary | ICD-10-CM | POA: Diagnosis not present

## 2017-02-23 ENCOUNTER — Ambulatory Visit (INDEPENDENT_AMBULATORY_CARE_PROVIDER_SITE_OTHER): Payer: Self-pay | Admitting: Surgery

## 2017-02-23 ENCOUNTER — Encounter: Payer: Self-pay | Admitting: Surgery

## 2017-02-23 VITALS — BP 136/68 | HR 87 | Temp 97.7°F | Resp 16 | Ht 62.0 in | Wt 105.0 lb

## 2017-02-23 DIAGNOSIS — I70213 Atherosclerosis of native arteries of extremities with intermittent claudication, bilateral legs: Secondary | ICD-10-CM

## 2017-02-23 DIAGNOSIS — G629 Polyneuropathy, unspecified: Secondary | ICD-10-CM

## 2017-02-23 NOTE — Progress Notes (Signed)
POST OPERATIVE OFFICE NOTE    CC:  F/u for surgery, scabbing and drainage from the right BKA incision and continued pain in the left LE.  HPI:  This is a 83 y.o. female who is s/p REDO BYPASS GRAFT FEMORAL-TIBIAL ARTERY WITH GORTEX GRAFT DRESSING CHANGE RIGHT BELOW KNEE AMPUTATION  She was discharged from the hospital today and was elevating her leg in her recliner and had numbness in the left leg. The pain and numbness extended up to the mid calf. She presented to the emergency department. X-ray showed no evidence of a fracture to her foot as she had reportedly fallen also.  Karen Gunnels Youngis a 83 y.o.female, who [facility] a right below the knee amputation. She had multiple revascularization attempts on the right side but ultimately required a below-the-knee amputation.  She has a long history of rest pain in the left lower extremity. She has undergone multiple interventions on the left.  1. October 2010:She had an above-knee to below-knee popliteal artery bypass with vein in the left leg.  2. 12/26/2014: She had angioplasty and stenting of the left external iliac artery with angioplasty and stenting of the left superficial femoral artery.  3. 10/30/2015: She had a stent in the left superficial femoral artery and also angioplasty of an in stent stenosis in the left popliteal artery.  4. 04/01/2016: She had atherectomy of the left superficial femoral artery and popliteal artery and also drug coated balloon angioplasty of the left superficial femoral artery and popliteal artery.  5. 09/24/2016: The patient had a redo left femoral to above-knee popliteal artery bypass with a 6 mm PTFE graft. She also had left common femoral artery external iliac artery and profunda femoral endarterectomy.  6. 09/26/2016: The redo femoropopliteal bypass graft failed early and she underwent subintimal angioplasty of the left  superficial femoral artery and below-knee popliteal artery stenting with multiple viabahn grafts.   7. 09/27/2016: She had right common femoral artery endarterectomy with bovine pericardial patch angioplasty and right lower extremity thromboembolectomy.  Allergies  Allergen Reactions  . Motrin [Ibuprofen] Other (See Comments)    ADVERSE REACTION - GI BLEED  . Statins Other (See Comments)    ADVERSE REACTION MUSCLE PAIN & WEAKNESS  . Morphine And Related Other (See Comments)    HALLUCINATIONS REACTION IS SIDE EFFECT  . Oxycontin [Oxycodone Hcl] Other (See Comments)    [REACTION IS SIDE EFFECT]  > HALLUCINATIONS  . Promethazine Hcl Other (See Comments)    IV  Drug only, makes her act crazy  . Sulfa Antibiotics Nausea And Vomiting    Current Outpatient Prescriptions  Medication Sig Dispense Refill  . acetaminophen (TYLENOL) 500 MG tablet Take 1,000 mg by mouth 2 (two) times daily as needed for moderate pain or headache. Patient took this medication for her pain.    . Cholecalciferol (VITAMIN D3) 2000 UNITS TABS Take 2,000 Units by mouth at bedtime.     . clopidogrel (PLAVIX) 75 MG tablet Take 1 tablet (75 mg total) by mouth daily. 30 tablet 1  . collagenase (SANTYL) ointment Apply topically daily. Applied to sacral area daily 15 g 0  . Cyanocobalamin (VITAMIN B-12 PO) Take 1 tablet by mouth daily.    . diazepam (VALIUM) 5 MG tablet Take 0.5 tablets (2.5 mg total) by mouth at bedtime as needed for anxiety. 20 tablet 0  . docusate sodium (COLACE) 100 MG capsule Take 1 capsule (100 mg total) by mouth daily. 10 capsule  0  . fentaNYL (DURAGESIC - DOSED MCG/HR) 12 MCG/HR Place 1 patch (12.5 mcg total) onto the skin every 3 (three) days. 20 patch 0  . gabapentin (NEURONTIN) 600 MG tablet Take 1 tablet (600 mg total) by mouth 3 (three) times daily. 90 tablet 1  . Hydrocortisone (GERHARDT'S BUTT CREAM) CREA Apply 1 application topically 2 (two) times daily. 1 each 1  . loperamide (IMODIUM) 2 MG  capsule Take 2 mg by mouth as needed for diarrhea or loose stools.    . methocarbamol (ROBAXIN) 500 MG tablet Take 1 tablet (500 mg total) by mouth every 6 (six) hours as needed for muscle spasms. 60 tablet 0  . metoprolol tartrate (LOPRESSOR) 25 MG tablet Take 1 tablet (25 mg total) by mouth at bedtime. 30 tablet 1  . Multiple Vitamins-Minerals (EYE VITAMINS PO) Take 1 tablet by mouth at bedtime.     Marland Kitchen oxyCODONE (OXY IR/ROXICODONE) 5 MG immediate release tablet Take 1 tablet (5 mg total) by mouth every 4 (four) hours as needed for moderate pain, severe pain or breakthrough pain. 30 tablet 0  . pantoprazole (PROTONIX) 40 MG tablet Take 1 tablet (40 mg total) by mouth daily. 30 tablet 1  . potassium chloride (K-DUR,KLOR-CON) 10 MEQ tablet Take 1 tablet (10 mEq total) by mouth daily. 30 tablet 0  . valsartan-hydrochlorothiazide (DIOVAN-HCT) 160-12.5 MG tablet Take 0.5 tablets by mouth daily. 30 tablet 0   No current facility-administered medications for this visit.      ROS:  See HPI  Physical Exam:  Vitals:   02/23/17 1347  BP: 136/68  Pulse: 87  Resp: 16  Temp: 97.7 F (36.5 C)    Incision:  scabbed over the incision, the skin looks healthy without erythema or edema.  Slight tenderness.  Left incisions on the below the knee is healing well. Extremities:  Palpable left DP pulse, skin is healthy without breakdown.  Sensation of the left foot is absent to touch.   Heart RRR  Under clean conditions [name] debrided the incision on the right BKA.  The tissue below the scab had the appearence of fat necrosis not infection.   Skin edge bleeding and medial base bleeding.  Pressure was held for a while, then wet to dry dressing with kerlex and ace where applied. Patient remained in the office for 10 more minuets.  The dressing was clean and dry prior to the patient leaving the office.     Assessment/Plan:  This is a 83 y.o. female who is s/p: Redo left LE pass and right BKA incisional  debridement.  Wet to dry dressing daily to the right BKA incisional area.  Left LE  By pass is patent with palpable DP pulse.  She is wearing an ankle air splint on the left due to no sensation( likely nerve damage from lack of arterial flow in the past) and ankle injury prior to her most recent re do by pass.    We will refer her to a pain clinic for the necropathy.  She is currently taking Gabapentin 600 mg TID and continues to have pain that makes her syr at times.    We will schedule her to return in 4 weeks.  They know to call with any concerns prior to that follow up visit.   [name] [name] [facility] PA-C [facility] [phone]  Patient seen in conjunction with [name]

## 2017-02-24 ENCOUNTER — Encounter: Payer: Medicare Other | Admitting: Physical Medicine & Rehabilitation

## 2017-02-24 DIAGNOSIS — Z4781 Encounter for orthopedic aftercare following surgical amputation: Secondary | ICD-10-CM | POA: Diagnosis not present

## 2017-02-24 DIAGNOSIS — Z48812 Encounter for surgical aftercare following surgery on the circulatory system: Secondary | ICD-10-CM | POA: Diagnosis not present

## 2017-02-25 DIAGNOSIS — Z48812 Encounter for surgical aftercare following surgery on the circulatory system: Secondary | ICD-10-CM | POA: Diagnosis not present

## 2017-02-25 DIAGNOSIS — Z4781 Encounter for orthopedic aftercare following surgical amputation: Secondary | ICD-10-CM | POA: Diagnosis not present

## 2017-03-03 DIAGNOSIS — Z4781 Encounter for orthopedic aftercare following surgical amputation: Secondary | ICD-10-CM | POA: Diagnosis not present

## 2017-03-03 DIAGNOSIS — Z48812 Encounter for surgical aftercare following surgery on the circulatory system: Secondary | ICD-10-CM | POA: Diagnosis not present

## 2017-03-04 DIAGNOSIS — Z48812 Encounter for surgical aftercare following surgery on the circulatory system: Secondary | ICD-10-CM | POA: Diagnosis not present

## 2017-03-04 DIAGNOSIS — Z4781 Encounter for orthopedic aftercare following surgical amputation: Secondary | ICD-10-CM | POA: Diagnosis not present

## 2017-03-10 DIAGNOSIS — Z48812 Encounter for surgical aftercare following surgery on the circulatory system: Secondary | ICD-10-CM | POA: Diagnosis not present

## 2017-03-10 DIAGNOSIS — Z4781 Encounter for orthopedic aftercare following surgical amputation: Secondary | ICD-10-CM | POA: Diagnosis not present

## 2017-03-11 DIAGNOSIS — Z48812 Encounter for surgical aftercare following surgery on the circulatory system: Secondary | ICD-10-CM | POA: Diagnosis not present

## 2017-03-11 DIAGNOSIS — Z4781 Encounter for orthopedic aftercare following surgical amputation: Secondary | ICD-10-CM | POA: Diagnosis not present

## 2017-03-18 ENCOUNTER — Encounter: Payer: Self-pay | Admitting: Physical Medicine & Rehabilitation

## 2017-03-18 ENCOUNTER — Encounter: Payer: Medicare Other | Attending: Physical Medicine & Rehabilitation | Admitting: Physical Medicine & Rehabilitation

## 2017-03-18 VITALS — BP 149/75 | HR 80

## 2017-03-18 DIAGNOSIS — R200 Anesthesia of skin: Secondary | ICD-10-CM | POA: Insufficient documentation

## 2017-03-18 DIAGNOSIS — I70229 Atherosclerosis of native arteries of extremities with rest pain, unspecified extremity: Secondary | ICD-10-CM | POA: Diagnosis not present

## 2017-03-18 DIAGNOSIS — Z833 Family history of diabetes mellitus: Secondary | ICD-10-CM | POA: Insufficient documentation

## 2017-03-18 DIAGNOSIS — Z7902 Long term (current) use of antithrombotics/antiplatelets: Secondary | ICD-10-CM | POA: Diagnosis not present

## 2017-03-18 DIAGNOSIS — Z8673 Personal history of transient ischemic attack (TIA), and cerebral infarction without residual deficits: Secondary | ICD-10-CM | POA: Insufficient documentation

## 2017-03-18 DIAGNOSIS — K219 Gastro-esophageal reflux disease without esophagitis: Secondary | ICD-10-CM | POA: Insufficient documentation

## 2017-03-18 DIAGNOSIS — F419 Anxiety disorder, unspecified: Secondary | ICD-10-CM | POA: Insufficient documentation

## 2017-03-18 DIAGNOSIS — T8131XA Disruption of external operation (surgical) wound, not elsewhere classified, initial encounter: Secondary | ICD-10-CM | POA: Insufficient documentation

## 2017-03-18 DIAGNOSIS — Z89511 Acquired absence of right leg below knee: Secondary | ICD-10-CM | POA: Insufficient documentation

## 2017-03-18 DIAGNOSIS — Z9889 Other specified postprocedural states: Secondary | ICD-10-CM | POA: Insufficient documentation

## 2017-03-18 DIAGNOSIS — E7800 Pure hypercholesterolemia, unspecified: Secondary | ICD-10-CM | POA: Diagnosis not present

## 2017-03-18 DIAGNOSIS — G8929 Other chronic pain: Secondary | ICD-10-CM | POA: Insufficient documentation

## 2017-03-18 DIAGNOSIS — Z89512 Acquired absence of left leg below knee: Secondary | ICD-10-CM | POA: Insufficient documentation

## 2017-03-18 DIAGNOSIS — M797 Fibromyalgia: Secondary | ICD-10-CM | POA: Diagnosis not present

## 2017-03-18 DIAGNOSIS — I4891 Unspecified atrial fibrillation: Secondary | ICD-10-CM | POA: Diagnosis not present

## 2017-03-18 DIAGNOSIS — Z85828 Personal history of other malignant neoplasm of skin: Secondary | ICD-10-CM | POA: Insufficient documentation

## 2017-03-18 DIAGNOSIS — I10 Essential (primary) hypertension: Secondary | ICD-10-CM | POA: Insufficient documentation

## 2017-03-18 DIAGNOSIS — I739 Peripheral vascular disease, unspecified: Secondary | ICD-10-CM | POA: Diagnosis not present

## 2017-03-18 DIAGNOSIS — K449 Diaphragmatic hernia without obstruction or gangrene: Secondary | ICD-10-CM | POA: Insufficient documentation

## 2017-03-18 DIAGNOSIS — R531 Weakness: Secondary | ICD-10-CM | POA: Insufficient documentation

## 2017-03-18 DIAGNOSIS — Z8249 Family history of ischemic heart disease and other diseases of the circulatory system: Secondary | ICD-10-CM | POA: Insufficient documentation

## 2017-03-18 DIAGNOSIS — Z87891 Personal history of nicotine dependence: Secondary | ICD-10-CM | POA: Diagnosis not present

## 2017-03-18 MED ORDER — GABAPENTIN 600 MG PO TABS: 600.0000 mg | ORAL_TABLET | ORAL | 1 refills | Status: DC

## 2017-03-18 NOTE — Progress Notes (Signed)
Subjective:    Patient ID: [name], female    DOB: 1927/12/22, 83 y.o.   MRN: [id number]  HPI   Mrs Duba is here in follow up of her right BKA and left BPG/redo femoral artery exposure 01/16/17. She has had ongoing pain and numbness in the left lower extremity. Also the right BKA has been slow to heal and still requires dressing  She is taking oxycodone 5mg  q6 hr prn (depending upon the day), fentanyl patch 79mcg, and gabapentin 600mg  q8. She feels that the gabapentin has helped with her overall pain as she's not needing the oxycodone as much as she did before.  She has been limited in activity due to pain and numbness in her left foot/leg. The left ankle has tended to turn when she stands and an air cast has been used for support.   Mrs. Cardiff's daughter is with her and is very supportive providing daily support and dressing changes for her mother.    Pain Inventory Average Pain 7 Pain Right Now 7 My pain is constant  In the last 24 hours, has pain interfered with the following? General activity 6 Relation with others 5 Enjoyment of life 9 What TIME of day is your pain at its worst? evening Sleep (in general) Fair  Pain is worse with: unsure Pain improves with: rest Relief from Meds: 5  Mobility use a wheelchair needs help with transfers  Function retired  Neuro/Psych bladder control problems weakness numbness tingling depression suicidal thoughts  Prior Studies Any changes since last visit?  no  Physicians involved in your care Any changes since last visit?  no   Family History  Problem Relation Age of Onset  . Heart disease Father     Heart Disease before age 47  . Hyperlipidemia Father   . Hypertension Father   . Alcohol abuse Father   . Heart disease Brother   . Hyperlipidemia Brother   . Hypertension Brother   . Deep vein thrombosis Brother   . Heart disease Son     Heart Disease before age 7  . Hyperlipidemia Son   . Hypertension  Son   . Heart attack Son   . Diabetes Son   . Hypertension Son   . Hyperlipidemia Sister   . Hypertension Sister    Social History   Social History  . Marital status: Married    Spouse name: N/A  . Number of children: N/A  . Years of education: N/A   Social History Main Topics  . Smoking status: Former Smoker    Types: Cigarettes    Quit date: 11/17/1946  . Smokeless tobacco: Never Used     Comment: "never smoked much"  . Alcohol use No  . Drug use: No  . Sexual activity: Not on file   Other Topics Concern  . Not on file   Social History Narrative  . No narrative on file   Past Surgical History:  Procedure Laterality Date  . ABDOMINAL AORTAGRAM N/A 12/26/2014   Procedure: ABDOMINAL Maxcine Ham;  Surgeon: Serafina Mitchell, MD;  Location: [facility] CATH LAB;  Service: Cardiovascular;  Laterality: N/A;  . AMPUTATION Right 12/23/2016   Procedure: AMPUTATION BELOW KNEE;  Surgeon: Serafina Mitchell, MD;  Location: [place];  Service: Vascular;  Laterality: Right;  . AORTOGRAM N/A 09/26/2016   Procedure: AORTOGRAM;  Surgeon: Waynetta Sandy, MD;  Location: Home;  Service: Vascular;  Laterality: N/A;  . CARPAL TUNNEL RELEASE Right   .  CATARACT EXTRACTION W/ INTRAOCULAR LENS  IMPLANT, BILATERAL Bilateral   . COLONOSCOPY    . DILATION AND CURETTAGE OF UTERUS    . DRESSING CHANGE UNDER ANESTHESIA Right 01/16/2017   Procedure: DRESSING CHANGE RIGHT BELOW KNEE AMPUTATION;  Surgeon: Serafina Mitchell, MD;  Location: [place];  Service: Vascular;  Laterality: Right;  . EYE SURGERY Right    "macular OR"  . FEMORAL ARTERY STENT  12-11-10   Left SFA  . FEMORAL-POPLITEAL BYPASS GRAFT Left 09/24/2016   Procedure: REDO FEMORAL TO POPLITEAL ARTERY BYPASS GRAFT USING 6MM PROPATEN RINGED GORTEX GRAFT;  Surgeon: Serafina Mitchell, MD;  Location: [place];  Service: Vascular;  Laterality: Left;  . FEMORAL-TIBIAL BYPASS GRAFT Left 01/16/2017   Procedure: REDO BYPASS GRAFT FEMORAL-TIBIAL ARTERY WITH GORTEX GRAFT;   Surgeon: Serafina Mitchell, MD;  Location: [place];  Service: Vascular;  Laterality: Left;  AND LOWER LEG   . I&D EXTREMITY Right 12/17/2016   Procedure: IRRIGATION AND DEBRIDEMENT RIGHT FOOT;  Surgeon: Serafina Mitchell, MD;  Location: [place];  Service: Vascular;  Laterality: Right;  . INCISION AND DRAINAGE OF WOUND Left 10/25/2009   leg/notes 11/13/2009  . INSERTION OF ILIAC STENT Left 12/26/2014   Procedure: INSERTION OF ILIAC STENT;  Surgeon: Serafina Mitchell, MD;  Location: [facility] CATH LAB;  Service: Cardiovascular;  Laterality: Left;  . INSERTION OF ILIAC STENT Left 09/26/2016   Procedure: SUB INTIMAL INSERTION OF SUPERFICIAL FEMORAL ARTERY AND BELOW KNEE BYPASS GRAFT;  Surgeon: Waynetta Sandy, MD;  Location: [place];  Service: Vascular;  Laterality: Left;  . JOINT REPLACEMENT     knee  . JOINT REPLACEMENT Left Oct. 17, 2014   Elbow ( pt fell 08-31-13 )  . ORIF SHOULDER FRACTURE Right    "it was crushed"  . PERIPHERAL VASCULAR CATHETERIZATION N/A 10/30/2015   Procedure: Abdominal Aortogram w/Lower Extremity;  Surgeon: Serafina Mitchell, MD;  Location: [place] CV LAB;  Service: Cardiovascular;  Laterality: N/A;  . PERIPHERAL VASCULAR CATHETERIZATION  10/30/2015   Procedure: Peripheral Vascular Intervention;  Surgeon: Serafina Mitchell, MD;  Location: [place] CV LAB;  Service: Cardiovascular;;  . PERIPHERAL VASCULAR CATHETERIZATION N/A 04/01/2016   Procedure: Abdominal Aortogram w/Lower Extremity;  Surgeon: Serafina Mitchell, MD;  Location: [place] CV LAB;  Service: Cardiovascular;  Laterality: N/A;  . PERIPHERAL VASCULAR CATHETERIZATION Left 04/01/2016   Procedure: Peripheral Vascular Atherectomy;  Surgeon: Serafina Mitchell, MD;  Location: [place] CV LAB;  Service: Cardiovascular;  Laterality: Left;  Superficial femoral artery.  Marland Kitchen PERIPHERAL VASCULAR CATHETERIZATION Right 09/05/2016   "stent"  . PERIPHERAL VASCULAR CATHETERIZATION N/A 09/05/2016   Procedure: Abdominal Aortogram w/Lower  Extremity;  Surgeon: Serafina Mitchell, MD;  Location: [place] CV LAB;  Service: Cardiovascular;  Laterality: N/A;  . PERIPHERAL VASCULAR CATHETERIZATION Right 09/05/2016   Procedure: Peripheral Vascular Intervention;  Surgeon: Serafina Mitchell, MD;  Location: [place] CV LAB;  Service: Cardiovascular;  Laterality: Right;  Superficial Femoral  . PERIPHERAL VASCULAR CATHETERIZATION Left 09/09/2016   Procedure: Lower Extremity Angiography;  Surgeon: Serafina Mitchell, MD;  Location: [place] CV LAB;  Service: Cardiovascular;  Laterality: Left;  . PR VEIN BYPASS GRAFT,AORTO-FEM-POP  09-13-09   Left Fem-pop  . THROMBECTOMY FEMORAL ARTERY Right 09/26/2016   Procedure: Thromboembolectomy Right Lower Extremity, Right Femoral Artery Endarterectomy with Patch Angioplasty; Right Lower Extremity Angiogram ;  Surgeon: Waynetta Sandy, MD;  Location: [place];  Service: Vascular;  Laterality: Right;  . TOTAL  ELBOW ARTHROPLASTY Left 09/03/2013   Procedure: LEFT TOTAL ELBOW ARTHROPLASTY;  Surgeon: Roseanne Kaufman, MD;  Location: [place];  Service: Orthopedics;  Laterality: Left;  . TOTAL KNEE ARTHROPLASTY Left 06/2006  . TRANSMETATARSAL AMPUTATION Right 12/11/2016   Procedure: TRANSMETATARSAL AMPUTATION;  Surgeon: Serafina Mitchell, MD;  Location: [place];  Service: Vascular;  Laterality: Right;  . TUBAL LIGATION    . VAGINAL HYSTERECTOMY     Past Medical History:  Diagnosis Date  . Anemia   . Anginal pain ([place])   . Arthritis    "qwhere" (09/05/2016)  . Atrial fibrillation ([place]) 09/2016  . Chronic lower back pain   . Complication of anesthesia    "takes a long time for it to wear off; I can hallucinate if I take too much" (09/05/2016)  . DVT (deep venous thrombosis) ([place]) 10/2009  . Fall from steps 08/31/2013   Fx. pelvis, Left Hip, Left Elbow  . Fibromyalgia   . GERD (gastroesophageal reflux disease)    09/22/16- "no too much anymore"  . GI bleed 10/24/2016  . High cholesterol   . History of  blood transfusion   . History of hiatal hernia   . Hypertension   . Macular degeneration, wet ([place])    "started in right eye; now legally blind in that eye; now started in left eye but pretty much in control" (09/05/2016)  . Osteoporosis   . Peripheral vascular disease ([place])    nonviable tissue Right foot  . PONV (postoperative nausea and vomiting)   . Squamous cell carcinoma of skin of right calf Aug. 2015  . Stroke ([place])    TIA's no residual  . TIA (transient ischemic attack)    "several at once; none in a long time" (09/05/2016)   There were no vitals taken for this visit.  Opioid Risk Score:   Fall Risk Score:  `1  Depression screen PHQ 2/9  Depression screen PHQ 2/9 02/05/2017  Decreased Interest 3  Down, Depressed, Hopeless 3  PHQ - 2 Score 6  Altered sleeping 1  Tired, decreased energy 3  Change in appetite 0  Feeling bad or failure about yourself  0  Trouble concentrating 3  Moving slowly or fidgety/restless 0  Suicidal thoughts 0  PHQ-9 Score 13  Difficult doing work/chores Extremely dIfficult    Review of Systems  Constitutional: Negative.   HENT: Negative.   Eyes: Negative.   Respiratory: Negative.   Cardiovascular: Negative.   Gastrointestinal: Negative.   Endocrine: Negative.   Genitourinary: Negative.   Musculoskeletal: Positive for joint swelling.  Skin: Negative.   Allergic/Immunologic: Negative.   Neurological: Negative.   Hematological: Negative.   Psychiatric/Behavioral: Negative.   All other systems reviewed and are negative.      Objective:   Physical Exam  Constitutional: She appears well-developed. Frail HENT: Normocephalic and atraumatic.  Eyes: EOMI. No discharge.  Cardiovascular: RRR. Respiratory: normal effort.  GI: Soft. Bowel sounds are normal.  Musculoskeletal: She exhibits edemaand tenderness. Arthric changes b/l hands Neurological: She is alert and oriented.  Motor: B/l UE 4+/5 RLE: HF, KE 4+/5 LLE: 4-/5 HE, 4/5 KE,  3-4/5 ADF/PF due to pain Sensation diminished to light touch left foot Skin. Surgical incision healing by secondary intention with increased granulation tissue. Wet to dry dressing being used. Distal limb sensitive to touch.   Left lower extremity bypass site clean and dry. Left leg/ankle somewhat sensitive to touch.  Psychiatric: She has a normal mood and affect. Her behavior is normal.  Assessment & Plan:  83 year old right-handed female with history of atrial fibrillation, maintained on Plavix, GI bleed, chronic back pain, hypertension, peripheral vascular disease with femoral popliteal bypass grafting on September 24, 2016, as well as a left BKA presents for transitional care management after receiving CIR for right BKA and left femoral bypass.   1. Decreased functional mobility secondary to occluded left leg bypass status post redo femoral artery exposure 01/16/2017 as well as right BKA 12/23/2016             Cont therapies at home             Follow up with Vasc Surgery in 10 days  2. Pain Management             limit oxycodone as possible. Continue fentanyl---per primary             Will increase Gabapentin increased to 600 bid and 900-1200mg  at night   -daughter will watch closely for sedation/SE's  -discussed massage and visual feed back to help with phantom limb pain  -weight bearing as much as possible LLE  3. Anxiety             Cont Valium 2.5 mg daily at bedtime as needed  4. Wounds             wet-to-dry and ace, increase to bid dressing changes to help with debridement  -f/u with vascular surgery  5. Hypertension             Cont meds             Follow up with PCP for adjustment of meds  Fifteen minutes of face to face patient care time were spent during this visit. All questions were encouraged and answered.  Follow up in about 6 weeks.

## 2017-03-18 NOTE — Patient Instructions (Signed)
Massage and use visual feedback for right leg phantom symptoms

## 2017-03-19 DIAGNOSIS — Z48812 Encounter for surgical aftercare following surgery on the circulatory system: Secondary | ICD-10-CM | POA: Diagnosis not present

## 2017-03-19 DIAGNOSIS — Z4781 Encounter for orthopedic aftercare following surgical amputation: Secondary | ICD-10-CM | POA: Diagnosis not present

## 2017-03-25 ENCOUNTER — Encounter: Payer: Self-pay | Admitting: Surgery

## 2017-03-26 DIAGNOSIS — Z4781 Encounter for orthopedic aftercare following surgical amputation: Secondary | ICD-10-CM | POA: Diagnosis not present

## 2017-03-26 DIAGNOSIS — Z48812 Encounter for surgical aftercare following surgery on the circulatory system: Secondary | ICD-10-CM | POA: Diagnosis not present

## 2017-03-30 ENCOUNTER — Other Ambulatory Visit: Payer: Self-pay | Admitting: Surgery

## 2017-03-30 ENCOUNTER — Encounter: Payer: Self-pay | Admitting: Surgery

## 2017-03-30 ENCOUNTER — Ambulatory Visit (INDEPENDENT_AMBULATORY_CARE_PROVIDER_SITE_OTHER): Payer: Self-pay | Admitting: Surgery

## 2017-03-30 VITALS — BP 147/72 | HR 78 | Temp 97.7°F | Resp 16 | Ht 62.0 in | Wt 110.0 lb

## 2017-03-30 DIAGNOSIS — I70229 Atherosclerosis of native arteries of extremities with rest pain, unspecified extremity: Secondary | ICD-10-CM

## 2017-03-30 DIAGNOSIS — Z89511 Acquired absence of right leg below knee: Secondary | ICD-10-CM

## 2017-03-30 DIAGNOSIS — I70235 Atherosclerosis of native arteries of right leg with ulceration of other part of foot: Secondary | ICD-10-CM

## 2017-03-30 DIAGNOSIS — R5381 Other malaise: Secondary | ICD-10-CM

## 2017-03-30 DIAGNOSIS — I130 Hypertensive heart and chronic kidney disease with heart failure and stage 1 through stage 4 chronic kidney disease, or unspecified chronic kidney disease: Secondary | ICD-10-CM | POA: Diagnosis not present

## 2017-03-30 DIAGNOSIS — I70422 Atherosclerosis of autologous vein bypass graft(s) of the extremities with rest pain, left leg: Secondary | ICD-10-CM

## 2017-03-30 DIAGNOSIS — Z4781 Encounter for orthopedic aftercare following surgical amputation: Secondary | ICD-10-CM | POA: Diagnosis not present

## 2017-03-30 NOTE — Progress Notes (Signed)
[facility] [place]  Patient name: [name] MRN: [id number] DOB: [birth year] Sex: female   REASON FOR VISIT:    Follow up  Karen:   KENIA [name] a 83 y.o.female, who [facility] after undergoing a right below the knee amputation. She had multiple revascularization attempts on the right side but ultimately required a below-the-knee amputation.  She has a long history of rest pain in the left lower extremity. She has undergone multiple interventions on the left.  1. October 2010:She had an above-knee to below-knee popliteal artery bypass with vein in the left leg.  2. 12/26/2014: She had angioplasty and stenting of the left external iliac artery with angioplasty and stenting of the left superficial femoral artery.  3. 10/30/2015: She had a stent in the left superficial femoral artery and also angioplasty of an in stent stenosis in the left popliteal artery.  4. 04/01/2016: She had atherectomy of the left superficial femoral artery and popliteal artery and also drug coated balloon angioplasty of the left superficial femoral artery and popliteal artery.  5. 09/24/2016: The patient had a redo left femoral to above-knee popliteal artery bypass with a 6 mm PTFE graft. She also had left common femoral artery external iliac artery and profunda femoral endarterectomy.  6. 09/26/2016: The redo femoropopliteal bypass graft failed early and she underwent subintimal angioplasty of the left superficial femoral artery and below-knee popliteal artery stenting with multiple viabahn grafts.   7. 09/27/2016: She had right common femoral artery endarterectomy with bovine pericardial patch angioplasty and right lower extremity thromboembolectomy.  8.  12-23-2016:  Right BKA  9.  Left profunda to AT BPG with PTFE    She is doing better at home.  She is  still performing dressing changes to her right below-knee amputation.  She still cannot put weight on her left leg.   PAST MEDICAL HISTORY:   Past Medical History:  Diagnosis Date  . Anemia   . Anginal pain ([place])   . Arthritis    "qwhere" (09/05/2016)  . Atrial fibrillation ([place]) 09/2016  . Chronic lower back pain   . Complication of anesthesia    "takes a long time for it to wear off; I can hallucinate if I take too much" (09/05/2016)  . DVT (deep venous thrombosis) ([place]) 10/2009  . Fall from steps 08/31/2013   Fx. pelvis, Left Hip, Left Elbow  . Fibromyalgia   . GERD (gastroesophageal reflux disease)    09/22/16- "no too much anymore"  . GI bleed 10/24/2016  . High cholesterol   . History of blood transfusion   . History of hiatal hernia   . Hypertension   . Macular degeneration, wet ([place])    "started in right eye; now legally blind in that eye; now started in left eye but pretty much in control" (09/05/2016)  . Osteoporosis   . Peripheral vascular disease ([place])    nonviable tissue Right foot  . PONV (postoperative nausea and vomiting)   . Squamous cell carcinoma of skin of right calf Aug. 2015  . Stroke ([place])    TIA's no residual  . TIA (transient ischemic attack)    "several at once; none in a long time" (09/05/2016)     FAMILY HISTORY:   Family History  Problem Relation Age of Onset  . Heart disease Father        Heart Disease before age 10  . Hyperlipidemia Father   .  Hypertension Father   . Alcohol abuse Father   . Heart disease Brother   . Hyperlipidemia Brother   . Hypertension Brother   . Deep vein thrombosis Brother   . Heart disease Son        Heart Disease before age 40  . Hyperlipidemia Son   . Hypertension Son   . Heart attack Son   . Diabetes Son   . Hypertension Son   . Hyperlipidemia Sister   . Hypertension Sister     SOCIAL HISTORY:   Social History  Substance Use Topics  . Smoking status: Former Smoker    Types: Cigarettes     Quit date: 11/17/1946  . Smokeless tobacco: Never Used     Comment: "never smoked much"  . Alcohol use No     ALLERGIES:   Allergies  Allergen Reactions  . Motrin [Ibuprofen] Other (See Comments)    ADVERSE REACTION - GI BLEED  . Statins Other (See Comments)    ADVERSE REACTION MUSCLE PAIN & WEAKNESS  . Morphine And Related Other (See Comments)    HALLUCINATIONS REACTION IS SIDE EFFECT  . Oxycontin [Oxycodone Hcl] Other (See Comments)    [REACTION IS SIDE EFFECT]  > HALLUCINATIONS  . Promethazine Hcl Other (See Comments)    IV  Drug only, makes her act crazy  . Sulfa Antibiotics Nausea And Vomiting     CURRENT MEDICATIONS:   Current Outpatient Prescriptions  Medication Sig Dispense Refill  . acetaminophen (TYLENOL) 500 MG tablet Take 1,000 mg by mouth 2 (two) times daily as needed for moderate pain or headache. Patient took this medication for her pain.    . Cholecalciferol (VITAMIN D3) 2000 UNITS TABS Take 2,000 Units by mouth at bedtime.     . clopidogrel (PLAVIX) 75 MG tablet Take 1 tablet (75 mg total) by mouth daily. 30 tablet 1  . collagenase (SANTYL) ointment Apply topically daily. Applied to sacral area daily 15 g 0  . Cyanocobalamin (VITAMIN B-12 PO) Take 1 tablet by mouth daily.    . diazepam (VALIUM) 5 MG tablet Take 0.5 tablets (2.5 mg total) by mouth at bedtime as needed for anxiety. 20 tablet 0  . docusate sodium (COLACE) 100 MG capsule Take 1 capsule (100 mg total) by mouth daily. 10 capsule 0  . fentaNYL (DURAGESIC - DOSED MCG/HR) 12 MCG/HR Place 1 patch (12.5 mcg total) onto the skin every 3 (three) days. 20 patch 0  . gabapentin (NEURONTIN) 600 MG tablet Take 1 tablet (600 mg total) by mouth as directed. 1 tablet twice daily and 1.5 to 2 tablets at night 120 tablet 1  . Hydrocortisone (GERHARDT'S BUTT CREAM) CREA Apply 1 application topically 2 (two) times daily. 1 each 1  . loperamide (IMODIUM) 2 MG capsule Take 2 mg by mouth as needed for diarrhea or loose  stools.    . methocarbamol (ROBAXIN) 500 MG tablet Take 1 tablet (500 mg total) by mouth every 6 (six) hours as needed for muscle spasms. 60 tablet 0  . metoprolol tartrate (LOPRESSOR) 25 MG tablet Take 1 tablet (25 mg total) by mouth at bedtime. 30 tablet 1  . Multiple Vitamins-Minerals (EYE VITAMINS PO) Take 1 tablet by mouth at bedtime.     Marland Kitchen oxyCODONE (OXY IR/ROXICODONE) 5 MG immediate release tablet Take 1 tablet (5 mg total) by mouth every 4 (four) hours as needed for moderate pain, severe pain or breakthrough pain. 30 tablet 0  . pantoprazole (PROTONIX) 40 MG tablet Take 1 tablet (  40 mg total) by mouth daily. 30 tablet 1  . potassium chloride (K-DUR,KLOR-CON) 10 MEQ tablet Take 1 tablet (10 mEq total) by mouth daily. 30 tablet 0  . valsartan-hydrochlorothiazide (DIOVAN-HCT) 160-12.5 MG tablet Take 0.5 tablets by mouth daily. 30 tablet 0   No current facility-administered medications for this visit.     REVIEW OF SYSTEMS:   [X]  denotes positive finding, [ ]  denotes negative finding Cardiac  Comments:  Chest pain or chest pressure:    Shortness of breath upon exertion:    Short of breath when lying flat:    Irregular heart rhythm:        Vascular    Pain in calf, thigh, or hip brought on by ambulation: x   Pain in feet at night that wakes you up from your sleep:  x   Blood clot in your veins:    Leg swelling:  x       Pulmonary    Oxygen at home:    Productive cough:     Wheezing:         Neurologic    Sudden weakness in arms or legs:     Sudden numbness in arms or legs:     Sudden onset of difficulty speaking or slurred speech:    Temporary loss of vision in one eye:     Problems with dizziness:         Gastrointestinal    Blood in stool:     Vomited blood:         Genitourinary    Burning when urinating:     Blood in urine:        Psychiatric    Major depression:         Hematologic    Bleeding problems:    Problems with blood clotting too easily:          Skin    Rashes or ulcers: x       Constitutional    Fever or chills:      PHYSICAL EXAM:   Vitals:   03/30/17 1016  BP: (!) 147/72  Pulse: 78  Resp: 16  Temp: 97.7 F (36.5 C)  TempSrc: Oral  SpO2: 98%  Weight: 110 lb (49.9 kg)  Height: 5\' 2"  (1.575 m)    GENERAL: The patient is a well-nourished female, in no acute distress. The vital signs are documented above. CARDIAC: There is a regular rate and rhythm.  VASCULAR   Palpable left dorsalis pedis pulse PULMONARY: Non-labored respirations MUSCULOSKELETAL: There are no major deformities or cyanosis. NEUROLOGIC: No focal weakness or paresthesias are detected. SKIN: The right below-knee amputation stump was debrided with scissors and a 10 blade until I got back to healthy bleeding PSYCHIATRIC: The patient has a normal affect.  STUDIES:   None  MEDICAL ISSUES:   I am reordering home PT.  The patient is having difficulty putting any weight on her left leg.  All she has is an air cast now I wonder if she could benefit from a more substantial brace so that she can at least do transfers on the leg  Right BKA is slowly healing.  We will continue with wound care and dressing changes.  Follow-up in 6 weeks  Pain medication managed by Dr. Ina Kick, MD [facility] [phone] Pager [phone]

## 2017-04-02 DIAGNOSIS — I130 Hypertensive heart and chronic kidney disease with heart failure and stage 1 through stage 4 chronic kidney disease, or unspecified chronic kidney disease: Secondary | ICD-10-CM | POA: Diagnosis not present

## 2017-04-02 DIAGNOSIS — Z4781 Encounter for orthopedic aftercare following surgical amputation: Secondary | ICD-10-CM | POA: Diagnosis not present

## 2017-04-08 DIAGNOSIS — I10 Essential (primary) hypertension: Secondary | ICD-10-CM | POA: Diagnosis not present

## 2017-04-08 DIAGNOSIS — I480 Paroxysmal atrial fibrillation: Secondary | ICD-10-CM | POA: Diagnosis not present

## 2017-04-08 DIAGNOSIS — L89152 Pressure ulcer of sacral region, stage 2: Secondary | ICD-10-CM | POA: Diagnosis not present

## 2017-04-08 DIAGNOSIS — Z4781 Encounter for orthopedic aftercare following surgical amputation: Secondary | ICD-10-CM | POA: Diagnosis not present

## 2017-04-08 DIAGNOSIS — M6281 Muscle weakness (generalized): Secondary | ICD-10-CM | POA: Diagnosis not present

## 2017-04-08 DIAGNOSIS — Z89511 Acquired absence of right leg below knee: Secondary | ICD-10-CM | POA: Diagnosis not present

## 2017-04-09 DIAGNOSIS — M6281 Muscle weakness (generalized): Secondary | ICD-10-CM | POA: Diagnosis not present

## 2017-04-09 DIAGNOSIS — I10 Essential (primary) hypertension: Secondary | ICD-10-CM | POA: Diagnosis not present

## 2017-04-09 DIAGNOSIS — I480 Paroxysmal atrial fibrillation: Secondary | ICD-10-CM | POA: Diagnosis not present

## 2017-04-09 DIAGNOSIS — Z89511 Acquired absence of right leg below knee: Secondary | ICD-10-CM | POA: Diagnosis not present

## 2017-04-09 DIAGNOSIS — Z4781 Encounter for orthopedic aftercare following surgical amputation: Secondary | ICD-10-CM | POA: Diagnosis not present

## 2017-04-09 DIAGNOSIS — L89152 Pressure ulcer of sacral region, stage 2: Secondary | ICD-10-CM | POA: Diagnosis not present

## 2017-04-12 DIAGNOSIS — Z4781 Encounter for orthopedic aftercare following surgical amputation: Secondary | ICD-10-CM | POA: Diagnosis not present

## 2017-04-12 DIAGNOSIS — Z89511 Acquired absence of right leg below knee: Secondary | ICD-10-CM | POA: Diagnosis not present

## 2017-04-12 DIAGNOSIS — M6281 Muscle weakness (generalized): Secondary | ICD-10-CM | POA: Diagnosis not present

## 2017-04-12 DIAGNOSIS — I480 Paroxysmal atrial fibrillation: Secondary | ICD-10-CM | POA: Diagnosis not present

## 2017-04-12 DIAGNOSIS — I10 Essential (primary) hypertension: Secondary | ICD-10-CM | POA: Diagnosis not present

## 2017-04-12 DIAGNOSIS — L89152 Pressure ulcer of sacral region, stage 2: Secondary | ICD-10-CM | POA: Diagnosis not present

## 2017-04-14 DIAGNOSIS — L89152 Pressure ulcer of sacral region, stage 2: Secondary | ICD-10-CM | POA: Diagnosis not present

## 2017-04-14 DIAGNOSIS — Z4781 Encounter for orthopedic aftercare following surgical amputation: Secondary | ICD-10-CM | POA: Diagnosis not present

## 2017-04-14 DIAGNOSIS — I480 Paroxysmal atrial fibrillation: Secondary | ICD-10-CM | POA: Diagnosis not present

## 2017-04-14 DIAGNOSIS — Z89511 Acquired absence of right leg below knee: Secondary | ICD-10-CM | POA: Diagnosis not present

## 2017-04-14 DIAGNOSIS — I10 Essential (primary) hypertension: Secondary | ICD-10-CM | POA: Diagnosis not present

## 2017-04-14 DIAGNOSIS — M6281 Muscle weakness (generalized): Secondary | ICD-10-CM | POA: Diagnosis not present

## 2017-04-16 ENCOUNTER — Telehealth: Payer: Self-pay | Admitting: Surgery

## 2017-04-16 DIAGNOSIS — Z4781 Encounter for orthopedic aftercare following surgical amputation: Secondary | ICD-10-CM | POA: Diagnosis not present

## 2017-04-16 DIAGNOSIS — I10 Essential (primary) hypertension: Secondary | ICD-10-CM | POA: Diagnosis not present

## 2017-04-16 DIAGNOSIS — Z89511 Acquired absence of right leg below knee: Secondary | ICD-10-CM | POA: Diagnosis not present

## 2017-04-16 DIAGNOSIS — I480 Paroxysmal atrial fibrillation: Secondary | ICD-10-CM | POA: Diagnosis not present

## 2017-04-16 DIAGNOSIS — M6281 Muscle weakness (generalized): Secondary | ICD-10-CM | POA: Diagnosis not present

## 2017-04-16 DIAGNOSIS — L89152 Pressure ulcer of sacral region, stage 2: Secondary | ICD-10-CM | POA: Diagnosis not present

## 2017-04-16 NOTE — Telephone Encounter (Signed)
Received fax from [facility] regarding this patient's referral to their office from [place]. She is scheduled to see Dr.Bodea on 06/04/17 at 1:15pm @ [facility] [place] [phone]. I spoke w/ the patient and she is aware of the appt. awt

## 2017-04-20 NOTE — Addendum Note (Signed)
Addendum  created 04/20/17 1103 by Oleta Mouse, MD   Sign clinical note

## 2017-04-20 NOTE — Addendum Note (Signed)
Addendum  created 04/20/17 1151 by Oleta Mouse, MD   Sign clinical note

## 2017-04-20 NOTE — Addendum Note (Signed)
Addendum  created 04/20/17 1021 by Regan Mcbryar, MD   Sign clinical note    

## 2017-04-21 DIAGNOSIS — I480 Paroxysmal atrial fibrillation: Secondary | ICD-10-CM | POA: Diagnosis not present

## 2017-04-21 DIAGNOSIS — I10 Essential (primary) hypertension: Secondary | ICD-10-CM | POA: Diagnosis not present

## 2017-04-21 DIAGNOSIS — L89152 Pressure ulcer of sacral region, stage 2: Secondary | ICD-10-CM | POA: Diagnosis not present

## 2017-04-21 DIAGNOSIS — Z89511 Acquired absence of right leg below knee: Secondary | ICD-10-CM | POA: Diagnosis not present

## 2017-04-21 DIAGNOSIS — M6281 Muscle weakness (generalized): Secondary | ICD-10-CM | POA: Diagnosis not present

## 2017-04-21 DIAGNOSIS — Z4781 Encounter for orthopedic aftercare following surgical amputation: Secondary | ICD-10-CM | POA: Diagnosis not present

## 2017-04-22 DIAGNOSIS — M6281 Muscle weakness (generalized): Secondary | ICD-10-CM | POA: Diagnosis not present

## 2017-04-22 DIAGNOSIS — Z4781 Encounter for orthopedic aftercare following surgical amputation: Secondary | ICD-10-CM | POA: Diagnosis not present

## 2017-04-22 DIAGNOSIS — Z89511 Acquired absence of right leg below knee: Secondary | ICD-10-CM | POA: Diagnosis not present

## 2017-04-22 DIAGNOSIS — L89152 Pressure ulcer of sacral region, stage 2: Secondary | ICD-10-CM | POA: Diagnosis not present

## 2017-04-22 DIAGNOSIS — I480 Paroxysmal atrial fibrillation: Secondary | ICD-10-CM | POA: Diagnosis not present

## 2017-04-22 DIAGNOSIS — I10 Essential (primary) hypertension: Secondary | ICD-10-CM | POA: Diagnosis not present

## 2017-04-23 DIAGNOSIS — I10 Essential (primary) hypertension: Secondary | ICD-10-CM | POA: Diagnosis not present

## 2017-04-23 DIAGNOSIS — M6281 Muscle weakness (generalized): Secondary | ICD-10-CM | POA: Diagnosis not present

## 2017-04-23 DIAGNOSIS — L89152 Pressure ulcer of sacral region, stage 2: Secondary | ICD-10-CM | POA: Diagnosis not present

## 2017-04-23 DIAGNOSIS — Z4781 Encounter for orthopedic aftercare following surgical amputation: Secondary | ICD-10-CM | POA: Diagnosis not present

## 2017-04-23 DIAGNOSIS — I480 Paroxysmal atrial fibrillation: Secondary | ICD-10-CM | POA: Diagnosis not present

## 2017-04-23 DIAGNOSIS — Z89511 Acquired absence of right leg below knee: Secondary | ICD-10-CM | POA: Diagnosis not present

## 2017-04-29 ENCOUNTER — Encounter: Payer: Medicare Other | Attending: Physical Medicine & Rehabilitation | Admitting: Physical Medicine & Rehabilitation

## 2017-04-29 ENCOUNTER — Encounter: Payer: Self-pay | Admitting: Surgery

## 2017-04-29 DIAGNOSIS — T8131XA Disruption of external operation (surgical) wound, not elsewhere classified, initial encounter: Secondary | ICD-10-CM | POA: Insufficient documentation

## 2017-04-29 DIAGNOSIS — I739 Peripheral vascular disease, unspecified: Secondary | ICD-10-CM | POA: Insufficient documentation

## 2017-04-29 DIAGNOSIS — F419 Anxiety disorder, unspecified: Secondary | ICD-10-CM | POA: Insufficient documentation

## 2017-04-29 DIAGNOSIS — G8929 Other chronic pain: Secondary | ICD-10-CM | POA: Insufficient documentation

## 2017-04-29 DIAGNOSIS — I10 Essential (primary) hypertension: Secondary | ICD-10-CM | POA: Insufficient documentation

## 2017-04-29 DIAGNOSIS — Z85828 Personal history of other malignant neoplasm of skin: Secondary | ICD-10-CM | POA: Insufficient documentation

## 2017-04-29 DIAGNOSIS — Z833 Family history of diabetes mellitus: Secondary | ICD-10-CM | POA: Insufficient documentation

## 2017-04-29 DIAGNOSIS — Z8673 Personal history of transient ischemic attack (TIA), and cerebral infarction without residual deficits: Secondary | ICD-10-CM | POA: Insufficient documentation

## 2017-04-29 DIAGNOSIS — Z89512 Acquired absence of left leg below knee: Secondary | ICD-10-CM | POA: Insufficient documentation

## 2017-04-29 DIAGNOSIS — E7800 Pure hypercholesterolemia, unspecified: Secondary | ICD-10-CM | POA: Insufficient documentation

## 2017-04-29 DIAGNOSIS — K449 Diaphragmatic hernia without obstruction or gangrene: Secondary | ICD-10-CM | POA: Insufficient documentation

## 2017-04-29 DIAGNOSIS — Z8249 Family history of ischemic heart disease and other diseases of the circulatory system: Secondary | ICD-10-CM | POA: Insufficient documentation

## 2017-04-29 DIAGNOSIS — Z9889 Other specified postprocedural states: Secondary | ICD-10-CM | POA: Insufficient documentation

## 2017-04-29 DIAGNOSIS — M797 Fibromyalgia: Secondary | ICD-10-CM | POA: Insufficient documentation

## 2017-04-29 DIAGNOSIS — Z87891 Personal history of nicotine dependence: Secondary | ICD-10-CM | POA: Insufficient documentation

## 2017-04-29 DIAGNOSIS — I4891 Unspecified atrial fibrillation: Secondary | ICD-10-CM | POA: Insufficient documentation

## 2017-04-29 DIAGNOSIS — Z7902 Long term (current) use of antithrombotics/antiplatelets: Secondary | ICD-10-CM | POA: Insufficient documentation

## 2017-04-29 DIAGNOSIS — Z89511 Acquired absence of right leg below knee: Secondary | ICD-10-CM | POA: Insufficient documentation

## 2017-04-29 DIAGNOSIS — K219 Gastro-esophageal reflux disease without esophagitis: Secondary | ICD-10-CM | POA: Insufficient documentation

## 2017-04-29 DIAGNOSIS — R531 Weakness: Secondary | ICD-10-CM | POA: Insufficient documentation

## 2017-04-29 DIAGNOSIS — R200 Anesthesia of skin: Secondary | ICD-10-CM | POA: Insufficient documentation

## 2017-05-05 DIAGNOSIS — M6281 Muscle weakness (generalized): Secondary | ICD-10-CM | POA: Diagnosis not present

## 2017-05-05 DIAGNOSIS — I480 Paroxysmal atrial fibrillation: Secondary | ICD-10-CM | POA: Diagnosis not present

## 2017-05-05 DIAGNOSIS — Z4781 Encounter for orthopedic aftercare following surgical amputation: Secondary | ICD-10-CM | POA: Diagnosis not present

## 2017-05-05 DIAGNOSIS — I10 Essential (primary) hypertension: Secondary | ICD-10-CM | POA: Diagnosis not present

## 2017-05-05 DIAGNOSIS — Z89511 Acquired absence of right leg below knee: Secondary | ICD-10-CM | POA: Diagnosis not present

## 2017-05-05 DIAGNOSIS — L89152 Pressure ulcer of sacral region, stage 2: Secondary | ICD-10-CM | POA: Diagnosis not present

## 2017-05-07 DIAGNOSIS — I10 Essential (primary) hypertension: Secondary | ICD-10-CM | POA: Diagnosis not present

## 2017-05-07 DIAGNOSIS — M6281 Muscle weakness (generalized): Secondary | ICD-10-CM | POA: Diagnosis not present

## 2017-05-07 DIAGNOSIS — Z89511 Acquired absence of right leg below knee: Secondary | ICD-10-CM | POA: Diagnosis not present

## 2017-05-07 DIAGNOSIS — L89152 Pressure ulcer of sacral region, stage 2: Secondary | ICD-10-CM | POA: Diagnosis not present

## 2017-05-07 DIAGNOSIS — Z4781 Encounter for orthopedic aftercare following surgical amputation: Secondary | ICD-10-CM | POA: Diagnosis not present

## 2017-05-07 DIAGNOSIS — I480 Paroxysmal atrial fibrillation: Secondary | ICD-10-CM | POA: Diagnosis not present

## 2017-05-11 ENCOUNTER — Encounter: Payer: Self-pay | Admitting: Surgery

## 2017-05-11 ENCOUNTER — Ambulatory Visit (INDEPENDENT_AMBULATORY_CARE_PROVIDER_SITE_OTHER): Payer: Medicare Other | Admitting: Surgery

## 2017-05-11 VITALS — BP 156/81 | HR 84 | Temp 97.5°F | Resp 20 | Ht 62.0 in | Wt 110.0 lb

## 2017-05-11 DIAGNOSIS — I739 Peripheral vascular disease, unspecified: Secondary | ICD-10-CM | POA: Diagnosis not present

## 2017-05-11 DIAGNOSIS — I70229 Atherosclerosis of native arteries of extremities with rest pain, unspecified extremity: Secondary | ICD-10-CM

## 2017-05-11 NOTE — Progress Notes (Signed)
[facility] [place]  Patient name: [name] MRN: [id number] DOB: 08/06/1928 Sex: female  REASON FOR VISIT: follow-up  HPI: [name] is a 83 y.o. female with multiple revascularization attempts bilaterally as listed below:   1. October 2010:She had an above-knee to below-knee popliteal artery bypass with vein in the left leg.  2. 12/26/2014: She had angioplasty and stenting of the left external iliac artery with angioplasty and stenting of the left superficial femoral artery.  3. 10/30/2015: She had a stent in the left superficial femoral artery and also angioplasty of an in stent stenosis in the left popliteal artery.  4. 04/01/2016: She had atherectomy of the left superficial femoral artery and popliteal artery and also drug coated balloon angioplasty of the left superficial femoral artery and popliteal artery.  5. 09/24/2016: The patient had a redo left femoral to above-knee popliteal artery bypass with a 6 mm PTFE graft. She also had left common femoral artery external iliac artery and profunda femoral endarterectomy.  6. 09/26/2016: The redo femoropopliteal bypass graft failed early and she underwent subintimal angioplasty of the left superficial femoral artery and below-knee popliteal artery stenting with multiple viabahn grafts.   7. 09/27/2016: She had right common femoral artery endarterectomy with bovine pericardial patch angioplasty and right lower extremity thromboembolectomy.  8.  12-23-2016:  Right BKA  9.  Left profunda to AT BPG with PTFEon the right side which ultimately resulted in right below knee amputation. At her last office visit six weeks ago, her right below knee amputation had an open area that was debrided. This has now healed.   At her last office visit six weeks ago, she had an open area on the incision that was debrided. She has been getting wound care at home. This is now healed.   Regarding her left leg, she  is still unable to place any weight on her left leg. She has been receiving home health physical therapy at home without much success. She feels like her left leg is going to buckle. She is also unable to tolerate any shoes that cover the dorsum of her foot secondary to her neuropathy. She does feel that the strength in her leg is better.  Her husband is 29 years recently passed away 2 weeks ago. She is very upset regarding this.  Past Medical History:  Diagnosis Date  . Anemia   . Anginal pain ([place])   . Arthritis    "qwhere" (09/05/2016)  . Atrial fibrillation ([place]) 09/2016  . Chronic lower back pain   . Complication of anesthesia    "takes a long time for it to wear off; I can hallucinate if I take too much" (09/05/2016)  . DVT (deep venous thrombosis) ([place]) 10/2009  . Fall from steps 08/31/2013   Fx. pelvis, Left Hip, Left Elbow  . Fibromyalgia   . GERD (gastroesophageal reflux disease)    09/22/16- "no too much anymore"  . GI bleed 10/24/2016  . High cholesterol   . History of blood transfusion   . History of hiatal hernia   . Hypertension   . Macular degeneration, wet ([place])    "started in right eye; now legally blind in that eye; now started in left eye but pretty much in control" (09/05/2016)  . Osteoporosis   . Peripheral vascular disease ([place])    nonviable tissue Right foot  . PONV (postoperative nausea and vomiting)   . Squamous cell carcinoma of skin of right calf Aug.  2015  . Stroke ([place])    TIA's no residual  . TIA (transient ischemic attack)    "several at once; none in a long time" (09/05/2016)    Family History  Problem Relation Age of Onset  . Heart disease Father        Heart Disease before age 58  . Hyperlipidemia Father   . Hypertension Father   . Alcohol abuse Father   . Heart disease Brother   . Hyperlipidemia Brother   . Hypertension Brother   . Deep vein thrombosis Brother   . Heart disease Son        Heart Disease before age 81  .  Hyperlipidemia Son   . Hypertension Son   . Heart attack Son   . Diabetes Son   . Hypertension Son   . Hyperlipidemia Sister   . Hypertension Sister     SOCIAL HISTORY: Social History  Substance Use Topics  . Smoking status: Former Smoker    Types: Cigarettes    Quit date: 11/17/1946  . Smokeless tobacco: Never Used     Comment: "never smoked much"  . Alcohol use No    Allergies  Allergen Reactions  . Motrin [Ibuprofen] Other (See Comments)    ADVERSE REACTION - GI BLEED  . Statins Other (See Comments)    ADVERSE REACTION MUSCLE PAIN & WEAKNESS  . Morphine And Related Other (See Comments)    HALLUCINATIONS REACTION IS SIDE EFFECT  . Oxycontin [Oxycodone Hcl] Other (See Comments)    [REACTION IS SIDE EFFECT]  > HALLUCINATIONS  . Promethazine Hcl Other (See Comments)    IV  Drug only, makes her act crazy  . Sulfa Antibiotics Nausea And Vomiting    Current Outpatient Prescriptions  Medication Sig Dispense Refill  . acetaminophen (TYLENOL) 500 MG tablet Take 1,000 mg by mouth 2 (two) times daily as needed for moderate pain or headache. Patient took this medication for her pain.    . Cholecalciferol (VITAMIN D3) 2000 UNITS TABS Take 2,000 Units by mouth at bedtime.     . clopidogrel (PLAVIX) 75 MG tablet Take 1 tablet (75 mg total) by mouth daily. 30 tablet 1  . collagenase (SANTYL) ointment Apply topically daily. Applied to sacral area daily 15 g 0  . Cyanocobalamin (VITAMIN B-12 PO) Take 1 tablet by mouth daily.    . diazepam (VALIUM) 5 MG tablet Take 0.5 tablets (2.5 mg total) by mouth at bedtime as needed for anxiety. 20 tablet 0  . docusate sodium (COLACE) 100 MG capsule Take 1 capsule (100 mg total) by mouth daily. 10 capsule 0  . fentaNYL (DURAGESIC - DOSED MCG/HR) 12 MCG/HR Place 1 patch (12.5 mcg total) onto the skin every 3 (three) days. 20 patch 0  . Hydrocortisone (GERHARDT'S BUTT CREAM) CREA Apply 1 application topically 2 (two) times daily. 1 each 1  .  loperamide (IMODIUM) 2 MG capsule Take 2 mg by mouth as needed for diarrhea or loose stools.    . methocarbamol (ROBAXIN) 500 MG tablet Take 1 tablet (500 mg total) by mouth every 6 (six) hours as needed for muscle spasms. 60 tablet 0  . metoprolol tartrate (LOPRESSOR) 25 MG tablet Take 1 tablet (25 mg total) by mouth at bedtime. 30 tablet 1  . Multiple Vitamins-Minerals (EYE VITAMINS PO) Take 1 tablet by mouth at bedtime.     Marland Kitchen oxyCODONE (OXY IR/ROXICODONE) 5 MG immediate release tablet Take 1 tablet (5 mg total) by mouth every 4 (four) hours as  needed for moderate pain, severe pain or breakthrough pain. 30 tablet 0  . pantoprazole (PROTONIX) 40 MG tablet Take 1 tablet (40 mg total) by mouth daily. 30 tablet 1  . potassium chloride (K-DUR,KLOR-CON) 10 MEQ tablet Take 1 tablet (10 mEq total) by mouth daily. 30 tablet 0  . valsartan-hydrochlorothiazide (DIOVAN-HCT) 160-12.5 MG tablet Take 0.5 tablets by mouth daily. 30 tablet 0  . gabapentin (NEURONTIN) 600 MG tablet Take 1 tablet (600 mg total) by mouth as directed. 1 tablet twice daily and 1.5 to 2 tablets at night 120 tablet 1   No current facility-administered medications for this visit.     REVIEW OF SYSTEMS:  [X]  denotes positive finding, [ ]  denotes negative finding Cardiac  Comments:  Chest pain or chest pressure:    Shortness of breath upon exertion:    Short of breath when lying flat:    Irregular heart rhythm:        Vascular    Pain in calf, thigh, or hip brought on by ambulation:    Pain in feet at night that wakes you up from your sleep:     Blood clot in your veins:    Leg swelling:         Pulmonary    Oxygen at home:    Productive cough:     Wheezing:         Neurologic    Sudden weakness in arms or legs:     Sudden numbness in arms or legs:     Sudden onset of difficulty speaking or slurred speech:    Temporary loss of vision in one eye:     Problems with dizziness:         Gastrointestinal    Blood in stool:      Vomited blood:         Genitourinary    Burning when urinating:     Blood in urine:        Psychiatric    Major depression:         Hematologic    Bleeding problems:    Problems with blood clotting too easily:        Skin    Rashes or ulcers:        Constitutional    Fever or chills:      PHYSICAL EXAM: Vitals:   05/11/17 1522  BP: (!) 156/81  Pulse: 84  Resp: 20  Temp: 97.5 F (36.4 C)  TempSrc: Oral  SpO2: 97%  Weight: 110 lb (49.9 kg)  Height: 5\' 2"  (1.575 m)    GENERAL: The patient is a well-nourished female, in no acute distress. Becomes tearful when talking about her late husband. The vital signs are documented above. HEENT: normocephalic, atraumatic. No abnormalities noted.  VASCULAR: 2+ left DP pulse. Right BKA open wound has now healed.  PULMONARY: Non labored respiratory effort.  MUSCULOSKELETAL: Right BKA NEUROLOGIC: Neuropathy left leg.  SKIN: There are no ulcers or rashes noted. PSYCHIATRIC: The patient has a normal affect.  MEDICAL ISSUES: Neuropathy left leg S/p right BKA  The patient's right BKA is now healed. Discussed that she place a shrinker back on her right limb. The patient continues to have issues with placing weight on the left leg and also with neuropathy. Home health PT has not been very successful so far. Discussed referral to orthopedics for an opinion regarding alternative braces and possible outpatient therapy. The patient and her daughter will discuss this.   She will  also be going to her pain physician soon. Discussed that treating her neuropathy may help with her physical therapy progress. The patient also recently lost her husband of 93 years recently and has been very upset lately. Discussed contacting her PCP regarding possible anti-depressants during this difficult time.   She will follow up in three months.   [name], PA-C [facility] MD: Trula Slade   I agree with the  above.  Her right below-knee amputation site has almost healed.  She is still having difficulty with numbness in her left foot as well as putting weight on her left leg.  She is scheduled to see the pain clinic in about a month.  I'll have her back to see me in 3 months.  [name]

## 2017-05-12 ENCOUNTER — Other Ambulatory Visit: Payer: Self-pay | Admitting: *Deleted

## 2017-05-12 DIAGNOSIS — Z89511 Acquired absence of right leg below knee: Secondary | ICD-10-CM | POA: Diagnosis not present

## 2017-05-12 DIAGNOSIS — M6281 Muscle weakness (generalized): Secondary | ICD-10-CM | POA: Diagnosis not present

## 2017-05-12 DIAGNOSIS — I10 Essential (primary) hypertension: Secondary | ICD-10-CM | POA: Diagnosis not present

## 2017-05-12 DIAGNOSIS — I480 Paroxysmal atrial fibrillation: Secondary | ICD-10-CM | POA: Diagnosis not present

## 2017-05-12 DIAGNOSIS — I739 Peripheral vascular disease, unspecified: Secondary | ICD-10-CM

## 2017-05-12 DIAGNOSIS — Z4781 Encounter for orthopedic aftercare following surgical amputation: Secondary | ICD-10-CM | POA: Diagnosis not present

## 2017-05-12 DIAGNOSIS — L89152 Pressure ulcer of sacral region, stage 2: Secondary | ICD-10-CM | POA: Diagnosis not present

## 2017-05-14 DIAGNOSIS — Z4781 Encounter for orthopedic aftercare following surgical amputation: Secondary | ICD-10-CM | POA: Diagnosis not present

## 2017-05-14 DIAGNOSIS — I480 Paroxysmal atrial fibrillation: Secondary | ICD-10-CM | POA: Diagnosis not present

## 2017-05-14 DIAGNOSIS — M6281 Muscle weakness (generalized): Secondary | ICD-10-CM | POA: Diagnosis not present

## 2017-05-14 DIAGNOSIS — Z89511 Acquired absence of right leg below knee: Secondary | ICD-10-CM | POA: Diagnosis not present

## 2017-05-14 DIAGNOSIS — L89152 Pressure ulcer of sacral region, stage 2: Secondary | ICD-10-CM | POA: Diagnosis not present

## 2017-05-14 DIAGNOSIS — I10 Essential (primary) hypertension: Secondary | ICD-10-CM | POA: Diagnosis not present

## 2017-05-15 ENCOUNTER — Ambulatory Visit (INDEPENDENT_AMBULATORY_CARE_PROVIDER_SITE_OTHER): Payer: Medicare Other | Admitting: Family

## 2017-05-15 ENCOUNTER — Ambulatory Visit (INDEPENDENT_AMBULATORY_CARE_PROVIDER_SITE_OTHER): Payer: Self-pay | Admitting: Family

## 2017-05-15 ENCOUNTER — Encounter (INDEPENDENT_AMBULATORY_CARE_PROVIDER_SITE_OTHER): Payer: Self-pay | Admitting: Family

## 2017-05-15 VITALS — Ht 62.0 in | Wt 110.0 lb

## 2017-05-15 DIAGNOSIS — I7092 Chronic total occlusion of artery of the extremities: Secondary | ICD-10-CM | POA: Diagnosis not present

## 2017-05-15 DIAGNOSIS — M2142 Flat foot [pes planus] (acquired), left foot: Secondary | ICD-10-CM

## 2017-05-15 DIAGNOSIS — I70422 Atherosclerosis of autologous vein bypass graft(s) of the extremities with rest pain, left leg: Secondary | ICD-10-CM

## 2017-05-15 DIAGNOSIS — Z89511 Acquired absence of right leg below knee: Secondary | ICD-10-CM | POA: Diagnosis not present

## 2017-05-15 DIAGNOSIS — R29898 Other symptoms and signs involving the musculoskeletal system: Secondary | ICD-10-CM

## 2017-05-19 DIAGNOSIS — I10 Essential (primary) hypertension: Secondary | ICD-10-CM | POA: Diagnosis not present

## 2017-05-19 DIAGNOSIS — E876 Hypokalemia: Secondary | ICD-10-CM | POA: Diagnosis not present

## 2017-05-19 DIAGNOSIS — I739 Peripheral vascular disease, unspecified: Secondary | ICD-10-CM | POA: Diagnosis not present

## 2017-05-19 DIAGNOSIS — G629 Polyneuropathy, unspecified: Secondary | ICD-10-CM | POA: Diagnosis not present

## 2017-05-19 DIAGNOSIS — G894 Chronic pain syndrome: Secondary | ICD-10-CM | POA: Diagnosis not present

## 2017-05-19 DIAGNOSIS — F064 Anxiety disorder due to known physiological condition: Secondary | ICD-10-CM | POA: Diagnosis not present

## 2017-05-19 DIAGNOSIS — S99922A Unspecified injury of left foot, initial encounter: Secondary | ICD-10-CM | POA: Diagnosis not present

## 2017-05-21 ENCOUNTER — Ambulatory Visit (INDEPENDENT_AMBULATORY_CARE_PROVIDER_SITE_OTHER): Payer: Medicare Other | Admitting: Orthopedic Surgery

## 2017-05-21 DIAGNOSIS — I87323 Chronic venous hypertension (idiopathic) with inflammation of bilateral lower extremity: Secondary | ICD-10-CM

## 2017-05-21 DIAGNOSIS — M21372 Foot drop, left foot: Secondary | ICD-10-CM

## 2017-05-21 DIAGNOSIS — Z89511 Acquired absence of right leg below knee: Secondary | ICD-10-CM

## 2017-05-21 DIAGNOSIS — I7092 Chronic total occlusion of artery of the extremities: Secondary | ICD-10-CM

## 2017-05-21 NOTE — Progress Notes (Signed)
Office Visit Note   Patient: [name]           Date of Birth: 1928-07-17           MRN: [id number] Visit Date: 05/21/2017              Requested by: Josetta Huddle, MD [address] [place], [place] [address] PCP: Josetta Huddle, MD  Chief Complaint  Patient presents with  . Left Leg - Weakness      HPI: Patient is a 83 year old woman who is status post right transtibial amputation as well as status post endovascular reconstruction for the left lower extremity who states that she's been having increasing numbness in the left foot since her revascularization surgery to the left lower extremity. She states she's unable to weight-bear and that her foot turns in with attempted weightbearing. Patient thinks she has nerve damage from her surgery.  Assessment & Plan: Visit Diagnoses:  1. S/P unilateral BKA (below knee amputation), right ([place])   2. Foot drop, left   3. Idiopathic chronic venous hypertension of both lower extremities with inflammation   Patient does not have focal nerve injury to the left lower extremity her numbness seems to be more due to the venous stasis swelling.  Plan: Prescription was written for her to use her stump shrinker on the right lower extremity. Recommended knee-high 15-20 mm compression stockings to resolve the venous stasis swelling in the left leg. A prescription was also written for extra-depth shoe with double upright brace as for the left. Patient is currently working with Museum/gallery curator for her prosthesis in the right and a prescription was written for [employer] to fabricate the bracing for this shoe for the left.  Follow-Up Instructions: Return in about 2 months (around 07/22/2017).   Ortho Exam  Patient is alert, oriented, no adenopathy, well-dressed, normal affect, normal respiratory effort. On examination patient is ambulating in a wheelchair. She has venous stasis swelling in the right transtibial amputation she has small ulceration  there is no signs of infection. Examination the left foot she has good anterior tibial function and posterior tibial function as well as Achilles function. The peroneus brevis has some swelling and does not have active motor function. This appears to be mostly due to peroneus brevis tendinopathy. Patient has gross sensation in the left foot with no areas of sensory deficit. She has a strong dorsalis pedis pulse she has dorsiflexion to neutral with her knee extended. Patient has good motor function in the left foot except for the peroneus brevis eversion.  Imaging: No results found.  Labs: Lab Results  Component Value Date   HGBA1C  02/11/2009    5.6 (NOTE)   The ADA recommends the following therapeutic goal for glycemic   control related to Hgb A1C measurement:   Goal of Therapy:   < 7.0% Hgb A1C   Reference: American Diabetes Association: Clinical Practice   Recommendations 2008, Diabetes Care,  2008, 31:(Suppl 1).   REPTSTATUS 12/24/2016 FINAL 12/19/2016   GRAMSTAIN  10/25/2009    FEW WBC PRESENT, PREDOMINANTLY PMN NO SQUAMOUS EPITHELIAL CELLS SEEN FEW GRAM POSITIVE COCCI IN PAIRS   CULT NO GROWTH 5 DAYS 12/19/2016   LABORGA PSEUDOMONAS AERUGINOSA (A) 12/16/2016    Orders:  No orders of the defined types were placed in this encounter.  No orders of the defined types were placed in this encounter.    Procedures: No procedures performed  Clinical Data: No additional findings.  ROS:  All other systems negative, except as noted in the HPI. Review of Systems  Objective: Vital Signs: There were no vitals taken for this visit.  Specialty Comments:  No specialty comments available.  PMFS History: Patient Active Problem List   Diagnosis Date Noted  . Idiopathic chronic venous hypertension of both lower extremities with inflammation 05/21/2017  . Foot drop, left 05/21/2017  . Decubitus ulcer of sacral region, unstageable ([place])   . Deep tissue injury   . Hypokalemia   .  Hypoalbuminemia due to protein-calorie malnutrition ([place])   . Debilitated 01/21/2017  . Neuropathic pain   . Post-operative pain   . Slow transit constipation   . Debility   . Ischemic leg   . PAF (paroxysmal atrial fibrillation) ([place])   . Anemia of chronic disease   . Chronic pain syndrome   . Fibromyalgia   . Stage 3 chronic kidney disease   . Benign essential HTN   . Abnormal urinalysis   . PVD (peripheral vascular disease) ([place]) 01/13/2017  . Post-op pain 01/03/2017  . Phantom limb pain ([place]) 01/03/2017  . S/P unilateral BKA (below knee amputation), right ([place]) 12/30/2016  . Acute on chronic combined systolic and diastolic CHF (congestive heart failure) ([place]) 12/22/2016  . Anemia 12/22/2016  . Palliative care encounter   . Lower extremity pain, right   . Acute GI bleeding 10/23/2016  . Paroxysmal atrial fibrillation ([place]) 10/03/2016  . Atherosclerosis of autologous vein bypass graft of left lower extremity with rest pain ([place]) 09/24/2016  . PAD (peripheral artery disease) ([place]) 09/05/2016  . Groin pain 04/02/2015  . Aftercare following surgery of the circulatory system, [employer] 12/13/2013  . Shingles 10/26/2013  . Anxiety 10/26/2013  . Acute blood loss anemia 09/05/2013  . Elbow fracture, left 09/05/2013  . Left acetabular fracture ([place]) 09/05/2013  . Ankle fracture, left 09/05/2013  . HTN (hypertension) 09/03/2013  . HLD (hyperlipidemia) 09/03/2013  . Peripheral vascular disease, unspecified ([place]) 05/10/2012  . Chronic total occlusion of artery of the extremities ([place]) 01/19/2012   Past Medical History:  Diagnosis Date  . Anemia   . Anginal pain ([place])   . Arthritis    "qwhere" (09/05/2016)  . Atrial fibrillation ([place]) 09/2016  . Chronic lower back pain   . Complication of anesthesia    "takes a long time for it to wear off; I can hallucinate if I take too much" (09/05/2016)  . DVT (deep venous thrombosis) ([place]) 10/2009  . Fall from steps 08/31/2013   Fx. pelvis,  Left Hip, Left Elbow  . Fibromyalgia   . GERD (gastroesophageal reflux disease)    09/22/16- "no too much anymore"  . GI bleed 10/24/2016  . High cholesterol   . History of blood transfusion   . History of hiatal hernia   . Hypertension   . Macular degeneration, wet ([place])    "started in right eye; now legally blind in that eye; now started in left eye but pretty much in control" (09/05/2016)  . Osteoporosis   . Peripheral vascular disease ([place])    nonviable tissue Right foot  . PONV (postoperative nausea and vomiting)   . Squamous cell carcinoma of skin of right calf Aug. 2015  . Stroke ([place])    TIA's no residual  . TIA (transient ischemic attack)    "several at once; none in a long time" (09/05/2016)    Family History  Problem Relation Age of Onset  . Heart disease Father  Heart Disease before age 78  . Hyperlipidemia Father   . Hypertension Father   . Alcohol abuse Father   . Heart disease Brother   . Hyperlipidemia Brother   . Hypertension Brother   . Deep vein thrombosis Brother   . Heart disease Son        Heart Disease before age 19  . Hyperlipidemia Son   . Hypertension Son   . Heart attack Son   . Diabetes Son   . Hypertension Son   . Hyperlipidemia Sister   . Hypertension Sister     Past Surgical History:  Procedure Laterality Date  . ABDOMINAL AORTAGRAM N/A 12/26/2014   Procedure: ABDOMINAL Maxcine Ham;  Surgeon: Serafina Mitchell, MD;  Location: [facility] CATH LAB;  Service: Cardiovascular;  Laterality: N/A;  . AMPUTATION Right 12/23/2016   Procedure: AMPUTATION BELOW KNEE;  Surgeon: Serafina Mitchell, MD;  Location: [facility];  Service: Vascular;  Laterality: Right;  . AORTOGRAM N/A 09/26/2016   Procedure: AORTOGRAM;  Surgeon: Waynetta Sandy, MD;  Location: [facility];  Service: Vascular;  Laterality: N/A;  . CARPAL TUNNEL RELEASE Right   . CATARACT EXTRACTION W/ INTRAOCULAR LENS  IMPLANT, BILATERAL Bilateral   . COLONOSCOPY    . DILATION AND CURETTAGE OF UTERUS     . DRESSING CHANGE UNDER ANESTHESIA Right 01/16/2017   Procedure: DRESSING CHANGE RIGHT BELOW KNEE AMPUTATION;  Surgeon: Serafina Mitchell, MD;  Location: [facility];  Service: Vascular;  Laterality: Right;  . EYE SURGERY Right    "macular OR"  . FEMORAL ARTERY STENT  12-11-10   Left SFA  . FEMORAL-POPLITEAL BYPASS GRAFT Left 09/24/2016   Procedure: REDO FEMORAL TO POPLITEAL ARTERY BYPASS GRAFT USING 6MM PROPATEN RINGED GORTEX GRAFT;  Surgeon: Serafina Mitchell, MD;  Location: [facility];  Service: Vascular;  Laterality: Left;  . FEMORAL-TIBIAL BYPASS GRAFT Left 01/16/2017   Procedure: REDO BYPASS GRAFT FEMORAL-TIBIAL ARTERY WITH GORTEX GRAFT;  Surgeon: Serafina Mitchell, MD;  Location: [facility];  Service: Vascular;  Laterality: Left;  AND LOWER LEG   . I&D EXTREMITY Right 12/17/2016   Procedure: IRRIGATION AND DEBRIDEMENT RIGHT FOOT;  Surgeon: Serafina Mitchell, MD;  Location: [facility];  Service: Vascular;  Laterality: Right;  . INCISION AND DRAINAGE OF WOUND Left 10/25/2009   leg/notes 11/13/2009  . INSERTION OF ILIAC STENT Left 12/26/2014   Procedure: INSERTION OF ILIAC STENT;  Surgeon: Serafina Mitchell, MD;  Location: [facility] CATH LAB;  Service: Cardiovascular;  Laterality: Left;  . INSERTION OF ILIAC STENT Left 09/26/2016   Procedure: SUB INTIMAL INSERTION OF SUPERFICIAL FEMORAL ARTERY AND BELOW KNEE BYPASS GRAFT;  Surgeon: Waynetta Sandy, MD;  Location: [facility];  Service: Vascular;  Laterality: Left;  . JOINT REPLACEMENT     knee  . JOINT REPLACEMENT Left Oct. 17, 2014   Elbow ( pt fell 08-31-13 )  . ORIF SHOULDER FRACTURE Right    "it was crushed"  . PERIPHERAL VASCULAR CATHETERIZATION N/A 10/30/2015   Procedure: Abdominal Aortogram w/Lower Extremity;  Surgeon: Serafina Mitchell, MD;  Location: [facility] CV LAB;  Service: Cardiovascular;  Laterality: N/A;  . PERIPHERAL VASCULAR CATHETERIZATION  10/30/2015   Procedure: Peripheral Vascular Intervention;  Surgeon: Serafina Mitchell, MD;  Location: [place] [facility] CV  LAB;  Service: Cardiovascular;;  . PERIPHERAL VASCULAR CATHETERIZATION N/A 04/01/2016   Procedure: Abdominal Aortogram w/Lower Extremity;  Surgeon: Serafina Mitchell, MD;  Location: [facility] CV LAB;  Service: Cardiovascular;  Laterality: N/A;  . PERIPHERAL  VASCULAR CATHETERIZATION Left 04/01/2016   Procedure: Peripheral Vascular Atherectomy;  Surgeon: Serafina Mitchell, MD;  Location: [facility] CV LAB;  Service: Cardiovascular;  Laterality: Left;  Superficial femoral artery.  Marland Kitchen PERIPHERAL VASCULAR CATHETERIZATION Right 09/05/2016   "stent"  . PERIPHERAL VASCULAR CATHETERIZATION N/A 09/05/2016   Procedure: Abdominal Aortogram w/Lower Extremity;  Surgeon: Serafina Mitchell, MD;  Location: [facility] CV LAB;  Service: Cardiovascular;  Laterality: N/A;  . PERIPHERAL VASCULAR CATHETERIZATION Right 09/05/2016   Procedure: Peripheral Vascular Intervention;  Surgeon: Serafina Mitchell, MD;  Location: [facility] CV LAB;  Service: Cardiovascular;  Laterality: Right;  Superficial Femoral  . PERIPHERAL VASCULAR CATHETERIZATION Left 09/09/2016   Procedure: Lower Extremity Angiography;  Surgeon: Serafina Mitchell, MD;  Location: [facility] CV LAB;  Service: Cardiovascular;  Laterality: Left;  . PR VEIN BYPASS GRAFT,AORTO-FEM-POP  09-13-09   Left Fem-pop  . THROMBECTOMY FEMORAL ARTERY Right 09/26/2016   Procedure: Thromboembolectomy Right Lower Extremity, Right Femoral Artery Endarterectomy with Patch Angioplasty; Right Lower Extremity Angiogram ;  Surgeon: Waynetta Sandy, MD;  Location: [place];  Service: Vascular;  Laterality: Right;  . TOTAL ELBOW ARTHROPLASTY Left 09/03/2013   Procedure: LEFT TOTAL ELBOW ARTHROPLASTY;  Surgeon: Roseanne Kaufman, MD;  Location: [facility];  Service: Orthopedics;  Laterality: Left;  . TOTAL KNEE ARTHROPLASTY Left 06/2006  . TRANSMETATARSAL AMPUTATION Right 12/11/2016   Procedure: TRANSMETATARSAL AMPUTATION;  Surgeon: Serafina Mitchell, MD;  Location: [facility];  Service: Vascular;   Laterality: Right;  . TUBAL LIGATION    . VAGINAL HYSTERECTOMY     Social History   Occupational History  . Not on file.   Social History Main Topics  . Smoking status: Former Smoker    Types: Cigarettes    Quit date: 11/17/1946  . Smokeless tobacco: Never Used     Comment: "never smoked much"  . Alcohol use No  . Drug use: No  . Sexual activity: Not on file

## 2017-05-21 NOTE — Progress Notes (Signed)
Office Visit Note   Patient: [name]           Date of Birth: Apr 13, 1928           MRN: [id number] Visit Date: 05/15/2017              Requested by: Josetta Huddle, MD [address] [place], [place] [address] PCP: Josetta Huddle, MD  Chief Complaint  Patient presents with  . Left Foot - Numbness      HPI: The patient is an 83 year old woman seen in follow up for left foot pain and weakness. She is a right below the knee amputee. Did have recent revascularization procedure on the left. Prior to procedure was able to pivot on left foot. Since revascularization having constant pain in LLE. This is associated with numbness and tingling. States the foot feels cold. Is unable to bear weight on LLE.  Vascular surgery is concerned for nerve damage. Have referred to Dr. Sharol Given.   Does take Neurontin.   Assessment & Plan: Visit Diagnoses:  1. Status post below knee amputation of right lower extremity ([place])   2. Chronic total occlusion of artery of the extremities (HCC)   3. 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 Weakness of left foot     Plan: Discussed CRPS. Offered to start Elavil. Patient declined, states her late husband had hallucinations when he took Elavil. Will defer medication management until next visit. Will have her follow with Dr. Sharol Given next week for eval of weakness.   Follow-Up Instructions: Return in about 4 days (around 05/19/2017).   Left Ankle Exam  Swelling: none  Tenderness  The patient is experiencing no tenderness.   Comments:  About 5 degrees of ROM, both plantarflexion and dorsiflexion. 1/5 strength with eversion and inversion.   PTT and peroneal tendons tender along course. Achilles is nontender. The plantar fascia is tender to muscle belly > origin, states feels like pins and needles.  Foot is normothermic      Patient is alert, oriented, no adenopathy, well-dressed, normal  affect, normal respiratory effort.   Imaging: No results found.  Labs: Lab Results  Component Value Date   HGBA1C  02/11/2009    5.6 (NOTE)   The ADA recommends the following therapeutic goal for glycemic   control related to Hgb A1C measurement:   Goal of Therapy:   < 7.0% Hgb A1C   Reference: American Diabetes Association: Clinical Practice   Recommendations 2008, Diabetes Care,  2008, 31:(Suppl 1).   REPTSTATUS 12/24/2016 FINAL 12/19/2016   GRAMSTAIN  10/25/2009    FEW WBC PRESENT, PREDOMINANTLY PMN NO SQUAMOUS EPITHELIAL CELLS SEEN FEW GRAM POSITIVE COCCI IN PAIRS   CULT NO GROWTH 5 DAYS 12/19/2016   LABORGA PSEUDOMONAS AERUGINOSA (A) 12/16/2016    Orders:  No orders of the defined types were placed in this encounter.  No orders of the defined types were placed in this encounter.    Procedures: No procedures performed  Clinical Data: No additional findings.  ROS:  All other systems negative, except as noted in the HPI. Review of Systems  Objective: Vital Signs: Ht 5\' 2"  (1.575 m)   Wt 110 lb (49.9 kg)   BMI 20.12 kg/m   Specialty Comments:  No specialty comments available.  PMFS History: Patient Active Problem List   Diagnosis Date Noted  . Decubitus ulcer of sacral region, unstageable ([place])   .  Deep tissue injury   . Hypokalemia   . Hypoalbuminemia due to protein-calorie malnutrition ([place])   . Debilitated 01/21/2017  . Neuropathic pain   . Post-operative pain   . Slow transit constipation   . Debility   . Ischemic leg   . PAF (paroxysmal atrial fibrillation) ([place])   . Anemia of chronic disease   . Chronic pain syndrome   . Fibromyalgia   . Stage 3 chronic kidney disease   . Benign essential HTN   . Abnormal urinalysis   . PVD (peripheral vascular disease) ([place]) 01/13/2017  . Post-op pain 01/03/2017  . Phantom limb pain ([place]) 01/03/2017  . Status post below knee amputation of right lower extremity ([place]) 12/30/2016  . Acute on chronic  combined systolic and diastolic CHF (congestive heart failure) ([place]) 12/22/2016  . Anemia 12/22/2016  . Palliative care encounter   . Lower extremity pain, right   . Acute GI bleeding 10/23/2016  . Paroxysmal atrial fibrillation ([place]) 10/03/2016  . 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 PAD (peripheral artery disease) ([place]) 09/05/2016  . Groin pain 04/02/2015  . Aftercare following surgery of the circulatory system, [place] 12/13/2013  . Shingles 10/26/2013  . Anxiety 10/26/2013  . Acute blood loss anemia 09/05/2013  . Elbow fracture, left 09/05/2013  . Left acetabular fracture ([place]) 09/05/2013  . Ankle fracture, left 09/05/2013  . HTN (hypertension) 09/03/2013  . HLD (hyperlipidemia) 09/03/2013  . Peripheral vascular disease, unspecified ([place]) 05/10/2012  . Chronic total occlusion of artery of the extremities ([place]) 01/19/2012   Past Medical History:  Diagnosis Date  . Anemia   . Anginal pain ([place])   . Arthritis    "qwhere" (09/05/2016)  . Atrial fibrillation ([place]) 09/2016  . Chronic lower back pain   . Complication of anesthesia    "takes a long time for it to wear off; I can hallucinate if I take too much" (09/05/2016)  . DVT (deep venous thrombosis) ([place]) 10/2009  . Fall from steps 08/31/2013   Fx. pelvis, Left Hip, Left Elbow  . Fibromyalgia   . GERD (gastroesophageal reflux disease)    09/22/16- "no too much anymore"  . GI bleed 10/24/2016  . High cholesterol   . History of blood transfusion   . History of hiatal hernia   . Hypertension   . Macular degeneration, wet ([place])    "started in right eye; now legally blind in that eye; now started in left eye but pretty much in control" (09/05/2016)  . Osteoporosis   . Peripheral vascular disease ([place])    nonviable tissue Right foot  . PONV (postoperative nausea and vomiting)   . Squamous cell carcinoma of skin of right calf Aug. 2015  . Stroke ([place])     TIA's no residual  . TIA (transient ischemic attack)    "several at once; none in a long time" (09/05/2016)    Family History  Problem Relation Age of Onset  . Heart disease Father        Heart Disease before age 50  . Hyperlipidemia Father   . Hypertension Father   . Alcohol abuse Father   . Heart disease Brother   . Hyperlipidemia Brother   . Hypertension Brother   . Deep vein thrombosis Brother   . Heart disease Son        Heart Disease before age 35  . Hyperlipidemia Son   . Hypertension Son   . Heart  attack Son   . Diabetes Son   . Hypertension Son   . Hyperlipidemia Sister   . Hypertension Sister     Past Surgical History:  Procedure Laterality Date  . ABDOMINAL AORTAGRAM N/A 12/26/2014   Procedure: ABDOMINAL Maxcine Ham;  Surgeon: Serafina Mitchell, MD;  Location: [facility] CATH LAB;  Service: Cardiovascular;  Laterality: N/A;  . AMPUTATION Right 12/23/2016   Procedure: AMPUTATION BELOW KNEE;  Surgeon: Serafina Mitchell, MD;  Location: [place];  Service: Vascular;  Laterality: Right;  . AORTOGRAM N/A 09/26/2016   Procedure: AORTOGRAM;  Surgeon: Waynetta Sandy, MD;  Location: [place];  Service: Vascular;  Laterality: N/A;  . CARPAL TUNNEL RELEASE Right   . CATARACT EXTRACTION W/ INTRAOCULAR LENS  IMPLANT, BILATERAL Bilateral   . COLONOSCOPY    . DILATION AND CURETTAGE OF UTERUS    . DRESSING CHANGE UNDER ANESTHESIA Right 01/16/2017   Procedure: DRESSING CHANGE RIGHT BELOW KNEE AMPUTATION;  Surgeon: Serafina Mitchell, MD;  Location: [place];  Service: Vascular;  Laterality: Right;  . EYE SURGERY Right    "macular OR"  . FEMORAL ARTERY STENT  12-11-10   Left SFA  . FEMORAL-POPLITEAL BYPASS GRAFT Left 09/24/2016   Procedure: REDO FEMORAL TO POPLITEAL ARTERY BYPASS GRAFT USING 6MM PROPATEN RINGED GORTEX GRAFT;  Surgeon: Serafina Mitchell, MD;  Location: [place];  Service: Vascular;  Laterality: Left;  . FEMORAL-TIBIAL BYPASS GRAFT Left 01/16/2017   Procedure: REDO BYPASS GRAFT FEMORAL-TIBIAL  ARTERY WITH GORTEX GRAFT;  Surgeon: Serafina Mitchell, MD;  Location: [place];  Service: Vascular;  Laterality: Left;  AND LOWER LEG   . I&D EXTREMITY Right 12/17/2016   Procedure: IRRIGATION AND DEBRIDEMENT RIGHT FOOT;  Surgeon: Serafina Mitchell, MD;  Location: [place];  Service: Vascular;  Laterality: Right;  . INCISION AND DRAINAGE OF WOUND Left 10/25/2009   leg/notes 11/13/2009  . INSERTION OF ILIAC STENT Left 12/26/2014   Procedure: INSERTION OF ILIAC STENT;  Surgeon: Serafina Mitchell, MD;  Location: [facility] CATH LAB;  Service: Cardiovascular;  Laterality: Left;  . INSERTION OF ILIAC STENT Left 09/26/2016   Procedure: SUB INTIMAL INSERTION OF SUPERFICIAL FEMORAL ARTERY AND BELOW KNEE BYPASS GRAFT;  Surgeon: Waynetta Sandy, MD;  Location: [place];  Service: Vascular;  Laterality: Left;  . JOINT REPLACEMENT     knee  . JOINT REPLACEMENT Left Oct. 17, 2014   Elbow ( pt fell 08-31-13 )  . ORIF SHOULDER FRACTURE Right    "it was crushed"  . PERIPHERAL VASCULAR CATHETERIZATION N/A 10/30/2015   Procedure: Abdominal Aortogram w/Lower Extremity;  Surgeon: Serafina Mitchell, MD;  Location: [place] CV LAB;  Service: Cardiovascular;  Laterality: N/A;  . PERIPHERAL VASCULAR CATHETERIZATION  10/30/2015   Procedure: Peripheral Vascular Intervention;  Surgeon: Serafina Mitchell, MD;  Location: [place] CV LAB;  Service: Cardiovascular;;  . PERIPHERAL VASCULAR CATHETERIZATION N/A 04/01/2016   Procedure: Abdominal Aortogram w/Lower Extremity;  Surgeon: Serafina Mitchell, MD;  Location: [place] CV LAB;  Service: Cardiovascular;  Laterality: N/A;  . PERIPHERAL VASCULAR CATHETERIZATION Left 04/01/2016   Procedure: Peripheral Vascular Atherectomy;  Surgeon: Serafina Mitchell, MD;  Location: [place] CV LAB;  Service: Cardiovascular;  Laterality: Left;  Superficial femoral artery.  Marland Kitchen PERIPHERAL VASCULAR CATHETERIZATION Right 09/05/2016   "stent"  . PERIPHERAL VASCULAR CATHETERIZATION N/A 09/05/2016   Procedure:  Abdominal Aortogram w/Lower Extremity;  Surgeon: Serafina Mitchell, MD;  Location: [place] CV LAB;  Service: Cardiovascular;  Laterality: N/A;  .  PERIPHERAL VASCULAR CATHETERIZATION Right 09/05/2016   Procedure: Peripheral Vascular Intervention;  Surgeon: Serafina Mitchell, MD;  Location: [place] CV LAB;  Service: Cardiovascular;  Laterality: Right;  Superficial Femoral  . PERIPHERAL VASCULAR CATHETERIZATION Left 09/09/2016   Procedure: Lower Extremity Angiography;  Surgeon: Serafina Mitchell, MD;  Location: [place] CV LAB;  Service: Cardiovascular;  Laterality: Left;  . PR VEIN BYPASS GRAFT,AORTO-FEM-POP  09-13-09   Left Fem-pop  . THROMBECTOMY FEMORAL ARTERY Right 09/26/2016   Procedure: Thromboembolectomy Right Lower Extremity, Right Femoral Artery Endarterectomy with Patch Angioplasty; Right Lower Extremity Angiogram ;  Surgeon: Waynetta Sandy, MD;  Location: [place];  Service: Vascular;  Laterality: Right;  . TOTAL ELBOW ARTHROPLASTY Left 09/03/2013   Procedure: LEFT TOTAL ELBOW ARTHROPLASTY;  Surgeon: Roseanne Kaufman, MD;  Location: [place];  Service: Orthopedics;  Laterality: Left;  . TOTAL KNEE ARTHROPLASTY Left 06/2006  . TRANSMETATARSAL AMPUTATION Right 12/11/2016   Procedure: TRANSMETATARSAL AMPUTATION;  Surgeon: Serafina Mitchell, MD;  Location: [facility];  Service: Vascular;  Laterality: Right;  . TUBAL LIGATION    . VAGINAL HYSTERECTOMY     Social History   Occupational History  . Not on file.   Social History Main Topics  . Smoking status: Former Smoker    Types: Cigarettes    Quit date: 11/17/1946  . Smokeless tobacco: Never Used     Comment: "never smoked much"  . Alcohol use No  . Drug use: No  . Sexual activity: Not on file

## 2017-05-22 DIAGNOSIS — Z89511 Acquired absence of right leg below knee: Secondary | ICD-10-CM | POA: Diagnosis not present

## 2017-05-22 DIAGNOSIS — M6281 Muscle weakness (generalized): Secondary | ICD-10-CM | POA: Diagnosis not present

## 2017-05-22 DIAGNOSIS — Z4781 Encounter for orthopedic aftercare following surgical amputation: Secondary | ICD-10-CM | POA: Diagnosis not present

## 2017-05-22 DIAGNOSIS — I480 Paroxysmal atrial fibrillation: Secondary | ICD-10-CM | POA: Diagnosis not present

## 2017-05-22 DIAGNOSIS — L89152 Pressure ulcer of sacral region, stage 2: Secondary | ICD-10-CM | POA: Diagnosis not present

## 2017-05-22 DIAGNOSIS — I10 Essential (primary) hypertension: Secondary | ICD-10-CM | POA: Diagnosis not present

## 2017-06-04 DIAGNOSIS — Z89511 Acquired absence of right leg below knee: Secondary | ICD-10-CM | POA: Diagnosis not present

## 2017-06-04 DIAGNOSIS — G894 Chronic pain syndrome: Secondary | ICD-10-CM | POA: Diagnosis not present

## 2017-06-04 DIAGNOSIS — F4321 Adjustment disorder with depressed mood: Secondary | ICD-10-CM | POA: Diagnosis not present

## 2017-06-04 DIAGNOSIS — I739 Peripheral vascular disease, unspecified: Secondary | ICD-10-CM | POA: Diagnosis not present

## 2017-06-10 ENCOUNTER — Encounter: Payer: Medicare Other | Attending: Physical Medicine & Rehabilitation | Admitting: Physical Medicine & Rehabilitation

## 2017-06-10 ENCOUNTER — Encounter: Payer: Self-pay | Admitting: Physical Medicine & Rehabilitation

## 2017-06-10 VITALS — BP 159/78 | HR 81

## 2017-06-10 DIAGNOSIS — Z9889 Other specified postprocedural states: Secondary | ICD-10-CM | POA: Insufficient documentation

## 2017-06-10 DIAGNOSIS — K449 Diaphragmatic hernia without obstruction or gangrene: Secondary | ICD-10-CM | POA: Insufficient documentation

## 2017-06-10 DIAGNOSIS — G546 Phantom limb syndrome with pain: Secondary | ICD-10-CM

## 2017-06-10 DIAGNOSIS — Z7902 Long term (current) use of antithrombotics/antiplatelets: Secondary | ICD-10-CM | POA: Insufficient documentation

## 2017-06-10 DIAGNOSIS — E7800 Pure hypercholesterolemia, unspecified: Secondary | ICD-10-CM | POA: Insufficient documentation

## 2017-06-10 DIAGNOSIS — Z85828 Personal history of other malignant neoplasm of skin: Secondary | ICD-10-CM | POA: Diagnosis not present

## 2017-06-10 DIAGNOSIS — T8131XA Disruption of external operation (surgical) wound, not elsewhere classified, initial encounter: Secondary | ICD-10-CM | POA: Diagnosis not present

## 2017-06-10 DIAGNOSIS — I7092 Chronic total occlusion of artery of the extremities: Secondary | ICD-10-CM

## 2017-06-10 DIAGNOSIS — K219 Gastro-esophageal reflux disease without esophagitis: Secondary | ICD-10-CM | POA: Insufficient documentation

## 2017-06-10 DIAGNOSIS — I739 Peripheral vascular disease, unspecified: Secondary | ICD-10-CM | POA: Diagnosis not present

## 2017-06-10 DIAGNOSIS — S32492S Other specified fracture of left acetabulum, sequela: Secondary | ICD-10-CM | POA: Diagnosis not present

## 2017-06-10 DIAGNOSIS — M797 Fibromyalgia: Secondary | ICD-10-CM | POA: Insufficient documentation

## 2017-06-10 DIAGNOSIS — Z8249 Family history of ischemic heart disease and other diseases of the circulatory system: Secondary | ICD-10-CM | POA: Diagnosis not present

## 2017-06-10 DIAGNOSIS — I4891 Unspecified atrial fibrillation: Secondary | ICD-10-CM | POA: Diagnosis not present

## 2017-06-10 DIAGNOSIS — I10 Essential (primary) hypertension: Secondary | ICD-10-CM | POA: Insufficient documentation

## 2017-06-10 DIAGNOSIS — Z89512 Acquired absence of left leg below knee: Secondary | ICD-10-CM | POA: Diagnosis not present

## 2017-06-10 DIAGNOSIS — Z833 Family history of diabetes mellitus: Secondary | ICD-10-CM | POA: Diagnosis not present

## 2017-06-10 DIAGNOSIS — R200 Anesthesia of skin: Secondary | ICD-10-CM | POA: Insufficient documentation

## 2017-06-10 DIAGNOSIS — F419 Anxiety disorder, unspecified: Secondary | ICD-10-CM | POA: Diagnosis not present

## 2017-06-10 DIAGNOSIS — M722 Plantar fascial fibromatosis: Secondary | ICD-10-CM | POA: Insufficient documentation

## 2017-06-10 DIAGNOSIS — G8929 Other chronic pain: Secondary | ICD-10-CM | POA: Diagnosis not present

## 2017-06-10 DIAGNOSIS — Z8673 Personal history of transient ischemic attack (TIA), and cerebral infarction without residual deficits: Secondary | ICD-10-CM | POA: Insufficient documentation

## 2017-06-10 DIAGNOSIS — R531 Weakness: Secondary | ICD-10-CM | POA: Diagnosis not present

## 2017-06-10 DIAGNOSIS — Z89511 Acquired absence of right leg below knee: Secondary | ICD-10-CM | POA: Insufficient documentation

## 2017-06-10 DIAGNOSIS — Z87891 Personal history of nicotine dependence: Secondary | ICD-10-CM | POA: Diagnosis not present

## 2017-06-10 NOTE — Progress Notes (Signed)
Subjective:    Patient ID: [name], female    DOB: [birth year], 83 y.o.   MRN: [id number]  HPI   Mrs. Wolin is here in follow up of her right BKA and associated pain and functional deficits. Since I last saw her, she has lost her husband and remains in morning. Additionally her vascular surgeon referred her to Dr. Sharol Given for assessment of her left fot pain as well as [facility] for treatment of her pain where she was started on cymbalta and had her gabapentin increased (a medication we had already placed her on and subsequently decreased due to sedation). She is having ongoing pain in the left foot. She has a hard time bearing weight. Dr. Sharol Given recommended orthotic shoes and compression stockings to control edema. Additionally, she was given a stump shrinker for the right leg. The leg is slowly healing but hasn't completely closed. She is using minimal tylenol at this point for pain  Pain Inventory Average Pain 5 Pain Right Now 4 My pain is intermittent, sharp, stabbing and tingling  In the last 24 hours, has pain interfered with the following? General activity 4 Relation with others 1 Enjoyment of life 5 What TIME of day is your pain at its worst? evening Sleep (in general) Fair  Pain is worse with: unsure Pain improves with: . Relief from Meds: 8  Mobility use a wheelchair needs help with transfers  Function I need assistance with the following:  feeding, dressing, bathing, toileting, meal prep, household duties and shopping  Neuro/Psych numbness trouble walking dizziness  Prior Studies Any changes since last visit?  no  Physicians involved in your care Any changes since last visit?  no   Family History  Problem Relation Age of Onset  . Heart disease Father        Heart Disease before age 96  . Hyperlipidemia Father   . Hypertension Father   . Alcohol abuse Father   . Heart disease Brother   . Hyperlipidemia Brother   . Hypertension Brother   . Deep  vein thrombosis Brother   . Heart disease Son        Heart Disease before age 88  . Hyperlipidemia Son   . Hypertension Son   . Heart attack Son   . Diabetes Son   . Hypertension Son   . Hyperlipidemia Sister   . Hypertension Sister    Social History   Social History  . Marital status: Married    Spouse name: N/A  . Number of children: N/A  . Years of education: N/A   Social History Main Topics  . Smoking status: Former Smoker    Types: Cigarettes    Quit date: 11/17/1946  . Smokeless tobacco: Never Used     Comment: "never smoked much"  . Alcohol use No  . Drug use: No  . Sexual activity: Not on file   Other Topics Concern  . Not on file   Social History Narrative  . No narrative on file   Past Surgical History:  Procedure Laterality Date  . ABDOMINAL AORTAGRAM N/A 12/26/2014   Procedure: ABDOMINAL Maxcine Ham;  Surgeon: Serafina Mitchell, MD;  [facility] CATH LAB;  Service: Cardiovascular;  Laterality: N/A;  . AMPUTATION Right 12/23/2016   Procedure: AMPUTATION BELOW KNEE;  Surgeon: Serafina Mitchell, MD;  Location: [place];  Service: Vascular;  Laterality: Right;  . AORTOGRAM N/A 09/26/2016   Procedure: AORTOGRAM;  Surgeon: Waynetta Sandy, MD;  Location: [facility]  OR;  Service: Vascular;  Laterality: N/A;  . CARPAL TUNNEL RELEASE Right   . CATARACT EXTRACTION W/ INTRAOCULAR LENS  IMPLANT, BILATERAL Bilateral   . COLONOSCOPY    . DILATION AND CURETTAGE OF UTERUS    . DRESSING CHANGE UNDER ANESTHESIA Right 01/16/2017   Procedure: DRESSING CHANGE RIGHT BELOW KNEE AMPUTATION;  Surgeon: Serafina Mitchell, MD;  Location: [place];  Service: Vascular;  Laterality: Right;  . EYE SURGERY Right    "macular OR"  . FEMORAL ARTERY STENT  12-11-10   Left SFA  . FEMORAL-POPLITEAL BYPASS GRAFT Left 09/24/2016   Procedure: REDO FEMORAL TO POPLITEAL ARTERY BYPASS GRAFT USING 6MM PROPATEN RINGED GORTEX GRAFT;  Surgeon: Serafina Mitchell, MD;  Location: [place];  Service: Vascular;  Laterality: Left;   . FEMORAL-TIBIAL BYPASS GRAFT Left 01/16/2017   Procedure: REDO BYPASS GRAFT FEMORAL-TIBIAL ARTERY WITH GORTEX GRAFT;  Surgeon: Serafina Mitchell, MD;  Location: [place];  Service: Vascular;  Laterality: Left;  AND LOWER LEG   . I&D EXTREMITY Right 12/17/2016   Procedure: IRRIGATION AND DEBRIDEMENT RIGHT FOOT;  Surgeon: Serafina Mitchell, MD;  Location: [place];  Service: Vascular;  Laterality: Right;  . INCISION AND DRAINAGE OF WOUND Left 10/25/2009   leg/notes 11/13/2009  . INSERTION OF ILIAC STENT Left 12/26/2014   Procedure: INSERTION OF ILIAC STENT;  Surgeon: Serafina Mitchell, MD;  Location: [facility] CATH LAB;  Service: Cardiovascular;  Laterality: Left;  . INSERTION OF ILIAC STENT Left 09/26/2016   Procedure: SUB INTIMAL INSERTION OF SUPERFICIAL FEMORAL ARTERY AND BELOW KNEE BYPASS GRAFT;  Surgeon: Waynetta Sandy, MD;  Location: [place];  Service: Vascular;  Laterality: Left;  . JOINT REPLACEMENT     knee  . JOINT REPLACEMENT Left Oct. 17, 2014   Elbow ( pt fell 08-31-13 )  . ORIF SHOULDER FRACTURE Right    "it was crushed"  . PERIPHERAL VASCULAR CATHETERIZATION N/A 10/30/2015   Procedure: Abdominal Aortogram w/Lower Extremity;  Surgeon: Serafina Mitchell, MD;  Location: [place] CV LAB;  Service: Cardiovascular;  Laterality: N/A;  . PERIPHERAL VASCULAR CATHETERIZATION  10/30/2015   Procedure: Peripheral Vascular Intervention;  Surgeon: Serafina Mitchell, MD;  Location: [place] CV LAB;  Service: Cardiovascular;;  . PERIPHERAL VASCULAR CATHETERIZATION N/A 04/01/2016   Procedure: Abdominal Aortogram w/Lower Extremity;  Surgeon: Serafina Mitchell, MD;  Location: [place] CV LAB;  Service: Cardiovascular;  Laterality: N/A;  . PERIPHERAL VASCULAR CATHETERIZATION Left 04/01/2016   Procedure: Peripheral Vascular Atherectomy;  Surgeon: Serafina Mitchell, MD;  Location: [place] CV LAB;  Service: Cardiovascular;  Laterality: Left;  Superficial femoral artery.  Marland Kitchen PERIPHERAL VASCULAR CATHETERIZATION  Right 09/05/2016   "stent"  . PERIPHERAL VASCULAR CATHETERIZATION N/A 09/05/2016   Procedure: Abdominal Aortogram w/Lower Extremity;  Surgeon: Serafina Mitchell, MD;  Location: [place] CV LAB;  Service: Cardiovascular;  Laterality: N/A;  . PERIPHERAL VASCULAR CATHETERIZATION Right 09/05/2016   Procedure: Peripheral Vascular Intervention;  Surgeon: Serafina Mitchell, MD;  Location: [place] CV LAB;  Service: Cardiovascular;  Laterality: Right;  Superficial Femoral  . PERIPHERAL VASCULAR CATHETERIZATION Left 09/09/2016   Procedure: Lower Extremity Angiography;  Surgeon: Serafina Mitchell, MD;  Location: [place] CV LAB;  Service: Cardiovascular;  Laterality: Left;  . PR VEIN BYPASS GRAFT,AORTO-FEM-POP  09-13-09   Left Fem-pop  . THROMBECTOMY FEMORAL ARTERY Right 09/26/2016   Procedure: Thromboembolectomy Right Lower Extremity, Right Femoral Artery Endarterectomy with Patch Angioplasty; Right Lower Extremity Angiogram ;  Surgeon: Erlene Quan  Dione Plover, MD;  Location: [place];  Service: Vascular;  Laterality: Right;  . TOTAL ELBOW ARTHROPLASTY Left 09/03/2013   Procedure: LEFT TOTAL ELBOW ARTHROPLASTY;  Surgeon: Roseanne Kaufman, MD;  Location: [place];  Service: Orthopedics;  Laterality: Left;  . TOTAL KNEE ARTHROPLASTY Left 06/2006  . TRANSMETATARSAL AMPUTATION Right 12/11/2016   Procedure: TRANSMETATARSAL AMPUTATION;  Surgeon: Serafina Mitchell, MD;  Location: [place];  Service: Vascular;  Laterality: Right;  . TUBAL LIGATION    . VAGINAL HYSTERECTOMY     Past Medical History:  Diagnosis Date  . Anemia   . Anginal pain ([place])   . Arthritis    "qwhere" (09/05/2016)  . Atrial fibrillation ([place]) 09/2016  . Chronic lower back pain   . Complication of anesthesia    "takes a long time for it to wear off; I can hallucinate if I take too much" (09/05/2016)  . DVT (deep venous thrombosis) ([place]) 10/2009  . Fall from steps 08/31/2013   Fx. pelvis, Left Hip, Left Elbow  . Fibromyalgia   . GERD  (gastroesophageal reflux disease)    09/22/16- "no too much anymore"  . GI bleed 10/24/2016  . High cholesterol   . History of blood transfusion   . History of hiatal hernia   . Hypertension   . Macular degeneration, wet ([place])    "started in right eye; now legally blind in that eye; now started in left eye but pretty much in control" (09/05/2016)  . Osteoporosis   . Peripheral vascular disease ([place])    nonviable tissue Right foot  . PONV (postoperative nausea and vomiting)   . Squamous cell carcinoma of skin of right calf Aug. 2015  . Stroke ([place])    TIA's no residual  . TIA (transient ischemic attack)    "several at once; none in a long time" (09/05/2016)   There were no vitals taken for this visit.  Opioid Risk Score:   Fall Risk Score:  `1  Depression screen PHQ 2/9  Depression screen PHQ 2/9 02/05/2017  Decreased Interest 3  Down, Depressed, Hopeless 3  PHQ - 2 Score 6  Altered sleeping 1  Tired, decreased energy 3  Change in appetite 0  Feeling bad or failure about yourself  0  Trouble concentrating 3  Moving slowly or fidgety/restless 0  Suicidal thoughts 0  PHQ-9 Score 13  Difficult doing work/chores Extremely dIfficult     Review of Systems  Constitutional: Negative.   HENT: Negative.   Eyes: Negative.   Respiratory: Negative.   Cardiovascular: Negative.   Gastrointestinal: Negative.   Endocrine: Negative.   Genitourinary: Negative.   Musculoskeletal: Positive for joint swelling.  Skin: Negative.   Allergic/Immunologic: Negative.   Neurological: Negative.   Hematological: Negative.   Psychiatric/Behavioral: Negative.   All other systems reviewed and are negative.      Objective:   Physical Exam  Constitutional: She appears well-developed. Frail HENT: Normocephalic and atraumatic.  Eyes: EOMI. No discharge.  Cardiovascular: RRR. Respiratory: normal effort.  GI: Soft. Bowel sounds are normal.  Musculoskeletal: She exhibits edemaand  tenderness. Arthric changes b/l hands. Left foot, pes cavus. Medial arch with palpable cord which is painful to touch.  Neurological: She is alert and oriented.  Motor: B/l UE 4+/5 RLE: HF, KE4+/5 LLE: 4-/5 HE, 4/5 KE, 3-4/5 ADF/PF due to pain---similar Sensation diminished to light touch left foot. Skin. Surgical incision healing by secondary intention with increased granulation tissue. Wet to dry dressing being used. Distal limb sensitive to  touch.   Left lower extremity bypass site clean and dry. Left leg/ankle somewhat sensitive to touch.  Psychiatric: She has a normal mood and affect. Her behavior is normal.   Assessment & Plan:  83 year old right-handed female with history of atrial fibrillation, maintained on Plavix, GI bleed, chronic back pain, hypertension, peripheral vascular disease with femoral popliteal bypass grafting on September 24, 2016, as well as a left BKA presents for transitional care management after receiving CIR for right BKA and left femoral bypass.   1. Decreased functional mobility secondary to occluded left leg bypass status post redo femoral artery exposure 01/16/2017 as well as right BKA 12/23/2016 -shrinker to continue -potential prosthesis/therapy once wound completely healed and edema decreased.   2. Pain Management limit oxycodone as possible. Wean fentanyl as possible  Gabapentin adjusted per [facility]              Daughter will watch closely for sedation/SE's             -continue massage and visual feed back to help with phantom limb pain             -weight bearing as much as possible LLE  3. Anxiety Cont Valium 2.5 mg daily at bedtime as needed  4. Plantar fasciitis   -stretches and information for treatment provided   -appropriate orthotic shoewear/orthosis to control arch is also quite important  5. Hypertension Cont meds Follow up with PCP for  adjustment of meds  Fifteen minutes of face to face patient care time were spent during this visit. All questions were encouraged and answered. Follow up in 3 months.

## 2017-06-10 NOTE — Patient Instructions (Signed)
PLEASE FEEL FREE TO CALL OUR OFFICE WITH ANY PROBLEMS OR QUESTIONS ([phone])      

## 2017-07-02 DIAGNOSIS — G609 Hereditary and idiopathic neuropathy, unspecified: Secondary | ICD-10-CM | POA: Diagnosis not present

## 2017-07-02 DIAGNOSIS — G4700 Insomnia, unspecified: Secondary | ICD-10-CM | POA: Diagnosis not present

## 2017-07-02 DIAGNOSIS — Z89511 Acquired absence of right leg below knee: Secondary | ICD-10-CM | POA: Diagnosis not present

## 2017-07-02 DIAGNOSIS — I739 Peripheral vascular disease, unspecified: Secondary | ICD-10-CM | POA: Diagnosis not present

## 2017-07-02 DIAGNOSIS — G546 Phantom limb syndrome with pain: Secondary | ICD-10-CM | POA: Diagnosis not present

## 2017-07-02 DIAGNOSIS — F4321 Adjustment disorder with depressed mood: Secondary | ICD-10-CM | POA: Diagnosis not present

## 2017-07-02 DIAGNOSIS — G894 Chronic pain syndrome: Secondary | ICD-10-CM | POA: Diagnosis not present

## 2017-07-28 ENCOUNTER — Ambulatory Visit (INDEPENDENT_AMBULATORY_CARE_PROVIDER_SITE_OTHER): Payer: Self-pay | Admitting: Orthopedic Surgery

## 2017-07-30 DIAGNOSIS — I739 Peripheral vascular disease, unspecified: Secondary | ICD-10-CM | POA: Diagnosis not present

## 2017-07-30 DIAGNOSIS — Z89511 Acquired absence of right leg below knee: Secondary | ICD-10-CM | POA: Diagnosis not present

## 2017-07-30 DIAGNOSIS — G894 Chronic pain syndrome: Secondary | ICD-10-CM | POA: Diagnosis not present

## 2017-07-30 DIAGNOSIS — F4321 Adjustment disorder with depressed mood: Secondary | ICD-10-CM | POA: Diagnosis not present

## 2017-08-04 ENCOUNTER — Ambulatory Visit (INDEPENDENT_AMBULATORY_CARE_PROVIDER_SITE_OTHER): Payer: Medicare Other | Admitting: Orthopedic Surgery

## 2017-08-04 ENCOUNTER — Encounter (INDEPENDENT_AMBULATORY_CARE_PROVIDER_SITE_OTHER): Payer: Self-pay | Admitting: Orthopedic Surgery

## 2017-08-04 DIAGNOSIS — Z89511 Acquired absence of right leg below knee: Secondary | ICD-10-CM

## 2017-08-04 DIAGNOSIS — M79604 Pain in right leg: Secondary | ICD-10-CM | POA: Diagnosis not present

## 2017-08-04 DIAGNOSIS — M21372 Foot drop, left foot: Secondary | ICD-10-CM

## 2017-08-04 DIAGNOSIS — I7092 Chronic total occlusion of artery of the extremities: Secondary | ICD-10-CM

## 2017-08-04 NOTE — Progress Notes (Signed)
Office Visit Note   Patient: [name]           Date of Birth: 1928-08-21           MRN: [id number] Visit Date: 08/04/2017              Requested by: Josetta Huddle, MD [address] [place], [place] [address] PCP: Josetta Huddle, MD  Chief Complaint  Patient presents with  . Right Leg - Pain      HPI: Patient is an 83 year old woman who is status post right transtibial amputation as well as status post endovascular reconstruction for the left lower extremity who states that she's been having increasing numbness in the left foot since her revascularization surgery to the left lower extremity. She states she's unable to weight-bear and that her foot turns in with attempted weightbearing. Patient thinks she has nerve damage from her surgery.  Is awaiting fabrication and modifications to DUB and custom orthotic for LLE and prosthesis for right.  Assessment & Plan: Visit Diagnoses:  1. S/P unilateral BKA (below knee amputation), right ([place])   2. Lower extremity pain, right   3. Foot drop, left     Plan: Continue her to use her stump shrinker on the right lower extremity. Recommended knee-high 15-20 mm compression stockings to resolve the venous stasis swelling in the left leg. Patient is currently working with Museum/gallery curator for her prosthesis in the right and a prescription was written for [employer] to fabricate the bracing for this shoe for the left. Referred to robin with [facility].  Follow-Up Instructions: Return in about 3 months (around 11/03/2017), or if symptoms worsen or fail to improve.   Ortho Exam  Patient is alert, oriented, no adenopathy, well-dressed, normal affect, normal respiratory effort. On examination patient is ambulating in a wheelchair. She has venous stasis swelling in the right transtibial amputation she has small ulceration there is no signs of infection. Examination the left foot she has good anterior tibial function and posterior tibial  function as well as Achilles function. The peroneus brevis has some swelling and does not have active motor function. This appears to be mostly due to peroneus brevis tendinopathy. Patient has gross sensation in the left foot with no areas of sensory deficit. She has a strong dorsalis pedis pulse she has dorsiflexion to neutral with her knee extended. Patient has good motor function in the left foot except for the peroneus brevis eversion. Right residual limb is well healed and well consolidated.  Imaging: No results found.  Labs: Lab Results  Component Value Date   HGBA1C  02/11/2009    5.6 (NOTE)   The ADA recommends the following therapeutic goal for glycemic   control related to Hgb A1C measurement:   Goal of Therapy:   < 7.0% Hgb A1C   Reference: American Diabetes Association: Clinical Practice   Recommendations 2008, Diabetes Care,  2008, 31:(Suppl 1).   REPTSTATUS 12/24/2016 FINAL 12/19/2016   GRAMSTAIN  10/25/2009    FEW WBC PRESENT, PREDOMINANTLY PMN NO SQUAMOUS EPITHELIAL CELLS SEEN FEW GRAM POSITIVE COCCI IN PAIRS   CULT NO GROWTH 5 DAYS 12/19/2016   LABORGA PSEUDOMONAS AERUGINOSA (A) 12/16/2016    Orders:  No orders of the defined types were placed in this encounter.  No orders of the defined types were placed in this encounter.    Procedures: No procedures performed  Clinical Data: No additional findings.  ROS:  All other systems negative, except as noted  in the HPI. Review of Systems  Constitutional: Negative for chills and fever.  Neurological: Positive for weakness and numbness.    Objective: Vital Signs: There were no vitals taken for this visit.  Specialty Comments:  No specialty comments available.  PMFS History: Patient Active Problem List   Diagnosis Date Noted  . Plantar fasciitis of left foot 06/10/2017  . Idiopathic chronic venous hypertension of both lower extremities with inflammation 05/21/2017  . Foot drop, left 05/21/2017  . Decubitus  ulcer of sacral region, unstageable ([place])   . Deep tissue injury   . Hypokalemia   . Hypoalbuminemia due to protein-calorie malnutrition ([place])   . Debilitated 01/21/2017  . Neuropathic pain   . Post-operative pain   . Slow transit constipation   . Debility   . Ischemic leg   . PAF (paroxysmal atrial fibrillation) ([place])   . Anemia of chronic disease   . Chronic pain syndrome   . Fibromyalgia   . Stage 3 chronic kidney disease   . Benign essential HTN   . Abnormal urinalysis   . PVD (peripheral vascular disease) ([place]) 01/13/2017  . Post-op pain 01/03/2017  . Phantom limb pain ([place]) 01/03/2017  . S/P unilateral BKA (below knee amputation), right ([place]) 12/30/2016  . Acute on chronic combined systolic and diastolic CHF (congestive heart failure) ([place]) 12/22/2016  . Anemia 12/22/2016  . Palliative care encounter   . Lower extremity pain, right   . Acute GI bleeding 10/23/2016  . Paroxysmal atrial fibrillation ([place]) 10/03/2016  . Atherosclerosis of autologous vein bypass graft of left lower extremity with rest pain ([place]) 09/24/2016  . PAD (peripheral artery disease) ([place]) 09/05/2016  . Groin pain 04/02/2015  . Aftercare following surgery of the circulatory system, [place] 12/13/2013  . Shingles 10/26/2013  . Anxiety 10/26/2013  . Acute blood loss anemia 09/05/2013  . Elbow fracture, left 09/05/2013  . Left acetabular fracture ([place]) 09/05/2013  . Ankle fracture, left 09/05/2013  . HTN (hypertension) 09/03/2013  . HLD (hyperlipidemia) 09/03/2013  . Peripheral vascular disease, unspecified ([place]) 05/10/2012  . Chronic total occlusion of artery of the extremities ([place]) 01/19/2012   Past Medical History:  Diagnosis Date  . Anemia   . Anginal pain ([place])   . Arthritis    "qwhere" (09/05/2016)  . Atrial fibrillation ([place]) 09/2016  . Chronic lower back pain   . Complication of anesthesia    "takes a long time for it to wear off; I can hallucinate if I take too much" (09/05/2016)  . DVT  (deep venous thrombosis) ([place]) 10/2009  . Fall from steps 08/31/2013   Fx. pelvis, Left Hip, Left Elbow  . Fibromyalgia   . GERD (gastroesophageal reflux disease)    09/22/16- "no too much anymore"  . GI bleed 10/24/2016  . High cholesterol   . History of blood transfusion   . History of hiatal hernia   . Hypertension   . Macular degeneration, wet ([place])    "started in right eye; now legally blind in that eye; now started in left eye but pretty much in control" (09/05/2016)  . Osteoporosis   . Peripheral vascular disease ([place])    nonviable tissue Right foot  . PONV (postoperative nausea and vomiting)   . Squamous cell carcinoma of skin of right calf Aug. 2015  . Stroke [facility])    TIA's no residual  . TIA (transient ischemic attack)    "several at once; none in a long time" (09/05/2016)    Family History  Problem Relation Age of Onset  . Heart disease Father        Heart Disease before age 51  . Hyperlipidemia Father   . Hypertension Father   . Alcohol abuse Father   . Heart disease Brother   . Hyperlipidemia Brother   . Hypertension Brother   . Deep vein thrombosis Brother   . Heart disease Son        Heart Disease before age 45  . Hyperlipidemia Son   . Hypertension Son   . Heart attack Son   . Diabetes Son   . Hypertension Son   . Hyperlipidemia Sister   . Hypertension Sister     Past Surgical History:  Procedure Laterality Date  . ABDOMINAL AORTAGRAM N/A 12/26/2014   Procedure: ABDOMINAL Maxcine Ham;  Surgeon: Serafina Mitchell, MD;  Location: [facility] CATH LAB;  Service: Cardiovascular;  Laterality: N/A;  . AMPUTATION Right 12/23/2016   Procedure: AMPUTATION BELOW KNEE;  Surgeon: Serafina Mitchell, MD;  Location: [place];  Service: Vascular;  Laterality: Right;  . AORTOGRAM N/A 09/26/2016   Procedure: AORTOGRAM;  Surgeon: Waynetta Sandy, MD;  Location: [place];  Service: Vascular;  Laterality: N/A;  . CARPAL TUNNEL RELEASE Right   . CATARACT EXTRACTION W/ INTRAOCULAR LENS   IMPLANT, BILATERAL Bilateral   . COLONOSCOPY    . DILATION AND CURETTAGE OF UTERUS    . DRESSING CHANGE UNDER ANESTHESIA Right 01/16/2017   Procedure: DRESSING CHANGE RIGHT BELOW KNEE AMPUTATION;  Surgeon: Serafina Mitchell, MD;  Location: [place];  Service: Vascular;  Laterality: Right;  . EYE SURGERY Right    "macular OR"  . FEMORAL ARTERY STENT  12-11-10   Left SFA  . FEMORAL-POPLITEAL BYPASS GRAFT Left 09/24/2016   Procedure: REDO FEMORAL TO POPLITEAL ARTERY BYPASS GRAFT USING 6MM PROPATEN RINGED GORTEX GRAFT;  Surgeon: Serafina Mitchell, MD;  Location: [place];  Service: Vascular;  Laterality: Left;  . FEMORAL-TIBIAL BYPASS GRAFT Left 01/16/2017   Procedure: REDO BYPASS GRAFT FEMORAL-TIBIAL ARTERY WITH GORTEX GRAFT;  Surgeon: Serafina Mitchell, MD;  Location: [place];  Service: Vascular;  Laterality: Left;  AND LOWER LEG   . I&D EXTREMITY Right 12/17/2016   Procedure: IRRIGATION AND DEBRIDEMENT RIGHT FOOT;  Surgeon: Serafina Mitchell, MD;  Location: [place];  Service: Vascular;  Laterality: Right;  . INCISION AND DRAINAGE OF WOUND Left 10/25/2009   leg/notes 11/13/2009  . INSERTION OF ILIAC STENT Left 12/26/2014   Procedure: INSERTION OF ILIAC STENT;  Surgeon: Serafina Mitchell, MD;  Location: [facility] CATH LAB;  Service: Cardiovascular;  Laterality: Left;  . INSERTION OF ILIAC STENT Left 09/26/2016   Procedure: SUB INTIMAL INSERTION OF SUPERFICIAL FEMORAL ARTERY AND BELOW KNEE BYPASS GRAFT;  Surgeon: Waynetta Sandy, MD;  Location: [place];  Service: Vascular;  Laterality: Left;  . JOINT REPLACEMENT     knee  . JOINT REPLACEMENT Left Oct. 17, 2014   Elbow ( pt fell 08-31-13 )  . ORIF SHOULDER FRACTURE Right    "it was crushed"  . PERIPHERAL VASCULAR CATHETERIZATION N/A 10/30/2015   Procedure: Abdominal Aortogram w/Lower Extremity;  Surgeon: Serafina Mitchell, MD;  Location: [place] CV LAB;  Service: Cardiovascular;  Laterality: N/A;  . PERIPHERAL VASCULAR CATHETERIZATION  10/30/2015   Procedure:  Peripheral Vascular Intervention;  Surgeon: Serafina Mitchell, MD;  Location: [place] CV LAB;  Service: Cardiovascular;;  . PERIPHERAL VASCULAR CATHETERIZATION N/A 04/01/2016   Procedure: Abdominal Aortogram w/Lower Extremity;  Surgeon: Butch Penny  Trula Slade, MD;  Location: [place] CV LAB;  Service: Cardiovascular;  Laterality: N/A;  . PERIPHERAL VASCULAR CATHETERIZATION Left 04/01/2016   Procedure: Peripheral Vascular Atherectomy;  Surgeon: Serafina Mitchell, MD;  Location: [place] CV LAB;  Service: Cardiovascular;  Laterality: Left;  Superficial femoral artery.  Marland Kitchen PERIPHERAL VASCULAR CATHETERIZATION Right 09/05/2016   "stent"  . PERIPHERAL VASCULAR CATHETERIZATION N/A 09/05/2016   Procedure: Abdominal Aortogram w/Lower Extremity;  Surgeon: Serafina Mitchell, MD;  Location: [place] CV LAB;  Service: Cardiovascular;  Laterality: N/A;  . PERIPHERAL VASCULAR CATHETERIZATION Right 09/05/2016   Procedure: Peripheral Vascular Intervention;  Surgeon: Serafina Mitchell, MD;  Location: [place] CV LAB;  Service: Cardiovascular;  Laterality: Right;  Superficial Femoral  . PERIPHERAL VASCULAR CATHETERIZATION Left 09/09/2016   Procedure: Lower Extremity Angiography;  Surgeon: Serafina Mitchell, MD;  Location: [place] CV LAB;  Service: Cardiovascular;  Laterality: Left;  . PR VEIN BYPASS GRAFT,AORTO-FEM-POP  09-13-09   Left Fem-pop  . THROMBECTOMY FEMORAL ARTERY Right 09/26/2016   Procedure: Thromboembolectomy Right Lower Extremity, Right Femoral Artery Endarterectomy with Patch Angioplasty; Right Lower Extremity Angiogram ;  Surgeon: Waynetta Sandy, MD;  Location: [place];  Service: Vascular;  Laterality: Right;  . TOTAL ELBOW ARTHROPLASTY Left 09/03/2013   Procedure: LEFT TOTAL ELBOW ARTHROPLASTY;  Surgeon: Roseanne Kaufman, MD;  Location: [place];  Service: Orthopedics;  Laterality: Left;  . TOTAL KNEE ARTHROPLASTY Left 06/2006  . TRANSMETATARSAL AMPUTATION Right 12/11/2016   Procedure:  TRANSMETATARSAL AMPUTATION;  Surgeon: Serafina Mitchell, MD;  Location: [place];  Service: Vascular;  Laterality: Right;  . TUBAL LIGATION    . VAGINAL HYSTERECTOMY     Social History   Occupational History  . Not on file.   Social History Main Topics  . Smoking status: Former Smoker    Types: Cigarettes    Quit date: 11/17/1946  . Smokeless tobacco: Never Used     Comment: "never smoked much"  . Alcohol use No  . Drug use: No  . Sexual activity: Not on file

## 2017-08-17 ENCOUNTER — Ambulatory Visit: Payer: Medicare Other | Admitting: Surgery

## 2017-08-20 ENCOUNTER — Telehealth (INDEPENDENT_AMBULATORY_CARE_PROVIDER_SITE_OTHER): Payer: Self-pay | Admitting: Orthopedic Surgery

## 2017-08-20 NOTE — Telephone Encounter (Signed)
Last 3 ov notes faxed to [facility] clinic [phone]

## 2017-08-31 ENCOUNTER — Encounter: Payer: Self-pay | Admitting: Surgery

## 2017-08-31 ENCOUNTER — Ambulatory Visit (INDEPENDENT_AMBULATORY_CARE_PROVIDER_SITE_OTHER): Payer: Medicare Other | Admitting: Surgery

## 2017-08-31 VITALS — BP 149/78 | HR 77 | Temp 97.8°F | Resp 18 | Ht 62.0 in

## 2017-08-31 DIAGNOSIS — I7092 Chronic total occlusion of artery of the extremities: Secondary | ICD-10-CM

## 2017-08-31 DIAGNOSIS — I70235 Atherosclerosis of native arteries of right leg with ulceration of other part of foot: Secondary | ICD-10-CM

## 2017-08-31 NOTE — Progress Notes (Signed)
[facility] [place]  Patient name: [name] MRN: [id number] DOB: 1928/04/25 Sex: female   REASON FOR VISIT:    Follow up  HISOTRY OF PRESENT ILLNESS:   [name] is a 83 y.o. female with multiple revascularization attempts bilaterally as listed below:   1. October 2010:She had an above-knee to below-knee popliteal artery bypass with vein in the left leg.  2. 12/26/2014: She had angioplasty and stenting of the left external iliac artery with angioplasty and stenting of the left superficial femoral artery.  3. 10/30/2015: She had a stent in the left superficial femoral artery and also angioplasty of an in stent stenosis in the left popliteal artery.  4. 04/01/2016: She had atherectomy of the left superficial femoral artery and popliteal artery and also drug coated balloon angioplasty of the left superficial femoral artery and popliteal artery.  5. 09/24/2016: The patient had a redo left femoral to above-knee popliteal artery bypass with a 6 mm PTFE graft. She also had left common femoral artery external iliac artery and profunda femoral endarterectomy.  6. 09/26/2016: The redo femoropopliteal bypass graft failed early and she underwent subintimal angioplasty of the left superficial femoral artery and below-knee popliteal artery stenting with multiple viabahn grafts.   7. 09/27/2016: She had right common femoral artery endarterectomy with bovine pericardial patch angioplasty and right lower extremity thromboembolectomy.  8. 12-23-2016: Right BKA  9. Left profunda to AT BPG with PTFEon the right side which ultimately resulted in right below knee amputation.  She now is undergoing the process of prosthesis training.  She actually walked with a prosthesis down the handrail last week.  She is still having difficulty with pain on her left leg with the brace.  PAST MEDICAL HISTORY:   Past Medical History:   Diagnosis Date  . Anemia   . Anginal pain ([place])   . Arthritis    "qwhere" (09/05/2016)  . Atrial fibrillation ([place]) 09/2016  . Chronic lower back pain   . Complication of anesthesia    "takes a long time for it to wear off; I can hallucinate if I take too much" (09/05/2016)  . DVT (deep venous thrombosis) ([place]) 10/2009  . Fall from steps 08/31/2013   Fx. pelvis, Left Hip, Left Elbow  . Fibromyalgia   . GERD (gastroesophageal reflux disease)    09/22/16- "no too much anymore"  . GI bleed 10/24/2016  . High cholesterol   . History of blood transfusion   . History of hiatal hernia   . Hypertension   . Macular degeneration, wet ([place])    "started in right eye; now legally blind in that eye; now started in left eye but pretty much in control" (09/05/2016)  . Osteoporosis   . Peripheral vascular disease ([place])    nonviable tissue Right foot  . PONV (postoperative nausea and vomiting)   . Squamous cell carcinoma of skin of right calf Aug. 2015  . Stroke ([place])    TIA's no residual  . TIA (transient ischemic attack)    "several at once; none in a long time" (09/05/2016)     FAMILY HISTORY:   Family History  Problem Relation Age of Onset  . Heart disease Father        Heart Disease before age 13  . Hyperlipidemia Father   . Hypertension Father   . Alcohol abuse Father   . Heart disease Brother   . Hyperlipidemia Brother   . Hypertension Brother   . Deep  vein thrombosis Brother   . Heart disease Son        Heart Disease before age 90  . Hyperlipidemia Son   . Hypertension Son   . Heart attack Son   . Diabetes Son   . Hypertension Son   . Hyperlipidemia Sister   . Hypertension Sister     SOCIAL HISTORY:   Social History  Substance Use Topics  . Smoking status: Former Smoker    Types: Cigarettes    Quit date: 11/17/1946  . Smokeless tobacco: Never Used     Comment: "never smoked much"  . Alcohol use No     ALLERGIES:   Allergies  Allergen Reactions  . Motrin  [Ibuprofen] Other (See Comments)    ADVERSE REACTION - GI BLEED  . Statins Other (See Comments)    ADVERSE REACTION MUSCLE PAIN & WEAKNESS  . Eliquis [Apixaban] Other (See Comments)    Lower GI bleeding  . Morphine And Related Other (See Comments)    HALLUCINATIONS REACTION IS SIDE EFFECT  . Oxycontin [Oxycodone Hcl] Other (See Comments)    [REACTION IS SIDE EFFECT]  > HALLUCINATIONS  . Promethazine Hcl Other (See Comments)    IV  Drug only, makes her act crazy  . Sulfa Antibiotics Nausea And Vomiting     CURRENT MEDICATIONS:   Current Outpatient Prescriptions  Medication Sig Dispense Refill  . acetaminophen (TYLENOL) 500 MG tablet Take 1,000 mg by mouth 2 (two) times daily as needed for moderate pain or headache. Patient took this medication for her pain.    . Cholecalciferol (VITAMIN D3) 2000 UNITS TABS Take 2,000 Units by mouth at bedtime.     . clopidogrel (PLAVIX) 75 MG tablet Take 1 tablet (75 mg total) by mouth daily. 30 tablet 1  . collagenase (SANTYL) ointment Apply topically daily. Applied to sacral area daily 15 g 0  . Cyanocobalamin (VITAMIN B-12 PO) Take 1 tablet by mouth daily.    . diazepam (VALIUM) 5 MG tablet Take 0.5 tablets (2.5 mg total) by mouth at bedtime as needed for anxiety. 20 tablet 0  . docusate sodium (COLACE) 100 MG capsule Take 1 capsule (100 mg total) by mouth daily. 10 capsule 0  . DULoxetine (CYMBALTA) 20 MG capsule     . Hydrocortisone (GERHARDT'S BUTT CREAM) CREA Apply 1 application topically 2 (two) times daily. 1 each 1  . loperamide (IMODIUM) 2 MG capsule Take 2 mg by mouth as needed for diarrhea or loose stools.    . methocarbamol (ROBAXIN) 500 MG tablet Take 1 tablet (500 mg total) by mouth every 6 (six) hours as needed for muscle spasms. 60 tablet 0  . metoprolol tartrate (LOPRESSOR) 25 MG tablet Take 1 tablet (25 mg total) by mouth at bedtime. 30 tablet 1  . Multiple Vitamins-Minerals (EYE VITAMINS PO) Take 1 tablet by mouth at bedtime.       Marland Kitchen oxyCODONE (OXY IR/ROXICODONE) 5 MG immediate release tablet Take 1 tablet (5 mg total) by mouth every 4 (four) hours as needed for moderate pain, severe pain or breakthrough pain. 30 tablet 0  . valsartan-hydrochlorothiazide (DIOVAN-HCT) 160-12.5 MG tablet Take 0.5 tablets by mouth daily. 30 tablet 0  . gabapentin (NEURONTIN) 600 MG tablet Take 1 tablet (600 mg total) by mouth as directed. 1 tablet twice daily and 1.5 to 2 tablets at night 120 tablet 1   No current facility-administered medications for this visit.     REVIEW OF SYSTEMS:   [X]  denotes positive finding, [ ]   denotes negative finding Cardiac  Comments:  Chest pain or chest pressure:    Shortness of breath upon exertion:    Short of breath when lying flat:    Irregular heart rhythm:        Vascular    Pain in calf, thigh, or hip brought on by ambulation:    Pain in feet at night that wakes you up from your sleep:     Blood clot in your veins:    Leg swelling:         Pulmonary    Oxygen at home:    Productive cough:     Wheezing:         Neurologic    Sudden weakness in arms or legs:     Sudden numbness in arms or legs:     Sudden onset of difficulty speaking or slurred speech:    Temporary loss of vision in one eye:     Problems with dizziness:         Gastrointestinal    Blood in stool:     Vomited blood:         Genitourinary    Burning when urinating:     Blood in urine:        Psychiatric    Major depression:         Hematologic    Bleeding problems:    Problems with blood clotting too easily:        Skin    Rashes or ulcers:        Constitutional    Fever or chills:      PHYSICAL EXAM:   Vitals:   08/31/17 1205  BP: (!) 149/78  Pulse: 77  Resp: 18  Temp: 97.8 F (36.6 C)  TempSrc: Oral  SpO2: 98%  Height: 5\' 2"  (1.575 m)    GENERAL: The patient is a well-nourished female, in no acute distress. The vital signs are documented above. CARDIAC: There is a regular rate and rhythm.   VASCULAR: Palpable left dorsalis pedis pulse PULMONARY: Non-labored respirations.  MUSCULOSKELETAL: There are no major deformities or cyanosis. NEUROLOGIC: No focal weakness or paresthesias are detected. SKIN: There are no ulcers or rashes noted. PSYCHIATRIC: The patient has a normal affect.  STUDIES:   None  MEDICAL ISSUES:   The patient continues to make significant progress.  She has been fitted for prosthesis and has actually been able to bear weight down a hand-held rail.  Unfortunately she continues to have difficulty with her left leg and wearing a brace.  She has significantly cut back on her pain medicine.  Overall very pleased with the progress that she has made.  I'm going to have her follow-up in 6 months with a duplex of her bypass graft.    [name], MD [facility] [phone] Pager [phone]

## 2017-09-01 DIAGNOSIS — I739 Peripheral vascular disease, unspecified: Secondary | ICD-10-CM | POA: Diagnosis not present

## 2017-09-01 DIAGNOSIS — Z0001 Encounter for general adult medical examination with abnormal findings: Secondary | ICD-10-CM | POA: Diagnosis not present

## 2017-09-01 DIAGNOSIS — Z79899 Other long term (current) drug therapy: Secondary | ICD-10-CM | POA: Diagnosis not present

## 2017-09-01 DIAGNOSIS — G894 Chronic pain syndrome: Secondary | ICD-10-CM | POA: Diagnosis not present

## 2017-09-01 DIAGNOSIS — E876 Hypokalemia: Secondary | ICD-10-CM | POA: Diagnosis not present

## 2017-09-01 DIAGNOSIS — I10 Essential (primary) hypertension: Secondary | ICD-10-CM | POA: Diagnosis not present

## 2017-09-01 DIAGNOSIS — F064 Anxiety disorder due to known physiological condition: Secondary | ICD-10-CM | POA: Diagnosis not present

## 2017-09-01 DIAGNOSIS — Z23 Encounter for immunization: Secondary | ICD-10-CM | POA: Diagnosis not present

## 2017-09-01 DIAGNOSIS — Z1389 Encounter for screening for other disorder: Secondary | ICD-10-CM | POA: Diagnosis not present

## 2017-09-01 DIAGNOSIS — E559 Vitamin D deficiency, unspecified: Secondary | ICD-10-CM | POA: Diagnosis not present

## 2017-09-01 DIAGNOSIS — E782 Mixed hyperlipidemia: Secondary | ICD-10-CM | POA: Diagnosis not present

## 2017-09-01 DIAGNOSIS — G629 Polyneuropathy, unspecified: Secondary | ICD-10-CM | POA: Diagnosis not present

## 2017-09-01 NOTE — Addendum Note (Signed)
Addended by: Lianne Cure A on: 09/01/2017 03:58 PM   Modules accepted: Orders

## 2017-09-02 DIAGNOSIS — I10 Essential (primary) hypertension: Secondary | ICD-10-CM | POA: Diagnosis not present

## 2017-09-09 ENCOUNTER — Ambulatory Visit: Payer: Medicare Other | Attending: Family | Admitting: Physical Therapy

## 2017-09-09 ENCOUNTER — Encounter: Payer: Self-pay | Admitting: Physical Therapy

## 2017-09-09 ENCOUNTER — Encounter: Payer: Self-pay | Admitting: Physical Medicine & Rehabilitation

## 2017-09-09 ENCOUNTER — Encounter: Payer: Medicare Other | Attending: Physical Medicine & Rehabilitation | Admitting: Physical Medicine & Rehabilitation

## 2017-09-09 VITALS — BP 130/75 | HR 85

## 2017-09-09 DIAGNOSIS — I7092 Chronic total occlusion of artery of the extremities: Secondary | ICD-10-CM

## 2017-09-09 DIAGNOSIS — I10 Essential (primary) hypertension: Secondary | ICD-10-CM | POA: Diagnosis not present

## 2017-09-09 DIAGNOSIS — Z86718 Personal history of other venous thrombosis and embolism: Secondary | ICD-10-CM | POA: Diagnosis not present

## 2017-09-09 DIAGNOSIS — K449 Diaphragmatic hernia without obstruction or gangrene: Secondary | ICD-10-CM | POA: Insufficient documentation

## 2017-09-09 DIAGNOSIS — I739 Peripheral vascular disease, unspecified: Secondary | ICD-10-CM | POA: Diagnosis not present

## 2017-09-09 DIAGNOSIS — G8929 Other chronic pain: Secondary | ICD-10-CM | POA: Diagnosis not present

## 2017-09-09 DIAGNOSIS — M797 Fibromyalgia: Secondary | ICD-10-CM | POA: Diagnosis not present

## 2017-09-09 DIAGNOSIS — M722 Plantar fascial fibromatosis: Secondary | ICD-10-CM | POA: Insufficient documentation

## 2017-09-09 DIAGNOSIS — Z95828 Presence of other vascular implants and grafts: Secondary | ICD-10-CM | POA: Diagnosis not present

## 2017-09-09 DIAGNOSIS — Z89512 Acquired absence of left leg below knee: Secondary | ICD-10-CM | POA: Insufficient documentation

## 2017-09-09 DIAGNOSIS — R2681 Unsteadiness on feet: Secondary | ICD-10-CM | POA: Diagnosis not present

## 2017-09-09 DIAGNOSIS — Z89511 Acquired absence of right leg below knee: Secondary | ICD-10-CM

## 2017-09-09 DIAGNOSIS — R269 Unspecified abnormalities of gait and mobility: Secondary | ICD-10-CM | POA: Insufficient documentation

## 2017-09-09 DIAGNOSIS — Z85828 Personal history of other malignant neoplasm of skin: Secondary | ICD-10-CM | POA: Diagnosis not present

## 2017-09-09 DIAGNOSIS — Z87891 Personal history of nicotine dependence: Secondary | ICD-10-CM | POA: Diagnosis not present

## 2017-09-09 DIAGNOSIS — M545 Low back pain: Secondary | ICD-10-CM | POA: Diagnosis not present

## 2017-09-09 DIAGNOSIS — I4891 Unspecified atrial fibrillation: Secondary | ICD-10-CM | POA: Diagnosis not present

## 2017-09-09 DIAGNOSIS — Z7902 Long term (current) use of antithrombotics/antiplatelets: Secondary | ICD-10-CM | POA: Insufficient documentation

## 2017-09-09 DIAGNOSIS — E7800 Pure hypercholesterolemia, unspecified: Secondary | ICD-10-CM | POA: Insufficient documentation

## 2017-09-09 DIAGNOSIS — R531 Weakness: Secondary | ICD-10-CM | POA: Diagnosis not present

## 2017-09-09 DIAGNOSIS — R2689 Other abnormalities of gait and mobility: Secondary | ICD-10-CM | POA: Diagnosis not present

## 2017-09-09 DIAGNOSIS — M6281 Muscle weakness (generalized): Secondary | ICD-10-CM | POA: Insufficient documentation

## 2017-09-09 DIAGNOSIS — F419 Anxiety disorder, unspecified: Secondary | ICD-10-CM | POA: Insufficient documentation

## 2017-09-09 DIAGNOSIS — R200 Anesthesia of skin: Secondary | ICD-10-CM | POA: Insufficient documentation

## 2017-09-09 DIAGNOSIS — Z8673 Personal history of transient ischemic attack (TIA), and cerebral infarction without residual deficits: Secondary | ICD-10-CM | POA: Diagnosis not present

## 2017-09-09 DIAGNOSIS — M79605 Pain in left leg: Secondary | ICD-10-CM | POA: Diagnosis not present

## 2017-09-09 DIAGNOSIS — M79604 Pain in right leg: Secondary | ICD-10-CM | POA: Diagnosis not present

## 2017-09-09 DIAGNOSIS — K219 Gastro-esophageal reflux disease without esophagitis: Secondary | ICD-10-CM | POA: Diagnosis not present

## 2017-09-09 DIAGNOSIS — Z7409 Other reduced mobility: Secondary | ICD-10-CM

## 2017-09-09 DIAGNOSIS — M6249 Contracture of muscle, multiple sites: Secondary | ICD-10-CM | POA: Insufficient documentation

## 2017-09-09 DIAGNOSIS — R42 Dizziness and giddiness: Secondary | ICD-10-CM | POA: Diagnosis not present

## 2017-09-09 NOTE — Patient Instructions (Signed)
PLEASE FEEL FREE TO CALL OUR OFFICE WITH ANY PROBLEMS OR QUESTIONS ([phone])      

## 2017-09-09 NOTE — Progress Notes (Signed)
Subjective:    Patient ID: [name], female    DOB: 12/25/1927, 83 y.o.   MRN: [id number]  HPI   [name] is here in follo wup of her chronic gait disorder, right bka. She starts PT today. Prosthetic has been made. She has worn it at home on a few occasions. Left leg has been most problematic particularly at the knee and left foot. Arch remains tender to weight bearing and stretching. It's tender as well when she uses her AFO. The prosthesis seems to be fititng appropriately. She has stood in the parallel bars with it so far.   She is no longer seeing pain mgt. Her primary is filling pain medicines currently.    Pain Inventory Average Pain 5 Pain Right Now 1 My pain is sharp and aching  In the last 24 hours, has pain interfered with the following? General activity 3 Relation with others 2 Enjoyment of life 2 What TIME of day is your pain at its worst? evening Sleep (in general) Fair  Pain is worse with: standing and some activites Pain improves with: rest and medication Relief from Meds: 8  Mobility ability to climb steps?  no do you drive?  no  Function retired I need assistance with the following:  feeding, dressing, bathing, toileting, meal prep, household duties and shopping  Neuro/Psych weakness numbness trouble walking  Prior Studies Any changes since last visit?  no  Physicians involved in your care Any changes since last visit?  no   Family History  Problem Relation Age of Onset  . Heart disease Father        Heart Disease before age 58  . Hyperlipidemia Father   . Hypertension Father   . Alcohol abuse Father   . Heart disease Brother   . Hyperlipidemia Brother   . Hypertension Brother   . Deep vein thrombosis Brother   . Heart disease Son        Heart Disease before age 77  . Hyperlipidemia Son   . Hypertension Son   . Heart attack Son   . Diabetes Son   . Hypertension Son   . Hyperlipidemia Sister   . Hypertension Sister     Social History   Social History  . Marital status: Married    Spouse name: N/A  . Number of children: N/A  . Years of education: N/A   Social History Main Topics  . Smoking status: Former Smoker    Types: Cigarettes    Quit date: 11/17/1946  . Smokeless tobacco: Never Used     Comment: "never smoked much"  . Alcohol use No  . Drug use: No  . Sexual activity: Not Asked   Other Topics Concern  . None   Social History Narrative  . None   Past Surgical History:  Procedure Laterality Date  . ABDOMINAL AORTAGRAM N/A 12/26/2014   Procedure: ABDOMINAL Maxcine Ham;  Surgeon: Serafina Mitchell, MD;  Location: [facility] CATH LAB;  Service: Cardiovascular;  Laterality: N/A;  . AMPUTATION Right 12/23/2016   Procedure: AMPUTATION BELOW KNEE;  Surgeon: Serafina Mitchell, MD;  Location: [place];  Service: Vascular;  Laterality: Right;  . AORTOGRAM N/A 09/26/2016   Procedure: AORTOGRAM;  Surgeon: Waynetta Sandy, MD;  Location: [place];  Service: Vascular;  Laterality: N/A;  . CARPAL TUNNEL RELEASE Right   . CATARACT EXTRACTION W/ INTRAOCULAR LENS  IMPLANT, BILATERAL Bilateral   . COLONOSCOPY    . DILATION AND CURETTAGE OF UTERUS    .  DRESSING CHANGE UNDER ANESTHESIA Right 01/16/2017   Procedure: DRESSING CHANGE RIGHT BELOW KNEE AMPUTATION;  Surgeon: Serafina Mitchell, MD;  Location: [place];  Service: Vascular;  Laterality: Right;  . EYE SURGERY Right    "macular OR"  . FEMORAL ARTERY STENT  12-11-10   Left SFA  . FEMORAL-POPLITEAL BYPASS GRAFT Left 09/24/2016   Procedure: REDO FEMORAL TO POPLITEAL ARTERY BYPASS GRAFT USING 6MM PROPATEN RINGED GORTEX GRAFT;  Surgeon: Serafina Mitchell, MD;  Location: [place];  Service: Vascular;  Laterality: Left;  . FEMORAL-TIBIAL BYPASS GRAFT Left 01/16/2017   Procedure: REDO BYPASS GRAFT FEMORAL-TIBIAL ARTERY WITH GORTEX GRAFT;  Surgeon: Serafina Mitchell, MD;  Location: [place];  Service: Vascular;  Laterality: Left;  AND LOWER LEG   . I&D EXTREMITY Right 12/17/2016    Procedure: IRRIGATION AND DEBRIDEMENT RIGHT FOOT;  Surgeon: Serafina Mitchell, MD;  Location: [place];  Service: Vascular;  Laterality: Right;  . INCISION AND DRAINAGE OF WOUND Left 10/25/2009   leg/notes 11/13/2009  . INSERTION OF ILIAC STENT Left 12/26/2014   Procedure: INSERTION OF ILIAC STENT;  Surgeon: Serafina Mitchell, MD;  Location: [facility] CATH LAB;  Service: Cardiovascular;  Laterality: Left;  . INSERTION OF ILIAC STENT Left 09/26/2016   Procedure: SUB INTIMAL INSERTION OF SUPERFICIAL FEMORAL ARTERY AND BELOW KNEE BYPASS GRAFT;  Surgeon: Waynetta Sandy, MD;  Location: [place];  Service: Vascular;  Laterality: Left;  . JOINT REPLACEMENT     knee  . JOINT REPLACEMENT Left Oct. 17, 2014   Elbow ( pt fell 08-31-13 )  . ORIF SHOULDER FRACTURE Right    "it was crushed"  . PERIPHERAL VASCULAR CATHETERIZATION N/A 10/30/2015   Procedure: Abdominal Aortogram w/Lower Extremity;  Surgeon: Serafina Mitchell, MD;  Location: [place] CV LAB;  Service: Cardiovascular;  Laterality: N/A;  . PERIPHERAL VASCULAR CATHETERIZATION  10/30/2015   Procedure: Peripheral Vascular Intervention;  Surgeon: Serafina Mitchell, MD;  Location: [place] CV LAB;  Service: Cardiovascular;;  . PERIPHERAL VASCULAR CATHETERIZATION N/A 04/01/2016   Procedure: Abdominal Aortogram w/Lower Extremity;  Surgeon: Serafina Mitchell, MD;  Location: [place] CV LAB;  Service: Cardiovascular;  Laterality: N/A;  . PERIPHERAL VASCULAR CATHETERIZATION Left 04/01/2016   Procedure: Peripheral Vascular Atherectomy;  Surgeon: Serafina Mitchell, MD;  Location: [place] CV LAB;  Service: Cardiovascular;  Laterality: Left;  Superficial femoral artery.  Marland Kitchen PERIPHERAL VASCULAR CATHETERIZATION Right 09/05/2016   "stent"  . PERIPHERAL VASCULAR CATHETERIZATION N/A 09/05/2016   Procedure: Abdominal Aortogram w/Lower Extremity;  Surgeon: Serafina Mitchell, MD;  Location: [place] CV LAB;  Service: Cardiovascular;  Laterality: N/A;  . PERIPHERAL  VASCULAR CATHETERIZATION Right 09/05/2016   Procedure: Peripheral Vascular Intervention;  Surgeon: Serafina Mitchell, MD;  Location: [place] CV LAB;  Service: Cardiovascular;  Laterality: Right;  Superficial Femoral  . PERIPHERAL VASCULAR CATHETERIZATION Left 09/09/2016   Procedure: Lower Extremity Angiography;  Surgeon: Serafina Mitchell, MD;  Location: [place] CV LAB;  Service: Cardiovascular;  Laterality: Left;  . PR VEIN BYPASS GRAFT,AORTO-FEM-POP  09-13-09   Left Fem-pop  . THROMBECTOMY FEMORAL ARTERY Right 09/26/2016   Procedure: Thromboembolectomy Right Lower Extremity, Right Femoral Artery Endarterectomy with Patch Angioplasty; Right Lower Extremity Angiogram ;  Surgeon: Waynetta Sandy, MD;  Location: [place];  Service: Vascular;  Laterality: Right;  . TOTAL ELBOW ARTHROPLASTY Left 09/03/2013   Procedure: LEFT TOTAL ELBOW ARTHROPLASTY;  Surgeon: Roseanne Kaufman, MD;  Location: [place];  Service: Orthopedics;  Laterality: Left;  .  TOTAL KNEE ARTHROPLASTY Left 06/2006  . TRANSMETATARSAL AMPUTATION Right 12/11/2016   Procedure: TRANSMETATARSAL AMPUTATION;  Surgeon: Serafina Mitchell, MD;  Location: [place];  Service: Vascular;  Laterality: Right;  . TUBAL LIGATION    . VAGINAL HYSTERECTOMY     Past Medical History:  Diagnosis Date  . Anemia   . Anginal pain ([place])   . Arthritis    "qwhere" (09/05/2016)  . Atrial fibrillation ([place]) 09/2016  . Chronic lower back pain   . Complication of anesthesia    "takes a long time for it to wear off; I can hallucinate if I take too much" (09/05/2016)  . DVT (deep venous thrombosis) ([place]) 10/2009  . Fall from steps 08/31/2013   Fx. pelvis, Left Hip, Left Elbow  . Fibromyalgia   . GERD (gastroesophageal reflux disease)    09/22/16- "no too much anymore"  . GI bleed 10/24/2016  . High cholesterol   . History of blood transfusion   . History of hiatal hernia   . Hypertension   . 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 Osteoporosis   . Peripheral vascular disease ([place])    nonviable tissue Right foot  . PONV (postoperative nausea and vomiting)   . Squamous cell carcinoma of skin of right calf Aug. 2015  . Stroke ([place])    TIA's no residual  . TIA (transient ischemic attack)    "several at once; none in a long time" (09/05/2016)   BP 130/75   Pulse 85   SpO2 97%   Opioid Risk Score:   Fall Risk Score:  `1  Depression screen PHQ 2/9  Depression screen PHQ 2/9 02/05/2017  Decreased Interest 3  Down, Depressed, Hopeless 3  PHQ - 2 Score 6  Altered sleeping 1  Tired, decreased energy 3  Change in appetite 0  Feeling bad or failure about yourself  0  Trouble concentrating 3  Moving slowly or fidgety/restless 0  Suicidal thoughts 0  PHQ-9 Score 13  Difficult doing work/chores Extremely dIfficult     Review of Systems  Constitutional: Positive for unexpected weight change.  HENT: Negative.   Eyes: Negative.   Respiratory: Negative.   Cardiovascular: Negative.   Gastrointestinal: Negative.   Endocrine: Negative.   Genitourinary: Negative.   Musculoskeletal: Positive for joint swelling.  Skin: Negative.   Allergic/Immunologic: Negative.   Neurological: Negative.   Hematological: Negative.   Psychiatric/Behavioral: Negative.        Objective:   Physical Exam  Constitutional: She appears well-developed. Frail HENT: Normocephalic and atraumatic.  Eyes: EOMI. No discharge.  Cardiovascular: RRR Respiratory: normal respiration GI: Soft. Bowel sounds are normal.  Musculoskeletal: She exhibits edemaand tenderness. Arthric changes b/l hands. Left foot, pes cavus. Medial arch tender. Pes cavus. Toes hyperextended.  Neurological: She is alert and oriented.  Motor: B/l UE 4+/5 RLE: HF, KE4+/5 LLE: 4-/5 HE, 4/5 KE, 3-4/5 ADF/PF due to pain---similar Sensation diminished to light touch left  foot. Skin. Wounds healed. Chronic vascular changes.  Psychiatric: She has a normal mood and affect. Her behavior is normal.   Assessment & Plan:  83 year old right-handed female with history of atrial fibrillation, maintained on Plavix, GI bleed, chronic back pain, hypertension, peripheral vascular disease with femoral popliteal bypass grafting on September 24, 2016, as well as a left BKA presents for transitional care management after receiving CIR for right  BKA and left femoral bypass.   1. Decreased functional mobility secondary to occluded left leg bypass status post redo femoral artery exposure 01/16/2017 as well as right BKA 12/23/2016 -shrinker to continue -prosthetic training to begin today  -needs to use AFO with gait. May need custom orthotics for shoe or special shoe wear  -consider botox to EDB, EHL to avoid excessive toe extension.   2. Pain Management  oxycodone per primary Gabapentin for neuropathic pain   3. Anxiety Cont Valium 2.5 mg daily at bedtime as needed  4. Plantar fasciitis, pes cavus              -stretches and information for treatment provided                  -appropriate orthotic shoewear/orthosis to control arch is also quite important  -consider botox to toe extensors to help with shoe fit 5. Hypertension per primary  Fifteen minutes of face to face patient care time were spent during this visit. All questions were encouraged and answered. Follow up me PRN. I instructed them to have PT contact me if any issues arise

## 2017-09-10 NOTE — Therapy (Signed)
[facility] [address] [place], [place], [address] Phone: [phone]   Fax:  [phone]  Physical Therapy Evaluation  Patient Details  Name: [name] MRN: [id number] Date of Birth: Sep 25, 1928 Referring Provider: Alger Simons, MD  (dtr requested use of Dr. Naaman Plummer for referral) [name], NP & [name], MD)  Encounter Date: 09/09/2017      PT End of Session - 09/09/17 1626    Visit Number 1   Number of Visits 17   Date for PT Re-Evaluation 11/06/17   Authorization Type Medicare G-code & progress note every 10th visit   PT Start Time 1510   PT Stop Time 1610   PT Time Calculation (min) 60 min   Equipment Utilized During Treatment Gait belt   Activity Tolerance Patient limited by pain;Patient limited by fatigue   Behavior During Therapy [facility] for tasks assessed/performed      Past Medical History:  Diagnosis Date  . Anemia   . Anginal pain ([place])   . Arthritis    "qwhere" (09/05/2016)  . Atrial fibrillation ([place]) 09/2016  . Chronic lower back pain   . Complication of anesthesia    "takes a long time for it to wear off; I can hallucinate if I take too much" (09/05/2016)  . DVT (deep venous thrombosis) ([place]) 10/2009  . Fall from steps 08/31/2013   Fx. pelvis, Left Hip, Left Elbow  . Fibromyalgia   . GERD (gastroesophageal reflux disease)    09/22/16- "no too much anymore"  . GI bleed 10/24/2016  . High cholesterol   . History of blood transfusion   . History of hiatal hernia   . Hypertension   . 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 Osteoporosis   . Peripheral vascular disease ([place])    nonviable tissue Right foot  . PONV (postoperative nausea and vomiting)   . Squamous cell carcinoma of skin of right calf Aug. 2015  . Stroke ([place])    TIA's no residual  . TIA  (transient ischemic attack)    "several at once; none in a long time" (09/05/2016)    Past Surgical History:  Procedure Laterality Date  . ABDOMINAL AORTAGRAM N/A 12/26/2014   Procedure: ABDOMINAL Maxcine Ham;  Surgeon: Serafina Mitchell, MD;  Location: [facility] CATH LAB;  Service: Cardiovascular;  Laterality: N/A;  . AMPUTATION Right 12/23/2016   Procedure: AMPUTATION BELOW KNEE;  Surgeon: Serafina Mitchell, MD;  Location: [place];  Service: Vascular;  Laterality: Right;  . AORTOGRAM N/A 09/26/2016   Procedure: AORTOGRAM;  Surgeon: Waynetta Sandy, MD;  Location: [place];  Service: Vascular;  Laterality: N/A;  . CARPAL TUNNEL RELEASE Right   . CATARACT EXTRACTION W/ INTRAOCULAR LENS  IMPLANT, BILATERAL Bilateral   . COLONOSCOPY    . DILATION AND CURETTAGE OF UTERUS    . DRESSING CHANGE UNDER ANESTHESIA Right 01/16/2017   Procedure: DRESSING CHANGE RIGHT BELOW KNEE AMPUTATION;  Surgeon: Serafina Mitchell, MD;  Location: [place];  Service: Vascular;  Laterality: Right;  . EYE SURGERY Right    "macular OR"  . FEMORAL ARTERY STENT  12-11-10   Left SFA  . FEMORAL-POPLITEAL BYPASS GRAFT Left 09/24/2016   Procedure: REDO FEMORAL TO POPLITEAL ARTERY BYPASS GRAFT USING 6MM PROPATEN RINGED GORTEX GRAFT;  Surgeon: Serafina Mitchell, MD;  Location: [place];  Service: Vascular;  Laterality: Left;  . FEMORAL-TIBIAL BYPASS GRAFT Left 01/16/2017   Procedure: REDO BYPASS GRAFT FEMORAL-TIBIAL ARTERY WITH GORTEX GRAFT;  Surgeon: Serafina Mitchell, MD;  Location: [place];  Service: Vascular;  Laterality: Left;  AND LOWER LEG   . I&D EXTREMITY Right 12/17/2016   Procedure: IRRIGATION AND DEBRIDEMENT RIGHT FOOT;  Surgeon: Serafina Mitchell, MD;  Location: [place];  Service: Vascular;  Laterality: Right;  . INCISION AND DRAINAGE OF WOUND Left 10/25/2009   leg/notes 11/13/2009  . INSERTION OF ILIAC STENT Left 12/26/2014   Procedure: INSERTION OF ILIAC STENT;  Surgeon: Serafina Mitchell, MD;  Location: [facility] CATH LAB;  Service: Cardiovascular;   Laterality: Left;  . INSERTION OF ILIAC STENT Left 09/26/2016   Procedure: SUB INTIMAL INSERTION OF SUPERFICIAL FEMORAL ARTERY AND BELOW KNEE BYPASS GRAFT;  Surgeon: Waynetta Sandy, MD;  Location: [place];  Service: Vascular;  Laterality: Left;  . JOINT REPLACEMENT     knee  . JOINT REPLACEMENT Left Oct. 17, 2014   Elbow ( pt fell 08-31-13 )  . ORIF SHOULDER FRACTURE Right    "it was crushed"  . PERIPHERAL VASCULAR CATHETERIZATION N/A 10/30/2015   Procedure: Abdominal Aortogram w/Lower Extremity;  Surgeon: Serafina Mitchell, MD;  Location: [place] CV LAB;  Service: Cardiovascular;  Laterality: N/A;  . PERIPHERAL VASCULAR CATHETERIZATION  10/30/2015   Procedure: Peripheral Vascular Intervention;  Surgeon: Serafina Mitchell, MD;  Location: [place] CV LAB;  Service: Cardiovascular;;  . PERIPHERAL VASCULAR CATHETERIZATION N/A 04/01/2016   Procedure: Abdominal Aortogram w/Lower Extremity;  Surgeon: Serafina Mitchell, MD;  Location: [place] CV LAB;  Service: Cardiovascular;  Laterality: N/A;  . PERIPHERAL VASCULAR CATHETERIZATION Left 04/01/2016   Procedure: Peripheral Vascular Atherectomy;  Surgeon: Serafina Mitchell, MD;  Location: [place] CV LAB;  Service: Cardiovascular;  Laterality: Left;  Superficial femoral artery.  Marland Kitchen PERIPHERAL VASCULAR CATHETERIZATION Right 09/05/2016   "stent"  . PERIPHERAL VASCULAR CATHETERIZATION N/A 09/05/2016   Procedure: Abdominal Aortogram w/Lower Extremity;  Surgeon: Serafina Mitchell, MD;  Location: [place] CV LAB;  Service: Cardiovascular;  Laterality: N/A;  . PERIPHERAL VASCULAR CATHETERIZATION Right 09/05/2016   Procedure: Peripheral Vascular Intervention;  Surgeon: Serafina Mitchell, MD;  Location: [place] CV LAB;  Service: Cardiovascular;  Laterality: Right;  Superficial Femoral  . PERIPHERAL VASCULAR CATHETERIZATION Left 09/09/2016   Procedure: Lower Extremity Angiography;  Surgeon: Serafina Mitchell, MD;  Location: [place] CV LAB;   Service: Cardiovascular;  Laterality: Left;  . PR VEIN BYPASS GRAFT,AORTO-FEM-POP  09-13-09   Left Fem-pop  . THROMBECTOMY FEMORAL ARTERY Right 09/26/2016   Procedure: Thromboembolectomy Right Lower Extremity, Right Femoral Artery Endarterectomy with Patch Angioplasty; Right Lower Extremity Angiogram ;  Surgeon: Waynetta Sandy, MD;  Location: [place];  Service: Vascular;  Laterality: Right;  . TOTAL ELBOW ARTHROPLASTY Left 09/03/2013   Procedure: LEFT TOTAL ELBOW ARTHROPLASTY;  Surgeon: Roseanne Kaufman, MD;  Location: [place];  Service: Orthopedics;  Laterality: Left;  . TOTAL KNEE ARTHROPLASTY Left 06/2006  . TRANSMETATARSAL AMPUTATION Right 12/11/2016   Procedure: TRANSMETATARSAL AMPUTATION;  Surgeon: Serafina Mitchell, MD;  Location: [place];  Service: Vascular;  Laterality: Right;  . TUBAL LIGATION    . VAGINAL HYSTERECTOMY      There were no vitals filed for this visit.       Subjective Assessment - 09/09/17 1512    Subjective This 83yo female was referred by [name], NP on 08/04/17. She underwent a right Transtibial Amputation  on 12/23/16 by Dr. [name] due to failed fem-pop bypass done 09/24/16. She had Left Femoral-Tibial Bypass on 01/16/2017 with left foot drop after surgery. Patient recieved right prosthesis & left AFO on 09/02/17. She presents to PT for evaluation in w/c without prosthesis or AFO donned.     Patient is accompained by: Family member   Pertinent History Right TTA 12/23/16, left foot drop, Left bypass graft Oct 2010 & 2018, arthritis, A-fib, DVT, fall from stetps fx pelvis, left hip & left elbow, fx right shoulder, fibromyalgia, HTN, macular degeneration blind right eye, osteoporosis, PVD, CVA/TIA,    Limitations Lifting;Standing;Walking;House hold activities   Patient Stated Goals To use prosthesis to get around her house and get into car to go places like church.    Currently in Pain? Yes   Pain Score 3   In last week, worst 8-9/10, best 1-2/10   Pain Location  Knee   Pain Orientation Right;Left   Pain Descriptors / Indicators Nagging;Sore;Numbness   Pain Type Chronic pain   Pain Onset More than a month ago   Pain Frequency Constant   Aggravating Factors  arthritis, wearing new AFO, hang down   Pain Relieving Factors medication or elevating            [facility] PT Assessment - 09/09/17 1515      Assessment   Medical Diagnosis Right Transtibial Amputation   Referring Provider Alger Simons, MD  (dtr requested use of Dr. Naaman Plummer for referral)  [name], NP & [name], MD   Onset Date/Surgical Date 09/02/17  prosthesis & AFO delivery    Hand Dominance Right   Prior Therapy Rehab inpatient     Precautions   Precautions Fall;Other (comment)  No lifting >5# with LUE     Balance Screen   Has the patient fallen in the past 6 months No   Has the patient had a decrease in activity level because of a fear of falling?  No   Is the patient reluctant to leave their home because of a fear of falling?  No     Home Environment   Living Environment Private residence   Living Arrangements Children  adult daughter & son-law   Type of [place] entrance   [place] One level  one step laundry room/ [place] - 4 wheels;Walker - 2 wheels;Cane - single point;Cane - quad;Bedside commode;Tub bench;Grab bars - tub/shower;Wheelchair - manual     Prior Function   Level of Independence Independent with household mobility with device;Independent with community mobility with device  limited community with walker   Vocation Retired     Observation/Other Assessments   Focus on Therapeutic Outcomes (FOTO)  0.95% Functional Status   Activities of Balance Confidence Scale (ABC Scale)  0.0%     Posture/Postural Control   Posture/Postural Control Postural limitations   Postural Limitations Rounded Shoulders;Forward head;Increased thoracic kyphosis;Flexed trunk     ROM / Strength   AROM / PROM / Strength  AROM;Strength     AROM   Overall AROM  Deficits   Overall AROM Comments RUE shoulder <50*, LUE shoulder 80*,    AROM Assessment Site Hip;Knee;Ankle   Right/Left Hip Right;Left   Right Hip Extension -30  standing   Left Hip Extension -30  standing   Right/Left Knee Right;Left   Right Knee Extension -20   Left Knee Extension -25   Right/Left Ankle Left   Left Ankle Dorsiflexion -35  Left Ankle Eversion -30     Strength   Overall Strength Deficits   Overall Strength Comments RUE grossly 2/5, LUE 3-/5   Right/Left Hip Right;Left   Right Hip Flexion 3-/5   Right Hip Extension 2/5   Right Hip ABduction 2/5   Right Hip ADduction 2/5   Left Hip Flexion 3-/5   Left Hip Extension 2/5   Left Hip ABduction 2/5   Left Hip ADduction 2/5   Right/Left Knee Right;Left   Right Knee Flexion 3-/5   Right Knee Extension 3-/5   Left Knee Flexion 3-/5   Left Knee Extension 3-/5   Right/Left Ankle Left   Left Ankle Dorsiflexion 2-/5   Left Ankle Plantar Flexion 2-/5   Left Ankle Inversion 2-/5   Left Ankle Eversion 2-/5     Transfers   Transfers Sit to Stand;Stand to Sit   Sit to Stand 3: Mod assist;With upper extremity assist;With armrests;From chair/3-in-1  to RW   Stand to Sit 4: Min assist;With upper extremity assist;With armrests;To chair/3-in-1  from RW     Ambulation/Gait   Ambulation/Gait Yes   Ambulation/Gait Assistance 2: Max assist   Ambulation/Gait Assistance Details excessive UE weight bearing   Ambulation Distance (Feet) 10 Feet   Assistive device Rolling walker;Prosthesis;Other (Comment)  Left AFO   Gait Pattern Step-to pattern;Decreased step length - left;Decreased stance time - left;Decreased stride length;Decreased hip/knee flexion - left;Decreased dorsiflexion - left;Decreased weight shift to left;Left hip hike;Left steppage;Right flexed knee in stance;Left flexed knee in stance;Antalgic;Lateral trunk lean to right;Trunk flexed;Poor foot clearance - left  Left foot  plantarflexed in stance   Ambulation Surface Indoor;Level     Balance   Balance Assessed Yes     Static Standing Balance   Static Standing - Balance Support Bilateral upper extremity supported   Static Standing - Level of Assistance 4: Min assist   Static Standing - Comment/# of Minutes stands 2 minutes     Dynamic Standing Balance   Dynamic Standing - Balance Support Bilateral upper extremity supported;Right upper extremity supported   Dynamic Standing - Level of Assistance 3: Mod assist   Dynamic Standing - Comments reaches with RUE 2" with LUE support on RW with modA; turns head only, no thoracic or lumbar motion to scan environment with BUE support with modA.           Prosthetics Assessment - 09/10/17 1515      Prosthetics   Prosthetic Care Dependent with --   Donning prosthesis  --   Doffing prosthesis  --   Current prosthetic wear tolerance (days/week)  --   Current prosthetic wear tolerance (#hours/day)  --   Current prosthetic weight-bearing tolerance (hours/day)  --   Edema --   Residual limb condition  --   K code/activity level with prosthetic use  --           Objective measurements completed on examination: See above findings.          [facility] Adult PT Treatment/Exercise - 09/09/17 1515      Prosthetics   Prosthetic Care Comments  Unable to donne left shoe with sock. PT recommended use of light compression hose on Left lower extremity so AFO not in direct contact with her skin.    Education Provided Skin check;Residual limb care;Prosthetic cleaning;Care of non-amputated limb;Correct ply sock adjustment;Proper Donning;Proper Doffing;Proper wear schedule/adjustment  initiate wear 2hrs 2x/day sitting with BLEs supported   Person(s) Educated Patient;Child(ren)   Education Method Explanation;Demonstration;Tactile cues;Verbal cues  Education Method Verbalized understanding;Returned demonstration;Tactile cues required;Verbal cues required;Needs further  instruction                  PT Short Term Goals - 09/09/17 1655      PT SHORT TERM GOAL #1   Title Patient & dtr demonstrate proper donning of prosthesis & AFO. (All STGs Target Date 10/07/2017)   Time 4   Period Weeks   Status New   Target Date 10/07/17     PT SHORT TERM GOAL #2   Title Patient tolerates wear of prosthesis & AFO >5 hrs total/day with skin issues.    Time 4   Period Weeks   Status New   Target Date 10/07/17     PT SHORT TERM GOAL #3   Title Patient sit to/from stand w/c to RW with minA.   Time 4   Period Weeks   Status New   Target Date 10/07/17     PT SHORT TERM GOAL #4   Title Patient able to stand with RW support with prosthesis & RW for 2 minutes with supervision.    Time 4   Period Weeks   Status New   Target Date 10/07/17     PT SHORT TERM GOAL #5   Title Patient ambulates 74' with RW, prosthesis & AFO with modA.   Time 4   Period Weeks   Status New   Target Date 10/07/17           PT Long Term Goals - 09/09/17 1650      PT LONG TERM GOAL #1   Title Patient's daughter & patient verbalize proper prosthetic care. (All LTGs Target Date 11/06/2017)   Time 8   Period Weeks   Status New   Target Date 11/06/17     PT LONG TERM GOAL #2   Title Patient tolerates wear of TTA prosthesis & AFO >50% of awake hours without skin issues.    Time 8   Period Weeks   Status New   Target Date 11/06/17     PT LONG TERM GOAL #3   Title Patient performs sit to/from stand transfers chairs with armrests to RW with supervision.    Time 8   Period Weeks   Status New   Target Date 11/06/17     PT LONG TERM GOAL #4   Title Patient standing balance with prosthesis, AFO & RW reaching 5" anterior to RW, scanning environment with head turns and managing pants for toileting with minA.    Time 8   Period Weeks   Status New   Target Date 11/06/17     PT LONG TERM GOAL #5   Title Patient ambulates 65' with RW, prosthesis & AFO with minA.    Time 8   Period Weeks   Status New   Target Date 11/06/17                Plan - 09/09/17 1628    Clinical Impression Statement This 83yo female was independent with RW or cane at basic community level one year ago. She has medical issues with bilateral lower extremities with right Transtibial Amputation & Left foot drop. She has weakness & arthritic pain in all 4 extremities limiting mobility. Patient is dependent in all prosthetic care and her daughter is unknowledgeable in proper use. Patient has not worn prosthesis or AFO outside of prosthetist office. Patient is dependent is sit to/from stand and standing balance. Patient's gait is dependent for  limited distance of 20'. Patient will benefit from skilled PT to progress mobility.    History and Personal Factors relevant to plan of care: Patient lived alone & her daughter/son-law moved into her home for care. They both work and pay a caregiver during work day. Right TTA 12/23/16, left foot drop, L bypass graft Oct 2010, arthritis, A-fib, DVT, Blood clot LLE foot drop, R TTA, fall from stetps fx pelvis, left hip & left elbow, fibromyalgia, HTN, macular degeneration blind right eye, osteoporosis, PVD, CVA, TIA, right elbow replacement, chronic LBP,    Clinical Presentation Unstable   Clinical Presentation due to: dependency, involvement of 4 extremities, pain, weakness, high fall risk, frail skin & health   Clinical Decision Making High   Rehab Potential Good   Clinical Impairments Affecting Rehab Potential Right TTA 12/23/16, left foot drop, L bypass graft Oct 2010, arthritis, A-fib, DVT, Blood clot LLE foot drop, R TTA, fall from stetps fx pelvis, left hip & left elbow, fibromyalgia, HTN, macular degeneration blind right eye, osteoporosis, PVD, CVA, TIA, right elbow replacement, chronic LBP,    PT Frequency 2x / week   PT Duration 8 weeks   PT Treatment/Interventions ADLs/Self Care Home Management;Moist Heat;DME Instruction;Gait training;Stair  training;Functional mobility training;Therapeutic activities;Therapeutic exercise;Balance training;Neuromuscular re-education;Patient/family education;Orthotic Fit/Training;Prosthetic Training;Vestibular   PT Next Visit Plan review prosthetic & orthotic care, initiate HEP, sit to/from stand & balance activities   Recommended Other Services Use of low compression hose LLE   Consulted and Agree with Plan of Care Patient;Family member/caregiver   Family Member Consulted dtr      Patient will benefit from skilled therapeutic intervention in order to improve the following deficits and impairments:  Abnormal gait, Cardiopulmonary status limiting activity, Decreased activity tolerance, Decreased balance, Decreased endurance, Decreased knowledge of use of DME, Decreased range of motion, Decreased mobility, Decreased skin integrity, Decreased strength, Difficulty walking, Increased edema, Impaired flexibility, Postural dysfunction, Pain, Prosthetic Dependency, Dizziness  Visit Diagnosis: Pain in left leg  Pain in right leg  Muscle weakness (generalized)  Contracture of muscle, multiple sites  Unsteadiness on feet  Other abnormalities of gait and mobility  Decreased independence with transfers  Dizziness and giddiness      G-Codes - Sep 24, 2017 1645    Functional Assessment Tool Used (Outpatient Only) Patient & dtr are dependent in all prosthetic & orthotic care. Patient requires maxA to donne/doffe prosthesis & AFO. Patient has not worn prosthesis or AFO since delivery 7 days prior to PT eval.    Functional Limitation Self care   Self Care Current Status [phone]) 100 percent impaired, limited or restricted   Self Care Goal Status (N0272) At least 20 percent but less than 40 percent impaired, limited or restricted       Problem List Patient Active Problem List   Diagnosis Date Noted  . Plantar fasciitis of left foot 06/10/2017  . Idiopathic chronic venous hypertension of both lower  extremities with inflammation 05/21/2017  . Foot drop, left 05/21/2017  . Decubitus ulcer of sacral region, unstageable ([place])   . Deep tissue injury   . Hypokalemia   . Hypoalbuminemia due to protein-calorie malnutrition ([place])   . Debilitated 01/21/2017  . Neuropathic pain   . Post-operative pain   . Slow transit constipation   . Debility   . Ischemic leg   . PAF (paroxysmal atrial fibrillation) ([place])   . Anemia of chronic disease   . Chronic pain syndrome   . Fibromyalgia   . Stage 3 chronic kidney  disease ([place])   . Benign essential HTN   . Abnormal urinalysis   . PVD (peripheral vascular disease) ([place]) 01/13/2017  . Post-op pain 01/03/2017  . Phantom limb pain ([place]) 01/03/2017  . S/P unilateral BKA (below knee amputation), right ([place]) 12/30/2016  . Acute on chronic combined systolic and diastolic CHF (congestive heart failure) ([place]) 12/22/2016  . Anemia 12/22/2016  . Palliative care encounter   . Lower extremity pain, right   . Acute GI bleeding 10/23/2016  . Paroxysmal atrial fibrillation ([place]) 10/03/2016  . Atherosclerosis of autologous vein bypass graft of left lower extremity with rest pain ([place]) 09/24/2016  . PAD (peripheral artery disease) ([place]) 09/05/2016  . Groin pain 04/02/2015  . Aftercare following surgery of the circulatory system, [place] 12/13/2013  . Shingles 10/26/2013  . Anxiety 10/26/2013  . Acute blood loss anemia 09/05/2013  . Elbow fracture, left 09/05/2013  . Left acetabular fracture ([place]) 09/05/2013  . Ankle fracture, left 09/05/2013  . HTN (hypertension) 09/03/2013  . HLD (hyperlipidemia) 09/03/2013  . Peripheral vascular disease, unspecified ([place]) 05/10/2012  . Chronic total occlusion of artery of the extremities ([place]) 01/19/2012    Roxan Yamamoto  PT, DPT 09/10/2017, 5:02 PM  [facility] [address] [place], [place], [address] Phone: [phone]   Fax:  [phone]  Name:  [name] MRN: [id number] Date of Birth: [birth year]

## 2017-09-12 IMAGING — CT CT ANGIO AOBIFEM WO/W CM
1 of 6 series · 6 of 16 positions shown, 8 images · IV contrast (OMNI 350)
Comparison: CT scan left hip 09/01/2013

CLINICAL DATA: 88-year-old female with left lower extremity pain
and peripheral arterial disease

EXAM:
CT ANGIOGRAPHY OF ABDOMINAL AORTA WITH ILIOFEMORAL RUNOFF
TECHNIQUE: Multidetector CT imaging of the abdomen, pelvis and lower
extremities was performed using the standard protocol during bolus
administration of intravenous contrast. Multiplanar CT image
reconstructions and MIPs were obtained to evaluate the vascular
anatomy.
CONTRAST:  76 mL Isovue 370

[Series 5: cta runoff (id) · axial · 0.91mm/px · z∈[-1284,-364]mm · 6 of 431 slices shown, 8 images]
[im 62/431  soft-tissue]
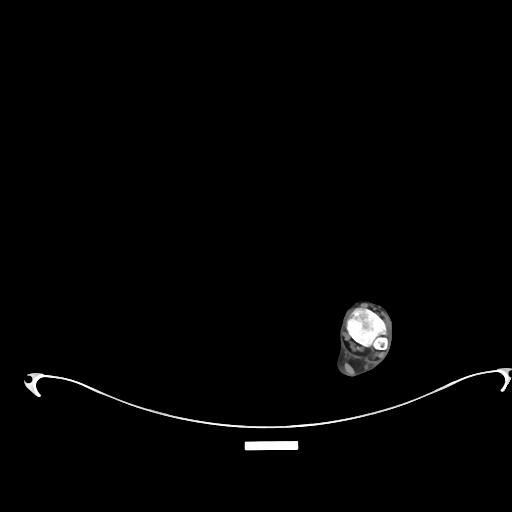
[im 62/431  bone]
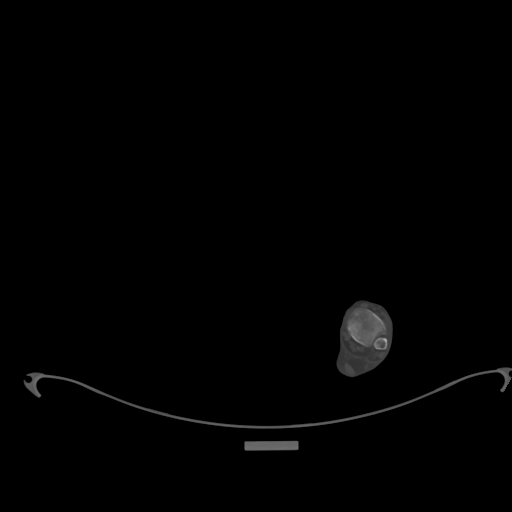
[im 123/431  soft-tissue]
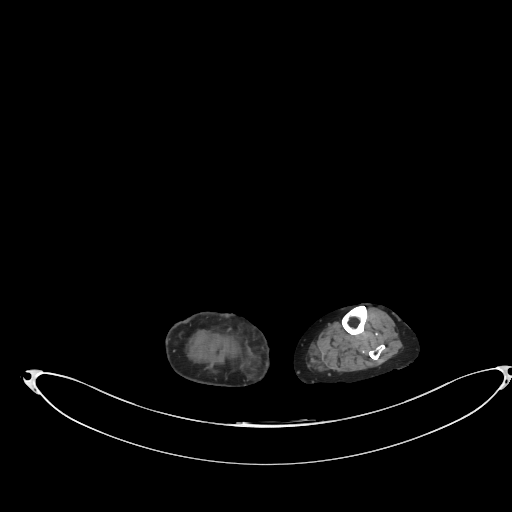
[im 185/431  soft-tissue]
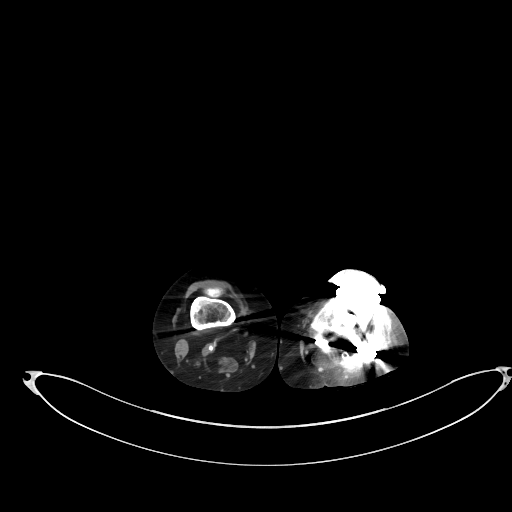
[im 246/431  soft-tissue]
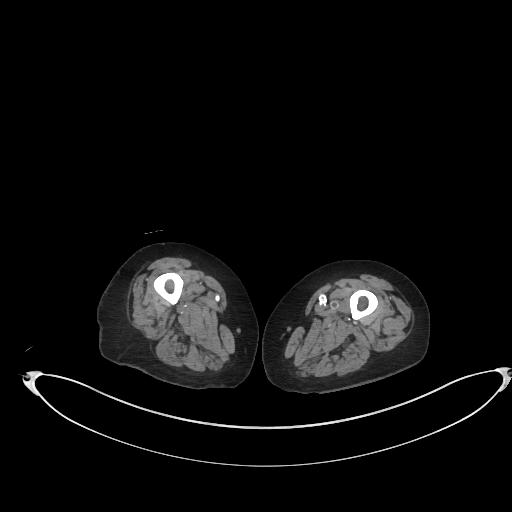
[im 308/431  soft-tissue]
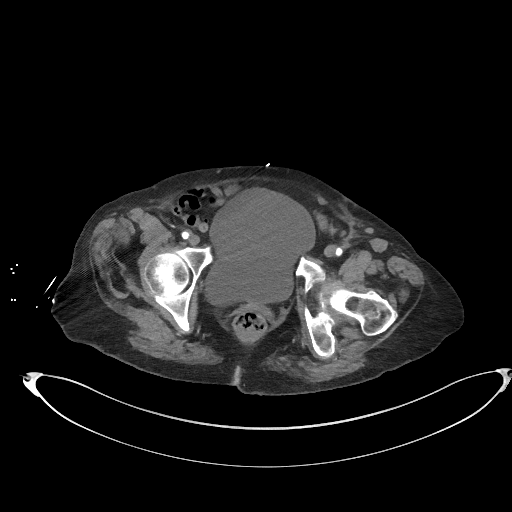
[im 308/431  bone]
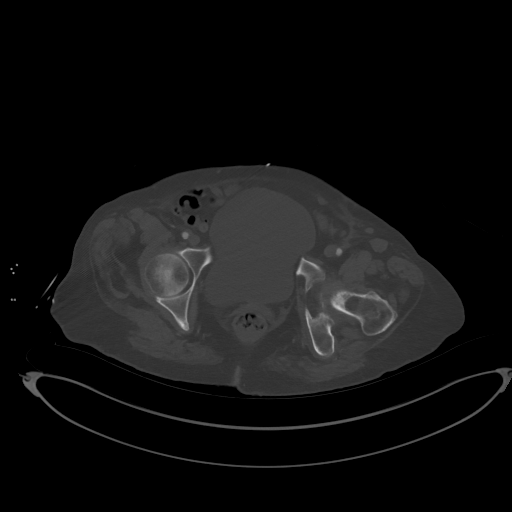
[im 369/431  soft-tissue]
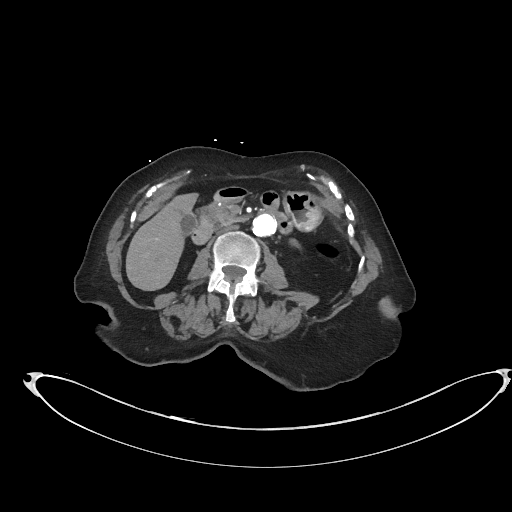

[6 of 16 positions shown; findings below may reference images not displayed]

FINDINGS: VASCULAR

Aorta: Heterogeneous atherosclerotic plaque without evidence of
aneurysm or dissection. There is a focal fusiform ectasia of the
infrarenal aorta measuring up to 2.3 cm in diameter.

Celiac: Widely patent with conventional hepatic arterial anatomy.

SMA: Fibrofatty plaque results in moderate (40- 50%) stenosis of the
proximal superior mesenteric artery. No evidence of distal
occlusion.

Renals: Mixed calcified and fibrofatty plaque at the origin of the
right renal artery results in at least moderate stenosis. High-grade
stenosis versus short segment occlusion of the left renal artery.

IMA: Patent.

RIGHT Lower Extremity

Inflow: Scattered calcified and noncalcified fibrofatty plaque.
There is mild stenosis of the distal common iliac artery just
proximal to the bifurcation. The internal iliac artery remains
patent. The external iliac artery is diseased. There is a region of
mild stenosis distally.

Outflow: The common femoral artery is widely patent. There is a
healed incision overlying the artery suggesting prior
endarterectomy. The profunda femoral artery is widely patent. Patent
overlapping stents system throughout the superficial femoral artery.
There is a high-grade edge stenosis of the native superficial
femoral artery at the distal aspect of the distal most stent in the
mid to lower SFA. The more distal superficial femoral artery is
diffusely diseased with areas of moderate to high-grade stenosis in
the adductor canal. Similarly, the popliteal artery is diffusely
diseased with a focal high-grade stenosis in the above the knee
segment.

Runoff: Completely occluded runoff vessels. Surgical changes of
prior below-the-knee amputation.

LEFT Lower Extremity

Inflow: Diffusely diseased iliac vessels. Perhaps mild narrowing of
the distal common iliac artery. The internal iliac artery remains
patent. Patent stent in the external iliac artery. No focal
high-grade stenosis.

Outflow: Surgical changes of prior common femoral endarterectomy.
Occluded femoral popliteal bypass graft. Additionally, there is an
occluded complex stent system extending from the SFA origin into the
below the knee popliteal artery. Some of the stents are luminal,
while some are sub intimal. All stents are occluded.

Runoff: The anterior tibial artery is occluded proximally but
reconstitutes via collateral flow. The vessel is not visualized
beyond the distal lower leg likely secondary to slow contrast
opacification. The tibioperoneal trunk and posterior tibial arteries
are occluded. The peroneal artery opacifies a segmental fashion
consistent with intermittent occlusion.

Veins: No obvious venous abnormality within the limitations of this
arterial phase study.

Review of the MIP images confirms the above findings.

NON-VASCULAR

Lower chest: Mild dependent atelectasis in the lower lobes. No
suspicious pulmonary nodule or mass. Borderline cardiomegaly. The
left atrium is enlarged. No pericardial effusion. Unremarkable
visualized thoracic esophagus.

Hepatobiliary: 1.4 cm hepatic cyst in segment 2. 1.2 cm probable
cyst in hepatic segment 3. No suspicious or solid hepatic lesion
identified. Normal morphology. Gallbladder is unremarkable. No intra
or extrahepatic biliary ductal dilatation.

Pancreas: Unremarkable. No pancreatic ductal dilatation or
surrounding inflammatory changes.

Spleen: Normal in size without focal abnormality.

Adrenals/Urinary Tract: Normal adrenal glands. Left renal atrophy
and hypoperfusion likely secondary to occlusion versus high-grade
stenosis of the left renal artery. The right kidney remains well
perfused. No focal cortical scarring, hydronephrosis,
nephrolithiasis or enhancing renal mass. The bladder is distended
with urine.

Stomach/Bowel: No focal bowel wall thickening or evidence of
obstruction. No significant colonic diverticulosis.

Lymphatic: No suspicious lymphadenopathy.

Reproductive: Surgical changes of prior hysterectomy. The right
adnexa is within normal limits. There is a multiloculated cystic
lesion affiliated with the left ovary measuring 3.5 x 2.9 cm.
Correlation with the prior CT scan of the pelvis from 08/22/2013
demonstrates perhaps a 1.9 cm cystic lesion at that time. This
represents an interval change.

Other: No abdominal wall hernia or abnormality. No abdominopelvic
ascites.

Musculoskeletal: No acute or significant osseous findings. Healed
left acetabular fracture with secondary degenerative osteoarthritis.
Left total knee arthroplasty. Right below-the-knee amputation.
IMPRESSION: VASCULAR

1. Occluded left lower extremity stent system extending from the
origin of the superficial femoral artery into the below the knee
popliteal artery. The anterior tibial artery remains patent
proximally secondary to collateral flow and is visualized into the
lower leg before the contrast density is too faint to evaluate
patency. The posterior tibial artery is chronically occluded. The
peroneal artery is segmentally occluded.
2. Chronically occluded left femoral to above the knee bypass graft.
3. Widely patent left external iliac artery stent.
4. Patent right superficial femoral artery stent system although
there is a focal high-grade edge stenosis at the distal margin of
the stent in the mid to lower thigh.
5. Diffusely diseased distal right superficial femoral and popliteal
arteries with multifocal regions of a mild to moderate stenosis.
6. Chronic occlusion of the left renal artery.
7. Mild to moderate stenosis of the origin of the right renal
artery.
8. Moderate stenosis of the proximal superior mesenteric artery.

NON-VASCULAR

1. New versus enlarging left ovarian cystic lesion. Differential
considerations include both benign and malignant cystic ovarian
neoplasms. Consider referral to gynecological oncology for further
evaluation and management. If additional imaging is clinically
warranted a transvaginal ultrasound could be considered.
2. Cardiomegaly with left atrial enlargement.
3. Chronic left renal atrophy secondary to left renal artery
occlusion.
4. Healed left acetabular fracture with secondary degenerative
osteoarthritis of the hip joint.
5. Right below-the-knee amputation.
6. Additional ancillary findings as above.

These results will be called to the ordering clinician or
representative by the Radiologist Assistant, and communication
documented in the PACS or zVision Dashboard.

## 2017-09-16 ENCOUNTER — Ambulatory Visit: Payer: Medicare Other | Admitting: Physical Therapy

## 2017-09-16 ENCOUNTER — Encounter: Payer: Self-pay | Admitting: Physical Therapy

## 2017-09-16 DIAGNOSIS — M79604 Pain in right leg: Secondary | ICD-10-CM | POA: Diagnosis not present

## 2017-09-16 DIAGNOSIS — M6281 Muscle weakness (generalized): Secondary | ICD-10-CM

## 2017-09-16 DIAGNOSIS — R2681 Unsteadiness on feet: Secondary | ICD-10-CM | POA: Diagnosis not present

## 2017-09-16 DIAGNOSIS — M6249 Contracture of muscle, multiple sites: Secondary | ICD-10-CM | POA: Diagnosis not present

## 2017-09-16 DIAGNOSIS — R2689 Other abnormalities of gait and mobility: Secondary | ICD-10-CM | POA: Diagnosis not present

## 2017-09-16 DIAGNOSIS — M79605 Pain in left leg: Secondary | ICD-10-CM | POA: Diagnosis not present

## 2017-09-17 NOTE — Therapy (Signed)
[facility] [address] [place], [place], [address] Phone: [phone]   Fax:  [phone]  Physical Therapy Treatment  Patient Details  Name: [name] MRN: [id number] Date of Birth: Nov 16, 1928 Referring Provider: Alger Simons, MD  (dtr requested use of Dr. Naaman Plummer for referral) [name], NP & [name], MD)  Encounter Date: 09/16/2017      PT End of Session - 09/16/17 2010    Visit Number 2   Number of Visits 17   Date for PT Re-Evaluation 11/06/17   Authorization Type Medicare G-code & progress note every 10th visit   PT Start Time 1405   PT Stop Time 1445   PT Time Calculation (min) 40 min   Equipment Utilized During Treatment Gait belt   Activity Tolerance Patient limited by pain;Patient limited by fatigue   Behavior During Therapy [facility] for tasks assessed/performed      Past Medical History:  Diagnosis Date  . Anemia   . Anginal pain ([place])   . Arthritis    "qwhere" (09/05/2016)  . Atrial fibrillation ([place]) 09/2016  . Chronic lower back pain   . Complication of anesthesia    "takes a long time for it to wear off; I can hallucinate if I take too much" (09/05/2016)  . DVT (deep venous thrombosis) ([place]) 10/2009  . Fall from steps 08/31/2013   Fx. pelvis, Left Hip, Left Elbow  . Fibromyalgia   . GERD (gastroesophageal reflux disease)    09/22/16- "no too much anymore"  . GI bleed 10/24/2016  . High cholesterol   . History of blood transfusion   . History of hiatal hernia   . Hypertension   . 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 Osteoporosis   . Peripheral vascular disease ([place])    nonviable tissue Right foot  . PONV (postoperative nausea and vomiting)   . Squamous cell carcinoma of skin of right calf Aug. 2015  . Stroke ([place])    TIA's no residual  . TIA (transient  ischemic attack)    "several at once; none in a long time" (09/05/2016)    Past Surgical History:  Procedure Laterality Date  . ABDOMINAL AORTAGRAM N/A 12/26/2014   Procedure: ABDOMINAL Maxcine Ham;  Surgeon: Serafina Mitchell, MD;  Location: [facility] CATH LAB;  Service: Cardiovascular;  Laterality: N/A;  . AMPUTATION Right 12/23/2016   Procedure: AMPUTATION BELOW KNEE;  Surgeon: Serafina Mitchell, MD;  Location: [place];  Service: Vascular;  Laterality: Right;  . AORTOGRAM N/A 09/26/2016   Procedure: AORTOGRAM;  Surgeon: Waynetta Sandy, MD;  Location: [place];  Service: Vascular;  Laterality: N/A;  . CARPAL TUNNEL RELEASE Right   . CATARACT EXTRACTION W/ INTRAOCULAR LENS  IMPLANT, BILATERAL Bilateral   . COLONOSCOPY    . DILATION AND CURETTAGE OF UTERUS    . DRESSING CHANGE UNDER ANESTHESIA Right 01/16/2017   Procedure: DRESSING CHANGE RIGHT BELOW KNEE AMPUTATION;  Surgeon: Serafina Mitchell, MD;  Location: [place];  Service: Vascular;  Laterality: Right;  . EYE SURGERY Right    "macular OR"  . FEMORAL ARTERY STENT  12-11-10   Left SFA  . FEMORAL-POPLITEAL BYPASS GRAFT Left 09/24/2016   Procedure: REDO FEMORAL TO POPLITEAL ARTERY BYPASS GRAFT USING 6MM PROPATEN RINGED GORTEX GRAFT;  Surgeon: Serafina Mitchell, MD;  Location: [place];  Service: Vascular;  Laterality: Left;  . FEMORAL-TIBIAL BYPASS GRAFT Left 01/16/2017   Procedure: REDO BYPASS GRAFT FEMORAL-TIBIAL ARTERY WITH GORTEX GRAFT;  Surgeon: Serafina Mitchell, MD;  Location: [place];  Service: Vascular;  Laterality: Left;  AND LOWER LEG   . I&D EXTREMITY Right 12/17/2016   Procedure: IRRIGATION AND DEBRIDEMENT RIGHT FOOT;  Surgeon: Serafina Mitchell, MD;  Location: [place];  Service: Vascular;  Laterality: Right;  . INCISION AND DRAINAGE OF WOUND Left 10/25/2009   leg/notes 11/13/2009  . INSERTION OF ILIAC STENT Left 12/26/2014   Procedure: INSERTION OF ILIAC STENT;  Surgeon: Serafina Mitchell, MD;  Location: [facility] CATH LAB;  Service: Cardiovascular;  Laterality:  Left;  . INSERTION OF ILIAC STENT Left 09/26/2016   Procedure: SUB INTIMAL INSERTION OF SUPERFICIAL FEMORAL ARTERY AND BELOW KNEE BYPASS GRAFT;  Surgeon: Waynetta Sandy, MD;  Location: [place];  Service: Vascular;  Laterality: Left;  . JOINT REPLACEMENT     knee  . JOINT REPLACEMENT Left Oct. 17, 2014   Elbow ( pt fell 08-31-13 )  . ORIF SHOULDER FRACTURE Right    "it was crushed"  . PERIPHERAL VASCULAR CATHETERIZATION N/A 10/30/2015   Procedure: Abdominal Aortogram w/Lower Extremity;  Surgeon: Serafina Mitchell, MD;  Location: [place] CV LAB;  Service: Cardiovascular;  Laterality: N/A;  . PERIPHERAL VASCULAR CATHETERIZATION  10/30/2015   Procedure: Peripheral Vascular Intervention;  Surgeon: Serafina Mitchell, MD;  Location: [place] CV LAB;  Service: Cardiovascular;;  . PERIPHERAL VASCULAR CATHETERIZATION N/A 04/01/2016   Procedure: Abdominal Aortogram w/Lower Extremity;  Surgeon: Serafina Mitchell, MD;  Location: [place] CV LAB;  Service: Cardiovascular;  Laterality: N/A;  . PERIPHERAL VASCULAR CATHETERIZATION Left 04/01/2016   Procedure: Peripheral Vascular Atherectomy;  Surgeon: Serafina Mitchell, MD;  Location: [place] CV LAB;  Service: Cardiovascular;  Laterality: Left;  Superficial femoral artery.  Marland Kitchen PERIPHERAL VASCULAR CATHETERIZATION Right 09/05/2016   "stent"  . PERIPHERAL VASCULAR CATHETERIZATION N/A 09/05/2016   Procedure: Abdominal Aortogram w/Lower Extremity;  Surgeon: Serafina Mitchell, MD;  Location: [place] CV LAB;  Service: Cardiovascular;  Laterality: N/A;  . PERIPHERAL VASCULAR CATHETERIZATION Right 09/05/2016   Procedure: Peripheral Vascular Intervention;  Surgeon: Serafina Mitchell, MD;  Location: [place] CV LAB;  Service: Cardiovascular;  Laterality: Right;  Superficial Femoral  . PERIPHERAL VASCULAR CATHETERIZATION Left 09/09/2016   Procedure: Lower Extremity Angiography;  Surgeon: Serafina Mitchell, MD;  Location: [place] CV LAB;  Service:  Cardiovascular;  Laterality: Left;  . PR VEIN BYPASS GRAFT,AORTO-FEM-POP  09-13-09   Left Fem-pop  . THROMBECTOMY FEMORAL ARTERY Right 09/26/2016   Procedure: Thromboembolectomy Right Lower Extremity, Right Femoral Artery Endarterectomy with Patch Angioplasty; Right Lower Extremity Angiogram ;  Surgeon: Waynetta Sandy, MD;  Location: [place];  Service: Vascular;  Laterality: Right;  . TOTAL ELBOW ARTHROPLASTY Left 09/03/2013   Procedure: LEFT TOTAL ELBOW ARTHROPLASTY;  Surgeon: Roseanne Kaufman, MD;  Location: [place];  Service: Orthopedics;  Laterality: Left;  . TOTAL KNEE ARTHROPLASTY Left 06/2006  . TRANSMETATARSAL AMPUTATION Right 12/11/2016   Procedure: TRANSMETATARSAL AMPUTATION;  Surgeon: Serafina Mitchell, MD;  Location: [place];  Service: Vascular;  Laterality: Right;  . TUBAL LIGATION    . VAGINAL HYSTERECTOMY      There were no vitals filed for this visit.      Subjective Assessment - 09/16/17 1406    Subjective She reports wears prosthesis & AFO 2 hrs 2x/day most days, only 1x some days.  Patient is accompained by: Family member   Pertinent History Right TTA 12/23/16, left foot drop, Left bypass graft Oct 2010 & 2018, arthritis, A-fib, DVT, fall from stetps fx pelvis, left hip & left elbow, fx right shoulder, fibromyalgia, HTN, macular degeneration blind right eye, osteoporosis, PVD, CVA/TIA,    Limitations Lifting;Standing;Walking;House hold activities   Patient Stated Goals To use prosthesis to get around her house and get into car to go places like church.    Currently in Pain? Yes   Pain Score 3    Pain Location Knee   Pain Orientation Right;Left   Pain Descriptors / Indicators Aching;Sore;Numbness   Pain Type Chronic pain   Pain Onset More than a month ago   Aggravating Factors  arthritis, wearing new AFO, hanging down   Pain Relieving Factors medication or elevating                         [facility] Adult PT Treatment/Exercise - 09/16/17 1405       Transfers   Transfers Sit to Stand;Stand to Sit   Sit to Stand 4: Min assist;With upper extremity assist;With armrests;From chair/3-in-1  to sink   Sit to Stand Details Tactile cues for posture;Tactile cues for sequencing;Verbal cues for technique;Verbal cues for sequencing   Sit to Stand Details (indicate cue type and reason) LUE on sink, RUE on armrests to stand   Stand to Sit 4: Min assist;4: Min guard;With upper extremity assist;With armrests;To chair/3-in-1  from sink   Stand to Sit Details (indicate cue type and reason) Tactile cues for weight shifting;Tactile cues for sequencing;Verbal cues for sequencing;Verbal cues for technique   Stand to Sit Details LUE on sink, RUE on armrests to stand     Neuro Re-ed    Neuro Re-ed Details  standing at sink 3 reps with tactile cues on posture: 1st 60sec, 2nd 90sec, 3rd 120sec.  FIrst 2 reps static stance only, 3rd rep moving cones with LUE across midline from one side of sink to other with minA.      Prosthetics   Prosthetic Care Comments  Donning AFO with sock on lower limb to protect skin, PT donned AFO with verbal cues on technique. PT instructed wife in aligning prosthetic pin & shuttle lock to be able to engage pin.  Positioning in recliner with LEs elevated, supported.  Alternate sitting in w/c & recliner with LEs elevated every 30 minutes while wearing prosthesis.  Increase wear to 3 hrs 2x/day.    Current prosthetic wear tolerance (days/week)  daily   Current prosthetic wear tolerance (#hours/day)  worn 2hrs 2x most days & other days 1x per day   Residual limb condition  Right lower extremity with frail skin, no hair growth, bruising, redness patella & proximal to knee;  Left lower extremity with frail skin, pail color, redness on little toe after standing.     Education Provided Skin check;Residual limb care;Proper Donning;Proper wear schedule/adjustment;Other (comment)  see prosthetic care comments   Person(s) Educated Patient;Child(ren)    Education Method Explanation;Demonstration;Tactile cues;Verbal cues   Education Method Verbalized understanding;Returned demonstration;Tactile cues required;Verbal cues required;Needs further instruction                  PT Short Term Goals - 09/16/17 2010      PT SHORT TERM GOAL #1   Title Patient & dtr demonstrate proper donning of prosthesis & AFO. (All STGs Target Date 10/07/2017)   Time 4   Period Weeks  Status On-going   Target Date 10/07/17     PT SHORT TERM GOAL #2   Title Patient tolerates wear of prosthesis & AFO >5 hrs total/day with skin issues.    Time 4   Period Weeks   Status On-going   Target Date 10/07/17     PT SHORT TERM GOAL #3   Title Patient sit to/from stand w/c to RW with minA.   Time 4   Period Weeks   Status On-going   Target Date 10/07/17     PT SHORT TERM GOAL #4   Title Patient able to stand with RW support with prosthesis & RW for 2 minutes with supervision.    Time 4   Period Weeks   Status On-going   Target Date 10/07/17     PT SHORT TERM GOAL #5   Title Patient ambulates 45' with RW, prosthesis & AFO with modA.   Time 4   Period Weeks   Status On-going   Target Date 10/07/17           PT Long Term Goals - 09/16/17 2011      PT LONG TERM GOAL #1   Title Patient's daughter & patient verbalize proper prosthetic care. (All LTGs Target Date 11/06/2017)   Time 8   Period Weeks   Status New   Target Date 11/06/17     PT LONG TERM GOAL #2   Title Patient tolerates wear of TTA prosthesis & AFO >50% of awake hours without skin issues.    Time 8   Period Weeks   Status On-going   Target Date 11/06/17     PT LONG TERM GOAL #3   Title Patient performs sit to/from stand transfers chairs with armrests to RW with supervision.    Time 8   Period Weeks   Status On-going   Target Date 11/06/17     PT LONG TERM GOAL #4   Title Patient standing balance with prosthesis, AFO & RW reaching 5" anterior to RW, scanning  environment with head turns and managing pants for toileting with minA.    Time 8   Period Weeks   Status On-going   Target Date 11/06/17     PT LONG TERM GOAL #5   Title Patient ambulates 75' with RW, prosthesis & AFO with minA.   Time 8   Period Weeks   Status On-going   Target Date 11/06/17               Plan - 09/16/17 2012    Clinical Impression Statement Patient tolerated AFO wear with sock on lower leg to protect skin. Standing at sink with increased stability over RW enabled standing balance.     Rehab Potential Good   Clinical Impairments Affecting Rehab Potential Right TTA 12/23/16, left foot drop, L bypass graft Oct 2010, arthritis, A-fib, DVT, Blood clot LLE foot drop, R TTA, fall from stetps fx pelvis, left hip & left elbow, fibromyalgia, HTN, macular degeneration blind right eye, osteoporosis, PVD, CVA, TIA, right elbow replacement, chronic LBP,    PT Frequency 2x / week   PT Duration 8 weeks   PT Treatment/Interventions ADLs/Self Care Home Management;Moist Heat;DME Instruction;Gait training;Stair training;Functional mobility training;Therapeutic activities;Therapeutic exercise;Balance training;Neuromuscular re-education;Patient/family education;Orthotic Fit/Training;Prosthetic Training;Vestibular   PT Next Visit Plan review prosthetic & orthotic care, initiate HEP, sit to/from stand & balance activities at sink   Consulted and Agree with Plan of Care Patient;Family member/caregiver   Family Member Consulted dtr  Patient will benefit from skilled therapeutic intervention in order to improve the following deficits and impairments:  Abnormal gait, Cardiopulmonary status limiting activity, Decreased activity tolerance, Decreased balance, Decreased endurance, Decreased knowledge of use of DME, Decreased range of motion, Decreased mobility, Decreased skin integrity, Decreased strength, Difficulty walking, Increased edema, Impaired flexibility, Postural dysfunction, Pain,  Prosthetic Dependency, Dizziness  Visit Diagnosis: Pain in left leg  Pain in right leg  Muscle weakness (generalized)  Contracture of muscle, multiple sites  Unsteadiness on feet     Problem List Patient Active Problem List   Diagnosis Date Noted  . Plantar fasciitis of left foot 06/10/2017  . Idiopathic chronic venous hypertension of both lower extremities with inflammation 05/21/2017  . Foot drop, left 05/21/2017  . Decubitus ulcer of sacral region, unstageable ([place])   . Deep tissue injury   . Hypokalemia   . Hypoalbuminemia due to protein-calorie malnutrition ([place])   . Debilitated 01/21/2017  . Neuropathic pain   . Post-operative pain   . Slow transit constipation   . Debility   . Ischemic leg   . PAF (paroxysmal atrial fibrillation) ([place])   . Anemia of chronic disease   . Chronic pain syndrome   . Fibromyalgia   . Stage 3 chronic kidney disease ([place])   . Benign essential HTN   . Abnormal urinalysis   . PVD (peripheral vascular disease) ([place]) 01/13/2017  . Post-op pain 01/03/2017  . Phantom limb pain ([place]) 01/03/2017  . S/P unilateral BKA (below knee amputation), right ([place]) 12/30/2016  . Acute on chronic combined systolic and diastolic CHF (congestive heart failure) ([place]) 12/22/2016  . Anemia 12/22/2016  . Palliative care encounter   . Lower extremity pain, right   . Acute GI bleeding 10/23/2016  . Paroxysmal atrial fibrillation ([place]) 10/03/2016  . Atherosclerosis of autologous vein bypass graft of left lower extremity with rest pain ([place]) 09/24/2016  . PAD (peripheral artery disease) ([place]) 09/05/2016  . Groin pain 04/02/2015  . Aftercare following surgery of the circulatory system, [place] 12/13/2013  . Shingles 10/26/2013  . Anxiety 10/26/2013  . Acute blood loss anemia 09/05/2013  . Elbow fracture, left 09/05/2013  . Left acetabular fracture ([place]) 09/05/2013  . Ankle fracture, left 09/05/2013  . HTN (hypertension) 09/03/2013  . HLD (hyperlipidemia)  09/03/2013  . Peripheral vascular disease, unspecified ([place]) 05/10/2012  . Chronic total occlusion of artery of the extremities ([facility]) 01/19/2012    Cyncere Ruhe PT, DPT 09/17/2017, 12:17 AM  [facility] [address] [place], [place], [address] Phone: [phone]   Fax:  [phone]  Name: [name] MRN: [id number] Date of Birth: Aug 21, 1928

## 2017-09-23 ENCOUNTER — Encounter: Payer: Self-pay | Admitting: Physical Therapy

## 2017-09-23 ENCOUNTER — Ambulatory Visit: Payer: Medicare Other | Attending: Family | Admitting: Physical Therapy

## 2017-09-23 DIAGNOSIS — M6281 Muscle weakness (generalized): Secondary | ICD-10-CM | POA: Insufficient documentation

## 2017-09-23 DIAGNOSIS — M79604 Pain in right leg: Secondary | ICD-10-CM

## 2017-09-23 DIAGNOSIS — R2689 Other abnormalities of gait and mobility: Secondary | ICD-10-CM | POA: Diagnosis not present

## 2017-09-23 DIAGNOSIS — M6249 Contracture of muscle, multiple sites: Secondary | ICD-10-CM

## 2017-09-23 DIAGNOSIS — Z7409 Other reduced mobility: Secondary | ICD-10-CM | POA: Diagnosis not present

## 2017-09-23 DIAGNOSIS — R2681 Unsteadiness on feet: Secondary | ICD-10-CM | POA: Diagnosis not present

## 2017-09-23 DIAGNOSIS — M79605 Pain in left leg: Secondary | ICD-10-CM | POA: Insufficient documentation

## 2017-09-23 NOTE — Therapy (Signed)
[facility] [address] [place], [place], [address] Phone: [phone]   Fax:  [phone]  Physical Therapy Treatment  Patient Details  Name: [name] MRN: [id number] Date of Birth: 03/06/1928 Referring Provider: Alger Simons, MD  (dtr requested use of Dr. Naaman Plummer for referral) [name], NP & [name], MD)   Encounter Date: 09/23/2017  PT End of Session - 09/23/17 1924    Visit Number  3    Number of Visits  17    Date for PT Re-Evaluation  11/06/17    Authorization Type  Medicare G-code & progress note every 10th visit    PT Start Time  1315    PT Stop Time  1400    PT Time Calculation (min)  45 min    Equipment Utilized During Treatment  Gait belt    Activity Tolerance  Patient limited by pain;Patient limited by fatigue    Behavior During Therapy  [facility] for tasks assessed/performed       Past Medical History:  Diagnosis Date  . Anemia   . Anginal pain ([place])   . Arthritis    "qwhere" (09/05/2016)  . Atrial fibrillation ([place]) 09/2016  . Chronic lower back pain   . Complication of anesthesia    "takes a long time for it to wear off; I can hallucinate if I take too much" (09/05/2016)  . DVT (deep venous thrombosis) ([place]) 10/2009  . Fall from steps 08/31/2013   Fx. pelvis, Left Hip, Left Elbow  . Fibromyalgia   . GERD (gastroesophageal reflux disease)    09/22/16- "no too much anymore"  . GI bleed 10/24/2016  . High cholesterol   . History of blood transfusion   . History of hiatal hernia   . Hypertension   . 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 Osteoporosis   . Peripheral vascular disease ([place])    nonviable tissue Right foot  . PONV (postoperative nausea and vomiting)   . Squamous cell carcinoma of skin of right calf Aug. 2015  . Stroke ([place])    TIA's no residual  .  TIA (transient ischemic attack)    "several at once; none in a long time" (09/05/2016)    Past Surgical History:  Procedure Laterality Date  . CARPAL TUNNEL RELEASE Right   . CATARACT EXTRACTION W/ INTRAOCULAR LENS  IMPLANT, BILATERAL Bilateral   . COLONOSCOPY    . DILATION AND CURETTAGE OF UTERUS    . EYE SURGERY Right    "macular OR"  . FEMORAL ARTERY STENT  12-11-10   Left SFA  . INCISION AND DRAINAGE OF WOUND Left 10/25/2009   leg/notes 11/13/2009  . JOINT REPLACEMENT     knee  . JOINT REPLACEMENT Left Oct. 17, 2014   Elbow ( pt fell 08-31-13 )  . ORIF SHOULDER FRACTURE Right    "it was crushed"  . PERIPHERAL VASCULAR CATHETERIZATION Right 09/05/2016   "stent"  . PR VEIN BYPASS GRAFT,AORTO-FEM-POP  09-13-09   Left Fem-pop  . TOTAL KNEE ARTHROPLASTY Left 06/2006  . TUBAL LIGATION    . VAGINAL HYSTERECTOMY      There were no vitals filed for this visit.  Subjective Assessment - 09/23/17 1323    Subjective  She is wearing 3hrs first time and 2nd wear 1-2 hrs. She has been elevating most of time as  PT recommended.    Patient is accompained by:  Family member    Pertinent History  Right TTA 12/23/16, left foot drop, Left bypass graft Oct 2010 & 2018, arthritis, A-fib, DVT, fall from stetps fx pelvis, left hip & left elbow, fx right shoulder, fibromyalgia, HTN, macular degeneration blind right eye, osteoporosis, PVD, CVA/TIA,     Patient Stated Goals  To use prosthesis to get around her house and get into car to go places like church.     Currently in Pain?  Yes    Pain Score  3     Pain Location  Leg    Pain Orientation  Left    Pain Descriptors / Indicators  Aching    Pain Type  Chronic pain    Pain Onset  More than a month ago    Pain Frequency  Constant    Aggravating Factors   arthritis, wearing AFO, hanging down                      [facility] Adult PT Treatment/Exercise - 09/23/17 1315      Transfers   Transfers  Sit to Stand;Stand to Sit;Lateral/Scoot  Transfers    Sit to Stand  4: Min assist;4: Min guard;With upper extremity assist;With armrests;From chair/3-in-1 to parallel bars & to RW platform left   to parallel bars & to RW platform left   Sit to Stand Details  Tactile cues for weight shifting;Verbal cues for technique    Stand to Sit  4: Min guard;With upper extremity assist;With armrests;To chair/3-in-1 from parallel bars & from RW platform left   from parallel bars & from RW platform left   Stand to Sit Details (indicate cue type and reason)  Tactile cues for sequencing;Verbal cues for sequencing;Verbal cues for technique    Lateral/Scoot Transfers  5: Supervision;With slide board;With armrests removed between level & unlevel 2" difference   between level & unlevel 2" difference   Lateral/Scoot Transfer Details (indicate cue type and reason)  PT cued using prosthesis & LEs       Ambulation/Gait   Ambulation/Gait  Yes    Ambulation/Gait Assistance  3: Mod assist;4: Min assist    Ambulation/Gait Assistance Details  verbal & tactile cues on sequence, upright posture & step width    Ambulation Distance (Feet)  25 Feet 10' parallel bars, 25' & 10' with left platfrom RW   10' parallel bars, 25' & 10' with left platfrom RW   Assistive device  Left platform walker;Parallel bars;Prosthesis;Other (Comment) AFO   AFO   Ambulation Surface  Indoor;Level      Prosthetics   Prosthetic Care Comments   wear cut-off sock under proximal liner to protect skin. Wear liner 3hrs 2x/day and prosthesis as much of time as tolerated during that 3hrs 2x/day. Continue use of footy sock & lower leg sock under AFO.     Current prosthetic wear tolerance (days/week)   daily    Current prosthetic wear tolerance (#hours/day)   wearing 3 hrs first time then 1-3 hrs second wear.     Edema  pitting bilaterally    Residual limb condition   slight redness on small toe with AFO wear;  RIght limb with redness proximal to knee from liner friction.     Education Provided   Skin check;Residual limb care;Correct ply sock adjustment;Proper Donning;Proper wear schedule/adjustment    Person(s) Educated  Patient;Child(ren)    Education Method  Explanation;Verbal cues;Demonstration;Tactile cues    Education Method  Verbalized understanding;Verbal cues required;Needs further instruction               PT Short Term Goals - 09/16/17 2010      PT SHORT TERM GOAL #1   Title  Patient & dtr demonstrate proper donning of prosthesis & AFO. (All STGs Target Date 10/07/2017)    Time  4    Period  Weeks    Status  On-going    Target Date  10/07/17      PT SHORT TERM GOAL #2   Title  Patient tolerates wear of prosthesis & AFO >5 hrs total/day with skin issues.     Time  4    Period  Weeks    Status  On-going    Target Date  10/07/17      PT SHORT TERM GOAL #3   Title  Patient sit to/from stand w/c to RW with minA.    Time  4    Period  Weeks    Status  On-going    Target Date  10/07/17      PT SHORT TERM GOAL #4   Title  Patient able to stand with RW support with prosthesis & RW for 2 minutes with supervision.     Time  4    Period  Weeks    Status  On-going    Target Date  10/07/17      PT SHORT TERM GOAL #5   Title  Patient ambulates 39' with RW, prosthesis & AFO with modA.    Time  4    Period  Weeks    Status  On-going    Target Date  10/07/17        PT Long Term Goals - 09/16/17 2011      PT LONG TERM GOAL #1   Title  Patient's daughter & patient verbalize proper prosthetic care. (All LTGs Target Date 11/06/2017)    Time  8    Period  Weeks    Status  New    Target Date  11/06/17      PT LONG TERM GOAL #2   Title  Patient tolerates wear of TTA prosthesis & AFO >50% of awake hours without skin issues.     Time  8    Period  Weeks    Status  On-going    Target Date  11/06/17      PT LONG TERM GOAL #3   Title  Patient performs sit to/from stand transfers chairs with armrests to RW with supervision.     Time  8    Period  Weeks     Status  On-going    Target Date  11/06/17      PT LONG TERM GOAL #4   Title  Patient standing balance with prosthesis, AFO & RW reaching 5" anterior to RW, scanning environment with head turns and managing pants for toileting with minA.     Time  8    Period  Weeks    Status  On-going    Target Date  11/06/17      PT LONG TERM GOAL #5   Title  Patient ambulates 39' with RW, prosthesis & AFO with minA.    Time  8    Period  Weeks    Status  On-going    Target Date  11/06/17            Plan - 09/23/17 1925    Clinical Impression Statement  Patient was  able to engage LEs with transfers with cues. PT was able to progress gait with prosthesis & AFO with left platform RW.     Rehab Potential  Good    Clinical Impairments Affecting Rehab Potential  Right TTA 12/23/16, left foot drop, L bypass graft Oct 2010, arthritis, A-fib, DVT, Blood clot LLE foot drop, R TTA, fall from stetps fx pelvis, left hip & left elbow, fibromyalgia, HTN, macular degeneration blind right eye, osteoporosis, PVD, CVA, TIA, right elbow replacement, chronic LBP,     PT Frequency  2x / week    PT Duration  8 weeks    PT Treatment/Interventions  ADLs/Self Care Home Management;Moist Heat;DME Instruction;Gait training;Stair training;Functional mobility training;Therapeutic activities;Therapeutic exercise;Balance training;Neuromuscular re-education;Patient/family education;Orthotic Fit/Training;Prosthetic Training;Vestibular    PT Next Visit Plan  review prosthetic & orthotic care, prosthetic gait with left platform RW    Consulted and Agree with Plan of Care  Patient;Family member/caregiver    Family Member Consulted  dtr       Patient will benefit from skilled therapeutic intervention in order to improve the following deficits and impairments:  Abnormal gait, Cardiopulmonary status limiting activity, Decreased activity tolerance, Decreased balance, Decreased endurance, Decreased knowledge of use of DME, Decreased  range of motion, Decreased mobility, Decreased skin integrity, Decreased strength, Difficulty walking, Increased edema, Impaired flexibility, Postural dysfunction, Pain, Prosthetic Dependency, Dizziness  Visit Diagnosis: Pain in left leg  Pain in right leg  Muscle weakness (generalized)  Contracture of muscle, multiple sites  Unsteadiness on feet  Other abnormalities of gait and mobility     Problem List Patient Active Problem List   Diagnosis Date Noted  . Plantar fasciitis of left foot 06/10/2017  . Idiopathic chronic venous hypertension of both lower extremities with inflammation 05/21/2017  . Foot drop, left 05/21/2017  . Decubitus ulcer of sacral region, unstageable ([place])   . Deep tissue injury   . Hypokalemia   . Hypoalbuminemia due to protein-calorie malnutrition ([place])   . Debilitated 01/21/2017  . Neuropathic pain   . Post-operative pain   . Slow transit constipation   . Debility   . Ischemic leg   . PAF (paroxysmal atrial fibrillation) ([place])   . Anemia of chronic disease   . Chronic pain syndrome   . Fibromyalgia   . Stage 3 chronic kidney disease ([place])   . Benign essential HTN   . Abnormal urinalysis   . PVD (peripheral vascular disease) ([place]) 01/13/2017  . Post-op pain 01/03/2017  . Phantom limb pain ([place]) 01/03/2017  . S/P unilateral BKA (below knee amputation), right ([place]) 12/30/2016  . Acute on chronic combined systolic and diastolic CHF (congestive heart failure) ([place]) 12/22/2016  . Anemia 12/22/2016  . Palliative care encounter   . Lower extremity pain, right   . Acute GI bleeding 10/23/2016  . Paroxysmal atrial fibrillation ([place]) 10/03/2016  . 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 PAD (peripheral artery disease) ([place]) 09/05/2016  . Groin pain 04/02/2015  . Aftercare following surgery of the circulatory system, [place] 12/13/2013  . Shingles 10/26/2013  . Anxiety 10/26/2013  .  Acute blood loss anemia 09/05/2013  . Elbow fracture, left 09/05/2013  . Left acetabular fracture ([place]) 09/05/2013  . Ankle fracture, left 09/05/2013  . HTN (hypertension) 09/03/2013  . HLD (hyperlipidemia) 09/03/2013  . Peripheral vascular disease, unspecified ([place]) 05/10/2012  . Chronic total occlusion of artery of the extremities ([facility]) 01/19/2012    Damante Spragg PT, DPT 09/23/2017,  7:28 PM  [facility] [address] [place], [place], [address] Phone: [phone]   Fax:  [phone]  Name: GAYNA BRADDY MRN: [id number] Date of Birth: 12-Jul-1928

## 2017-09-25 ENCOUNTER — Ambulatory Visit: Payer: Medicare Other | Admitting: Physical Therapy

## 2017-09-25 ENCOUNTER — Encounter: Payer: Self-pay | Admitting: Physical Therapy

## 2017-09-25 DIAGNOSIS — R2689 Other abnormalities of gait and mobility: Secondary | ICD-10-CM

## 2017-09-25 DIAGNOSIS — M6281 Muscle weakness (generalized): Secondary | ICD-10-CM | POA: Diagnosis not present

## 2017-09-25 DIAGNOSIS — M79605 Pain in left leg: Secondary | ICD-10-CM | POA: Diagnosis not present

## 2017-09-25 DIAGNOSIS — M79604 Pain in right leg: Secondary | ICD-10-CM | POA: Diagnosis not present

## 2017-09-25 DIAGNOSIS — M6249 Contracture of muscle, multiple sites: Secondary | ICD-10-CM | POA: Diagnosis not present

## 2017-09-25 DIAGNOSIS — R2681 Unsteadiness on feet: Secondary | ICD-10-CM | POA: Diagnosis not present

## 2017-09-26 NOTE — Therapy (Signed)
[facility] [address] [place], [place], [address] Phone: [phone]   Fax:  [phone]  Physical Therapy Treatment  Patient Details  Name: [name] MRN: [id number] Date of Birth: 24-Feb-1928 Referring Provider: Alger Simons, MD  (dtr requested use of Dr. Naaman Plummer for referral) [name], NP & [name], MD)   Encounter Date: 09/25/2017  PT End of Session - 09/25/17 1700    Visit Number  4    Number of Visits  17    Date for PT Re-Evaluation  11/06/17    Authorization Type  Medicare G-code & progress note every 10th visit    PT Start Time  1235    PT Stop Time  1314    PT Time Calculation (min)  39 min    Equipment Utilized During Treatment  Gait belt    Activity Tolerance  Patient limited by pain;Patient limited by fatigue    Behavior During Therapy  [facility] for tasks assessed/performed       Past Medical History:  Diagnosis Date  . Anemia   . Anginal pain ([place])   . Arthritis    "qwhere" (09/05/2016)  . Atrial fibrillation ([place]) 09/2016  . Chronic lower back pain   . Complication of anesthesia    "takes a long time for it to wear off; I can hallucinate if I take too much" (09/05/2016)  . DVT (deep venous thrombosis) ([place]) 10/2009  . Fall from steps 08/31/2013   Fx. pelvis, Left Hip, Left Elbow  . Fibromyalgia   . GERD (gastroesophageal reflux disease)    09/22/16- "no too much anymore"  . GI bleed 10/24/2016  . High cholesterol   . History of blood transfusion   . History of hiatal hernia   . Hypertension   . Macular degeneration, wet ([place])    "started in right eye; now legally blind in that eye; now started in left eye but pretty much in control" (09/05/2016)  . Osteoporosis   . Peripheral vascular disease ([place])    nonviable tissue Right foot  . PONV (postoperative nausea and vomiting)   . Squamous cell carcinoma of skin of right calf Aug. 2015  . Stroke ([place])    TIA's no residual  .  TIA (transient ischemic attack)    "several at once; none in a long time" (09/05/2016)    Past Surgical History:  Procedure Laterality Date  . CARPAL TUNNEL RELEASE Right   . CATARACT EXTRACTION W/ INTRAOCULAR LENS  IMPLANT, BILATERAL Bilateral   . COLONOSCOPY    . DILATION AND CURETTAGE OF UTERUS    . EYE SURGERY Right    "macular OR"  . FEMORAL ARTERY STENT  12-11-10   Left SFA  . INCISION AND DRAINAGE OF WOUND Left 10/25/2009   leg/notes 11/13/2009  . JOINT REPLACEMENT     knee  . JOINT REPLACEMENT Left Oct. 17, 2014   Elbow ( pt fell 08-31-13 )  . ORIF SHOULDER FRACTURE Right    "it was crushed"  . PERIPHERAL VASCULAR CATHETERIZATION Right 09/05/2016   "stent"  . PR VEIN BYPASS GRAFT,AORTO-FEM-POP  09-13-09   Left Fem-pop  . TOTAL KNEE ARTHROPLASTY Left 06/2006  . TUBAL LIGATION    . VAGINAL HYSTERECTOMY      There were no vitals filed for this visit.  Subjective Assessment - 09/25/17 1238    Subjective  No new complaints. Reports the pain is "no more than ususal, I can tolerate it"    Patient  is accompained by:  Family member    Pertinent History  Right TTA 12/23/16, left foot drop, Left bypass graft Oct 2010 & 2018, arthritis, A-fib, DVT, fall from stetps fx pelvis, left hip & left elbow, fx right shoulder, fibromyalgia, HTN, macular degeneration blind right eye, osteoporosis, PVD, CVA/TIA,     Limitations  Lifting;Standing;Walking;House hold activities    Patient Stated Goals  To use prosthesis to get around her house and get into car to go places like church.     Currently in Pain?  Yes    Pain Score  5     Pain Location  Leg    Pain Orientation  Left    Pain Descriptors / Indicators  Aching    Pain Type  Chronic pain    Pain Onset  More than a month ago    Pain Frequency  Constant            [facility] Adult PT Treatment/Exercise - 09/25/17 1250      Transfers   Transfers  Sit to Stand;Stand to Sit    Sit to Stand  4: Min assist;3: Mod assist;With upper  extremity assist;From bed    Sit to Stand Details  Tactile cues for placement;Verbal cues for sequencing;Verbal cues for technique;Verbal cues for precautions/safety;Verbal cues for safe use of DME/AE;Manual facilitation for weight bearing;Manual facilitation for weight shifting    Sit to Stand Details (indicate cue type and reason)  cues/assistance to place feet as needed to stand, cues/assistance needed for anterior weight shifitng and to "power through" legs to stand each time.     Stand to Sit  4: Min assist;With upper extremity assist;To bed    Stand to Sit Details (indicate cue type and reason)  Verbal cues for sequencing;Verbal cues for technique;Verbal cues for safe use of DME/AE;Manual facilitation for weight shifting;Verbal cues for precautions/safety    Stand to Sit Details  cues to reach back and use arm to assist with sitting. assistance needed each time for a controlled descent.     Lateral/Scoot Transfers  5: Supervision;With slide board;With armrests removed    Comments  sit/stand x2 mat<>RW. worked on posture, weight shifting and fwd/bwd stepping on 1st stance. worked on gait with second stand.       Ambulation/Gait   Ambulation/Gait  Yes    Ambulation/Gait Assistance  3: Mod assist    Ambulation/Gait Assistance Details  multimodal cues/assistance needed for posture, weight shifting, walker management and step placement. distance limited by pain in left foot with weight bearing.     Ambulation Distance (Feet)  20 Feet    Assistive device  Left platform walker    Gait Pattern  Step-to pattern;Decreased step length - left;Decreased stance time - left;Decreased stride length;Decreased hip/knee flexion - left;Decreased dorsiflexion - left;Decreased weight shift to left;Left hip hike;Left steppage;Right flexed knee in stance;Left flexed knee in stance;Antalgic;Lateral trunk lean to right;Trunk flexed;Poor foot clearance - left    Ambulation Surface  Level;Indoor      Prosthetics    Prosthetic Care Comments   continue to use barrier socks for skin protection. total assist to don all components of prosthesis and AFO.     Current prosthetic wear tolerance (days/week)   daily    Current prosthetic wear tolerance (#hours/day)   liner/socks 3 hours 2x day, others as able    Residual limb condition   slight redness on small toe with AFO wear;  RIght limb with redness proximal to knee from liner friction.  Education Provided  Residual limb care;Proper wear schedule/adjustment;Proper weight-bearing schedule/adjustment    Person(s) Educated  Patient;Child(ren)    Education Method  Explanation;Demonstration;Verbal cues    Education Method  Verbalized understanding;Verbal cues required;Needs further instruction    Donning Prosthesis  +1 total assist    Doffing Prosthesis  +1 total assist         PT Short Term Goals - 09/16/17 2010      PT SHORT TERM GOAL #1   Title  Patient & dtr demonstrate proper donning of prosthesis & AFO. (All STGs Target Date 10/07/2017)    Time  4    Period  Weeks    Status  On-going    Target Date  10/07/17      PT SHORT TERM GOAL #2   Title  Patient tolerates wear of prosthesis & AFO >5 hrs total/day with skin issues.     Time  4    Period  Weeks    Status  On-going    Target Date  10/07/17      PT SHORT TERM GOAL #3   Title  Patient sit to/from stand w/c to RW with minA.    Time  4    Period  Weeks    Status  On-going    Target Date  10/07/17      PT SHORT TERM GOAL #4   Title  Patient able to stand with RW support with prosthesis & RW for 2 minutes with supervision.     Time  4    Period  Weeks    Status  On-going    Target Date  10/07/17      PT SHORT TERM GOAL #5   Title  Patient ambulates 33' with RW, prosthesis & AFO with modA.    Time  4    Period  Weeks    Status  On-going    Target Date  10/07/17        PT Long Term Goals - 09/16/17 2011      PT LONG TERM GOAL #1   Title  Patient's daughter & patient verbalize  proper prosthetic care. (All LTGs Target Date 11/06/2017)    Time  8    Period  Weeks    Status  New    Target Date  11/06/17      PT LONG TERM GOAL #2   Title  Patient tolerates wear of TTA prosthesis & AFO >50% of awake hours without skin issues.     Time  8    Period  Weeks    Status  On-going    Target Date  11/06/17      PT LONG TERM GOAL #3   Title  Patient performs sit to/from stand transfers chairs with armrests to RW with supervision.     Time  8    Period  Weeks    Status  On-going    Target Date  11/06/17      PT LONG TERM GOAL #4   Title  Patient standing balance with prosthesis, AFO & RW reaching 5" anterior to RW, scanning environment with head turns and managing pants for toileting with minA.     Time  8    Period  Weeks    Status  On-going    Target Date  11/06/17      PT LONG TERM GOAL #5   Title  Patient ambulates 64' with RW, prosthesis & AFO with minA.    Time  8  Period  Weeks    Status  On-going    Target Date  11/06/17         Plan - 09/25/17 1700    Clinical Impression Statement  Today's skilled session continued to address transfers and gait with prosthesis/AFO. Pt continues to be limited by pain in left foot/leg with weight bearing. Wanted pt to wear both prosthesis/AFO out of therapy, however daughter reported they "get in the way/tangled up" with transferring into high SUV with them on and wanted to remove them. Will need to address car transfers at a future visit weather permitting. Pt is making slow, steady progress toward goals and should benefit from continued PT to progress toward unmet goals.     Rehab Potential  Good    Clinical Impairments Affecting Rehab Potential  Right TTA 12/23/16, left foot drop, L bypass graft Oct 2010, arthritis, A-fib, DVT, Blood clot LLE foot drop, R TTA, fall from stetps fx pelvis, left hip & left elbow, fibromyalgia, HTN, macular degeneration blind right eye, osteoporosis, PVD, CVA, TIA, right elbow replacement,  chronic LBP,     PT Frequency  2x / week    PT Duration  8 weeks    PT Treatment/Interventions  ADLs/Self Care Home Management;Moist Heat;DME Instruction;Gait training;Stair training;Functional mobility training;Therapeutic activities;Therapeutic exercise;Balance training;Neuromuscular re-education;Patient/family education;Orthotic Fit/Training;Prosthetic Training;Vestibular    PT Next Visit Plan  review prosthetic & orthotic care, prosthetic gait with left platform RW, ? car transfers with prosthesis/AFO donned    Consulted and Agree with Plan of Care  Patient;Family member/caregiver    Family Member Consulted  dtr       Patient will benefit from skilled therapeutic intervention in order to improve the following deficits and impairments:  Abnormal gait, Cardiopulmonary status limiting activity, Decreased activity tolerance, Decreased balance, Decreased endurance, Decreased knowledge of use of DME, Decreased range of motion, Decreased mobility, Decreased skin integrity, Decreased strength, Difficulty walking, Increased edema, Impaired flexibility, Postural dysfunction, Pain, Prosthetic Dependency, Dizziness  Visit Diagnosis: Muscle weakness (generalized)  Unsteadiness on feet  Other abnormalities of gait and mobility     Problem List Patient Active Problem List   Diagnosis Date Noted  . Plantar fasciitis of left foot 06/10/2017  . Idiopathic chronic venous hypertension of both lower extremities with inflammation 05/21/2017  . Foot drop, left 05/21/2017  . Decubitus ulcer of sacral region, unstageable ([place])   . Deep tissue injury   . Hypokalemia   . Hypoalbuminemia due to protein-calorie malnutrition ([place])   . Debilitated 01/21/2017  . Neuropathic pain   . Post-operative pain   . Slow transit constipation   . Debility   . Ischemic leg   . PAF (paroxysmal atrial fibrillation) ([place])   . Anemia of chronic disease   . Chronic pain syndrome   . Fibromyalgia   . Stage 3 chronic  kidney disease ([place])   . Benign essential HTN   . Abnormal urinalysis   . PVD (peripheral vascular disease) ([place]) 01/13/2017  . Post-op pain 01/03/2017  . Phantom limb pain ([place]) 01/03/2017  . S/P unilateral BKA (below knee amputation), right ([place]) 12/30/2016  . Acute on chronic combined systolic and diastolic CHF (congestive heart failure) ([place]) 12/22/2016  . Anemia 12/22/2016  . Palliative care encounter   . Lower extremity pain, right   . Acute GI bleeding 10/23/2016  . Paroxysmal atrial fibrillation ([place]) 10/03/2016  . Atherosclerosis of autologous vein bypass graft of left lower extremity with rest pain ([place]) 09/24/2016  . PAD (peripheral artery disease) (  [place]) 09/05/2016  . Groin pain 04/02/2015  . Aftercare following surgery of the circulatory system, [place] 12/13/2013  . Shingles 10/26/2013  . Anxiety 10/26/2013  . Acute blood loss anemia 09/05/2013  . Elbow fracture, left 09/05/2013  . Left acetabular fracture ([place]) 09/05/2013  . Ankle fracture, left 09/05/2013  . HTN (hypertension) 09/03/2013  . HLD (hyperlipidemia) 09/03/2013  . Peripheral vascular disease, unspecified ([place]) 05/10/2012  . Chronic total occlusion of artery of the extremities ([facility]) 01/19/2012    Willow Ora, PTA, [facility] [address], [place], [place] [address] [phone] 09/26/17, 12:25 PM   Name: [name] MRN: [id number] Date of Birth: 1928-02-24

## 2017-09-29 ENCOUNTER — Encounter: Payer: Self-pay | Admitting: Physical Therapy

## 2017-09-29 ENCOUNTER — Telehealth: Payer: Self-pay | Admitting: Physical Therapy

## 2017-09-29 ENCOUNTER — Ambulatory Visit: Payer: Medicare Other | Admitting: Physical Therapy

## 2017-09-29 DIAGNOSIS — M79604 Pain in right leg: Secondary | ICD-10-CM | POA: Diagnosis not present

## 2017-09-29 DIAGNOSIS — R2681 Unsteadiness on feet: Secondary | ICD-10-CM

## 2017-09-29 DIAGNOSIS — Z89511 Acquired absence of right leg below knee: Secondary | ICD-10-CM

## 2017-09-29 DIAGNOSIS — M79605 Pain in left leg: Secondary | ICD-10-CM | POA: Diagnosis not present

## 2017-09-29 DIAGNOSIS — M6249 Contracture of muscle, multiple sites: Secondary | ICD-10-CM

## 2017-09-29 DIAGNOSIS — R2689 Other abnormalities of gait and mobility: Secondary | ICD-10-CM

## 2017-09-29 DIAGNOSIS — M6281 Muscle weakness (generalized): Secondary | ICD-10-CM | POA: Diagnosis not present

## 2017-09-29 DIAGNOSIS — Z7409 Other reduced mobility: Secondary | ICD-10-CM

## 2017-09-29 NOTE — Therapy (Signed)
[facility] [address] [place], [place], [address] Phone: [phone]   Fax:  [phone]  Physical Therapy Treatment  Patient Details  Name: [name] MRN: [id number] Date of Birth: October 15, 1928 Referring Provider: Alger Simons, MD  (dtr requested use of Dr. Naaman Plummer for referral) [name], NP & [name], MD)   Encounter Date: 09/29/2017  PT End of Session - 09/29/17 1429    Visit Number  5    Number of Visits  17    Date for PT Re-Evaluation  11/06/17    Authorization Type  Medicare G-code & progress note every 10th visit    PT Start Time  0102    PT Stop Time  1315    PT Time Calculation (min)  40 min    Equipment Utilized During Treatment  Gait belt    Activity Tolerance  Patient limited by pain;Patient limited by fatigue;Patient tolerated treatment well    Behavior During Therapy  [facility] for tasks assessed/performed       Past Medical History:  Diagnosis Date  . Anemia   . Anginal pain ([place])   . Arthritis    "qwhere" (09/05/2016)  . Atrial fibrillation ([place]) 09/2016  . Chronic lower back pain   . Complication of anesthesia    "takes a long time for it to wear off; I can hallucinate if I take too much" (09/05/2016)  . DVT (deep venous thrombosis) ([place]) 10/2009  . Fall from steps 08/31/2013   Fx. pelvis, Left Hip, Left Elbow  . Fibromyalgia   . GERD (gastroesophageal reflux disease)    09/22/16- "no too much anymore"  . GI bleed 10/24/2016  . High cholesterol   . History of blood transfusion   . History of hiatal hernia   . Hypertension   . Macular degeneration, wet ([place])    "started in right eye; now legally blind in that eye; now started in left eye but pretty much in control" (09/05/2016)  . Osteoporosis   . Peripheral vascular disease ([place])    nonviable tissue Right foot  . PONV (postoperative nausea and vomiting)   . Squamous cell carcinoma of skin of right calf Aug. 2015  .  Stroke ([place])    TIA's no residual  . TIA (transient ischemic attack)    "several at once; none in a long time" (09/05/2016)    Past Surgical History:  Procedure Laterality Date  . CARPAL TUNNEL RELEASE Right   . CATARACT EXTRACTION W/ INTRAOCULAR LENS  IMPLANT, BILATERAL Bilateral   . COLONOSCOPY    . DILATION AND CURETTAGE OF UTERUS    . EYE SURGERY Right    "macular OR"  . FEMORAL ARTERY STENT  12-11-10   Left SFA  . INCISION AND DRAINAGE OF WOUND Left 10/25/2009   leg/notes 11/13/2009  . JOINT REPLACEMENT     knee  . JOINT REPLACEMENT Left Oct. 17, 2014   Elbow ( pt fell 08-31-13 )  . ORIF SHOULDER FRACTURE Right    "it was crushed"  . PERIPHERAL VASCULAR CATHETERIZATION Right 09/05/2016   "stent"  . PR VEIN BYPASS GRAFT,AORTO-FEM-POP  09-13-09   Left Fem-pop  . TOTAL KNEE ARTHROPLASTY Left 06/2006  . TUBAL LIGATION    . VAGINAL HYSTERECTOMY      There were no vitals filed for this visit.  Subjective Assessment - 09/29/17 1237    Subjective  Wearing prosthesis 3hrs one time daily and sometimes up to 2hrs second. The AFO hurts so  much she can only tolerate ~2 hrs max.    Patient is accompained by:  Family member    Pertinent History  Right TTA 12/23/16, left foot drop, Left bypass graft Oct 2010 & 2018, arthritis, A-fib, DVT, fall from stetps fx pelvis, left hip & left elbow, fx right shoulder, fibromyalgia, HTN, macular degeneration blind right eye, osteoporosis, PVD, CVA/TIA,     Limitations  Lifting;Standing;Walking;House hold activities    Patient Stated Goals  To use prosthesis to get around her house and get into car to go places like church.     Currently in Pain?  Yes    Pain Score  6     Pain Location  Leg    Pain Orientation  Left    Pain Descriptors / Indicators  Aching    Pain Type  Chronic pain    Pain Onset  More than a month ago    Pain Frequency  Constant    Aggravating Factors   arthritis, wearing AFO, hanging leg down    Pain Relieving Factors   medication or elevating.                      [facility] Adult PT Treatment/Exercise - 09/29/17 1230      Transfers   Transfers  Sit to Stand;Stand to Sit    Sit to Stand  4: Min assist;3: Mod assist;With upper extremity assist;With armrests;From chair/3-in-1 to RW, modA 1st time & minA 2nd    Sit to Stand Details  Tactile cues for placement;Verbal cues for sequencing;Verbal cues for technique;Verbal cues for precautions/safety;Verbal cues for safe use of DME/AE;Manual facilitation for weight bearing;Manual facilitation for weight shifting    Stand to Sit  4: Min guard;With upper extremity assist;With armrests;To chair/3-in-1 from RW    Stand to Sit Details (indicate cue type and reason)  Verbal cues for sequencing;Verbal cues for technique;Verbal cues for safe use of DME/AE;Manual facilitation for weight shifting;Verbal cues for precautions/safety    Lateral/Scoot Transfers  5: Supervision;With slide board;With armrests removed    Lateral/Scoot Transfer Details (indicate cue type and reason)  verbal cues to use BLEs to assist. PT assessed with Left AFO but T-strap not attached. Her left ankle did not supinate enough for instability or rolling with light weight bearing during a scooting transfer.       Ambulation/Gait   Ambulation/Gait  Yes    Ambulation/Gait Assistance  4: Min assist    Ambulation/Gait Assistance Details  Verbal & manual cues on sequence to moving RW after each LE to facilitate upright trunk with weight directly over stance limb.  Her LLE has lateral instability at hip & ankle in stance which causes RLE adduction.  PT verbally cued to maintain proper RLE step width.  Verbal cues to look up to facilitate upper body posture.     Ambulation Distance (Feet)  36 Feet 4' & 20'    Assistive device  Left platform walker;Prosthesis;Other (Comment) AFO    Gait Pattern  Decreased step length - left;Decreased stance time - left;Decreased stride length;Decreased hip/knee flexion -  left;Decreased dorsiflexion - left;Decreased weight shift to left;Left hip hike;Left steppage;Right flexed knee in stance;Left flexed knee in stance;Antalgic;Lateral trunk lean to right;Trunk flexed;Poor foot clearance - left;Step-to pattern    Ambulation Surface  Indoor;Level      Prosthetics   Prosthetic Care Comments   Increase wear 3 seperate focus points: 1. RLE liner work to tolerate most of awake hours so can donne prosthesis quicker  for function. Try liner & socks only 4hrs 2x/day for 4-5 days if no skin issues, then increase by 1hr.   2. prosthesis wear: attempt 3 hrs 2x/day.    3. AFO wear: attempt 3hrs 2x/day with footie sock & lower leg sock under AFO. Leave T-strap loose unless standing or walking / weight bearing on LLE.     Current prosthetic wear tolerance (days/week)   daily    Current prosthetic wear tolerance (#hours/day)   see prosthetic care comments.     Residual limb condition   RLE has redness in popliteal area possibly from shrinker as it rolls with wear. PT demo, instructed to use baby oil on area under liner to reduce friction. Pt & dtr verbalized understanding.   LLE little toe with redness but no bruising or skin abrasion.     Education Provided  Residual limb care;Proper wear schedule/adjustment;Proper weight-bearing schedule/adjustment;Proper Donning;Other (comment) see prosthetic care comments    Person(s) Educated  Patient;Child(ren)    Education Method  Explanation;Demonstration;Tactile cues;Verbal cues    Education Method  Verbalized understanding;Verbal cues required;Needs further instruction    Donning Prosthesis  +1 total assist    Doffing Prosthesis  +1 total assist               PT Short Term Goals - 09/16/17 2010      PT SHORT TERM GOAL #1   Title  Patient & dtr demonstrate proper donning of prosthesis & AFO. (All STGs Target Date 10/07/2017)    Time  4    Period  Weeks    Status  On-going    Target Date  10/07/17      PT SHORT TERM GOAL #2    Title  Patient tolerates wear of prosthesis & AFO >5 hrs total/day with skin issues.     Time  4    Period  Weeks    Status  On-going    Target Date  10/07/17      PT SHORT TERM GOAL #3   Title  Patient sit to/from stand w/c to RW with minA.    Time  4    Period  Weeks    Status  On-going    Target Date  10/07/17      PT SHORT TERM GOAL #4   Title  Patient able to stand with RW support with prosthesis & RW for 2 minutes with supervision.     Time  4    Period  Weeks    Status  On-going    Target Date  10/07/17      PT SHORT TERM GOAL #5   Title  Patient ambulates 75' with RW, prosthesis & AFO with modA.    Time  4    Period  Weeks    Status  On-going    Target Date  10/07/17        PT Long Term Goals - 09/16/17 2011      PT LONG TERM GOAL #1   Title  Patient's daughter & patient verbalize proper prosthetic care. (All LTGs Target Date 11/06/2017)    Time  8    Period  Weeks    Status  New    Target Date  11/06/17      PT LONG TERM GOAL #2   Title  Patient tolerates wear of TTA prosthesis & AFO >50% of awake hours without skin issues.     Time  8    Period  Weeks    Status  On-going    Target Date  11/06/17      PT LONG TERM GOAL #3   Title  Patient performs sit to/from stand transfers chairs with armrests to RW with supervision.     Time  8    Period  Weeks    Status  On-going    Target Date  11/06/17      PT LONG TERM GOAL #4   Title  Patient standing balance with prosthesis, AFO & RW reaching 5" anterior to RW, scanning environment with head turns and managing pants for toileting with minA.     Time  8    Period  Weeks    Status  On-going    Target Date  11/06/17      PT LONG TERM GOAL #5   Title  Patient ambulates 85' with RW, prosthesis & AFO with minA.    Time  8    Period  Weeks    Status  On-going    Target Date  11/06/17            Plan - 09/29/17 1430    Clinical Impression Statement  Focusing on wear of liner & socks only as  seperate time increase and prosthesis part of that time should improve her tolerance to wear and enable availibility for function.  Her AFO did not seem as uncomfortable when T-strap was left unattached until she needed to weight bear. She performed scooting transfer with prosthesis & AFO without T-strap attached safely.     Rehab Potential  Good    Clinical Impairments Affecting Rehab Potential  Right TTA 12/23/16, left foot drop, L bypass graft Oct 2010, arthritis, A-fib, DVT, Blood clot LLE foot drop, R TTA, fall from stetps fx pelvis, left hip & left elbow, fibromyalgia, HTN, macular degeneration blind right eye, osteoporosis, PVD, CVA, TIA, right elbow replacement, chronic LBP,     PT Frequency  2x / week    PT Duration  8 weeks    PT Treatment/Interventions  ADLs/Self Care Home Management;Moist Heat;DME Instruction;Gait training;Stair training;Functional mobility training;Therapeutic activities;Therapeutic exercise;Balance training;Neuromuscular re-education;Patient/family education;Orthotic Fit/Training;Prosthetic Training;Vestibular    PT Next Visit Plan  review prosthetic & orthotic care, prosthetic gait with left platform RW, car transfers with prosthesis/AFO donned    Consulted and Agree with Plan of Care  Patient;Family member/caregiver    Family Member Consulted  dtr       Patient will benefit from skilled therapeutic intervention in order to improve the following deficits and impairments:  Abnormal gait, Cardiopulmonary status limiting activity, Decreased activity tolerance, Decreased balance, Decreased endurance, Decreased knowledge of use of DME, Decreased range of motion, Decreased mobility, Decreased skin integrity, Decreased strength, Difficulty walking, Increased edema, Impaired flexibility, Postural dysfunction, Pain, Prosthetic Dependency, Dizziness  Visit Diagnosis: Muscle weakness (generalized)  Unsteadiness on feet  Other abnormalities of gait and mobility  Pain in left  leg  Pain in right leg  Contracture of muscle, multiple sites  Decreased independence with transfers     Problem List Patient Active Problem List   Diagnosis Date Noted  . Plantar fasciitis of left foot 06/10/2017  . Idiopathic chronic venous hypertension of both lower extremities with inflammation 05/21/2017  . Foot drop, left 05/21/2017  . Decubitus ulcer of sacral region, unstageable ([place])   . Deep tissue injury   . Hypokalemia   . Hypoalbuminemia due to protein-calorie malnutrition ([place])   . Debilitated 01/21/2017  . Neuropathic pain   . Post-operative pain   . Slow  transit constipation   . Debility   . Ischemic leg   . PAF (paroxysmal atrial fibrillation) ([place])   . Anemia of chronic disease   . Chronic pain syndrome   . Fibromyalgia   . Stage 3 chronic kidney disease ([place])   . Benign essential HTN   . Abnormal urinalysis   . PVD (peripheral vascular disease) ([place]) 01/13/2017  . Post-op pain 01/03/2017  . Phantom limb pain ([place]) 01/03/2017  . S/P unilateral BKA (below knee amputation), right ([place]) 12/30/2016  . Acute on chronic combined systolic and diastolic CHF (congestive heart failure) ([place]) 12/22/2016  . Anemia 12/22/2016  . Palliative care encounter   . Lower extremity pain, right   . Acute GI bleeding 10/23/2016  . Paroxysmal atrial fibrillation ([place]) 10/03/2016  . Atherosclerosis of autologous vein bypass graft of left lower extremity with rest pain ([place]) 09/24/2016  . PAD (peripheral artery disease) ([place]) 09/05/2016  . Groin pain 04/02/2015  . Aftercare following surgery of the circulatory system, [place] 12/13/2013  . Shingles 10/26/2013  . Anxiety 10/26/2013  . Acute blood loss anemia 09/05/2013  . Elbow fracture, left 09/05/2013  . Left acetabular fracture ([place]) 09/05/2013  . Ankle fracture, left 09/05/2013  . HTN (hypertension) 09/03/2013  . HLD (hyperlipidemia) 09/03/2013  . Peripheral vascular disease, unspecified ([place]) 05/10/2012  . Chronic  total occlusion of artery of the extremities ([place]) 01/19/2012    Katherene Dinino PT, DPT 09/29/2017, 2:36 PM  [facility] [address] [place], [place], [address] Phone: [phone]   Fax:  [phone]  Name: [name] MRN: [id number] Date of Birth: [birth year]

## 2017-09-29 NOTE — Telephone Encounter (Signed)
Dr. Naaman Plummer Ms. Brimage has been ambulating in PT with a left Platform rolling walker with prosthesis & AFO with minA. We would like to transition to some walking with daughter at home. Ms. Berkley has a rolling walker but no platform.  Can you please write a prescription for a platform attachment for walker? Thank you Jamey Reas, PT, DPT PT Specializing in [place] 09/19/2017@ 2:39 PM Phone:  [phone]  Fax:  [phone] [facility] [address] [place], [place] [address]

## 2017-09-30 NOTE — Telephone Encounter (Signed)
REFERRAL SENT

## 2017-10-01 ENCOUNTER — Ambulatory Visit: Payer: Medicare Other | Admitting: Physical Therapy

## 2017-10-06 ENCOUNTER — Ambulatory Visit: Payer: Medicare Other | Admitting: Physical Therapy

## 2017-10-06 DIAGNOSIS — M79604 Pain in right leg: Secondary | ICD-10-CM | POA: Diagnosis not present

## 2017-10-06 DIAGNOSIS — R2689 Other abnormalities of gait and mobility: Secondary | ICD-10-CM

## 2017-10-06 DIAGNOSIS — M79605 Pain in left leg: Secondary | ICD-10-CM

## 2017-10-06 DIAGNOSIS — R2681 Unsteadiness on feet: Secondary | ICD-10-CM

## 2017-10-06 DIAGNOSIS — M6249 Contracture of muscle, multiple sites: Secondary | ICD-10-CM | POA: Diagnosis not present

## 2017-10-06 DIAGNOSIS — M6281 Muscle weakness (generalized): Secondary | ICD-10-CM | POA: Diagnosis not present

## 2017-10-07 ENCOUNTER — Ambulatory Visit: Payer: Medicare Other | Admitting: Physical Therapy

## 2017-10-07 ENCOUNTER — Encounter: Payer: Self-pay | Admitting: Physical Therapy

## 2017-10-07 NOTE — Therapy (Signed)
[facility] [address] [place], [place], [address] Phone: [phone]   Fax:  [phone]  Physical Therapy Treatment  Patient Details  Name: [name] MRN: [id number] Date of Birth: [birth year] Referring Provider: Alger Simons, MD  (dtr requested use of Dr. Naaman Plummer for referral) [name], NP & [name], MD)   Encounter Date: 10/06/2017  PT End of Session - 10/06/17 1818    Visit Number  6  (Pended)     Number of Visits  60  (Pended)     Date for PT Re-Evaluation  11/06/17  (Pended)     Authorization Type  Medicare G-code & progress note every 10th visit  (Pended)     PT Start Time  1315  (Pended)     PT Stop Time  1405  (Pended)     PT Time Calculation (min)  50 min  (Pended)     Equipment Utilized During Treatment  Gait belt  (Pended)     Activity Tolerance  Patient limited by pain;Patient limited by fatigue;Patient tolerated treatment well  (Pended)     Behavior During Therapy  [facility] for tasks assessed/performed  (Pended)        Past Medical History:  Diagnosis Date  . Anemia   . Anginal pain ([place])   . Arthritis    "qwhere" (09/05/2016)  . Atrial fibrillation ([place]) 09/2016  . Chronic lower back pain   . Complication of anesthesia    "takes a long time for it to wear off; I can hallucinate if I take too much" (09/05/2016)  . DVT (deep venous thrombosis) ([place]) 10/2009  . Fall from steps 08/31/2013   Fx. pelvis, Left Hip, Left Elbow  . Fibromyalgia   . GERD (gastroesophageal reflux disease)    09/22/16- "no too much anymore"  . GI bleed 10/24/2016  . High cholesterol   . History of blood transfusion   . History of hiatal hernia   . Hypertension   . Macular degeneration, wet ([place])    "started in right eye; now legally blind in that eye; now started in left eye but pretty much in control" (09/05/2016)  . Osteoporosis   . Peripheral vascular disease ([place])    nonviable tissue Right foot  .  PONV (postoperative nausea and vomiting)   . Squamous cell carcinoma of skin of right calf Aug. 2015  . Stroke ([place])    TIA's no residual  . TIA (transient ischemic attack)    "several at once; none in a long time" (09/05/2016)    Past Surgical History:  Procedure Laterality Date  . ABDOMINAL AORTAGRAM N/A 12/26/2014   Procedure: ABDOMINAL Maxcine Ham;  Surgeon: Serafina Mitchell, MD;  Location: [facility] CATH LAB;  Service: Cardiovascular;  Laterality: N/A;  . AMPUTATION Right 12/23/2016   Procedure: AMPUTATION BELOW KNEE;  Surgeon: Serafina Mitchell, MD;  Location: [facility];  Service: Vascular;  Laterality: Right;  . AORTOGRAM N/A 09/26/2016   Procedure: AORTOGRAM;  Surgeon: Waynetta Sandy, MD;  Location: [facility];  Service: Vascular;  Laterality: N/A;  . CARPAL TUNNEL RELEASE Right   . CATARACT EXTRACTION W/ INTRAOCULAR LENS  IMPLANT, BILATERAL Bilateral   . COLONOSCOPY    . DILATION AND CURETTAGE OF UTERUS    . DRESSING CHANGE UNDER ANESTHESIA Right 01/16/2017   Procedure: DRESSING CHANGE RIGHT BELOW KNEE AMPUTATION;  Surgeon: Serafina Mitchell, MD;  Location: [facility];  Service: Vascular;  Laterality: Right;  . EYE SURGERY Right    "  macular OR"  . FEMORAL ARTERY STENT  12-11-10   Left SFA  . FEMORAL-POPLITEAL BYPASS GRAFT Left 09/24/2016   Procedure: REDO FEMORAL TO POPLITEAL ARTERY BYPASS GRAFT USING 6MM PROPATEN RINGED GORTEX GRAFT;  Surgeon: Serafina Mitchell, MD;  Location: [facility];  Service: Vascular;  Laterality: Left;  . FEMORAL-TIBIAL BYPASS GRAFT Left 01/16/2017   Procedure: REDO BYPASS GRAFT FEMORAL-TIBIAL ARTERY WITH GORTEX GRAFT;  Surgeon: Serafina Mitchell, MD;  Location: [facility];  Service: Vascular;  Laterality: Left;  AND LOWER LEG   . I&D EXTREMITY Right 12/17/2016   Procedure: IRRIGATION AND DEBRIDEMENT RIGHT FOOT;  Surgeon: Serafina Mitchell, MD;  Location: [facility];  Service: Vascular;  Laterality: Right;  . INCISION AND DRAINAGE OF WOUND Left 10/25/2009   leg/notes 11/13/2009  . INSERTION OF  ILIAC STENT Left 12/26/2014   Procedure: INSERTION OF ILIAC STENT;  Surgeon: Serafina Mitchell, MD;  Location: [facility] CATH LAB;  Service: Cardiovascular;  Laterality: Left;  . INSERTION OF ILIAC STENT Left 09/26/2016   Procedure: SUB INTIMAL INSERTION OF SUPERFICIAL FEMORAL ARTERY AND BELOW KNEE BYPASS GRAFT;  Surgeon: Waynetta Sandy, MD;  Location: [facility];  Service: Vascular;  Laterality: Left;  . JOINT REPLACEMENT     knee  . JOINT REPLACEMENT Left Oct. 17, 2014   Elbow ( pt fell 08-31-13 )  . ORIF SHOULDER FRACTURE Right    "it was crushed"  . PERIPHERAL VASCULAR CATHETERIZATION N/A 10/30/2015   Procedure: Abdominal Aortogram w/Lower Extremity;  Surgeon: Serafina Mitchell, MD;  Location: [facility] CV LAB;  Service: Cardiovascular;  Laterality: N/A;  . PERIPHERAL VASCULAR CATHETERIZATION  10/30/2015   Procedure: Peripheral Vascular Intervention;  Surgeon: Serafina Mitchell, MD;  Location: [place] CV LAB;  Service: Cardiovascular;;  . PERIPHERAL VASCULAR CATHETERIZATION N/A 04/01/2016   Procedure: Abdominal Aortogram w/Lower Extremity;  Surgeon: Serafina Mitchell, MD;  Location: [place] [facility] CV LAB;  Service: Cardiovascular;  Laterality: N/A;  . PERIPHERAL VASCULAR CATHETERIZATION Left 04/01/2016   Procedure: Peripheral Vascular Atherectomy;  Surgeon: Serafina Mitchell, MD;  Location: [facility] CV LAB;  Service: Cardiovascular;  Laterality: Left;  Superficial femoral artery.  Marland Kitchen PERIPHERAL VASCULAR CATHETERIZATION Right 09/05/2016   "stent"  . PERIPHERAL VASCULAR CATHETERIZATION N/A 09/05/2016   Procedure: Abdominal Aortogram w/Lower Extremity;  Surgeon: Serafina Mitchell, MD;  Location: [place] [facility] CV LAB;  Service: Cardiovascular;  Laterality: N/A;  . PERIPHERAL VASCULAR CATHETERIZATION Right 09/05/2016   Procedure: Peripheral Vascular Intervention;  Surgeon: Serafina Mitchell, MD;  Location: [facility] CV LAB;  Service: Cardiovascular;  Laterality: Right;  Superficial Femoral  . PERIPHERAL  VASCULAR CATHETERIZATION Left 09/09/2016   Procedure: Lower Extremity Angiography;  Surgeon: Serafina Mitchell, MD;  Location: [facility] CV LAB;  Service: Cardiovascular;  Laterality: Left;  . PR VEIN BYPASS GRAFT,AORTO-FEM-POP  09-13-09   Left Fem-pop  . THROMBECTOMY FEMORAL ARTERY Right 09/26/2016   Procedure: Thromboembolectomy Right Lower Extremity, Right Femoral Artery Endarterectomy with Patch Angioplasty; Right Lower Extremity Angiogram ;  Surgeon: Waynetta Sandy, MD;  Location: [facility];  Service: Vascular;  Laterality: Right;  . TOTAL ELBOW ARTHROPLASTY Left 09/03/2013   Procedure: LEFT TOTAL ELBOW ARTHROPLASTY;  Surgeon: Roseanne Kaufman, MD;  Location: [facility];  Service: Orthopedics;  Laterality: Left;  . TOTAL KNEE ARTHROPLASTY Left 06/2006  . TRANSMETATARSAL AMPUTATION Right 12/11/2016   Procedure: TRANSMETATARSAL AMPUTATION;  Surgeon: Serafina Mitchell, MD;  Location: [facility];  Service: Vascular;  Laterality: Right;  . TUBAL LIGATION    .  VAGINAL HYSTERECTOMY      There were no vitals filed for this visit.  Subjective Assessment - 10/06/17 1315    Subjective  Daughter went to [employer] to pick up platform attachment for her RW. She brought it to PT session with her RW for patient to put it on limb.    Patient is accompained by:  Family member    Pertinent History  Right TTA 12/23/16, left foot drop, Left bypass graft Oct 2010 & 2018, arthritis, A-fib, DVT, fall from stetps fx pelvis, left hip & left elbow, fx right shoulder, fibromyalgia, HTN, macular degeneration blind right eye, osteoporosis, PVD, CVA/TIA,     Limitations  Lifting;Standing;Walking;House hold activities    Patient Stated Goals  To use prosthesis to get around her house and get into car to go places like church.     Currently in Pain?  Yes    Pain Score  6     Pain Location  Leg    Pain Orientation  Left    Pain Descriptors / Indicators  Aching    Pain Type  Chronic pain    Pain Frequency  Constant     Aggravating Factors   arthritis, wearing AFO, hanging leg down    Pain Relieving Factors  medication or elevating            PT attempted to attach platform ([employer]) that [facility] issued pt to the patient's RW ([employer] brand). Due to different walker design with bars on side of RW, there was no way to attach platform without bolts protruding inward part of RW which is a skin integrity concern especially this patient. So daughter was going back to [employer] to find out if they had another option or way to attach it without bolts protruding.           [facility] Adult PT Treatment/Exercise - 10/06/17 1315      Transfers   Transfers  Sit to Stand;Stand to Sit    Sit to Stand  4: Min assist;4: Min guard;With upper extremity assist;With armrests;From chair/3-in-1 to RW platform, modA 1st time & minA 2nd    Sit to Stand Details  Tactile cues for placement;Verbal cues for sequencing;Verbal cues for technique;Verbal cues for precautions/safety;Verbal cues for safe use of DME/AE;Manual facilitation for weight bearing;Manual facilitation for weight shifting    Stand to Sit  4: Min guard;With upper extremity assist;With armrests;To chair/3-in-1 from RW left platform    Stand to Sit Details (indicate cue type and reason)  Verbal cues for sequencing;Verbal cues for technique;Verbal cues for safe use of DME/AE;Manual facilitation for weight shifting;Verbal cues for precautions/safety    Lateral/Scoot Transfers  --      Ambulation/Gait   Ambulation/Gait  Yes    Ambulation/Gait Assistance  4: Min assist    Ambulation/Gait Assistance Details  Manual cues (with PT instructing daughter) for lateral left hip stabilization in stance, verbal cues on step width to decrease scissoring.     Ambulation Distance (Feet)  60 Feet 60' X 2    Assistive device  Left platform walker;Prosthesis;Other (Comment) AFO    Gait Pattern  Decreased step length - left;Decreased stance time - left;Decreased stride  length;Decreased hip/knee flexion - left;Decreased dorsiflexion - left;Decreased weight shift to left;Left hip hike;Left steppage;Right flexed knee in stance;Left flexed knee in stance;Antalgic;Lateral trunk lean to right;Trunk flexed;Poor foot clearance - left;Step-to pattern    Ambulation Surface  Indoor;Level      Prosthetics   Prosthetic  Care Comments   wearing 3 seperate focus points: 1. RLE liner work to tolerate most of awake hours so can donne prosthesis quicker for function. Try liner & socks only 4hrs 2x/day for 4-5 days if no skin issues, then increase by 1hr.   2. prosthesis wear: attempt 3 hrs 2x/day.    3. AFO wear: attempt 3hrs 2x/day with footie sock & lower leg sock under AFO. Leave T-strap loose unless standing or walking / weight bearing on LLE.     Current prosthetic wear tolerance (days/week)   daily    Current prosthetic wear tolerance (#hours/day)   see prosthetic care comments.     Residual limb condition   --    Education Provided  Residual limb care;Proper wear schedule/adjustment;Proper weight-bearing schedule/adjustment;Proper Donning;Other (comment) see prosthetic care comments    Person(s) Educated  Patient;Child(ren)    Education Method  Explanation;Demonstration;Verbal cues    Education Method  Verbalized understanding;Verbal cues required;Needs further instruction               PT Short Term Goals - 10/07/17 1148      PT SHORT TERM GOAL #1   Title  Patient & dtr demonstrate proper donning of prosthesis & AFO. (All STGs Target Date 10/07/2017)    Baseline  MET 10/06/2017    Time  4    Period  Weeks    Status  Achieved      PT SHORT TERM GOAL #2   Title  Patient tolerates wear of prosthesis & AFO >5 hrs total/day with skin issues.     Baseline  Partially MET 10/06/2017  She tolerates wear of prosthetic liner >8 hrs total /day, prosthesis >5hrs total & AFO 2-4hrs total/day.     Time  4    Period  Weeks    Status  Partially Met      PT SHORT TERM  GOAL #3   Title  Patient sit to/from stand w/c to RW with minA.    Baseline  MET 10/06/2017    Time  4    Period  Weeks    Status  Achieved      PT SHORT TERM GOAL #4   Title  Patient able to stand with RW support with prosthesis & RW for 2 minutes with supervision.     Baseline  MET 10/06/2017    Time  4    Period  Weeks    Status  Achieved      PT SHORT TERM GOAL #5   Title  Patient ambulates 41' with RW, prosthesis & AFO with modA.    Baseline  MET 10/06/2017    Time  4    Period  Weeks    Status  Achieved        PT Long Term Goals - 09/16/17 2011      PT LONG TERM GOAL #1   Title  Patient's daughter & patient verbalize proper prosthetic care. (All LTGs Target Date 11/06/2017)    Time  8    Period  Weeks    Status  New    Target Date  11/06/17      PT LONG TERM GOAL #2   Title  Patient tolerates wear of TTA prosthesis & AFO >50% of awake hours without skin issues.     Time  8    Period  Weeks    Status  On-going    Target Date  11/06/17      PT LONG TERM GOAL #3  Title  Patient performs sit to/from stand transfers chairs with armrests to RW with supervision.     Time  8    Period  Weeks    Status  On-going    Target Date  11/06/17      PT LONG TERM GOAL #4   Title  Patient standing balance with prosthesis, AFO & RW reaching 5" anterior to RW, scanning environment with head turns and managing pants for toileting with minA.     Time  8    Period  Weeks    Status  On-going    Target Date  11/06/17      PT LONG TERM GOAL #5   Title  Patient ambulates 78' with RW, prosthesis & AFO with minA.    Time  8    Period  Weeks    Status  On-going    Target Date  11/06/17            Plan - 10/06/17 1852    Clinical Impression Statement  Patient would benefit from prosthetist doing gait analysis with adjustments to prosthesis & AFO while ambulating with PT. So PT arranged for prosthetist to add one appt next week (Thursday, 11/29) appt.  Patient met all STGs  and is progressing with mobility. Daughter should be able to assist some walking at home once platform issue is resolved.     Rehab Potential  Good    Clinical Impairments Affecting Rehab Potential  Right TTA 12/23/16, left foot drop, L bypass graft Oct 2010, arthritis, A-fib, DVT, Blood clot LLE foot drop, R TTA, fall from stetps fx pelvis, left hip & left elbow, fibromyalgia, HTN, macular degeneration blind right eye, osteoporosis, PVD, CVA, TIA, right elbow replacement, chronic LBP,     PT Frequency  2x / week    PT Duration  8 weeks    PT Treatment/Interventions  ADLs/Self Care Home Management;Moist Heat;DME Instruction;Gait training;Stair training;Functional mobility training;Therapeutic activities;Therapeutic exercise;Balance training;Neuromuscular re-education;Patient/family education;Orthotic Fit/Training;Prosthetic Training;Vestibular    PT Next Visit Plan  review prosthetic & orthotic care, prosthetic gait with left platform RW, car transfers with prosthesis/AFO donned    Consulted and Agree with Plan of Care  Patient;Family member/caregiver    Family Member Consulted  dtr       Patient will benefit from skilled therapeutic intervention in order to improve the following deficits and impairments:  Abnormal gait, Cardiopulmonary status limiting activity, Decreased activity tolerance, Decreased balance, Decreased endurance, Decreased knowledge of use of DME, Decreased range of motion, Decreased mobility, Decreased skin integrity, Decreased strength, Difficulty walking, Increased edema, Impaired flexibility, Postural dysfunction, Pain, Prosthetic Dependency, Dizziness  Visit Diagnosis: Muscle weakness (generalized)  Unsteadiness on feet  Other abnormalities of gait and mobility  Pain in left leg  Pain in right leg     Problem List Patient Active Problem List   Diagnosis Date Noted  . Plantar fasciitis of left foot 06/10/2017  . Idiopathic chronic venous hypertension of both lower  extremities with inflammation 05/21/2017  . Foot drop, left 05/21/2017  . Decubitus ulcer of sacral region, unstageable ([place])   . Deep tissue injury   . Hypokalemia   . Hypoalbuminemia due to protein-calorie malnutrition ([place])   . Debilitated 01/21/2017  . Neuropathic pain   . Post-operative pain   . Slow transit constipation   . Debility   . Ischemic leg   . PAF (paroxysmal atrial fibrillation) ([place])   . Anemia of chronic disease   . Chronic pain syndrome   .  Fibromyalgia   . Stage 3 chronic kidney disease ([place])   . Benign essential HTN   . Abnormal urinalysis   . PVD (peripheral vascular disease) ([place]) 01/13/2017  . Post-op pain 01/03/2017  . Phantom limb pain ([place]) 01/03/2017  . S/P unilateral BKA (below knee amputation), right ([place]) 12/30/2016  . Acute on chronic combined systolic and diastolic CHF (congestive heart failure) ([place]) 12/22/2016  . Anemia 12/22/2016  . Palliative care encounter   . Lower extremity pain, right   . Acute GI bleeding 10/23/2016  . Paroxysmal atrial fibrillation ([place]) 10/03/2016  . Atherosclerosis of autologous vein bypass graft of left lower extremity with rest pain ([place]) 09/24/2016  . PAD (peripheral artery disease) ([place]) 09/05/2016  . Groin pain 04/02/2015  . Aftercare following surgery of the circulatory system, [place] 12/13/2013  . Shingles 10/26/2013  . Anxiety 10/26/2013  . Acute blood loss anemia 09/05/2013  . Elbow fracture, left 09/05/2013  . Left acetabular fracture ([place]) 09/05/2013  . Ankle fracture, left 09/05/2013  . HTN (hypertension) 09/03/2013  . HLD (hyperlipidemia) 09/03/2013  . Peripheral vascular disease, unspecified ([place]) 05/10/2012  . Chronic total occlusion of artery of the extremities ([place]) 01/19/2012    Szymon Foiles PT, DPT 10/07/2017, 11:58 AM  [facility] [address] [place], [place], [address] Phone: [phone]   Fax:  [phone]  Name:  [name] MRN: [id number] Date of Birth: [birth year]

## 2017-10-13 ENCOUNTER — Encounter: Payer: Self-pay | Admitting: Physical Therapy

## 2017-10-13 ENCOUNTER — Ambulatory Visit: Payer: Medicare Other | Admitting: Physical Therapy

## 2017-10-13 DIAGNOSIS — R2681 Unsteadiness on feet: Secondary | ICD-10-CM | POA: Diagnosis not present

## 2017-10-13 DIAGNOSIS — R2689 Other abnormalities of gait and mobility: Secondary | ICD-10-CM

## 2017-10-13 DIAGNOSIS — M79605 Pain in left leg: Secondary | ICD-10-CM

## 2017-10-13 DIAGNOSIS — M6249 Contracture of muscle, multiple sites: Secondary | ICD-10-CM | POA: Diagnosis not present

## 2017-10-13 DIAGNOSIS — M79604 Pain in right leg: Secondary | ICD-10-CM | POA: Diagnosis not present

## 2017-10-13 DIAGNOSIS — M6281 Muscle weakness (generalized): Secondary | ICD-10-CM

## 2017-10-13 NOTE — Therapy (Signed)
[facility] [address] [place], [place], [address] Phone: [phone]   Fax:  [phone]  Physical Therapy Treatment  Patient Details  Name: [name] MRN: [id number] Date of Birth: 1928-03-11 Referring Provider: Alger Simons, MD  (dtr requested use of Dr. Naaman Plummer for referral) [name], NP & [name], MD)   Encounter Date: 10/13/2017  PT End of Session - 10/13/17 2143    Visit Number  7    Number of Visits  17    Date for PT Re-Evaluation  11/06/17    Authorization Type  Medicare G-code & progress note every 10th visit    PT Start Time  1230    PT Stop Time  1314    PT Time Calculation (min)  44 min    Equipment Utilized During Treatment  Gait belt    Activity Tolerance  Patient limited by pain;Patient limited by fatigue;Patient tolerated treatment well    Behavior During Therapy  [facility] for tasks assessed/performed       Past Medical History:  Diagnosis Date  . Anemia   . Anginal pain ([place])   . Arthritis    "qwhere" (09/05/2016)  . Atrial fibrillation ([place]) 09/2016  . Chronic lower back pain   . Complication of anesthesia    "takes a long time for it to wear off; I can hallucinate if I take too much" (09/05/2016)  . DVT (deep venous thrombosis) ([place]) 10/2009  . Fall from steps 08/31/2013   Fx. pelvis, Left Hip, Left Elbow  . Fibromyalgia   . GERD (gastroesophageal reflux disease)    09/22/16- "no too much anymore"  . GI bleed 10/24/2016  . High cholesterol   . History of blood transfusion   . History of hiatal hernia   . Hypertension   . Macular degeneration, wet ([place])    "started in right eye; now legally blind in that eye; now started in left eye but pretty much in control" (09/05/2016)  . Osteoporosis   . Peripheral vascular disease ([place])    nonviable tissue Right foot  . PONV (postoperative nausea and vomiting)   . Squamous cell carcinoma of skin of right calf Aug. 2015  .  Stroke ([place])    TIA's no residual  . TIA (transient ischemic attack)    "several at once; none in a long time" (09/05/2016)    Past Surgical History:  Procedure Laterality Date  . ABDOMINAL AORTAGRAM N/A 12/26/2014   Procedure: ABDOMINAL Maxcine Ham;  Surgeon: Serafina Mitchell, MD;  Location: [facility] CATH LAB;  Service: Cardiovascular;  Laterality: N/A;  . AMPUTATION Right 12/23/2016   Procedure: AMPUTATION BELOW KNEE;  Surgeon: Serafina Mitchell, MD;  Location: [place] [facility];  Service: Vascular;  Laterality: Right;  . AORTOGRAM N/A 09/26/2016   Procedure: AORTOGRAM;  Surgeon: Waynetta Sandy, MD;  Location: [place];  Service: Vascular;  Laterality: N/A;  . CARPAL TUNNEL RELEASE Right   . CATARACT EXTRACTION W/ INTRAOCULAR LENS  IMPLANT, BILATERAL Bilateral   . COLONOSCOPY    . DILATION AND CURETTAGE OF UTERUS    . DRESSING CHANGE UNDER ANESTHESIA Right 01/16/2017   Procedure: DRESSING CHANGE RIGHT BELOW KNEE AMPUTATION;  Surgeon: Serafina Mitchell, MD;  Location: [place];  Service: Vascular;  Laterality: Right;  . EYE SURGERY Right    "macular OR"  . FEMORAL ARTERY STENT  12-11-10   Left SFA  . FEMORAL-POPLITEAL BYPASS GRAFT Left 09/24/2016   Procedure: REDO FEMORAL TO POPLITEAL ARTERY BYPASS  GRAFT USING 6MM PROPATEN RINGED GORTEX GRAFT;  Surgeon: Serafina Mitchell, MD;  Location: [place];  Service: Vascular;  Laterality: Left;  . FEMORAL-TIBIAL BYPASS GRAFT Left 01/16/2017   Procedure: REDO BYPASS GRAFT FEMORAL-TIBIAL ARTERY WITH GORTEX GRAFT;  Surgeon: Serafina Mitchell, MD;  Location: [facility];  Service: Vascular;  Laterality: Left;  AND LOWER LEG   . I&D EXTREMITY Right 12/17/2016   Procedure: IRRIGATION AND DEBRIDEMENT RIGHT FOOT;  Surgeon: Serafina Mitchell, MD;  Location: [place];  Service: Vascular;  Laterality: Right;  . INCISION AND DRAINAGE OF WOUND Left 10/25/2009   leg/notes 11/13/2009  . INSERTION OF ILIAC STENT Left 12/26/2014   Procedure: INSERTION OF ILIAC STENT;  Surgeon: Serafina Mitchell, MD;  Location:  [facility] CATH LAB;  Service: Cardiovascular;  Laterality: Left;  . INSERTION OF ILIAC STENT Left 09/26/2016   Procedure: SUB INTIMAL INSERTION OF SUPERFICIAL FEMORAL ARTERY AND BELOW KNEE BYPASS GRAFT;  Surgeon: Waynetta Sandy, MD;  Location: [place];  Service: Vascular;  Laterality: Left;  . JOINT REPLACEMENT     knee  . JOINT REPLACEMENT Left Oct. 17, 2014   Elbow ( pt fell 08-31-13 )  . ORIF SHOULDER FRACTURE Right    "it was crushed"  . PERIPHERAL VASCULAR CATHETERIZATION N/A 10/30/2015   Procedure: Abdominal Aortogram w/Lower Extremity;  Surgeon: Serafina Mitchell, MD;  Location: [place] CV LAB;  Service: Cardiovascular;  Laterality: N/A;  . PERIPHERAL VASCULAR CATHETERIZATION  10/30/2015   Procedure: Peripheral Vascular Intervention;  Surgeon: Serafina Mitchell, MD;  Location: [place] CV LAB;  Service: Cardiovascular;;  . PERIPHERAL VASCULAR CATHETERIZATION N/A 04/01/2016   Procedure: Abdominal Aortogram w/Lower Extremity;  Surgeon: Serafina Mitchell, MD;  Location: [place] CV LAB;  Service: Cardiovascular;  Laterality: N/A;  . PERIPHERAL VASCULAR CATHETERIZATION Left 04/01/2016   Procedure: Peripheral Vascular Atherectomy;  Surgeon: Serafina Mitchell, MD;  Location: [place] CV LAB;  Service: Cardiovascular;  Laterality: Left;  Superficial femoral artery.  Marland Kitchen PERIPHERAL VASCULAR CATHETERIZATION Right 09/05/2016   "stent"  . PERIPHERAL VASCULAR CATHETERIZATION N/A 09/05/2016   Procedure: Abdominal Aortogram w/Lower Extremity;  Surgeon: Serafina Mitchell, MD;  Location: [place] CV LAB;  Service: Cardiovascular;  Laterality: N/A;  . PERIPHERAL VASCULAR CATHETERIZATION Right 09/05/2016   Procedure: Peripheral Vascular Intervention;  Surgeon: Serafina Mitchell, MD;  Location: [place] CV LAB;  Service: Cardiovascular;  Laterality: Right;  Superficial Femoral  . PERIPHERAL VASCULAR CATHETERIZATION Left 09/09/2016   Procedure: Lower Extremity Angiography;  Surgeon: Serafina Mitchell,  MD;  Location: [place] CV LAB;  Service: Cardiovascular;  Laterality: Left;  . PR VEIN BYPASS GRAFT,AORTO-FEM-POP  09-13-09   Left Fem-pop  . THROMBECTOMY FEMORAL ARTERY Right 09/26/2016   Procedure: Thromboembolectomy Right Lower Extremity, Right Femoral Artery Endarterectomy with Patch Angioplasty; Right Lower Extremity Angiogram ;  Surgeon: Waynetta Sandy, MD;  Location: [place];  Service: Vascular;  Laterality: Right;  . TOTAL ELBOW ARTHROPLASTY Left 09/03/2013   Procedure: LEFT TOTAL ELBOW ARTHROPLASTY;  Surgeon: Roseanne Kaufman, MD;  Location: [facility];  Service: Orthopedics;  Laterality: Left;  . TOTAL KNEE ARTHROPLASTY Left 06/2006  . TRANSMETATARSAL AMPUTATION Right 12/11/2016   Procedure: TRANSMETATARSAL AMPUTATION;  Surgeon: Serafina Mitchell, MD;  Location: [facility];  Service: Vascular;  Laterality: Right;  . TUBAL LIGATION    . VAGINAL HYSTERECTOMY      There were no vitals filed for this visit.  Subjective Assessment - 10/13/17 1230    Subjective  Daughter returned platform to [facility] [place]. They did not have one that did not have brackets on internal portion of RW. She tried [employer] also who does not have one in stock but can order one to be here in ~2 weeks.     Patient is accompained by:  Family member    Pertinent History  Right TTA 12/23/16, left foot drop, Left bypass graft Oct 2010 & 2018, arthritis, A-fib, DVT, fall from stetps fx pelvis, left hip & left elbow, fx right shoulder, fibromyalgia, HTN, macular degeneration blind right eye, osteoporosis, PVD, CVA/TIA,     Limitations  Lifting;Standing;Walking;House hold activities    Patient Stated Goals  To use prosthesis to get around her house and get into car to go places like church.     Currently in Pain?  Yes    Pain Score  5     Pain Location  Leg    Pain Orientation  Left    Pain Descriptors / Indicators  Aching    Pain Type  Chronic pain    Pain Onset  More than a month ago    Pain  Frequency  Constant    Aggravating Factors   arthritis, wearing AFO, hanging leg down    Pain Relieving Factors  medication                      [facility] Adult PT Treatment/Exercise - 10/13/17 1230      Transfers   Transfers  Sit to Stand;Stand to Sit    Sit to Stand  4: Min assist;4: Min guard;With upper extremity assist;With armrests;From chair/3-in-1 to RW platform    Sit to Stand Details  Tactile cues for placement;Verbal cues for sequencing;Verbal cues for technique;Verbal cues for precautions/safety;Verbal cues for safe use of DME/AE;Manual facilitation for weight bearing;Manual facilitation for weight shifting    Stand to Sit  4: Min guard;5: Supervision;With upper extremity assist;With armrests;To chair/3-in-1 from RW left platform    Stand to Sit Details (indicate cue type and reason)  Verbal cues for sequencing;Verbal cues for technique;Verbal cues for safe use of DME/AE;Manual facilitation for weight shifting;Verbal cues for precautions/safety      Ambulation/Gait   Ambulation/Gait  Yes    Ambulation/Gait Assistance  4: Min assist    Ambulation/Gait Assistance Details  Manual cues for left lateral hip stability to minimize Trendelenburg, verbal cues to decrease adduction / scissoring, step length in turns.     Ambulation Distance (Feet)  65 Feet 65' X 2    Assistive device  Left platform walker;Prosthesis;Other (Comment) AFO    Gait Pattern  Decreased step length - left;Decreased stance time - left;Decreased stride length;Decreased hip/knee flexion - left;Decreased dorsiflexion - left;Decreased weight shift to left;Left hip hike;Left steppage;Right flexed knee in stance;Left flexed knee in stance;Antalgic;Lateral trunk lean to right;Trunk flexed;Poor foot clearance - left;Step-to pattern    Ambulation Surface  Indoor;Level      Prosthetics   Prosthetic Care Comments   Donning liner with PT maintaining initial contact with limb and pt rolling liner; engaged prosthesis  pin into lock with verbal cues & tactile cues. Pt wearing 3 seperate focus points: 1. RLE liner work to tolerate most of awake hours so can donne prosthesis quicker for function. Try liner & socks only 4hrs 2x/day for 4-5 days if no skin issues, then increase by 1hr.   2. prosthesis wear: attempt 3 hrs 2x/day.    3. AFO wear: attempt 3hrs 2x/day with footie  sock & lower leg sock under AFO. Leave T-strap loose unless standing or walking / weight bearing on LLE.     Current prosthetic wear tolerance (days/week)   daily    Current prosthetic wear tolerance (#hours/day)   see prosthetic care comments.     Residual limb condition   RLE with bruising & tenderness    Education Provided  Residual limb care;Proper wear schedule/adjustment;Proper weight-bearing schedule/adjustment;Proper Donning;Other (comment) see prosthetic care comments    Donning Prosthesis  Moderate assist donned liner with PT maintaining distal limb contact, engage               PT Short Term Goals - 10/07/17 1148      PT SHORT TERM GOAL #1   Title  Patient & dtr demonstrate proper donning of prosthesis & AFO. (All STGs Target Date 10/07/2017)    Baseline  MET 10/06/2017    Time  4    Period  Weeks    Status  Achieved      PT SHORT TERM GOAL #2   Title  Patient tolerates wear of prosthesis & AFO >5 hrs total/day with skin issues.     Baseline  Partially MET 10/06/2017  She tolerates wear of prosthetic liner >8 hrs total /day, prosthesis >5hrs total & AFO 2-4hrs total/day.     Time  4    Period  Weeks    Status  Partially Met      PT SHORT TERM GOAL #3   Title  Patient sit to/from stand w/c to RW with minA.    Baseline  MET 10/06/2017    Time  4    Period  Weeks    Status  Achieved      PT SHORT TERM GOAL #4   Title  Patient able to stand with RW support with prosthesis & RW for 2 minutes with supervision.     Baseline  MET 10/06/2017    Time  4    Period  Weeks    Status  Achieved      PT SHORT TERM GOAL #5    Title  Patient ambulates 68' with RW, prosthesis & AFO with modA.    Baseline  MET 10/06/2017    Time  4    Period  Weeks    Status  Achieved        PT Long Term Goals - 09/16/17 2011      PT LONG TERM GOAL #1   Title  Patient's daughter & patient verbalize proper prosthetic care. (All LTGs Target Date 11/06/2017)    Time  8    Period  Weeks    Status  New    Target Date  11/06/17      PT LONG TERM GOAL #2   Title  Patient tolerates wear of TTA prosthesis & AFO >50% of awake hours without skin issues.     Time  8    Period  Weeks    Status  On-going    Target Date  11/06/17      PT LONG TERM GOAL #3   Title  Patient performs sit to/from stand transfers chairs with armrests to RW with supervision.     Time  8    Period  Weeks    Status  On-going    Target Date  11/06/17      PT LONG TERM GOAL #4   Title  Patient standing balance with prosthesis, AFO & RW reaching 5" anterior to RW, scanning  environment with head turns and managing pants for toileting with minA.     Time  8    Period  Weeks    Status  On-going    Target Date  11/06/17      PT LONG TERM GOAL #5   Title  Patient ambulates 71' with RW, prosthesis & AFO with minA.    Time  8    Period  Weeks    Status  On-going    Target Date  11/06/17            Plan - 10/13/17 2155    Clinical Impression Statement  Patient improved gait with less scissoring with manual & verbal cues to minimize Trendelenburg. Patient may be able to donne liner with inverting liner onto cylinder. Having the prosthesis on her limb should decrase fall risk & less stress on other extremiities with transfers.     Rehab Potential  Good    Clinical Impairments Affecting Rehab Potential  Right TTA 12/23/16, left foot drop, L bypass graft Oct 2010, arthritis, A-fib, DVT, Blood clot LLE foot drop, R TTA, fall from stetps fx pelvis, left hip & left elbow, fibromyalgia, HTN, macular degeneration blind right eye, osteoporosis, PVD, CVA, TIA,  right elbow replacement, chronic LBP,     PT Frequency  2x / week    PT Duration  8 weeks    PT Treatment/Interventions  ADLs/Self Care Home Management;Moist Heat;DME Instruction;Gait training;Stair training;Functional mobility training;Therapeutic activities;Therapeutic exercise;Balance training;Neuromuscular re-education;Patient/family education;Orthotic Fit/Training;Prosthetic Training;Vestibular    PT Next Visit Plan  prosthetist scheduled for alignment at next PT visit, work on donning liner with cylinder, review prosthetic & orthotic care, prosthetic gait with left platform RW, car transfers with prosthesis/AFO donne    Consulted and Agree with Plan of Care  Patient;Family member/caregiver    Family Member Consulted  dtr       Patient will benefit from skilled therapeutic intervention in order to improve the following deficits and impairments:  Abnormal gait, Cardiopulmonary status limiting activity, Decreased activity tolerance, Decreased balance, Decreased endurance, Decreased knowledge of use of DME, Decreased range of motion, Decreased mobility, Decreased skin integrity, Decreased strength, Difficulty walking, Increased edema, Impaired flexibility, Postural dysfunction, Pain, Prosthetic Dependency, Dizziness  Visit Diagnosis: Muscle weakness (generalized)  Unsteadiness on feet  Other abnormalities of gait and mobility  Pain in left leg  Pain in right leg  Contracture of muscle, multiple sites     Problem List Patient Active Problem List   Diagnosis Date Noted  . Plantar fasciitis of left foot 06/10/2017  . Idiopathic chronic venous hypertension of both lower extremities with inflammation 05/21/2017  . Foot drop, left 05/21/2017  . Decubitus ulcer of sacral region, unstageable ([place])   . Deep tissue injury   . Hypokalemia   . Hypoalbuminemia due to protein-calorie malnutrition ([place])   . Debilitated 01/21/2017  . Neuropathic pain   . Post-operative pain   . Slow  transit constipation   . Debility   . Ischemic leg   . PAF (paroxysmal atrial fibrillation) ([place])   . Anemia of chronic disease   . Chronic pain syndrome   . Fibromyalgia   . Stage 3 chronic kidney disease ([place])   . Benign essential HTN   . Abnormal urinalysis   . PVD (peripheral vascular disease) ([place]) 01/13/2017  . Post-op pain 01/03/2017  . Phantom limb pain ([place]) 01/03/2017  . S/P unilateral BKA (below knee amputation), right ([place]) 12/30/2016  . Acute on chronic combined systolic and  diastolic CHF (congestive heart failure) ([place]) 12/22/2016  . Anemia 12/22/2016  . Palliative care encounter   . Lower extremity pain, right   . Acute GI bleeding 10/23/2016  . Paroxysmal atrial fibrillation ([place]) 10/03/2016  . Atherosclerosis of autologous vein bypass graft of left lower extremity with rest pain ([place]) 09/24/2016  . PAD (peripheral artery disease) ([place]) 09/05/2016  . Groin pain 04/02/2015  . Aftercare following surgery of the circulatory system, [place] 12/13/2013  . Shingles 10/26/2013  . Anxiety 10/26/2013  . Acute blood loss anemia 09/05/2013  . Elbow fracture, left 09/05/2013  . Left acetabular fracture ([place]) 09/05/2013  . Ankle fracture, left 09/05/2013  . HTN (hypertension) 09/03/2013  . HLD (hyperlipidemia) 09/03/2013  . Peripheral vascular disease, unspecified ([place]) 05/10/2012  . Chronic total occlusion of artery of the extremities ([place]) 01/19/2012    Alexius Ellington PT, DPT 10/13/2017, 9:59 PM  [facility] [address] [place], [place], [address] Phone: [phone]   Fax:  [phone]  Name: ANNAJULIA LEWING MRN: [id number] Date of Birth: 08/25/1928

## 2017-10-15 ENCOUNTER — Encounter: Payer: Self-pay | Admitting: Physical Therapy

## 2017-10-15 ENCOUNTER — Ambulatory Visit: Payer: Medicare Other | Admitting: Physical Therapy

## 2017-10-15 DIAGNOSIS — M79605 Pain in left leg: Secondary | ICD-10-CM

## 2017-10-15 DIAGNOSIS — M79604 Pain in right leg: Secondary | ICD-10-CM | POA: Diagnosis not present

## 2017-10-15 DIAGNOSIS — R2689 Other abnormalities of gait and mobility: Secondary | ICD-10-CM | POA: Diagnosis not present

## 2017-10-15 DIAGNOSIS — M6249 Contracture of muscle, multiple sites: Secondary | ICD-10-CM

## 2017-10-15 DIAGNOSIS — M6281 Muscle weakness (generalized): Secondary | ICD-10-CM | POA: Diagnosis not present

## 2017-10-15 DIAGNOSIS — R2681 Unsteadiness on feet: Secondary | ICD-10-CM

## 2017-10-15 NOTE — Therapy (Signed)
[facility] [address] [place], [place], [address] Phone: [phone]   Fax:  [phone]  Physical Therapy Treatment  Patient Details  Name: [name] MRN: [id number] Date of Birth: 01/02/1928 Referring Provider: Alger Simons, MD  (dtr requested use of Dr. Naaman Plummer for referral) [name], NP & [name], MD)   Encounter Date: 10/15/2017  PT End of Session - 10/15/17 1952    Visit Number  8    Number of Visits  17    Date for PT Re-Evaluation  11/06/17    Authorization Type  Medicare G-code & progress note every 10th visit    PT Start Time  1315    PT Stop Time  1410    PT Time Calculation (min)  55 min    Equipment Utilized During Treatment  Gait belt    Activity Tolerance  Patient limited by pain;Patient limited by fatigue;Patient tolerated treatment well    Behavior During Therapy  [facility] for tasks assessed/performed       Past Medical History:  Diagnosis Date  . Anemia   . Anginal pain ([place])   . Arthritis    "qwhere" (09/05/2016)  . Atrial fibrillation ([place]) 09/2016  . Chronic lower back pain   . Complication of anesthesia    "takes a long time for it to wear off; I can hallucinate if I take too much" (09/05/2016)  . DVT (deep venous thrombosis) ([place]) 10/2009  . Fall from steps 08/31/2013   Fx. pelvis, Left Hip, Left Elbow  . Fibromyalgia   . GERD (gastroesophageal reflux disease)    09/22/16- "no too much anymore"  . GI bleed 10/24/2016  . High cholesterol   . History of blood transfusion   . History of hiatal hernia   . Hypertension   . 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 Osteoporosis   . Peripheral vascular disease ([place])    nonviable tissue Right foot  . PONV (postoperative nausea and vomiting)   . Squamous cell carcinoma of skin of right calf Aug. 2015  .  Stroke ([place])    TIA's no residual  . TIA (transient ischemic attack)    "several at once; none in a long time" (09/05/2016)    Past Surgical History:  Procedure Laterality Date  . ABDOMINAL AORTAGRAM N/A 12/26/2014   Procedure: ABDOMINAL Maxcine Ham;  Surgeon: Serafina Mitchell, MD;  Location: [facility] CATH LAB;  Service: Cardiovascular;  Laterality: N/A;  . AMPUTATION Right 12/23/2016   Procedure: AMPUTATION BELOW KNEE;  Surgeon: Serafina Mitchell, MD;  Location: [place];  Service: Vascular;  Laterality: Right;  . AORTOGRAM N/A 09/26/2016   Procedure: AORTOGRAM;  Surgeon: Waynetta Sandy, MD;  Location: [facility];  Service: Vascular;  Laterality: N/A;  . CARPAL TUNNEL RELEASE Right   . CATARACT EXTRACTION W/ INTRAOCULAR LENS  IMPLANT, BILATERAL Bilateral   . COLONOSCOPY    . DILATION AND CURETTAGE OF UTERUS    . DRESSING CHANGE UNDER ANESTHESIA Right 01/16/2017   Procedure: DRESSING CHANGE RIGHT BELOW KNEE AMPUTATION;  Surgeon: Serafina Mitchell, MD;  Location: [facility];  Service: Vascular;  Laterality: Right;  . EYE SURGERY Right    "macular OR"  . FEMORAL ARTERY STENT  12-11-10   Left SFA  . FEMORAL-POPLITEAL BYPASS GRAFT Left 09/24/2016   Procedure: REDO FEMORAL TO POPLITEAL ARTERY BYPASS  GRAFT USING 6MM PROPATEN RINGED GORTEX GRAFT;  Surgeon: Serafina Mitchell, MD;  Location: [facility];  Service: Vascular;  Laterality: Left;  . FEMORAL-TIBIAL BYPASS GRAFT Left 01/16/2017   Procedure: REDO BYPASS GRAFT FEMORAL-TIBIAL ARTERY WITH GORTEX GRAFT;  Surgeon: Serafina Mitchell, MD;  Location: [facility];  Service: Vascular;  Laterality: Left;  AND LOWER LEG   . I&D EXTREMITY Right 12/17/2016   Procedure: IRRIGATION AND DEBRIDEMENT RIGHT FOOT;  Surgeon: Serafina Mitchell, MD;  Location: [place];  Service: Vascular;  Laterality: Right;  . INCISION AND DRAINAGE OF WOUND Left 10/25/2009   leg/notes 11/13/2009  . INSERTION OF ILIAC STENT Left 12/26/2014   Procedure: INSERTION OF ILIAC STENT;  Surgeon: Serafina Mitchell, MD;  Location:  [facility] CATH LAB;  Service: Cardiovascular;  Laterality: Left;  . INSERTION OF ILIAC STENT Left 09/26/2016   Procedure: SUB INTIMAL INSERTION OF SUPERFICIAL FEMORAL ARTERY AND BELOW KNEE BYPASS GRAFT;  Surgeon: Waynetta Sandy, MD;  Location: [place];  Service: Vascular;  Laterality: Left;  . JOINT REPLACEMENT     knee  . JOINT REPLACEMENT Left Oct. 17, 2014   Elbow ( pt fell 08-31-13 )  . ORIF SHOULDER FRACTURE Right    "it was crushed"  . PERIPHERAL VASCULAR CATHETERIZATION N/A 10/30/2015   Procedure: Abdominal Aortogram w/Lower Extremity;  Surgeon: Serafina Mitchell, MD;  Location: [place] CV LAB;  Service: Cardiovascular;  Laterality: N/A;  . PERIPHERAL VASCULAR CATHETERIZATION  10/30/2015   Procedure: Peripheral Vascular Intervention;  Surgeon: Serafina Mitchell, MD;  Location: [place] CV LAB;  Service: Cardiovascular;;  . PERIPHERAL VASCULAR CATHETERIZATION N/A 04/01/2016   Procedure: Abdominal Aortogram w/Lower Extremity;  Surgeon: Serafina Mitchell, MD;  Location: [place] CV LAB;  Service: Cardiovascular;  Laterality: N/A;  . PERIPHERAL VASCULAR CATHETERIZATION Left 04/01/2016   Procedure: Peripheral Vascular Atherectomy;  Surgeon: Serafina Mitchell, MD;  Location: [place] CV LAB;  Service: Cardiovascular;  Laterality: Left;  Superficial femoral artery.  Marland Kitchen PERIPHERAL VASCULAR CATHETERIZATION Right 09/05/2016   "stent"  . PERIPHERAL VASCULAR CATHETERIZATION N/A 09/05/2016   Procedure: Abdominal Aortogram w/Lower Extremity;  Surgeon: Serafina Mitchell, MD;  Location: [place] [facility] CV LAB;  Service: Cardiovascular;  Laterality: N/A;  . PERIPHERAL VASCULAR CATHETERIZATION Right 09/05/2016   Procedure: Peripheral Vascular Intervention;  Surgeon: Serafina Mitchell, MD;  Location: [facility] CV LAB;  Service: Cardiovascular;  Laterality: Right;  Superficial Femoral  . PERIPHERAL VASCULAR CATHETERIZATION Left 09/09/2016   Procedure: Lower Extremity Angiography;  Surgeon: Serafina Mitchell,  MD;  Location: [place] CV LAB;  Service: Cardiovascular;  Laterality: Left;  . PR VEIN BYPASS GRAFT,AORTO-FEM-POP  09-13-09   Left Fem-pop  . THROMBECTOMY FEMORAL ARTERY Right 09/26/2016   Procedure: Thromboembolectomy Right Lower Extremity, Right Femoral Artery Endarterectomy with Patch Angioplasty; Right Lower Extremity Angiogram ;  Surgeon: Waynetta Sandy, MD;  Location: [facility];  Service: Vascular;  Laterality: Right;  . TOTAL ELBOW ARTHROPLASTY Left 09/03/2013   Procedure: LEFT TOTAL ELBOW ARTHROPLASTY;  Surgeon: Roseanne Kaufman, MD;  Location: [facility];  Service: Orthopedics;  Laterality: Left;  . TOTAL KNEE ARTHROPLASTY Left 06/2006  . TRANSMETATARSAL AMPUTATION Right 12/11/2016   Procedure: TRANSMETATARSAL AMPUTATION;  Surgeon: Serafina Mitchell, MD;  Location: [facility];  Service: Vascular;  Laterality: Right;  . TUBAL LIGATION    . VAGINAL HYSTERECTOMY      There were no vitals filed for this visit.  Subjective Assessment - 10/15/17 1315    Subjective  Her limb still hurts when donning & doffing the liner. She wore liner most of awake hours yesterday but it made distal tibia area sore.     Patient is accompained by:  Family member    Pertinent History  Right TTA 12/23/16, left foot drop, Left bypass graft Oct 2010 & 2018, arthritis, A-fib, DVT, fall from stetps fx pelvis, left hip & left elbow, fx right shoulder, fibromyalgia, HTN, macular degeneration blind right eye, osteoporosis, PVD, CVA/TIA,     Limitations  Lifting;Standing;Walking;House hold activities    Patient Stated Goals  To use prosthesis to get around her house and get into car to go places like church.     Currently in Pain?  Yes    Pain Score  5     Pain Location  Leg    Pain Orientation  Right;Left    Pain Descriptors / Indicators  Aching;Sore    Pain Type  Chronic pain    Pain Onset  More than a month ago    Pain Frequency  Constant    Aggravating Factors   arthritis, wearing AFO & prosthetic liner, hanging  leg down    Pain Relieving Factors  medication                      [facility] Adult PT Treatment/Exercise - 10/15/17 1315      Transfers   Transfers  Sit to Stand;Stand to Sit    Sit to Stand  4: Min assist;4: Min guard;With upper extremity assist;With armrests;From chair/3-in-1 to RW platform    Sit to Stand Details  Tactile cues for placement;Verbal cues for sequencing;Verbal cues for technique;Verbal cues for precautions/safety;Verbal cues for safe use of DME/AE;Manual facilitation for weight bearing;Manual facilitation for weight shifting    Stand to Sit  4: Min guard;5: Supervision;With upper extremity assist;With armrests;To chair/3-in-1 from RW left platform    Stand to Sit Details (indicate cue type and reason)  Verbal cues for sequencing;Verbal cues for technique;Verbal cues for safe use of DME/AE;Manual facilitation for weight shifting;Verbal cues for precautions/safety      Ambulation/Gait   Ambulation/Gait  Yes    Ambulation/Gait Assistance  4: Min assist    Ambulation/Gait Assistance Details  manual assist to control Lt Trendelenburg, verbal cues on step width to control scissoring & upright posture.     Ambulation Distance (Feet)  65 Feet 80'    Assistive device  Left platform walker;Prosthesis;Other (Comment) AFO    Gait Pattern  Decreased step length - left;Decreased stance time - left;Decreased stride length;Decreased hip/knee flexion - left;Decreased dorsiflexion - left;Decreased weight shift to left;Left hip hike;Left steppage;Right flexed knee in stance;Left flexed knee in stance;Antalgic;Lateral trunk lean to right;Trunk flexed;Poor foot clearance - left;Step-to pattern    Ambulation Surface  Indoor;Level      Prosthetics   Prosthetic Care Comments   PT attempted donning liner with inverted on cylinder including on 2" & 4" block but patient did not have enough strength to donne independently. PT placed liner against distal limb & maintained contact, pt able to  donne with increased time.     Current prosthetic wear tolerance (days/week)   daily    Current prosthetic wear tolerance (#hours/day)   Pt wearing liner most of awake hours but increasing limb pain, wears prosthesis 3hrs 2x/day and AFO 1-2 hrs 1-2x/day    Residual limb condition   RLE with bruising & tenderness, pitting edema in BLEs with dependent position    Education Provided  Residual limb care;Proper wear schedule/adjustment;Proper weight-bearing schedule/adjustment;Proper Donning;Other (comment) see prosthetic care comments    Person(s) Educated  Patient;Child(ren)    Education Method  Explanation;Demonstration;Tactile cues;Verbal cues    Education Method  Verbalized understanding;Tactile cues required;Verbal cues required;Needs further instruction    Donning Prosthesis  Moderate assist    Doffing Prosthesis  Supervision               PT Short Term Goals - 10/07/17 1148      PT SHORT TERM GOAL #1   Title  Patient & dtr demonstrate proper donning of prosthesis & AFO. (All STGs Target Date 10/07/2017)    Baseline  MET 10/06/2017    Time  4    Period  Weeks    Status  Achieved      PT SHORT TERM GOAL #2   Title  Patient tolerates wear of prosthesis & AFO >5 hrs total/day with skin issues.     Baseline  Partially MET 10/06/2017  She tolerates wear of prosthetic liner >8 hrs total /day, prosthesis >5hrs total & AFO 2-4hrs total/day.     Time  4    Period  Weeks    Status  Partially Met      PT SHORT TERM GOAL #3   Title  Patient sit to/from stand w/c to RW with minA.    Baseline  MET 10/06/2017    Time  4    Period  Weeks    Status  Achieved      PT SHORT TERM GOAL #4   Title  Patient able to stand with RW support with prosthesis & RW for 2 minutes with supervision.     Baseline  MET 10/06/2017    Time  4    Period  Weeks    Status  Achieved      PT SHORT TERM GOAL #5   Title  Patient ambulates 63' with RW, prosthesis & AFO with modA.    Baseline  MET 10/06/2017     Time  4    Period  Weeks    Status  Achieved        PT Long Term Goals - 09/16/17 2011      PT LONG TERM GOAL #1   Title  Patient's daughter & patient verbalize proper prosthetic care. (All LTGs Target Date 11/06/2017)    Time  8    Period  Weeks    Status  New    Target Date  11/06/17      PT LONG TERM GOAL #2   Title  Patient tolerates wear of TTA prosthesis & AFO >50% of awake hours without skin issues.     Time  8    Period  Weeks    Status  On-going    Target Date  11/06/17      PT LONG TERM GOAL #3   Title  Patient performs sit to/from stand transfers chairs with armrests to RW with supervision.     Time  8    Period  Weeks    Status  On-going    Target Date  11/06/17      PT LONG TERM GOAL #4   Title  Patient standing balance with prosthesis, AFO & RW reaching 5" anterior to RW, scanning environment with head turns and managing pants for toileting with minA.     Time  8    Period  Weeks    Status  On-going    Target Date  11/06/17  PT LONG TERM GOAL #5   Title  Patient ambulates 19' with RW, prosthesis & AFO with minA.    Time  8    Period  Weeks    Status  On-going    Target Date  11/06/17            Plan - 10/15/17 1953    Clinical Impression Statement  CPO present at today's session for alignment check & discussion of possible modifications to AFO. CPO to get diabetic shoes with stretch material and took custom mold for foot orthosis to improve weight distribution in standing with building up medial aspect to connect fixed contracture with ground.     Rehab Potential  Good    Clinical Impairments Affecting Rehab Potential  Right TTA 12/23/16, left foot drop, L bypass graft Oct 2010, arthritis, A-fib, DVT, Blood clot LLE foot drop, R TTA, fall from stetps fx pelvis, left hip & left elbow, fibromyalgia, HTN, macular degeneration blind right eye, osteoporosis, PVD, CVA, TIA, right elbow replacement, chronic LBP,     PT Frequency  2x / week    PT  Duration  8 weeks    PT Treatment/Interventions  ADLs/Self Care Home Management;Moist Heat;DME Instruction;Gait training;Stair training;Functional mobility training;Therapeutic activities;Therapeutic exercise;Balance training;Neuromuscular re-education;Patient/family education;Orthotic Fit/Training;Prosthetic Training;Vestibular    PT Next Visit Plan  review prosthetic & orthotic care, prosthetic gait with left platform RW, car transfers with prosthesis/AFO donne    Consulted and Agree with Plan of Care  Patient;Family member/caregiver    Family Member Consulted  dtr       Patient will benefit from skilled therapeutic intervention in order to improve the following deficits and impairments:  Abnormal gait, Cardiopulmonary status limiting activity, Decreased activity tolerance, Decreased balance, Decreased endurance, Decreased knowledge of use of DME, Decreased range of motion, Decreased mobility, Decreased skin integrity, Decreased strength, Difficulty walking, Increased edema, Impaired flexibility, Postural dysfunction, Pain, Prosthetic Dependency, Dizziness  Visit Diagnosis: Muscle weakness (generalized)  Unsteadiness on feet  Other abnormalities of gait and mobility  Pain in left leg  Pain in right leg  Contracture of muscle, multiple sites     Problem List Patient Active Problem List   Diagnosis Date Noted  . Plantar fasciitis of left foot 06/10/2017  . Idiopathic chronic venous hypertension of both lower extremities with inflammation 05/21/2017  . Foot drop, left 05/21/2017  . Decubitus ulcer of sacral region, unstageable ([place])   . Deep tissue injury   . Hypokalemia   . Hypoalbuminemia due to protein-calorie malnutrition ([place])   . Debilitated 01/21/2017  . Neuropathic pain   . Post-operative pain   . Slow transit constipation   . Debility   . Ischemic leg   . PAF (paroxysmal atrial fibrillation) ([place])   . Anemia of chronic disease   . Chronic pain syndrome   .  Fibromyalgia   . Stage 3 chronic kidney disease ([place])   . Benign essential HTN   . Abnormal urinalysis   . PVD (peripheral vascular disease) ([place]) 01/13/2017  . Post-op pain 01/03/2017  . Phantom limb pain ([place]) 01/03/2017  . S/P unilateral BKA (below knee amputation), right ([place]) 12/30/2016  . Acute on chronic combined systolic and diastolic CHF (congestive heart failure) ([place]) 12/22/2016  . Anemia 12/22/2016  . Palliative care encounter   . Lower extremity pain, right   . Acute GI bleeding 10/23/2016  . Paroxysmal atrial fibrillation ([place]) 10/03/2016  . Atherosclerosis of autologous vein bypass graft of left lower extremity with rest pain ([place])  09/24/2016  . PAD (peripheral artery disease) ([place]) 09/05/2016  . Groin pain 04/02/2015  . Aftercare following surgery of the circulatory system, [place] 12/13/2013  . Shingles 10/26/2013  . Anxiety 10/26/2013  . Acute blood loss anemia 09/05/2013  . Elbow fracture, left 09/05/2013  . Left acetabular fracture ([place]) 09/05/2013  . Ankle fracture, left 09/05/2013  . HTN (hypertension) 09/03/2013  . HLD (hyperlipidemia) 09/03/2013  . Peripheral vascular disease, unspecified ([place]) 05/10/2012  . Chronic total occlusion of artery of the extremities ([place]) 01/19/2012    Ommie Degeorge PT, DPT 10/15/2017, 7:57 PM  [facility] [address] [place], [place], [address] Phone: [phone]   Fax:  [phone]  Name: [name] MRN: [id number] Date of Birth: 06/27/1928

## 2017-10-16 IMAGING — CR DG ANKLE COMPLETE 3+V*L*
3 series · 3 of 3 positions shown · non-contrast
Comparison: 01/13/2017

CLINICAL DATA: Injury to the left foot and ankle 02/17/2017.
Swelling and lateral pain. Initial encounter.

EXAM:
LEFT ANKLE COMPLETE - 3+ VIEW

[t ankle joint ap left]
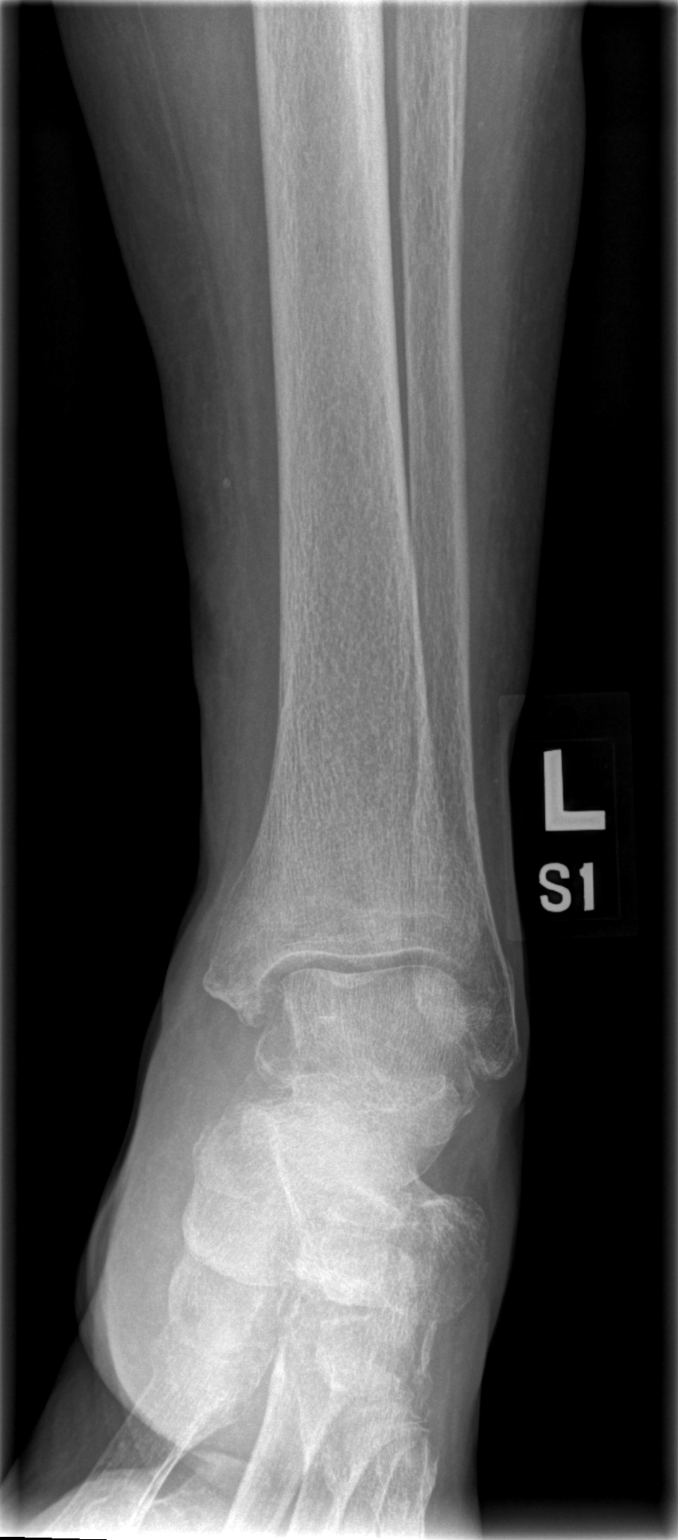

[t ankle joint oblique left]
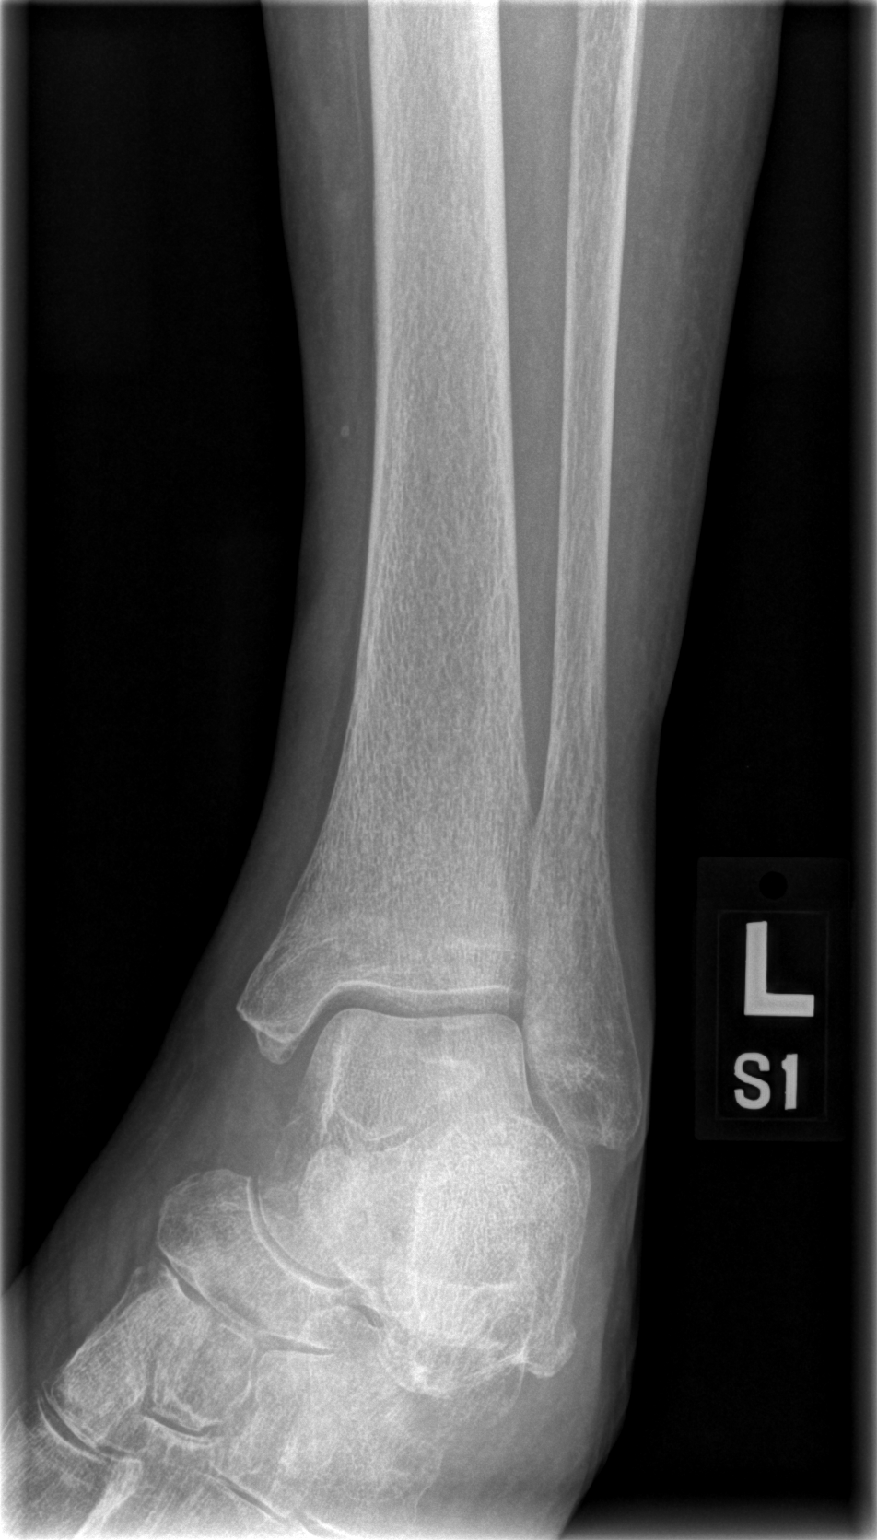

[t ankle joint lat left]
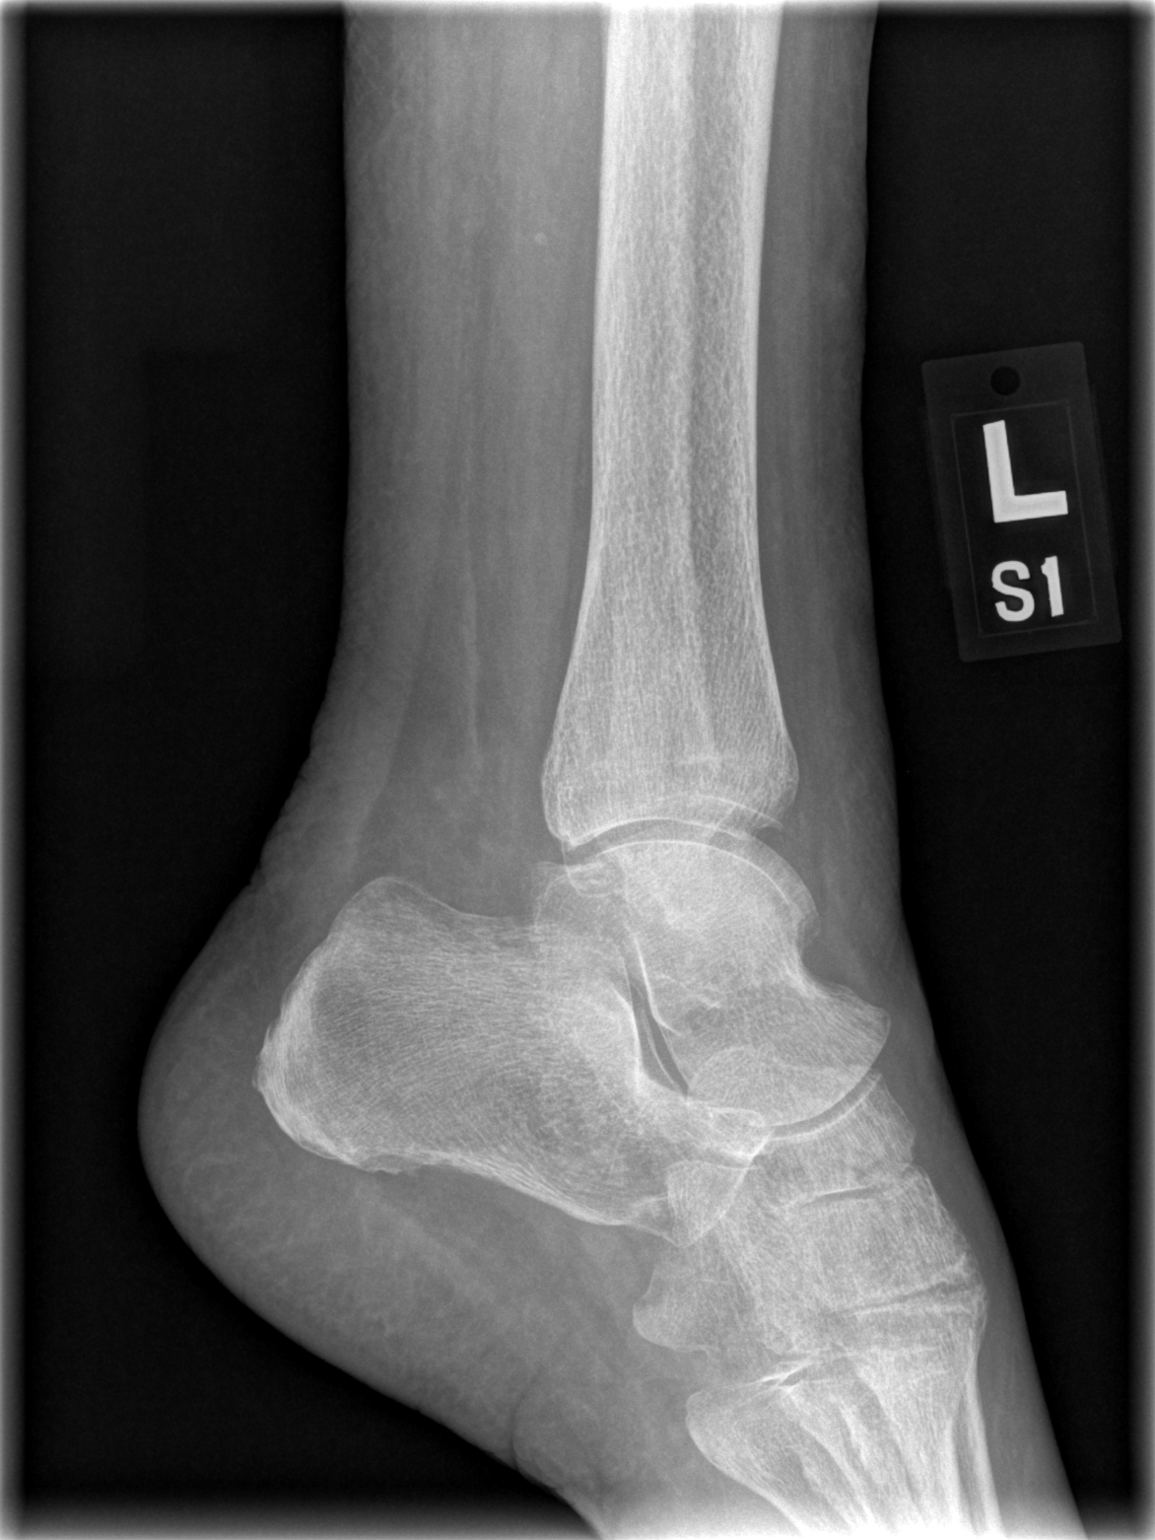

[3 of 3 positions shown; findings below may reference images not displayed]

FINDINGS: Prominent osteopenia, progressed from prior. No evidence of acute
fracture or malalignment. No sign of ankle joint effusion. Mild
generalized and nonspecific soft tissue reticulation.
IMPRESSION: 1. No acute finding.
2. Prominent osteopenia, progressed from December 2016.

## 2017-10-20 ENCOUNTER — Encounter: Payer: Self-pay | Admitting: Physical Therapy

## 2017-10-20 ENCOUNTER — Ambulatory Visit: Payer: Medicare Other | Attending: Family | Admitting: Physical Therapy

## 2017-10-20 DIAGNOSIS — M79604 Pain in right leg: Secondary | ICD-10-CM | POA: Diagnosis not present

## 2017-10-20 DIAGNOSIS — M6249 Contracture of muscle, multiple sites: Secondary | ICD-10-CM

## 2017-10-20 DIAGNOSIS — M6281 Muscle weakness (generalized): Secondary | ICD-10-CM | POA: Diagnosis not present

## 2017-10-20 DIAGNOSIS — R2689 Other abnormalities of gait and mobility: Secondary | ICD-10-CM | POA: Insufficient documentation

## 2017-10-20 DIAGNOSIS — M79605 Pain in left leg: Secondary | ICD-10-CM | POA: Diagnosis not present

## 2017-10-20 DIAGNOSIS — R2681 Unsteadiness on feet: Secondary | ICD-10-CM | POA: Diagnosis not present

## 2017-10-20 NOTE — Therapy (Signed)
[facility] [address] [place], [place], [address] Phone: [phone]   Fax:  [phone]  Physical Therapy Treatment  Patient Details  Name: [name] MRN: [id number] Date of Birth: [birth year] Referring Provider: Alger Simons, MD  (dtr requested use of Dr. Naaman Plummer for referral) [name], NP & [name], MD)   Encounter Date: 10/20/2017  PT End of Session - 10/20/17 1239    Visit Number  9    Number of Visits  17    Date for PT Re-Evaluation  11/06/17    Authorization Type  Medicare G-code & progress note every 10th visit    PT Start Time  0160    PT Stop Time  1315    PT Time Calculation (min)  40 min    Equipment Utilized During Treatment  Gait belt    Activity Tolerance  Patient limited by pain;Patient limited by fatigue;Patient tolerated treatment well    Behavior During Therapy  [facility] for tasks assessed/performed       Past Medical History:  Diagnosis Date  . Anemia   . Anginal pain ([place])   . Arthritis    "qwhere" (09/05/2016)  . Atrial fibrillation ([place]) 09/2016  . Chronic lower back pain   . Complication of anesthesia    "takes a long time for it to wear off; I can hallucinate if I take too much" (09/05/2016)  . DVT (deep venous thrombosis) ([place]) 10/2009  . Fall from steps 08/31/2013   Fx. pelvis, Left Hip, Left Elbow  . Fibromyalgia   . GERD (gastroesophageal reflux disease)    09/22/16- "no too much anymore"  . GI bleed 10/24/2016  . High cholesterol   . History of blood transfusion   . History of hiatal hernia   . Hypertension   . Macular degeneration, wet ([place])    "started in right eye; now legally blind in that eye; now started in left eye but pretty much in control" (09/05/2016)  . Osteoporosis   . Peripheral vascular disease ([place])    nonviable tissue Right foot  . PONV (postoperative nausea and vomiting)   . Squamous cell carcinoma of skin of right calf Aug. 2015  .  Stroke ([place])    TIA's no residual  . TIA (transient ischemic attack)    "several at once; none in a long time" (09/05/2016)    Past Surgical History:  Procedure Laterality Date  . ABDOMINAL AORTAGRAM N/A 12/26/2014   Procedure: ABDOMINAL Maxcine Ham;  Surgeon: Serafina Mitchell, MD;  Location: [facility] CATH LAB;  Service: Cardiovascular;  Laterality: N/A;  . AMPUTATION Right 12/23/2016   Procedure: AMPUTATION BELOW KNEE;  Surgeon: Serafina Mitchell, MD;  Location: [facility];  Service: Vascular;  Laterality: Right;  . AORTOGRAM N/A 09/26/2016   Procedure: AORTOGRAM;  Surgeon: Waynetta Sandy, MD;  Location: [facility];  Service: Vascular;  Laterality: N/A;  . CARPAL TUNNEL RELEASE Right   . CATARACT EXTRACTION W/ INTRAOCULAR LENS  IMPLANT, BILATERAL Bilateral   . COLONOSCOPY    . DILATION AND CURETTAGE OF UTERUS    . DRESSING CHANGE UNDER ANESTHESIA Right 01/16/2017   Procedure: DRESSING CHANGE RIGHT BELOW KNEE AMPUTATION;  Surgeon: Serafina Mitchell, MD;  Location: [place];  Service: Vascular;  Laterality: Right;  . EYE SURGERY Right    "macular OR"  . FEMORAL ARTERY STENT  12-11-10   Left SFA  . FEMORAL-POPLITEAL BYPASS GRAFT Left 09/24/2016   Procedure: REDO FEMORAL TO POPLITEAL ARTERY BYPASS  GRAFT USING 6MM PROPATEN RINGED GORTEX GRAFT;  Surgeon: Serafina Mitchell, MD;  Location: [facility];  Service: Vascular;  Laterality: Left;  . FEMORAL-TIBIAL BYPASS GRAFT Left 01/16/2017   Procedure: REDO BYPASS GRAFT FEMORAL-TIBIAL ARTERY WITH GORTEX GRAFT;  Surgeon: Serafina Mitchell, MD;  Location: [place] [facility];  Service: Vascular;  Laterality: Left;  AND LOWER LEG   . I&D EXTREMITY Right 12/17/2016   Procedure: IRRIGATION AND DEBRIDEMENT RIGHT FOOT;  Surgeon: Serafina Mitchell, MD;  Location: [place];  Service: Vascular;  Laterality: Right;  . INCISION AND DRAINAGE OF WOUND Left 10/25/2009   leg/notes 11/13/2009  . INSERTION OF ILIAC STENT Left 12/26/2014   Procedure: INSERTION OF ILIAC STENT;  Surgeon: Serafina Mitchell, MD;  Location:  [facility] CATH LAB;  Service: Cardiovascular;  Laterality: Left;  . INSERTION OF ILIAC STENT Left 09/26/2016   Procedure: SUB INTIMAL INSERTION OF SUPERFICIAL FEMORAL ARTERY AND BELOW KNEE BYPASS GRAFT;  Surgeon: Waynetta Sandy, MD;  Location: [place];  Service: Vascular;  Laterality: Left;  . JOINT REPLACEMENT     knee  . JOINT REPLACEMENT Left Oct. 17, 2014   Elbow ( pt fell 08-31-13 )  . ORIF SHOULDER FRACTURE Right    "it was crushed"  . PERIPHERAL VASCULAR CATHETERIZATION N/A 10/30/2015   Procedure: Abdominal Aortogram w/Lower Extremity;  Surgeon: Serafina Mitchell, MD;  Location: [facility] [facility] CV LAB;  Service: Cardiovascular;  Laterality: N/A;  . PERIPHERAL VASCULAR CATHETERIZATION  10/30/2015   Procedure: Peripheral Vascular Intervention;  Surgeon: Serafina Mitchell, MD;  Location: [facility] CV LAB;  Service: Cardiovascular;;  . PERIPHERAL VASCULAR CATHETERIZATION N/A 04/01/2016   Procedure: Abdominal Aortogram w/Lower Extremity;  Surgeon: Serafina Mitchell, MD;  Location: [place] CV LAB;  Service: Cardiovascular;  Laterality: N/A;  . PERIPHERAL VASCULAR CATHETERIZATION Left 04/01/2016   Procedure: Peripheral Vascular Atherectomy;  Surgeon: Serafina Mitchell, MD;  Location: [facility] CV LAB;  Service: Cardiovascular;  Laterality: Left;  Superficial femoral artery.  Marland Kitchen PERIPHERAL VASCULAR CATHETERIZATION Right 09/05/2016   "stent"  . PERIPHERAL VASCULAR CATHETERIZATION N/A 09/05/2016   Procedure: Abdominal Aortogram w/Lower Extremity;  Surgeon: Serafina Mitchell, MD;  Location: [place] CV LAB;  Service: Cardiovascular;  Laterality: N/A;  . PERIPHERAL VASCULAR CATHETERIZATION Right 09/05/2016   Procedure: Peripheral Vascular Intervention;  Surgeon: Serafina Mitchell, MD;  Location: [place] CV LAB;  Service: Cardiovascular;  Laterality: Right;  Superficial Femoral  . PERIPHERAL VASCULAR CATHETERIZATION Left 09/09/2016   Procedure: Lower Extremity Angiography;  Surgeon: Serafina Mitchell,  MD;  Location: [place] CV LAB;  Service: Cardiovascular;  Laterality: Left;  . PR VEIN BYPASS GRAFT,AORTO-FEM-POP  09-13-09   Left Fem-pop  . THROMBECTOMY FEMORAL ARTERY Right 09/26/2016   Procedure: Thromboembolectomy Right Lower Extremity, Right Femoral Artery Endarterectomy with Patch Angioplasty; Right Lower Extremity Angiogram ;  Surgeon: Waynetta Sandy, MD;  Location: [facility];  Service: Vascular;  Laterality: Right;  . TOTAL ELBOW ARTHROPLASTY Left 09/03/2013   Procedure: LEFT TOTAL ELBOW ARTHROPLASTY;  Surgeon: Roseanne Kaufman, MD;  Location: [facility];  Service: Orthopedics;  Laterality: Left;  . TOTAL KNEE ARTHROPLASTY Left 06/2006  . TRANSMETATARSAL AMPUTATION Right 12/11/2016   Procedure: TRANSMETATARSAL AMPUTATION;  Surgeon: Serafina Mitchell, MD;  Location: [facility];  Service: Vascular;  Laterality: Right;  . TUBAL LIGATION    . VAGINAL HYSTERECTOMY      There were no vitals filed for this visit.  Subjective Assessment - 10/20/17 1238    Subjective  No new complaints, no falls to report.     Patient is accompained by:  Family member    Pertinent History  Right TTA 12/23/16, left foot drop, Left bypass graft Oct 2010 & 2018, arthritis, A-fib, DVT, fall from stetps fx pelvis, left hip & left elbow, fx right shoulder, fibromyalgia, HTN, macular degeneration blind right eye, osteoporosis, PVD, CVA/TIA,     Limitations  Lifting;Standing;Walking;House hold activities    Patient Stated Goals  To use prosthesis to get around her house and get into car to go places like church.     Pain Score  5     Pain Location  Leg    Pain Orientation  Right    Pain Descriptors / Indicators  Aching;Sore    Pain Type  Chronic pain    Pain Onset  More than a month ago    Pain Frequency  Constant    Aggravating Factors   arthritis, wearing AFO & prosthetic liner, hanging leg down    Pain Relieving Factors  medication        [facility] Adult PT Treatment/Exercise - 10/20/17 1241      Ambulation/Gait    Ambulation/Gait  Yes    Ambulation/Gait Assistance  4: Min assist    Ambulation/Gait Assistance Details  manual assist to control Lt Trendelenburg, verbal cues on step width around curves to control scissoring & upright posture, manual assistance to advance RW around turns in clinic.     Ambulation Distance (Feet)  90 Feet X1,  40 feet x1   Assistive device  Left platform walker;Prosthesis;Other (Comment) AFO    Gait Pattern  Step-through pattern;Decreased step length - left;Decreased stride length;Decreased weight shift to left    Ambulation Surface  Level;Indoor      Self-Care   Self-Care  Other Self-Care Comments    Other Self-Care Comments   Pt presented with some swelling of RLE, only one sock necessary to apply prosthesis. Therapist applied AFO to LLE, pt presents with increased supination with foot in shoe.        PT Short Term Goals - 10/07/17 1148      PT SHORT TERM GOAL #1   Title  Patient & dtr demonstrate proper donning of prosthesis & AFO. (All STGs Target Date 10/07/2017)    Baseline  MET 10/06/2017    Time  4    Period  Weeks    Status  Achieved      PT SHORT TERM GOAL #2   Title  Patient tolerates wear of prosthesis & AFO >5 hrs total/day with skin issues.     Baseline  Partially MET 10/06/2017  She tolerates wear of prosthetic liner >8 hrs total /day, prosthesis >5hrs total & AFO 2-4hrs total/day.     Time  4    Period  Weeks    Status  Partially Met      PT SHORT TERM GOAL #3   Title  Patient sit to/from stand w/c to RW with minA.    Baseline  MET 10/06/2017    Time  4    Period  Weeks    Status  Achieved      PT SHORT TERM GOAL #4   Title  Patient able to stand with RW support with prosthesis & RW for 2 minutes with supervision.     Baseline  MET 10/06/2017    Time  4    Period  Weeks    Status  Achieved      PT SHORT  TERM GOAL #5   Title  Patient ambulates 66' with RW, prosthesis & AFO with modA.    Baseline  MET 10/06/2017    Time  4    Period   Weeks    Status  Achieved        PT Long Term Goals - 09/16/17 2011      PT LONG TERM GOAL #1   Title  Patient's daughter & patient verbalize proper prosthetic care. (All LTGs Target Date 11/06/2017)    Time  8    Period  Weeks    Status  New    Target Date  11/06/17      PT LONG TERM GOAL #2   Title  Patient tolerates wear of TTA prosthesis & AFO >50% of awake hours without skin issues.     Time  8    Period  Weeks    Status  On-going    Target Date  11/06/17      PT LONG TERM GOAL #3   Title  Patient performs sit to/from stand transfers chairs with armrests to RW with supervision.     Time  8    Period  Weeks    Status  On-going    Target Date  11/06/17      PT LONG TERM GOAL #4   Title  Patient standing balance with prosthesis, AFO & RW reaching 5" anterior to RW, scanning environment with head turns and managing pants for toileting with minA.     Time  8    Period  Weeks    Status  On-going    Target Date  11/06/17      PT LONG TERM GOAL #5   Title  Patient ambulates 22' with RW, prosthesis & AFO with minA.    Time  8    Period  Weeks    Status  On-going    Target Date  11/06/17        Plan - 10/20/17 1644    Clinical Impression Statement  Pt tolerated treatment well with no limitations due to pain but some due to fatigue. Todays session focused on Gait training with prosthesis and self care. Pt has increased ambulation distance and would benefit from continued PT sessions.     Rehab Potential  Good    Clinical Impairments Affecting Rehab Potential  Right TTA 12/23/16, left foot drop, L bypass graft Oct 2010, arthritis, A-fib, DVT, Blood clot LLE foot drop, R TTA, fall from stetps fx pelvis, left hip & left elbow, fibromyalgia, HTN, macular degeneration blind right eye, osteoporosis, PVD, CVA, TIA, right elbow replacement, chronic LBP,     PT Frequency  2x / week    PT Duration  8 weeks    PT Treatment/Interventions  ADLs/Self Care Home Management;Moist Heat;DME  Instruction;Gait training;Stair training;Functional mobility training;Therapeutic activities;Therapeutic exercise;Balance training;Neuromuscular re-education;Patient/family education;Orthotic Fit/Training;Prosthetic Training;Vestibular    PT Next Visit Plan  G-code next visit; review prosthetic & orthotic care, continue prosthetic gait with left platform RW, car transfers with prosthesis/AFO donne    Consulted and Agree with Plan of Care  Patient;Family member/caregiver    Family Member Consulted  dtr       Patient will benefit from skilled therapeutic intervention in order to improve the following deficits and impairments:  Abnormal gait, Cardiopulmonary status limiting activity, Decreased activity tolerance, Decreased balance, Decreased endurance, Decreased knowledge of use of DME, Decreased range of motion, Decreased mobility, Decreased skin integrity, Decreased strength, Difficulty walking, Increased edema, Impaired  flexibility, Postural dysfunction, Pain, Prosthetic Dependency, Dizziness  Visit Diagnosis: Muscle weakness (generalized)  Unsteadiness on feet  Other abnormalities of gait and mobility  Pain in left leg  Pain in right leg  Contracture of muscle, multiple sites     Problem List Patient Active Problem List   Diagnosis Date Noted  . Plantar fasciitis of left foot 06/10/2017  . Idiopathic chronic venous hypertension of both lower extremities with inflammation 05/21/2017  . Foot drop, left 05/21/2017  . Decubitus ulcer of sacral region, unstageable ([place])   . Deep tissue injury   . Hypokalemia   . Hypoalbuminemia due to protein-calorie malnutrition ([place])   . Debilitated 01/21/2017  . Neuropathic pain   . Post-operative pain   . Slow transit constipation   . Debility   . Ischemic leg   . PAF (paroxysmal atrial fibrillation) ([place])   . Anemia of chronic disease   . Chronic pain syndrome   . Fibromyalgia   . Stage 3 chronic kidney disease ([place])   . Benign  essential HTN   . Abnormal urinalysis   . PVD (peripheral vascular disease) ([place]) 01/13/2017  . Post-op pain 01/03/2017  . Phantom limb pain ([place]) 01/03/2017  . S/P unilateral BKA (below knee amputation), right ([place]) 12/30/2016  . Acute on chronic combined systolic and diastolic CHF (congestive heart failure) ([place]) 12/22/2016  . Anemia 12/22/2016  . Palliative care encounter   . Lower extremity pain, right   . Acute GI bleeding 10/23/2016  . Paroxysmal atrial fibrillation ([place]) 10/03/2016  . Atherosclerosis of autologous vein bypass graft of left lower extremity with rest pain ([place]) 09/24/2016  . PAD (peripheral artery disease) ([place]) 09/05/2016  . Groin pain 04/02/2015  . Aftercare following surgery of the circulatory system, [place] 12/13/2013  . Shingles 10/26/2013  . Anxiety 10/26/2013  . Acute blood loss anemia 09/05/2013  . Elbow fracture, left 09/05/2013  . Left acetabular fracture ([place]) 09/05/2013  . Ankle fracture, left 09/05/2013  . HTN (hypertension) 09/03/2013  . HLD (hyperlipidemia) 09/03/2013  . Peripheral vascular disease, unspecified ([place]) 05/10/2012  . Chronic total occlusion of artery of the extremities ([facility]) 01/19/2012   Leo Fray, SPTA  Savreen Gebhardt 10/20/2017, 4:49 PM  [facility] [address] [place], [place], [address] Phone: [phone]   Fax:  [phone]  Name: [name] MRN: [id number] Date of Birth: 1928/05/04

## 2017-10-22 ENCOUNTER — Ambulatory Visit: Payer: Medicare Other | Admitting: Physical Therapy

## 2017-10-22 DIAGNOSIS — M79605 Pain in left leg: Secondary | ICD-10-CM

## 2017-10-22 DIAGNOSIS — M6281 Muscle weakness (generalized): Secondary | ICD-10-CM

## 2017-10-22 DIAGNOSIS — M79604 Pain in right leg: Secondary | ICD-10-CM

## 2017-10-22 DIAGNOSIS — R2689 Other abnormalities of gait and mobility: Secondary | ICD-10-CM | POA: Diagnosis not present

## 2017-10-22 DIAGNOSIS — R2681 Unsteadiness on feet: Secondary | ICD-10-CM

## 2017-10-22 DIAGNOSIS — M6249 Contracture of muscle, multiple sites: Secondary | ICD-10-CM | POA: Diagnosis not present

## 2017-10-22 NOTE — Therapy (Addendum)
[facility] [address] [place], [place], [address] Phone: [phone]   Fax:  [phone]  Physical Therapy Treatment  Patient Details  Name: [name] MRN: [id number] Date of Birth: 08-02-1928 Referring Provider: Alger Simons, MD  (dtr requested use of Dr. Naaman Plummer for referral) [name], NP & [name], MD)   Encounter Date: 10/22/2017  PT End of Session - 10/22/17 1455    Visit Number  10    Number of Visits  17    Date for PT Re-Evaluation  11/06/17    Authorization Type  Medicare G-code & progress note every 10th visit    PT Start Time  1346    PT Stop Time  1429    PT Time Calculation (min)  43 min    Equipment Utilized During Treatment  Gait belt    Activity Tolerance  Patient limited by pain;Patient limited by fatigue;Patient tolerated treatment well    Behavior During Therapy  [facility] for tasks assessed/performed       Past Medical History:  Diagnosis Date  . Anemia   . Anginal pain ([place])   . Arthritis    "qwhere" (09/05/2016)  . Atrial fibrillation ([place]) 09/2016  . Chronic lower back pain   . Complication of anesthesia    "takes a long time for it to wear off; I can hallucinate if I take too much" (09/05/2016)  . DVT (deep venous thrombosis) ([place]) 10/2009  . Fall from steps 08/31/2013   Fx. pelvis, Left Hip, Left Elbow  . Fibromyalgia   . GERD (gastroesophageal reflux disease)    09/22/16- "no too much anymore"  . GI bleed 10/24/2016  . High cholesterol   . History of blood transfusion   . History of hiatal hernia   . Hypertension   . Macular degeneration, wet ([place])    "started in right eye; now legally blind in that eye; now started in left eye but pretty much in control" (09/05/2016)  . Osteoporosis   . Peripheral vascular disease ([place])    nonviable tissue Right foot  . PONV (postoperative nausea and vomiting)   . Squamous cell carcinoma of skin of right calf Aug. 2015  .  Stroke ([place])    TIA's no residual  . TIA (transient ischemic attack)    "several at once; none in a long time" (09/05/2016)    Past Surgical History:  Procedure Laterality Date  . ABDOMINAL AORTAGRAM N/A 12/26/2014   Procedure: ABDOMINAL Maxcine Ham;  Surgeon: Serafina Mitchell, MD;  Location: [facility] CATH LAB;  Service: Cardiovascular;  Laterality: N/A;  . AMPUTATION Right 12/23/2016   Procedure: AMPUTATION BELOW KNEE;  Surgeon: Serafina Mitchell, MD;  Location: [facility];  Service: Vascular;  Laterality: Right;  . AORTOGRAM N/A 09/26/2016   Procedure: AORTOGRAM;  Surgeon: Waynetta Sandy, MD;  Location: [facility];  Service: Vascular;  Laterality: N/A;  . CARPAL TUNNEL RELEASE Right   . CATARACT EXTRACTION W/ INTRAOCULAR LENS  IMPLANT, BILATERAL Bilateral   . COLONOSCOPY    . DILATION AND CURETTAGE OF UTERUS    . DRESSING CHANGE UNDER ANESTHESIA Right 01/16/2017   Procedure: DRESSING CHANGE RIGHT BELOW KNEE AMPUTATION;  Surgeon: Serafina Mitchell, MD;  Location: [facility];  Service: Vascular;  Laterality: Right;  . EYE SURGERY Right    "macular OR"  . FEMORAL ARTERY STENT  12-11-10   Left SFA  . FEMORAL-POPLITEAL BYPASS GRAFT Left 09/24/2016   Procedure: REDO FEMORAL TO POPLITEAL ARTERY BYPASS  GRAFT USING 6MM PROPATEN RINGED GORTEX GRAFT;  Surgeon: Serafina Mitchell, MD;  Location: [facility];  Service: Vascular;  Laterality: Left;  . FEMORAL-TIBIAL BYPASS GRAFT Left 01/16/2017   Procedure: REDO BYPASS GRAFT FEMORAL-TIBIAL ARTERY WITH GORTEX GRAFT;  Surgeon: Serafina Mitchell, MD;  Location: [facility];  Service: Vascular;  Laterality: Left;  AND LOWER LEG   . I&D EXTREMITY Right 12/17/2016   Procedure: IRRIGATION AND DEBRIDEMENT RIGHT FOOT;  Surgeon: Serafina Mitchell, MD;  Location: [employer];  Service: Vascular;  Laterality: Right;  . INCISION AND DRAINAGE OF WOUND Left 10/25/2009   leg/notes 11/13/2009  . INSERTION OF ILIAC STENT Left 12/26/2014   Procedure: INSERTION OF ILIAC STENT;  Surgeon: Serafina Mitchell, MD;  Location:  [facility] CATH LAB;  Service: Cardiovascular;  Laterality: Left;  . INSERTION OF ILIAC STENT Left 09/26/2016   Procedure: SUB INTIMAL INSERTION OF SUPERFICIAL FEMORAL ARTERY AND BELOW KNEE BYPASS GRAFT;  Surgeon: Waynetta Sandy, MD;  Location: [facility];  Service: Vascular;  Laterality: Left;  . JOINT REPLACEMENT     knee  . JOINT REPLACEMENT Left Oct. 17, 2014   Elbow ( pt fell 08-31-13 )  . ORIF SHOULDER FRACTURE Right    "it was crushed"  . PERIPHERAL VASCULAR CATHETERIZATION N/A 10/30/2015   Procedure: Abdominal Aortogram w/Lower Extremity;  Surgeon: Serafina Mitchell, MD;  Location: [facility] CV LAB;  Service: Cardiovascular;  Laterality: N/A;  . PERIPHERAL VASCULAR CATHETERIZATION  10/30/2015   Procedure: Peripheral Vascular Intervention;  Surgeon: Serafina Mitchell, MD;  Location: [facility] CV LAB;  Service: Cardiovascular;;  . PERIPHERAL VASCULAR CATHETERIZATION N/A 04/01/2016   Procedure: Abdominal Aortogram w/Lower Extremity;  Surgeon: Serafina Mitchell, MD;  Location: [facility] CV LAB;  Service: Cardiovascular;  Laterality: N/A;  . PERIPHERAL VASCULAR CATHETERIZATION Left 04/01/2016   Procedure: Peripheral Vascular Atherectomy;  Surgeon: Serafina Mitchell, MD;  Location: [facility] CV LAB;  Service: Cardiovascular;  Laterality: Left;  Superficial femoral artery.  Marland Kitchen PERIPHERAL VASCULAR CATHETERIZATION Right 09/05/2016   "stent"  . PERIPHERAL VASCULAR CATHETERIZATION N/A 09/05/2016   Procedure: Abdominal Aortogram w/Lower Extremity;  Surgeon: Serafina Mitchell, MD;  Location: [facility] CV LAB;  Service: Cardiovascular;  Laterality: N/A;  . PERIPHERAL VASCULAR CATHETERIZATION Right 09/05/2016   Procedure: Peripheral Vascular Intervention;  Surgeon: Serafina Mitchell, MD;  Location: [facility] CV LAB;  Service: Cardiovascular;  Laterality: Right;  Superficial Femoral  . PERIPHERAL VASCULAR CATHETERIZATION Left 09/09/2016   Procedure: Lower Extremity Angiography;  Surgeon: Serafina Mitchell,  MD;  Location: [facility] CV LAB;  Service: Cardiovascular;  Laterality: Left;  . PR VEIN BYPASS GRAFT,AORTO-FEM-POP  09-13-09   Left Fem-pop  . THROMBECTOMY FEMORAL ARTERY Right 09/26/2016   Procedure: Thromboembolectomy Right Lower Extremity, Right Femoral Artery Endarterectomy with Patch Angioplasty; Right Lower Extremity Angiogram ;  Surgeon: Waynetta Sandy, MD;  Location: [facility];  Service: Vascular;  Laterality: Right;  . TOTAL ELBOW ARTHROPLASTY Left 09/03/2013   Procedure: LEFT TOTAL ELBOW ARTHROPLASTY;  Surgeon: Roseanne Kaufman, MD;  Location: [facility];  Service: Orthopedics;  Laterality: Left;  . TOTAL KNEE ARTHROPLASTY Left 06/2006  . TRANSMETATARSAL AMPUTATION Right 12/11/2016   Procedure: TRANSMETATARSAL AMPUTATION;  Surgeon: Serafina Mitchell, MD;  Location: [facility];  Service: Vascular;  Laterality: Right;  . TUBAL LIGATION    . VAGINAL HYSTERECTOMY      There were no vitals filed for this visit.  Subjective Assessment - 10/22/17 1450    Subjective  No new complaints. Pt states she has not yet used walker at home. Pt feels prosthetic limb has been a bit swollen lately.    Patient is accompained by:  Family member    Pertinent History  Right TTA 12/23/16, left foot drop, Left bypass graft Oct 2010 & 2018, arthritis, A-fib, DVT, fall from stetps fx pelvis, left hip & left elbow, fx right shoulder, fibromyalgia, HTN, macular degeneration blind right eye, osteoporosis, PVD, CVA/TIA,     Limitations  Lifting;Standing;Walking;House hold activities    Patient Stated Goals  To use prosthesis to get around her house and get into car to go places like church.     Currently in Pain?  Yes    Pain Score  3     Pain Location  Leg    Pain Orientation  Right    Pain Descriptors / Indicators  Aching;Sore    Pain Type  Chronic pain    Pain Onset  More than a month ago    Aggravating Factors   arthritis, wearing AFO & prosthetic liner, hanging leg down    Pain Relieving Factors  medication        [facility] Adult PT Treatment/Exercise - 10/22/17 0001      Ambulation/Gait   Ambulation/Gait  Yes    Ambulation/Gait Assistance  4: Min assist    Ambulation/Gait Assistance Details  Manual assist to control Trendelenberg. VC's for upright posture and to control scissoring with feet. Pt did not take any resting breaks.    Ambulation Distance (Feet)  175 Feet X1,    Assistive device  Left platform walker;Prosthesis;Other (Comment) AFO    Gait Pattern  Step-through pattern;Decreased step length - left;Decreased stride length;Decreased weight shift to left    Ambulation Surface  Level;Indoor    Gait Comments  Pt ambulated 115 feet with no rest breaks. Doffed prosthetic and liner to check skin which was slightly red and tender. Donned liner and prosthesis and ambulated another 60 ft.       Self-Care   Self-Care  Other Self-Care Comments    Other Self-Care Comments   Pt presented with some swelling of RLE, only one sock necessary to apply prosthesis. Therapist applied AFO to LLE, pt presents with increased supination with foot in shoe.       Prosthetics   Prosthetic Care Comments   Pt assisted in donning liner. PT placed liner against distal limb & maintained contact, pt able to donne when bending knee of residual limb with increased time.     Current prosthetic wear tolerance (days/week)   daily    Current prosthetic wear tolerance (#hours/day)   Pt wearing liner most of awake hours but has increasing limb pain.    Residual limb condition   RLE with tenderness, pitting edema in BLEs with dependent position    Education Provided  Residual limb care;Proper wear schedule/adjustment;Proper weight-bearing schedule/adjustment;Proper Donning;Other (comment) see prosthetic care comments    Person(s) Educated  Patient;Child(ren)    Education Method  Explanation;Demonstration;Verbal cues;Tactile cues    Education Method  Verbalized understanding;Tactile cues required;Verbal cues required;Needs further  instruction    Donning Prosthesis  Moderate assist    Doffing Prosthesis  Supervision;Minimal assist min prosthesis/supervision for socks/liner        PT Short Term Goals - 10/07/17 1148      PT SHORT TERM GOAL #1   Title  Patient & dtr demonstrate proper donning of prosthesis & AFO. (All STGs Target Date 10/07/2017)    Baseline  MET 10/06/2017    Time  4    Period  Weeks    Status  Achieved      PT SHORT TERM GOAL #2   Title  Patient tolerates wear of prosthesis & AFO >5 hrs total/day with skin issues.     Baseline  Partially MET 10/06/2017  She tolerates wear of prosthetic liner >8 hrs total /day, prosthesis >5hrs total & AFO 2-4hrs total/day.     Time  4    Period  Weeks    Status  Partially Met      PT SHORT TERM GOAL #3   Title  Patient sit to/from stand w/c to RW with minA.    Baseline  MET 10/06/2017    Time  4    Period  Weeks    Status  Achieved      PT SHORT TERM GOAL #4   Title  Patient able to stand with RW support with prosthesis & RW for 2 minutes with supervision.     Baseline  MET 10/06/2017    Time  4    Period  Weeks    Status  Achieved      PT SHORT TERM GOAL #5   Title  Patient ambulates 56' with RW, prosthesis & AFO with modA.    Baseline  MET 10/06/2017    Time  4    Period  Weeks    Status  Achieved        PT Long Term Goals - 09/16/17 2011      PT LONG TERM GOAL #1   Title  Patient's daughter & patient verbalize proper prosthetic care. (All LTGs Target Date 11/06/2017)    Time  8    Period  Weeks    Status  New    Target Date  11/06/17      PT LONG TERM GOAL #2   Title  Patient tolerates wear of TTA prosthesis & AFO >50% of awake hours without skin issues.     Time  8    Period  Weeks    Status  On-going    Target Date  11/06/17      PT LONG TERM GOAL #3   Title  Patient performs sit to/from stand transfers chairs with armrests to RW with supervision.     Time  8    Period  Weeks    Status  On-going    Target Date  11/06/17       PT LONG TERM GOAL #4   Title  Patient standing balance with prosthesis, AFO & RW reaching 5" anterior to RW, scanning environment with head turns and managing pants for toileting with minA.     Time  8    Period  Weeks    Status  On-going    Target Date  11/06/17      PT LONG TERM GOAL #5   Title  Patient ambulates 46' with RW, prosthesis & AFO with minA.    Time  8    Period  Weeks    Status  On-going    Target Date  11/06/17       Plan - 10/22/17 1531    Clinical Impression Statement  Session focused on gait training with prosthesis and self-care. Pt increased ambulation distance and will continue to benefit from further PT sessions to improve functional independence.     Rehab Potential  Good    Clinical Impairments Affecting Rehab Potential  Right TTA 12/23/16, left  foot drop, L bypass graft Oct 2010, arthritis, A-fib, DVT, Blood clot LLE foot drop, R TTA, fall from stetps fx pelvis, left hip & left elbow, fibromyalgia, HTN, macular degeneration blind right eye, osteoporosis, PVD, CVA, TIA, right elbow replacement, chronic LBP,     PT Frequency  2x / week    PT Duration  8 weeks    PT Treatment/Interventions  ADLs/Self Care Home Management;Moist Heat;DME Instruction;Gait training;Stair training;Functional mobility training;Therapeutic activities;Therapeutic exercise;Balance training;Neuromuscular re-education;Patient/family education;Orthotic Fit/Training;Prosthetic Training;Vestibular    PT Next Visit Plan  Continue prosthetic gait with left platform RW, car transfers with prosthesis/AFO donne    Consulted and Agree with Plan of Care  Patient;Family member/caregiver    Family Member Consulted  daughter       Patient will benefit from skilled therapeutic intervention in order to improve the following deficits and impairments:  Abnormal gait, Cardiopulmonary status limiting activity, Decreased activity tolerance, Decreased balance, Decreased endurance, Decreased knowledge of use  of DME, Decreased range of motion, Decreased mobility, Decreased skin integrity, Decreased strength, Difficulty walking, Increased edema, Impaired flexibility, Postural dysfunction, Pain, Prosthetic Dependency, Dizziness  Visit Diagnosis: Muscle weakness (generalized)  Unsteadiness on feet  Other abnormalities of gait and mobility  Pain in left leg  Pain in right leg  Contracture of muscle, multiple sites   G-Codes - 10-28-2017 1456    Functional Assessment Tool Used (Outpatient Only)  Prosthetic wear: liner for most awake hours and prosthetic 2-4 hours/day. Pt is min Assist to don socks and liner, mod assist to don/doff prosthesis & AFO.    Functional Limitation  Self care       Problem List Patient Active Problem List   Diagnosis Date Noted  . Plantar fasciitis of left foot 06/10/2017  . Idiopathic chronic venous hypertension of both lower extremities with inflammation 05/21/2017  . Foot drop, left 05/21/2017  . Decubitus ulcer of sacral region, unstageable ([place])   . Deep tissue injury   . Hypokalemia   . Hypoalbuminemia due to protein-calorie malnutrition ([place])   . Debilitated 01/21/2017  . Neuropathic pain   . Post-operative pain   . Slow transit constipation   . Debility   . Ischemic leg   . PAF (paroxysmal atrial fibrillation) ([place])   . Anemia of chronic disease   . Chronic pain syndrome   . Fibromyalgia   . Stage 3 chronic kidney disease ([place])   . Benign essential HTN   . Abnormal urinalysis   . PVD (peripheral vascular disease) ([place]) 01/13/2017  . Post-op pain 01/03/2017  . Phantom limb pain ([employer]) 01/03/2017  . S/P unilateral BKA (below knee amputation), right ([place]) 12/30/2016  . Acute on chronic combined systolic and diastolic CHF (congestive heart failure) ([place]) 12/22/2016  . Anemia 12/22/2016  . Palliative care encounter   . Lower extremity pain, right   . Acute GI bleeding 10/23/2016  . Paroxysmal atrial fibrillation ([place]) 10/03/2016  . Atherosclerosis  of autologous vein bypass graft of left lower extremity with rest pain ([place]) 09/24/2016  . PAD (peripheral artery disease) ([place]) 09/05/2016  . Groin pain 04/02/2015  . Aftercare following surgery of the circulatory system, [place] 12/13/2013  . Shingles 10/26/2013  . Anxiety 10/26/2013  . Acute blood loss anemia 09/05/2013  . Elbow fracture, left 09/05/2013  . Left acetabular fracture ([place]) 09/05/2013  . Ankle fracture, left 09/05/2013  . HTN (hypertension) 09/03/2013  . HLD (hyperlipidemia) 09/03/2013  . Peripheral vascular disease, unspecified ([place]) 05/10/2012  . Chronic total occlusion of artery of  the extremities [facility]) 01/19/2012    Andria Meuse, [place] 19-Nov-2017, 3:44 PM  [facility] [address] [place], [place], [address] Phone: [phone]   Fax:  [phone]  Name: [name] MRN: [id number] Date of Birth: Sep 05, 1928  G-Codes - 11-19-2017 1456    Functional Assessment Tool Used (Outpatient Only)  Prosthetic wear: liner for most awake hours and prosthetic 2-4 hours/day. Pt is min Assist to don socks and liner, mod assist to don/doff prosthesis & AFO.    Functional Limitation  Self care    Self Care Current Status [phone])  At least 60 percent but less than 80 percent impaired, limited or restricted    Self Care Goal Status (W6203)  At least 20 percent but less than 40 percent impaired, limited or restricted      Physical Therapy Progress Note  Dates of Reporting Period: 09/09/2017 to Nov 19, 2017  Objective Reports of Subjective Statement: Patient & daughter report increased wear & function with prosthesis & AFO.   Objective Measurements: see above  Goal Update: see above  Plan: continue established plan  Reason Skilled Services are Required: Patient and daughter, primary caregiver, need skilled instruction in progressing prosthesis & AFO wear & use to improve mobility & function.   Jamey Reas, PT,  DPT PT Specializing in [place] 19-Nov-2017 5:49 PM Phone:  [phone]  Fax:  [phone] [facility] [address] [place], [place] [address]

## 2017-10-27 ENCOUNTER — Encounter: Payer: Medicare Other | Admitting: Physical Therapy

## 2017-10-29 ENCOUNTER — Ambulatory Visit: Payer: Medicare Other | Admitting: Physical Therapy

## 2017-10-29 ENCOUNTER — Encounter: Payer: Self-pay | Admitting: Physical Therapy

## 2017-10-29 DIAGNOSIS — R2689 Other abnormalities of gait and mobility: Secondary | ICD-10-CM | POA: Diagnosis not present

## 2017-10-29 DIAGNOSIS — M6281 Muscle weakness (generalized): Secondary | ICD-10-CM

## 2017-10-29 DIAGNOSIS — M6249 Contracture of muscle, multiple sites: Secondary | ICD-10-CM | POA: Diagnosis not present

## 2017-10-29 DIAGNOSIS — M79604 Pain in right leg: Secondary | ICD-10-CM | POA: Diagnosis not present

## 2017-10-29 DIAGNOSIS — M79605 Pain in left leg: Secondary | ICD-10-CM | POA: Diagnosis not present

## 2017-10-29 DIAGNOSIS — R2681 Unsteadiness on feet: Secondary | ICD-10-CM

## 2017-10-30 NOTE — Therapy (Signed)
[name] [name], [place], [address] Phone: [phone]   Fax:  [phone]  Physical Therapy Treatment  Patient Details  Name: [name] MRN: [id number] Date of Birth: December 03, 1927 Referring Provider: Alger Simons, MD  (dtr requested use of Dr. Naaman Plummer for referral) [name], NP & [name], MD)   Encounter Date: 10/29/2017  PT End of Session - 10/29/17 1323    Visit Number  11    Number of Visits  17    Date for PT Re-Evaluation  11/06/17    Authorization Type  Medicare G-code & progress note every 10th visit    PT Start Time  1318    PT Stop Time  1357    PT Time Calculation (min)  39 min    Equipment Utilized During Treatment  Gait belt    Activity Tolerance  Patient limited by pain;Patient limited by fatigue;Patient tolerated treatment well    Behavior During Therapy  [facility] for tasks assessed/performed       Past Medical History:  Diagnosis Date  . Anemia   . Anginal pain ([place])   . Arthritis    "qwhere" (09/05/2016)  . Atrial fibrillation ([place]) 09/2016  . Chronic lower back pain   . Complication of anesthesia    "takes a long time for it to wear off; I can hallucinate if I take too much" (09/05/2016)  . DVT (deep venous thrombosis) ([place]) 10/2009  . Fall from steps 08/31/2013   Fx. pelvis, Left Hip, Left Elbow  . Fibromyalgia   . GERD (gastroesophageal reflux disease)    09/22/16- "no too much anymore"  . GI bleed 10/24/2016  . High cholesterol   . History of blood transfusion   . History of hiatal hernia   . Hypertension   . Macular degeneration, wet ([place])    "started in right eye; now legally blind in that eye; now started in left eye but pretty much in control" (09/05/2016)  . Osteoporosis   . Peripheral vascular disease ([place])    nonviable tissue Right foot  . PONV (postoperative nausea and vomiting)   . Squamous cell carcinoma of skin of right calf Aug. 2015  .  Stroke ([place])    TIA's no residual  . TIA (transient ischemic attack)    "several at once; none in a long time" (09/05/2016)    Past Surgical History:  Procedure Laterality Date  . ABDOMINAL AORTAGRAM N/A 12/26/2014   Procedure: ABDOMINAL Maxcine Ham;  Surgeon: Serafina Mitchell, MD;  Location: [facility] CATH LAB;  Service: Cardiovascular;  Laterality: N/A;  . AMPUTATION Right 12/23/2016   Procedure: AMPUTATION BELOW KNEE;  Surgeon: Serafina Mitchell, MD;  Location: [place];  Service: Vascular;  Laterality: Right;  . AORTOGRAM N/A 09/26/2016   Procedure: AORTOGRAM;  Surgeon: Waynetta Sandy, MD;  Location: [place];  Service: Vascular;  Laterality: N/A;  . CARPAL TUNNEL RELEASE Right   . CATARACT EXTRACTION W/ INTRAOCULAR LENS  IMPLANT, BILATERAL Bilateral   . COLONOSCOPY    . DILATION AND CURETTAGE OF UTERUS    . DRESSING CHANGE UNDER ANESTHESIA Right 01/16/2017   Procedure: DRESSING CHANGE RIGHT BELOW KNEE AMPUTATION;  Surgeon: Serafina Mitchell, MD;  Location: [place];  Service: Vascular;  Laterality: Right;  . EYE SURGERY Right    "macular OR"  . FEMORAL ARTERY STENT  12-11-10   Left SFA  . FEMORAL-POPLITEAL BYPASS GRAFT Left 09/24/2016   Procedure: REDO FEMORAL TO POPLITEAL ARTERY BYPASS  GRAFT USING 6MM PROPATEN RINGED GORTEX GRAFT;  Surgeon: Serafina Mitchell, MD;  Location: [place];  Service: Vascular;  Laterality: Left;  . FEMORAL-TIBIAL BYPASS GRAFT Left 01/16/2017   Procedure: REDO BYPASS GRAFT FEMORAL-TIBIAL ARTERY WITH GORTEX GRAFT;  Surgeon: Serafina Mitchell, MD;  Location: [place];  Service: Vascular;  Laterality: Left;  AND LOWER LEG   . I&D EXTREMITY Right 12/17/2016   Procedure: IRRIGATION AND DEBRIDEMENT RIGHT FOOT;  Surgeon: Serafina Mitchell, MD;  Location: [place];  Service: Vascular;  Laterality: Right;  . INCISION AND DRAINAGE OF WOUND Left 10/25/2009   leg/notes 11/13/2009  . INSERTION OF ILIAC STENT Left 12/26/2014   Procedure: INSERTION OF ILIAC STENT;  Surgeon: Serafina Mitchell, MD;  Location:  [facility] CATH LAB;  Service: Cardiovascular;  Laterality: Left;  . INSERTION OF ILIAC STENT Left 09/26/2016   Procedure: SUB INTIMAL INSERTION OF SUPERFICIAL FEMORAL ARTERY AND BELOW KNEE BYPASS GRAFT;  Surgeon: Waynetta Sandy, MD;  Location: [place];  Service: Vascular;  Laterality: Left;  . JOINT REPLACEMENT     knee  . JOINT REPLACEMENT Left Oct. 17, 2014   Elbow ( pt fell 08-31-13 )  . ORIF SHOULDER FRACTURE Right    "it was crushed"  . PERIPHERAL VASCULAR CATHETERIZATION N/A 10/30/2015   Procedure: Abdominal Aortogram w/Lower Extremity;  Surgeon: Serafina Mitchell, MD;  Location: [place] CV LAB;  Service: Cardiovascular;  Laterality: N/A;  . PERIPHERAL VASCULAR CATHETERIZATION  10/30/2015   Procedure: Peripheral Vascular Intervention;  Surgeon: Serafina Mitchell, MD;  Location: [place] CV LAB;  Service: Cardiovascular;;  . PERIPHERAL VASCULAR CATHETERIZATION N/A 04/01/2016   Procedure: Abdominal Aortogram w/Lower Extremity;  Surgeon: Serafina Mitchell, MD;  Location: [place] CV LAB;  Service: Cardiovascular;  Laterality: N/A;  . PERIPHERAL VASCULAR CATHETERIZATION Left 04/01/2016   Procedure: Peripheral Vascular Atherectomy;  Surgeon: Serafina Mitchell, MD;  Location: [place] [facility] CV LAB;  Service: Cardiovascular;  Laterality: Left;  Superficial femoral artery.  Marland Kitchen PERIPHERAL VASCULAR CATHETERIZATION Right 09/05/2016   "stent"  . PERIPHERAL VASCULAR CATHETERIZATION N/A 09/05/2016   Procedure: Abdominal Aortogram w/Lower Extremity;  Surgeon: Serafina Mitchell, MD;  Location: [place] CV LAB;  Service: Cardiovascular;  Laterality: N/A;  . PERIPHERAL VASCULAR CATHETERIZATION Right 09/05/2016   Procedure: Peripheral Vascular Intervention;  Surgeon: Serafina Mitchell, MD;  Location: [place] CV LAB;  Service: Cardiovascular;  Laterality: Right;  Superficial Femoral  . PERIPHERAL VASCULAR CATHETERIZATION Left 09/09/2016   Procedure: Lower Extremity Angiography;  Surgeon: Serafina Mitchell,  MD;  Location: [place] [facility] CV LAB;  Service: Cardiovascular;  Laterality: Left;  . PR VEIN BYPASS GRAFT,AORTO-FEM-POP  09-13-09   Left Fem-pop  . THROMBECTOMY FEMORAL ARTERY Right 09/26/2016   Procedure: Thromboembolectomy Right Lower Extremity, Right Femoral Artery Endarterectomy with Patch Angioplasty; Right Lower Extremity Angiogram ;  Surgeon: Waynetta Sandy, MD;  Location: [place];  Service: Vascular;  Laterality: Right;  . TOTAL ELBOW ARTHROPLASTY Left 09/03/2013   Procedure: LEFT TOTAL ELBOW ARTHROPLASTY;  Surgeon: Roseanne Kaufman, MD;  Location: [place];  Service: Orthopedics;  Laterality: Left;  . TOTAL KNEE ARTHROPLASTY Left 06/2006  . TRANSMETATARSAL AMPUTATION Right 12/11/2016   Procedure: TRANSMETATARSAL AMPUTATION;  Surgeon: Serafina Mitchell, MD;  Location: [place];  Service: Vascular;  Laterality: Right;  . TUBAL LIGATION    . VAGINAL HYSTERECTOMY      There were no vitals filed for this visit.  Subjective Assessment - 10/29/17 1321    Subjective  No new complaints. No falls to report. No changes in pain.    Patient is accompained by:  Family member    Pertinent History  Right TTA 12/23/16, left foot drop, Left bypass graft Oct 2010 & 2018, arthritis, A-fib, DVT, fall from stetps fx pelvis, left hip & left elbow, fx right shoulder, fibromyalgia, HTN, macular degeneration blind right eye, osteoporosis, PVD, CVA/TIA,     Limitations  Lifting;Standing;Walking;House hold activities    Patient Stated Goals  To use prosthesis to get around her house and get into car to go places like church.     Currently in Pain?  Yes    Pain Score  3     Pain Location  Leg    Pain Orientation  Right    Pain Descriptors / Indicators  Aching;Sore    Pain Type  Chronic pain    Pain Onset  More than a month ago    Pain Frequency  Constant    Aggravating Factors   arthritis, wearing AFO/prosthesis    Pain Relieving Factors  medication, rest, removing AFO/prosthesis           [facility] Adult PT  Treatment/Exercise - 10/29/17 1324      Transfers   Transfers  Sit to Stand;Stand to Sit    Sit to Stand  4: Min assist;With upper extremity assist;From chair/3-in-1    Sit to Stand Details  Tactile cues for placement;Verbal cues for sequencing;Verbal cues for technique;Verbal cues for precautions/safety;Verbal cues for safe use of DME/AE;Manual facilitation for weight bearing;Manual facilitation for weight shifting    Sit to Stand Details (indicate cue type and reason)  cues for foot placement and for anterior weight shifting to assist with standing    Stand to Sit  4: Min guard;With upper extremity assist;To chair/3-in-1    Stand to Sit Details (indicate cue type and reason)  Verbal cues for sequencing;Verbal cues for technique;Verbal cues for safe use of DME/AE;Manual facilitation for weight shifting;Verbal cues for precautions/safety    Stand to Sit Details  cues to remove UE from platform and reach back with right arm to assist with controlled descent       Ambulation/Gait   Ambulation/Gait  Yes    Ambulation/Gait Assistance  4: Min assist    Ambulation/Gait Assistance Details  verbal/visual cues on posture, step length and step placement.  manual facilitation needed only at end of both gait trial due to fatigue to prevent scissoring of feet.                    Ambulation Distance (Feet)  120 Feet x2    Assistive device  Left platform walker;Prosthesis;Other (Comment)    Gait Pattern  Step-through pattern;Decreased step length - left;Decreased stride length;Decreased weight shift to left    Ambulation Surface  Level;Indoor      Prosthetics   Current prosthetic wear tolerance (days/week)   daily    Current prosthetic wear tolerance (#hours/day)   Pt wearing liner most of awake hours but has increasing limb pain.    Residual limb condition   RLE with tenderness, pitting edema in BLEs with dependent position    Education Provided  Residual limb care;Proper wear schedule/adjustment;Proper  weight-bearing schedule/adjustment;Proper Donning;Other (comment)    Person(s) Educated  Patient;Child(ren)    Education Method  Explanation;Demonstration    Education Method  Verbalized understanding;Returned demonstration;Verbal cues required    Donning Prosthesis  Moderate assist    Doffing Prosthesis  Supervision;Minimal assist  PT Short Term Goals - 10/07/17 1148      PT SHORT TERM GOAL #1   Title  Patient & dtr demonstrate proper donning of prosthesis & AFO. (All STGs Target Date 10/07/2017)    Baseline  MET 10/06/2017    Time  4    Period  Weeks    Status  Achieved      PT SHORT TERM GOAL #2   Title  Patient tolerates wear of prosthesis & AFO >5 hrs total/day with skin issues.     Baseline  Partially MET 10/06/2017  She tolerates wear of prosthetic liner >8 hrs total /day, prosthesis >5hrs total & AFO 2-4hrs total/day.     Time  4    Period  Weeks    Status  Partially Met      PT SHORT TERM GOAL #3   Title  Patient sit to/from stand w/c to RW with minA.    Baseline  MET 10/06/2017    Time  4    Period  Weeks    Status  Achieved      PT SHORT TERM GOAL #4   Title  Patient able to stand with RW support with prosthesis & RW for 2 minutes with supervision.     Baseline  MET 10/06/2017    Time  4    Period  Weeks    Status  Achieved      PT SHORT TERM GOAL #5   Title  Patient ambulates 28' with RW, prosthesis & AFO with modA.    Baseline  MET 10/06/2017    Time  4    Period  Weeks    Status  Achieved        PT Long Term Goals - 09/16/17 2011      PT LONG TERM GOAL #1   Title  Patient's daughter & patient verbalize proper prosthetic care. (All LTGs Target Date 11/06/2017)    Time  8    Period  Weeks    Status  New    Target Date  11/06/17      PT LONG TERM GOAL #2   Title  Patient tolerates wear of TTA prosthesis & AFO >50% of awake hours without skin issues.     Time  8    Period  Weeks    Status  On-going    Target Date  11/06/17      PT  LONG TERM GOAL #3   Title  Patient performs sit to/from stand transfers chairs with armrests to RW with supervision.     Time  8    Period  Weeks    Status  On-going    Target Date  11/06/17      PT LONG TERM GOAL #4   Title  Patient standing balance with prosthesis, AFO & RW reaching 5" anterior to RW, scanning environment with head turns and managing pants for toileting with minA.     Time  8    Period  Weeks    Status  On-going    Target Date  11/06/17      PT LONG TERM GOAL #5   Title  Patient ambulates 30' with RW, prosthesis & AFO with minA.    Time  8    Period  Weeks    Status  On-going    Target Date  11/06/17          Plan - 10/29/17 1323    Clinical Impression Statement  Today's skilled session  focused on transfers/gait with prosthesis and AFO. Pt continues to have pain with both duing weight bearing, howver was able to increase both her consecutive and overall gait distances this session. Pt's activity tolerance continues to show improvement as well. Pt is progressing well toward goals and should benefit from continued PT to progress toward  unmet goals.     Rehab Potential  Good    Clinical Impairments Affecting Rehab Potential  Right TTA 12/23/16, left foot drop, L bypass graft Oct 2010, arthritis, A-fib, DVT, Blood clot LLE foot drop, R TTA, fall from stetps fx pelvis, left hip & left elbow, fibromyalgia, HTN, macular degeneration blind right eye, osteoporosis, PVD, CVA, TIA, right elbow replacement, chronic LBP,     PT Frequency  2x / week    PT Duration  8 weeks    PT Treatment/Interventions  ADLs/Self Care Home Management;Moist Heat;DME Instruction;Gait training;Stair training;Functional mobility training;Therapeutic activities;Therapeutic exercise;Balance training;Neuromuscular re-education;Patient/family education;Orthotic Fit/Training;Prosthetic Training;Vestibular    PT Next Visit Plan  begin to work on short, household distances with 90 degree turns so to  transition pt to gait at home with daughter assist only, car transfers with prosthesis/AFO and walker    Consulted and Agree with Plan of Care  Patient;Family member/caregiver    Family Member Consulted  daughter       Patient will benefit from skilled therapeutic intervention in order to improve the following deficits and impairments:  Abnormal gait, Cardiopulmonary status limiting activity, Decreased activity tolerance, Decreased balance, Decreased endurance, Decreased knowledge of use of DME, Decreased range of motion, Decreased mobility, Decreased skin integrity, Decreased strength, Difficulty walking, Increased edema, Impaired flexibility, Postural dysfunction, Pain, Prosthetic Dependency, Dizziness  Visit Diagnosis: Muscle weakness (generalized)  Unsteadiness on feet  Other abnormalities of gait and mobility     Problem List Patient Active Problem List   Diagnosis Date Noted  . Plantar fasciitis of left foot 06/10/2017  . Idiopathic chronic venous hypertension of both lower extremities with inflammation 05/21/2017  . Foot drop, left 05/21/2017  . Decubitus ulcer of sacral region, unstageable ([place])   . Deep tissue injury   . Hypokalemia   . Hypoalbuminemia due to protein-calorie malnutrition ([place])   . Debilitated 01/21/2017  . Neuropathic pain   . Post-operative pain   . Slow transit constipation   . Debility   . Ischemic leg   . PAF (paroxysmal atrial fibrillation) ([place])   . Anemia of chronic disease   . Chronic pain syndrome   . Fibromyalgia   . Stage 3 chronic kidney disease ([place])   . Benign essential HTN   . Abnormal urinalysis   . PVD (peripheral vascular disease) ([place]) 01/13/2017  . Post-op pain 01/03/2017  . Phantom limb pain ([place]) 01/03/2017  . S/P unilateral BKA (below knee amputation), right ([place]) 12/30/2016  . Acute on chronic combined systolic and diastolic CHF (congestive heart failure) ([place]) 12/22/2016  . Anemia 12/22/2016  . Palliative care encounter    . Lower extremity pain, right   . Acute GI bleeding 10/23/2016  . Paroxysmal atrial fibrillation ([place]) 10/03/2016  . Atherosclerosis of autologous vein bypass graft of left lower extremity with rest pain ([place]) 09/24/2016  . PAD (peripheral artery disease) ([place]) 09/05/2016  . Groin pain 04/02/2015  . Aftercare following surgery of the circulatory system, [place] 12/13/2013  . Shingles 10/26/2013  . Anxiety 10/26/2013  . Acute blood loss anemia 09/05/2013  . Elbow fracture, left 09/05/2013  . Left acetabular fracture ([place]) 09/05/2013  . Ankle fracture, left  09/05/2013  . HTN (hypertension) 09/03/2013  . HLD (hyperlipidemia) 09/03/2013  . Peripheral vascular disease, unspecified ([place]) 05/10/2012  . Chronic total occlusion of artery of the extremities ([facility]) 01/19/2012    Willow Ora, PTA, [facility] [address] [place], [place] [address] [phone] 10/30/17, 9:56 AM   Name: JOHANNAH ROZAS MRN: [id number] Date of Birth: 09-30-1928

## 2017-11-03 ENCOUNTER — Encounter: Payer: Self-pay | Admitting: Physical Therapy

## 2017-11-03 ENCOUNTER — Ambulatory Visit: Payer: Medicare Other | Admitting: Physical Therapy

## 2017-11-03 DIAGNOSIS — R2689 Other abnormalities of gait and mobility: Secondary | ICD-10-CM

## 2017-11-03 DIAGNOSIS — M79604 Pain in right leg: Secondary | ICD-10-CM | POA: Diagnosis not present

## 2017-11-03 DIAGNOSIS — M79605 Pain in left leg: Secondary | ICD-10-CM | POA: Diagnosis not present

## 2017-11-03 DIAGNOSIS — R2681 Unsteadiness on feet: Secondary | ICD-10-CM

## 2017-11-03 DIAGNOSIS — M6249 Contracture of muscle, multiple sites: Secondary | ICD-10-CM

## 2017-11-03 DIAGNOSIS — M6281 Muscle weakness (generalized): Secondary | ICD-10-CM | POA: Diagnosis not present

## 2017-11-04 NOTE — Therapy (Signed)
[facility] [address] [place], [place], [address] Phone: [phone]   Fax:  [phone]  Physical Therapy Treatment  Patient Details  Name: [name] MRN: [id number] Date of Birth: [birth year] Referring Provider: Alger Simons, MD  (dtr requested use of Dr. Naaman Plummer for referral) [name], NP & [name], MD)   Encounter Date: 11/03/2017  PT End of Session - 11/03/17 1856    Visit Number  12    Number of Visits  17    Date for PT Re-Evaluation  11/06/17    Authorization Type  Medicare G-code & progress note every 10th visit    PT Start Time  1315    PT Stop Time  1408    PT Time Calculation (min)  53 min    Equipment Utilized During Treatment  Gait belt    Activity Tolerance  Patient limited by pain;Patient limited by fatigue;Patient tolerated treatment well    Behavior During Therapy  [facility] for tasks assessed/performed       Past Medical History:  Diagnosis Date  . Anemia   . Anginal pain ([place])   . Arthritis    "qwhere" (09/05/2016)  . Atrial fibrillation ([place]) 09/2016  . Chronic lower back pain   . Complication of anesthesia    "takes a long time for it to wear off; I can hallucinate if I take too much" (09/05/2016)  . DVT (deep venous thrombosis) ([place]) 10/2009  . Fall from steps 08/31/2013   Fx. pelvis, Left Hip, Left Elbow  . Fibromyalgia   . GERD (gastroesophageal reflux disease)    09/22/16- "no too much anymore"  . GI bleed 10/24/2016  . High cholesterol   . History of blood transfusion   . History of hiatal hernia   . Hypertension   . 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 Osteoporosis   . Peripheral vascular disease ([place])    nonviable tissue Right foot  . PONV (postoperative nausea and vomiting)   . Squamous cell carcinoma of skin of right calf Aug. 2015  .  Stroke ([place])    TIA's no residual  . TIA (transient ischemic attack)    "several at once; none in a long time" (09/05/2016)    Past Surgical History:  Procedure Laterality Date  . ABDOMINAL AORTAGRAM N/A 12/26/2014   Procedure: ABDOMINAL Maxcine Ham;  Surgeon: Serafina Mitchell, MD;  Location: [facility] CATH LAB;  Service: Cardiovascular;  Laterality: N/A;  . AMPUTATION Right 12/23/2016   Procedure: AMPUTATION BELOW KNEE;  Surgeon: Serafina Mitchell, MD;  Location: [place];  Service: Vascular;  Laterality: Right;  . AORTOGRAM N/A 09/26/2016   Procedure: AORTOGRAM;  Surgeon: Waynetta Sandy, MD;  Location: [place];  Service: Vascular;  Laterality: N/A;  . CARPAL TUNNEL RELEASE Right   . CATARACT EXTRACTION W/ INTRAOCULAR LENS  IMPLANT, BILATERAL Bilateral   . COLONOSCOPY    . DILATION AND CURETTAGE OF UTERUS    . DRESSING CHANGE UNDER ANESTHESIA Right 01/16/2017   Procedure: DRESSING CHANGE RIGHT BELOW KNEE AMPUTATION;  Surgeon: Serafina Mitchell, MD;  Location: [place];  Service: Vascular;  Laterality: Right;  . EYE SURGERY Right    "macular OR"  . FEMORAL ARTERY STENT  12-11-10   Left SFA  . FEMORAL-POPLITEAL BYPASS GRAFT Left 09/24/2016   Procedure: REDO FEMORAL TO POPLITEAL ARTERY BYPASS  GRAFT USING 6MM PROPATEN RINGED GORTEX GRAFT;  Surgeon: Serafina Mitchell, MD;  Location: [place];  Service: Vascular;  Laterality: Left;  . FEMORAL-TIBIAL BYPASS GRAFT Left 01/16/2017   Procedure: REDO BYPASS GRAFT FEMORAL-TIBIAL ARTERY WITH GORTEX GRAFT;  Surgeon: Serafina Mitchell, MD;  Location: [place];  Service: Vascular;  Laterality: Left;  AND LOWER LEG   . I&D EXTREMITY Right 12/17/2016   Procedure: IRRIGATION AND DEBRIDEMENT RIGHT FOOT;  Surgeon: Serafina Mitchell, MD;  Location: [place];  Service: Vascular;  Laterality: Right;  . INCISION AND DRAINAGE OF WOUND Left 10/25/2009   leg/notes 11/13/2009  . INSERTION OF ILIAC STENT Left 12/26/2014   Procedure: INSERTION OF ILIAC STENT;  Surgeon: Serafina Mitchell, MD;  Location:  [facility] CATH LAB;  Service: Cardiovascular;  Laterality: Left;  . INSERTION OF ILIAC STENT Left 09/26/2016   Procedure: SUB INTIMAL INSERTION OF SUPERFICIAL FEMORAL ARTERY AND BELOW KNEE BYPASS GRAFT;  Surgeon: Waynetta Sandy, MD;  Location: [place];  Service: Vascular;  Laterality: Left;  . JOINT REPLACEMENT     knee  . JOINT REPLACEMENT Left Oct. 17, 2014   Elbow ( pt fell 08-31-13 )  . ORIF SHOULDER FRACTURE Right    "it was crushed"  . PERIPHERAL VASCULAR CATHETERIZATION N/A 10/30/2015   Procedure: Abdominal Aortogram w/Lower Extremity;  Surgeon: Serafina Mitchell, MD;  Location: [place] CV LAB;  Service: Cardiovascular;  Laterality: N/A;  . PERIPHERAL VASCULAR CATHETERIZATION  10/30/2015   Procedure: Peripheral Vascular Intervention;  Surgeon: Serafina Mitchell, MD;  Location: [place] CV LAB;  Service: Cardiovascular;;  . PERIPHERAL VASCULAR CATHETERIZATION N/A 04/01/2016   Procedure: Abdominal Aortogram w/Lower Extremity;  Surgeon: Serafina Mitchell, MD;  Location: [place] CV LAB;  Service: Cardiovascular;  Laterality: N/A;  . PERIPHERAL VASCULAR CATHETERIZATION Left 04/01/2016   Procedure: Peripheral Vascular Atherectomy;  Surgeon: Serafina Mitchell, MD;  Location: [place] CV LAB;  Service: Cardiovascular;  Laterality: Left;  Superficial femoral artery.  Marland Kitchen PERIPHERAL VASCULAR CATHETERIZATION Right 09/05/2016   "stent"  . PERIPHERAL VASCULAR CATHETERIZATION N/A 09/05/2016   Procedure: Abdominal Aortogram w/Lower Extremity;  Surgeon: Serafina Mitchell, MD;  Location: [place] CV LAB;  Service: Cardiovascular;  Laterality: N/A;  . PERIPHERAL VASCULAR CATHETERIZATION Right 09/05/2016   Procedure: Peripheral Vascular Intervention;  Surgeon: Serafina Mitchell, MD;  Location: [place] CV LAB;  Service: Cardiovascular;  Laterality: Right;  Superficial Femoral  . PERIPHERAL VASCULAR CATHETERIZATION Left 09/09/2016   Procedure: Lower Extremity Angiography;  Surgeon: Serafina Mitchell,  MD;  Location: [place] CV LAB;  Service: Cardiovascular;  Laterality: Left;  . PR VEIN BYPASS GRAFT,AORTO-FEM-POP  09-13-09   Left Fem-pop  . THROMBECTOMY FEMORAL ARTERY Right 09/26/2016   Procedure: Thromboembolectomy Right Lower Extremity, Right Femoral Artery Endarterectomy with Patch Angioplasty; Right Lower Extremity Angiogram ;  Surgeon: Waynetta Sandy, MD;  Location: [place];  Service: Vascular;  Laterality: Right;  . TOTAL ELBOW ARTHROPLASTY Left 09/03/2013   Procedure: LEFT TOTAL ELBOW ARTHROPLASTY;  Surgeon: Roseanne Kaufman, MD;  Location: [place];  Service: Orthopedics;  Laterality: Left;  . TOTAL KNEE ARTHROPLASTY Left 06/2006  . TRANSMETATARSAL AMPUTATION Right 12/11/2016   Procedure: TRANSMETATARSAL AMPUTATION;  Surgeon: Serafina Mitchell, MD;  Location: [place];  Service: Vascular;  Laterality: Right;  . TUBAL LIGATION    . VAGINAL HYSTERECTOMY      There were no vitals filed for this visit.  Subjective Assessment - 11/03/17 1319    Subjective  She has not walked with daughter as her dtr's husband has not been available for safety.     Pertinent History  Right TTA 12/23/16, left foot drop, Left bypass graft Oct 2010 & 2018, arthritis, A-fib, DVT, fall from stetps fx pelvis, left hip & left elbow, fx right shoulder, fibromyalgia, HTN, macular degeneration blind right eye, osteoporosis, PVD, CVA/TIA,     Limitations  Lifting;Standing;Walking;House hold activities    Patient Stated Goals  To use prosthesis to get around her house and get into car to go places like church.     Currently in Pain?  Yes    Pain Score  3     Pain Location  Leg    Pain Orientation  Right    Pain Descriptors / Indicators  Aching;Sore    Pain Type  Chronic pain    Pain Onset  More than a month ago    Pain Frequency  Constant    Aggravating Factors   arthritis, wearing AFO/prosthesis    Pain Relieving Factors  medication, rest, removing AFO/prosthesis                      [facility]  Adult PT Treatment/Exercise - 11/03/17 1315      Transfers   Transfers  Sit to Stand;Stand to [employer] Transfers    Sit to Stand  5: Supervision;With upper extremity assist;With armrests;From chair/3-in-1 to platform RW    Sit to Stand Details  Tactile cues for placement;Verbal cues for sequencing;Verbal cues for technique;Verbal cues for precautions/safety;Verbal cues for safe use of DME/AE;Manual facilitation for weight bearing;Manual facilitation for weight shifting    Stand to Sit  5: Supervision;With upper extremity assist;With armrests;To chair/3-in-1 from platform RW    Stand to Sit Details (indicate cue type and reason)  Verbal cues for sequencing;Verbal cues for technique;Verbal cues for safe use of DME/AE;Manual facilitation for weight shifting;Verbal cues for precautions/safety    Stand Pivot Transfers  4: Min assist;3: Mod assist MinA transfering w/c to car & ModA to get onto seat    Stand Pivot Transfer Details (indicate cue type and reason)  Car/SUV higher seat: PT demo, instructed technique prior. Initially attempted without blocks but she is too short to get buttocks onto seat. Then attempted to step up on 4" block but pt unable to step onto block.  Then PT placed a 2" block under RLE/prosthesis once pt in position at car. Pt required modA but able to get onto seat.        Ambulation/Gait   Ambulation/Gait  Yes    Ambulation/Gait Assistance  4: Min assist    Ambulation/Gait Assistance Details  Dtr performed assist under PT supervision. PT corrected her hand placement to stabilize left hip otherwise appeared safe.     Ambulation Distance (Feet)  75 Feet    Assistive device  Left platform walker;Prosthesis;Other (Comment) AFO    Gait Pattern  Step-through pattern;Decreased step length - left;Decreased stride length;Decreased weight shift to left    Ambulation Surface  Indoor;Level      Prosthetics   Prosthetic Care Comments   Pt & dtr verbalize proper prosthetic care. Dtr  demonstrated proper donning of prosthesis & AFO.  PT called CPO to check on shoes per pt request. Amy, CPO is checking on status & will get back to [place].     Current prosthetic wear tolerance (days/week)   daily    Current prosthetic wear tolerance (#hours/day)   Pt wearing liner most of awake  hours but has increasing limb pain. Wears prosthesis 3hrs 2x/day, wears AFO 1-2 hrs 1-2x/day.     Residual limb condition   RLE with tenderness, pitting edema in BLEs with dependent position    Education Provided  --               PT Short Term Goals - 10/07/17 1148      PT SHORT TERM GOAL #1   Title  Patient & dtr demonstrate proper donning of prosthesis & AFO. (All STGs Target Date 10/07/2017)    Baseline  MET 10/06/2017    Time  4    Period  Weeks    Status  Achieved      PT SHORT TERM GOAL #2   Title  Patient tolerates wear of prosthesis & AFO >5 hrs total/day with skin issues.     Baseline  Partially MET 10/06/2017  She tolerates wear of prosthetic liner >8 hrs total /day, prosthesis >5hrs total & AFO 2-4hrs total/day.     Time  4    Period  Weeks    Status  Partially Met      PT SHORT TERM GOAL #3   Title  Patient sit to/from stand w/c to RW with minA.    Baseline  MET 10/06/2017    Time  4    Period  Weeks    Status  Achieved      PT SHORT TERM GOAL #4   Title  Patient able to stand with RW support with prosthesis & RW for 2 minutes with supervision.     Baseline  MET 10/06/2017    Time  4    Period  Weeks    Status  Achieved      PT SHORT TERM GOAL #5   Title  Patient ambulates 47' with RW, prosthesis & AFO with modA.    Baseline  MET 10/06/2017    Time  4    Period  Weeks    Status  Achieved        PT Long Term Goals - 11/03/17 1857      PT LONG TERM GOAL #1   Title  Patient's daughter & patient verbalize proper prosthetic care. (All LTGs Target Date 11/06/2017)    Baseline  MET 11/03/2017    Time  8    Period  Weeks    Status  Achieved      PT LONG TERM  GOAL #2   Title  Patient tolerates wear of TTA prosthesis & AFO >50% of awake hours without skin issues.     Baseline  Partially MET 11/03/2017  Patient wears liner most of awake hours to enable quicker donning of prosthesis, wears prosthesis 3hrs 1-2x/day and AFO 1-2hrs 1-2x/day    Time  8    Period  Weeks    Status  Partially Met      PT LONG TERM GOAL #3   Title  Patient performs sit to/from stand transfers chairs with armrests to RW with supervision.     Baseline  MET 11/03/2017    Time  8    Period  Weeks    Status  Achieved      PT LONG TERM GOAL #4   Title  Patient standing balance with prosthesis, AFO & RW reaching 5" anterior to RW, scanning environment with head turns and managing pants for toileting with minA.     Time  8    Period  Weeks    Status  On-going  Target Date  11/06/17      PT LONG TERM GOAL #5   Title  Patient ambulates 21' with RW, prosthesis & AFO with minA.    Baseline  MET 11/03/2017    Time  8    Period  Weeks    Status  Achieved            Plan - 11/03/17 1902    Clinical Impression Statement  Patient met or partially met 4 of 5 STGs. Her daughter & she would like ability to transfer into SUV without having to load & unloading wooden ramp to make w/c even height with SUV seat. Pt was able to perform with assist using modified technique but needs additional skilled training.  Patient & dtr verbalized wanting to possibly address ramps to enable some limited community access including their home without w/c. They will further discuss before next session to determine if PT is discharging or recertifying.     Rehab Potential  Good    Clinical Impairments Affecting Rehab Potential  Right TTA 12/23/16, left foot drop, L bypass graft Oct 2010, arthritis, A-fib, DVT, Blood clot LLE foot drop, R TTA, fall from stetps fx pelvis, left hip & left elbow, fibromyalgia, HTN, macular degeneration blind right eye, osteoporosis, PVD, CVA, TIA, right elbow replacement,  chronic LBP,     PT Frequency  2x / week    PT Duration  8 weeks    PT Treatment/Interventions  ADLs/Self Care Home Management;Moist Heat;DME Instruction;Gait training;Stair training;Functional mobility training;Therapeutic activities;Therapeutic exercise;Balance training;Neuromuscular re-education;Patient/family education;Orthotic Fit/Training;Prosthetic Training;Vestibular    PT Next Visit Plan  assess STG for balance, discuss patient's goals to determine discharge vs recertification, car transfers with platform RW with 2" step under prosthesis.     Consulted and Agree with Plan of Care  Patient;Family member/caregiver    Family Member Consulted  daughter       Patient will benefit from skilled therapeutic intervention in order to improve the following deficits and impairments:  Abnormal gait, Cardiopulmonary status limiting activity, Decreased activity tolerance, Decreased balance, Decreased endurance, Decreased knowledge of use of DME, Decreased range of motion, Decreased mobility, Decreased skin integrity, Decreased strength, Difficulty walking, Increased edema, Impaired flexibility, Postural dysfunction, Pain, Prosthetic Dependency, Dizziness  Visit Diagnosis: Muscle weakness (generalized)  Unsteadiness on feet  Other abnormalities of gait and mobility  Pain in left leg  Pain in right leg  Contracture of muscle, multiple sites     Problem List Patient Active Problem List   Diagnosis Date Noted  . Plantar fasciitis of left foot 06/10/2017  . Idiopathic chronic venous hypertension of both lower extremities with inflammation 05/21/2017  . Foot drop, left 05/21/2017  . Decubitus ulcer of sacral region, unstageable ([place])   . Deep tissue injury   . Hypokalemia   . Hypoalbuminemia due to protein-calorie malnutrition ([place])   . Debilitated 01/21/2017  . Neuropathic pain   . Post-operative pain   . Slow transit constipation   . Debility   . Ischemic leg   . PAF (paroxysmal  atrial fibrillation) ([place])   . Anemia of chronic disease   . Chronic pain syndrome   . Fibromyalgia   . Stage 3 chronic kidney disease ([place])   . Benign essential HTN   . Abnormal urinalysis   . PVD (peripheral vascular disease) ([place]) 01/13/2017  . Post-op pain 01/03/2017  . Phantom limb pain ([place]) 01/03/2017  . S/P unilateral BKA (below knee amputation), right ([place]) 12/30/2016  . Acute on chronic  combined systolic and diastolic CHF (congestive heart failure) ([place]) 12/22/2016  . Anemia 12/22/2016  . Palliative care encounter   . Lower extremity pain, right   . Acute GI bleeding 10/23/2016  . Paroxysmal atrial fibrillation ([place]) 10/03/2016  . Atherosclerosis of autologous vein bypass graft of left lower extremity with rest pain ([place]) 09/24/2016  . PAD (peripheral artery disease) ([place]) 09/05/2016  . Groin pain 04/02/2015  . Aftercare following surgery of the circulatory system, [place] 12/13/2013  . Shingles 10/26/2013  . Anxiety 10/26/2013  . Acute blood loss anemia 09/05/2013  . Elbow fracture, left 09/05/2013  . Left acetabular fracture ([place]) 09/05/2013  . Ankle fracture, left 09/05/2013  . HTN (hypertension) 09/03/2013  . HLD (hyperlipidemia) 09/03/2013  . Peripheral vascular disease, unspecified ([place]) 05/10/2012  . Chronic total occlusion of artery of the extremities ([place]) 01/19/2012    Danetra Glock PT, DPT 11/04/2017, 9:17 AM  [facility] [address] [place], [place], [address] Phone: [phone]   Fax:  [phone]  Name: [name] MRN: [id number] Date of Birth: [birth year]

## 2017-11-05 ENCOUNTER — Ambulatory Visit: Payer: Medicare Other | Admitting: Physical Therapy

## 2017-11-05 ENCOUNTER — Encounter: Payer: Self-pay | Admitting: Physical Therapy

## 2017-11-05 DIAGNOSIS — M79605 Pain in left leg: Secondary | ICD-10-CM | POA: Diagnosis not present

## 2017-11-05 DIAGNOSIS — R2681 Unsteadiness on feet: Secondary | ICD-10-CM | POA: Diagnosis not present

## 2017-11-05 DIAGNOSIS — R2689 Other abnormalities of gait and mobility: Secondary | ICD-10-CM

## 2017-11-05 DIAGNOSIS — M79604 Pain in right leg: Secondary | ICD-10-CM | POA: Diagnosis not present

## 2017-11-05 DIAGNOSIS — M6281 Muscle weakness (generalized): Secondary | ICD-10-CM

## 2017-11-05 DIAGNOSIS — M6249 Contracture of muscle, multiple sites: Secondary | ICD-10-CM | POA: Diagnosis not present

## 2017-11-05 NOTE — Therapy (Signed)
[facility] [address] [place], [place], [address] Phone: [phone]   Fax:  [phone]  Physical Therapy Treatment  Patient Details  Name: [name] MRN: [id number] Date of Birth: [birth year] Referring Provider: Alger Simons, MD  (dtr requested use of Dr. Naaman Plummer for referral) [name], NP & [name], MD)   Encounter Date: 11/05/2017  PT End of Session - 11/05/17 1323    Visit Number  13    Number of Visits  17    Date for PT Re-Evaluation  11/06/17    Authorization Type  Medicare G-code & progress note every 10th visit    PT Start Time  1319    PT Stop Time  1353 left early due to issues with AFO    PT Time Calculation (min)  34 min    Equipment Utilized During Treatment  Gait belt    Activity Tolerance  Patient limited by pain;Patient limited by fatigue;Patient tolerated treatment well    Behavior During Therapy  [facility] for tasks assessed/performed       Past Medical History:  Diagnosis Date  . Anemia   . Anginal pain ([place])   . Arthritis    "qwhere" (09/05/2016)  . Atrial fibrillation ([place]) 09/2016  . Chronic lower back pain   . Complication of anesthesia    "takes a long time for it to wear off; I can hallucinate if I take too much" (09/05/2016)  . DVT (deep venous thrombosis) ([place]) 10/2009  . Fall from steps 08/31/2013   Fx. pelvis, Left Hip, Left Elbow  . Fibromyalgia   . GERD (gastroesophageal reflux disease)    09/22/16- "no too much anymore"  . GI bleed 10/24/2016  . High cholesterol   . History of blood transfusion   . History of hiatal hernia   . Hypertension   . Macular degeneration, wet ([place])    "started in right eye; now legally blind in that eye; now started in left eye but pretty much in control" (09/05/2016)  . Osteoporosis   . Peripheral vascular disease ([place])    nonviable tissue Right foot  . PONV (postoperative nausea and vomiting)   . Squamous cell carcinoma of  skin of right calf Aug. 2015  . Stroke ([place])    TIA's no residual  . TIA (transient ischemic attack)    "several at once; none in a long time" (09/05/2016)    Past Surgical History:  Procedure Laterality Date  . ABDOMINAL AORTAGRAM N/A 12/26/2014   Procedure: ABDOMINAL Maxcine Ham;  Surgeon: Serafina Mitchell, MD;  Location: [facility] CATH LAB;  Service: Cardiovascular;  Laterality: N/A;  . AMPUTATION Right 12/23/2016   Procedure: AMPUTATION BELOW KNEE;  Surgeon: Serafina Mitchell, MD;  Location: [facility];  Service: Vascular;  Laterality: Right;  . AORTOGRAM N/A 09/26/2016   Procedure: AORTOGRAM;  Surgeon: Waynetta Sandy, MD;  Location: [facility];  Service: Vascular;  Laterality: N/A;  . CARPAL TUNNEL RELEASE Right   . CATARACT EXTRACTION W/ INTRAOCULAR LENS  IMPLANT, BILATERAL Bilateral   . COLONOSCOPY    . DILATION AND CURETTAGE OF UTERUS    . DRESSING CHANGE UNDER ANESTHESIA Right 01/16/2017   Procedure: DRESSING CHANGE RIGHT BELOW KNEE AMPUTATION;  Surgeon: Serafina Mitchell, MD;  Location: [facility];  Service: Vascular;  Laterality: Right;  . EYE SURGERY Right    "macular OR"  . FEMORAL ARTERY STENT  12-11-10   Left SFA  . FEMORAL-POPLITEAL BYPASS GRAFT Left 09/24/2016  Procedure: REDO FEMORAL TO POPLITEAL ARTERY BYPASS GRAFT USING 6MM PROPATEN RINGED GORTEX GRAFT;  Surgeon: Serafina Mitchell, MD;  Location: [facility];  Service: Vascular;  Laterality: Left;  . FEMORAL-TIBIAL BYPASS GRAFT Left 01/16/2017   Procedure: REDO BYPASS GRAFT FEMORAL-TIBIAL ARTERY WITH GORTEX GRAFT;  Surgeon: Serafina Mitchell, MD;  Location: [facility];  Service: Vascular;  Laterality: Left;  AND LOWER LEG   . I&D EXTREMITY Right 12/17/2016   Procedure: IRRIGATION AND DEBRIDEMENT RIGHT FOOT;  Surgeon: Serafina Mitchell, MD;  Location: [facility];  Service: Vascular;  Laterality: Right;  . INCISION AND DRAINAGE OF WOUND Left 10/25/2009   leg/notes 11/13/2009  . INSERTION OF ILIAC STENT Left 12/26/2014   Procedure: INSERTION OF ILIAC STENT;  Surgeon:  Serafina Mitchell, MD;  Location: [facility] CATH LAB;  Service: Cardiovascular;  Laterality: Left;  . INSERTION OF ILIAC STENT Left 09/26/2016   Procedure: SUB INTIMAL INSERTION OF SUPERFICIAL FEMORAL ARTERY AND BELOW KNEE BYPASS GRAFT;  Surgeon: Waynetta Sandy, MD;  Location: [facility];  Service: Vascular;  Laterality: Left;  . JOINT REPLACEMENT     knee  . JOINT REPLACEMENT Left Oct. 17, 2014   Elbow ( pt fell 08-31-13 )  . ORIF SHOULDER FRACTURE Right    "it was crushed"  . PERIPHERAL VASCULAR CATHETERIZATION N/A 10/30/2015   Procedure: Abdominal Aortogram w/Lower Extremity;  Surgeon: Serafina Mitchell, MD;  Location: [facility] CV LAB;  Service: Cardiovascular;  Laterality: N/A;  . PERIPHERAL VASCULAR CATHETERIZATION  10/30/2015   Procedure: Peripheral Vascular Intervention;  Surgeon: Serafina Mitchell, MD;  Location: [facility] CV LAB;  Service: Cardiovascular;;  . PERIPHERAL VASCULAR CATHETERIZATION N/A 04/01/2016   Procedure: Abdominal Aortogram w/Lower Extremity;  Surgeon: Serafina Mitchell, MD;  Location: [place] CV LAB;  Service: Cardiovascular;  Laterality: N/A;  . PERIPHERAL VASCULAR CATHETERIZATION Left 04/01/2016   Procedure: Peripheral Vascular Atherectomy;  Surgeon: Serafina Mitchell, MD;  Location: [facility] CV LAB;  Service: Cardiovascular;  Laterality: Left;  Superficial femoral artery.  Marland Kitchen PERIPHERAL VASCULAR CATHETERIZATION Right 09/05/2016   "stent"  . PERIPHERAL VASCULAR CATHETERIZATION N/A 09/05/2016   Procedure: Abdominal Aortogram w/Lower Extremity;  Surgeon: Serafina Mitchell, MD;  Location: [facility] CV LAB;  Service: Cardiovascular;  Laterality: N/A;  . PERIPHERAL VASCULAR CATHETERIZATION Right 09/05/2016   Procedure: Peripheral Vascular Intervention;  Surgeon: Serafina Mitchell, MD;  Location: [facility] CV LAB;  Service: Cardiovascular;  Laterality: Right;  Superficial Femoral  . PERIPHERAL VASCULAR CATHETERIZATION Left 09/09/2016   Procedure: Lower Extremity  Angiography;  Surgeon: Serafina Mitchell, MD;  Location: [facility] CV LAB;  Service: Cardiovascular;  Laterality: Left;  . PR VEIN BYPASS GRAFT,AORTO-FEM-POP  09-13-09   Left Fem-pop  . THROMBECTOMY FEMORAL ARTERY Right 09/26/2016   Procedure: Thromboembolectomy Right Lower Extremity, Right Femoral Artery Endarterectomy with Patch Angioplasty; Right Lower Extremity Angiogram ;  Surgeon: Waynetta Sandy, MD;  Location: [facility];  Service: Vascular;  Laterality: Right;  . TOTAL ELBOW ARTHROPLASTY Left 09/03/2013   Procedure: LEFT TOTAL ELBOW ARTHROPLASTY;  Surgeon: Roseanne Kaufman, MD;  Location: [facility];  Service: Orthopedics;  Laterality: Left;  . TOTAL KNEE ARTHROPLASTY Left 06/2006  . TRANSMETATARSAL AMPUTATION Right 12/11/2016   Procedure: TRANSMETATARSAL AMPUTATION;  Surgeon: Serafina Mitchell, MD;  Location: [facility];  Service: Vascular;  Laterality: Right;  . TUBAL LIGATION    . VAGINAL HYSTERECTOMY      There were no vitals filed for this visit.  Subjective Assessment -  11/05/17 1321    Subjective  No falls. Did travel to [place], which is the furtherest she's been in the car in awhile and her legs are swollen. Got back late last night.     Patient is accompained by:  Family member    Pertinent History  Right TTA 12/23/16, left foot drop, Left bypass graft Oct 2010 & 2018, arthritis, A-fib, DVT, fall from stetps fx pelvis, left hip & left elbow, fx right shoulder, fibromyalgia, HTN, macular degeneration blind right eye, osteoporosis, PVD, CVA/TIA,     Limitations  Lifting;Standing;Walking;House hold activities    Patient Stated Goals  To use prosthesis to get around her house and get into car to go places like church.     Currently in Pain?  Yes    Pain Score  5     Pain Location  Leg    Pain Orientation  Right    Pain Descriptors / Indicators  Aching;Sore    Pain Type  Chronic pain    Pain Onset  More than a month ago    Pain Frequency  Constant    Aggravating Factors   arthritis,  wearing AFO/prosthesis    Pain Relieving Factors  medication, rest, removing AFO/prosthesis          [facility] Adult PT Treatment/Exercise - 11/05/17 1331      Transfers   Transfers  Sit to Stand;Stand to Sit    Sit to Stand  5: Supervision;With upper extremity assist;With armrests;From chair/3-in-1    Stand to Sit  5: Supervision;With upper extremity assist;With armrests;To chair/3-in-1      Ambulation/Gait   Ambulation/Gait  Yes    Ambulation/Gait Assistance  4: Min assist    Ambulation/Gait Assistance Details  limited gait due to lateral bar of AFO coming out of shoe with standing/weight bearing/gait.     Ambulation Distance (Feet)  8 Feet    Assistive device  Left platform walker;Prosthesis;Other (Comment) right AFO    Gait Pattern  Step-through pattern;Decreased step length - left;Decreased stride length;Decreased weight shift to left    Ambulation Surface  Level;Indoor      Neuro Re-ed    Neuro Re-ed Details   standing with left PFRW support: pt able to reach fwd at least 5 inches and to just below her knees with right UE, left UE on platform. also at [place] pt was able to look left and right with no loss of balance.       Prosthetics   Current prosthetic wear tolerance (days/week)   daily    Current prosthetic wear tolerance (#hours/day)   Pt wearing liner most of awake hours but has increasing limb pain. Wears prosthesis 3hrs 2x/day, wears AFO 1-2 hrs 1-2x/day.     Residual limb condition   RLE with tenderness, pitting edema in BLEs with dependent position    Education Provided  Residual limb care;Proper Donning;Proper Doffing;Proper weight-bearing schedule/adjustment;Proper wear schedule/adjustment    Person(s) Educated  Patient;Child(ren)    Education Method  Explanation;Demonstration    Education Method  Verbalized understanding    Donning Prosthesis  Moderate assist    Doffing Prosthesis  Minimal assist;Supervision          PT Short Term Goals - 10/07/17 1148      PT  SHORT TERM GOAL #1   Title  Patient & dtr demonstrate proper donning of prosthesis & AFO. (All STGs Target Date 10/07/2017)    Baseline  MET 10/06/2017    Time  4    Period  Weeks    Status  Achieved      PT SHORT TERM GOAL #2   Title  Patient tolerates wear of prosthesis & AFO >5 hrs total/day with skin issues.     Baseline  Partially MET 10/06/2017  She tolerates wear of prosthetic liner >8 hrs total /day, prosthesis >5hrs total & AFO 2-4hrs total/day.     Time  4    Period  Weeks    Status  Partially Met      PT SHORT TERM GOAL #3   Title  Patient sit to/from stand w/c to RW with minA.    Baseline  MET 10/06/2017    Time  4    Period  Weeks    Status  Achieved      PT SHORT TERM GOAL #4   Title  Patient able to stand with RW support with prosthesis & RW for 2 minutes with supervision.     Baseline  MET 10/06/2017    Time  4    Period  Weeks    Status  Achieved      PT SHORT TERM GOAL #5   Title  Patient ambulates 1' with RW, prosthesis & AFO with modA.    Baseline  MET 10/06/2017    Time  4    Period  Weeks    Status  Achieved        PT Long Term Goals - 11/05/17 1331      PT LONG TERM GOAL #1   Title  Patient's daughter & patient verbalize proper prosthetic care. (All LTGs Target Date 11/06/2017)    Baseline  MET 11/03/2017    Status  Achieved      PT LONG TERM GOAL #2   Title  Patient tolerates wear of TTA prosthesis & AFO >50% of awake hours without skin issues.     Baseline  Partially MET 11/03/2017  Patient wears liner most of awake hours to enable quicker donning of prosthesis, wears prosthesis 3hrs 1-2x/day and AFO 1-2hrs 1-2x/day    Status  Partially Met      PT LONG TERM GOAL #3   Title  Patient performs sit to/from stand transfers chairs with armrests to RW with supervision.     Baseline  MET 11/03/2017    Status  Achieved      PT LONG TERM GOAL #4   Title  Patient standing balance with prosthesis, AFO & RW reaching 5" anterior to RW, scanning  environment with head turns and managing pants for toileting with minA.     Baseline  11/05/17:  met today    Status  Achieved      PT LONG TERM GOAL #5   Title  Patient ambulates 34' with RW, prosthesis & AFO with minA.    Baseline  MET 11/03/2017    Status  Achieved            Plan - 11/05/17 1323    Clinical Impression Statement  Pt has met remaining LTG. On discussion with pt, her daughter and primary PT was decided to place pt on hold until she gets her new shoes/AFO. At that time she will return to PT to see if new shoes assist with decreased pain/discomfort with gait. Today's session was limited due to pt's current upright brace bracket was coming out of shoe with any weight bearing. Call placed to orthotist office and pt's daughter is to take to the clinic for adjustments.     Rehab Potential  Good    Clinical Impairments Affecting Rehab Potential  Right TTA 12/23/16, left foot drop, L bypass graft Oct 2010, arthritis, A-fib, DVT, Blood clot LLE foot drop, R TTA, fall from stetps fx pelvis, left hip & left elbow, fibromyalgia, HTN, macular degeneration blind right eye, osteoporosis, PVD, CVA, TIA, right elbow replacement, chronic LBP,     PT Frequency  2x / week    PT Duration  8 weeks    PT Treatment/Interventions  ADLs/Self Care Home Management;Moist Heat;DME Instruction;Gait training;Stair training;Functional mobility training;Therapeutic activities;Therapeutic exercise;Balance training;Neuromuscular re-education;Patient/family education;Orthotic Fit/Training;Prosthetic Training;Vestibular    PT Next Visit Plan  PT to renew goals, pt on hold until she gets her new shoes/AFO    Consulted and Agree with Plan of Care  Patient;Family member/caregiver    Family Member Consulted  daughter       Patient will benefit from skilled therapeutic intervention in order to improve the following deficits and impairments:  Abnormal gait, Cardiopulmonary status limiting activity, Decreased  activity tolerance, Decreased balance, Decreased endurance, Decreased knowledge of use of DME, Decreased range of motion, Decreased mobility, Decreased skin integrity, Decreased strength, Difficulty walking, Increased edema, Impaired flexibility, Postural dysfunction, Pain, Prosthetic Dependency, Dizziness  Visit Diagnosis: Muscle weakness (generalized)  Unsteadiness on feet  Other abnormalities of gait and mobility  Pain in left leg     Problem List Patient Active Problem List   Diagnosis Date Noted  . Plantar fasciitis of left foot 06/10/2017  . Idiopathic chronic venous hypertension of both lower extremities with inflammation 05/21/2017  . Foot drop, left 05/21/2017  . Decubitus ulcer of sacral region, unstageable ([place])   . Deep tissue injury   . Hypokalemia   . Hypoalbuminemia due to protein-calorie malnutrition ([place])   . Debilitated 01/21/2017  . Neuropathic pain   . Post-operative pain   . Slow transit constipation   . Debility   . Ischemic leg   . PAF (paroxysmal atrial fibrillation) ([place])   . Anemia of chronic disease   . Chronic pain syndrome   . Fibromyalgia   . Stage 3 chronic kidney disease ([place])   . Benign essential HTN   . Abnormal urinalysis   . PVD (peripheral vascular disease) ([place]) 01/13/2017  . Post-op pain 01/03/2017  . Phantom limb pain ([place]) 01/03/2017  . S/P unilateral BKA (below knee amputation), right ([place]) 12/30/2016  . Acute on chronic combined systolic and diastolic CHF (congestive heart failure) ([place]) 12/22/2016  . Anemia 12/22/2016  . Palliative care encounter   . Lower extremity pain, right   . Acute GI bleeding 10/23/2016  . Paroxysmal atrial fibrillation ([place]) 10/03/2016  . Atherosclerosis of autologous vein bypass graft of left lower extremity with rest pain ([place]) 09/24/2016  . PAD (peripheral artery disease) ([place]) 09/05/2016  . Groin pain 04/02/2015  . Aftercare following surgery of the circulatory system, [place] 12/13/2013  .  Shingles 10/26/2013  . Anxiety 10/26/2013  . Acute blood loss anemia 09/05/2013  . Elbow fracture, left 09/05/2013  . Left acetabular fracture ([place]) 09/05/2013  . Ankle fracture, left 09/05/2013  . HTN (hypertension) 09/03/2013  . HLD (hyperlipidemia) 09/03/2013  . Peripheral vascular disease, unspecified ([place]) 05/10/2012  . Chronic total occlusion of artery of the extremities ([facility]) 01/19/2012    Willow Ora, PTA, [facility] [address] [place], [place] [address] [phone] 11/06/17, 11:35 AM   Name: [name] MRN: [id number] Date of Birth: 1927-11-26

## 2017-11-23 ENCOUNTER — Ambulatory Visit: Payer: Medicare Other | Attending: Family | Admitting: Physical Therapy

## 2017-11-23 ENCOUNTER — Encounter: Payer: Self-pay | Admitting: Physical Therapy

## 2017-11-23 DIAGNOSIS — M79604 Pain in right leg: Secondary | ICD-10-CM | POA: Diagnosis not present

## 2017-11-23 DIAGNOSIS — R2689 Other abnormalities of gait and mobility: Secondary | ICD-10-CM

## 2017-11-23 DIAGNOSIS — M79605 Pain in left leg: Secondary | ICD-10-CM | POA: Diagnosis not present

## 2017-11-23 DIAGNOSIS — R2681 Unsteadiness on feet: Secondary | ICD-10-CM | POA: Diagnosis not present

## 2017-11-23 DIAGNOSIS — M6249 Contracture of muscle, multiple sites: Secondary | ICD-10-CM | POA: Diagnosis not present

## 2017-11-23 DIAGNOSIS — M6281 Muscle weakness (generalized): Secondary | ICD-10-CM | POA: Insufficient documentation

## 2017-11-24 NOTE — Therapy (Signed)
[facility] [address] [place], [place], [address] Phone: [phone]   Fax:  [phone]  Physical Therapy Treatment  Patient Details  Name: [name] MRN: [id number] Date of Birth: Jan 24, 1928 Referring Provider: Alger Simons, MD  (dtr requested use of Dr. Naaman Plummer for referral) [name], NP & [name], MD)   Encounter Date: 11/23/2017  PT End of Session - 11/23/17 1935    Visit Number  14    Number of Visits  21    Date for PT Re-Evaluation  01/22/18    PT Start Time  1230    PT Stop Time  1315    PT Time Calculation (min)  45 min    Equipment Utilized During Treatment  Gait belt    Activity Tolerance  Patient limited by pain;Patient limited by fatigue;Patient tolerated treatment well    Behavior During Therapy  [facility] for tasks assessed/performed       Past Medical History:  Diagnosis Date  . Anemia   . Anginal pain ([place])   . Arthritis    "qwhere" (09/05/2016)  . Atrial fibrillation ([place]) 09/2016  . Chronic lower back pain   . Complication of anesthesia    "takes a long time for it to wear off; I can hallucinate if I take too much" (09/05/2016)  . DVT (deep venous thrombosis) ([place]) 10/2009  . Fall from steps 08/31/2013   Fx. pelvis, Left Hip, Left Elbow  . Fibromyalgia   . GERD (gastroesophageal reflux disease)    09/22/16- "no too much anymore"  . GI bleed 10/24/2016  . High cholesterol   . History of blood transfusion   . History of hiatal hernia   . Hypertension   . 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 Osteoporosis   . Peripheral vascular disease ([place])    nonviable tissue Right foot  . PONV (postoperative nausea and vomiting)   . Squamous cell carcinoma of skin of right calf Aug. 2015  . Stroke ([place])    TIA's no residual  . TIA (transient ischemic attack)    "several at once; none in a long time" (09/05/2016)    Past Surgical History:  Procedure Laterality Date  . ABDOMINAL AORTAGRAM N/A 12/26/2014   Procedure: ABDOMINAL Maxcine Ham;  Surgeon: Serafina Mitchell, MD;  Location: [facility] CATH LAB;  Service: Cardiovascular;  Laterality: N/A;  . AMPUTATION Right 12/23/2016   Procedure: AMPUTATION BELOW KNEE;  Surgeon: Serafina Mitchell, MD;  Location: [facility];  Service: Vascular;  Laterality: Right;  . AORTOGRAM N/A 09/26/2016   Procedure: AORTOGRAM;  Surgeon: Waynetta Sandy, MD;  Location: [place];  Service: Vascular;  Laterality: N/A;  . CARPAL TUNNEL RELEASE Right   . CATARACT EXTRACTION W/ INTRAOCULAR LENS  IMPLANT, BILATERAL Bilateral   . COLONOSCOPY    . DILATION AND CURETTAGE OF UTERUS    . DRESSING CHANGE UNDER ANESTHESIA Right 01/16/2017   Procedure: DRESSING CHANGE RIGHT BELOW KNEE AMPUTATION;  Surgeon: Serafina Mitchell, MD;  Location: [facility];  Service: Vascular;  Laterality: Right;  . EYE SURGERY Right    "macular OR"  . FEMORAL ARTERY STENT  12-11-10   Left SFA  . FEMORAL-POPLITEAL BYPASS GRAFT Left 09/24/2016   Procedure: REDO FEMORAL TO POPLITEAL ARTERY BYPASS GRAFT USING 6MM PROPATEN RINGED GORTEX GRAFT;  Surgeon: Serafina Mitchell, MD;  [facility] OR;  Service: Vascular;  Laterality: Left;  . FEMORAL-TIBIAL BYPASS GRAFT Left 01/16/2017   Procedure: REDO BYPASS GRAFT FEMORAL-TIBIAL ARTERY WITH GORTEX GRAFT;  Surgeon: Serafina Mitchell, MD;  Location: [facility];  Service: Vascular;  Laterality: Left;  AND LOWER LEG   . I&D EXTREMITY Right 12/17/2016   Procedure: IRRIGATION AND DEBRIDEMENT RIGHT FOOT;  Surgeon: Serafina Mitchell, MD;  Location: [facility];  Service: Vascular;  Laterality: Right;  . INCISION AND DRAINAGE OF WOUND Left 10/25/2009   leg/notes 11/13/2009  . INSERTION OF ILIAC STENT Left 12/26/2014   Procedure: INSERTION OF ILIAC STENT;  Surgeon: Serafina Mitchell, MD;  Location: [facility] CATH LAB;  Service: Cardiovascular;  Laterality: Left;  . INSERTION OF  ILIAC STENT Left 09/26/2016   Procedure: SUB INTIMAL INSERTION OF SUPERFICIAL FEMORAL ARTERY AND BELOW KNEE BYPASS GRAFT;  Surgeon: Waynetta Sandy, MD;  Location: [facility];  Service: Vascular;  Laterality: Left;  . JOINT REPLACEMENT     knee  . JOINT REPLACEMENT Left Oct. 17, 2014   Elbow ( pt fell 08-31-13 )  . ORIF SHOULDER FRACTURE Right    "it was crushed"  . PERIPHERAL VASCULAR CATHETERIZATION N/A 10/30/2015   Procedure: Abdominal Aortogram w/Lower Extremity;  Surgeon: Serafina Mitchell, MD;  Location: [facility] CV LAB;  Service: Cardiovascular;  Laterality: N/A;  . PERIPHERAL VASCULAR CATHETERIZATION  10/30/2015   Procedure: Peripheral Vascular Intervention;  Surgeon: Serafina Mitchell, MD;  Location: [place] CV LAB;  Service: Cardiovascular;;  . PERIPHERAL VASCULAR CATHETERIZATION N/A 04/01/2016   Procedure: Abdominal Aortogram w/Lower Extremity;  Surgeon: Serafina Mitchell, MD;  Location: [place] [facility] CV LAB;  Service: Cardiovascular;  Laterality: N/A;  . PERIPHERAL VASCULAR CATHETERIZATION Left 04/01/2016   Procedure: Peripheral Vascular Atherectomy;  Surgeon: Serafina Mitchell, MD;  Location: [place] CV LAB;  Service: Cardiovascular;  Laterality: Left;  Superficial femoral artery.  Marland Kitchen PERIPHERAL VASCULAR CATHETERIZATION Right 09/05/2016   "stent"  . PERIPHERAL VASCULAR CATHETERIZATION N/A 09/05/2016   Procedure: Abdominal Aortogram w/Lower Extremity;  Surgeon: Serafina Mitchell, MD;  Location: [place] CV LAB;  Service: Cardiovascular;  Laterality: N/A;  . PERIPHERAL VASCULAR CATHETERIZATION Right 09/05/2016   Procedure: Peripheral Vascular Intervention;  Surgeon: Serafina Mitchell, MD;  Location: [place] CV LAB;  Service: Cardiovascular;  Laterality: Right;  Superficial Femoral  . PERIPHERAL VASCULAR CATHETERIZATION Left 09/09/2016   Procedure: Lower Extremity Angiography;  Surgeon: Serafina Mitchell, MD;  Location: [place] CV LAB;  Service: Cardiovascular;  Laterality:  Left;  . PR VEIN BYPASS GRAFT,AORTO-FEM-POP  09-13-09   Left Fem-pop  . THROMBECTOMY FEMORAL ARTERY Right 09/26/2016   Procedure: Thromboembolectomy Right Lower Extremity, Right Femoral Artery Endarterectomy with Patch Angioplasty; Right Lower Extremity Angiogram ;  Surgeon: Waynetta Sandy, MD;  Location: [facility];  Service: Vascular;  Laterality: Right;  . TOTAL ELBOW ARTHROPLASTY Left 09/03/2013   Procedure: LEFT TOTAL ELBOW ARTHROPLASTY;  Surgeon: Roseanne Kaufman, MD;  Location: [place];  Service: Orthopedics;  Laterality: Left;  . TOTAL KNEE ARTHROPLASTY Left 06/2006  . TRANSMETATARSAL AMPUTATION Right 12/11/2016   Procedure: TRANSMETATARSAL AMPUTATION;  Surgeon: Serafina Mitchell, MD;  Location: [place] [facility];  Service: Vascular;  Laterality: Right;  . TUBAL LIGATION    . VAGINAL HYSTERECTOMY      There were no vitals filed for this visit.  Subjective Assessment - 11/23/17 1231    Subjective  No walking with daughter waiting on new shoe for AFO. She got new shoe Thursday  and in prosthetist office it felt good. she has not worn in last 3 days. She has worn liner daily. She has worn prosthesis ~5 days/wk for a couple of hours     Patient is accompained by:  Family member    Pertinent History  Right TTA 12/23/16, left foot drop, Left bypass graft Oct 2010 & 2018, arthritis, A-fib, DVT, fall from stetps fx pelvis, left hip & left elbow, fx right shoulder, fibromyalgia, HTN, macular degeneration blind right eye, osteoporosis, PVD, CVA/TIA,     Limitations  Lifting;Standing;Walking;House hold activities    Patient Stated Goals  To use prosthesis to get around her house and get into car to go places like church.     Currently in Pain?  Yes    Pain Score  5     Pain Location  Leg    Pain Orientation  Right    Pain Descriptors / Indicators  Aching;Sore    Pain Type  Chronic pain    Pain Onset  More than a month ago    Pain Frequency  Constant    Aggravating Factors   arthritis, wearing  AFO/prosthesis    Pain Relieving Factors  medication, rest, removing AFO/prosthesis    Pain Score  8    Pain Location  Arm    Pain Orientation  Left;Upper    Pain Descriptors / Indicators  Aching;Grimacing;Sharp    Pain Type  Chronic pain;Acute pain    Pain Onset  More than a month ago    Pain Frequency  Constant    Aggravating Factors   arm has hurt more since attempting car transfer with PT prior to Christmas.     Pain Relieving Factors  medications, resting arm,                       [facility] Adult PT Treatment/Exercise - 11/23/17 1230      Transfers   Transfers  Sit to Stand;Stand to Sit;Lateral/Scoot Transfers    Sit to Stand  4: Min assist;With upper extremity assist;With armrests;From chair/3-in-1 to platform RW    Sit to Stand Details  Tactile cues for weight shifting;Manual facilitation for weight shifting;Verbal cues for technique    Stand to Sit  5: Supervision;With upper extremity assist;With armrests;To chair/3-in-1 from platform RW    Lateral/Scoot Transfers  5: Supervision;With slide board;With armrests removed    Lateral/Scoot Transfer Details (indicate cue type and reason)  verbal cues on using prosthesis and atleast left shoe (without AFO) so LEs can assist & minimize any use of LUE for transfer.       Ambulation/Gait   Ambulation/Gait  Yes    Ambulation/Gait Assistance  4: Min assist    Ambulation/Gait Assistance Details  Verbal & tactile cues on weight shift & left lateral hip stabilization in stance.     Ambulation Distance (Feet)  35 Feet    Assistive device  Left platform walker;Prosthesis;Other (Comment) AFO LLE    Ambulation Surface  Indoor;Level      Prosthetics   Prosthetic Care Comments   Prosthesis had pre-tibial pads added by prosthetist but patient did not decrease ply socks so socket is uncomfortable as too tight. PT instructed pt & dtr in adjusting ply socks with pads. PT recommended wearing new shoe without AFO and liner during day.  Donning prosthesis for transfers. Using AFO for any gait with dtr with platform RW.  Current prosthetic wear tolerance (days/week)   daily liner, prosthesis ~5 days/wk, no AFO See prosthetic care comments    Current prosthetic wear tolerance (#hours/day)   Pt wearing liner most awake hours. Prosthesis 2-3 hrs. No AFO    Residual limb condition   Right residual limb tender, deep pink at tibial tubercle,   LLE no bruises or color changes but severe inverted position. Pt reports pain lateral side with standing & gait with new AFO. New shoe decreased pain in instep.     Education Provided  Residual limb care;Correct ply sock adjustment;Proper Donning;Proper wear schedule/adjustment;Proper weight-bearing schedule/adjustment    Person(s) Educated  Patient;Child(ren)    Education Method  Explanation;Demonstration;Tactile cues;Verbal cues    Education Method  Verbalized understanding;Needs further instruction               PT Short Term Goals - 10/07/17 1148      PT SHORT TERM GOAL #1   Title  Patient & dtr demonstrate proper donning of prosthesis & AFO. (All STGs Target Date 10/07/2017)    Baseline  MET 10/06/2017    Time  4    Period  Weeks    Status  Achieved      PT SHORT TERM GOAL #2   Title  Patient tolerates wear of prosthesis & AFO >5 hrs total/day with skin issues.     Baseline  Partially MET 10/06/2017  She tolerates wear of prosthetic liner >8 hrs total /day, prosthesis >5hrs total & AFO 2-4hrs total/day.     Time  4    Period  Weeks    Status  Partially Met      PT SHORT TERM GOAL #3   Title  Patient sit to/from stand w/c to RW with minA.    Baseline  MET 10/06/2017    Time  4    Period  Weeks    Status  Achieved      PT SHORT TERM GOAL #4   Title  Patient able to stand with RW support with prosthesis & RW for 2 minutes with supervision.     Baseline  MET 10/06/2017    Time  4    Period  Weeks    Status  Achieved      PT SHORT TERM GOAL #5    Title  Patient ambulates 6' with RW, prosthesis & AFO with modA.    Baseline  MET 10/06/2017    Time  4    Period  Weeks    Status  Achieved         11/23/17 1949  PT SHORT TERM GOAL #1  Title Patient & daughter verbalize proper ply sock adjustment with pre-tibial pads in socket. (All STGs Target Date: 12/23/2017)  Time 4  Period Weeks  Status New  Target Date 12/23/17  PT SHORT TERM GOAL #2  Title Patient reports wear of prosthetic liner & left shoe >50% of awake hours without skin issues.   Time 4  Period Weeks  Status New  Target Date 12/23/17  PT SHORT TERM GOAL #3  Title Patient performs lateral scooting transfers with sliding board with prosthesis & left shoe without use of LUE with supervision.   Time 4  Period Weeks  Status New  Target Date 12/23/17  PT SHORT TERM GOAL #4  Title Patient able to stand with RW support with prosthesis & RW for 3 minutes with supervision.   Time 4  Period Weeks  Status New  Target Date 12/23/17  PT SHORT TERM GOAL #5  Title Patient ambulates 37' with RW, prosthesis & AFO with minA.   Time 4  Period Weeks  Status Achieved    PT Long Term Goals - 11/05/17 1331      PT LONG TERM GOAL #1   Title  Patient's daughter & patient verbalize proper prosthetic care. (All LTGs Target Date 11/06/2017)    Baseline  MET 11/03/2017    Status  Achieved      PT LONG TERM GOAL #2   Title  Patient tolerates wear of TTA prosthesis & AFO >50% of awake hours without skin issues.     Baseline  Partially MET 11/03/2017  Patient wears liner most of awake hours to enable quicker donning of prosthesis, wears prosthesis 3hrs 1-2x/day and AFO 1-2hrs 1-2x/day    Status  Partially Met      PT LONG TERM GOAL #3   Title  Patient performs sit to/from stand transfers chairs with armrests to RW with supervision.     Baseline  MET 11/03/2017    Status  Achieved      PT LONG TERM GOAL #4   Title  Patient standing balance with prosthesis, AFO & RW reaching 5"  anterior to RW, scanning environment with head turns and managing pants for toileting with minA.     Baseline  11/05/17:  met today    Status  Achieved      PT LONG TERM GOAL #5   Title  Patient ambulates 101' with RW, prosthesis & AFO with minA.    Baseline  MET 11/03/2017    Status  Achieved         11/23/17 1943  PT LONG TERM GOAL #1  Title Patient's daughter & patient verbalize proper prosthetic care with new shoe & prosthetic changes. (All LTGs Target Date 01/22/2018)  Time 8  Period Weeks  Status On-going  Target Date 01/22/18  PT LONG TERM GOAL #2  Title Patient tolerates wear of TTA prosthetic liner & left shoe daily >75% of awake hours, prosthesis up to 3hrs at time & intermittently for transfers without skin issues. AFO only for gait with daughter.   Time 8  Period Weeks  Status On-going  Target Date 01/22/18  PT LONG TERM GOAL #3  Title Patient performs sit to/from stand transfers chairs with armrests to RW and scooting sliding board with prosthesis & left shoe with supervision.   Time 8  Period Weeks  Status On-going  Target Date 01/22/18  PT LONG TERM GOAL #4  Title Patient standing balance with prosthesis, AFO & RW reaching 5" anterior to RW, scanning environment with head turns and managing pants for toileting with minA.   Baseline 11/05/17:  met today  Status Achieved  PT LONG TERM GOAL #5  Title Patient ambulates 43' with RW, prosthesis & AFO with minA from daughter safely.  Time 8  Period Weeks  Status On-going        Plan - 11/23/17 1937    Clinical Impression Statement  Patient recieved new shoe for Left foot with split stirrup for bilateral upright AFO attachment / use. The new shoe decreased pain with weightbearing & gait at instep but continued with lateral ankle pain with severe supination.  PT assessed sliding board transfers with shoe only (no AFO) and shoe improved ability for more plantargrade position with partial weight bearing. Use of  prosthesis & left shoe for transfers decreased use of LUE that she is not supposed to use. patient  appears to need additional skilled care to progress mobility & safety with prosthesis & AFO. With changes made to prosthesis & Left shoe, patient & daughter need skilled instruction for skin & pain management.                        Rehab Potential  Good    Clinical Impairments Affecting Rehab Potential  Right TTA 12/23/16, left foot drop, L bypass graft Oct 2010, arthritis, A-fib, DVT, Blood clot LLE foot drop, R TTA, fall from stetps fx pelvis, left hip & left elbow, fibromyalgia, HTN, macular degeneration blind right eye, osteoporosis, PVD, CVA, TIA, right elbow replacement, chronic LBP,     PT Frequency  1x / week    PT Duration  8 weeks    PT Treatment/Interventions  ADLs/Self Care Home Management;Moist Heat;DME Instruction;Gait training;Stair training;Functional mobility training;Therapeutic activities;Therapeutic exercise;Balance training;Neuromuscular re-education;Patient/family education;Orthotic Fit/Training;Prosthetic Training;Vestibular    PT Next Visit Plan  progress transfers & gait with prosthesis & new shoe with AFO    Consulted and Agree with Plan of Care  Patient;Family member/caregiver    Family Member Consulted  daughter       Patient will benefit from skilled therapeutic intervention in order to improve the following deficits and impairments:  Abnormal gait, Cardiopulmonary status limiting activity, Decreased activity tolerance, Decreased balance, Decreased endurance, Decreased knowledge of use of DME, Decreased range of motion, Decreased mobility, Decreased skin integrity, Decreased strength, Difficulty walking, Increased edema, Impaired flexibility, Postural dysfunction, Pain, Prosthetic Dependency, Dizziness  Visit Diagnosis: Muscle weakness (generalized)  Unsteadiness on feet  Other abnormalities of gait and mobility  Pain in left leg  Pain in right leg  Contracture of  muscle, multiple sites     Problem List Patient Active Problem List   Diagnosis Date Noted  . Plantar fasciitis of left foot 06/10/2017  . Idiopathic chronic venous hypertension of both lower extremities with inflammation 05/21/2017  . Foot drop, left 05/21/2017  . Decubitus ulcer of sacral region, unstageable ([place])   . Deep tissue injury   . Hypokalemia   . Hypoalbuminemia due to protein-calorie malnutrition ([place])   . Debilitated 01/21/2017  . Neuropathic pain   . Post-operative pain   . Slow transit constipation   . Debility   . Ischemic leg   . PAF (paroxysmal atrial fibrillation) ([place])   . Anemia of chronic disease   . Chronic pain syndrome   . Fibromyalgia   . Stage 3 chronic kidney disease ([place])   . Benign essential HTN   . Abnormal urinalysis   . PVD (peripheral vascular disease) ([place]) 01/13/2017  . Post-op pain 01/03/2017  . Phantom limb pain ([place]) 01/03/2017  . S/P unilateral BKA (below knee amputation), right ([place]) 12/30/2016  . Acute on chronic combined systolic and diastolic CHF (congestive heart failure) ([place]) 12/22/2016  . Anemia 12/22/2016  . Palliative care encounter   . Lower extremity pain, right   . Acute GI bleeding 10/23/2016  . Paroxysmal atrial fibrillation ([place]) 10/03/2016  . Atherosclerosis of autologous vein bypass graft of left lower extremity with rest pain ([place]) 09/24/2016  . PAD (peripheral artery disease) ([place]) 09/05/2016  . Groin pain 04/02/2015  . Aftercare following surgery of the circulatory system, [place] 12/13/2013  . Shingles 10/26/2013  . Anxiety 10/26/2013  . Acute blood loss anemia 09/05/2013  . Elbow fracture, left 09/05/2013  . Left acetabular fracture ([place]) 09/05/2013  . Ankle fracture, left 09/05/2013  . HTN (hypertension) 09/03/2013  .  HLD (hyperlipidemia) 09/03/2013  . Peripheral vascular disease, unspecified ([place]) 05/10/2012  . Chronic total occlusion of artery of the extremities ([place]) 01/19/2012    Cranford Blessinger PT,  DPT 11/24/2017, 9:43 AM  [facility] [address] [place], [place], [address] Phone: [phone]   Fax:  [phone]  Name: KATHEEN ASLIN MRN: [id number] Date of Birth: [birth year]

## 2017-11-25 ENCOUNTER — Encounter: Payer: Medicare Other | Admitting: Physical Therapy

## 2017-11-30 ENCOUNTER — Encounter: Payer: Medicare Other | Admitting: Physical Therapy

## 2017-12-02 ENCOUNTER — Encounter: Payer: Self-pay | Admitting: Physical Therapy

## 2017-12-02 ENCOUNTER — Ambulatory Visit: Payer: Medicare Other | Admitting: Physical Therapy

## 2017-12-02 DIAGNOSIS — R2689 Other abnormalities of gait and mobility: Secondary | ICD-10-CM

## 2017-12-02 DIAGNOSIS — R2681 Unsteadiness on feet: Secondary | ICD-10-CM | POA: Diagnosis not present

## 2017-12-02 DIAGNOSIS — M79605 Pain in left leg: Secondary | ICD-10-CM | POA: Diagnosis not present

## 2017-12-02 DIAGNOSIS — M6281 Muscle weakness (generalized): Secondary | ICD-10-CM

## 2017-12-02 DIAGNOSIS — M79604 Pain in right leg: Secondary | ICD-10-CM | POA: Diagnosis not present

## 2017-12-02 DIAGNOSIS — M6249 Contracture of muscle, multiple sites: Secondary | ICD-10-CM | POA: Diagnosis not present

## 2017-12-02 NOTE — Therapy (Signed)
[facility] [address] [place], [place], [address] Phone: [phone]   Fax:  [phone]  Physical Therapy Treatment  Patient Details  Name: [name] MRN: [id number] Date of Birth: [birth year] Referring Provider: Alger Simons, MD  (dtr requested use of Dr. Naaman Plummer for referral) [name], NP & [name], MD)   Encounter Date: 12/02/2017  PT End of Session - 12/02/17 1527    Visit Number  15    Number of Visits  21    Date for PT Re-Evaluation  01/22/18    PT Start Time  1315    PT Stop Time  1400    PT Time Calculation (min)  45 min    Equipment Utilized During Treatment  Gait belt    Activity Tolerance  Patient limited by pain;Patient limited by fatigue;Patient tolerated treatment well    Behavior During Therapy  [facility] for tasks assessed/performed       Past Medical History:  Diagnosis Date  . Anemia   . Anginal pain ([place])   . Arthritis    "qwhere" (09/05/2016)  . Atrial fibrillation ([place]) 09/2016  . Chronic lower back pain   . Complication of anesthesia    "takes a long time for it to wear off; I can hallucinate if I take too much" (09/05/2016)  . DVT (deep venous thrombosis) ([place]) 10/2009  . Fall from steps 08/31/2013   Fx. pelvis, Left Hip, Left Elbow  . Fibromyalgia   . GERD (gastroesophageal reflux disease)    09/22/16- "no too much anymore"  . GI bleed 10/24/2016  . High cholesterol   . History of blood transfusion   . History of hiatal hernia   . Hypertension   . Macular degeneration, wet ([place])    "started in right eye; now legally blind in that eye; now started in left eye but pretty much in control" (09/05/2016)  . Osteoporosis   . Peripheral vascular disease ([place])    nonviable tissue Right foot  . PONV (postoperative nausea and vomiting)   . Squamous cell carcinoma of skin of right calf Aug. 2015  . Stroke ([place])    TIA's no residual  . TIA (transient ischemic attack)    "several at once; none in a long time" (09/05/2016)    Past Surgical History:  Procedure Laterality Date  . ABDOMINAL AORTAGRAM N/A 12/26/2014   Procedure: ABDOMINAL Maxcine Ham;  Surgeon: Serafina Mitchell, MD;  Location: [facility] CATH LAB;  Service: Cardiovascular;  Laterality: N/A;  . AMPUTATION Right 12/23/2016   Procedure: AMPUTATION BELOW KNEE;  Surgeon: Serafina Mitchell, MD;  Location: [facility];  Service: Vascular;  Laterality: Right;  . AORTOGRAM N/A 09/26/2016   Procedure: AORTOGRAM;  Surgeon: Waynetta Sandy, MD;  Location: [facility];  Service: Vascular;  Laterality: N/A;  . CARPAL TUNNEL RELEASE Right   . CATARACT EXTRACTION W/ INTRAOCULAR LENS  IMPLANT, BILATERAL Bilateral   . COLONOSCOPY    . DILATION AND CURETTAGE OF UTERUS    . DRESSING CHANGE UNDER ANESTHESIA Right 01/16/2017   Procedure: DRESSING CHANGE RIGHT BELOW KNEE AMPUTATION;  Surgeon: Serafina Mitchell, MD;  Location: [facility];  Service: Vascular;  Laterality: Right;  . EYE SURGERY Right    "macular OR"  . FEMORAL ARTERY STENT  12-11-10   Left SFA  . FEMORAL-POPLITEAL BYPASS GRAFT Left 09/24/2016   Procedure: REDO FEMORAL TO POPLITEAL ARTERY BYPASS GRAFT USING 6MM PROPATEN RINGED GORTEX GRAFT;  Surgeon: Serafina Mitchell, MD;  Location:  [facility] OR;  Service: Vascular;  Laterality: Left;  . FEMORAL-TIBIAL BYPASS GRAFT Left 01/16/2017   Procedure: REDO BYPASS GRAFT FEMORAL-TIBIAL ARTERY WITH GORTEX GRAFT;  Surgeon: Serafina Mitchell, MD;  Location: [facility];  Service: Vascular;  Laterality: Left;  AND LOWER LEG   . I&D EXTREMITY Right 12/17/2016   Procedure: IRRIGATION AND DEBRIDEMENT RIGHT FOOT;  Surgeon: Serafina Mitchell, MD;  Location: [facility];  Service: Vascular;  Laterality: Right;  . INCISION AND DRAINAGE OF WOUND Left 10/25/2009   leg/notes 11/13/2009  . INSERTION OF ILIAC STENT Left 12/26/2014   Procedure: INSERTION OF ILIAC STENT;  Surgeon: Serafina Mitchell, MD;  Location: [facility] CATH LAB;  Service: Cardiovascular;  Laterality: Left;  . INSERTION OF  ILIAC STENT Left 09/26/2016   Procedure: SUB INTIMAL INSERTION OF SUPERFICIAL FEMORAL ARTERY AND BELOW KNEE BYPASS GRAFT;  Surgeon: Waynetta Sandy, MD;  Location: [place] [facility];  Service: Vascular;  Laterality: Left;  . JOINT REPLACEMENT     knee  . JOINT REPLACEMENT Left Oct. 17, 2014   Elbow ( pt fell 08-31-13 )  . ORIF SHOULDER FRACTURE Right    "it was crushed"  . PERIPHERAL VASCULAR CATHETERIZATION N/A 10/30/2015   Procedure: Abdominal Aortogram w/Lower Extremity;  Surgeon: Serafina Mitchell, MD;  Location: [facility] CV LAB;  Service: Cardiovascular;  Laterality: N/A;  . PERIPHERAL VASCULAR CATHETERIZATION  10/30/2015   Procedure: Peripheral Vascular Intervention;  Surgeon: Serafina Mitchell, MD;  Location: [facility] CV LAB;  Service: Cardiovascular;;  . PERIPHERAL VASCULAR CATHETERIZATION N/A 04/01/2016   Procedure: Abdominal Aortogram w/Lower Extremity;  Surgeon: Serafina Mitchell, MD;  Location: [facility] CV LAB;  Service: Cardiovascular;  Laterality: N/A;  . PERIPHERAL VASCULAR CATHETERIZATION Left 04/01/2016   Procedure: Peripheral Vascular Atherectomy;  Surgeon: Serafina Mitchell, MD;  Location: [facility] CV LAB;  Service: Cardiovascular;  Laterality: Left;  Superficial femoral artery.  Marland Kitchen PERIPHERAL VASCULAR CATHETERIZATION Right 09/05/2016   "stent"  . PERIPHERAL VASCULAR CATHETERIZATION N/A 09/05/2016   Procedure: Abdominal Aortogram w/Lower Extremity;  Surgeon: Serafina Mitchell, MD;  Location: [facility] CV LAB;  Service: Cardiovascular;  Laterality: N/A;  . PERIPHERAL VASCULAR CATHETERIZATION Right 09/05/2016   Procedure: Peripheral Vascular Intervention;  Surgeon: Serafina Mitchell, MD;  Location: [facility] CV LAB;  Service: Cardiovascular;  Laterality: Right;  Superficial Femoral  . PERIPHERAL VASCULAR CATHETERIZATION Left 09/09/2016   Procedure: Lower Extremity Angiography;  Surgeon: Serafina Mitchell, MD;  Location: [facility] CV LAB;  Service: Cardiovascular;  Laterality:  Left;  . PR VEIN BYPASS GRAFT,AORTO-FEM-POP  09-13-09   Left Fem-pop  . THROMBECTOMY FEMORAL ARTERY Right 09/26/2016   Procedure: Thromboembolectomy Right Lower Extremity, Right Femoral Artery Endarterectomy with Patch Angioplasty; Right Lower Extremity Angiogram ;  Surgeon: Waynetta Sandy, MD;  Location: [facility];  Service: Vascular;  Laterality: Right;  . TOTAL ELBOW ARTHROPLASTY Left 09/03/2013   Procedure: LEFT TOTAL ELBOW ARTHROPLASTY;  Surgeon: Roseanne Kaufman, MD;  Location: [facility];  Service: Orthopedics;  Laterality: Left;  . TOTAL KNEE ARTHROPLASTY Left 06/2006  . TRANSMETATARSAL AMPUTATION Right 12/11/2016   Procedure: TRANSMETATARSAL AMPUTATION;  Surgeon: Serafina Mitchell, MD;  Location: [facility];  Service: Vascular;  Laterality: Right;  . TUBAL LIGATION    . VAGINAL HYSTERECTOMY      There were no vitals filed for this visit.  Subjective Assessment - 12/02/17 1318    Subjective  She has been wearing shoe without AFO up to 6hrs and liner only. She has  been adding prosthesis for transfers.     Patient is accompained by:  Family member    Pertinent History  Right TTA 12/23/16, left foot drop, Left bypass graft Oct 2010 & 2018, arthritis, A-fib, DVT, fall from stetps fx pelvis, left hip & left elbow, fx right shoulder, fibromyalgia, HTN, macular degeneration blind right eye, osteoporosis, PVD, CVA/TIA,     Limitations  Lifting;Standing;Walking;House hold activities    Patient Stated Goals  To use prosthesis to get around her house and get into car to go places like church.     Currently in Pain?  Yes    Pain Score  3     Pain Location  Leg    Pain Orientation  Right    Pain Descriptors / Indicators  Aching;Sore    Pain Type  Chronic pain    Pain Onset  More than a month ago    Pain Frequency  Constant    Aggravating Factors   arthritis, wearing prosthesis    Pain Relieving Factors  medication, rest    Pain Score  6    Pain Location  Arm    Pain Orientation  Left;Upper    Pain  Descriptors / Indicators  Aching;Grimacing;Sharp    Pain Type  Chronic pain    Pain Onset  More than a month ago    Pain Frequency  Constant    Aggravating Factors   arm hurting    Pain Relieving Factors  medications, resting arm      Prosthetic Training: Patient's daughter explained set-up of patient's bathroom which PT simulated set-up including 2 walls.  Pt performed scooting sliding board transfers with prosthesis & Left shoe (AFO off) with supervision/min guard. Going to her left, PT had to stabilize prosthesis by blocking foot as pt extended knee causing prosthesis to lift off floor. Once PT stabilized by blocking front of prosthetic foot, pt was able to engage both LEs. Going to patient's right, PT used her foot to stabilize lateral left ankle from rolling/supinating. PT demo, instructed in set-up of transfer, stabilizing feet as noted, sliding board anti-slip on 1/2 of bottom and use of baby powder on board if skin contact by raising gown prior to transfer to toilet. Pt & dtr verbalized understanding.  PT instructed pt & dtr in increasing wear of prosthetic liner and left shoe to enable use of prosthesis & shoe in transfers & not use LUE as MD advised. PT instructed in using prosthesis & shoe to get onto tub bench for showering and doffing while showering. Then donning prosthesis & left shoe for transfer off tub bench. Pt & dtr verbalized understanding.                          PT Education - 12/02/17 1430    Education provided  Yes    Education Details  Wearing prosthetic liner & left shoe most of awake hours to enable quicker ability to transfer. Ambulating 1x/day with daughter's assist.    Person(s) Educated  Patient;Child(ren)    Methods  Explanation;Verbal cues    Comprehension  Verbalized understanding;Need further instruction       PT Short Term Goals - 12/02/17 1853      PT SHORT TERM GOAL #1   Title  Patient & daughter verbalize proper ply sock  adjustment with pre-tibial pads in socket. (All STGs Target Date: 12/23/2017)    Time  4    Period  Weeks  Status  On-going    Target Date  12/23/17      PT SHORT TERM GOAL #2   Title  Patient reports wear of prosthetic liner & left shoe >50% of awake hours without skin issues.     Time  4    Period  Weeks    Status  On-going    Target Date  12/23/17      PT SHORT TERM GOAL #3   Title  Patient performs lateral scooting transfers with sliding board with prosthesis & left shoe without use of LUE with supervision.     Time  4    Period  Weeks    Status  On-going    Target Date  12/23/17      PT SHORT TERM GOAL #4   Title  Patient able to stand with RW support with prosthesis & RW for 3 minutes with supervision.     Time  4    Period  Weeks    Status  On-going    Target Date  12/23/17      PT SHORT TERM GOAL #5   Title  Patient ambulates 34' with RW, prosthesis & AFO with minA.     Time  4    Period  Weeks    Status  On-going    Target Date  12/23/17        PT Long Term Goals - 11/23/17 1943      PT LONG TERM GOAL #1   Title  Patient's daughter & patient verbalize proper prosthetic care with new shoe & prosthetic changes. (All LTGs Target Date 01/22/2018)    Time  8    Period  Weeks    Status  On-going    Target Date  01/22/18      PT LONG TERM GOAL #2   Title  Patient tolerates wear of TTA prosthetic liner & left shoe daily >75% of awake hours, prosthesis up to 3hrs at time & intermittently for transfers without skin issues. AFO only for gait with daughter.     Time  8    Period  Weeks    Status  On-going    Target Date  01/22/18      PT LONG TERM GOAL #3   Title  Patient performs sit to/from stand transfers chairs with armrests to RW and scooting sliding board with prosthesis & left shoe with supervision.     Time  8    Period  Weeks    Status  On-going    Target Date  01/22/18      PT LONG TERM GOAL #4   Title  Patient standing balance with prosthesis, AFO &  RW reaching 5" anterior to RW, scanning environment with head turns and managing pants for toileting with minA.     Baseline  11/05/17:  met today    Status  Achieved      PT LONG TERM GOAL #5   Title  Patient ambulates 80' with RW, prosthesis & AFO with minA from daughter safely.    Time  8    Period  Weeks    Status  On-going            Plan - 12/02/17 1854    Clinical Impression Statement  Patient appears to be tolerating wear of left shoe without AFO more of awake hours and liner for prosthesis making ability to doone prosthesis easier for transfers.  Patient was able to transfer w/c to simulated toilet for  her bathroom set-up using BLEs and not using LUE.     Rehab Potential  Good    Clinical Impairments Affecting Rehab Potential  Right TTA 12/23/16, left foot drop, L bypass graft Oct 2010, arthritis, A-fib, DVT, Blood clot LLE foot drop, R TTA, fall from stetps fx pelvis, left hip & left elbow, fibromyalgia, HTN, macular degeneration blind right eye, osteoporosis, PVD, CVA, TIA, right elbow replacement, chronic LBP,     PT Frequency  1x / week    PT Duration  8 weeks    PT Treatment/Interventions  ADLs/Self Care Home Management;Moist Heat;DME Instruction;Gait training;Stair training;Functional mobility training;Therapeutic activities;Therapeutic exercise;Balance training;Neuromuscular re-education;Patient/family education;Orthotic Fit/Training;Prosthetic Training;Vestibular    PT Next Visit Plan  progress transfers & gait with prosthesis & new shoe with AFO    Consulted and Agree with Plan of Care  Patient;Family member/caregiver    Family Member Consulted  daughter       Patient will benefit from skilled therapeutic intervention in order to improve the following deficits and impairments:  Abnormal gait, Cardiopulmonary status limiting activity, Decreased activity tolerance, Decreased balance, Decreased endurance, Decreased knowledge of use of DME, Decreased range of motion,  Decreased mobility, Decreased skin integrity, Decreased strength, Difficulty walking, Increased edema, Impaired flexibility, Postural dysfunction, Pain, Prosthetic Dependency, Dizziness  Visit Diagnosis: Muscle weakness (generalized)  Unsteadiness on feet  Other abnormalities of gait and mobility  Pain in left leg  Pain in right leg     Problem List Patient Active Problem List   Diagnosis Date Noted  . Plantar fasciitis of left foot 06/10/2017  . Idiopathic chronic venous hypertension of both lower extremities with inflammation 05/21/2017  . Foot drop, left 05/21/2017  . Decubitus ulcer of sacral region, unstageable ([place])   . Deep tissue injury   . Hypokalemia   . Hypoalbuminemia due to protein-calorie malnutrition ([place])   . Debilitated 01/21/2017  . Neuropathic pain   . Post-operative pain   . Slow transit constipation   . Debility   . Ischemic leg   . PAF (paroxysmal atrial fibrillation) ([place])   . Anemia of chronic disease   . Chronic pain syndrome   . Fibromyalgia   . Stage 3 chronic kidney disease ([place])   . Benign essential HTN   . Abnormal urinalysis   . PVD (peripheral vascular disease) ([place]) 01/13/2017  . Post-op pain 01/03/2017  . Phantom limb pain ([place]) 01/03/2017  . S/P unilateral BKA (below knee amputation), right ([place]) 12/30/2016  . Acute on chronic combined systolic and diastolic CHF (congestive heart failure) ([place]) 12/22/2016  . Anemia 12/22/2016  . Palliative care encounter   . Lower extremity pain, right   . Acute GI bleeding 10/23/2016  . Paroxysmal atrial fibrillation ([place]) 10/03/2016  . Atherosclerosis of autologous vein bypass graft of left lower extremity with rest pain ([place]) 09/24/2016  . PAD (peripheral artery disease) ([place]) 09/05/2016  . Groin pain 04/02/2015  . Aftercare following surgery of the circulatory system, [place] 12/13/2013  . Shingles 10/26/2013  . Anxiety 10/26/2013  . Acute blood loss anemia 09/05/2013  . Elbow fracture, left  09/05/2013  . Left acetabular fracture ([place]) 09/05/2013  . Ankle fracture, left 09/05/2013  . HTN (hypertension) 09/03/2013  . HLD (hyperlipidemia) 09/03/2013  . Peripheral vascular disease, unspecified ([place]) 05/10/2012  . Chronic total occlusion of artery of the extremities ([place]) 01/19/2012    [name] PT, DPT 12/02/2017, 6:58 PM  [facility] [address] [place], [place], [address] Phone: [phone]  Fax:  [phone]  Name: [name] MRN: [id number] Date of Birth: 06-Dec-1927

## 2017-12-07 ENCOUNTER — Encounter: Payer: Medicare Other | Admitting: Physical Therapy

## 2017-12-09 ENCOUNTER — Ambulatory Visit: Payer: Medicare Other | Admitting: Physical Therapy

## 2017-12-14 ENCOUNTER — Encounter: Payer: Medicare Other | Admitting: Physical Therapy

## 2017-12-14 DIAGNOSIS — Z96629 Presence of unspecified artificial elbow joint: Secondary | ICD-10-CM | POA: Diagnosis not present

## 2017-12-14 DIAGNOSIS — M25522 Pain in left elbow: Secondary | ICD-10-CM | POA: Diagnosis not present

## 2017-12-16 ENCOUNTER — Encounter: Payer: Medicare Other | Admitting: Physical Therapy

## 2017-12-16 ENCOUNTER — Ambulatory Visit: Payer: Medicare Other | Admitting: Physical Therapy

## 2017-12-17 ENCOUNTER — Telehealth: Payer: Self-pay | Admitting: Physical Therapy

## 2017-12-17 NOTE — Telephone Encounter (Addendum)
[name], PT returned phone call to patient's daughter. She reports Dr. Amedeo Plenty reinforced absolutely little (no more than 3# extremely light) use of LUE, but really wants no use of LUE. Daughter & PT discussed and decided to discontinue all gait at this time. Daughter reports she has been wearing the shoe & liner most of awake hours and they help with transfers. Patient's daughter agrees to discharge as patient is pleased with current level of function now that no longer appropriate for gait.

## 2017-12-21 ENCOUNTER — Encounter: Payer: Medicare Other | Admitting: Physical Therapy

## 2017-12-23 ENCOUNTER — Encounter: Payer: Medicare Other | Admitting: Physical Therapy

## 2017-12-28 ENCOUNTER — Encounter: Payer: Medicare Other | Admitting: Physical Therapy

## 2017-12-30 ENCOUNTER — Encounter: Payer: Medicare Other | Admitting: Physical Therapy

## 2018-01-04 ENCOUNTER — Encounter: Payer: Medicare Other | Admitting: Physical Therapy

## 2018-01-06 ENCOUNTER — Encounter: Payer: Medicare Other | Admitting: Physical Therapy

## 2018-01-11 ENCOUNTER — Encounter: Payer: Medicare Other | Admitting: Physical Therapy

## 2018-01-13 ENCOUNTER — Encounter: Payer: Medicare Other | Admitting: Physical Therapy

## 2018-01-20 DIAGNOSIS — M25512 Pain in left shoulder: Secondary | ICD-10-CM | POA: Diagnosis not present

## 2018-01-20 DIAGNOSIS — M25522 Pain in left elbow: Secondary | ICD-10-CM | POA: Diagnosis not present

## 2018-01-20 DIAGNOSIS — Z96629 Presence of unspecified artificial elbow joint: Secondary | ICD-10-CM | POA: Diagnosis not present

## 2018-02-02 ENCOUNTER — Encounter: Payer: Self-pay | Admitting: Physical Therapy

## 2018-02-02 NOTE — Therapy (Signed)
[facility] [address] [place], [place], [address] Phone: [phone]   Fax:  [phone]  Patient Details  Name: [name] MRN: [id number] Date of Birth: [birth year] Referring Provider:  Alger Simons, MD  Encounter Date: 02/02/2018  PHYSICAL THERAPY DISCHARGE SUMMARY  Visits from Start of Care: 15  Current functional level related to goals / functional outcomes: On 12/17/2017, patient's daughter called to report doctor no longer wanted any weight bearing even with platform for LUE. She reports that her mother is happy with current level of function & wants discharge. Patient was wearing TTA liner during awake hours so she could connect prosthesis quickly for transfers. She was wearing left stretch orthopedic shoe with custom insert during most of awake hours. We disconnected the AFO with instructions to attach for all standing or gait activities. Patient would not tolerate left shoe wear with AFO. Use of shoe with insert enabled more stable left ankle/foot during scooting transfers.  Patient was able to perform sliding board transfers without use of LUE if she was wearing prosthesis & left shoe with supervision.    Remaining deficits: See above   Education / Equipment: Prosthetic care/wear, left shoe wear (changed to stretch orthopedic with custom insert) and transfers.   Plan: Patient agrees to discharge.  Patient goals were not met. Patient is being discharged due to being pleased with the current functional level.  ?????         Billiejo Sorto PT, DPT 02/02/2018, 8:53 AM  [facility] [address] [place], [place], [address] Phone: [phone]   Fax:  [phone]

## 2018-02-20 DIAGNOSIS — M25522 Pain in left elbow: Secondary | ICD-10-CM | POA: Diagnosis not present

## 2018-02-20 DIAGNOSIS — Z96629 Presence of unspecified artificial elbow joint: Secondary | ICD-10-CM | POA: Diagnosis not present

## 2018-02-20 DIAGNOSIS — M25512 Pain in left shoulder: Secondary | ICD-10-CM | POA: Diagnosis not present

## 2018-03-01 ENCOUNTER — Ambulatory Visit (INDEPENDENT_AMBULATORY_CARE_PROVIDER_SITE_OTHER): Payer: Medicare Other | Admitting: Surgery

## 2018-03-01 ENCOUNTER — Ambulatory Visit (HOSPITAL_COMMUNITY)
Admission: RE | Admit: 2018-03-01 | Discharge: 2018-03-01 | Disposition: A | Discharge status: Home / Self Care | Payer: Medicare Other | Source: Ambulatory Visit | Attending: Surgery | Admitting: Surgery

## 2018-03-01 ENCOUNTER — Encounter: Payer: Self-pay | Admitting: Surgery

## 2018-03-01 ENCOUNTER — Other Ambulatory Visit: Payer: Self-pay

## 2018-03-01 VITALS — BP 163/79 | HR 68 | Temp 97.0°F | Resp 20 | Ht 62.0 in | Wt 132.0 lb

## 2018-03-01 DIAGNOSIS — E785 Hyperlipidemia, unspecified: Secondary | ICD-10-CM | POA: Diagnosis not present

## 2018-03-01 DIAGNOSIS — I739 Peripheral vascular disease, unspecified: Secondary | ICD-10-CM | POA: Diagnosis not present

## 2018-03-01 DIAGNOSIS — I70235 Atherosclerosis of native arteries of right leg with ulceration of other part of foot: Secondary | ICD-10-CM

## 2018-03-01 DIAGNOSIS — Z89511 Acquired absence of right leg below knee: Secondary | ICD-10-CM

## 2018-03-01 DIAGNOSIS — I10 Essential (primary) hypertension: Secondary | ICD-10-CM | POA: Insufficient documentation

## 2018-03-01 DIAGNOSIS — I70302 Unspecified atherosclerosis of unspecified type of bypass graft(s) of the extremities, left leg: Secondary | ICD-10-CM | POA: Diagnosis not present

## 2018-03-01 DIAGNOSIS — Z87891 Personal history of nicotine dependence: Secondary | ICD-10-CM | POA: Insufficient documentation

## 2018-03-01 NOTE — Progress Notes (Signed)
Established Intermittent Claudication   History of Present Illness   [name] is a 84 y.o. ([birth year]) female who who returns to clinic to go over left lower extremity arterial duplex and ABI.  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  Bypass of LLE was then found to be occluded and she most recently she underwent left profunda to anterior tibial bypass with PTFE in March 2018.  Since last office visit she has come to the agreement with her therapy teams that she will not be able to have her walk with a prosthetic without requiring assistance insist that time has been wheelchair-bound.  She denies any new wounds or sores on her left foot or right BKA stump.  She has phantom pain and pain in her left leg for which she takes Neurontin as well as occasional narcotic pain medication.  She is on her Plavix 75 mg daily.  She denies tobacco use.  The patient's PMH, PSH, SH, and FamHx were reviewed on 03/01/18 are unchanged from prior visit.  Current Outpatient Medications  Medication Sig Dispense Refill  . acetaminophen (TYLENOL) 500 MG tablet Take 1,000 mg by mouth 2 (two) times daily as needed for moderate pain or headache. Patient took this medication for her pain.    . Cholecalciferol (VITAMIN D3) 2000 UNITS TABS Take 2,000 Units by mouth at bedtime.     . clopidogrel (PLAVIX) 75 MG tablet Take 1 tablet (75 mg total) by mouth daily. 30 tablet 1  . Cyanocobalamin (VITAMIN B-12 PO) Take 1 tablet by mouth daily.    . diazepam (VALIUM) 5 MG tablet Take 0.5 tablets (2.5 mg total) by mouth at bedtime as needed for anxiety. 20 tablet 0  . loperamide (IMODIUM) 2 MG capsule Take 2 mg by mouth as needed for diarrhea or loose stools.    . metoprolol tartrate (LOPRESSOR) 25 MG  tablet Take 1 tablet (25 mg total) by mouth at bedtime. 30 tablet 1  . Multiple Vitamins-Minerals (EYE VITAMINS PO) Take 1 tablet by mouth at bedtime.     . valsartan-hydrochlorothiazide (DIOVAN-HCT) 160-12.5 MG tablet Take 0.5 tablets by mouth daily. 30 tablet 0  . gabapentin (NEURONTIN) 600 MG tablet Take 1 tablet (600 mg total) by mouth as directed. 1 tablet twice daily and 1.5 to 2 tablets at night 120 tablet 1   No current facility-administered medications for this visit.     On ROS today: 10 system ROS is negative unless otherwise noted in HPI.   Physical Examination   Vitals:   03/01/18 1357  BP: (!) 163/79  Pulse: 68  Resp: 20  Temp: (!) 97 F (36.1 C)  TempSrc: Oral  SpO2: 99%  Weight: 132 lb (59.9 kg)  Height: 5\' 2"  (1.575 m)   Body mass index is 24.14 kg/m.  General Alert, O x 3, WD, NAD  Pulmonary Sym exp, good B air movt,  Cardiac RRR, Nl S1, S2,  Vascular Vessel Right Left  Radial Palpable Palpable  Aorta Not palpable N/A  Popliteal Not palpable Not palpable  PT BKA Not palpable  DP BKA palpable pulse in graft at level of distal anterior shin    Gastro- intestinal soft, non tender  Musculo- skeletal M/S 5/5 throughout  , Extremities without ischemic changes  , No edema present, No visible varicosities ,  No Lipodermatosclerosis present    Non-Invasive Vascular imaging   ABI (03/01/18)  L:   ABI: 0.90 (0.92),   PT: mono  DP: mono  TBI: 0.54  Bypass Duplex   Proximal anastomosis 330cm/s   Medical Decision Making   [name] is a 84 y.o. female who presents to go over bypass duplex of left lower extremity and ABI   Bypass remains patent however proximal anastomosis stenosis is noted on duplex  ABI remains unchanged 0.90  Encouraged continued activity with home exercise  Continue 75 mg Plavix daily  Recheck graft surveillance as well as ABI in 6 months; consider revascularization of proximal anastomosis if velocity continues  to increase  Return sooner if rest pain or tissue changes develop   Dagoberto Ligas, PA-C [facility] Office: [phone]

## 2018-03-02 DIAGNOSIS — I739 Peripheral vascular disease, unspecified: Secondary | ICD-10-CM | POA: Diagnosis not present

## 2018-03-02 DIAGNOSIS — G629 Polyneuropathy, unspecified: Secondary | ICD-10-CM | POA: Diagnosis not present

## 2018-03-02 DIAGNOSIS — I10 Essential (primary) hypertension: Secondary | ICD-10-CM | POA: Diagnosis not present

## 2018-03-02 DIAGNOSIS — Z89511 Acquired absence of right leg below knee: Secondary | ICD-10-CM | POA: Diagnosis not present

## 2018-03-02 DIAGNOSIS — E782 Mixed hyperlipidemia: Secondary | ICD-10-CM | POA: Diagnosis not present

## 2018-03-02 DIAGNOSIS — F064 Anxiety disorder due to known physiological condition: Secondary | ICD-10-CM | POA: Diagnosis not present

## 2018-03-02 DIAGNOSIS — G894 Chronic pain syndrome: Secondary | ICD-10-CM | POA: Diagnosis not present

## 2018-03-29 ENCOUNTER — Other Ambulatory Visit: Payer: Self-pay

## 2018-03-29 DIAGNOSIS — Z89511 Acquired absence of right leg below knee: Secondary | ICD-10-CM

## 2018-03-29 DIAGNOSIS — I739 Peripheral vascular disease, unspecified: Secondary | ICD-10-CM

## 2018-04-07 DIAGNOSIS — M79642 Pain in left hand: Secondary | ICD-10-CM | POA: Diagnosis not present

## 2018-04-07 DIAGNOSIS — M25512 Pain in left shoulder: Secondary | ICD-10-CM | POA: Diagnosis not present

## 2018-04-07 DIAGNOSIS — Z96629 Presence of unspecified artificial elbow joint: Secondary | ICD-10-CM | POA: Diagnosis not present

## 2018-04-07 DIAGNOSIS — M25522 Pain in left elbow: Secondary | ICD-10-CM | POA: Diagnosis not present

## 2018-06-08 ENCOUNTER — Ambulatory Visit
Admission: RE | Admit: 2018-06-08 | Discharge: 2018-06-08 | Disposition: A | Discharge status: Home / Self Care | Payer: Medicare Other | Source: Ambulatory Visit | Attending: Internal Medicine | Admitting: Internal Medicine

## 2018-06-08 ENCOUNTER — Other Ambulatory Visit: Payer: Self-pay | Admitting: Internal Medicine

## 2018-06-08 DIAGNOSIS — R609 Edema, unspecified: Secondary | ICD-10-CM | POA: Diagnosis not present

## 2018-06-08 DIAGNOSIS — R0602 Shortness of breath: Secondary | ICD-10-CM

## 2018-06-08 DIAGNOSIS — R791 Abnormal coagulation profile: Secondary | ICD-10-CM | POA: Diagnosis not present

## 2018-06-08 DIAGNOSIS — R7989 Other specified abnormal findings of blood chemistry: Secondary | ICD-10-CM

## 2018-06-08 MED ORDER — IOPAMIDOL (ISOVUE-370) INJECTION 76%
75.0000 mL | Freq: Once | INTRAVENOUS | Status: AC | PRN
  Administered 2018-06-08: 75 mL via INTRAVENOUS

## 2018-06-17 ENCOUNTER — Other Ambulatory Visit (HOSPITAL_COMMUNITY): Payer: Self-pay | Admitting: Internal Medicine

## 2018-06-17 DIAGNOSIS — R0602 Shortness of breath: Secondary | ICD-10-CM | POA: Diagnosis not present

## 2018-06-17 DIAGNOSIS — R609 Edema, unspecified: Secondary | ICD-10-CM | POA: Diagnosis not present

## 2018-06-17 DIAGNOSIS — R791 Abnormal coagulation profile: Secondary | ICD-10-CM | POA: Diagnosis not present

## 2018-06-24 ENCOUNTER — Other Ambulatory Visit: Payer: Self-pay

## 2018-06-24 ENCOUNTER — Ambulatory Visit (HOSPITAL_COMMUNITY): Payer: Medicare Other | Attending: Cardiovascular Disease

## 2018-06-24 DIAGNOSIS — R791 Abnormal coagulation profile: Secondary | ICD-10-CM | POA: Diagnosis not present

## 2018-06-24 DIAGNOSIS — I10 Essential (primary) hypertension: Secondary | ICD-10-CM | POA: Diagnosis not present

## 2018-06-24 DIAGNOSIS — Z8673 Personal history of transient ischemic attack (TIA), and cerebral infarction without residual deficits: Secondary | ICD-10-CM | POA: Insufficient documentation

## 2018-06-24 DIAGNOSIS — R0602 Shortness of breath: Secondary | ICD-10-CM | POA: Diagnosis not present

## 2018-06-24 DIAGNOSIS — D649 Anemia, unspecified: Secondary | ICD-10-CM | POA: Diagnosis not present

## 2018-06-24 DIAGNOSIS — I351 Nonrheumatic aortic (valve) insufficiency: Secondary | ICD-10-CM | POA: Insufficient documentation

## 2018-06-24 DIAGNOSIS — I82409 Acute embolism and thrombosis of unspecified deep veins of unspecified lower extremity: Secondary | ICD-10-CM | POA: Insufficient documentation

## 2018-06-24 DIAGNOSIS — I739 Peripheral vascular disease, unspecified: Secondary | ICD-10-CM | POA: Diagnosis not present

## 2018-07-08 ENCOUNTER — Emergency Department (HOSPITAL_COMMUNITY): Payer: Medicare Other

## 2018-07-08 ENCOUNTER — Encounter (HOSPITAL_COMMUNITY): Payer: Self-pay | Admitting: Emergency Medicine

## 2018-07-08 ENCOUNTER — Emergency Department (HOSPITAL_COMMUNITY)
Admission: EM | Admit: 2018-07-08 | Discharge: 2018-07-08 | Disposition: A | Discharge status: Home / Self Care | Payer: Medicare Other | Attending: Emergency Medicine | Admitting: Emergency Medicine

## 2018-07-08 DIAGNOSIS — S72435A Nondisplaced fracture of medial condyle of left femur, initial encounter for closed fracture: Secondary | ICD-10-CM | POA: Diagnosis not present

## 2018-07-08 DIAGNOSIS — W19XXXA Unspecified fall, initial encounter: Secondary | ICD-10-CM

## 2018-07-08 DIAGNOSIS — S8992XA Unspecified injury of left lower leg, initial encounter: Secondary | ICD-10-CM | POA: Diagnosis not present

## 2018-07-08 DIAGNOSIS — I5043 Acute on chronic combined systolic (congestive) and diastolic (congestive) heart failure: Secondary | ICD-10-CM | POA: Insufficient documentation

## 2018-07-08 DIAGNOSIS — I130 Hypertensive heart and chronic kidney disease with heart failure and stage 1 through stage 4 chronic kidney disease, or unspecified chronic kidney disease: Secondary | ICD-10-CM | POA: Insufficient documentation

## 2018-07-08 DIAGNOSIS — S79911A Unspecified injury of right hip, initial encounter: Secondary | ICD-10-CM | POA: Diagnosis not present

## 2018-07-08 DIAGNOSIS — Y999 Unspecified external cause status: Secondary | ICD-10-CM | POA: Insufficient documentation

## 2018-07-08 DIAGNOSIS — S99922A Unspecified injury of left foot, initial encounter: Secondary | ICD-10-CM | POA: Diagnosis not present

## 2018-07-08 DIAGNOSIS — M79662 Pain in left lower leg: Secondary | ICD-10-CM | POA: Diagnosis not present

## 2018-07-08 DIAGNOSIS — S79912A Unspecified injury of left hip, initial encounter: Secondary | ICD-10-CM | POA: Diagnosis not present

## 2018-07-08 DIAGNOSIS — R2689 Other abnormalities of gait and mobility: Secondary | ICD-10-CM | POA: Insufficient documentation

## 2018-07-08 DIAGNOSIS — Z23 Encounter for immunization: Secondary | ICD-10-CM | POA: Insufficient documentation

## 2018-07-08 DIAGNOSIS — N183 Chronic kidney disease, stage 3 (moderate): Secondary | ICD-10-CM | POA: Insufficient documentation

## 2018-07-08 DIAGNOSIS — Z79899 Other long term (current) drug therapy: Secondary | ICD-10-CM | POA: Insufficient documentation

## 2018-07-08 DIAGNOSIS — Y939 Activity, unspecified: Secondary | ICD-10-CM | POA: Insufficient documentation

## 2018-07-08 DIAGNOSIS — S72432A Displaced fracture of medial condyle of left femur, initial encounter for closed fracture: Secondary | ICD-10-CM

## 2018-07-08 DIAGNOSIS — Z87891 Personal history of nicotine dependence: Secondary | ICD-10-CM | POA: Insufficient documentation

## 2018-07-08 DIAGNOSIS — S80919A Unspecified superficial injury of unspecified knee, initial encounter: Secondary | ICD-10-CM | POA: Diagnosis not present

## 2018-07-08 DIAGNOSIS — Y929 Unspecified place or not applicable: Secondary | ICD-10-CM | POA: Insufficient documentation

## 2018-07-08 DIAGNOSIS — M25552 Pain in left hip: Secondary | ICD-10-CM | POA: Diagnosis not present

## 2018-07-08 DIAGNOSIS — M25562 Pain in left knee: Secondary | ICD-10-CM | POA: Diagnosis not present

## 2018-07-08 DIAGNOSIS — S81811A Laceration without foreign body, right lower leg, initial encounter: Secondary | ICD-10-CM | POA: Diagnosis not present

## 2018-07-08 LAB — BASIC METABOLIC PANEL
ANION GAP: 8 (ref 5–15)
BUN: 21 mg/dL (ref 8–23)
CALCIUM: 9.6 mg/dL (ref 8.9–10.3)
CO2: 28 mmol/L (ref 22–32)
Chloride: 101 mmol/L (ref 98–111)
Creatinine, Ser: 1.29 mg/dL — ABNORMAL HIGH (ref 0.44–1.00)
GFR, EST AFRICAN AMERICAN: 42 mL/min — AB (ref >60)
GFR, EST NON AFRICAN AMERICAN: 36 mL/min — AB (ref >60)
Glucose, Bld: 118 mg/dL — ABNORMAL HIGH (ref 70–99)
Potassium: 4 mmol/L (ref 3.5–5.1)
Sodium: 137 mmol/L (ref 135–145)

## 2018-07-08 LAB — CBC WITH DIFFERENTIAL/PLATELET
BAND NEUTROPHILS: 0 %
BASOS ABS: 0 10*3/uL (ref 0.0–0.1)
BLASTS: 0 %
Basophils Relative: 0 %
EOS ABS: 0.2 10*3/uL (ref 0.0–0.7)
Eosinophils Relative: 3 %
HCT: 38.7 % (ref 36.0–46.0)
Hemoglobin: 12.7 g/dL (ref 12.0–15.0)
LYMPHS ABS: 1.9 10*3/uL (ref 0.7–4.0)
Lymphocytes Relative: 24 %
MCH: 31.6 pg (ref 26.0–34.0)
MCHC: 32.8 g/dL (ref 30.0–36.0)
MCV: 96.3 fL (ref 78.0–100.0)
METAMYELOCYTES PCT: 0 %
MONOS PCT: 13 %
Monocytes Absolute: 1 10*3/uL (ref 0.1–1.0)
Myelocytes: 0 %
NEUTROS ABS: 4.9 10*3/uL (ref 1.7–7.7)
Neutrophils Relative %: 60 %
Other: 0 %
PLATELETS: 273 10*3/uL (ref 150–400)
Promyelocytes Relative: 0 %
RBC: 4.02 MIL/uL (ref 3.87–5.11)
RDW: 13.3 % (ref 11.5–15.5)
Smear Review: ADEQUATE
WBC: 8 10*3/uL (ref 4.0–10.5)
nRBC: 0 /100 WBC

## 2018-07-08 LAB — PROTIME-INR
INR: 0.98
PROTHROMBIN TIME: 12.8 s (ref 11.4–15.2)

## 2018-07-08 MED ORDER — TETANUS-DIPHTH-ACELL PERTUSSIS 5-2.5-18.5 LF-MCG/0.5 IM SUSP
0.5000 mL | Freq: Once | INTRAMUSCULAR | Status: AC
Start: 2018-07-08 — End: 2018-07-08
  Administered 2018-07-08: 0.5 mL via INTRAMUSCULAR
  Filled 2018-07-08: qty 0.5

## 2018-07-08 MED ORDER — FENTANYL CITRATE (PF) 100 MCG/2ML IJ SOLN
50.0000 ug | Freq: Once | INTRAMUSCULAR | Status: AC
  Administered 2018-07-08: 50 ug via INTRAVENOUS
  Filled 2018-07-08: qty 2

## 2018-07-08 NOTE — ED Triage Notes (Signed)
Patient arrived from home via EMS related to fall from yesterday. She fell trying to get up from the toilet. Patient has a history of right below knee amputation-she states that she "scraped" her leg from her fall yesterday.  She also reports that her left knee "aches"

## 2018-07-08 NOTE — Progress Notes (Signed)
Orthopedic Tech Progress Note Patient Details:  [name] 1928/10/19 [id number]  Ortho Devices Type of Ortho Device: Knee Immobilizer Ortho Device/Splint Location: lle Ortho Device/Splint Interventions: Application   Post Interventions Patient Tolerated: Well Instructions Provided: Care of device   [name] 07/08/2018, 4:14 PM

## 2018-07-08 NOTE — Discharge Instructions (Signed)
Please use your knee immobilizer as they directed you.  Please follow-up with the orthopedic team either Dr. Lorre Nick or Dr. Lyla Glassing in the next 2 weeks.  Please continue your home pain management regimen.  If any symptoms change or worsen, please return to the nearest emergency department.

## 2018-07-08 NOTE — ED Notes (Signed)
Patient transported to X-ray 

## 2018-07-08 NOTE — Evaluation (Signed)
Physical Therapy Evaluation Patient Details Name: [name] MRN: [id number] DOB: [birth year] Today's Date: 07/08/2018   History of Present Illness  Pt is an 84 y/o female presenting to the ED after fall at home. Found to have L femoralmedial condyle fx. PMH includes DVT, fibromyalgia, a fib, HTN, PVD, osteoporosis, L TKA, and R BKA.   Clinical Impression  Pt presenting with problem above with deficits below. Mobility deferred this session, as pt did not have KI. Discussed and educated family and pt in depth about how to perform slide board transfers while using L KI. Also discussed how to perform slide board transfers into car and suggested pt be in the back seat. Offered to allow family to practice on PT, however, family refusing. Family also refusing any follow up PT at this time stating that they felt like they could perform necessary transfers at home as they were before. Will continue to follow acutely to maximize functional mobility independence and safety if pt is admitted.     Follow Up Recommendations Supervision/Assistance - 24 hour;Other (comment)(Pt's family refusing any follow up PT )    Equipment Recommendations  None recommended by PT(daughter to follow up about hospital bed with primary care )    Recommendations for Other Services       Precautions / Restrictions Precautions Precautions: Fall;Other (comment) Precaution Comments: R BKA  Required Braces or Orthoses: Knee Immobilizer - Right Knee Immobilizer - Right: On when out of bed or walking Restrictions Other Position/Activity Restrictions: Can have LLE down for transfers per MD in L KI      Mobility  Bed Mobility               General bed mobility comments: Pt did not have KI so did not attempt.   Transfers                 General transfer comment: Pt and family with questions about how to use slideboard with KI and how to get into and out of car. Educated about anterior weightshift during  slideboard transfer to prevent sliding off board. Educated about going into back seat in car so that LLE could be supported. Educated about using sheet under pt to assist with sliding pt into car and adjusting once in the car. Educated about how to assist pt in scooting back into seat when in the car using sheet and with 2 people. Offered to practice with family, however, they were refusing at this time.   Ambulation/Gait             General Gait Details: non ambulatory at baseline.   Stairs            Wheelchair Mobility    Modified Rankin (Stroke Patients Only)       Balance                                             Pertinent Vitals/Pain Pain Assessment: Faces Faces Pain Scale: Hurts little more Pain Location: L knee  Pain Descriptors / Indicators: Grimacing Pain Intervention(s): Limited activity within patient's tolerance;Monitored during session;Repositioned    Home Living Family/patient expects to be discharged to:: Private residence Living Arrangements: Children Available Help at Discharge: Family;Available 24 hours/day Type of Home: House Home Access: Ramped entrance     Home Layout: One level Home Equipment: Bedside commode;Shower seat;Wheelchair - Education administrator (  comment)(slide board )      Prior Function Level of Independence: Needs assistance   Gait / Transfers Assistance Needed: Nonambulatory at baseline. Required assist from daughter for slide board transfers from surface to surface.   ADL's / Homemaking Assistance Needed: Required assist for ADLs.         Hand Dominance        Extremity/Trunk Assessment   Upper Extremity Assessment Upper Extremity Assessment: Generalized weakness    Lower Extremity Assessment Lower Extremity Assessment: RLE deficits/detail;LLE deficits/detail RLE Deficits / Details: R BKA at baseline  LLE Deficits / Details: Did not attempt ROM secondary to fracture.        Communication    Communication: No difficulties  Cognition Arousal/Alertness: Awake/alert Behavior During Therapy: WFL for tasks assessed/performed Overall Cognitive Status: Within Functional Limits for tasks assessed                                        General Comments General comments (skin integrity, edema, etc.): Pt family asking about hospital bed for home as pt with increased back pain. Called case management to see if it was covered under insurace and gave info to family. [name] reports she would follow up with primary care physician. Also asked about getting new WC, however, pt has only had hers for 1 year. Educated about insurance reimbursment for equipment every 5 years. Did explain to pt and family to explore lightweight WC options that are custom to patient if they were interested.     Exercises     Assessment/Plan    PT Assessment Patient needs continued PT services  PT Problem List Decreased strength;Decreased range of motion;Decreased balance;Decreased activity tolerance;Decreased mobility;Decreased knowledge of use of DME;Decreased knowledge of precautions;Pain       PT Treatment Interventions DME instruction;Functional mobility training;Therapeutic activities;Therapeutic exercise;Balance training;Patient/family education    PT Goals (Current goals can be found in the Care Plan section)  Acute Rehab PT Goals Patient Stated Goal: to go home tonight  PT Goal Formulation: With patient Time For Goal Achievement: 07/22/18 Potential to Achieve Goals: Fair    Frequency Min 3X/week   Barriers to discharge        Co-evaluation               AM-PAC PT "6 Clicks" Daily Activity  Outcome Measure Difficulty turning over in bed (including adjusting bedclothes, sheets and blankets)?: Unable Difficulty moving from lying on back to sitting on the side of the bed? : Unable Difficulty sitting down on and standing up from a chair with arms (e.g., wheelchair,  bedside commode, etc,.)?: Unable Help needed moving to and from a bed to chair (including a wheelchair)?: Total Help needed walking in hospital room?: Total Help needed climbing 3-5 steps with a railing? : Total 6 Click Score: 6    End of Session   Activity Tolerance: Patient tolerated treatment well Patient left: in bed;with call bell/phone within reach Nurse Communication: Mobility status PT Visit Diagnosis: Other abnormalities of gait and mobility (R26.89);Difficulty in walking, not elsewhere classified (R26.2);Pain Pain - Right/Left: Left Pain - part of body: Knee    Time: 3893-7342 PT Time Calculation (min) (ACUTE ONLY): 32 min   Charges:   PT Evaluation $PT Eval Low Complexity: 1 Low PT Treatments $Self Care/Home Management: 8-22        Leighton Ruff, PT, DPT  Acute Rehabilitation Services  Pager: [phone]   Rudean Hitt 07/08/2018, 6:06 PM

## 2018-07-08 NOTE — Consult Note (Signed)
Reason for Consult:Left medial femoral condyle fx Referring Physician: C Tegeler  [name] is an 84 y.o. female.  HPI: [name] fell at home yesterday. She had significant LE pain, esp left knee, and could not transfer 2/2 pain. She was brought to the ED for evaluation and x-rays showed a periprosthetic left medial femoral condyle fx and orthopedic surgery was consulted. She is non-ambulatory.  Past Medical History:  Diagnosis Date  . Anemia   . Anginal pain ([place])   . Arthritis    "qwhere" (09/05/2016)  . Atrial fibrillation ([place]) 09/2016  . Chronic lower back pain   . Complication of anesthesia    "takes a long time for it to wear off; I can hallucinate if I take too much" (09/05/2016)  . DVT (deep venous thrombosis) ([place]) 10/2009  . Fall from steps 08/31/2013   Fx. pelvis, Left Hip, Left Elbow  . Fibromyalgia   . GERD (gastroesophageal reflux disease)    09/22/16- "no too much anymore"  . GI bleed 10/24/2016  . High cholesterol   . History of blood transfusion   . History of hiatal hernia   . Hypertension   . Macular degeneration, wet ([place])    "started in right eye; now legally blind in that eye; now started in left eye but pretty much in control" (09/05/2016)  . Osteoporosis   . Peripheral vascular disease ([place])    nonviable tissue Right foot  . PONV (postoperative nausea and vomiting)   . Squamous cell carcinoma of skin of right calf Aug. 2015  . Stroke ([place])    TIA's no residual  . TIA (transient ischemic attack)    "several at once; none in a long time" (09/05/2016)    Past Surgical History:  Procedure Laterality Date  . ABDOMINAL AORTAGRAM N/A 12/26/2014   Procedure: ABDOMINAL Maxcine Ham;  Surgeon: Serafina Mitchell, MD;  Location[facility] CATH LAB;  Service: Cardiovascular;  Laterality: N/A;  . AMPUTATION Right 12/23/2016   Procedure: AMPUTATION BELOW KNEE;  Surgeon: Serafina Mitchell, MD;  Location: [place];  Service: Vascular;  Laterality: Right;  . AORTOGRAM N/A  09/26/2016   Procedure: AORTOGRAM;  Surgeon: Waynetta Sandy, MD;  Location: [place];  Service: Vascular;  Laterality: N/A;  . CARPAL TUNNEL RELEASE Right   . CATARACT EXTRACTION W/ INTRAOCULAR LENS  IMPLANT, BILATERAL Bilateral   . COLONOSCOPY    . DILATION AND CURETTAGE OF UTERUS    . DRESSING CHANGE UNDER ANESTHESIA Right 01/16/2017   Procedure: DRESSING CHANGE RIGHT BELOW KNEE AMPUTATION;  Surgeon: Serafina Mitchell, MD;  Location: [place];  Service: Vascular;  Laterality: Right;  . EYE SURGERY Right    "macular OR"  . FEMORAL ARTERY STENT  12-11-10   Left SFA  . FEMORAL-POPLITEAL BYPASS GRAFT Left 09/24/2016   Procedure: REDO FEMORAL TO POPLITEAL ARTERY BYPASS GRAFT USING 6MM PROPATEN RINGED GORTEX GRAFT;  Surgeon: Serafina Mitchell, MD;  Location: [facility];  Service: Vascular;  Laterality: Left;  . FEMORAL-TIBIAL BYPASS GRAFT Left 01/16/2017   Procedure: REDO BYPASS GRAFT FEMORAL-TIBIAL ARTERY WITH GORTEX GRAFT;  Surgeon: Serafina Mitchell, MD;  Location: [place];  Service: Vascular;  Laterality: Left;  AND LOWER LEG   . I&D EXTREMITY Right 12/17/2016   Procedure: IRRIGATION AND DEBRIDEMENT RIGHT FOOT;  Surgeon: Serafina Mitchell, MD;  Location: [place];  Service: Vascular;  Laterality: Right;  . INCISION AND DRAINAGE OF WOUND Left 10/25/2009   leg/notes 11/13/2009  . INSERTION OF ILIAC STENT Left  12/26/2014   Procedure: INSERTION OF ILIAC STENT;  Surgeon: Serafina Mitchell, MD;  Location[facility] CATH LAB;  Service: Cardiovascular;  Laterality: Left;  . INSERTION OF ILIAC STENT Left 09/26/2016   Procedure: SUB INTIMAL INSERTION OF SUPERFICIAL FEMORAL ARTERY AND BELOW KNEE BYPASS GRAFT;  Surgeon: Waynetta Sandy, MD;  Location: [place];  Service: Vascular;  Laterality: Left;  . JOINT REPLACEMENT     knee  . JOINT REPLACEMENT Left Oct. 17, 2014   Elbow ( pt fell 08-31-13 )  . ORIF SHOULDER FRACTURE Right    "it was crushed"  . PERIPHERAL VASCULAR CATHETERIZATION N/A 10/30/2015   Procedure:  Abdominal Aortogram w/Lower Extremity;  Surgeon: Serafina Mitchell, MD;  Location: [place] CV LAB;  Service: Cardiovascular;  Laterality: N/A;  . PERIPHERAL VASCULAR CATHETERIZATION  10/30/2015   Procedure: Peripheral Vascular Intervention;  Surgeon: Serafina Mitchell, MD;  Location: [place] CV LAB;  Service: Cardiovascular;;  . PERIPHERAL VASCULAR CATHETERIZATION N/A 04/01/2016   Procedure: Abdominal Aortogram w/Lower Extremity;  Surgeon: Serafina Mitchell, MD;  Location: [place] CV LAB;  Service: Cardiovascular;  Laterality: N/A;  . PERIPHERAL VASCULAR CATHETERIZATION Left 04/01/2016   Procedure: Peripheral Vascular Atherectomy;  Surgeon: Serafina Mitchell, MD;  Location: [place] CV LAB;  Service: Cardiovascular;  Laterality: Left;  Superficial femoral artery.  Marland Kitchen PERIPHERAL VASCULAR CATHETERIZATION Right 09/05/2016   "stent"  . PERIPHERAL VASCULAR CATHETERIZATION N/A 09/05/2016   Procedure: Abdominal Aortogram w/Lower Extremity;  Surgeon: Serafina Mitchell, MD;  Location: [place] CV LAB;  Service: Cardiovascular;  Laterality: N/A;  . PERIPHERAL VASCULAR CATHETERIZATION Right 09/05/2016   Procedure: Peripheral Vascular Intervention;  Surgeon: Serafina Mitchell, MD;  Location: [place] CV LAB;  Service: Cardiovascular;  Laterality: Right;  Superficial Femoral  . PERIPHERAL VASCULAR CATHETERIZATION Left 09/09/2016   Procedure: Lower Extremity Angiography;  Surgeon: Serafina Mitchell, MD;  Location: [place] CV LAB;  Service: Cardiovascular;  Laterality: Left;  . PR VEIN BYPASS GRAFT,AORTO-FEM-POP  09-13-09   Left Fem-pop  . THROMBECTOMY FEMORAL ARTERY Right 09/26/2016   Procedure: Thromboembolectomy Right Lower Extremity, Right Femoral Artery Endarterectomy with Patch Angioplasty; Right Lower Extremity Angiogram ;  Surgeon: Waynetta Sandy, MD;  Location: [place];  Service: Vascular;  Laterality: Right;  . TOTAL ELBOW ARTHROPLASTY Left 09/03/2013   Procedure: LEFT TOTAL ELBOW  ARTHROPLASTY;  Surgeon: Roseanne Kaufman, MD;  Location: [place] [facility];  Service: Orthopedics;  Laterality: Left;  . TOTAL KNEE ARTHROPLASTY Left 06/2006  . TRANSMETATARSAL AMPUTATION Right 12/11/2016   Procedure: TRANSMETATARSAL AMPUTATION;  Surgeon: Serafina Mitchell, MD;  Location: [place];  Service: Vascular;  Laterality: Right;  . TUBAL LIGATION    . VAGINAL HYSTERECTOMY      Family History  Problem Relation Age of Onset  . Heart disease Father        Heart Disease before age 24  . Hyperlipidemia Father   . Hypertension Father   . Alcohol abuse Father   . Heart disease Brother   . Hyperlipidemia Brother   . Hypertension Brother   . Deep vein thrombosis Brother   . Heart disease Son        Heart Disease before age 45  . Hyperlipidemia Son   . Hypertension Son   . Heart attack Son   . Diabetes Son   . Hypertension Son   . Hyperlipidemia Sister   . Hypertension Sister     Social History:  reports that she quit smoking about 71 years  ago. Her smoking use included cigarettes. She has never used smokeless tobacco. She reports that she does not drink alcohol or use drugs.  Allergies:  Allergies  Allergen Reactions  . Motrin [Ibuprofen] Other (See Comments)    ADVERSE REACTION - GI BLEED  . Statins Other (See Comments)    ADVERSE REACTION MUSCLE PAIN & WEAKNESS  . Eliquis [Apixaban] Other (See Comments)    Lower GI bleeding  . Morphine And Related Other (See Comments)    HALLUCINATIONS REACTION IS SIDE EFFECT  . Oxycontin [Oxycodone Hcl] Other (See Comments)    [REACTION IS SIDE EFFECT]  > HALLUCINATIONS  . Promethazine Hcl Other (See Comments)    IV  Drug only, makes her act crazy  . Sulfa Antibiotics Nausea And Vomiting    Medications: I have reviewed the patient's current medications.  Results for orders placed or performed during the hospital encounter of 07/08/18 (from the past 48 hour(s))  CBC with Differential     Status: None   Collection Time: 07/08/18 11:14 AM  Result  Value Ref Range   WBC 8.0 4.0 - 10.5 K/uL    Comment: REPEATED TO VERIFY WHITE COUNT CONFIRMED ON SMEAR    RBC 4.02 3.87 - 5.11 MIL/uL   Hemoglobin 12.7 12.0 - 15.0 g/dL   HCT 38.7 36.0 - 46.0 %   MCV 96.3 78.0 - 100.0 fL   MCH 31.6 26.0 - 34.0 pg   MCHC 32.8 30.0 - 36.0 g/dL   RDW 13.3 11.5 - 15.5 %   Platelets 273 150 - 400 K/uL    Comment: REPEATED TO VERIFY SPECIMEN CHECKED FOR CLOTS PLATELET COUNT CONFIRMED BY SMEAR    Neutrophils Relative % 60 %   Lymphocytes Relative 24 %   Monocytes Relative 13 %   Eosinophils Relative 3 %   Basophils Relative 0 %   Band Neutrophils 0 %   Metamyelocytes Relative 0 %   Myelocytes 0 %   Promyelocytes Relative 0 %   Blasts 0 %   nRBC 0 0 /100 WBC   Other 0 %   Neutro Abs 4.9 1.7 - 7.7 K/uL   Lymphs Abs 1.9 0.7 - 4.0 K/uL   Monocytes Absolute 1.0 0.1 - 1.0 K/uL   Eosinophils Absolute 0.2 0.0 - 0.7 K/uL   Basophils Absolute 0.0 0.0 - 0.1 K/uL   Smear Review PLATELETS APPEAR ADEQUATE     Comment: MORPHOLOGY UNREMARKABLE Performed at [facility], [address] [place], [place] [address]   Basic metabolic panel     Status: Abnormal   Collection Time: 07/08/18 11:14 AM  Result Value Ref Range   Sodium 137 135 - 145 mmol/L   Potassium 4.0 3.5 - 5.1 mmol/L   Chloride 101 98 - 111 mmol/L   CO2 28 22 - 32 mmol/L   Glucose, Bld 118 (H) 70 - 99 mg/dL   BUN 21 8 - 23 mg/dL   Creatinine, Ser 1.29 (H) 0.44 - 1.00 mg/dL   Calcium 9.6 8.9 - 10.3 mg/dL   GFR calc non Af Amer 36 (L) >60 mL/min   GFR calc Af Amer 42 (L) >60 mL/min    Comment: (NOTE) The eGFR has been calculated using the CKD EPI equation. This calculation has not been validated in all clinical situations. eGFR's persistently <60 mL/min signify possible Chronic Kidney Disease.    Anion gap 8 5 - 15    Comment: Performed at [facility] [address], [place], [place]  [place]     Status: None   Collection Time: 07/08/18 11:14 AM  Result  Value Ref Range   Prothrombin Time 12.8 11.4 - 15.2 seconds   INR 0.98     Comment: Performed at [facility] [address], [place], [place] [address]    Dg Tibia/fibula Left  Result Date: 07/08/2018 CLINICAL DATA:  LEFT hip, knee and lower leg pain post fall EXAM: LEFT TIBIA AND FIBULA - 2 VIEW COMPARISON:  LEFT ankle radiographs 02/17/2017 FINDINGS: Osseous demineralization. LEFT knee prosthesis noted. No periprosthetic lucency. Ankle joint alignment normal. No acute fracture, dislocation, or bone destruction. IMPRESSION: Osseous demineralization and LEFT knee prosthesis without acute bony abnormalities. Electronically Signed   By: Lavonia Dana M.D.   On: 07/08/2018 13:33   Dg Tibia/fibula Right  Result Date: 07/08/2018 CLINICAL DATA:  Fall, laceration to RIGHT. EXAM: RIGHT TIBIA AND FIBULA - 2 VIEW COMPARISON:  None FINDINGS: Prior RIGHT BKA. Osseous demineralization. Medial compartment joint space narrowing. No fracture, dislocation, or bone destruction. No knee joint effusion. Scattered atherosclerotic calcifications. IMPRESSION: Osseous demineralization and degenerative changes of RIGHT knee with postsurgical changes of BKA. No acute abnormalities. Electronically Signed   By: Lavonia Dana M.D.   On: 07/08/2018 13:26   Dg Knee Complete 4 Views Left  Result Date: 07/08/2018 CLINICAL DATA:  LEFT hip, knee, and lower leg pain post fall EXAM: LEFT KNEE - COMPLETE 4+ VIEW COMPARISON:  None FINDINGS: Osseous demineralization. Components of the LEFT knee prosthesis noted. Subtle fracture identified at the medial margin of the medial femoral condyle above the prosthesis. No additional fracture or dislocation. Soft tissue swelling at medial aspect of LEFT knee. No periprosthetic lucency or knee joint effusion. Extensive vascular stents with a vascular graft at the lateral aspect of the distal LEFT thigh and LEFT knee. IMPRESSION: Osseous demineralization with postsurgical changes of LEFT total  knee arthroplasty. Nondisplaced fracture at the medial LEFT femoral condyle with overlying soft tissue swelling. Electronically Signed   By: Lavonia Dana M.D.   On: 07/08/2018 13:31   Dg Foot Complete Left  Result Date: 07/08/2018 CLINICAL DATA:  LEFT hip, knee, and lower leg pain post fall EXAM: LEFT FOOT - COMPLETE 3+ VIEW COMPARISON:  02/17/2017 FINDINGS: Osseous demineralization. Joint spaces preserved. No acute fracture, dislocation, or bone destruction. IMPRESSION: No acute osseous abnormalities. Electronically Signed   By: Lavonia Dana M.D.   On: 07/08/2018 13:32   Dg Hips Bilat W Or Wo Pelvis 5 Views  Result Date: 07/08/2018 CLINICAL DATA:  LEFT hip, knee and lower leg pain post fall EXAM: DG HIP (WITH OR WITHOUT PELVIS) 5+V BILAT COMPARISON:  None FINDINGS: Diffuse osseous demineralization. LEFT SI joint poorly visualized. RIGHT SI joint unremarkable. Significant joint space narrowing LEFT hip joint. RIGHT hip joint space demonstrates minimal narrowing. No acute fracture, dislocation, or bone destruction. Question old fracture of the LEFT inferior pubic ramus. Extensive atherosclerotic calcifications, numerous vascular stents, and LEFT thigh graft. IMPRESSION: Osseous demineralization with degenerative changes of LEFT hip joint and question old LEFT inferior pubic ramus fracture. No definite acute bony abnormalities. Electronically Signed   By: Lavonia Dana M.D.   On: 07/08/2018 13:24    Review of Systems  Constitutional: Negative for weight loss.  HENT: Negative for ear discharge, ear pain, hearing loss and tinnitus.   Eyes: Negative for blurred vision, double vision, photophobia and pain.  Respiratory: Negative for cough, sputum production and shortness of breath.   Cardiovascular: Negative for  chest pain.  Gastrointestinal: Negative for abdominal pain, nausea and vomiting.  Genitourinary: Negative for dysuria, flank pain, frequency and urgency.  Musculoskeletal: Positive for joint pain  (Left knee). Negative for back pain, falls, myalgias and neck pain.  Neurological: Negative for dizziness, tingling, sensory change, focal weakness, loss of consciousness and headaches.  Endo/Heme/Allergies: Does not bruise/bleed easily.  Psychiatric/Behavioral: Negative for depression, memory loss and substance abuse. The patient is not nervous/anxious.    SpO2 96 %. Physical Exam  Constitutional: She appears well-developed and well-nourished. No distress.  HENT:  Head: Normocephalic and atraumatic.  Eyes: Conjunctivae are normal. Right eye exhibits no discharge. Left eye exhibits no discharge. No scleral icterus.  Neck: Normal range of motion.  Cardiovascular: Normal rate and regular rhythm.  Respiratory: Effort normal. No respiratory distress.  Musculoskeletal:  LLE No traumatic wounds, ecchymosis, or rash  Knee diffusely TTP, med>lat  Mild knee effusion  Sens DPN, SPN, TN intact  Motor EHL, ext, flex, evers 5/5  DP 2+, PT 2+, No significant edema  Neurological: She is alert.  Skin: Skin is warm and dry. She is not diaphoretic.  Psychiatric: She has a normal mood and affect. Her behavior is normal.    Assessment/Plan: Left medial femoral condyle fx -- Will place in [place] for transfers. She should f/u with Dr. Maureen Ralphs or Dr. Lyla Glassing in 2-3 weeks.    Lisette Abu, PA-C Orthopedic Surgery [phone] 07/08/2018, 2:29 PM

## 2018-07-08 NOTE — ED Notes (Addendum)
PT called to notify of  Imminent d/c

## 2018-07-08 NOTE — ED Notes (Signed)
D/c reviewed with patient and family 

## 2018-07-08 NOTE — ED Provider Notes (Signed)
[facility] EMERGENCY DEPARTMENT Provider Note   CSN: [id number] Arrival date & time: 07/08/18  1047     History   Chief Complaint Chief Complaint  Patient presents with  . Fall    HPI [name] is a 84 y.o. female.  The history is provided by the patient, a relative and medical records.  Fall  This is a new problem. The current episode started yesterday. The problem occurs constantly. The problem has not changed since onset.Pertinent negatives include no chest pain, no abdominal pain, no headaches and no shortness of breath. Nothing aggravates the symptoms. Nothing relieves the symptoms. She has tried nothing for the symptoms. The treatment provided no relief.    Past Medical History:  Diagnosis Date  . Anemia   . Anginal pain ([place])   . Arthritis    "qwhere" (09/05/2016)  . Atrial fibrillation ([place]) 09/2016  . Chronic lower back pain   . Complication of anesthesia    "takes a long time for it to wear off; I can hallucinate if I take too much" (09/05/2016)  . DVT (deep venous thrombosis) ([place]) 10/2009  . Fall from steps 08/31/2013   Fx. pelvis, Left Hip, Left Elbow  . Fibromyalgia   . GERD (gastroesophageal reflux disease)    09/22/16- "no too much anymore"  . GI bleed 10/24/2016  . High cholesterol   . History of blood transfusion   . History of hiatal hernia   . Hypertension   . Macular degeneration, wet ([place])    "started in right eye; now legally blind in that eye; now started in left eye but pretty much in control" (09/05/2016)  . Osteoporosis   . Peripheral vascular disease ([place])    nonviable tissue Right foot  . PONV (postoperative nausea and vomiting)   . Squamous cell carcinoma of skin of right calf Aug. 2015  . Stroke [facility])    TIA's no residual  . TIA (transient ischemic attack)    "several at once; none in a long time" (09/05/2016)    Patient Active Problem List   Diagnosis Date Noted  . Plantar fasciitis of left foot  06/10/2017  . Idiopathic chronic venous hypertension of both lower extremities with inflammation 05/21/2017  . Foot drop, left 05/21/2017  . Decubitus ulcer of sacral region, unstageable ([place])   . Deep tissue injury   . Hypokalemia   . Hypoalbuminemia due to protein-calorie malnutrition ([place])   . Debilitated 01/21/2017  . Neuropathic pain   . Post-operative pain   . Slow transit constipation   . Debility   . Ischemic leg   . PAF (paroxysmal atrial fibrillation) ([place])   . Anemia of chronic disease   . Chronic pain syndrome   . Fibromyalgia   . Stage 3 chronic kidney disease ([place])   . Benign essential HTN   . Abnormal urinalysis   . PVD (peripheral vascular disease) ([place]) 01/13/2017  . Post-op pain 01/03/2017  . Phantom limb pain ([place]) 01/03/2017  . S/P unilateral BKA (below knee amputation), right ([place]) 12/30/2016  . Acute on chronic combined systolic and diastolic CHF (congestive heart failure) ([place]) 12/22/2016  . Anemia 12/22/2016  . Palliative care encounter   . Lower extremity pain, right   . Acute GI bleeding 10/23/2016  . Paroxysmal atrial fibrillation ([place]) 10/03/2016  . Atherosclerosis of autologous vein bypass graft of left lower extremity with rest pain ([place]) 09/24/2016  . PAD (peripheral artery disease) ([place]) 09/05/2016  . Groin pain 04/02/2015  .  Aftercare following surgery of the circulatory system, [place] 12/13/2013  . Shingles 10/26/2013  . Anxiety 10/26/2013  . Acute blood loss anemia 09/05/2013  . Elbow fracture, left 09/05/2013  . Left acetabular fracture ([place]) 09/05/2013  . Ankle fracture, left 09/05/2013  . HTN (hypertension) 09/03/2013  . HLD (hyperlipidemia) 09/03/2013  . Peripheral vascular disease, unspecified ([place]) 05/10/2012  . Chronic total occlusion of artery of the extremities ([place]) 01/19/2012    Past Surgical History:  Procedure Laterality Date  . ABDOMINAL AORTAGRAM N/A 12/26/2014   Procedure: ABDOMINAL Maxcine Ham;  Surgeon: Serafina Mitchell,  MD;  Location: [facility] CATH LAB;  Service: Cardiovascular;  Laterality: N/A;  . AMPUTATION Right 12/23/2016   Procedure: AMPUTATION BELOW KNEE;  Surgeon: Serafina Mitchell, MD;  Location: [place];  Service: Vascular;  Laterality: Right;  . AORTOGRAM N/A 09/26/2016   Procedure: AORTOGRAM;  Surgeon: Waynetta Sandy, MD;  Location: [place];  Service: Vascular;  Laterality: N/A;  . CARPAL TUNNEL RELEASE Right   . CATARACT EXTRACTION W/ INTRAOCULAR LENS  IMPLANT, BILATERAL Bilateral   . COLONOSCOPY    . DILATION AND CURETTAGE OF UTERUS    . DRESSING CHANGE UNDER ANESTHESIA Right 01/16/2017   Procedure: DRESSING CHANGE RIGHT BELOW KNEE AMPUTATION;  Surgeon: Serafina Mitchell, MD;  Location: [place];  Service: Vascular;  Laterality: Right;  . EYE SURGERY Right    "macular OR"  . FEMORAL ARTERY STENT  12-11-10   Left SFA  . FEMORAL-POPLITEAL BYPASS GRAFT Left 09/24/2016   Procedure: REDO FEMORAL TO POPLITEAL ARTERY BYPASS GRAFT USING 6MM PROPATEN RINGED GORTEX GRAFT;  Surgeon: Serafina Mitchell, MD;  Location: [place];  Service: Vascular;  Laterality: Left;  . FEMORAL-TIBIAL BYPASS GRAFT Left 01/16/2017   Procedure: REDO BYPASS GRAFT FEMORAL-TIBIAL ARTERY WITH GORTEX GRAFT;  Surgeon: Serafina Mitchell, MD;  Location: [place];  Service: Vascular;  Laterality: Left;  AND LOWER LEG   . I&D EXTREMITY Right 12/17/2016   Procedure: IRRIGATION AND DEBRIDEMENT RIGHT FOOT;  Surgeon: Serafina Mitchell, MD;  Location: [place];  Service: Vascular;  Laterality: Right;  . INCISION AND DRAINAGE OF WOUND Left 10/25/2009   leg/notes 11/13/2009  . INSERTION OF ILIAC STENT Left 12/26/2014   Procedure: INSERTION OF ILIAC STENT;  Surgeon: Serafina Mitchell, MD;  Location: [facility] CATH LAB;  Service: Cardiovascular;  Laterality: Left;  . INSERTION OF ILIAC STENT Left 09/26/2016   Procedure: SUB INTIMAL INSERTION OF SUPERFICIAL FEMORAL ARTERY AND BELOW KNEE BYPASS GRAFT;  Surgeon: Waynetta Sandy, MD;  Location: [place];  Service: Vascular;   Laterality: Left;  . JOINT REPLACEMENT     knee  . JOINT REPLACEMENT Left Oct. 17, 2014   Elbow ( pt fell 08-31-13 )  . ORIF SHOULDER FRACTURE Right    "it was crushed"  . PERIPHERAL VASCULAR CATHETERIZATION N/A 10/30/2015   Procedure: Abdominal Aortogram w/Lower Extremity;  Surgeon: Serafina Mitchell, MD;  Location: [place] CV LAB;  Service: Cardiovascular;  Laterality: N/A;  . PERIPHERAL VASCULAR CATHETERIZATION  10/30/2015   Procedure: Peripheral Vascular Intervention;  Surgeon: Serafina Mitchell, MD;  Location: [place] CV LAB;  Service: Cardiovascular;;  . PERIPHERAL VASCULAR CATHETERIZATION N/A 04/01/2016   Procedure: Abdominal Aortogram w/Lower Extremity;  Surgeon: Serafina Mitchell, MD;  Location: [place] CV LAB;  Service: Cardiovascular;  Laterality: N/A;  . PERIPHERAL VASCULAR CATHETERIZATION Left 04/01/2016   Procedure: Peripheral Vascular Atherectomy;  Surgeon: Serafina Mitchell, MD;  Location: [place] CV LAB;  Service:  Cardiovascular;  Laterality: Left;  Superficial femoral artery.  Marland Kitchen PERIPHERAL VASCULAR CATHETERIZATION Right 09/05/2016   "stent"  . PERIPHERAL VASCULAR CATHETERIZATION N/A 09/05/2016   Procedure: Abdominal Aortogram w/Lower Extremity;  Surgeon: Serafina Mitchell, MD;  Location: [place] CV LAB;  Service: Cardiovascular;  Laterality: N/A;  . PERIPHERAL VASCULAR CATHETERIZATION Right 09/05/2016   Procedure: Peripheral Vascular Intervention;  Surgeon: Serafina Mitchell, MD;  Location: [place] CV LAB;  Service: Cardiovascular;  Laterality: Right;  Superficial Femoral  . PERIPHERAL VASCULAR CATHETERIZATION Left 09/09/2016   Procedure: Lower Extremity Angiography;  Surgeon: Serafina Mitchell, MD;  Location: [place] CV LAB;  Service: Cardiovascular;  Laterality: Left;  . PR VEIN BYPASS GRAFT,AORTO-FEM-POP  09-13-09   Left Fem-pop  . THROMBECTOMY FEMORAL ARTERY Right 09/26/2016   Procedure: Thromboembolectomy Right Lower Extremity, Right Femoral Artery  Endarterectomy with Patch Angioplasty; Right Lower Extremity Angiogram ;  Surgeon: Waynetta Sandy, MD;  Location: [place];  Service: Vascular;  Laterality: Right;  . TOTAL ELBOW ARTHROPLASTY Left 09/03/2013   Procedure: LEFT TOTAL ELBOW ARTHROPLASTY;  Surgeon: Roseanne Kaufman, MD;  Location: [place];  Service: Orthopedics;  Laterality: Left;  . TOTAL KNEE ARTHROPLASTY Left 06/2006  . TRANSMETATARSAL AMPUTATION Right 12/11/2016   Procedure: TRANSMETATARSAL AMPUTATION;  Surgeon: Serafina Mitchell, MD;  Location: [place];  Service: Vascular;  Laterality: Right;  . TUBAL LIGATION    . VAGINAL HYSTERECTOMY       OB History   None      Home Medications    Prior to Admission medications   Medication Sig Start Date End Date Taking? Authorizing Provider  acetaminophen (TYLENOL) 500 MG tablet Take 1,000 mg by mouth 2 (two) times daily as needed for moderate pain or headache. Patient took this medication for her pain.    [provider]  Cholecalciferol (VITAMIN D3) 2000 UNITS TABS Take 2,000 Units by mouth at bedtime.     [provider]  clopidogrel (PLAVIX) 75 MG tablet Take 1 tablet (75 mg total) by mouth daily. 01/28/17   Angiulli, Lavon Paganini, PA-C  Cyanocobalamin (VITAMIN B-12 PO) Take 1 tablet by mouth daily.    [provider]  diazepam (VALIUM) 5 MG tablet Take 0.5 tablets (2.5 mg total) by mouth at bedtime as needed for anxiety. 01/28/17   Angiulli, Lavon Paganini, PA-C  gabapentin (NEURONTIN) 600 MG tablet Take 1 tablet (600 mg total) by mouth as directed. 1 tablet twice daily and 1.5 to 2 tablets at night 03/18/17 04/17/17  Meredith Staggers, MD  loperamide (IMODIUM) 2 MG capsule Take 2 mg by mouth as needed for diarrhea or loose stools.    [provider]  metoprolol tartrate (LOPRESSOR) 25 MG tablet Take 1 tablet (25 mg total) by mouth at bedtime. 01/28/17   Angiulli, Lavon Paganini, PA-C  Multiple Vitamins-Minerals (EYE VITAMINS PO) Take 1 tablet by mouth at bedtime.      [provider]  valsartan-hydrochlorothiazide (DIOVAN-HCT) 160-12.5 MG tablet Take 0.5 tablets by mouth daily. 01/28/17   Angiulli, Lavon Paganini, PA-C    Family History Family History  Problem Relation Age of Onset  . Heart disease Father        Heart Disease before age 71  . Hyperlipidemia Father   . Hypertension Father   . Alcohol abuse Father   . Heart disease Brother   . Hyperlipidemia Brother   . Hypertension Brother   . Deep vein thrombosis Brother   . Heart disease Son  Heart Disease before age 52  . Hyperlipidemia Son   . Hypertension Son   . Heart attack Son   . Diabetes Son   . Hypertension Son   . Hyperlipidemia Sister   . Hypertension Sister     Social History Social History   Tobacco Use  . Smoking status: Former Smoker    Types: Cigarettes    Last attempt to quit: 11/17/1946    Years since quitting: 71.6  . Smokeless tobacco: Never Used  . Tobacco comment: "never smoked much"  Substance Use Topics  . Alcohol use: No    Alcohol/week: 0.0 standard drinks  . Drug use: No     Allergies   Motrin [ibuprofen]; Statins; Eliquis [apixaban]; Morphine and related; Oxycontin [oxycodone hcl]; Promethazine hcl; and Sulfa antibiotics   Review of Systems Review of Systems  Constitutional: Negative for chills, diaphoresis, fatigue and fever.  HENT: Negative for congestion.   Eyes: Negative for visual disturbance.  Respiratory: Negative for cough, chest tightness, shortness of breath and wheezing.   Cardiovascular: Negative for chest pain and palpitations.  Gastrointestinal: Negative for abdominal pain, constipation, diarrhea, nausea and vomiting.  Genitourinary: Negative for dysuria and flank pain.  Musculoskeletal: Negative for back pain, neck pain and neck stiffness.  Skin: Positive for wound. Negative for rash.  Neurological: Positive for numbness (chronic leg). Negative for headaches.  Psychiatric/Behavioral: Negative for agitation.      Physical Exam Updated Vital Signs SpO2 96%   Physical Exam  Constitutional: She is oriented to person, place, and time. She appears well-developed and well-nourished. No distress.  HENT:  Head: Normocephalic and atraumatic.  Mouth/Throat: Oropharynx is clear and moist. No oropharyngeal exudate.  Eyes: Pupils are equal, round, and reactive to light. Conjunctivae and EOM are normal.  Neck: Normal range of motion. Neck supple.  Cardiovascular: Normal rate and regular rhythm.  No murmur heard. Pulmonary/Chest: Effort normal and breath sounds normal. No respiratory distress. She has no wheezes. She has no rales. She exhibits no tenderness.  Abdominal: Soft. She exhibits no distension. There is no tenderness.  Musculoskeletal: She exhibits edema and tenderness. She exhibits no deformity.       Right hip: She exhibits tenderness.       Left hip: She exhibits tenderness.       Right knee: Tenderness found.       Left knee: She exhibits swelling. Tenderness found.       Legs:      Left foot: There is tenderness.       Feet:  Neurological: She is alert and oriented to person, place, and time. She displays no tremor. A sensory deficit (numbness in L leg) is present. She exhibits normal muscle tone.  Numbness in left leg is reportedly at baseline.  Patient had weakly palpable DP pulse.  Skin: Skin is warm and dry. Capillary refill takes less than 2 seconds. No rash noted. She is not diaphoretic. No erythema.  Psychiatric: She has a normal mood and affect.  Nursing note and vitals reviewed.    ED Treatments / Results  Labs (all labs ordered are listed, but only abnormal results are displayed) Labs Reviewed  BASIC METABOLIC PANEL - Abnormal; Notable for the following components:      Result Value   Glucose, Bld 118 (*)    Creatinine, Ser 1.29 (*)    GFR calc non Af Amer 36 (*)    GFR calc Af Amer 42 (*)    All other components within normal limits  CBC WITH DIFFERENTIAL/PLATELET   PROTIME-INR    EKG None  Radiology Dg Tibia/fibula Left  Result Date: 07/08/2018 CLINICAL DATA:  LEFT hip, knee and lower leg pain post fall EXAM: LEFT TIBIA AND FIBULA - 2 VIEW COMPARISON:  LEFT ankle radiographs 02/17/2017 FINDINGS: Osseous demineralization. LEFT knee prosthesis noted. No periprosthetic lucency. Ankle joint alignment normal. No acute fracture, dislocation, or bone destruction. IMPRESSION: Osseous demineralization and LEFT knee prosthesis without acute bony abnormalities. Electronically Signed   By: Lavonia Dana M.D.   On: 07/08/2018 13:33   Dg Tibia/fibula Right  Result Date: 07/08/2018 CLINICAL DATA:  Fall, laceration to RIGHT. EXAM: RIGHT TIBIA AND FIBULA - 2 VIEW COMPARISON:  None FINDINGS: Prior RIGHT BKA. Osseous demineralization. Medial compartment joint space narrowing. No fracture, dislocation, or bone destruction. No knee joint effusion. Scattered atherosclerotic calcifications. IMPRESSION: Osseous demineralization and degenerative changes of RIGHT knee with postsurgical changes of BKA. No acute abnormalities. Electronically Signed   By: Lavonia Dana M.D.   On: 07/08/2018 13:26   Dg Knee Complete 4 Views Left  Result Date: 07/08/2018 CLINICAL DATA:  LEFT hip, knee, and lower leg pain post fall EXAM: LEFT KNEE - COMPLETE 4+ VIEW COMPARISON:  None FINDINGS: Osseous demineralization. Components of the LEFT knee prosthesis noted. Subtle fracture identified at the medial margin of the medial femoral condyle above the prosthesis. No additional fracture or dislocation. Soft tissue swelling at medial aspect of LEFT knee. No periprosthetic lucency or knee joint effusion. Extensive vascular stents with a vascular graft at the lateral aspect of the distal LEFT thigh and LEFT knee. IMPRESSION: Osseous demineralization with postsurgical changes of LEFT total knee arthroplasty. Nondisplaced fracture at the medial LEFT femoral condyle with overlying soft tissue swelling. Electronically  Signed   By: Lavonia Dana M.D.   On: 07/08/2018 13:31   Dg Foot Complete Left  Result Date: 07/08/2018 CLINICAL DATA:  LEFT hip, knee, and lower leg pain post fall EXAM: LEFT FOOT - COMPLETE 3+ VIEW COMPARISON:  02/17/2017 FINDINGS: Osseous demineralization. Joint spaces preserved. No acute fracture, dislocation, or bone destruction. IMPRESSION: No acute osseous abnormalities. Electronically Signed   By: Lavonia Dana M.D.   On: 07/08/2018 13:32   Dg Hips Bilat W Or Wo Pelvis 5 Views  Result Date: 07/08/2018 CLINICAL DATA:  LEFT hip, knee and lower leg pain post fall EXAM: DG HIP (WITH OR WITHOUT PELVIS) 5+V BILAT COMPARISON:  None FINDINGS: Diffuse osseous demineralization. LEFT SI joint poorly visualized. RIGHT SI joint unremarkable. Significant joint space narrowing LEFT hip joint. RIGHT hip joint space demonstrates minimal narrowing. No acute fracture, dislocation, or bone destruction. Question old fracture of the LEFT inferior pubic ramus. Extensive atherosclerotic calcifications, numerous vascular stents, and LEFT thigh graft. IMPRESSION: Osseous demineralization with degenerative changes of LEFT hip joint and question old LEFT inferior pubic ramus fracture. No definite acute bony abnormalities. Electronically Signed   By: Lavonia Dana M.D.   On: 07/08/2018 13:24    Procedures Procedures (including critical care time)  Medications Ordered in ED Medications  fentaNYL (SUBLIMAZE) injection 50 mcg (50 mcg Intravenous Given 07/08/18 1210)  Tdap (BOOSTRIX) injection 0.5 mL (0.5 mLs Intramuscular Given 07/08/18 1212)     Initial Impression / Assessment and Plan / ED Course  I have reviewed the triage vital signs and the nursing notes.  Pertinent labs & imaging results that were available during my care of the patient were reviewed by me and considered in my medical decision making (see chart for details).  [name] is a 84 y.o. female with a past medical history significant for  peripheral vascular disease status post right BKA and multiple stents and bypasses on Plavix, hypertension, stroke, prior DVT, GERD, atrial fibrillation, and prior GI bleed who presents with a fall.  Patient reports that yesterday afternoon, while transferring from the toilet, she had a fall.  She reports that she landed on both her right stump causing a skin tear and severe pain as well as her left hip and knee.  She reports that her knee has been swollen and she has had severe pain.  It gets up to greater than 10 out of 10 but is currently a 5 out of 10.  She hurts in both hips, her right stump, her left knee, and her left foot primarily.  She reports she cannot walk on her left side and has chronic numbness.  She reports this is not changed.  She denies any pain in her back, abdomen, or chest.  She did not hit her head or lose consciousness.  She denies other complaints.  On exam, patient has a 2 similar skin tear to her right stump.  Normal sensation in the stump.  Mild tenderness present.  Patient has bilateral hip tenderness.  Patient is tenderness in the left knee and any manipulation caused extreme pain.  Patient also had tenderness in her left foot but did not have significant ankle tenderness.  Abdomen was nontender and lungs were clear.  Chest was nontender.  Clinically I am concerned about muscular skeletal injury in both of her legs.  Will obtain x-rays of the tender areas as well as screening laboratory testing.    Tdap will be updated as I cannot find the last time the patient received this.  Patient's right leg wound will be dressed and bandaged as it has been greater than 12 hours since the skin tear and she has frail skin I would likely not be amenable to sutures anyway.  Anticipate reassessment after imaging and work-up.  2:00 PM X-ray shows evidence of a medial condyle fracture of the femur on the left knee.  There was also old left pubic ramus fracture.  Family reports this is old.      Patient has been a patient of Dr. Theda Sers and Dr. Lorre Nick [facility].  They will be called.  Anticipate she will be placed into a knee immobilizer and discharged home for outpatient Ortho follow-up.  3:35 PM Orthopedics came to the patient and feel she is appropriate for discharge and outpatient follow-up in the next 2 weeks with [facility].  They recommended knee immobilizer.  Physical therapy team is going to come help place the immobilizer and teach the patient on how to safely transfer.  Patient will be discharged home to continue outpatient pain management.  Patient will be discharged after education.  Final Clinical Impressions(s) / ED Diagnoses   Final diagnoses:  Closed displaced fracture of medial condyle of left femur, initial encounter ([place])  Fall, initial encounter    ED Discharge Orders    None     Clinical Impression: 1. Closed displaced fracture of medial condyle of left femur, initial encounter ([place])   2. Fall, initial encounter     Disposition: Discharge  Condition: Good  I have discussed the results, Dx and Tx plan with the pt(& family if present). He/she/they expressed understanding and agree(s) with the plan. Discharge instructions discussed at great length. Strict return precautions discussed and pt &/or family  have verbalized understanding of the instructions. No further questions at time of discharge.    New Prescriptions   No medications on file    Follow Up: Rod [address], [place] [id number] [phone]        Jaymison Luber, Gwenyth Allegra, MD 07/08/18 2030

## 2018-08-03 DIAGNOSIS — M25562 Pain in left knee: Secondary | ICD-10-CM | POA: Diagnosis not present

## 2018-08-03 DIAGNOSIS — S72455A Nondisplaced supracondylar fracture without intracondylar extension of lower end of left femur, initial encounter for closed fracture: Secondary | ICD-10-CM | POA: Diagnosis not present

## 2018-09-06 ENCOUNTER — Ambulatory Visit (INDEPENDENT_AMBULATORY_CARE_PROVIDER_SITE_OTHER)
Admission: RE | Admit: 2018-09-06 | Discharge: 2018-09-06 | Disposition: A | Discharge status: Home / Self Care | Payer: Medicare Other | Source: Ambulatory Visit | Attending: Surgery | Admitting: Surgery

## 2018-09-06 ENCOUNTER — Ambulatory Visit (INDEPENDENT_AMBULATORY_CARE_PROVIDER_SITE_OTHER): Payer: Medicare Other | Admitting: Surgery

## 2018-09-06 ENCOUNTER — Other Ambulatory Visit: Payer: Self-pay | Admitting: *Deleted

## 2018-09-06 ENCOUNTER — Ambulatory Visit (HOSPITAL_COMMUNITY)
Admission: RE | Admit: 2018-09-06 | Discharge: 2018-09-06 | Disposition: A | Discharge status: Home / Self Care | Payer: Medicare Other | Source: Ambulatory Visit | Attending: Surgery | Admitting: Surgery

## 2018-09-06 ENCOUNTER — Encounter: Payer: Self-pay | Admitting: *Deleted

## 2018-09-06 ENCOUNTER — Encounter: Payer: Self-pay | Admitting: Surgery

## 2018-09-06 DIAGNOSIS — I739 Peripheral vascular disease, unspecified: Secondary | ICD-10-CM

## 2018-09-06 DIAGNOSIS — Z89511 Acquired absence of right leg below knee: Secondary | ICD-10-CM

## 2018-09-06 DIAGNOSIS — I70213 Atherosclerosis of native arteries of extremities with intermittent claudication, bilateral legs: Secondary | ICD-10-CM | POA: Diagnosis not present

## 2018-09-06 NOTE — Progress Notes (Signed)
[facility] [place] HISTORY AND PHYSICAL   History of Present Illness:  Patient is a 84 y.o. year old female who presents for F/U  With repeat ABI's and By pass duplex on the left LE.  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  Bypass of LLE was then found to be occluded and she most recently she underwent left profunda to anterior tibial bypass with PTFE in March 2018.  Since last office visit she has come to the agreement with her therapy teams that she will not be able to have her walk with a prosthetic without requiring assistance insist that time has been wheelchair-bound.    She did have a fall in July of this year sustaining a non surgical left femur fracture and skin tare over the anterior right BKA stump.  She is healing well .  She has also had SOB with fluid accumulation in her lungs.  Cardiac work up did not reveal a source.  She has been placed on fluid pills.           Past Medical History:  Diagnosis Date  . Anemia   . Anginal pain ([place])   . Arthritis    "qwhere" (09/05/2016)  . Atrial fibrillation ([place]) 09/2016  . Chronic lower back pain   . Complication of anesthesia    "takes a long time for it to wear off; I can hallucinate if I take too much" (09/05/2016)  . DVT (deep venous thrombosis) ([place]) 10/2009  . Fall from steps 08/31/2013   Fx. pelvis, Left Hip, Left Elbow  . Fibromyalgia   . GERD (gastroesophageal reflux disease)    09/22/16- "no too much anymore"  . GI bleed 10/24/2016  . High cholesterol   . History of blood transfusion   . History of hiatal hernia   . Hypertension   . Macular degeneration, wet ([place])    "started in right eye; now legally blind in that eye; now started in left eye but pretty much in control"  (09/05/2016)  . Osteoporosis   . Peripheral vascular disease ([place])    nonviable tissue Right foot  . PONV (postoperative nausea and vomiting)   . Squamous cell carcinoma of skin of right calf Aug. 2015  . Stroke ([place])    TIA's no residual  . TIA (transient ischemic attack)    "several at once; none in a long time" (09/05/2016)    Past Surgical History:  Procedure Laterality Date  . ABDOMINAL AORTAGRAM N/A 12/26/2014   Procedure: ABDOMINAL Maxcine Ham;  Surgeon: Serafina Mitchell, MD;  Location: [facility] CATH LAB;  Service: Cardiovascular;  Laterality: N/A;  . AMPUTATION Right 12/23/2016   Procedure: AMPUTATION BELOW KNEE;  Surgeon: Serafina Mitchell, MD;  Location: [place];  Service: Vascular;  Laterality: Right;  . AORTOGRAM N/A 09/26/2016   Procedure: AORTOGRAM;  Surgeon: Waynetta Sandy, MD;  Location: [place];  Service: Vascular;  Laterality: N/A;  . CARPAL TUNNEL RELEASE Right   . CATARACT EXTRACTION W/ INTRAOCULAR LENS  IMPLANT, BILATERAL Bilateral   . COLONOSCOPY    . DILATION AND CURETTAGE OF UTERUS    . DRESSING CHANGE UNDER ANESTHESIA Right 01/16/2017   Procedure: DRESSING CHANGE RIGHT BELOW KNEE AMPUTATION;  Surgeon: Serafina Mitchell, MD;  Location: [place];  Service: Vascular;  Laterality: Right;  .  EYE SURGERY Right    "macular OR"  . FEMORAL ARTERY STENT  12-11-10   Left SFA  . FEMORAL-POPLITEAL BYPASS GRAFT Left 09/24/2016   Procedure: REDO FEMORAL TO POPLITEAL ARTERY BYPASS GRAFT USING 6MM PROPATEN RINGED GORTEX GRAFT;  Surgeon: Serafina Mitchell, MD;  Location: [place];  Service: Vascular;  Laterality: Left;  . FEMORAL-TIBIAL BYPASS GRAFT Left 01/16/2017   Procedure: REDO BYPASS GRAFT FEMORAL-TIBIAL ARTERY WITH GORTEX GRAFT;  Surgeon: Serafina Mitchell, MD;  Location: [place];  Service: Vascular;  Laterality: Left;  AND LOWER LEG   . I&D EXTREMITY Right 12/17/2016   Procedure: IRRIGATION AND DEBRIDEMENT RIGHT FOOT;  Surgeon: Serafina Mitchell, MD;  Location: [place];  Service: Vascular;   Laterality: Right;  . INCISION AND DRAINAGE OF WOUND Left 10/25/2009   leg/notes 11/13/2009  . INSERTION OF ILIAC STENT Left 12/26/2014   Procedure: INSERTION OF ILIAC STENT;  Surgeon: Serafina Mitchell, MD;  Location: [facility] CATH LAB;  Service: Cardiovascular;  Laterality: Left;  . INSERTION OF ILIAC STENT Left 09/26/2016   Procedure: SUB INTIMAL INSERTION OF SUPERFICIAL FEMORAL ARTERY AND BELOW KNEE BYPASS GRAFT;  Surgeon: Waynetta Sandy, MD;  Location: [place];  Service: Vascular;  Laterality: Left;  . JOINT REPLACEMENT     knee  . JOINT REPLACEMENT Left Oct. 17, 2014   Elbow ( pt fell 08-31-13 )  . ORIF SHOULDER FRACTURE Right    "it was crushed"  . PERIPHERAL VASCULAR CATHETERIZATION N/A 10/30/2015   Procedure: Abdominal Aortogram w/Lower Extremity;  Surgeon: Serafina Mitchell, MD;  Location: [place] CV LAB;  Service: Cardiovascular;  Laterality: N/A;  . PERIPHERAL VASCULAR CATHETERIZATION  10/30/2015   Procedure: Peripheral Vascular Intervention;  Surgeon: Serafina Mitchell, MD;  Location: [place] CV LAB;  Service: Cardiovascular;;  . PERIPHERAL VASCULAR CATHETERIZATION N/A 04/01/2016   Procedure: Abdominal Aortogram w/Lower Extremity;  Surgeon: Serafina Mitchell, MD;  Location: [place] CV LAB;  Service: Cardiovascular;  Laterality: N/A;  . PERIPHERAL VASCULAR CATHETERIZATION Left 04/01/2016   Procedure: Peripheral Vascular Atherectomy;  Surgeon: Serafina Mitchell, MD;  Location: [place] CV LAB;  Service: Cardiovascular;  Laterality: Left;  Superficial femoral artery.  Marland Kitchen PERIPHERAL VASCULAR CATHETERIZATION Right 09/05/2016   "stent"  . PERIPHERAL VASCULAR CATHETERIZATION N/A 09/05/2016   Procedure: Abdominal Aortogram w/Lower Extremity;  Surgeon: Serafina Mitchell, MD;  Location: [place] CV LAB;  Service: Cardiovascular;  Laterality: N/A;  . PERIPHERAL VASCULAR CATHETERIZATION Right 09/05/2016   Procedure: Peripheral Vascular Intervention;  Surgeon: Serafina Mitchell, MD;   Location: [place] CV LAB;  Service: Cardiovascular;  Laterality: Right;  Superficial Femoral  . PERIPHERAL VASCULAR CATHETERIZATION Left 09/09/2016   Procedure: Lower Extremity Angiography;  Surgeon: Serafina Mitchell, MD;  Location: [place] CV LAB;  Service: Cardiovascular;  Laterality: Left;  . PR VEIN BYPASS GRAFT,AORTO-FEM-POP  09-13-09   Left Fem-pop  . THROMBECTOMY FEMORAL ARTERY Right 09/26/2016   Procedure: Thromboembolectomy Right Lower Extremity, Right Femoral Artery Endarterectomy with Patch Angioplasty; Right Lower Extremity Angiogram ;  Surgeon: Waynetta Sandy, MD;  Location: [facility];  Service: Vascular;  Laterality: Right;  . TOTAL ELBOW ARTHROPLASTY Left 09/03/2013   Procedure: LEFT TOTAL ELBOW ARTHROPLASTY;  Surgeon: Roseanne Kaufman, MD;  Location: [place];  Service: Orthopedics;  Laterality: Left;  . TOTAL KNEE ARTHROPLASTY Left 06/2006  . TRANSMETATARSAL AMPUTATION Right 12/11/2016   Procedure: TRANSMETATARSAL AMPUTATION;  Surgeon: Serafina Mitchell, MD;  Location: [place];  Service: Vascular;  Laterality:  Right;  Marland Kitchen TUBAL LIGATION    . VAGINAL HYSTERECTOMY      ROS:   General:  No weight loss, Fever, chills  HEENT: No recent headaches, no nasal bleeding, no visual changes, no sore throat  Neurologic: No dizziness, blackouts, seizures. No recent symptoms of stroke or mini- stroke. No recent episodes of slurred speech, or temporary blindness.  Cardiac: No recent episodes of chest pain/pressure, no shortness of breath at rest.  No shortness of breath with exertion.  Denies history of atrial fibrillation or irregular heartbeat  Vascular: No history of rest pain in feet.  No history of claudication.  No history of non-healing ulcer, No history of DVT   Pulmonary: No home oxygen, no productive cough, no hemoptysis,  No asthma or wheezing  Musculoskeletal:  [ ]  Arthritis, [ ]  Low back pain,  [ ]  Joint pain  Hematologic:No history of hypercoagulable state.  No history  of easy bleeding.  No history of anemia  Gastrointestinal: No hematochezia or melena,  No gastroesophageal reflux, no trouble swallowing  Urinary: [ ]  chronic Kidney disease, [ ]  on HD - [ ]  MWF or [ ]  TTHS, [ ]  Burning with urination, [ ]  Frequent urination, [ ]  Difficulty urinating;   Skin: No rashes  Psychological: No history of anxiety,  No history of depression  Social History Social History   Tobacco Use  . Smoking status: Former Smoker    Types: Cigarettes    Last attempt to quit: 11/17/1946    Years since quitting: 71.8  . Smokeless tobacco: Never Used  . Tobacco comment: "never smoked much"  Substance Use Topics  . Alcohol use: No    Alcohol/week: 0.0 standard drinks  . Drug use: No    Family History Family History  Problem Relation Age of Onset  . Heart disease Father        Heart Disease before age 43  . Hyperlipidemia Father   . Hypertension Father   . Alcohol abuse Father   . Heart disease Brother   . Hyperlipidemia Brother   . Hypertension Brother   . Deep vein thrombosis Brother   . Heart disease Son        Heart Disease before age 77  . Hyperlipidemia Son   . Hypertension Son   . Heart attack Son   . Diabetes Son   . Hypertension Son   . Hyperlipidemia Sister   . Hypertension Sister     Allergies  Allergies  Allergen Reactions  . Motrin [Ibuprofen] Other (See Comments)    ADVERSE REACTION - GI BLEED  . Statins Other (See Comments)    ADVERSE REACTION MUSCLE PAIN & WEAKNESS  . Eliquis [Apixaban] Other (See Comments)    Lower GI bleeding  . Morphine And Related Other (See Comments)    HALLUCINATIONS REACTION IS SIDE EFFECT  . Oxycontin [Oxycodone Hcl] Other (See Comments)    [REACTION IS SIDE EFFECT]  > HALLUCINATIONS  . Promethazine Hcl Other (See Comments)    IV  Drug only, makes her act crazy  . Sulfa Antibiotics Nausea And Vomiting     Current Outpatient Medications  Medication Sig Dispense Refill  . acetaminophen (TYLENOL) 500  MG tablet Take 1,000 mg by mouth 2 (two) times daily as needed for moderate pain or headache. Patient took this medication for her pain.    . Cholecalciferol (VITAMIN D3) 2000 UNITS TABS Take 2,000 Units by mouth at bedtime.     . clopidogrel (PLAVIX) 75 MG tablet  Take 1 tablet (75 mg total) by mouth daily. 30 tablet 1  . Cyanocobalamin (VITAMIN B-12 PO) Take 1 tablet by mouth daily.    . diazepam (VALIUM) 5 MG tablet Take 0.5 tablets (2.5 mg total) by mouth at bedtime as needed for anxiety. 20 tablet 0  . gabapentin (NEURONTIN) 600 MG tablet Take 1 tablet (600 mg total) by mouth as directed. 1 tablet twice daily and 1.5 to 2 tablets at night 120 tablet 1  . loperamide (IMODIUM) 2 MG capsule Take 2 mg by mouth as needed for diarrhea or loose stools.    . metoprolol tartrate (LOPRESSOR) 25 MG tablet Take 1 tablet (25 mg total) by mouth at bedtime. 30 tablet 1  . Multiple Vitamins-Minerals (EYE VITAMINS PO) Take 1 tablet by mouth at bedtime.     . valsartan-hydrochlorothiazide (DIOVAN-HCT) 160-12.5 MG tablet Take 0.5 tablets by mouth daily. 30 tablet 0   No current facility-administered medications for this visit.     Physical Examination  There were no vitals filed for this visit.  There is no height or weight on file to calculate BMI.  General:  Alert and oriented, no acute distress HEENT: Normal Neck: No bruit or JVD Pulmonary: Clear to auscultation bilaterally, no rhonchi or wheezing Cardiac: Regular Rate and Rhythm without murmur Abdomen: Soft, non-tender, non-distended, no mass,  Small purplish scar right anterior BKA, stump is slightly tender, warm to touch. Skin: No rash Extremity Pulses:  2+ radial, brachial, femoral, Left dorsalis pedis pulse Musculoskeletal: no Left LE edema  Neurologic: Upper and lower extremity motor intact without new weakness.  DATA:  Bypass duplex shows 70 % stenosis at the proximal anastomosis with PSV of 542.  Previous PSV was 330cm/s ABI's Left 0.85  and TBI 0.63 Previous 0.90 with TBI 0.54   ASSESSMENT:  PAD with hx of right BKA Left Fem-pop redo left profunda to anterior tibial bypass with PTFE in March 2018.   PLAN:  The ABI's are basically unchanged with palpable DP.  The proximal anastomosis shows increased stenosis from her previous visit.  After exam and review with [name] today in clinic we will schedule her for aortoangiogram with left LE runoff and possible intervention.   Her most recent Cr is 1.29.     Roxy Horseman PA-C [facility] [place] Office: [phone]  The patient was seen in conjunction with [name] today in clinic.  The patient has elevated velocities of the proximal anastomosis of her left leg bypass.  We discussed proceeding with angiographic evaluation and possible intervention.  I am going to try to schedule this on a Wednesday in order to accommodate her daughter schedule  [name]

## 2018-09-06 NOTE — H&P (View-Only) (Signed)
[facility] [place] HISTORY AND PHYSICAL   History of Present Illness:  Patient is a 84 y.o. year old female who presents for F/U  With repeat ABI's and By pass duplex on the left LE.  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  Bypass of LLE was then found to be occluded and she most recently she underwent left profunda to anterior tibial bypass with PTFE in March 2018.  Since last office visit she has come to the agreement with her therapy teams that she will not be able to have her walk with a prosthetic without requiring assistance insist that time has been wheelchair-bound.    She did have a fall in July of this year sustaining a non surgical left femur fracture and skin tare over the anterior right BKA stump.  She is healing well .  She has also had SOB with fluid accumulation in her lungs.  Cardiac work up did not reveal a source.  She has been placed on fluid pills.           Past Medical History:  Diagnosis Date  . Anemia   . Anginal pain ([place])   . Arthritis    "qwhere" (09/05/2016)  . Atrial fibrillation ([place]) 09/2016  . Chronic lower back pain   . Complication of anesthesia    "takes a long time for it to wear off; I can hallucinate if I take too much" (09/05/2016)  . DVT (deep venous thrombosis) ([place]) 10/2009  . Fall from steps 08/31/2013   Fx. pelvis, Left Hip, Left Elbow  . Fibromyalgia   . GERD (gastroesophageal reflux disease)    09/22/16- "no too much anymore"  . GI bleed 10/24/2016  . High cholesterol   . History of blood transfusion   . History of hiatal hernia   . Hypertension   . Macular degeneration, wet ([place])    "started in right eye; now legally blind in that eye; now started in left eye but pretty much in control"  (09/05/2016)  . Osteoporosis   . Peripheral vascular disease ([place])    nonviable tissue Right foot  . PONV (postoperative nausea and vomiting)   . Squamous cell carcinoma of skin of right calf Aug. 2015  . Stroke ([place])    TIA's no residual  . TIA (transient ischemic attack)    "several at once; none in a long time" (09/05/2016)    Past Surgical History:  Procedure Laterality Date  . ABDOMINAL AORTAGRAM N/A 12/26/2014   Procedure: ABDOMINAL Maxcine Ham;  Surgeon: Serafina Mitchell, MD;  Location: [facility] CATH LAB;  Service: Cardiovascular;  Laterality: N/A;  . AMPUTATION Right 12/23/2016   Procedure: AMPUTATION BELOW KNEE;  Surgeon: Serafina Mitchell, MD;  Location: [place];  Service: Vascular;  Laterality: Right;  . AORTOGRAM N/A 09/26/2016   Procedure: AORTOGRAM;  Surgeon: Waynetta Sandy, MD;  Location: [place];  Service: Vascular;  Laterality: N/A;  . CARPAL TUNNEL RELEASE Right   . CATARACT EXTRACTION W/ INTRAOCULAR LENS  IMPLANT, BILATERAL Bilateral   . COLONOSCOPY    . DILATION AND CURETTAGE OF UTERUS    . DRESSING CHANGE UNDER ANESTHESIA Right 01/16/2017   Procedure: DRESSING CHANGE RIGHT BELOW KNEE AMPUTATION;  Surgeon: Serafina Mitchell, MD;  Location: [place];  Service: Vascular;  Laterality: Right;  .  EYE SURGERY Right    "macular OR"  . FEMORAL ARTERY STENT  12-11-10   Left SFA  . FEMORAL-POPLITEAL BYPASS GRAFT Left 09/24/2016   Procedure: REDO FEMORAL TO POPLITEAL ARTERY BYPASS GRAFT USING 6MM PROPATEN RINGED GORTEX GRAFT;  Surgeon: Serafina Mitchell, MD;  Location: [place];  Service: Vascular;  Laterality: Left;  . FEMORAL-TIBIAL BYPASS GRAFT Left 01/16/2017   Procedure: REDO BYPASS GRAFT FEMORAL-TIBIAL ARTERY WITH GORTEX GRAFT;  Surgeon: Serafina Mitchell, MD;  Location: [place];  Service: Vascular;  Laterality: Left;  AND LOWER LEG   . I&D EXTREMITY Right 12/17/2016   Procedure: IRRIGATION AND DEBRIDEMENT RIGHT FOOT;  Surgeon: Serafina Mitchell, MD;  Location: [place];  Service: Vascular;   Laterality: Right;  . INCISION AND DRAINAGE OF WOUND Left 10/25/2009   leg/notes 11/13/2009  . INSERTION OF ILIAC STENT Left 12/26/2014   Procedure: INSERTION OF ILIAC STENT;  Surgeon: Serafina Mitchell, MD;  Location: [facility] CATH LAB;  Service: Cardiovascular;  Laterality: Left;  . INSERTION OF ILIAC STENT Left 09/26/2016   Procedure: SUB INTIMAL INSERTION OF SUPERFICIAL FEMORAL ARTERY AND BELOW KNEE BYPASS GRAFT;  Surgeon: Waynetta Sandy, MD;  Location: [place];  Service: Vascular;  Laterality: Left;  . JOINT REPLACEMENT     knee  . JOINT REPLACEMENT Left Oct. 17, 2014   Elbow ( pt fell 08-31-13 )  . ORIF SHOULDER FRACTURE Right    "it was crushed"  . PERIPHERAL VASCULAR CATHETERIZATION N/A 10/30/2015   Procedure: Abdominal Aortogram w/Lower Extremity;  Surgeon: Serafina Mitchell, MD;  Location: [place] CV LAB;  Service: Cardiovascular;  Laterality: N/A;  . PERIPHERAL VASCULAR CATHETERIZATION  10/30/2015   Procedure: Peripheral Vascular Intervention;  Surgeon: Serafina Mitchell, MD;  Location: [place] CV LAB;  Service: Cardiovascular;;  . PERIPHERAL VASCULAR CATHETERIZATION N/A 04/01/2016   Procedure: Abdominal Aortogram w/Lower Extremity;  Surgeon: Serafina Mitchell, MD;  Location: [place] CV LAB;  Service: Cardiovascular;  Laterality: N/A;  . PERIPHERAL VASCULAR CATHETERIZATION Left 04/01/2016   Procedure: Peripheral Vascular Atherectomy;  Surgeon: Serafina Mitchell, MD;  Location: [place] CV LAB;  Service: Cardiovascular;  Laterality: Left;  Superficial femoral artery.  Marland Kitchen PERIPHERAL VASCULAR CATHETERIZATION Right 09/05/2016   "stent"  . PERIPHERAL VASCULAR CATHETERIZATION N/A 09/05/2016   Procedure: Abdominal Aortogram w/Lower Extremity;  Surgeon: Serafina Mitchell, MD;  Location: [place] CV LAB;  Service: Cardiovascular;  Laterality: N/A;  . PERIPHERAL VASCULAR CATHETERIZATION Right 09/05/2016   Procedure: Peripheral Vascular Intervention;  Surgeon: Serafina Mitchell, MD;   Location: [place] CV LAB;  Service: Cardiovascular;  Laterality: Right;  Superficial Femoral  . PERIPHERAL VASCULAR CATHETERIZATION Left 09/09/2016   Procedure: Lower Extremity Angiography;  Surgeon: Serafina Mitchell, MD;  Location: [place] CV LAB;  Service: Cardiovascular;  Laterality: Left;  . PR VEIN BYPASS GRAFT,AORTO-FEM-POP  09-13-09   Left Fem-pop  . THROMBECTOMY FEMORAL ARTERY Right 09/26/2016   Procedure: Thromboembolectomy Right Lower Extremity, Right Femoral Artery Endarterectomy with Patch Angioplasty; Right Lower Extremity Angiogram ;  Surgeon: Waynetta Sandy, MD;  Location: [place];  Service: Vascular;  Laterality: Right;  . TOTAL ELBOW ARTHROPLASTY Left 09/03/2013   Procedure: LEFT TOTAL ELBOW ARTHROPLASTY;  Surgeon: Roseanne Kaufman, MD;  Location: [place];  Service: Orthopedics;  Laterality: Left;  . TOTAL KNEE ARTHROPLASTY Left 06/2006  . TRANSMETATARSAL AMPUTATION Right 12/11/2016   Procedure: TRANSMETATARSAL AMPUTATION;  Surgeon: Serafina Mitchell, MD;  Location: [place];  Service: Vascular;  Laterality:  Right;  Marland Kitchen TUBAL LIGATION    . VAGINAL HYSTERECTOMY      ROS:   General:  No weight loss, Fever, chills  HEENT: No recent headaches, no nasal bleeding, no visual changes, no sore throat  Neurologic: No dizziness, blackouts, seizures. No recent symptoms of stroke or mini- stroke. No recent episodes of slurred speech, or temporary blindness.  Cardiac: No recent episodes of chest pain/pressure, no shortness of breath at rest.  No shortness of breath with exertion.  Denies history of atrial fibrillation or irregular heartbeat  Vascular: No history of rest pain in feet.  No history of claudication.  No history of non-healing ulcer, No history of DVT   Pulmonary: No home oxygen, no productive cough, no hemoptysis,  No asthma or wheezing  Musculoskeletal:  [ ]  Arthritis, [ ]  Low back pain,  [ ]  Joint pain  Hematologic:No history of hypercoagulable state.  No history  of easy bleeding.  No history of anemia  Gastrointestinal: No hematochezia or melena,  No gastroesophageal reflux, no trouble swallowing  Urinary: [ ]  chronic Kidney disease, [ ]  on HD - [ ]  MWF or [ ]  TTHS, [ ]  Burning with urination, [ ]  Frequent urination, [ ]  Difficulty urinating;   Skin: No rashes  Psychological: No history of anxiety,  No history of depression  Social History Social History   Tobacco Use  . Smoking status: Former Smoker    Types: Cigarettes    Last attempt to quit: 11/17/1946    Years since quitting: 71.8  . Smokeless tobacco: Never Used  . Tobacco comment: "never smoked much"  Substance Use Topics  . Alcohol use: No    Alcohol/week: 0.0 standard drinks  . Drug use: No    Family History Family History  Problem Relation Age of Onset  . Heart disease Father        Heart Disease before age 79  . Hyperlipidemia Father   . Hypertension Father   . Alcohol abuse Father   . Heart disease Brother   . Hyperlipidemia Brother   . Hypertension Brother   . Deep vein thrombosis Brother   . Heart disease Son        Heart Disease before age 41  . Hyperlipidemia Son   . Hypertension Son   . Heart attack Son   . Diabetes Son   . Hypertension Son   . Hyperlipidemia Sister   . Hypertension Sister     Allergies  Allergies  Allergen Reactions  . Motrin [Ibuprofen] Other (See Comments)    ADVERSE REACTION - GI BLEED  . Statins Other (See Comments)    ADVERSE REACTION MUSCLE PAIN & WEAKNESS  . Eliquis [Apixaban] Other (See Comments)    Lower GI bleeding  . Morphine And Related Other (See Comments)    HALLUCINATIONS REACTION IS SIDE EFFECT  . Oxycontin [Oxycodone Hcl] Other (See Comments)    [REACTION IS SIDE EFFECT]  > HALLUCINATIONS  . Promethazine Hcl Other (See Comments)    IV  Drug only, makes her act crazy  . Sulfa Antibiotics Nausea And Vomiting     Current Outpatient Medications  Medication Sig Dispense Refill  . acetaminophen (TYLENOL) 500  MG tablet Take 1,000 mg by mouth 2 (two) times daily as needed for moderate pain or headache. Patient took this medication for her pain.    . Cholecalciferol (VITAMIN D3) 2000 UNITS TABS Take 2,000 Units by mouth at bedtime.     . clopidogrel (PLAVIX) 75 MG tablet  Take 1 tablet (75 mg total) by mouth daily. 30 tablet 1  . Cyanocobalamin (VITAMIN B-12 PO) Take 1 tablet by mouth daily.    . diazepam (VALIUM) 5 MG tablet Take 0.5 tablets (2.5 mg total) by mouth at bedtime as needed for anxiety. 20 tablet 0  . gabapentin (NEURONTIN) 600 MG tablet Take 1 tablet (600 mg total) by mouth as directed. 1 tablet twice daily and 1.5 to 2 tablets at night 120 tablet 1  . loperamide (IMODIUM) 2 MG capsule Take 2 mg by mouth as needed for diarrhea or loose stools.    . metoprolol tartrate (LOPRESSOR) 25 MG tablet Take 1 tablet (25 mg total) by mouth at bedtime. 30 tablet 1  . Multiple Vitamins-Minerals (EYE VITAMINS PO) Take 1 tablet by mouth at bedtime.     . valsartan-hydrochlorothiazide (DIOVAN-HCT) 160-12.5 MG tablet Take 0.5 tablets by mouth daily. 30 tablet 0   No current facility-administered medications for this visit.     Physical Examination  There were no vitals filed for this visit.  There is no height or weight on file to calculate BMI.  General:  Alert and oriented, no acute distress HEENT: Normal Neck: No bruit or JVD Pulmonary: Clear to auscultation bilaterally, no rhonchi or wheezing Cardiac: Regular Rate and Rhythm without murmur Abdomen: Soft, non-tender, non-distended, no mass,  Small purplish scar right anterior BKA, stump is slightly tender, warm to touch. Skin: No rash Extremity Pulses:  2+ radial, brachial, femoral, Left dorsalis pedis pulse Musculoskeletal: no Left LE edema  Neurologic: Upper and lower extremity motor intact without new weakness.  DATA:  Bypass duplex shows 70 % stenosis at the proximal anastomosis with PSV of 542.  Previous PSV was 330cm/s ABI's Left 0.85  and TBI 0.63 Previous 0.90 with TBI 0.54   ASSESSMENT:  PAD with hx of right BKA Left Fem-pop redo left profunda to anterior tibial bypass with PTFE in March 2018.   PLAN:  The ABI's are basically unchanged with palpable DP.  The proximal anastomosis shows increased stenosis from her previous visit.  After exam and review with [name] today in clinic we will schedule her for aortoangiogram with left LE runoff and possible intervention.   Her most recent Cr is 1.29.     Roxy Horseman PA-C [facility] [place] Office: [phone]  The patient was seen in conjunction with [name] today in clinic.  The patient has elevated velocities of the proximal anastomosis of her left leg bypass.  We discussed proceeding with angiographic evaluation and possible intervention.  I am going to try to schedule this on a Wednesday in order to accommodate her daughter schedule  [name]

## 2018-09-21 ENCOUNTER — Ambulatory Visit (HOSPITAL_COMMUNITY)
Admission: RE | Admit: 2018-09-21 | Discharge: 2018-09-21 | Disposition: A | Discharge status: Home / Self Care | Payer: Medicare Other | Source: Ambulatory Visit | Attending: Surgery | Admitting: Surgery

## 2018-09-21 ENCOUNTER — Encounter (HOSPITAL_COMMUNITY)
Admission: RE | Disposition: A | Discharge status: Home / Self Care | Payer: Self-pay | Source: Ambulatory Visit | Attending: Surgery

## 2018-09-21 DIAGNOSIS — Z8249 Family history of ischemic heart disease and other diseases of the circulatory system: Secondary | ICD-10-CM | POA: Insufficient documentation

## 2018-09-21 DIAGNOSIS — I70502 Unspecified atherosclerosis of nonautologous biological bypass graft(s) of the extremities, left leg: Secondary | ICD-10-CM | POA: Diagnosis not present

## 2018-09-21 DIAGNOSIS — Z89511 Acquired absence of right leg below knee: Secondary | ICD-10-CM | POA: Diagnosis not present

## 2018-09-21 DIAGNOSIS — Z9842 Cataract extraction status, left eye: Secondary | ICD-10-CM | POA: Insufficient documentation

## 2018-09-21 DIAGNOSIS — Z888 Allergy status to other drugs, medicaments and biological substances status: Secondary | ICD-10-CM | POA: Diagnosis not present

## 2018-09-21 DIAGNOSIS — E7800 Pure hypercholesterolemia, unspecified: Secondary | ICD-10-CM | POA: Diagnosis not present

## 2018-09-21 DIAGNOSIS — Z90710 Acquired absence of both cervix and uterus: Secondary | ICD-10-CM | POA: Insufficient documentation

## 2018-09-21 DIAGNOSIS — M797 Fibromyalgia: Secondary | ICD-10-CM | POA: Diagnosis not present

## 2018-09-21 DIAGNOSIS — I700 Atherosclerosis of aorta: Secondary | ICD-10-CM | POA: Diagnosis not present

## 2018-09-21 DIAGNOSIS — Z86718 Personal history of other venous thrombosis and embolism: Secondary | ICD-10-CM | POA: Insufficient documentation

## 2018-09-21 DIAGNOSIS — I4891 Unspecified atrial fibrillation: Secondary | ICD-10-CM | POA: Diagnosis not present

## 2018-09-21 DIAGNOSIS — M1990 Unspecified osteoarthritis, unspecified site: Secondary | ICD-10-CM | POA: Diagnosis not present

## 2018-09-21 DIAGNOSIS — Z886 Allergy status to analgesic agent status: Secondary | ICD-10-CM | POA: Diagnosis not present

## 2018-09-21 DIAGNOSIS — Z87891 Personal history of nicotine dependence: Secondary | ICD-10-CM | POA: Insufficient documentation

## 2018-09-21 DIAGNOSIS — I10 Essential (primary) hypertension: Secondary | ICD-10-CM | POA: Insufficient documentation

## 2018-09-21 DIAGNOSIS — K219 Gastro-esophageal reflux disease without esophagitis: Secondary | ICD-10-CM | POA: Diagnosis not present

## 2018-09-21 DIAGNOSIS — Z882 Allergy status to sulfonamides status: Secondary | ICD-10-CM | POA: Insufficient documentation

## 2018-09-21 DIAGNOSIS — Z8673 Personal history of transient ischemic attack (TIA), and cerebral infarction without residual deficits: Secondary | ICD-10-CM | POA: Diagnosis not present

## 2018-09-21 DIAGNOSIS — Z96652 Presence of left artificial knee joint: Secondary | ICD-10-CM | POA: Diagnosis not present

## 2018-09-21 DIAGNOSIS — Z85828 Personal history of other malignant neoplasm of skin: Secondary | ICD-10-CM | POA: Diagnosis not present

## 2018-09-21 DIAGNOSIS — Z9889 Other specified postprocedural states: Secondary | ICD-10-CM | POA: Insufficient documentation

## 2018-09-21 DIAGNOSIS — H547 Unspecified visual loss: Secondary | ICD-10-CM | POA: Diagnosis not present

## 2018-09-21 DIAGNOSIS — Z79899 Other long term (current) drug therapy: Secondary | ICD-10-CM | POA: Insufficient documentation

## 2018-09-21 DIAGNOSIS — Z96622 Presence of left artificial elbow joint: Secondary | ICD-10-CM | POA: Diagnosis not present

## 2018-09-21 DIAGNOSIS — Z7902 Long term (current) use of antithrombotics/antiplatelets: Secondary | ICD-10-CM | POA: Diagnosis not present

## 2018-09-21 DIAGNOSIS — Z885 Allergy status to narcotic agent status: Secondary | ICD-10-CM | POA: Diagnosis not present

## 2018-09-21 DIAGNOSIS — Z9841 Cataract extraction status, right eye: Secondary | ICD-10-CM | POA: Insufficient documentation

## 2018-09-21 HISTORY — PX: LOWER EXTREMITY ANGIOGRAPHY: CATH118251

## 2018-09-21 LAB — POCT I-STAT, CHEM 8
BUN: 26 mg/dL — AB (ref 8–23)
CHLORIDE: 106 mmol/L (ref 98–111)
CREATININE: 1.5 mg/dL — AB (ref 0.44–1.00)
Calcium, Ion: 1.26 mmol/L (ref 1.15–1.40)
GLUCOSE: 101 mg/dL — AB (ref 70–99)
HCT: 39 % (ref 36.0–46.0)
Hemoglobin: 13.3 g/dL (ref 12.0–15.0)
POTASSIUM: 3.8 mmol/L (ref 3.5–5.1)
Sodium: 140 mmol/L (ref 135–145)
TCO2: 25 mmol/L (ref 22–32)

## 2018-09-21 SURGERY — LOWER EXTREMITY ANGIOGRAPHY
Anesthesia: LOCAL

## 2018-09-21 MED ORDER — IODIXANOL 320 MG/ML IV SOLN
INTRAVENOUS | Status: DC | PRN
  Administered 2018-09-21: 43 mL via INTRA_ARTERIAL

## 2018-09-21 MED ORDER — ACETAMINOPHEN 325 MG PO TABS: 650.0000 mg | ORAL_TABLET | ORAL | Status: DC | PRN

## 2018-09-21 MED ORDER — ONDANSETRON HCL 4 MG/2ML IJ SOLN
INTRAMUSCULAR | Status: AC
  Filled 2018-09-21: qty 2

## 2018-09-21 MED ORDER — FENTANYL CITRATE (PF) 100 MCG/2ML IJ SOLN
INTRAMUSCULAR | Status: AC
  Filled 2018-09-21: qty 2

## 2018-09-21 MED ORDER — SODIUM CHLORIDE 0.9 % IV SOLN
INTRAVENOUS | Status: DC
  Administered 2018-09-21: 06:00:00 via INTRAVENOUS

## 2018-09-21 MED ORDER — SODIUM CHLORIDE 0.9% FLUSH: 3.0000 mL | INTRAVENOUS | Status: DC | PRN

## 2018-09-21 MED ORDER — LIDOCAINE HCL (PF) 1 % IJ SOLN
INTRAMUSCULAR | Status: AC
  Filled 2018-09-21: qty 30

## 2018-09-21 MED ORDER — HEPARIN (PORCINE) IN NACL 1000-0.9 UT/500ML-% IV SOLN
INTRAVENOUS | Status: AC
  Filled 2018-09-21: qty 500

## 2018-09-21 MED ORDER — HEPARIN (PORCINE) IN NACL 1000-0.9 UT/500ML-% IV SOLN
INTRAVENOUS | Status: DC | PRN
  Administered 2018-09-21 (×2): 500 mL

## 2018-09-21 MED ORDER — LABETALOL HCL 5 MG/ML IV SOLN: 10.0000 mg | INTRAVENOUS | Status: DC | PRN

## 2018-09-21 MED ORDER — LIDOCAINE HCL (PF) 1 % IJ SOLN
INTRAMUSCULAR | Status: DC | PRN
  Administered 2018-09-21: 20 mL via INTRADERMAL

## 2018-09-21 MED ORDER — FENTANYL CITRATE (PF) 100 MCG/2ML IJ SOLN
INTRAMUSCULAR | Status: DC | PRN
  Administered 2018-09-21 (×2): 25 ug via INTRAVENOUS

## 2018-09-21 MED ORDER — SODIUM CHLORIDE 0.9% FLUSH: 3.0000 mL | Freq: Two times a day (BID) | INTRAVENOUS | Status: DC

## 2018-09-21 MED ORDER — HYDRALAZINE HCL 20 MG/ML IJ SOLN
INTRAMUSCULAR | Status: DC | PRN
  Administered 2018-09-21: 5 mg via INTRAVENOUS
  Administered 2018-09-21: 10 mg via INTRAVENOUS

## 2018-09-21 MED ORDER — SODIUM CHLORIDE 0.9 % IV SOLN: 250.0000 mL | INTRAVENOUS | Status: DC | PRN

## 2018-09-21 MED ORDER — HYDRALAZINE HCL 20 MG/ML IJ SOLN: 5.0000 mg | INTRAMUSCULAR | Status: DC | PRN

## 2018-09-21 MED ORDER — MIDAZOLAM HCL 2 MG/2ML IJ SOLN
INTRAMUSCULAR | Status: DC | PRN
  Administered 2018-09-21: 0.5 mg via INTRAVENOUS

## 2018-09-21 MED ORDER — MIDAZOLAM HCL 2 MG/2ML IJ SOLN
INTRAMUSCULAR | Status: AC
  Filled 2018-09-21: qty 2

## 2018-09-21 MED ORDER — ONDANSETRON HCL 4 MG/2ML IJ SOLN
4.0000 mg | Freq: Four times a day (QID) | INTRAMUSCULAR | Status: DC | PRN
  Administered 2018-09-21: 4 mg via INTRAVENOUS

## 2018-09-21 MED ORDER — FENTANYL CITRATE (PF) 100 MCG/2ML IJ SOLN: 25.0000 ug | INTRAMUSCULAR | Status: DC | PRN

## 2018-09-21 MED ORDER — HYDRALAZINE HCL 20 MG/ML IJ SOLN
INTRAMUSCULAR | Status: AC
  Filled 2018-09-21: qty 1

## 2018-09-21 MED ORDER — SODIUM CHLORIDE 0.9 % WEIGHT BASED INFUSION: 1.0000 mL/kg/h | INTRAVENOUS | Status: DC

## 2018-09-21 SURGICAL SUPPLY — 10 items
CATH OMNI FLUSH 5F 65CM (CATHETERS) ×1 IMPLANT
KIT MICROPUNCTURE NIT STIFF (SHEATH) ×1 IMPLANT
KIT PV (KITS) ×2 IMPLANT
SHEATH PINNACLE 5F 10CM (SHEATH) ×1 IMPLANT
SHEATH PROBE COVER 6X72 (BAG) ×1 IMPLANT
SYR MEDRAD MARK V 150ML (SYRINGE) ×2 IMPLANT
TRANSDUCER W/STOPCOCK (MISCELLANEOUS) ×2 IMPLANT
TRAY PV CATH (CUSTOM PROCEDURE TRAY) ×2 IMPLANT
WIRE AMPLATZ SS-J .035X180CM (WIRE) ×1 IMPLANT
WIRE BENTSON .035X145CM (WIRE) ×1 IMPLANT

## 2018-09-21 NOTE — Interval H&P Note (Signed)
History and Physical Interval Note:  09/21/2018 7:41 AM  [name]  has presented today for surgery, with the diagnosis of pad  The various methods of treatment have been discussed with the patient and family. After consideration of risks, benefits and other options for treatment, the patient has consented to  Procedure(s): LOWER EXTREMITY ANGIOGRAPHY (N/A) as a surgical intervention .  The patient's history has been reviewed, patient examined, no change in status, stable for surgery.  I have reviewed the patient's chart and labs.  Questions were answered to the patient's satisfaction.     [name]

## 2018-09-21 NOTE — Op Note (Signed)
    Patient name: [name] MRN: [id number] DOB: 11-03-1928 Sex: female  09/21/2018 Pre-operative Diagnosis: Bypass graft stenosis Post-operative diagnosis:  Same Surgeon:  [name] Procedure Performed:  1.  Ultrasound-guided access, right femoral artery  2.  Abdominal aortogram  3.  Second-order catheterization  4.  Left lower extremity runoff  5.  Conscious sedation of (19 minutes)    Indications: Patient is undergone multiple procedures for revascularization of the left leg.  Her most recent ultrasound showed a high-grade stenosis of the proximal anastomosis of her femoral anterior tibial bypass graft.  She comes in today for further evaluation.  Procedure:  The patient was identified in the holding area and taken to room 8.  The patient was then placed supine on the table and prepped and draped in the usual sterile fashion.  A time out was called.  Conscious sedation was administered with use of IV fentanyl and Versed under continuous physician and nurse monitoring.  Heart rate, blood pressure, and oxygen titration will continue to monitor.  Ultrasound was used to evaluate the right common femoral artery.  It was patent .  A digital ultrasound image was acquired.  A micropuncture needle was used to access the right common femoral artery under ultrasound guidance.  An 018 wire was advanced without resistance and a micropuncture sheath was placed.  The 018 wire was removed and a benson wire was placed.  The micropuncture sheath was exchanged for a 5 french sheath.  An omniflush catheter was advanced over the wire to the level of L-1.  An abdominal angiogram was obtained.  Next, using the omniflush catheter and a benson wire, the aortic bifurcation was crossed and the catheter was placed into theleft external iliac artery and left runoff was obtained.   Findings:   Aortogram: No significant renal artery stenosis.  Aneurysmal changes noted to the infrarenal abdominal aorta which is  heavily calcified.  The iliac arteries are patent without hemodynamically significant stenosis but heavily calcified.  Right Lower Extremity: Not evaluated  Left Lower Extremity: Left common femoral from the femoral artery are widely patent.  There is a bypass graft originating for the profundofemoral artery going down to the anterior tibial artery.  No evidence of anastomotic stenosis is identified.  The anterior tibial artery is a dominant vessel across the ankle.  Intervention: None  Impression:  #1  Widely patent left femoral anterior tibial bypass graft   V. [name], M.D. [facility] [place] Office: [phone] Pager:  [phone]

## 2018-09-21 NOTE — Discharge Instructions (Signed)
Femoral Site Care °Refer to this sheet in the next few weeks. These instructions provide you with information about caring for yourself after your procedure. Your health care provider may also give you more specific instructions. Your treatment has been planned according to current medical practices, but problems sometimes occur. Call your health care provider if you have any problems or questions after your procedure. °What can I expect after the procedure? °After your procedure, it is typical to have the following: °· Bruising at the site that usually fades within 1-2 weeks. °· Blood collecting in the tissue (hematoma) that may be painful to the touch. It should usually decrease in size and tenderness within 1-2 weeks. ° °Follow these instructions at home: °· Take medicines only as directed by your health care provider. °· You may shower 24-48 hours after the procedure or as directed by your health care provider. Remove the bandage (dressing) and gently wash the site with plain soap and water. Pat the area dry with a clean towel. Do not rub the site, because this may cause bleeding. °· Do not take baths, swim, or use a hot tub until your health care provider approves. °· Check your insertion site every day for redness, swelling, or drainage. °· Do not apply powder or lotion to the site. °· Limit use of stairs to twice a day for the first 2-3 days or as directed by your health care provider. °· Do not squat for the first 2-3 days or as directed by your health care provider. °· Do not lift over 10 lb (4.5 kg) for 5 days after your procedure or as directed by your health care provider. °· Ask your health care provider when it is okay to: °? Return to work or school. °? Resume usual physical activities or sports. °? Resume sexual activity. °· Do not drive home if you are discharged the same day as the procedure. Have someone else drive you. °· You may drive 24 hours after the procedure unless otherwise instructed by  your health care provider. °· Do not operate machinery or power tools for 24 hours after the procedure or as directed by your health care provider. °· If your procedure was done as an outpatient procedure, which means that you went home the same day as your procedure, a responsible adult should be with you for the first 24 hours after you arrive home. °· Keep all follow-up visits as directed by your health care provider. This is important. °Contact a health care provider if: °· You have a fever. °· You have chills. °· You have increased bleeding from the site. Hold pressure on the site. °Get help right away if: °· You have unusual pain at the site. °· You have redness, warmth, or swelling at the site. °· You have drainage (other than a small amount of blood on the dressing) from the site. °· The site is bleeding, and the bleeding does not stop after 30 minutes of holding steady pressure on the site. °· Your leg or foot becomes pale, cool, tingly, or numb. °This information is not intended to replace advice given to you by your health care provider. Make sure you discuss any questions you have with your health care provider. °Document Released: 07/07/2014 Document Revised: 04/10/2016 Document Reviewed: 05/23/2014 °Elsevier Interactive Patient Education © 2018 [employer] °Moderate Conscious Sedation, Adult, Care After °These instructions provide you with information about caring for yourself after your procedure. Your health care provider may also give you more   specific instructions. Your treatment has been planned according to current medical practices, but problems sometimes occur. Call your health care provider if you have any problems or questions after your procedure. °What can I expect after the procedure? °After your procedure, it is common: °· To feel sleepy for several hours. °· To feel clumsy and have poor balance for several hours. °· To have poor judgment for several hours. °· To vomit if you eat too  soon. ° °Follow these instructions at home: °For at least 24 hours after the procedure: ° °· Do not: °? Participate in activities where you could fall or become injured. °? Drive. °? Use heavy machinery. °? Drink alcohol. °? Take sleeping pills or medicines that cause drowsiness. °? Make important decisions or sign legal documents. °? Take care of children on your own. °· Rest. °Eating and drinking °· Follow the diet recommended by your health care provider. °· If you vomit: °? Drink water, juice, or soup when you can drink without vomiting. °? Make sure you have little or no nausea before eating solid foods. °General instructions °· Have a responsible adult stay with you until you are awake and alert. °· Take over-the-counter and prescription medicines only as told by your health care provider. °· If you smoke, do not smoke without supervision. °· Keep all follow-up visits as told by your health care provider. This is important. °Contact a health care provider if: °· You keep feeling nauseous or you keep vomiting. °· You feel light-headed. °· You develop a rash. °· You have a fever. °Get help right away if: °· You have trouble breathing. °This information is not intended to replace advice given to you by your health care provider. Make sure you discuss any questions you have with your health care provider. °Document Released: 08/24/2013 Document Revised: 04/07/2016 Document Reviewed: 02/23/2016 °Elsevier Interactive Patient Education © 2018 [employer] Inc[employer] ° °

## 2018-09-21 NOTE — Progress Notes (Signed)
Dr Trula Slade notified of client c/o nausea and that nausea medication given and helped; notified him of client with shaking and states not cold and c/o unable to void and order noted

## 2018-09-21 NOTE — Progress Notes (Signed)
Site area: rt groin fa sheath Site Prior to Removal:  Level 0 Pressure Applied For:  20 minutes Manual:   yes Patient Status During Pull:  stable Post Pull Site:  Level  0 Post Pull Instructions Given:  yes Post Pull Pulses Present:  rt BKA, left dp dopplered Dressing Applied:  Gauze and tegaderm Bedrest begins @ 6773 Comments:

## 2018-09-21 NOTE — Progress Notes (Signed)
C/O nausea and med given

## 2018-09-21 NOTE — Progress Notes (Signed)
In and out cath done by [facility] and 400cc urine out

## 2018-09-22 ENCOUNTER — Telehealth: Payer: Self-pay | Admitting: Surgery

## 2018-09-22 ENCOUNTER — Encounter (HOSPITAL_COMMUNITY): Payer: Self-pay | Admitting: Surgery

## 2018-09-22 NOTE — Telephone Encounter (Signed)
sch appt phone NA mld ltr 03/21/2019 11am ABI 12pm LE art 1215pm f/u MD

## 2018-09-22 NOTE — Telephone Encounter (Signed)
-----   Message from Mena Goes, RN sent at 09/21/2018  8:59 AM EST ----- Regarding: 6 months with labs   ----- Message ----- From: Serafina Mitchell, MD Sent: 09/21/2018   8:10 AM EST To: [facility] Charge Pool  09-21-2018:  Surgeon:  [name] Procedure Performed:  1.  Ultrasound-guided access, right femoral artery  2.  Abdominal aortogram  3.  Second-order catheterization  4.  Left lower extremity runoff  5.  Conscious sedation of (19 minutes)   F/u 6 months with abi and L LE duplex

## 2018-10-04 DIAGNOSIS — F064 Anxiety disorder due to known physiological condition: Secondary | ICD-10-CM | POA: Diagnosis not present

## 2018-10-04 DIAGNOSIS — R609 Edema, unspecified: Secondary | ICD-10-CM | POA: Diagnosis not present

## 2018-10-04 DIAGNOSIS — I10 Essential (primary) hypertension: Secondary | ICD-10-CM | POA: Diagnosis not present

## 2018-10-04 DIAGNOSIS — I739 Peripheral vascular disease, unspecified: Secondary | ICD-10-CM | POA: Diagnosis not present

## 2018-10-04 DIAGNOSIS — R0602 Shortness of breath: Secondary | ICD-10-CM | POA: Diagnosis not present

## 2018-10-04 DIAGNOSIS — R791 Abnormal coagulation profile: Secondary | ICD-10-CM | POA: Diagnosis not present

## 2018-10-04 DIAGNOSIS — Z23 Encounter for immunization: Secondary | ICD-10-CM | POA: Diagnosis not present

## 2018-10-04 DIAGNOSIS — D81818 Other biotin-dependent carboxylase deficiency: Secondary | ICD-10-CM | POA: Diagnosis not present

## 2018-10-04 DIAGNOSIS — E7800 Pure hypercholesterolemia, unspecified: Secondary | ICD-10-CM | POA: Diagnosis not present

## 2019-01-05 DIAGNOSIS — H35341 Macular cyst, hole, or pseudohole, right eye: Secondary | ICD-10-CM | POA: Diagnosis not present

## 2019-01-05 DIAGNOSIS — H5315 Visual distortions of shape and size: Secondary | ICD-10-CM | POA: Diagnosis not present

## 2019-01-05 DIAGNOSIS — H3500 Unspecified background retinopathy: Secondary | ICD-10-CM | POA: Diagnosis not present

## 2019-01-05 DIAGNOSIS — H52202 Unspecified astigmatism, left eye: Secondary | ICD-10-CM | POA: Diagnosis not present

## 2019-02-28 DIAGNOSIS — D81818 Other biotin-dependent carboxylase deficiency: Secondary | ICD-10-CM | POA: Diagnosis not present

## 2019-02-28 DIAGNOSIS — E7800 Pure hypercholesterolemia, unspecified: Secondary | ICD-10-CM | POA: Diagnosis not present

## 2019-02-28 DIAGNOSIS — F064 Anxiety disorder due to known physiological condition: Secondary | ICD-10-CM | POA: Diagnosis not present

## 2019-02-28 DIAGNOSIS — I739 Peripheral vascular disease, unspecified: Secondary | ICD-10-CM | POA: Diagnosis not present

## 2019-02-28 DIAGNOSIS — I10 Essential (primary) hypertension: Secondary | ICD-10-CM | POA: Diagnosis not present

## 2019-02-28 DIAGNOSIS — R609 Edema, unspecified: Secondary | ICD-10-CM | POA: Diagnosis not present

## 2019-03-07 ENCOUNTER — Other Ambulatory Visit: Payer: Self-pay

## 2019-03-07 DIAGNOSIS — I739 Peripheral vascular disease, unspecified: Secondary | ICD-10-CM

## 2019-03-07 NOTE — Progress Notes (Signed)
a 

## 2019-03-15 ENCOUNTER — Ambulatory Visit (INDEPENDENT_AMBULATORY_CARE_PROVIDER_SITE_OTHER)
Admission: RE | Admit: 2019-03-15 | Discharge: 2019-03-15 | Disposition: A | Discharge status: Home / Self Care | Payer: Medicare Other | Source: Ambulatory Visit | Attending: Surgery | Admitting: Surgery

## 2019-03-15 ENCOUNTER — Ambulatory Visit (HOSPITAL_COMMUNITY)
Admission: RE | Admit: 2019-03-15 | Discharge: 2019-03-15 | Disposition: A | Discharge status: Home / Self Care | Payer: Medicare Other | Source: Ambulatory Visit | Attending: Surgery | Admitting: Surgery

## 2019-03-15 ENCOUNTER — Other Ambulatory Visit: Payer: Self-pay

## 2019-03-15 DIAGNOSIS — I739 Peripheral vascular disease, unspecified: Secondary | ICD-10-CM

## 2019-03-21 ENCOUNTER — Other Ambulatory Visit: Payer: Self-pay

## 2019-03-21 ENCOUNTER — Telehealth (INDEPENDENT_AMBULATORY_CARE_PROVIDER_SITE_OTHER): Payer: Medicare Other | Admitting: Surgery

## 2019-03-21 ENCOUNTER — Encounter: Payer: Self-pay | Admitting: Surgery

## 2019-03-21 ENCOUNTER — Encounter (HOSPITAL_COMMUNITY): Payer: Medicare Other

## 2019-03-21 ENCOUNTER — Telehealth: Payer: Self-pay | Admitting: Surgery

## 2019-03-21 VITALS — BP 140/69 | HR 85 | Resp 14 | Ht 60.0 in

## 2019-03-21 DIAGNOSIS — I739 Peripheral vascular disease, unspecified: Secondary | ICD-10-CM | POA: Diagnosis not present

## 2019-03-21 NOTE — Progress Notes (Signed)
Virtual Visit via Video Note   This visit type was conducted due to national recommendations for restrictions regarding the COVID-19 Pandemic (e.g. social distancing) in an effort to limit this patient's exposure and mitigate transmission in our community.  Due to her co-morbid illnesses, this patient is at least at moderate risk for complications without adequate follow up.  This format is felt to be most appropriate for this patient at this time.  All issues noted in this document were discussed and addressed.  A limited physical exam was performed with this format.  Please refer to the patient's chart for her consent to telehealth for [name].   Evaluation Performed:  Follow up  Date:  03/21/2019   ID:  [name], DOB [birth year], MRN [id number]  Patient Location: Home Provider Location: Office  PCP:  Josetta Huddle, MD    Chief Complaint:  Leg pain  History of Present Illness:    [name] is a 85 y.o. (1928-03-25) female who who returns to clinic to go over left lower extremity arterial duplex and ABI.  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  Bypass of LLE was then found to be occluded and she most recently she underwent left profunda to anterior tibial bypass with PTFE in March 2018.  Since last office visit she has come to the agreement with her therapy teams that she will not be able to have her walk with a prosthetic without requiring assistance insist that time has been wheelchair-bound.  She denies any new wounds or sores on her left foot or right BKA stump.  She has phantom pain and pain in her left leg for which she takes Neurontin as well as occasional narcotic pain medication.  She is on her Plavix 75 mg daily.  She denies tobacco  use.  In November 2019 she underwent arteriogram because she had elevated velocities at her proximal anastomosis greater than 500cm/sec.  Angiography did not reveal any significant stenosis.  The patient is back today for follow-up.  She has had no interval change.  The patient's PMH, PSH, SH, and FamHx were reviewed on 03/01/18 are unchanged from prior visit.  The patient does not have symptoms concerning for COVID-19 infection (fever, chills, cough, or new shortness of breath).    Past Medical History:  Diagnosis Date  . Anemia   . Anginal pain ([place])   . Arthritis    "qwhere" (09/05/2016)  . Atrial fibrillation ([place]) 09/2016  . Chronic lower back pain   . Complication of anesthesia    "takes a long time for it to wear off; I can hallucinate if I take too much" (09/05/2016)  . DVT (deep venous thrombosis) ([place]) 10/2009  . Fall from steps 08/31/2013   Fx. pelvis, Left Hip, Left Elbow  . Fibromyalgia   . GERD (gastroesophageal reflux disease)    09/22/16- "no too much anymore"  . GI bleed 10/24/2016  . High cholesterol   . History of blood transfusion   . History of hiatal hernia   . Hypertension   . Macular degeneration, wet ([place])    "started in right eye; now legally blind in that eye; now started in left eye but pretty much in control" (09/05/2016)  . Osteoporosis   . Peripheral vascular disease ([place])  nonviable tissue Right foot  . PONV (postoperative nausea and vomiting)   . Squamous cell carcinoma of skin of right calf Aug. 2015  . Stroke ([place])    TIA's no residual  . TIA (transient ischemic attack)    "several at once; none in a long time" (09/05/2016)   Past Surgical History:  Procedure Laterality Date  . ABDOMINAL AORTAGRAM N/A 12/26/2014   Procedure: ABDOMINAL Maxcine Ham;  Surgeon: Serafina Mitchell, MD;  Location: [name] CATH [facility];  Service: Cardiovascular;  Laterality: N/A;  . AMPUTATION Right 12/23/2016   Procedure: AMPUTATION BELOW KNEE;  Surgeon: Serafina Mitchell, MD;   Location: [place];  Service: Vascular;  Laterality: Right;  . AORTOGRAM N/A 09/26/2016   Procedure: AORTOGRAM;  Surgeon: Waynetta Sandy, MD;  Location: [place];  Service: Vascular;  Laterality: N/A;  . CARPAL TUNNEL RELEASE Right   . CATARACT EXTRACTION W/ INTRAOCULAR LENS  IMPLANT, BILATERAL Bilateral   . COLONOSCOPY    . DILATION AND CURETTAGE OF UTERUS    . DRESSING CHANGE UNDER ANESTHESIA Right 01/16/2017   Procedure: DRESSING CHANGE RIGHT BELOW KNEE AMPUTATION;  Surgeon: Serafina Mitchell, MD;  Location: [place];  Service: Vascular;  Laterality: Right;  . EYE SURGERY Right    "macular OR"  . FEMORAL ARTERY STENT  12-11-10   Left SFA  . FEMORAL-POPLITEAL BYPASS GRAFT Left 09/24/2016   Procedure: REDO FEMORAL TO POPLITEAL ARTERY BYPASS GRAFT USING 6MM PROPATEN RINGED GORTEX GRAFT;  Surgeon: Serafina Mitchell, MD;  Location: [place];  Service: Vascular;  Laterality: Left;  . FEMORAL-TIBIAL BYPASS GRAFT Left 01/16/2017   Procedure: REDO BYPASS GRAFT FEMORAL-TIBIAL ARTERY WITH GORTEX GRAFT;  Surgeon: Serafina Mitchell, MD;  Location: [place];  Service: Vascular;  Laterality: Left;  AND LOWER LEG   . I&D EXTREMITY Right 12/17/2016   Procedure: IRRIGATION AND DEBRIDEMENT RIGHT FOOT;  Surgeon: Serafina Mitchell, MD;  Location: [place];  Service: Vascular;  Laterality: Right;  . INCISION AND DRAINAGE OF WOUND Left 10/25/2009   leg/notes 11/13/2009  . INSERTION OF ILIAC STENT Left 12/26/2014   Procedure: INSERTION OF ILIAC STENT;  Surgeon: Serafina Mitchell, MD;  Location: [name] CATH LAB;  Service: Cardiovascular;  Laterality: Left;  . INSERTION OF ILIAC STENT Left 09/26/2016   Procedure: SUB INTIMAL INSERTION OF SUPERFICIAL FEMORAL ARTERY AND BELOW KNEE BYPASS GRAFT;  Surgeon: Waynetta Sandy, MD;  Location: [place];  Service: Vascular;  Laterality: Left;  . JOINT REPLACEMENT     knee  . JOINT REPLACEMENT Left Oct. 17, 2014   Elbow ( pt fell 08-31-13 )  . LOWER EXTREMITY ANGIOGRAPHY N/A 09/21/2018    Procedure: LOWER EXTREMITY ANGIOGRAPHY;  Surgeon: Serafina Mitchell, MD;  Location: [place] CV LAB;  Service: Cardiovascular;  Laterality: N/A;  . ORIF SHOULDER FRACTURE Right    "it was crushed"  . PERIPHERAL VASCULAR CATHETERIZATION N/A 10/30/2015   Procedure: Abdominal Aortogram w/Lower Extremity;  Surgeon: Serafina Mitchell, MD;  Location: [place] CV LAB;  Service: Cardiovascular;  Laterality: N/A;  . PERIPHERAL VASCULAR CATHETERIZATION  10/30/2015   Procedure: Peripheral Vascular Intervention;  Surgeon: Serafina Mitchell, MD;  Location: [place] CV LAB;  Service: Cardiovascular;;  . PERIPHERAL VASCULAR CATHETERIZATION N/A 04/01/2016   Procedure: Abdominal Aortogram w/Lower Extremity;  Surgeon: Serafina Mitchell, MD;  Location: [place] CV LAB;  Service: Cardiovascular;  Laterality: N/A;  . PERIPHERAL VASCULAR CATHETERIZATION Left 04/01/2016   Procedure: Peripheral Vascular Atherectomy;  Surgeon: Serafina Mitchell,  MD;  Location: [place] CV LAB;  Service: Cardiovascular;  Laterality: Left;  Superficial femoral artery.  Marland Kitchen PERIPHERAL VASCULAR CATHETERIZATION Right 09/05/2016   "stent"  . PERIPHERAL VASCULAR CATHETERIZATION N/A 09/05/2016   Procedure: Abdominal Aortogram w/Lower Extremity;  Surgeon: Serafina Mitchell, MD;  Location: [place] CV LAB;  Service: Cardiovascular;  Laterality: N/A;  . PERIPHERAL VASCULAR CATHETERIZATION Right 09/05/2016   Procedure: Peripheral Vascular Intervention;  Surgeon: Serafina Mitchell, MD;  Location: [place] CV LAB;  Service: Cardiovascular;  Laterality: Right;  Superficial Femoral  . PERIPHERAL VASCULAR CATHETERIZATION Left 09/09/2016   Procedure: Lower Extremity Angiography;  Surgeon: Serafina Mitchell, MD;  Location: [place] CV [facility];  Service: Cardiovascular;  Laterality: Left;  . PR VEIN BYPASS GRAFT,AORTO-FEM-POP  09-13-09   Left Fem-pop  . THROMBECTOMY FEMORAL ARTERY Right 09/26/2016   Procedure: Thromboembolectomy Right Lower Extremity,  Right Femoral Artery Endarterectomy with Patch Angioplasty; Right Lower Extremity Angiogram ;  Surgeon: Waynetta Sandy, MD;  Location: [place];  Service: Vascular;  Laterality: Right;  . TOTAL ELBOW ARTHROPLASTY Left 09/03/2013   Procedure: LEFT TOTAL ELBOW ARTHROPLASTY;  Surgeon: Roseanne Kaufman, MD;  Location: [place];  Service: Orthopedics;  Laterality: Left;  . TOTAL KNEE ARTHROPLASTY Left 06/2006  . TRANSMETATARSAL AMPUTATION Right 12/11/2016   Procedure: TRANSMETATARSAL AMPUTATION;  Surgeon: Serafina Mitchell, MD;  Location: [place];  Service: Vascular;  Laterality: Right;  . TUBAL LIGATION    . VAGINAL HYSTERECTOMY       No outpatient medications have been marked as taking for the 03/21/19 encounter (Appointment) with Serafina Mitchell, MD.     Allergies:   Motrin [ibuprofen]; Statins; Eliquis [apixaban]; Morphine and related; Oxycontin [oxycodone hcl]; Promethazine hcl; and Sulfa antibiotics   Social History   Tobacco Use  . Smoking status: Former Smoker    Types: Cigarettes    Last attempt to quit: 11/17/1946    Years since quitting: 72.3  . Smokeless tobacco: Never Used  . Tobacco comment: "never smoked much"  Substance Use Topics  . Alcohol use: No    Alcohol/week: 0.0 standard drinks  . Drug use: No     Family Hx: The patient's family history includes Alcohol abuse in her father; Deep vein thrombosis in her brother; Diabetes in her son; Heart attack in her son; Heart disease in her brother, father, and son; Hyperlipidemia in her brother, father, sister, and son; Hypertension in her brother, father, sister, son, and son.  ROS:   Please see the history of present illness.     All other systems reviewed and are negative.   Prior CV studies:   The following studies were reviewed today:  I have reviewed the following duplex studies: Left: Patent left fem-tib bypass graft with velocities of 375 cm/s in the proximal anastomosis, consistent with a 50-70% stenosis. Disease  appears to begin in the native inflow artery. ABI= 0.72  Labs/Other Tests and Data Reviewed:      Recent Labs: 07/08/2018: Platelets 273 09/21/2018: BUN 26; Creatinine, Ser 1.50; Hemoglobin 13.3; Potassium 3.8; Sodium 140   Recent Lipid Panel Lab Results  Component Value Date/Time   CHOL  02/11/2009 06:50 PM    183        ATP III CLASSIFICATION:  <200     mg/dL   Desirable  200-239  mg/dL   Borderline High  >=240    mg/dL   High          TRIG 342 (H) 02/11/2009 06:50 PM  HDL 28 (L) 02/11/2009 06:50 PM   CHOLHDL 6.5 02/11/2009 06:50 PM   [place]  02/11/2009 06:50 PM    87        Total Cholesterol/HDL:CHD Risk Coronary Heart Disease Risk Table                     Men   Women  1/2 Average Risk   3.4   3.3  Average Risk       5.0   4.4  2 X Average Risk   9.6   7.1  3 X Average Risk  23.4   11.0        Use the calculated Patient Ratio above and the CHD Risk Table to determine the patient's CHD Risk.        ATP III CLASSIFICATION (LDL):  <100     mg/dL   Optimal  100-129  mg/dL   Near or Above                    Optimal  130-159  mg/dL   Borderline  160-189  mg/dL   High  >190     mg/dL   Very High    Wt Readings from Last 3 Encounters:  09/21/18 63.5 kg  03/01/18 59.9 kg  05/15/17 49.9 kg     Objective:    Vital Signs:  There were no vitals taken for this visit.   Respiratory: Nonlabored breathing HEENT: Normal cephalic atraumatic, no scleral icterus Psych: Normal affect and mood  ASSESSMENT & PLAN:    1. PAD: By duplex, her bypass remains widely patent.  The velocities are elevated the proximal anastomosis, similar to the findings prior to her arteriogram in November 2019 which showed a widely patent bypass graft.  I would not recommend any intervention at this time.  I will plan on repeating her ultrasound study in 6 months.  She will contact me if she has any concerns or questions.  COVID-19 Education: The signs and symptoms of COVID-19 were discussed  with the patient and how to seek care for testing (follow up with PCP or arrange E-visit).  The importance of social distancing was discussed today.  Time:   Today, I have spent 12 minutes with the patient with telehealth technology discussing the above problems.     Medication Adjustments/Labs and Tests Ordered: Current medicines are reviewed at length with the patient today.  Concerns regarding medicines are outlined above.   Tests Ordered: Duplex and ABI in 6 months   Disposition:  Follow up 6 months  Signed, [name], MD  03/21/2019 11:20 AM    [facility]

## 2019-03-21 NOTE — Telephone Encounter (Signed)
° ° °  I spoke with Mrs. Crevier and her daughter, [name] on speaker about her video visit with [name] at 12:15 this afternoon.  1. I confirmed the best number to call was a cell phone number: [phone] and that it had video capability- I included this in appointment notes.  2. I confirmed consent, verbally by reading the following information:  - All virtual visits are billed to your insurance company just like a normal visit would be.   - In a virtual visit, your provider cannot perform an examination, and may limit your provider's ability to fully assess your condition.  - If your provider identifies any concerns that need to be evaluated in person, we will make arrangements to do so.   - Finally, though the technology is pretty good, we cannot assure that it will always work on either your or our end, and we may have to convert the video visit to a phone-only visit.  In either situation, we cannot ensure that we have a secure connection.   - Are you willing to proceed?" She said YES  3. I advise her to obtain all vitals that she could to give the [place] who calls her before the appointment.  Two Her daughter confirmed that she could take her blood pressure and O2 sat.  4. I told her that she would receive a text on her cell phone, inviting her to join Dr. Stephens Shire virtual waiting room and he would join her there.  5. I informed her that she would receive a phone call 15 minutes prior to her appointment time from Dr. Stephens Shire nurse to review her medications, allergies, etc.    TELEPHONE CALL NOTE  KIMYETTA FLOTT has been deemed a candidate for a follow-up tele-health visit to limit community exposure during the Covid-19 pandemic. I spoke with the patient via phone to ensure availability of phone/video source, confirm preferred email & phone number, and discuss instructions and expectations.  I reminded MALIYA MARICH to be prepared with any vital sign that could potentially be obtained  via home monitoring, at the time of her visit. I reminded XIOMARA SEVILLANO to expect a phone call prior to her visit.  Ovidio Hanger 03/21/2019 10:36 AM

## 2019-04-20 DIAGNOSIS — H43812 Vitreous degeneration, left eye: Secondary | ICD-10-CM | POA: Diagnosis not present

## 2019-04-20 DIAGNOSIS — H35341 Macular cyst, hole, or pseudohole, right eye: Secondary | ICD-10-CM | POA: Diagnosis not present

## 2019-06-21 ENCOUNTER — Encounter (HOSPITAL_COMMUNITY): Payer: Self-pay | Admitting: Emergency Medicine

## 2019-06-21 ENCOUNTER — Telehealth: Payer: Self-pay | Admitting: *Deleted

## 2019-06-21 ENCOUNTER — Emergency Department (HOSPITAL_COMMUNITY)
Admission: EM | Admit: 2019-06-21 | Discharge: 2019-06-21 | Disposition: A | Discharge status: Home / Self Care | Payer: Medicare Other | Attending: Emergency Medicine | Admitting: Emergency Medicine

## 2019-06-21 ENCOUNTER — Emergency Department (HOSPITAL_COMMUNITY): Payer: Medicare Other

## 2019-06-21 ENCOUNTER — Other Ambulatory Visit: Payer: Self-pay

## 2019-06-21 ENCOUNTER — Ambulatory Visit (HOSPITAL_BASED_OUTPATIENT_CLINIC_OR_DEPARTMENT_OTHER): Payer: Medicare Other

## 2019-06-21 DIAGNOSIS — I4891 Unspecified atrial fibrillation: Secondary | ICD-10-CM | POA: Insufficient documentation

## 2019-06-21 DIAGNOSIS — I130 Hypertensive heart and chronic kidney disease with heart failure and stage 1 through stage 4 chronic kidney disease, or unspecified chronic kidney disease: Secondary | ICD-10-CM | POA: Diagnosis not present

## 2019-06-21 DIAGNOSIS — M5431 Sciatica, right side: Secondary | ICD-10-CM | POA: Insufficient documentation

## 2019-06-21 DIAGNOSIS — Z86718 Personal history of other venous thrombosis and embolism: Secondary | ICD-10-CM | POA: Diagnosis not present

## 2019-06-21 DIAGNOSIS — I739 Peripheral vascular disease, unspecified: Secondary | ICD-10-CM | POA: Insufficient documentation

## 2019-06-21 DIAGNOSIS — M1611 Unilateral primary osteoarthritis, right hip: Secondary | ICD-10-CM | POA: Diagnosis not present

## 2019-06-21 DIAGNOSIS — I5042 Chronic combined systolic (congestive) and diastolic (congestive) heart failure: Secondary | ICD-10-CM | POA: Insufficient documentation

## 2019-06-21 DIAGNOSIS — M545 Low back pain: Secondary | ICD-10-CM | POA: Diagnosis not present

## 2019-06-21 DIAGNOSIS — Z7901 Long term (current) use of anticoagulants: Secondary | ICD-10-CM | POA: Insufficient documentation

## 2019-06-21 DIAGNOSIS — R52 Pain, unspecified: Secondary | ICD-10-CM | POA: Diagnosis not present

## 2019-06-21 DIAGNOSIS — Z89511 Acquired absence of right leg below knee: Secondary | ICD-10-CM | POA: Diagnosis not present

## 2019-06-21 DIAGNOSIS — Z79899 Other long term (current) drug therapy: Secondary | ICD-10-CM | POA: Insufficient documentation

## 2019-06-21 DIAGNOSIS — N183 Chronic kidney disease, stage 3 (moderate): Secondary | ICD-10-CM | POA: Diagnosis not present

## 2019-06-21 DIAGNOSIS — M79604 Pain in right leg: Secondary | ICD-10-CM | POA: Diagnosis present

## 2019-06-21 LAB — CBC WITH DIFFERENTIAL/PLATELET
Abs Immature Granulocytes: 0.16 10*3/uL — ABNORMAL HIGH (ref 0.00–0.07)
Basophils Absolute: 0.1 10*3/uL (ref 0.0–0.1)
Basophils Relative: 1 %
Eosinophils Absolute: 0.3 10*3/uL (ref 0.0–0.5)
Eosinophils Relative: 4 %
HCT: 40 % (ref 36.0–46.0)
Hemoglobin: 12.9 g/dL (ref 12.0–15.0)
Immature Granulocytes: 2 %
Lymphocytes Relative: 26 %
Lymphs Abs: 2.2 10*3/uL (ref 0.7–4.0)
MCH: 31.2 pg (ref 26.0–34.0)
MCHC: 32.3 g/dL (ref 30.0–36.0)
MCV: 96.9 fL (ref 80.0–100.0)
Monocytes Absolute: 0.9 10*3/uL (ref 0.1–1.0)
Monocytes Relative: 10 %
Neutro Abs: 5 10*3/uL (ref 1.7–7.7)
Neutrophils Relative %: 57 %
Platelets: 238 10*3/uL (ref 150–400)
RBC: 4.13 MIL/uL (ref 3.87–5.11)
RDW: 13 % (ref 11.5–15.5)
WBC: 8.6 10*3/uL (ref 4.0–10.5)
nRBC: 0 % (ref 0.0–0.2)

## 2019-06-21 LAB — BASIC METABOLIC PANEL
Anion gap: 11 (ref 5–15)
BUN: 20 mg/dL (ref 8–23)
CO2: 24 mmol/L (ref 22–32)
Calcium: 9.5 mg/dL (ref 8.9–10.3)
Chloride: 101 mmol/L (ref 98–111)
Creatinine, Ser: 1.15 mg/dL — ABNORMAL HIGH (ref 0.44–1.00)
GFR calc Af Amer: 49 mL/min — ABNORMAL LOW (ref >60)
GFR calc non Af Amer: 42 mL/min — ABNORMAL LOW (ref >60)
Glucose, Bld: 97 mg/dL (ref 70–99)
Potassium: 4.2 mmol/L (ref 3.5–5.1)
Sodium: 136 mmol/L (ref 135–145)

## 2019-06-21 MED ORDER — GABAPENTIN 300 MG PO CAPS
300.0000 mg | ORAL_CAPSULE | Freq: Once | ORAL | Status: AC
  Administered 2019-06-21: 300 mg via ORAL
  Filled 2019-06-21: qty 1

## 2019-06-21 NOTE — ED Triage Notes (Signed)
Onset 1 week ago developed right groin pain radiating to right stump. States history of blood clots. Below the knee amputation 2 years ago.

## 2019-06-21 NOTE — ED Provider Notes (Signed)
[facility] EMERGENCY DEPARTMENT Provider Note   CSN: [id number] Arrival date & time: 06/21/19  1051    History   Chief Complaint Chief Complaint  Patient presents with   Leg Pain    HPI [name] is a 85 y.o. female.     Pt presents to the ED today with RLE pain.  Pt said pain radiates from hip down to her R BKA stump.  She said it started about 4 days ago.  Last night was the worst.  The pt has a hx of blood clots and was worried that was what she has today.  She is on plavix, but no other blood thinners.  Pt said she does not walk and sits in a wheelchair every day.     Past Medical History:  Diagnosis Date   Anemia    Anginal pain ([place])    Arthritis    "qwhere" (09/05/2016)   Atrial fibrillation ([place]) 09/2016   Chronic lower back pain    Complication of anesthesia    "takes a long time for it to wear off; I can hallucinate if I take too much" (09/05/2016)   DVT (deep venous thrombosis) ([place]) 10/2009   Fall from steps 08/31/2013   Fx. pelvis, Left Hip, Left Elbow   Fibromyalgia    GERD (gastroesophageal reflux disease)    09/22/16- "no too much anymore"   GI bleed 10/24/2016   High cholesterol    History of blood transfusion    History of hiatal hernia    Hypertension    Macular degeneration, wet ([facility])    "started in right eye; now legally blind in that eye; now started in left eye but pretty much in control" (09/05/2016)   Osteoporosis    Peripheral vascular disease ([place])    nonviable tissue Right foot   PONV (postoperative nausea and vomiting)    Squamous cell carcinoma of skin of right calf Aug. 2015   Stroke [facility])    TIA's no residual   TIA (transient ischemic attack)    "several at once; none in a long time" (09/05/2016)    Patient Active Problem List   Diagnosis Date Noted   Plantar fasciitis of left foot 06/10/2017   Idiopathic chronic venous hypertension of both lower extremities with inflammation  05/21/2017   Foot drop, left 05/21/2017   Decubitus ulcer of sacral region, unstageable ([place])    Deep tissue injury    Hypokalemia    Hypoalbuminemia due to protein-calorie malnutrition ([place])    Debilitated 01/21/2017   Neuropathic pain    Post-operative pain    Slow transit constipation    Debility    Ischemic leg    PAF (paroxysmal atrial fibrillation) (HCC)    Anemia of chronic disease    Chronic pain syndrome    Fibromyalgia    Stage 3 chronic kidney disease (HCC)    Benign essential HTN    Abnormal urinalysis    PVD (peripheral vascular disease) ([place]) 01/13/2017   Post-op pain 01/03/2017   Phantom limb pain ([place]) 01/03/2017   S/P unilateral BKA (below knee amputation), right ([place]) 12/30/2016   Acute on chronic combined systolic and diastolic CHF (congestive heart failure) ([place]) 12/22/2016   Anemia 12/22/2016   Palliative care encounter    Lower extremity pain, right    Acute GI bleeding 10/23/2016   Paroxysmal atrial fibrillation ([place]) 10/03/2016   Atherosclerosis of autologous vein bypass graft of left lower extremity with rest pain ([place]) 09/24/2016  PAD (peripheral artery disease) ([place]) 09/05/2016   Groin pain 04/02/2015   Aftercare following surgery of the circulatory system, NEC 12/13/2013   Shingles 10/26/2013   Anxiety 10/26/2013   Acute blood loss anemia 09/05/2013   Elbow fracture, left 09/05/2013   Left acetabular fracture ([place]) 09/05/2013   Ankle fracture, left 09/05/2013   HTN (hypertension) 09/03/2013   HLD (hyperlipidemia) 09/03/2013   Peripheral vascular disease, unspecified ([place]) 05/10/2012   Chronic total occlusion of artery of the extremities ([place]) 01/19/2012    Past Surgical History:  Procedure Laterality Date   ABDOMINAL AORTAGRAM N/A 12/26/2014   Procedure: ABDOMINAL Maxcine Ham;  Surgeon: Serafina Mitchell, MD;  Location: [facility] CATH LAB;  Service: Cardiovascular;  Laterality: N/A;   AMPUTATION Right  12/23/2016   Procedure: AMPUTATION BELOW KNEE;  Surgeon: Serafina Mitchell, MD;  Location: [place];  Service: Vascular;  Laterality: Right;   AORTOGRAM N/A 09/26/2016   Procedure: AORTOGRAM;  Surgeon: Waynetta Sandy, MD;  Location: [place];  Service: Vascular;  Laterality: N/A;   CARPAL TUNNEL RELEASE Right    CATARACT EXTRACTION W/ INTRAOCULAR LENS  IMPLANT, BILATERAL Bilateral    COLONOSCOPY     DILATION AND CURETTAGE OF UTERUS     DRESSING CHANGE UNDER ANESTHESIA Right 01/16/2017   Procedure: DRESSING CHANGE RIGHT BELOW KNEE AMPUTATION;  Surgeon: Serafina Mitchell, MD;  Location: [place];  Service: Vascular;  Laterality: Right;   EYE SURGERY Right    "macular OR"   FEMORAL ARTERY STENT  12-11-10   Left SFA   FEMORAL-POPLITEAL BYPASS GRAFT Left 09/24/2016   Procedure: REDO FEMORAL TO POPLITEAL ARTERY BYPASS GRAFT USING 6MM PROPATEN RINGED GORTEX GRAFT;  Surgeon: Serafina Mitchell, MD;  Location: [facility] OR;  Service: Vascular;  Laterality: Left;   FEMORAL-TIBIAL BYPASS GRAFT Left 01/16/2017   Procedure: REDO BYPASS GRAFT FEMORAL-TIBIAL ARTERY WITH GORTEX GRAFT;  Surgeon: Serafina Mitchell, MD;  Location: [facility] OR;  Service: Vascular;  Laterality: Left;  AND LOWER LEG    I&D EXTREMITY Right 12/17/2016   Procedure: IRRIGATION AND DEBRIDEMENT RIGHT FOOT;  Surgeon: Serafina Mitchell, MD;  Location: [facility] OR;  Service: Vascular;  Laterality: Right;   INCISION AND DRAINAGE OF WOUND Left 10/25/2009   leg/notes 11/13/2009   INSERTION OF ILIAC STENT Left 12/26/2014   Procedure: INSERTION OF ILIAC STENT;  Surgeon: Serafina Mitchell, MD;  Location: [place] CATH LAB;  Service: Cardiovascular;  Laterality: Left;   INSERTION OF ILIAC STENT Left 09/26/2016   Procedure: SUB INTIMAL INSERTION OF SUPERFICIAL FEMORAL ARTERY AND BELOW KNEE BYPASS GRAFT;  Surgeon: Waynetta Sandy, MD;  Location: [place];  Service: Vascular;  Laterality: Left;   JOINT REPLACEMENT     knee   JOINT REPLACEMENT Left Oct. 17, 2014   Elbow  ( pt fell 08-31-13 )   LOWER EXTREMITY ANGIOGRAPHY N/A 09/21/2018   Procedure: LOWER EXTREMITY ANGIOGRAPHY;  Surgeon: Serafina Mitchell, MD;  Location: [place] CV LAB;  Service: Cardiovascular;  Laterality: N/A;   ORIF SHOULDER FRACTURE Right    "it was crushed"   PERIPHERAL VASCULAR CATHETERIZATION N/A 10/30/2015   Procedure: Abdominal Aortogram w/Lower Extremity;  Surgeon: Serafina Mitchell, MD;  Location: [place] CV LAB;  Service: Cardiovascular;  Laterality: N/A;   PERIPHERAL VASCULAR CATHETERIZATION  10/30/2015   Procedure: Peripheral Vascular Intervention;  Surgeon: Serafina Mitchell, MD;  Location: [place] CV LAB;  Service: Cardiovascular;;   PERIPHERAL VASCULAR CATHETERIZATION N/A 04/01/2016   Procedure: Abdominal Aortogram w/Lower Extremity;  Surgeon: Serafina Mitchell, MD;  Location: [place] CV LAB;  Service: Cardiovascular;  Laterality: N/A;   PERIPHERAL VASCULAR CATHETERIZATION Left 04/01/2016   Procedure: Peripheral Vascular Atherectomy;  Surgeon: Serafina Mitchell, MD;  Location: [place] CV LAB;  Service: Cardiovascular;  Laterality: Left;  Superficial femoral artery.   PERIPHERAL VASCULAR CATHETERIZATION Right 09/05/2016   "stent"   PERIPHERAL VASCULAR CATHETERIZATION N/A 09/05/2016   Procedure: Abdominal Aortogram w/Lower Extremity;  Surgeon: Serafina Mitchell, MD;  Location: [place] CV LAB;  Service: Cardiovascular;  Laterality: N/A;   PERIPHERAL VASCULAR CATHETERIZATION Right 09/05/2016   Procedure: Peripheral Vascular Intervention;  Surgeon: Serafina Mitchell, MD;  Location: [place] CV LAB;  Service: Cardiovascular;  Laterality: Right;  Superficial Femoral   PERIPHERAL VASCULAR CATHETERIZATION Left 09/09/2016   Procedure: Lower Extremity Angiography;  Surgeon: Serafina Mitchell, MD;  Location: [place] CV LAB;  Service: Cardiovascular;  Laterality: Left;   PR VEIN BYPASS GRAFT,AORTO-FEM-POP  09-13-09   Left Fem-pop   THROMBECTOMY FEMORAL ARTERY Right  09/26/2016   Procedure: Thromboembolectomy Right Lower Extremity, Right Femoral Artery Endarterectomy with Patch Angioplasty; Right Lower Extremity Angiogram ;  Surgeon: Waynetta Sandy, MD;  Location: [place];  Service: Vascular;  Laterality: Right;   TOTAL ELBOW ARTHROPLASTY Left 09/03/2013   Procedure: LEFT TOTAL ELBOW ARTHROPLASTY;  Surgeon: Roseanne Kaufman, MD;  Location: [place];  Service: Orthopedics;  Laterality: Left;   TOTAL KNEE ARTHROPLASTY Left 06/2006   TRANSMETATARSAL AMPUTATION Right 12/11/2016   Procedure: TRANSMETATARSAL AMPUTATION;  Surgeon: Serafina Mitchell, MD;  Location[facility] OR;  Service: Vascular;  Laterality: Right;   TUBAL LIGATION     VAGINAL HYSTERECTOMY       OB History   No obstetric history on file.      Home Medications    Prior to Admission medications   Medication Sig Start Date End Date Taking? Authorizing Provider  acetaminophen (TYLENOL) 500 MG tablet Take 1,000 mg by mouth 2 (two) times daily as needed for moderate pain or headache. Patient took this medication for her pain.    [provider]  Cholecalciferol (VITAMIN D3) 400 units tablet Take 400 Units by mouth at bedtime.     [provider]  clopidogrel (PLAVIX) 75 MG tablet Take 1 tablet (75 mg total) by mouth daily. 01/28/17   Angiulli, Lavon Paganini, PA-C  Cyanocobalamin (VITAMIN B-12) 5000 MCG TBDP Take 5,000 mcg by mouth daily.     [provider]  diazepam (VALIUM) 5 MG tablet Take 0.5 tablets (2.5 mg total) by mouth at bedtime as needed for anxiety. Patient taking differently: Take 2.5-5 mg by mouth daily as needed for anxiety.  01/28/17   Angiulli, Lavon Paganini, PA-C  furosemide (LASIX) 20 MG tablet Take 20 mg by mouth every Monday, Wednesday, and Friday.    [provider]  gabapentin (NEURONTIN) 300 MG capsule Take 600 mg by mouth 2 (two) times daily.    [provider]  loperamide (IMODIUM) 2 MG capsule Take 2 mg by mouth as needed for diarrhea or  loose stools.    [provider]  metoprolol tartrate (LOPRESSOR) 25 MG tablet Take 1 tablet (25 mg total) by mouth at bedtime. 01/28/17   Angiulli, Lavon Paganini, PA-C  Multiple Vitamins-Minerals (EYE VITAMINS PO) Take 1 tablet by mouth at bedtime.     [provider]  oxycodone (OXY-IR) 5 MG capsule Take 5-10 mg by mouth every 4 (four) hours as needed for pain.  [provider]  potassium chloride (K-DUR,KLOR-CON) 10 MEQ tablet Take 10 mEq by mouth daily.    [provider]  valsartan-hydrochlorothiazide (DIOVAN-HCT) 160-12.5 MG tablet Take 0.5 tablets by mouth daily. 01/28/17   Angiulli, Lavon Paganini, PA-C    Family History Family History  Problem Relation Age of Onset   Heart disease Father        Heart Disease before age 22   Hyperlipidemia Father    Hypertension Father    Alcohol abuse Father    Heart disease Brother    Hyperlipidemia Brother    Hypertension Brother    Deep vein thrombosis Brother    Heart disease Son        Heart Disease before age 2   Hyperlipidemia Son    Hypertension Son    Heart attack Son    Diabetes Son    Hypertension Son    Hyperlipidemia Sister    Hypertension Sister     Social History Social History   Tobacco Use   Smoking status: Former Smoker    Types: Cigarettes    Quit date: 11/17/1946    Years since quitting: 72.6   Smokeless tobacco: Never Used   Tobacco comment: "never smoked much"  Substance Use Topics   Alcohol use: No    Alcohol/week: 0.0 standard drinks   Drug use: No     Allergies   Motrin [ibuprofen], Statins, Eliquis [apixaban], Morphine and related, Oxycontin [oxycodone hcl], Promethazine hcl, and Sulfa antibiotics   Review of Systems Review of Systems  Musculoskeletal:       Right leg pain  All other systems reviewed and are negative.    Physical Exam Updated Vital Signs BP (!) 166/63    Pulse 67    Temp 98.8 F (37.1 C) (Oral)    Resp 19    Ht 5\' 2"  (1.575  m)    Wt 60 kg    SpO2 99%    BMI 24.19 kg/m   Physical Exam Vitals signs and nursing note reviewed.  Constitutional:      Appearance: Normal appearance.  HENT:     Head: Normocephalic and atraumatic.     Right Ear: External ear normal.     Left Ear: External ear normal.     Nose: Nose normal.     Mouth/Throat:     Mouth: Mucous membranes are moist.     Pharynx: Oropharynx is clear.  Eyes:     Extraocular Movements: Extraocular movements intact.     Conjunctiva/sclera: Conjunctivae normal.     Pupils: Pupils are equal, round, and reactive to light.  Neck:     Musculoskeletal: Normal range of motion and neck supple.  Cardiovascular:     Rate and Rhythm: Normal rate and regular rhythm.     Pulses: Normal pulses.     Heart sounds: Normal heart sounds.  Pulmonary:     Effort: Pulmonary effort is normal.     Breath sounds: Normal breath sounds.  Abdominal:     General: Abdomen is flat. Bowel sounds are normal.     Palpations: Abdomen is soft.  Musculoskeletal:     Comments: R BKA  Skin:    General: Skin is warm.     Capillary Refill: Capillary refill takes less than 2 seconds.     Findings: No rash.  Neurological:     General: No focal deficit present.     Mental Status: She is alert and oriented to person, place, and time.  Psychiatric:  Mood and Affect: Mood normal.        Behavior: Behavior normal.        Thought Content: Thought content normal.        Judgment: Judgment normal.      ED Treatments / Results  Labs (all labs ordered are listed, but only abnormal results are displayed) Labs Reviewed  BASIC METABOLIC PANEL - Abnormal; Notable for the following components:      Result Value   Creatinine, Ser 1.15 (*)    GFR calc non Af Amer 42 (*)    GFR calc Af Amer 49 (*)    All other components within normal limits  CBC WITH DIFFERENTIAL/PLATELET - Abnormal; Notable for the following components:   Abs Immature Granulocytes 0.16 (*)    All other  components within normal limits    EKG None  Radiology Dg Lumbar Spine Complete  Result Date: 06/21/2019 CLINICAL DATA:  Lower back pain and right groin pain for 1 week, no known injury. EXAM: LUMBAR SPINE - COMPLETE 4+ VIEW COMPARISON:  None. FINDINGS: Four non-rib-bearing lumbar type vertebral bodies and a partially sacralized L5 vertebral body are present. There are minimal discogenic and facet degenerative changes of the lumbar spine. Findings are most pronounced at the lumbosacral junction. No vertebral body fracture, compression deformity, spondylolysis or spondylolisthesis is evident. There is extensive atherosclerotic calcification of the abdominal aorta as well as multiple surgical clips endovascular stent grafts suggesting prior vascular intervention. Small focal ectatic segment of the aorta is noted. IMPRESSION: Minimal degenerative changes of the lumbar spine. No acute osseous abnormality. Extensive aortic atherosclerosis including a small focal ectatic segment of the lower aorta. Consider further evaluation with abdominal aortic ultrasound given absence of comparison to assess for stability. Electronically Signed   By: Lovena Le M.D.   On: 06/21/2019 15:57   Dg Hip Unilat W Or Wo Pelvis 2-3 Views Right  Result Date: 06/21/2019 CLINICAL DATA:  Low back pain and right hip pain for 1 week, no known injury. EXAM: DG HIP (WITH OR WITHOUT PELVIS) 2-3V RIGHT COMPARISON:  None. FINDINGS: There is no evidence of hip fracture or dislocation. Minimal right hip arthrosis. Additional degenerative changes of the pubic symphysis. Bowel gas pattern is unremarkable. Vascular stent graft is noted in the medial thigh. Few surgical clips noted in the pelvis. Remaining soft tissues are unremarkable. IMPRESSION: Minimal right hip arthrosis. No acute osseous abnormality. Electronically Signed   By: Lovena Le M.D.   On: 06/21/2019 15:58   Vas [place] Lower Extremity Venous (dvt) (only [facility])  Result Date:  06/21/2019  Lower Venous Study Indications: Pain.  Limitations: Below knee amputation. Comparison Study: No prior study. Performing Technologist: Maudry Mayhew MHA, RDMS, RVT, RDCS  Examination Guidelines: A complete evaluation includes B-mode imaging, spectral Doppler, color Doppler, and power Doppler as needed of all accessible portions of each vessel. Bilateral testing is considered an integral part of a complete examination. Limited examinations for reoccurring indications may be performed as noted.  +---------+---------------+---------+-----------+----------+------------------+  RIGHT     Compressibility Phasicity Spontaneity Properties Summary             +---------+---------------+---------+-----------+----------+------------------+  CFV       Full            Yes       Yes                                        +---------+---------------+---------+-----------+----------+------------------+  SFJ       Full                                                                 +---------+---------------+---------+-----------+----------+------------------+  FV Prox   Full                                                                 +---------+---------------+---------+-----------+----------+------------------+  FV Mid    Full                                             intimal thickening  +---------+---------------+---------+-----------+----------+------------------+  FV Distal Full                                                                 +---------+---------------+---------+-----------+----------+------------------+  PFV       Full                                                                 +---------+---------------+---------+-----------+----------+------------------+  POP       Full            Yes       Yes                                        +---------+---------------+---------+-----------+----------+------------------+  PTV                                                        Not visualized       +---------+---------------+---------+-----------+----------+------------------+  PERO                                                       Not visualized      +---------+---------------+---------+-----------+----------+------------------+   +----+---------------+---------+-----------+----------+-------+  LEFT Compressibility Phasicity Spontaneity Properties Summary  +----+---------------+---------+-----------+----------+-------+  CFV  Full            Yes       Yes                             +----+---------------+---------+-----------+----------+-------+  Summary: Right: There is no evidence of deep vein thrombosis in the lower extremity. However, portions of this examination were limited- see technologist comments above. No cystic structure found in the popliteal fossa. Left: No evidence of common femoral vein obstruction.  Patient has history of PVD with right lower extremity below knee amputation. Patient requested assurance that right lower extremity arterial flow was intact; brief evaluation of the right popliteal artery was performed and was patent with monophasic flow. *See table(s) above for measurements and observations.    Preliminary     Procedures Procedures (including critical care time)  Medications Ordered in ED Medications  gabapentin (NEURONTIN) capsule 300 mg (has no administration in time range)     Initial Impression / Assessment and Plan / ED Course  I have reviewed the triage vital signs and the nursing notes.  Pertinent labs & imaging results that were available during my care of the patient were reviewed by me and considered in my medical decision making (see chart for details).       Pt's sx c/w sciatic pain.  Labs, Xrays, [place] reviewed.  Pt has known aortic arterial disease.  Pt has valium, neurontin, and oxycodone at home.  She knows to return if worse.  Final Clinical Impressions(s) / ED Diagnoses   Final diagnoses:  Sciatica of right side    ED Discharge  Orders    None       Isla Pence, MD 06/21/19 [phone]

## 2019-06-21 NOTE — Progress Notes (Signed)
Right lower extremity venous duplex completed. Refer to "CV Proc" under chart review to view preliminary results.  06/21/2019 2:37 PM Maudry Mayhew, MHA, RVT, RDCS, RDMS

## 2019-06-21 NOTE — Telephone Encounter (Signed)
Call from patient's daughter [name]). Patient has had a 2-3 days of "severe pain and coldness to extremity" Little to no relief with Oxydcodone. She is hoping to see a doctor and be admitted to hospital. Due to patient's symptoms instructed to go to [facility] ER for evaluation and treatment.

## 2019-06-24 DIAGNOSIS — B029 Zoster without complications: Secondary | ICD-10-CM | POA: Diagnosis not present

## 2019-07-15 DIAGNOSIS — M810 Age-related osteoporosis without current pathological fracture: Secondary | ICD-10-CM | POA: Diagnosis not present

## 2019-07-15 DIAGNOSIS — E782 Mixed hyperlipidemia: Secondary | ICD-10-CM | POA: Diagnosis not present

## 2019-07-15 DIAGNOSIS — I10 Essential (primary) hypertension: Secondary | ICD-10-CM | POA: Diagnosis not present

## 2019-07-15 DIAGNOSIS — M169 Osteoarthritis of hip, unspecified: Secondary | ICD-10-CM | POA: Diagnosis not present

## 2019-07-15 DIAGNOSIS — E7800 Pure hypercholesterolemia, unspecified: Secondary | ICD-10-CM | POA: Diagnosis not present

## 2019-07-15 DIAGNOSIS — M1990 Unspecified osteoarthritis, unspecified site: Secondary | ICD-10-CM | POA: Diagnosis not present

## 2019-08-31 DIAGNOSIS — Z1389 Encounter for screening for other disorder: Secondary | ICD-10-CM | POA: Diagnosis not present

## 2019-08-31 DIAGNOSIS — G4700 Insomnia, unspecified: Secondary | ICD-10-CM | POA: Diagnosis not present

## 2019-08-31 DIAGNOSIS — Z23 Encounter for immunization: Secondary | ICD-10-CM | POA: Diagnosis not present

## 2019-08-31 DIAGNOSIS — Z89511 Acquired absence of right leg below knee: Secondary | ICD-10-CM | POA: Diagnosis not present

## 2019-08-31 DIAGNOSIS — I10 Essential (primary) hypertension: Secondary | ICD-10-CM | POA: Diagnosis not present

## 2019-08-31 DIAGNOSIS — K589 Irritable bowel syndrome without diarrhea: Secondary | ICD-10-CM | POA: Diagnosis not present

## 2019-08-31 DIAGNOSIS — Z0001 Encounter for general adult medical examination with abnormal findings: Secondary | ICD-10-CM | POA: Diagnosis not present

## 2019-08-31 DIAGNOSIS — E559 Vitamin D deficiency, unspecified: Secondary | ICD-10-CM | POA: Diagnosis not present

## 2019-08-31 DIAGNOSIS — I739 Peripheral vascular disease, unspecified: Secondary | ICD-10-CM | POA: Diagnosis not present

## 2019-08-31 DIAGNOSIS — D81818 Other biotin-dependent carboxylase deficiency: Secondary | ICD-10-CM | POA: Diagnosis not present

## 2019-08-31 DIAGNOSIS — E782 Mixed hyperlipidemia: Secondary | ICD-10-CM | POA: Diagnosis not present

## 2019-12-14 DIAGNOSIS — E7800 Pure hypercholesterolemia, unspecified: Secondary | ICD-10-CM | POA: Diagnosis not present

## 2019-12-14 DIAGNOSIS — R269 Unspecified abnormalities of gait and mobility: Secondary | ICD-10-CM | POA: Diagnosis not present

## 2019-12-14 DIAGNOSIS — R29898 Other symptoms and signs involving the musculoskeletal system: Secondary | ICD-10-CM | POA: Diagnosis not present

## 2019-12-14 DIAGNOSIS — Z89511 Acquired absence of right leg below knee: Secondary | ICD-10-CM | POA: Diagnosis not present

## 2019-12-14 DIAGNOSIS — M1990 Unspecified osteoarthritis, unspecified site: Secondary | ICD-10-CM | POA: Diagnosis not present

## 2019-12-14 DIAGNOSIS — E782 Mixed hyperlipidemia: Secondary | ICD-10-CM | POA: Diagnosis not present

## 2019-12-14 DIAGNOSIS — M169 Osteoarthritis of hip, unspecified: Secondary | ICD-10-CM | POA: Diagnosis not present

## 2019-12-14 DIAGNOSIS — M810 Age-related osteoporosis without current pathological fracture: Secondary | ICD-10-CM | POA: Diagnosis not present

## 2019-12-14 DIAGNOSIS — I10 Essential (primary) hypertension: Secondary | ICD-10-CM | POA: Diagnosis not present

## 2019-12-27 ENCOUNTER — Inpatient Hospital Stay (HOSPITAL_COMMUNITY)
Admission: EM | Admit: 2019-12-27 | Discharge: 2019-12-31 | DRG: 252 | Disposition: A | Discharge status: Home / Self Care | Payer: Medicare Other | Attending: Vascular Surgery | Admitting: Vascular Surgery

## 2019-12-27 ENCOUNTER — Other Ambulatory Visit: Payer: Self-pay

## 2019-12-27 ENCOUNTER — Encounter (HOSPITAL_COMMUNITY)
Admission: EM | Disposition: A | Discharge status: Home / Self Care | Payer: Self-pay | Source: Home / Self Care | Attending: Vascular Surgery

## 2019-12-27 ENCOUNTER — Telehealth: Payer: Self-pay | Admitting: *Deleted

## 2019-12-27 ENCOUNTER — Encounter (HOSPITAL_COMMUNITY): Payer: Self-pay | Admitting: Emergency Medicine

## 2019-12-27 DIAGNOSIS — I130 Hypertensive heart and chronic kidney disease with heart failure and stage 1 through stage 4 chronic kidney disease, or unspecified chronic kidney disease: Secondary | ICD-10-CM | POA: Diagnosis not present

## 2019-12-27 DIAGNOSIS — Z85828 Personal history of other malignant neoplasm of skin: Secondary | ICD-10-CM

## 2019-12-27 DIAGNOSIS — M810 Age-related osteoporosis without current pathological fracture: Secondary | ICD-10-CM | POA: Diagnosis present

## 2019-12-27 DIAGNOSIS — Z7902 Long term (current) use of antithrombotics/antiplatelets: Secondary | ICD-10-CM

## 2019-12-27 DIAGNOSIS — Z96622 Presence of left artificial elbow joint: Secondary | ICD-10-CM | POA: Diagnosis present

## 2019-12-27 DIAGNOSIS — K5901 Slow transit constipation: Secondary | ICD-10-CM | POA: Diagnosis present

## 2019-12-27 DIAGNOSIS — M79606 Pain in leg, unspecified: Secondary | ICD-10-CM | POA: Diagnosis not present

## 2019-12-27 DIAGNOSIS — Z8249 Family history of ischemic heart disease and other diseases of the circulatory system: Secondary | ICD-10-CM

## 2019-12-27 DIAGNOSIS — I5043 Acute on chronic combined systolic (congestive) and diastolic (congestive) heart failure: Secondary | ICD-10-CM | POA: Diagnosis not present

## 2019-12-27 DIAGNOSIS — D649 Anemia, unspecified: Secondary | ICD-10-CM | POA: Diagnosis present

## 2019-12-27 DIAGNOSIS — I480 Paroxysmal atrial fibrillation: Secondary | ICD-10-CM | POA: Diagnosis present

## 2019-12-27 DIAGNOSIS — N183 Chronic kidney disease, stage 3 unspecified: Secondary | ICD-10-CM | POA: Diagnosis present

## 2019-12-27 DIAGNOSIS — E785 Hyperlipidemia, unspecified: Secondary | ICD-10-CM | POA: Diagnosis present

## 2019-12-27 DIAGNOSIS — I70229 Atherosclerosis of native arteries of extremities with rest pain, unspecified extremity: Secondary | ICD-10-CM

## 2019-12-27 DIAGNOSIS — Z888 Allergy status to other drugs, medicaments and biological substances status: Secondary | ICD-10-CM

## 2019-12-27 DIAGNOSIS — Z993 Dependence on wheelchair: Secondary | ICD-10-CM

## 2019-12-27 DIAGNOSIS — I70222 Atherosclerosis of native arteries of extremities with rest pain, left leg: Secondary | ICD-10-CM | POA: Diagnosis not present

## 2019-12-27 DIAGNOSIS — Z8673 Personal history of transient ischemic attack (TIA), and cerebral infarction without residual deficits: Secondary | ICD-10-CM

## 2019-12-27 DIAGNOSIS — I998 Other disorder of circulatory system: Secondary | ICD-10-CM

## 2019-12-27 DIAGNOSIS — Z882 Allergy status to sulfonamides status: Secondary | ICD-10-CM

## 2019-12-27 DIAGNOSIS — M797 Fibromyalgia: Secondary | ICD-10-CM | POA: Diagnosis present

## 2019-12-27 DIAGNOSIS — K5900 Constipation, unspecified: Secondary | ICD-10-CM | POA: Diagnosis not present

## 2019-12-27 DIAGNOSIS — H353290 Exudative age-related macular degeneration, unspecified eye, stage unspecified: Secondary | ICD-10-CM | POA: Diagnosis present

## 2019-12-27 DIAGNOSIS — Z87891 Personal history of nicotine dependence: Secondary | ICD-10-CM

## 2019-12-27 DIAGNOSIS — Z96652 Presence of left artificial knee joint: Secondary | ICD-10-CM | POA: Diagnosis present

## 2019-12-27 DIAGNOSIS — Z03818 Encounter for observation for suspected exposure to other biological agents ruled out: Secondary | ICD-10-CM | POA: Diagnosis not present

## 2019-12-27 DIAGNOSIS — Z20822 Contact with and (suspected) exposure to covid-19: Secondary | ICD-10-CM | POA: Diagnosis present

## 2019-12-27 DIAGNOSIS — Z89511 Acquired absence of right leg below knee: Secondary | ICD-10-CM

## 2019-12-27 DIAGNOSIS — G894 Chronic pain syndrome: Secondary | ICD-10-CM | POA: Diagnosis present

## 2019-12-27 DIAGNOSIS — R339 Retention of urine, unspecified: Secondary | ICD-10-CM | POA: Diagnosis not present

## 2019-12-27 DIAGNOSIS — Z8349 Family history of other endocrine, nutritional and metabolic diseases: Secondary | ICD-10-CM

## 2019-12-27 DIAGNOSIS — Z9842 Cataract extraction status, left eye: Secondary | ICD-10-CM

## 2019-12-27 DIAGNOSIS — Z79899 Other long term (current) drug therapy: Secondary | ICD-10-CM

## 2019-12-27 DIAGNOSIS — K219 Gastro-esophageal reflux disease without esophagitis: Secondary | ICD-10-CM | POA: Diagnosis present

## 2019-12-27 DIAGNOSIS — Z419 Encounter for procedure for purposes other than remedying health state, unspecified: Secondary | ICD-10-CM

## 2019-12-27 DIAGNOSIS — R3911 Hesitancy of micturition: Secondary | ICD-10-CM | POA: Diagnosis present

## 2019-12-27 DIAGNOSIS — Z9841 Cataract extraction status, right eye: Secondary | ICD-10-CM

## 2019-12-27 DIAGNOSIS — M545 Low back pain: Secondary | ICD-10-CM | POA: Diagnosis present

## 2019-12-27 DIAGNOSIS — Z885 Allergy status to narcotic agent status: Secondary | ICD-10-CM

## 2019-12-27 DIAGNOSIS — Z86718 Personal history of other venous thrombosis and embolism: Secondary | ICD-10-CM

## 2019-12-27 DIAGNOSIS — I2510 Atherosclerotic heart disease of native coronary artery without angina pectoris: Secondary | ICD-10-CM | POA: Diagnosis present

## 2019-12-27 DIAGNOSIS — Z79891 Long term (current) use of opiate analgesic: Secondary | ICD-10-CM

## 2019-12-27 DIAGNOSIS — Z961 Presence of intraocular lens: Secondary | ICD-10-CM | POA: Diagnosis present

## 2019-12-27 DIAGNOSIS — E7800 Pure hypercholesterolemia, unspecified: Secondary | ICD-10-CM | POA: Diagnosis present

## 2019-12-27 HISTORY — PX: LOWER EXTREMITY ANGIOGRAM: SHX5508

## 2019-12-27 HISTORY — PX: THROMBECTOMY FEMORAL ARTERY: SHX6406

## 2019-12-27 LAB — CBC WITH DIFFERENTIAL/PLATELET
Abs Immature Granulocytes: 0.1 10*3/uL — ABNORMAL HIGH (ref 0.00–0.07)
Basophils Absolute: 0.1 10*3/uL (ref 0.0–0.1)
Basophils Relative: 1 %
Eosinophils Absolute: 0.3 10*3/uL (ref 0.0–0.5)
Eosinophils Relative: 4 %
HCT: 41.1 % (ref 36.0–46.0)
Hemoglobin: 13.2 g/dL (ref 12.0–15.0)
Immature Granulocytes: 1 %
Lymphocytes Relative: 36 %
Lymphs Abs: 3 10*3/uL (ref 0.7–4.0)
MCH: 31.3 pg (ref 26.0–34.0)
MCHC: 32.1 g/dL (ref 30.0–36.0)
MCV: 97.4 fL (ref 80.0–100.0)
Monocytes Absolute: 0.9 10*3/uL (ref 0.1–1.0)
Monocytes Relative: 11 %
Neutro Abs: 4.1 10*3/uL (ref 1.7–7.7)
Neutrophils Relative %: 47 %
Platelets: 238 10*3/uL (ref 150–400)
RBC: 4.22 MIL/uL (ref 3.87–5.11)
RDW: 13.1 % (ref 11.5–15.5)
WBC: 8.5 10*3/uL (ref 4.0–10.5)
nRBC: 0 % (ref 0.0–0.2)

## 2019-12-27 LAB — COMPREHENSIVE METABOLIC PANEL
ALT: 10 U/L (ref 0–44)
AST: 15 U/L (ref 15–41)
Albumin: 4.1 g/dL (ref 3.5–5.0)
Alkaline Phosphatase: 68 U/L (ref 38–126)
Anion gap: 12 (ref 5–15)
BUN: 36 mg/dL — ABNORMAL HIGH (ref 8–23)
CO2: 19 mmol/L — ABNORMAL LOW (ref 22–32)
Calcium: 9.6 mg/dL (ref 8.9–10.3)
Chloride: 105 mmol/L (ref 98–111)
Creatinine, Ser: 1.5 mg/dL — ABNORMAL HIGH (ref 0.44–1.00)
GFR calc Af Amer: 35 mL/min — ABNORMAL LOW (ref >60)
GFR calc non Af Amer: 31 mL/min — ABNORMAL LOW (ref >60)
Glucose, Bld: 98 mg/dL (ref 70–99)
Potassium: 3.7 mmol/L (ref 3.5–5.1)
Sodium: 136 mmol/L (ref 135–145)
Total Bilirubin: 0.8 mg/dL (ref 0.3–1.2)
Total Protein: 7.3 g/dL (ref 6.5–8.1)

## 2019-12-27 LAB — URINALYSIS, ROUTINE W REFLEX MICROSCOPIC
Bilirubin Urine: NEGATIVE
Glucose, UA: NEGATIVE mg/dL
Ketones, ur: NEGATIVE mg/dL
Nitrite: NEGATIVE
Protein, ur: NEGATIVE mg/dL
Specific Gravity, Urine: 1.012 (ref 1.005–1.030)
WBC, UA: >50 WBC/hpf — ABNORMAL HIGH (ref 0–5)
pH: 5 (ref 5.0–8.0)

## 2019-12-27 LAB — RESPIRATORY PANEL BY RT PCR (FLU A&B, COVID)
Influenza A by PCR: NEGATIVE
Influenza B by PCR: NEGATIVE
SARS Coronavirus 2 by RT PCR: NEGATIVE

## 2019-12-27 LAB — TYPE AND SCREEN
ABO/RH(D): O POS
Antibody Screen: NEGATIVE

## 2019-12-27 SURGERY — THROMBECTOMY, ARTERY, FEMORAL
Anesthesia: General | Laterality: Left

## 2019-12-27 MED ORDER — SODIUM CHLORIDE 0.9% FLUSH: 3.0000 mL | Freq: Once | INTRAVENOUS | Status: DC

## 2019-12-27 SURGICAL SUPPLY — 71 items
ADH SKN CLS APL DERMABOND .7 (GAUZE/BANDAGES/DRESSINGS) ×1
ARMBAND PINK RESTRICT EXTREMIT (MISCELLANEOUS) ×1 IMPLANT
BANDAGE ESMARK 6X9 LF (GAUZE/BANDAGES/DRESSINGS) IMPLANT
BNDG CMPR 9X6 STRL LF SNTH (GAUZE/BANDAGES/DRESSINGS)
BNDG ESMARK 6X9 LF (GAUZE/BANDAGES/DRESSINGS)
CANISTER SUCT 3000ML PPV (MISCELLANEOUS) ×2 IMPLANT
CANNULA VESSEL 3MM 2 BLNT TIP (CANNULA) ×4 IMPLANT
CATH EMB 3FR 80CM (CATHETERS) ×1 IMPLANT
CATH EMB 4FR 80CM (CATHETERS) ×3 IMPLANT
CLIP VESOCCLUDE MED 24/CT (CLIP) ×2 IMPLANT
CLIP VESOCCLUDE MED 6/CT (CLIP) ×1 IMPLANT
CLIP VESOCCLUDE SM WIDE 24/CT (CLIP) ×2 IMPLANT
CLIP VESOCCLUDE SM WIDE 6/CT (CLIP) ×1 IMPLANT
COVER PROBE W GEL 5X96 (DRAPES) IMPLANT
COVER WAND RF STERILE (DRAPES) ×2 IMPLANT
CUFF TOURN SGL QUICK 24 (TOURNIQUET CUFF)
CUFF TOURN SGL QUICK 34 (TOURNIQUET CUFF)
CUFF TOURN SGL QUICK 42 (TOURNIQUET CUFF) IMPLANT
CUFF TRNQT CYL 24X4X16.5-23 (TOURNIQUET CUFF) IMPLANT
CUFF TRNQT CYL 34X4.125X (TOURNIQUET CUFF) IMPLANT
DERMABOND ADVANCED (GAUZE/BANDAGES/DRESSINGS) ×1
DERMABOND ADVANCED .7 DNX12 (GAUZE/BANDAGES/DRESSINGS) ×1 IMPLANT
DRAIN CHANNEL 15F RND FF W/TCR (WOUND CARE) ×1 IMPLANT
DRAPE HALF SHEET 40X57 (DRAPES) IMPLANT
DRAPE X-RAY CASS 24X20 (DRAPES) ×1 IMPLANT
ELECT REM PT RETURN 9FT ADLT (ELECTROSURGICAL) ×2
ELECTRODE REM PT RTRN 9FT ADLT (ELECTROSURGICAL) ×1 IMPLANT
EVACUATOR SILICONE 100CC (DRAIN) ×1 IMPLANT
GAUZE 4X4 16PLY RFD (DISPOSABLE) IMPLANT
GAUZE SPONGE 4X4 12PLY STRL LF (GAUZE/BANDAGES/DRESSINGS) ×1 IMPLANT
GLOVE BIO SURGEON STRL SZ7.5 (GLOVE) ×3 IMPLANT
GLOVE BIOGEL M 6.5 STRL (GLOVE) ×1 IMPLANT
GLOVE BIOGEL PI IND STRL 7.0 (GLOVE) IMPLANT
GLOVE BIOGEL PI IND STRL 7.5 (GLOVE) IMPLANT
GLOVE BIOGEL PI IND STRL 8 (GLOVE) ×1 IMPLANT
GLOVE BIOGEL PI INDICATOR 7.0 (GLOVE) ×1
GLOVE BIOGEL PI INDICATOR 7.5 (GLOVE) ×2
GLOVE BIOGEL PI INDICATOR 8 (GLOVE) ×1
GOWN STRL REUS W/ TWL LRG LVL3 (GOWN DISPOSABLE) ×3 IMPLANT
GOWN STRL REUS W/TWL LRG LVL3 (GOWN DISPOSABLE) ×6
HEMOSTAT SNOW SURGICEL 2X4 (HEMOSTASIS) ×1 IMPLANT
KIT BASIN OR (CUSTOM PROCEDURE TRAY) ×2 IMPLANT
KIT TURNOVER KIT B (KITS) ×2 IMPLANT
MARKER GRAFT CORONARY BYPASS (MISCELLANEOUS) IMPLANT
NS IRRIG 1000ML POUR BTL (IV SOLUTION) ×4 IMPLANT
PACK CV ACCESS (CUSTOM PROCEDURE TRAY) ×1 IMPLANT
PACK PERIPHERAL VASCULAR (CUSTOM PROCEDURE TRAY) ×2 IMPLANT
PAD ARMBOARD 7.5X6 YLW CONV (MISCELLANEOUS) ×4 IMPLANT
SET COLLECT BLD 21X3/4 12 (NEEDLE) ×1 IMPLANT
SPONGE SURGIFOAM ABS GEL 100 (HEMOSTASIS) IMPLANT
STOPCOCK 4 WAY LG BORE MALE ST (IV SETS) ×1 IMPLANT
SUCTION FRAZIER HANDLE 10FR (MISCELLANEOUS) ×1
SUCTION TUBE FRAZIER 10FR DISP (MISCELLANEOUS) IMPLANT
SUT ETHILON 3 0 PS 1 (SUTURE) ×1 IMPLANT
SUT PROLENE 5 0 C 1 24 (SUTURE) ×2 IMPLANT
SUT PROLENE 6 0 BV (SUTURE) ×3 IMPLANT
SUT SILK 2 0 SH (SUTURE) ×2 IMPLANT
SUT SILK 3 0 (SUTURE)
SUT SILK 3-0 18XBRD TIE 12 (SUTURE) IMPLANT
SUT VIC AB 2-0 CTB1 (SUTURE) ×3 IMPLANT
SUT VIC AB 3-0 SH 27 (SUTURE) ×2
SUT VIC AB 3-0 SH 27X BRD (SUTURE) ×2 IMPLANT
SUT VICRYL 4-0 PS2 18IN ABS (SUTURE) ×3 IMPLANT
SYR 3ML LL SCALE MARK (SYRINGE) ×1 IMPLANT
SYR TB 1ML LUER SLIP (SYRINGE) ×1 IMPLANT
TAPE CLOTH SURG 4X10 WHT LF (GAUZE/BANDAGES/DRESSINGS) ×1 IMPLANT
TOWEL GREEN STERILE (TOWEL DISPOSABLE) ×2 IMPLANT
TRAY FOLEY MTR SLVR 16FR STAT (SET/KITS/TRAYS/PACK) ×2 IMPLANT
TUBING EXTENTION W/L.L. (IV SETS) ×1 IMPLANT
UNDERPAD 30X30 (UNDERPADS AND DIAPERS) ×2 IMPLANT
WATER STERILE IRR 1000ML POUR (IV SOLUTION) ×2 IMPLANT

## 2019-12-27 NOTE — ED Provider Notes (Signed)
[facility] EMERGENCY DEPARTMENT Provider Note   CSN: [id number] Arrival date & time: 12/27/19  1800     History Chief Complaint  Patient presents with  . Leg Pain    left foot pulseless    [name] is a 86 y.o. female.  The history is provided by the patient and medical records.    86 year old female with history of anemia, A. fib, chronic back pain, peripheral arterial disease, GERD, presenting to the ED with pulseless left foot.  Daughter reports due to her extensive vascular history, they do frequently check pulses in her foot, this morning upon waking foot was extremely cold, white in color, and daughter could not feel a pulse.  They monitor this throughout the day without improvement.  Daughter did call vascular office and they were told to come to ED for further evaluation.  She is status post right BKA due to similar circumstances.  Left leg status post iliac stenting as well as femoropopliteal bypass, then became occluded and underwent anterior tibial bypass.  She is generally wheelchair-bound as she is unable to walk with prosthesis.  She does take plavix.  Past Medical History:  Diagnosis Date  . Anemia   . Anginal pain ([place])   . Arthritis    "qwhere" (09/05/2016)  . Atrial fibrillation ([place]) 09/2016  . Chronic lower back pain   . Complication of anesthesia    "takes a long time for it to wear off; I can hallucinate if I take too much" (09/05/2016)  . DVT (deep venous thrombosis) ([place]) 10/2009  . Fall from steps 08/31/2013   Fx. pelvis, Left Hip, Left Elbow  . Fibromyalgia   . GERD (gastroesophageal reflux disease)    09/22/16- "no too much anymore"  . GI bleed 10/24/2016  . High cholesterol   . History of blood transfusion   . History of hiatal hernia   . Hypertension   . Macular degeneration, wet ([place])    "started in right eye; now legally blind in that eye; now started in left eye but pretty much in control" (09/05/2016)  .  Osteoporosis   . Peripheral vascular disease ([place])    nonviable tissue Right foot  . PONV (postoperative nausea and vomiting)   . Squamous cell carcinoma of skin of right calf Aug. 2015  . Stroke [facility])    TIA's no residual  . TIA (transient ischemic attack)    "several at once; none in a long time" (09/05/2016)    Patient Active Problem List   Diagnosis Date Noted  . Plantar fasciitis of left foot 06/10/2017  . Idiopathic chronic venous hypertension of both lower extremities with inflammation 05/21/2017  . Foot drop, left 05/21/2017  . Decubitus ulcer of sacral region, unstageable ([place])   . Deep tissue injury   . Hypokalemia   . Hypoalbuminemia due to protein-calorie malnutrition ([place])   . Debilitated 01/21/2017  . Neuropathic pain   . Post-operative pain   . Slow transit constipation   . Debility   . Ischemic leg   . PAF (paroxysmal atrial fibrillation) ([place])   . Anemia of chronic disease   . Chronic pain syndrome   . Fibromyalgia   . Stage 3 chronic kidney disease   . Benign essential HTN   . Abnormal urinalysis   . PVD (peripheral vascular disease) ([place]) 01/13/2017  . Post-op pain 01/03/2017  . Phantom limb pain ([place]) 01/03/2017  . S/P unilateral BKA (below knee amputation), right ([place]) 12/30/2016  .  Acute on chronic combined systolic and diastolic CHF (congestive heart failure) ([place]) 12/22/2016  . Anemia 12/22/2016  . Palliative care encounter   . Lower extremity pain, right   . Acute GI bleeding 10/23/2016  . Paroxysmal atrial fibrillation ([place]) 10/03/2016  . Atherosclerosis of autologous vein bypass graft of left lower extremity with rest pain ([place]) 09/24/2016  . PAD (peripheral artery disease) ([place]) 09/05/2016  . Groin pain 04/02/2015  . Aftercare following surgery of the circulatory system, [place] 12/13/2013  . Shingles 10/26/2013  . Anxiety 10/26/2013  . Acute blood loss anemia 09/05/2013  . Elbow fracture, left 09/05/2013  . Left acetabular fracture ([place])  09/05/2013  . Ankle fracture, left 09/05/2013  . HTN (hypertension) 09/03/2013  . HLD (hyperlipidemia) 09/03/2013  . Peripheral vascular disease, unspecified ([place]) 05/10/2012  . Chronic total occlusion of artery of the extremities ([place]) 01/19/2012    Past Surgical History:  Procedure Laterality Date  . ABDOMINAL AORTAGRAM N/A 12/26/2014   Procedure: ABDOMINAL Maxcine Ham;  Surgeon: Serafina Mitchell, MD;  Location: [facility] CATH LAB;  Service: Cardiovascular;  Laterality: N/A;  . AMPUTATION Right 12/23/2016   Procedure: AMPUTATION BELOW KNEE;  Surgeon: Serafina Mitchell, MD;  Location: [facility];  Service: Vascular;  Laterality: Right;  . AORTOGRAM N/A 09/26/2016   Procedure: AORTOGRAM;  Surgeon: Waynetta Sandy, MD;  Location: [facility];  Service: Vascular;  Laterality: N/A;  . CARPAL TUNNEL RELEASE Right   . CATARACT EXTRACTION W/ INTRAOCULAR LENS  IMPLANT, BILATERAL Bilateral   . COLONOSCOPY    . DILATION AND CURETTAGE OF UTERUS    . DRESSING CHANGE UNDER ANESTHESIA Right 01/16/2017   Procedure: DRESSING CHANGE RIGHT BELOW KNEE AMPUTATION;  Surgeon: Serafina Mitchell, MD;  Location: [facility];  Service: Vascular;  Laterality: Right;  . EYE SURGERY Right    "macular OR"  . FEMORAL ARTERY STENT  12-11-10   Left SFA  . FEMORAL-POPLITEAL BYPASS GRAFT Left 09/24/2016   Procedure: REDO FEMORAL TO POPLITEAL ARTERY BYPASS GRAFT USING 6MM PROPATEN RINGED GORTEX GRAFT;  Surgeon: Serafina Mitchell, MD;  Location: [facility];  Service: Vascular;  Laterality: Left;  . FEMORAL-TIBIAL BYPASS GRAFT Left 01/16/2017   Procedure: REDO BYPASS GRAFT FEMORAL-TIBIAL ARTERY WITH GORTEX GRAFT;  Surgeon: Serafina Mitchell, MD;  Location: [facility];  Service: Vascular;  Laterality: Left;  AND LOWER LEG   . I & D EXTREMITY Right 12/17/2016   Procedure: IRRIGATION AND DEBRIDEMENT RIGHT FOOT;  Surgeon: Serafina Mitchell, MD;  Location: [facility];  Service: Vascular;  Laterality: Right;  . INCISION AND DRAINAGE OF WOUND Left 10/25/2009   leg/notes  11/13/2009  . INSERTION OF ILIAC STENT Left 12/26/2014   Procedure: INSERTION OF ILIAC STENT;  Surgeon: Serafina Mitchell, MD;  Location: [facility] CATH LAB;  Service: Cardiovascular;  Laterality: Left;  . INSERTION OF ILIAC STENT Left 09/26/2016   Procedure: SUB INTIMAL INSERTION OF SUPERFICIAL FEMORAL ARTERY AND BELOW KNEE BYPASS GRAFT;  Surgeon: Waynetta Sandy, MD;  Location: [place];  Service: Vascular;  Laterality: Left;  . JOINT REPLACEMENT     knee  . JOINT REPLACEMENT Left Oct. 17, 2014   Elbow ( pt fell 08-31-13 )  . LOWER EXTREMITY ANGIOGRAPHY N/A 09/21/2018   Procedure: LOWER EXTREMITY ANGIOGRAPHY;  Surgeon: Serafina Mitchell, MD;  Location: [place] CV LAB;  Service: Cardiovascular;  Laterality: N/A;  . ORIF SHOULDER FRACTURE Right    "it was crushed"  . PERIPHERAL VASCULAR CATHETERIZATION N/A 10/30/2015   Procedure: Abdominal  Aortogram w/Lower Extremity;  Surgeon: Serafina Mitchell, MD;  Location: [facility] CV LAB;  Service: Cardiovascular;  Laterality: N/A;  . PERIPHERAL VASCULAR CATHETERIZATION  10/30/2015   Procedure: Peripheral Vascular Intervention;  Surgeon: Serafina Mitchell, MD;  Location: [facility] CV LAB;  Service: Cardiovascular;;  . PERIPHERAL VASCULAR CATHETERIZATION N/A 04/01/2016   Procedure: Abdominal Aortogram w/Lower Extremity;  Surgeon: Serafina Mitchell, MD;  Location: [facility] CV LAB;  Service: Cardiovascular;  Laterality: N/A;  . PERIPHERAL VASCULAR CATHETERIZATION Left 04/01/2016   Procedure: Peripheral Vascular Atherectomy;  Surgeon: Serafina Mitchell, MD;  Location: [facility] CV LAB;  Service: Cardiovascular;  Laterality: Left;  Superficial femoral artery.  Marland Kitchen PERIPHERAL VASCULAR CATHETERIZATION Right 09/05/2016   "stent"  . PERIPHERAL VASCULAR CATHETERIZATION N/A 09/05/2016   Procedure: Abdominal Aortogram w/Lower Extremity;  Surgeon: Serafina Mitchell, MD;  Location: [place] CV LAB;  Service: Cardiovascular;  Laterality: N/A;  . PERIPHERAL VASCULAR  CATHETERIZATION Right 09/05/2016   Procedure: Peripheral Vascular Intervention;  Surgeon: Serafina Mitchell, MD;  Location: [place] CV LAB;  Service: Cardiovascular;  Laterality: Right;  Superficial Femoral  . PERIPHERAL VASCULAR CATHETERIZATION Left 09/09/2016   Procedure: Lower Extremity Angiography;  Surgeon: Serafina Mitchell, MD;  Location: [place] CV LAB;  Service: Cardiovascular;  Laterality: Left;  . PR VEIN BYPASS GRAFT,AORTO-FEM-POP  09-13-09   Left Fem-pop  . THROMBECTOMY FEMORAL ARTERY Right 09/26/2016   Procedure: Thromboembolectomy Right Lower Extremity, Right Femoral Artery Endarterectomy with Patch Angioplasty; Right Lower Extremity Angiogram ;  Surgeon: Waynetta Sandy, MD;  Location: [facility];  Service: Vascular;  Laterality: Right;  . TOTAL ELBOW ARTHROPLASTY Left 09/03/2013   Procedure: LEFT TOTAL ELBOW ARTHROPLASTY;  Surgeon: Roseanne Kaufman, MD;  Location: [facility];  Service: Orthopedics;  Laterality: Left;  . TOTAL KNEE ARTHROPLASTY Left 06/2006  . TRANSMETATARSAL AMPUTATION Right 12/11/2016   Procedure: TRANSMETATARSAL AMPUTATION;  Surgeon: Serafina Mitchell, MD;  Location: [facility];  Service: Vascular;  Laterality: Right;  . TUBAL LIGATION    . VAGINAL HYSTERECTOMY       OB History   No obstetric history on file.     Family History  Problem Relation Age of Onset  . Heart disease Father        Heart Disease before age 23  . Hyperlipidemia Father   . Hypertension Father   . Alcohol abuse Father   . Heart disease Brother   . Hyperlipidemia Brother   . Hypertension Brother   . Deep vein thrombosis Brother   . Heart disease Son        Heart Disease before age 23  . Hyperlipidemia Son   . Hypertension Son   . Heart attack Son   . Diabetes Son   . Hypertension Son   . Hyperlipidemia Sister   . Hypertension Sister     Social History   Tobacco Use  . Smoking status: Former Smoker    Types: Cigarettes    Quit date: 11/17/1946    Years since quitting:  73.1  . Smokeless tobacco: Never Used  . Tobacco comment: "never smoked much"  Substance Use Topics  . Alcohol use: No    Alcohol/week: 0.0 standard drinks  . Drug use: No    Home Medications Prior to Admission medications   Medication Sig Start Date End Date Taking? Authorizing Provider  acetaminophen (TYLENOL) 500 MG tablet Take 1,000 mg by mouth 2 (two) times daily as needed for moderate pain or headache. Patient took this  medication for her pain.    [provider]  Cholecalciferol (VITAMIN D3) 400 units tablet Take 400 Units by mouth at bedtime.     [provider]  clopidogrel (PLAVIX) 75 MG tablet Take 1 tablet (75 mg total) by mouth daily. 01/28/17   Angiulli, Lavon Paganini, PA-C  Cyanocobalamin (VITAMIN B-12) 5000 MCG TBDP Take 5,000 mcg by mouth daily.     [provider]  diazepam (VALIUM) 5 MG tablet Take 0.5 tablets (2.5 mg total) by mouth at bedtime as needed for anxiety. Patient taking differently: Take 2.5-5 mg by mouth daily as needed for anxiety.  01/28/17   Angiulli, Lavon Paganini, PA-C  furosemide (LASIX) 20 MG tablet Take 20 mg by mouth every Monday, Wednesday, and Friday.    [provider]  gabapentin (NEURONTIN) 300 MG capsule Take 600 mg by mouth 2 (two) times daily.    [provider]  loperamide (IMODIUM) 2 MG capsule Take 2 mg by mouth as needed for diarrhea or loose stools.    [provider]  metoprolol tartrate (LOPRESSOR) 25 MG tablet Take 1 tablet (25 mg total) by mouth at bedtime. 01/28/17   Angiulli, Lavon Paganini, PA-C  Multiple Vitamins-Minerals (EYE VITAMINS PO) Take 1 tablet by mouth at bedtime.     [provider]  oxycodone (OXY-IR) 5 MG capsule Take 5-10 mg by mouth every 4 (four) hours as needed for pain.    [provider]  potassium chloride (K-DUR,KLOR-CON) 10 MEQ tablet Take 10 mEq by mouth daily.    [provider]  valsartan-hydrochlorothiazide (DIOVAN-HCT) 160-12.5 MG tablet Take  0.5 tablets by mouth daily. 01/28/17   Angiulli, Lavon Paganini, PA-C    Allergies    Motrin [ibuprofen], Statins, Eliquis [apixaban], Morphine and related, Oxycontin [oxycodone hcl], Promethazine hcl, and Sulfa antibiotics  Review of Systems   Review of Systems  Skin: Positive for color change.  All other systems reviewed and are negative.   Physical Exam Updated Vital Signs BP (!) 179/71 (BP Location: Right Arm)   Pulse 86   Temp 98.4 F (36.9 C) (Oral)   Resp 18   SpO2 100%   Physical Exam Vitals and nursing note reviewed.  Constitutional:      Appearance: She is well-developed.  HENT:     Head: Normocephalic and atraumatic.  Eyes:     Conjunctiva/sclera: Conjunctivae normal.     Pupils: Pupils are equal, round, and reactive to light.  Cardiovascular:     Rate and Rhythm: Normal rate and regular rhythm.     Heart sounds: Normal heart sounds.  Pulmonary:     Effort: Pulmonary effort is normal.     Breath sounds: Normal breath sounds.  Abdominal:     General: Bowel sounds are normal.     Palpations: Abdomen is soft.  Musculoskeletal:        General: Normal range of motion.     Cervical back: Normal range of motion.     Comments: No palpable pulse in left foot With doppler-- able to obtain very faint DP pulse but this is slow and very delayed, no dopplerable PT pulse, foot is cold to touch, cap refill is very delayed Right BKA  Skin:    General: Skin is warm and dry.  Neurological:     Mental Status: She is alert and oriented to person, place, and time.     ED Results / Procedures / Treatments   Labs (all labs ordered are listed, but only abnormal  results are displayed) Labs Reviewed  COMPREHENSIVE METABOLIC PANEL - Abnormal; Notable for the following components:      Result Value   CO2 19 (*)    BUN 36 (*)    Creatinine, Ser 1.50 (*)    GFR calc non Af Amer 31 (*)    GFR calc Af Amer 35 (*)    All other components within normal limits  CBC WITH  DIFFERENTIAL/PLATELET - Abnormal; Notable for the following components:   Abs Immature Granulocytes 0.10 (*)    All other components within normal limits  URINALYSIS, ROUTINE W REFLEX MICROSCOPIC - Abnormal; Notable for the following components:   APPearance CLOUDY (*)    Hgb urine dipstick SMALL (*)    Leukocytes,Ua LARGE (*)    WBC, UA >50 (*)    Bacteria, UA MANY (*)    All other components within normal limits  RESPIRATORY PANEL BY RT PCR (FLU A&B, COVID)  TYPE AND SCREEN    EKG None  Radiology No results found.  Procedures Procedures (including critical care time)  CRITICAL CARE Performed by: Larene Pickett   Total critical care time: 45 minutes  Critical care time was exclusive of separately billable procedures and treating other patients.  Critical care was necessary to treat or prevent imminent or life-threatening deterioration.  Critical care was time spent personally by me on the following activities: development of treatment plan with patient and/or surrogate as well as nursing, discussions with consultants, evaluation of patient's response to treatment, examination of patient, obtaining history from patient or surrogate, ordering and performing treatments and interventions, ordering and review of laboratory studies, ordering and review of radiographic studies, pulse oximetry and re-evaluation of patient's condition.   Medications Ordered in ED Medications  sodium chloride flush (NS) 0.9 % injection 3 mL (has no administration in time range)    ED Course  I have reviewed the triage vital signs and the nursing notes.  Pertinent labs & imaging results that were available during my care of the patient were reviewed by me and considered in my medical decision making (see chart for details).    MDM Rules/Calculators/A&P  86 y.o. F here with ischemic left leg.  States increased pain since this morning, no improvement so daughter called vascular office and sent in  for further evaluation.  She is s/p right BKA for similar circumstances and is s/p multiple interventions to left leg already.  She is afebrile, non-toxic.  Left foot is cold to the touch, cap refill is slow without palpable pulse.  With doppler, able to obtain very faint DP pulse but very delayed.  She is on plavix.  Will discuss with vascular surgery for recommendations.  10:18 PM Discussed with vascular, Dr. Scot Dock-- he will come down to evaluate  Dr. Scot Dock has evaluated in the ED-- will admit and plan for emergent thrombectomy.    Final Clinical Impression(s) / ED Diagnoses Final diagnoses:  Ischemic leg    Rx / DC Orders ED Discharge Orders    None       Larene Pickett, PA-C 12/28/19 0035    Carmin Muskrat, MD 12/28/19 1019

## 2019-12-27 NOTE — Telephone Encounter (Signed)
Patient daughter called states patient leg feels like it is asleep and it cool to touch they cannot feel a pulse advised daughter to take patient to ER for evaluation . Patient daughter verbalized understanding.

## 2019-12-27 NOTE — ED Triage Notes (Signed)
Pt brought in by her dtr and states pt's left foot is cold/ pulseless. Pt has hx of the same and had BKA of the right leg. Pt complains of pain in left foot. Bottom of toes have purpleish coloring.

## 2019-12-27 NOTE — H&P (Signed)
ASSESSMENT & PLAN   ACUTE LEFT LOWER EXTREMITY ISCHEMIA: This patient has had multiple revascularization attempts in the left lower extremity.  Most recently she had a left deep femoral artery to anterior tibial artery bypass with a prosthetic graft in 2018.  She was last seen in the office in May 2020.  At that time her graft was patent.  Her graft is now occluded with no blood flow to the left foot and clearly limb threatening ischemia.  I have explained that the only option for attempted limb salvage is thrombectomy of the graft.  Given that this occurred at 4 AM today, I do not think she is a good candidate for thrombolysis as this would be more time-consuming.  I have recommended emergent thrombectomy of her graft.  We have sent the Covid test required by the OR and this is pending.  I have discussed the indications for the procedure and the potential complications with the patient and her daughter.  She is in agreement to proceed urgently.  She last ate at 1 PM.  REASON FOR ADMISSION:    Ischemic left lower extremity.  HPI:   [name] is a 86 y.o. female who is undergone a previous right below the knee amputation in February 2018.  She is had multiple procedures in the left leg dating back to 2010 including iliac stenting, a left femoral to above-knee popliteal artery bypass with PTFE, and outflow revascularization.  Most recently she had a deep femoral artery to anterior tibial artery bypass with PTFE in March 2018.  She awoke this morning at approximately 4 AM with numbness and coldness of her left foot which has been persistent.  She felt that maybe the leg was getting better but then decided that it was not in call the office this afternoon and was sent to the emergency department.  Of note she had some elevated velocities in the proximal anastomosis and underwent an arteriogram in November 2019 which showed no significant stenoses.  On her most recent follow-up duplex there were  some elevated velocities at the proximal anastomosis.  Peak systolic velocity was 123456 cm/s.  This is consistent with a 50 to 70% stenosis.  Her ABI was 72%.  The patient has a deformity of her left foot and therefore does not use a prosthesis and is nonambulatory.  The patient does have a history of hypertension.  She is not a smoker.  She has no previous history of myocardial infarction or congestive heart failure.  She is on Plavix but no other blood thinners.  She last ate at 1 PM today.   Past Medical History:  Diagnosis Date  . Anemia   . Anginal pain ([place])   . Arthritis    "qwhere" (09/05/2016)  . Atrial fibrillation ([place]) 09/2016  . Chronic lower back pain   . Complication of anesthesia    "takes a long time for it to wear off; I can hallucinate if I take too much" (09/05/2016)  . DVT (deep venous thrombosis) ([place]) 10/2009  . Fall from steps 08/31/2013   Fx. pelvis, Left Hip, Left Elbow  . Fibromyalgia   . GERD (gastroesophageal reflux disease)    09/22/16- "no too much anymore"  . GI bleed 10/24/2016  . High cholesterol   . History of blood transfusion   . History of hiatal hernia   . Hypertension   . Macular degeneration, wet ([place])    "started in right eye; now legally blind in that eye;  now started in left eye but pretty much in control" (09/05/2016)  . Osteoporosis   . Peripheral vascular disease ([place])    nonviable tissue Right foot  . PONV (postoperative nausea and vomiting)   . Squamous cell carcinoma of skin of right calf Aug. 2015  . Stroke ([place])    TIA's no residual  . TIA (transient ischemic attack)    "several at once; none in a long time" (09/05/2016)    Family History  Problem Relation Age of Onset  . Heart disease Father        Heart Disease before age 68  . Hyperlipidemia Father   . Hypertension Father   . Alcohol abuse Father   . Heart disease Brother   . Hyperlipidemia Brother   . Hypertension Brother   . Deep vein thrombosis Brother   .  Heart disease Son        Heart Disease before age 73  . Hyperlipidemia Son   . Hypertension Son   . Heart attack Son   . Diabetes Son   . Hypertension Son   . Hyperlipidemia Sister   . Hypertension Sister     SOCIAL HISTORY: Social History   Tobacco Use  . Smoking status: Former Smoker    Types: Cigarettes    Quit date: 11/17/1946    Years since quitting: 73.1  . Smokeless tobacco: Never Used  . Tobacco comment: "never smoked much"  Substance Use Topics  . Alcohol use: No    Alcohol/week: 0.0 standard drinks    Allergies  Allergen Reactions  . Motrin [Ibuprofen] Other (See Comments)    ADVERSE REACTION - GI BLEED  . Statins Other (See Comments)    ADVERSE REACTION MUSCLE PAIN & WEAKNESS  . Eliquis [Apixaban] Other (See Comments)    Lower GI bleeding  . Morphine And Related Other (See Comments)    HALLUCINATIONS REACTION IS SIDE EFFECT  . Oxycontin [Oxycodone Hcl] Other (See Comments)    [REACTION IS SIDE EFFECT]  > HALLUCINATIONS  . Promethazine Hcl Other (See Comments)    IV  Drug only, makes her act crazy  . Sulfa Antibiotics Nausea And Vomiting    Current Facility-Administered Medications  Medication Dose Route Frequency Provider Last Rate Last Admin  . sodium chloride flush (NS) 0.9 % injection 3 mL  3 mL Intravenous Once Lajean Saver, MD       Current Outpatient Medications  Medication Sig Dispense Refill  . acetaminophen (TYLENOL) 500 MG tablet Take 1,000 mg by mouth 2 (two) times daily as needed for moderate pain or headache. Patient took this medication for her pain.    . Cholecalciferol (VITAMIN D3) 400 units tablet Take 400 Units by mouth at bedtime.     . clopidogrel (PLAVIX) 75 MG tablet Take 1 tablet (75 mg total) by mouth daily. 30 tablet 1  . Cyanocobalamin (VITAMIN B-12) 5000 MCG TBDP Take 5,000 mcg by mouth daily.     . diazepam (VALIUM) 5 MG tablet Take 0.5 tablets (2.5 mg total) by mouth at bedtime as needed for anxiety. (Patient taking  differently: Take 2.5-5 mg by mouth daily as needed for anxiety. ) 20 tablet 0  . furosemide (LASIX) 20 MG tablet Take 20 mg by mouth every Monday, Wednesday, and Friday.    . gabapentin (NEURONTIN) 300 MG capsule Take 600 mg by mouth 2 (two) times daily.    Marland Kitchen loperamide (IMODIUM) 2 MG capsule Take 2 mg by mouth as needed for diarrhea or loose stools.    Marland Kitchen  metoprolol tartrate (LOPRESSOR) 25 MG tablet Take 1 tablet (25 mg total) by mouth at bedtime. 30 tablet 1  . Multiple Vitamins-Minerals (EYE VITAMINS PO) Take 1 tablet by mouth at bedtime.     Marland Kitchen oxycodone (OXY-IR) 5 MG capsule Take 5-10 mg by mouth every 4 (four) hours as needed for pain.    . potassium chloride (K-DUR,KLOR-CON) 10 MEQ tablet Take 10 mEq by mouth daily.    . valsartan-hydrochlorothiazide (DIOVAN-HCT) 160-12.5 MG tablet Take 0.5 tablets by mouth daily. 30 tablet 0    REVIEW OF SYSTEMS:  [X]  denotes positive finding, [ ]  denotes negative finding Cardiac  Comments:  Chest pain or chest pressure:    Shortness of breath upon exertion:    Short of breath when lying flat:    Irregular heart rhythm:        Vascular    Pain in calf, thigh, or hip brought on by ambulation:    Pain in feet at night that wakes you up from your sleep:  x   Blood clot in your veins:    Leg swelling:         Pulmonary    Oxygen at home:    Productive cough:     Wheezing:         Neurologic    Sudden weakness in arms or legs:     Sudden numbness in arms or legs:  x  left leg  Sudden onset of difficulty speaking or slurred speech:    Temporary loss of vision in one eye:     Problems with dizziness:         Gastrointestinal    Blood in stool:     Vomited blood:         Genitourinary    Burning when urinating:     Blood in urine:        Psychiatric    Major depression:         Hematologic    Bleeding problems:    Problems with blood clotting too easily:        Skin    Rashes or ulcers:        Constitutional    Fever or chills:     -  PHYSICAL EXAM:   Vitals:   12/27/19 1810 12/27/19 2014  BP: (!) 173/73 (!) 179/71  Pulse: 85 86  Resp: 12 18  Temp: 98.2 F (36.8 C) 98.4 F (36.9 C)  TempSrc: Oral Oral  SpO2: 100% 100%   There is no height or weight on file to calculate BMI. GENERAL: The patient is a well-nourished female, in no acute distress. The vital signs are documented above. CARDIAC: There is a regular rate and rhythm.  VASCULAR: She has a left carotid bruit. She has palpable femoral pulses. I cannot get any Doppler signals in the left foot which is cold with decreased motor and sensory function. PULMONARY: There is good air exchange bilaterally without wheezing or rales. ABDOMEN: Soft and non-tender with normal pitched bowel sounds.  MUSCULOSKELETAL: There are no major deformities. NEUROLOGIC: No focal weakness or paresthesias are detected. SKIN: There are no ulcers or rashes noted. PSYCHIATRIC: The patient has a normal affect.  DATA:    Lab Results  Component Value Date   WBC 8.5 12/27/2019   HGB 13.2 12/27/2019   HCT 41.1 12/27/2019   MCV 97.4 12/27/2019   PLT 238 12/27/2019   Lab Results  Component Value Date   NA 136 12/27/2019   K  3.7 12/27/2019   CL 105 12/27/2019   CO2 19 (L) 12/27/2019   Lab Results  Component Value Date   CREATININE 1.50 (H) 12/27/2019   Lab Results  Component Value Date   INR 0.98 07/08/2018   INR 1.06 01/16/2017   INR 1.01 01/14/2017   Lab Results  Component Value Date   HGBA1C  02/11/2009    5.6 (NOTE)   The ADA recommends the following therapeutic goal for glycemic   control related to Hgb A1C measurement:   Goal of Therapy:   < 7.0% Hgb A1C   Reference: American Diabetes Association: Clinical Practice   Recommendations 2008, Diabetes Care,  2008, 31:(Suppl 1).   CBG (last 3)   ARTERIOGRAM: Her arteriogram from November 2019 is offline and I cannot review these images. Covid test is pending.   Deitra Mayo [facility]: [phone] Office: [phone]

## 2019-12-28 ENCOUNTER — Emergency Department (HOSPITAL_COMMUNITY): Payer: Medicare Other

## 2019-12-28 ENCOUNTER — Emergency Department (HOSPITAL_COMMUNITY): Payer: Medicare Other | Admitting: Registered Nurse

## 2019-12-28 DIAGNOSIS — T82868A Thrombosis of vascular prosthetic devices, implants and grafts, initial encounter: Secondary | ICD-10-CM | POA: Diagnosis not present

## 2019-12-28 DIAGNOSIS — I998 Other disorder of circulatory system: Secondary | ICD-10-CM | POA: Diagnosis present

## 2019-12-28 DIAGNOSIS — I5043 Acute on chronic combined systolic (congestive) and diastolic (congestive) heart failure: Secondary | ICD-10-CM | POA: Diagnosis not present

## 2019-12-28 DIAGNOSIS — E7800 Pure hypercholesterolemia, unspecified: Secondary | ICD-10-CM | POA: Diagnosis not present

## 2019-12-28 DIAGNOSIS — Z96652 Presence of left artificial knee joint: Secondary | ICD-10-CM | POA: Diagnosis present

## 2019-12-28 DIAGNOSIS — Z993 Dependence on wheelchair: Secondary | ICD-10-CM | POA: Diagnosis not present

## 2019-12-28 DIAGNOSIS — K219 Gastro-esophageal reflux disease without esophagitis: Secondary | ICD-10-CM | POA: Diagnosis not present

## 2019-12-28 DIAGNOSIS — K5901 Slow transit constipation: Secondary | ICD-10-CM | POA: Diagnosis present

## 2019-12-28 DIAGNOSIS — Z20822 Contact with and (suspected) exposure to covid-19: Secondary | ICD-10-CM | POA: Diagnosis not present

## 2019-12-28 DIAGNOSIS — M545 Low back pain: Secondary | ICD-10-CM | POA: Diagnosis not present

## 2019-12-28 DIAGNOSIS — I340 Nonrheumatic mitral (valve) insufficiency: Secondary | ICD-10-CM | POA: Diagnosis not present

## 2019-12-28 DIAGNOSIS — I130 Hypertensive heart and chronic kidney disease with heart failure and stage 1 through stage 4 chronic kidney disease, or unspecified chronic kidney disease: Secondary | ICD-10-CM | POA: Diagnosis not present

## 2019-12-28 DIAGNOSIS — H353290 Exudative age-related macular degeneration, unspecified eye, stage unspecified: Secondary | ICD-10-CM | POA: Diagnosis not present

## 2019-12-28 DIAGNOSIS — Z89511 Acquired absence of right leg below knee: Secondary | ICD-10-CM | POA: Diagnosis not present

## 2019-12-28 DIAGNOSIS — M797 Fibromyalgia: Secondary | ICD-10-CM | POA: Diagnosis not present

## 2019-12-28 DIAGNOSIS — M79606 Pain in leg, unspecified: Secondary | ICD-10-CM | POA: Diagnosis not present

## 2019-12-28 DIAGNOSIS — I744 Embolism and thrombosis of arteries of extremities, unspecified: Secondary | ICD-10-CM | POA: Diagnosis not present

## 2019-12-28 DIAGNOSIS — E785 Hyperlipidemia, unspecified: Secondary | ICD-10-CM | POA: Diagnosis not present

## 2019-12-28 DIAGNOSIS — I480 Paroxysmal atrial fibrillation: Secondary | ICD-10-CM | POA: Diagnosis not present

## 2019-12-28 DIAGNOSIS — M810 Age-related osteoporosis without current pathological fracture: Secondary | ICD-10-CM | POA: Diagnosis present

## 2019-12-28 DIAGNOSIS — I70229 Atherosclerosis of native arteries of extremities with rest pain, unspecified extremity: Secondary | ICD-10-CM | POA: Diagnosis present

## 2019-12-28 DIAGNOSIS — I10 Essential (primary) hypertension: Secondary | ICD-10-CM | POA: Diagnosis not present

## 2019-12-28 DIAGNOSIS — R0600 Dyspnea, unspecified: Secondary | ICD-10-CM | POA: Diagnosis not present

## 2019-12-28 DIAGNOSIS — Z961 Presence of intraocular lens: Secondary | ICD-10-CM | POA: Diagnosis present

## 2019-12-28 DIAGNOSIS — Z79899 Other long term (current) drug therapy: Secondary | ICD-10-CM | POA: Diagnosis not present

## 2019-12-28 DIAGNOSIS — I739 Peripheral vascular disease, unspecified: Secondary | ICD-10-CM | POA: Diagnosis not present

## 2019-12-28 DIAGNOSIS — Z96622 Presence of left artificial elbow joint: Secondary | ICD-10-CM | POA: Diagnosis present

## 2019-12-28 DIAGNOSIS — Z8719 Personal history of other diseases of the digestive system: Secondary | ICD-10-CM | POA: Diagnosis not present

## 2019-12-28 DIAGNOSIS — D649 Anemia, unspecified: Secondary | ICD-10-CM | POA: Diagnosis not present

## 2019-12-28 DIAGNOSIS — I70222 Atherosclerosis of native arteries of extremities with rest pain, left leg: Secondary | ICD-10-CM | POA: Diagnosis not present

## 2019-12-28 DIAGNOSIS — N183 Chronic kidney disease, stage 3 unspecified: Secondary | ICD-10-CM | POA: Diagnosis present

## 2019-12-28 DIAGNOSIS — G894 Chronic pain syndrome: Secondary | ICD-10-CM | POA: Diagnosis present

## 2019-12-28 DIAGNOSIS — I2510 Atherosclerotic heart disease of native coronary artery without angina pectoris: Secondary | ICD-10-CM | POA: Diagnosis not present

## 2019-12-28 DIAGNOSIS — R3911 Hesitancy of micturition: Secondary | ICD-10-CM | POA: Diagnosis present

## 2019-12-28 LAB — CBC
HCT: 32.3 % — ABNORMAL LOW (ref 36.0–46.0)
Hemoglobin: 10.8 g/dL — ABNORMAL LOW (ref 12.0–15.0)
MCH: 32 pg (ref 26.0–34.0)
MCHC: 33.4 g/dL (ref 30.0–36.0)
MCV: 95.6 fL (ref 80.0–100.0)
Platelets: 184 10*3/uL (ref 150–400)
RBC: 3.38 MIL/uL — ABNORMAL LOW (ref 3.87–5.11)
RDW: 13.1 % (ref 11.5–15.5)
WBC: 11.7 10*3/uL — ABNORMAL HIGH (ref 4.0–10.5)
nRBC: 0 % (ref 0.0–0.2)

## 2019-12-28 LAB — BASIC METABOLIC PANEL
Anion gap: 13 (ref 5–15)
BUN: 28 mg/dL — ABNORMAL HIGH (ref 8–23)
CO2: 20 mmol/L — ABNORMAL LOW (ref 22–32)
Calcium: 8.8 mg/dL — ABNORMAL LOW (ref 8.9–10.3)
Chloride: 106 mmol/L (ref 98–111)
Creatinine, Ser: 1.19 mg/dL — ABNORMAL HIGH (ref 0.44–1.00)
GFR calc Af Amer: 47 mL/min — ABNORMAL LOW (ref >60)
GFR calc non Af Amer: 40 mL/min — ABNORMAL LOW (ref >60)
Glucose, Bld: 177 mg/dL — ABNORMAL HIGH (ref 70–99)
Potassium: 3.8 mmol/L (ref 3.5–5.1)
Sodium: 139 mmol/L (ref 135–145)

## 2019-12-28 MED ORDER — LABETALOL HCL 5 MG/ML IV SOLN
10.0000 mg | INTRAVENOUS | Status: DC | PRN
  Administered 2019-12-28: 03:00:00 10 mg via INTRAVENOUS

## 2019-12-28 MED ORDER — PROPOFOL 10 MG/ML IV BOLUS
INTRAVENOUS | Status: DC | PRN
  Administered 2019-12-28: 90 mg via INTRAVENOUS

## 2019-12-28 MED ORDER — HEPARIN SODIUM (PORCINE) 1000 UNIT/ML IJ SOLN
INTRAMUSCULAR | Status: DC | PRN
  Administered 2019-12-28: 6000 [IU] via INTRAVENOUS

## 2019-12-28 MED ORDER — LOPERAMIDE HCL 2 MG PO CAPS: 2.0000 mg | ORAL_CAPSULE | ORAL | Status: DC | PRN

## 2019-12-28 MED ORDER — SODIUM CHLORIDE 0.9 % IV SOLN
INTRAVENOUS | Status: DC | PRN
  Administered 2019-12-28: 500 mL

## 2019-12-28 MED ORDER — CEFAZOLIN SODIUM-DEXTROSE 2-4 GM/100ML-% IV SOLN
2.0000 g | Freq: Three times a day (TID) | INTRAVENOUS | Status: AC
  Administered 2019-12-28 (×2): 2 g via INTRAVENOUS
  Filled 2019-12-28 (×2): qty 100

## 2019-12-28 MED ORDER — PROPOFOL 10 MG/ML IV BOLUS
INTRAVENOUS | Status: AC
  Filled 2019-12-28: qty 20

## 2019-12-28 MED ORDER — HEPARIN (PORCINE) 25000 UT/250ML-% IV SOLN: 400.0000 [IU]/h | INTRAVENOUS | Status: AC

## 2019-12-28 MED ORDER — SUGAMMADEX SODIUM 200 MG/2ML IV SOLN
INTRAVENOUS | Status: DC | PRN
  Administered 2019-12-28: 125 mg via INTRAVENOUS

## 2019-12-28 MED ORDER — ALBUMIN HUMAN 5 % IV SOLN: INTRAVENOUS | Status: DC | PRN

## 2019-12-28 MED ORDER — FENTANYL CITRATE (PF) 100 MCG/2ML IJ SOLN
INTRAMUSCULAR | Status: AC
  Filled 2019-12-28: qty 2

## 2019-12-28 MED ORDER — VALSARTAN-HYDROCHLOROTHIAZIDE 160-12.5 MG PO TABS: 0.5000 | ORAL_TABLET | Freq: Every day | ORAL | Status: DC

## 2019-12-28 MED ORDER — SODIUM CHLORIDE 0.9 % IV SOLN
INTRAVENOUS | Status: AC
  Filled 2019-12-28: qty 1.2

## 2019-12-28 MED ORDER — ALBUMIN HUMAN 5 % IV SOLN
INTRAVENOUS | Status: AC
  Filled 2019-12-28: qty 250

## 2019-12-28 MED ORDER — GABAPENTIN 300 MG PO CAPS
300.0000 mg | ORAL_CAPSULE | Freq: Three times a day (TID) | ORAL | Status: DC
  Administered 2019-12-28 – 2019-12-31 (×9): 300 mg via ORAL
  Filled 2019-12-28 (×9): qty 1

## 2019-12-28 MED ORDER — ACETAMINOPHEN 325 MG PO TABS
325.0000 mg | ORAL_TABLET | ORAL | Status: DC | PRN
  Administered 2019-12-28: 10:00:00 650 mg via ORAL
  Administered 2019-12-31: 14:00:00 325 mg via ORAL
  Filled 2019-12-28: qty 2
  Filled 2019-12-28: qty 1

## 2019-12-28 MED ORDER — POTASSIUM CHLORIDE CRYS ER 20 MEQ PO TBCR: 20.0000 meq | EXTENDED_RELEASE_TABLET | Freq: Every day | ORAL | Status: DC | PRN

## 2019-12-28 MED ORDER — DEXAMETHASONE SODIUM PHOSPHATE 10 MG/ML IJ SOLN
INTRAMUSCULAR | Status: DC | PRN
  Administered 2019-12-28: 5 mg via INTRAVENOUS

## 2019-12-28 MED ORDER — ONDANSETRON HCL 4 MG/2ML IJ SOLN: 4.0000 mg | Freq: Once | INTRAMUSCULAR | Status: DC | PRN

## 2019-12-28 MED ORDER — PHENYLEPHRINE 40 MCG/ML (10ML) SYRINGE FOR IV PUSH (FOR BLOOD PRESSURE SUPPORT)
PREFILLED_SYRINGE | INTRAVENOUS | Status: DC | PRN
  Administered 2019-12-28 (×2): 80 ug via INTRAVENOUS

## 2019-12-28 MED ORDER — DIAZEPAM 5 MG PO TABS: 2.5000 mg | ORAL_TABLET | Freq: Every day | ORAL | Status: DC | PRN

## 2019-12-28 MED ORDER — PHENYLEPHRINE HCL-NACL 10-0.9 MG/250ML-% IV SOLN
INTRAVENOUS | Status: DC | PRN
  Administered 2019-12-28: 25 ug/min via INTRAVENOUS

## 2019-12-28 MED ORDER — METOPROLOL TARTRATE 25 MG PO TABS
25.0000 mg | ORAL_TABLET | Freq: Every day | ORAL | Status: DC
  Administered 2019-12-28 – 2019-12-30 (×3): 25 mg via ORAL
  Filled 2019-12-28 (×3): qty 1

## 2019-12-28 MED ORDER — DOCUSATE SODIUM 100 MG PO CAPS
100.0000 mg | ORAL_CAPSULE | Freq: Every day | ORAL | Status: DC
  Administered 2019-12-29 – 2019-12-31 (×3): 100 mg via ORAL
  Filled 2019-12-28 (×3): qty 1

## 2019-12-28 MED ORDER — OXYCODONE HCL 5 MG PO TABS
5.0000 mg | ORAL_TABLET | ORAL | Status: DC | PRN
  Administered 2019-12-28 – 2019-12-29 (×5): 10 mg via ORAL
  Administered 2019-12-30 – 2019-12-31 (×3): 5 mg via ORAL
  Filled 2019-12-28 (×4): qty 2
  Filled 2019-12-28: qty 1
  Filled 2019-12-28: qty 2
  Filled 2019-12-28 (×2): qty 1

## 2019-12-28 MED ORDER — ONDANSETRON HCL 4 MG/2ML IJ SOLN
INTRAMUSCULAR | Status: DC | PRN
  Administered 2019-12-28: 4 mg via INTRAVENOUS

## 2019-12-28 MED ORDER — LABETALOL HCL 5 MG/ML IV SOLN: 10.0000 mg | INTRAVENOUS | Status: DC | PRN

## 2019-12-28 MED ORDER — SUCCINYLCHOLINE CHLORIDE 200 MG/10ML IV SOSY
PREFILLED_SYRINGE | INTRAVENOUS | Status: DC | PRN
  Administered 2019-12-28: 120 mg via INTRAVENOUS

## 2019-12-28 MED ORDER — ALBUMIN HUMAN 5 % IV SOLN
12.5000 g | Freq: Once | INTRAVENOUS | Status: AC
  Administered 2019-12-28: 05:00:00 12.5 g via INTRAVENOUS

## 2019-12-28 MED ORDER — PAPAVERINE HCL 30 MG/ML IJ SOLN
INTRAMUSCULAR | Status: AC
  Filled 2019-12-28: qty 2

## 2019-12-28 MED ORDER — SODIUM CHLORIDE 0.9 % IV SOLN: 500.0000 mL | Freq: Once | INTRAVENOUS | Status: DC | PRN

## 2019-12-28 MED ORDER — ALUM & MAG HYDROXIDE-SIMETH 200-200-20 MG/5ML PO SUSP: 15.0000 mL | ORAL | Status: DC | PRN

## 2019-12-28 MED ORDER — METOPROLOL TARTRATE 5 MG/5ML IV SOLN
2.0000 mg | INTRAVENOUS | Status: DC | PRN
  Administered 2019-12-29: 22:00:00 5 mg via INTRAVENOUS
  Filled 2019-12-28: qty 5

## 2019-12-28 MED ORDER — PHENOL 1.4 % MT LIQD: 1.0000 | OROMUCOSAL | Status: DC | PRN

## 2019-12-28 MED ORDER — HEPARIN (PORCINE) 25000 UT/250ML-% IV SOLN
400.0000 [IU]/h | INTRAVENOUS | Status: AC
  Administered 2019-12-28: 04:00:00 400 [IU]/h via INTRAVENOUS
  Filled 2019-12-28: qty 250

## 2019-12-28 MED ORDER — HYDROMORPHONE HCL 1 MG/ML IJ SOLN
0.5000 mg | INTRAMUSCULAR | Status: DC | PRN
  Administered 2019-12-28 – 2019-12-29 (×3): 0.5 mg via INTRAVENOUS
  Filled 2019-12-28 (×3): qty 1

## 2019-12-28 MED ORDER — SODIUM CHLORIDE (PF) 0.9 % IJ SOLN
INTRAVENOUS | Status: DC | PRN
  Administered 2019-12-28: 50 mL via INTRAMUSCULAR

## 2019-12-28 MED ORDER — 0.9 % SODIUM CHLORIDE (POUR BTL) OPTIME
TOPICAL | Status: DC | PRN
  Administered 2019-12-28: 2000 mL

## 2019-12-28 MED ORDER — FENTANYL CITRATE (PF) 250 MCG/5ML IJ SOLN
INTRAMUSCULAR | Status: AC
  Filled 2019-12-28: qty 5

## 2019-12-28 MED ORDER — GUAIFENESIN-DM 100-10 MG/5ML PO SYRP: 15.0000 mL | ORAL_SOLUTION | ORAL | Status: DC | PRN

## 2019-12-28 MED ORDER — HYDRALAZINE HCL 20 MG/ML IJ SOLN: 5.0000 mg | INTRAMUSCULAR | Status: DC | PRN

## 2019-12-28 MED ORDER — MAGNESIUM SULFATE 2 GM/50ML IV SOLN: 2.0000 g | Freq: Every day | INTRAVENOUS | Status: DC | PRN

## 2019-12-28 MED ORDER — PANTOPRAZOLE SODIUM 40 MG PO TBEC
40.0000 mg | DELAYED_RELEASE_TABLET | Freq: Every day | ORAL | Status: DC
  Administered 2019-12-28 – 2019-12-31 (×4): 40 mg via ORAL
  Filled 2019-12-28 (×4): qty 1

## 2019-12-28 MED ORDER — ESMOLOL HCL 100 MG/10ML IV SOLN
INTRAVENOUS | Status: DC | PRN
  Administered 2019-12-28 (×4): 10 mg via INTRAVENOUS

## 2019-12-28 MED ORDER — FENTANYL CITRATE (PF) 100 MCG/2ML IJ SOLN
25.0000 ug | INTRAMUSCULAR | Status: DC | PRN
  Administered 2019-12-28 (×2): 50 ug via INTRAVENOUS
  Administered 2019-12-28 (×2): 25 ug via INTRAVENOUS

## 2019-12-28 MED ORDER — HYDROCHLOROTHIAZIDE 10 MG/ML ORAL SUSPENSION
6.2500 mg | Freq: Every day | ORAL | Status: DC
  Administered 2019-12-28 – 2019-12-31 (×4): 6.25 mg via ORAL
  Filled 2019-12-28 (×6): qty 1.25

## 2019-12-28 MED ORDER — IRBESARTAN 150 MG PO TABS
75.0000 mg | ORAL_TABLET | Freq: Every day | ORAL | Status: DC
  Administered 2019-12-28 – 2019-12-31 (×4): 75 mg via ORAL
  Filled 2019-12-28 (×4): qty 1

## 2019-12-28 MED ORDER — LACTATED RINGERS IV SOLN: INTRAVENOUS | Status: DC | PRN

## 2019-12-28 MED ORDER — HEMOSTATIC AGENTS (NO CHARGE) OPTIME
TOPICAL | Status: DC | PRN
  Administered 2019-12-28: 1 via TOPICAL

## 2019-12-28 MED ORDER — ACETAMINOPHEN 325 MG RE SUPP: 325.0000 mg | RECTAL | Status: DC | PRN

## 2019-12-28 MED ORDER — FENTANYL CITRATE (PF) 250 MCG/5ML IJ SOLN
INTRAMUSCULAR | Status: DC | PRN
  Administered 2019-12-28 (×2): 50 ug via INTRAVENOUS
  Administered 2019-12-28 (×2): 25 ug via INTRAVENOUS

## 2019-12-28 MED ORDER — FENTANYL CITRATE (PF) 100 MCG/2ML IJ SOLN
50.0000 ug | Freq: Once | INTRAMUSCULAR | Status: AC
  Administered 2019-12-28: 05:00:00 50 ug via INTRAVENOUS

## 2019-12-28 MED ORDER — ROCURONIUM BROMIDE 10 MG/ML (PF) SYRINGE
PREFILLED_SYRINGE | INTRAVENOUS | Status: DC | PRN
  Administered 2019-12-28: 30 mg via INTRAVENOUS
  Administered 2019-12-28: 10 mg via INTRAVENOUS

## 2019-12-28 MED ORDER — LABETALOL HCL 5 MG/ML IV SOLN
INTRAVENOUS | Status: AC
  Filled 2019-12-28: qty 4

## 2019-12-28 MED ORDER — THROMBIN (RECOMBINANT) 20000 UNITS EX SOLR
CUTANEOUS | Status: AC
  Filled 2019-12-28: qty 20000

## 2019-12-28 MED ORDER — CLOPIDOGREL BISULFATE 75 MG PO TABS
75.0000 mg | ORAL_TABLET | Freq: Every day | ORAL | Status: DC
  Administered 2019-12-28 – 2019-12-31 (×4): 75 mg via ORAL
  Filled 2019-12-28 (×4): qty 1

## 2019-12-28 MED ORDER — FUROSEMIDE 20 MG PO TABS
20.0000 mg | ORAL_TABLET | ORAL | Status: DC
  Administered 2019-12-30: 09:00:00 20 mg via ORAL
  Filled 2019-12-28: qty 1

## 2019-12-28 MED ORDER — HEPARIN (PORCINE) 25000 UT/250ML-% IV SOLN
500.0000 [IU]/h | INTRAVENOUS | Status: DC
  Administered 2019-12-28: 05:00:00 500 [IU]/h via INTRAVENOUS
  Filled 2019-12-28: qty 250

## 2019-12-28 MED ORDER — POLYETHYLENE GLYCOL 3350 17 G PO PACK: 17.0000 g | PACK | Freq: Every day | ORAL | Status: DC | PRN

## 2019-12-28 MED ORDER — CEFAZOLIN SODIUM-DEXTROSE 2-3 GM-%(50ML) IV SOLR
INTRAVENOUS | Status: DC | PRN
  Administered 2019-12-28: 2 g via INTRAVENOUS

## 2019-12-28 MED ORDER — CHLORHEXIDINE GLUCONATE CLOTH 2 % EX PADS
6.0000 | MEDICATED_PAD | Freq: Every day | CUTANEOUS | Status: DC
  Administered 2019-12-31: 11:00:00 6 via TOPICAL

## 2019-12-28 MED ORDER — ONDANSETRON HCL 4 MG/2ML IJ SOLN: 4.0000 mg | Freq: Four times a day (QID) | INTRAMUSCULAR | Status: DC | PRN

## 2019-12-28 MED ORDER — SODIUM CHLORIDE 0.9 % IV SOLN: INTRAVENOUS | Status: AC

## 2019-12-28 NOTE — Transfer of Care (Signed)
Immediate Anesthesia Transfer of Care Note  Patient: [name]  Procedure(s) Performed: THROMBECTOMY LEFT FEMORAL ARTERY (Left ) Intraoperative Left Lower Extremity Angiogram (Left )  Patient Location: PACU  Anesthesia Type:General  Level of Consciousness: awake, alert  and oriented  Airway & Oxygen Therapy: Patient Spontanous Breathing and Patient connected to nasal cannula oxygen  Post-op Assessment: Report given to RN and Post -op Vital signs reviewed and stable  Post vital signs: Reviewed and stable  Last Vitals:  Vitals Value Taken Time  BP 181/75   Temp    Pulse 89 12/28/19 0259  Resp 13 12/28/19 0259  SpO2 99 % 12/28/19 0259  Vitals shown include unvalidated device data.  Last Pain:  Vitals:   12/27/19 2251  TempSrc:   PainSc: 5          Complications: No apparent anesthesia complications

## 2019-12-28 NOTE — Progress Notes (Signed)
DR. Scot Dock by bedside, Notified MD that pt had 220 cc bloody drainage from jp drain since came from PACU. Heparin drip stopped per MD order. Left groin dressing change done per dr. Scot Dock. Prn dilaudid administered for pain. Will continue to monitor.

## 2019-12-28 NOTE — Op Note (Signed)
NAME: [name]    MRN: [id number] DOB: 1927-12-12    DATE OF OPERATION: 12/28/2019  PREOP DIAGNOSIS:    Ischemic left lower extremity secondary to acute arterial occlusion  POSTOP DIAGNOSIS:    Same  PROCEDURE:    1.  Redo left femoral artery exposure 2.  Thrombectomy of left deep femoral artery to anterior tibial artery bypass 3.  Intraoperative arteriogram  SURGEON: Judeth Cornfield. Scot Dock, MD  ASSIST: Arlee Muslim  ANESTHESIA: General  EBL: 200 cc  INDICATIONS:    [name] is a 86 y.o. female who is undergone multiple previous left lower extremity revascularization attempts.  Most recently she had a left deep femoral artery to anterior tibial artery bypass with a PTFE graft.  She awoke at 3 AM this morning (24 hours ago) with numbness and coolness of the left lower extremity.  She ultimately presented to the emergency department was found to have a profoundly ischemic left foot with no Doppler signals in the left foot.  She was taken urgently to the operating for thrombectomy as her only chance for limb salvage.  FINDINGS:   The Fogarty catheter passed the entire length.  This was a 3 Fogarty catheter.  Completion arteriogram showed a very small anterior tibial artery.  This may partially be secondary to spasm.  There was also potentially some extravasation.  The proximal anastomosis took a 3.5 mm dilator.  This was anastomosed to the deep femoral artery.  TECHNIQUE:   The patient was taken to the operating room and received a general anesthetic.  The left lower extremity and left groin and lower abdomen were prepped and draped in usual sterile fashion.  Incision in the left groin was opened.  Through dense scar tissue I dissected free the proximal femoral to anterior tibial artery bypass graft up to where it was anastomosed to the deep femoral artery.  The patient was heparinized.  The graft was divided transversely.  A #3 Fogarty catheter was used to  thrombectomize the graft and a large amount of clot was retrieved.  There was also some pseudointima retrieved from the graft.  Once no further clot was retrieved from the graft I passed a 3 Fogarty catheter down the entire length of the anterior tibial artery multiple times till no further clot was retrieved.  At this point there was reasonable backbleeding.  This was flushed with heparinized saline and clamped.  Attention was then turned to the proximal anastomosis.  I passed the 3 Fogarty catheter and retrieved clot proximally.  There was good inflow.  I then passed it up to a 3-1/2 mm dilator through the anastomosis which was patent.  The graft was then sewn back into end with continuous 6-0 Prolene suture.  At the completion there was an anterior tibial signal with a Doppler.  An intraoperative arteriogram was then obtained by cannulating the proximal graft.  This showed a very small anterior tibial artery, this may be partially secondary to spasm.  There was also a suggestion of some extravasation.  However, the patient had an anterior tibial signal with a Doppler and no significant swelling in the leg and regardless I did not think there was any further options distally given the very small size of the anterior tibial artery.  Hemostasis was obtained in the wound.  The heparin was not reversed with protamine.  The wound was then closed with a deep layer of 2-0 Vicryl, a subcutaneous layer with 3-0 Vicryl and the skin  closed with a 4-0 Vicryl.  Dermabond was applied.  Of note I did place a 19 Blake drain.  The patient will be placed on low-dose heparin.  Patient tolerated procedure well was transferred to recovery in stable condition.  All needle and sponge counts were correct.  Deitra Mayo, MD, FACS [facility]  DATE OF DICTATION:   12/28/2019

## 2019-12-28 NOTE — Anesthesia Postprocedure Evaluation (Signed)
Anesthesia Post Note  Patient: TYNLEIGH AGUSTIN  Procedure(s) Performed: Redo left femoral artery exposure, Thrombectomy of left deep femoral artery to anterior tibial artery bypass (Left ) Intraoperative Left Lower Extremity Angiogram (Left )     Patient location during evaluation: PACU Anesthesia Type: General Level of consciousness: awake and alert Pain management: satisfactory to patient Vital Signs Assessment: post-procedure vital signs reviewed and stable Respiratory status: spontaneous breathing, nonlabored ventilation, respiratory function stable and patient connected to nasal cannula oxygen Cardiovascular status: blood pressure returned to baseline and stable Postop Assessment: no apparent nausea or vomiting Anesthetic complications: no    Last Vitals:  Vitals:   12/28/19 1204 12/28/19 1539  BP: (!) 139/55 (!) 148/56  Pulse: 82 81  Resp: (!) 23 12  Temp: 36.4 C 36.7 C  SpO2: 94% 95%    Last Pain:  Vitals:   12/28/19 1539  TempSrc: Axillary  PainSc:                  Jerid Catherman COKER

## 2019-12-28 NOTE — Progress Notes (Signed)
[place] for Heparin (MD dosing)  Indication: ischemic LLE s/p thrombectomy  Allergies  Allergen Reactions  . Motrin [Ibuprofen] Other (See Comments)    ADVERSE REACTION - GI BLEED  . Statins Other (See Comments)    ADVERSE REACTION MUSCLE PAIN & WEAKNESS  . Eliquis [Apixaban] Other (See Comments)    Lower GI bleeding  . Morphine And Related Other (See Comments)    HALLUCINATIONS REACTION IS SIDE EFFECT  . Promethazine Hcl Other (See Comments)    IV  Drug only, makes her act crazy  . Sulfa Antibiotics Nausea And Vomiting    Patient Measurements: Height: 5\' 2"  (157.5 cm) Weight: 132 lb 11.5 oz (60.2 kg) IBW/kg (Calculated) : 50.1  Ht: 60 in Wt ~64 kg   Vital Signs: Temp: 97.8 F (36.6 C) (02/10 0856) Temp Source: Oral (02/10 0856) BP: 146/64 (02/10 0856) Pulse Rate: 90 (02/10 0856)  Labs: Recent Labs    12/27/19 1835  HGB 13.2  HCT 41.1  PLT 238  CREATININE 1.50*    Estimated Creatinine Clearance: 21.7 mL/min (A) (by C-G formula based on SCr of 1.5 mg/dL (H)).   Medical History: Past Medical History:  Diagnosis Date  . Anemia   . Anginal pain ([place])   . Arthritis    "qwhere" (09/05/2016)  . Atrial fibrillation ([place]) 09/2016  . Chronic lower back pain   . Complication of anesthesia    "takes a long time for it to wear off; I can hallucinate if I take too much" (09/05/2016)  . DVT (deep venous thrombosis) ([place]) 10/2009  . Fall from steps 08/31/2013   Fx. pelvis, Left Hip, Left Elbow  . Fibromyalgia   . GERD (gastroesophageal reflux disease)    09/22/16- "no too much anymore"  . GI bleed 10/24/2016  . High cholesterol   . History of blood transfusion   . History of hiatal hernia   . Hypertension   . Macular degeneration, wet ([place])    "started in right eye; now legally blind in that eye; now started in left eye but pretty much in control" (09/05/2016)  . Osteoporosis   . Peripheral vascular disease ([place])    nonviable tissue Right foot  . PONV (postoperative nausea and vomiting)   . Squamous cell carcinoma of skin of right calf Aug. 2015  . Stroke [facility])    TIA's no residual  . TIA (transient ischemic attack)    "several at once; none in a long time" (09/05/2016)    Medications:  Medications Prior to Admission  Medication Sig Dispense Refill Last Dose  . acetaminophen (TYLENOL) 500 MG tablet Take 1,000 mg by mouth 2 (two) times daily as needed for moderate pain or headache. Patient took this medication for her pain.   Past Week at Unknown time  . Cholecalciferol (VITAMIN D3) 400 units tablet Take 400 Units by mouth at bedtime.    12/26/2019 at Unknown time  . clopidogrel (PLAVIX) 75 MG tablet Take 1 tablet (75 mg total) by mouth daily. 30 tablet 1 12/27/2019 at Unknown time  . Cyanocobalamin (VITAMIN B-12) 5000 MCG TBDP Take 5,000 mcg by mouth daily.    12/27/2019 at Unknown time  . diazepam (VALIUM) 5 MG tablet Take 0.5 tablets (2.5 mg total) by mouth at bedtime as needed for anxiety. (Patient taking differently: Take 2.5-5 mg by mouth daily as needed for anxiety. ) 20 tablet 0 Past Week at Unknown time  . furosemide (LASIX) 20 MG tablet Take 20 mg  by mouth every Monday, Wednesday, and Friday.   12/26/2019 at Unknown time  . gabapentin (NEURONTIN) 300 MG capsule Take 300 mg by mouth 3 (three) times daily.    12/27/2019 at Unknown time  . loperamide (IMODIUM) 2 MG capsule Take 2 mg by mouth as needed for diarrhea or loose stools.   Past Month at Unknown time  . metoprolol tartrate (LOPRESSOR) 25 MG tablet Take 1 tablet (25 mg total) by mouth at bedtime. 30 tablet 1 12/26/2019 at 2200  . Multiple Vitamins-Minerals (EYE VITAMINS PO) Take 1 tablet by mouth at bedtime.    12/26/2019 at Unknown time  . oxycodone (OXY-IR) 5 MG capsule Take 5-10 mg by mouth every 4 (four) hours as needed for pain.   Past Week at Unknown time  . potassium chloride (K-DUR,KLOR-CON) 10 MEQ tablet Take 10 mEq by mouth daily.   12/27/2019 at  Unknown time  . valsartan-hydrochlorothiazide (DIOVAN-HCT) 160-12.5 MG tablet Take 0.5 tablets by mouth daily. 30 tablet 0 12/27/2019 at Unknown time    Assessment: 86 y.o. F presents with LLE ischemia, noted h/o multiple procedures in L leg including a femoral bypass with a prosthetic graft in 2018. She is s/p thrombectomy on 2/9 and off heparin due to bloody drainage from the JP.  Possible long term anticoagulation based on patient progress.   Goal of Therapy:  Heparin level 0.3-0.7 units/ml Monitor platelets by anticoagulation protocol: Yes   Plan:  -Heparin stopped per MD -Will follow plans for anticoagulation  Hildred Laser, PharmD Clinical Pharmacist **Pharmacist phone directory can now be found on [email or web address] ([email or web address]).  Listed under [place].

## 2019-12-28 NOTE — Progress Notes (Signed)
ANTICOAGULATION CONSULT NOTE - Initial Consult  Pharmacy Consult for Heparin (MD dosing)  Indication: ischemic LLE s/p thrombectomy  Allergies  Allergen Reactions  . Motrin [Ibuprofen] Other (See Comments)    ADVERSE REACTION - GI BLEED  . Statins Other (See Comments)    ADVERSE REACTION MUSCLE PAIN & WEAKNESS  . Eliquis [Apixaban] Other (See Comments)    Lower GI bleeding  . Morphine And Related Other (See Comments)    HALLUCINATIONS REACTION IS SIDE EFFECT  . Oxycontin [Oxycodone Hcl] Other (See Comments)    [REACTION IS SIDE EFFECT]  > HALLUCINATIONS  . Promethazine Hcl Other (See Comments)    IV  Drug only, makes her act crazy  . Sulfa Antibiotics Nausea And Vomiting    Patient Measurements:    Ht: 60 in Wt ~64 kg   Vital Signs: Temp: 97.2 F (36.2 C) (02/10 0257) Temp Source: Oral (02/09 2014) BP: 181/75 (02/10 0257) Pulse Rate: 90 (02/10 0257)  Labs: Recent Labs    12/27/19 1835  HGB 13.2  HCT 41.1  PLT 238  CREATININE 1.50*    CrCl cannot be calculated (Unknown ideal weight.).   Medical History: Past Medical History:  Diagnosis Date  . Anemia   . Anginal pain ([place])   . Arthritis    "qwhere" (09/05/2016)  . Atrial fibrillation ([place]) 09/2016  . Chronic lower back pain   . Complication of anesthesia    "takes a long time for it to wear off; I can hallucinate if I take too much" (09/05/2016)  . DVT (deep venous thrombosis) ([place]) 10/2009  . Fall from steps 08/31/2013   Fx. pelvis, Left Hip, Left Elbow  . Fibromyalgia   . GERD (gastroesophageal reflux disease)    09/22/16- "no too much anymore"  . GI bleed 10/24/2016  . High cholesterol   . History of blood transfusion   . History of hiatal hernia   . Hypertension   . Macular degeneration, wet ([place])    "started in right eye; now legally blind in that eye; now started in left eye but pretty much in control" (09/05/2016)  . Osteoporosis   . Peripheral vascular disease ([place])    nonviable tissue  Right foot  . PONV (postoperative nausea and vomiting)   . Squamous cell carcinoma of skin of right calf Aug. 2015  . Stroke [facility])    TIA's no residual  . TIA (transient ischemic attack)    "several at once; none in a long time" (09/05/2016)    Medications:  Medications Prior to Admission  Medication Sig Dispense Refill Last Dose  . acetaminophen (TYLENOL) 500 MG tablet Take 1,000 mg by mouth 2 (two) times daily as needed for moderate pain or headache. Patient took this medication for her pain.   Past Week at Unknown time  . Cholecalciferol (VITAMIN D3) 400 units tablet Take 400 Units by mouth at bedtime.    12/26/2019 at Unknown time  . clopidogrel (PLAVIX) 75 MG tablet Take 1 tablet (75 mg total) by mouth daily. 30 tablet 1 12/27/2019 at Unknown time  . Cyanocobalamin (VITAMIN B-12) 5000 MCG TBDP Take 5,000 mcg by mouth daily.    12/27/2019 at Unknown time  . diazepam (VALIUM) 5 MG tablet Take 0.5 tablets (2.5 mg total) by mouth at bedtime as needed for anxiety. (Patient taking differently: Take 2.5-5 mg by mouth daily as needed for anxiety. ) 20 tablet 0 Past Week at Unknown time  . furosemide (LASIX) 20 MG tablet Take 20 mg by  mouth every Monday, Wednesday, and Friday.   12/26/2019 at Unknown time  . gabapentin (NEURONTIN) 300 MG capsule Take 300 mg by mouth 3 (three) times daily.    12/27/2019 at Unknown time  . loperamide (IMODIUM) 2 MG capsule Take 2 mg by mouth as needed for diarrhea or loose stools.   Past Month at Unknown time  . metoprolol tartrate (LOPRESSOR) 25 MG tablet Take 1 tablet (25 mg total) by mouth at bedtime. 30 tablet 1 12/26/2019 at 2200  . Multiple Vitamins-Minerals (EYE VITAMINS PO) Take 1 tablet by mouth at bedtime.    12/26/2019 at Unknown time  . oxycodone (OXY-IR) 5 MG capsule Take 5-10 mg by mouth every 4 (four) hours as needed for pain.   Past Week at Unknown time  . potassium chloride (K-DUR,KLOR-CON) 10 MEQ tablet Take 10 mEq by mouth daily.   12/27/2019 at Unknown time  .  valsartan-hydrochlorothiazide (DIOVAN-HCT) 160-12.5 MG tablet Take 0.5 tablets by mouth daily. 30 tablet 0 12/27/2019 at Unknown time    Assessment: 86 y.o. F presents with LLE ischemia, noted h/o multiple procedures in L leg. S/p thrombectomy/bypass 2/10. Vascular surgery ordered heparin to continue at 400 units/hr and then to increase to 500 units/hr at 0430.  Goal of Therapy:  Heparin level 0.3-0.7 units/ml Monitor platelets by anticoagulation protocol: Yes   Plan:  Heparin to continue at 400 units/hr and then to increase to 500 units/hr at 0430 (per MD orders) Will f/u heparin level to verify not elevated Will f/u with vascular 2/10 during day shift to see if/when they want pharmacy to start adjusting heparin  Sherlon Handing, PharmD, BCPS Please see amion for complete clinical pharmacist phone list 12/28/2019,3:15 AM

## 2019-12-28 NOTE — Anesthesia Procedure Notes (Signed)
Procedure Name: Intubation Date/Time: 12/28/2019 1:03 AM Performed by: Jearld Pies, CRNA Pre-anesthesia Checklist: Patient identified, Emergency Drugs available, Suction available and Patient being monitored Patient Re-evaluated:Patient Re-evaluated prior to induction Oxygen Delivery Method: Circle System Utilized Preoxygenation: Pre-oxygenation with 100% oxygen Induction Type: IV induction, Rapid sequence and Cricoid Pressure applied Laryngoscope Size: Miller and 2 Grade View: Grade I Tube type: Oral Tube size: 7.0 mm Number of attempts: 1 Airway Equipment and Method: Stylet Placement Confirmation: ETT inserted through vocal cords under direct vision,  positive ETCO2 and breath sounds checked- equal and bilateral Secured at: 21 cm Tube secured with: Tape Dental Injury: Teeth and Oropharynx as per pre-operative assessment

## 2019-12-28 NOTE — Plan of Care (Signed)
Poc progressing.  

## 2019-12-28 NOTE — Progress Notes (Signed)
Pt received from PACU via bed. Pt ao x4. Breathing even and unlabored in RA. Vitals stable. CHG bath completed,connected to tele and CCMD notified. Left leg PT and DP pulses has doppler signal. JP drain to left groin draining moderate amount of bright red blood drainage. Heparin running at 15ml/hr, NS @ 64ml/hr. Oriented pt to room and call bell system. [name] by bedside. Call bell within reach. Will continue to monitor.

## 2019-12-28 NOTE — Progress Notes (Signed)
Inpatient Rehabilitation Admissions Coordinator  Recommend PT and OT evals when appropriate.  Danne Baxter, RN, MSN Rehab Admissions Coordinator [phone] 12/28/2019 3:01 PM

## 2019-12-28 NOTE — Anesthesia Preprocedure Evaluation (Addendum)
Anesthesia Evaluation  Patient identified by MRN, date of birth, ID band Patient awake    Reviewed: Allergy & Precautions, NPO status , Patient's Chart, lab work & pertinent test results  Airway Mallampati: II  TM Distance: >3 FB Neck ROM: Full    Dental  (+) Edentulous Upper, Dental Advisory Given   Pulmonary former smoker,    breath sounds clear to auscultation       Cardiovascular hypertension,  Rhythm:Regular Rate:Normal     Neuro/Psych    GI/Hepatic   Endo/Other    Renal/GU      Musculoskeletal   Abdominal   Peds  Hematology   Anesthesia Other Findings S/P R. BKA  Reproductive/Obstetrics                             Anesthesia Physical Anesthesia Plan  ASA: III  Anesthesia Plan: General   Post-op Pain Management:    Induction: Intravenous  PONV Risk Score and Plan: Ondansetron and Dexamethasone  Airway Management Planned: Oral ETT  Additional Equipment:   Intra-op Plan:   Post-operative Plan: Extubation in OR  Informed Consent: I have reviewed the patients History and Physical, chart, labs and discussed the procedure including the risks, benefits and alternatives for the proposed anesthesia with the patient or authorized representative who has indicated his/her understanding and acceptance.     Dental advisory given  Plan Discussed with: CRNA and Anesthesiologist  Anesthesia Plan Comments:         Anesthesia Quick Evaluation

## 2019-12-28 NOTE — Progress Notes (Signed)
   VASCULAR SURGERY ASSESSMENT & PLAN:   POD 1 -THROMBECTOMY LEFT DEEP FEMORAL ARTERY TO ANTERIOR TIBIAL ARTERY BYPASS (PTFE): The bypass graft is patent with brisk Doppler signals in the anterior tibial and dorsalis pedis positions.  The calf is soft.  JP: She has had 220 cc of drainage from the [place].  For now I have stopped her heparin.  Hopefully this will stop any oozing.  I would like to consider long-term anticoagulation given that she has disadvantaged outflow and a prosthetic tibial bypass.  SUBJECTIVE:   Complains of pain in the left foot.  Given that she has excellent blood flow I suspect this is nerve related given that her foot was ischemic for 24 hours.  PHYSICAL EXAM:   Vitals:   12/28/19 0442 12/28/19 0457 12/28/19 0512 12/28/19 0605  BP: (!) 159/70 (!) 178/56 (!) 177/68 (!) 155/69  Pulse: 81 82 77 72  Resp: 14 10 13 15   Temp:   97.6 F (36.4 C) 97.6 F (36.4 C)  TempSrc:    Oral  SpO2: 99% 100% 100% 100%  Weight:    60.2 kg  Height:    5\' 2"  (1.575 m)   The groin incision on the left has no significant swelling.  She does have moderate drainage from her JP is described above.  There is a small amount of drainage around the JP exit site.  LABS:   Lab Results  Component Value Date   WBC 8.5 12/27/2019   HGB 13.2 12/27/2019   HCT 41.1 12/27/2019   MCV 97.4 12/27/2019   PLT 238 12/27/2019   Lab Results  Component Value Date   CREATININE 1.50 (H) 12/27/2019   Lab Results  Component Value Date   INR 0.98 07/08/2018    PROBLEM LIST:    Active Problems:   Critical lower limb ischemia   CURRENT MEDS:   . clopidogrel  75 mg Oral Daily  . [START ON 12/29/2019] docusate sodium  100 mg Oral Daily  . fentaNYL      . fentaNYL      . [START ON 12/30/2019] furosemide  20 mg Oral Q M,W,F  . gabapentin  300 mg Oral TID  . irbesartan  75 mg Oral Daily   And  . hydrochlorothiazide  6.25 mg Oral Daily  . labetalol      . metoprolol tartrate  25 mg Oral QHS  .  pantoprazole  40 mg Oral Daily    Deitra Mayo Office: [phone] 12/28/2019

## 2019-12-29 ENCOUNTER — Inpatient Hospital Stay (HOSPITAL_COMMUNITY): Payer: Medicare Other

## 2019-12-29 DIAGNOSIS — I739 Peripheral vascular disease, unspecified: Secondary | ICD-10-CM

## 2019-12-29 LAB — CBC
HCT: 26.5 % — ABNORMAL LOW (ref 36.0–46.0)
Hemoglobin: 8.4 g/dL — ABNORMAL LOW (ref 12.0–15.0)
MCH: 31.3 pg (ref 26.0–34.0)
MCHC: 31.7 g/dL (ref 30.0–36.0)
MCV: 98.9 fL (ref 80.0–100.0)
Platelets: 203 10*3/uL (ref 150–400)
RBC: 2.68 MIL/uL — ABNORMAL LOW (ref 3.87–5.11)
RDW: 13.6 % (ref 11.5–15.5)
WBC: 11.2 10*3/uL — ABNORMAL HIGH (ref 4.0–10.5)
nRBC: 0 % (ref 0.0–0.2)

## 2019-12-29 LAB — BASIC METABOLIC PANEL
Anion gap: 10 (ref 5–15)
BUN: 24 mg/dL — ABNORMAL HIGH (ref 8–23)
CO2: 23 mmol/L (ref 22–32)
Calcium: 8.7 mg/dL — ABNORMAL LOW (ref 8.9–10.3)
Chloride: 105 mmol/L (ref 98–111)
Creatinine, Ser: 1.18 mg/dL — ABNORMAL HIGH (ref 0.44–1.00)
GFR calc Af Amer: 47 mL/min — ABNORMAL LOW (ref >60)
GFR calc non Af Amer: 41 mL/min — ABNORMAL LOW (ref >60)
Glucose, Bld: 122 mg/dL — ABNORMAL HIGH (ref 70–99)
Potassium: 3.9 mmol/L (ref 3.5–5.1)
Sodium: 138 mmol/L (ref 135–145)

## 2019-12-29 MED ORDER — HEPARIN SODIUM (PORCINE) 5000 UNIT/ML IJ SOLN
5000.0000 [IU] | Freq: Three times a day (TID) | INTRAMUSCULAR | Status: DC
  Administered 2019-12-29 – 2019-12-31 (×7): 5000 [IU] via SUBCUTANEOUS
  Filled 2019-12-29 (×7): qty 1

## 2019-12-29 NOTE — Progress Notes (Signed)
   VASCULAR SURGERY ASSESSMENT & PLAN:   POD  -THROMBECTOMY LEFT DEEP FEMORAL ARTERY TO ANTERIOR TIBIAL ARTERY BYPASS (PTFE): The bypass graft is patent with brisk Doppler signals in the anterior tibial and dorsalis pedis positions.  The calf is soft.  Her left foot is hyperemic.  JP: Her JP drainage has slowed down.  It was 60 cc and 10 cc in the last 2 shifts.  There is some drainage around the exit site.  Discontinue JP later today if no increase in drainage.  ANTICOAGULATION: She is on Plavix.  I thought I had ordered 400 units an hour of heparin IV last night but I do not see this order and she is not receiving heparin.  Will start subcu heparin for DVT prophylaxis.  All things considered given her age and amputation I think she would be at increased risk for Xarelto so we will not place her on an oral anticoagulation.  She apparently has had problems with Coumadin in the past and is also allergic to Eliquis.  Consult physical therapy and Occupational Therapy as per CIR's recommendation.  SUBJECTIVE:   No specific complaints.  PHYSICAL EXAM:   Vitals:   12/28/19 2059 12/28/19 2322 12/29/19 0414 12/29/19 0822  BP: (!) 129/57 119/63 (!) 122/55 (!) 144/77  Pulse: 90 84 85 83  Resp: 14 17 14 15   Temp: 98.5 F (36.9 C) 98.2 F (36.8 C) 98.2 F (36.8 C) 98.6 F (37 C)  TempSrc: Oral Oral Oral Oral  SpO2: 94% 94% 94% 91%  Weight:      Height:       Her left foot is hyperemic. Brisk Doppler signals in the left foot.  The left dorsalis pedis signal is very strong. The left calf is soft. The left groin incision looks fine.  LABS:   Lab Results  Component Value Date   WBC 11.2 (H) 12/29/2019   HGB 8.4 (L) 12/29/2019   HCT 26.5 (L) 12/29/2019   MCV 98.9 12/29/2019   PLT 203 12/29/2019   Lab Results  Component Value Date   CREATININE 1.18 (H) 12/29/2019   Lab Results  Component Value Date   INR 0.98 07/08/2018   CBG (last 3)  No results for input(s): GLUCAP in the  last 72 hours.  PROBLEM LIST:    Active Problems:   Critical lower limb ischemia   CURRENT MEDS:   . Chlorhexidine Gluconate Cloth  6 each Topical Daily  . clopidogrel  75 mg Oral Daily  . docusate sodium  100 mg Oral Daily  . [START ON 12/30/2019] furosemide  20 mg Oral Q M,W,F  . gabapentin  300 mg Oral TID  . irbesartan  75 mg Oral Daily   And  . hydrochlorothiazide  6.25 mg Oral Daily  . metoprolol tartrate  25 mg Oral QHS  . pantoprazole  40 mg Oral Daily    Deitra Mayo Office: [phone] 12/29/2019

## 2019-12-29 NOTE — Evaluation (Addendum)
Physical Therapy Evaluation Patient Details Name: [name] MRN: [id number] DOB: 1928/06/24 Today's Date: 12/29/2019   History of Present Illness  Pt is a 86 y.o. F with significant PMH of PVD right BKA 12/2016, L elbow fx, L ankle fx, L acetabular fx, and multiple revascularization procedures of LLE who presents with limb threatening iscehmia and is s/p LLE thrombectomy of her graft 2/10.  Clinical Impression  Prior to admission, pt lives with her daughter, requires assist with some ADL's, and transfers via a slide board. She is modified independent with wheelchair propulsion. Pt has received a referral for a power wheelchair due to painful bilateral shoulders. On PT evaluation, pt presents close to her functional baseline. Limited by LLE pain. Requiring significant assist for bed mobility, but typically sleeps in a recliner. Able to dangle edge of bed for ~15 minutes and perform exercises with LLE. Pt daughter plans to bring pt wheelchair tomorrow to trial transfer. Pt daughter demonstrates correct technique for mobilizing the pt and able to provide necessary assist upon discharge home. Do not anticipate need for PT follow up as pt and pt daughter are satisfied with pt current level of function, routine, and home set up.    Follow Up Recommendations Supervision/Assistance - 24 hour;No PT follow up    Equipment Recommendations  None recommended by PT    Recommendations for Other Services       Precautions / Restrictions Precautions Precautions: Fall;Other (comment) Precaution Comments: JP drain, R BKA Restrictions Weight Bearing Restrictions: No      Mobility  Bed Mobility Overal bed mobility: Needs Assistance Bed Mobility: Supine to Sit;Sit to Supine;Rolling Rolling: Max assist   Supine to sit: Max assist Sit to supine: Max assist   General bed mobility comments: maxA with use of bed pad for supine <> sit; pt limited by pain. cues for use of bed rail  Transfers                     Ambulation/Gait                Stairs            Wheelchair Mobility    Modified Rankin (Stroke Patients Only)       Balance Overall balance assessment: Needs assistance Sitting-balance support: Feet unsupported Sitting balance-Leahy Scale: Fair                                       Pertinent Vitals/Pain Pain Assessment: Faces Faces Pain Scale: Hurts even more Pain Location: LLE Pain Descriptors / Indicators: Grimacing;Guarding Pain Intervention(s): Limited activity within patient's tolerance;Monitored during session;Premedicated before session;Repositioned    Home Living Family/patient expects to be discharged to:: Private residence Living Arrangements: Children(daughter) Available Help at Discharge: Family;Available 24 hours/day Type of Home: House Home Access: Ramped entrance     Home Layout: One level Home Equipment: Wheelchair - manual;Other (comment);Bedside commode;Shower seat(slide board)      Prior Function Level of Independence: Needs assistance   Gait / Transfers Assistance Needed: ModI with slide board transfers; requires assist for shower transfer; modI with wheelchair propulsion  ADL's / Homemaking Assistance Needed: requires assist with some ADL's  Comments: Sleeps in a recliner at home     Hand Dominance        Extremity/Trunk Assessment   Upper Extremity Assessment Upper Extremity Assessment: Generalized weakness    Lower Extremity  Assessment Lower Extremity Assessment: RLE deficits/detail;LLE deficits/detail RLE Deficits / Details: BKA; ROM WFL LLE Deficits / Details: Decreased ankle ROM at baseline, able to wiggle toes, minimal LAQ    Cervical / Trunk Assessment Cervical / Trunk Assessment: Kyphotic  Communication   Communication: No difficulties  Cognition Arousal/Alertness: Awake/alert Behavior During Therapy: WFL for tasks assessed/performed Overall Cognitive Status: Within  Functional Limits for tasks assessed                                        General Comments      Exercises General Exercises - Lower Extremity Long Arc Quad: Left;10 reps;Seated   Assessment/Plan    PT Assessment Patient needs continued PT services  PT Problem List Decreased strength;Decreased range of motion;Decreased activity tolerance;Decreased mobility;Decreased balance;Impaired sensation;Pain       PT Treatment Interventions DME instruction;Functional mobility training;Therapeutic activities;Therapeutic exercise;Balance training;Patient/family education;Wheelchair mobility training    PT Goals (Current goals can be found in the Care Plan section)  Acute Rehab PT Goals Patient Stated Goal: "keep my left leg." PT Goal Formulation: With patient/family Time For Goal Achievement: 01/12/20 Potential to Achieve Goals: Fair    Frequency Min 3X/week   Barriers to discharge        Co-evaluation               AM-PAC PT "6 Clicks" Mobility  Outcome Measure Help needed turning from your back to your side while in a flat bed without using bedrails?: Total Help needed moving from lying on your back to sitting on the side of a flat bed without using bedrails?: Total Help needed moving to and from a bed to a chair (including a wheelchair)?: Total Help needed standing up from a chair using your arms (e.g., wheelchair or bedside chair)?: Total Help needed to walk in hospital room?: Total Help needed climbing 3-5 steps with a railing? : Total 6 Click Score: 6    End of Session   Activity Tolerance: Patient limited by pain Patient left: in bed;with call bell/phone within reach;with family/visitor present Nurse Communication: Mobility status PT Visit Diagnosis: Pain;Other abnormalities of gait and mobility (R26.89) Pain - Right/Left: Left Pain - part of body: Leg    Time: [id number] PT Time Calculation (min) (ACUTE ONLY): 33 min   Charges:   PT  Evaluation $PT Eval Moderate Complexity: 1 Mod PT Treatments $Therapeutic Activity: 8-22 mins          Wyona Almas, PT, DPT Acute Rehabilitation Services Pager [phone] Office [phone]   [name] 12/29/2019, 2:09 PM

## 2019-12-29 NOTE — Progress Notes (Signed)
ABI exam performed.  Preliminary results can be found under CV proc under chart review.  12/29/2019 1:32 PM  Binta Statzer, K., RDMS, RVT

## 2019-12-29 NOTE — Plan of Care (Signed)
  Problem: Education: Goal: Knowledge of General Education information will improve Description Including pain rating scale, medication(s)/side effects and non-pharmacologic comfort measures Outcome: Progressing   Problem: Health Behavior/Discharge Planning: Goal: Ability to manage health-related needs will improve Outcome: Progressing   

## 2019-12-29 NOTE — Progress Notes (Signed)
Pt foley removed this am and has not voided. Bladder scan showed 353 mL of urine in bladder. Pt reports urge to pee, but isn't able to. Pt reports feeling uncomfortable and is tearful. In and out cath relieved 578mL of urine.

## 2019-12-30 ENCOUNTER — Inpatient Hospital Stay (HOSPITAL_COMMUNITY): Payer: Medicare Other

## 2019-12-30 DIAGNOSIS — Z79899 Other long term (current) drug therapy: Secondary | ICD-10-CM

## 2019-12-30 DIAGNOSIS — I480 Paroxysmal atrial fibrillation: Secondary | ICD-10-CM

## 2019-12-30 DIAGNOSIS — Z8719 Personal history of other diseases of the digestive system: Secondary | ICD-10-CM

## 2019-12-30 DIAGNOSIS — I340 Nonrheumatic mitral (valve) insufficiency: Secondary | ICD-10-CM

## 2019-12-30 DIAGNOSIS — I2510 Atherosclerotic heart disease of native coronary artery without angina pectoris: Secondary | ICD-10-CM

## 2019-12-30 DIAGNOSIS — I998 Other disorder of circulatory system: Secondary | ICD-10-CM

## 2019-12-30 DIAGNOSIS — R0600 Dyspnea, unspecified: Secondary | ICD-10-CM

## 2019-12-30 LAB — URINALYSIS, COMPLETE (UACMP) WITH MICROSCOPIC
Bilirubin Urine: NEGATIVE
Glucose, UA: NEGATIVE mg/dL
Ketones, ur: NEGATIVE mg/dL
Nitrite: NEGATIVE
Protein, ur: NEGATIVE mg/dL
RBC / HPF: >50 RBC/hpf — ABNORMAL HIGH (ref 0–5)
Specific Gravity, Urine: 1.006 (ref 1.005–1.030)
pH: 5 (ref 5.0–8.0)

## 2019-12-30 LAB — BASIC METABOLIC PANEL
Anion gap: 8 (ref 5–15)
BUN: 21 mg/dL (ref 8–23)
CO2: 21 mmol/L — ABNORMAL LOW (ref 22–32)
Calcium: 8.5 mg/dL — ABNORMAL LOW (ref 8.9–10.3)
Chloride: 106 mmol/L (ref 98–111)
Creatinine, Ser: 1.2 mg/dL — ABNORMAL HIGH (ref 0.44–1.00)
GFR calc Af Amer: 46 mL/min — ABNORMAL LOW (ref >60)
GFR calc non Af Amer: 40 mL/min — ABNORMAL LOW (ref >60)
Glucose, Bld: 102 mg/dL — ABNORMAL HIGH (ref 70–99)
Potassium: 3.7 mmol/L (ref 3.5–5.1)
Sodium: 135 mmol/L (ref 135–145)

## 2019-12-30 LAB — ECHOCARDIOGRAM COMPLETE
Height: 62 in
Weight: 2123.47 oz

## 2019-12-30 MED ORDER — EZETIMIBE 10 MG PO TABS
10.0000 mg | ORAL_TABLET | Freq: Every day | ORAL | Status: DC
  Administered 2019-12-30 – 2019-12-31 (×2): 10 mg via ORAL
  Filled 2019-12-30 (×2): qty 1

## 2019-12-30 MED ORDER — AMIODARONE HCL 200 MG PO TABS
200.0000 mg | ORAL_TABLET | Freq: Two times a day (BID) | ORAL | Status: DC
  Administered 2019-12-30 – 2019-12-31 (×2): 200 mg via ORAL
  Filled 2019-12-30 (×2): qty 1

## 2019-12-30 MED ORDER — AMIODARONE HCL IN DEXTROSE 360-4.14 MG/200ML-% IV SOLN: 30.0000 mg/h | INTRAVENOUS | Status: DC

## 2019-12-30 MED ORDER — DILTIAZEM HCL-DEXTROSE 125-5 MG/125ML-% IV SOLN (PREMIX)
5.0000 mg/h | INTRAVENOUS | Status: DC
  Filled 2019-12-30: qty 125

## 2019-12-30 MED ORDER — POTASSIUM CHLORIDE CRYS ER 20 MEQ PO TBCR
40.0000 meq | EXTENDED_RELEASE_TABLET | Freq: Once | ORAL | Status: AC
  Administered 2019-12-30: 16:00:00 40 meq via ORAL
  Filled 2019-12-30: qty 2

## 2019-12-30 MED ORDER — DILTIAZEM LOAD VIA INFUSION
10.0000 mg | Freq: Once | INTRAVENOUS | Status: DC
  Filled 2019-12-30: qty 10

## 2019-12-30 MED ORDER — AMIODARONE HCL IN DEXTROSE 360-4.14 MG/200ML-% IV SOLN: 60.0000 mg/h | INTRAVENOUS | Status: DC

## 2019-12-30 MED ORDER — AMIODARONE IV BOLUS ONLY 150 MG/100ML
150.0000 mg | Freq: Once | INTRAVENOUS | Status: DC
  Filled 2019-12-30: qty 100

## 2019-12-30 MED ORDER — AMIODARONE IV BOLUS ONLY 150 MG/100ML
150.0000 mg | Freq: Once | INTRAVENOUS | Status: AC
  Administered 2019-12-30: 19:00:00 150 mg via INTRAVENOUS
  Filled 2019-12-30: qty 100

## 2019-12-30 MED ORDER — AMIODARONE LOAD VIA INFUSION
150.0000 mg | Freq: Once | INTRAVENOUS | Status: AC
  Administered 2019-12-30: 16:00:00 150 mg via INTRAVENOUS
  Filled 2019-12-30: qty 83.34

## 2019-12-30 NOTE — Consult Note (Addendum)
The patient has been seen in conjunction with Roby Lofts, PA-C. All aspects of care have been considered and discussed. The patient has been personally interviewed, examined, and all clinical data has been reviewed.   Paroxysmal atrial fibrillation with prior history of bleeding on anticoagulation therapy, and current hospitalization due to left lower extremity CLI.  Has right BKA.  Longstanding history of PAD with prior femoropopliteal bypass.  In recovery patient noted to be in atrial fibrillation, asymptomatic, with otherwise good vital signs.  ASSESSMENT: Paroxysmal atrial fibrillation, now back in normal sinus rhythm.  Prior difficulty with bleeding on oral anticoagulant therapy.  PAD and presumed coronary artery disease.  Clinically stable relative to cardiac symptoms.  Intermittent dyspnea, possibly related to PAF versus anginal equivalent.  RECOMMENDATIONS: Amiodarone therapy will be instituted to help maintain sinus rhythm.  With diminished burden of atrial fibrillation, hopefully the risk of systemic embolization will be lower.  We are not recommending full dose anticoagulation therapy but wonder if the patient would benefit from rivaroxaban 2.5 mg twice daily.  Unfortunately, in this patient any stacking of antiplatelet and anticoagulation therapy increases the risk of bleeding.  We should assume that she has coronary artery disease and agree with continuing low-dose beta-blocker, Plavix, and ezetimibe for lipid lowering.  Most recent LDL was 82.  Given her significant PAD we should probably target a lower LDL of 55 or less.  Consider adding Nexletol outpatient.  2D Doppler echocardiogram has been ordered.  No other cardiac evaluation seems appropriate on the current clinical scenario.   Cardiology Consultation:   Patient ID: [name]; [id number]; [birth year]   Admit date: 12/27/2019 Date of Consult: 12/30/2019  Primary Care Provider: Josetta Huddle, MD Primary  Cardiologist: Prefers to follow with Dr. Tamala Julian; seen by Dr. Angelena Form in 2017  Primary Electrophysiologist:  None   Patient Profile:   [name] is a 86 y.o. female with a PMH of paroxysmal atrial fibrillation not on anticoagulation, HTN, HLD, PVD s/p right BKA in 2018, Fem-pop bypass of LLE, who presented with LLE ischemia now s/p LLE thrombectomy of her graft, CVA/TIA, and GERD, who is being seen today for the evaluation of atrial fibrillation at the request of Dr. Scot Dock.  History of Present Illness:   [name] presented to the hospital 12/27/2019 with complaints of left lower extremity numbness and coldness. There was concern for an occlusion in her graft to left foot and she was taken emergently to the OR for thrombectomy of her left deep femoral artery to anterior tibial artery bypass graft. She tolerated the procedure well. Her post-op course is complicated by atrial fibrillation with RVR with HR in the 120s on the evening of 12/29/19 for which she was given IV metoprolol 2.5mg  with improvement in HR to the 90s. HR up to 140s this morning. Cardiology was consulted for assistance.    She was last evaluated by cardiology at an outpatient visit with Cecilie Kicks, NP in 2017 for preop assessment for Fem-pop bypass graft. Her post-op course was complicated by new onset paroxysmal atrial fibrillation. She was started on eliquis and had a subsequent GI bleed, therefore anticoagulation was discontinued. She has been on metoprolol tartrate 25mg  qHS for rate control.   Echo in 2019 showed EF 60-65%, G1DD, no RWMA, mild LAE, and no significant valvular abnormalities. Her last ischemic evaluation was a NST in 2017 which was without ischemia.   At the time of this evaluation she reports feeling okay. In addition to atrial  fibrillation, her post-op course has been complicated by urinary retention requiring in/out caths, constipation, and leg pain. She denies any palpitations at this time despite HR in  the 140s. She reports occasional palpitations at home, as well as dizzy spells which occur weekly at most. Also with occasional chest pressure which generally occurs at rest. She has had 2 GI bleeds in the past - once with heavy NSAID use and once with initiation of eliquis for PAF. She has never been on xarelto or coumadin. She reports some DOE when getting into her wheelchair. No complaints of SOB at rest, lightheadedness, syncope, recent bleeding, or falls.    Past Medical History:  Diagnosis Date  . Anemia   . Anginal pain ([place])   . Arthritis    "qwhere" (09/05/2016)  . Atrial fibrillation ([place]) 09/2016  . Chronic lower back pain   . Complication of anesthesia    "takes a long time for it to wear off; I can hallucinate if I take too much" (09/05/2016)  . DVT (deep venous thrombosis) ([place]) 10/2009  . Fall from steps 08/31/2013   Fx. pelvis, Left Hip, Left Elbow  . Fibromyalgia   . GERD (gastroesophageal reflux disease)    09/22/16- "no too much anymore"  . GI bleed 10/24/2016  . High cholesterol   . History of blood transfusion   . History of hiatal hernia   . Hypertension   . Macular degeneration, wet ([place])    "started in right eye; now legally blind in that eye; now started in left eye but pretty much in control" (09/05/2016)  . Osteoporosis   . Peripheral vascular disease ([place])    nonviable tissue Right foot  . PONV (postoperative nausea and vomiting)   . Squamous cell carcinoma of skin of right calf Aug. 2015  . Stroke ([place])    TIA's no residual  . TIA (transient ischemic attack)    "several at once; none in a long time" (09/05/2016)    Past Surgical History:  Procedure Laterality Date  . ABDOMINAL AORTAGRAM N/A 12/26/2014   Procedure: ABDOMINAL Maxcine Ham;  Surgeon: Serafina Mitchell, MD;  Location[facility] CATH LAB;  Service: Cardiovascular;  Laterality: N/A;  . AMPUTATION Right 12/23/2016   Procedure: AMPUTATION BELOW KNEE;  Surgeon: Serafina Mitchell, MD;  Location: [place];   Service: Vascular;  Laterality: Right;  . AORTOGRAM N/A 09/26/2016   Procedure: AORTOGRAM;  Surgeon: Waynetta Sandy, MD;  Location: [place];  Service: Vascular;  Laterality: N/A;  . CARPAL TUNNEL RELEASE Right   . CATARACT EXTRACTION W/ INTRAOCULAR LENS  IMPLANT, BILATERAL Bilateral   . COLONOSCOPY    . DILATION AND CURETTAGE OF UTERUS    . DRESSING CHANGE UNDER ANESTHESIA Right 01/16/2017   Procedure: DRESSING CHANGE RIGHT BELOW KNEE AMPUTATION;  Surgeon: Serafina Mitchell, MD;  Location: [place];  Service: Vascular;  Laterality: Right;  . EYE SURGERY Right    "macular OR"  . FEMORAL ARTERY STENT  12-11-10   Left SFA  . FEMORAL-POPLITEAL BYPASS GRAFT Left 09/24/2016   Procedure: REDO FEMORAL TO POPLITEAL ARTERY BYPASS GRAFT USING 6MM PROPATEN RINGED GORTEX GRAFT;  Surgeon: Serafina Mitchell, MD;  Location: [place];  Service: Vascular;  Laterality: Left;  . FEMORAL-TIBIAL BYPASS GRAFT Left 01/16/2017   Procedure: REDO BYPASS GRAFT FEMORAL-TIBIAL ARTERY WITH GORTEX GRAFT;  Surgeon: Serafina Mitchell, MD;  Location: [place];  Service: Vascular;  Laterality: Left;  AND LOWER LEG   . I & D EXTREMITY Right  12/17/2016   Procedure: IRRIGATION AND DEBRIDEMENT RIGHT FOOT;  Surgeon: Serafina Mitchell, MD;  Location: [place];  Service: Vascular;  Laterality: Right;  . INCISION AND DRAINAGE OF WOUND Left 10/25/2009   leg/notes 11/13/2009  . INSERTION OF ILIAC STENT Left 12/26/2014   Procedure: INSERTION OF ILIAC STENT;  Surgeon: Serafina Mitchell, MD;  Location: [facility] CATH LAB;  Service: Cardiovascular;  Laterality: Left;  . INSERTION OF ILIAC STENT Left 09/26/2016   Procedure: SUB INTIMAL INSERTION OF SUPERFICIAL FEMORAL ARTERY AND BELOW KNEE BYPASS GRAFT;  Surgeon: Waynetta Sandy, MD;  Location: [place];  Service: Vascular;  Laterality: Left;  . JOINT REPLACEMENT     knee  . JOINT REPLACEMENT Left Oct. 17, 2014   Elbow ( pt fell 08-31-13 )  . LOWER EXTREMITY ANGIOGRAM Left 12/27/2019   Procedure: Intraoperative  Left Lower Extremity Angiogram;  Surgeon: Angelia Mould, MD;  Location: [place];  Service: Vascular;  Laterality: Left;  . LOWER EXTREMITY ANGIOGRAPHY N/A 09/21/2018   Procedure: LOWER EXTREMITY ANGIOGRAPHY;  Surgeon: Serafina Mitchell, MD;  Location: [place] CV LAB;  Service: Cardiovascular;  Laterality: N/A;  . ORIF SHOULDER FRACTURE Right    "it was crushed"  . PERIPHERAL VASCULAR CATHETERIZATION N/A 10/30/2015   Procedure: Abdominal Aortogram w/Lower Extremity;  Surgeon: Serafina Mitchell, MD;  Location: [place] CV LAB;  Service: Cardiovascular;  Laterality: N/A;  . PERIPHERAL VASCULAR CATHETERIZATION  10/30/2015   Procedure: Peripheral Vascular Intervention;  Surgeon: Serafina Mitchell, MD;  Location: [place] CV LAB;  Service: Cardiovascular;;  . PERIPHERAL VASCULAR CATHETERIZATION N/A 04/01/2016   Procedure: Abdominal Aortogram w/Lower Extremity;  Surgeon: Serafina Mitchell, MD;  Location: [place] CV LAB;  Service: Cardiovascular;  Laterality: N/A;  . PERIPHERAL VASCULAR CATHETERIZATION Left 04/01/2016   Procedure: Peripheral Vascular Atherectomy;  Surgeon: Serafina Mitchell, MD;  Location: [place] CV LAB;  Service: Cardiovascular;  Laterality: Left;  Superficial femoral artery.  Marland Kitchen PERIPHERAL VASCULAR CATHETERIZATION Right 09/05/2016   "stent"  . PERIPHERAL VASCULAR CATHETERIZATION N/A 09/05/2016   Procedure: Abdominal Aortogram w/Lower Extremity;  Surgeon: Serafina Mitchell, MD;  Location: [place] CV LAB;  Service: Cardiovascular;  Laterality: N/A;  . PERIPHERAL VASCULAR CATHETERIZATION Right 09/05/2016   Procedure: Peripheral Vascular Intervention;  Surgeon: Serafina Mitchell, MD;  Location: [place] CV LAB;  Service: Cardiovascular;  Laterality: Right;  Superficial Femoral  . PERIPHERAL VASCULAR CATHETERIZATION Left 09/09/2016   Procedure: Lower Extremity Angiography;  Surgeon: Serafina Mitchell, MD;  Location: [place] CV LAB;  Service: Cardiovascular;  Laterality:  Left;  . PR VEIN BYPASS GRAFT,AORTO-FEM-POP  09-13-09   Left Fem-pop  . THROMBECTOMY FEMORAL ARTERY Right 09/26/2016   Procedure: Thromboembolectomy Right Lower Extremity, Right Femoral Artery Endarterectomy with Patch Angioplasty; Right Lower Extremity Angiogram ;  Surgeon: Waynetta Sandy, MD;  Location: [place];  Service: Vascular;  Laterality: Right;  . THROMBECTOMY FEMORAL ARTERY Left 12/27/2019   Procedure: Redo left femoral artery exposure, Thrombectomy of left deep femoral artery to anterior tibial artery bypass;  Surgeon: Angelia Mould, MD;  Location: [facility] OR;  Service: Vascular;  Laterality: Left;  . TOTAL ELBOW ARTHROPLASTY Left 09/03/2013   Procedure: LEFT TOTAL ELBOW ARTHROPLASTY;  Surgeon: Roseanne Kaufman, MD;  Location: [place];  Service: Orthopedics;  Laterality: Left;  . TOTAL KNEE ARTHROPLASTY Left 06/2006  . TRANSMETATARSAL AMPUTATION Right 12/11/2016   Procedure: TRANSMETATARSAL AMPUTATION;  Surgeon: Serafina Mitchell, MD;  Location: [place];  Service: Vascular;  Laterality: Right;  . TUBAL LIGATION    . VAGINAL HYSTERECTOMY       Home Medications:  Prior to Admission medications   Medication Sig Start Date End Date Taking? Authorizing Provider  acetaminophen (TYLENOL) 500 MG tablet Take 1,000 mg by mouth 2 (two) times daily as needed for moderate pain or headache. Patient took this medication for her pain.   Yes [provider]  Cholecalciferol (VITAMIN D3) 400 units tablet Take 400 Units by mouth at bedtime.    Yes [provider]  clopidogrel (PLAVIX) 75 MG tablet Take 1 tablet (75 mg total) by mouth daily. 01/28/17  Yes Angiulli, Lavon Paganini, PA-C  Cyanocobalamin (VITAMIN B-12) 5000 MCG TBDP Take 5,000 mcg by mouth daily.    Yes [provider]  diazepam (VALIUM) 5 MG tablet Take 0.5 tablets (2.5 mg total) by mouth at bedtime as needed for anxiety. Patient taking differently: Take 2.5-5 mg by mouth daily as needed for anxiety.  01/28/17  Yes  Angiulli, Lavon Paganini, PA-C  furosemide (LASIX) 20 MG tablet Take 20 mg by mouth every Monday, Wednesday, and Friday.   Yes [provider]  gabapentin (NEURONTIN) 300 MG capsule Take 300 mg by mouth 3 (three) times daily.    Yes [provider]  loperamide (IMODIUM) 2 MG capsule Take 2 mg by mouth as needed for diarrhea or loose stools.   Yes [provider]  metoprolol tartrate (LOPRESSOR) 25 MG tablet Take 1 tablet (25 mg total) by mouth at bedtime. 01/28/17  Yes Angiulli, Lavon Paganini, PA-C  Multiple Vitamins-Minerals (EYE VITAMINS PO) Take 1 tablet by mouth at bedtime.    Yes [provider]  oxycodone (OXY-IR) 5 MG capsule Take 5-10 mg by mouth every 4 (four) hours as needed for pain.   Yes [provider]  potassium chloride (K-DUR,KLOR-CON) 10 MEQ tablet Take 10 mEq by mouth daily.   Yes [provider]  valsartan-hydrochlorothiazide (DIOVAN-HCT) 160-12.5 MG tablet Take 0.5 tablets by mouth daily. 01/28/17  Yes Angiulli, Lavon Paganini, PA-C    Inpatient Medications: Scheduled Meds: . Chlorhexidine Gluconate Cloth  6 each Topical Daily  . clopidogrel  75 mg Oral Daily  . docusate sodium  100 mg Oral Daily  . furosemide  20 mg Oral Q M,W,F  . gabapentin  300 mg Oral TID  . heparin injection (subcutaneous)  5,000 Units Subcutaneous Q8H  . irbesartan  75 mg Oral Daily   And  . hydrochlorothiazide  6.25 mg Oral Daily  . metoprolol tartrate  25 mg Oral QHS  . pantoprazole  40 mg Oral Daily   Continuous Infusions: . sodium chloride    . magnesium sulfate bolus IVPB     PRN Meds: sodium chloride, acetaminophen **OR** acetaminophen, alum & mag hydroxide-simeth, diazepam, guaiFENesin-dextromethorphan, hydrALAZINE, HYDROmorphone (DILAUDID) injection, labetalol, loperamide, magnesium sulfate bolus IVPB, metoprolol tartrate, ondansetron, oxyCODONE, phenol, polyethylene glycol, potassium chloride  Allergies:    Allergies  Allergen Reactions  .  Motrin [Ibuprofen] Other (See Comments)    ADVERSE REACTION - GI BLEED  . Statins Other (See Comments)    ADVERSE REACTION MUSCLE PAIN & WEAKNESS  . Eliquis [Apixaban] Other (See Comments)    Lower GI bleeding  . Morphine And Related Other (See Comments)    HALLUCINATIONS REACTION IS SIDE EFFECT  . Promethazine Hcl Other (See Comments)    IV  Drug only, makes her act crazy  . Sulfa Antibiotics Nausea And Vomiting    Social History:   Social  History   Socioeconomic History  . Marital status: Widowed    Spouse name: Not on file  . Number of children: Not on file  . Years of education: Not on file  . Highest education level: Not on file  Occupational History  . Not on file  Tobacco Use  . Smoking status: Former Smoker    Types: Cigarettes    Quit date: 11/17/1946    Years since quitting: 73.1  . Smokeless tobacco: Never Used  . Tobacco comment: "never smoked much"  Substance and Sexual Activity  . Alcohol use: No    Alcohol/week: 0.0 standard drinks  . Drug use: No  . Sexual activity: Not on file  Other Topics Concern  . Not on file  Social History Narrative  . Not on file   Social Determinants of Health   Financial Resource Strain:   . Difficulty of Paying Living Expenses: Not on file  Food Insecurity:   . Worried About Charity fundraiser in the Last Year: Not on file  . Ran Out of Food in the Last Year: Not on file  Transportation Needs:   . Lack of Transportation (Medical): Not on file  . Lack of Transportation (Non-Medical): Not on file  Physical Activity:   . Days of Exercise per Week: Not on file  . Minutes of Exercise per Session: Not on file  Stress:   . Feeling of Stress : Not on file  Social Connections:   . Frequency of Communication with Friends and Family: Not on file  . Frequency of Social Gatherings with Friends and Family: Not on file  . Attends Religious Services: Not on file  . Active Member of Clubs or Organizations: Not on file  . Attends  Archivist Meetings: Not on file  . Marital Status: Not on file  Intimate Partner Violence:   . Fear of Current or Ex-Partner: Not on file  . Emotionally Abused: Not on file  . Physically Abused: Not on file  . Sexually Abused: Not on file    Family History:    Family History  Problem Relation Age of Onset  . Heart disease Father        Heart Disease before age 70  . Hyperlipidemia Father   . Hypertension Father   . Alcohol abuse Father   . Heart disease Brother   . Hyperlipidemia Brother   . Hypertension Brother   . Deep vein thrombosis Brother   . Heart disease Son        Heart Disease before age 59  . Hyperlipidemia Son   . Hypertension Son   . Heart attack Son   . Diabetes Son   . Hypertension Son   . Hyperlipidemia Sister   . Hypertension Sister      ROS:  Please see the history of present illness.  ROS  All other ROS reviewed and negative.     Physical Exam/Data:   Vitals:   12/29/19 2013 12/29/19 2352 12/30/19 0429 12/30/19 0915  BP: (!) 157/61 116/62 (!) 104/57 (!) 137/50  Pulse: 98 96 68 (!) 113  Resp: 15 20 17    Temp: 99.1 F (37.3 C) 99.1 F (37.3 C) 98.5 F (36.9 C)   TempSrc: Oral Oral Oral   SpO2: 92% 94% 98% 95%  Weight:      Height:        Intake/Output Summary (Last 24 hours) at 12/30/2019 1235 Last data filed at 12/30/2019 0443 Gross per 24 hour  Intake 100 ml  Output 1275 ml  Net -1175 ml   Filed Weights   12/28/19 0605  Weight: 60.2 kg   Body mass index is 24.27 kg/m.  General:  Elderly female laying in bed in no acute distress HEENT: sclera anicteric  Neck: no JVD Vascular: No carotid bruits; distal pulses 2+ bilaterally Cardiac:  normal S1, S2; IRIR; no murmurs, rubs, or gallops Lungs:  clear to auscultation bilaterally, no wheezing, rhonchi or rales  Abd: NABS, soft, obese, nontender, no hepatomegaly Ext: no edema Musculoskeletal:  S/p right BKA; multiple surgical scars to LLE (medially/posteriorly) with  trace-1+ edema Skin: warm and dry  Neuro:  CNs 2-12 intact, no focal abnormalities noted Psych:  Normal affect   EKG:  None this admission Telemetry:  Telemetry was personally reviewed and demonstrates:  NSR with conversion to atrial fibrillation with RVR 12/29/19 around 9:30pm with rates in the 120s - initial improvement in rates, however persistently 120s-140s this morning.   Relevant CV Studies: Echocardiogram 06/2018: - Left ventricle: The cavity size was normal. Moderate hypertrophy  of the basal septum with otherwise mild concentric hypertrophy.  Systolic function was normal. The estimated ejection fraction was  in the range of 60% to 65%. Wall motion was normal; there were no  regional wall motion abnormalities. Doppler parameters are  consistent with abnormal left ventricular relaxation (grade 1  diastolic dysfunction). Doppler parameters are consistent with  high ventricular filling pressure.  - Aortic valve: Transvalvular velocity was within the normal range.  There was no stenosis. There was mild to moderate regurgitation.  Regurgitation pressure half-time: 370 ms.  - Mitral valve: Calcified annulus. Transvalvular velocity was  within the normal range. There was no evidence for stenosis.  There was no regurgitation.  - Left atrium: The atrium was mildly dilated.  - Right ventricle: The cavity size was normal. Wall thickness was  normal. Systolic function was normal.  - Atrial septum: No defect or patent foramen ovale was identified  by color flow Doppler.  - Tricuspid valve: There was no regurgitation.   NST 2017: IMPRESSION: 1. No reversible ischemia or infarction.  2. Normal left ventricular wall motion.  3. Left ventricular ejection fraction 73%  4. Non invasive risk stratification*: Low  Laboratory Data:  Chemistry Recent Labs  Lab 12/28/19 1026 12/29/19 0313 12/30/19 0215  NA 139 138 135  K 3.8 3.9 3.7  CL 106 105 106  CO2  20* 23 21*  GLUCOSE 177* 122* 102*  BUN 28* 24* 21  CREATININE 1.19* 1.18* 1.20*  CALCIUM 8.8* 8.7* 8.5*  GFRNONAA 40* 41* 40*  GFRAA 47* 47* 46*  ANIONGAP 13 10 8     Recent Labs  Lab 12/27/19 1835  PROT 7.3  ALBUMIN 4.1  AST 15  ALT 10  ALKPHOS 68  BILITOT 0.8   Hematology Recent Labs  Lab 12/27/19 1835 12/28/19 1026 12/29/19 0313  WBC 8.5 11.7* 11.2*  RBC 4.22 3.38* 2.68*  HGB 13.2 10.8* 8.4*  HCT 41.1 32.3* 26.5*  MCV 97.4 95.6 98.9  MCH 31.3 32.0 31.3  MCHC 32.1 33.4 31.7  RDW 13.1 13.1 13.6  PLT 238 184 203   Cardiac EnzymesNo results for input(s): TROPONINI in the last 168 hours. No results for input(s): TROPIPOC in the last 168 hours.  BNPNo results for input(s): BNP, PROBNP in the last 168 hours.  DDimer No results for input(s): DDIMER in the last 168 hours.  Radiology/Studies:  DG Ang/Ext/Uni/Or Left  Result Date: 12/28/2019 CLINICAL DATA:  Left lower extremity intraoperative arteriogram EXAM: LEFT ANG/EXT/UNI/ OR FLUOROSCOPY TIME:  Not provided COMPARISON:  None. FINDINGS: Single spot radiographic image of the left lower leg is provided for review Intra-arterial contrast has been injected from a more proximal location. Patient is post below the anterior tibial bypass grafting. The distal anastomosis appears widely patent however there is irregular contrast extravasation distal to the anastomosis, potentially the sequela of previous injection as the anterior tibial artery appears patent to the level of the ankle mortise. No discrete intraluminal filling defects to suggest distal embolism. Post left total knee replacement, incompletely evaluated. A vascular stent overlies expected location of the native popliteal artery. Surgical clips are seen scattered about the operative site in medial aspect of the knee. IMPRESSION: Post below knee bypass grafting as detailed above. Electronically Signed   By: Sandi Mariscal M.D.   On: 12/28/2019 07:48   VAS [place] ABI WITH/WO  TBI  Result Date: 12/29/2019 LOWER EXTREMITY DOPPLER STUDY Indications: Peripheral artery disease, and right BKA.  Vascular Interventions: Left fem ATA bypass graft. Limitations: Today's exam was limited due to patient unable to lie flat, patient              intolerant to cuff pressure and involuntary patient movement. Comparison Study: ABI 03-15-19 Performing Technologist: Baldwin Crown RDMS  Examination Guidelines: A complete evaluation includes at minimum, Doppler waveform signals and systolic blood pressure reading at the level of bilateral brachial, anterior tibial, and posterior tibial arteries, when vessel segments are accessible. Bilateral testing is considered an integral part of a complete examination. Photoelectric Plethysmograph (PPG) waveforms and toe systolic pressure readings are included as required and additional duplex testing as needed. Limited examinations for reoccurring indications may be performed as noted.  ABI Findings: +--------+------------------+-----+---------+--------+ Right   Rt Pressure (mmHg)IndexWaveform Comment  +--------+------------------+-----+---------+--------+ [id number]                    triphasic         +--------+------------------+-----+---------+--------+ +---------+------------------+-----+----------+-------+ Left     Lt Pressure (mmHg)IndexWaveform  Comment +---------+------------------+-----+----------+-------+ Brachial 140                    triphasic         +---------+------------------+-----+----------+-------+ PTA      56                0.40 monophasic        +---------+------------------+-----+----------+-------+ DP       67                0.48 monophasic        +---------+------------------+-----+----------+-------+ Great Toe40                0.29 Abnormal          +---------+------------------+-----+----------+-------+  Summary: Right: Below knee amputation. Left: Resting left ankle-brachial index indicates  severe left lower extremity arterial disease. The left toe-brachial index is abnormal. Somehwat limited due to patient shaking when left ankle cuff inflates.  *See table(s) above for measurements and observations.  Electronically signed by Deitra Mayo MD on 12/29/2019 at 4:26:07 PM.   Final     Assessment and Plan:   1. Atrial fibrillation with RVR: patient has a known history of paroxysmal afib. It appears she was initially diagnosed in the post-op setting in 2017. Trialed on eliquis for stroke ppx with significant GI bleed. She has been off anticoagulation since that time. She is generally unaware of her atrial fibrillation and asymptomatic  now despite HR in the 140s making it difficult to ascertain the frequency of her Afib. She is on metoprolol tartrate 25mg  daily at home for rate control. As of 2:15pm she is now back in sinus rhythm. - Risks of anticoagulation likely outweigh benefits given GI bleed history despite CHA2DS2-VASc Score is 7 (HTN, Vascular, Female, Age >85, and Stroke/TIA), especially with need for plavix given recent peripheral vascular intervention - Will focus on rhythm control at this point to minimize episodes of atrial fibrillation. Will start amiodarone IV load with plans to start po 200mg  BID today as well.  - Monitor electrolytes closely for Mg >2, K >4 (repleted K 40 mEq today) - Will update her echocardiogram at this time  2. Chest pain: patient reports intermittent chest pain which occurs without rhyme or reason, described as pressure. No other associated symptoms. This occurs once every 1-2 weeks and lasts for minutes. Given her extensive PAD, suspect she likely has underlying CAD as well. Her last ischemic evaluation was a NST in 2017 which was without ischemia. She has been noted to have coronary and aortic calcifications on CT Chest in 2019.  - Will check an echocardiogram at this time to evaluate LV function and wall motion. If these are reassuring, no further  ischemic evaluation at this time.   3. HTN: BP stable on home metoprolol and irbesartan-HCTZ.  - Continue current regimen  4. HLD:  No recent lipids. Noted to have intolerance to statins  - Will check FLP in AM - Will start zetia 10mg  daily - Consider referral to lipid clinic at discharge given significant PAD history.   5. PVD: s/p R BKA and multiple interventions to LLE, most recently a thrombectomy to left Fem-pop graft 12/28/19 for management of critical limb ischemia this admission.  - Continue management per primary team.     For questions or updates, please contact [place] Please consult [email or web address] for contact info under Cardiology/STEMI.   Signed, Abigail Butts, PA-C  12/30/2019 12:35 PM [phone]

## 2019-12-30 NOTE — Progress Notes (Signed)
Patient not voided since [facility] cathed at [place] previous shift. Patient staes she isn't uncomfortable and has no urge to void. Bladder scanned Patient for 194 milliliters. Will continue to monitor.

## 2019-12-30 NOTE — Evaluation (Signed)
Occupational Therapy Evaluation Patient Details Name: [name] MRN: [id number] DOB: [birth year] Today's Date: 12/30/2019    History of Present Illness Pt is a 86 y.o. F with significant PMH of PVD right BKA 12/2016, L elbow fx, L ankle fx, L acetabular fx, and multiple revascularization procedures of LLE who presents with limb threatening iscehmia and is s/p LLE thrombectomy of her graft 2/10.   Clinical Impression   Pt with decline in function and safety with ADLs and ADL mobility with impaired strength, balance and endurance. Pt lives at home with her daugther and is at w/c level with ADLs/selfcare and mobility; daughter assists with LB selfcare. Py currently requires mod A with bed mobility, max - total A with LB ADLs, min A with UB ADLs (R shoulder impaired), mod A +2 to transfer with slide board with increased time and cues. Pt limited  by nausea and feeling like she might pass out after transfer to w/c. Pt  having difficulty problem solving mobility/transfers due to not being at home in her own setup.  Not able to tolerate sitting up in w/c very long due to feeling sick. HR up to 155 bpm Afib. Pt would benefit from acute OT services to address impairments to maximize level of function and safety   Follow Up Recommendations  Home health OT;Supervision/Assistance - 24 hour    Equipment Recommendations  None recommended by OT    Recommendations for Other Services       Precautions / Restrictions Precautions Precautions: Fall;Other (comment) Precaution Comments: right BKA, watch HR Restrictions Weight Bearing Restrictions: No      Mobility Bed Mobility Overal bed mobility: Needs Assistance Bed Mobility: Supine to Sit Rolling: Max assist   Supine to sit: Mod assist;HOB elevated Sit to supine: Mod assist   General bed mobility comments: Assist with scooting bottom to EOB with pad and stabilizing trunk, cues to reach for rail. HOB very elevated.  Transfers Overall  transfer level: Needs assistance Equipment used: Sliding board Transfers: Lateral/Scoot Transfers          Lateral/Scoot Transfers: Mod assist;+2 safety/equipment;+2 physical assistance General transfer comment: Lateral scoot transfer from bed to/from w/c with Mod A of 2, assist with pad and using slide board;  upon getting to w/c, "I am going to pass out, you need to lay me back down."    Balance Overall balance assessment: Needs assistance Sitting-balance support: Feet unsupported;Single extremity supported Sitting balance-Leahy Scale: Fair Sitting balance - Comments: supervision for safety.                                   ADL either performed or assessed with clinical judgement   ADL Overall ADL's : Needs assistance/impaired Eating/Feeding: Set up;Independent;Sitting;Bed level   Grooming: Wash/dry hands;Wash/dry face;Min guard;Sitting   Upper Body Bathing: Minimal assistance;Sitting   Lower Body Bathing: Maximal assistance;Sitting/lateral leans   Upper Body Dressing : Minimal assistance;Sitting   Lower Body Dressing: Total assistance   Toilet Transfer: Moderate assistance;+2 for safety/equipment;Transfer board;Cueing for sequencing Toilet Transfer Details (indicate cue type and reason): simulated to drop arm manual W/C Toileting- Clothing Manipulation and Hygiene: Total assistance       Functional mobility during ADLs: Moderate assistance;+2 for physical assistance;Cueing for sequencing       Vision Baseline Vision/History: Wears glasses Patient Visual Report: No change from baseline       Perception     Praxis  Pertinent Vitals/Pain Pain Assessment: No/denies pain Pain Intervention(s): Monitored during session;Repositioned     Hand Dominance Right   Extremity/Trunk Assessment Upper Extremity Assessment Upper Extremity Assessment: Generalized weakness;RUE deficits/detail RUE Deficits / Details: R shoulder limited 0-45 degrees    Lower Extremity Assessment Lower Extremity Assessment: Defer to PT evaluation       Communication Communication Communication: No difficulties   Cognition Arousal/Alertness: Awake/alert Behavior During Therapy: WFL for tasks assessed/performed Overall Cognitive Status: No family/caregiver present to determine baseline cognitive functioning                                 General Comments: Pt confused about how to perform transfer not in her own space, "I am all turned around." Unable to problem solve through it and fixated on how set is at home vs in her hospital room   General Comments  HR up to 155 bpm.    Exercises     Shoulder Instructions      Home Living Family/patient expects to be discharged to:: Private residence Living Arrangements: Children Available Help at Discharge: Family;Available 24 hours/day Type of Home: House Home Access: Ramped entrance     Home Layout: One level     Bathroom Shower/Tub: Occupational psychologist: Standard Bathroom Accessibility: Yes How Accessible: Accessible via wheelchair Home Equipment: Wheelchair - manual;Bedside commode;Shower seat          Prior Functioning/Environment Level of Independence: Needs assistance  Gait / Transfers Assistance Needed: Mod I with slide board transfers; requires assist for shower transfer; mod I with wheelchair propulsion ADL's / Homemaking Assistance Needed: assist with LB ADLs   Comments: Sleeps in a recliner at home        OT Problem List: Decreased strength;Impaired balance (sitting and/or standing);Decreased activity tolerance      OT Treatment/Interventions: Self-care/ADL training;DME and/or AE instruction;Therapeutic activities;Balance training;Therapeutic exercise;Patient/family education    OT Goals(Current goals can be found in the care plan section) Acute Rehab OT Goals Patient Stated Goal: feel better OT Goal Formulation: With patient Time For Goal  Achievement: 01/13/20 Potential to Achieve Goals: Good ADL Goals Pt Will Perform Grooming: with supervision;with set-up;sitting;with caregiver independent in assisting Pt Will Perform Upper Body Bathing: with min guard assist;with supervision;with set-up;sitting;with caregiver independent in assisting Pt Will Perform Lower Body Bathing: with mod assist;sitting/lateral leans;with caregiver independent in assisting Pt Will Perform Upper Body Dressing: with min guard assist;with supervision;with set-up;sitting;with caregiver independent in assisting Pt Will Transfer to Toilet: with mod assist;with min assist;with transfer board;bedside commode Additional ADL Goal #1: Pt will complete bed mobility with min A to sit EOB for ADLs and in prep for SB transfers  OT Frequency: Min 2X/week   Barriers to D/C:            Co-evaluation PT/OT/SLP Co-Evaluation/Treatment: Yes Reason for Co-Treatment: Complexity of the patient's impairments (multi-system involvement);For patient/therapist safety;To address functional/ADL transfers   OT goals addressed during session: ADL's and self-care;Proper use of Adaptive equipment and DME      AM-PAC OT "6 Clicks" Daily Activity     Outcome Measure Help from another person eating meals?: None Help from another person taking care of personal grooming?: A Little Help from another person toileting, which includes using toliet, bedpan, or urinal?: Total Help from another person bathing (including washing, rinsing, drying)?: A Lot Help from another person to put on and taking off regular upper body clothing?: A  Little Help from another person to put on and taking off regular lower body clothing?: Total 6 Click Score: 14   End of Session Equipment Utilized During Treatment: Gait belt;Other (comment)(drop ar, W/C, slide board)  Activity Tolerance: Patient limited by fatigue;Other (comment)(stats she feels she will pass out) Patient left: in bed;with call bell/phone  within reach  OT Visit Diagnosis: Other abnormalities of gait and mobility (R26.89);Muscle weakness (generalized) (M62.81)                Time: [id number] OT Time Calculation (min): 29 min Charges:  OT General Charges $OT Visit: 1 Visit OT Evaluation $OT Eval Moderate Complexity: 1 Mod    Britt Bottom 12/30/2019, 12:49 PM

## 2019-12-30 NOTE — Progress Notes (Signed)
  Echocardiogram 2D Echocardiogram has been performed.  Michiel Cowboy 12/30/2019, 3:22 PM

## 2019-12-30 NOTE — Progress Notes (Signed)
Notified by CCMD of patient heart rate converting to Afib with rate of 130-140's . Prn dose of 5 mg IV metoprolol given as ordered and PM dose of PO metoprolol given. Patient now with controlled rate of 80 - 90's patient without complaint. Patient noted to have past medical history of atrial fib.  Will continue to monitor.

## 2019-12-30 NOTE — Progress Notes (Signed)
Physical Therapy Treatment Patient Details Name: [name] MRN: [id number] DOB: 07/24/1928 Today's Date: 12/30/2019    History of Present Illness Pt is a 86 y.o. F with significant PMH of PVD right BKA 12/2016, L elbow fx, L ankle fx, L acetabular fx, and multiple revascularization procedures of LLE who presents with limb threatening iscehmia and is s/p LLE thrombectomy of her graft 2/10.    PT Comments    Patient progressing slowly towards PT goals. Limited mainly today by nausea and feeling like she might pass out after transfer. Mod A for bed mobility with increased time and cues. Pt disoriented and having difficulty problem solving mobility/transfers due to not being at home in her own setup. Requires Mod A of 2 for lateral scoot transfer to/from w/c using slide board. Not able to tolerate sitting up due to feeling sick. HR up to 155 bpm Afib. If daughter able to care for pt at this level and assist with transfers, recommend home without HH. However, pt might benefit from HHPT due to hospital acquired weakness. Will follow.    Follow Up Recommendations  Supervision/Assistance - 24 hour;Home health PT     Equipment Recommendations  None recommended by PT    Recommendations for Other Services       Precautions / Restrictions Precautions Precautions: Fall;Other (comment) Precaution Comments: right BKA, watch HR Restrictions Weight Bearing Restrictions: No    Mobility  Bed Mobility Overal bed mobility: Needs Assistance Bed Mobility: Supine to Sit     Supine to sit: Mod assist;HOB elevated Sit to supine: Mod assist;HOB elevated   General bed mobility comments: Assist with scooting bottom to EOB with pad and stabilizing trunk, cues to reach for rail. HOB very elevated.  Transfers Overall transfer level: Needs assistance Equipment used: Sliding board Transfers: Lateral/Scoot Transfers          Lateral/Scoot Transfers: Mod assist;+2 safety/equipment;+2 physical  assistance General transfer comment: Lateral scoot transfer from bed to/from w/c with Mod A of 2, assist with pad and using slide board;  upon getting to w/c, "I am going to pass out, you need to lay me back down."  Ambulation/Gait                 Stairs             Wheelchair Mobility    Modified Rankin (Stroke Patients Only)       Balance Overall balance assessment: Needs assistance Sitting-balance support: Feet unsupported;Single extremity supported Sitting balance-Leahy Scale: Fair Sitting balance - Comments: supervision for safety.                                    Cognition Arousal/Alertness: Awake/alert Behavior During Therapy: WFL for tasks assessed/performed Overall Cognitive Status: No family/caregiver present to determine baseline cognitive functioning                                 General Comments: Pt all confused about how to perform transfer not in her own space, "I am all turned around."      Exercises      General Comments General comments (skin integrity, edema, etc.): HR up to 155 bpm.      Pertinent Vitals/Pain Pain Assessment: No/denies pain(nausea)    Home Living  Prior Function            PT Goals (current goals can now be found in the care plan section) Progress towards PT goals: Progressing toward goals    Frequency    Min 3X/week      PT Plan Discharge plan needs to be updated    Co-evaluation PT/OT/SLP Co-Evaluation/Treatment: Yes Reason for Co-Treatment: For patient/therapist safety;To address functional/ADL transfers;Complexity of the patient's impairments (multi-system involvement)          AM-PAC PT "6 Clicks" Mobility   Outcome Measure  Help needed turning from your back to your side while in a flat bed without using bedrails?: A Lot Help needed moving from lying on your back to sitting on the side of a flat bed without using bedrails?: A  Lot Help needed moving to and from a bed to a chair (including a wheelchair)?: A Lot Help needed standing up from a chair using your arms (e.g., wheelchair or bedside chair)?: Total Help needed to walk in hospital room?: Total Help needed climbing 3-5 steps with a railing? : Total 6 Click Score: 9    End of Session Equipment Utilized During Treatment: Gait belt Activity Tolerance: Other (comment)(nausea, feeling like she would pass out) Patient left: in bed;with call bell/phone within reach;with bed alarm set Nurse Communication: Mobility status PT Visit Diagnosis: Muscle weakness (generalized) (M62.81);Other (comment)(cardiopulmonary deficits)     Time: [id number] PT Time Calculation (min) (ACUTE ONLY): 29 min  Charges:  $Therapeutic Activity: 8-22 mins                     Marisa Severin, PT, DPT Acute Rehabilitation Services Pager [phone] Office [phone]       Marguarite Arbour A Sabra Heck 12/30/2019, 12:06 PM

## 2019-12-30 NOTE — Progress Notes (Addendum)
Progress Note  VASCULAR SURGERY ASSESSMENT & PLAN:   POD 3 -THROMBECTOMY LEFT DEEP FEMORAL ARTERY TO ANTERIOR TIBIAL ARTERY BYPASS (PTFE): The bypass graft is patent with brisk Doppler signals in the anterior tibial and dorsalis pedis positions.  Her left foot is hyperemic.  ANTICOAGULATION: She is on Plavix.   RAPID A. FIB: Patient developed rapid A. fib this morning.  I have consulted cardiology.    DISPOSITION: Appreciate physical therapy's assistance.  It sounds like the plan will be for her to go home.  The family is satisfied with her current level of function at home.  Deitra Mayo, MD Office: [phone]   12/30/2019 7:58 AM 3 Days Post-Op  Subjective: Patient was noted yesterday evening at approximately 9:40 PM with heart rate in the 130s-140s in atrial fibrillation. She was dosed with metoprolol with control of rate.  She denies chest pain, palpitations, chills or sweats.  States she required in and out catheterization this morning  Vitals:   12/29/19 2352 12/30/19 0429  BP: 116/62 (!) 104/57  Pulse: 96 68  Resp: 20 17  Temp: 99.1 F (37.3 C) 98.5 F (36.9 C)  SpO2: 94% 98%   UOP: 1275cc Physical Exam: Cardiac:  RRR Lungs:  CTA bil Incisions:  Left groin incision well approximated. Mild ecchymosis. No further drain output Extremities:  Chronic left ankle joint deformity.  She has brisk dopplerable posterior tibial, dorsalis pedis and peroneal signals.  Coin sized lesion with eschar of left lower leg without signs of infection Abdomen: Soft, active bowel sounds  CBC    Component Value Date/Time   WBC 11.2 (H) 12/29/2019 0313   RBC 2.68 (L) 12/29/2019 0313   HGB 8.4 (L) 12/29/2019 0313   HCT 26.5 (L) 12/29/2019 0313   PLT 203 12/29/2019 0313   MCV 98.9 12/29/2019 0313   MCH 31.3 12/29/2019 0313   MCHC 31.7 12/29/2019 0313   RDW 13.6 12/29/2019 0313   LYMPHSABS 3.0 12/27/2019 1835   MONOABS 0.9 12/27/2019 1835   EOSABS 0.3 12/27/2019 1835   BASOSABS 0.1 12/27/2019 1835    BMET    Component Value Date/Time   NA 135 12/30/2019 0215   K 3.7 12/30/2019 0215   CL 106 12/30/2019 0215   CO2 21 (L) 12/30/2019 0215   GLUCOSE 102 (H) 12/30/2019 0215   BUN 21 12/30/2019 0215   CREATININE 1.20 (H) 12/30/2019 0215   CALCIUM 8.5 (L) 12/30/2019 0215   GFRNONAA 40 (L) 12/30/2019 0215   GFRAA 46 (L) 12/30/2019 0215     Intake/Output Summary (Last 24 hours) at 12/30/2019 0758 Last data filed at 12/30/2019 0443 Gross per 24 hour  Intake 200 ml  Output 1275 ml  Net -1075 ml    HOSPITAL MEDICATIONS Scheduled Meds: . Chlorhexidine Gluconate Cloth  6 each Topical Daily  . clopidogrel  75 mg Oral Daily  . docusate sodium  100 mg Oral Daily  . furosemide  20 mg Oral Q M,W,F  . gabapentin  300 mg Oral TID  . heparin injection (subcutaneous)  5,000 Units Subcutaneous Q8H  . irbesartan  75 mg Oral Daily   And  . hydrochlorothiazide  6.25 mg Oral Daily  . metoprolol tartrate  25 mg Oral QHS  . pantoprazole  40 mg Oral Daily   Continuous Infusions: . sodium chloride    . magnesium sulfate bolus IVPB     PRN Meds:.sodium chloride, acetaminophen **OR** acetaminophen, alum & mag hydroxide-simeth, diazepam, guaiFENesin-dextromethorphan, hydrALAZINE, HYDROmorphone (DILAUDID) injection, labetalol, loperamide, magnesium sulfate bolus  IVPB, metoprolol tartrate, ondansetron, oxyCODONE, phenol, polyethylene glycol, potassium chloride   ABI Findings:  +--------+------------------+-----+---------+--------+  Right  Rt Pressure (mmHg)IndexWaveform Comment   +--------+------------------+-----+---------+--------+  [id number]           triphasic      +--------+------------------+-----+---------+--------+   +---------+------------------+-----+----------+-------+  Left   Lt Pressure (mmHg)IndexWaveform Comment  +---------+------------------+-----+----------+-------+  Brachial 140            triphasic       +---------+------------------+-----+----------+-------+  PTA   56        0.40 monophasic      +---------+------------------+-----+----------+-------+  DP    67        0.48 monophasic      +---------+------------------+-----+----------+-------+  Great Toe40        0.29 Abnormal       +---------+------------------+-----+----------+-------+     Summary:  Right: Below knee amputation.   Left: Resting left ankle-brachial index indicates severe left lower  extremity arterial disease. The left toe-brachial index is abnormal.   Somehwat limited due to patient shaking when left ankle cuff inflates.    *See table(s) above for measurements and observations.     Assessment:  86 y.o. female is s/p: Left deep femoral artery to anterior tibial artery bypass secondary to acute arterial occlusion.  On Plavix.  History of atrial fibrillation with run of A. fib with RVR  1 dose of metoprolol asymptomatic and hemodynamically stable.  She is maintained on metoprolol 25 mg nightly.  She is unable to tolerate Coumadin, Eliquis or Xarelto.  Subcutaneous heparin continues.  Urinary hesitancy,/retention with good urine output    3 Days Post-Op  Plan: Will obtain urinalysis and culture if requires catheterization. Will discuss case and plan for discharge with Dr. Doren Custard today -DVT prophylaxis: Heparin subcutaneously  Risa Grill, PA-C [facility] [phone] 12/30/2019  7:58 AM

## 2019-12-31 DIAGNOSIS — I10 Essential (primary) hypertension: Secondary | ICD-10-CM

## 2019-12-31 LAB — URINE CULTURE

## 2019-12-31 LAB — MAGNESIUM: Magnesium: 1.9 mg/dL (ref 1.7–2.4)

## 2019-12-31 MED ORDER — OXYCODONE HCL 5 MG PO CAPS: 5.0000 mg | ORAL_CAPSULE | Freq: Four times a day (QID) | ORAL | 0 refills | Status: DC | PRN

## 2019-12-31 MED ORDER — AMIODARONE HCL 200 MG PO TABS: 200.0000 mg | ORAL_TABLET | Freq: Every day | ORAL | 0 refills | Status: DC

## 2019-12-31 NOTE — Discharge Summary (Signed)
Discharge Summary     [name] May 06, 1928 86 y.o. female  [id number]  Admission Date: 12/27/2019  Discharge Date: 12/31/2019  Physician: Angelia Mould, *  Admission Diagnosis: Ischemic leg [I99.8] Critical lower limb ischemia [I99.8]  HPI:   This is a 86 y.o. female has had multiple revascularization attempts in the left lower extremity.  Most recently she had a left deep femoral artery to anterior tibial artery bypass with a prosthetic graft in 2018.  She was last seen in the office in May 2020.  At that time her graft was patent.  Her graft is now occluded with no blood flow to the left foot and clearly limb threatening ischemia.  I have explained that the only option for attempted limb salvage is thrombectomy of the graft.  Given that this occurred at 4 AM today, I do not think she is a good candidate for thrombolysis as this would be more time-consuming.  I have recommended emergent thrombectomy of her graft.  We have sent the Covid test required by the OR and this is pending.  I have discussed the indications for the procedure and the potential complications with the patient and her daughter.  She is in agreement to proceed urgently.  She last ate at 1 PM.  Hospital Course:  The patient was admitted to the hospital and taken to the operating room on 12/28/2019 and underwent: 1.  Redo left femoral artery exposure 2.  Thrombectomy of left deep femoral artery to anterior tibial artery bypass 3.  Intraoperative arteriogram    Findings: The Fogarty catheter passed the entire length.  This was a 3 Fogarty catheter.  Completion arteriogram showed a very small anterior tibial artery.  This may partially be secondary to spasm.  There was also potentially some extravasation.  The proximal anastomosis took a 3.5 mm dilator.  This was anastomosed to the deep femoral artery.  The pt tolerated the procedure well and was transported to the PACU in good condition.   By POD 1,  she did have 220cc of drainage from the [name].  Her heparin was stopped and hopeful this will stop oozing.  Dr. Scot Dock would like to consider long-term anticoagulation given that she has disadvantaged outflow and a prosthetic tibial bypass.  POD 2, JP drainage had slowed down and the JP will be discontinued later in the day if no increase in drainage.  For [facility], she is on Plavix.  She was started on sq heparin.  All things considered given her age and amputation I think she would be at increased risk for Xarelto so we will not place her on an oral anticoagulation.  She apparently has had problems with Coumadin in the past and is also allergic to Eliquis.  POD 3, patent bypass with brisk doppler signals in AT and DP.  Left foot is hyperemic.  She did develop rapid afib and cardiology was consulted.  Plan is for discharge home with family once medically stable.   Per cardiology, she was started on amiodarone IV with plans to start oral amiodarone later in the day.   She also reported chest pain and patient reports intermittent chest pain which occurs without rhyme or reason, described as pressure. No other associated symptoms. This occurs once every 1-2 weeks and lasts for minutes. Given her extensive PAD, suspect she likely has underlying CAD as well. Her last ischemic evaluation was a NST in 2017 which was without ischemia. She has been noted to have coronary and aortic  calcifications on CT Chest in 2019.  - Will check an echocardiogram at this time to evaluate LV function and wall motion. If these are reassuring, no further ischemic evaluation at this time.  She was started on Zetia.    2D echocardiogram on 12/30/2019: IMPRESSIONS  1. Left ventricular ejection fraction, by estimation, is 60 to 65%. The  left ventricle has hyperdynamic function. The left ventricle has no  regional wall motion abnormalities. There is basal septal hypertrophy left  ventricular hypertrophy. Left  ventricular diastolic  parameters were normal.  2. Right ventricular systolic function is normal. The right ventricular  size is normal.  3. Left atrial size was mildly dilated.  4. The mitral valve is normal in structure and function. Mild mitral  valve regurgitation. No evidence of mitral stenosis.  5. The aortic valve is tricuspid. Aortic valve regurgitation is trivial .  Mild aortic valve sclerosis is present, with no evidence of aortic valve  stenosis.   The remainder of the hospital course consisted of increasing mobilization and increasing intake of solids without difficulty.  CBC    Component Value Date/Time   WBC 11.2 (H) 12/29/2019 0313   RBC 2.68 (L) 12/29/2019 0313   HGB 8.4 (L) 12/29/2019 0313   HCT 26.5 (L) 12/29/2019 0313   PLT 203 12/29/2019 0313   MCV 98.9 12/29/2019 0313   MCH 31.3 12/29/2019 0313   MCHC 31.7 12/29/2019 0313   RDW 13.6 12/29/2019 0313   LYMPHSABS 3.0 12/27/2019 1835   MONOABS 0.9 12/27/2019 1835   EOSABS 0.3 12/27/2019 1835   BASOSABS 0.1 12/27/2019 1835    BMET    Component Value Date/Time   NA 135 12/30/2019 0215   K 3.7 12/30/2019 0215   CL 106 12/30/2019 0215   CO2 21 (L) 12/30/2019 0215   GLUCOSE 102 (H) 12/30/2019 0215   BUN 21 12/30/2019 0215   CREATININE 1.20 (H) 12/30/2019 0215   CALCIUM 8.5 (L) 12/30/2019 0215   GFRNONAA 40 (L) 12/30/2019 0215   GFRAA 46 (L) 12/30/2019 0215     Discharge Instructions    Discharge patient   Complete by: As directed    Discharge disposition: 01-Home or Self Care   Discharge patient date: 12/31/2019      Discharge Diagnosis:  Ischemic leg [I99.8] Critical lower limb ischemia [I99.8]  Secondary Diagnosis: Patient Active Problem List   Diagnosis Date Noted  . Critical lower limb ischemia 12/28/2019  . Plantar fasciitis of left foot 06/10/2017  . Idiopathic chronic venous hypertension of both lower extremities with inflammation 05/21/2017  . Foot drop, left 05/21/2017  . Decubitus ulcer of sacral  region, unstageable ([place])   . Deep tissue injury   . Hypokalemia   . Hypoalbuminemia due to protein-calorie malnutrition ([place])   . Debilitated 01/21/2017  . Neuropathic pain   . Post-operative pain   . Slow transit constipation   . Debility   . Ischemic leg   . PAF (paroxysmal atrial fibrillation) ([place])   . Anemia of chronic disease   . Chronic pain syndrome   . Fibromyalgia   . Stage 3 chronic kidney disease   . Benign essential HTN   . Abnormal urinalysis   . PVD (peripheral vascular disease) ([place]) 01/13/2017  . Post-op pain 01/03/2017  . Phantom limb pain ([place]) 01/03/2017  . S/P unilateral BKA (below knee amputation), right ([place]) 12/30/2016  . Acute on chronic combined systolic and diastolic CHF (congestive heart failure) ([place]) 12/22/2016  . Anemia 12/22/2016  . Palliative  care encounter   . Lower extremity pain, right   . Acute GI bleeding 10/23/2016  . Paroxysmal atrial fibrillation ([place]) 10/03/2016  . 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 PAD (peripheral artery disease) ([place]) 09/05/2016  . Groin pain 04/02/2015  . Aftercare following surgery of the circulatory system, [place] 12/13/2013  . Shingles 10/26/2013  . Anxiety 10/26/2013  . Acute blood loss anemia 09/05/2013  . Elbow fracture, left 09/05/2013  . Left acetabular fracture ([place]) 09/05/2013  . Ankle fracture, left 09/05/2013  . HTN (hypertension) 09/03/2013  . HLD (hyperlipidemia) 09/03/2013  . Peripheral vascular disease, unspecified ([place]) 05/10/2012  . Chronic total occlusion of artery of the extremities ([place]) 01/19/2012   Past Medical History:  Diagnosis Date  . Anemia   . Anginal pain ([place])   . Arthritis    "qwhere" (09/05/2016)  . Atrial fibrillation ([place]) 09/2016  . Chronic lower back pain   . Complication of anesthesia    "takes a long time for it to wear off; I can hallucinate if I take too much" (09/05/2016)  . DVT (deep venous  thrombosis) ([place]) 10/2009  . Fall from steps 08/31/2013   Fx. pelvis, Left Hip, Left Elbow  . Fibromyalgia   . GERD (gastroesophageal reflux disease)    09/22/16- "no too much anymore"  . GI bleed 10/24/2016  . High cholesterol   . History of blood transfusion   . History of hiatal hernia   . Hypertension   . Macular degeneration, wet ([place])    "started in right eye; now legally blind in that eye; now started in left eye but pretty much in control" (09/05/2016)  . Osteoporosis   . Peripheral vascular disease ([place])    nonviable tissue Right foot  . PONV (postoperative nausea and vomiting)   . Squamous cell carcinoma of skin of right calf Aug. 2015  . Stroke [facility])    TIA's no residual  . TIA (transient ischemic attack)    "several at once; none in a long time" (09/05/2016)     Allergies as of 12/31/2019      Reactions   Motrin [ibuprofen] Other (See Comments)   ADVERSE REACTION - GI BLEED   Statins Other (See Comments)   ADVERSE REACTION MUSCLE PAIN & WEAKNESS   Eliquis [apixaban] Other (See Comments)   Lower GI bleeding   Morphine And Related Other (See Comments)   HALLUCINATIONS REACTION IS SIDE EFFECT   Promethazine Hcl Other (See Comments)   IV  Drug only, makes her act crazy   Sulfa Antibiotics Nausea And Vomiting      Medication List    TAKE these medications   acetaminophen 500 MG tablet Commonly known as: TYLENOL Take 1,000 mg by mouth 2 (two) times daily as needed for moderate pain or headache. Patient took this medication for her pain.   amiodarone 200 MG tablet Commonly known as: PACERONE Take 1 tablet (200 mg total) by mouth daily.   clopidogrel 75 MG tablet Commonly known as: PLAVIX Take 1 tablet (75 mg total) by mouth daily.   diazepam 5 MG tablet Commonly known as: VALIUM Take 0.5 tablets (2.5 mg total) by mouth at bedtime as needed for anxiety. What changed:   how much to take  when to take this   EYE VITAMINS PO Take 1 tablet by mouth at  bedtime.   furosemide 20 MG tablet Commonly known as: LASIX Take 20 mg by mouth every  Monday, Wednesday, and Friday.   gabapentin 300 MG capsule Commonly known as: NEURONTIN Take 300 mg by mouth 3 (three) times daily.   loperamide 2 MG capsule Commonly known as: IMODIUM Take 2 mg by mouth as needed for diarrhea or loose stools.   metoprolol tartrate 25 MG tablet Commonly known as: LOPRESSOR Take 1 tablet (25 mg total) by mouth at bedtime.   oxycodone 5 MG capsule Commonly known as: OXY-IR Take 1 capsule (5 mg total) by mouth every 6 (six) hours as needed for pain. What changed:   how much to take  when to take this   potassium chloride 10 MEQ tablet Commonly known as: KLOR-CON Take 10 mEq by mouth daily.   valsartan-hydrochlorothiazide 160-12.5 MG tablet Commonly known as: DIOVAN-HCT Take 0.5 tablets by mouth daily.   Vitamin B-12 5000 MCG Tbdp Take 5,000 mcg by mouth daily.   Vitamin D3 10 MCG (400 UNIT) tablet Take 400 Units by mouth at bedtime.       Discharge Instructions: [facility] Discharge instructions Lower Extremity Bypass Surgery  Please refer to the following instruction for your post-procedure care. Your surgeon or physician assistant will discuss any changes with you.  Activity  You are encouraged to walk as much as you can. You can slowly return to normal activities during the month after your surgery. Avoid strenuous activity and heavy lifting until your doctor tells you it's OK. Avoid activities such as vacuuming or swinging a golf club. Do not drive until your doctor give the OK and you are no longer taking prescription pain medications. It is also normal to have difficulty with sleep habits, eating and bowel movement after surgery. These will go away with time.  Bathing/Showering  You may shower after you go home. Do not soak in a bathtub, hot tub, or swim until the incision heals completely.  Incision  Care  Clean your incision with mild soap and water. Shower every day. Pat the area dry with a clean towel. You do not need a bandage unless otherwise instructed. Do not apply any ointments or creams to your incision. If you have open wounds you will be instructed how to care for them or a visiting nurse may be arranged for you. If you have staples or sutures along your incision they will be removed at your post-op appointment. You may have skin glue on your incision. Do not peel it off. It will come off on its own in about one week.  Wash the groin wound with soap and water daily and pat dry. (No tub bath-only shower)  Then put a dry gauze or washcloth in the groin to keep this area dry to help prevent wound infection.  Do this daily and as needed.  Do not use Vaseline or neosporin on your incisions.  Only use soap and water on your incisions and then protect and keep dry.  Diet  Resume your normal diet. There are no special food restrictions following this procedure. A low fat/ low cholesterol diet is recommended for all patients with vascular disease. In order to heal from your surgery, it is CRITICAL to get adequate nutrition. Your body requires vitamins, minerals, and protein. Vegetables are the best source of vitamins and minerals. Vegetables also provide the perfect balance of protein. Processed food has little nutritional value, so try to avoid this.  Medications  Resume taking all your medications unless your doctor or Physician Assistant tells you not to. If your incision  is causing pain, you may take over-the-counter pain relievers such as acetaminophen (Tylenol). If you were prescribed a stronger pain medication, please aware these medication can cause nausea and constipation. Prevent nausea by taking the medication with a snack or meal. Avoid constipation by drinking plenty of fluids and eating foods with high amount of fiber, such as fruits, vegetables, and grains. Take Colace 100 mg (an  over-the-counter stool softener) twice a day as needed for constipation.  Do not take Tylenol if you are taking prescription pain medications.  Follow Up  Our office will schedule a follow up appointment 2-3 weeks following discharge.  Please call us immediately for any of the following conditions  .Severe or worsening pain in your legs or feet while at rest or while walking .Increase pain, redness, warmth, or drainage (pus) from your incision site(s) . Fever of 101 degree or higher . The swelling in your leg with the bypass suddenly worsens and becomes more painful than when you were in the hospital . If you have been instructed to feel your graft pulse then you should do so every day. If you can no longer feel this pulse, call the office immediately. Not all patients are given this instruction. .  Leg swelling is common after leg bypass surgery.  The swelling should improve over a few months following surgery. To improve the swelling, you may elevate your legs above the level of your heart while you are sitting or resting. Your surgeon or physician assistant may ask you to apply an ACE wrap or wear compression (TED) stockings to help to reduce swelling.  Reduce your risk of vascular disease  Stop smoking. If you would like help call [facility] at 1-800-QUIT-NOW (561)770-3617) or [place] at [phone].  . Manage your cholesterol . Maintain a desired weight . Control your diabetes weight . Control your diabetes . Keep your blood pressure down .  If you have any questions, please call the office at [phone]   Prescriptions given: 1.  OxyIR #20 No Refill 2.  Amiodarone 200mg  daily #30 No refill (refills per cardiology)  Disposition: home  Patient's condition: is Good  Follow up: 1. Dr. Scot Dock in 2 weeks 2. afib clinic in one week   Leontine Locket, PA-C [facility] [phone] 12/31/2019  11:11 AM  - For VQI Registry use ---   Post-op:    Wound infection: No  Graft infection: No  Transfusion: No    If yes, n/a units given New Arrhythmia: No Ipsilateral amputation: No, [ ]  Minor, [ ]  BKA, [ ]  AKA Discharge patency: [ ]  Primary, [ x] Primary assisted, [ ]  Secondary, [ ]  Occluded Patency judged by: [x ] Dopper only, [ ]  Palpable graft pulse, []  Palpable distal pulse, [ ]  ABI inc. > 0.15, [ ]  Duplex Discharge ABI: R , L 0.48 D/C Ambulatory Status: Ambulatory  Complications: MI: No, [ ]  Troponin only, [ ]  EKG or Clinical CHF: No Resp failure:No, [ ]  Pneumonia, [ ]  Ventilator Chg in renal function: No, [ ]  Inc. Cr > 0.5, [ ]  Temp. Dialysis,  [ ]  Permanent dialysis Stroke: No, [ ]  Minor, [ ]  Major Return to OR: No  Reason for return to OR: [ ]  Bleeding, [ ]  Infection, [ ]  Thrombosis, [ ]  Revision  Discharge medications: Statin use:  No-allergy ASA use:  no Plavix use:  yes Beta blocker use: yes CCB use:  No ACEI use:   no ARB use:  yes Coumadin use: no

## 2019-12-31 NOTE — Plan of Care (Signed)
  Problem: Education: Goal: Knowledge of General Education information will improve Description: Including pain rating scale, medication(s)/side effects and non-pharmacologic comfort measures 12/31/2019 1338 by Glenard Haring, RN Outcome: Adequate for Discharge 12/31/2019 1255 by Glenard Haring, RN Outcome: Progressing   Problem: Health Behavior/Discharge Planning: Goal: Ability to manage health-related needs will improve 12/31/2019 1338 by Glenard Haring, RN Outcome: Adequate for Discharge 12/31/2019 1255 by Glenard Haring, RN Outcome: Progressing   Problem: Clinical Measurements: Goal: Ability to maintain clinical measurements within normal limits will improve 12/31/2019 1338 by Glenard Haring, RN Outcome: Adequate for Discharge 12/31/2019 1255 by Glenard Haring, RN Outcome: Progressing Goal: Will remain free from infection 12/31/2019 1338 by Glenard Haring, RN Outcome: Adequate for Discharge 12/31/2019 1255 by Glenard Haring, RN Outcome: Progressing Goal: Diagnostic test results will improve 12/31/2019 1338 by Glenard Haring, RN Outcome: Adequate for Discharge 12/31/2019 1255 by Glenard Haring, RN Outcome: Progressing Goal: Respiratory complications will improve 12/31/2019 1338 by Glenard Haring, RN Outcome: Adequate for Discharge 12/31/2019 1255 by Glenard Haring, RN Outcome: Progressing Goal: Cardiovascular complication will be avoided 12/31/2019 1338 by Glenard Haring, RN Outcome: Adequate for Discharge 12/31/2019 1255 by Glenard Haring, RN Outcome: Progressing   Problem: Clinical Measurements: Goal: Will remain free from infection 12/31/2019 1338 by Glenard Haring, RN Outcome: Adequate for Discharge 12/31/2019 1255 by Glenard Haring, RN Outcome: Progressing   Problem: Clinical Measurements: Goal: Diagnostic test results will improve 12/31/2019 1338 by Glenard Haring, RN Outcome: Adequate for Discharge 12/31/2019 1255 by  Glenard Haring, RN Outcome: Progressing

## 2019-12-31 NOTE — Discharge Instructions (Signed)
 [facility] [place]  Discharge instructions  Lower Extremity Bypass Surgery  Please refer to the following instruction for your post-procedure care. Your surgeon or physician assistant will discuss any changes with you.  Activity  You are encouraged to walk as much as you can. You can slowly return to normal activities during the month after your surgery. Avoid strenuous activity and heavy lifting until your doctor tells you it's OK. Avoid activities such as vacuuming or swinging a golf club. Do not drive until your doctor give the OK and you are no longer taking prescription pain medications. It is also normal to have difficulty with sleep habits, eating and bowel movement after surgery. These will go away with time.  Bathing/Showering  Shower daily after you go home. Do not soak in a bathtub, hot tub, or swim until the incision heals completely.  Incision Care  Clean your incision with mild soap and water. Shower every day. Pat the area dry with a clean towel. You do not need a bandage unless otherwise instructed. Do not apply any ointments or creams to your incision. If you have open wounds you will be instructed how to care for them or a visiting nurse may be arranged for you. If you have staples or sutures along your incision they will be removed at your post-op appointment. You may have skin glue on your incision. Do not peel it off. It will come off on its own in about one week.  Wash the groin wound with soap and water daily and pat dry. (No tub bath-only shower)  Then put a dry gauze or washcloth in the groin to keep this area dry to help prevent wound infection.  Do this daily and as needed.  Do not use Vaseline or neosporin on your incisions.  Only use soap and water on your incisions and then protect and keep dry.  Diet  Resume your normal diet. There are no special food restrictions following this procedure. A low fat/ low cholesterol diet is  recommended for all patients with vascular disease. In order to heal from your surgery, it is CRITICAL to get adequate nutrition. Your body requires vitamins, minerals, and protein. Vegetables are the best source of vitamins and minerals. Vegetables also provide the perfect balance of protein. Processed food has little nutritional value, so try to avoid this.  Medications  Resume taking all your medications unless your doctor or physician assistant tells you not to. If your incision is causing pain, you may take over-the-counter pain relievers such as acetaminophen (Tylenol). If you were prescribed a stronger pain medication, please aware these medication can cause nausea and constipation. Prevent nausea by taking the medication with a snack or meal. Avoid constipation by drinking plenty of fluids and eating foods with high amount of fiber, such as fruits, vegetables, and grains. Take Colace 100 mg (an over-the-counter stool softener) twice a day as needed for constipation.  Do not take Tylenol if you are taking prescription pain medications.  Follow Up  Our office will schedule a follow up appointment 2-3 weeks following discharge.  Please call us immediately for any of the following conditions  Severe or worsening pain in your legs or feet while at rest or while walking Increase pain, redness, warmth, or drainage (pus) from your incision site(s) Fever of 101 degree or higher The swelling in your leg with the bypass suddenly worsens and becomes more painful than when you were in the hospital If you have   been instructed to feel your graft pulse then you should do so every day. If you can no longer feel this pulse, call the office immediately. Not all patients are given this instruction.  Leg swelling is common after leg bypass surgery.  The swelling should improve over a few months following surgery. To improve the swelling, you may elevate your legs above the level of your heart while you are  sitting or resting. Your surgeon or physician assistant may ask you to apply an ACE wrap or wear compression (TED) stockings to help to reduce swelling.  Reduce your risk of vascular disease  Stop smoking. If you would like help call [facility] at 1-800-QUIT-NOW (1-800-784-8669) or [place] at [phone].  Manage your cholesterol Maintain a desired weight Control your diabetes weight Control your diabetes Keep your blood pressure down  If you have any questions, please call the office at [phone]  

## 2019-12-31 NOTE — Plan of Care (Signed)
  Problem: Education: Goal: Knowledge of General Education information will improve Description: Including pain rating scale, medication(s)/side effects and non-pharmacologic comfort measures Outcome: Progressing   Problem: Clinical Measurements: Goal: Ability to maintain clinical measurements within normal limits will improve Outcome: Progressing Goal: Will remain free from infection Outcome: Progressing Goal: Diagnostic test results will improve Outcome: Progressing Goal: Respiratory complications will improve Outcome: Progressing Goal: Cardiovascular complication will be avoided Outcome: Progressing   Problem: Pain Managment: Goal: General experience of comfort will improve Outcome: Progressing   Problem: Elimination: Goal: Will not experience complications related to bowel motility Outcome: Progressing Goal: Will not experience complications related to urinary retention Outcome: Progressing   Problem: Nutrition: Goal: Adequate nutrition will be maintained Outcome: Progressing

## 2019-12-31 NOTE — Progress Notes (Addendum)
Progress Note   Subjective   Doing well today, the patient denies CP or SOB.  No new concerns  Inpatient Medications    Scheduled Meds: . amiodarone  200 mg Oral BID  . Chlorhexidine Gluconate Cloth  6 each Topical Daily  . clopidogrel  75 mg Oral Daily  . docusate sodium  100 mg Oral Daily  . ezetimibe  10 mg Oral Daily  . furosemide  20 mg Oral Q M,W,F  . gabapentin  300 mg Oral TID  . heparin injection (subcutaneous)  5,000 Units Subcutaneous Q8H  . irbesartan  75 mg Oral Daily   And  . hydrochlorothiazide  6.25 mg Oral Daily  . metoprolol tartrate  25 mg Oral QHS  . pantoprazole  40 mg Oral Daily   Continuous Infusions: . sodium chloride    . magnesium sulfate bolus IVPB     PRN Meds: sodium chloride, acetaminophen **OR** acetaminophen, alum & mag hydroxide-simeth, diazepam, guaiFENesin-dextromethorphan, hydrALAZINE, HYDROmorphone (DILAUDID) injection, labetalol, loperamide, magnesium sulfate bolus IVPB, metoprolol tartrate, ondansetron, oxyCODONE, phenol, polyethylene glycol, potassium chloride   Vital Signs    Vitals:   12/30/19 2114 12/31/19 0108 12/31/19 0323 12/31/19 0801  BP: (!) 136/57 110/64 (!) 116/51 (!) 134/59  Pulse: 95 85 86 86  Resp: 18 15 19 20   Temp: 98.6 F (37 C) 98.3 F (36.8 C) 98.4 F (36.9 C) 98 F (36.7 C)  TempSrc: Oral Oral Oral Oral  SpO2: 97% 96% 96% 96%  Weight:      Height:        Intake/Output Summary (Last 24 hours) at 12/31/2019 0840 Last data filed at 12/30/2019 2200 Gross per 24 hour  Intake --  Output 775 ml  Net -775 ml   Filed Weights   12/28/19 [id number]  Weight: 60.2 kg    Telemetry    Sinus overnight - Personally Reviewed  Physical Exam   GEN- The patient is elderly and frail appearing, alert  Head- normocephalic, atraumatic Eyes-  Sclera clear, conjunctiva pink Ears- hearing intact Oropharynx- clear Neck- supple, Lungs-   normal work of breathing Heart- Regular rate and rhythm  GI- soft  MS- diffuse  atrophy Skin- no rash or lesion Psych- euthymic mood, full affect Neuro- strength and sensation are intact  Echo 2/12/201- EF 60%, hyperdynamic function  Labs    Chemistry Recent Labs  Lab 12/27/19 1835 12/27/19 1835 12/28/19 1026 12/29/19 0313 12/30/19 0215  NA 136   < > 139 138 135  K 3.7   < > 3.8 3.9 3.7  CL 105   < > 106 105 106  CO2 19*   < > 20* 23 21*  GLUCOSE 98   < > 177* 122* 102*  BUN 36*   < > 28* 24* 21  CREATININE 1.50*   < > 1.19* 1.18* 1.20*  CALCIUM 9.6   < > 8.8* 8.7* 8.5*  PROT 7.3  --   --   --   --   ALBUMIN 4.1  --   --   --   --   AST 15  --   --   --   --   ALT 10  --   --   --   --   ALKPHOS 68  --   --   --   --   BILITOT 0.8  --   --   --   --   GFRNONAA 31*   < > 40* 41* 40*  GFRAA 35*   < >  47* 47* 46*  ANIONGAP 12   < > 13 10 8    < > = values in this interval not displayed.     Hematology Recent Labs  Lab 12/27/19 1835 12/28/19 1026 12/29/19 0313  WBC 8.5 11.7* 11.2*  RBC 4.22 3.38* 2.68*  HGB 13.2 10.8* 8.4*  HCT 41.1 32.3* 26.5*  MCV 97.4 95.6 98.9  MCH 31.3 32.0 31.3  MCHC 32.1 33.4 31.7  RDW 13.1 13.1 13.6  PLT 238 184 203    Assessment & Plan    1.  Paroxysmal atrial fibrillation Occurred post operatively Asymptomatic She reports having post op afib in the past Reduce amiodarone to 200mg  daily at discharge chads2vasc sore is at least 24 (age, female, prior stroke, PVD, HTN) She warrants long term anticoagulation when able.  She has had prior GI bleeding and falls which reduces our enthusiasm for long term anticoagulation. She also requires plavix s/p recent peripheral venous intervention May be best to hold off on oral anticoagulation until she follows up with her primary cardiologist.  If AF burden increases then we may need to reconsider.  2. HTN Stable No change required today  3. PVD S/p R BKA with multiple interventions to the LLE and thrombectomy to L fem/pop graft 12/28/19. Continue plavix as above Dr  Tamala Julian has mentioned adding compass trial dosed xarelto 2.5mg  BID as a consideration.  I will defer to vascular team.  4. Chest pain Conservative measures advised.  Cardiology to see as needed this weekend [facility] to discharge from our standpoint I will arrange follow-up in the AF clinic in 1 week.  Thompson Grayer MD, [facility] 12/31/2019 8:40 AM

## 2019-12-31 NOTE — Progress Notes (Signed)
Discharge instructions given to [name] and her daughter.  Discussed new medications, medication changes and side effects.  Discussed signs and symptoms to watch for and when to contact the physician.  Discussed activities.  Discussed follow up appointments.  Verbalized understanding.

## 2019-12-31 NOTE — Progress Notes (Addendum)
  Progress Note    12/31/2019 10:57 AM 4 Days Post-Op  Subjective:  No complaints  Afebrile HR 80's-110's NSR [id number] systolic [id number] RA  Vitals:   12/31/19 0323 12/31/19 0801  BP: (!) 116/51 (!) 134/59  Pulse: 86 86  Resp: 19 20  Temp: 98.4 F (36.9 C) 98 F (36.7 C)  SpO2: 96% 96%    Physical Exam: Cardiac:  regular Lungs:  Non labored Incisions:  Healing nicely Extremities:  +DP doppler signal   CBC    Component Value Date/Time   WBC 11.2 (H) 12/29/2019 0313   RBC 2.68 (L) 12/29/2019 0313   HGB 8.4 (L) 12/29/2019 0313   HCT 26.5 (L) 12/29/2019 0313   PLT 203 12/29/2019 0313   MCV 98.9 12/29/2019 0313   MCH 31.3 12/29/2019 0313   MCHC 31.7 12/29/2019 0313   RDW 13.6 12/29/2019 0313   LYMPHSABS 3.0 12/27/2019 1835   MONOABS 0.9 12/27/2019 1835   EOSABS 0.3 12/27/2019 1835   BASOSABS 0.1 12/27/2019 1835    BMET    Component Value Date/Time   NA 135 12/30/2019 0215   K 3.7 12/30/2019 0215   CL 106 12/30/2019 0215   CO2 21 (L) 12/30/2019 0215   GLUCOSE 102 (H) 12/30/2019 0215   BUN 21 12/30/2019 0215   CREATININE 1.20 (H) 12/30/2019 0215   CALCIUM 8.5 (L) 12/30/2019 0215   GFRNONAA 40 (L) 12/30/2019 0215   GFRAA 46 (L) 12/30/2019 0215    INR    Component Value Date/Time   INR 0.98 07/08/2018 1114     Intake/Output Summary (Last 24 hours) at 12/31/2019 1057 Last data filed at 12/31/2019 0900 Gross per 24 hour  Intake 0 ml  Output 775 ml  Net -775 ml     Assessment:  86 y.o. female is s/p:  Thrombectomy left DFA to ATA bypass (PTFE)  4 Days Post-Op  Plan: -pt doing well with left DP doppler signal.   -hx post op afib.  She was seen today by cards and felt that she warrants long term AC but has hx of GIB and falls, which reduced enthusiasm for long term AC.  She will continue her Plavix.  Recommend to hold [facility] until she sees her primary cardiologist in follow up.  She will be seen in Afib clinic in one week.   -home today and f/u with  Dr. Scot Dock in a couple of weeks.    Leontine Locket, PA-C [facility] [phone] 12/31/2019 10:57 AM   I have seen and evaluated the patient. I agree with the PA note as documented above.  Excellent left DP Doppler signal.  Left groin looks clean dry and intact.  Patient will be discharged on Plavix.  Appreciate cardiology evaluation for new onset A. fib.  Cardiology is recommending holding off on anticoagulation until she follows up with her primary cardiologist.  We will plan for discharge today.  Marty Heck, MD [facility] [place] Office: [phone]

## 2020-01-05 DIAGNOSIS — I779 Disorder of arteries and arterioles, unspecified: Secondary | ICD-10-CM | POA: Diagnosis not present

## 2020-01-09 ENCOUNTER — Other Ambulatory Visit: Payer: Self-pay

## 2020-01-09 ENCOUNTER — Ambulatory Visit (HOSPITAL_COMMUNITY)
Admission: RE | Admit: 2020-01-09 | Discharge: 2020-01-09 | Disposition: A | Discharge status: Home / Self Care | Payer: Medicare Other | Source: Ambulatory Visit | Attending: Physician Assistant | Admitting: Physician Assistant

## 2020-01-09 ENCOUNTER — Encounter (HOSPITAL_COMMUNITY): Payer: Self-pay | Admitting: Physician Assistant

## 2020-01-09 VITALS — BP 140/68 | HR 70 | Ht 62.0 in | Wt 133.0 lb

## 2020-01-09 DIAGNOSIS — I480 Paroxysmal atrial fibrillation: Secondary | ICD-10-CM | POA: Insufficient documentation

## 2020-01-09 DIAGNOSIS — K219 Gastro-esophageal reflux disease without esophagitis: Secondary | ICD-10-CM | POA: Insufficient documentation

## 2020-01-09 DIAGNOSIS — I10 Essential (primary) hypertension: Secondary | ICD-10-CM | POA: Diagnosis not present

## 2020-01-09 DIAGNOSIS — Z87891 Personal history of nicotine dependence: Secondary | ICD-10-CM | POA: Diagnosis not present

## 2020-01-09 DIAGNOSIS — Z882 Allergy status to sulfonamides status: Secondary | ICD-10-CM | POA: Diagnosis not present

## 2020-01-09 DIAGNOSIS — Z89511 Acquired absence of right leg below knee: Secondary | ICD-10-CM | POA: Insufficient documentation

## 2020-01-09 DIAGNOSIS — Z885 Allergy status to narcotic agent status: Secondary | ICD-10-CM | POA: Insufficient documentation

## 2020-01-09 DIAGNOSIS — Z8249 Family history of ischemic heart disease and other diseases of the circulatory system: Secondary | ICD-10-CM | POA: Diagnosis not present

## 2020-01-09 DIAGNOSIS — Z886 Allergy status to analgesic agent status: Secondary | ICD-10-CM | POA: Insufficient documentation

## 2020-01-09 DIAGNOSIS — I739 Peripheral vascular disease, unspecified: Secondary | ICD-10-CM | POA: Insufficient documentation

## 2020-01-09 DIAGNOSIS — Z8673 Personal history of transient ischemic attack (TIA), and cerebral infarction without residual deficits: Secondary | ICD-10-CM | POA: Insufficient documentation

## 2020-01-09 DIAGNOSIS — Z888 Allergy status to other drugs, medicaments and biological substances status: Secondary | ICD-10-CM | POA: Diagnosis not present

## 2020-01-09 DIAGNOSIS — Z86718 Personal history of other venous thrombosis and embolism: Secondary | ICD-10-CM | POA: Diagnosis not present

## 2020-01-09 DIAGNOSIS — Z79899 Other long term (current) drug therapy: Secondary | ICD-10-CM | POA: Insufficient documentation

## 2020-01-09 DIAGNOSIS — D6869 Other thrombophilia: Secondary | ICD-10-CM | POA: Diagnosis not present

## 2020-01-09 LAB — COMPREHENSIVE METABOLIC PANEL
ALT: 12 U/L (ref 0–44)
AST: 16 U/L (ref 15–41)
Albumin: 3.9 g/dL (ref 3.5–5.0)
Alkaline Phosphatase: 69 U/L (ref 38–126)
Anion gap: 14 (ref 5–15)
BUN: 19 mg/dL (ref 8–23)
CO2: 20 mmol/L — ABNORMAL LOW (ref 22–32)
Calcium: 9.4 mg/dL (ref 8.9–10.3)
Chloride: 103 mmol/L (ref 98–111)
Creatinine, Ser: 1.27 mg/dL — ABNORMAL HIGH (ref 0.44–1.00)
GFR calc Af Amer: 43 mL/min — ABNORMAL LOW (ref >60)
GFR calc non Af Amer: 37 mL/min — ABNORMAL LOW (ref >60)
Glucose, Bld: 102 mg/dL — ABNORMAL HIGH (ref 70–99)
Potassium: 4.8 mmol/L (ref 3.5–5.1)
Sodium: 137 mmol/L (ref 135–145)
Total Bilirubin: 0.8 mg/dL (ref 0.3–1.2)
Total Protein: 6.7 g/dL (ref 6.5–8.1)

## 2020-01-09 LAB — TSH: TSH: 4.024 u[IU]/mL (ref 0.350–4.500)

## 2020-01-09 NOTE — Progress Notes (Signed)
Primary Care Physician: Josetta Huddle, MD Primary Cardiologist: Dr Tamala Julian Primary Electrophysiologist: Dr Rayann Heman Referring Physician: Dr Carolanne Grumbling is a 86 y.o. female with a history of paroxysmal atrial fibrillation, HTN, HLD, PVD s/p right BKA in 2018, Fem-pop bypass of LLE, who presented with LLE ischemia now s/p LLE thrombectomy of her graft, CVA/TIA, and GERD who presents for follow up in the [facility].  The patient was initially diagnosed with atrial fibrillation in 2017 post operatively. She was started on Eliquis but had significant GI bleeding. She has a CHADS2VASC score of 7. She presented to the hospital 12/27/2019 with complaints of left lower extremity numbness and coldness. There was concern for an occlusion in her graft to left foot and she was taken emergently to the OR for thrombectomy of her left deep femoral artery to anterior tibial artery bypass graft. She tolerated the procedure well. Her post-op course is complicated by atrial fibrillation with RVR with HR in the 120s on the evening of 12/29/19 for which she was given IV metoprolol and loaded on amiodarone. She converted to SR prior to discharge.   Patient reports that she has done well since discharge. She is in SR today and is tolerating the amiodarone without difficulty. She denies CP. She does have very brief episodes of palpitations which last seconds to a few minutes.   Today, she denies symptoms of chest pain, shortness of breath, orthopnea, PND, lower extremity edema, dizziness, presyncope, syncope, snoring, daytime somnolence, bleeding, or neurologic sequela. The patient is tolerating medications without difficulties and is otherwise without complaint today.    Atrial Fibrillation Risk Factors:  she does not have symptoms or diagnosis of sleep apnea. she does not have a history of rheumatic fever.   she has a BMI of Body mass index is 24.33 kg/m.Marland Kitchen Filed Weights   01/09/20 1335  Weight: 60.3 kg    Family History  Problem Relation Age of Onset  . Heart disease Father        Heart Disease before age 42  . Hyperlipidemia Father   . Hypertension Father   . Alcohol abuse Father   . Heart disease Brother   . Hyperlipidemia Brother   . Hypertension Brother   . Deep vein thrombosis Brother   . Heart disease Son        Heart Disease before age 87  . Hyperlipidemia Son   . Hypertension Son   . Heart attack Son   . Diabetes Son   . Hypertension Son   . Hyperlipidemia Sister   . Hypertension Sister      Atrial Fibrillation Management history:  Previous antiarrhythmic drugs: amiodarone Previous cardioversions: none Previous ablations: none CHADS2VASC score: 7 Anticoagulation history: previously Eliquis   Past Medical History:  Diagnosis Date  . Anemia   . Anginal pain ([place])   . Arthritis    "qwhere" (09/05/2016)  . Atrial fibrillation ([place]) 09/2016  . Chronic lower back pain   . Complication of anesthesia    "takes a long time for it to wear off; I can hallucinate if I take too much" (09/05/2016)  . DVT (deep venous thrombosis) ([place]) 10/2009  . Fall from steps 08/31/2013   Fx. pelvis, Left Hip, Left Elbow  . Fibromyalgia   . GERD (gastroesophageal reflux disease)    09/22/16- "no too much anymore"  . GI bleed 10/24/2016  . High cholesterol   . History of blood transfusion   . History  of hiatal hernia   . Hypertension   . Macular degeneration, wet ([place])    "started in right eye; now legally blind in that eye; now started in left eye but pretty much in control" (09/05/2016)  . Osteoporosis   . Peripheral vascular disease ([place])    nonviable tissue Right foot  . PONV (postoperative nausea and vomiting)   . Squamous cell carcinoma of skin of right calf Aug. 2015  . Stroke ([place])    TIA's no residual  . TIA (transient ischemic attack)    "several at once; none in a long time" (09/05/2016)   Past Surgical History:  Procedure  Laterality Date  . ABDOMINAL AORTAGRAM N/A 12/26/2014   Procedure: ABDOMINAL Maxcine Ham;  Surgeon: Serafina Mitchell, MD;  Location: [facility] CATH LAB;  Service: Cardiovascular;  Laterality: N/A;  . AMPUTATION Right 12/23/2016   Procedure: AMPUTATION BELOW KNEE;  Surgeon: Serafina Mitchell, MD;  Location: [place];  Service: Vascular;  Laterality: Right;  . AORTOGRAM N/A 09/26/2016   Procedure: AORTOGRAM;  Surgeon: Waynetta Sandy, MD;  Location: [place];  Service: Vascular;  Laterality: N/A;  . CARPAL TUNNEL RELEASE Right   . CATARACT EXTRACTION W/ INTRAOCULAR LENS  IMPLANT, BILATERAL Bilateral   . COLONOSCOPY    . DILATION AND CURETTAGE OF UTERUS    . DRESSING CHANGE UNDER ANESTHESIA Right 01/16/2017   Procedure: DRESSING CHANGE RIGHT BELOW KNEE AMPUTATION;  Surgeon: Serafina Mitchell, MD;  Location: [place];  Service: Vascular;  Laterality: Right;  . EYE SURGERY Right    "macular OR"  . FEMORAL ARTERY STENT  12-11-10   Left SFA  . FEMORAL-POPLITEAL BYPASS GRAFT Left 09/24/2016   Procedure: REDO FEMORAL TO POPLITEAL ARTERY BYPASS GRAFT USING 6MM PROPATEN RINGED GORTEX GRAFT;  Surgeon: Serafina Mitchell, MD;  Location: [place];  Service: Vascular;  Laterality: Left;  . FEMORAL-TIBIAL BYPASS GRAFT Left 01/16/2017   Procedure: REDO BYPASS GRAFT FEMORAL-TIBIAL ARTERY WITH GORTEX GRAFT;  Surgeon: Serafina Mitchell, MD;  Location: [place];  Service: Vascular;  Laterality: Left;  AND LOWER LEG   . I & D EXTREMITY Right 12/17/2016   Procedure: IRRIGATION AND DEBRIDEMENT RIGHT FOOT;  Surgeon: Serafina Mitchell, MD;  Location: [place];  Service: Vascular;  Laterality: Right;  . INCISION AND DRAINAGE OF WOUND Left 10/25/2009   leg/notes 11/13/2009  . INSERTION OF ILIAC STENT Left 12/26/2014   Procedure: INSERTION OF ILIAC STENT;  Surgeon: Serafina Mitchell, MD;  Location: [facility] CATH LAB;  Service: Cardiovascular;  Laterality: Left;  . INSERTION OF ILIAC STENT Left 09/26/2016   Procedure: SUB INTIMAL INSERTION OF SUPERFICIAL FEMORAL  ARTERY AND BELOW KNEE BYPASS GRAFT;  Surgeon: Waynetta Sandy, MD;  Location: [place];  Service: Vascular;  Laterality: Left;  . JOINT REPLACEMENT     knee  . JOINT REPLACEMENT Left Oct. 17, 2014   Elbow ( pt fell 08-31-13 )  . LOWER EXTREMITY ANGIOGRAM Left 12/27/2019   Procedure: Intraoperative Left Lower Extremity Angiogram;  Surgeon: Angelia Mould, MD;  Location: [place];  Service: Vascular;  Laterality: Left;  . LOWER EXTREMITY ANGIOGRAPHY N/A 09/21/2018   Procedure: LOWER EXTREMITY ANGIOGRAPHY;  Surgeon: Serafina Mitchell, MD;  Location: [place] CV LAB;  Service: Cardiovascular;  Laterality: N/A;  . ORIF SHOULDER FRACTURE Right    "it was crushed"  . PERIPHERAL VASCULAR CATHETERIZATION N/A 10/30/2015   Procedure: Abdominal Aortogram w/Lower Extremity;  Surgeon: Serafina Mitchell, MD;  Location: [place] CV LAB;  Service: Cardiovascular;  Laterality: N/A;  . PERIPHERAL VASCULAR CATHETERIZATION  10/30/2015   Procedure: Peripheral Vascular Intervention;  Surgeon: Serafina Mitchell, MD;  Location: [place] CV LAB;  Service: Cardiovascular;;  . PERIPHERAL VASCULAR CATHETERIZATION N/A 04/01/2016   Procedure: Abdominal Aortogram w/Lower Extremity;  Surgeon: Serafina Mitchell, MD;  Location: [place] CV LAB;  Service: Cardiovascular;  Laterality: N/A;  . PERIPHERAL VASCULAR CATHETERIZATION Left 04/01/2016   Procedure: Peripheral Vascular Atherectomy;  Surgeon: Serafina Mitchell, MD;  Location: [place] CV LAB;  Service: Cardiovascular;  Laterality: Left;  Superficial femoral artery.  Marland Kitchen PERIPHERAL VASCULAR CATHETERIZATION Right 09/05/2016   "stent"  . PERIPHERAL VASCULAR CATHETERIZATION N/A 09/05/2016   Procedure: Abdominal Aortogram w/Lower Extremity;  Surgeon: Serafina Mitchell, MD;  Location: [place] CV LAB;  Service: Cardiovascular;  Laterality: N/A;  . PERIPHERAL VASCULAR CATHETERIZATION Right 09/05/2016   Procedure: Peripheral Vascular Intervention;  Surgeon: Serafina Mitchell, MD;  Location: [place] CV LAB;  Service: Cardiovascular;  Laterality: Right;  Superficial Femoral  . PERIPHERAL VASCULAR CATHETERIZATION Left 09/09/2016   Procedure: Lower Extremity Angiography;  Surgeon: Serafina Mitchell, MD;  Location: [place] CV LAB;  Service: Cardiovascular;  Laterality: Left;  . PR VEIN BYPASS GRAFT,AORTO-FEM-POP  09-13-09   Left Fem-pop  . THROMBECTOMY FEMORAL ARTERY Right 09/26/2016   Procedure: Thromboembolectomy Right Lower Extremity, Right Femoral Artery Endarterectomy with Patch Angioplasty; Right Lower Extremity Angiogram ;  Surgeon: Waynetta Sandy, MD;  Location: [place];  Service: Vascular;  Laterality: Right;  . THROMBECTOMY FEMORAL ARTERY Left 12/27/2019   Procedure: Redo left femoral artery exposure, Thrombectomy of left deep femoral artery to anterior tibial artery bypass;  Surgeon: Angelia Mould, MD;  Location[facility] OR;  Service: Vascular;  Laterality: Left;  . TOTAL ELBOW ARTHROPLASTY Left 09/03/2013   Procedure: LEFT TOTAL ELBOW ARTHROPLASTY;  Surgeon: Roseanne Kaufman, MD;  Location: [place];  Service: Orthopedics;  Laterality: Left;  . TOTAL KNEE ARTHROPLASTY Left 06/2006  . TRANSMETATARSAL AMPUTATION Right 12/11/2016   Procedure: TRANSMETATARSAL AMPUTATION;  Surgeon: Serafina Mitchell, MD;  Location: [place];  Service: Vascular;  Laterality: Right;  . TUBAL LIGATION    . VAGINAL HYSTERECTOMY      Current Outpatient Medications  Medication Sig Dispense Refill  . acetaminophen (TYLENOL) 500 MG tablet Take 1,000 mg by mouth 2 (two) times daily as needed for moderate pain or headache. Patient took this medication for her pain.    Marland Kitchen amiodarone (PACERONE) 200 MG tablet Take 1 tablet (200 mg total) by mouth daily. 30 tablet 0  . Cholecalciferol (VITAMIN D3) 400 units tablet Take 400 Units by mouth at bedtime.     . clopidogrel (PLAVIX) 75 MG tablet Take 1 tablet (75 mg total) by mouth daily. 30 tablet 1  . Cyanocobalamin (VITAMIN B-12)  5000 MCG TBDP Take 5,000 mcg by mouth daily.     . diazepam (VALIUM) 5 MG tablet Take 0.5 tablets (2.5 mg total) by mouth at bedtime as needed for anxiety. (Patient taking differently: Take 2.5-5 mg by mouth daily as needed for anxiety. ) 20 tablet 0  . furosemide (LASIX) 20 MG tablet Take 20 mg by mouth every Monday, Wednesday, and Friday.    . gabapentin (NEURONTIN) 300 MG capsule Take 300 mg by mouth 3 (three) times daily.     Marland Kitchen loperamide (IMODIUM) 2 MG capsule Take 2 mg by mouth as needed for diarrhea or loose stools.    . metoprolol tartrate (LOPRESSOR) 25 MG  tablet Take 1 tablet (25 mg total) by mouth at bedtime. 30 tablet 1  . Multiple Vitamins-Minerals (EYE VITAMINS PO) Take 1 tablet by mouth at bedtime.     Marland Kitchen oxycodone (OXY-IR) 5 MG capsule Take 1 capsule (5 mg total) by mouth every 6 (six) hours as needed for pain. 20 capsule 0  . potassium chloride (K-DUR,KLOR-CON) 10 MEQ tablet Take 10 mEq by mouth daily.    . valsartan-hydrochlorothiazide (DIOVAN-HCT) 160-12.5 MG tablet Take 0.5 tablets by mouth daily. 30 tablet 0   No current facility-administered medications for this encounter.    Allergies  Allergen Reactions  . Motrin [Ibuprofen] Other (See Comments)    ADVERSE REACTION - GI BLEED  . Statins Other (See Comments)    ADVERSE REACTION MUSCLE PAIN & WEAKNESS  . Eliquis [Apixaban] Other (See Comments)    Lower GI bleeding  . Morphine And Related Other (See Comments)    HALLUCINATIONS REACTION IS SIDE EFFECT  . Promethazine Hcl Other (See Comments)    IV  Drug only, makes her act crazy  . Sulfa Antibiotics Nausea And Vomiting    Social History   Socioeconomic History  . Marital status: Widowed    Spouse name: Not on file  . Number of children: Not on file  . Years of education: Not on file  . Highest education level: Not on file  Occupational History  . Not on file  Tobacco Use  . Smoking status: Former Smoker    Types: Cigarettes    Quit date: 11/17/1946     Years since quitting: 73.1  . Smokeless tobacco: Never Used  . Tobacco comment: "never smoked much"  Substance and Sexual Activity  . Alcohol use: No    Alcohol/week: 0.0 standard drinks  . Drug use: No  . Sexual activity: Not on file  Other Topics Concern  . Not on file  Social History Narrative  . Not on file   Social Determinants of Health   Financial Resource Strain:   . Difficulty of Paying Living Expenses: Not on file  Food Insecurity:   . Worried About Charity fundraiser in the Last Year: Not on file  . Ran Out of Food in the Last Year: Not on file  Transportation Needs:   . Lack of Transportation (Medical): Not on file  . Lack of Transportation (Non-Medical): Not on file  Physical Activity:   . Days of Exercise per Week: Not on file  . Minutes of Exercise per Session: Not on file  Stress:   . Feeling of Stress : Not on file  Social Connections:   . Frequency of Communication with Friends and Family: Not on file  . Frequency of Social Gatherings with Friends and Family: Not on file  . Attends Religious Services: Not on file  . Active Member of Clubs or Organizations: Not on file  . Attends Archivist Meetings: Not on file  . Marital Status: Not on file  Intimate Partner Violence:   . Fear of Current or Ex-Partner: Not on file  . Emotionally Abused: Not on file  . Physically Abused: Not on file  . Sexually Abused: Not on file     ROS- All systems are reviewed and negative except as per the HPI above.  Physical Exam: Vitals:   01/09/20 1335  BP: 140/68  Pulse: 70  Weight: 60.3 kg  Height: 5\' 2"  (1.575 m)    GEN- The patient is well appearing elderly female, alert and oriented x  3 today.   Head- normocephalic, atraumatic Eyes-  Sclera clear, conjunctiva pink Ears- hearing intact Oropharynx- clear Neck- supple  Lungs- Clear to ausculation bilaterally, normal work of breathing Heart- Regular rate and rhythm, no murmurs, rubs or gallops  GI-  soft, NT, ND, + BS Extremities- no clubbing, cyanosis, or edema MS- no significant deformity or atrophy Skin- no rash or lesion Psych- euthymic mood, full affect Neuro- strength and sensation are intact  Wt Readings from Last 3 Encounters:  01/09/20 60.3 kg  12/28/19 60.2 kg  06/21/19 60 kg    EKG today demonstrates SR HR 70, PR 144, QRS 90, QTc 436  Echo 12/30/19 demonstrated  1. Left ventricular ejection fraction, by estimation, is 60 to 65%. The  left ventricle has hyperdynamic function. The left ventricle has no  regional wall motion abnormalities. There is basal septal hypertrophy left  ventricular hypertrophy. Left  ventricular diastolic parameters were normal.  2. Right ventricular systolic function is normal. The right ventricular  size is normal.  3. Left atrial size was mildly dilated.  4. The mitral valve is normal in structure and function. Mild mitral  valve regurgitation. No evidence of mitral stenosis.  5. The aortic valve is tricuspid. Aortic valve regurgitation is trivial .  Mild aortic valve sclerosis is present, with no evidence of aortic valve  stenosis.   Epic records are reviewed at length today  CHA2DS2-VASc Score = 7 The patient's score is based upon: CHF History: No HTN History: Yes Age : 17 + Diabetes History: No Stroke History: Yes Vascular Disease History: Yes Gender: Female      ASSESSMENT AND PLAN: 1. Paroxysmal Atrial Fibrillation (ICD10:  I48.0) The patient's CHA2DS2-VASc score is 7, indicating a 11.2% annual risk of stroke.   Patient is in [place] today. Continue amiodarone 200 mg daily Check baseline LFTs and TSH today. Continue Lopressor 25 mg daily  2. Secondary Hypercoagulable State (ICD10:  D68.69) The patient is at significant risk for stroke/thromboembolism based upon her CHA2DS2-VASc Score of 7.  However, the patient is not on anticoagulation due to her high bleeding risk.     3. HTN Stable, no changes today.  4.  PVD S/p R BKA with multiple interventions to the LLE and thrombectomy to L fem/pop graft 12/28/19. On Plavix. Plans per vascular.   Follow up with Dr Tamala Julian in 2 months. AF clinic as needed.    [facility] [address] [place], [place] [address] [phone] 01/09/2020 2:12 PM

## 2020-01-12 ENCOUNTER — Other Ambulatory Visit: Payer: Self-pay

## 2020-01-12 DIAGNOSIS — M810 Age-related osteoporosis without current pathological fracture: Secondary | ICD-10-CM | POA: Diagnosis not present

## 2020-01-12 DIAGNOSIS — M169 Osteoarthritis of hip, unspecified: Secondary | ICD-10-CM | POA: Diagnosis not present

## 2020-01-12 DIAGNOSIS — Z89511 Acquired absence of right leg below knee: Secondary | ICD-10-CM

## 2020-01-12 DIAGNOSIS — E7800 Pure hypercholesterolemia, unspecified: Secondary | ICD-10-CM | POA: Diagnosis not present

## 2020-01-12 DIAGNOSIS — I10 Essential (primary) hypertension: Secondary | ICD-10-CM | POA: Diagnosis not present

## 2020-01-12 DIAGNOSIS — E782 Mixed hyperlipidemia: Secondary | ICD-10-CM | POA: Diagnosis not present

## 2020-01-12 DIAGNOSIS — M1990 Unspecified osteoarthritis, unspecified site: Secondary | ICD-10-CM | POA: Diagnosis not present

## 2020-01-12 MED ORDER — CEPHALEXIN 500 MG PO CAPS: 500.0000 mg | ORAL_CAPSULE | Freq: Two times a day (BID) | ORAL | 0 refills | Status: DC

## 2020-01-17 ENCOUNTER — Telehealth (HOSPITAL_COMMUNITY): Payer: Self-pay

## 2020-01-17 NOTE — Telephone Encounter (Signed)

## 2020-01-18 ENCOUNTER — Encounter: Payer: Self-pay | Admitting: Vascular Surgery

## 2020-01-18 ENCOUNTER — Ambulatory Visit (INDEPENDENT_AMBULATORY_CARE_PROVIDER_SITE_OTHER): Payer: Self-pay | Admitting: Vascular Surgery

## 2020-01-18 ENCOUNTER — Other Ambulatory Visit: Payer: Self-pay

## 2020-01-18 VITALS — BP 177/75 | HR 70 | Resp 20 | Ht 62.0 in | Wt 133.0 lb

## 2020-01-18 DIAGNOSIS — Z89511 Acquired absence of right leg below knee: Secondary | ICD-10-CM

## 2020-01-18 DIAGNOSIS — I739 Peripheral vascular disease, unspecified: Secondary | ICD-10-CM

## 2020-01-18 DIAGNOSIS — Z48812 Encounter for surgical aftercare following surgery on the circulatory system: Secondary | ICD-10-CM

## 2020-01-18 NOTE — Progress Notes (Signed)
Patient name: [name] MRN: [id number] DOB: Mar 30, 1928 Sex: female  REASON FOR VISIT:   Follow-up after thrombectomy of left lower extremity bypass graft.  HPI:   [name] is a pleasant 86 y.o. female who had presented on 12/27/2019 with an acutely ischemic left lower extremity.  The patient has had multiple previous revascularization attempts on the left.  Most recently she had a left deep femoral artery to anterior tibial artery bypass with a prosthetic graft by [name] in 2018.  She presented with an ischemic leg and an occluded graft and was taken to the operating room where she underwent thrombectomy of her graft.  Of note she is status post a right below the knee amputation  Previous revascularization attempts of the left lower extremity:  1. October 2010: She had an above-knee to below-knee popliteal artery bypass with vein in the left leg.  2. 12/26/2014: She had angioplasty and stenting of the left external iliac artery with angioplasty and stenting of the left superficial femoral artery.  3. 10/30/2015: She had a stent in the left superficial femoral artery and also angioplasty of an in stent stenosis in the left popliteal artery.  4. 04/01/2016: She had atherectomy of the left superficial femoral artery and popliteal artery and also drug coated balloon angioplasty of the left superficial femoral artery and popliteal artery.  5. 09/24/2016: The patient had a redo left femoral to above-knee popliteal artery bypass with a 6 mm PTFE graft. She also had left common femoral artery external iliac artery and profunda femoral endarterectomy.  6. 09/26/2016: The redo femoropopliteal bypass graft failed early and she underwent subintimal angioplasty of the left superficial femoral artery and below-knee popliteal artery stenting with multiple viabahn grafts.   7. 09/27/2016: She had right common femoral artery endarterectomy with bovine pericardial patch angioplasty  and right lower extremity thromboembolectomy.  8.  01/16/2017: She underwent a left deep femoral artery to anterior tibial artery bypass with a PTFE graft.  9.  12/28/2019: She had a thrombectomy of the left deep femoral artery to anterior tibial artery bypass with intraoperative arteriogram which showed a very small distal anterior tibial artery.  Since discharge she has been doing well.  She does not have a prosthesis her activity is fairly limited.  She is not a smoker.  She is on Plavix.  She has no specific complaints.  She apparently had some small separation of her incision with some drainage and was put on Keflex.  This has essentially healed at this point.  Current Outpatient Medications  Medication Sig Dispense Refill  . acetaminophen (TYLENOL) 500 MG tablet Take 1,000 mg by mouth 2 (two) times daily as needed for moderate pain or headache. Patient took this medication for her pain.    Marland Kitchen amiodarone (PACERONE) 200 MG tablet Take 1 tablet (200 mg total) by mouth daily. 30 tablet 0  . cephALEXin (KEFLEX) 500 MG capsule Take 1 capsule (500 mg total) by mouth 2 (two) times daily. 14 capsule 0  . Cholecalciferol (VITAMIN D3) 400 units tablet Take 400 Units by mouth at bedtime.     . clopidogrel (PLAVIX) 75 MG tablet Take 1 tablet (75 mg total) by mouth daily. 30 tablet 1  . Cyanocobalamin (VITAMIN B-12) 5000 MCG TBDP Take 5,000 mcg by mouth daily.     . diazepam (VALIUM) 5 MG tablet Take 0.5 tablets (2.5 mg total) by mouth at bedtime as needed for anxiety. (Patient taking differently: Take 2.5-5 mg by mouth  daily as needed for anxiety. ) 20 tablet 0  . furosemide (LASIX) 20 MG tablet Take 20 mg by mouth every Monday, Wednesday, and Friday.    . gabapentin (NEURONTIN) 300 MG capsule Take 300 mg by mouth 3 (three) times daily.     Marland Kitchen loperamide (IMODIUM) 2 MG capsule Take 2 mg by mouth as needed for diarrhea or loose stools.    . metoprolol tartrate (LOPRESSOR) 25 MG tablet Take 1 tablet (25 mg  total) by mouth at bedtime. 30 tablet 1  . Multiple Vitamins-Minerals (EYE VITAMINS PO) Take 1 tablet by mouth at bedtime.     Marland Kitchen oxycodone (OXY-IR) 5 MG capsule Take 1 capsule (5 mg total) by mouth every 6 (six) hours as needed for pain. 20 capsule 0  . potassium chloride (K-DUR,KLOR-CON) 10 MEQ tablet Take 10 mEq by mouth daily.    . valsartan-hydrochlorothiazide (DIOVAN-HCT) 160-12.5 MG tablet Take 0.5 tablets by mouth daily. 30 tablet 0   No current facility-administered medications for this visit.    REVIEW OF SYSTEMS:  [X]  denotes positive finding, [ ]  denotes negative finding Vascular    Leg swelling    Cardiac    Chest pain or chest pressure:    Shortness of breath upon exertion:    Short of breath when lying flat:    Irregular heart rhythm:    Constitutional    Fever or chills:     PHYSICAL EXAM:   Vitals:   01/18/20 1051  BP: (!) 177/75  Pulse: 70  Resp: 20  SpO2: 95%  Weight: 133 lb (60.3 kg)  Height: 5\' 2"  (1.575 m)    GENERAL: The patient is a well-nourished female, in no acute distress. The vital signs are documented above. CARDIOVASCULAR: There is a regular rate and rhythm. PULMONARY: There is good air exchange bilaterally without wheezing or rales. VASCULAR: She has a strong anterior tibial signal with the Doppler and a warm well perfused left foot Her left groin incision is healing nicely.  There is currently no erythema or drainage.  DATA:   No new data  MEDICAL ISSUES:   STATUS POST LEFT LOWER EXTREMITY REVASCULARIZATION: As noted above this patient has had 10 previous revascularization attempts.  Currently her graft is patent.  However this is a deep femoral artery to anterior tibial artery bypass graft and given that she is 48 I have explained that certainly with each operation there is some increased risk.  I have encouraged her to stay as active as possible given her limitations with her right below the knee amputation.  We have discussed the  importance of nutrition.  We will have her follow-up with [name] in 3 months with a graft duplex and ABIs.  She knows to call sooner if she has problems.  She will complete her course of Keflex and then stop this.  Deitra Mayo [facility] [phone]

## 2020-01-20 ENCOUNTER — Other Ambulatory Visit: Payer: Self-pay | Admitting: *Deleted

## 2020-01-20 DIAGNOSIS — I739 Peripheral vascular disease, unspecified: Secondary | ICD-10-CM

## 2020-01-20 DIAGNOSIS — I70213 Atherosclerosis of native arteries of extremities with intermittent claudication, bilateral legs: Secondary | ICD-10-CM

## 2020-01-23 DIAGNOSIS — M25562 Pain in left knee: Secondary | ICD-10-CM | POA: Diagnosis not present

## 2020-01-23 DIAGNOSIS — R262 Difficulty in walking, not elsewhere classified: Secondary | ICD-10-CM | POA: Diagnosis not present

## 2020-01-23 DIAGNOSIS — M25522 Pain in left elbow: Secondary | ICD-10-CM | POA: Diagnosis not present

## 2020-01-23 DIAGNOSIS — M25551 Pain in right hip: Secondary | ICD-10-CM | POA: Diagnosis not present

## 2020-01-23 DIAGNOSIS — M25572 Pain in left ankle and joints of left foot: Secondary | ICD-10-CM | POA: Diagnosis not present

## 2020-01-23 DIAGNOSIS — M25511 Pain in right shoulder: Secondary | ICD-10-CM | POA: Diagnosis not present

## 2020-02-08 ENCOUNTER — Other Ambulatory Visit: Payer: Self-pay | Admitting: Interventional Cardiology

## 2020-02-08 DIAGNOSIS — M169 Osteoarthritis of hip, unspecified: Secondary | ICD-10-CM | POA: Diagnosis not present

## 2020-02-08 DIAGNOSIS — M810 Age-related osteoporosis without current pathological fracture: Secondary | ICD-10-CM | POA: Diagnosis not present

## 2020-02-08 DIAGNOSIS — E782 Mixed hyperlipidemia: Secondary | ICD-10-CM | POA: Diagnosis not present

## 2020-02-08 DIAGNOSIS — I10 Essential (primary) hypertension: Secondary | ICD-10-CM | POA: Diagnosis not present

## 2020-02-08 DIAGNOSIS — M1990 Unspecified osteoarthritis, unspecified site: Secondary | ICD-10-CM | POA: Diagnosis not present

## 2020-02-08 DIAGNOSIS — E7800 Pure hypercholesterolemia, unspecified: Secondary | ICD-10-CM | POA: Diagnosis not present

## 2020-02-08 MED ORDER — AMIODARONE HCL 200 MG PO TABS: 200.0000 mg | ORAL_TABLET | Freq: Every day | ORAL | 0 refills | Status: DC

## 2020-02-08 NOTE — Telephone Encounter (Signed)
 *  STAT* If patient is at the pharmacy, call can be transferred to refill team.   1. Which medications need to be refilled? (please list name of each medication and dose if known) amiodarone (PACERONE) 200 MG tablet  2. Which pharmacy/location (including street and city if local pharmacy) is medication to be sent to? [place], [place]  3. Do they need a 30 day or 90 day supply? 90 days  Patient is completely out of medication. Please let daughter [name] know when it is sent so she can go to the pharmacy.

## 2020-02-08 NOTE — Telephone Encounter (Signed)
Pt has an upcoming appt with Dr. Tamala Julian in April. Would Dr. Tamala Julian like to refill pt's medication Amiodarone, pt was being seen [facility] but now will be seeing Dr. Tamala Julian. Please address

## 2020-02-08 NOTE — Telephone Encounter (Signed)
Ok to fill 

## 2020-03-08 DIAGNOSIS — E782 Mixed hyperlipidemia: Secondary | ICD-10-CM | POA: Diagnosis not present

## 2020-03-08 DIAGNOSIS — M169 Osteoarthritis of hip, unspecified: Secondary | ICD-10-CM | POA: Diagnosis not present

## 2020-03-08 DIAGNOSIS — M810 Age-related osteoporosis without current pathological fracture: Secondary | ICD-10-CM | POA: Diagnosis not present

## 2020-03-08 DIAGNOSIS — E7800 Pure hypercholesterolemia, unspecified: Secondary | ICD-10-CM | POA: Diagnosis not present

## 2020-03-08 DIAGNOSIS — M1990 Unspecified osteoarthritis, unspecified site: Secondary | ICD-10-CM | POA: Diagnosis not present

## 2020-03-08 DIAGNOSIS — I10 Essential (primary) hypertension: Secondary | ICD-10-CM | POA: Diagnosis not present

## 2020-03-09 ENCOUNTER — Other Ambulatory Visit: Payer: Self-pay | Admitting: Interventional Cardiology

## 2020-03-09 MED ORDER — AMIODARONE HCL 200 MG PO TABS: 200.0000 mg | ORAL_TABLET | Freq: Every day | ORAL | 0 refills | Status: DC

## 2020-03-09 NOTE — Telephone Encounter (Signed)
Pt's medication was sent to pt's pharmacy as requested. Confirmation received.  °

## 2020-03-09 NOTE — Telephone Encounter (Signed)
Ok to give a 30 day supply.  Will send in further refills once seen incase any changes in dose.

## 2020-03-09 NOTE — Telephone Encounter (Signed)
*  STAT* If patient is at the pharmacy, call can be transferred to refill team.   1. Which medications need to be refilled? (please list name of each medication and dose if known)  amiodarone (PACERONE) 200 MG tablet  2. Which pharmacy/location (including street and city if local pharmacy) is medication to be sent to? [place], [place]  3. Do they need a 30 day or 90 day supply? 90 day supply  Patient is currently out of medication. An appt has been scheduled for 04/02/20.

## 2020-03-12 ENCOUNTER — Ambulatory Visit: Payer: Medicare Other | Admitting: Interventional Cardiology

## 2020-04-02 ENCOUNTER — Encounter: Payer: Self-pay | Admitting: Cardiology

## 2020-04-02 ENCOUNTER — Telehealth: Payer: Self-pay | Admitting: Cardiology

## 2020-04-02 ENCOUNTER — Other Ambulatory Visit: Payer: Self-pay

## 2020-04-02 ENCOUNTER — Ambulatory Visit (INDEPENDENT_AMBULATORY_CARE_PROVIDER_SITE_OTHER): Payer: Medicare Other | Admitting: Cardiology

## 2020-04-02 VITALS — BP 130/60 | HR 73 | Ht 62.0 in | Wt 132.0 lb

## 2020-04-02 DIAGNOSIS — E782 Mixed hyperlipidemia: Secondary | ICD-10-CM | POA: Diagnosis not present

## 2020-04-02 DIAGNOSIS — I739 Peripheral vascular disease, unspecified: Secondary | ICD-10-CM

## 2020-04-02 DIAGNOSIS — I480 Paroxysmal atrial fibrillation: Secondary | ICD-10-CM

## 2020-04-02 DIAGNOSIS — I70213 Atherosclerosis of native arteries of extremities with intermittent claudication, bilateral legs: Secondary | ICD-10-CM | POA: Diagnosis not present

## 2020-04-02 DIAGNOSIS — Z789 Other specified health status: Secondary | ICD-10-CM

## 2020-04-02 DIAGNOSIS — Z89511 Acquired absence of right leg below knee: Secondary | ICD-10-CM | POA: Diagnosis not present

## 2020-04-02 NOTE — Telephone Encounter (Signed)
Patient's daughter states that she will need to come with her mother to her appt with Cecilie Kicks today at 3:45pm. She states that her mother is an amputee and is in a wheelchair.

## 2020-04-02 NOTE — Progress Notes (Signed)
Cardiology Office Note   Date:  04/02/2020   ID:  [name], DOB [birth year], MRN [id number]  PCP:  Karen Huddle, MD  Cardiologist:  Dr. Tamala Julian    Chief Complaint  Patient presents with  . Atrial Fibrillation      History of Present Illness: [name] is a 86 y.o. female who presents for PAF  a history of paroxysmal atrial fibrillation, HTN, HLD, PVD s/p right BKA in 2018, Fem-pop bypass of LLE, who presented with LLE ischemia now s/p LLE thrombectomy of her graft, CVA/TIA, and GERD who presents for follow up in the [facility].  The patient was initially diagnosed with atrial fibrillation in 2017 post operatively. She was started on Eliquis but had significant GI bleeding. She has a CHADS2VASC score of 7. She presented to the hospital 12/27/2019 with complaints of left lower extremity numbness and coldness. There was concern for an occlusion in her graft to left foot and she was taken emergently to the OR for thrombectomy of her left deep femoral artery to anterior tibial artery bypass graft. She tolerated the procedure well. Her post-op course is complicated by atrial fibrillation with RVR with HR in the 120s on the evening of 12/29/19 for which she was given IV metoprolol and loaded on amiodarone. She converted to SR prior to discharge.   Patient reports that she has done well since discharge. She is in SR today and is tolerating the amiodarone without difficulty. She denies CP. She does have very brief episodes of palpitations which last seconds to a few minutes.  Atrial Fibrillation Management history:  Previous antiarrhythmic drugs: amiodarone Previous cardioversions: none Previous ablations: none CHADS2VASC score: 7 Anticoagulation history: previously Eliquis Risks of anticoagulation likely outweigh benefits given GI bleed history despite CHA2DS2-VASc Score is 7 (HTN, Vascular, Female, Age >30, and Stroke/TIA), especially with need for  plavix given recent peripheral vascular intervention in 2017 she refused anticoagulation after GI bleed.  LDL target of 55 or less. Intolerant to statins.      Today she is doing well. No chest pain and stable dyspnea.  She sleeps in a recliner to raise herself up when needed -difficult to raise herself with past upper ext injury and surgery.  Her chronic dyspnea is stable.  No bleeding in stool or urine.  Dr. Rayann Heman saw prior to discharge and recommended anticoagulation but will defer to Dr. Tamala Julian.   Pt and her daughter are not anxious to go back on.   She is on lasix 3 times per week and this helps with swelling and SOB.    Past Medical History:  Diagnosis Date  . Anemia   . Anginal pain ([place])   . Arthritis    "qwhere" (09/05/2016)  . Atrial fibrillation ([place]) 09/2016  . Chronic lower back pain   . Complication of anesthesia    "takes a long time for it to wear off; I can hallucinate if I take too much" (09/05/2016)  . DVT (deep venous thrombosis) ([place]) 10/2009  . Fall from steps 08/31/2013   Fx. pelvis, Left Hip, Left Elbow  . Fibromyalgia   . GERD (gastroesophageal reflux disease)    09/22/16- "no too much anymore"  . GI bleed 10/24/2016  . High cholesterol   . History of blood transfusion   . History of hiatal hernia   . Hypertension   . Macular degeneration, wet ([place])    "started in right eye; now legally blind in that eye; now  started in left eye but pretty much in control" (09/05/2016)  . Osteoporosis   . Peripheral vascular disease ([place])    nonviable tissue Right foot  . PONV (postoperative nausea and vomiting)   . Squamous cell carcinoma of skin of right calf Aug. 2015  . Stroke ([place])    TIA's no residual  . TIA (transient ischemic attack)    "several at once; none in a long time" (09/05/2016)    Past Surgical History:  Procedure Laterality Date  . ABDOMINAL AORTAGRAM N/A 12/26/2014   Procedure: ABDOMINAL Maxcine Ham;  Surgeon: Serafina Mitchell, MD;  Location: [facility] CATH  LAB;  Service: Cardiovascular;  Laterality: N/A;  . AMPUTATION Right 12/23/2016   Procedure: AMPUTATION BELOW KNEE;  Surgeon: Serafina Mitchell, MD;  Location: [place];  Service: Vascular;  Laterality: Right;  . AORTOGRAM N/A 09/26/2016   Procedure: AORTOGRAM;  Surgeon: Waynetta Sandy, MD;  Location: [place];  Service: Vascular;  Laterality: N/A;  . CARPAL TUNNEL RELEASE Right   . CATARACT EXTRACTION W/ INTRAOCULAR LENS  IMPLANT, BILATERAL Bilateral   . COLONOSCOPY    . DILATION AND CURETTAGE OF UTERUS    . DRESSING CHANGE UNDER ANESTHESIA Right 01/16/2017   Procedure: DRESSING CHANGE RIGHT BELOW KNEE AMPUTATION;  Surgeon: Serafina Mitchell, MD;  Location: [place];  Service: Vascular;  Laterality: Right;  . EYE SURGERY Right    "macular OR"  . FEMORAL ARTERY STENT  12-11-10   Left SFA  . FEMORAL-POPLITEAL BYPASS GRAFT Left 09/24/2016   Procedure: REDO FEMORAL TO POPLITEAL ARTERY BYPASS GRAFT USING 6MM PROPATEN RINGED GORTEX GRAFT;  Surgeon: Serafina Mitchell, MD;  Location: [place];  Service: Vascular;  Laterality: Left;  . FEMORAL-TIBIAL BYPASS GRAFT Left 01/16/2017   Procedure: REDO BYPASS GRAFT FEMORAL-TIBIAL ARTERY WITH GORTEX GRAFT;  Surgeon: Serafina Mitchell, MD;  Location: [place];  Service: Vascular;  Laterality: Left;  AND LOWER LEG   . I & D EXTREMITY Right 12/17/2016   Procedure: IRRIGATION AND DEBRIDEMENT RIGHT FOOT;  Surgeon: Serafina Mitchell, MD;  Location: [place];  Service: Vascular;  Laterality: Right;  . INCISION AND DRAINAGE OF WOUND Left 10/25/2009   leg/notes 11/13/2009  . INSERTION OF ILIAC STENT Left 12/26/2014   Procedure: INSERTION OF ILIAC STENT;  Surgeon: Serafina Mitchell, MD;  Location: [facility] CATH LAB;  Service: Cardiovascular;  Laterality: Left;  . INSERTION OF ILIAC STENT Left 09/26/2016   Procedure: SUB INTIMAL INSERTION OF SUPERFICIAL FEMORAL ARTERY AND BELOW KNEE BYPASS GRAFT;  Surgeon: Waynetta Sandy, MD;  Location: [place];  Service: Vascular;  Laterality: Left;  . JOINT  REPLACEMENT     knee  . JOINT REPLACEMENT Left Oct. 17, 2014   Elbow ( pt fell 08-31-13 )  . LOWER EXTREMITY ANGIOGRAM Left 12/27/2019   Procedure: Intraoperative Left Lower Extremity Angiogram;  Surgeon: Angelia Mould, MD;  Location: [place];  Service: Vascular;  Laterality: Left;  . LOWER EXTREMITY ANGIOGRAPHY N/A 09/21/2018   Procedure: LOWER EXTREMITY ANGIOGRAPHY;  Surgeon: Serafina Mitchell, MD;  Location: [place] CV LAB;  Service: Cardiovascular;  Laterality: N/A;  . ORIF SHOULDER FRACTURE Right    "it was crushed"  . PERIPHERAL VASCULAR CATHETERIZATION N/A 10/30/2015   Procedure: Abdominal Aortogram w/Lower Extremity;  Surgeon: Serafina Mitchell, MD;  Location: [place] CV LAB;  Service: Cardiovascular;  Laterality: N/A;  . PERIPHERAL VASCULAR CATHETERIZATION  10/30/2015   Procedure: Peripheral Vascular Intervention;  Surgeon: Serafina Mitchell, MD;  Location:  [place] INVASIVE CV LAB;  Service: Cardiovascular;;  . PERIPHERAL VASCULAR CATHETERIZATION N/A 04/01/2016   Procedure: Abdominal Aortogram w/Lower Extremity;  Surgeon: Serafina Mitchell, MD;  Location: [place] CV LAB;  Service: Cardiovascular;  Laterality: N/A;  . PERIPHERAL VASCULAR CATHETERIZATION Left 04/01/2016   Procedure: Peripheral Vascular Atherectomy;  Surgeon: Serafina Mitchell, MD;  Location: [place] CV LAB;  Service: Cardiovascular;  Laterality: Left;  Superficial femoral artery.  Marland Kitchen PERIPHERAL VASCULAR CATHETERIZATION Right 09/05/2016   "stent"  . PERIPHERAL VASCULAR CATHETERIZATION N/A 09/05/2016   Procedure: Abdominal Aortogram w/Lower Extremity;  Surgeon: Serafina Mitchell, MD;  Location: [place] CV LAB;  Service: Cardiovascular;  Laterality: N/A;  . PERIPHERAL VASCULAR CATHETERIZATION Right 09/05/2016   Procedure: Peripheral Vascular Intervention;  Surgeon: Serafina Mitchell, MD;  Location: [place] CV LAB;  Service: Cardiovascular;  Laterality: Right;  Superficial Femoral  . PERIPHERAL VASCULAR  CATHETERIZATION Left 09/09/2016   Procedure: Lower Extremity Angiography;  Surgeon: Serafina Mitchell, MD;  Location: [place] CV LAB;  Service: Cardiovascular;  Laterality: Left;  . PR VEIN BYPASS GRAFT,AORTO-FEM-POP  09-13-09   Left Fem-pop  . THROMBECTOMY FEMORAL ARTERY Right 09/26/2016   Procedure: Thromboembolectomy Right Lower Extremity, Right Femoral Artery Endarterectomy with Patch Angioplasty; Right Lower Extremity Angiogram ;  Surgeon: Waynetta Sandy, MD;  Location: [place];  Service: Vascular;  Laterality: Right;  . THROMBECTOMY FEMORAL ARTERY Left 12/27/2019   Procedure: Redo left femoral artery exposure, Thrombectomy of left deep femoral artery to anterior tibial artery bypass;  Surgeon: Angelia Mould, MD;  [facility]: [facility] OR;  Service: Vascular;  Laterality: Left;  . TOTAL ELBOW ARTHROPLASTY Left 09/03/2013   Procedure: LEFT TOTAL ELBOW ARTHROPLASTY;  Surgeon: Roseanne Kaufman, MD;  Location: [place];  Service: Orthopedics;  Laterality: Left;  . TOTAL KNEE ARTHROPLASTY Left 06/2006  . TRANSMETATARSAL AMPUTATION Right 12/11/2016   Procedure: TRANSMETATARSAL AMPUTATION;  Surgeon: Serafina Mitchell, MD;  Location: [place];  Service: Vascular;  Laterality: Right;  . TUBAL LIGATION    . VAGINAL HYSTERECTOMY       Current Outpatient Medications  Medication Sig Dispense Refill  . acetaminophen (TYLENOL) 500 MG tablet Take 1,000 mg by mouth 2 (two) times daily as needed for moderate pain or headache. Patient took this medication for her pain.    Marland Kitchen amiodarone (PACERONE) 200 MG tablet Take 1 tablet (200 mg total) by mouth daily. Please keep upcoming appt in May before anymore refills. Final Attempt 30 tablet 0  . Cholecalciferol (VITAMIN D3) 400 units tablet Take 400 Units by mouth at bedtime.     . clopidogrel (PLAVIX) 75 MG tablet Take 1 tablet (75 mg total) by mouth daily. 30 tablet 1  . Cyanocobalamin (VITAMIN B-12) 5000 MCG TBDP Take 5,000 mcg by mouth daily.     . diazepam  (VALIUM) 5 MG tablet Take 0.5 tablets (2.5 mg total) by mouth at bedtime as needed for anxiety. 20 tablet 0  . furosemide (LASIX) 20 MG tablet Take 20 mg by mouth every Monday, Wednesday, and Friday.    . gabapentin (NEURONTIN) 300 MG capsule Take 300 mg by mouth 3 (three) times daily.     Marland Kitchen loperamide (IMODIUM) 2 MG capsule Take 2 mg by mouth as needed for diarrhea or loose stools.    . metoprolol tartrate (LOPRESSOR) 25 MG tablet Take 1 tablet (25 mg total) by mouth at bedtime. 30 tablet 1  . Multiple Vitamins-Minerals (EYE VITAMINS PO) Take 1 tablet by mouth at bedtime.     Marland Kitchen  oxycodone (OXY-IR) 5 MG capsule Take 1 capsule (5 mg total) by mouth every 6 (six) hours as needed for pain. 20 capsule 0  . potassium chloride (K-DUR,KLOR-CON) 10 MEQ tablet Take 10 mEq by mouth daily.    . valsartan-hydrochlorothiazide (DIOVAN-HCT) 160-12.5 MG tablet Take 0.5 tablets by mouth daily. 30 tablet 0   No current facility-administered medications for this visit.    Allergies:   Motrin [ibuprofen], Statins, Eliquis [apixaban], Morphine and related, Promethazine hcl, and Sulfa antibiotics    Social History:  The patient  reports that she quit smoking about 73 years ago. Her smoking use included cigarettes. She has never used smokeless tobacco. She reports that she does not drink alcohol or use drugs.   Family History:  The patient's family history includes Alcohol abuse in her father; Deep vein thrombosis in her brother; Diabetes in her son; Heart attack in her son; Heart disease in her brother, father, and son; Hyperlipidemia in her brother, father, sister, and son; Hypertension in her brother, father, sister, son, and son.    ROS:  General:no colds or fevers, no weight changes Skin:no rashes or ulcers HEENT:no blurred vision, no congestion CV:see HPI PUL:see HPI GI:no diarrhea constipation or melena, no indigestion GU:no hematuria, no dysuria MS:no joint pain, no claudication Neuro:no syncope, no  lightheadedness Endo:no diabetes, no thyroid disease  Wt Readings from Last 3 Encounters:  04/02/20 132 lb (59.9 kg)  01/18/20 133 lb (60.3 kg)  01/09/20 133 lb (60.3 kg)     PHYSICAL EXAM: VS:  BP 130/60   Pulse 73   Ht 5\' 2"  (1.575 m)   Wt 132 lb (59.9 kg) Comment: per patient's daughter reported  SpO2 92%   BMI 24.14 kg/m  , BMI Body mass index is 24.14 kg/m. General:Pleasant affect, NAD Skin:Warm and dry, brisk capillary refill HEENT:normocephalic, sclera clear, mucus membranes moist Neck:supple, no JVD, no bruits  Heart:S1S2 RRR without murmur, gallup, rub or click SR Lungs:with wheezes no rales occ rhonchi.  [id number], non tender, + BS, do not palpate liver spleen or masses Ext:no lower ext edema on Lt , Rt BKA  2+ radial pulses Neuro:alert and oriented X 3, MAE, follows commands, + facial symmetry    EKG:  EKG is not ordered today.    Recent Labs: 12/29/2019: Hemoglobin 8.4; Platelets 203 12/31/2019: Magnesium 1.9 01/09/2020: ALT 12; BUN 19; Creatinine, Ser 1.27; Potassium 4.8; Sodium 137; TSH 4.024    Lipid Panel    Component Value Date/Time   CHOL  02/11/2009 1850    183        ATP III CLASSIFICATION:  <200     mg/dL   Desirable  200-239  mg/dL   Borderline High  >=240    mg/dL   High          TRIG 342 (H) 02/11/2009 1850   HDL 28 (L) 02/11/2009 1850   CHOLHDL 6.5 02/11/2009 1850   VLDL 68 (H) 02/11/2009 1850   LDLCALC  02/11/2009 1850    87        Total Cholesterol/HDL:CHD Risk Coronary Heart Disease Risk Table                     Men   Women  1/2 Average Risk   3.4   3.3  Average Risk       5.0   4.4  2 X Average Risk   9.6   7.1  3 X Average Risk  23.4   11.0  Use the calculated Patient Ratio above and the CHD Risk Table to determine the patient's CHD Risk.        ATP III CLASSIFICATION (LDL):  <100     mg/dL   Optimal  100-129  mg/dL   Near or Above                    Optimal  130-159  mg/dL   Borderline  160-189  mg/dL   High   >190     mg/dL   Very High       Other studies Reviewed: Additional studies/ records that were reviewed today include: . Echo 12/30/19 IMPRESSIONS    1. Left ventricular ejection fraction, by estimation, is 60 to 65%. The  left ventricle has hyperdynamic function. The left ventricle has no  regional wall motion abnormalities. There is basal septal hypertrophy left  ventricular hypertrophy. Left  ventricular diastolic parameters were normal.  2. Right ventricular systolic function is normal. The right ventricular  size is normal.  3. Left atrial size was mildly dilated.  4. The mitral valve is normal in structure and function. Mild mitral  valve regurgitation. No evidence of mitral stenosis.  5. The aortic valve is tricuspid. Aortic valve regurgitation is trivial .  Mild aortic valve sclerosis is present, with no evidence of aortic valve  stenosis.   FINDINGS  Left Ventricle: Left ventricular ejection fraction, by estimation, is 60  to 65%. The left ventricle has hyperdynamic function. The left ventricle  has no regional wall motion abnormalities. The left ventricular internal  cavity size was small. There is no  basal septal hypertrophy left ventricular hypertrophy. Left ventricular  diastolic parameters were normal.   Right Ventricle: The right ventricular size is normal. No increase in  right ventricular wall thickness. Right ventricular systolic function is  normal.   Left Atrium: Left atrial size was mildly dilated.   Right Atrium: Right atrial size   ASSESSMENT AND PLAN:  1.  PAF post surgery, now in SR by exam and by EKG in Feb.  She is doing well on amiodarone 200 daily.  She had labs done for maintainance in Feb and hepatic and TSH were normal. Reviewed last hospital records and records form 2017.  2.  anticoagulation - has bled in past on anticoagulation and at that time refused to try again.  Prior to discharge Dr. Rayann Heman raised need for anticoagulation.  Will defer to Dr. Thompson Caul decision.   3.  HLD last LDL was 179 and intolerant of statins.  Will have her seen in lipid clinic with her significant PAD.  She would benefit with lower cholesterol  4. PAD and hx of BKA on Rt.  And iliac stenting, a left femoral to above-knee popliteal artery bypass with PTFE, and outflow revascularization.  Most recently she had a deep femoral artery to anterior tibial artery bypass with PTFE in March 2018. In 12/2019 she had acute ischemia of Lt lower ext and thrombectomy of left deep femoral artery to ant tibial artery bypass. Followed by [name].   Again concern for GI bleed so pt is on plavix.    Current medicines are reviewed with the patient today.  The patient Has no concerns regarding medicines.  The following changes have been made:  See above Labs/ tests ordered today include:see above  Disposition:   FU:  see above  Signed, Cecilie Kicks, NP  04/02/2020 4:08 PM    [id number], [place], [place] Phone: [phone]; Fax: [phone]

## 2020-04-02 NOTE — Telephone Encounter (Signed)
Informed the pts Daughter [name], that she is allowed to accompany the pt to her visit with Cecilie Kicks NP later this afternoon, due to her Mother being wheelchair bound.  Informed [name] that I will update this in appt notes, so that the tresh hold table will allow her up with the pt.  Advised the daughter that both parties should wear their facial mask to the appt and during the entire duration of the appt.  Cindy verbalized understanding and agrees with this plan. [name] was gracious for all the assistance provided.

## 2020-04-02 NOTE — Patient Instructions (Addendum)
Medication Instructions:   Your physician recommends that you continue on your current medications as directed. Please refer to the Current Medication list given to you today.  *If you need a refill on your cardiac medications before your next appointment, please call your pharmacy*  Lab Work:  None ordered today  Testing/Procedures:  None ordered today  Follow-Up:  On 08/06/20 at 10:20AM with Daneen Schick, MD

## 2020-04-05 ENCOUNTER — Telehealth: Payer: Self-pay | Admitting: Cardiology

## 2020-04-05 NOTE — Telephone Encounter (Signed)
I talked to pt's daughter about eliquis, she is the healthcare POA.  We discussed her past hx of bleeding.  They are very stressed. Due to that severe GI bleed.  [name] will discuss with the pt to decide if she would take it.  Dr. Tamala Julian has recommended 2.5 mg Eliquis BID.  But will leave up to pt.  She will call office 04/06/20 to let us know.

## 2020-04-10 NOTE — Telephone Encounter (Signed)
Spoke with daughter and she states that pt does not want to go on Eliquis without talking to Dr. Tamala Julian and [name] because she is very concerned about bleeding again.  Advised daughter that Dr. Tamala Julian would like for her to go on Eliquis if she felt comfortable.  Pt has appt with [name] on 6/7 and daughter or pt will let us know what she decides once they see him.

## 2020-04-11 DIAGNOSIS — E782 Mixed hyperlipidemia: Secondary | ICD-10-CM | POA: Diagnosis not present

## 2020-04-11 DIAGNOSIS — M169 Osteoarthritis of hip, unspecified: Secondary | ICD-10-CM | POA: Diagnosis not present

## 2020-04-11 DIAGNOSIS — I10 Essential (primary) hypertension: Secondary | ICD-10-CM | POA: Diagnosis not present

## 2020-04-11 DIAGNOSIS — M1990 Unspecified osteoarthritis, unspecified site: Secondary | ICD-10-CM | POA: Diagnosis not present

## 2020-04-11 DIAGNOSIS — E7800 Pure hypercholesterolemia, unspecified: Secondary | ICD-10-CM | POA: Diagnosis not present

## 2020-04-11 DIAGNOSIS — M810 Age-related osteoporosis without current pathological fracture: Secondary | ICD-10-CM | POA: Diagnosis not present

## 2020-04-20 ENCOUNTER — Other Ambulatory Visit: Payer: Self-pay

## 2020-04-20 ENCOUNTER — Ambulatory Visit (INDEPENDENT_AMBULATORY_CARE_PROVIDER_SITE_OTHER): Payer: Medicare Other | Admitting: Pharmacist

## 2020-04-20 DIAGNOSIS — E782 Mixed hyperlipidemia: Secondary | ICD-10-CM | POA: Diagnosis not present

## 2020-04-20 DIAGNOSIS — I70213 Atherosclerosis of native arteries of extremities with intermittent claudication, bilateral legs: Secondary | ICD-10-CM | POA: Diagnosis not present

## 2020-04-20 DIAGNOSIS — I739 Peripheral vascular disease, unspecified: Secondary | ICD-10-CM

## 2020-04-20 LAB — LDL CHOLESTEROL, DIRECT: LDL Direct: 226 mg/dL — ABNORMAL HIGH (ref 0–99)

## 2020-04-20 LAB — LIPID PANEL
Chol/HDL Ratio: 9.1 ratio — ABNORMAL HIGH (ref 0.0–4.4)
Cholesterol, Total: 338 mg/dL — ABNORMAL HIGH (ref 100–199)
HDL: 37 mg/dL — ABNORMAL LOW (ref >39)
LDL Chol Calc (NIH): 212 mg/dL — ABNORMAL HIGH (ref 0–99)
Triglycerides: 420 mg/dL — ABNORMAL HIGH (ref 0–149)
VLDL Cholesterol Cal: 89 mg/dL — ABNORMAL HIGH (ref 5–40)

## 2020-04-20 NOTE — Patient Instructions (Signed)
It was a pleasure seeing you today!  We will get approval from your insurance company to get you started on a medication called either Repatha or Praluent. We will send the prescription in once the insurance approves it and we will call you to discuss the price to see if this will work for you.   Please call us if you have any questions on concerns [phone]

## 2020-04-20 NOTE — Progress Notes (Signed)
Patient ID: [name]                 DOB: Mar 20, 1928                    MRN: [id number]     HPI: [name] is a 86 y.o. female patient referred to lipid clinic by Dr. Dorene Ar. PMH is significant for paroxysmal atrial fibrillation, HTN, HLD, PVD s/p right BKA in 2018, Fem-pop bypass of LLE, who presented with LLE ischemia now s/p LLE thrombectomy of her graft, CVA/TIA, and GERD.    She was initiated on statins "many years ago" and was not able to tolerate any of them. She remembers trying rosuvastatin, atorvastatin, simvastatin, and pravastatin. She has pain in her legs at baseline but endorsed that shortly after starting each statin had severe leg pain and weakness which improved back to baseline after discontinuation. She has also tried ezetimibe and had the same severe leg pain and weakness. She has been off all cholesterol medicine for 5-7 years and wishes to never take a statin or ezetimibe again.   Current Medications: None Intolerances: rosuvastatin 10 mg, atorvastatin 10 mg, simvastatin, pravastatin (severe leg muscle pain and weakness) Risk Factors: PAD, prior CVA/TIA LDL goal: <70  Diet: "eats what she wants to" since she is 89; does not restrict her diet. Admits to eating more sweets recently  Exercise: None (wheelchair bound)  Family History: The patient's family history includes Alcohol abuse in her father; Deep vein thrombosis in her brother; Diabetes in her son; Heart attack in her son; Heart disease in her brother, father, and son; Hyperlipidemia in her brother, father, sister, and son; Hypertension in her brother, father, sister, son, and son.   Social History: The patient reports that she quit smoking about 73 years ago. Her smoking use included cigarettes. She has never used smokeless tobacco. She reports that she does not drink alcohol or use drugs  Labs 08/31/2019: TC 336, HDL 37, LDL 179, TG 518  Past Medical History:  Diagnosis Date  . Anemia   . Anginal  pain ([place])   . Arthritis    "qwhere" (09/05/2016)  . Atrial fibrillation ([place]) 09/2016  . Chronic lower back pain   . Complication of anesthesia    "takes a long time for it to wear off; I can hallucinate if I take too much" (09/05/2016)  . DVT (deep venous thrombosis) ([place]) 10/2009  . Fall from steps 08/31/2013   Fx. pelvis, Left Hip, Left Elbow  . Fibromyalgia   . GERD (gastroesophageal reflux disease)    09/22/16- "no too much anymore"  . GI bleed 10/24/2016  . High cholesterol   . History of blood transfusion   . History of hiatal hernia   . Hypertension   . Macular degeneration, wet ([place])    "started in right eye; now legally blind in that eye; now started in left eye but pretty much in control" (09/05/2016)  . Osteoporosis   . Peripheral vascular disease ([place])    nonviable tissue Right foot  . PONV (postoperative nausea and vomiting)   . Squamous cell carcinoma of skin of right calf Aug. 2015  . Stroke [facility])    TIA's no residual  . TIA (transient ischemic attack)    "several at once; none in a long time" (09/05/2016)    Current Outpatient Medications on File Prior to Visit  Medication Sig Dispense Refill  . acetaminophen (TYLENOL) 500 MG tablet Take 1,000 mg  by mouth 2 (two) times daily as needed for moderate pain or headache. Patient took this medication for her pain.    Marland Kitchen amiodarone (PACERONE) 200 MG tablet Take 1 tablet (200 mg total) by mouth daily. Please keep upcoming appt in May before anymore refills. Final Attempt 30 tablet 0  . Cholecalciferol (VITAMIN D3) 400 units tablet Take 400 Units by mouth at bedtime.     . clopidogrel (PLAVIX) 75 MG tablet Take 1 tablet (75 mg total) by mouth daily. 30 tablet 1  . Cyanocobalamin (VITAMIN B-12) 5000 MCG TBDP Take 5,000 mcg by mouth daily.     . diazepam (VALIUM) 5 MG tablet Take 0.5 tablets (2.5 mg total) by mouth at bedtime as needed for anxiety. 20 tablet 0  . furosemide (LASIX) 20 MG tablet Take 20 mg by mouth every  Monday, Wednesday, and Friday.    . gabapentin (NEURONTIN) 300 MG capsule Take 300 mg by mouth 3 (three) times daily.     Marland Kitchen loperamide (IMODIUM) 2 MG capsule Take 2 mg by mouth as needed for diarrhea or loose stools.    . metoprolol tartrate (LOPRESSOR) 25 MG tablet Take 1 tablet (25 mg total) by mouth at bedtime. 30 tablet 1  . Multiple Vitamins-Minerals (EYE VITAMINS PO) Take 1 tablet by mouth at bedtime.     Marland Kitchen oxycodone (OXY-IR) 5 MG capsule Take 1 capsule (5 mg total) by mouth every 6 (six) hours as needed for pain. 20 capsule 0  . potassium chloride (K-DUR,KLOR-CON) 10 MEQ tablet Take 10 mEq by mouth daily.    . valsartan-hydrochlorothiazide (DIOVAN-HCT) 160-12.5 MG tablet Take 0.5 tablets by mouth daily. 30 tablet 0   No current facility-administered medications on file prior to visit.    Allergies  Allergen Reactions  . Motrin [Ibuprofen] Other (See Comments)    ADVERSE REACTION - GI BLEED  . Statins Other (See Comments)    ADVERSE REACTION MUSCLE PAIN & WEAKNESS  . Eliquis [Apixaban] Other (See Comments)    Lower GI bleeding  . Morphine And Related Other (See Comments)    HALLUCINATIONS REACTION IS SIDE EFFECT  . Promethazine Hcl Other (See Comments)    IV  Drug only, makes her act crazy  . Sulfa Antibiotics Nausea And Vomiting    Assessment/Plan:  1. Hyperlipidemia: - 08/31/2019: LDL uncontrolled at 179 - off all cholesterol medications - Check lipid and direct LDL today - she is not fasting - Discussed retrial of low dose 2-3 times per week statin regimen, retrial of ezetimibe, initiation of PCSK9i, and initiation of Nexletol -Reviewed side effects, LDL reduction and cost of all options - Will initiate PCSK9i based on patient wishes. Reviewed injection technique with patient and daughter - Will start prior authorization process. She may also qualify for [employer]. She will be transitioning to assisted living facility soon.    Kennon Holter, PharmD PGY1  Ambulatory Care Pharmacy Resident

## 2020-04-23 ENCOUNTER — Ambulatory Visit (INDEPENDENT_AMBULATORY_CARE_PROVIDER_SITE_OTHER): Payer: Medicare Other | Admitting: Surgery

## 2020-04-23 ENCOUNTER — Encounter (HOSPITAL_COMMUNITY): Payer: Medicare Other

## 2020-04-23 ENCOUNTER — Other Ambulatory Visit: Payer: Self-pay

## 2020-04-23 ENCOUNTER — Ambulatory Visit (HOSPITAL_COMMUNITY)
Admission: RE | Admit: 2020-04-23 | Discharge: 2020-04-23 | Disposition: A | Discharge status: Home / Self Care | Payer: Medicare Other | Source: Ambulatory Visit | Attending: Vascular Surgery | Admitting: Vascular Surgery

## 2020-04-23 ENCOUNTER — Encounter: Payer: Self-pay | Admitting: Surgery

## 2020-04-23 ENCOUNTER — Ambulatory Visit (INDEPENDENT_AMBULATORY_CARE_PROVIDER_SITE_OTHER)
Admission: RE | Admit: 2020-04-23 | Discharge: 2020-04-23 | Disposition: A | Discharge status: Home / Self Care | Payer: Medicare Other | Source: Ambulatory Visit | Attending: Surgery | Admitting: Surgery

## 2020-04-23 ENCOUNTER — Telehealth: Payer: Self-pay | Admitting: Pharmacist

## 2020-04-23 VITALS — BP 171/74 | HR 71 | Temp 97.8°F | Resp 20 | Ht 62.0 in | Wt 132.0 lb

## 2020-04-23 DIAGNOSIS — I739 Peripheral vascular disease, unspecified: Secondary | ICD-10-CM

## 2020-04-23 DIAGNOSIS — I70213 Atherosclerosis of native arteries of extremities with intermittent claudication, bilateral legs: Secondary | ICD-10-CM

## 2020-04-23 DIAGNOSIS — E785 Hyperlipidemia, unspecified: Secondary | ICD-10-CM

## 2020-04-23 MED ORDER — PRALUENT 75 MG/ML ~~LOC~~ SOAJ: 1.0000 "pen " | SUBCUTANEOUS | 11 refills | Status: DC

## 2020-04-23 NOTE — Telephone Encounter (Signed)
[place] approved for [employer].  Healthwell ID: [id number]  Pharmacy Healthwell Card info: ID: [id number] BIN: [id number] PCN: [id number] GRP: [id number]  $0 copy at pharmacy. Spoke with patient's daughter. She is aware.  Kennon Holter, PharmD PGY1 Ambulatory Care Pharmacy Resident

## 2020-04-23 NOTE — Telephone Encounter (Signed)
[employer] prior authorization approved through 10/23/20.   Kennon Holter, PharmD PGY1 Ambulatory Care Pharmacy Resident [employer] Phone: [phone]

## 2020-04-23 NOTE — Progress Notes (Signed)
[facility] [place]  Patient name: [name] MRN: [id number] DOB: 21-Mar-1928 Sex: female   REASON FOR VISIT:    Follow up  [place]:   [name] a 86 y.o.femalewith multiple revascularization attempts bilaterally as listed below:   1. October 2010:She had an above-knee to below-knee popliteal artery bypass with vein in the left leg.  2. 12/26/2014: She had angioplasty and stenting of the left external iliac artery with angioplasty and stenting of the left superficial femoral artery.  3. 10/30/2015: She had a stent in the left superficial femoral artery and also angioplasty of an in stent stenosis in the left popliteal artery.  4. 04/01/2016: She had atherectomy of the left superficial femoral artery and popliteal artery and also drug coated balloon angioplasty of the left superficial femoral artery and popliteal artery.  5. 09/24/2016: The patient had a redo left femoral to above-knee popliteal artery bypass with a 6 mm PTFE graft. She also had left common femoral artery external iliac artery and profunda femoral endarterectomy.  6. 09/26/2016: The redo femoropopliteal bypass graft failed early and she underwent subintimal angioplasty of the left superficial femoral artery and below-knee popliteal artery stenting with multiple viabahn grafts.   7. 09/27/2016: She had right common femoral artery endarterectomy with bovine pericardial patch angioplasty and right lower extremity thromboembolectomy.  8. 12-23-2016: Right BKA  9. 01-16-2017: Left profunda to AT BPG with PTFEon the right side which ultimately resulted in right below knee amputation.  10:  12-28-2019:  Thrombectomy of left femoral AT bypass   PAST MEDICAL HISTORY:   Past Medical History:  Diagnosis Date  . Anemia   . Anginal pain ([place])   . Arthritis    "qwhere" (09/05/2016)  . Atrial fibrillation ([place]) 09/2016    . Chronic lower back pain   . Complication of anesthesia    "takes a long time for it to wear off; I can hallucinate if I take too much" (09/05/2016)  . DVT (deep venous thrombosis) ([place]) 10/2009  . Fall from steps 08/31/2013   Fx. pelvis, Left Hip, Left Elbow  . Fibromyalgia   . GERD (gastroesophageal reflux disease)    09/22/16- "no too much anymore"  . GI bleed 10/24/2016  . High cholesterol   . History of blood transfusion   . History of hiatal hernia   . Hypertension   . Macular degeneration, wet ([place])    "started in right eye; now legally blind in that eye; now started in left eye but pretty much in control" (09/05/2016)  . Osteoporosis   . Peripheral vascular disease ([place])    nonviable tissue Right foot  . PONV (postoperative nausea and vomiting)   . Squamous cell carcinoma of skin of right calf Aug. 2015  . Stroke ([place])    TIA's no residual  . TIA (transient ischemic attack)    "several at once; none in a long time" (09/05/2016)     FAMILY HISTORY:   Family History  Problem Relation Age of Onset  . Heart disease Father        Heart Disease before age 18  . Hyperlipidemia Father   . Hypertension Father   . Alcohol abuse Father   . Heart disease Brother   . Hyperlipidemia Brother   . Hypertension Brother   . Deep vein thrombosis Brother   . Heart disease Son        Heart Disease before age 86  . Hyperlipidemia Son   .  Hypertension Son   . Heart attack Son   . Diabetes Son   . Hypertension Son   . Hyperlipidemia Sister   . Hypertension Sister     SOCIAL HISTORY:   Social History   Tobacco Use  . Smoking status: Former Smoker    Types: Cigarettes    Quit date: 11/17/1946    Years since quitting: 73.4  . Smokeless tobacco: Never Used  . Tobacco comment: "never smoked much"  Substance Use Topics  . Alcohol use: No    Alcohol/week: 0.0 standard drinks     ALLERGIES:   Allergies  Allergen Reactions  . Motrin [Ibuprofen] Other (See Comments)     ADVERSE REACTION - GI BLEED  . Statins Other (See Comments)    ADVERSE REACTION MUSCLE PAIN & WEAKNESS  . Eliquis [Apixaban] Other (See Comments)    Lower GI bleeding  . Morphine And Related Other (See Comments)    HALLUCINATIONS REACTION IS SIDE EFFECT  . Promethazine Hcl Other (See Comments)    IV  Drug only, makes her act crazy  . Sulfa Antibiotics Nausea And Vomiting     CURRENT MEDICATIONS:   Current Outpatient Medications  Medication Sig Dispense Refill  . acetaminophen (TYLENOL) 500 MG tablet Take 1,000 mg by mouth 2 (two) times daily as needed for moderate pain or headache. Patient took this medication for her pain.    Marland Kitchen amiodarone (PACERONE) 200 MG tablet Take 1 tablet (200 mg total) by mouth daily. Please keep upcoming appt in May before anymore refills. Final Attempt 30 tablet 0  . Cholecalciferol (VITAMIN D3) 400 units tablet Take 400 Units by mouth at bedtime.     . clopidogrel (PLAVIX) 75 MG tablet Take 1 tablet (75 mg total) by mouth daily. 30 tablet 1  . Cyanocobalamin (VITAMIN B-12) 5000 MCG TBDP Take 5,000 mcg by mouth daily.     . diazepam (VALIUM) 5 MG tablet Take 0.5 tablets (2.5 mg total) by mouth at bedtime as needed for anxiety. 20 tablet 0  . furosemide (LASIX) 20 MG tablet Take 20 mg by mouth every Monday, Wednesday, and Friday.    . gabapentin (NEURONTIN) 300 MG capsule Take 300 mg by mouth 3 (three) times daily.     Marland Kitchen loperamide (IMODIUM) 2 MG capsule Take 2 mg by mouth as needed for diarrhea or loose stools.    . metoprolol tartrate (LOPRESSOR) 25 MG tablet Take 1 tablet (25 mg total) by mouth at bedtime. 30 tablet 1  . Multiple Vitamins-Minerals (EYE VITAMINS PO) Take 1 tablet by mouth at bedtime.     Marland Kitchen oxycodone (OXY-IR) 5 MG capsule Take 1 capsule (5 mg total) by mouth every 6 (six) hours as needed for pain. 20 capsule 0  . potassium chloride (K-DUR,KLOR-CON) 10 MEQ tablet Take 10 mEq by mouth daily.    . valsartan-hydrochlorothiazide (DIOVAN-HCT)  160-12.5 MG tablet Take 0.5 tablets by mouth daily. 30 tablet 0   No current facility-administered medications for this visit.    REVIEW OF SYSTEMS:   [X]  denotes positive finding, [ ]  denotes negative finding Cardiac  Comments:  Chest pain or chest pressure:    Shortness of breath upon exertion:    Short of breath when lying flat:    Irregular heart rhythm:        Vascular    Pain in calf, thigh, or hip brought on by ambulation:    Pain in feet at night that wakes you up from your sleep:  Blood clot in your veins:    Leg swelling:         Pulmonary    Oxygen at home:    Productive cough:     Wheezing:         Neurologic    Sudden weakness in arms or legs:     Sudden numbness in arms or legs:     Sudden onset of difficulty speaking or slurred speech:    Temporary loss of vision in one eye:     Problems with dizziness:         Gastrointestinal    Blood in stool:     Vomited blood:         Genitourinary    Burning when urinating:     Blood in urine:        Psychiatric    Major depression:         Hematologic    Bleeding problems:    Problems with blood clotting too easily:        Skin    Rashes or ulcers:        Constitutional    Fever or chills:      PHYSICAL EXAM:   Vitals:   04/23/20 1056  BP: (!) 171/74  Pulse: 71  Resp: 20  Temp: 97.8 F (36.6 C)  SpO2: 95%  Weight: 132 lb (59.9 kg)  Height: 5\' 2"  (1.575 m)    GENERAL: The patient is a well-nourished female, in no acute distress. The vital signs are documented above. CARDIAC: There is a regular rate and rhythm.  VASCULAR: faintly palpable left ATA PULMONARY: Non-labored respirations ABDOMEN: Soft and non-tender with normal pitched bowel sounds.  MUSCULOSKELETAL: There are no major deformities or cyanosis. NEUROLOGIC: No focal weakness or paresthesias are detected. SKIN: There are no ulcers or rashes noted. PSYCHIATRIC: The patient has a normal affect.  STUDIES:   I have reviewed  the following: Left: Patent left femoral to tibial artery bypass graft. Elevated  velocities of 441 cm/s in the proximal anastamosis suggest stenosis of  >70%.   +-------+-----------+----------------+------------+------------+  ABI/TBIToday's ABIToday's TBI   Previous ABIPrevious TBI  +-------+-----------+----------------+------------+------------+  Right BKA    BKA       BKA     BKA       +-------+-----------+----------------+------------+------------+  Left  0.82    Unable to Obtain0.72    0.39      +-------+-----------+----------------+------------+------------+  Left Graft #1: Deep Femoral Artery to Anterior Tibial Artery Bypass Graft  +--------------------+--------+-------------+----------+--------+            PSV cm/sStenosis   Waveform Comments  +--------------------+--------+-------------+----------+--------+  Inflow       104          monophasic      +--------------------+--------+-------------+----------+--------+  Proximal Anastomosis441   >70% stenosismonophasic      +--------------------+--------+-------------+----------+--------+  Proximal Graft   17          monophasic      +--------------------+--------+-------------+----------+--------+  Mid Graft      28          monophasic      +--------------------+--------+-------------+----------+--------+  Distal Graft    31          monophasic      +--------------------+--------+-------------+----------+--------+  Distal Anastomosis 31          monophasic      +--------------------+--------+-------------+----------+--------+  Outflow       24          monophasic      +--------------------+--------+-------------+----------+--------+  MEDICAL ISSUES:   The patient recently underwent thrombectomy of her left  femoral tibial bypass graft.  She is recovered nicely from that.  Her ultrasound today suggested inflow stenosis with low velocities in the bypass graft and anterior tibial artery.  In addition, I have difficulty palpating a dorsalis pedis pulse.  I think she needs to undergo angiography via a right femoral approach, and intervention in order to prevent recurrent  thrombosis of her graft.  This is been scheduled for Tuesday, June 29.    Leia Alf, MD, FACS [facility] [phone] Pager [phone]

## 2020-05-04 ENCOUNTER — Emergency Department (HOSPITAL_COMMUNITY): Payer: Medicare Other

## 2020-05-04 ENCOUNTER — Other Ambulatory Visit: Payer: Self-pay

## 2020-05-04 ENCOUNTER — Emergency Department (HOSPITAL_COMMUNITY): Payer: Medicare Other | Admitting: Certified Registered"

## 2020-05-04 ENCOUNTER — Encounter (HOSPITAL_COMMUNITY): Payer: Self-pay | Admitting: Pediatrics

## 2020-05-04 ENCOUNTER — Inpatient Hospital Stay (HOSPITAL_COMMUNITY)
Admission: EM | Admit: 2020-05-04 | Discharge: 2020-05-09 | DRG: 271 | Disposition: A | Discharge status: Home Health | Payer: Medicare Other | Attending: Surgery | Admitting: Surgery

## 2020-05-04 ENCOUNTER — Encounter (HOSPITAL_COMMUNITY)
Admission: EM | Disposition: A | Discharge status: Home Health | Payer: Self-pay | Source: Home / Self Care | Attending: Surgery

## 2020-05-04 DIAGNOSIS — Z79899 Other long term (current) drug therapy: Secondary | ICD-10-CM | POA: Diagnosis not present

## 2020-05-04 DIAGNOSIS — M797 Fibromyalgia: Secondary | ICD-10-CM | POA: Diagnosis not present

## 2020-05-04 DIAGNOSIS — Z95820 Peripheral vascular angioplasty status with implants and grafts: Secondary | ICD-10-CM | POA: Diagnosis not present

## 2020-05-04 DIAGNOSIS — Z882 Allergy status to sulfonamides status: Secondary | ICD-10-CM

## 2020-05-04 DIAGNOSIS — Z85828 Personal history of other malignant neoplasm of skin: Secondary | ICD-10-CM

## 2020-05-04 DIAGNOSIS — Z886 Allergy status to analgesic agent status: Secondary | ICD-10-CM

## 2020-05-04 DIAGNOSIS — M810 Age-related osteoporosis without current pathological fracture: Secondary | ICD-10-CM | POA: Diagnosis not present

## 2020-05-04 DIAGNOSIS — Z89511 Acquired absence of right leg below knee: Secondary | ICD-10-CM | POA: Diagnosis not present

## 2020-05-04 DIAGNOSIS — D62 Acute posthemorrhagic anemia: Secondary | ICD-10-CM | POA: Diagnosis not present

## 2020-05-04 DIAGNOSIS — T82868A Thrombosis of vascular prosthetic devices, implants and grafts, initial encounter: Principal | ICD-10-CM | POA: Diagnosis present

## 2020-05-04 DIAGNOSIS — Z87891 Personal history of nicotine dependence: Secondary | ICD-10-CM | POA: Diagnosis not present

## 2020-05-04 DIAGNOSIS — H5461 Unqualified visual loss, right eye, normal vision left eye: Secondary | ICD-10-CM | POA: Diagnosis present

## 2020-05-04 DIAGNOSIS — I110 Hypertensive heart disease with heart failure: Secondary | ICD-10-CM | POA: Diagnosis not present

## 2020-05-04 DIAGNOSIS — Z86718 Personal history of other venous thrombosis and embolism: Secondary | ICD-10-CM

## 2020-05-04 DIAGNOSIS — I70222 Atherosclerosis of native arteries of extremities with rest pain, left leg: Secondary | ICD-10-CM | POA: Diagnosis not present

## 2020-05-04 DIAGNOSIS — Z20822 Contact with and (suspected) exposure to covid-19: Secondary | ICD-10-CM | POA: Diagnosis present

## 2020-05-04 DIAGNOSIS — Z7902 Long term (current) use of antithrombotics/antiplatelets: Secondary | ICD-10-CM

## 2020-05-04 DIAGNOSIS — K219 Gastro-esophageal reflux disease without esophagitis: Secondary | ICD-10-CM | POA: Diagnosis not present

## 2020-05-04 DIAGNOSIS — Z96622 Presence of left artificial elbow joint: Secondary | ICD-10-CM | POA: Diagnosis present

## 2020-05-04 DIAGNOSIS — Z8673 Personal history of transient ischemic attack (TIA), and cerebral infarction without residual deficits: Secondary | ICD-10-CM

## 2020-05-04 DIAGNOSIS — I998 Other disorder of circulatory system: Secondary | ICD-10-CM | POA: Diagnosis present

## 2020-05-04 DIAGNOSIS — Z8249 Family history of ischemic heart disease and other diseases of the circulatory system: Secondary | ICD-10-CM

## 2020-05-04 DIAGNOSIS — Z888 Allergy status to other drugs, medicaments and biological substances status: Secondary | ICD-10-CM

## 2020-05-04 DIAGNOSIS — I4891 Unspecified atrial fibrillation: Secondary | ICD-10-CM

## 2020-05-04 DIAGNOSIS — Z83438 Family history of other disorder of lipoprotein metabolism and other lipidemia: Secondary | ICD-10-CM | POA: Diagnosis not present

## 2020-05-04 DIAGNOSIS — Z885 Allergy status to narcotic agent status: Secondary | ICD-10-CM

## 2020-05-04 DIAGNOSIS — Z96652 Presence of left artificial knee joint: Secondary | ICD-10-CM | POA: Diagnosis present

## 2020-05-04 DIAGNOSIS — I739 Peripheral vascular disease, unspecified: Secondary | ICD-10-CM

## 2020-05-04 DIAGNOSIS — H353210 Exudative age-related macular degeneration, right eye, stage unspecified: Secondary | ICD-10-CM | POA: Diagnosis present

## 2020-05-04 DIAGNOSIS — E7800 Pure hypercholesterolemia, unspecified: Secondary | ICD-10-CM | POA: Diagnosis present

## 2020-05-04 DIAGNOSIS — I5042 Chronic combined systolic (congestive) and diastolic (congestive) heart failure: Secondary | ICD-10-CM | POA: Diagnosis not present

## 2020-05-04 DIAGNOSIS — Y712 Prosthetic and other implants, materials and accessory cardiovascular devices associated with adverse incidents: Secondary | ICD-10-CM | POA: Diagnosis present

## 2020-05-04 DIAGNOSIS — Z90710 Acquired absence of both cervix and uterus: Secondary | ICD-10-CM | POA: Diagnosis not present

## 2020-05-04 DIAGNOSIS — I70209 Unspecified atherosclerosis of native arteries of extremities, unspecified extremity: Secondary | ICD-10-CM

## 2020-05-04 DIAGNOSIS — Z833 Family history of diabetes mellitus: Secondary | ICD-10-CM | POA: Diagnosis not present

## 2020-05-04 DIAGNOSIS — M79605 Pain in left leg: Secondary | ICD-10-CM

## 2020-05-04 DIAGNOSIS — I8290 Acute embolism and thrombosis of unspecified vein: Secondary | ICD-10-CM | POA: Diagnosis present

## 2020-05-04 HISTORY — PX: THROMBECTOMY FEMORAL ARTERY: SHX6406

## 2020-05-04 HISTORY — PX: PATCH ANGIOPLASTY: SHX6230

## 2020-05-04 HISTORY — PX: INSERTION OF ILIAC STENT: SHX6256

## 2020-05-04 LAB — CBC WITH DIFFERENTIAL/PLATELET
Abs Immature Granulocytes: 0.14 10*3/uL — ABNORMAL HIGH (ref 0.00–0.07)
Basophils Absolute: 0.1 10*3/uL (ref 0.0–0.1)
Basophils Relative: 1 %
Eosinophils Absolute: 0.3 10*3/uL (ref 0.0–0.5)
Eosinophils Relative: 3 %
HCT: 39.1 % (ref 36.0–46.0)
Hemoglobin: 12.3 g/dL (ref 12.0–15.0)
Immature Granulocytes: 2 %
Lymphocytes Relative: 37 %
Lymphs Abs: 3 10*3/uL (ref 0.7–4.0)
MCH: 30 pg (ref 26.0–34.0)
MCHC: 31.5 g/dL (ref 30.0–36.0)
MCV: 95.4 fL (ref 80.0–100.0)
Monocytes Absolute: 0.8 10*3/uL (ref 0.1–1.0)
Monocytes Relative: 10 %
Neutro Abs: 3.9 10*3/uL (ref 1.7–7.7)
Neutrophils Relative %: 47 %
Platelets: 223 10*3/uL (ref 150–400)
RBC: 4.1 MIL/uL (ref 3.87–5.11)
RDW: 15 % (ref 11.5–15.5)
WBC: 8.1 10*3/uL (ref 4.0–10.5)
nRBC: 0 % (ref 0.0–0.2)

## 2020-05-04 LAB — BASIC METABOLIC PANEL WITH GFR
Anion gap: 12 (ref 5–15)
BUN: 38 mg/dL — ABNORMAL HIGH (ref 8–23)
CO2: 24 mmol/L (ref 22–32)
Calcium: 9.2 mg/dL (ref 8.9–10.3)
Chloride: 101 mmol/L (ref 98–111)
Creatinine, Ser: 1.48 mg/dL — ABNORMAL HIGH (ref 0.44–1.00)
GFR calc Af Amer: 36 mL/min — ABNORMAL LOW (ref >60)
GFR calc non Af Amer: 31 mL/min — ABNORMAL LOW (ref >60)
Glucose, Bld: 97 mg/dL (ref 70–99)
Potassium: 4.2 mmol/L (ref 3.5–5.1)
Sodium: 137 mmol/L (ref 135–145)

## 2020-05-04 LAB — PROTIME-INR
INR: 1 (ref 0.8–1.2)
Prothrombin Time: 13.2 seconds (ref 11.4–15.2)

## 2020-05-04 LAB — SURGICAL PCR SCREEN
MRSA, PCR: NEGATIVE
Staphylococcus aureus: NEGATIVE

## 2020-05-04 LAB — TYPE AND SCREEN
ABO/RH(D): O POS
Antibody Screen: NEGATIVE

## 2020-05-04 LAB — SARS CORONAVIRUS 2 BY RT PCR (HOSPITAL ORDER, PERFORMED IN ~~LOC~~ HOSPITAL LAB): SARS Coronavirus 2: NEGATIVE

## 2020-05-04 SURGERY — THROMBECTOMY, ARTERY, FEMORAL
Anesthesia: General | Site: Leg Upper | Laterality: Left

## 2020-05-04 MED ORDER — PROTAMINE SULFATE 10 MG/ML IV SOLN
INTRAVENOUS | Status: DC | PRN
Start: 2020-05-04 — End: 2020-05-04
  Administered 2020-05-04: 40 mg via INTRAVENOUS

## 2020-05-04 MED ORDER — OXYCODONE-ACETAMINOPHEN 5-325 MG PO TABS
1.0000 | ORAL_TABLET | ORAL | Status: DC | PRN
  Administered 2020-05-04 – 2020-05-06 (×2): 2 via ORAL
  Administered 2020-05-07: 1 via ORAL
  Administered 2020-05-08: 2 via ORAL
  Filled 2020-05-04 (×3): qty 2
  Filled 2020-05-04: qty 1

## 2020-05-04 MED ORDER — HYDROMORPHONE HCL 1 MG/ML IJ SOLN: 0.5000 mg | INTRAMUSCULAR | Status: DC | PRN

## 2020-05-04 MED ORDER — GUAIFENESIN-DM 100-10 MG/5ML PO SYRP: 15.0000 mL | ORAL_SOLUTION | ORAL | Status: DC | PRN

## 2020-05-04 MED ORDER — DEXAMETHASONE SODIUM PHOSPHATE 10 MG/ML IJ SOLN
INTRAMUSCULAR | Status: DC | PRN
  Administered 2020-05-04: 4 mg via INTRAVENOUS

## 2020-05-04 MED ORDER — ROCURONIUM BROMIDE 10 MG/ML (PF) SYRINGE
PREFILLED_SYRINGE | INTRAVENOUS | Status: DC | PRN
  Administered 2020-05-04: 50 mg via INTRAVENOUS

## 2020-05-04 MED ORDER — HEPARIN (PORCINE) 25000 UT/250ML-% IV SOLN
700.0000 [IU]/h | INTRAVENOUS | Status: DC
  Administered 2020-05-04: 700 [IU]/h via INTRAVENOUS
  Filled 2020-05-04: qty 250

## 2020-05-04 MED ORDER — SODIUM CHLORIDE 0.9 % IV SOLN: INTRAVENOUS | Status: AC

## 2020-05-04 MED ORDER — ACETAMINOPHEN 650 MG RE SUPP: 325.0000 mg | RECTAL | Status: DC | PRN

## 2020-05-04 MED ORDER — AMIODARONE HCL 200 MG PO TABS
200.0000 mg | ORAL_TABLET | Freq: Every day | ORAL | Status: DC
  Administered 2020-05-05 – 2020-05-09 (×5): 200 mg via ORAL
  Filled 2020-05-04 (×5): qty 1

## 2020-05-04 MED ORDER — OXYCODONE HCL 5 MG/5ML PO SOLN: 5.0000 mg | Freq: Once | ORAL | Status: DC | PRN

## 2020-05-04 MED ORDER — HEMOSTATIC AGENTS (NO CHARGE) OPTIME
TOPICAL | Status: DC | PRN
  Administered 2020-05-04: 1 via TOPICAL

## 2020-05-04 MED ORDER — 0.9 % SODIUM CHLORIDE (POUR BTL) OPTIME
TOPICAL | Status: DC | PRN
  Administered 2020-05-04: 1000 mL

## 2020-05-04 MED ORDER — SUGAMMADEX SODIUM 200 MG/2ML IV SOLN
INTRAVENOUS | Status: DC | PRN
  Administered 2020-05-04: 120 mg via INTRAVENOUS

## 2020-05-04 MED ORDER — ALUM & MAG HYDROXIDE-SIMETH 200-200-20 MG/5ML PO SUSP
15.0000 mL | ORAL | Status: DC | PRN
  Administered 2020-05-09: 30 mL via ORAL
  Filled 2020-05-04: qty 30

## 2020-05-04 MED ORDER — SODIUM CHLORIDE 0.9 % IV SOLN
INTRAVENOUS | Status: AC
  Filled 2020-05-04: qty 1.2

## 2020-05-04 MED ORDER — NITROGLYCERIN FOR UTERINE RELAXATION OPTIME 200MCG/ML
INTRAVENOUS | Status: DC | PRN
  Administered 2020-05-04: 800 ug via INTRAVENOUS

## 2020-05-04 MED ORDER — PHENOL 1.4 % MT LIQD: 1.0000 | OROMUCOSAL | Status: DC | PRN

## 2020-05-04 MED ORDER — HEPARIN SODIUM (PORCINE) 1000 UNIT/ML IJ SOLN
INTRAMUSCULAR | Status: DC | PRN
Start: 2020-05-04 — End: 2020-05-04
  Administered 2020-05-04: 6000 [IU] via INTRAVENOUS
  Administered 2020-05-04: 3000 [IU] via INTRAVENOUS

## 2020-05-04 MED ORDER — ACETAMINOPHEN 10 MG/ML IV SOLN
INTRAVENOUS | Status: AC
  Filled 2020-05-04: qty 100

## 2020-05-04 MED ORDER — VALSARTAN-HYDROCHLOROTHIAZIDE 160-12.5 MG PO TABS: 0.5000 | ORAL_TABLET | Freq: Every day | ORAL | Status: DC

## 2020-05-04 MED ORDER — PHENYLEPHRINE 40 MCG/ML (10ML) SYRINGE FOR IV PUSH (FOR BLOOD PRESSURE SUPPORT)
PREFILLED_SYRINGE | INTRAVENOUS | Status: DC | PRN
  Administered 2020-05-04 (×2): 80 ug via INTRAVENOUS

## 2020-05-04 MED ORDER — FENTANYL CITRATE (PF) 100 MCG/2ML IJ SOLN
INTRAMUSCULAR | Status: AC
  Filled 2020-05-04: qty 2

## 2020-05-04 MED ORDER — HYDRALAZINE HCL 20 MG/ML IJ SOLN: 5.0000 mg | INTRAMUSCULAR | Status: DC | PRN

## 2020-05-04 MED ORDER — MICROFIBRILLAR COLL HEMOSTAT EX POWD
CUTANEOUS | Status: DC | PRN
Start: 2020-05-04 — End: 2020-05-04
  Administered 2020-05-04: 3 g via TOPICAL

## 2020-05-04 MED ORDER — ACETAMINOPHEN 160 MG/5ML PO SOLN: 1000.0000 mg | Freq: Once | ORAL | Status: DC | PRN

## 2020-05-04 MED ORDER — HYDROCHLOROTHIAZIDE 10 MG/ML ORAL SUSPENSION
6.2500 mg | Freq: Every day | ORAL | Status: DC
  Administered 2020-05-06 – 2020-05-09 (×4): 6.25 mg via ORAL
  Filled 2020-05-04 (×4): qty 1.25

## 2020-05-04 MED ORDER — FENTANYL CITRATE (PF) 250 MCG/5ML IJ SOLN
INTRAMUSCULAR | Status: AC
  Filled 2020-05-04: qty 5

## 2020-05-04 MED ORDER — LACTATED RINGERS IV SOLN: INTRAVENOUS | Status: DC | PRN

## 2020-05-04 MED ORDER — ONDANSETRON HCL 4 MG/2ML IJ SOLN: 4.0000 mg | Freq: Four times a day (QID) | INTRAMUSCULAR | Status: DC | PRN

## 2020-05-04 MED ORDER — FENTANYL CITRATE (PF) 100 MCG/2ML IJ SOLN
25.0000 ug | INTRAMUSCULAR | Status: DC | PRN
  Administered 2020-05-04 (×3): 25 ug via INTRAVENOUS

## 2020-05-04 MED ORDER — MIDAZOLAM HCL 2 MG/2ML IJ SOLN
INTRAMUSCULAR | Status: AC
  Filled 2020-05-04: qty 2

## 2020-05-04 MED ORDER — GABAPENTIN 300 MG PO CAPS
300.0000 mg | ORAL_CAPSULE | Freq: Three times a day (TID) | ORAL | Status: DC
  Administered 2020-05-04 – 2020-05-09 (×14): 300 mg via ORAL
  Filled 2020-05-04 (×14): qty 1

## 2020-05-04 MED ORDER — PANTOPRAZOLE SODIUM 40 MG PO TBEC
40.0000 mg | DELAYED_RELEASE_TABLET | Freq: Every day | ORAL | Status: DC
  Administered 2020-05-04 – 2020-05-09 (×6): 40 mg via ORAL
  Filled 2020-05-04 (×6): qty 1

## 2020-05-04 MED ORDER — ACETAMINOPHEN 500 MG PO TABS: 1000.0000 mg | ORAL_TABLET | Freq: Once | ORAL | Status: DC | PRN

## 2020-05-04 MED ORDER — LIDOCAINE 2% (20 MG/ML) 5 ML SYRINGE
INTRAMUSCULAR | Status: DC | PRN
  Administered 2020-05-04: 40 mg via INTRAVENOUS

## 2020-05-04 MED ORDER — OXYCODONE HCL 5 MG PO TABS: 5.0000 mg | ORAL_TABLET | Freq: Once | ORAL | Status: DC | PRN

## 2020-05-04 MED ORDER — CEFAZOLIN SODIUM-DEXTROSE 2-4 GM/100ML-% IV SOLN
2.0000 g | Freq: Three times a day (TID) | INTRAVENOUS | Status: AC
  Administered 2020-05-04 – 2020-05-05 (×2): 2 g via INTRAVENOUS
  Filled 2020-05-04 (×2): qty 100

## 2020-05-04 MED ORDER — MAGNESIUM SULFATE 2 GM/50ML IV SOLN: 2.0000 g | Freq: Every day | INTRAVENOUS | Status: DC | PRN

## 2020-05-04 MED ORDER — LABETALOL HCL 5 MG/ML IV SOLN: 10.0000 mg | INTRAVENOUS | Status: DC | PRN

## 2020-05-04 MED ORDER — POTASSIUM CHLORIDE CRYS ER 20 MEQ PO TBCR: 20.0000 meq | EXTENDED_RELEASE_TABLET | Freq: Every day | ORAL | Status: DC | PRN

## 2020-05-04 MED ORDER — CEFAZOLIN SODIUM-DEXTROSE 2-3 GM-%(50ML) IV SOLR
INTRAVENOUS | Status: DC | PRN
Start: 2020-05-04 — End: 2020-05-04
  Administered 2020-05-04: 2 g via INTRAVENOUS

## 2020-05-04 MED ORDER — FENTANYL CITRATE (PF) 100 MCG/2ML IJ SOLN
50.0000 ug | Freq: Once | INTRAMUSCULAR | Status: AC
  Administered 2020-05-04: 50 ug via INTRAVENOUS
  Filled 2020-05-04: qty 2

## 2020-05-04 MED ORDER — PHENYLEPHRINE HCL-NACL 10-0.9 MG/250ML-% IV SOLN
INTRAVENOUS | Status: DC | PRN
  Administered 2020-05-04: 40 ug/min via INTRAVENOUS

## 2020-05-04 MED ORDER — ACETAMINOPHEN 325 MG PO TABS
325.0000 mg | ORAL_TABLET | ORAL | Status: DC | PRN
  Administered 2020-05-07 – 2020-05-08 (×2): 650 mg via ORAL
  Filled 2020-05-04 (×2): qty 2

## 2020-05-04 MED ORDER — HEPARIN (PORCINE) 25000 UT/250ML-% IV SOLN
600.0000 [IU]/h | INTRAVENOUS | Status: DC
  Administered 2020-05-04: 600 [IU]/h via INTRAVENOUS
  Filled 2020-05-04: qty 250

## 2020-05-04 MED ORDER — METOPROLOL TARTRATE 5 MG/5ML IV SOLN: 2.0000 mg | INTRAVENOUS | Status: DC | PRN

## 2020-05-04 MED ORDER — ACETAMINOPHEN 10 MG/ML IV SOLN
1000.0000 mg | Freq: Once | INTRAVENOUS | Status: DC | PRN
  Administered 2020-05-04: 1000 mg via INTRAVENOUS

## 2020-05-04 MED ORDER — LOPERAMIDE HCL 2 MG PO CAPS
2.0000 mg | ORAL_CAPSULE | ORAL | Status: DC | PRN
  Administered 2020-05-09: 2 mg via ORAL
  Filled 2020-05-04: qty 1

## 2020-05-04 MED ORDER — CEFAZOLIN SODIUM-DEXTROSE 2-4 GM/100ML-% IV SOLN
INTRAVENOUS | Status: AC
  Filled 2020-05-04: qty 100

## 2020-05-04 MED ORDER — SODIUM CHLORIDE 0.9 % IV SOLN
INTRAVENOUS | Status: DC | PRN
  Administered 2020-05-04: 500 mL

## 2020-05-04 MED ORDER — ONDANSETRON HCL 4 MG/2ML IJ SOLN
INTRAMUSCULAR | Status: DC | PRN
  Administered 2020-05-04: 4 mg via INTRAVENOUS

## 2020-05-04 MED ORDER — IRBESARTAN 150 MG PO TABS
75.0000 mg | ORAL_TABLET | Freq: Every day | ORAL | Status: DC
  Administered 2020-05-07 – 2020-05-09 (×3): 75 mg via ORAL
  Filled 2020-05-04 (×4): qty 1

## 2020-05-04 MED ORDER — METOPROLOL TARTRATE 25 MG PO TABS
25.0000 mg | ORAL_TABLET | Freq: Every day | ORAL | Status: DC
  Administered 2020-05-04 – 2020-05-08 (×5): 25 mg via ORAL
  Filled 2020-05-04 (×5): qty 1

## 2020-05-04 MED ORDER — SODIUM CHLORIDE 0.9 % IV SOLN: 500.0000 mL | Freq: Once | INTRAVENOUS | Status: DC | PRN

## 2020-05-04 MED ORDER — GABAPENTIN 300 MG PO CAPS: 300.0000 mg | ORAL_CAPSULE | Freq: Every evening | ORAL | Status: DC | PRN

## 2020-05-04 MED ORDER — ALBUMIN HUMAN 5 % IV SOLN: INTRAVENOUS | Status: DC | PRN

## 2020-05-04 MED ORDER — DIAZEPAM 5 MG PO TABS
2.5000 mg | ORAL_TABLET | Freq: Every evening | ORAL | Status: DC | PRN
  Administered 2020-05-05 – 2020-05-08 (×2): 2.5 mg via ORAL
  Filled 2020-05-04 (×2): qty 1

## 2020-05-04 MED ORDER — DOCUSATE SODIUM 100 MG PO CAPS
100.0000 mg | ORAL_CAPSULE | Freq: Every day | ORAL | Status: DC
  Administered 2020-05-06 – 2020-05-09 (×4): 100 mg via ORAL
  Filled 2020-05-04 (×5): qty 1

## 2020-05-04 MED ORDER — CLOPIDOGREL BISULFATE 75 MG PO TABS
75.0000 mg | ORAL_TABLET | Freq: Every day | ORAL | Status: DC
  Administered 2020-05-05 – 2020-05-09 (×5): 75 mg via ORAL
  Filled 2020-05-04 (×5): qty 1

## 2020-05-04 MED ORDER — FENTANYL CITRATE (PF) 250 MCG/5ML IJ SOLN
INTRAMUSCULAR | Status: DC | PRN
  Administered 2020-05-04: 25 ug via INTRAVENOUS
  Administered 2020-05-04: 50 ug via INTRAVENOUS
  Administered 2020-05-04: 25 ug via INTRAVENOUS

## 2020-05-04 MED ORDER — PROPOFOL 10 MG/ML IV BOLUS
INTRAVENOUS | Status: DC | PRN
  Administered 2020-05-04: 20 mg via INTRAVENOUS
  Administered 2020-05-04: 80 mg via INTRAVENOUS
  Administered 2020-05-04: 40 mg via INTRAVENOUS

## 2020-05-04 SURGICAL SUPPLY — 83 items
ADH SKN CLS APL DERMABOND .7 (GAUZE/BANDAGES/DRESSINGS) ×4
BALLN MUSTANG 5.0X40 135 (BALLOONS) ×3
BALLN STERLING OTW 2X150X150 (BALLOONS) ×3
BALLN STERLING OTW 3X150X150 (BALLOONS) ×3
BALLN STERLING RX 3X40X135 (BALLOONS) ×3
BALLOON MUSTANG 5.0X40 135 (BALLOONS) IMPLANT
BALLOON STERLING OTW 2X150X150 (BALLOONS) IMPLANT
BALLOON STERLING OTW 3X150X150 (BALLOONS) IMPLANT
BALLOON STERLING RX 3X40X135 (BALLOONS) IMPLANT
BANDAGE ESMARK 6X9 LF (GAUZE/BANDAGES/DRESSINGS) IMPLANT
BNDG CMPR 9X6 STRL LF SNTH (GAUZE/BANDAGES/DRESSINGS)
BNDG ELASTIC 4X5.8 VLCR STR LF (GAUZE/BANDAGES/DRESSINGS) IMPLANT
BNDG ESMARK 6X9 LF (GAUZE/BANDAGES/DRESSINGS)
CANISTER SUCT 3000ML PPV (MISCELLANEOUS) ×3 IMPLANT
CATH ANGIO 5F BER2 100CM (CATHETERS) ×1 IMPLANT
CATH BEACON 5 .035 65 KMP TIP (CATHETERS) ×1 IMPLANT
CATH EMB 3FR 80CM (CATHETERS) ×1 IMPLANT
CATH EMB 4FR 80CM (CATHETERS) ×2 IMPLANT
CATH EMB 5FR 80CM (CATHETERS) ×3 IMPLANT
CATH OMNI FLUSH 5F 65CM (CATHETERS) ×1 IMPLANT
CLIP VESOCCLUDE MED 24/CT (CLIP) ×3 IMPLANT
CLIP VESOCCLUDE SM WIDE 24/CT (CLIP) ×3 IMPLANT
COVER DOME SNAP 22 D (MISCELLANEOUS) ×1 IMPLANT
COVER PROBE W GEL 5X96 (DRAPES) ×1 IMPLANT
CUFF TOURN SGL QUICK 24 (TOURNIQUET CUFF)
CUFF TOURN SGL QUICK 34 (TOURNIQUET CUFF)
CUFF TOURN SGL QUICK 42 (TOURNIQUET CUFF) IMPLANT
CUFF TRNQT CYL 24X4X16.5-23 (TOURNIQUET CUFF) IMPLANT
CUFF TRNQT CYL 34X4.125X (TOURNIQUET CUFF) IMPLANT
DERMABOND ADVANCED (GAUZE/BANDAGES/DRESSINGS) ×2
DERMABOND ADVANCED .7 DNX12 (GAUZE/BANDAGES/DRESSINGS) ×2 IMPLANT
DEVICE CLOSURE PERCLS PRGLD 6F (VASCULAR PRODUCTS) IMPLANT
DEVICE TORQUE H2O (MISCELLANEOUS) ×1 IMPLANT
DRAIN CHANNEL 15F RND FF W/TCR (WOUND CARE) IMPLANT
DRAPE BRACHIAL (DRAPES) ×1 IMPLANT
DRSG COVADERM 4X14 (GAUZE/BANDAGES/DRESSINGS) ×1 IMPLANT
ELECT REM PT RETURN 9FT ADLT (ELECTROSURGICAL) ×3
ELECTRODE REM PT RTRN 9FT ADLT (ELECTROSURGICAL) ×2 IMPLANT
EVACUATOR SILICONE 100CC (DRAIN) IMPLANT
GLOVE BIOGEL PI IND STRL 7.5 (GLOVE) ×2 IMPLANT
GLOVE BIOGEL PI INDICATOR 7.5 (GLOVE) ×1
GLOVE SURG SS PI 7.5 STRL IVOR (GLOVE) ×3 IMPLANT
GOWN STRL REUS W/ TWL LRG LVL3 (GOWN DISPOSABLE) ×4 IMPLANT
GOWN STRL REUS W/ TWL XL LVL3 (GOWN DISPOSABLE) ×2 IMPLANT
GOWN STRL REUS W/TWL LRG LVL3 (GOWN DISPOSABLE) ×3
GOWN STRL REUS W/TWL XL LVL3 (GOWN DISPOSABLE) ×6
GUIDEWIRE ANGLED .035X150CM (WIRE) ×1 IMPLANT
HEMOSTAT SNOW SURGICEL 2X4 (HEMOSTASIS) ×1 IMPLANT
KIT BASIN OR (CUSTOM PROCEDURE TRAY) ×3 IMPLANT
KIT ENCORE 26 ADVANTAGE (KITS) ×1 IMPLANT
KIT TURNOVER KIT B (KITS) ×3 IMPLANT
NS IRRIG 1000ML POUR BTL (IV SOLUTION) ×6 IMPLANT
PACK PERIPHERAL VASCULAR (CUSTOM PROCEDURE TRAY) ×3 IMPLANT
PAD ARMBOARD 7.5X6 YLW CONV (MISCELLANEOUS) ×6 IMPLANT
PERCLOSE PROGLIDE 6F (VASCULAR PRODUCTS) ×3
POWDER SURGICEL 3.0 GRAM (HEMOSTASIS) ×1 IMPLANT
SET COLLECT BLD 21X3/4 12 (NEEDLE) IMPLANT
SET MICROPUNCTURE 5F STIFF (MISCELLANEOUS) ×1 IMPLANT
SHEATH PINNACLE 5F 10CM (SHEATH) ×1 IMPLANT
SHEATH PINNACLE ST 7F 45CM (SHEATH) ×1 IMPLANT
STENT VIABAHN 6X50X120 (Permanent Stent) ×1 IMPLANT
STOPCOCK 4 WAY LG BORE MALE ST (IV SETS) IMPLANT
STOPCOCK MORSE 400PSI 3WAY (MISCELLANEOUS) ×1 IMPLANT
SUT ETHILON 3 0 PS 1 (SUTURE) IMPLANT
SUT PROLENE 5 0 C 1 24 (SUTURE) ×5 IMPLANT
SUT PROLENE 6 0 BV (SUTURE) ×6 IMPLANT
SUT PROLENE 7 0 BV 1 (SUTURE) IMPLANT
SUT SILK 2 0 SH (SUTURE) ×3 IMPLANT
SUT SILK 3 0 (SUTURE)
SUT SILK 3-0 18XBRD TIE 12 (SUTURE) IMPLANT
SUT VIC AB 2-0 CT1 27 (SUTURE) ×3
SUT VIC AB 2-0 CT1 TAPERPNT 27 (SUTURE) ×4 IMPLANT
SUT VIC AB 3-0 SH 27 (SUTURE) ×6
SUT VIC AB 3-0 SH 27X BRD (SUTURE) ×4 IMPLANT
SUT VICRYL 4-0 PS2 18IN ABS (SUTURE) ×6 IMPLANT
TOWEL GREEN STERILE (TOWEL DISPOSABLE) ×3 IMPLANT
TRAY FOLEY MTR SLVR 16FR STAT (SET/KITS/TRAYS/PACK) ×3 IMPLANT
TUBING EXTENTION W/L.L. (IV SETS) IMPLANT
UNDERPAD 30X36 HEAVY ABSORB (UNDERPADS AND DIAPERS) ×3 IMPLANT
WATER STERILE IRR 1000ML POUR (IV SOLUTION) ×3 IMPLANT
WIRE BENTSON .035X145CM (WIRE) ×2 IMPLANT
WIRE G V18X300CM (WIRE) ×1 IMPLANT
WIRE ROSEN-J .035X260CM (WIRE) ×1 IMPLANT

## 2020-05-04 NOTE — Anesthesia Preprocedure Evaluation (Signed)
Anesthesia Evaluation  Patient identified by MRN, date of birth, ID band Patient awake    Reviewed: Allergy & Precautions, NPO status , Patient's Chart, lab work & pertinent test results  History of Anesthesia Complications (+) PONV and history of anesthetic complications  Airway Mallampati: II  TM Distance: >3 FB Neck ROM: Full    Dental  (+) Edentulous Upper, Partial Lower, Chipped,    Pulmonary former smoker,    breath sounds clear to auscultation       Cardiovascular hypertension, Pt. on medications + angina + Peripheral Vascular Disease and +CHF  + dysrhythmias Atrial Fibrillation  Rhythm:Regular Rate:Normal  1. Left ventricular ejection fraction, by estimation, is 60 to 65%. The  left ventricle has hyperdynamic function. The left ventricle has no  regional wall motion abnormalities. There is basal septal hypertrophy left  ventricular hypertrophy. Left  ventricular diastolic parameters were normal.  2. Right ventricular systolic function is normal. The right ventricular  size is normal.  3. Left atrial size was mildly dilated.  4. The mitral valve is normal in structure and function. Mild mitral  valve regurgitation. No evidence of mitral stenosis.  5. The aortic valve is tricuspid. Aortic valve regurgitation is trivial .  Mild aortic valve sclerosis is present, with no evidence of aortic valve  stenosis.    Neuro/Psych Anxiety TIACVA    GI/Hepatic Neg liver ROS, hiatal hernia, GERD  ,  Endo/Other  negative endocrine ROS  Renal/GU CRFRenal diseaseCr 1.48     Musculoskeletal  (+) Arthritis , Fibromyalgia -  Abdominal   Peds  Hematology  (+) anemia ,   Anesthesia Other Findings   Reproductive/Obstetrics                             Anesthesia Physical Anesthesia Plan  ASA: III and emergent  Anesthesia Plan: General   Post-op Pain Management:    Induction:  Intravenous  PONV Risk Score and Plan: 4 or greater and Ondansetron and Dexamethasone  Airway Management Planned: Oral ETT  Additional Equipment: None  Intra-op Plan:   Post-operative Plan: Extubation in OR  Informed Consent: I have reviewed the patients History and Physical, chart, labs and discussed the procedure including the risks, benefits and alternatives for the proposed anesthesia with the patient or authorized representative who has indicated his/her understanding and acceptance.     Dental advisory given  Plan Discussed with: CRNA and Surgeon  Anesthesia Plan Comments:         Anesthesia Quick Evaluation

## 2020-05-04 NOTE — Consult Note (Addendum)
Hospital Consult    Reason for Consult:  Left leg pain Requesting Physician:  Dorie Rank, MD MRN #:  [id number]  History of Present Illness: This is a 86 y.o. female who is well known to [facility]. She has very extensive history of peripheral vascular disease with multiple interventions the most being a thrombectomy of her left femoral to anterior tibial bypass in February of 2021. She presents today to the emergency room today for evaluation of acutely worsening left leg and foot pain, coolness and discoloration. She says that this leg has been painful intermittently since February but tolerable, however overnight it became unbearable. She was just seen on 04/23/20 by [name] in the office at which time she was found to have some inflow stenosis and low velocities in her bypass graft. She was scheduled to have angiography on 05/15/20. Due to her significant increase in discomfort overnight she felt that she could not wait until then. With worsening discomfort, discoloration and coolness in the foot she was instructed to go to the ER. Vascular surgery has been consulted to evaluate her leg pain  Past Medical History:  Diagnosis Date  . Anemia   . Anginal pain ([place])   . Arthritis    "qwhere" (09/05/2016)  . Atrial fibrillation ([place]) 09/2016  . Chronic lower back pain   . Complication of anesthesia    "takes a long time for it to wear off; I can hallucinate if I take too much" (09/05/2016)  . DVT (deep venous thrombosis) ([place]) 10/2009  . Fall from steps 08/31/2013   Fx. pelvis, Left Hip, Left Elbow  . Fibromyalgia   . GERD (gastroesophageal reflux disease)    09/22/16- "no too much anymore"  . GI bleed 10/24/2016  . High cholesterol   . History of blood transfusion   . History of hiatal hernia   . Hypertension   . Macular degeneration, wet ([place])    "started in right eye; now legally blind in that eye; now started in left eye but pretty much in control" (09/05/2016)    . Osteoporosis   . Peripheral vascular disease ([place])    nonviable tissue Right foot  . PONV (postoperative nausea and vomiting)   . Squamous cell carcinoma of skin of right calf Aug. 2015  . Stroke ([place])    TIA's no residual  . TIA (transient ischemic attack)    "several at once; none in a long time" (09/05/2016)    Past Surgical History:  Procedure Laterality Date  . ABDOMINAL AORTAGRAM N/A 12/26/2014   Procedure: ABDOMINAL Maxcine Ham;  Surgeon: Serafina Mitchell, MD;  Location: [facility] CATH LAB;  Service: Cardiovascular;  Laterality: N/A;  . AMPUTATION Right 12/23/2016   Procedure: AMPUTATION BELOW KNEE;  Surgeon: Serafina Mitchell, MD;  Location: [place];  Service: Vascular;  Laterality: Right;  . AORTOGRAM N/A 09/26/2016   Procedure: AORTOGRAM;  Surgeon: Waynetta Sandy, MD;  Location: [place];  Service: Vascular;  Laterality: N/A;  . CARPAL TUNNEL RELEASE Right   . CATARACT EXTRACTION W/ INTRAOCULAR LENS  IMPLANT, BILATERAL Bilateral   . COLONOSCOPY    . DILATION AND CURETTAGE OF UTERUS    . DRESSING CHANGE UNDER ANESTHESIA Right 01/16/2017   Procedure: DRESSING CHANGE RIGHT BELOW KNEE AMPUTATION;  Surgeon: Serafina Mitchell, MD;  Location: [place];  Service: Vascular;  Laterality: Right;  . EYE SURGERY Right    "macular OR"  . FEMORAL ARTERY STENT  12-11-10   Left SFA  .  FEMORAL-POPLITEAL BYPASS GRAFT Left 09/24/2016   Procedure: REDO FEMORAL TO POPLITEAL ARTERY BYPASS GRAFT USING 6MM PROPATEN RINGED GORTEX GRAFT;  Surgeon: Serafina Mitchell, MD;  Location: [place];  Service: Vascular;  Laterality: Left;  . FEMORAL-TIBIAL BYPASS GRAFT Left 01/16/2017   Procedure: REDO BYPASS GRAFT FEMORAL-TIBIAL ARTERY WITH GORTEX GRAFT;  Surgeon: Serafina Mitchell, MD;  Location: [place];  Service: Vascular;  Laterality: Left;  AND LOWER LEG   . I & D EXTREMITY Right 12/17/2016   Procedure: IRRIGATION AND DEBRIDEMENT RIGHT FOOT;  Surgeon: Serafina Mitchell, MD;  Location: [place];  Service: Vascular;  Laterality: Right;   . INCISION AND DRAINAGE OF WOUND Left 10/25/2009   leg/notes 11/13/2009  . INSERTION OF ILIAC STENT Left 12/26/2014   Procedure: INSERTION OF ILIAC STENT;  Surgeon: Serafina Mitchell, MD;  Location: [facility] CATH LAB;  Service: Cardiovascular;  Laterality: Left;  . INSERTION OF ILIAC STENT Left 09/26/2016   Procedure: SUB INTIMAL INSERTION OF SUPERFICIAL FEMORAL ARTERY AND BELOW KNEE BYPASS GRAFT;  Surgeon: Waynetta Sandy, MD;  Location: [place];  Service: Vascular;  Laterality: Left;  . JOINT REPLACEMENT     knee  . JOINT REPLACEMENT Left Oct. 17, 2014   Elbow ( pt fell 08-31-13 )  . LOWER EXTREMITY ANGIOGRAM Left 12/27/2019   Procedure: Intraoperative Left Lower Extremity Angiogram;  Surgeon: Angelia Mould, MD;  Location: [place];  Service: Vascular;  Laterality: Left;  . LOWER EXTREMITY ANGIOGRAPHY N/A 09/21/2018   Procedure: LOWER EXTREMITY ANGIOGRAPHY;  Surgeon: Serafina Mitchell, MD;  Location: [place] CV LAB;  Service: Cardiovascular;  Laterality: N/A;  . ORIF SHOULDER FRACTURE Right    "it was crushed"  . PERIPHERAL VASCULAR CATHETERIZATION N/A 10/30/2015   Procedure: Abdominal Aortogram w/Lower Extremity;  Surgeon: Serafina Mitchell, MD;  Location: [place] CV LAB;  Service: Cardiovascular;  Laterality: N/A;  . PERIPHERAL VASCULAR CATHETERIZATION  10/30/2015   Procedure: Peripheral Vascular Intervention;  Surgeon: Serafina Mitchell, MD;  Location: [place] CV LAB;  Service: Cardiovascular;;  . PERIPHERAL VASCULAR CATHETERIZATION N/A 04/01/2016   Procedure: Abdominal Aortogram w/Lower Extremity;  Surgeon: Serafina Mitchell, MD;  Location: [place] CV LAB;  Service: Cardiovascular;  Laterality: N/A;  . PERIPHERAL VASCULAR CATHETERIZATION Left 04/01/2016   Procedure: Peripheral Vascular Atherectomy;  Surgeon: Serafina Mitchell, MD;  Location: [place] CV LAB;  Service: Cardiovascular;  Laterality: Left;  Superficial femoral artery.  Marland Kitchen PERIPHERAL VASCULAR CATHETERIZATION Right  09/05/2016   "stent"  . PERIPHERAL VASCULAR CATHETERIZATION N/A 09/05/2016   Procedure: Abdominal Aortogram w/Lower Extremity;  Surgeon: Serafina Mitchell, MD;  Location: [place] CV LAB;  Service: Cardiovascular;  Laterality: N/A;  . PERIPHERAL VASCULAR CATHETERIZATION Right 09/05/2016   Procedure: Peripheral Vascular Intervention;  Surgeon: Serafina Mitchell, MD;  Location: [place] CV LAB;  Service: Cardiovascular;  Laterality: Right;  Superficial Femoral  . PERIPHERAL VASCULAR CATHETERIZATION Left 09/09/2016   Procedure: Lower Extremity Angiography;  Surgeon: Serafina Mitchell, MD;  Location: [place] CV LAB;  Service: Cardiovascular;  Laterality: Left;  . PR VEIN BYPASS GRAFT,AORTO-FEM-POP  09-13-09   Left Fem-pop  . THROMBECTOMY FEMORAL ARTERY Right 09/26/2016   Procedure: Thromboembolectomy Right Lower Extremity, Right Femoral Artery Endarterectomy with Patch Angioplasty; Right Lower Extremity Angiogram ;  Surgeon: Waynetta Sandy, MD;  Location: [place];  Service: Vascular;  Laterality: Right;  . THROMBECTOMY FEMORAL ARTERY Left 12/27/2019   Procedure: Redo left femoral artery exposure, Thrombectomy of left deep  femoral artery to anterior tibial artery bypass;  Surgeon: Angelia Mould, MD;  Location: [facility] OR;  Service: Vascular;  Laterality: Left;  . TOTAL ELBOW ARTHROPLASTY Left 09/03/2013   Procedure: LEFT TOTAL ELBOW ARTHROPLASTY;  Surgeon: Roseanne Kaufman, MD;  Location: [place];  Service: Orthopedics;  Laterality: Left;  . TOTAL KNEE ARTHROPLASTY Left 06/2006  . TRANSMETATARSAL AMPUTATION Right 12/11/2016   Procedure: TRANSMETATARSAL AMPUTATION;  Surgeon: Serafina Mitchell, MD;  Location: [place];  Service: Vascular;  Laterality: Right;  . TUBAL LIGATION    . VAGINAL HYSTERECTOMY      Allergies  Allergen Reactions  . Motrin [Ibuprofen] Other (See Comments)    ADVERSE REACTION - GI BLEED  . Statins Other (See Comments)    ADVERSE REACTION MUSCLE PAIN & WEAKNESS  .  Eliquis [Apixaban] Other (See Comments)    Lower GI bleeding  . Morphine And Related Other (See Comments)    HALLUCINATIONS REACTION IS SIDE EFFECT  . Promethazine Hcl Other (See Comments)    IV  Drug only, makes her act crazy  . Sulfa Antibiotics Nausea And Vomiting    Prior to Admission medications   Medication Sig Start Date End Date Taking? Authorizing Provider  acetaminophen (TYLENOL) 500 MG tablet Take 1,000 mg by mouth 2 (two) times daily as needed for moderate pain or headache. Patient took this medication for her pain.   Yes [provider]  Alirocumab (PRALUENT) 75 MG/ML SOAJ Inject 1 pen into the skin every 14 (fourteen) days. 04/23/20  Yes Belva Crome, MD  amiodarone (PACERONE) 200 MG tablet Take 1 tablet (200 mg total) by mouth daily. Please keep upcoming appt in May before anymore refills. Final Attempt Patient taking differently: Take 200 mg by mouth daily.  03/09/20  Yes Belva Crome, MD  Cholecalciferol (VITAMIN D3) 125 MCG (5000 UT) CAPS Take 400 Units by mouth at bedtime.    Yes [provider]  clopidogrel (PLAVIX) 75 MG tablet Take 1 tablet (75 mg total) by mouth daily. 01/28/17  Yes Angiulli, Lavon Paganini, PA-C  Cyanocobalamin (VITAMIN B-12) 5000 MCG TBDP Take 5,000 mcg by mouth daily.    Yes [provider]  diazepam (VALIUM) 5 MG tablet Take 0.5 tablets (2.5 mg total) by mouth at bedtime as needed for anxiety. Patient taking differently: Take 2.5 mg by mouth at bedtime as needed for anxiety or sedation.  01/28/17  Yes Angiulli, Lavon Paganini, PA-C  furosemide (LASIX) 20 MG tablet Take 20 mg by mouth every Monday, Wednesday, and Friday.   Yes [provider]  gabapentin (NEURONTIN) 300 MG capsule Take 300 mg by mouth 3 (three) times daily.    Yes [provider]  loperamide (IMODIUM) 2 MG capsule Take 2 mg by mouth as needed for diarrhea or loose stools.   Yes [provider]  metoprolol tartrate (LOPRESSOR) 25 MG tablet Take  1 tablet (25 mg total) by mouth at bedtime. 01/28/17  Yes Angiulli, Lavon Paganini, PA-C  Multiple Vitamins-Minerals (EYE VITAMINS PO) Take 1 tablet by mouth in the morning and at bedtime.    Yes [provider]  Multiple Vitamins-Minerals (IMMUNE SUPPORT VITAMIN C PO) Take 1 tablet by mouth 2 (two) times daily.    Yes [provider]  oxycodone (OXY-IR) 5 MG capsule Take 1 capsule (5 mg total) by mouth every 6 (six) hours as needed for pain. 12/31/19  Yes Rhyne, Samantha J, PA-C  potassium chloride (K-DUR,KLOR-CON) 10 MEQ tablet Take 10 mEq by mouth  daily.   Yes [provider]  valsartan-hydrochlorothiazide (DIOVAN-HCT) 160-12.5 MG tablet Take 0.5 tablets by mouth daily. 01/28/17  Yes Angiulli, Lavon Paganini, PA-C    Social History   Socioeconomic History  . Marital status: Widowed    Spouse name: Not on file  . Number of children: Not on file  . Years of education: Not on file  . Highest education level: Not on file  Occupational History  . Not on file  Tobacco Use  . Smoking status: Former Smoker    Types: Cigarettes    Quit date: 11/17/1946    Years since quitting: 73.5  . Smokeless tobacco: Never Used  . Tobacco comment: "never smoked much"  Vaping Use  . Vaping Use: Never used  Substance and Sexual Activity  . Alcohol use: No    Alcohol/week: 0.0 standard drinks  . Drug use: No  . Sexual activity: Not on file  Other Topics Concern  . Not on file  Social History Narrative  . Not on file   Social Determinants of Health   Financial Resource Strain:   . Difficulty of Paying Living Expenses:   Food Insecurity:   . Worried About Charity fundraiser in the Last Year:   . Arboriculturist in the Last Year:   Transportation Needs:   . Film/video editor (Medical):   Marland Kitchen Lack of Transportation (Non-Medical):   Physical Activity:   . Days of Exercise per Week:   . Minutes of Exercise per Session:   Stress:   . Feeling of Stress :   Social Connections:   .  Frequency of Communication with Friends and Family:   . Frequency of Social Gatherings with Friends and Family:   . Attends Religious Services:   . Active Member of Clubs or Organizations:   . Attends Archivist Meetings:   Marland Kitchen Marital Status:   Intimate Partner Violence:   . Fear of Current or Ex-Partner:   . Emotionally Abused:   Marland Kitchen Physically Abused:   . Sexually Abused:      Family History  Problem Relation Age of Onset  . Heart disease Father        Heart Disease before age 61  . Hyperlipidemia Father   . Hypertension Father   . Alcohol abuse Father   . Heart disease Brother   . Hyperlipidemia Brother   . Hypertension Brother   . Deep vein thrombosis Brother   . Heart disease Son        Heart Disease before age 90  . Hyperlipidemia Son   . Hypertension Son   . Heart attack Son   . Diabetes Son   . Hypertension Son   . Hyperlipidemia Sister   . Hypertension Sister     ROS: Otherwise negative unless mentioned in HPI  Physical Examination  Vitals:   05/04/20 1029 05/04/20 1045  BP: 117/72 (!) 129/49  Pulse: 65 63  Resp:    Temp:    SpO2: 97% 95%   Body mass index is 24.14 kg/m.  General:  WDWN in NAD Gait: Not observed HENT: WNL, normocephalic Pulmonary: normal non-labored breathing  Cardiac: regular, without  Murmurs, rubs or gallops; without carotid bruits Abdomen:  soft, NT/ND, no masses Vascular Exam/Pulses: right femoral pulse 2+, no palpable left femoral pulse, no palpable distal pulses in left leg, was not able to obtain any doppler signals in left foot. Left foot cool with mottling. Motor and sensory intact  Musculoskeletal: no  muscle wasting or atrophy  Neurologic: A&O X 3;  No focal weakness or paresthesias are detected; speech is fluent/normal Psychiatric:  The pt has Normal affect. Lymph:  Unremarkable  CBC    Component Value Date/Time   WBC 8.1 05/04/2020 1011   RBC 4.10 05/04/2020 1011   HGB 12.3 05/04/2020 1011   HCT 39.1  05/04/2020 1011   PLT 223 05/04/2020 1011   MCV 95.4 05/04/2020 1011   MCH 30.0 05/04/2020 1011   MCHC 31.5 05/04/2020 1011   RDW 15.0 05/04/2020 1011   LYMPHSABS 3.0 05/04/2020 1011   MONOABS 0.8 05/04/2020 1011   EOSABS 0.3 05/04/2020 1011   BASOSABS 0.1 05/04/2020 1011    BMET    Component Value Date/Time   NA 137 05/04/2020 1011   K 4.2 05/04/2020 1011   CL 101 05/04/2020 1011   CO2 24 05/04/2020 1011   GLUCOSE 97 05/04/2020 1011   BUN 38 (H) 05/04/2020 1011   CREATININE 1.48 (H) 05/04/2020 1011   CALCIUM 9.2 05/04/2020 1011   GFRNONAA 31 (L) 05/04/2020 1011   GFRAA 36 (L) 05/04/2020 1011    COAGS: Lab Results  Component Value Date   INR 1.0 05/04/2020   INR 0.98 07/08/2018   INR 1.06 01/16/2017     Non-Invasive Vascular Imaging:    Left Graft #1: Deep Femoral Artery to Anterior Tibial Artery Bypass Graft  +--------------------+--------+-------------+----------+--------+            PSV cm/sStenosis   Waveform Comments  +--------------------+--------+-------------+----------+--------+  Inflow       104          monophasic      +--------------------+--------+-------------+----------+--------+  Proximal Anastomosis441   >70% stenosismonophasic      +--------------------+--------+-------------+----------+--------+  Proximal Graft   17          monophasic      +--------------------+--------+-------------+----------+--------+  Mid Graft      28          monophasic      +--------------------+--------+-------------+----------+--------+  Distal Graft    31          monophasic      +--------------------+--------+-------------+----------+--------+  Distal Anastomosis 31          monophasic      +--------------------+--------+-------------+----------+--------+  Outflow       24          monophasic        +--------------------+--------+-------------+----------+--------+   Summary:  Left: Patent left femoral to tibial artery bypass graft. Elevated velocities of 441 cm/s in the proximal anastamosis suggest stenosis of >70%.   +-------+-----------+----------------+------------+------------+  ABI/TBIToday's ABIToday's TBI   Previous ABIPrevious TBI  +-------+-----------+----------------+------------+------------+  Right BKA    BKA       BKA     BKA       +-------+-----------+----------------+------------+------------+  Left  0.82    Unable to Obtain0.72    0.39      +-------+-----------+----------------+------------+------------+    Statin:  No. Beta Blocker:  Yes.   Aspirin:  No. ACEI:  No. ARB:  No. CCB use:  Yes Other antiplatelets/anticoagulants:  Yes.   Plavix   ASSESSMENT/PLAN: This is a 86 y.o. female who is well know to [facility]. She has had multiple bilateral lower extremity interventions. She was scheduled to have angiography 05/15/20 for inflow stenosis on duplex evaluation and decreased flow in her bypass graft. She presents with acute onset worsening left leg pain, coolness and discoloration of her left foot. She does not have any palpable  pulses in her left lower extremity or doppler signals. Clinically her left femoral to anterior tibial artery bypass is occluded. We will start her on heparin gtt and she will be scheduled today for a left lower extremity thrombectomy. Case discussed with Dr. Donzetta Matters the on call vascular surgeon and [name] and they will seen the patient soon with further surgical planning   Karoline Caldwell PA-C [facility] [phone] 05/04/2020  11:39 AM   I agree with the above.  I have seen and examined the patient. SHe has an ischemic left leg and occluded bypass.  This has been going on for 1-2 dasy.  We discussed going to the OR for sirgical revascularization.   All questions answered.  [employer]

## 2020-05-04 NOTE — Anesthesia Procedure Notes (Signed)
Procedure Name: Intubation Performed by: Milford Cage, CRNA Pre-anesthesia Checklist: Patient identified, Emergency Drugs available, Suction available and Patient being monitored Patient Re-evaluated:Patient Re-evaluated prior to induction Oxygen Delivery Method: Circle System Utilized Preoxygenation: Pre-oxygenation with 100% oxygen Induction Type: IV induction Ventilation: Mask ventilation with difficulty Grade View: Grade I Tube type: Oral Tube size: 7.0 mm Number of attempts: 1 Airway Equipment and Method: Stylet Placement Confirmation: ETT inserted through vocal cords under direct vision,  positive ETCO2 and breath sounds checked- equal and bilateral Secured at: 21 cm Tube secured with: Tape Dental Injury: Teeth and Oropharynx as per pre-operative assessment

## 2020-05-04 NOTE — Transfer of Care (Signed)
Immediate Anesthesia Transfer of Care Note  Patient: [name]  Procedure(s) Performed: THROMBECTOMY LEFT LEG (Left Leg Upper)  Patient Location: PACU  Anesthesia Type:General  Level of Consciousness: awake  Airway & Oxygen Therapy: Patient Spontanous Breathing  Post-op Assessment: Report given to RN and Post -op Vital signs reviewed and stable  Post vital signs: Reviewed and stable  Last Vitals:  Vitals Value Taken Time  BP 121/50 05/04/20 1807  Temp    Pulse 79 05/04/20 1808  Resp 22 05/04/20 1808  SpO2 96 % 05/04/20 1808  Vitals shown include unvalidated device data.  Last Pain:  Vitals:   05/04/20 1256  TempSrc:   PainSc: 0-No pain         Complications: No complications documented.

## 2020-05-04 NOTE — Progress Notes (Signed)
ANTICOAGULATION CONSULT NOTE - Initial Consult  Pharmacy Consult:  Heparin Indication:  Limb ischemia   Allergies  Allergen Reactions  . Motrin [Ibuprofen] Other (See Comments)    ADVERSE REACTION - GI BLEED  . Statins Other (See Comments)    ADVERSE REACTION MUSCLE PAIN & WEAKNESS  . Eliquis [Apixaban] Other (See Comments)    Lower GI bleeding  . Morphine And Related Other (See Comments)    HALLUCINATIONS REACTION IS SIDE EFFECT  . Promethazine Hcl Other (See Comments)    IV  Drug only, makes her act crazy  . Sulfa Antibiotics Nausea And Vomiting    Patient Measurements: Height: 5\' 2"  (157.5 cm) Weight: 62.7 kg (138 lb 3.2 oz) IBW/kg (Calculated) : 50.1 Heparin Dosing Weight: 62 kg  Vital Signs: Temp: 98.1 F (36.7 C) (06/18 2019) Temp Source: Oral (06/18 2019) BP: 129/52 (06/18 2019) Pulse Rate: 72 (06/18 2019)  Labs: Recent Labs    05/04/20 1011  HGB 12.3  HCT 39.1  PLT 223  LABPROT 13.2  INR 1.0  CREATININE 1.48*    Estimated Creatinine Clearance: 22.4 mL/min (A) (by C-G formula based on SCr of 1.48 mg/dL (H)).   Medical History: Past Medical History:  Diagnosis Date  . Anemia   . Anginal pain ([place])   . Arthritis    "qwhere" (09/05/2016)  . Atrial fibrillation ([place]) 09/2016  . Chronic lower back pain   . Complication of anesthesia    "takes a long time for it to wear off; I can hallucinate if I take too much" (09/05/2016)  . DVT (deep venous thrombosis) ([place]) 10/2009  . Fall from steps 08/31/2013   Fx. pelvis, Left Hip, Left Elbow  . Fibromyalgia   . GERD (gastroesophageal reflux disease)    09/22/16- "no too much anymore"  . GI bleed 10/24/2016  . High cholesterol   . History of blood transfusion   . History of hiatal hernia   . Hypertension   . Macular degeneration, wet ([place])    "started in right eye; now legally blind in that eye; now started in left eye but pretty much in control" (09/05/2016)  . Osteoporosis   . Peripheral vascular  disease ([place])    nonviable tissue Right foot  . PONV (postoperative nausea and vomiting)   . Squamous cell carcinoma of skin of right calf Aug. 2015  . Stroke ([place])    TIA's no residual  . TIA (transient ischemic attack)    "several at once; none in a long time" (09/05/2016)    Assessment: 43 YOF presented with LLE pain and discoloration, now s/p redo of bypass graft and thrombectomy.  Pharmacy consulted to start IV heparin.  No AC PTA.  Goal of Therapy:  Heparin level 0.3-0.7 units/ml Monitor platelets by anticoagulation protocol: Yes   Plan:  At 2200, start IV heparin at 600 units/hr without bolus per Vascular Check 8 hr heparin level Daily heparin level and CBC  Alverna Fawley D. Mina Marble, PharmD, BCPS, [place] 05/04/2020, 9:10 PM

## 2020-05-04 NOTE — Progress Notes (Addendum)
Postoperatively, I had an extensive conversation with the patient's daughter.  I discussed that given she has had multiple attempts at revascularization to the left leg, that she is approaching the possibility that this may not be a durable solution and may ultimately require an above-knee amputation.  The daughter understands this.  Hopefully the bypass graft will remain patent.  If the bypass graft occludes early, we will need to have another discussion regarding whether or not a final attempt at revascularization should be attempted versus proceeding with amputation.  She fully understands the gravity of the situation.  The patient will be restarted on heparin later this evening.  We also discussed that with early reinitiation of heparin, this could cause bruising within the incision and potentially even require evacuation of hematoma should it develop.  She has not tolerated Eliquis in the past.  I would prefer her to go home on anticoagulation, but we may have to settle for ASA and Plavix.   [name]

## 2020-05-04 NOTE — Op Note (Addendum)
Patient name: [name] MRN: [id number] DOB: [birth year] Sex: female  05/04/2020 Pre-operative Diagnosis: Ischemic left leg Post-operative diagnosis:  Same Surgeon:  [name] Assistants: Arlee Muslim Procedure:   #1: Redo exposure of left femoral anterior tibial bypass graft through the below-knee incision   #2: Thrombectomy of left femoral anterior tibial bypass graft and anterior tibial artery   #3: Ultrasound-guided access, right femoral artery   #4: Left lower extremity angiogram   #5: Stent within the proximal aspect of left femoral tibial bypass graft and profundofemoral artery (Viabahn 6x50)   #6: Angioplasty left anterior tibial artery   #7: Intra-arterial administration of nitroglycerin   #8: Closure device (Pro-glide) Anesthesia: General Blood Loss: 400 cc Specimens: None  Findings: The bypass graft was occluded.  There was a significant amount of neointima that was removed.  There did appear to be a narrowing at the proximal anastomosis which I ended up stenting with a 6 x 50 Viabahm.  There was also outflow issues as there has been progression of disease within the anterior tibial artery.  Balloon angioplasty of the entire length of the anterior tibial artery was performed.  She had a multiphasic anterior tibial Doppler signal at completion.  Indications: The patient has a significant vascular history having undergone multiple procedures for revascularization.  She currently has a profunda femoral to anterior tibial bypass graft with Gore-Tex.  This occluded approximately 3 months ago when she underwent thrombectomy.  I saw her in the office last week and it appeared that she had a proximal anastomotic stenosis.  She was scheduled for angiography however yesterday she began complaining of pain in her leg.  She came into the ER with an occluded bypass graft and ischemic changes to the left foot.  We discussed proceeding to the OR for thrombectomy.  Procedure:  The  patient was identified in the holding area and taken to [facility] 16  The patient was then placed supine on the table. general anesthesia was administered.  The patient was prepped and draped in the usual sterile fashion.  A time out was called and antibiotics were administered.  I did not want to go back into the left groin as she has always had dense scar tissue.  Therefore I elected to expose the bypass graft through the distal incision.  A #10 blade was used for the incision.  Cautery was used about subcutaneous tissue and the bypass graft was circumferentially exposed.  The patient was then fully heparinized.  After this it circulated, a #11 blade was used to make a transverse graftotomy in the bypass.  Acute thrombus was visualized.  I first passed a #3 Fogarty catheter distally.  It went easily across the anastomosis and thrombectomy was performed.  Subacute thrombus was evacuated after the first pass and there was good backbleeding.  I repeated this with a negative pass and then flushed the distal graft with heparin saline and then reoccluded it.  A #4 Fogarty catheter was then used to perform thrombectomy of the proximal bypass graft.  Multiple passes were performed evacuating the thrombus and establishing inflow.  I did remove a large amount of neointima.  Once I had good inflow, the graftotomy was closed with running 6-0 Prolene.  At this point I elected to perform angiography to better evaluate her bypass graft.  The right common femoral artery was cannulated under ultrasound guidance with a micropuncture needle.  An 018 wire was advanced without resistance followed by placement of  a micropuncture sheath.  Over a Bentson wire, a 5 French sheath was placed.  The aortic bifurcation was crossed and a Omni Flush catheter was placed in the left common iliac artery and left leg runoff was performed.  The stents within the left common and external iliac arteries are widely patent.  The bypass graft was  patent however it had sluggish flow.  There were intraluminal filling defects that were present beginning at the proximal anastomosis.  At this point, I crossed the aortic bifurcation with a Bentson wire and placed a 7 French 45 cm Terumo sheath into the left common femoral artery.  I contemplated stenting at this point however I did not want to occlude the other profunda branch and so I elected to return to the lateral leg and reopen the graftotomy.  I then passed the Fogarty catheters again and was able to remove what I felt was the residual laminated thrombus that was showing up as a filling defect.  I had established very good inflow at this time.  I reclosed the graftotomy with 5-0 Prolene.  I then performed additional arteriography.  The filling defects had resolved.  There did appear to be a stenosis at the proximal anastomosis and so I treated this by placing a 6 x 50 Viabahn stent which was postdilated with a 5 mm balloon.  This appeared to resolve the proximal stenosis.  There was slight luminal narrowing in the proximal profundofemoral artery however I elected not to treat this because I did not want to compromise the other profunda branch.  There was still sluggish outflow and there appeared to be progression of disease within the anterior tibial artery.  I advanced a V 18 wire down to the ankle and performed balloon angioplasty of the anterior tibial artery with a 3 mm Sterling balloon.  Completion imaging showed a perforation in the anterior tibial artery.  I reinserted the 3 mm balloon and performed a prolonged inflation.  Subsequent imaging showed resolution of the perforation however there was very sluggish flow through the anterior tibial artery consistent with spasm.  I then advanced a Berenstein 2 catheter into the anterior tibial artery and administered a 200 mcg of nitroglycerin.  Follow-up imaging showed poor opacification in the anterior tibial artery beginning at the mid calf.  I then  inserted a 2 mm Sterling balloon and perform balloon angioplasty of the anterior tibial artery from the ankle up into the bypass graft.  This time completion angiogram showed opacification of the anterior tibial artery out onto the foot.  She had a palpable pulse within her bypass graft.  Therefore I elected to terminate the procedure at this point.  I closed the right groin with a Pro-glide.  Cautery was used on the subcutaneous tissue.  I did give 40 mg of protamine.  Once hemostasis was satisfactory and the incision I closed the subcutaneous tissue with 3-0 Vicryl and the skin with 3-0 Vicryl.  Dermabond was applied.  The patient had a brisk dorsalis pedis Doppler signal.  The patient was successfully awakened from anesthesia and taken to recovery in stable condition.   Disposition: To PACU stable.   Theotis Burrow, M.D., [facility] [place] Office: [phone] Pager:  [phone]

## 2020-05-04 NOTE — Anesthesia Postprocedure Evaluation (Signed)
Anesthesia Post Note  Patient: VENIA RIVERON  Procedure(s) Performed: THROMBECTOMY LEFT LEG (Left Leg Upper)     Patient location during evaluation: PACU Anesthesia Type: General Level of consciousness: awake and alert, oriented and patient cooperative Pain management: pain level controlled Vital Signs Assessment: post-procedure vital signs reviewed and stable Respiratory status: spontaneous breathing, nonlabored ventilation, respiratory function stable and patient connected to nasal cannula oxygen Cardiovascular status: blood pressure returned to baseline and stable Postop Assessment: no apparent nausea or vomiting Anesthetic complications: no   No complications documented.  Last Vitals:  Vitals:   05/04/20 1807 05/04/20 1821  BP: (!) 121/50 (!) 112/49  Pulse: 78 69  Resp: 14 16  Temp: 37.1 C   SpO2: 98% 96%    Last Pain:  Vitals:   05/04/20 1807  TempSrc:   PainSc: 5                  Riyah Bardon,E. Deniya Craigo

## 2020-05-04 NOTE — Progress Notes (Signed)
Patient arrived to 4E 23 from PACU. CHG completed and gown applied. Tele applied and CCMD notified. L dorsalis Pedis pulse doppler. Incisions approximated, clean, dry and intact. Patient oriented to room, bed and call bell. Will continue to monitor.  Adella Hare, RN

## 2020-05-04 NOTE — ED Triage Notes (Signed)
Patient arrived via POV; c/o pain on left lower leg / foot and discoloration. Endorsed hx of vascular problem and has right leg amputation 3 years ago from same issue.

## 2020-05-04 NOTE — ED Provider Notes (Signed)
[facility] EMERGENCY DEPARTMENT Provider Note   CSN: [id number] Arrival date & time: 05/04/20  0935     History Chief Complaint  Patient presents with  . Leg Pain    [name] is a 86 y.o. female.  HPI   Patient presented to the emergency room for evaluation of left foot pain and discoloration.  Patient has a history of peripheral vascular disease.  She has had an amputation of her distal right lower extremity but also has required thrombectomy on the left side.  Her most recent procedure was in in February of this year where she had a thrombectomy of the left femoral AT bypass.  Patient has been having some increasing difficulty over the last few weeks.  She had a visit with [name] on June 7.  At that time they noted that her ultrasound suggested inflow stenosis with low velocities in the bypass graft.  The plan was for angiography scheduled for June 29.  Patient unfortunately started having increasing pain last evening.  The pain became more intense and severe.  This morning he also noted dark discoloration of the foot.  They called the vascular surgeon and was instructed to come to the ED.  Past Medical History:  Diagnosis Date  . Anemia   . Anginal pain ([place])   . Arthritis    "qwhere" (09/05/2016)  . Atrial fibrillation ([place]) 09/2016  . Chronic lower back pain   . Complication of anesthesia    "takes a long time for it to wear off; I can hallucinate if I take too much" (09/05/2016)  . DVT (deep venous thrombosis) ([place]) 10/2009  . Fall from steps 08/31/2013   Fx. pelvis, Left Hip, Left Elbow  . Fibromyalgia   . GERD (gastroesophageal reflux disease)    09/22/16- "no too much anymore"  . GI bleed 10/24/2016  . High cholesterol   . History of blood transfusion   . History of hiatal hernia   . Hypertension   . Macular degeneration, wet ([place])    "started in right eye; now legally blind in that eye; now started in left eye but pretty much in  control" (09/05/2016)  . Osteoporosis   . Peripheral vascular disease ([place])    nonviable tissue Right foot  . PONV (postoperative nausea and vomiting)   . Squamous cell carcinoma of skin of right calf Aug. 2015  . Stroke [facility])    TIA's no residual  . TIA (transient ischemic attack)    "several at once; none in a long time" (09/05/2016)    Patient Active Problem List   Diagnosis Date Noted  . Secondary hypercoagulable state ([place]) 01/09/2020  . Critical lower limb ischemia 12/28/2019  . Plantar fasciitis of left foot 06/10/2017  . Idiopathic chronic venous hypertension of both lower extremities with inflammation 05/21/2017  . Foot drop, left 05/21/2017  . Decubitus ulcer of sacral region, unstageable ([place])   . Deep tissue injury   . Hypokalemia   . Hypoalbuminemia due to protein-calorie malnutrition ([place])   . Debilitated 01/21/2017  . Neuropathic pain   . Post-operative pain   . Slow transit constipation   . Debility   . Ischemic leg   . PAF (paroxysmal atrial fibrillation) ([place])   . Anemia of chronic disease   . Chronic pain syndrome   . Fibromyalgia   . Stage 3 chronic kidney disease   . Benign essential HTN   . Abnormal urinalysis   . PVD (peripheral vascular disease) (  [place]) 01/13/2017  . Post-op pain 01/03/2017  . Phantom limb pain ([place]) 01/03/2017  . S/P unilateral BKA (below knee amputation), right ([place]) 12/30/2016  . Acute on chronic combined systolic and diastolic CHF (congestive heart failure) ([place]) 12/22/2016  . Anemia 12/22/2016  . Palliative care encounter   . Lower extremity pain, right   . Acute GI bleeding 10/23/2016  . Paroxysmal atrial fibrillation ([place]) 10/03/2016  . Atherosclerosis of autologous vein bypass graft of left lower extremity with rest pain ([place]) 09/24/2016  . PAD (peripheral artery disease) ([place]) 09/05/2016  . Groin pain 04/02/2015  . Aftercare following surgery of the circulatory system, [place] 12/13/2013  . Shingles 10/26/2013  . Anxiety  10/26/2013  . Acute blood loss anemia 09/05/2013  . Elbow fracture, left 09/05/2013  . Left acetabular fracture ([place]) 09/05/2013  . Ankle fracture, left 09/05/2013  . HTN (hypertension) 09/03/2013  . HLD (hyperlipidemia) 09/03/2013  . Peripheral vascular disease, unspecified ([place]) 05/10/2012  . Chronic total occlusion of artery of the extremities ([place]) 01/19/2012    Past Surgical History:  Procedure Laterality Date  . ABDOMINAL AORTAGRAM N/A 12/26/2014   Procedure: ABDOMINAL Maxcine Ham;  Surgeon: Serafina Mitchell, MD;  Location: [facility] CATH LAB;  Service: Cardiovascular;  Laterality: N/A;  . AMPUTATION Right 12/23/2016   Procedure: AMPUTATION BELOW KNEE;  Surgeon: Serafina Mitchell, MD;  Location: [facility];  Service: Vascular;  Laterality: Right;  . AORTOGRAM N/A 09/26/2016   Procedure: AORTOGRAM;  Surgeon: Waynetta Sandy, MD;  Location: [facility];  Service: Vascular;  Laterality: N/A;  . CARPAL TUNNEL RELEASE Right   . CATARACT EXTRACTION W/ INTRAOCULAR LENS  IMPLANT, BILATERAL Bilateral   . COLONOSCOPY    . DILATION AND CURETTAGE OF UTERUS    . DRESSING CHANGE UNDER ANESTHESIA Right 01/16/2017   Procedure: DRESSING CHANGE RIGHT BELOW KNEE AMPUTATION;  Surgeon: Serafina Mitchell, MD;  Location: [facility];  Service: Vascular;  Laterality: Right;  . EYE SURGERY Right    "macular OR"  . FEMORAL ARTERY STENT  12-11-10   Left SFA  . FEMORAL-POPLITEAL BYPASS GRAFT Left 09/24/2016   Procedure: REDO FEMORAL TO POPLITEAL ARTERY BYPASS GRAFT USING 6MM PROPATEN RINGED GORTEX GRAFT;  Surgeon: Serafina Mitchell, MD;  Location: [facility];  Service: Vascular;  Laterality: Left;  . FEMORAL-TIBIAL BYPASS GRAFT Left 01/16/2017   Procedure: REDO BYPASS GRAFT FEMORAL-TIBIAL ARTERY WITH GORTEX GRAFT;  Surgeon: Serafina Mitchell, MD;  Location: [facility];  Service: Vascular;  Laterality: Left;  AND LOWER LEG   . I & D EXTREMITY Right 12/17/2016   Procedure: IRRIGATION AND DEBRIDEMENT RIGHT FOOT;  Surgeon: Serafina Mitchell, MD;   Location: [facility];  Service: Vascular;  Laterality: Right;  . INCISION AND DRAINAGE OF WOUND Left 10/25/2009   leg/notes 11/13/2009  . INSERTION OF ILIAC STENT Left 12/26/2014   Procedure: INSERTION OF ILIAC STENT;  Surgeon: Serafina Mitchell, MD;  Location: [facility] CATH LAB;  Service: Cardiovascular;  Laterality: Left;  . INSERTION OF ILIAC STENT Left 09/26/2016   Procedure: SUB INTIMAL INSERTION OF SUPERFICIAL FEMORAL ARTERY AND BELOW KNEE BYPASS GRAFT;  Surgeon: Waynetta Sandy, MD;  Location: [facility];  Service: Vascular;  Laterality: Left;  . JOINT REPLACEMENT     knee  . JOINT REPLACEMENT Left Oct. 17, 2014   Elbow ( pt fell 08-31-13 )  . LOWER EXTREMITY ANGIOGRAM Left 12/27/2019   Procedure: Intraoperative Left Lower Extremity Angiogram;  Surgeon: Angelia Mould, MD;  Location: [facility];  Service:  Vascular;  Laterality: Left;  . LOWER EXTREMITY ANGIOGRAPHY N/A 09/21/2018   Procedure: LOWER EXTREMITY ANGIOGRAPHY;  Surgeon: Serafina Mitchell, MD;  Location: [facility] CV LAB;  Service: Cardiovascular;  Laterality: N/A;  . ORIF SHOULDER FRACTURE Right    "it was crushed"  . PERIPHERAL VASCULAR CATHETERIZATION N/A 10/30/2015   Procedure: Abdominal Aortogram w/Lower Extremity;  Surgeon: Serafina Mitchell, MD;  Location: [facility] CV LAB;  Service: Cardiovascular;  Laterality: N/A;  . PERIPHERAL VASCULAR CATHETERIZATION  10/30/2015   Procedure: Peripheral Vascular Intervention;  Surgeon: Serafina Mitchell, MD;  Location: [facility] CV LAB;  Service: Cardiovascular;;  . PERIPHERAL VASCULAR CATHETERIZATION N/A 04/01/2016   Procedure: Abdominal Aortogram w/Lower Extremity;  Surgeon: Serafina Mitchell, MD;  Location: [facility] CV LAB;  Service: Cardiovascular;  Laterality: N/A;  . PERIPHERAL VASCULAR CATHETERIZATION Left 04/01/2016   Procedure: Peripheral Vascular Atherectomy;  Surgeon: Serafina Mitchell, MD;  Location: [facility] CV LAB;  Service: Cardiovascular;  Laterality: Left;  Superficial  femoral artery.  Marland Kitchen PERIPHERAL VASCULAR CATHETERIZATION Right 09/05/2016   "stent"  . PERIPHERAL VASCULAR CATHETERIZATION N/A 09/05/2016   Procedure: Abdominal Aortogram w/Lower Extremity;  Surgeon: Serafina Mitchell, MD;  Location: [facility] CV LAB;  Service: Cardiovascular;  Laterality: N/A;  . PERIPHERAL VASCULAR CATHETERIZATION Right 09/05/2016   Procedure: Peripheral Vascular Intervention;  Surgeon: Serafina Mitchell, MD;  Location: [facility] CV LAB;  Service: Cardiovascular;  Laterality: Right;  Superficial Femoral  . PERIPHERAL VASCULAR CATHETERIZATION Left 09/09/2016   Procedure: Lower Extremity Angiography;  Surgeon: Serafina [place], MD;  Location: [facility] CV LAB;  Service: Cardiovascular;  Laterality: Left;  . PR VEIN BYPASS GRAFT,AORTO-FEM-POP  09-13-09   Left Fem-pop  . THROMBECTOMY FEMORAL ARTERY Right 09/26/2016   Procedure: Thromboembolectomy Right Lower Extremity, Right Femoral Artery Endarterectomy with Patch Angioplasty; Right Lower Extremity Angiogram ;  Surgeon: Waynetta Sandy, MD;  Location: [facility];  Service: Vascular;  Laterality: Right;  . THROMBECTOMY FEMORAL ARTERY Left 12/27/2019   Procedure: Redo left femoral artery exposure, Thrombectomy of left deep femoral artery to anterior tibial artery bypass;  Surgeon: Angelia Mould, MD;  Location: [facility] OR;  Service: Vascular;  Laterality: Left;  . TOTAL ELBOW ARTHROPLASTY Left 09/03/2013   Procedure: LEFT TOTAL ELBOW ARTHROPLASTY;  Surgeon: Roseanne Kaufman, MD;  Location: [facility];  Service: Orthopedics;  Laterality: Left;  . TOTAL KNEE ARTHROPLASTY Left 06/2006  . TRANSMETATARSAL AMPUTATION Right 12/11/2016   Procedure: TRANSMETATARSAL AMPUTATION;  Surgeon: Serafina [place], MD;  Location: [facility];  Service: Vascular;  Laterality: Right;  . TUBAL LIGATION    . VAGINAL HYSTERECTOMY       OB History   No obstetric history on file.     Family History  Problem Relation Age of Onset  . Heart disease Father         Heart Disease before age 38  . Hyperlipidemia Father   . Hypertension Father   . Alcohol abuse Father   . Heart disease Brother   . Hyperlipidemia Brother   . Hypertension Brother   . Deep vein thrombosis Brother   . Heart disease Son        Heart Disease before age 75  . Hyperlipidemia Son   . Hypertension Son   . Heart attack Son   . Diabetes Son   . Hypertension Son   . Hyperlipidemia Sister   . Hypertension Sister     Social History   Tobacco Use  . Smoking  status: Former Smoker    Types: Cigarettes    Quit date: 11/17/1946    Years since quitting: 73.5  . Smokeless tobacco: Never Used  . Tobacco comment: "never smoked much"  Vaping Use  . Vaping Use: Never used  Substance Use Topics  . Alcohol use: No    Alcohol/week: 0.0 standard drinks  . Drug use: No    Home Medications Prior to Admission medications   Medication Sig Start Date End Date Taking? Authorizing Provider  acetaminophen (TYLENOL) 500 MG tablet Take 1,000 mg by mouth 2 (two) times daily as needed for moderate pain or headache. Patient took this medication for her pain.   Yes [provider]  Alirocumab (PRALUENT) 75 MG/ML SOAJ Inject 1 pen into the skin every 14 (fourteen) days. 04/23/20  Yes Belva Crome, MD  amiodarone (PACERONE) 200 MG tablet Take 1 tablet (200 mg total) by mouth daily. Please keep upcoming appt in May before anymore refills. Final Attempt Patient taking differently: Take 200 mg by mouth daily.  03/09/20  Yes Belva Crome, MD  Cholecalciferol (VITAMIN D3) 125 MCG (5000 UT) CAPS Take 400 Units by mouth at bedtime.    Yes [provider]  clopidogrel (PLAVIX) 75 MG tablet Take 1 tablet (75 mg total) by mouth daily. 01/28/17  Yes Angiulli, Lavon Paganini, PA-C  Cyanocobalamin (VITAMIN B-12) 5000 MCG TBDP Take 5,000 mcg by mouth daily.    Yes [provider]  diazepam (VALIUM) 5 MG tablet Take 0.5 tablets (2.5 mg total) by mouth at bedtime as needed for  anxiety. Patient taking differently: Take 2.5 mg by mouth at bedtime as needed for anxiety or sedation.  01/28/17  Yes Angiulli, Lavon Paganini, PA-C  furosemide (LASIX) 20 MG tablet Take 20 mg by mouth every Monday, Wednesday, and Friday.   Yes [provider]  gabapentin (NEURONTIN) 300 MG capsule Take 300 mg by mouth 3 (three) times daily.    Yes [provider]  loperamide (IMODIUM) 2 MG capsule Take 2 mg by mouth as needed for diarrhea or loose stools.   Yes [provider]  metoprolol tartrate (LOPRESSOR) 25 MG tablet Take 1 tablet (25 mg total) by mouth at bedtime. 01/28/17  Yes Angiulli, Lavon Paganini, PA-C  Multiple Vitamins-Minerals (EYE VITAMINS PO) Take 1 tablet by mouth in the morning and at bedtime.    Yes [provider]  Multiple Vitamins-Minerals (IMMUNE SUPPORT VITAMIN C PO) Take 1 tablet by mouth 2 (two) times daily.    Yes [provider]  oxycodone (OXY-IR) 5 MG capsule Take 1 capsule (5 mg total) by mouth every 6 (six) hours as needed for pain. 12/31/19  Yes Rhyne, Samantha J, PA-C  potassium chloride (K-DUR,KLOR-CON) 10 MEQ tablet Take 10 mEq by mouth daily.   Yes [provider]  valsartan-hydrochlorothiazide (DIOVAN-HCT) 160-12.5 MG tablet Take 0.5 tablets by mouth daily. 01/28/17  Yes Angiulli, Lavon Paganini, PA-C    Allergies    Motrin [ibuprofen], Statins, Eliquis [apixaban], Morphine and related, Promethazine hcl, and Sulfa antibiotics  Review of Systems   Review of Systems  All other systems reviewed and are negative.   Physical Exam Updated Vital Signs BP (!) 129/49   Pulse 63   Temp 98.9 F (37.2 C) (Oral)   Resp 18   Ht 1.575 m (5\' 2" )   Wt 59.9 kg   SpO2 95%   BMI 24.14 kg/m   Physical Exam Vitals and nursing note reviewed.  Constitutional:  Appearance: She is well-developed. She is not toxic-appearing or diaphoretic.  HENT:     Head: Normocephalic and atraumatic.     Right Ear: External ear normal.      Left Ear: External ear normal.  Eyes:     General: No scleral icterus.       Right eye: No discharge.        Left eye: No discharge.     Conjunctiva/sclera: Conjunctivae normal.  Neck:     Trachea: No tracheal deviation.  Cardiovascular:     Rate and Rhythm: Normal rate.  Pulmonary:     Effort: Pulmonary effort is normal. No respiratory distress.     Breath sounds: Normal breath sounds. No stridor.  Abdominal:     General: Abdomen is flat. There is no distension.     Palpations: Abdomen is soft.     Tenderness: There is no abdominal tenderness.  Musculoskeletal:        General: No swelling or deformity.     Cervical back: Neck supple.     Comments: Decreased perfusion of the left foot, dark discoloration of the skin, pulse not palpable, unable to dopplerable pulse, foot is cooler compared to the calf  Skin:    General: Skin is warm and dry.     Findings: No rash.  Neurological:     General: No focal deficit present.     Mental Status: She is alert and oriented to person, place, and time.     Cranial Nerves: Cranial nerve deficit: no gross deficits.     ED Results / Procedures / Treatments   Labs (all labs ordered are listed, but only abnormal results are displayed) Labs Reviewed  CBC WITH DIFFERENTIAL/PLATELET - Abnormal; Notable for the following components:      Result Value   Abs Immature Granulocytes 0.14 (*)    All other components within normal limits  BASIC METABOLIC PANEL - Abnormal; Notable for the following components:   BUN 38 (*)    Creatinine, Ser 1.48 (*)    GFR calc non Af Amer 31 (*)    GFR calc Af Amer 36 (*)    All other components within normal limits  SARS CORONAVIRUS 2 BY RT PCR (HOSPITAL ORDER, [facility] LAB)  PROTIME-INR    EKG None  Radiology No results found.  Procedures Procedures (including critical care time)  Medications Ordered in ED Medications  fentaNYL (SUBLIMAZE) injection 50 mcg (50 mcg Intravenous  Given 05/04/20 1028)    ED Course  I have reviewed the triage vital signs and the nursing notes.  Pertinent labs & imaging results that were available during my care of the patient were reviewed by me and considered in my medical decision making (see chart for details).  Clinical Course as of May 05 1147  Fri May 04, 2020  1008 Patient symptoms are concerning for acute arterial occlusion.  Patient called her vascular surgeon and they are expecting her in the ED.  Will check labs.   [JK]    Clinical Course User Index [JK] Dorie Rank, MD   MDM Rules/Calculators/A&P                          Pt's symptoms are concerning for acute on chronic arterial occlusion.  Laboratory tests reviewed.  No significant abnormality.  Covid screening is negative.  I discussed the case with vascular surgery, Dr. Donzetta Matters.  He is aware that the patient is  in the ED and will be coming to evaluate the patient. Final Clinical Impression(s) / ED Diagnoses Final diagnoses:  Arterial occlusion, lower extremity ([place])      Dorie Rank, MD 05/04/20 1150

## 2020-05-05 LAB — CBC
HCT: 27.3 % — ABNORMAL LOW (ref 36.0–46.0)
Hemoglobin: 8.7 g/dL — ABNORMAL LOW (ref 12.0–15.0)
MCH: 30.5 pg (ref 26.0–34.0)
MCHC: 31.9 g/dL (ref 30.0–36.0)
MCV: 95.8 fL (ref 80.0–100.0)
Platelets: 178 10*3/uL (ref 150–400)
RBC: 2.85 MIL/uL — ABNORMAL LOW (ref 3.87–5.11)
RDW: 15.2 % (ref 11.5–15.5)
WBC: 9.6 10*3/uL (ref 4.0–10.5)
nRBC: 0 % (ref 0.0–0.2)

## 2020-05-05 LAB — COMPREHENSIVE METABOLIC PANEL
ALT: 9 U/L (ref 0–44)
AST: 15 U/L (ref 15–41)
Albumin: 3 g/dL — ABNORMAL LOW (ref 3.5–5.0)
Alkaline Phosphatase: 48 U/L (ref 38–126)
Anion gap: 6 (ref 5–15)
BUN: 31 mg/dL — ABNORMAL HIGH (ref 8–23)
CO2: 23 mmol/L (ref 22–32)
Calcium: 8.2 mg/dL — ABNORMAL LOW (ref 8.9–10.3)
Chloride: 107 mmol/L (ref 98–111)
Creatinine, Ser: 1.6 mg/dL — ABNORMAL HIGH (ref 0.44–1.00)
GFR calc Af Amer: 33 mL/min — ABNORMAL LOW (ref >60)
GFR calc non Af Amer: 28 mL/min — ABNORMAL LOW (ref >60)
Glucose, Bld: 110 mg/dL — ABNORMAL HIGH (ref 70–99)
Potassium: 4.5 mmol/L (ref 3.5–5.1)
Sodium: 136 mmol/L (ref 135–145)
Total Bilirubin: 0.5 mg/dL (ref 0.3–1.2)
Total Protein: 5.3 g/dL — ABNORMAL LOW (ref 6.5–8.1)

## 2020-05-05 LAB — HEPARIN LEVEL (UNFRACTIONATED): Heparin Unfractionated: 0.4 IU/mL (ref 0.30–0.70)

## 2020-05-05 NOTE — Progress Notes (Addendum)
ANTICOAGULATION CONSULT NOTE - Initial Consult  Pharmacy Consult:  Heparin Indication:  Limb ischemia   Allergies  Allergen Reactions  . Motrin [Ibuprofen] Other (See Comments)    ADVERSE REACTION - GI BLEED  . Statins Other (See Comments)    ADVERSE REACTION MUSCLE PAIN & WEAKNESS  . Eliquis [Apixaban] Other (See Comments)    Lower GI bleeding  . Morphine And Related Other (See Comments)    HALLUCINATIONS REACTION IS SIDE EFFECT  . Promethazine Hcl Other (See Comments)    IV  Drug only, makes her act crazy  . Sulfa Antibiotics Nausea And Vomiting    Patient Measurements: Height: 5\' 2"  (157.5 cm) Weight: 62.7 kg (138 lb 3.2 oz) IBW/kg (Calculated) : 50.1 Heparin Dosing Weight: 62 kg  Vital Signs: Temp: 98.2 F (36.8 C) (06/19 0612) Temp Source: Oral (06/19 0612) BP: 98/46 (06/19 0612) Pulse Rate: 66 (06/19 0612)  Labs: Recent Labs    05/04/20 1011  HGB 12.3  HCT 39.1  PLT 223  LABPROT 13.2  INR 1.0  CREATININE 1.48*    Estimated Creatinine Clearance: 22.4 mL/min (A) (by C-G formula based on SCr of 1.48 mg/dL (H)).   Medical History: Past Medical History:  Diagnosis Date  . Anemia   . Anginal pain ([place])   . Arthritis    "qwhere" (09/05/2016)  . Atrial fibrillation ([place]) 09/2016  . Chronic lower back pain   . Complication of anesthesia    "takes a long time for it to wear off; I can hallucinate if I take too much" (09/05/2016)  . DVT (deep venous thrombosis) ([place]) 10/2009  . Fall from steps 08/31/2013   Fx. pelvis, Left Hip, Left Elbow  . Fibromyalgia   . GERD (gastroesophageal reflux disease)    09/22/16- "no too much anymore"  . GI bleed 10/24/2016  . High cholesterol   . History of blood transfusion   . History of hiatal hernia   . Hypertension   . Macular degeneration, wet ([place])    "started in right eye; now legally blind in that eye; now started in left eye but pretty much in control" (09/05/2016)  . Osteoporosis   . Peripheral vascular  disease ([place])    nonviable tissue Right foot  . PONV (postoperative nausea and vomiting)   . Squamous cell carcinoma of skin of right calf Aug. 2015  . Stroke ([place])    TIA's no residual  . TIA (transient ischemic attack)    "several at once; none in a long time" (09/05/2016)    Assessment: 35 YOF presented with LLE pain and discoloration, now s/p redo of bypass graft and thrombectomy 6/18.  Pharmacy consulted to start IV heparin specifically at 600 units/hour with no bolus.  No AC PTA.  9 hour heparin level is therapeutic at 0.4 on 600 units/hour. H&H is decreased to 8.7/27.3, plts wnl at 178. Please note patient had surgery yesterday with 600 ml of blood loss documented and that is likely the reason behind decreased H&H. No bleeding noted today.    Goal of Therapy:  Heparin level 0.3-0.7 units/ml Monitor platelets by anticoagulation protocol: Yes   Plan:  Continue IV heparin at 600 units/hr Confirmatory heparin level at 1600 Daily heparin level and CBC Monitor for bleeding, plans for oral anticoagulation   **1115 addendum** MD discontinued heparin consult and labs, states in note to discontinue heparin. Heparin infusion discontinued.   Thank you,   Eddie Candle, PharmD PGY-1 Pharmacy Resident   Please check amion for clinical  pharmacist contact number

## 2020-05-05 NOTE — Progress Notes (Addendum)
  Progress Note    05/05/2020 8:34 AM 1 Day Post-Op  Subjective:  L foot feels better than before surgery    Vitals:   05/05/20 0612 05/05/20 0812  BP: (!) 98/46 (!) 104/57  Pulse: 66 64  Resp: 20 17  Temp: 98.2 F (36.8 C) 98.7 F (37.1 C)  SpO2: 93% 97%   Physical Exam: Lungs:  Non labored  Incisions:  L LE incision without bleeding or hematoma Extremities:  L calf soft; L DP by doppler Neurologic: A&O  CBC    Component Value Date/Time   WBC 8.1 05/04/2020 1011   RBC 4.10 05/04/2020 1011   HGB 12.3 05/04/2020 1011   HCT 39.1 05/04/2020 1011   PLT 223 05/04/2020 1011   MCV 95.4 05/04/2020 1011   MCH 30.0 05/04/2020 1011   MCHC 31.5 05/04/2020 1011   RDW 15.0 05/04/2020 1011   LYMPHSABS 3.0 05/04/2020 1011   MONOABS 0.8 05/04/2020 1011   EOSABS 0.3 05/04/2020 1011   BASOSABS 0.1 05/04/2020 1011    BMET    Component Value Date/Time   NA 137 05/04/2020 1011   K 4.2 05/04/2020 1011   CL 101 05/04/2020 1011   CO2 24 05/04/2020 1011   GLUCOSE 97 05/04/2020 1011   BUN 38 (H) 05/04/2020 1011   CREATININE 1.48 (H) 05/04/2020 1011   CALCIUM 9.2 05/04/2020 1011   GFRNONAA 31 (L) 05/04/2020 1011   GFRAA 36 (L) 05/04/2020 1011    INR    Component Value Date/Time   INR 1.0 05/04/2020 1011     Intake/Output Summary (Last 24 hours) at 05/05/2020 0834 Last data filed at 05/05/2020 0543 Gross per 24 hour  Intake 2392 ml  Output 1515 ml  Net 877 ml     Assessment/Plan:  86 y.o. female is s/p thrombectomy LLE 1 Day Post-Op   Patent LLE bypass with DP signal by doppler Continue heparin for now Patient is aware she is at high risk for recurrent thrombosis and subsequent amputation   Dagoberto Ligas, PA-C [facility] [phone] 05/05/2020 8:34 AM  I have independently interviewed and examined patient and agree with PA assessment and plan above.  Patient foot is very warm sensorimotor intact there is a good anterior tibial signal at  the ankle.  We will discontinue heparin today continue Plavix possible discharge tomorrow.  Joley Utecht C. Donzetta Matters, MD [facility] [place] [facility] Office: [phone] Pager: [phone]

## 2020-05-05 NOTE — Progress Notes (Signed)
PHARMACIST LIPID MONITORING  [name] is a 86 y.o. female admitted on 05/04/2020 with PAD/ischemic left leg and need for left femoral bypass graft re-do.  Pharmacy has been consulted to optimize lipid-lowering therapy with the indication of secondary prevention for clinical ASCVD.  Recent Labs:  Lipid Panel (last 6 months):   Lab Results  Component Value Date   CHOL 338 (H) 04/20/2020   TRIG 420 (H) 04/20/2020   HDL 37 (L) 04/20/2020   CHOLHDL 9.1 (H) 04/20/2020   LDLCALC 212 (H) 04/20/2020   LDLDIRECT 226 (H) 04/20/2020    Hepatic function panel (last 6 months):   AST 16, ALT 12, ALKPHOS 69, BILITOT 0.8  SCr (since admission):   Serum creatinine: 1.48 mg/dL (H) 05/04/20 1011 Estimated creatinine clearance: 22.4 mL/min (A)  Current lipid-lowering therapy: alirocumab (Praluent) Previous lipid-lowering therapies (if applicable): rosuvastatin, atorvastatin, simvastatin, and pravastatin Documented or reported allergies or intolerances to lipid-lowering therapies (if applicable): rosuvastatin, atorvastatin, simvastatin, and pravastatin - severe leg pain and weakness that improved after discontinuing each statin.   Assessment:  Will not make changes to patient's lipid lowering regimen at this moment. Though LDL is above goal at 226, she follows up with the lipid clinic as an outpatient (last visit 04/20/2020) and was recently initiated on Praluent (last filled 04/23/2020). Per medication history, last dose was 6/15. This newly started medication will be monitored by the lipid clinic and has not been administered long enough to re-check lipid panel.   Recommendation per protocol:  Continue current lipid-lowering therapy.  Follow-up with:  Lipid clinic  Follow-up labs after discharge:    Liver function panel and lipid panel in 8-12 weeks then annually per lipid clinic  Plan: - Continue no statin trials due to patient's many intolerances - Continue on outpatient Praluent - Continue  to follow up with lipid clinic who will monitor Praluent therapy   Thank you,   Eddie Candle, PharmD PGY-1 Pharmacy Resident   Please check amion for clinical pharmacist contact number

## 2020-05-06 LAB — POCT I-STAT 7, (LYTES, BLD GAS, ICA,H+H)
Acid-base deficit: 1 mmol/L (ref 0.0–2.0)
Bicarbonate: 23.9 mmol/L (ref 20.0–28.0)
Calcium, Ion: 1.21 mmol/L (ref 1.15–1.40)
HCT: 30 % — ABNORMAL LOW (ref 36.0–46.0)
Hemoglobin: 10.2 g/dL — ABNORMAL LOW (ref 12.0–15.0)
O2 Saturation: 100 %
Patient temperature: 36.3
Potassium: 3.6 mmol/L (ref 3.5–5.1)
Sodium: 141 mmol/L (ref 135–145)
TCO2: 25 mmol/L (ref 22–32)
pCO2 arterial: 39.9 mmHg (ref 32.0–48.0)
pH, Arterial: 7.383 (ref 7.350–7.450)
pO2, Arterial: 182 mmHg — ABNORMAL HIGH (ref 83.0–108.0)

## 2020-05-06 LAB — CBC
HCT: 24.1 % — ABNORMAL LOW (ref 36.0–46.0)
Hemoglobin: 7.4 g/dL — ABNORMAL LOW (ref 12.0–15.0)
MCH: 30 pg (ref 26.0–34.0)
MCHC: 30.7 g/dL (ref 30.0–36.0)
MCV: 97.6 fL (ref 80.0–100.0)
Platelets: 154 10*3/uL (ref 150–400)
RBC: 2.47 MIL/uL — ABNORMAL LOW (ref 3.87–5.11)
RDW: 15.7 % — ABNORMAL HIGH (ref 11.5–15.5)
WBC: 8.8 10*3/uL (ref 4.0–10.5)
nRBC: 0 % (ref 0.0–0.2)

## 2020-05-06 LAB — POCT ACTIVATED CLOTTING TIME
Activated Clotting Time: 340 seconds
Activated Clotting Time: 274 seconds
Activated Clotting Time: 296 seconds

## 2020-05-06 MED ORDER — OXYCODONE HCL 5 MG PO CAPS: 5.0000 mg | ORAL_CAPSULE | Freq: Four times a day (QID) | ORAL | 0 refills | Status: DC | PRN

## 2020-05-06 NOTE — Discharge Summary (Addendum)
Discharge Summary  Patient ID: [name] [id number] 86 y.o. 06/15/1928  Admit date: 05/04/2020  Discharge date and time: 05/06/20   Admitting Physician: Serafina Mitchell, MD   Discharge Physician: Dr. Donzetta Matters  Admission Diagnoses: Arterial occlusion, lower extremity ([place]) [I70.209] Thrombosis [I82.90] Ischemic leg [I99.8]  Discharge Diagnoses: same  Admission Condition: poor  Discharged Condition: fair  Indication for Admission: Ischemic left lower extremity  Hospital Course: [name] is a 86 year old female who presented to the emergency department on 05/04/2020 with an ischemic left lower extremity and thrombosed left lower extremity bypass graft.  She was started on heparin drip and taken emergently to the operating room and underwent thrombectomy of left lower extremity bypass graft with intra-bypass stent near proximal anastomosis and angioplasty of anterior tibial artery by [name] on 05/04/2020.  She tolerated this procedure well and remained on heparin postoperatively.  POD #1 heparin was discontinued and she restarted her Plavix.  Throughout her hospital stay she maintained a brisk left DP signal by Doppler.  She will follow-up with [name] in 2 to 3 weeks.  She will be prescribed 2 to 3 days of narcotic pain medication for continued postoperative pain control.  She will be discharged this morning in stable condition.  Consults: None  Treatments: surgery: Thrombectomy of left lower extremity bypass graft with intra-bypass stent near proximal anastomosis and angioplasty of anterior tibial artery by [name] on 05/04/2020.    Discharge Exam: See progress note 05/06/2020 Vitals:   05/06/20 0400 05/06/20 0800  BP: (!) 111/42 (!) 120/44  Pulse: 74 77  Resp: 19 20  Temp: 98.7 F (37.1 C) 98.3 F (36.8 C)  SpO2: 92% 94%     Disposition: Discharge disposition: 01-Home or Self Care       Patient Instructions:  Allergies as of 05/06/2020       Reactions   Motrin [ibuprofen] Other (See Comments)   ADVERSE REACTION - GI BLEED   Statins Other (See Comments)   ADVERSE REACTION MUSCLE PAIN & WEAKNESS   Eliquis [apixaban] Other (See Comments)   Lower GI bleeding   Morphine And Related Other (See Comments)   HALLUCINATIONS REACTION IS SIDE EFFECT   Promethazine Hcl Other (See Comments)   IV  Drug only, makes her act crazy   Sulfa Antibiotics Nausea And Vomiting      Medication List    TAKE these medications   acetaminophen 500 MG tablet Commonly known as: TYLENOL Take 1,000 mg by mouth every 8 (eight) hours as needed for mild pain.   amiodarone 200 MG tablet Commonly known as: PACERONE Take 1 tablet (200 mg total) by mouth daily. Please keep upcoming appt in May before anymore refills. Final Attempt What changed: additional instructions   clopidogrel 75 MG tablet Commonly known as: PLAVIX Take 1 tablet (75 mg total) by mouth daily.   diazepam 5 MG tablet Commonly known as: VALIUM Take 0.5 tablets (2.5 mg total) by mouth at bedtime as needed for anxiety. What changed: reasons to take this   furosemide 20 MG tablet Commonly known as: LASIX Take 20 mg by mouth every Monday, Wednesday, and Friday.   gabapentin 300 MG capsule Commonly known as: NEURONTIN Take 300 mg by mouth See admin instructions. Take 300mg  by mouth three times daily.  May take an additional 300mg  at bedtime as needed for pain.   IMMUNE SUPPORT VITAMIN C PO Take 1 tablet by mouth 2 (two) times daily.   PRESERVISION AREDS PO Take  1 capsule by mouth in the morning and at bedtime.   loperamide 2 MG capsule Commonly known as: IMODIUM Take 2 mg by mouth See admin instructions. Take 2mg  by mouth as needed after each loose stool.  Do not exceed 8 capsules in 24 hours.   metoprolol tartrate 25 MG tablet Commonly known as: LOPRESSOR Take 1 tablet (25 mg total) by mouth at bedtime.   oxycodone 5 MG capsule Commonly known as: OXY-IR Take 1 capsule  (5 mg total) by mouth every 6 (six) hours as needed for pain. What changed: when to take this   potassium chloride 10 MEQ tablet Commonly known as: KLOR-CON Take 10 mEq by mouth daily.   Praluent 75 MG/ML [place] Generic drug: Alirocumab Inject 1 pen into the skin every 14 (fourteen) days.   valsartan-hydrochlorothiazide 160-12.5 MG tablet Commonly known as: DIOVAN-HCT Take 0.5 tablets by mouth daily.   VITAMIN B-12 PO Place 1 tablet under the tongue daily.   VITAMIN D PO Take 1 capsule by mouth at bedtime.      Activity: activity as tolerated Diet: regular diet Wound Care: keep wound clean and dry  Follow-up with [name] in 2 weeks.  Signed: Dagoberto Ligas, PA-C 05/06/2020 8:21 AM [facility] Office: [phone]

## 2020-05-06 NOTE — Progress Notes (Addendum)
  Progress Note    05/06/2020 7:54 AM 2 Days Post-Op  Subjective:  Wants to go home today   Vitals:   05/06/20 0000 05/06/20 0400  BP: (!) 108/52 (!) 111/42  Pulse: 77 74  Resp: 19 19  Temp: 98.7 F (37.1 C) 98.7 F (37.1 C)  SpO2: 91% 92%   Physical Exam: Lungs:  Non labored Incisions:  LLE incision c/d/i, some fullness but not firm Extremities:  Brisk L DP by doppler Neurologic: A&O  CBC    Component Value Date/Time   WBC 8.8 05/06/2020 0332   RBC 2.47 (L) 05/06/2020 0332   HGB 7.4 (L) 05/06/2020 0332   HCT 24.1 (L) 05/06/2020 0332   PLT 154 05/06/2020 0332   MCV 97.6 05/06/2020 0332   MCH 30.0 05/06/2020 0332   MCHC 30.7 05/06/2020 0332   RDW 15.7 (H) 05/06/2020 0332   LYMPHSABS 3.0 05/04/2020 1011   MONOABS 0.8 05/04/2020 1011   EOSABS 0.3 05/04/2020 1011   BASOSABS 0.1 05/04/2020 1011    BMET    Component Value Date/Time   NA 136 05/05/2020 0809   K 4.5 05/05/2020 0809   CL 107 05/05/2020 0809   CO2 23 05/05/2020 0809   GLUCOSE 110 (H) 05/05/2020 0809   BUN 31 (H) 05/05/2020 0809   CREATININE 1.60 (H) 05/05/2020 0809   CALCIUM 8.2 (L) 05/05/2020 0809   GFRNONAA 28 (L) 05/05/2020 0809   GFRAA 33 (L) 05/05/2020 0809    INR    Component Value Date/Time   INR 1.0 05/04/2020 1011     Intake/Output Summary (Last 24 hours) at 05/06/2020 0754 Last data filed at 05/05/2020 2115 Gross per 24 hour  Intake 648.03 ml  Output 200 ml  Net 448.03 ml     Assessment/Plan:  86 y.o. female is s/p thrombectomy LLE 2 Days Post-Op   L foot well perfused with brisk DP signal by doppler Ok for discharge home this morning Follow up with [name] in 2-3 weeks    Dagoberto Ligas, PA-C [facility] [phone] 05/06/2020 7:54 AM  I have independently interviewed and examined patient and agree with PA assessment and plan above.  Palpable anterior tibial artery pulse off of heparin.  Plan for discharge today.  Ellaree Gear C. Donzetta Matters,  MD [facility] [place] Office: [phone] Pager: [phone]

## 2020-05-06 NOTE — Progress Notes (Signed)
Mobility Specialist: Progress Note    05/06/20 1406  Mobility  Activity Transferred:  Bed to chair  Level of Assistance Maximum assist, patient does 25-49%  Assistive Device None  Mobility Response Tolerated well  Mobility performed by Mobility specialist  Bed Position Chair  $Mobility charge 1 Mobility   Pre-Mobility: 83 HR, 97% SpO2  Psychologist, clinical

## 2020-05-06 NOTE — Discharge Instructions (Signed)
 [facility]   Discharge instructions  Lower Extremity Bypass Surgery  Please refer to the following instruction for your post-procedure care. Your surgeon or physician assistant will discuss any changes with you.  Activity  You are encouraged to walk as much as you can. You can slowly return to normal activities during the month after your surgery. Avoid strenuous activity and heavy lifting until your doctor tells you it's OK. Avoid activities such as vacuuming or swinging a golf club. Do not drive until your doctor give the OK and you are no longer taking prescription pain medications. It is also normal to have difficulty with sleep habits, eating and bowel movement after surgery. These will go away with time.  Bathing/Showering  You may shower after you go home. Do not soak in a bathtub, hot tub, or swim until the incision heals completely.  Incision Care  Clean your incision with mild soap and water. Shower every day. Pat the area dry with a clean towel. You do not need a bandage unless otherwise instructed. Do not apply any ointments or creams to your incision. If you have open wounds you will be instructed how to care for them or a visiting nurse may be arranged for you. If you have staples or sutures along your incision they will be removed at your post-op appointment. You may have skin glue on your incision. Do not peel it off. It will come off on its own in about one week. If you have a great deal of moisture in your groin, use a gauze help keep this area dry.  Diet  Resume your normal diet. There are no special food restrictions following this procedure. A low fat/ low cholesterol diet is recommended for all patients with vascular disease. In order to heal from your surgery, it is CRITICAL to get adequate nutrition. Your body requires vitamins, minerals, and protein. Vegetables are the best source of vitamins and minerals. Vegetables also provide the  perfect balance of protein. Processed food has little nutritional value, so try to avoid this.  Medications  Resume taking all your medications unless your doctor or nurse practitioner tells you not to. If your incision is causing pain, you may take over-the-counter pain relievers such as acetaminophen (Tylenol). If you were prescribed a stronger pain medication, please aware these medication can cause nausea and constipation. Prevent nausea by taking the medication with a snack or meal. Avoid constipation by drinking plenty of fluids and eating foods with high amount of fiber, such as fruits, vegetables, and grains. Take Colase 100 mg (an over-the-counter stool softener) twice a day as needed for constipation. Do not take Tylenol if you are taking prescription pain medications.  Follow Up  Our office will schedule a follow up appointment 2-3 weeks following discharge.  Please call us immediately for any of the following conditions  Severe or worsening pain in your legs or feet while at rest or while walking Increase pain, redness, warmth, or drainage (pus) from your incision site(s) Fever of 101 degree or higher The swelling in your leg with the bypass suddenly worsens and becomes more painful than when you were in the hospital If you have been instructed to feel your graft pulse then you should do so every day. If you can no longer feel this pulse, call the office immediately. Not all patients are given this instruction.  Leg swelling is common after leg bypass surgery.  The swelling should improve over a few months   following surgery. To improve the swelling, you may elevate your legs above the level of your heart while you are sitting or resting. Your surgeon or physician assistant may ask you to apply an ACE wrap or wear compression (TED) stockings to help to reduce swelling.  Reduce your risk of vascular disease  Stop smoking. If you would like help call [facility] at 1-800-QUIT-NOW  (1-800-784-8669) or [place] at 336-586-4000.  Manage your cholesterol Maintain a desired weight Control your diabetes weight Control your diabetes Keep your blood pressure down  If you have any questions, please call the office at [phone]   

## 2020-05-07 ENCOUNTER — Encounter (HOSPITAL_COMMUNITY): Payer: Self-pay | Admitting: Surgery

## 2020-05-07 LAB — CBC
HCT: 24.3 % — ABNORMAL LOW (ref 36.0–46.0)
Hemoglobin: 7.7 g/dL — ABNORMAL LOW (ref 12.0–15.0)
MCH: 30.6 pg (ref 26.0–34.0)
MCHC: 31.7 g/dL (ref 30.0–36.0)
MCV: 96.4 fL (ref 80.0–100.0)
Platelets: 162 10*3/uL (ref 150–400)
RBC: 2.52 MIL/uL — ABNORMAL LOW (ref 3.87–5.11)
RDW: 15.7 % — ABNORMAL HIGH (ref 11.5–15.5)
WBC: 8 10*3/uL (ref 4.0–10.5)
nRBC: 0 % (ref 0.0–0.2)

## 2020-05-07 MED ORDER — POLYETHYLENE GLYCOL 3350 17 G PO PACK
17.0000 g | PACK | Freq: Every day | ORAL | Status: DC
  Administered 2020-05-07: 17 g via ORAL
  Filled 2020-05-07: qty 1

## 2020-05-07 MED ORDER — HEPARIN SODIUM (PORCINE) 5000 UNIT/ML IJ SOLN
5000.0000 [IU] | Freq: Three times a day (TID) | INTRAMUSCULAR | Status: DC
  Administered 2020-05-07 – 2020-05-09 (×6): 5000 [IU] via SUBCUTANEOUS
  Filled 2020-05-07 (×6): qty 1

## 2020-05-07 MED ORDER — BISACODYL 10 MG RE SUPP
10.0000 mg | Freq: Every day | RECTAL | Status: DC | PRN
  Administered 2020-05-07: 10 mg via RECTAL
  Filled 2020-05-07: qty 1

## 2020-05-07 NOTE — Evaluation (Signed)
Physical Therapy Evaluation Patient Details Name: [name] MRN: [id number] DOB: 07-Mar-1928 Today's Date: 05/07/2020   History of Present Illness  Pt is a 86 y.o. 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 Presented to ED on 6/18 with acutely worsening left leg and foot pain, coolness and discoloration and found to have some inflow stenosis and low velocities in her bypass graft. Pt is s/p revascularization of LLE in 6/18.  Clinical Impression   Pt presents with LLE pain, generalized weakness with + L foot drop, impaired ability to mobilize vs baseline, and decreased activity tolerance. Pt to benefit from acute PT to address deficits. Pt required mod-max +2 for bed mobility and transfer OOB via transfer board to drop arm recliner, at baseline pt is mod I for transfers and w/c management. PT and OT spoke with pt's daughter in room, who states pt recently prior to surgery has been getting weaker and has been having more difficulty transferring. Pt and daughter interested in ST-SNF placement for return to PLOF prior to d/c home. PT recommending SNF level of care post-acutely. PT to progress mobility as tolerated, and will continue to follow acutely.      Follow Up Recommendations SNF;Supervision/Assistance - 24 hour    Equipment Recommendations  Other (comment) (transfer board - pt's slide board demonstrates significant wear)    Recommendations for Other Services       Precautions / Restrictions Precautions Precautions: Fall Precaution Comments: chronic R BKA Restrictions Weight Bearing Restrictions: No      Mobility  Bed Mobility Overal bed mobility: Needs Assistance Bed Mobility: Supine to Sit     Supine to sit: Mod assist;+2 for physical assistance;+2 for safety/equipment;HOB elevated     General bed mobility comments: Mod assist +2 for LE management, trunk elevation  with significant HOB elevation, placing LUE on contralateral handrail to assist in moving to EOB. Increased time and effort, with assist of bed pad to scoot to EOB.  Transfers Overall transfer level: Needs assistance Equipment used: Sliding board;2 person hand held assist Transfers: Lateral/Scoot Transfers          Lateral/Scoot Transfers: With slide board;Max assist;+2 physical assistance;+2 safety/equipment General transfer comment: Max +2 for slide board placement, scooting on slide board, trunk translation, and safe lift into drop arm recliner. Pt assisted by shifting body weight, hand placement. transfer towards R.  Ambulation/Gait             General Gait Details: unable - pt transfers only  Stairs            Wheelchair Mobility    Modified Rankin (Stroke Patients Only)       Balance Overall balance assessment: Needs assistance;History of Falls Sitting-balance support: Bilateral upper extremity supported Sitting balance-Leahy Scale: Fair Sitting balance - Comments: able to sit EOB ~1 minute without external assist, props on LUE as needed to rest. Posterior leaning upon initial sitting EOB requiring posterior assist to correct Postural control: Posterior lean   Standing balance-Leahy Scale: Zero Standing balance comment: slide board transfers only                             Pertinent Vitals/Pain Pain Assessment: Faces Faces Pain Scale: Hurts even more Pain Location: LLE, in foot especially Pain Descriptors / Indicators: Sore;Discomfort;Guarding Pain Intervention(s): Limited activity within patient's tolerance;Monitored during session;Repositioned  Home Living Family/patient expects to be discharged to:: Private residence Living Arrangements: Children Available Help at Discharge: Family;Available 24 hours/day Type of Home: House Home Access: Ramped entrance     Home Layout: One level Home Equipment: Wheelchair - manual;Shower  seat;Bedside commode;Other (comment) (sliding board)      Prior Function Level of Independence: Needs assistance   Gait / Transfers Assistance Needed: Pt and daughter report mod I with slide board transfers PTA; requires assist for shower transfer; mod I with wheelchair propulsion  ADL's / Homemaking Assistance Needed: daughter reports assistance with bathing due to decreased ROM in L elbow   Comments: Sleeps in a recliner at home. W/c and sliding board for transfers, BSC with drop arm and cushion seat. Per daughter, pt's UEs are getting weaker and transfers are getting harder and harder     Hand Dominance   Dominant Hand: Right    Extremity/Trunk Assessment   Upper Extremity Assessment Upper Extremity Assessment: Defer to OT evaluation    Lower Extremity Assessment Lower Extremity Assessment: RLE deficits/detail;LLE deficits/detail RLE Deficits / Details: BKA, no prosthesis LLE Deficits / Details: + foot drop, minimal DF/PF observed and pt too TTP to assess PROM (pt uses LLE as balance point only during transfer, minimally WB). Able to perform SLR, quad set, knee flexion/extension in supine LLE: Unable to fully assess due to pain LLE Sensation:  (+ hypersensitivity)    Cervical / Trunk Assessment Cervical / Trunk Assessment: Kyphotic  Communication   Communication: No difficulties  Cognition Arousal/Alertness: Awake/alert Behavior During Therapy: WFL for tasks assessed/performed Overall Cognitive Status: Within Functional Limits for tasks assessed                                        General Comments      Exercises General Exercises - Lower Extremity Quad Sets: AAROM;Left;5 reps;Seated (in recliner, tactile cuing from PT at posterior knee)   Assessment/Plan    PT Assessment Patient needs continued PT services  PT Problem List Decreased strength;Decreased mobility;Decreased safety awareness;Decreased activity tolerance;Decreased balance;Decreased  knowledge of use of DME;Pain       PT Treatment Interventions DME instruction;Therapeutic activities;Therapeutic exercise;Patient/family education;Balance training;Functional mobility training;Neuromuscular re-education;Wheelchair mobility training    PT Goals (Current goals can be found in the Care Plan section)  Acute Rehab PT Goals Patient Stated Goal: per daughter - get pt stronger before home PT Goal Formulation: With patient/family Time For Goal Achievement: 05/21/20 Potential to Achieve Goals: Good    Frequency Min 2X/week   Barriers to discharge        Co-evaluation PT/OT/SLP Co-Evaluation/Treatment: Yes Reason for Co-Treatment: For patient/therapist safety;To address functional/ADL transfers PT goals addressed during session: Mobility/safety with mobility;Balance;Proper use of DME         AM-PAC PT "6 Clicks" Mobility  Outcome Measure Help needed turning from your back to your side while in a flat bed without using bedrails?: A Lot Help needed moving from lying on your back to sitting on the side of a flat bed without using bedrails?: A Lot Help needed moving to and from a bed to a chair (including a wheelchair)?: Total Help needed standing up from a chair using your arms (e.g., wheelchair or bedside chair)?: Total Help needed to walk in hospital room?: Total Help needed climbing 3-5 steps with a railing? : Total 6 Click Score: 8    End of Session Equipment Utilized  During Treatment: Gait belt Activity Tolerance: Patient limited by fatigue;Patient tolerated treatment well Patient left: in chair;with call bell/phone within reach;with family/visitor present Nurse Communication: Mobility status PT Visit Diagnosis: Muscle weakness (generalized) (M62.81);Pain Pain - Right/Left: Left Pain - part of body: Ankle and joints of foot;Leg    Time: 4327-6147 PT Time Calculation (min) (ACUTE ONLY): 19 min   Charges:   PT Evaluation $PT Eval Low Complexity: 1 Low          Arron Mcnaught E, PT Acute Rehabilitation Services Pager [phone]  Office [phone]  Franco Duley D Ezri Fanguy 05/07/2020, 10:30 AM

## 2020-05-07 NOTE — Evaluation (Signed)
Occupational Therapy Evaluation Patient Details Name: [name] MRN: [id number] DOB: [birth year] Today's Date: 05/07/2020    History of Present Illness Pt is a 86 y.o. 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 Presented to ED on 6/18 with acutely worsening left leg and foot pain, coolness and discoloration and found to have some inflow stenosis and low velocities in her bypass graft. Pt is s/p revascularization of LLE in 6/18.   Clinical Impression   This 86 y/o female presents with the above. PTA pt living with her daughter, using wheelchair and slideboard for transfers/mobility, receiving intermittent assist from daughter for ADL (including bathing/shower transfers). Pt currently presenting with overall weakness, decreased activity tolerance and decline in mobility status impacting her functional performance. Pt requiring two person assist for safe completion of bed mobility and for lateral/scoot transfer to drop arm recliner. She currently requires maxA for LB ADL, at least minA for UB ADL given UE weakness and decreased ROM. [name] present and supportive throughout session. Pt will benefit from continued acute OT services and currently recommend follow up therapy services in SNF setting to maximize her overall safety and independence with ADL and mobility as well as to decrease level of caregiver burden after return home.     Follow Up Recommendations  SNF;Supervision/Assistance - 24 hour    Equipment Recommendations  Other (comment) (to be further assessed )           Precautions / Restrictions Precautions Precautions: Fall Precaution Comments: chronic R BKA Restrictions Weight Bearing Restrictions: No      Mobility Bed Mobility Overal bed mobility: Needs Assistance Bed Mobility: Supine to Sit     Supine to sit: Mod assist;+2 for physical assistance;+2 for  safety/equipment;HOB elevated     General bed mobility comments: Mod assist +2 for LE management, trunk elevation with significant HOB elevation, placing LUE on contralateral handrail to assist in moving to EOB. Increased time and effort, with assist of bed pad to scoot to EOB.  Transfers Overall transfer level: Needs assistance Equipment used: Sliding board;2 person hand held assist Transfers: Lateral/Scoot Transfers          Lateral/Scoot Transfers: With slide board;Max assist;+2 physical assistance;+2 safety/equipment General transfer comment: Max +2 for slide board placement, scooting on slide board, trunk translation, and safe lift into drop arm recliner. Pt assisted by shifting body weight, hand placement. transfer towards R.    Balance Overall balance assessment: Needs assistance;History of Falls Sitting-balance support: Bilateral upper extremity supported Sitting balance-Leahy Scale: Fair Sitting balance - Comments: able to sit EOB ~1 minute without external assist, props on LUE as needed to rest. Posterior leaning upon initial sitting EOB requiring posterior assist to correct Postural control: Posterior lean   Standing balance-Leahy Scale: Zero Standing balance comment: slide board transfers only                           ADL either performed or assessed with clinical judgement   ADL Overall ADL's : Needs assistance/impaired Eating/Feeding: Set up;Sitting   Grooming: Set up;Supervision/safety;Sitting   Upper Body Bathing: Minimal assistance;Sitting   Lower Body Bathing: Maximal assistance;Sitting/lateral leans   Upper Body Dressing : Moderate assistance;Sitting   Lower Body Dressing: Maximal assistance;Total assistance;Sitting/lateral leans   Toilet Transfer: Maximal assistance;+2 for physical assistance;Requires drop arm;Transfer board Toilet Transfer Details (indicate cue type and reason): simulated  via transfer to drop arm recliner Toileting-  Clothing Manipulation and Hygiene: Maximal assistance;Sitting/lateral lean       Functional mobility during ADLs: Maximal assistance;+2 for physical assistance (lateral/scoot via transfer board) General ADL Comments: pt with weakness, decreased sitting balance and overall mobility status, pt also with very sensitive/painful LEs impacting her ability to perform ADL/mobility tasks     Vision         Perception     Praxis      Pertinent Vitals/Pain Pain Assessment: Faces Faces Pain Scale: Hurts even more Pain Location: LLE, in foot especially Pain Descriptors / Indicators: Sore;Discomfort;Guarding Pain Intervention(s): Monitored during session;Repositioned;Limited activity within patient's tolerance     Hand Dominance Right   Extremity/Trunk Assessment Upper Extremity Assessment Upper Extremity Assessment: RUE deficits/detail;Difficult to assess due to impaired cognition (pt with hx of L elbow replacement) RUE Deficits / Details: limited shoulder ROM at baseline, shoulder AROM grossly 0-85* with some compensatory movements noted  RUE Coordination: decreased gross motor   Lower Extremity Assessment Lower Extremity Assessment: Defer to PT evaluation RLE Deficits / Details: BKA, no prosthesis LLE Deficits / Details: + foot drop, minimal DF/PF observed and pt too TTP to assess PROM (pt uses LLE as balance point only during transfer, minimally WB). Able to perform SLR, quad set, knee flexion/extension in supine LLE: Unable to fully assess due to pain LLE Sensation:  (+ hypersensitivity)   Cervical / Trunk Assessment Cervical / Trunk Assessment: Kyphotic   Communication Communication Communication: No difficulties   Cognition Arousal/Alertness: Awake/alert Behavior During Therapy: WFL for tasks assessed/performed Overall Cognitive Status: Within Functional Limits for tasks assessed                                     General Comments  VSS, pt's daughter  present and very supportive/engaging during session     Exercises General Exercises - Lower Extremity Quad Sets: AAROM;Left;5 reps;Seated (in recliner, tactile cuing from PT at posterior knee)   Shoulder Instructions      Home Living Family/patient expects to be discharged to:: Private residence Living Arrangements: Children Available Help at Discharge: Family;Available 24 hours/day Type of Home: House Home Access: Ramped entrance     Home Layout: One level     Bathroom Shower/Tub: Occupational psychologist: Standard (with BSC with drop arm and cushion seat) Bathroom Accessibility: Yes How Accessible: Accessible via wheelchair Home Equipment: Wheelchair - manual;Shower seat;Bedside commode;Other (comment) (sliding board)          Prior Functioning/Environment Level of Independence: Needs assistance  Gait / Transfers Assistance Needed: Pt and daughter report mod I with slide board transfers PTA; requires assist for shower transfer; mod I with wheelchair propulsion ADL's / Homemaking Assistance Needed: daughter reports assistance with bathing due to decreased ROM in L elbow    Comments: Sleeps in a recliner at home. W/c and sliding board for transfers, BSC with drop arm and cushion seat. Per daughter, pt's UEs are getting weaker and transfers are getting harder and harder        OT Problem List: Decreased strength;Decreased range of motion;Decreased activity tolerance;Impaired balance (sitting and/or standing);Cardiopulmonary status limiting activity;Impaired UE functional use;Pain;Impaired sensation;Decreased knowledge of use of DME or AE      OT Treatment/Interventions: Self-care/ADL training;Therapeutic exercise;Energy conservation;DME and/or AE instruction;Therapeutic activities;Patient/family education;Balance training    OT Goals(Current goals can be found in the care plan section) Acute  Rehab OT Goals Patient Stated Goal: per daughter - get pt stronger before  home OT Goal Formulation: With patient Time For Goal Achievement: 05/21/20 Potential to Achieve Goals: Good  OT Frequency: Min 2X/week   Barriers to D/C:            Co-evaluation PT/OT/SLP Co-Evaluation/Treatment: Yes Reason for Co-Treatment: For patient/therapist safety;To address functional/ADL transfers PT goals addressed during session: Mobility/safety with mobility;Balance;Proper use of DME OT goals addressed during session: ADL's and self-care;Strengthening/ROM      AM-PAC OT "6 Clicks" Daily Activity     Outcome Measure Help from another person eating meals?: A Little Help from another person taking care of personal grooming?: A Little Help from another person toileting, which includes using toliet, bedpan, or urinal?: A Lot Help from another person bathing (including washing, rinsing, drying)?: A Lot Help from another person to put on and taking off regular upper body clothing?: A Lot Help from another person to put on and taking off regular lower body clothing?: A Lot 6 Click Score: 14   End of Session Equipment Utilized During Treatment: Gait belt Nurse Communication: Mobility status  Activity Tolerance: Patient tolerated treatment well Patient left: in chair;with call bell/phone within reach;with family/visitor present  OT Visit Diagnosis: Other abnormalities of gait and mobility (R26.89);Muscle weakness (generalized) (M62.81)                Time: 0258-5277 OT Time Calculation (min): 29 min Charges:  OT General Charges $OT Visit: 1 Visit OT Evaluation $OT Eval Moderate Complexity: [place], OT Acute Rehabilitation Services Pager [phone] Office [phone]   Raymondo Band 05/07/2020, 10:48 AM

## 2020-05-07 NOTE — NC FL2 (Signed)
[place] LEVEL OF CARE SCREENING TOOL     IDENTIFICATION  Patient Name: [name] Birthdate: 08-04-1928 Sex: female Admission Date (Current Location): 05/04/2020  [facility] and [place] Number:  Herbalist and Address:  [facility] [place] [address], [place], [place] [address]      Provider Number:    Attending Physician Name and Address:  Serafina Mitchell, MD  Relative Name and Phone Number:       Current Level of Care: Hospital Recommended Level of Care: [place] Prior Approval Number:    Date Approved/Denied:   PASRR Number: [id number]  Discharge Plan: SNF    Current Diagnoses: Patient Active Problem List   Diagnosis Date Noted  . Thrombosis 05/04/2020  . Secondary hypercoagulable state ([place]) 01/09/2020  . Critical lower limb ischemia 12/28/2019  . Plantar fasciitis of left foot 06/10/2017  . Idiopathic chronic venous hypertension of both lower extremities with inflammation 05/21/2017  . Foot drop, left 05/21/2017  . Decubitus ulcer of sacral region, unstageable ([place])   . Deep tissue injury   . Hypokalemia   . Hypoalbuminemia due to protein-calorie malnutrition ([place])   . Debilitated 01/21/2017  . Neuropathic pain   . Post-operative pain   . Slow transit constipation   . Debility   . Ischemic leg   . PAF (paroxysmal atrial fibrillation) ([place])   . Anemia of chronic disease   . Chronic pain syndrome   . Fibromyalgia   . Stage 3 chronic kidney disease   . Benign essential HTN   . Abnormal urinalysis   . PVD (peripheral vascular disease) ([place]) 01/13/2017  . Post-op pain 01/03/2017  . Phantom limb pain () 01/03/2017  . S/P unilateral BKA (below knee amputation), right ([place]) 12/30/2016  . Acute on chronic combined systolic and diastolic CHF (congestive heart failure) ([place]) 12/22/2016  . Anemia 12/22/2016  . Palliative care encounter   . Lower extremity pain, right   . Acute GI  bleeding 10/23/2016  . Paroxysmal atrial fibrillation ([place]) 10/03/2016  . 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 PAD (peripheral artery disease) ([place]) 09/05/2016  . Groin pain 04/02/2015  . Aftercare following surgery of the circulatory system, [place] 12/13/2013  . Shingles 10/26/2013  . Anxiety 10/26/2013  . Acute blood loss anemia 09/05/2013  . Elbow fracture, left 09/05/2013  . Left acetabular fracture ([place]) 09/05/2013  . Ankle fracture, left 09/05/2013  . HTN (hypertension) 09/03/2013  . HLD (hyperlipidemia) 09/03/2013  . Peripheral vascular disease, unspecified ([place]) 05/10/2012  . Chronic total occlusion of artery of the extremities ([facility]) 01/19/2012    Orientation RESPIRATION BLADDER Height & Weight     Self, Time, Situation, Place  Normal Continent, External catheter Weight: 138 lb 3.2 oz (62.7 kg) Height:  5\' 2"  (157.5 cm)  BEHAVIORAL SYMPTOMS/MOOD NEUROLOGICAL BOWEL NUTRITION STATUS      Continent Diet (please see discharge summary)  AMBULATORY STATUS COMMUNICATION OF NEEDS Skin     Verbally Surgical wounds (closed incision, left leg, closed incision, right groin, closed incision, right groin, liquid skin adhesive)                       Personal Care Assistance Level of Assistance  Bathing, Dressing, Feeding Bathing Assistance: Maximum assistance Feeding assistance: Limited assistance Dressing Assistance: Maximum assistance     Functional Limitations Info  Sight, Hearing, Speech Sight Info: Adequate Hearing  Info: Adequate Speech Info: Adequate    SPECIAL CARE FACTORS FREQUENCY  OT (By licensed OT), Bowel and bladder program       OT Frequency: 5x per week Bowel and Bladder Program Frequency: 5xper week          Contractures Contractures Info: Not present    Additional Factors Info  Code Status, Allergies Code Status Info: FULL Allergies Info: Motrin,statins,eliquis,morphine and  related,promethazine,sulfa antibiotics           Current Medications (05/07/2020):  This is the current hospital active medication list Current Facility-Administered Medications  Medication Dose Route Frequency Provider Last Rate Last Admin  . 0.9 %  sodium chloride infusion  500 mL Intravenous Once PRN Dagoberto Ligas, PA-C      . acetaminophen (TYLENOL) tablet 325-650 mg  325-650 mg Oral Q4H PRN Dagoberto Ligas, PA-C   650 mg at 05/07/20 1152   Or  . acetaminophen (TYLENOL) suppository 325-650 mg  325-650 mg Rectal Q4H PRN Dagoberto Ligas, PA-C      . alum & mag hydroxide-simeth (MAALOX/MYLANTA) 200-200-20 MG/5ML suspension 15-30 mL  15-30 mL Oral Q2H PRN Dagoberto Ligas, PA-C      . amiodarone (PACERONE) tablet 200 mg  200 mg Oral Daily Dagoberto Ligas, PA-C   200 mg at 05/07/20 0917  . bisacodyl (DULCOLAX) suppository 10 mg  10 mg Rectal Daily PRN Serafina Mitchell, MD   10 mg at 05/07/20 1517  . clopidogrel (PLAVIX) tablet 75 mg  75 mg Oral Daily Dagoberto Ligas, PA-C   75 mg at 05/07/20 0916  . diazepam (VALIUM) tablet 2.5 mg  2.5 mg Oral QHS PRN Dagoberto Ligas, PA-C   2.5 mg at 05/05/20 2115  . docusate sodium (COLACE) capsule 100 mg  100 mg Oral Daily Dagoberto Ligas, PA-C   100 mg at 05/07/20 7035  . gabapentin (NEURONTIN) capsule 300 mg  300 mg Oral TID Dagoberto Ligas, PA-C   300 mg at 05/07/20 1517  . gabapentin (NEURONTIN) capsule 300 mg  300 mg Oral QHS PRN Serafina Mitchell, MD      . guaiFENesin-dextromethorphan (ROBITUSSIN DM) 100-10 MG/5ML syrup 15 mL  15 mL Oral Q4H PRN Dagoberto Ligas, PA-C      . heparin injection 5,000 Units  5,000 Units Subcutaneous Q8H Rhyne, Samantha J, PA-C   5,000 Units at 05/07/20 1333  . hydrALAZINE (APRESOLINE) injection 5 mg  5 mg Intravenous Q20 Min PRN Dagoberto Ligas, PA-C      . irbesartan (AVAPRO) tablet 75 mg  75 mg Oral Daily Serafina Mitchell, MD   75 mg at 05/07/20 0093   And  . hydrochlorothiazide 10 mg/mL oral suspension  6.25 mg  6.25 mg Oral Daily Serafina Mitchell, MD   6.25 mg at 05/07/20 8182  . HYDROmorphone (DILAUDID) injection 0.5 mg  0.5 mg Intravenous Q2H PRN Dagoberto Ligas, PA-C      . labetalol (NORMODYNE) injection 10 mg  10 mg Intravenous Q10 min PRN Dagoberto Ligas, PA-C      . loperamide (IMODIUM) capsule 2 mg  2 mg Oral PRN Dagoberto Ligas, PA-C      . magnesium sulfate IVPB 2 g 50 mL  2 g Intravenous Daily PRN Dagoberto Ligas, PA-C      . metoprolol tartrate (LOPRESSOR) injection 2-5 mg  2-5 mg Intravenous Q2H PRN Dagoberto Ligas, PA-C      . metoprolol tartrate (LOPRESSOR) tablet 25 mg  25 mg Oral QHS Dagoberto Ligas, PA-C   25 mg at  05/06/20 2145  . ondansetron (ZOFRAN) injection 4 mg  4 mg Intravenous Q6H PRN Dagoberto Ligas, PA-C      . oxyCODONE-acetaminophen (PERCOCET/ROXICET) 5-325 MG per tablet 1-2 tablet  1-2 tablet Oral Q4H PRN Dagoberto Ligas, PA-C   2 tablet at 05/06/20 1742  . pantoprazole (PROTONIX) EC tablet 40 mg  40 mg Oral Daily Dagoberto Ligas, PA-C   40 mg at 05/07/20 0916  . phenol (CHLORASEPTIC) mouth spray 1 spray  1 spray Mouth/Throat PRN Dagoberto Ligas, PA-C      . polyethylene glycol (MIRALAX / GLYCOLAX) packet 17 g  17 g Oral Daily Serafina Mitchell, MD   17 g at 05/07/20 1421  . potassium chloride SA (KLOR-CON) CR tablet 20-40 mEq  20-40 mEq Oral Daily PRN Dagoberto Ligas, PA-C         Discharge Medications: Please see discharge summary for a list of discharge medications.  Relevant Imaging Results:  Relevant Lab Results:   Additional Information [id number]  Vinie Sill, LCSWA

## 2020-05-07 NOTE — Progress Notes (Addendum)
  Progress Note    05/07/2020 7:12 AM 3 Days Post-Op  Subjective:  Says she has some pain in the left leg but better than when she came in.    Afebrile HR 60's-80's NSR 175'Z-025'E systolic 52% RA  Vitals:   05/07/20 0017 05/07/20 0415  BP: (!) 115/46 (!) 130/51  Pulse: 69 75  Resp: 15 15  Temp: 97.9 F (36.6 C) 98.2 F (36.8 C)  SpO2: 96% 95%    Physical Exam: Cardiac:  regular Lungs:  Non labored Incisions:  Clean and dry Extremities:  Brisk left doppler signal DP; left foot warm.   CBC    Component Value Date/Time   WBC 8.0 05/07/2020 0249   RBC 2.52 (L) 05/07/2020 0249   HGB 7.7 (L) 05/07/2020 0249   HCT 24.3 (L) 05/07/2020 0249   PLT 162 05/07/2020 0249   MCV 96.4 05/07/2020 0249   MCH 30.6 05/07/2020 0249   MCHC 31.7 05/07/2020 0249   RDW 15.7 (H) 05/07/2020 0249   LYMPHSABS 3.0 05/04/2020 1011   MONOABS 0.8 05/04/2020 1011   EOSABS 0.3 05/04/2020 1011   BASOSABS 0.1 05/04/2020 1011    BMET    Component Value Date/Time   NA 136 05/05/2020 0809   K 4.5 05/05/2020 0809   CL 107 05/05/2020 0809   CO2 23 05/05/2020 0809   GLUCOSE 110 (H) 05/05/2020 0809   BUN 31 (H) 05/05/2020 0809   CREATININE 1.60 (H) 05/05/2020 0809   CALCIUM 8.2 (L) 05/05/2020 0809   GFRNONAA 28 (L) 05/05/2020 0809   GFRAA 33 (L) 05/05/2020 0809    INR    Component Value Date/Time   INR 1.0 05/04/2020 1011     Intake/Output Summary (Last 24 hours) at 05/07/2020 7782 Last data filed at 05/07/2020 4235 Gross per 24 hour  Intake 160 ml  Output 850 ml  Net -690 ml     Assessment:  86 y.o. female is s/p:   #1: Redo exposure of left femoral anterior tibial bypass graft through the below-knee incision #2: Thrombectomy of left femoral anterior tibial bypass graft and anterior tibial artery #3: Ultrasound-guided access, right femoral artery #4: Left lower extremity angiogram #5: Stent within the proximal aspect of left femoral tibial bypass graft and profundofemoral  artery (Viabahn 6x50) #6: Angioplasty left anterior tibial artery #7: Intra-arterial administration of nitroglycerin #8: Closure device (Pro-glide)  3 Days Post-Op  Plan: -pt continues to have brisk left doppler signal.   -pt was supposed to dc home yesterday, however, pt states she was sleepy all day and did not go.  Daughter in room this am and states she and [name] discussed possible dc to SNF.  Case management order placed yesterday for this. -acute blood loss anemia improved this am slightly -will order to elevate legs when up in chair.  -DVT prophylaxis:  Will start sq heparin today   Leontine Locket, PA-C [facility] [phone] 05/07/2020 7:12 AM  I have independently interviewed and examined patient and agree with PA assessment and plan above.  PT recommending SNF.  Foot is warm and well perfused on the left.  Lizzeth Meder C. Donzetta Matters, MD [facility] [place] Office: [phone] Pager: [phone]

## 2020-05-07 NOTE — TOC Initial Note (Addendum)
Transition of Care [facility]) - Initial/Assessment Note    Patient Details  Name: [name] MRN: [id number] Date of Birth: [birth year]  Transition of Care [facility]) CM/SW Contact:    Vinie Sill, [place] Phone Number: 05/07/2020, 3:22 PM  Clinical Narrative:                  CSW visit with patient and her daughter,Cindy at bedside. CSW introduced self and explained role. [name] states her mother lives in the home with her. [name] is hopeful that patient can return to [facility] , however she has been exploring long term care. CSW discussed PT recommendation of short term rehab at [facility]. Patient's daughter was in agreement with recommendations and  SNF placement. [name] shared, patient has been to a few rehabs before and was not satisfied with the facility. She was only agreeable to a select few. CSW explained the SNF process. CSW given permission to send referral. Family states no questions or concerns at this time.  CSW will provide bed offers once available.   Thurmond Butts, MSW, [place] Clinical Social Worker   Expected Discharge Plan: Acute to Acute Transfer Barriers to Discharge: Continued Medical Work up, SNF Pending bed offer   Patient Goals and CMS Choice     Choice offered to / list presented to : Adult Children  Expected Discharge Plan and Services Expected Discharge Plan: Acute to Acute Transfer In-house Referral: Clinical Social Work   Post Acute Care Choice: [place] Living arrangements for the past 2 months: [place] Expected Discharge Date: 05/06/20                                    Prior Living Arrangements/Services Living arrangements for the past 2 months: Single Family Home Lives with:: Self, Adult Children Patient language and need for interpreter reviewed:: No        Need for Family Participation in Patient Care: Yes (Comment) Care giver support system in place?: Yes (comment)   Criminal Activity/Legal Involvement Pertinent  to Current Situation/Hospitalization: No - Comment as needed  Activities of Daily Living Home Assistive Devices/Equipment: Wheelchair ADL Screening (condition at time of admission) Patient's cognitive ability adequate to safely complete daily activities?: Yes Is the patient deaf or have difficulty hearing?: No Does the patient have difficulty seeing, even when wearing glasses/contacts?: No Does the patient have difficulty concentrating, remembering, or making decisions?: No Patient able to express need for assistance with ADLs?: Yes Does the patient have difficulty dressing or bathing?: No Independently performs ADLs?: Yes (appropriate for developmental age) Does the patient have difficulty walking or climbing stairs?: No Weakness of Legs: Both Weakness of Arms/Hands: Both  Permission Sought/Granted Permission sought to share information with : Family Supports, Customer service manager, Case Optician, dispensing granted to share information with : Yes, Verbal Permission Granted  Share Information with NAME: Toma Erichsen  Permission granted to share info w AGENCY: SNFs  Permission granted to share info w Relationship: daughter  Permission granted to share info w Contact Information: [phone]  Emotional Assessment Appearance:: Appears stated age   Affect (typically observed): Pleasant Orientation: : Oriented to Self, Oriented to Place, Oriented to  Time, Oriented to Situation Alcohol / Substance Use: Not Applicable Psych Involvement: No (comment)  Admission diagnosis:  Arterial occlusion, lower extremity ([place]) [I70.209] Thrombosis [I82.90] Ischemic leg [I99.8] Patient Active Problem List   Diagnosis Date Noted  . Thrombosis  05/04/2020  . Secondary hypercoagulable state ([place]) 01/09/2020  . Critical lower limb ischemia 12/28/2019  . Plantar fasciitis of left foot 06/10/2017  . Idiopathic chronic venous hypertension of both lower extremities with inflammation 05/21/2017   . Foot drop, left 05/21/2017  . Decubitus ulcer of sacral region, unstageable ([place])   . Deep tissue injury   . Hypokalemia   . Hypoalbuminemia due to protein-calorie malnutrition ([place])   . Debilitated 01/21/2017  . Neuropathic pain   . Post-operative pain   . Slow transit constipation   . Debility   . Ischemic leg   . PAF (paroxysmal atrial fibrillation) ([place])   . Anemia of chronic disease   . Chronic pain syndrome   . Fibromyalgia   . Stage 3 chronic kidney disease   . Benign essential HTN   . Abnormal urinalysis   . PVD (peripheral vascular disease) ([place]) 01/13/2017  . Post-op pain 01/03/2017  . Phantom limb pain ([place]) 01/03/2017  . S/P unilateral BKA (below knee amputation), right ([place]) 12/30/2016  . Acute on chronic combined systolic and diastolic CHF (congestive heart failure) ([place]) 12/22/2016  . Anemia 12/22/2016  . Palliative care encounter   . Lower extremity pain, right   . Acute GI bleeding 10/23/2016  . Paroxysmal atrial fibrillation ([place]) 10/03/2016  . 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 PAD (peripheral artery disease) ([place]) 09/05/2016  . Groin pain 04/02/2015  . Aftercare following surgery of the circulatory system, [place] 12/13/2013  . Shingles 10/26/2013  . Anxiety 10/26/2013  . Acute blood loss anemia 09/05/2013  . Elbow fracture, left 09/05/2013  . Left acetabular fracture ([place]) 09/05/2013  . Ankle fracture, left 09/05/2013  . HTN (hypertension) 09/03/2013  . HLD (hyperlipidemia) 09/03/2013  . Peripheral vascular disease, unspecified ([place]) 05/10/2012  . Chronic total occlusion of artery of the extremities (HCC) 01/19/2012   PCP:  Josetta Huddle, MD Pharmacy:   [place], [place] [address] [address] Phone: [phone] Fax: [phone]     Social Determinants of Health (SDOH) Interventions    Readmission Risk  Interventions No flowsheet data found.

## 2020-05-08 LAB — CBC
HCT: 27 % — ABNORMAL LOW (ref 36.0–46.0)
Hemoglobin: 8.5 g/dL — ABNORMAL LOW (ref 12.0–15.0)
MCH: 30.2 pg (ref 26.0–34.0)
MCHC: 31.5 g/dL (ref 30.0–36.0)
MCV: 96.1 fL (ref 80.0–100.0)
Platelets: 210 10*3/uL (ref 150–400)
RBC: 2.81 MIL/uL — ABNORMAL LOW (ref 3.87–5.11)
RDW: 15.4 % (ref 11.5–15.5)
WBC: 13.8 10*3/uL — ABNORMAL HIGH (ref 4.0–10.5)
nRBC: 0 % (ref 0.0–0.2)

## 2020-05-08 LAB — SARS CORONAVIRUS 2 (TAT 6-24 HRS): SARS Coronavirus 2: NEGATIVE

## 2020-05-08 NOTE — TOC Progression Note (Signed)
Transition of Care [facility]) - Progression Note    Patient Details  Name: [name] MRN: [id number] Date of Birth: 04/17/1928  Transition of Care [facility]) CM/SW [place], [place] Phone Number: 05/08/2020, 4:15 PM  Clinical Narrative:     Patient has no bed offers. Patient's daughter gave permission to send referral to 3 SNFs - [place] and [facility] has no availability.  [place] - remains pending- faxed additional clinicals for review -   CSW called patient's daughter, left voice message to return call.  Thurmond Butts, MSW, [place] Clinical Social Worker    Expected Discharge Plan: Acute to Acute Transfer Barriers to Discharge: Continued Medical Work up, SNF Pending bed offer  Expected Discharge Plan and Services Expected Discharge Plan: Acute to Acute Transfer In-house Referral: Clinical Social Work   Post Acute Care Choice: [place] arrangements for the past 2 months: Single Family Home Expected Discharge Date: 05/06/20                                     Social Determinants of Health (SDOH) Interventions    Readmission Risk Interventions No flowsheet data found.

## 2020-05-08 NOTE — Progress Notes (Addendum)
  Progress Note    05/08/2020 7:44 AM 4 Days Post-Op  Subjective:  Some mild left leg pain but no major complaints. Asking when she will leave and what SNF she is going to   Vitals:   05/08/20 0300 05/08/20 0428  BP:  (!) 138/59  Pulse: 82 89  Resp: (!) 24 20  Temp:  (!) 100.4 F (38 C)  SpO2: 92% 93%   Physical Exam: Cardiac:  regular Lungs: non labored Incisions:  Left leg incision clean, dry and intact Extremities:  Left leg warm and well perfused. Palpable DP/ AT pulses Abdomen: soft Neurologic: alert and oriented  CBC    Component Value Date/Time   WBC 13.8 (H) 05/08/2020 0359   RBC 2.81 (L) 05/08/2020 0359   HGB 8.5 (L) 05/08/2020 0359   HCT 27.0 (L) 05/08/2020 0359   PLT 210 05/08/2020 0359   MCV 96.1 05/08/2020 0359   MCH 30.2 05/08/2020 0359   MCHC 31.5 05/08/2020 0359   RDW 15.4 05/08/2020 0359   LYMPHSABS 3.0 05/04/2020 1011   MONOABS 0.8 05/04/2020 1011   EOSABS 0.3 05/04/2020 1011   BASOSABS 0.1 05/04/2020 1011    BMET    Component Value Date/Time   NA 136 05/05/2020 0809   K 4.5 05/05/2020 0809   CL 107 05/05/2020 0809   CO2 23 05/05/2020 0809   GLUCOSE 110 (H) 05/05/2020 0809   BUN 31 (H) 05/05/2020 0809   CREATININE 1.60 (H) 05/05/2020 0809   CALCIUM 8.2 (L) 05/05/2020 0809   GFRNONAA 28 (L) 05/05/2020 0809   GFRAA 33 (L) 05/05/2020 0809    INR    Component Value Date/Time   INR 1.0 05/04/2020 1011     Intake/Output Summary (Last 24 hours) at 05/08/2020 0744 Last data filed at 05/08/2020 0005 Gross per 24 hour  Intake 600 ml  Output 400 ml  Net 200 ml     Assessment/Plan:  86 y.o. female is s/p #1: Redo exposure of left femoral anterior tibial bypass graft through the below-knee incision #2: Thrombectomy of left femoral anterior tibial bypass graft and anterior tibial artery #3: Ultrasound-guided access, right femoral artery #4: Left lower extremity angiogram #5: Stent within the proximal aspect of left femoral tibial  bypass graft and profundofemoral artery (Viabahn 6x50) #6: Angioplasty left anterior tibial artery #7: Intra-arterial administration of nitroglycerin #8: Closure device (Pro-glide)   4 Days Post-Op  Left leg well perfused. Palpable DP/AT pulses. Left foot warm. Hemodynamically stable at her baseline. Pain well controlled. She is pending SNF bed. Possible discharge today if bed is available  DVT prophylaxis:  Sq heparin   Karoline Caldwell, PA-C [facility] [phone] 05/08/2020 7:44 AM    I have interviewed and examined patient with PA and agree with assessment and plan above.   Lilo Wallington C. Donzetta Matters, MD [facility] [place] Office: [phone] Pager: [phone]

## 2020-05-08 NOTE — TOC Progression Note (Signed)
Transition of Care [facility]) - Progression Note    Patient Details  Name: NYLIA GAVINA MRN: [id number] Date of Birth: July 04, 1928  Transition of Care [facility]) CM/SW [place], [place] Phone Number: 05/08/2020, 5:34 PM  Clinical Narrative:     Sent SNF referral to [facility] and faxed additional clinicals to facility.- waiting on response.  Thurmond Butts, MSW, [place] Clinical Social Worker   Expected Discharge Plan: Acute to Acute Transfer Barriers to Discharge: Continued Medical Work up, SNF Pending bed offer  Expected Discharge Plan and Services Expected Discharge Plan: Acute to Acute Transfer In-house Referral: Clinical Social Work   Post Acute Care Choice: [place] arrangements for the past 2 months: Single Family Home Expected Discharge Date: 05/06/20                                     Social Determinants of Health (SDOH) Interventions    Readmission Risk Interventions No flowsheet data found.

## 2020-05-08 NOTE — TOC Progression Note (Signed)
Transition of Care [facility]) - Progression Note    Patient Details  Name: KOHANA AMBLE MRN: [id number] Date of Birth: 1928/08/01  Transition of Care [facility]) CM/SW [place], [place] Phone Number: 05/08/2020, 4:43 PM  Clinical Narrative:     Received phone call from patient's daughter, she requested CSW send referral to [facility]. CSW advised per MD note patient is medically ready for discharge and plan need to be in place by tomorrow. Patient's daughter states understands.  Thurmond Butts, MSW, [place] Clinical Social Worker   Expected Discharge Plan: Acute to Acute Transfer Barriers to Discharge: Continued Medical Work up, SNF Pending bed offer  Expected Discharge Plan and Services Expected Discharge Plan: Acute to Acute Transfer In-house Referral: Clinical Social Work   Post Acute Care Choice: [place] arrangements for the past 2 months: Single Family Home Expected Discharge Date: 05/06/20                                     Social Determinants of Health (SDOH) Interventions    Readmission Risk Interventions No flowsheet data found.

## 2020-05-09 LAB — CBC
HCT: 23.7 % — ABNORMAL LOW (ref 36.0–46.0)
Hemoglobin: 7.5 g/dL — ABNORMAL LOW (ref 12.0–15.0)
MCH: 30.6 pg (ref 26.0–34.0)
MCHC: 31.6 g/dL (ref 30.0–36.0)
MCV: 96.7 fL (ref 80.0–100.0)
Platelets: 218 10*3/uL (ref 150–400)
RBC: 2.45 MIL/uL — ABNORMAL LOW (ref 3.87–5.11)
RDW: 15.8 % — ABNORMAL HIGH (ref 11.5–15.5)
WBC: 12.2 10*3/uL — ABNORMAL HIGH (ref 4.0–10.5)
nRBC: 0.2 % (ref 0.0–0.2)

## 2020-05-09 MED ORDER — LOPERAMIDE HCL 1 MG/7.5ML PO SUSP
2.0000 mg | ORAL | Status: DC | PRN
  Filled 2020-05-09: qty 15

## 2020-05-09 NOTE — TOC Transition Note (Signed)
Transition of Care [facility]) - CM/SW Discharge Note [name] RN, BSN Transitions of Care Unit 4E- RN Case Manager [phone]   Patient Details  Name: [name] MRN: [id number] Date of Birth: 12/04/1927  Transition of Care [facility]) CM/SW Contact:  Dawayne Lyllian, RN Phone Number: 05/09/2020, 2:51 PM   Clinical Narrative:    Pt stable for transition, informed by CSW that daughter has decided to take pt home as she does not have a bed offer at the selected SNFs she wanted. Noted pt has [place] sent to [place] with [facility] to see if pt eligible for [facility] [place]- pt is eligible. Spoke with pt and daughter at bedside regarding transition plans and [place] home first program along with [facility] services. Pt and daughter open and agreeable to Home First Program- daughter states she believes that aide services could start tomorrow. Family to transport home. Pt has all needed DME only request is a new slide board- will have [employer] bring a 24 in board to room prior to discharge.  Call made to [facility] with Alvis Lemmings to give referral to Home First program- after review- Alvis Lemmings can accept pt for [facility] and Home First needs. Tommi Rumps will reach out to daughter to discuss Home First and aide needs/arrangements.   Call made to [place]. With vascular for [place] orders- HHRN/PT   Final next level of care: Home w Home Health Services Barriers to Discharge: No Barriers Identified   Patient Goals and CMS Choice Patient states their goals for this hospitalization and ongoing recovery are:: go home CMS Medicare.gov Compare Post Acute Care list provided to:: Patient Choice offered to / list presented to : Patient, Adult Children  Discharge Placement               Home with [facility]        Discharge Plan and Services In-house Referral: Clinical Social Work Discharge Planning Services: CM Consult Post Acute Care Choice: Home Health          DME Arranged: Other see comment (slide board) DME Agency:  [facility] Date DME Agency Contacted: 05/09/20 Time DME Agency Contacted: [id number] Representative spoke with at DME Agency: [place]: [place] Date [place]: 05/09/20 Time [place]: 1415 Representative spoke with at [place]: [place] ([place]) Interventions     Readmission Risk Interventions Readmission Risk Prevention Plan 05/09/2020  Transportation Screening Complete  PCP or Specialist Appt within 5-7 Days Complete  Home Care Screening Complete  Medication Review (RN CM) Complete  Some recent data might be hidden

## 2020-05-09 NOTE — Progress Notes (Addendum)
  Progress Note    05/09/2020 7:41 AM 5 Days Post-Op  Subjective:  No complaints   Vitals:   05/08/20 2359 05/09/20 0444  BP: (!) 124/57 (!) 126/50  Pulse: 80 75  Resp: (!) 23 (!) 21  Temp: 98.8 F (37.1 C) 98.4 F (36.9 C)  SpO2: 93% 95%   Physical Exam: Lungs:  Non labored Incisions:  LLE incision soft; c/d/i Extremities:  Palpable L DP pulse Neurologic: A&O  CBC    Component Value Date/Time   WBC 12.2 (H) 05/09/2020 0239   RBC 2.45 (L) 05/09/2020 0239   HGB 7.5 (L) 05/09/2020 0239   HCT 23.7 (L) 05/09/2020 0239   PLT 218 05/09/2020 0239   MCV 96.7 05/09/2020 0239   MCH 30.6 05/09/2020 0239   MCHC 31.6 05/09/2020 0239   RDW 15.8 (H) 05/09/2020 0239   LYMPHSABS 3.0 05/04/2020 1011   MONOABS 0.8 05/04/2020 1011   EOSABS 0.3 05/04/2020 1011   BASOSABS 0.1 05/04/2020 1011    BMET    Component Value Date/Time   NA 136 05/05/2020 0809   K 4.5 05/05/2020 0809   CL 107 05/05/2020 0809   CO2 23 05/05/2020 0809   GLUCOSE 110 (H) 05/05/2020 0809   BUN 31 (H) 05/05/2020 0809   CREATININE 1.60 (H) 05/05/2020 0809   CALCIUM 8.2 (L) 05/05/2020 0809   GFRNONAA 28 (L) 05/05/2020 0809   GFRAA 33 (L) 05/05/2020 0809    INR    Component Value Date/Time   INR 1.0 05/04/2020 1011     Intake/Output Summary (Last 24 hours) at 05/09/2020 0741 Last data filed at 05/09/2020 0600 Gross per 24 hour  Intake 1240 ml  Output 600 ml  Net 640 ml     Assessment/Plan:  86 y.o. female is s/p LLE thrombectomy 5 Days Post-Op   L foot well perfused with palpable DP pulse Ready for discharge when SNF bed approved   Dagoberto Ligas, PA-C [facility] [phone] 05/09/2020 7:41 AM  I have independently interviewed and examined patient and agree with PA assessment and plan above.   Iretha Kirley C. Donzetta Matters, MD [facility] of [place] Office: [phone] Pager: [phone]

## 2020-05-09 NOTE — Consult Note (Signed)
   [facility] Inpatient Consult   05/09/2020  [name] 11/27/1927 [id number]   [name]   Patient screened to check if potential [place] [facility] benefits and service needs.  Review of patient's medical record reveals patient is in the Medicare ACO  Primary Care Provider is  Josetta Huddle, MD with [facility] this provider is listed to provide the transition of care [TOC] for post hospital follow up.   Plan: Follow up with inpatient [facility] team referral was received and will follow up if [place] is accepted.  Continue to follow progress and disposition to assess for post hospital care management needs.    For questions contact:   Natividad Brood, RN BSN [facility] Liaison  [phone] business mobile phone Toll free office [phone]  Fax number: [phone] [email or web address]

## 2020-05-09 NOTE — Care Management Important Message (Signed)
Important Message  Patient Details  Name: [name] MRN: [id number] Date of Birth: 1928-08-24   Medicare Important Message Given:  Yes     Zethan Alfieri [address] 05/09/2020, 3:46 PM

## 2020-05-09 NOTE — Progress Notes (Signed)
Discharge:   Patient discharge home with daughter.  Discharge summary reviewed.  No changes in medications.  New sliding board delivered at 1510.  Patient assisted to private vehicle by daughter in private wheelchair.

## 2020-05-09 NOTE — Progress Notes (Addendum)
Plan of care reviewed. Pt's been progressing. She appeared alert and oriented x 4. Pt was drowsy in day time per RN day shift reported. She's hemodynamically stable, afebrile, NSR on monitor, BP within normal limits, SPO2 93-94% on room air.  Right groin no hematoma. Left leg surgical wound dry and clean. Denied pain. No acute distress noted. We will continue to monitor.  Kennyth Lose, RN

## 2020-05-09 NOTE — TOC Progression Note (Signed)
Transition of Care [facility]) - Progression Note    Patient Details  Name: [name] MRN: [id number] Date of Birth: [birth year]  Transition of Care [facility]) CM/SW [place], [place] Phone Number: 05/09/2020, 12:18 PM  Clinical Narrative:     CSW informed patient's daughter,Cindy  [facility] has no availability and [place] declined bed offer.   Expected Discharge Plan: Acute to Acute Transfer Barriers to Discharge: Continued Medical Work up, SNF Pending bed offer  Expected Discharge Plan and Services Expected Discharge Plan: Acute to Acute Transfer In-house Referral: Clinical Social Work   Post Acute Care Choice: [place] Living arrangements for the past 2 months: Single Family Home Expected Discharge Date: 05/06/20                                     Social Determinants of Health (SDOH) Interventions    Readmission Risk Interventions No flowsheet data found.

## 2020-05-11 DIAGNOSIS — I509 Heart failure, unspecified: Secondary | ICD-10-CM | POA: Diagnosis not present

## 2020-05-11 DIAGNOSIS — Z7902 Long term (current) use of antithrombotics/antiplatelets: Secondary | ICD-10-CM | POA: Diagnosis not present

## 2020-05-11 DIAGNOSIS — H54413A Blindness right eye category 3, normal vision left eye: Secondary | ICD-10-CM | POA: Diagnosis not present

## 2020-05-11 DIAGNOSIS — I87323 Chronic venous hypertension (idiopathic) with inflammation of bilateral lower extremity: Secondary | ICD-10-CM | POA: Diagnosis not present

## 2020-05-11 DIAGNOSIS — I130 Hypertensive heart and chronic kidney disease with heart failure and stage 1 through stage 4 chronic kidney disease, or unspecified chronic kidney disease: Secondary | ICD-10-CM | POA: Diagnosis not present

## 2020-05-11 DIAGNOSIS — M792 Neuralgia and neuritis, unspecified: Secondary | ICD-10-CM | POA: Diagnosis not present

## 2020-05-11 DIAGNOSIS — M797 Fibromyalgia: Secondary | ICD-10-CM | POA: Diagnosis not present

## 2020-05-11 DIAGNOSIS — G894 Chronic pain syndrome: Secondary | ICD-10-CM | POA: Diagnosis not present

## 2020-05-11 DIAGNOSIS — M19042 Primary osteoarthritis, left hand: Secondary | ICD-10-CM | POA: Diagnosis not present

## 2020-05-11 DIAGNOSIS — Z89511 Acquired absence of right leg below knee: Secondary | ICD-10-CM | POA: Diagnosis not present

## 2020-05-11 DIAGNOSIS — Z9181 History of falling: Secondary | ICD-10-CM | POA: Diagnosis not present

## 2020-05-11 DIAGNOSIS — M21372 Foot drop, left foot: Secondary | ICD-10-CM | POA: Diagnosis not present

## 2020-05-11 DIAGNOSIS — I480 Paroxysmal atrial fibrillation: Secondary | ICD-10-CM | POA: Diagnosis not present

## 2020-05-11 DIAGNOSIS — I70422 Atherosclerosis of autologous vein bypass graft(s) of the extremities with rest pain, left leg: Secondary | ICD-10-CM | POA: Diagnosis not present

## 2020-05-11 DIAGNOSIS — M19041 Primary osteoarthritis, right hand: Secondary | ICD-10-CM | POA: Diagnosis not present

## 2020-05-11 DIAGNOSIS — E785 Hyperlipidemia, unspecified: Secondary | ICD-10-CM | POA: Diagnosis not present

## 2020-05-11 DIAGNOSIS — D631 Anemia in chronic kidney disease: Secondary | ICD-10-CM | POA: Diagnosis not present

## 2020-05-11 DIAGNOSIS — K5901 Slow transit constipation: Secondary | ICD-10-CM | POA: Diagnosis not present

## 2020-05-11 DIAGNOSIS — Z993 Dependence on wheelchair: Secondary | ICD-10-CM | POA: Diagnosis not present

## 2020-05-11 DIAGNOSIS — Z87891 Personal history of nicotine dependence: Secondary | ICD-10-CM | POA: Diagnosis not present

## 2020-05-11 DIAGNOSIS — N183 Chronic kidney disease, stage 3 unspecified: Secondary | ICD-10-CM | POA: Diagnosis not present

## 2020-05-11 DIAGNOSIS — T82868D Thrombosis of vascular prosthetic devices, implants and grafts, subsequent encounter: Secondary | ICD-10-CM | POA: Diagnosis not present

## 2020-05-12 DIAGNOSIS — I130 Hypertensive heart and chronic kidney disease with heart failure and stage 1 through stage 4 chronic kidney disease, or unspecified chronic kidney disease: Secondary | ICD-10-CM | POA: Diagnosis not present

## 2020-05-12 DIAGNOSIS — I509 Heart failure, unspecified: Secondary | ICD-10-CM | POA: Diagnosis not present

## 2020-05-12 DIAGNOSIS — N183 Chronic kidney disease, stage 3 unspecified: Secondary | ICD-10-CM | POA: Diagnosis not present

## 2020-05-12 DIAGNOSIS — T82868D Thrombosis of vascular prosthetic devices, implants and grafts, subsequent encounter: Secondary | ICD-10-CM | POA: Diagnosis not present

## 2020-05-12 DIAGNOSIS — I87323 Chronic venous hypertension (idiopathic) with inflammation of bilateral lower extremity: Secondary | ICD-10-CM | POA: Diagnosis not present

## 2020-05-12 DIAGNOSIS — I70422 Atherosclerosis of autologous vein bypass graft(s) of the extremities with rest pain, left leg: Secondary | ICD-10-CM | POA: Diagnosis not present

## 2020-05-15 ENCOUNTER — Ambulatory Visit (HOSPITAL_COMMUNITY): Admission: RE | Admit: 2020-05-15 | Payer: Medicare Other | Source: Home / Self Care | Admitting: Surgery

## 2020-05-15 ENCOUNTER — Encounter (HOSPITAL_COMMUNITY): Admission: RE | Payer: Self-pay | Source: Home / Self Care

## 2020-05-15 DIAGNOSIS — I70422 Atherosclerosis of autologous vein bypass graft(s) of the extremities with rest pain, left leg: Secondary | ICD-10-CM | POA: Diagnosis not present

## 2020-05-15 DIAGNOSIS — T82868D Thrombosis of vascular prosthetic devices, implants and grafts, subsequent encounter: Secondary | ICD-10-CM | POA: Diagnosis not present

## 2020-05-15 DIAGNOSIS — I87323 Chronic venous hypertension (idiopathic) with inflammation of bilateral lower extremity: Secondary | ICD-10-CM | POA: Diagnosis not present

## 2020-05-15 DIAGNOSIS — I130 Hypertensive heart and chronic kidney disease with heart failure and stage 1 through stage 4 chronic kidney disease, or unspecified chronic kidney disease: Secondary | ICD-10-CM | POA: Diagnosis not present

## 2020-05-15 DIAGNOSIS — N183 Chronic kidney disease, stage 3 unspecified: Secondary | ICD-10-CM | POA: Diagnosis not present

## 2020-05-15 DIAGNOSIS — I509 Heart failure, unspecified: Secondary | ICD-10-CM | POA: Diagnosis not present

## 2020-05-15 SURGERY — ABDOMINAL AORTOGRAM W/LOWER EXTREMITY
Anesthesia: LOCAL

## 2020-05-17 DIAGNOSIS — I87323 Chronic venous hypertension (idiopathic) with inflammation of bilateral lower extremity: Secondary | ICD-10-CM | POA: Diagnosis not present

## 2020-05-17 DIAGNOSIS — I130 Hypertensive heart and chronic kidney disease with heart failure and stage 1 through stage 4 chronic kidney disease, or unspecified chronic kidney disease: Secondary | ICD-10-CM | POA: Diagnosis not present

## 2020-05-17 DIAGNOSIS — T82868D Thrombosis of vascular prosthetic devices, implants and grafts, subsequent encounter: Secondary | ICD-10-CM | POA: Diagnosis not present

## 2020-05-17 DIAGNOSIS — I509 Heart failure, unspecified: Secondary | ICD-10-CM | POA: Diagnosis not present

## 2020-05-17 DIAGNOSIS — N183 Chronic kidney disease, stage 3 unspecified: Secondary | ICD-10-CM | POA: Diagnosis not present

## 2020-05-17 DIAGNOSIS — I70422 Atherosclerosis of autologous vein bypass graft(s) of the extremities with rest pain, left leg: Secondary | ICD-10-CM | POA: Diagnosis not present

## 2020-05-22 DIAGNOSIS — I130 Hypertensive heart and chronic kidney disease with heart failure and stage 1 through stage 4 chronic kidney disease, or unspecified chronic kidney disease: Secondary | ICD-10-CM | POA: Diagnosis not present

## 2020-05-22 DIAGNOSIS — N183 Chronic kidney disease, stage 3 unspecified: Secondary | ICD-10-CM | POA: Diagnosis not present

## 2020-05-22 DIAGNOSIS — T82868D Thrombosis of vascular prosthetic devices, implants and grafts, subsequent encounter: Secondary | ICD-10-CM | POA: Diagnosis not present

## 2020-05-22 DIAGNOSIS — I509 Heart failure, unspecified: Secondary | ICD-10-CM | POA: Diagnosis not present

## 2020-05-22 DIAGNOSIS — I87323 Chronic venous hypertension (idiopathic) with inflammation of bilateral lower extremity: Secondary | ICD-10-CM | POA: Diagnosis not present

## 2020-05-22 DIAGNOSIS — I70422 Atherosclerosis of autologous vein bypass graft(s) of the extremities with rest pain, left leg: Secondary | ICD-10-CM | POA: Diagnosis not present

## 2020-05-23 DIAGNOSIS — T82868D Thrombosis of vascular prosthetic devices, implants and grafts, subsequent encounter: Secondary | ICD-10-CM | POA: Diagnosis not present

## 2020-05-23 DIAGNOSIS — I87323 Chronic venous hypertension (idiopathic) with inflammation of bilateral lower extremity: Secondary | ICD-10-CM | POA: Diagnosis not present

## 2020-05-23 DIAGNOSIS — N183 Chronic kidney disease, stage 3 unspecified: Secondary | ICD-10-CM | POA: Diagnosis not present

## 2020-05-23 DIAGNOSIS — I509 Heart failure, unspecified: Secondary | ICD-10-CM | POA: Diagnosis not present

## 2020-05-23 DIAGNOSIS — I70422 Atherosclerosis of autologous vein bypass graft(s) of the extremities with rest pain, left leg: Secondary | ICD-10-CM | POA: Diagnosis not present

## 2020-05-23 DIAGNOSIS — I130 Hypertensive heart and chronic kidney disease with heart failure and stage 1 through stage 4 chronic kidney disease, or unspecified chronic kidney disease: Secondary | ICD-10-CM | POA: Diagnosis not present

## 2020-05-29 DIAGNOSIS — I70422 Atherosclerosis of autologous vein bypass graft(s) of the extremities with rest pain, left leg: Secondary | ICD-10-CM | POA: Diagnosis not present

## 2020-05-29 DIAGNOSIS — N183 Chronic kidney disease, stage 3 unspecified: Secondary | ICD-10-CM | POA: Diagnosis not present

## 2020-05-29 DIAGNOSIS — I509 Heart failure, unspecified: Secondary | ICD-10-CM | POA: Diagnosis not present

## 2020-05-29 DIAGNOSIS — I87323 Chronic venous hypertension (idiopathic) with inflammation of bilateral lower extremity: Secondary | ICD-10-CM | POA: Diagnosis not present

## 2020-05-29 DIAGNOSIS — T82868D Thrombosis of vascular prosthetic devices, implants and grafts, subsequent encounter: Secondary | ICD-10-CM | POA: Diagnosis not present

## 2020-05-29 DIAGNOSIS — I130 Hypertensive heart and chronic kidney disease with heart failure and stage 1 through stage 4 chronic kidney disease, or unspecified chronic kidney disease: Secondary | ICD-10-CM | POA: Diagnosis not present

## 2020-05-30 ENCOUNTER — Encounter: Payer: Self-pay | Admitting: Surgery

## 2020-05-30 ENCOUNTER — Ambulatory Visit (INDEPENDENT_AMBULATORY_CARE_PROVIDER_SITE_OTHER): Payer: Self-pay | Admitting: Surgery

## 2020-05-30 ENCOUNTER — Other Ambulatory Visit: Payer: Self-pay

## 2020-05-30 VITALS — BP 168/68 | HR 66 | Temp 97.8°F | Resp 20 | Ht 62.0 in | Wt 138.0 lb

## 2020-05-30 DIAGNOSIS — I70213 Atherosclerosis of native arteries of extremities with intermittent claudication, bilateral legs: Secondary | ICD-10-CM

## 2020-05-30 NOTE — Progress Notes (Signed)
Patient name: [name] MRN: [id number] DOB: January 20, 1928 Sex: female  REASON FOR VISIT:     post op  HISTORY OF PRESENT ILLNESS:   [name] is a 86 y.o. female who has undergone multiple procedures on her left leg but came into the emergency department on 05/04/2020 with an ischemic left leg.  I took her to the operating room and perform thrombectomy of her left femoral to anterior tibial bypass graft.  I placed a Viabahn stent at the proximal anastomosis.  Balloon angioplasty of anterior tibial artery was performed.  She was ultimately discharged to home.  She feels her strength is getting stronger every day CURRENT MEDICATIONS:    Current Outpatient Medications  Medication Sig Dispense Refill  . acetaminophen (TYLENOL) 500 MG tablet Take 1,000 mg by mouth every 8 (eight) hours as needed for mild pain.     . Alirocumab (PRALUENT) 75 MG/ML SOAJ Inject 1 pen into the skin every 14 (fourteen) days. 2 pen 11  . amiodarone (PACERONE) 200 MG tablet Take 1 tablet (200 mg total) by mouth daily. Please keep upcoming appt in May before anymore refills. Final Attempt (Patient taking differently: Take 200 mg by mouth daily. ) 30 tablet 0  . clopidogrel (PLAVIX) 75 MG tablet Take 1 tablet (75 mg total) by mouth daily. 30 tablet 1  . Cyanocobalamin (VITAMIN B-12 PO) Place 1 tablet under the tongue daily.    . diazepam (VALIUM) 5 MG tablet Take 0.5 tablets (2.5 mg total) by mouth at bedtime as needed for anxiety. (Patient taking differently: Take 2.5 mg by mouth at bedtime as needed (sleep). ) 20 tablet 0  . furosemide (LASIX) 20 MG tablet Take 20 mg by mouth every Monday, Wednesday, and Friday.    . gabapentin (NEURONTIN) 300 MG capsule Take 300 mg by mouth See admin instructions. Take 300mg  by mouth three times daily.  May take an additional 300mg  at bedtime as needed for pain.    Marland Kitchen loperamide (IMODIUM) 2 MG capsule Take 2 mg by mouth See admin  instructions. Take 2mg  by mouth as needed after each loose stool.  Do not exceed 8 capsules in 24 hours.    . metoprolol tartrate (LOPRESSOR) 25 MG tablet Take 1 tablet (25 mg total) by mouth at bedtime. 30 tablet 1  . Multiple Vitamins-Minerals (IMMUNE SUPPORT VITAMIN C PO) Take 1 tablet by mouth 2 (two) times daily.     . Multiple Vitamins-Minerals (PRESERVISION AREDS PO) Take 1 capsule by mouth in the morning and at bedtime.    Marland Kitchen oxycodone (OXY-IR) 5 MG capsule Take 1 capsule (5 mg total) by mouth every 6 (six) hours as needed for pain. 15 capsule 0  . potassium chloride (K-DUR,KLOR-CON) 10 MEQ tablet Take 10 mEq by mouth daily.    . valsartan-hydrochlorothiazide (DIOVAN-HCT) 160-12.5 MG tablet Take 0.5 tablets by mouth daily. 30 tablet 0  . VITAMIN D PO Take 1 capsule by mouth at bedtime.     No current facility-administered medications for this visit.    REVIEW OF SYSTEMS:   [X]  denotes positive finding, [ ]  denotes negative finding Cardiac  Comments:  Chest pain or chest pressure:    Shortness of breath upon exertion:    Short of breath when lying flat:    Irregular heart rhythm:    Constitutional    Fever or chills:      PHYSICAL EXAM:   Vitals:   05/30/20 0950  BP: (!) 168/68  Pulse: 66  Resp: 20  Temp: 97.8 F (36.6 C)  SpO2: 96%  Weight: 138 lb (62.6 kg)  Height: 5\' 2"  (1.575 m)    GENERAL: The patient is a well-nourished female, in no acute distress. The vital signs are documented above. CARDIOVASCULAR: There is a regular rate and rhythm. PULMONARY: Non-labored respirations Left leg is warm and well-perfused.  Incision is healing nicely  STUDIES:   none   MEDICAL ISSUES:   She is recovering nicely from her thrombectomy.  She is looking for an assisted living place.  I will have her follow-up in 3 months for duplex.  Leia Alf, MD, FACS [facility] [phone] Pager [phone]

## 2020-05-31 ENCOUNTER — Other Ambulatory Visit: Payer: Self-pay | Admitting: *Deleted

## 2020-05-31 DIAGNOSIS — I70213 Atherosclerosis of native arteries of extremities with intermittent claudication, bilateral legs: Secondary | ICD-10-CM

## 2020-05-31 DIAGNOSIS — I739 Peripheral vascular disease, unspecified: Secondary | ICD-10-CM

## 2020-06-06 DIAGNOSIS — I509 Heart failure, unspecified: Secondary | ICD-10-CM | POA: Diagnosis not present

## 2020-06-06 DIAGNOSIS — I87323 Chronic venous hypertension (idiopathic) with inflammation of bilateral lower extremity: Secondary | ICD-10-CM | POA: Diagnosis not present

## 2020-06-06 DIAGNOSIS — T82868D Thrombosis of vascular prosthetic devices, implants and grafts, subsequent encounter: Secondary | ICD-10-CM | POA: Diagnosis not present

## 2020-06-06 DIAGNOSIS — N183 Chronic kidney disease, stage 3 unspecified: Secondary | ICD-10-CM | POA: Diagnosis not present

## 2020-06-06 DIAGNOSIS — I130 Hypertensive heart and chronic kidney disease with heart failure and stage 1 through stage 4 chronic kidney disease, or unspecified chronic kidney disease: Secondary | ICD-10-CM | POA: Diagnosis not present

## 2020-06-06 DIAGNOSIS — I70422 Atherosclerosis of autologous vein bypass graft(s) of the extremities with rest pain, left leg: Secondary | ICD-10-CM | POA: Diagnosis not present

## 2020-06-08 DIAGNOSIS — I130 Hypertensive heart and chronic kidney disease with heart failure and stage 1 through stage 4 chronic kidney disease, or unspecified chronic kidney disease: Secondary | ICD-10-CM | POA: Diagnosis not present

## 2020-06-08 DIAGNOSIS — I87323 Chronic venous hypertension (idiopathic) with inflammation of bilateral lower extremity: Secondary | ICD-10-CM | POA: Diagnosis not present

## 2020-06-08 DIAGNOSIS — T82868D Thrombosis of vascular prosthetic devices, implants and grafts, subsequent encounter: Secondary | ICD-10-CM | POA: Diagnosis not present

## 2020-06-08 DIAGNOSIS — I509 Heart failure, unspecified: Secondary | ICD-10-CM | POA: Diagnosis not present

## 2020-06-08 DIAGNOSIS — N183 Chronic kidney disease, stage 3 unspecified: Secondary | ICD-10-CM | POA: Diagnosis not present

## 2020-06-08 DIAGNOSIS — I70422 Atherosclerosis of autologous vein bypass graft(s) of the extremities with rest pain, left leg: Secondary | ICD-10-CM | POA: Diagnosis not present

## 2020-06-10 DIAGNOSIS — K5901 Slow transit constipation: Secondary | ICD-10-CM | POA: Diagnosis not present

## 2020-06-10 DIAGNOSIS — G894 Chronic pain syndrome: Secondary | ICD-10-CM | POA: Diagnosis not present

## 2020-06-10 DIAGNOSIS — T82868D Thrombosis of vascular prosthetic devices, implants and grafts, subsequent encounter: Secondary | ICD-10-CM | POA: Diagnosis not present

## 2020-06-10 DIAGNOSIS — D631 Anemia in chronic kidney disease: Secondary | ICD-10-CM | POA: Diagnosis not present

## 2020-06-10 DIAGNOSIS — H54413A Blindness right eye category 3, normal vision left eye: Secondary | ICD-10-CM | POA: Diagnosis not present

## 2020-06-10 DIAGNOSIS — N183 Chronic kidney disease, stage 3 unspecified: Secondary | ICD-10-CM | POA: Diagnosis not present

## 2020-06-10 DIAGNOSIS — E785 Hyperlipidemia, unspecified: Secondary | ICD-10-CM | POA: Diagnosis not present

## 2020-06-10 DIAGNOSIS — I509 Heart failure, unspecified: Secondary | ICD-10-CM | POA: Diagnosis not present

## 2020-06-10 DIAGNOSIS — Z993 Dependence on wheelchair: Secondary | ICD-10-CM | POA: Diagnosis not present

## 2020-06-10 DIAGNOSIS — Z87891 Personal history of nicotine dependence: Secondary | ICD-10-CM | POA: Diagnosis not present

## 2020-06-10 DIAGNOSIS — Z89511 Acquired absence of right leg below knee: Secondary | ICD-10-CM | POA: Diagnosis not present

## 2020-06-10 DIAGNOSIS — I70422 Atherosclerosis of autologous vein bypass graft(s) of the extremities with rest pain, left leg: Secondary | ICD-10-CM | POA: Diagnosis not present

## 2020-06-10 DIAGNOSIS — I480 Paroxysmal atrial fibrillation: Secondary | ICD-10-CM | POA: Diagnosis not present

## 2020-06-10 DIAGNOSIS — I130 Hypertensive heart and chronic kidney disease with heart failure and stage 1 through stage 4 chronic kidney disease, or unspecified chronic kidney disease: Secondary | ICD-10-CM | POA: Diagnosis not present

## 2020-06-10 DIAGNOSIS — Z9181 History of falling: Secondary | ICD-10-CM | POA: Diagnosis not present

## 2020-06-10 DIAGNOSIS — M19041 Primary osteoarthritis, right hand: Secondary | ICD-10-CM | POA: Diagnosis not present

## 2020-06-10 DIAGNOSIS — M792 Neuralgia and neuritis, unspecified: Secondary | ICD-10-CM | POA: Diagnosis not present

## 2020-06-10 DIAGNOSIS — Z7902 Long term (current) use of antithrombotics/antiplatelets: Secondary | ICD-10-CM | POA: Diagnosis not present

## 2020-06-10 DIAGNOSIS — M21372 Foot drop, left foot: Secondary | ICD-10-CM | POA: Diagnosis not present

## 2020-06-10 DIAGNOSIS — I87323 Chronic venous hypertension (idiopathic) with inflammation of bilateral lower extremity: Secondary | ICD-10-CM | POA: Diagnosis not present

## 2020-06-10 DIAGNOSIS — M19042 Primary osteoarthritis, left hand: Secondary | ICD-10-CM | POA: Diagnosis not present

## 2020-06-10 DIAGNOSIS — M797 Fibromyalgia: Secondary | ICD-10-CM | POA: Diagnosis not present

## 2020-06-12 ENCOUNTER — Other Ambulatory Visit: Payer: Self-pay | Admitting: Interventional Cardiology

## 2020-06-12 DIAGNOSIS — T82868D Thrombosis of vascular prosthetic devices, implants and grafts, subsequent encounter: Secondary | ICD-10-CM | POA: Diagnosis not present

## 2020-06-12 DIAGNOSIS — I509 Heart failure, unspecified: Secondary | ICD-10-CM | POA: Diagnosis not present

## 2020-06-12 DIAGNOSIS — I70422 Atherosclerosis of autologous vein bypass graft(s) of the extremities with rest pain, left leg: Secondary | ICD-10-CM | POA: Diagnosis not present

## 2020-06-12 DIAGNOSIS — N183 Chronic kidney disease, stage 3 unspecified: Secondary | ICD-10-CM | POA: Diagnosis not present

## 2020-06-12 DIAGNOSIS — I87323 Chronic venous hypertension (idiopathic) with inflammation of bilateral lower extremity: Secondary | ICD-10-CM | POA: Diagnosis not present

## 2020-06-12 DIAGNOSIS — I130 Hypertensive heart and chronic kidney disease with heart failure and stage 1 through stage 4 chronic kidney disease, or unspecified chronic kidney disease: Secondary | ICD-10-CM | POA: Diagnosis not present

## 2020-06-13 DIAGNOSIS — Z111 Encounter for screening for respiratory tuberculosis: Secondary | ICD-10-CM | POA: Diagnosis not present

## 2020-06-14 DIAGNOSIS — E7800 Pure hypercholesterolemia, unspecified: Secondary | ICD-10-CM | POA: Diagnosis not present

## 2020-06-14 DIAGNOSIS — I10 Essential (primary) hypertension: Secondary | ICD-10-CM | POA: Diagnosis not present

## 2020-06-14 DIAGNOSIS — M810 Age-related osteoporosis without current pathological fracture: Secondary | ICD-10-CM | POA: Diagnosis not present

## 2020-06-14 DIAGNOSIS — M1990 Unspecified osteoarthritis, unspecified site: Secondary | ICD-10-CM | POA: Diagnosis not present

## 2020-06-14 DIAGNOSIS — E782 Mixed hyperlipidemia: Secondary | ICD-10-CM | POA: Diagnosis not present

## 2020-06-14 DIAGNOSIS — M169 Osteoarthritis of hip, unspecified: Secondary | ICD-10-CM | POA: Diagnosis not present

## 2020-06-15 DIAGNOSIS — Z20828 Contact with and (suspected) exposure to other viral communicable diseases: Secondary | ICD-10-CM | POA: Diagnosis not present

## 2020-06-15 DIAGNOSIS — Z111 Encounter for screening for respiratory tuberculosis: Secondary | ICD-10-CM | POA: Diagnosis not present

## 2020-06-18 DIAGNOSIS — I130 Hypertensive heart and chronic kidney disease with heart failure and stage 1 through stage 4 chronic kidney disease, or unspecified chronic kidney disease: Secondary | ICD-10-CM | POA: Diagnosis not present

## 2020-06-18 DIAGNOSIS — I87323 Chronic venous hypertension (idiopathic) with inflammation of bilateral lower extremity: Secondary | ICD-10-CM | POA: Diagnosis not present

## 2020-06-18 DIAGNOSIS — I70422 Atherosclerosis of autologous vein bypass graft(s) of the extremities with rest pain, left leg: Secondary | ICD-10-CM | POA: Diagnosis not present

## 2020-06-18 DIAGNOSIS — T82868D Thrombosis of vascular prosthetic devices, implants and grafts, subsequent encounter: Secondary | ICD-10-CM | POA: Diagnosis not present

## 2020-06-18 DIAGNOSIS — I509 Heart failure, unspecified: Secondary | ICD-10-CM | POA: Diagnosis not present

## 2020-06-18 DIAGNOSIS — N183 Chronic kidney disease, stage 3 unspecified: Secondary | ICD-10-CM | POA: Diagnosis not present

## 2020-06-25 DIAGNOSIS — M6281 Muscle weakness (generalized): Secondary | ICD-10-CM | POA: Diagnosis not present

## 2020-06-25 DIAGNOSIS — R2681 Unsteadiness on feet: Secondary | ICD-10-CM | POA: Diagnosis not present

## 2020-06-25 DIAGNOSIS — Z89511 Acquired absence of right leg below knee: Secondary | ICD-10-CM | POA: Diagnosis not present

## 2020-06-27 DIAGNOSIS — M6281 Muscle weakness (generalized): Secondary | ICD-10-CM | POA: Diagnosis not present

## 2020-06-27 DIAGNOSIS — R278 Other lack of coordination: Secondary | ICD-10-CM | POA: Diagnosis not present

## 2020-06-27 DIAGNOSIS — Z89511 Acquired absence of right leg below knee: Secondary | ICD-10-CM | POA: Diagnosis not present

## 2020-06-27 DIAGNOSIS — R2681 Unsteadiness on feet: Secondary | ICD-10-CM | POA: Diagnosis not present

## 2020-06-29 DIAGNOSIS — M6281 Muscle weakness (generalized): Secondary | ICD-10-CM | POA: Diagnosis not present

## 2020-06-29 DIAGNOSIS — R278 Other lack of coordination: Secondary | ICD-10-CM | POA: Diagnosis not present

## 2020-07-03 DIAGNOSIS — M6281 Muscle weakness (generalized): Secondary | ICD-10-CM | POA: Diagnosis not present

## 2020-07-03 DIAGNOSIS — R278 Other lack of coordination: Secondary | ICD-10-CM | POA: Diagnosis not present

## 2020-07-04 DIAGNOSIS — R278 Other lack of coordination: Secondary | ICD-10-CM | POA: Diagnosis not present

## 2020-07-04 DIAGNOSIS — M6281 Muscle weakness (generalized): Secondary | ICD-10-CM | POA: Diagnosis not present

## 2020-07-05 DIAGNOSIS — R278 Other lack of coordination: Secondary | ICD-10-CM | POA: Diagnosis not present

## 2020-07-05 DIAGNOSIS — M6281 Muscle weakness (generalized): Secondary | ICD-10-CM | POA: Diagnosis not present

## 2020-07-06 DIAGNOSIS — M6281 Muscle weakness (generalized): Secondary | ICD-10-CM | POA: Diagnosis not present

## 2020-07-06 DIAGNOSIS — R278 Other lack of coordination: Secondary | ICD-10-CM | POA: Diagnosis not present

## 2020-07-09 DIAGNOSIS — M6281 Muscle weakness (generalized): Secondary | ICD-10-CM | POA: Diagnosis not present

## 2020-07-09 DIAGNOSIS — R278 Other lack of coordination: Secondary | ICD-10-CM | POA: Diagnosis not present

## 2020-07-10 DIAGNOSIS — M6281 Muscle weakness (generalized): Secondary | ICD-10-CM | POA: Diagnosis not present

## 2020-07-10 DIAGNOSIS — R278 Other lack of coordination: Secondary | ICD-10-CM | POA: Diagnosis not present

## 2020-07-11 DIAGNOSIS — M6281 Muscle weakness (generalized): Secondary | ICD-10-CM | POA: Diagnosis not present

## 2020-07-11 DIAGNOSIS — I10 Essential (primary) hypertension: Secondary | ICD-10-CM | POA: Diagnosis not present

## 2020-07-11 DIAGNOSIS — M1990 Unspecified osteoarthritis, unspecified site: Secondary | ICD-10-CM | POA: Diagnosis not present

## 2020-07-11 DIAGNOSIS — E782 Mixed hyperlipidemia: Secondary | ICD-10-CM | POA: Diagnosis not present

## 2020-07-11 DIAGNOSIS — M810 Age-related osteoporosis without current pathological fracture: Secondary | ICD-10-CM | POA: Diagnosis not present

## 2020-07-11 DIAGNOSIS — R278 Other lack of coordination: Secondary | ICD-10-CM | POA: Diagnosis not present

## 2020-07-11 DIAGNOSIS — E7800 Pure hypercholesterolemia, unspecified: Secondary | ICD-10-CM | POA: Diagnosis not present

## 2020-07-11 DIAGNOSIS — M169 Osteoarthritis of hip, unspecified: Secondary | ICD-10-CM | POA: Diagnosis not present

## 2020-07-12 DIAGNOSIS — M6281 Muscle weakness (generalized): Secondary | ICD-10-CM | POA: Diagnosis not present

## 2020-07-12 DIAGNOSIS — R278 Other lack of coordination: Secondary | ICD-10-CM | POA: Diagnosis not present

## 2020-07-16 DIAGNOSIS — M6281 Muscle weakness (generalized): Secondary | ICD-10-CM | POA: Diagnosis not present

## 2020-07-16 DIAGNOSIS — R278 Other lack of coordination: Secondary | ICD-10-CM | POA: Diagnosis not present

## 2020-08-05 NOTE — Progress Notes (Deleted)
Cardiology Office Note:    Date:  08/05/2020   ID:  [name], DOB [birth year], MRN [id number]  PCP:  Josetta Huddle, MD  Cardiologist:  No primary care provider on file.   Referring MD: Josetta Huddle, MD   No chief complaint on file.   History of Present Illness:    [name] is a 86 y.o. female with a hx of  PAF, amiodarone therapy,  PAD with CLI,, DVT, prior GI bleeding on anticoagulation, CAD with angina, prior TIA, and elderly with frailty.  ***  Past Medical History:  Diagnosis Date  . Anemia   . Anginal pain ([place])   . Arthritis    "qwhere" (09/05/2016)  . Atrial fibrillation ([place]) 09/2016  . Chronic lower back pain   . Complication of anesthesia    "takes a long time for it to wear off; I can hallucinate if I take too much" (09/05/2016)  . DVT (deep venous thrombosis) ([place]) 10/2009  . Fall from steps 08/31/2013   Fx. pelvis, Left Hip, Left Elbow  . Fibromyalgia   . GERD (gastroesophageal reflux disease)    09/22/16- "no too much anymore"  . GI bleed 10/24/2016  . High cholesterol   . History of blood transfusion   . History of hiatal hernia   . Hypertension   . Macular degeneration, wet ([place])    "started in right eye; now legally blind in that eye; now started in left eye but pretty much in control" (09/05/2016)  . Osteoporosis   . Peripheral vascular disease ([place])    nonviable tissue Right foot  . PONV (postoperative nausea and vomiting)   . Squamous cell carcinoma of skin of right calf Aug. 2015  . Stroke ([place])    TIA's no residual  . TIA (transient ischemic attack)    "several at once; none in a long time" (09/05/2016)    Past Surgical History:  Procedure Laterality Date  . ABDOMINAL AORTAGRAM N/A 12/26/2014   Procedure: ABDOMINAL Maxcine Ham;  Surgeon: Serafina Mitchell, MD;  Location: [facility] CATH LAB;  Service: Cardiovascular;  Laterality: N/A;  . AMPUTATION Right 12/23/2016   Procedure: AMPUTATION BELOW KNEE;  Surgeon: Serafina Mitchell, MD;   Location: [place];  Service: Vascular;  Laterality: Right;  . AORTOGRAM N/A 09/26/2016   Procedure: AORTOGRAM;  Surgeon: Waynetta Sandy, MD;  Location: [place];  Service: Vascular;  Laterality: N/A;  . CARPAL TUNNEL RELEASE Right   . CATARACT EXTRACTION W/ INTRAOCULAR LENS  IMPLANT, BILATERAL Bilateral   . COLONOSCOPY    . DILATION AND CURETTAGE OF UTERUS    . DRESSING CHANGE UNDER ANESTHESIA Right 01/16/2017   Procedure: DRESSING CHANGE RIGHT BELOW KNEE AMPUTATION;  Surgeon: Serafina Mitchell, MD;  Location: [place];  Service: Vascular;  Laterality: Right;  . EYE SURGERY Right    "macular OR"  . FEMORAL ARTERY STENT  12-11-10   Left SFA  . FEMORAL-POPLITEAL BYPASS GRAFT Left 09/24/2016   Procedure: REDO FEMORAL TO POPLITEAL ARTERY BYPASS GRAFT USING 6MM PROPATEN RINGED GORTEX GRAFT;  Surgeon: Serafina Mitchell, MD;  Location: [place];  Service: Vascular;  Laterality: Left;  . FEMORAL-TIBIAL BYPASS GRAFT Left 01/16/2017   Procedure: REDO BYPASS GRAFT FEMORAL-TIBIAL ARTERY WITH GORTEX GRAFT;  Surgeon: Serafina Mitchell, MD;  Location: [place];  Service: Vascular;  Laterality: Left;  AND LOWER LEG   . I & D EXTREMITY Right 12/17/2016   Procedure: IRRIGATION AND DEBRIDEMENT RIGHT FOOT;  Surgeon: Serafina Mitchell, MD;  Location: [facility] OR;  Service: Vascular;  Laterality: Right;  . INCISION AND DRAINAGE OF WOUND Left 10/25/2009   leg/notes 11/13/2009  . INSERTION OF ILIAC STENT Left 12/26/2014   Procedure: INSERTION OF ILIAC STENT;  Surgeon: Serafina Mitchell, MD;  Location: [facility] CATH LAB;  Service: Cardiovascular;  Laterality: Left;  . INSERTION OF ILIAC STENT Left 09/26/2016   Procedure: SUB INTIMAL INSERTION OF SUPERFICIAL FEMORAL ARTERY AND BELOW KNEE BYPASS GRAFT;  Surgeon: Waynetta Sandy, MD;  Location: [place];  Service: Vascular;  Laterality: Left;  . INSERTION OF ILIAC STENT Left 05/04/2020   Procedure: INSERTION OF SUPERFICIAL FEMORAL ARTERY BYPASS GRAFT STENT;  Surgeon: Serafina Mitchell, MD;   Location: [place];  Service: Vascular;  Laterality: Left;  . JOINT REPLACEMENT     knee  . JOINT REPLACEMENT Left Oct. 17, 2014   Elbow ( pt fell 08-31-13 )  . LOWER EXTREMITY ANGIOGRAM Left 12/27/2019   Procedure: Intraoperative Left Lower Extremity Angiogram;  Surgeon: Angelia Mould, MD;  Location: [place];  Service: Vascular;  Laterality: Left;  . LOWER EXTREMITY ANGIOGRAPHY N/A 09/21/2018   Procedure: LOWER EXTREMITY ANGIOGRAPHY;  Surgeon: Serafina Mitchell, MD;  Location: [place] CV LAB;  Service: Cardiovascular;  Laterality: N/A;  . ORIF SHOULDER FRACTURE Right    "it was crushed"  . PATCH ANGIOPLASTY Left 05/04/2020   Procedure: BALLOON  ANGIOPLASTY;  Surgeon: Serafina Mitchell, MD;  [facility] OR;  Service: Vascular;  Laterality: Left;  . PERIPHERAL VASCULAR CATHETERIZATION N/A 10/30/2015   Procedure: Abdominal Aortogram w/Lower Extremity;  Surgeon: Serafina Mitchell, MD;  Location: [place] CV LAB;  Service: Cardiovascular;  Laterality: N/A;  . PERIPHERAL VASCULAR CATHETERIZATION  10/30/2015   Procedure: Peripheral Vascular Intervention;  Surgeon: Serafina Mitchell, MD;  Location: [place] CV LAB;  Service: Cardiovascular;;  . PERIPHERAL VASCULAR CATHETERIZATION N/A 04/01/2016   Procedure: Abdominal Aortogram w/Lower Extremity;  Surgeon: Serafina Mitchell, MD;  Location: [place] CV LAB;  Service: Cardiovascular;  Laterality: N/A;  . PERIPHERAL VASCULAR CATHETERIZATION Left 04/01/2016   Procedure: Peripheral Vascular Atherectomy;  Surgeon: Serafina Mitchell, MD;  Location: [place] CV LAB;  Service: Cardiovascular;  Laterality: Left;  Superficial femoral artery.  Marland Kitchen PERIPHERAL VASCULAR CATHETERIZATION Right 09/05/2016   "stent"  . PERIPHERAL VASCULAR CATHETERIZATION N/A 09/05/2016   Procedure: Abdominal Aortogram w/Lower Extremity;  Surgeon: Serafina Mitchell, MD;  Location: [place] CV LAB;  Service: Cardiovascular;  Laterality: N/A;  . PERIPHERAL VASCULAR CATHETERIZATION  Right 09/05/2016   Procedure: Peripheral Vascular Intervention;  Surgeon: Serafina Mitchell, MD;  Location: [place] CV LAB;  Service: Cardiovascular;  Laterality: Right;  Superficial Femoral  . PERIPHERAL VASCULAR CATHETERIZATION Left 09/09/2016   Procedure: Lower Extremity Angiography;  Surgeon: Serafina Mitchell, MD;  Location: [place] CV LAB;  Service: Cardiovascular;  Laterality: Left;  . PR VEIN BYPASS GRAFT,AORTO-FEM-POP  09-13-09   Left Fem-pop  . THROMBECTOMY FEMORAL ARTERY Right 09/26/2016   Procedure: Thromboembolectomy Right Lower Extremity, Right Femoral Artery Endarterectomy with Patch Angioplasty; Right Lower Extremity Angiogram ;  Surgeon: Waynetta Sandy, MD;  Location: [place];  Service: Vascular;  Laterality: Right;  . THROMBECTOMY FEMORAL ARTERY Left 12/27/2019   Procedure: Redo left femoral artery exposure, Thrombectomy of left deep femoral artery to anterior tibial artery bypass;  Surgeon: Angelia Mould, MD;  Location: [facility] OR;  Service: Vascular;  Laterality: Left;  . THROMBECTOMY FEMORAL ARTERY Left 05/04/2020   Procedure: THROMBECTOMY LEFT LEG;  Surgeon: Serafina Mitchell, MD;  Location: [facility] OR;  Service: Vascular;  Laterality: Left;  . TOTAL ELBOW ARTHROPLASTY Left 09/03/2013   Procedure: LEFT TOTAL ELBOW ARTHROPLASTY;  Surgeon: Roseanne Kaufman, MD;  Location: [place];  Service: Orthopedics;  Laterality: Left;  . TOTAL KNEE ARTHROPLASTY Left 06/2006  . TRANSMETATARSAL AMPUTATION Right 12/11/2016   Procedure: TRANSMETATARSAL AMPUTATION;  Surgeon: Serafina Mitchell, MD;  Location: [place];  Service: Vascular;  Laterality: Right;  . TUBAL LIGATION    . VAGINAL HYSTERECTOMY      Current Medications: No outpatient medications have been marked as taking for the 08/06/20 encounter (Appointment) with Belva Crome, MD.     Allergies:   Motrin [ibuprofen], Statins, Eliquis [apixaban], Morphine and related, Promethazine hcl, and Sulfa antibiotics   Social History    Socioeconomic History  . Marital status: Widowed    Spouse name: Not on file  . Number of children: Not on file  . Years of education: Not on file  . Highest education level: Not on file  Occupational History  . Not on file  Tobacco Use  . Smoking status: Former Smoker    Types: Cigarettes    Quit date: 11/17/1946    Years since quitting: 73.7  . Smokeless tobacco: Never Used  . Tobacco comment: "never smoked much"  Vaping Use  . Vaping Use: Never used  Substance and Sexual Activity  . Alcohol use: No    Alcohol/week: 0.0 standard drinks  . Drug use: No  . Sexual activity: Not on file  Other Topics Concern  . Not on file  Social History Narrative  . Not on file   Social Determinants of Health   Financial Resource Strain:   . Difficulty of Paying Living Expenses: Not on file  Food Insecurity:   . Worried About Charity fundraiser in the Last Year: Not on file  . Ran Out of Food in the Last Year: Not on file  Transportation Needs:   . Lack of Transportation (Medical): Not on file  . Lack of Transportation (Non-Medical): Not on file  Physical Activity:   . Days of Exercise per Week: Not on file  . Minutes of Exercise per Session: Not on file  Stress:   . Feeling of Stress : Not on file  Social Connections:   . Frequency of Communication with Friends and Family: Not on file  . Frequency of Social Gatherings with Friends and Family: Not on file  . Attends Religious Services: Not on file  . Active Member of Clubs or Organizations: Not on file  . Attends Archivist Meetings: Not on file  . Marital Status: Not on file     Family History: The patient's family history includes Alcohol abuse in her father; Deep vein thrombosis in her brother; Diabetes in her son; Heart attack in her son; Heart disease in her brother, father, and son; Hyperlipidemia in her brother, father, sister, and son; Hypertension in her brother, father, sister, son, and son.  ROS:   Please  see the history of present illness.    *** All other systems reviewed and are negative.  EKGs/Labs/Other Studies Reviewed:    The following studies were reviewed today: ***  EKG:  EKG ***  Recent Labs: 12/31/2019: Magnesium 1.9 01/09/2020: TSH 4.024 05/05/2020: ALT 9; BUN 31; Creatinine, Ser 1.60; Potassium 4.5; Sodium 136 05/09/2020: Hemoglobin 7.5; Platelets 218  Recent Lipid Panel    Component Value Date/Time   CHOL 338 (H) 04/20/2020  1116   TRIG 420 (H) 04/20/2020 1116   HDL 37 (L) 04/20/2020 1116   CHOLHDL 9.1 (H) 04/20/2020 1116   CHOLHDL 6.5 02/11/2009 1850   VLDL 68 (H) 02/11/2009 1850   LDLCALC 212 (H) 04/20/2020 1116   LDLDIRECT 226 (H) 04/20/2020 1116    Physical Exam:    VS:  There were no vitals taken for this visit.    Wt Readings from Last 3 Encounters:  05/30/20 138 lb (62.6 kg)  05/04/20 138 lb 3.2 oz (62.7 kg)  04/23/20 132 lb (59.9 kg)     GEN: ***. No acute distress HEENT: Normal NECK: No JVD. LYMPHATICS: No lymphadenopathy CARDIAC: *** RRR without murmur, gallop, or edema. VASCULAR: *** Normal Pulses. No bruits. RESPIRATORY:  Clear to auscultation without rales, wheezing or rhonchi  ABDOMEN: Soft, non-tender, non-distended, No pulsatile mass, MUSCULOSKELETAL: No deformity  SKIN: Warm and dry NEUROLOGIC:  Alert and oriented x 3 PSYCHIATRIC:  Normal affect   ASSESSMENT:    1. Paroxysmal atrial fibrillation (HCC)   2. PAF (paroxysmal atrial fibrillation) ([place])   3. Hyperlipidemia, unspecified hyperlipidemia type   4. PAD (peripheral artery disease) ([place])   5. Atherosclerosis of native artery of both lower extremities with intermittent claudication ([place])   6. Mixed hyperlipidemia   7. Statin intolerance   8. Long term current use of amiodarone   9. Educated about COVID-19 virus infection    PLAN:    In order of problems listed above:  1. ***   Medication Adjustments/Labs and Tests Ordered: Current medicines are reviewed at length  with the patient today.  Concerns regarding medicines are outlined above.  No orders of the defined types were placed in this encounter.  No orders of the defined types were placed in this encounter.   There are no Patient Instructions on file for this visit.   Signed, Sinclair Grooms, MD  08/05/2020 11:08 AM    [place]

## 2020-08-06 ENCOUNTER — Ambulatory Visit: Payer: Medicare Other | Admitting: Interventional Cardiology

## 2020-08-06 ENCOUNTER — Other Ambulatory Visit: Payer: Medicare Other

## 2020-08-16 DIAGNOSIS — E7800 Pure hypercholesterolemia, unspecified: Secondary | ICD-10-CM | POA: Diagnosis not present

## 2020-08-16 DIAGNOSIS — E782 Mixed hyperlipidemia: Secondary | ICD-10-CM | POA: Diagnosis not present

## 2020-08-16 DIAGNOSIS — I10 Essential (primary) hypertension: Secondary | ICD-10-CM | POA: Diagnosis not present

## 2020-08-16 DIAGNOSIS — M169 Osteoarthritis of hip, unspecified: Secondary | ICD-10-CM | POA: Diagnosis not present

## 2020-08-16 DIAGNOSIS — M1990 Unspecified osteoarthritis, unspecified site: Secondary | ICD-10-CM | POA: Diagnosis not present

## 2020-08-16 DIAGNOSIS — M810 Age-related osteoporosis without current pathological fracture: Secondary | ICD-10-CM | POA: Diagnosis not present

## 2020-08-22 ENCOUNTER — Other Ambulatory Visit: Payer: Self-pay

## 2020-08-22 ENCOUNTER — Encounter (HOSPITAL_BASED_OUTPATIENT_CLINIC_OR_DEPARTMENT_OTHER): Payer: Self-pay | Admitting: *Deleted

## 2020-08-22 ENCOUNTER — Emergency Department (HOSPITAL_BASED_OUTPATIENT_CLINIC_OR_DEPARTMENT_OTHER)
Admission: EM | Admit: 2020-08-22 | Discharge: 2020-08-23 | Disposition: A | Discharge status: Home / Self Care | Payer: Medicare Other | Attending: Emergency Medicine | Admitting: Emergency Medicine

## 2020-08-22 DIAGNOSIS — N3090 Cystitis, unspecified without hematuria: Secondary | ICD-10-CM | POA: Diagnosis not present

## 2020-08-22 DIAGNOSIS — Z87891 Personal history of nicotine dependence: Secondary | ICD-10-CM | POA: Insufficient documentation

## 2020-08-22 DIAGNOSIS — Z96622 Presence of left artificial elbow joint: Secondary | ICD-10-CM | POA: Insufficient documentation

## 2020-08-22 DIAGNOSIS — N183 Chronic kidney disease, stage 3 unspecified: Secondary | ICD-10-CM | POA: Diagnosis not present

## 2020-08-22 DIAGNOSIS — N3091 Cystitis, unspecified with hematuria: Secondary | ICD-10-CM | POA: Diagnosis not present

## 2020-08-22 DIAGNOSIS — Z96652 Presence of left artificial knee joint: Secondary | ICD-10-CM | POA: Insufficient documentation

## 2020-08-22 DIAGNOSIS — Z85828 Personal history of other malignant neoplasm of skin: Secondary | ICD-10-CM | POA: Diagnosis not present

## 2020-08-22 DIAGNOSIS — Z79899 Other long term (current) drug therapy: Secondary | ICD-10-CM | POA: Diagnosis not present

## 2020-08-22 DIAGNOSIS — I5043 Acute on chronic combined systolic (congestive) and diastolic (congestive) heart failure: Secondary | ICD-10-CM | POA: Insufficient documentation

## 2020-08-22 DIAGNOSIS — I130 Hypertensive heart and chronic kidney disease with heart failure and stage 1 through stage 4 chronic kidney disease, or unspecified chronic kidney disease: Secondary | ICD-10-CM | POA: Diagnosis not present

## 2020-08-22 DIAGNOSIS — R319 Hematuria, unspecified: Secondary | ICD-10-CM | POA: Diagnosis present

## 2020-08-22 LAB — URINALYSIS, MICROSCOPIC (REFLEX)

## 2020-08-22 LAB — URINALYSIS, ROUTINE W REFLEX MICROSCOPIC
Bilirubin Urine: NEGATIVE
Glucose, UA: NEGATIVE mg/dL
Ketones, ur: NEGATIVE mg/dL
Nitrite: NEGATIVE
Protein, ur: NEGATIVE mg/dL
Specific Gravity, Urine: 1.02 (ref 1.005–1.030)
pH: 6 (ref 5.0–8.0)

## 2020-08-22 MED ORDER — CIPROFLOXACIN HCL 500 MG PO TABS
500.0000 mg | ORAL_TABLET | Freq: Once | ORAL | Status: AC
  Administered 2020-08-22: 500 mg via ORAL
  Filled 2020-08-22: qty 1

## 2020-08-22 MED ORDER — CIPROFLOXACIN HCL 500 MG PO TABS
500.0000 mg | ORAL_TABLET | Freq: Two times a day (BID) | ORAL | 0 refills | Status: DC
Start: 2020-08-22 — End: 2021-08-14

## 2020-08-22 NOTE — ED Triage Notes (Signed)
Pt c/o hematuria x 1 day also c/o lower pelvic pain

## 2020-08-22 NOTE — ED Provider Notes (Signed)
[facility] Provider Note   CSN: [id number] Arrival date & time: 08/22/20  2032     History Chief Complaint  Patient presents with  . Hematuria    [name] is a 86 y.o. female.  Noticed blood in the urine around noon today. Has urinated 2 more times, more blood each time. Pressure with urination. No fever, nausea or vomiting.         Past Medical History:  Diagnosis Date  . Anemia   . Anginal pain ([place])   . Arthritis    "qwhere" (09/05/2016)  . Atrial fibrillation ([place]) 09/2016  . Chronic lower back pain   . Complication of anesthesia    "takes a long time for it to wear off; I can hallucinate if I take too much" (09/05/2016)  . DVT (deep venous thrombosis) ([place]) 10/2009  . Fall from steps 08/31/2013   Fx. pelvis, Left Hip, Left Elbow  . Fibromyalgia   . GERD (gastroesophageal reflux disease)    09/22/16- "no too much anymore"  . GI bleed 10/24/2016  . High cholesterol   . History of blood transfusion   . History of hiatal hernia   . Hypertension   . Macular degeneration, wet ([place])    "started in right eye; now legally blind in that eye; now started in left eye but pretty much in control" (09/05/2016)  . Osteoporosis   . Peripheral vascular disease ([place])    nonviable tissue Right foot  . PONV (postoperative nausea and vomiting)   . Squamous cell carcinoma of skin of right calf Aug. 2015  . Stroke [facility])    TIA's no residual  . TIA (transient ischemic attack)    "several at once; none in a long time" (09/05/2016)    Patient Active Problem List   Diagnosis Date Noted  . Thrombosis 05/04/2020  . Secondary hypercoagulable state ([place]) 01/09/2020  . Critical lower limb ischemia ([place]) 12/28/2019  . Plantar fasciitis of left foot 06/10/2017  . Idiopathic chronic venous hypertension of both lower extremities with inflammation 05/21/2017  . Foot drop, left 05/21/2017  . Decubitus ulcer of sacral region, unstageable ([place])   .  Deep tissue injury   . Hypokalemia   . Hypoalbuminemia due to protein-calorie malnutrition ([place])   . Debilitated 01/21/2017  . Neuropathic pain   . Post-operative pain   . Slow transit constipation   . Debility   . Ischemic leg   . PAF (paroxysmal atrial fibrillation) ([place])   . Anemia of chronic disease   . Chronic pain syndrome   . Fibromyalgia   . Stage 3 chronic kidney disease ([place])   . Benign essential HTN   . Abnormal urinalysis   . PVD (peripheral vascular disease) ([place]) 01/13/2017  . Post-op pain 01/03/2017  . Phantom limb pain ([place]) 01/03/2017  . S/P unilateral BKA (below knee amputation), right ([place]) 12/30/2016  . Acute on chronic combined systolic and diastolic CHF (congestive heart failure) ([place]) 12/22/2016  . Anemia 12/22/2016  . Palliative care encounter   . Lower extremity pain, right   . Acute GI bleeding 10/23/2016  . Paroxysmal atrial fibrillation ([place]) 10/03/2016  . Atherosclerosis of autologous vein bypass graft of left lower extremity with rest pain ([place]) 09/24/2016  . PAD (peripheral artery disease) ([place]) 09/05/2016  . Groin pain 04/02/2015  . Aftercare following surgery of the circulatory system, [place] 12/13/2013  . Shingles 10/26/2013  . Anxiety 10/26/2013  . Acute blood loss anemia 09/05/2013  . Elbow  fracture, left 09/05/2013  . Left acetabular fracture ([place]) 09/05/2013  . Ankle fracture, left 09/05/2013  . HTN (hypertension) 09/03/2013  . HLD (hyperlipidemia) 09/03/2013  . Peripheral vascular disease, unspecified ([place]) 05/10/2012  . Chronic total occlusion of artery of the extremities ([place]) 01/19/2012    Past Surgical History:  Procedure Laterality Date  . ABDOMINAL AORTAGRAM N/A 12/26/2014   Procedure: ABDOMINAL Maxcine Ham;  Surgeon: Serafina Mitchell, MD;  Location: [facility] CATH LAB;  Service: Cardiovascular;  Laterality: N/A;  . AMPUTATION Right 12/23/2016   Procedure: AMPUTATION BELOW KNEE;  Surgeon: Serafina Mitchell, MD;  Location: [place];  Service:  Vascular;  Laterality: Right;  . AORTOGRAM N/A 09/26/2016   Procedure: AORTOGRAM;  Surgeon: Waynetta Sandy, MD;  Location: [place] [facility];  Service: Vascular;  Laterality: N/A;  . CARPAL TUNNEL RELEASE Right   . CATARACT EXTRACTION W/ INTRAOCULAR LENS  IMPLANT, BILATERAL Bilateral   . COLONOSCOPY    . DILATION AND CURETTAGE OF UTERUS    . DRESSING CHANGE UNDER ANESTHESIA Right 01/16/2017   Procedure: DRESSING CHANGE RIGHT BELOW KNEE AMPUTATION;  Surgeon: Serafina Mitchell, MD;  Location: [place];  Service: Vascular;  Laterality: Right;  . EYE SURGERY Right    "macular OR"  . FEMORAL ARTERY STENT  12-11-10   Left SFA  . FEMORAL-POPLITEAL BYPASS GRAFT Left 09/24/2016   Procedure: REDO FEMORAL TO POPLITEAL ARTERY BYPASS GRAFT USING 6MM PROPATEN RINGED GORTEX GRAFT;  Surgeon: Serafina Mitchell, MD;  Location: [place];  Service: Vascular;  Laterality: Left;  . FEMORAL-TIBIAL BYPASS GRAFT Left 01/16/2017   Procedure: REDO BYPASS GRAFT FEMORAL-TIBIAL ARTERY WITH GORTEX GRAFT;  Surgeon: Serafina Mitchell, MD;  Location: [place];  Service: Vascular;  Laterality: Left;  AND LOWER LEG   . I & D EXTREMITY Right 12/17/2016   Procedure: IRRIGATION AND DEBRIDEMENT RIGHT FOOT;  Surgeon: Serafina Mitchell, MD;  Location: [place];  Service: Vascular;  Laterality: Right;  . INCISION AND DRAINAGE OF WOUND Left 10/25/2009   leg/notes 11/13/2009  . INSERTION OF ILIAC STENT Left 12/26/2014   Procedure: INSERTION OF ILIAC STENT;  Surgeon: Serafina Mitchell, MD;  Location: [facility] CATH LAB;  Service: Cardiovascular;  Laterality: Left;  . INSERTION OF ILIAC STENT Left 09/26/2016   Procedure: SUB INTIMAL INSERTION OF SUPERFICIAL FEMORAL ARTERY AND BELOW KNEE BYPASS GRAFT;  Surgeon: Waynetta Sandy, MD;  Location: [place];  Service: Vascular;  Laterality: Left;  . INSERTION OF ILIAC STENT Left 05/04/2020   Procedure: INSERTION OF SUPERFICIAL FEMORAL ARTERY BYPASS GRAFT STENT;  Surgeon: Serafina Mitchell, MD;  Location: [place];  Service:  Vascular;  Laterality: Left;  . JOINT REPLACEMENT     knee  . JOINT REPLACEMENT Left Oct. 17, 2014   Elbow ( pt fell 08-31-13 )  . LOWER EXTREMITY ANGIOGRAM Left 12/27/2019   Procedure: Intraoperative Left Lower Extremity Angiogram;  Surgeon: Angelia Mould, MD;  Location: [facility];  Service: Vascular;  Laterality: Left;  . LOWER EXTREMITY ANGIOGRAPHY N/A 09/21/2018   Procedure: LOWER EXTREMITY ANGIOGRAPHY;  Surgeon: Serafina Mitchell, MD;  Location: [place] CV LAB;  Service: Cardiovascular;  Laterality: N/A;  . ORIF SHOULDER FRACTURE Right    "it was crushed"  . PATCH ANGIOPLASTY Left 05/04/2020   Procedure: BALLOON  ANGIOPLASTY;  Surgeon: Serafina Mitchell, MD;  Location[facility] OR;  Service: Vascular;  Laterality: Left;  . PERIPHERAL VASCULAR CATHETERIZATION N/A 10/30/2015   Procedure: Abdominal Aortogram w/Lower Extremity;  Surgeon: Serafina Mitchell,  MD;  Location: [place] CV LAB;  Service: Cardiovascular;  Laterality: N/A;  . PERIPHERAL VASCULAR CATHETERIZATION  10/30/2015   Procedure: Peripheral Vascular Intervention;  Surgeon: Serafina Mitchell, MD;  Location: [place] CV LAB;  Service: Cardiovascular;;  . PERIPHERAL VASCULAR CATHETERIZATION N/A 04/01/2016   Procedure: Abdominal Aortogram w/Lower Extremity;  Surgeon: Serafina Mitchell, MD;  Location: [place] CV LAB;  Service: Cardiovascular;  Laterality: N/A;  . PERIPHERAL VASCULAR CATHETERIZATION Left 04/01/2016   Procedure: Peripheral Vascular Atherectomy;  Surgeon: Serafina Mitchell, MD;  Location: [place] CV LAB;  Service: Cardiovascular;  Laterality: Left;  Superficial femoral artery.  Marland Kitchen PERIPHERAL VASCULAR CATHETERIZATION Right 09/05/2016   "stent"  . PERIPHERAL VASCULAR CATHETERIZATION N/A 09/05/2016   Procedure: Abdominal Aortogram w/Lower Extremity;  Surgeon: Serafina Mitchell, MD;  Location: [place] CV LAB;  Service: Cardiovascular;  Laterality: N/A;  . PERIPHERAL VASCULAR CATHETERIZATION Right 09/05/2016    Procedure: Peripheral Vascular Intervention;  Surgeon: Serafina Mitchell, MD;  Location: [place] CV LAB;  Service: Cardiovascular;  Laterality: Right;  Superficial Femoral  . PERIPHERAL VASCULAR CATHETERIZATION Left 09/09/2016   Procedure: Lower Extremity Angiography;  Surgeon: Serafina Mitchell, MD;  Location: [place] CV LAB;  Service: Cardiovascular;  Laterality: Left;  . PR VEIN BYPASS GRAFT,AORTO-FEM-POP  09-13-09   Left Fem-pop  . THROMBECTOMY FEMORAL ARTERY Right 09/26/2016   Procedure: Thromboembolectomy Right Lower Extremity, Right Femoral Artery Endarterectomy with Patch Angioplasty; Right Lower Extremity Angiogram ;  Surgeon: Waynetta Sandy, MD;  Location: [facility];  Service: Vascular;  Laterality: Right;  . THROMBECTOMY FEMORAL ARTERY Left 12/27/2019   Procedure: Redo left femoral artery exposure, Thrombectomy of left deep femoral artery to anterior tibial artery bypass;  Surgeon: Angelia Mould, MD;  Location: [facility] OR;  Service: Vascular;  Laterality: Left;  . THROMBECTOMY FEMORAL ARTERY Left 05/04/2020   Procedure: THROMBECTOMY LEFT LEG;  Surgeon: Serafina Mitchell, MD;  Location: [place];  Service: Vascular;  Laterality: Left;  . TOTAL ELBOW ARTHROPLASTY Left 09/03/2013   Procedure: LEFT TOTAL ELBOW ARTHROPLASTY;  Surgeon: Roseanne Kaufman, MD;  Location: [place];  Service: Orthopedics;  Laterality: Left;  . TOTAL KNEE ARTHROPLASTY Left 06/2006  . TRANSMETATARSAL AMPUTATION Right 12/11/2016   Procedure: TRANSMETATARSAL AMPUTATION;  Surgeon: Serafina Mitchell, MD;  Location: [place];  Service: Vascular;  Laterality: Right;  . TUBAL LIGATION    . VAGINAL HYSTERECTOMY       OB History   No obstetric history on file.     Family History  Problem Relation Age of Onset  . Heart disease Father        Heart Disease before age 81  . Hyperlipidemia Father   . Hypertension Father   . Alcohol abuse Father   . Heart disease Brother   . Hyperlipidemia Brother   . Hypertension  Brother   . Deep vein thrombosis Brother   . Heart disease Son        Heart Disease before age 72  . Hyperlipidemia Son   . Hypertension Son   . Heart attack Son   . Diabetes Son   . Hypertension Son   . Hyperlipidemia Sister   . Hypertension Sister     Social History   Tobacco Use  . Smoking status: Former Smoker    Types: Cigarettes    Quit date: 11/17/1946    Years since quitting: 73.8  . Smokeless tobacco: Never Used  . Tobacco comment: "never smoked much"  Vaping Use  .  Vaping Use: Never used  Substance Use Topics  . Alcohol use: No    Alcohol/week: 0.0 standard drinks  . Drug use: No    Home Medications Prior to Admission medications   Medication Sig Start Date End Date Taking? Authorizing Provider  acetaminophen (TYLENOL) 500 MG tablet Take 1,000 mg by mouth every 8 (eight) hours as needed for mild pain.     [provider]  Alirocumab (PRALUENT) 75 MG/ML SOAJ Inject 1 pen into the skin every 14 (fourteen) days. 04/23/20   Belva Crome, MD  amiodarone (PACERONE) 200 MG tablet Take 1 tablet (200 mg total) by mouth daily. 06/12/20   Isaiah Serge, NP  ciprofloxacin (CIPRO) 500 MG tablet Take 1 tablet (500 mg total) by mouth 2 (two) times daily. One po bid x 7 days 08/22/20   Orpah Greek, MD  clopidogrel (PLAVIX) 75 MG tablet Take 1 tablet (75 mg total) by mouth daily. 01/28/17   Angiulli, Lavon Paganini, PA-C  Cyanocobalamin (VITAMIN B-12 PO) Place 1 tablet under the tongue daily.    [provider]  diazepam (VALIUM) 5 MG tablet Take 0.5 tablets (2.5 mg total) by mouth at bedtime as needed for anxiety. Patient taking differently: Take 2.5 mg by mouth at bedtime as needed (sleep).  01/28/17   Angiulli, Lavon Paganini, PA-C  furosemide (LASIX) 20 MG tablet Take 20 mg by mouth every Monday, Wednesday, and Friday.    [provider]  gabapentin (NEURONTIN) 300 MG capsule Take 300 mg by mouth See admin instructions. Take 300mg  by mouth three times  daily.  May take an additional 300mg  at bedtime as needed for pain.    [provider]  loperamide (IMODIUM) 2 MG capsule Take 2 mg by mouth See admin instructions. Take 2mg  by mouth as needed after each loose stool.  Do not exceed 8 capsules in 24 hours.    [provider]  metoprolol tartrate (LOPRESSOR) 25 MG tablet Take 1 tablet (25 mg total) by mouth at bedtime. 01/28/17   Angiulli, Lavon Paganini, PA-C  Multiple Vitamins-Minerals (IMMUNE SUPPORT VITAMIN C PO) Take 1 tablet by mouth 2 (two) times daily.     [provider]  Multiple Vitamins-Minerals (PRESERVISION AREDS PO) Take 1 capsule by mouth in the morning and at bedtime.    [provider]  oxycodone (OXY-IR) 5 MG capsule Take 1 capsule (5 mg total) by mouth every 6 (six) hours as needed for pain. 05/06/20   Dagoberto Ligas, PA-C  potassium chloride (K-DUR,KLOR-CON) 10 MEQ tablet Take 10 mEq by mouth daily.    [provider]  valsartan-hydrochlorothiazide (DIOVAN-HCT) 160-12.5 MG tablet Take 0.5 tablets by mouth daily. 01/28/17   Angiulli, Lavon Paganini, PA-C  VITAMIN D PO Take 1 capsule by mouth at bedtime.    [provider]    Allergies    Motrin [ibuprofen], Statins, Eliquis [apixaban], Morphine and related, Promethazine hcl, and Sulfa antibiotics  Review of Systems   Review of Systems  Constitutional: Negative for fever.  Genitourinary: Positive for dysuria and hematuria.  All other systems reviewed and are negative.   Physical Exam Updated Vital Signs BP (!) 182/70 (BP Location: Right Arm)   Pulse 69   Temp 98.2 F (36.8 C) (Oral)   Resp 18   Ht 5\' 2"  (1.575 m)   Wt 78 kg   SpO2 98%   BMI 31.46 kg/m   Physical Exam Vitals and nursing note reviewed.  Constitutional:  General: She is not in acute distress.    Appearance: Normal appearance. She is well-developed.  HENT:     Head: Normocephalic and atraumatic.     Right Ear: Hearing normal.     Left Ear: Hearing  normal.     Nose: Nose normal.  Eyes:     Conjunctiva/sclera: Conjunctivae normal.     Pupils: Pupils are equal, round, and reactive to light.  Cardiovascular:     Rate and Rhythm: Regular rhythm.     Heart sounds: S1 normal and S2 normal. No murmur heard.  No friction rub. No gallop.   Pulmonary:     Effort: Pulmonary effort is normal. No respiratory distress.     Breath sounds: Normal breath sounds.  Chest:     Chest wall: No tenderness.  Abdominal:     General: Bowel sounds are normal.     Palpations: Abdomen is soft.     Tenderness: There is no abdominal tenderness. There is no right CVA tenderness, left CVA tenderness, guarding or rebound. Negative signs include Murphy's sign and McBurney's sign.     Hernia: No hernia is present.  Musculoskeletal:        General: Normal range of motion.     Cervical back: Normal range of motion and neck supple.  Skin:    General: Skin is warm and dry.     Findings: No rash.  Neurological:     Mental Status: She is alert and oriented to person, place, and time.     GCS: GCS eye subscore is 4. GCS verbal subscore is 5. GCS motor subscore is 6.     Cranial Nerves: No cranial nerve deficit.     Sensory: No sensory deficit.     Coordination: Coordination normal.  Psychiatric:        Speech: Speech normal.        Behavior: Behavior normal.        Thought Content: Thought content normal.     ED Results / Procedures / Treatments   Labs (all labs ordered are listed, but only abnormal results are displayed) Labs Reviewed  URINALYSIS, ROUTINE W REFLEX MICROSCOPIC - Abnormal; Notable for the following components:      Result Value   APPearance CLOUDY (*)    Hgb urine dipstick LARGE (*)    Leukocytes,Ua LARGE (*)    All other components within normal limits  URINALYSIS, MICROSCOPIC (REFLEX) - Abnormal; Notable for the following components:   Bacteria, UA FEW (*)    All other components within normal limits  URINE CULTURE     EKG None  Radiology No results found.  Procedures Procedures (including critical care time)  Medications Ordered in ED Medications  ciprofloxacin (CIPRO) tablet 500 mg (has no administration in time range)    ED Course  I have reviewed the triage vital signs and the nursing notes.  Pertinent labs & imaging results that were available during my care of the patient were reviewed by me and considered in my medical decision making (see chart for details).    MDM Rules/Calculators/A&P                          Presents to the emergency department for evaluation of hematuria and dysuria that started earlier today.  Patient reports the last time she urinated, here in the ED, she did not see any blood but still has pressure in the bladder with urination.  Urinalysis does suggest infection.  Reviewing her records, most of her cultures have been multiple species but she did have a positive Pseudomonas in 2018.  This was sensitive to Cipro, will initiate Cipro and reculture.  Patient given return precautions.  She appears well today.  She is afebrile, does not appear ill or toxic.  Vital signs are normal other than hypertension which is known.  Final Clinical Impression(s) / ED Diagnoses Final diagnoses:  Hemorrhagic cystitis    Rx / DC Orders ED Discharge Orders         Ordered    ciprofloxacin (CIPRO) 500 MG tablet  2 times daily        08/22/20 2333           Orpah Greek, MD 08/22/20 2333

## 2020-08-24 LAB — URINE CULTURE: Culture: NO GROWTH

## 2020-08-27 ENCOUNTER — Encounter: Payer: Self-pay | Admitting: Surgery

## 2020-08-27 ENCOUNTER — Other Ambulatory Visit: Payer: Self-pay

## 2020-08-27 ENCOUNTER — Ambulatory Visit (HOSPITAL_COMMUNITY)
Admission: RE | Admit: 2020-08-27 | Discharge: 2020-08-27 | Disposition: A | Discharge status: Home / Self Care | Payer: Medicare Other | Source: Ambulatory Visit | Attending: Surgery | Admitting: Surgery

## 2020-08-27 ENCOUNTER — Ambulatory Visit (INDEPENDENT_AMBULATORY_CARE_PROVIDER_SITE_OTHER): Payer: Medicare Other | Admitting: Surgery

## 2020-08-27 VITALS — BP 159/84 | HR 68 | Temp 98.2°F | Resp 20

## 2020-08-27 DIAGNOSIS — I739 Peripheral vascular disease, unspecified: Secondary | ICD-10-CM

## 2020-08-27 DIAGNOSIS — I70213 Atherosclerosis of native arteries of extremities with intermittent claudication, bilateral legs: Secondary | ICD-10-CM | POA: Insufficient documentation

## 2020-08-27 NOTE — Progress Notes (Signed)
[facility]ist [facility] [place]  Patient name: [name] MRN: [id number] DOB: [birth year] Sex: female   REASON FOR VISIT:    Follow up  [place] ILLNESS:     [name] is a 86 y.o. female who has undergone multiple procedures on her left leg but came into the emergency department on 05/04/2020 with an ischemic left leg.  I took her to the operating room and performed thrombectomy of her left femoral to anterior tibial bypass graft.  I placed a Viabahn stent at the proximal anastomosis.  Balloon angioplasty of anterior tibial artery was performed.   She is back today for follow-up and has no new  PAST MEDICAL HISTORY:   Past Medical History:  Diagnosis Date  . Anemia   . Anginal pain ([place])   . Arthritis    "qwhere" (09/05/2016)  . Atrial fibrillation ([place]) 09/2016  . Chronic lower back pain   . Complication of anesthesia    "takes a long time for it to wear off; I can hallucinate if I take too much" (09/05/2016)  . DVT (deep venous thrombosis) ([place]) 10/2009  . Fall from steps 08/31/2013   Fx. pelvis, Left Hip, Left Elbow  . Fibromyalgia   . GERD (gastroesophageal reflux disease)    09/22/16- "no too much anymore"  . GI bleed 10/24/2016  . High cholesterol   . History of blood transfusion   . History of hiatal hernia   . Hypertension   . Macular degeneration, wet ([place])    "started in right eye; now legally blind in that eye; now started in left eye but pretty much in control" (09/05/2016)  . Osteoporosis   . Peripheral vascular disease ([place])    nonviable tissue Right foot  . PONV (postoperative nausea and vomiting)   . Squamous cell carcinoma of skin of right calf Aug. 2015  . Stroke ([place])    TIA's no residual  . TIA (transient ischemic attack)    "several at once; none in a long time" (09/05/2016)     FAMILY HISTORY:   Family History  Problem Relation Age of Onset  . Heart disease Father          Heart Disease before age 89  . Hyperlipidemia Father   . Hypertension Father   . Alcohol abuse Father   . Heart disease Brother   . Hyperlipidemia Brother   . Hypertension Brother   . Deep vein thrombosis Brother   . Heart disease Son        Heart Disease before age 70  . Hyperlipidemia Son   . Hypertension Son   . Heart attack Son   . Diabetes Son   . Hypertension Son   . Hyperlipidemia Sister   . Hypertension Sister     SOCIAL HISTORY:   Social History   Tobacco Use  . Smoking status: Former Smoker    Types: Cigarettes    Quit date: 11/17/1946    Years since quitting: 73.8  . Smokeless tobacco: Never Used  . Tobacco comment: "never smoked much"  Substance Use Topics  . Alcohol use: No    Alcohol/week: 0.0 standard drinks     ALLERGIES:   Allergies  Allergen Reactions  . Motrin [Ibuprofen] Other (See Comments)    ADVERSE REACTION - GI BLEED  . Statins Other (See Comments)    ADVERSE REACTION MUSCLE PAIN & WEAKNESS  . Eliquis [Apixaban] Other (See Comments)    Lower GI bleeding  .  Morphine And Related Other (See Comments)    HALLUCINATIONS REACTION IS SIDE EFFECT  . Promethazine Hcl Other (See Comments)    IV  Drug only, makes her act crazy  . Sulfa Antibiotics Nausea And Vomiting     CURRENT MEDICATIONS:   Current Outpatient Medications  Medication Sig Dispense Refill  . acetaminophen (TYLENOL) 500 MG tablet Take 1,000 mg by mouth every 8 (eight) hours as needed for mild pain.     . Alirocumab (PRALUENT) 75 MG/ML SOAJ Inject 1 pen into the skin every 14 (fourteen) days. 2 pen 11  . amiodarone (PACERONE) 200 MG tablet Take 1 tablet (200 mg total) by mouth daily. 90 tablet 3  . ciprofloxacin (CIPRO) 500 MG tablet Take 1 tablet (500 mg total) by mouth 2 (two) times daily. One po bid x 7 days 14 tablet 0  . clopidogrel (PLAVIX) 75 MG tablet Take 1 tablet (75 mg total) by mouth daily. 30 tablet 1  . Cyanocobalamin (VITAMIN B-12 PO) Place 1 tablet  under the tongue daily.    . diazepam (VALIUM) 5 MG tablet Take 0.5 tablets (2.5 mg total) by mouth at bedtime as needed for anxiety. (Patient taking differently: Take 2.5 mg by mouth at bedtime as needed (sleep). ) 20 tablet 0  . furosemide (LASIX) 20 MG tablet Take 20 mg by mouth every Monday, Wednesday, and Friday.    . gabapentin (NEURONTIN) 300 MG capsule Take 300 mg by mouth See admin instructions. Take 300mg  by mouth three times daily.  May take an additional 300mg  at bedtime as needed for pain.    Marland Kitchen loperamide (IMODIUM) 2 MG capsule Take 2 mg by mouth See admin instructions. Take 2mg  by mouth as needed after each loose stool.  Do not exceed 8 capsules in 24 hours.    . metoprolol tartrate (LOPRESSOR) 25 MG tablet Take 1 tablet (25 mg total) by mouth at bedtime. 30 tablet 1  . Multiple Vitamins-Minerals (IMMUNE SUPPORT VITAMIN C PO) Take 1 tablet by mouth 2 (two) times daily.     . Multiple Vitamins-Minerals (PRESERVISION AREDS PO) Take 1 capsule by mouth in the morning and at bedtime.    Marland Kitchen oxycodone (OXY-IR) 5 MG capsule Take 1 capsule (5 mg total) by mouth every 6 (six) hours as needed for pain. 15 capsule 0  . potassium chloride (K-DUR,KLOR-CON) 10 MEQ tablet Take 10 mEq by mouth daily.    . valsartan-hydrochlorothiazide (DIOVAN-HCT) 160-12.5 MG tablet Take 0.5 tablets by mouth daily. 30 tablet 0  . VITAMIN D PO Take 1 capsule by mouth at bedtime.     No current facility-administered medications for this visit.    REVIEW OF SYSTEMS:   [X]  denotes positive finding, [ ]  denotes negative finding Cardiac  Comments:  Chest pain or chest pressure:    Shortness of breath upon exertion:    Short of breath when lying flat:    Irregular heart rhythm:        Vascular    Pain in calf, thigh, or hip brought on by ambulation:    Pain in feet at night that wakes you up from your sleep:     Blood clot in your veins:    Leg swelling:         Pulmonary    Oxygen at home:    Productive cough:      Wheezing:         Neurologic    Sudden weakness in arms or legs:  Sudden numbness in arms or legs:     Sudden onset of difficulty speaking or slurred speech:    Temporary loss of vision in one eye:     Problems with dizziness:         Gastrointestinal    Blood in stool:     Vomited blood:         Genitourinary    Burning when urinating:     Blood in urine:        Psychiatric    Major depression:         Hematologic    Bleeding problems:    Problems with blood clotting too easily:        Skin    Rashes or ulcers:        Constitutional    Fever or chills:      PHYSICAL EXAM:   There were no vitals filed for this visit.  GENERAL: The patient is a well-nourished female, in no acute distress. The vital signs are documented above. CARDIAC: There is a regular rate and rhythm.  VASCULAR: I cannot palpate a dorsalis pedis pulse.  She has 2+ pitting edema to the left leg PULMONARY: Non-labored respirations MUSCULOSKELETAL: There are no major deformities or cyanosis. NEUROLOGIC: No focal weakness or paresthesias are detected. SKIN: There are no ulcers or rashes noted. PSYCHIATRIC: The patient has a normal affect.  STUDIES:   I have reviewed the following duplex: Left: Patent graft with mural thrombus mid to distal graft. No significant  stenosis.  MEDICAL ISSUES:   I discussed the ultrasound findings with the patient and her daughter.  There does appear to be mural thrombus within the midportion of the graft.  We really do not want to proceed with angiography as I am not sure treating this would have any impact on the longevity of her bypass graft.  I have her scheduled for follow-up evaluation in 3 months    Leia Alf, MD, FACS [facility] of [facility] [phone] Pager [phone]

## 2020-08-28 ENCOUNTER — Other Ambulatory Visit: Payer: Self-pay | Admitting: *Deleted

## 2020-08-28 DIAGNOSIS — I70213 Atherosclerosis of native arteries of extremities with intermittent claudication, bilateral legs: Secondary | ICD-10-CM

## 2020-08-28 DIAGNOSIS — I739 Peripheral vascular disease, unspecified: Secondary | ICD-10-CM

## 2020-08-30 DIAGNOSIS — Z23 Encounter for immunization: Secondary | ICD-10-CM | POA: Diagnosis not present

## 2020-09-12 DIAGNOSIS — E7800 Pure hypercholesterolemia, unspecified: Secondary | ICD-10-CM | POA: Diagnosis not present

## 2020-09-12 DIAGNOSIS — I10 Essential (primary) hypertension: Secondary | ICD-10-CM | POA: Diagnosis not present

## 2020-09-12 DIAGNOSIS — E782 Mixed hyperlipidemia: Secondary | ICD-10-CM | POA: Diagnosis not present

## 2020-09-12 DIAGNOSIS — M810 Age-related osteoporosis without current pathological fracture: Secondary | ICD-10-CM | POA: Diagnosis not present

## 2020-09-12 DIAGNOSIS — M169 Osteoarthritis of hip, unspecified: Secondary | ICD-10-CM | POA: Diagnosis not present

## 2020-09-12 DIAGNOSIS — M1990 Unspecified osteoarthritis, unspecified site: Secondary | ICD-10-CM | POA: Diagnosis not present

## 2020-10-03 DIAGNOSIS — Z79899 Other long term (current) drug therapy: Secondary | ICD-10-CM | POA: Diagnosis not present

## 2020-10-03 DIAGNOSIS — G4700 Insomnia, unspecified: Secondary | ICD-10-CM | POA: Diagnosis not present

## 2020-10-03 DIAGNOSIS — G629 Polyneuropathy, unspecified: Secondary | ICD-10-CM | POA: Diagnosis not present

## 2020-10-03 DIAGNOSIS — E559 Vitamin D deficiency, unspecified: Secondary | ICD-10-CM | POA: Diagnosis not present

## 2020-10-03 DIAGNOSIS — D81818 Other biotin-dependent carboxylase deficiency: Secondary | ICD-10-CM | POA: Diagnosis not present

## 2020-10-03 DIAGNOSIS — Z89511 Acquired absence of right leg below knee: Secondary | ICD-10-CM | POA: Diagnosis not present

## 2020-10-03 DIAGNOSIS — I10 Essential (primary) hypertension: Secondary | ICD-10-CM | POA: Diagnosis not present

## 2020-10-03 DIAGNOSIS — M810 Age-related osteoporosis without current pathological fracture: Secondary | ICD-10-CM | POA: Diagnosis not present

## 2020-10-03 DIAGNOSIS — I739 Peripheral vascular disease, unspecified: Secondary | ICD-10-CM | POA: Diagnosis not present

## 2020-10-03 DIAGNOSIS — M169 Osteoarthritis of hip, unspecified: Secondary | ICD-10-CM | POA: Diagnosis not present

## 2020-10-03 DIAGNOSIS — Z0001 Encounter for general adult medical examination with abnormal findings: Secondary | ICD-10-CM | POA: Diagnosis not present

## 2020-10-03 DIAGNOSIS — E7800 Pure hypercholesterolemia, unspecified: Secondary | ICD-10-CM | POA: Diagnosis not present

## 2020-10-08 DIAGNOSIS — I10 Essential (primary) hypertension: Secondary | ICD-10-CM | POA: Diagnosis not present

## 2020-10-08 DIAGNOSIS — M169 Osteoarthritis of hip, unspecified: Secondary | ICD-10-CM | POA: Diagnosis not present

## 2020-10-08 DIAGNOSIS — K219 Gastro-esophageal reflux disease without esophagitis: Secondary | ICD-10-CM | POA: Diagnosis not present

## 2020-10-08 DIAGNOSIS — E782 Mixed hyperlipidemia: Secondary | ICD-10-CM | POA: Diagnosis not present

## 2020-10-08 DIAGNOSIS — G4700 Insomnia, unspecified: Secondary | ICD-10-CM | POA: Diagnosis not present

## 2020-10-08 DIAGNOSIS — E7800 Pure hypercholesterolemia, unspecified: Secondary | ICD-10-CM | POA: Diagnosis not present

## 2020-10-08 DIAGNOSIS — M1990 Unspecified osteoarthritis, unspecified site: Secondary | ICD-10-CM | POA: Diagnosis not present

## 2020-10-08 DIAGNOSIS — E038 Other specified hypothyroidism: Secondary | ICD-10-CM | POA: Diagnosis not present

## 2020-10-08 DIAGNOSIS — M810 Age-related osteoporosis without current pathological fracture: Secondary | ICD-10-CM | POA: Diagnosis not present

## 2020-10-10 ENCOUNTER — Telehealth: Payer: Self-pay

## 2020-10-10 MED ORDER — PRALUENT 150 MG/ML ~~LOC~~ SOAJ: 150.0000 mg | SUBCUTANEOUS | 11 refills | Status: DC

## 2020-10-10 NOTE — Telephone Encounter (Signed)
Called and let pt know that reviewed lipid labs and that the provider would like to increase the praluent 75to praluent 150, rx sent, pt's daughter voiced understanding

## 2020-11-06 DIAGNOSIS — G4700 Insomnia, unspecified: Secondary | ICD-10-CM | POA: Diagnosis not present

## 2020-11-06 DIAGNOSIS — E7800 Pure hypercholesterolemia, unspecified: Secondary | ICD-10-CM | POA: Diagnosis not present

## 2020-11-06 DIAGNOSIS — E782 Mixed hyperlipidemia: Secondary | ICD-10-CM | POA: Diagnosis not present

## 2020-11-06 DIAGNOSIS — M169 Osteoarthritis of hip, unspecified: Secondary | ICD-10-CM | POA: Diagnosis not present

## 2020-11-06 DIAGNOSIS — I10 Essential (primary) hypertension: Secondary | ICD-10-CM | POA: Diagnosis not present

## 2020-11-06 DIAGNOSIS — K219 Gastro-esophageal reflux disease without esophagitis: Secondary | ICD-10-CM | POA: Diagnosis not present

## 2020-11-06 DIAGNOSIS — M1990 Unspecified osteoarthritis, unspecified site: Secondary | ICD-10-CM | POA: Diagnosis not present

## 2020-11-06 DIAGNOSIS — E038 Other specified hypothyroidism: Secondary | ICD-10-CM | POA: Diagnosis not present

## 2020-11-06 DIAGNOSIS — M810 Age-related osteoporosis without current pathological fracture: Secondary | ICD-10-CM | POA: Diagnosis not present

## 2020-12-07 DIAGNOSIS — E7800 Pure hypercholesterolemia, unspecified: Secondary | ICD-10-CM | POA: Diagnosis not present

## 2020-12-07 DIAGNOSIS — M810 Age-related osteoporosis without current pathological fracture: Secondary | ICD-10-CM | POA: Diagnosis not present

## 2020-12-07 DIAGNOSIS — I10 Essential (primary) hypertension: Secondary | ICD-10-CM | POA: Diagnosis not present

## 2020-12-07 DIAGNOSIS — E038 Other specified hypothyroidism: Secondary | ICD-10-CM | POA: Diagnosis not present

## 2020-12-07 DIAGNOSIS — G4700 Insomnia, unspecified: Secondary | ICD-10-CM | POA: Diagnosis not present

## 2020-12-07 DIAGNOSIS — E782 Mixed hyperlipidemia: Secondary | ICD-10-CM | POA: Diagnosis not present

## 2020-12-07 DIAGNOSIS — K219 Gastro-esophageal reflux disease without esophagitis: Secondary | ICD-10-CM | POA: Diagnosis not present

## 2020-12-07 DIAGNOSIS — M169 Osteoarthritis of hip, unspecified: Secondary | ICD-10-CM | POA: Diagnosis not present

## 2020-12-07 DIAGNOSIS — M1990 Unspecified osteoarthritis, unspecified site: Secondary | ICD-10-CM | POA: Diagnosis not present

## 2020-12-10 ENCOUNTER — Encounter: Payer: Self-pay | Admitting: Surgery

## 2020-12-10 ENCOUNTER — Ambulatory Visit (INDEPENDENT_AMBULATORY_CARE_PROVIDER_SITE_OTHER): Payer: Medicare Other | Admitting: Surgery

## 2020-12-10 ENCOUNTER — Ambulatory Visit (INDEPENDENT_AMBULATORY_CARE_PROVIDER_SITE_OTHER)
Admission: RE | Admit: 2020-12-10 | Discharge: 2020-12-10 | Disposition: A | Discharge status: Home / Self Care | Payer: Medicare Other | Source: Ambulatory Visit | Attending: Surgery | Admitting: Surgery

## 2020-12-10 ENCOUNTER — Ambulatory Visit (HOSPITAL_COMMUNITY)
Admission: RE | Admit: 2020-12-10 | Discharge: 2020-12-10 | Disposition: A | Discharge status: Home / Self Care | Payer: Medicare Other | Source: Ambulatory Visit | Attending: Surgery | Admitting: Surgery

## 2020-12-10 ENCOUNTER — Other Ambulatory Visit: Payer: Self-pay

## 2020-12-10 VITALS — BP 195/82 | HR 56 | Temp 97.6°F | Resp 14

## 2020-12-10 DIAGNOSIS — I70213 Atherosclerosis of native arteries of extremities with intermittent claudication, bilateral legs: Secondary | ICD-10-CM

## 2020-12-10 DIAGNOSIS — I739 Peripheral vascular disease, unspecified: Secondary | ICD-10-CM | POA: Insufficient documentation

## 2020-12-10 NOTE — Progress Notes (Signed)
[facility] and Vein Specialist of [place]  Patient name: [name] MRN: [id number] DOB: 07-31-1928 Sex: female   REASON FOR VISIT:    Follow up  [place] ILLNESS:    [name] is a 87 y.o. 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  She has undergone multiple attempts at limb salvage on the left leg, the most recent was on 05/04/2020.  A thrombectomy of her left femoral to anterior tibial bypass graft was performed and a Viabahn stent was placed within the proximal aspect of the femoral tibial bypass   PAST MEDICAL HISTORY:   Past Medical History:  Diagnosis Date  . Anemia   . Anginal pain ([place])   . Arthritis    "qwhere" (09/05/2016)  . Atrial fibrillation ([place]) 09/2016  . Chronic lower back pain   . Complication of anesthesia    "takes a long time for it to wear off; I can hallucinate if I take too much" (09/05/2016)  . DVT (deep venous thrombosis) ([place]) 10/2009  . Fall from steps 08/31/2013   Fx. pelvis, Left Hip, Left Elbow  . Fibromyalgia   . GERD (gastroesophageal reflux disease)    09/22/16- "no too much anymore"  . GI bleed 10/24/2016  . High cholesterol   . History of blood transfusion   . History of hiatal hernia   . Hypertension   . Macular degeneration, wet ([place])    "started in right eye; now legally blind in that eye; now started in left eye but pretty much in control" (09/05/2016)  . Osteoporosis   . Peripheral vascular disease ([place])    nonviable tissue Right foot  . PONV (postoperative nausea and vomiting)   . Squamous cell carcinoma of skin of right calf Aug. 2015  . Stroke ([place])    TIA's no residual  . TIA (transient ischemic attack)    "several at once; none in a long time" (09/05/2016)     FAMILY HISTORY:   Family History  Problem Relation Age of Onset  . Heart disease Father        Heart Disease before age 34  . Hyperlipidemia Father   . Hypertension  Father   . Alcohol abuse Father   . Heart disease Brother   . Hyperlipidemia Brother   . Hypertension Brother   . Deep vein thrombosis Brother   . Heart disease Son        Heart Disease before age 2  . Hyperlipidemia Son   . Hypertension Son   . Heart attack Son   . Diabetes Son   . Hypertension Son   . Hyperlipidemia Sister   . Hypertension Sister     SOCIAL HISTORY:   Social History   Tobacco Use  . Smoking status: Former Smoker    Types: Cigarettes    Quit date: 11/17/1946    Years since quitting: 74.1  . Smokeless tobacco: Never Used  . Tobacco comment: "never smoked much"  Substance Use Topics  . Alcohol use: No    Alcohol/week: 0.0 standard drinks     ALLERGIES:   Allergies  Allergen Reactions  . Motrin [Ibuprofen] Other (See Comments)    ADVERSE REACTION - GI BLEED  . Statins Other (See Comments)    ADVERSE REACTION MUSCLE PAIN & WEAKNESS  . Eliquis [Apixaban] Other (See Comments)    Lower GI bleeding  . Morphine And Related Other (See Comments)    HALLUCINATIONS REACTION IS SIDE EFFECT  . Promethazine  Hcl Other (See Comments)    IV  Drug only, makes her act crazy  . Sulfa Antibiotics Nausea And Vomiting     CURRENT MEDICATIONS:   Current Outpatient Medications  Medication Sig Dispense Refill  . acetaminophen (TYLENOL) 500 MG tablet Take 1,000 mg by mouth every 8 (eight) hours as needed for mild pain.     . Alirocumab (PRALUENT) 150 MG/ML SOAJ Inject 150 mg into the skin every 14 (fourteen) days. 2 mL 11  . amiodarone (PACERONE) 200 MG tablet Take 1 tablet (200 mg total) by mouth daily. 90 tablet 3  . ciprofloxacin (CIPRO) 500 MG tablet Take 1 tablet (500 mg total) by mouth 2 (two) times daily. One po bid x 7 days 14 tablet 0  . clopidogrel (PLAVIX) 75 MG tablet Take 1 tablet (75 mg total) by mouth daily. 30 tablet 1  . Cyanocobalamin (VITAMIN B-12 PO) Place 1 tablet under the tongue daily.    . diazepam (VALIUM) 5 MG tablet Take 0.5 tablets (2.5  mg total) by mouth at bedtime as needed for anxiety. (Patient taking differently: Take 2.5 mg by mouth at bedtime as needed (sleep).) 20 tablet 0  . furosemide (LASIX) 20 MG tablet Take 20 mg by mouth every Monday, Wednesday, and Friday.    . gabapentin (NEURONTIN) 300 MG capsule Take 300 mg by mouth See admin instructions. Take 300mg  by mouth three times daily.  May take an additional 300mg  at bedtime as needed for pain.    Marland Kitchen loperamide (IMODIUM) 2 MG capsule Take 2 mg by mouth See admin instructions. Take 2mg  by mouth as needed after each loose stool.  Do not exceed 8 capsules in 24 hours.    . metoprolol tartrate (LOPRESSOR) 25 MG tablet Take 1 tablet (25 mg total) by mouth at bedtime. 30 tablet 1  . Multiple Vitamins-Minerals (IMMUNE SUPPORT VITAMIN C PO) Take 1 tablet by mouth 2 (two) times daily.     . Multiple Vitamins-Minerals (PRESERVISION AREDS PO) Take 1 capsule by mouth in the morning and at bedtime.    Marland Kitchen oxycodone (OXY-IR) 5 MG capsule Take 1 capsule (5 mg total) by mouth every 6 (six) hours as needed for pain. 15 capsule 0  . potassium chloride (K-DUR,KLOR-CON) 10 MEQ tablet Take 10 mEq by mouth daily.    . valsartan-hydrochlorothiazide (DIOVAN-HCT) 160-12.5 MG tablet Take 0.5 tablets by mouth daily. 30 tablet 0  . VITAMIN D PO Take 1 capsule by mouth at bedtime.     No current facility-administered medications for this visit.    REVIEW OF SYSTEMS:   [X]  denotes positive finding, [ ]  denotes negative finding Cardiac  Comments:  Chest pain or chest pressure:    Shortness of breath upon exertion:    Short of breath when lying flat:    Irregular heart rhythm:        Vascular    Pain in calf, thigh, or hip brought on by ambulation:    Pain in feet at night that wakes you up from your sleep:     Blood clot in your veins:    Leg swelling:         Pulmonary    Oxygen at home:    Productive cough:     Wheezing:         Neurologic    Sudden weakness in arms or legs:      Sudden numbness in arms or legs:     Sudden onset of difficulty speaking or slurred speech:  Temporary loss of vision in one eye:     Problems with dizziness:         Gastrointestinal    Blood in stool:     Vomited blood:         Genitourinary    Burning when urinating:     Blood in urine:        Psychiatric    Major depression:         Hematologic    Bleeding problems:    Problems with blood clotting too easily:        Skin    Rashes or ulcers:        Constitutional    Fever or chills:      PHYSICAL EXAM:   Vitals:   12/10/20 1226 12/10/20 1234  BP: (!) 206/70 (!) 195/82  Pulse: (!) 57 (!) 56  Resp: 14   Temp: 97.6 F (36.4 C)   TempSrc: Temporal   SpO2: 97%     GENERAL: The patient is a well-nourished female, in no acute distress. The vital signs are documented above. CARDIAC: There is a regular rate and rhythm.  VASCULAR: The left foot is warm and well-perfused however there is no palpable pedal pulse PULMONARY: Non-labored respirations ABDOMEN: Soft and non-tender with normal pitched bowel sounds.  MUSCULOSKELETAL: There are no major deformities or cyanosis. NEUROLOGIC: No focal weakness or paresthesias are detected. SKIN: There are no ulcers or rashes noted. PSYCHIATRIC: The patient has a normal affect.  STUDIES:   I have reviewed her ultrasound with the following findings: +-------+-----------+-----------+------------+------------+  ABI/TBIToday's ABIToday's TBIPrevious ABIPrevious TBI  +-------+-----------+-----------+------------+------------+  Right BKA    -     BKA     -        +-------+-----------+-----------+------------+------------+  Left  0.70    0.25    0.82    -        +-------+-----------+-----------+------------+------------+  Left: Patent graft and stent with no visualized stenosis. Low flow noted   MEDICAL ISSUES:   I discussed with the patient and daughter that the velocity  profile in her bypass graft is decreasing which is concerning for possible bypass graft failure.  The patient states that she is feeling better than she has in a while and does not want an intervention at this time.  Therefore we agreed to reevaluate her bypass with ultrasound in 3 months.  If there continues to be a problem with her bypass, she will need to consider angiography    Leia Alf, MD, FACS [facility] [phone] Pager [phone]

## 2020-12-11 ENCOUNTER — Other Ambulatory Visit: Payer: Self-pay

## 2020-12-11 DIAGNOSIS — I739 Peripheral vascular disease, unspecified: Secondary | ICD-10-CM

## 2020-12-14 ENCOUNTER — Encounter: Payer: Self-pay | Admitting: Surgery

## 2021-01-03 ENCOUNTER — Telehealth: Payer: Self-pay

## 2021-01-03 NOTE — Telephone Encounter (Signed)
-----   Message from Ramond Dial, [place] sent at 01/02/2021  4:53 PM EST ----- Regarding: FW: lipids  ----- Message ----- From: Ramond Dial, RPH-CPP Sent: 01/02/2021  12:00 AM EST To: Ramond Dial, RPH-CPP Subject: FW: lipids                                     Recheck lipids ----- Message ----- From: Allean Found, CMA Sent: 10/10/2020  11:54 AM EST To: Ramond Dial, RPH-CPP Subject: lipids                                         141 ldl found on kpn from last week vast improvement from 212 but does she need the 150mg  instead of 75 for additional lowering? ----- Message ----- From: Ramond Dial, RPH-CPP Sent: 10/10/2020  11:08 AM EST To: Allean Found, CMA  Can you call pt and have her get labs. She is going to need for praluent renewal ----- Message ----- From: Loren Racer, RN Sent: 08/06/2020  10:43 AM EST To: Ramond Dial, RPH-CPP  Just an FYI, she no showed for her appt.   ----- Message ----- From: Ramond Dial, RPH-CPP Sent: 08/02/2020   8:42 AM EDT To: Loren Racer, RN  Hi Anderson Malta,  This pt is seeing Dr. Tamala Julian on Monday. She needs lipid labs. I have already ordered them, if you can just make sure patient goes to the lab after apt. Thanks!  Melissa ----- Message ----- From: Ramond Dial, RPH-CPP Sent: 08/02/2020 To: Ramond Dial, RPH-CPP  Seeing Dr. Tamala Julian on 9/20- needs lipid labs

## 2021-01-03 NOTE — Telephone Encounter (Signed)
Called to schedule lipid and hepatic panel but pt's daughter stated that she would like to schedule visit w/dr Tamala Julian all in the same day. Will route to chst scheduling pool.

## 2021-01-05 DIAGNOSIS — E038 Other specified hypothyroidism: Secondary | ICD-10-CM | POA: Diagnosis not present

## 2021-01-05 DIAGNOSIS — G4700 Insomnia, unspecified: Secondary | ICD-10-CM | POA: Diagnosis not present

## 2021-01-05 DIAGNOSIS — M810 Age-related osteoporosis without current pathological fracture: Secondary | ICD-10-CM | POA: Diagnosis not present

## 2021-01-05 DIAGNOSIS — E782 Mixed hyperlipidemia: Secondary | ICD-10-CM | POA: Diagnosis not present

## 2021-01-05 DIAGNOSIS — M1990 Unspecified osteoarthritis, unspecified site: Secondary | ICD-10-CM | POA: Diagnosis not present

## 2021-01-05 DIAGNOSIS — E7800 Pure hypercholesterolemia, unspecified: Secondary | ICD-10-CM | POA: Diagnosis not present

## 2021-01-05 DIAGNOSIS — M169 Osteoarthritis of hip, unspecified: Secondary | ICD-10-CM | POA: Diagnosis not present

## 2021-01-05 DIAGNOSIS — I10 Essential (primary) hypertension: Secondary | ICD-10-CM | POA: Diagnosis not present

## 2021-01-05 DIAGNOSIS — K219 Gastro-esophageal reflux disease without esophagitis: Secondary | ICD-10-CM | POA: Diagnosis not present

## 2021-01-24 ENCOUNTER — Encounter: Payer: Self-pay | Admitting: *Deleted

## 2021-01-24 ENCOUNTER — Other Ambulatory Visit: Payer: Self-pay

## 2021-01-24 ENCOUNTER — Encounter: Payer: Self-pay | Admitting: Interventional Cardiology

## 2021-01-24 ENCOUNTER — Other Ambulatory Visit: Payer: Medicare Other | Admitting: *Deleted

## 2021-01-24 ENCOUNTER — Ambulatory Visit (INDEPENDENT_AMBULATORY_CARE_PROVIDER_SITE_OTHER): Payer: Medicare Other | Admitting: Interventional Cardiology

## 2021-01-24 ENCOUNTER — Ambulatory Visit (INDEPENDENT_AMBULATORY_CARE_PROVIDER_SITE_OTHER): Payer: Medicare Other

## 2021-01-24 VITALS — BP 148/90 | HR 57 | Ht 62.0 in | Wt 134.0 lb

## 2021-01-24 DIAGNOSIS — I480 Paroxysmal atrial fibrillation: Secondary | ICD-10-CM

## 2021-01-24 DIAGNOSIS — R002 Palpitations: Secondary | ICD-10-CM | POA: Diagnosis not present

## 2021-01-24 DIAGNOSIS — E785 Hyperlipidemia, unspecified: Secondary | ICD-10-CM | POA: Diagnosis not present

## 2021-01-24 DIAGNOSIS — E782 Mixed hyperlipidemia: Secondary | ICD-10-CM | POA: Diagnosis not present

## 2021-01-24 DIAGNOSIS — Z7189 Other specified counseling: Secondary | ICD-10-CM

## 2021-01-24 DIAGNOSIS — I70213 Atherosclerosis of native arteries of extremities with intermittent claudication, bilateral legs: Secondary | ICD-10-CM

## 2021-01-24 LAB — LIPID PANEL
Chol/HDL Ratio: 5.3 ratio — ABNORMAL HIGH (ref 0.0–4.4)
Cholesterol, Total: 190 mg/dL (ref 100–199)
HDL: 36 mg/dL — ABNORMAL LOW (ref >39)
LDL Chol Calc (NIH): 98 mg/dL (ref 0–99)
Triglycerides: 335 mg/dL — ABNORMAL HIGH (ref 0–149)
VLDL Cholesterol Cal: 56 mg/dL — ABNORMAL HIGH (ref 5–40)

## 2021-01-24 LAB — HEPATIC FUNCTION PANEL
ALT: 11 IU/L (ref 0–32)
AST: 11 IU/L (ref 0–40)
Albumin: 4.1 g/dL (ref 3.5–4.6)
Alkaline Phosphatase: 95 IU/L (ref 44–121)
Bilirubin Total: 0.3 mg/dL (ref 0.0–1.2)
Bilirubin, Direct: <0.1 mg/dL (ref 0.00–0.40)
Total Protein: 7.2 g/dL (ref 6.0–8.5)

## 2021-01-24 NOTE — Progress Notes (Signed)
Cardiology Office Note:    Date:  01/24/2021   ID:  [name], DOB Oct 04, 1928, MRN [id number]  PCP:  Josetta Huddle, MD  Cardiologist:  Sinclair Grooms, MD   Referring MD: Josetta Huddle, MD   Chief Complaint  Patient presents with  . Coronary Artery Disease  . Atrial Fibrillation  . Follow-up    Amiodarone    History of Present Illness:    [name] is a 87 y.o. female with a hx of paroxysmal atrial fibrillation, HTN, HLD, PVD s/p right BKA in 2018, Fem-pop bypass of LLE, who presented with LLE ischemia now s/p LLE thrombectomy of her graft, CVA/TIA, and GERDwho presents for [facility] upin [facility].  She has occasional palpitations that takes her breath away.  These are very short-lived instances.  It has been going on now for greater than a month and increasing in frequency.  Each individual episode is momentary.  She has had no prolonged heart racing.  She denies chest pain.  She denies dyspnea.  She does not feel she is having atrial fibrillation.  She is on amiodarone to suppress recurrence of atrial fib.  She is unable to use DOAC/warfarin therapy because it precipitates rectal bleeding that requires transfusion.  Amiodarone has been successful at decreasing the burden of atrial fib.  She has severe bilateral lower extremity arterial insufficiency and is status post right BKA.  She has significant obstructive disease in the left lower extremity that is being followed closely by Dr. [name].  Past Medical History:  Diagnosis Date  . Anemia   . Anginal pain ([place])   . Arthritis    "qwhere" (09/05/2016)  . Atrial fibrillation ([place]) 09/2016  . Chronic lower back pain   . Complication of anesthesia    "takes a long time for it to wear off; I can hallucinate if I take too much" (09/05/2016)  . DVT (deep venous thrombosis) ([place]) 10/2009  . Fall from steps 08/31/2013   Fx. pelvis, Left Hip, Left Elbow  . Fibromyalgia   .  GERD (gastroesophageal reflux disease)    09/22/16- "no too much anymore"  . GI bleed 10/24/2016  . High cholesterol   . History of blood transfusion   . History of hiatal hernia   . Hypertension   . Macular degeneration, wet ([place])    "started in right eye; now legally blind in that eye; now started in left eye but pretty much in control" (09/05/2016)  . Osteoporosis   . Peripheral vascular disease ([place])    nonviable tissue Right foot  . PONV (postoperative nausea and vomiting)   . Squamous cell carcinoma of skin of right calf Aug. 2015  . Stroke ([place])    TIA's no residual  . TIA (transient ischemic attack)    "several at once; none in a long time" (09/05/2016)    Past Surgical History:  Procedure Laterality Date  . ABDOMINAL AORTAGRAM N/A 12/26/2014   Procedure: ABDOMINAL Maxcine Ham;  Surgeon: Serafina Mitchell, MD;  Location: [facility] CATH LAB;  Service: Cardiovascular;  Laterality: N/A;  . AMPUTATION Right 12/23/2016   Procedure: AMPUTATION BELOW KNEE;  Surgeon: Serafina Mitchell, MD;  Location: [place];  Service: Vascular;  Laterality: Right;  . AORTOGRAM N/A 09/26/2016   Procedure: AORTOGRAM;  Surgeon: Waynetta Sandy, MD;  Location: [place];  Service: Vascular;  Laterality: N/A;  . CARPAL TUNNEL RELEASE Right   . CATARACT EXTRACTION W/ INTRAOCULAR LENS  IMPLANT,  BILATERAL Bilateral   . COLONOSCOPY    . DILATION AND CURETTAGE OF UTERUS    . DRESSING CHANGE UNDER ANESTHESIA Right 01/16/2017   Procedure: DRESSING CHANGE RIGHT BELOW KNEE AMPUTATION;  Surgeon: Serafina Mitchell, MD;  Location: [place];  Service: Vascular;  Laterality: Right;  . EYE SURGERY Right    "macular OR"  . FEMORAL ARTERY STENT  12-11-10   Left SFA  . FEMORAL-POPLITEAL BYPASS GRAFT Left 09/24/2016   Procedure: REDO FEMORAL TO POPLITEAL ARTERY BYPASS GRAFT USING 6MM PROPATEN RINGED GORTEX GRAFT;  Surgeon: Serafina Mitchell, MD;  Location: [place];  Service: Vascular;  Laterality: Left;  . FEMORAL-TIBIAL BYPASS GRAFT Left  01/16/2017   Procedure: REDO BYPASS GRAFT FEMORAL-TIBIAL ARTERY WITH GORTEX GRAFT;  Surgeon: Serafina Mitchell, MD;  Location: [place];  Service: Vascular;  Laterality: Left;  AND LOWER LEG   . I & D EXTREMITY Right 12/17/2016   Procedure: IRRIGATION AND DEBRIDEMENT RIGHT FOOT;  Surgeon: Serafina Mitchell, MD;  Location: [place];  Service: Vascular;  Laterality: Right;  . INCISION AND DRAINAGE OF WOUND Left 10/25/2009   leg/notes 11/13/2009  . INSERTION OF ILIAC STENT Left 12/26/2014   Procedure: INSERTION OF ILIAC STENT;  Surgeon: Serafina Mitchell, MD;  Location: [facility] CATH LAB;  Service: Cardiovascular;  Laterality: Left;  . INSERTION OF ILIAC STENT Left 09/26/2016   Procedure: SUB INTIMAL INSERTION OF SUPERFICIAL FEMORAL ARTERY AND BELOW KNEE BYPASS GRAFT;  Surgeon: Waynetta Sandy, MD;  Location: [place];  Service: Vascular;  Laterality: Left;  . INSERTION OF ILIAC STENT Left 05/04/2020   Procedure: INSERTION OF SUPERFICIAL FEMORAL ARTERY BYPASS GRAFT STENT;  Surgeon: Serafina Mitchell, MD;  Location: [place];  Service: Vascular;  Laterality: Left;  . JOINT REPLACEMENT     knee  . JOINT REPLACEMENT Left Oct. 17, 2014   Elbow ( pt fell 08-31-13 )  . LOWER EXTREMITY ANGIOGRAM Left 12/27/2019   Procedure: Intraoperative Left Lower Extremity Angiogram;  Surgeon: Angelia Mould, MD;  Location: [place];  Service: Vascular;  Laterality: Left;  . LOWER EXTREMITY ANGIOGRAPHY N/A 09/21/2018   Procedure: LOWER EXTREMITY ANGIOGRAPHY;  Surgeon: Serafina Mitchell, MD;  Location: [place] CV LAB;  Service: Cardiovascular;  Laterality: N/A;  . ORIF SHOULDER FRACTURE Right    "it was crushed"  . PATCH ANGIOPLASTY Left 05/04/2020   Procedure: BALLOON  ANGIOPLASTY;  Surgeon: Serafina Mitchell, MD;  Location: [facility] OR;  Service: Vascular;  Laterality: Left;  . PERIPHERAL VASCULAR CATHETERIZATION N/A 10/30/2015   Procedure: Abdominal Aortogram w/Lower Extremity;  Surgeon: Serafina Mitchell, MD;  Location: [place] CV  LAB;  Service: Cardiovascular;  Laterality: N/A;  . PERIPHERAL VASCULAR CATHETERIZATION  10/30/2015   Procedure: Peripheral Vascular Intervention;  Surgeon: Serafina Mitchell, MD;  Location: [place] CV LAB;  Service: Cardiovascular;;  . PERIPHERAL VASCULAR CATHETERIZATION N/A 04/01/2016   Procedure: Abdominal Aortogram w/Lower Extremity;  Surgeon: Serafina Mitchell, MD;  Location: [place] CV LAB;  Service: Cardiovascular;  Laterality: N/A;  . PERIPHERAL VASCULAR CATHETERIZATION Left 04/01/2016   Procedure: Peripheral Vascular Atherectomy;  Surgeon: Serafina Mitchell, MD;  Location: [place] CV LAB;  Service: Cardiovascular;  Laterality: Left;  Superficial femoral artery.  Marland Kitchen PERIPHERAL VASCULAR CATHETERIZATION Right 09/05/2016   "stent"  . PERIPHERAL VASCULAR CATHETERIZATION N/A 09/05/2016   Procedure: Abdominal Aortogram w/Lower Extremity;  Surgeon: Serafina Mitchell, MD;  Location: [place] CV LAB;  Service: Cardiovascular;  Laterality: N/A;  .  PERIPHERAL VASCULAR CATHETERIZATION Right 09/05/2016   Procedure: Peripheral Vascular Intervention;  Surgeon: Serafina Mitchell, MD;  Location: [place] CV LAB;  Service: Cardiovascular;  Laterality: Right;  Superficial Femoral  . PERIPHERAL VASCULAR CATHETERIZATION Left 09/09/2016   Procedure: Lower Extremity Angiography;  Surgeon: Serafina Mitchell, MD;  Location: [place] CV LAB;  Service: Cardiovascular;  Laterality: Left;  . PR VEIN BYPASS GRAFT,AORTO-FEM-POP  09-13-09   Left Fem-pop  . THROMBECTOMY FEMORAL ARTERY Right 09/26/2016   Procedure: Thromboembolectomy Right Lower Extremity, Right Femoral Artery Endarterectomy with Patch Angioplasty; Right Lower Extremity Angiogram ;  Surgeon: Waynetta Sandy, MD;  Location: [place];  Service: Vascular;  Laterality: Right;  . THROMBECTOMY FEMORAL ARTERY Left 12/27/2019   Procedure: Redo left femoral artery exposure, Thrombectomy of left deep femoral artery to anterior tibial artery bypass;   Surgeon: Angelia Mould, MD;  [facility] OR;  Service: Vascular;  Laterality: Left;  . THROMBECTOMY FEMORAL ARTERY Left 05/04/2020   Procedure: THROMBECTOMY LEFT LEG;  Surgeon: Serafina Mitchell, MD;  Location: [place];  Service: Vascular;  Laterality: Left;  . TOTAL ELBOW ARTHROPLASTY Left 09/03/2013   Procedure: LEFT TOTAL ELBOW ARTHROPLASTY;  Surgeon: Roseanne Kaufman, MD;  Location: [place];  Service: Orthopedics;  Laterality: Left;  . TOTAL KNEE ARTHROPLASTY Left 06/2006  . TRANSMETATARSAL AMPUTATION Right 12/11/2016   Procedure: TRANSMETATARSAL AMPUTATION;  Surgeon: Serafina Mitchell, MD;  Location: [place];  Service: Vascular;  Laterality: Right;  . TUBAL LIGATION    . VAGINAL HYSTERECTOMY      Current Medications: Current Meds  Medication Sig  . acetaminophen (TYLENOL) 500 MG tablet Take 1,000 mg by mouth every 8 (eight) hours as needed for mild pain.   . Alirocumab (PRALUENT) 150 MG/ML SOAJ Inject 150 mg into the skin every 14 (fourteen) days.  Marland Kitchen amiodarone (PACERONE) 200 MG tablet Take 1 tablet (200 mg total) by mouth daily.  . ciprofloxacin (CIPRO) 500 MG tablet Take 1 tablet (500 mg total) by mouth 2 (two) times daily. One po bid x 7 days  . clopidogrel (PLAVIX) 75 MG tablet Take 1 tablet (75 mg total) by mouth daily.  . Cyanocobalamin (VITAMIN B-12 PO) Place 1 tablet under the tongue daily.  . diazepam (VALIUM) 5 MG tablet Take 0.5 tablets (2.5 mg total) by mouth at bedtime as needed for anxiety. (Patient taking differently: Take 2.5 mg by mouth at bedtime as needed (sleep).)  . furosemide (LASIX) 20 MG tablet Take 20 mg by mouth every Monday, Wednesday, and Friday.  . gabapentin (NEURONTIN) 300 MG capsule Take 300 mg by mouth See admin instructions. Take 300mg  by mouth three times daily.  May take an additional 300mg  at bedtime as needed for pain.  Marland Kitchen loperamide (IMODIUM) 2 MG capsule Take 2 mg by mouth See admin instructions. Take 2mg  by mouth as needed after each loose stool.  Do  not exceed 8 capsules in 24 hours.  . metoprolol tartrate (LOPRESSOR) 25 MG tablet Take 1 tablet (25 mg total) by mouth at bedtime.  . Multiple Vitamins-Minerals (IMMUNE SUPPORT VITAMIN C PO) Take 1 tablet by mouth 2 (two) times daily.   . Multiple Vitamins-Minerals (PRESERVISION AREDS PO) Take 1 capsule by mouth in the morning and at bedtime.  Marland Kitchen oxycodone (OXY-IR) 5 MG capsule Take 1 capsule (5 mg total) by mouth every 6 (six) hours as needed for pain.  . potassium chloride (K-DUR,KLOR-CON) 10 MEQ tablet Take 10 mEq by mouth daily.  . valsartan-hydrochlorothiazide (DIOVAN-HCT) 160-12.5  MG tablet Take 0.5 tablets by mouth daily.  Marland Kitchen VITAMIN D PO Take 1 capsule by mouth at bedtime.     Allergies:   Motrin [ibuprofen], Statins, Eliquis [apixaban], Morphine and related, Promethazine hcl, and Sulfa antibiotics   Social History   Socioeconomic History  . Marital status: Widowed    Spouse name: Not on file  . Number of children: Not on file  . Years of education: Not on file  . Highest education level: Not on file  Occupational History  . Not on file  Tobacco Use  . Smoking status: Former Smoker    Types: Cigarettes    Quit date: 11/17/1946    Years since quitting: 74.2  . Smokeless tobacco: Never Used  . Tobacco comment: "never smoked much"  Vaping Use  . Vaping Use: Never used  Substance and Sexual Activity  . Alcohol use: No    Alcohol/week: 0.0 standard drinks  . Drug use: No  . Sexual activity: Not on file  Other Topics Concern  . Not on file  Social History Narrative  . Not on file   Social Determinants of Health   Financial Resource Strain: Not on file  Food Insecurity: Not on file  Transportation Needs: Not on file  Physical Activity: Not on file  Stress: Not on file  Social Connections: Not on file     Family History: The patient's family history includes Alcohol abuse in her father; Deep vein thrombosis in her brother; Diabetes in her son; Heart attack in her son;  Heart disease in her brother, father, and son; Hyperlipidemia in her brother, father, sister, and son; Hypertension in her brother, father, sister, son, and son.  ROS:   Please see the history of present illness.    No neurological symptoms.  She gets atrial fibrillation when she has general anesthesia and vascular surgery.  Amiodarone was started to control rhythm and is done so.  Never had a spontaneous episode of atrial fibrillation outside of the surgical procedure.  All other systems reviewed and are negative.  EKGs/Labs/Other Studies Reviewed:    The following studies were reviewed today: No new imaging data  EKG:  EKG sinus bradycardia, nonspecific ST-T wave abnormality.  Compared to prior, heart rate is slightly slower.  Recent Labs: 05/05/2020: ALT 9; BUN 31; Creatinine, Ser 1.60; Potassium 4.5; Sodium 136 05/09/2020: Hemoglobin 7.5; Platelets 218  Recent Lipid Panel    Component Value Date/Time   CHOL 338 (H) 04/20/2020 1116   TRIG 420 (H) 04/20/2020 1116   HDL 37 (L) 04/20/2020 1116   CHOLHDL 9.1 (H) 04/20/2020 1116   CHOLHDL 6.5 02/11/2009 1850   VLDL 68 (H) 02/11/2009 1850   LDLCALC 212 (H) 04/20/2020 1116   LDLDIRECT 226 (H) 04/20/2020 1116    Physical Exam:    VS:  BP (!) 148/90   Pulse (!) 57   Ht 5\' 2"  (1.575 m)   Wt 134 lb (60.8 kg)   SpO2 97%   BMI 24.51 kg/m     Wt Readings from Last 3 Encounters:  01/24/21 134 lb (60.8 kg)  08/22/20 172 lb (78 kg)  05/30/20 138 lb (62.6 kg)     GEN: Elderly and frail. No acute distress HEENT: Normal NECK: No JVD. LYMPHATICS: No lymphadenopathy CARDIAC: No murmur. RRR no gallop, or edema. VASCULAR:  Normal Pulses. No bruits. RESPIRATORY:  Clear to auscultation without rales, wheezing or rhonchi  ABDOMEN: Soft, non-tender, non-distended, No pulsatile mass, MUSCULOSKELETAL: No deformity  SKIN: Warm and  dry NEUROLOGIC:  Alert and oriented x 3 PSYCHIATRIC:  Normal affect   ASSESSMENT:    1. Palpitations   2.  Paroxysmal atrial fibrillation (HCC)   3. Atherosclerosis of native artery of both lower extremities with intermittent claudication ([place])   4. Mixed hyperlipidemia   5. Educated about COVID-19 virus infection    PLAN:    In order of problems listed above:  1. 7-day monitor  2. 7-day monitor to evaluate current complaint of palpitations.  Continue amiodarone 200 mg/day.  I believe the current complaints will likely represent PACs or PVCs.  I hope to decrease amiodarone dose to 100 mg/day.  If we know she is going to have elective or emergency surgery, we should increase the dose of amiodarone even intravenously if surgery is acute or orally to give protection against recurrence.  Overall plan though would be to maintain her in a stable state on the lowest dose of amiodarone possible.  She is not a candidate for long-term DOAC or warfarin therapy because of instances of significant rectal bleeding when anticoagulated. 3. Secondary prevention.  She is on Praluent.  Most recent lipid panel in November 2021 was LDL of 141 (down from 212 in June 2021) on Praluent therapy. 4. Continue Praluent.  Adverse reaction to statins. 5. Vaccinated, boosted, and practicing mitigation.  If monitor does not demonstrate atrial fibrillation, decrease amiodarone to 100 mg/day.  Medication Adjustments/Labs and Tests Ordered: Current medicines are reviewed at length with the patient today.  Concerns regarding medicines are outlined above.  Orders Placed This Encounter  Procedures  . LONG TERM MONITOR (3-14 DAYS)  . EKG 12-Lead   No orders of the defined types were placed in this encounter.   Patient Instructions  Medication Instructions:  Your physician recommends that you continue on your current medications as directed. Please refer to the Current Medication list given to you today.  *If you need a refill on your cardiac medications before your next appointment, please call your pharmacy*   Lab  Work: None If you have labs (blood work) drawn today and your tests are completely normal, you will receive your results only by: Marland Kitchen MyChart Message (if you have MyChart) OR . A paper copy in the mail If you have any lab test that is abnormal or we need to change your treatment, we will call you to review the results.   Testing/Procedures: Your physician recommends that you wear a monitor for 7 days.   Follow-Up: At [facility], you and your health needs are our priority.  As part of our continuing mission to provide you with exceptional heart care, we have created designated Provider Care Teams.  These Care Teams include your primary Cardiologist (physician) and Advanced Practice Providers (APPs -  Physician Assistants and Nurse Practitioners) who all work together to provide you with the care you need, when you need it.  We recommend signing up for the patient portal called "MyChart".  Sign up information is provided on this After Visit Summary.  MyChart is used to connect with patients for Virtual Visits (Telemedicine).  Patients are able to view lab/test results, encounter notes, upcoming appointments, etc.  Non-urgent messages can be sent to your provider as well.   To learn more about what you can do with MyChart, go to [email or web address].    Your next appointment:   1 year(s)  The format for your next appointment:   In Person  Provider:   You may see Belva Crome III,  MD or one of the following Advanced Practice Providers on your designated Care Team:    Kathyrn Drown, NP    Other Instructions  ZIO XT- Long Term Monitor Instructions   Your physician has requested you wear your ZIO patch monitor 7 days.   This is a single patch monitor.  [employer] supplies one patch monitor per enrollment.  Additional stickers are not available.   Please do not apply patch if you will be having a Nuclear Stress Test, Echocardiogram, Cardiac CT, MRI, or Chest Xray during the time  frame you would be wearing the monitor. The patch cannot be worn during these tests.  You cannot remove and re-apply the ZIO XT patch monitor.   Your ZIO patch monitor will be sent USPS Priority mail from [facility] directly to your home address. The monitor may also be mailed to a PO BOX if home delivery is not available.   It may take 3-5 days to receive your monitor after you have been enrolled.   Once you have received you monitor, please review enclosed instructions.  Your monitor has already been registered assigning a specific monitor serial # to you.   Applying the monitor   Shave hair from upper left chest.   Hold abrader disc by orange tab.  Rub abrader in 40 strokes over left upper chest as indicated in your monitor instructions.   Clean area with 4 enclosed alcohol pads .  Use all pads to assure are is cleaned thoroughly.  Let dry.   Apply patch as indicated in monitor instructions.  Patch will be place under collarbone on left side of chest with arrow pointing upward.   Rub patch adhesive wings for 2 minutes.Remove white label marked "1".  Remove white label marked "2".  Rub patch adhesive wings for 2 additional minutes.   While looking in a mirror, press and release button in center of patch.  A small green light will flash 3-4 times .  This will be your only indicator the monitor has been turned on.     Do not shower for the first 24 hours.  You may shower after the first 24 hours.   Press button if you feel a symptom. You will hear a small click.  Record Date, Time and Symptom in the Patient Log Book.   When you are ready to remove patch, follow instructions on last 2 pages of Patient Log Book.  Stick patch monitor onto last page of Patient Log Book.   Place Patient Log Book in [place] box.  Use locking tab on box and tape box closed securely.  The [employer] has [employer] on it.  Please place in mailbox as soon as possible.  Your physician should have  your test results approximately 7 days after the monitor has been mailed back to [facility].   Call [place] at [phone] if you have questions regarding your ZIO XT patch monitor.  Call them immediately if you see an orange light blinking on your monitor.   If your monitor falls off in less than 4 days contact our Monitor department at [phone].  If your monitor becomes loose or falls off after 4 days call [employer] at [phone] for suggestions on securing your monitor.       Signed, Sinclair Grooms, MD  01/24/2021 11:10 AM    [place]

## 2021-01-24 NOTE — Patient Instructions (Signed)
Medication Instructions:  Your physician recommends that you continue on your current medications as directed. Please refer to the Current Medication list given to you today.  *If you need a refill on your cardiac medications before your next appointment, please call your pharmacy*   Lab Work: None If you have labs (blood work) drawn today and your tests are completely normal, you will receive your results only by: [facility] MyChart Message (if you have MyChart) OR . A paper copy in the mail If you have any lab test that is abnormal or we need to change your treatment, we will call you to review the results.   Testing/Procedures: Your physician recommends that you wear a monitor for 7 days.   Follow-Up: At [facility], you and your health needs are our priority.  As part of our continuing mission to provide you with exceptional heart care, we have created designated Provider Care Teams.  These Care Teams include your primary Cardiologist (physician) and Advanced Practice Providers (APPs -  Physician Assistants and Nurse Practitioners) who all work together to provide you with the care you need, when you need it.  We recommend signing up for the patient portal called "MyChart".  Sign up information is provided on this After Visit Summary.  MyChart is used to connect with patients for Virtual Visits (Telemedicine).  Patients are able to view lab/test results, encounter notes, upcoming appointments, etc.  Non-urgent messages can be sent to your provider as well.   To learn more about what you can do with MyChart, go to [email or web address].    Your next appointment:   1 year(s)  The format for your next appointment:   In Person  Provider:   You may see Sinclair Grooms, MD or one of the following Advanced Practice Providers on your designated Care Team:    Kathyrn Drown, NP    Other Instructions  [place] Monitor Instructions   Your physician has requested you wear your  ZIO patch monitor 7 days.   This is a single patch monitor.  [employer] supplies one patch monitor per enrollment.  Additional stickers are not available.   Please do not apply patch if you will be having a Nuclear Stress Test, Echocardiogram, Cardiac CT, MRI, or Chest Xray during the time frame you would be wearing the monitor. The patch cannot be worn during these tests.  You cannot remove and re-apply the ZIO XT patch monitor.   Your ZIO patch monitor will be sent USPS Priority mail from [facility] directly to your home address. The monitor may also be mailed to a PO BOX if home delivery is not available.   It may take 3-5 days to receive your monitor after you have been enrolled.   Once you have received you monitor, please review enclosed instructions.  Your monitor has already been registered assigning a specific monitor serial # to you.   Applying the monitor   Shave hair from upper left chest.   Hold abrader disc by orange tab.  Rub abrader in 40 strokes over left upper chest as indicated in your monitor instructions.   Clean area with 4 enclosed alcohol pads .  Use all pads to assure are is cleaned thoroughly.  Let dry.   Apply patch as indicated in monitor instructions.  Patch will be place under collarbone on left side of chest with arrow pointing upward.   Rub patch adhesive wings for 2 minutes.Remove white label marked "1".  Remove  white label marked "2".  Rub patch adhesive wings for 2 additional minutes.   While looking in a mirror, press and release button in center of patch.  A small green light will flash 3-4 times .  This will be your only indicator the monitor has been turned on.     Do not shower for the first 24 hours.  You may shower after the first 24 hours.   Press button if you feel a symptom. You will hear a small click.  Record Date, Time and Symptom in the Patient Log Book.   When you are ready to remove patch, follow instructions on last 2 pages of  Patient Log Book.  Stick patch monitor onto last page of Patient Log Book.   Place Patient Log Book in Leaf box.  Use locking tab on box and tape box closed securely.  The [employer] and [employer] has [employer] on it.  Please place in mailbox as soon as possible.  Your physician should have your test results approximately 7 days after the monitor has been mailed back to [facility].   Call [place] at [phone] if you have questions regarding your ZIO XT patch monitor.  Call them immediately if you see an orange light blinking on your monitor.   If your monitor falls off in less than 4 days contact our Monitor department at [phone].  If your monitor becomes loose or falls off after 4 days call [employer] at [phone] for suggestions on securing your monitor.

## 2021-01-24 NOTE — Progress Notes (Signed)
Patient ID: [name], female   DOB: 1928/09/29, 87 y.o.   MRN: [id number] Patient enrolled for [employer] to ship a 7 day ZIO XT long term holter monitor to her home.

## 2021-01-25 ENCOUNTER — Telehealth: Payer: Self-pay | Admitting: Pharmacist

## 2021-01-25 NOTE — Telephone Encounter (Signed)
LDL down from 141 to 98 after increasing Praluent. Baseline is 212 so good improvement. TG improved, but still high. Pt not interested in changing diet at 87 years old. Intolerant to statins and zetia. Not sure Vascepa or Nexletol would be a good cost to benefit ratio at this point. Continue Praluent 150mg  q 14 days. Relayed results to patient's daughter [name] (per [place])

## 2021-01-28 DIAGNOSIS — I480 Paroxysmal atrial fibrillation: Secondary | ICD-10-CM

## 2021-02-06 DIAGNOSIS — K219 Gastro-esophageal reflux disease without esophagitis: Secondary | ICD-10-CM | POA: Diagnosis not present

## 2021-02-06 DIAGNOSIS — E038 Other specified hypothyroidism: Secondary | ICD-10-CM | POA: Diagnosis not present

## 2021-02-06 DIAGNOSIS — G4700 Insomnia, unspecified: Secondary | ICD-10-CM | POA: Diagnosis not present

## 2021-02-06 DIAGNOSIS — E782 Mixed hyperlipidemia: Secondary | ICD-10-CM | POA: Diagnosis not present

## 2021-02-06 DIAGNOSIS — E7800 Pure hypercholesterolemia, unspecified: Secondary | ICD-10-CM | POA: Diagnosis not present

## 2021-02-06 DIAGNOSIS — I10 Essential (primary) hypertension: Secondary | ICD-10-CM | POA: Diagnosis not present

## 2021-02-13 DIAGNOSIS — I480 Paroxysmal atrial fibrillation: Secondary | ICD-10-CM | POA: Diagnosis not present

## 2021-03-11 ENCOUNTER — Encounter: Payer: Self-pay | Admitting: Surgery

## 2021-03-11 ENCOUNTER — Ambulatory Visit (INDEPENDENT_AMBULATORY_CARE_PROVIDER_SITE_OTHER): Payer: Medicare Other | Admitting: Surgery

## 2021-03-11 ENCOUNTER — Ambulatory Visit (INDEPENDENT_AMBULATORY_CARE_PROVIDER_SITE_OTHER)
Admission: RE | Admit: 2021-03-11 | Discharge: 2021-03-11 | Disposition: A | Discharge status: Home / Self Care | Payer: Medicare Other | Source: Ambulatory Visit | Attending: Surgery | Admitting: Surgery

## 2021-03-11 ENCOUNTER — Ambulatory Visit (HOSPITAL_COMMUNITY)
Admission: RE | Admit: 2021-03-11 | Discharge: 2021-03-11 | Disposition: A | Discharge status: Home / Self Care | Payer: Medicare Other | Source: Ambulatory Visit | Attending: Surgery | Admitting: Surgery

## 2021-03-11 ENCOUNTER — Other Ambulatory Visit: Payer: Self-pay

## 2021-03-11 VITALS — BP 153/77 | HR 54 | Temp 97.7°F | Resp 16 | Ht 62.0 in | Wt 134.0 lb

## 2021-03-11 DIAGNOSIS — G4700 Insomnia, unspecified: Secondary | ICD-10-CM | POA: Diagnosis not present

## 2021-03-11 DIAGNOSIS — I739 Peripheral vascular disease, unspecified: Secondary | ICD-10-CM | POA: Diagnosis not present

## 2021-03-11 DIAGNOSIS — I70213 Atherosclerosis of native arteries of extremities with intermittent claudication, bilateral legs: Secondary | ICD-10-CM

## 2021-03-11 DIAGNOSIS — E782 Mixed hyperlipidemia: Secondary | ICD-10-CM | POA: Diagnosis not present

## 2021-03-11 DIAGNOSIS — M810 Age-related osteoporosis without current pathological fracture: Secondary | ICD-10-CM | POA: Diagnosis not present

## 2021-03-11 DIAGNOSIS — I10 Essential (primary) hypertension: Secondary | ICD-10-CM | POA: Diagnosis not present

## 2021-03-11 DIAGNOSIS — E038 Other specified hypothyroidism: Secondary | ICD-10-CM | POA: Diagnosis not present

## 2021-03-11 DIAGNOSIS — K219 Gastro-esophageal reflux disease without esophagitis: Secondary | ICD-10-CM | POA: Diagnosis not present

## 2021-03-11 DIAGNOSIS — M1990 Unspecified osteoarthritis, unspecified site: Secondary | ICD-10-CM | POA: Diagnosis not present

## 2021-03-11 NOTE — Progress Notes (Signed)
[facility] [place]  Patient name: [name] MRN: [id number] DOB: 16-May-1928 Sex: female   REASON FOR VISIT:    Follow up  [place] ILLNESS:    [name] is a 87 y.o. 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  She has undergone multiple attempts at limb salvage on the left leg, the most recent was on 05/04/2020.  A thrombectomy of her left femoral to anterior tibial bypass graft was performed and a Viabahn stent was placed within the proximal aspect of the femoral tibial bypass.  At her last visit, the velocity profile in her bypass graft was concerning for possible bypass graft failure.  She was feeling better than she had in a while and did not want intervention at that time and so we agreed for 9-month surveillance.  She has no new complaints today   PAST MEDICAL HISTORY:   Past Medical History:  Diagnosis Date  . Anemia   . Anginal pain ([place])   . Arthritis    "qwhere" (09/05/2016)  . Atrial fibrillation ([place]) 09/2016  . Chronic lower back pain   . Complication of anesthesia    "takes a long time for it to wear off; I can hallucinate if I take too much" (09/05/2016)  . DVT (deep venous thrombosis) ([place]) 10/2009  . Fall from steps 08/31/2013   Fx. pelvis, Left Hip, Left Elbow  . Fibromyalgia   . GERD (gastroesophageal reflux disease)    09/22/16- "no too much anymore"  . GI bleed 10/24/2016  . High cholesterol   . History of blood transfusion   . History of hiatal hernia   . Hypertension   . Macular degeneration, wet ([place])    "started in right eye; now legally blind in that eye; now started in left eye but pretty much in control" (09/05/2016)  . Osteoporosis   . Peripheral vascular disease ([place])    nonviable tissue Right foot  . PONV (postoperative nausea and vomiting)   . Squamous cell carcinoma of skin of right calf Aug. 2015  . Stroke ([employer] [place])    TIA's no  residual  . TIA (transient ischemic attack)    "several at once; none in a long time" (09/05/2016)     FAMILY HISTORY:   Family History  Problem Relation Age of Onset  . Heart disease Father        Heart Disease before age 81  . Hyperlipidemia Father   . Hypertension Father   . Alcohol abuse Father   . Heart disease Brother   . Hyperlipidemia Brother   . Hypertension Brother   . Deep vein thrombosis Brother   . Heart disease Son        Heart Disease before age 42  . Hyperlipidemia Son   . Hypertension Son   . Heart attack Son   . Diabetes Son   . Hypertension Son   . Hyperlipidemia Sister   . Hypertension Sister     SOCIAL HISTORY:   Social History   Tobacco Use  . Smoking status: Former Smoker    Types: Cigarettes    Quit date: 11/17/1946    Years since quitting: 74.3  . Smokeless tobacco: Never Used  . Tobacco comment: "never smoked much"  Substance Use Topics  . Alcohol use: No    Alcohol/week: 0.0 standard drinks     ALLERGIES:   Allergies  Allergen Reactions  . Motrin [Ibuprofen] Other (See Comments)  ADVERSE REACTION - GI BLEED  . Statins Other (See Comments)    ADVERSE REACTION MUSCLE PAIN & WEAKNESS  . Eliquis [Apixaban] Other (See Comments)    Lower GI bleeding  . Morphine And Related Other (See Comments)    HALLUCINATIONS REACTION IS SIDE EFFECT  . Promethazine Hcl Other (See Comments)    IV  Drug only, makes her act crazy  . Sulfa Antibiotics Nausea And Vomiting     CURRENT MEDICATIONS:   Current Outpatient Medications  Medication Sig Dispense Refill  . acetaminophen (TYLENOL) 500 MG tablet Take 1,000 mg by mouth every 8 (eight) hours as needed for mild pain.     . Alirocumab (PRALUENT) 150 MG/ML SOAJ Inject 150 mg into the skin every 14 (fourteen) days. 2 mL 11  . amiodarone (PACERONE) 200 MG tablet Take 1 tablet (200 mg total) by mouth daily. 90 tablet 3  . ciprofloxacin (CIPRO) 500 MG tablet Take 1 tablet (500 mg total) by mouth  2 (two) times daily. One po bid x 7 days 14 tablet 0  . clopidogrel (PLAVIX) 75 MG tablet Take 1 tablet (75 mg total) by mouth daily. 30 tablet 1  . Cyanocobalamin (VITAMIN B-12 PO) Place 1 tablet under the tongue daily.    . diazepam (VALIUM) 5 MG tablet Take 0.5 tablets (2.5 mg total) by mouth at bedtime as needed for anxiety. (Patient taking differently: Take 2.5 mg by mouth at bedtime as needed (sleep).) 20 tablet 0  . furosemide (LASIX) 20 MG tablet Take 20 mg by mouth every Monday, Wednesday, and Friday.    . gabapentin (NEURONTIN) 300 MG capsule Take 300 mg by mouth See admin instructions. Take 300mg  by mouth three times daily.  May take an additional 300mg  at bedtime as needed for pain.    Marland Kitchen loperamide (IMODIUM) 2 MG capsule Take 2 mg by mouth See admin instructions. Take 2mg  by mouth as needed after each loose stool.  Do not exceed 8 capsules in 24 hours.    . metoprolol tartrate (LOPRESSOR) 25 MG tablet Take 1 tablet (25 mg total) by mouth at bedtime. 30 tablet 1  . Multiple Vitamins-Minerals (IMMUNE SUPPORT VITAMIN C PO) Take 1 tablet by mouth 2 (two) times daily.     . Multiple Vitamins-Minerals (PRESERVISION AREDS PO) Take 1 capsule by mouth in the morning and at bedtime.    Marland Kitchen oxycodone (OXY-IR) 5 MG capsule Take 1 capsule (5 mg total) by mouth every 6 (six) hours as needed for pain. 15 capsule 0  . potassium chloride (K-DUR,KLOR-CON) 10 MEQ tablet Take 10 mEq by mouth daily.    . valsartan-hydrochlorothiazide (DIOVAN-HCT) 160-12.5 MG tablet Take 0.5 tablets by mouth daily. 30 tablet 0  . VITAMIN D PO Take 1 capsule by mouth at bedtime.     No current facility-administered medications for this visit.    REVIEW OF SYSTEMS:   [X]  denotes positive finding, [ ]  denotes negative finding Cardiac  Comments:  Chest pain or chest pressure:    Shortness of breath upon exertion:    Short of breath when lying flat:    Irregular heart rhythm:        Vascular    Pain in calf, thigh, or hip  brought on by ambulation:    Pain in feet at night that wakes you up from your sleep:     Blood clot in your veins:    Leg swelling:         Pulmonary    Oxygen  at home:    Productive cough:     Wheezing:         Neurologic    Sudden weakness in arms or legs:     Sudden numbness in arms or legs:     Sudden onset of difficulty speaking or slurred speech:    Temporary loss of vision in one eye:     Problems with dizziness:         Gastrointestinal    Blood in stool:     Vomited blood:         Genitourinary    Burning when urinating:     Blood in urine:        Psychiatric    Major depression:         Hematologic    Bleeding problems:    Problems with blood clotting too easily:        Skin    Rashes or ulcers:        Constitutional    Fever or chills:      PHYSICAL EXAM:   Vitals:   03/11/21 1357  BP: (!) 153/77  Pulse: (!) 54  Resp: 16  Temp: 97.7 F (36.5 C)  SpO2: 97%  Weight: 134 lb (60.8 kg)  Height: 5\' 2"  (1.575 m)    GENERAL: The patient is a well-nourished female, in no acute distress. The vital signs are documented above. CARDIAC: There is a regular rate and rhythm.  VASCULAR: I cannot palpate a left pedal pulse PULMONARY: Non-labored respirations MUSCULOSKELETAL: There are no major deformities or cyanosis. NEUROLOGIC: No focal weakness or paresthesias are detected. SKIN: There are no ulcers or rashes noted. PSYCHIATRIC: The patient has a normal affect.  STUDIES:   I have reviewed the following duplex studies:  +-------+-----------+-----------+------------+------------+  ABI/TBIToday's ABIToday's TBIPrevious ABIPrevious TBI  +-------+-----------+-----------+------------+------------+  Right BKA    BKA    BKA     BKA       +-------+-----------+-----------+------------+------------+  Left  0.46    0.22    0.70    0.25      +-------+-----------+-----------+------------+------------+    increased velocity in proximal segment of the graft (just before the  proximal end of the stent).     Summary:  Left: Patent femoral to anterior tibial artery graft with details in chart  above.  MEDICAL ISSUES:   We went over the ultrasound today again.  Velocity profile indicates a threatened bypass.  On reviewing the images, it appears that the anterior tibial distal to the anastomosis is diseased and the bypass is staying open by retrograde flow through the proximal anterior tibial artery.  We discussed continuing with our current therapy which is just observation.  I have her scheduled for follow-up in 6 months.  They understand that she is at risk for bypass graft failure and if that occurs, we may have difficulty with limb salvage.  All other questions were answered today.  I will see her back in [place], IV, MD, FACS [facility] [phone] Pager [phone]

## 2021-03-15 DIAGNOSIS — Z20822 Contact with and (suspected) exposure to covid-19: Secondary | ICD-10-CM | POA: Diagnosis not present

## 2021-03-20 ENCOUNTER — Other Ambulatory Visit: Payer: Self-pay

## 2021-03-20 DIAGNOSIS — I739 Peripheral vascular disease, unspecified: Secondary | ICD-10-CM

## 2021-03-21 DIAGNOSIS — Z20822 Contact with and (suspected) exposure to covid-19: Secondary | ICD-10-CM | POA: Diagnosis not present

## 2021-03-27 DIAGNOSIS — Z20822 Contact with and (suspected) exposure to covid-19: Secondary | ICD-10-CM | POA: Diagnosis not present

## 2021-04-09 DIAGNOSIS — E782 Mixed hyperlipidemia: Secondary | ICD-10-CM | POA: Diagnosis not present

## 2021-04-09 DIAGNOSIS — I10 Essential (primary) hypertension: Secondary | ICD-10-CM | POA: Diagnosis not present

## 2021-04-09 DIAGNOSIS — K219 Gastro-esophageal reflux disease without esophagitis: Secondary | ICD-10-CM | POA: Diagnosis not present

## 2021-04-09 DIAGNOSIS — M1990 Unspecified osteoarthritis, unspecified site: Secondary | ICD-10-CM | POA: Diagnosis not present

## 2021-04-09 DIAGNOSIS — M810 Age-related osteoporosis without current pathological fracture: Secondary | ICD-10-CM | POA: Diagnosis not present

## 2021-04-09 DIAGNOSIS — E7800 Pure hypercholesterolemia, unspecified: Secondary | ICD-10-CM | POA: Diagnosis not present

## 2021-04-09 DIAGNOSIS — G4700 Insomnia, unspecified: Secondary | ICD-10-CM | POA: Diagnosis not present

## 2021-04-09 DIAGNOSIS — M169 Osteoarthritis of hip, unspecified: Secondary | ICD-10-CM | POA: Diagnosis not present

## 2021-04-09 DIAGNOSIS — E038 Other specified hypothyroidism: Secondary | ICD-10-CM | POA: Diagnosis not present

## 2021-05-03 DIAGNOSIS — E782 Mixed hyperlipidemia: Secondary | ICD-10-CM | POA: Diagnosis not present

## 2021-05-03 DIAGNOSIS — G4700 Insomnia, unspecified: Secondary | ICD-10-CM | POA: Diagnosis not present

## 2021-05-03 DIAGNOSIS — M1990 Unspecified osteoarthritis, unspecified site: Secondary | ICD-10-CM | POA: Diagnosis not present

## 2021-05-03 DIAGNOSIS — I10 Essential (primary) hypertension: Secondary | ICD-10-CM | POA: Diagnosis not present

## 2021-05-03 DIAGNOSIS — E038 Other specified hypothyroidism: Secondary | ICD-10-CM | POA: Diagnosis not present

## 2021-05-03 DIAGNOSIS — K219 Gastro-esophageal reflux disease without esophagitis: Secondary | ICD-10-CM | POA: Diagnosis not present

## 2021-05-03 DIAGNOSIS — M169 Osteoarthritis of hip, unspecified: Secondary | ICD-10-CM | POA: Diagnosis not present

## 2021-05-03 DIAGNOSIS — E7800 Pure hypercholesterolemia, unspecified: Secondary | ICD-10-CM | POA: Diagnosis not present

## 2021-05-03 DIAGNOSIS — M810 Age-related osteoporosis without current pathological fracture: Secondary | ICD-10-CM | POA: Diagnosis not present

## 2021-06-03 DIAGNOSIS — M169 Osteoarthritis of hip, unspecified: Secondary | ICD-10-CM | POA: Diagnosis not present

## 2021-06-03 DIAGNOSIS — G4700 Insomnia, unspecified: Secondary | ICD-10-CM | POA: Diagnosis not present

## 2021-06-03 DIAGNOSIS — E782 Mixed hyperlipidemia: Secondary | ICD-10-CM | POA: Diagnosis not present

## 2021-06-03 DIAGNOSIS — I10 Essential (primary) hypertension: Secondary | ICD-10-CM | POA: Diagnosis not present

## 2021-06-03 DIAGNOSIS — M1990 Unspecified osteoarthritis, unspecified site: Secondary | ICD-10-CM | POA: Diagnosis not present

## 2021-06-03 DIAGNOSIS — M810 Age-related osteoporosis without current pathological fracture: Secondary | ICD-10-CM | POA: Diagnosis not present

## 2021-06-03 DIAGNOSIS — K219 Gastro-esophageal reflux disease without esophagitis: Secondary | ICD-10-CM | POA: Diagnosis not present

## 2021-06-03 DIAGNOSIS — E038 Other specified hypothyroidism: Secondary | ICD-10-CM | POA: Diagnosis not present

## 2021-06-03 DIAGNOSIS — E7800 Pure hypercholesterolemia, unspecified: Secondary | ICD-10-CM | POA: Diagnosis not present

## 2021-07-03 DIAGNOSIS — E782 Mixed hyperlipidemia: Secondary | ICD-10-CM | POA: Diagnosis not present

## 2021-07-03 DIAGNOSIS — E7800 Pure hypercholesterolemia, unspecified: Secondary | ICD-10-CM | POA: Diagnosis not present

## 2021-07-03 DIAGNOSIS — M810 Age-related osteoporosis without current pathological fracture: Secondary | ICD-10-CM | POA: Diagnosis not present

## 2021-07-03 DIAGNOSIS — G4700 Insomnia, unspecified: Secondary | ICD-10-CM | POA: Diagnosis not present

## 2021-07-03 DIAGNOSIS — I10 Essential (primary) hypertension: Secondary | ICD-10-CM | POA: Diagnosis not present

## 2021-07-03 DIAGNOSIS — E038 Other specified hypothyroidism: Secondary | ICD-10-CM | POA: Diagnosis not present

## 2021-07-03 DIAGNOSIS — M169 Osteoarthritis of hip, unspecified: Secondary | ICD-10-CM | POA: Diagnosis not present

## 2021-07-03 DIAGNOSIS — K219 Gastro-esophageal reflux disease without esophagitis: Secondary | ICD-10-CM | POA: Diagnosis not present

## 2021-08-05 DIAGNOSIS — G4700 Insomnia, unspecified: Secondary | ICD-10-CM | POA: Diagnosis not present

## 2021-08-05 DIAGNOSIS — M1990 Unspecified osteoarthritis, unspecified site: Secondary | ICD-10-CM | POA: Diagnosis not present

## 2021-08-05 DIAGNOSIS — E7800 Pure hypercholesterolemia, unspecified: Secondary | ICD-10-CM | POA: Diagnosis not present

## 2021-08-05 DIAGNOSIS — M169 Osteoarthritis of hip, unspecified: Secondary | ICD-10-CM | POA: Diagnosis not present

## 2021-08-05 DIAGNOSIS — E038 Other specified hypothyroidism: Secondary | ICD-10-CM | POA: Diagnosis not present

## 2021-08-05 DIAGNOSIS — I10 Essential (primary) hypertension: Secondary | ICD-10-CM | POA: Diagnosis not present

## 2021-08-05 DIAGNOSIS — E782 Mixed hyperlipidemia: Secondary | ICD-10-CM | POA: Diagnosis not present

## 2021-08-05 DIAGNOSIS — K219 Gastro-esophageal reflux disease without esophagitis: Secondary | ICD-10-CM | POA: Diagnosis not present

## 2021-08-05 DIAGNOSIS — M810 Age-related osteoporosis without current pathological fracture: Secondary | ICD-10-CM | POA: Diagnosis not present

## 2021-08-13 ENCOUNTER — Telehealth: Payer: Self-pay | Admitting: Interventional Cardiology

## 2021-08-13 NOTE — Telephone Encounter (Signed)
Pt c/o of Chest Pain: STAT if CP now or developed within 24 hours  1. Are you having CP right now? no  2. Are you experiencing any other symptoms (ex. SOB, nausea, vomiting, sweating)? sob  3. How long have you been experiencing CP? Gotten worse of the last week  4. Is your CP continuous or coming and going? Coming and going  5. Have you taken Nitroglycerin? No  Patient's daughter called to say that that the patient can hear her chest. Patient is also wheezing and extra tire  ?

## 2021-08-13 NOTE — Telephone Encounter (Signed)
Pt told daughter that she is having increased intermittent CP, wheezing and fatigue.  Fatigues easy with exertion.  Does have swelling in her one leg but daughter states it's hard to know if there is a difference in swelling because it is always significantly swollen.  No weights or vitals available as pt is in assisted living.  Daughter states last time they checked her weight, she believes she was up 2lbs.  Daughter mentioned that pt states she can "feel her heart beat in her chest" and states it doesn't feel regular.  Scheduled pt to come in tomorrow to see DOD, Dr. Gasper Sells for eval.  Daughter appreciative for call. Pt does have a hx of AF but currently not anticoagulated.

## 2021-08-14 ENCOUNTER — Other Ambulatory Visit: Payer: Self-pay

## 2021-08-14 ENCOUNTER — Ambulatory Visit
Admission: RE | Admit: 2021-08-14 | Discharge: 2021-08-14 | Disposition: A | Discharge status: Home / Self Care | Payer: Medicare Other | Source: Ambulatory Visit | Attending: Internal Medicine | Admitting: Internal Medicine

## 2021-08-14 ENCOUNTER — Ambulatory Visit (INDEPENDENT_AMBULATORY_CARE_PROVIDER_SITE_OTHER): Payer: Medicare Other | Admitting: Internal Medicine

## 2021-08-14 ENCOUNTER — Encounter: Payer: Self-pay | Admitting: Internal Medicine

## 2021-08-14 ENCOUNTER — Encounter (INDEPENDENT_AMBULATORY_CARE_PROVIDER_SITE_OTHER): Payer: Self-pay

## 2021-08-14 VITALS — BP 160/68 | HR 56 | Ht 62.0 in | Wt 132.0 lb

## 2021-08-14 DIAGNOSIS — R0602 Shortness of breath: Secondary | ICD-10-CM

## 2021-08-14 DIAGNOSIS — I480 Paroxysmal atrial fibrillation: Secondary | ICD-10-CM

## 2021-08-14 DIAGNOSIS — Z8673 Personal history of transient ischemic attack (TIA), and cerebral infarction without residual deficits: Secondary | ICD-10-CM | POA: Diagnosis not present

## 2021-08-14 DIAGNOSIS — Z5181 Encounter for therapeutic drug level monitoring: Secondary | ICD-10-CM | POA: Diagnosis not present

## 2021-08-14 DIAGNOSIS — I739 Peripheral vascular disease, unspecified: Secondary | ICD-10-CM | POA: Diagnosis not present

## 2021-08-14 DIAGNOSIS — I70422 Atherosclerosis of autologous vein bypass graft(s) of the extremities with rest pain, left leg: Secondary | ICD-10-CM | POA: Diagnosis not present

## 2021-08-14 DIAGNOSIS — R079 Chest pain, unspecified: Secondary | ICD-10-CM | POA: Insufficient documentation

## 2021-08-14 DIAGNOSIS — I10 Essential (primary) hypertension: Secondary | ICD-10-CM | POA: Diagnosis not present

## 2021-08-14 MED ORDER — ISOSORBIDE MONONITRATE ER 30 MG PO TB24
30.0000 mg | ORAL_TABLET | Freq: Every day | ORAL | 3 refills | Status: DC
Start: 2021-08-14 — End: 2022-07-23

## 2021-08-14 NOTE — Patient Instructions (Addendum)
Medication Instructions:  Your physician has recommended you make the following change in your medication:  START: isosorbide mononitrate (Imdur) 30 mg by mouth daily  *If you need a refill on your cardiac medications before your next appointment, please call your pharmacy*   Lab Work: NONE If you have labs (blood work) drawn today and your tests are completely normal, you will receive your results only by: [place] (if you have MyChart) OR A paper copy in the mail If you have any lab test that is abnormal or we need to change your treatment, we will call you to review the results.   Testing/Procedures: Your physician has requested that you have a chest x- ray Chest X-ray Instructions:    1. You may have this done at the [facility], located in the [place] on the 1st floor.    2. You do no have to have an appointment.    3. [place], [place] [address]        [phone]        Monday - Friday  8:00 am - 5:00 pm   Your physician has recommended that you have a pulmonary function test. Pulmonary Function Tests are a group of tests that measure how well air moves in and out of your lungs.   Follow-Up: At [facility], you and your health needs are our priority.  As part of our continuing mission to provide you with exceptional heart care, we have created designated Provider Care Teams.  These Care Teams include your primary Cardiologist (physician) and Advanced Practice Providers (APPs -  Physician Assistants and Nurse Practitioners) who all work together to provide you with the care you need, when you need it.  We recommend signing up for the patient portal called "MyChart".  Sign up information is provided on this After Visit Summary.  MyChart is used to connect with patients for Virtual Visits (Telemedicine).  Patients are able to view lab/test results, encounter notes, upcoming appointments, etc.  Non-urgent messages  can be sent to your provider as well.   To learn more about what you can do with MyChart, go to [email or web address].    Your next appointment:   4 month(s)  The format for your next appointment:   In Person  Provider:   You may see Sinclair Grooms, MD or one of the following Advanced Practice Providers on your designated Care Team:   Cecilie Kicks, NP

## 2021-08-14 NOTE — Progress Notes (Signed)
Cardiology Office Note:    Date:  08/14/2021   ID:  [name], DOB 1928/06/04, MRN [id number]  PCP:  Karen Huddle, MD   [name] Cardiologist:  Karen Grooms, MD     Referring MD: Karen Huddle, MD   DOD: CP  History of Present Illness:    [name] is a 87 y.o. female with a hx of PAF on amiodarone and no AC because of lower GI bleed, PAD s/p R BKA and L Fem POP, CVA with prior TIA and GERD presenting for DOD visit for CP 08/14/21.  Patient notes that she having some new chest pressure.  Since seeing Dr. Tamala Julian 01/24/21 daughter called in with intermittent CP, wheezing and fatigue.  Fatigues easy with exertion .  Has return of heart beat in her chest.  Has LE swelling.  Is also having audible stridor, but this is different.  This occurs when she is transferring.  Throat get raspy with prolonged talking.  When bending or twisting she has worsening wheezing. Never has smoking issues.   Does almost all her ADLs at her best.  Chest pressure occurs with transferring (her most active activity).  Notes DOE.  Notes weight gain of 2 lbs; has chronically had LE swelling.  Is at assisted living; but they do not take her blood pressure  Ambulatory blood pressure not done.   Past Medical History:  Diagnosis Date   Anemia    Anginal pain ([place])    Arthritis    "qwhere" (09/05/2016)   Atrial fibrillation ([place]) 09/2016   Chronic lower back pain    Complication of anesthesia    "takes a long time for it to wear off; I can hallucinate if I take too much" (09/05/2016)   DVT (deep venous thrombosis) ([place]) 10/2009   Fall from steps 08/31/2013   Fx. pelvis, Left Hip, Left Elbow   Fibromyalgia    GERD (gastroesophageal reflux disease)    09/22/16- "no too much anymore"   GI bleed 10/24/2016   High cholesterol    History of blood transfusion    History of hiatal hernia    Hypertension    Macular degeneration, wet ([place])    "started in right eye; now legally blind  in that eye; now started in left eye but pretty much in control" (09/05/2016)   Osteoporosis    Peripheral vascular disease ([place])    nonviable tissue Right foot   PONV (postoperative nausea and vomiting)    Squamous cell carcinoma of skin of right calf Aug. 2015   Stroke [facility])    TIA's no residual   TIA (transient ischemic attack)    "several at once; none in a long time" (09/05/2016)    Past Surgical History:  Procedure Laterality Date   ABDOMINAL AORTAGRAM N/A 12/26/2014   Procedure: ABDOMINAL Maxcine Ham;  Surgeon: Serafina Mitchell, MD;  [facility] LAB;  Service: Cardiovascular;  Laterality: N/A;   AMPUTATION Right 12/23/2016   Procedure: AMPUTATION BELOW KNEE;  Surgeon: Serafina Mitchell, MD;  Location: [place];  Service: Vascular;  Laterality: Right;   AORTOGRAM N/A 09/26/2016   Procedure: AORTOGRAM;  Surgeon: Waynetta Sandy, MD;  Location: [place];  Service: Vascular;  Laterality: N/A;   CARPAL TUNNEL RELEASE Right    CATARACT EXTRACTION W/ INTRAOCULAR LENS  IMPLANT, BILATERAL Bilateral    COLONOSCOPY     DILATION AND CURETTAGE OF UTERUS     DRESSING CHANGE UNDER ANESTHESIA Right 01/16/2017  Procedure: DRESSING CHANGE RIGHT BELOW KNEE AMPUTATION;  Surgeon: Serafina Mitchell, MD;  Location: [place];  Service: Vascular;  Laterality: Right;   EYE SURGERY Right    "macular OR"   FEMORAL ARTERY STENT  12-11-10   Left SFA   FEMORAL-POPLITEAL BYPASS GRAFT Left 09/24/2016   Procedure: REDO FEMORAL TO POPLITEAL ARTERY BYPASS GRAFT USING 6MM PROPATEN RINGED GORTEX GRAFT;  Surgeon: Serafina Mitchell, MD;  Location: [place];  Service: Vascular;  Laterality: Left;   FEMORAL-TIBIAL BYPASS GRAFT Left 01/16/2017   Procedure: REDO BYPASS GRAFT FEMORAL-TIBIAL ARTERY WITH GORTEX GRAFT;  Surgeon: Serafina Mitchell, MD;  Location: [place];  Service: Vascular;  Laterality: Left;  AND LOWER LEG    I & D EXTREMITY Right 12/17/2016   Procedure: IRRIGATION AND DEBRIDEMENT RIGHT FOOT;  Surgeon: Serafina Mitchell, MD;   Location: [place];  Service: Vascular;  Laterality: Right;   INCISION AND DRAINAGE OF WOUND Left 10/25/2009   leg/notes 11/13/2009   INSERTION OF ILIAC STENT Left 12/26/2014   Procedure: INSERTION OF ILIAC STENT;  Surgeon: Serafina Mitchell, MD;  Location: [place] CATH LAB;  Service: Cardiovascular;  Laterality: Left;   INSERTION OF ILIAC STENT Left 09/26/2016   Procedure: SUB INTIMAL INSERTION OF SUPERFICIAL FEMORAL ARTERY AND BELOW KNEE BYPASS GRAFT;  Surgeon: Waynetta Sandy, MD;  Location: [place];  Service: Vascular;  Laterality: Left;   INSERTION OF ILIAC STENT Left 05/04/2020   Procedure: INSERTION OF SUPERFICIAL FEMORAL ARTERY BYPASS GRAFT STENT;  Surgeon: Serafina Mitchell, MD;  Location: [place];  Service: Vascular;  Laterality: Left;   JOINT REPLACEMENT     knee   JOINT REPLACEMENT Left Oct. 17, 2014   Elbow ( pt fell 08-31-13 )   LOWER EXTREMITY ANGIOGRAM Left 12/27/2019   Procedure: Intraoperative Left Lower Extremity Angiogram;  Surgeon: Angelia Mould, MD;  Location: [place];  Service: Vascular;  Laterality: Left;   LOWER EXTREMITY ANGIOGRAPHY N/A 09/21/2018   Procedure: LOWER EXTREMITY ANGIOGRAPHY;  Surgeon: Serafina Mitchell, MD;  Location: [place] CV LAB;  Service: Cardiovascular;  Laterality: N/A;   ORIF SHOULDER FRACTURE Right    "it was crushed"   PATCH ANGIOPLASTY Left 05/04/2020   Procedure: BALLOON  ANGIOPLASTY;  Surgeon: Serafina Mitchell, MD;  Location[facility];  Service: Vascular;  Laterality: Left;   PERIPHERAL VASCULAR CATHETERIZATION N/A 10/30/2015   Procedure: Abdominal Aortogram w/Lower Extremity;  Surgeon: Serafina Mitchell, MD;  Location: [place] CV LAB;  Service: Cardiovascular;  Laterality: N/A;   PERIPHERAL VASCULAR CATHETERIZATION  10/30/2015   Procedure: Peripheral Vascular Intervention;  Surgeon: Serafina Mitchell, MD;  Location: [place] CV LAB;  Service: Cardiovascular;;   PERIPHERAL VASCULAR CATHETERIZATION N/A 04/01/2016   Procedure: Abdominal  Aortogram w/Lower Extremity;  Surgeon: Serafina Mitchell, MD;  Location: [place] CV LAB;  Service: Cardiovascular;  Laterality: N/A;   PERIPHERAL VASCULAR CATHETERIZATION Left 04/01/2016   Procedure: Peripheral Vascular Atherectomy;  Surgeon: Serafina Mitchell, MD;  Location: [place] CV LAB;  Service: Cardiovascular;  Laterality: Left;  Superficial femoral artery.   PERIPHERAL VASCULAR CATHETERIZATION Right 09/05/2016   "stent"   PERIPHERAL VASCULAR CATHETERIZATION N/A 09/05/2016   Procedure: Abdominal Aortogram w/Lower Extremity;  Surgeon: Serafina Mitchell, MD;  Location: [place] CV LAB;  Service: Cardiovascular;  Laterality: N/A;   PERIPHERAL VASCULAR CATHETERIZATION Right 09/05/2016   Procedure: Peripheral Vascular Intervention;  Surgeon: Serafina Mitchell, MD;  Location: [place] CV LAB;  Service: Cardiovascular;  Laterality: Right;  Superficial Femoral   PERIPHERAL VASCULAR CATHETERIZATION Left 09/09/2016   Procedure: Lower Extremity Angiography;  Surgeon: Serafina Mitchell, MD;  Location: [place] CV LAB;  Service: Cardiovascular;  Laterality: Left;   PR VEIN BYPASS GRAFT,AORTO-FEM-POP  09-13-09   Left Fem-pop   THROMBECTOMY FEMORAL ARTERY Right 09/26/2016   Procedure: Thromboembolectomy Right Lower Extremity, Right Femoral Artery Endarterectomy with Patch Angioplasty; Right Lower Extremity Angiogram ;  Surgeon: Waynetta Sandy, MD;  Location: [place];  Service: Vascular;  Laterality: Right;   THROMBECTOMY FEMORAL ARTERY Left 12/27/2019   Procedure: Redo left femoral artery exposure, Thrombectomy of left deep femoral artery to anterior tibial artery bypass;  Surgeon: Angelia Mould, MD;  Location: [facility] OR;  Service: Vascular;  Laterality: Left;   THROMBECTOMY FEMORAL ARTERY Left 05/04/2020   Procedure: THROMBECTOMY LEFT LEG;  Surgeon: Serafina Mitchell, MD;  Location: [facility] OR;  Service: Vascular;  Laterality: Left;   TOTAL ELBOW ARTHROPLASTY Left 09/03/2013   Procedure: LEFT  TOTAL ELBOW ARTHROPLASTY;  Surgeon: Roseanne Kaufman, MD;  Location: [place];  Service: Orthopedics;  Laterality: Left;   TOTAL KNEE ARTHROPLASTY Left 06/2006   TRANSMETATARSAL AMPUTATION Right 12/11/2016   Procedure: TRANSMETATARSAL AMPUTATION;  Surgeon: Serafina Mitchell, MD;  Location: [facility] OR;  Service: Vascular;  Laterality: Right;   TUBAL LIGATION     VAGINAL HYSTERECTOMY      Current Medications: Current Meds  Medication Sig   acetaminophen (TYLENOL) 500 MG tablet Take 1,000 mg by mouth every 8 (eight) hours as needed for mild pain.    Alirocumab (PRALUENT) 150 MG/ML SOAJ Inject 150 mg into the skin every 14 (fourteen) days.   amiodarone (PACERONE) 200 MG tablet Take 1 tablet (200 mg total) by mouth daily.   clopidogrel (PLAVIX) 75 MG tablet Take 1 tablet (75 mg total) by mouth daily.   Cyanocobalamin (VITAMIN B-12 PO) Place 1 tablet under the tongue daily.   diazepam (VALIUM) 5 MG tablet Take 0.5 tablets (2.5 mg total) by mouth at bedtime as needed for anxiety.   diphenoxylate-atropine (LOMOTIL) 2.5-0.025 MG tablet Take 1 tablet by mouth as needed for diarrhea or loose stools.   furosemide (LASIX) 20 MG tablet Take 20 mg by mouth every Monday, Wednesday, and Friday.   gabapentin (NEURONTIN) 300 MG capsule Take 300 mg by mouth See admin instructions. Take 300mg  by mouth three times daily.  May take an additional 300mg  at bedtime as needed for pain.   isosorbide mononitrate (IMDUR) 30 MG 24 hr tablet Take 1 tablet (30 mg total) by mouth daily.   levothyroxine (SYNTHROID) 50 MCG tablet Take 50 mcg by mouth every morning.   loperamide (IMODIUM) 2 MG capsule Take 2 mg by mouth See admin instructions. Take 2mg  by mouth as needed after each loose stool.  Do not exceed 8 capsules in 24 hours.   metoprolol tartrate (LOPRESSOR) 25 MG tablet Take 1 tablet (25 mg total) by mouth at bedtime.   Multiple Vitamins-Minerals (IMMUNE SUPPORT VITAMIN C PO) Take 1 tablet by mouth 2 (two) times daily.    Multiple  Vitamins-Minerals (PRESERVISION AREDS PO) Take 1 capsule by mouth in the morning and at bedtime.   oxycodone (OXY-IR) 5 MG capsule Take 1 capsule (5 mg total) by mouth every 6 (six) hours as needed for pain.   potassium chloride (K-DUR,KLOR-CON) 10 MEQ tablet Take 10 mEq by mouth daily.   valsartan-hydrochlorothiazide (DIOVAN-HCT) 160-12.5 MG tablet Take 0.5 tablets by mouth daily.   VITAMIN D PO  Take 1 capsule by mouth at bedtime.     Allergies:   Motrin [ibuprofen], Statins, Eliquis [apixaban], Morphine and related, Promethazine hcl, and Sulfa antibiotics   Social History   Socioeconomic History   Marital status: Widowed    Spouse name: Not on file   Number of children: Not on file   Years of education: Not on file   Highest education level: Not on file  Occupational History   Not on file  Tobacco Use   Smoking status: Former    Types: Cigarettes    Quit date: 11/17/1946    Years since quitting: 74.7   Smokeless tobacco: Never   Tobacco comments:    "never smoked much"  Vaping Use   Vaping Use: Never used  Substance and Sexual Activity   Alcohol use: No    Alcohol/week: 0.0 standard drinks   Drug use: No   Sexual activity: Not on file  Other Topics Concern   Not on file  Social History Narrative   Not on file   Social Determinants of Health   Financial Resource Strain: Not on file  Food Insecurity: Not on file  Transportation Needs: Not on file  Physical Activity: Not on file  Stress: Not on file  Social Connections: Not on file     Family History: The patient's family history includes Alcohol abuse in her father; Deep vein thrombosis in her brother; Diabetes in her son; Heart attack in her son; Heart disease in her brother, father, and son; Hyperlipidemia in her brother, father, sister, and son; Hypertension in her brother, father, sister, son, and son.  ROS:   Please see the history of present illness.     All other systems reviewed and are  negative.  EKGs/Labs/Other Studies Reviewed:    The following studies were reviewed today:  EKG:  EKG is  ordered today.  The ekg ordered today demonstrates  08/14/21:  junctional bradycardia with sinus bradycardia with low voltage p waves; regular rhythm rate 58  Cardiac Event Monitoring: Date:02/13/21 Results: The basic underlying rhythm is normal sinus rhythm Rare PACs and PVCs are noted Nonsustained SVT up to 5 beats is noted. No atrial fibrillation, ventricular tachycardia, or frequent ventricular ectopy is noted.   Overall unremarkable study.  No atrial fibrillation.  Premature beats are noted.  Continue amiodarone.  Transthoracic Echocardiogram: Date: 12/30/2019 Results:  1. Left ventricular ejection fraction, by estimation, is 60 to 65%. The  left ventricle has hyperdynamic function. The left ventricle has no  regional wall motion abnormalities. There is basal septal hypertrophy left  ventricular hypertrophy. Left  ventricular diastolic parameters were normal.   2. Right ventricular systolic function is normal. The right ventricular  size is normal.   3. Left atrial size was mildly dilated.   4. The mitral valve is normal in structure and function. Mild mitral  valve regurgitation. No evidence of mitral stenosis.   5. The aortic valve is tricuspid. Aortic valve regurgitation is trivial .  Mild aortic valve sclerosis is present, with no evidence of aortic valve  stenosis.   CTPE  Date: 06/08/2018 Results: Aortic Atherosclerosis LM and LAD CAC  NM Stress Testing : Date: 09/19/2016 Results: IMPRESSION: 1. No reversible ischemia or infarction.   2. Normal left ventricular wall motion.   3. Left ventricular ejection fraction 73%   4. Non invasive risk stratification*: Low  Recent Labs: 01/24/2021: ALT 11  Recent Lipid Panel    Component Value Date/Time   CHOL 190 01/24/2021 1106  TRIG 335 (H) 01/24/2021 1106   HDL 36 (L) 01/24/2021 1106   CHOLHDL 5.3 (H)  01/24/2021 1106   CHOLHDL 6.5 02/11/2009 1850   VLDL 68 (H) 02/11/2009 1850   LDLCALC 98 01/24/2021 1106   LDLDIRECT 226 (H) 04/20/2020 1116     Risk Assessment/Calculations:    CHA2DS2-VASc Score = 7   This indicates a 11.2% annual risk of stroke. The patient's score is based upon: CHF History: 0 HTN History: 1 Diabetes History: 0 Stroke History: 2 Vascular Disease History: 1 Age Score: 2 Gender Score: 1         Physical Exam:    VS:  BP (!) 160/68   Pulse (!) 56   Ht 5\' 2"  (1.575 m)   Wt 132 lb (59.9 kg)   SpO2 98%   BMI 24.14 kg/m     Wt Readings from Last 3 Encounters:  08/14/21 132 lb (59.9 kg)  03/11/21 134 lb (60.8 kg)  01/24/21 134 lb (60.8 kg)     GEN:  Elderly female well developed in no acute distress HEENT: Normal NECK: No JVD LYMPHATICS: No lymphadenopathy CARDIAC: Regular bradycardia, no murmurs, rubs, gallops RESPIRATORY:  Decrease breath sounds with no wheezes or crackles ABDOMEN: Soft, non-tender, non-distended MUSCULOSKELETAL:  L LE with non pitting edema and pain on light palptation; R BKA site non purulent SKIN: Warm and dry NEUROLOGIC:  Alert and oriented x 3 PSYCHIATRIC:  Normal affect   ASSESSMENT:    1. Chest pain of uncertain etiology   2. Paroxysmal atrial fibrillation (HCC)   3. 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 PAD (peripheral artery disease) ([place])   5. History of CVA (cerebrovascular accident)   6. Essential hypertension   7. SOB (shortness of breath)   8. Therapeutic drug monitoring    PLAN:    Chest Pain PAF with CHASDVSC of 7 and LGI Bleed on no AC; on amio and BB Hx of CVA Hx of PAD HLD on Pralulent HTN - we had discussed the pros and cons of stress testing and given her PAD and that we would likely not proceed with LHC; will trial imdur 30 mg PO Daily; discussed HA side effects; if worsening NM Stress is reasonable - discuss amb BP monitoring -  will get CXR PA and Lateral; based on results may increase lasix from 3X week to daily - will get PFTs given chronic amiodarone use; currently AF is well controlled on this regimen  Will plan for 4 months  follow up unless new symptoms or abnormal test results warranting change in plan FU with Dr. Theora Master  Time Spent Directly with Patient:   I have spent a total of 40 minutes with the patient reviewing notes, imaging, EKGs, labs and examining the patient as well as establishing an assessment and plan that was discussed personally with the patient.  > 50% of time was spent in direct patient care and family.  Given her PAD we will plan for a conservative CP evaluation.   Medication Adjustments/Labs and Tests Ordered: Current medicines are reviewed at length with the patient today.  Concerns regarding medicines are outlined above.  Orders Placed This Encounter  Procedures   DG Chest 2 View   EKG 12-Lead   Pulmonary Function Test    Meds ordered this encounter  Medications   isosorbide mononitrate (IMDUR) 30 MG 24 hr tablet    Sig: Take 1 tablet (30 mg total)  by mouth daily.    Dispense:  90 tablet    Refill:  3     Patient Instructions  Medication Instructions:  Your physician has recommended you make the following change in your medication:  START: isosorbide mononitrate (Imdur) 30 mg by mouth daily  *If you need a refill on your cardiac medications before your next appointment, please call your pharmacy*   Lab Work: NONE If you have labs (blood work) drawn today and your tests are completely normal, you will receive your results only by: [place] (if you have MyChart) OR A paper copy in the mail If you have any lab test that is abnormal or we need to change your treatment, we will call you to review the results.   Testing/Procedures: Your physician has requested that you have a chest x- ray Chest X-ray Instructions:    1. You may have this done at the  [facility], located in the [place] on the 1st floor.    2. You do no have to have an appointment.    3. [place], [place] [address]        [phone]        Monday - Friday  8:00 am - 5:00 pm   Your physician has recommended that you have a pulmonary function test. Pulmonary Function Tests are a group of tests that measure how well air moves in and out of your lungs.   Follow-Up: At [facility], you and your health needs are our priority.  As part of our continuing mission to provide you with exceptional heart care, we have created designated Provider Care Teams.  These Care Teams include your primary Cardiologist (physician) and [name] (APPs -  Physician Assistants and Nurse Practitioners) who all work together to provide you with the care you need, when you need it.  We recommend signing up for the patient portal called "MyChart".  Sign up information is provided on this After Visit Summary.  MyChart is used to connect with patients for Virtual Visits (Telemedicine).  Patients are able to view lab/test results, encounter notes, upcoming appointments, etc.  Non-urgent messages can be sent to your provider as well.   To learn more about what you can do with MyChart, go to [email or web address].    Your next appointment:   4 month(s)  The format for your next appointment:   In Person  Provider:   You may see Karen Grooms, MD or one of the following [name] on your designated Care Team:   Karen Kicks, NP     Signed, Werner Lean, MD  08/14/2021 11:12 AM    [place]

## 2021-08-26 ENCOUNTER — Ambulatory Visit (INDEPENDENT_AMBULATORY_CARE_PROVIDER_SITE_OTHER): Payer: Medicare Other | Admitting: Surgery

## 2021-08-26 ENCOUNTER — Other Ambulatory Visit: Payer: Self-pay | Admitting: Surgery

## 2021-08-26 ENCOUNTER — Ambulatory Visit (INDEPENDENT_AMBULATORY_CARE_PROVIDER_SITE_OTHER)
Admission: RE | Admit: 2021-08-26 | Discharge: 2021-08-26 | Disposition: A | Discharge status: Home / Self Care | Payer: Medicare Other | Source: Ambulatory Visit | Attending: Surgery | Admitting: Surgery

## 2021-08-26 ENCOUNTER — Ambulatory Visit (HOSPITAL_COMMUNITY)
Admission: RE | Admit: 2021-08-26 | Discharge: 2021-08-26 | Disposition: A | Discharge status: Home / Self Care | Payer: Medicare Other | Source: Ambulatory Visit | Attending: Surgery | Admitting: Surgery

## 2021-08-26 ENCOUNTER — Encounter: Payer: Self-pay | Admitting: Surgery

## 2021-08-26 ENCOUNTER — Other Ambulatory Visit: Payer: Self-pay

## 2021-08-26 VITALS — BP 190/65 | HR 54 | Temp 97.9°F | Resp 20

## 2021-08-26 DIAGNOSIS — I739 Peripheral vascular disease, unspecified: Secondary | ICD-10-CM

## 2021-08-26 DIAGNOSIS — I70213 Atherosclerosis of native arteries of extremities with intermittent claudication, bilateral legs: Secondary | ICD-10-CM

## 2021-08-26 DIAGNOSIS — Z20822 Contact with and (suspected) exposure to covid-19: Secondary | ICD-10-CM | POA: Diagnosis not present

## 2021-08-26 NOTE — Progress Notes (Signed)
[facility] [place]  Patient name: [name] MRN: [id number] DOB: 03-05-1928 Sex: female   REASON FOR VISIT:    Follow up  [place] ILLNESS:    [name] is a 87 y.o. 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  She has undergone multiple attempts at limb salvage on the left leg, the most recent was on 05/04/2020.  A thrombectomy of her left femoral to anterior tibial bypass graft was performed and a Viabahn stent was placed within the proximal aspect of the femoral tibial bypass.  At her last visit, the velocity profile in her bypass graft was concerning for possible bypass graft failure.  She continues to do very well.  Her daughter is concerned about a area of redness on her heel.  There is no skin breakdown.  PAST MEDICAL HISTORY:   Past Medical History:  Diagnosis Date   Anemia    Anginal pain ([place])    Arthritis    "qwhere" (09/05/2016)   Atrial fibrillation ([place]) 09/2016   Chronic lower back pain    Complication of anesthesia    "takes a long time for it to wear off; I can hallucinate if I take too much" (09/05/2016)   DVT (deep venous thrombosis) ([place]) 10/2009   Fall from steps 08/31/2013   Fx. pelvis, Left Hip, Left Elbow   Fibromyalgia    GERD (gastroesophageal reflux disease)    09/22/16- "no too much anymore"   GI bleed 10/24/2016   High cholesterol    History of blood transfusion    History of hiatal hernia    Hypertension    Macular degeneration, wet ([place])    "started in right eye; now legally blind in that eye; now started in left eye but pretty much in control" (09/05/2016)   Osteoporosis    Peripheral vascular disease ([place])    nonviable tissue Right foot   PONV (postoperative nausea and vomiting)    Squamous cell carcinoma of skin of right calf Aug. 2015   Stroke [facility])    TIA's no residual   TIA (transient ischemic attack)    "several at once; none  in a long time" (09/05/2016)     FAMILY HISTORY:   Family History  Problem Relation Age of Onset   Heart disease Father        Heart Disease before age 39   Hyperlipidemia Father    Hypertension Father    Alcohol abuse Father    Heart disease Brother    Hyperlipidemia Brother    Hypertension Brother    Deep vein thrombosis Brother    Heart disease Son        Heart Disease before age 53   Hyperlipidemia Son    Hypertension Son    Heart attack Son    Diabetes Son    Hypertension Son    Hyperlipidemia Sister    Hypertension Sister     SOCIAL HISTORY:   Social History   Tobacco Use   Smoking status: Former    Types: Cigarettes    Quit date: 11/17/1946    Years since quitting: 74.8   Smokeless tobacco: Never   Tobacco comments:    "never smoked much"  Substance Use Topics   Alcohol use: No    Alcohol/week: 0.0 standard drinks     ALLERGIES:   Allergies  Allergen Reactions   Motrin [Ibuprofen] Other (See Comments)    ADVERSE REACTION - GI BLEED  Statins Other (See Comments)    ADVERSE REACTION MUSCLE PAIN & WEAKNESS   Eliquis [Apixaban] Other (See Comments)    Lower GI bleeding   Morphine And Related Other (See Comments)    HALLUCINATIONS REACTION IS SIDE EFFECT   Promethazine Hcl Other (See Comments)    IV  Drug only, makes her act crazy   Sulfa Antibiotics Nausea And Vomiting     CURRENT MEDICATIONS:   Current Outpatient Medications  Medication Sig Dispense Refill   acetaminophen (TYLENOL) 500 MG tablet Take 1,000 mg by mouth every 8 (eight) hours as needed for mild pain.      Alirocumab (PRALUENT) 150 MG/ML SOAJ Inject 150 mg into the skin every 14 (fourteen) days. 2 mL 11   amiodarone (PACERONE) 200 MG tablet Take 1 tablet (200 mg total) by mouth daily. 90 tablet 3   clopidogrel (PLAVIX) 75 MG tablet Take 1 tablet (75 mg total) by mouth daily. 30 tablet 1   Cyanocobalamin (VITAMIN B-12 PO) Place 1 tablet under the tongue daily.     diazepam  (VALIUM) 5 MG tablet Take 0.5 tablets (2.5 mg total) by mouth at bedtime as needed for anxiety. 20 tablet 0   diphenoxylate-atropine (LOMOTIL) 2.5-0.025 MG tablet Take 1 tablet by mouth as needed for diarrhea or loose stools.     furosemide (LASIX) 20 MG tablet Take 20 mg by mouth every Monday, Wednesday, and Friday.     gabapentin (NEURONTIN) 300 MG capsule Take 300 mg by mouth See admin instructions. Take 300mg  by mouth three times daily.  May take an additional 300mg  at bedtime as needed for pain.     isosorbide mononitrate (IMDUR) 30 MG 24 hr tablet Take 1 tablet (30 mg total) by mouth daily. 90 tablet 3   levothyroxine (SYNTHROID) 50 MCG tablet Take 50 mcg by mouth every morning.     loperamide (IMODIUM) 2 MG capsule Take 2 mg by mouth See admin instructions. Take 2mg  by mouth as needed after each loose stool.  Do not exceed 8 capsules in 24 hours.     metoprolol tartrate (LOPRESSOR) 25 MG tablet Take 1 tablet (25 mg total) by mouth at bedtime. 30 tablet 1   Multiple Vitamins-Minerals (IMMUNE SUPPORT VITAMIN C PO) Take 1 tablet by mouth 2 (two) times daily.      Multiple Vitamins-Minerals (PRESERVISION AREDS PO) Take 1 capsule by mouth in the morning and at bedtime.     oxycodone (OXY-IR) 5 MG capsule Take 1 capsule (5 mg total) by mouth every 6 (six) hours as needed for pain. 15 capsule 0   potassium chloride (K-DUR,KLOR-CON) 10 MEQ tablet Take 10 mEq by mouth daily.     valsartan-hydrochlorothiazide (DIOVAN-HCT) 160-12.5 MG tablet Take 0.5 tablets by mouth daily. 30 tablet 0   VITAMIN D PO Take 1 capsule by mouth at bedtime.     No current facility-administered medications for this visit.    REVIEW OF SYSTEMS:   [X]  denotes positive finding, [ ]  denotes negative finding Cardiac  Comments:  Chest pain or chest pressure:    Shortness of breath upon exertion:    Short of breath when lying flat:    Irregular heart rhythm:        Vascular    Pain in calf, thigh, or hip brought on by  ambulation:    Pain in feet at night that wakes you up from your sleep:     Blood clot in your veins:    Leg swelling:  Pulmonary    Oxygen at home:    Productive cough:     Wheezing:         Neurologic    Sudden weakness in arms or legs:     Sudden numbness in arms or legs:     Sudden onset of difficulty speaking or slurred speech:    Temporary loss of vision in one eye:     Problems with dizziness:         Gastrointestinal    Blood in stool:     Vomited blood:         Genitourinary    Burning when urinating:     Blood in urine:        Psychiatric    Major depression:         Hematologic    Bleeding problems:    Problems with blood clotting too easily:        Skin    Rashes or ulcers:        Constitutional    Fever or chills:      PHYSICAL EXAM:   There were no vitals filed for this visit.  GENERAL: The patient is a well-nourished female, in no acute distress. The vital signs are documented above. CARDIAC: There is a regular rate and rhythm.  VASCULAR: Palpable anterior tibial bypass graft pulse PULMONARY: Non-labored respirations MUSCULOSKELETAL: There are no major deformities or cyanosis. NEUROLOGIC: No focal weakness or paresthesias are detected. SKIN: There are no ulcers or rashes noted. PSYCHIATRIC: The patient has a normal affect.  STUDIES:   I have reviewed her ultrasound with the following findings:  Great Toe 54   +-------+-----------+-----------+------------+------------+  ABI/TBIToday's ABIToday's TBIPrevious ABIPrevious TBI  +-------+-----------+-----------+------------+------------+  Right  BKA                                             +-------+-----------+-----------+------------+------------+  Left   0.78       0.26       0.46        0.22          +-------+-----------+-----------+------------+------------+      +--------------------+--------+---------------+----------+---------+                       PSV cm/sStenosis       Waveform  Comments   +--------------------+--------+---------------+----------+---------+  Inflow              207     50-74% stenosisbiphasic  turbulent  +--------------------+--------+---------------+----------+---------+  Proximal Anastomosis174     50-70% stenosismonophasicturbulent  +--------------------+--------+---------------+----------+---------+  Proximal Graft      127                    monophasic           +--------------------+--------+---------------+----------+---------+  Mid Graft           30                     monophasic           +--------------------+--------+---------------+----------+---------+  Distal Graft        34                     monophasic           +--------------------+--------+---------------+----------+---------+  Distal Anastomosis  34  monophasic           +--------------------+--------+---------------+----------+---------+  Outflow             37                     monophasic           +--------------------+--------+---------------+----------+---------+   Summary:  Left: Patent bypass graft, however stenosis is noted at proximal  anastomosis, and diminished flow is noted at distal graft and into outflow  artery. Appears unchanged from last exam.  MEDICAL ISSUES:   Left leg atherosclerotic vascular disease: The patient's ultrasound today remained stable.  She continues to do well.  We had a conversation today about what we should do when her bypass graft occludes again.  She is going to think about this, however would probably want a another attempt at thrombectomy.  I have her scheduled for follow-up with me in 6 months    Leia Alf, MD, FACS [facility] [phone] Pager [phone]

## 2021-08-28 ENCOUNTER — Other Ambulatory Visit: Payer: Self-pay

## 2021-08-28 DIAGNOSIS — I70213 Atherosclerosis of native arteries of extremities with intermittent claudication, bilateral legs: Secondary | ICD-10-CM

## 2021-08-29 ENCOUNTER — Other Ambulatory Visit: Payer: Self-pay

## 2021-08-29 ENCOUNTER — Ambulatory Visit (INDEPENDENT_AMBULATORY_CARE_PROVIDER_SITE_OTHER): Payer: Medicare Other | Admitting: Internal Medicine

## 2021-08-29 DIAGNOSIS — G4700 Insomnia, unspecified: Secondary | ICD-10-CM | POA: Diagnosis not present

## 2021-08-29 DIAGNOSIS — M169 Osteoarthritis of hip, unspecified: Secondary | ICD-10-CM | POA: Diagnosis not present

## 2021-08-29 DIAGNOSIS — Z5181 Encounter for therapeutic drug level monitoring: Secondary | ICD-10-CM

## 2021-08-29 DIAGNOSIS — E038 Other specified hypothyroidism: Secondary | ICD-10-CM | POA: Diagnosis not present

## 2021-08-29 DIAGNOSIS — I10 Essential (primary) hypertension: Secondary | ICD-10-CM | POA: Diagnosis not present

## 2021-08-29 DIAGNOSIS — E7800 Pure hypercholesterolemia, unspecified: Secondary | ICD-10-CM | POA: Diagnosis not present

## 2021-08-29 DIAGNOSIS — E782 Mixed hyperlipidemia: Secondary | ICD-10-CM | POA: Diagnosis not present

## 2021-08-29 DIAGNOSIS — M810 Age-related osteoporosis without current pathological fracture: Secondary | ICD-10-CM | POA: Diagnosis not present

## 2021-08-29 DIAGNOSIS — R079 Chest pain, unspecified: Secondary | ICD-10-CM

## 2021-08-29 DIAGNOSIS — R0602 Shortness of breath: Secondary | ICD-10-CM

## 2021-08-29 DIAGNOSIS — K219 Gastro-esophageal reflux disease without esophagitis: Secondary | ICD-10-CM | POA: Diagnosis not present

## 2021-08-29 LAB — PULMONARY FUNCTION TEST
DL/VA % pred: 110 %
DL/VA: 4.5 ml/min/mmHg/L
DLCO cor % pred: 63 %
DLCO cor: 10.82 ml/min/mmHg
DLCO unc % pred: 63 %
DLCO unc: 10.82 ml/min/mmHg
FEF 25-75 Post: 1.39 L/sec
FEF 25-75 Pre: 0.79 L/sec
FEF2575-%Change-Post: 76 %
FEF2575-%Pred-Post: 181 %
FEF2575-%Pred-Pre: 102 %
FEV1-%Change-Post: 16 %
FEV1-%Pred-Post: 85 %
FEV1-%Pred-Pre: 73 %
FEV1-Post: 1.19 L
FEV1-Pre: 1.02 L
FEV1FVC-%Change-Post: 0 %
FEV1FVC-%Pred-Pre: 105 %
FEV6-%Change-Post: 16 %
FEV6-%Pred-Post: 89 %
FEV6-%Pred-Pre: 76 %
FEV6-Post: 1.58 L
FEV6-Pre: 1.36 L
FEV6FVC-%Pred-Post: 107 %
FEV6FVC-%Pred-Pre: 107 %
FVC-%Change-Post: 16 %
FVC-%Pred-Post: 83 %
FVC-%Pred-Pre: 71 %
FVC-Post: 1.58 L
FVC-Pre: 1.36 L
Post FEV1/FVC ratio: 75 %
Post FEV6/FVC ratio: 100 %
Pre FEV1/FVC ratio: 75 %
Pre FEV6/FVC Ratio: 100 %
RV % pred: 93 %
RV: 2.33 L
TLC % pred: 91 %
TLC: 4.34 L

## 2021-08-29 NOTE — Patient Instructions (Signed)
Full PFT performed today. °

## 2021-08-29 NOTE — Progress Notes (Signed)
Full PFT performed today. °

## 2021-09-02 ENCOUNTER — Encounter (HOSPITAL_COMMUNITY): Payer: Medicare Other

## 2021-09-02 ENCOUNTER — Ambulatory Visit: Payer: Medicare Other | Admitting: Surgery

## 2021-09-12 ENCOUNTER — Telehealth: Payer: Self-pay

## 2021-09-12 NOTE — Telephone Encounter (Signed)
Called pt daughter and informed her of MD recommendation to stop amiodarone if pt SOB is a major complaint.  Daughter expresses that it is, she will stop amiodarone.  However, daughter is concerned that pt will go into Afib and not know it as she does not feel when she is in afib.  I advised her that I will send concern to MD to address.  I also advised her to restart imdur and if she develops loose stools to immediately stop and call the office to inform that she is no longer taking med.  She expresses that she will.  Daughter had no further questions.  Amiodarone removed from med list.

## 2021-09-12 NOTE — Telephone Encounter (Signed)
-----   Message from Belva Crome, MD sent at 09/10/2021 12:07 PM EDT ----- Recommend no CV w/u unless CV symptoms persist. ----- Message ----- From: Werner Lean, MD Sent: 09/09/2021   1:02 PM EDT To: Belva Crome, MD, Ramond Dial, RPH-CPP, #  Rechallenge is Edwyna Ready;  If Imdur causes diarrhea the second time our next medication would be Ranexa 500 mg; as her resting heart rate would not tolerate AV nodal therapy  Thanks, MAC   ----- Message ----- From: Ramond Dial, RPH-CPP Sent: 09/09/2021  12:19 PM EDT To: Werner Lean, MD, #  Its a low likely hood but cant completely rule it out. Patient could stop imdur and see if diarrhea improves. But she would then need to re-challenge with the imdur to see if symptoms come back. Could also stop imdur and try another agent such as ranexa or CCB.  Ill leave that decision to Dr. Gasper Sells ----- Message ----- From: Precious Gilding, RN Sent: 09/06/2021   5:04 PM EDT To: [facility]  Daughter also wants to report that pt has had loose stools since starting imdur.  She denies other GI r/t issues no recent sickness.  Reports loose stools started 5 days after starting imdur.  Will route to pharmacist to see if loose stools are a side effect of imdur.

## 2021-09-30 ENCOUNTER — Encounter (HOSPITAL_COMMUNITY): Payer: Self-pay

## 2021-09-30 ENCOUNTER — Emergency Department (HOSPITAL_COMMUNITY)
Admission: EM | Admit: 2021-09-30 | Discharge: 2021-10-01 | Disposition: A | Discharge status: Home / Self Care | Payer: Medicare Other | Attending: Emergency Medicine | Admitting: Emergency Medicine

## 2021-09-30 ENCOUNTER — Emergency Department (HOSPITAL_COMMUNITY): Payer: Medicare Other

## 2021-09-30 DIAGNOSIS — N183 Chronic kidney disease, stage 3 unspecified: Secondary | ICD-10-CM | POA: Insufficient documentation

## 2021-09-30 DIAGNOSIS — Z8679 Personal history of other diseases of the circulatory system: Secondary | ICD-10-CM | POA: Diagnosis not present

## 2021-09-30 DIAGNOSIS — I5043 Acute on chronic combined systolic (congestive) and diastolic (congestive) heart failure: Secondary | ICD-10-CM | POA: Diagnosis not present

## 2021-09-30 DIAGNOSIS — Z978 Presence of other specified devices: Secondary | ICD-10-CM

## 2021-09-30 DIAGNOSIS — Z85828 Personal history of other malignant neoplasm of skin: Secondary | ICD-10-CM | POA: Insufficient documentation

## 2021-09-30 DIAGNOSIS — Z96622 Presence of left artificial elbow joint: Secondary | ICD-10-CM | POA: Insufficient documentation

## 2021-09-30 DIAGNOSIS — Z79899 Other long term (current) drug therapy: Secondary | ICD-10-CM | POA: Diagnosis not present

## 2021-09-30 DIAGNOSIS — R1084 Generalized abdominal pain: Secondary | ICD-10-CM | POA: Diagnosis not present

## 2021-09-30 DIAGNOSIS — Z87891 Personal history of nicotine dependence: Secondary | ICD-10-CM | POA: Insufficient documentation

## 2021-09-30 DIAGNOSIS — R319 Hematuria, unspecified: Secondary | ICD-10-CM | POA: Diagnosis not present

## 2021-09-30 DIAGNOSIS — Z23 Encounter for immunization: Secondary | ICD-10-CM | POA: Diagnosis not present

## 2021-09-30 DIAGNOSIS — I130 Hypertensive heart and chronic kidney disease with heart failure and stage 1 through stage 4 chronic kidney disease, or unspecified chronic kidney disease: Secondary | ICD-10-CM | POA: Insufficient documentation

## 2021-09-30 DIAGNOSIS — N390 Urinary tract infection, site not specified: Secondary | ICD-10-CM | POA: Diagnosis not present

## 2021-09-30 DIAGNOSIS — R310 Gross hematuria: Secondary | ICD-10-CM

## 2021-09-30 DIAGNOSIS — E038 Other specified hypothyroidism: Secondary | ICD-10-CM | POA: Diagnosis not present

## 2021-09-30 DIAGNOSIS — N289 Disorder of kidney and ureter, unspecified: Secondary | ICD-10-CM | POA: Diagnosis not present

## 2021-09-30 DIAGNOSIS — Z7902 Long term (current) use of antithrombotics/antiplatelets: Secondary | ICD-10-CM | POA: Diagnosis not present

## 2021-09-30 DIAGNOSIS — I959 Hypotension, unspecified: Secondary | ICD-10-CM | POA: Diagnosis not present

## 2021-09-30 DIAGNOSIS — Z89511 Acquired absence of right leg below knee: Secondary | ICD-10-CM | POA: Diagnosis not present

## 2021-09-30 DIAGNOSIS — R58 Hemorrhage, not elsewhere classified: Secondary | ICD-10-CM | POA: Diagnosis not present

## 2021-09-30 DIAGNOSIS — R109 Unspecified abdominal pain: Secondary | ICD-10-CM | POA: Diagnosis not present

## 2021-09-30 DIAGNOSIS — I739 Peripheral vascular disease, unspecified: Secondary | ICD-10-CM | POA: Diagnosis not present

## 2021-09-30 DIAGNOSIS — I779 Disorder of arteries and arterioles, unspecified: Secondary | ICD-10-CM | POA: Diagnosis not present

## 2021-09-30 DIAGNOSIS — I10 Essential (primary) hypertension: Secondary | ICD-10-CM | POA: Diagnosis not present

## 2021-09-30 LAB — URINALYSIS, ROUTINE W REFLEX MICROSCOPIC

## 2021-09-30 LAB — COMPREHENSIVE METABOLIC PANEL
ALT: 12 U/L (ref 0–44)
AST: 15 U/L (ref 15–41)
Albumin: 4.2 g/dL (ref 3.5–5.0)
Alkaline Phosphatase: 66 U/L (ref 38–126)
Anion gap: 10 (ref 5–15)
BUN: 36 mg/dL — ABNORMAL HIGH (ref 8–23)
CO2: 24 mmol/L (ref 22–32)
Calcium: 9.1 mg/dL (ref 8.9–10.3)
Chloride: 101 mmol/L (ref 98–111)
Creatinine, Ser: 1.39 mg/dL — ABNORMAL HIGH (ref 0.44–1.00)
GFR, Estimated: 36 mL/min — ABNORMAL LOW (ref >60)
Glucose, Bld: 111 mg/dL — ABNORMAL HIGH (ref 70–99)
Potassium: 4.5 mmol/L (ref 3.5–5.1)
Sodium: 135 mmol/L (ref 135–145)
Total Bilirubin: 1 mg/dL (ref 0.3–1.2)
Total Protein: 7.1 g/dL (ref 6.5–8.1)

## 2021-09-30 LAB — CBC WITH DIFFERENTIAL/PLATELET
Abs Immature Granulocytes: 0.1 10*3/uL — ABNORMAL HIGH (ref 0.00–0.07)
Basophils Absolute: 0.1 10*3/uL (ref 0.0–0.1)
Basophils Relative: 0 %
Eosinophils Absolute: 0.1 10*3/uL (ref 0.0–0.5)
Eosinophils Relative: 1 %
HCT: 37.1 % (ref 36.0–46.0)
Hemoglobin: 12 g/dL (ref 12.0–15.0)
Immature Granulocytes: 1 %
Lymphocytes Relative: 14 %
Lymphs Abs: 1.6 10*3/uL (ref 0.7–4.0)
MCH: 32.1 pg (ref 26.0–34.0)
MCHC: 32.3 g/dL (ref 30.0–36.0)
MCV: 99.2 fL (ref 80.0–100.0)
Monocytes Absolute: 1.5 10*3/uL — ABNORMAL HIGH (ref 0.1–1.0)
Monocytes Relative: 13 %
Neutro Abs: 8.5 10*3/uL — ABNORMAL HIGH (ref 1.7–7.7)
Neutrophils Relative %: 71 %
Platelets: 224 10*3/uL (ref 150–400)
RBC: 3.74 MIL/uL — ABNORMAL LOW (ref 3.87–5.11)
RDW: 13.9 % (ref 11.5–15.5)
WBC: 12 10*3/uL — ABNORMAL HIGH (ref 4.0–10.5)
nRBC: 0 % (ref 0.0–0.2)

## 2021-09-30 LAB — URINALYSIS, MICROSCOPIC (REFLEX)
RBC / HPF: >50 RBC/hpf (ref 0–5)
Squamous Epithelial / HPF: NONE SEEN (ref 0–5)
WBC, UA: >50 WBC/hpf (ref 0–5)

## 2021-09-30 LAB — WET PREP, GENITAL
Clue Cells Wet Prep HPF POC: NONE SEEN
Sperm: NONE SEEN
Trich, Wet Prep: NONE SEEN
Yeast Wet Prep HPF POC: NONE SEEN

## 2021-09-30 LAB — LACTIC ACID, PLASMA: Lactic Acid, Venous: 1.3 mmol/L (ref 0.5–1.9)

## 2021-09-30 LAB — PROTIME-INR
INR: 1 (ref 0.8–1.2)
Prothrombin Time: 13.4 seconds (ref 11.4–15.2)

## 2021-09-30 LAB — TYPE AND SCREEN
ABO/RH(D): O POS
Antibody Screen: NEGATIVE

## 2021-09-30 MED ORDER — IOHEXOL 350 MG/ML SOLN
80.0000 mL | Freq: Once | INTRAVENOUS | Status: AC | PRN
  Administered 2021-09-30: 80 mL via INTRAVENOUS

## 2021-09-30 NOTE — ED Triage Notes (Signed)
Pt was dx with  UTI this am at her doctors office and received rocephin there, starting the oral antibiotics in the morning She has had vaginal bleeding all day today and takes eliquis so family wanted her evaluated for the bleeding

## 2021-10-01 LAB — GC/CHLAMYDIA PROBE AMP (~~LOC~~) NOT AT ARMC
Chlamydia: NEGATIVE
Comment: NEGATIVE
Comment: NORMAL
Neisseria Gonorrhea: NEGATIVE

## 2021-10-01 NOTE — ED Notes (Addendum)
Report given to Adam Phenix at [facility].

## 2021-10-01 NOTE — ED Provider Notes (Addendum)
[facility] DEPT Provider Note   CSN: [id number] Arrival date & time: 09/30/21  1924     History No chief complaint on file.   [name] is a 87 y.o. female.  HPI     87yo female with history of atrial fibrillation, DVT, CVA, PVD with history of right BKA, left fem-tibial bypass, iliac stent, with velocity profile in graft concerning for bypass graft failure, critical LLE ischemia on plavix, who presents with concern for hematuria.  Initially unclear where blood is coming from. Reports it just comes out of her, but is not sure if it is vaginal, rectal or urinary.  Reports she is just leaking blood all day long, constant. Has frequent urination of small amounts.  Has associated lower abdominal pain. Has not had BM and is not sure if this is contributing.  Nausea present. Has not vomited. Not acting herself,lower energy. Does have pain with urination.  Has hx of GI bleeding in the past while on eliquis   Past Medical History:  Diagnosis Date   Anemia    Anginal pain ([place])    Arthritis    "qwhere" (09/05/2016)   Atrial fibrillation ([place]) 09/2016   Chronic lower back pain    Complication of anesthesia    "takes a long time for it to wear off; I can hallucinate if I take too much" (09/05/2016)   DVT (deep venous thrombosis) ([place]) 10/2009   Fall from steps 08/31/2013   Fx. pelvis, Left Hip, Left Elbow   Fibromyalgia    GERD (gastroesophageal reflux disease)    09/22/16- "no too much anymore"   GI bleed 10/24/2016   High cholesterol    History of blood transfusion    History of hiatal hernia    Hypertension    Macular degeneration, wet ([place])    "started in right eye; now legally blind in that eye; now started in left eye but pretty much in control" (09/05/2016)   Osteoporosis    Peripheral vascular disease ([place])    nonviable tissue Right foot   PONV (postoperative nausea and vomiting)    Squamous cell carcinoma of skin of right calf Aug.  2015   Stroke [facility])    TIA's no residual   TIA (transient ischemic attack)    "several at once; none in a long time" (09/05/2016)    Patient Active Problem List   Diagnosis Date Noted   Chest pain of uncertain etiology 62/95/2841   History of CVA (cerebrovascular accident) 08/14/2021   Thrombosis 05/04/2020   Secondary hypercoagulable state ([place]) 01/09/2020   Critical lower limb ischemia ([place]) 12/28/2019   Plantar fasciitis of left foot 06/10/2017   Idiopathic chronic venous hypertension of both lower extremities with inflammation 05/21/2017   Foot drop, left 05/21/2017   Decubitus ulcer of sacral region, unstageable ([place])    Deep tissue injury    Hypokalemia    Hypoalbuminemia due to protein-calorie malnutrition ([place])    Debilitated 01/21/2017   Neuropathic pain    Post-operative pain    Slow transit constipation    Debility    Ischemic leg    PAF (paroxysmal atrial fibrillation) (HCC)    Anemia of chronic disease    Chronic pain syndrome    Fibromyalgia    Stage 3 chronic kidney disease (HCC)    Benign essential HTN    Abnormal urinalysis    PVD (peripheral vascular disease) ([place]) 01/13/2017   Post-op pain 01/03/2017   Phantom limb pain ([place]) 01/03/2017  S/P unilateral BKA (below knee amputation), right ([place]) 12/30/2016   Acute on chronic combined systolic and diastolic CHF (congestive heart failure) ([place]) 12/22/2016   Anemia 12/22/2016   Palliative care encounter    Lower extremity pain, right    Acute GI bleeding 10/23/2016   Paroxysmal atrial fibrillation ([place]) 10/03/2016   Atherosclerosis of autologous vein bypass graft of left lower extremity with rest pain ([place]) 09/24/2016   PAD (peripheral artery disease) ([place]) 09/05/2016   Groin pain 04/02/2015   Aftercare following surgery of the circulatory system, NEC 12/13/2013   Shingles 10/26/2013   Anxiety 10/26/2013   Acute blood loss anemia 09/05/2013   Elbow fracture, left 09/05/2013   Left acetabular fracture  ([place]) 09/05/2013   Ankle fracture, left 09/05/2013   HTN (hypertension) 09/03/2013   HLD (hyperlipidemia) 09/03/2013   Peripheral vascular disease, unspecified ([place]) 05/10/2012   Chronic total occlusion of artery of the extremities ([place]) 01/19/2012    Past Surgical History:  Procedure Laterality Date   ABDOMINAL AORTAGRAM N/A 12/26/2014   Procedure: ABDOMINAL Maxcine Ham;  Surgeon: Serafina Mitchell, MD;  Location: [facility] LAB;  Service: Cardiovascular;  Laterality: N/A;   AMPUTATION Right 12/23/2016   Procedure: AMPUTATION BELOW KNEE;  Surgeon: Serafina Mitchell, MD;  Location: [facility];  Service: Vascular;  Laterality: Right;   AORTOGRAM N/A 09/26/2016   Procedure: AORTOGRAM;  Surgeon: Waynetta Sandy, MD;  Location: [facility];  Service: Vascular;  Laterality: N/A;   CARPAL TUNNEL RELEASE Right    CATARACT EXTRACTION W/ INTRAOCULAR LENS  IMPLANT, BILATERAL Bilateral    COLONOSCOPY     DILATION AND CURETTAGE OF UTERUS     DRESSING CHANGE UNDER ANESTHESIA Right 01/16/2017   Procedure: DRESSING CHANGE RIGHT BELOW KNEE AMPUTATION;  Surgeon: Serafina Mitchell, MD;  Location: [facility];  Service: Vascular;  Laterality: Right;   EYE SURGERY Right    "macular OR"   FEMORAL ARTERY STENT  12-11-10   Left SFA   FEMORAL-POPLITEAL BYPASS GRAFT Left 09/24/2016   Procedure: REDO FEMORAL TO POPLITEAL ARTERY BYPASS GRAFT USING 6MM PROPATEN RINGED GORTEX GRAFT;  Surgeon: Serafina Mitchell, MD;  Location: [facility] OR;  Service: Vascular;  Laterality: Left;   FEMORAL-TIBIAL BYPASS GRAFT Left 01/16/2017   Procedure: REDO BYPASS GRAFT FEMORAL-TIBIAL ARTERY WITH GORTEX GRAFT;  Surgeon: Serafina Mitchell, MD;  Location: [facility] OR;  Service: Vascular;  Laterality: Left;  AND LOWER LEG    I & D EXTREMITY Right 12/17/2016   Procedure: IRRIGATION AND DEBRIDEMENT RIGHT FOOT;  Surgeon: Serafina Mitchell, MD;  Location[facility] OR;  Service: Vascular;  Laterality: Right;   INCISION AND DRAINAGE OF WOUND Left 10/25/2009   leg/notes 11/13/2009    INSERTION OF ILIAC STENT Left 12/26/2014   Procedure: INSERTION OF ILIAC STENT;  Surgeon: Serafina Mitchell, MD;  Location: [facility] CATH LAB;  Service: Cardiovascular;  Laterality: Left;   INSERTION OF ILIAC STENT Left 09/26/2016   Procedure: SUB INTIMAL INSERTION OF SUPERFICIAL FEMORAL ARTERY AND BELOW KNEE BYPASS GRAFT;  Surgeon: Waynetta Sandy, MD;  Location: [facility];  Service: Vascular;  Laterality: Left;   INSERTION OF ILIAC STENT Left 05/04/2020   Procedure: INSERTION OF SUPERFICIAL FEMORAL ARTERY BYPASS GRAFT STENT;  Surgeon: Serafina Mitchell, MD;  Location: [facility];  Service: Vascular;  Laterality: Left;   JOINT REPLACEMENT     knee   JOINT REPLACEMENT Left Oct. 17, 2014   Elbow ( pt fell 08-31-13 )   LOWER EXTREMITY ANGIOGRAM Left 12/27/2019  Procedure: Intraoperative Left Lower Extremity Angiogram;  Surgeon: Angelia Mould, MD;  Location: [facility];  Service: Vascular;  Laterality: Left;   LOWER EXTREMITY ANGIOGRAPHY N/A 09/21/2018   Procedure: LOWER EXTREMITY ANGIOGRAPHY;  Surgeon: Serafina Mitchell, MD;  Location: [facility] CV LAB;  Service: Cardiovascular;  Laterality: N/A;   ORIF SHOULDER FRACTURE Right    "it was crushed"   PATCH ANGIOPLASTY Left 05/04/2020   Procedure: BALLOON  ANGIOPLASTY;  Surgeon: Serafina Mitchell, MD;  Location: [facility] OR;  Service: Vascular;  Laterality: Left;   PERIPHERAL VASCULAR CATHETERIZATION N/A 10/30/2015   Procedure: Abdominal Aortogram w/Lower Extremity;  Surgeon: Serafina Mitchell, MD;  Location: [facility] CV LAB;  Service: Cardiovascular;  Laterality: N/A;   PERIPHERAL VASCULAR CATHETERIZATION  10/30/2015   Procedure: Peripheral Vascular Intervention;  Surgeon: Serafina Mitchell, MD;  Location: [facility] CV LAB;  Service: Cardiovascular;;   PERIPHERAL VASCULAR CATHETERIZATION N/A 04/01/2016   Procedure: Abdominal Aortogram w/Lower Extremity;  Surgeon: Serafina Mitchell, MD;  Location: [facility] CV LAB;  Service: Cardiovascular;  Laterality: N/A;    PERIPHERAL VASCULAR CATHETERIZATION Left 04/01/2016   Procedure: Peripheral Vascular Atherectomy;  Surgeon: Serafina Mitchell, MD;  Location: [facility] CV LAB;  Service: Cardiovascular;  Laterality: Left;  Superficial femoral artery.   PERIPHERAL VASCULAR CATHETERIZATION Right 09/05/2016   "stent"   PERIPHERAL VASCULAR CATHETERIZATION N/A 09/05/2016   Procedure: Abdominal Aortogram w/Lower Extremity;  Surgeon: Serafina Mitchell, MD;  Location: [facility] CV LAB;  Service: Cardiovascular;  Laterality: N/A;   PERIPHERAL VASCULAR CATHETERIZATION Right 09/05/2016   Procedure: Peripheral Vascular Intervention;  Surgeon: Serafina Mitchell, MD;  Location: [facility] CV LAB;  Service: Cardiovascular;  Laterality: Right;  Superficial Femoral   PERIPHERAL VASCULAR CATHETERIZATION Left 09/09/2016   Procedure: Lower Extremity Angiography;  Surgeon: Serafina Mitchell, MD;  Location: [facility] CV LAB;  Service: Cardiovascular;  Laterality: Left;   PR VEIN BYPASS GRAFT,AORTO-FEM-POP  09-13-09   Left Fem-pop   THROMBECTOMY FEMORAL ARTERY Right 09/26/2016   Procedure: Thromboembolectomy Right Lower Extremity, Right Femoral Artery Endarterectomy with Patch Angioplasty; Right Lower Extremity Angiogram ;  Surgeon: Waynetta Sandy, MD;  Location: [facility];  Service: Vascular;  Laterality: Right;   THROMBECTOMY FEMORAL ARTERY Left 12/27/2019   Procedure: Redo left femoral artery exposure, Thrombectomy of left deep femoral artery to anterior tibial artery bypass;  Surgeon: Angelia Mould, MD;  Location: [facility] OR;  Service: Vascular;  Laterality: Left;   THROMBECTOMY FEMORAL ARTERY Left 05/04/2020   Procedure: THROMBECTOMY LEFT LEG;  Surgeon: Serafina Mitchell, MD;  Location: [facility] OR;  Service: Vascular;  Laterality: Left;   TOTAL ELBOW ARTHROPLASTY Left 09/03/2013   Procedure: LEFT TOTAL ELBOW ARTHROPLASTY;  Surgeon: Roseanne Kaufman, MD;  Location: [facility];  Service: Orthopedics;  Laterality: Left;   TOTAL KNEE  ARTHROPLASTY Left 06/2006   TRANSMETATARSAL AMPUTATION Right 12/11/2016   Procedure: TRANSMETATARSAL AMPUTATION;  Surgeon: Serafina Mitchell, MD;  Location: [facility] OR;  Service: Vascular;  Laterality: Right;   TUBAL LIGATION     VAGINAL HYSTERECTOMY       OB History   No obstetric history on file.     Family History  Problem Relation Age of Onset   Heart disease Father        Heart Disease before age 56   Hyperlipidemia Father    Hypertension Father    Alcohol abuse Father    Heart disease Brother    Hyperlipidemia Brother  Hypertension Brother    Deep vein thrombosis Brother    Heart disease Son        Heart Disease before age 49   Hyperlipidemia Son    Hypertension Son    Heart attack Son    Diabetes Son    Hypertension Son    Hyperlipidemia Sister    Hypertension Sister     Social History   Tobacco Use   Smoking status: Former    Types: Cigarettes    Quit date: 11/17/1946    Years since quitting: 74.9   Smokeless tobacco: Never   Tobacco comments:    "never smoked much"  Vaping Use   Vaping Use: Never used  Substance Use Topics   Alcohol use: No    Alcohol/week: 0.0 standard drinks   Drug use: No    Home Medications Prior to Admission medications   Medication Sig Start Date End Date Taking? Authorizing Provider  acetaminophen (TYLENOL) 500 MG tablet Take 1,000 mg by mouth every 8 (eight) hours as needed for mild pain.    Yes [provider]  Alirocumab (PRALUENT) 150 MG/ML SOAJ Inject 150 mg into the skin every 14 (fourteen) days. 10/10/20  Yes Belva Crome, MD  clopidogrel (PLAVIX) 75 MG tablet Take 1 tablet (75 mg total) by mouth daily. 01/28/17  Yes Angiulli, Lavon Paganini, PA-C  Cyanocobalamin (VITAMIN B-12 PO) Place 1 tablet under the tongue daily.   Yes [provider]  furosemide (LASIX) 20 MG tablet Take 20 mg by mouth every Monday, Wednesday, and Friday.   Yes [provider]  gabapentin (NEURONTIN) 300 MG capsule Take 300 mg by  mouth See admin instructions. Take 300mg  by mouth three times daily.  May take an additional 300mg  at bedtime as needed for pain.   Yes [provider]  isosorbide mononitrate (IMDUR) 30 MG 24 hr tablet Take 1 tablet (30 mg total) by mouth daily. 08/14/21  Yes Chandrasekhar, Mahesh A, MD  levothyroxine (SYNTHROID) 50 MCG tablet Take 50 mcg by mouth every morning. 08/07/21  Yes [provider]  loperamide (IMODIUM) 2 MG capsule Take 2 mg by mouth See admin instructions. Take 2mg  by mouth as needed after each loose stool.  Do not exceed 8 capsules in 24 hours.   Yes [provider]  metoprolol tartrate (LOPRESSOR) 25 MG tablet Take 1 tablet (25 mg total) by mouth at bedtime. Patient taking differently: Take 25 mg by mouth 2 (two) times daily. 01/28/17  Yes Angiulli, Lavon Paganini, PA-C  Multiple Vitamins-Minerals (IMMUNE SUPPORT VITAMIN C PO) Take 1 tablet by mouth 2 (two) times daily.    Yes [provider]  Multiple Vitamins-Minerals (PRESERVISION AREDS PO) Take 1 capsule by mouth in the morning and at bedtime.   Yes [provider]  oxycodone (OXY-IR) 5 MG capsule Take 1 capsule (5 mg total) by mouth every 6 (six) hours as needed for pain. 05/06/20  Yes Dagoberto Ligas, PA-C  potassium chloride (K-DUR,KLOR-CON) 10 MEQ tablet Take 10 mEq by mouth daily.   Yes [provider]  valsartan-hydrochlorothiazide (DIOVAN-HCT) 160-12.5 MG tablet Take 0.5 tablets by mouth daily. 01/28/17  Yes Angiulli, Lavon Paganini, PA-C  VITAMIN D PO Take 1,000 Units by mouth daily.   Yes [provider]  cefUROXime (CEFTIN) 250 MG tablet Take 500 mg by mouth 2 (two) times daily. Patient not taking: Reported on 09/30/2021 09/30/21   [provider]  diazepam (VALIUM) 5 MG tablet Take 0.5 tablets (2.5 mg total) by  mouth at bedtime as needed for anxiety. Patient not taking: Reported on 09/30/2021 01/28/17   Angiulli, Lavon Paganini, PA-C  diphenoxylate-atropine (LOMOTIL)  2.5-0.025 MG tablet Take 1 tablet by mouth as needed for diarrhea or loose stools. Patient not taking: Reported on 09/30/2021 06/05/21   [provider]    Allergies    Motrin [ibuprofen], Statins, Amitriptyline, Eliquis [apixaban], Ezetimibe, Lisinopril, Niacin and related, Nitrofurantoin, Paxil [paroxetine], Morphine and related, Promethazine hcl, and Sulfa antibiotics  Review of Systems   Review of Systems  Physical Exam Updated Vital Signs BP (!) 121/57   Pulse 61   Temp 98 F (36.7 C) (Oral)   Resp 17   Ht 5\' 2"  (1.575 m)   Wt 59.9 kg   SpO2 100%   BMI 24.14 kg/m   Physical Exam Vitals and nursing note reviewed.  Constitutional:      General: She is not in acute distress.    Appearance: She is well-developed. She is not diaphoretic.  HENT:     Head: Normocephalic and atraumatic.  Eyes:     Conjunctiva/sclera: Conjunctivae normal.  Cardiovascular:     Rate and Rhythm: Normal rate and regular rhythm.     Heart sounds: Normal heart sounds. No murmur heard.   No friction rub. No gallop.  Pulmonary:     Effort: Pulmonary effort is normal. No respiratory distress.     Breath sounds: Normal breath sounds. No wheezing or rales.  Abdominal:     General: There is no distension.     Palpations: Abdomen is soft.     Tenderness: There is abdominal tenderness. There is no guarding.  Genitourinary:    Comments: Trace bright red blood on vaginal exam and rectal exam  Musculoskeletal:        General: Tenderness (LLE (chronic with palpation, movement)) present.     Cervical back: Normal range of motion.  Skin:    General: Skin is warm and dry.     Findings: No erythema or rash.  Neurological:     Mental Status: She is alert and oriented to person, place, and time.    ED Results / Procedures / Treatments   Labs (all labs ordered are listed, but only abnormal results are displayed) Labs Reviewed  WET PREP, GENITAL - Abnormal; Notable for the following components:       Result Value   WBC, Wet Prep HPF POC PRESENT (*)    All other components within normal limits  CBC WITH DIFFERENTIAL/PLATELET - Abnormal; Notable for the following components:   WBC 12.0 (*)    RBC 3.74 (*)    Neutro Abs 8.5 (*)    Monocytes Absolute 1.5 (*)    Abs Immature Granulocytes 0.10 (*)    All other components within normal limits  COMPREHENSIVE METABOLIC PANEL - Abnormal; Notable for the following components:   Glucose, Bld 111 (*)    BUN 36 (*)    Creatinine, Ser 1.39 (*)    GFR, Estimated 36 (*)    All other components within normal limits  URINALYSIS, ROUTINE W REFLEX MICROSCOPIC - Abnormal; Notable for the following components:   Color, Urine RED (*)    APPearance TURBID (*)    Glucose, UA   (*)    Value: TEST NOT REPORTED DUE TO COLOR INTERFERENCE OF URINE PIGMENT   Hgb urine dipstick   (*)    Value: TEST NOT REPORTED DUE TO COLOR INTERFERENCE OF URINE PIGMENT   Bilirubin Urine   (*)  Value: TEST NOT REPORTED DUE TO COLOR INTERFERENCE OF URINE PIGMENT   Ketones, ur   (*)    Value: TEST NOT REPORTED DUE TO COLOR INTERFERENCE OF URINE PIGMENT   Protein, ur   (*)    Value: TEST NOT REPORTED DUE TO COLOR INTERFERENCE OF URINE PIGMENT   Nitrite   (*)    Value: TEST NOT REPORTED DUE TO COLOR INTERFERENCE OF URINE PIGMENT   Leukocytes,Ua   (*)    Value: TEST NOT REPORTED DUE TO COLOR INTERFERENCE OF URINE PIGMENT   All other components within normal limits  URINALYSIS, MICROSCOPIC (REFLEX) - Abnormal; Notable for the following components:   Bacteria, UA RARE (*)    All other components within normal limits  URINE CULTURE  LACTIC ACID, PLASMA  PROTIME-INR  TYPE AND SCREEN  GC/CHLAMYDIA PROBE AMP ([place]) NOT AT [facility]    EKG None  Radiology CT ABDOMEN PELVIS W CONTRAST  Result Date: 09/30/2021 CLINICAL DATA:  Abdominal pain, acute nonlocalized EXAM: CT ABDOMEN AND PELVIS WITH CONTRAST TECHNIQUE: Multidetector CT imaging of the abdomen and pelvis  was performed using the standard protocol following bolus administration of intravenous contrast. CONTRAST:  63mL OMNIPAQUE IOHEXOL 350 MG/ML SOLN COMPARISON:  None. FINDINGS: Lower chest: No acute abnormality. Hepatobiliary: Multiple stable hepatic cysts. Stable hepatomegaly. No focal enhancing intrahepatic mass. No intrahepatic or extrahepatic biliary ductal dilation. Gallbladder unremarkable. Pancreas: Mildly atrophic, but otherwise unremarkable. Spleen: Unremarkable Adrenals/Urinary Tract: The adrenal glands are unremarkable. The left kidney is markedly atrophic, unchanged. Several hypodense lesions are seen within the left kidney likely representing tiny cortical cysts which are too small to accurately characterize. There is development, however, of a 14 mm exophytic lesion arising from the lower pole the left kidney which may demonstrate enhancing internal components, best appreciated on coronal image # 74/5, possibly representing a small primary renal neoplasm. The right kidney is unremarkable. The bladder is unremarkable. Stomach/Bowel: Mild sigmoid diverticulosis. The stomach, small bowel, and large bowel are otherwise unremarkable. No free intraperitoneal gas or fluid. Vascular/Lymphatic: Extensive aortoiliac atherosclerotic calcification. No aortic aneurysm. Vascular stents and bypass grafts are noted within the visualized lower extremity arterial outflow, not well characterized on this examination. No pathologic adenopathy within the abdomen and pelvis. Retroperitoneal lymph node dissection has been performed. Reproductive: Status post hysterectomy. No adnexal masses. Other: No abdominal wall hernia. Musculoskeletal: The osseous structures are diffusely osteopenic. Degenerative changes are seen within the left hip. Remote bilateral inferior pubic rami fractures are noted. No acute bone abnormality. IMPRESSION: No acute intra-abdominal pathology identified. No definite radiographic explanation of the  patient's reported symptoms. Mild sigmoid diverticulosis. Interval development of an exophytic 14 mm possibly enhancing lesion involving the lower pole the left kidney. This is not well characterized on this examination. Dedicated renal mass protocol CT or MRI examination may be more helpful for further evaluation, if clinically indicated. Aortic Atherosclerosis (ICD10-I70.0). Electronically Signed   By: Fidela Salisbury M.D.   On: 09/30/2021 23:27    Procedures Procedures   Medications Ordered in ED Medications  iohexol (OMNIPAQUE) 350 MG/ML injection 80 mL (80 mLs Intravenous Contrast Given 09/30/21 2258)    ED Course  I have reviewed the triage vital signs and the nursing notes.  Pertinent labs & imaging results that were available during my care of the patient were reviewed by me and considered in my medical decision making (see chart for details).    MDM Rules/Calculators/A&P  87yo female with history of atrial fibrillation, DVT, CVA, PVD with history of right BKA, left fem-tibial bypass, iliac stent, with velocity profile in graft concerning for bypass graft failure, critical LLE ischemia on plavix, who presents with concern for hematuria. Initially unclear where blood coming from on history--small amount on vaginal exam but no sign of intravaginal lesions or dehiscence of prior hysterectomy site or clear source of bleeding.  Had trace gross blood on rectal exam. Unclear if these are secondary to contamination from other source. Urine obtained grossly bloody, dark, suspect urinary source of bleeding.    CT abdomen pelvis with exophytic kidney lesion, no other acute abnormaliites. Recommend outpatient evaluation of this lesion.  May be contributor to bleeding but she also has large WBC, clumps of WBC and dysuria and continue to consider infection as possible etiology.  Given urinary frequency, leaking urine, concern for some clots making it difficult to urinate,  did place foley with 200cc urine return which was grossly bloody . Discussed with Urology on call.  3 way catheter placed, irrigated manually with clearance to bright pink color. Hemoglobin normal, vital signs stable, no sign of bleed to require emergent surgery. At this time, feel risk/benefit of continuing plavix in setting of concern for fem-tib bypass failure and LLE and stable hemoglobin, clearing urine, is that benefit of plavix for her leg outweighs the risk of hematuria and feel she should continue it but discussed that this may change depending on whether her hematuria improves or worsens. Recommend follow up with PCP for recheck hgb, symptoms and follow up with [facility] for evaluation of hematuria and catheter removal.  Recommend continuing antibiotic as initially prescribed and return for foley obstruction, new or worsening symptoms.    Final Clinical Impression(s) / ED Diagnoses Final diagnoses:  Gross hematuria  Lesion of left native kidney  Urinary tract infection with hematuria, site unspecified  Foley catheter in place    Rx / DC Orders ED Discharge Orders     None        Gareth Morgan, MD 10/01/21 1150    Gareth Morgan, MD 11/29/21 1145

## 2021-10-02 DIAGNOSIS — R310 Gross hematuria: Secondary | ICD-10-CM | POA: Diagnosis not present

## 2021-10-02 LAB — URINE CULTURE: Culture: <10000 — AB

## 2021-10-04 DIAGNOSIS — R310 Gross hematuria: Secondary | ICD-10-CM | POA: Diagnosis not present

## 2021-10-08 ENCOUNTER — Telehealth: Payer: Self-pay | Admitting: Internal Medicine

## 2021-10-08 NOTE — Telephone Encounter (Signed)
Spoke with Fara Chute and advised her that it was recommended for the patient to discontinue amiodarone (see phone note from 10/27)

## 2021-10-08 NOTE — Telephone Encounter (Signed)
New Message:      [name] from Dr Geraldo Docker office wants to know if patient is taking Amiodarone.

## 2021-10-09 DIAGNOSIS — E782 Mixed hyperlipidemia: Secondary | ICD-10-CM | POA: Diagnosis not present

## 2021-10-09 DIAGNOSIS — E038 Other specified hypothyroidism: Secondary | ICD-10-CM | POA: Diagnosis not present

## 2021-10-09 DIAGNOSIS — I10 Essential (primary) hypertension: Secondary | ICD-10-CM | POA: Diagnosis not present

## 2021-10-09 DIAGNOSIS — E7800 Pure hypercholesterolemia, unspecified: Secondary | ICD-10-CM | POA: Diagnosis not present

## 2021-10-09 DIAGNOSIS — K219 Gastro-esophageal reflux disease without esophagitis: Secondary | ICD-10-CM | POA: Diagnosis not present

## 2021-10-09 DIAGNOSIS — G4700 Insomnia, unspecified: Secondary | ICD-10-CM | POA: Diagnosis not present

## 2021-10-09 DIAGNOSIS — M810 Age-related osteoporosis without current pathological fracture: Secondary | ICD-10-CM | POA: Diagnosis not present

## 2021-10-09 DIAGNOSIS — M1990 Unspecified osteoarthritis, unspecified site: Secondary | ICD-10-CM | POA: Diagnosis not present

## 2021-10-09 DIAGNOSIS — M169 Osteoarthritis of hip, unspecified: Secondary | ICD-10-CM | POA: Diagnosis not present

## 2021-10-28 DIAGNOSIS — G4700 Insomnia, unspecified: Secondary | ICD-10-CM | POA: Diagnosis not present

## 2021-10-28 DIAGNOSIS — E038 Other specified hypothyroidism: Secondary | ICD-10-CM | POA: Diagnosis not present

## 2021-10-28 DIAGNOSIS — M1990 Unspecified osteoarthritis, unspecified site: Secondary | ICD-10-CM | POA: Diagnosis not present

## 2021-10-28 DIAGNOSIS — E782 Mixed hyperlipidemia: Secondary | ICD-10-CM | POA: Diagnosis not present

## 2021-10-28 DIAGNOSIS — K219 Gastro-esophageal reflux disease without esophagitis: Secondary | ICD-10-CM | POA: Diagnosis not present

## 2021-10-28 DIAGNOSIS — I10 Essential (primary) hypertension: Secondary | ICD-10-CM | POA: Diagnosis not present

## 2021-10-28 DIAGNOSIS — E7800 Pure hypercholesterolemia, unspecified: Secondary | ICD-10-CM | POA: Diagnosis not present

## 2021-10-28 DIAGNOSIS — M810 Age-related osteoporosis without current pathological fracture: Secondary | ICD-10-CM | POA: Diagnosis not present

## 2021-11-27 DIAGNOSIS — E7800 Pure hypercholesterolemia, unspecified: Secondary | ICD-10-CM | POA: Diagnosis not present

## 2021-11-27 DIAGNOSIS — K219 Gastro-esophageal reflux disease without esophagitis: Secondary | ICD-10-CM | POA: Diagnosis not present

## 2021-11-27 DIAGNOSIS — G4700 Insomnia, unspecified: Secondary | ICD-10-CM | POA: Diagnosis not present

## 2021-11-27 DIAGNOSIS — M169 Osteoarthritis of hip, unspecified: Secondary | ICD-10-CM | POA: Diagnosis not present

## 2021-11-27 DIAGNOSIS — I10 Essential (primary) hypertension: Secondary | ICD-10-CM | POA: Diagnosis not present

## 2021-11-27 DIAGNOSIS — E782 Mixed hyperlipidemia: Secondary | ICD-10-CM | POA: Diagnosis not present

## 2021-11-27 DIAGNOSIS — E038 Other specified hypothyroidism: Secondary | ICD-10-CM | POA: Diagnosis not present

## 2021-11-27 DIAGNOSIS — M810 Age-related osteoporosis without current pathological fracture: Secondary | ICD-10-CM | POA: Diagnosis not present

## 2021-12-23 ENCOUNTER — Other Ambulatory Visit: Payer: Self-pay

## 2021-12-23 ENCOUNTER — Emergency Department (HOSPITAL_BASED_OUTPATIENT_CLINIC_OR_DEPARTMENT_OTHER)
Admission: EM | Admit: 2021-12-23 | Discharge: 2021-12-23 | Disposition: A | Discharge status: Home / Self Care | Payer: Medicare Other | Attending: Emergency Medicine | Admitting: Emergency Medicine

## 2021-12-23 ENCOUNTER — Emergency Department (HOSPITAL_BASED_OUTPATIENT_CLINIC_OR_DEPARTMENT_OTHER): Payer: Medicare Other

## 2021-12-23 ENCOUNTER — Encounter (HOSPITAL_BASED_OUTPATIENT_CLINIC_OR_DEPARTMENT_OTHER): Payer: Self-pay | Admitting: Urology

## 2021-12-23 ENCOUNTER — Emergency Department (HOSPITAL_BASED_OUTPATIENT_CLINIC_OR_DEPARTMENT_OTHER): Payer: Medicare Other | Admitting: Radiology

## 2021-12-23 DIAGNOSIS — Z7901 Long term (current) use of anticoagulants: Secondary | ICD-10-CM | POA: Diagnosis not present

## 2021-12-23 DIAGNOSIS — I739 Peripheral vascular disease, unspecified: Secondary | ICD-10-CM | POA: Insufficient documentation

## 2021-12-23 DIAGNOSIS — E1122 Type 2 diabetes mellitus with diabetic chronic kidney disease: Secondary | ICD-10-CM | POA: Diagnosis not present

## 2021-12-23 DIAGNOSIS — J320 Chronic maxillary sinusitis: Secondary | ICD-10-CM

## 2021-12-23 DIAGNOSIS — I129 Hypertensive chronic kidney disease with stage 1 through stage 4 chronic kidney disease, or unspecified chronic kidney disease: Secondary | ICD-10-CM | POA: Diagnosis not present

## 2021-12-23 DIAGNOSIS — Z79899 Other long term (current) drug therapy: Secondary | ICD-10-CM | POA: Insufficient documentation

## 2021-12-23 DIAGNOSIS — R519 Headache, unspecified: Secondary | ICD-10-CM | POA: Diagnosis not present

## 2021-12-23 DIAGNOSIS — S9032XA Contusion of left foot, initial encounter: Secondary | ICD-10-CM | POA: Diagnosis not present

## 2021-12-23 DIAGNOSIS — W010XXA Fall on same level from slipping, tripping and stumbling without subsequent striking against object, initial encounter: Secondary | ICD-10-CM | POA: Insufficient documentation

## 2021-12-23 DIAGNOSIS — N189 Chronic kidney disease, unspecified: Secondary | ICD-10-CM | POA: Diagnosis not present

## 2021-12-23 DIAGNOSIS — R062 Wheezing: Secondary | ICD-10-CM | POA: Diagnosis not present

## 2021-12-23 DIAGNOSIS — U071 COVID-19: Secondary | ICD-10-CM | POA: Diagnosis not present

## 2021-12-23 DIAGNOSIS — M7989 Other specified soft tissue disorders: Secondary | ICD-10-CM | POA: Diagnosis not present

## 2021-12-23 DIAGNOSIS — I4891 Unspecified atrial fibrillation: Secondary | ICD-10-CM | POA: Insufficient documentation

## 2021-12-23 DIAGNOSIS — S99922A Unspecified injury of left foot, initial encounter: Secondary | ICD-10-CM | POA: Diagnosis present

## 2021-12-23 LAB — URINALYSIS, ROUTINE W REFLEX MICROSCOPIC
Bilirubin Urine: NEGATIVE
Glucose, UA: NEGATIVE mg/dL
Hgb urine dipstick: NEGATIVE
Ketones, ur: NEGATIVE mg/dL
Nitrite: POSITIVE — AB
Protein, ur: NEGATIVE mg/dL
Specific Gravity, Urine: 1.007 (ref 1.005–1.030)
pH: 5 (ref 5.0–8.0)

## 2021-12-23 LAB — BASIC METABOLIC PANEL
Anion gap: 8 (ref 5–15)
BUN: 18 mg/dL (ref 8–23)
CO2: 26 mmol/L (ref 22–32)
Calcium: 9.3 mg/dL (ref 8.9–10.3)
Chloride: 100 mmol/L (ref 98–111)
Creatinine, Ser: 1.36 mg/dL — ABNORMAL HIGH (ref 0.44–1.00)
GFR, Estimated: 37 mL/min — ABNORMAL LOW (ref >60)
Glucose, Bld: 108 mg/dL — ABNORMAL HIGH (ref 70–99)
Potassium: 4.2 mmol/L (ref 3.5–5.1)
Sodium: 134 mmol/L — ABNORMAL LOW (ref 135–145)

## 2021-12-23 LAB — RESP PANEL BY RT-PCR (FLU A&B, COVID) ARPGX2
Influenza A by PCR: NEGATIVE
Influenza B by PCR: NEGATIVE
SARS Coronavirus 2 by RT PCR: POSITIVE — AB

## 2021-12-23 LAB — CBC
HCT: 38.6 % (ref 36.0–46.0)
Hemoglobin: 12.2 g/dL (ref 12.0–15.0)
MCH: 30.7 pg (ref 26.0–34.0)
MCHC: 31.6 g/dL (ref 30.0–36.0)
MCV: 97.2 fL (ref 80.0–100.0)
Platelets: 163 10*3/uL (ref 150–400)
RBC: 3.97 MIL/uL (ref 3.87–5.11)
RDW: 14 % (ref 11.5–15.5)
WBC: 6.3 10*3/uL (ref 4.0–10.5)
nRBC: 0 % (ref 0.0–0.2)

## 2021-12-23 MED ORDER — ONDANSETRON HCL 4 MG/2ML IJ SOLN
4.0000 mg | Freq: Once | INTRAMUSCULAR | Status: AC
  Administered 2021-12-23: 4 mg via INTRAVENOUS
  Filled 2021-12-23: qty 2

## 2021-12-23 MED ORDER — AMOXICILLIN-POT CLAVULANATE 500-125 MG PO TABS: 500.0000 mg | ORAL_TABLET | Freq: Two times a day (BID) | ORAL | 0 refills | Status: AC

## 2021-12-23 MED ORDER — SODIUM CHLORIDE 0.9 % IV BOLUS
500.0000 mL | Freq: Once | INTRAVENOUS | Status: AC
Start: 2021-12-23 — End: 2021-12-23
  Administered 2021-12-23: 500 mL via INTRAVENOUS

## 2021-12-23 MED ORDER — SODIUM CHLORIDE 0.9 % IV SOLN
1.0000 g | Freq: Once | INTRAVENOUS | Status: AC
  Administered 2021-12-23: 1 g via INTRAVENOUS
  Filled 2021-12-23: qty 10

## 2021-12-23 NOTE — Discharge Instructions (Addendum)
Please take antibiotics as prescribed Drink plenty of fluids Take probiotics and eat yogurt as tolerated Please call ear nose and throat for follow-up. If you are unable to get into them, please follow-up with Dr. Inda Merlin as soon as possible Return to the emergency department if you are having worsening symptoms especially fever or worsening face pain or headache.

## 2021-12-23 NOTE — ED Triage Notes (Signed)
Slipped from slide board while transferring from recliner to wheelchair , fell onto floor Not feeling well since yesterday  Denies any injury from fall, denies hitting head +Plavix  Denies any trouble urinated, but states feeling weak since yesterday

## 2021-12-23 NOTE — ED Provider Notes (Signed)
[facility] Provider Note   CSN: [id number] Arrival date & time: 12/23/21  1141     History  Chief Complaint  Patient presents with   Fall   Weakness    [name] is a 88 y.o. female.  HPI 88 year old female here for history of peripheral vascular disease, status post right BKA, hypertension, anemia, GERD, hypercholesterolemia, DVT, stroke, A-fib, presents today with generalized weakness and pain in left foot after fall.  Additionally, she has had some face and head pain.  Patient states that she has not felt well for the past 2 days.  She has not been eating or drinking as well as usual.  She normally transfers herself from her wheelchair with a board.  She was weaker than usual day went to transfer and dropped the board onto her left foot.  She has some pain and swelling on the dorsal aspect of the left foot.  She denies other injury.  She denies fever, vision changes, neck pain, chest pain, dyspnea, abdominal pain or other extremity injury.  She does have some headache.  She is on Plavix.  Daughter is at the bedside and is providing additional history.  She has noted some swelling below her eyes feels that there is more swelling on the left than on the right.  Her upper oral dentition is all dentures she has poor dentition on the lower jaw.  She has not noted any new tooth swelling or pain.     Home Medications Prior to Admission medications   Medication Sig Start Date End Date Taking? Authorizing Provider  amoxicillin-clavulanate (AUGMENTIN) 500-125 MG tablet Take 1 tablet (500 mg total) by mouth 2 (two) times daily for 7 days. 12/23/21 12/30/21 Yes Pattricia Boss, MD  acetaminophen (TYLENOL) 500 MG tablet Take 1,000 mg by mouth every 8 (eight) hours as needed for mild pain.     [provider]  Alirocumab (PRALUENT) 150 MG/ML SOAJ Inject 150 mg into the skin every 14 (fourteen) days. 10/10/20   Belva Crome, MD  cefUROXime (CEFTIN) 250 MG  tablet Take 500 mg by mouth 2 (two) times daily. Patient not taking: Reported on 09/30/2021 09/30/21   [provider]  clopidogrel (PLAVIX) 75 MG tablet Take 1 tablet (75 mg total) by mouth daily. 01/28/17   Angiulli, Lavon Paganini, PA-C  Cyanocobalamin (VITAMIN B-12 PO) Place 1 tablet under the tongue daily.    [provider]  diazepam (VALIUM) 5 MG tablet Take 0.5 tablets (2.5 mg total) by mouth at bedtime as needed for anxiety. Patient not taking: Reported on 09/30/2021 01/28/17   Angiulli, Lavon Paganini, PA-C  diphenoxylate-atropine (LOMOTIL) 2.5-0.025 MG tablet Take 1 tablet by mouth as needed for diarrhea or loose stools. Patient not taking: Reported on 09/30/2021 06/05/21   [provider]  furosemide (LASIX) 20 MG tablet Take 20 mg by mouth every Monday, Wednesday, and Friday.    [provider]  gabapentin (NEURONTIN) 300 MG capsule Take 300 mg by mouth See admin instructions. Take 300mg  by mouth three times daily.  May take an additional 300mg  at bedtime as needed for pain.    [provider]  isosorbide mononitrate (IMDUR) 30 MG 24 hr tablet Take 1 tablet (30 mg total) by mouth daily. 08/14/21   Werner Lean, MD  levothyroxine (SYNTHROID) 50 MCG tablet Take 50 mcg by mouth every morning. 08/07/21   [provider]  loperamide (IMODIUM) 2 MG capsule Take 2 mg by mouth See admin  instructions. Take 2mg  by mouth as needed after each loose stool.  Do not exceed 8 capsules in 24 hours.    [provider]  metoprolol tartrate (LOPRESSOR) 25 MG tablet Take 1 tablet (25 mg total) by mouth at bedtime. Patient taking differently: Take 25 mg by mouth 2 (two) times daily. 01/28/17   Angiulli, Lavon Paganini, PA-C  Multiple Vitamins-Minerals (IMMUNE SUPPORT VITAMIN C PO) Take 1 tablet by mouth 2 (two) times daily.     [provider]  Multiple Vitamins-Minerals (PRESERVISION AREDS PO) Take 1 capsule by mouth in the morning and at bedtime.     [provider]  oxycodone (OXY-IR) 5 MG capsule Take 1 capsule (5 mg total) by mouth every 6 (six) hours as needed for pain. 05/06/20   Dagoberto Ligas, PA-C  potassium chloride (K-DUR,KLOR-CON) 10 MEQ tablet Take 10 mEq by mouth daily.    [provider]  valsartan-hydrochlorothiazide (DIOVAN-HCT) 160-12.5 MG tablet Take 0.5 tablets by mouth daily. 01/28/17   Angiulli, Lavon Paganini, PA-C  VITAMIN D PO Take 1,000 Units by mouth daily.    [provider]      Allergies    Motrin [ibuprofen], Statins, Amitriptyline, Eliquis [apixaban], Ezetimibe, Lisinopril, Niacin and related, Nitrofurantoin, Paxil [paroxetine], Morphine and related, Promethazine hcl, and Sulfa antibiotics    Review of Systems   Review of Systems  All other systems reviewed and are negative.  Physical Exam Updated Vital Signs BP (!) 170/144    Pulse 76    Temp 98.7 F (37.1 C)    Resp (!) 21    Ht 1.575 m (5\' 2" )    Wt 59.9 kg    SpO2 97%    BMI 24.15 kg/m  Physical Exam Vitals and nursing note reviewed.  Constitutional:      Appearance: Normal appearance.  HENT:     Head: Normocephalic.     Right Ear: External ear normal.     Left Ear: External ear normal.     Nose: Nose normal.     Mouth/Throat:     Mouth: Mucous membranes are moist.     Pharynx: Oropharynx is clear.  Cardiovascular:     Rate and Rhythm: Regular rhythm.     Heart sounds: Normal heart sounds.     Comments: Patient has right BKA Left foot appears contused Daughter reports it is always difficult to get a pulse in the left foot.  The toes are pink and with capillary refill is intact Pulmonary:     Effort: Pulmonary effort is normal.     Breath sounds: Normal breath sounds.  Abdominal:     General: Abdomen is flat.     Palpations: Abdomen is soft.  Musculoskeletal:        General: Tenderness and signs of injury present.     Cervical back: Normal range of motion and neck supple.  Skin:    General: Skin is warm and dry.      Capillary Refill: Capillary refill takes less than 2 seconds.  Neurological:     Mental Status: She is alert.  Psychiatric:        Mood and Affect: Mood normal.    ED Results / Procedures / Treatments   Labs (all labs ordered are listed, but only abnormal results are displayed) Labs Reviewed  RESP PANEL BY RT-PCR (FLU A&B, COVID) ARPGX2 - Abnormal; Notable for the following components:      Result Value   SARS Coronavirus 2 by RT PCR POSITIVE (*)  All other components within normal limits  BASIC METABOLIC PANEL - Abnormal; Notable for the following components:   Sodium 134 (*)    Glucose, Bld 108 (*)    Creatinine, Ser 1.36 (*)    GFR, Estimated 37 (*)    All other components within normal limits  URINALYSIS, ROUTINE W REFLEX MICROSCOPIC - Abnormal; Notable for the following components:   Color, Urine COLORLESS (*)    Nitrite POSITIVE (*)    Leukocytes,Ua LARGE (*)    Bacteria, UA RARE (*)    All other components within normal limits  URINE CULTURE  CBC    EKG None  Radiology CT Head Wo Contrast  Result Date: 12/23/2021 CLINICAL DATA:  Fall, facial pain EXAM: CT HEAD WITHOUT CONTRAST TECHNIQUE: Contiguous axial images were obtained from the base of the skull through the vertex without intravenous contrast. RADIATION DOSE REDUCTION: This exam was performed according to the departmental dose-optimization program which includes automated exposure control, adjustment of the mA and/or kV according to patient size and/or use of iterative reconstruction technique. COMPARISON:  02/11/2009 FINDINGS: Brain: No acute intracranial findings are seen. Cortical sulci are prominent with interval progression. There is decreased density in the periventricular white matter. Vascular: Unremarkable. Skull: No fracture is seen in calvarium. Sinuses/Orbits: There is almost complete opacification of left maxillary sinus. There is medial bulging of medial wall of left maxillary sinus, possibly  suggesting mucocele. Density measurements in the fluid density in the left maxillary sinus is higher than usual for serous fluid collection. There is coarse calcification within the left maxillary sinus. There is fluid density in the left frontal and ethmoid sinuses. Other: None. IMPRESSION: No acute intracranial findings are seen in noncontrast CT brain. Atrophy. Small-vessel disease. Chronic frontal and ethmoid sinusitis. There is high density fluid and calcification within the left maxillary sinus, possibly suggesting chronic fungal sinusitis. There is medial bulging of medial wall of left maxillary sinus which may be due to trauma or suggest mucocele. Electronically Signed   By: Elmer Picker M.D.   On: 12/23/2021 15:26   DG Chest Port 1 View  Result Date: 12/23/2021 CLINICAL DATA:  Wheezing EXAM: PORTABLE CHEST 1 VIEW COMPARISON:  08/14/2021 FINDINGS: Unchanged cardiomediastinal silhouette. Right hemidiaphragm eventration. Chronic interstitial parenchymal changes. No focal airspace consolidation. No large pleural effusion. No pneumothorax. Right anatomic shoulder arthroplasty. Partially visualized left distal humerus hardware. No acute osseous abnormality. IMPRESSION: Chronic parenchymal changes.  No focal airspace disease. Electronically Signed   By: Maurine Simmering M.D.   On: 12/23/2021 15:36   DG Foot Complete Left  Result Date: 12/23/2021 CLINICAL DATA:  trauma-bruising left foot EXAM: LEFT FOOT - COMPLETE 3+ VIEW COMPARISON:  None. FINDINGS: Chronic pes cavus with metatarsus adductus with medial subluxation of the navicular on the talus and hyperextension at multiple MTP joints, progressed since prior radiograph in 2018. There is no evidence of acute fracture. Diffuse osteopenia. Hyperextension at multiple MTP joints. IMPRESSION: No evidence of acute fracture.  Soft tissue swelling of the foot. Electronically Signed   By: Maurine Simmering M.D.   On: 12/23/2021 15:42   CT Maxillofacial Wo  Contrast  Result Date: 12/23/2021 CLINICAL DATA:  facial pain and pressure bilateral EXAM: CT MAXILLOFACIAL WITHOUT CONTRAST TECHNIQUE: Multidetector CT imaging of the maxillofacial structures was performed. Multiplanar CT image reconstructions were also generated. RADIATION DOSE REDUCTION: This exam was performed according to the departmental dose-optimization program which includes automated exposure control, adjustment of the mA and/or kV according to patient size and/or  use of iterative reconstruction technique. COMPARISON:  None. FINDINGS: Osseous: There is no acute facial fracture. There is mild bilateral TMJ osteoarthritis. Multiple missing teeth. Edentulous maxilla. Orbits: Negative. No traumatic or inflammatory finding. Sinuses: There is opacification of the left maxillary sinus, left anterior ethmoid air cells and near complete opacification of the left frontal sinus. Minimal mucosal thickening in the right frontal sinus and right anterior ethmoid air cells. There is chronic bony remodeling of the left maxillary sinus. There is some hyperdense material and focal ossification within the left maxillary sinus. This ossification was present in 2010 but more posteriorly located within the sinus, likely migrated along with mucosal thickening and sinus contents. Soft tissues: No superficial fluid collection. Limited intracranial: See separately dictated head CT. IMPRESSION: Chronic left maxillary sinus disease with bony remodeling, completely opacified with hyperdense material which could represent inspissated secretions or fungal colonization. Near complete opacification of the left anterior ethmoid air cells and frontal sinus. Correlate for symptoms of sinusitis. No acute facial fracture. Electronically Signed   By: Maurine Simmering M.D.   On: 12/23/2021 15:34    Procedures Procedures    Medications Ordered in ED Medications  cefTRIAXone (ROCEPHIN) 1 g in sodium chloride 0.9 % 100 mL IVPB (1 g Intravenous  New Bag/Given 12/23/21 1541)  sodium chloride 0.9 % bolus 500 mL (500 mLs Intravenous New Bag/Given 12/23/21 1448)  ondansetron (ZOFRAN) injection 4 mg (4 mg Intravenous Given 12/23/21 1451)    ED Course/ Medical Decision Making/ A&P Clinical Course as of 12/23/21 1555  Mon Dec 23, 2021  1517 Urinalysis obtained with nitrite positive and large LA but rare bacteria with 21-50 white blood cells and 0-5 red blood cells Plan culture and will give 1 g of Rocephin [DR]  1550 CT scan consistent with sinusitis. Reviewed medications and plan Augmentin 500 mg twice daily for 7 days. Discussed this with patient and her daughter and addressed concerns regarding diarrhea.  She will begin Imodium and consider probiotics and yogurt. Plan referral to ENT [DR]    Clinical Course User Index [DR] Pattricia Boss, MD                           Medical Decision Making 88 year old female presents today complaining of generalized weakness and facial pain. T obtained consistent with sinusitis Labs otherwise appear to be generally within normal limits although she has some decreased renal function consistent with her age. Patient given 1 g of Rocephin here in the ED.  And will give Augmentin 500 mg p.o. twice daily for the next 7 days. Additionally patient dropped transfer board on her left foot.  There is a contusion but no evidence of fracture.  Amount and/or Complexity of Data Reviewed Labs: ordered. Decision-making details documented in ED Course. Radiology: ordered. Decision-making details documented in ED Course.  Risk Prescription drug management. Decision regarding hospitalization. Risk Details: 88 year old female who is in assisted living.  She is not feeling well and appears to have sinusitis on her head CT.  Discussed decision to discharge versus hospitalization with patient and her daughter.  I feel that it would be preferable for the patient to continue at her current living situation.  Her vital signs  appear to be stable although mildly hypertensive.  Her labs all appear within normal limits.  We have discussed return precaution and will side effects of her medication. Plan referral to ENT for follow-up  Final Clinical Impression(s) / ED Diagnoses Final diagnoses:  Maxillary sinusitis, unspecified chronicity  Contusion of left foot, initial encounter  PVD (peripheral vascular disease) ([place])  Chronic kidney disease, unspecified CKD stage    Rx / DC Orders ED Discharge Orders          Ordered    amoxicillin-clavulanate (AUGMENTIN) 500-125 MG tablet  2 times daily        12/23/21 1554              Pattricia Boss, MD 12/23/21 1555

## 2021-12-23 NOTE — ED Notes (Signed)
Present for multiple complains.  States been having pain to left side face going down neck onset over a month.  States fell yesterday and complains of pain to left ankle.  Bruising noted will weak pedal pulses.  Daughter reports patient appears more confused than usual.  Answer questions approbately but delayed.

## 2021-12-26 LAB — URINE CULTURE: Culture: >=100000 — AB

## 2021-12-27 ENCOUNTER — Telehealth: Payer: Self-pay | Admitting: *Deleted

## 2021-12-27 NOTE — Telephone Encounter (Signed)
Post ED Visit - Positive Culture Follow-up  Culture report reviewed by antimicrobial stewardship pharmacist: [facility] Team []  Elenor Quinones, Pharm.D. []  Heide Guile, Pharm.D., BCPS AQ-ID []  Parks Neptune, Pharm.D., BCPS []  Alycia Rossetti, Pharm.D., BCPS []  [place], Pharm.D., BCPS, AAHIVP []  Legrand Como, Pharm.D., BCPS, AAHIVP []  Salome Arnt, PharmD, BCPS []  Johnnette Gourd, PharmD, BCPS []  Hughes Better, PharmD, BCPS []  Leeroy Cha, PharmD []  Laqueta Linden, PharmD, BCPS []  Albertina Parr, PharmD  [facility] Team []  Leodis Sias, PharmD []  Lindell Spar, PharmD []  Royetta Asal, PharmD []  Graylin Shiver, Rph []  Rema Fendt) Glennon Mac, PharmD []  Arlyn Dunning, PharmD []  Netta Cedars, PharmD []  Dia Sitter, PharmD []  Leone Haven, PharmD []  Gretta Arab, PharmD []  Theodis Shove, PharmD []  Peggyann Juba, PharmD []  Reuel Boom, PharmD   Positive urine culture Treated with Amoxicillin-Pot Clavulanate, organism sensitive to the same and no further patient follow-up is required at this time. Bertis Ruddy, PharmD  Harlon Flor Talley 12/27/2021, 8:39 AM

## 2021-12-30 ENCOUNTER — Other Ambulatory Visit: Payer: Self-pay | Admitting: Internal Medicine

## 2021-12-30 ENCOUNTER — Ambulatory Visit
Admission: RE | Admit: 2021-12-30 | Discharge: 2021-12-30 | Disposition: A | Discharge status: Home / Self Care | Payer: Medicare Other | Source: Ambulatory Visit | Attending: Internal Medicine | Admitting: Internal Medicine

## 2021-12-30 ENCOUNTER — Other Ambulatory Visit: Payer: Self-pay

## 2021-12-30 DIAGNOSIS — R112 Nausea with vomiting, unspecified: Secondary | ICD-10-CM | POA: Diagnosis not present

## 2021-12-30 DIAGNOSIS — J189 Pneumonia, unspecified organism: Secondary | ICD-10-CM | POA: Diagnosis not present

## 2021-12-30 DIAGNOSIS — R058 Other specified cough: Secondary | ICD-10-CM

## 2021-12-30 DIAGNOSIS — J329 Chronic sinusitis, unspecified: Secondary | ICD-10-CM | POA: Diagnosis not present

## 2021-12-30 DIAGNOSIS — R197 Diarrhea, unspecified: Secondary | ICD-10-CM | POA: Diagnosis not present

## 2021-12-30 DIAGNOSIS — R059 Cough, unspecified: Secondary | ICD-10-CM | POA: Diagnosis not present

## 2021-12-30 DIAGNOSIS — E7800 Pure hypercholesterolemia, unspecified: Secondary | ICD-10-CM | POA: Diagnosis not present

## 2021-12-31 DIAGNOSIS — R197 Diarrhea, unspecified: Secondary | ICD-10-CM | POA: Diagnosis not present

## 2022-01-06 NOTE — Progress Notes (Signed)
Cardiology Office Note:    Date:  01/07/2022   ID:  [name], DOB 06-05-1928, MRN [id number]  PCP:  Josetta Huddle, MD  Cardiologist:  Sinclair Grooms, MD   Referring MD: Josetta Huddle, MD   Chief Complaint  Patient presents with   Atrial Fibrillation   Follow-up    GI bleeding Off anticoagulation Off amiodarone    History of Present Illness:    [name] is a 88 y.o. female with a hx of  paroxysmal atrial fibrillation, history of lower GI bleed therefore anticoagulation discontinued, HTN, HLD, PVD s/p right BKA in 2018, Fem-pop bypass of LLE, who presented with LLE ischemia now s/p LLE thrombectomy of her graft, CVA/TIA, GERD. Recent chest pain when last seen by Dr. Gasper Sells, isosorbide was started.  Developed shortness of breath leading to discontinuation of amiodarone.   She is recently had urinary tract infection, pneumonia, and antibiotic induced diarrhea related to treating the urinary tract infection.  She is now doing better.  She has occasional chest heaviness.  Can tell that the low-dose of isosorbide mononitrate has brought about any improvement.  It is not really a frequent occurrence.  When it does occur, it is associated with lying down at night for sleep and usually in the setting of having a long day causing her to have fatigue.  Past Medical History:  Diagnosis Date   Anemia    Anginal pain ([facility])    Arthritis    "qwhere" (09/05/2016)   Atrial fibrillation ([place]) 09/2016   Chronic lower back pain    Complication of anesthesia    "takes a long time for it to wear off; I can hallucinate if I take too much" (09/05/2016)   DVT (deep venous thrombosis) ([place]) 10/2009   Fall from steps 08/31/2013   Fx. pelvis, Left Hip, Left Elbow   Fibromyalgia    GERD (gastroesophageal reflux disease)    09/22/16- "no too much anymore"   GI bleed 10/24/2016   High cholesterol    History of blood transfusion    History of hiatal hernia    Hypertension     Macular degeneration, wet ([place])    "started in right eye; now legally blind in that eye; now started in left eye but pretty much in control" (09/05/2016)   Osteoporosis    Peripheral vascular disease ([place])    nonviable tissue Right foot   PONV (postoperative nausea and vomiting)    Squamous cell carcinoma of skin of right calf Aug. 2015   Stroke [facility])    TIA's no residual   TIA (transient ischemic attack)    "several at once; none in a long time" (09/05/2016)    Past Surgical History:  Procedure Laterality Date   ABDOMINAL AORTAGRAM N/A 12/26/2014   Procedure: ABDOMINAL Maxcine Ham;  Surgeon: Serafina Mitchell, MD;  Location: [facility] CATH LAB;  Service: Cardiovascular;  Laterality: N/A;   AMPUTATION Right 12/23/2016   Procedure: AMPUTATION BELOW KNEE;  Surgeon: Serafina Mitchell, MD;  Location: [place];  Service: Vascular;  Laterality: Right;   AORTOGRAM N/A 09/26/2016   Procedure: AORTOGRAM;  Surgeon: Waynetta Sandy, MD;  Location: [place];  Service: Vascular;  Laterality: N/A;   CARPAL TUNNEL RELEASE Right    CATARACT EXTRACTION W/ INTRAOCULAR LENS  IMPLANT, BILATERAL Bilateral    COLONOSCOPY     DILATION AND CURETTAGE OF UTERUS     DRESSING CHANGE UNDER ANESTHESIA Right 01/16/2017   Procedure: DRESSING CHANGE RIGHT BELOW KNEE AMPUTATION;  Surgeon: Serafina Mitchell, MD;  Location: [place];  Service: Vascular;  Laterality: Right;   EYE SURGERY Right    "macular OR"   FEMORAL ARTERY STENT  12-11-10   Left SFA   FEMORAL-POPLITEAL BYPASS GRAFT Left 09/24/2016   Procedure: REDO FEMORAL TO POPLITEAL ARTERY BYPASS GRAFT USING 6MM PROPATEN RINGED GORTEX GRAFT;  Surgeon: Serafina Mitchell, MD;  [facility]: [facility] OR;  Service: Vascular;  Laterality: Left;   FEMORAL-TIBIAL BYPASS GRAFT Left 01/16/2017   Procedure: REDO BYPASS GRAFT FEMORAL-TIBIAL ARTERY WITH GORTEX GRAFT;  Surgeon: Serafina Mitchell, MD;  Location: [place];  Service: Vascular;  Laterality: Left;  AND LOWER LEG    I & D EXTREMITY Right 12/17/2016    Procedure: IRRIGATION AND DEBRIDEMENT RIGHT FOOT;  Surgeon: Serafina Mitchell, MD;  Location: [place];  Service: Vascular;  Laterality: Right;   INCISION AND DRAINAGE OF WOUND Left 10/25/2009   leg/notes 11/13/2009   INSERTION OF ILIAC STENT Left 12/26/2014   Procedure: INSERTION OF ILIAC STENT;  Surgeon: Serafina Mitchell, MD;  Location: [place] CATH LAB;  Service: Cardiovascular;  Laterality: Left;   INSERTION OF ILIAC STENT Left 09/26/2016   Procedure: SUB INTIMAL INSERTION OF SUPERFICIAL FEMORAL ARTERY AND BELOW KNEE BYPASS GRAFT;  Surgeon: Waynetta Sandy, MD;  Location: [place];  Service: Vascular;  Laterality: Left;   INSERTION OF ILIAC STENT Left 05/04/2020   Procedure: INSERTION OF SUPERFICIAL FEMORAL ARTERY BYPASS GRAFT STENT;  Surgeon: Serafina Mitchell, MD;  Location: [place];  Service: Vascular;  Laterality: Left;   JOINT REPLACEMENT     knee   JOINT REPLACEMENT Left Oct. 17, 2014   Elbow ( pt fell 08-31-13 )   LOWER EXTREMITY ANGIOGRAM Left 12/27/2019   Procedure: Intraoperative Left Lower Extremity Angiogram;  Surgeon: Angelia Mould, MD;  Location: [place];  Service: Vascular;  Laterality: Left;   LOWER EXTREMITY ANGIOGRAPHY N/A 09/21/2018   Procedure: LOWER EXTREMITY ANGIOGRAPHY;  Surgeon: Serafina Mitchell, MD;  Location: [place] CV LAB;  Service: Cardiovascular;  Laterality: N/A;   ORIF SHOULDER FRACTURE Right    "it was crushed"   PATCH ANGIOPLASTY Left 05/04/2020   Procedure: BALLOON  ANGIOPLASTY;  Surgeon: Serafina Mitchell, MD;  [facility];  Service: Vascular;  Laterality: Left;   PERIPHERAL VASCULAR CATHETERIZATION N/A 10/30/2015   Procedure: Abdominal Aortogram w/Lower Extremity;  Surgeon: Serafina Mitchell, MD;  Location: [place] CV LAB;  Service: Cardiovascular;  Laterality: N/A;   PERIPHERAL VASCULAR CATHETERIZATION  10/30/2015   Procedure: Peripheral Vascular Intervention;  Surgeon: Serafina Mitchell, MD;  Location: [place] CV LAB;  Service: Cardiovascular;;    PERIPHERAL VASCULAR CATHETERIZATION N/A 04/01/2016   Procedure: Abdominal Aortogram w/Lower Extremity;  Surgeon: Serafina Mitchell, MD;  Location: [place] CV LAB;  Service: Cardiovascular;  Laterality: N/A;   PERIPHERAL VASCULAR CATHETERIZATION Left 04/01/2016   Procedure: Peripheral Vascular Atherectomy;  Surgeon: Serafina Mitchell, MD;  Location: [place] CV LAB;  Service: Cardiovascular;  Laterality: Left;  Superficial femoral artery.   PERIPHERAL VASCULAR CATHETERIZATION Right 09/05/2016   "stent"   PERIPHERAL VASCULAR CATHETERIZATION N/A 09/05/2016   Procedure: Abdominal Aortogram w/Lower Extremity;  Surgeon: Serafina Mitchell, MD;  Location: [place] CV LAB;  Service: Cardiovascular;  Laterality: N/A;   PERIPHERAL VASCULAR CATHETERIZATION Right 09/05/2016   Procedure: Peripheral Vascular Intervention;  Surgeon: Serafina Mitchell, MD;  Location: [place] CV LAB;  Service: Cardiovascular;  Laterality: Right;  Superficial Femoral   PERIPHERAL  VASCULAR CATHETERIZATION Left 09/09/2016   Procedure: Lower Extremity Angiography;  Surgeon: Serafina Mitchell, MD;  Location: [place] CV LAB;  Service: Cardiovascular;  Laterality: Left;   PR VEIN BYPASS GRAFT,AORTO-FEM-POP  09-13-09   Left Fem-pop   THROMBECTOMY FEMORAL ARTERY Right 09/26/2016   Procedure: Thromboembolectomy Right Lower Extremity, Right Femoral Artery Endarterectomy with Patch Angioplasty; Right Lower Extremity Angiogram ;  Surgeon: Waynetta Sandy, MD;  Location: [place];  Service: Vascular;  Laterality: Right;   THROMBECTOMY FEMORAL ARTERY Left 12/27/2019   Procedure: Redo left femoral artery exposure, Thrombectomy of left deep femoral artery to anterior tibial artery bypass;  Surgeon: Angelia Mould, MD;  Location: [facility] OR;  Service: Vascular;  Laterality: Left;   THROMBECTOMY FEMORAL ARTERY Left 05/04/2020   Procedure: THROMBECTOMY LEFT LEG;  Surgeon: Serafina Mitchell, MD;  Location: [place] OR;  Service: Vascular;   Laterality: Left;   TOTAL ELBOW ARTHROPLASTY Left 09/03/2013   Procedure: LEFT TOTAL ELBOW ARTHROPLASTY;  Surgeon: Roseanne Kaufman, MD;  Location: [place];  Service: Orthopedics;  Laterality: Left;   TOTAL KNEE ARTHROPLASTY Left 06/2006   TRANSMETATARSAL AMPUTATION Right 12/11/2016   Procedure: TRANSMETATARSAL AMPUTATION;  Surgeon: Serafina Mitchell, MD;  [facility] OR;  Service: Vascular;  Laterality: Right;   TUBAL LIGATION     VAGINAL HYSTERECTOMY      Current Medications: Current Meds  Medication Sig   acetaminophen (TYLENOL) 500 MG tablet Take 1,000 mg by mouth every 8 (eight) hours as needed for mild pain.    cefUROXime (CEFTIN) 250 MG tablet Take 500 mg by mouth 2 (two) times daily.   clopidogrel (PLAVIX) 75 MG tablet Take 1 tablet (75 mg total) by mouth daily.   Cyanocobalamin (VITAMIN B-12 PO) Place 1 tablet under the tongue daily.   diazepam (VALIUM) 5 MG tablet Take 0.5 tablets (2.5 mg total) by mouth at bedtime as needed for anxiety.   diphenoxylate-atropine (LOMOTIL) 2.5-0.025 MG tablet Take 1 tablet by mouth as needed for diarrhea or loose stools.   furosemide (LASIX) 20 MG tablet Take 20 mg by mouth every Monday, Wednesday, and Friday.   gabapentin (NEURONTIN) 300 MG capsule Take 300 mg by mouth See admin instructions. Take 300mg  by mouth three times daily.  May take an additional 300mg  at bedtime as needed for pain.   isosorbide mononitrate (IMDUR) 30 MG 24 hr tablet Take 1 tablet (30 mg total) by mouth daily.   levothyroxine (SYNTHROID) 50 MCG tablet Take 50 mcg by mouth every morning.   loperamide (IMODIUM) 2 MG capsule Take 2 mg by mouth See admin instructions. Take 2mg  by mouth as needed after each loose stool.  Do not exceed 8 capsules in 24 hours.   metoprolol tartrate (LOPRESSOR) 25 MG tablet Take 1 tablet (25 mg total) by mouth at bedtime. (Patient taking differently: Take 25 mg by mouth 2 (two) times daily.)   Multiple Vitamins-Minerals (IMMUNE SUPPORT VITAMIN C PO) Take  1 tablet by mouth 2 (two) times daily.    Multiple Vitamins-Minerals (PRESERVISION AREDS PO) Take 1 capsule by mouth in the morning and at bedtime.   oxycodone (OXY-IR) 5 MG capsule Take 1 capsule (5 mg total) by mouth every 6 (six) hours as needed for pain.   potassium chloride (K-DUR,KLOR-CON) 10 MEQ tablet Take 10 mEq by mouth daily.   valsartan-hydrochlorothiazide (DIOVAN-HCT) 160-12.5 MG tablet Take 0.5 tablets by mouth daily.   VITAMIN D PO Take 1,000 Units by mouth daily.   [DISCONTINUED] Alirocumab (PRALUENT) 150 MG/ML  SOAJ Inject 150 mg into the skin every 14 (fourteen) days.     Allergies:   Motrin [ibuprofen], Statins, Amitriptyline, Eliquis [apixaban], Ezetimibe, Lisinopril, Lisinopril-hydrochlorothiazide, Niacin and related, Nitrofurantoin, Paxil [paroxetine], Morphine and related, Promethazine hcl, and Sulfa antibiotics   Social History   Socioeconomic History   Marital status: Widowed    Spouse name: Not on file   Number of children: Not on file   Years of education: Not on file   Highest education level: Not on file  Occupational History   Not on file  Tobacco Use   Smoking status: Former    Types: Cigarettes    Quit date: 11/17/1946    Years since quitting: 75.1   Smokeless tobacco: Never   Tobacco comments:    "never smoked much"  Vaping Use   Vaping Use: Never used  Substance and Sexual Activity   Alcohol use: No    Alcohol/week: 0.0 standard drinks   Drug use: No   Sexual activity: Not on file  Other Topics Concern   Not on file  Social History Narrative   Not on file   Social Determinants of Health   Financial Resource Strain: Not on file  Food Insecurity: Not on file  Transportation Needs: Not on file  Physical Activity: Not on file  Stress: Not on file  Social Connections: Not on file     Family History: The patient's family history includes Alcohol abuse in her father; Deep vein thrombosis in her brother; Diabetes in her son; Heart attack in  her son; Heart disease in her brother, father, and son; Hyperlipidemia in her brother, father, sister, and son; Hypertension in her brother, father, sister, son, and son.  ROS:   Please see the history of present illness.    No recent blood in her urine or stool.  Diarrhea has resolved.  All other systems reviewed and are negative.  EKGs/Labs/Other Studies Reviewed:    The following studies were reviewed today: No new cardiac evaluation.  EKG:  EKG no new tracing since 12/25/2021.  That tracing demonstrated normal sinus rhythm with interventricular conduction delay.  Recent Labs: 09/30/2021: ALT 12 12/23/2021: BUN 18; Creatinine, Ser 1.36; Hemoglobin 12.2; Platelets 163; Potassium 4.2; Sodium 134  Recent Lipid Panel    Component Value Date/Time   CHOL 190 01/24/2021 1106   TRIG 335 (H) 01/24/2021 1106   HDL 36 (L) 01/24/2021 1106   CHOLHDL 5.3 (H) 01/24/2021 1106   CHOLHDL 6.5 02/11/2009 1850   VLDL 68 (H) 02/11/2009 1850   LDLCALC 98 01/24/2021 1106   LDLDIRECT 226 (H) 04/20/2020 1116    Physical Exam:    VS:  BP 130/62    Pulse 65    Ht 5\' 2"  (1.575 m)    Wt 132 lb (59.9 kg)    BMI 24.14 kg/m     Wt Readings from Last 3 Encounters:  01/07/22 132 lb (59.9 kg)  12/23/21 132 lb 0.9 oz (59.9 kg)  09/30/21 132 lb (59.9 kg)     GEN: Compatible with age.  Obvious frailty.. No acute distress HEENT: Normal NECK: No JVD. LYMPHATICS: No lymphadenopathy CARDIAC: 1/6 to 2/6 mitral regurgitation systolic murmur. RRR no gallop, or edema. VASCULAR:  Normal Pulses. No bruits. RESPIRATORY:  Clear to auscultation without rales, wheezing or rhonchi  ABDOMEN: Soft, non-tender, non-distended, No pulsatile mass, MUSCULOSKELETAL: No deformity  SKIN: Warm and dry NEUROLOGIC:  Alert and oriented x 3 PSYCHIATRIC:  Normal affect   ASSESSMENT:    1. 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 Paroxysmal atrial fibrillation ([place])   3.  Essential hypertension   4. History of CVA (cerebrovascular accident)   5. Hyperlipidemia, unspecified hyperlipidemia type    PLAN:    In order of problems listed above:  Continue observation for potential angina.  She is on low-dose isosorbide.  Cannot tell that it is making any difference in the rare episodes of chest tightness.  We will continue for now but at some point in the future this medicine can be sacrificed if need be. No clinical recurrence.  We will on suppressive amiodarone but the question of whether respiratory complaints or related to amiodarone led to clinical discontinuation.  No recurrence of atrial fibrillation have arisen since discontinuation in October. Blood pressure is better than well-controlled at her age.  Continue furosemide, metoprolol, isosorbide. No new neurological complaints Continue Praluent  Conservative management of all of her cardiovascular issues.  Very frail with high likelihood of complications from long-term anticoagulation.  Clinical follow-up 9 to 12 months.    Medication Adjustments/Labs and Tests Ordered: Current medicines are reviewed at length with the patient today.  Concerns regarding medicines are outlined above.  No orders of the defined types were placed in this encounter.  Meds ordered this encounter  Medications   Alirocumab (PRALUENT) 150 MG/ML SOAJ    Sig: Inject 150 mg into the skin every 14 (fourteen) days.    Dispense:  2 mL    Refill:  11    Patient Instructions  Medication Instructions:  Your physician recommends that you continue on your current medications as directed. Please refer to the Current Medication list given to you today.  *If you need a refill on your cardiac medications before your next appointment, please call your pharmacy*   Lab Work: NONE If you have labs (blood work) drawn today and your tests are completely normal, you will receive your results only by: [place] (if you have MyChart)  OR A paper copy in the mail If you have any lab test that is abnormal or we need to change your treatment, we will call you to review the results.   Testing/Procedures: NONE   Follow-Up: At [facility], you and your health needs are our priority.  As part of our continuing mission to provide you with exceptional heart care, we have created designated Provider Care Teams.  These Care Teams include your primary Cardiologist (physician) and Advanced Practice Providers (APPs -  Physician Assistants and Nurse Practitioners) who all work together to provide you with the care you need, when you need it.  We recommend signing up for the patient portal called "MyChart".  Sign up information is provided on this After Visit Summary.  MyChart is used to connect with patients for Virtual Visits (Telemedicine).  Patients are able to view lab/test results, encounter notes, upcoming appointments, etc.  Non-urgent messages can be sent to your provider as well.   To learn more about what you can do with MyChart, go to [email or web address].    Your next appointment:   9-12 month(s)  The format for your next appointment:   In Person  Provider:   Sinclair Grooms, MD     Signed, Sinclair Grooms, MD  01/07/2022 9:23 AM    [place]

## 2022-01-07 ENCOUNTER — Encounter: Payer: Self-pay | Admitting: Interventional Cardiology

## 2022-01-07 ENCOUNTER — Other Ambulatory Visit: Payer: Self-pay

## 2022-01-07 ENCOUNTER — Ambulatory Visit (INDEPENDENT_AMBULATORY_CARE_PROVIDER_SITE_OTHER): Payer: Medicare Other | Admitting: Interventional Cardiology

## 2022-01-07 VITALS — BP 130/62 | HR 65 | Ht 62.0 in | Wt 132.0 lb

## 2022-01-07 DIAGNOSIS — E785 Hyperlipidemia, unspecified: Secondary | ICD-10-CM | POA: Diagnosis not present

## 2022-01-07 DIAGNOSIS — I480 Paroxysmal atrial fibrillation: Secondary | ICD-10-CM

## 2022-01-07 DIAGNOSIS — I70422 Atherosclerosis of autologous vein bypass graft(s) of the extremities with rest pain, left leg: Secondary | ICD-10-CM

## 2022-01-07 DIAGNOSIS — I10 Essential (primary) hypertension: Secondary | ICD-10-CM | POA: Diagnosis not present

## 2022-01-07 DIAGNOSIS — Z8673 Personal history of transient ischemic attack (TIA), and cerebral infarction without residual deficits: Secondary | ICD-10-CM

## 2022-01-07 MED ORDER — PRALUENT 150 MG/ML ~~LOC~~ SOAJ: 150.0000 mg | SUBCUTANEOUS | 11 refills | Status: DC

## 2022-01-07 NOTE — Patient Instructions (Signed)
Medication Instructions:  Your physician recommends that you continue on your current medications as directed. Please refer to the Current Medication list given to you today.  *If you need a refill on your cardiac medications before your next appointment, please call your pharmacy*   Lab Work: NONE If you have labs (blood work) drawn today and your tests are completely normal, you will receive your results only by: [place] (if you have MyChart) OR A paper copy in the mail If you have any lab test that is abnormal or we need to change your treatment, we will call you to review the results.   Testing/Procedures: NONE   Follow-Up: At [facility], you and your health needs are our priority.  As part of our continuing mission to provide you with exceptional heart care, we have created designated Provider Care Teams.  These Care Teams include your primary Cardiologist (physician) and Advanced Practice Providers (APPs -  Physician Assistants and Nurse Practitioners) who all work together to provide you with the care you need, when you need it.  We recommend signing up for the patient portal called "MyChart".  Sign up information is provided on this After Visit Summary.  MyChart is used to connect with patients for Virtual Visits (Telemedicine).  Patients are able to view lab/test results, encounter notes, upcoming appointments, etc.  Non-urgent messages can be sent to your provider as well.   To learn more about what you can do with MyChart, go to [email or web address].    Your next appointment:   9-12 month(s)  The format for your next appointment:   In Person  Provider:   Sinclair Grooms, MD

## 2022-01-09 DIAGNOSIS — R238 Other skin changes: Secondary | ICD-10-CM | POA: Diagnosis not present

## 2022-01-09 DIAGNOSIS — J189 Pneumonia, unspecified organism: Secondary | ICD-10-CM | POA: Diagnosis not present

## 2022-01-17 ENCOUNTER — Other Ambulatory Visit: Payer: Self-pay | Admitting: Interventional Cardiology

## 2022-02-14 DIAGNOSIS — E7800 Pure hypercholesterolemia, unspecified: Secondary | ICD-10-CM | POA: Diagnosis not present

## 2022-02-14 DIAGNOSIS — I10 Essential (primary) hypertension: Secondary | ICD-10-CM | POA: Diagnosis not present

## 2022-02-14 DIAGNOSIS — E038 Other specified hypothyroidism: Secondary | ICD-10-CM | POA: Diagnosis not present

## 2022-02-17 DIAGNOSIS — E0590 Thyrotoxicosis, unspecified without thyrotoxic crisis or storm: Secondary | ICD-10-CM | POA: Diagnosis not present

## 2022-02-27 DIAGNOSIS — E038 Other specified hypothyroidism: Secondary | ICD-10-CM | POA: Diagnosis not present

## 2022-02-27 DIAGNOSIS — E782 Mixed hyperlipidemia: Secondary | ICD-10-CM | POA: Diagnosis not present

## 2022-02-27 DIAGNOSIS — M810 Age-related osteoporosis without current pathological fracture: Secondary | ICD-10-CM | POA: Diagnosis not present

## 2022-02-27 DIAGNOSIS — I10 Essential (primary) hypertension: Secondary | ICD-10-CM | POA: Diagnosis not present

## 2022-03-17 ENCOUNTER — Ambulatory Visit (HOSPITAL_COMMUNITY)
Admission: RE | Admit: 2022-03-17 | Discharge: 2022-03-17 | Disposition: A | Discharge status: Home / Self Care | Payer: Medicare Other | Source: Ambulatory Visit | Attending: Surgery | Admitting: Surgery

## 2022-03-17 ENCOUNTER — Encounter: Payer: Self-pay | Admitting: Surgery

## 2022-03-17 ENCOUNTER — Ambulatory Visit (INDEPENDENT_AMBULATORY_CARE_PROVIDER_SITE_OTHER): Payer: Medicare Other | Admitting: Surgery

## 2022-03-17 ENCOUNTER — Ambulatory Visit (INDEPENDENT_AMBULATORY_CARE_PROVIDER_SITE_OTHER)
Admission: RE | Admit: 2022-03-17 | Discharge: 2022-03-17 | Disposition: A | Discharge status: Home / Self Care | Payer: Medicare Other | Source: Ambulatory Visit | Attending: Surgery | Admitting: Surgery

## 2022-03-17 VITALS — BP 159/103 | HR 73 | Temp 97.9°F | Resp 20 | Ht 62.0 in | Wt 132.0 lb

## 2022-03-17 DIAGNOSIS — I70213 Atherosclerosis of native arteries of extremities with intermittent claudication, bilateral legs: Secondary | ICD-10-CM | POA: Diagnosis not present

## 2022-03-17 NOTE — Progress Notes (Signed)
? ?[facility] ? ?Patient name: [name] MRN: [id number] DOB: 10-01-1928 Sex: female ? ? ?REASON FOR VISIT:  ? ? ?Follow-up ? ?HISOTRY OF PRESENT ILLNESS:  ? ? ?[name] is a 88 y.o. 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  She has undergone multiple attempts at limb salvage on the left leg, the most recent was on 05/04/2020.  A thrombectomy of her left femoral to anterior tibial bypass graft was performed and a Viabahn stent was placed within the proximal aspect of the femoral tibial bypass.  She is back today for follow-up.  She reports no new issues.  She does not have any open wounds. ? ? ?PAST MEDICAL HISTORY:  ? ?Past Medical History:  ?Diagnosis Date  ? Anemia   ? Anginal pain ([place])   ? Arthritis   ? "qwhere" (09/05/2016)  ? Atrial fibrillation ([place]) 09/2016  ? Chronic lower back pain   ? Complication of anesthesia   ? "takes a long time for it to wear off; I can hallucinate if I take too much" (09/05/2016)  ? DVT (deep venous thrombosis) ([place]) 10/2009  ? Fall from steps 08/31/2013  ? Fx. pelvis, Left Hip, Left Elbow  ? Fibromyalgia   ? GERD (gastroesophageal reflux disease)   ? 09/22/16- "no too much anymore"  ? GI bleed 10/24/2016  ? High cholesterol   ? History of blood transfusion   ? History of hiatal hernia   ? Hypertension   ? Macular degeneration, wet ([place])   ? "started in right eye; now legally blind in that eye; now started in left eye but pretty much in control" (09/05/2016)  ? Osteoporosis   ? Peripheral vascular disease ([place])   ? nonviable tissue Right foot  ? PONV (postoperative nausea and vomiting)   ? Squamous cell carcinoma of skin of right calf Aug. 2015  ? Stroke [facility])   ? TIA's no residual  ? TIA (transient ischemic attack)   ? "several at once; none in a long time" (09/05/2016)  ? ? ? ?FAMILY HISTORY:  ? ?Family History  ?Problem Relation Age of Onset  ? Heart disease Father   ?      Heart Disease before age 64  ? Hyperlipidemia Father   ? Hypertension Father   ? Alcohol abuse Father   ? Heart disease Brother   ? Hyperlipidemia Brother   ? Hypertension Brother   ? Deep vein thrombosis Brother   ? Heart disease Son   ?     Heart Disease before age 11  ? Hyperlipidemia Son   ? Hypertension Son   ? Heart attack Son   ? Diabetes Son   ? Hypertension Son   ? Hyperlipidemia Sister   ? Hypertension Sister   ? ? ?SOCIAL HISTORY:  ? ?Social History  ? ?Tobacco Use  ? Smoking status: Former  ?  Types: Cigarettes  ?  Quit date: 11/17/1946  ?  Years since quitting: 75.3  ? Smokeless tobacco: Never  ? Tobacco comments:  ?  "never smoked much"  ?Substance Use Topics  ? Alcohol use: No  ?  Alcohol/week: 0.0 standard drinks  ? ? ? ?ALLERGIES:  ? ?Allergies  ?Allergen Reactions  ? Motrin [Ibuprofen] Other (See Comments)  ?  ADVERSE REACTION - GI BLEED  ? Statins Other (See Comments)  ?  ADVERSE REACTION ?MUSCLE PAIN & WEAKNESS  ? Amitriptyline Other (See Comments)  ?  Unknown reaction  per daughter, unable to tolerate  ? Eliquis [Apixaban] Other (See Comments)  ?  Lower GI bleeding  ? Ezetimibe Other (See Comments)  ? Lisinopril Cough  ? Lisinopril-Hydrochlorothiazide   ?  Other reaction(s): felt "bad" on  ? Niacin And Related Other (See Comments)  ? Nitrofurantoin Other (See Comments)  ? Paxil [Paroxetine] Other (See Comments)  ? Morphine And Related Other (See Comments)  ?  HALLUCINATIONS ?REACTION IS SIDE EFFECT  ? Promethazine Hcl Other (See Comments)  ?  IV causes confusion, oral better  ? Sulfa Antibiotics Nausea And Vomiting  ? ? ? ?CURRENT MEDICATIONS:  ? ?Current Outpatient Medications  ?Medication Sig Dispense Refill  ? acetaminophen (TYLENOL) 500 MG tablet Take 1,000 mg by mouth every 8 (eight) hours as needed for mild pain.     ? Alirocumab (PRALUENT) 150 MG/ML SOAJ INJECT 150 MG INTO THE SKIN EVERY 14 (FOURTEEN) DAYS 2 mL 11  ? clopidogrel (PLAVIX) 75 MG tablet Take 1 tablet (75 mg total) by mouth  daily. 30 tablet 1  ? Cyanocobalamin (VITAMIN B-12 PO) Place 1 tablet under the tongue daily.    ? diazepam (VALIUM) 5 MG tablet Take 0.5 tablets (2.5 mg total) by mouth at bedtime as needed for anxiety. 20 tablet 0  ? diphenoxylate-atropine (LOMOTIL) 2.5-0.025 MG tablet Take 1 tablet by mouth as needed for diarrhea or loose stools.    ? furosemide (LASIX) 20 MG tablet Take 20 mg by mouth every Monday, Wednesday, and Friday.    ? gabapentin (NEURONTIN) 300 MG capsule Take 300 mg by mouth See admin instructions. Take '300mg'$  by mouth three times daily.  May take an additional '300mg'$  at bedtime as needed for pain.    ? isosorbide mononitrate (IMDUR) 30 MG 24 hr tablet Take 1 tablet (30 mg total) by mouth daily. 90 tablet 3  ? levothyroxine (SYNTHROID) 50 MCG tablet Take 50 mcg by mouth every morning.    ? loperamide (IMODIUM) 2 MG capsule Take 2 mg by mouth See admin instructions. Take '2mg'$  by mouth as needed after each loose stool.  Do not exceed 8 capsules in 24 hours.    ? metoprolol tartrate (LOPRESSOR) 25 MG tablet Take 1 tablet (25 mg total) by mouth at bedtime. (Patient taking differently: Take 25 mg by mouth 2 (two) times daily.) 30 tablet 1  ? Multiple Vitamins-Minerals (IMMUNE SUPPORT VITAMIN C PO) Take 1 tablet by mouth 2 (two) times daily.     ? Multiple Vitamins-Minerals (PRESERVISION AREDS PO) Take 1 capsule by mouth in the morning and at bedtime.    ? oxycodone (OXY-IR) 5 MG capsule Take 1 capsule (5 mg total) by mouth every 6 (six) hours as needed for pain. 15 capsule 0  ? potassium chloride (K-DUR,KLOR-CON) 10 MEQ tablet Take 10 mEq by mouth daily.    ? valsartan-hydrochlorothiazide (DIOVAN-HCT) 160-12.5 MG tablet Take 0.5 tablets by mouth daily. 30 tablet 0  ? VITAMIN D PO Take 1,000 Units by mouth daily.    ? cefUROXime (CEFTIN) 250 MG tablet Take 500 mg by mouth 2 (two) times daily. (Patient not taking: Reported on 03/17/2022)    ? ?No current facility-administered medications for this visit.   ? ? ?REVIEW OF SYSTEMS:  ? ?'[X]'$  denotes positive finding, '[ ]'$  denotes negative finding ?Cardiac  Comments:  ?Chest pain or chest pressure:    ?Shortness of breath upon exertion:    ?Short of breath when lying flat:    ?Irregular heart rhythm:    ?    ?  Vascular    ?Pain in calf, thigh, or hip brought on by ambulation:    ?Pain in feet at night that wakes you up from your sleep:     ?Blood clot in your veins:    ?Leg swelling:     ?    ?Pulmonary    ?Oxygen at home:    ?Productive cough:     ?Wheezing:     ?    ?Neurologic    ?Sudden weakness in arms or legs:     ?Sudden numbness in arms or legs:     ?Sudden onset of difficulty speaking or slurred speech:    ?Temporary loss of vision in one eye:     ?Problems with dizziness:     ?    ?Gastrointestinal    ?Blood in stool:     ?Vomited blood:     ?    ?Genitourinary    ?Burning when urinating:     ?Blood in urine:    ?    ?Psychiatric    ?Major depression:     ?    ?Hematologic    ?Bleeding problems:    ?Problems with blood clotting too easily:    ?    ?Skin    ?Rashes or ulcers:    ?    ?Constitutional    ?Fever or chills:    ? ? ?PHYSICAL EXAM:  ? ?Vitals:  ? 03/17/22 1138  ?BP: (!) 159/103  ?Pulse: 73  ?Resp: 20  ?Temp: 97.9 ?F (36.6 ?C)  ?SpO2: 92%  ?Weight: 132 lb (59.9 kg)  ?Height: '5\' 2"'$  (1.575 m)  ? ? ?GENERAL: The patient is a well-nourished female, in no acute distress. The vital signs are documented above. ?CARDIAC: There is a regular rate and rhythm.  ?VASCULAR: Nonpalpable pedal pulses ?PULMONARY: Non-labored respirations ?MUSCULOSKELETAL: Right leg amputation. ?NEUROLOGIC: No focal weakness or paresthesias are detected. ?SKIN: There are no ulcers or rashes noted. ?PSYCHIATRIC: The patient has a normal affect. ? ?STUDIES:  ? ?I have reviewed the following: ?Left: Patent left femoral-ATA bypass graft. Stenosis noted at the inflow  ?artery and proximal anastomosis. Essentially unchanged from the prior  ?exam. Outflow artery appears to occlude approximately 1  cm from the distal  ?anastomosis.  ?+-------+-----------+-----------+------------+------------+  ?ABI/TBIToday's ABIToday's TBIPrevious ABIPrevious TBI  ?+-------+-----------+-----------+------------+------------+

## 2022-03-24 ENCOUNTER — Other Ambulatory Visit: Payer: Self-pay | Admitting: *Deleted

## 2022-03-24 DIAGNOSIS — I739 Peripheral vascular disease, unspecified: Secondary | ICD-10-CM

## 2022-03-24 DIAGNOSIS — I70213 Atherosclerosis of native arteries of extremities with intermittent claudication, bilateral legs: Secondary | ICD-10-CM

## 2022-03-31 ENCOUNTER — Telehealth: Payer: Self-pay | Admitting: Pharmacist

## 2022-03-31 NOTE — Telephone Encounter (Signed)
Patient's daughter called stating patients [employer] grant expired. Fund is currently closed. I added her to the list that will need to be renwewed when funds open. Also gave her info on applying for assitance through drug company at [email or web address]. ?

## 2022-04-01 DIAGNOSIS — K219 Gastro-esophageal reflux disease without esophagitis: Secondary | ICD-10-CM | POA: Diagnosis not present

## 2022-04-01 DIAGNOSIS — M810 Age-related osteoporosis without current pathological fracture: Secondary | ICD-10-CM | POA: Diagnosis not present

## 2022-04-01 DIAGNOSIS — I10 Essential (primary) hypertension: Secondary | ICD-10-CM | POA: Diagnosis not present

## 2022-04-01 DIAGNOSIS — E038 Other specified hypothyroidism: Secondary | ICD-10-CM | POA: Diagnosis not present

## 2022-04-01 DIAGNOSIS — E782 Mixed hyperlipidemia: Secondary | ICD-10-CM | POA: Diagnosis not present

## 2022-04-25 DIAGNOSIS — E038 Other specified hypothyroidism: Secondary | ICD-10-CM | POA: Diagnosis not present

## 2022-04-25 DIAGNOSIS — G8929 Other chronic pain: Secondary | ICD-10-CM | POA: Diagnosis not present

## 2022-04-25 DIAGNOSIS — E782 Mixed hyperlipidemia: Secondary | ICD-10-CM | POA: Diagnosis not present

## 2022-04-25 DIAGNOSIS — I10 Essential (primary) hypertension: Secondary | ICD-10-CM | POA: Diagnosis not present

## 2022-04-25 DIAGNOSIS — K219 Gastro-esophageal reflux disease without esophagitis: Secondary | ICD-10-CM | POA: Diagnosis not present

## 2022-04-25 DIAGNOSIS — M810 Age-related osteoporosis without current pathological fracture: Secondary | ICD-10-CM | POA: Diagnosis not present

## 2022-05-12 DIAGNOSIS — R519 Headache, unspecified: Secondary | ICD-10-CM | POA: Diagnosis not present

## 2022-05-12 DIAGNOSIS — M1990 Unspecified osteoarthritis, unspecified site: Secondary | ICD-10-CM | POA: Diagnosis not present

## 2022-05-12 DIAGNOSIS — Z1331 Encounter for screening for depression: Secondary | ICD-10-CM | POA: Diagnosis not present

## 2022-05-12 DIAGNOSIS — G629 Polyneuropathy, unspecified: Secondary | ICD-10-CM | POA: Diagnosis not present

## 2022-05-12 DIAGNOSIS — E559 Vitamin D deficiency, unspecified: Secondary | ICD-10-CM | POA: Diagnosis not present

## 2022-05-12 DIAGNOSIS — E538 Deficiency of other specified B group vitamins: Secondary | ICD-10-CM | POA: Diagnosis not present

## 2022-05-12 DIAGNOSIS — F064 Anxiety disorder due to known physiological condition: Secondary | ICD-10-CM | POA: Diagnosis not present

## 2022-05-12 DIAGNOSIS — G8929 Other chronic pain: Secondary | ICD-10-CM | POA: Diagnosis not present

## 2022-05-12 DIAGNOSIS — I10 Essential (primary) hypertension: Secondary | ICD-10-CM | POA: Diagnosis not present

## 2022-05-12 DIAGNOSIS — I739 Peripheral vascular disease, unspecified: Secondary | ICD-10-CM | POA: Diagnosis not present

## 2022-05-12 DIAGNOSIS — Z89511 Acquired absence of right leg below knee: Secondary | ICD-10-CM | POA: Diagnosis not present

## 2022-05-12 DIAGNOSIS — Z0000 Encounter for general adult medical examination without abnormal findings: Secondary | ICD-10-CM | POA: Diagnosis not present

## 2022-05-12 DIAGNOSIS — E038 Other specified hypothyroidism: Secondary | ICD-10-CM | POA: Diagnosis not present

## 2022-05-12 DIAGNOSIS — G4700 Insomnia, unspecified: Secondary | ICD-10-CM | POA: Diagnosis not present

## 2022-05-26 DIAGNOSIS — E038 Other specified hypothyroidism: Secondary | ICD-10-CM | POA: Diagnosis not present

## 2022-05-26 DIAGNOSIS — K219 Gastro-esophageal reflux disease without esophagitis: Secondary | ICD-10-CM | POA: Diagnosis not present

## 2022-05-26 DIAGNOSIS — I10 Essential (primary) hypertension: Secondary | ICD-10-CM | POA: Diagnosis not present

## 2022-05-26 DIAGNOSIS — M810 Age-related osteoporosis without current pathological fracture: Secondary | ICD-10-CM | POA: Diagnosis not present

## 2022-05-26 DIAGNOSIS — E782 Mixed hyperlipidemia: Secondary | ICD-10-CM | POA: Diagnosis not present

## 2022-05-26 DIAGNOSIS — G8929 Other chronic pain: Secondary | ICD-10-CM | POA: Diagnosis not present

## 2022-06-20 ENCOUNTER — Encounter: Payer: Self-pay | Admitting: Pharmacist

## 2022-06-23 ENCOUNTER — Emergency Department (HOSPITAL_BASED_OUTPATIENT_CLINIC_OR_DEPARTMENT_OTHER)
Admission: EM | Admit: 2022-06-23 | Discharge: 2022-06-23 | Disposition: A | Discharge status: Home / Self Care | Payer: Medicare Other | Attending: Emergency Medicine | Admitting: Emergency Medicine

## 2022-06-23 DIAGNOSIS — I4891 Unspecified atrial fibrillation: Secondary | ICD-10-CM | POA: Insufficient documentation

## 2022-06-23 DIAGNOSIS — Z79899 Other long term (current) drug therapy: Secondary | ICD-10-CM | POA: Insufficient documentation

## 2022-06-23 DIAGNOSIS — I10 Essential (primary) hypertension: Secondary | ICD-10-CM | POA: Diagnosis not present

## 2022-06-23 DIAGNOSIS — R310 Gross hematuria: Secondary | ICD-10-CM

## 2022-06-23 DIAGNOSIS — R319 Hematuria, unspecified: Secondary | ICD-10-CM | POA: Diagnosis present

## 2022-06-23 LAB — URINALYSIS, ROUTINE W REFLEX MICROSCOPIC
RBC / HPF: >50 RBC/hpf — ABNORMAL HIGH (ref 0–5)
WBC, UA: >50 WBC/hpf — ABNORMAL HIGH (ref 0–5)

## 2022-06-23 LAB — CBC WITH DIFFERENTIAL/PLATELET
Abs Immature Granulocytes: 0.08 10*3/uL — ABNORMAL HIGH (ref 0.00–0.07)
Basophils Absolute: 0.1 10*3/uL (ref 0.0–0.1)
Basophils Relative: 1 %
Eosinophils Absolute: 0.4 10*3/uL (ref 0.0–0.5)
Eosinophils Relative: 6 %
HCT: 37.6 % (ref 36.0–46.0)
Hemoglobin: 12.3 g/dL (ref 12.0–15.0)
Immature Granulocytes: 1 %
Lymphocytes Relative: 33 %
Lymphs Abs: 2.2 10*3/uL (ref 0.7–4.0)
MCH: 30.8 pg (ref 26.0–34.0)
MCHC: 32.7 g/dL (ref 30.0–36.0)
MCV: 94.2 fL (ref 80.0–100.0)
Monocytes Absolute: 0.8 10*3/uL (ref 0.1–1.0)
Monocytes Relative: 12 %
Neutro Abs: 3.2 10*3/uL (ref 1.7–7.7)
Neutrophils Relative %: 47 %
Platelets: 222 10*3/uL (ref 150–400)
RBC: 3.99 MIL/uL (ref 3.87–5.11)
RDW: 13.7 % (ref 11.5–15.5)
WBC: 6.9 10*3/uL (ref 4.0–10.5)
nRBC: 0 % (ref 0.0–0.2)

## 2022-06-23 LAB — BASIC METABOLIC PANEL
Anion gap: 10 (ref 5–15)
BUN: 25 mg/dL — ABNORMAL HIGH (ref 8–23)
CO2: 27 mmol/L (ref 22–32)
Calcium: 9.6 mg/dL (ref 8.9–10.3)
Chloride: 102 mmol/L (ref 98–111)
Creatinine, Ser: 1.25 mg/dL — ABNORMAL HIGH (ref 0.44–1.00)
GFR, Estimated: 41 mL/min — ABNORMAL LOW (ref >60)
Glucose, Bld: 94 mg/dL (ref 70–99)
Potassium: 3.9 mmol/L (ref 3.5–5.1)
Sodium: 139 mmol/L (ref 135–145)

## 2022-06-23 MED ORDER — CEPHALEXIN 250 MG PO CAPS
500.0000 mg | ORAL_CAPSULE | Freq: Once | ORAL | Status: AC
Start: 2022-06-23 — End: 2022-06-23
  Administered 2022-06-23: 500 mg via ORAL
  Filled 2022-06-23: qty 2

## 2022-06-23 MED ORDER — CEPHALEXIN 500 MG PO CAPS: 500.0000 mg | ORAL_CAPSULE | Freq: Three times a day (TID) | ORAL | 0 refills | Status: AC

## 2022-06-23 NOTE — ED Triage Notes (Signed)
POV from [facility], BIB wheelchair, pt sts that since approx 5pm shes had trouble urinating and saw blood in her urine. Hx of UTIs and hematuria. Pt alert and oriented x 4.

## 2022-06-23 NOTE — ED Provider Notes (Signed)
[facility] EMERGENCY DEPT Provider Note  CSN: [id number] Arrival date & time: 06/23/22 0057  Chief Complaint(s) Hematuria  HPI [name] is a 88 y.o. female with a past medical history listed below including hypertension, hyperlipidemia, atrial fibrillation not anticoagulated here for hematuria that began this evening.  She reports having urgency but inability to void.  She had associated suprapubic pressure. No nausea or vomiting.  No fevers or chills.  No flank pain.  No other physical complaints.  The history is provided by the patient and a relative.    Past Medical History Past Medical History:  Diagnosis Date   Anemia    Anginal pain ([place])    Arthritis    "qwhere" (09/05/2016)   Atrial fibrillation ([place]) 09/2016   Chronic lower back pain    Complication of anesthesia    "takes a long time for it to wear off; I can hallucinate if I take too much" (09/05/2016)   DVT (deep venous thrombosis) ([place]) 10/2009   Fall from steps 08/31/2013   Fx. pelvis, Left Hip, Left Elbow   Fibromyalgia    GERD (gastroesophageal reflux disease)    09/22/16- "no too much anymore"   GI bleed 10/24/2016   High cholesterol    History of blood transfusion    History of hiatal hernia    Hypertension    Macular degeneration, wet ([place])    "started in right eye; now legally blind in that eye; now started in left eye but pretty much in control" (09/05/2016)   Osteoporosis    Peripheral vascular disease ([place])    nonviable tissue Right foot   PONV (postoperative nausea and vomiting)    Squamous cell carcinoma of skin of right calf Aug. 2015   Stroke [facility])    TIA's no residual   TIA (transient ischemic attack)    "several at once; none in a long time" (09/05/2016)   Patient Active Problem List   Diagnosis Date Noted   Chest pain of uncertain etiology 73/22/0254   History of CVA (cerebrovascular accident) 08/14/2021   Thrombosis 05/04/2020   Secondary hypercoagulable state  ([place]) 01/09/2020   Critical lower limb ischemia ([place]) 12/28/2019   Plantar fasciitis of left foot 06/10/2017   Idiopathic chronic venous hypertension of both lower extremities with inflammation 05/21/2017   Foot drop, left 05/21/2017   Decubitus ulcer of sacral region, unstageable ([place])    Deep tissue injury    Hypokalemia    Hypoalbuminemia due to protein-calorie malnutrition ([place])    Debilitated 01/21/2017   Neuropathic pain    Post-operative pain    Slow transit constipation    Debility    Ischemic leg    PAF (paroxysmal atrial fibrillation) (HCC)    Anemia of chronic disease    Chronic pain syndrome    Fibromyalgia    Stage 3 chronic kidney disease ([facility])    Benign essential HTN    Abnormal urinalysis    PVD (peripheral vascular disease) ([place]) 01/13/2017   Post-op pain 01/03/2017   Phantom limb pain ([place]) 01/03/2017   S/P unilateral BKA (below knee amputation), right ([place]) 12/30/2016   Acute on chronic combined systolic and diastolic CHF (congestive heart failure) ([place]) 12/22/2016   Anemia 12/22/2016   Palliative care encounter    Lower extremity pain, right    Acute GI bleeding 10/23/2016   Paroxysmal atrial fibrillation ([employer]) 10/03/2016   Atherosclerosis of autologous vein bypass graft of left lower extremity with rest pain ([place]) 09/24/2016   PAD (peripheral  artery disease) ([place]) 09/05/2016   Groin pain 04/02/2015   Aftercare following surgery of the circulatory system, NEC 12/13/2013   Shingles 10/26/2013   Anxiety 10/26/2013   Acute blood loss anemia 09/05/2013   Elbow fracture, left 09/05/2013   Left acetabular fracture ([place]) 09/05/2013   Ankle fracture, left 09/05/2013   HTN (hypertension) 09/03/2013   HLD (hyperlipidemia) 09/03/2013   Peripheral vascular disease, unspecified ([place]) 05/10/2012   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  Authorizing Provider  cephALEXin (KEFLEX) 500 MG capsule Take 1 capsule (500 mg total) by mouth 3 (three) times daily for 7 days. 06/23/22 06/30/22 Yes Sophonie Goforth, Grayce Sessions, MD  acetaminophen (TYLENOL) 500 MG tablet Take 1,000 mg by mouth every 8 (eight) hours as needed for mild pain.     [provider]  Alirocumab (PRALUENT) 150 MG/ML SOAJ INJECT 150 MG INTO THE SKIN EVERY 14 (FOURTEEN) DAYS 01/17/22   Belva Crome, MD  clopidogrel (PLAVIX) 75 MG tablet Take 1 tablet (75 mg total) by mouth daily. 01/28/17   Angiulli, Lavon Paganini, PA-C  Cyanocobalamin (VITAMIN B-12 PO) Place 1 tablet under the tongue daily.    [provider]  diazepam (VALIUM) 5 MG tablet Take 0.5 tablets (2.5 mg total) by mouth at bedtime as needed for anxiety. 01/28/17   Angiulli, Lavon Paganini, PA-C  diphenoxylate-atropine (LOMOTIL) 2.5-0.025 MG tablet Take 1 tablet by mouth as needed for diarrhea or loose stools. 06/05/21   [provider]  furosemide (LASIX) 20 MG tablet Take 20 mg by mouth every Monday, Wednesday, and Friday.    [provider]  gabapentin (NEURONTIN) 300 MG capsule Take 300 mg by mouth See admin instructions. Take '300mg'$  by mouth three times daily.  May take an additional '300mg'$  at bedtime as needed for pain.    [provider]  isosorbide mononitrate (IMDUR) 30 MG 24 hr tablet Take 1 tablet (30 mg total) by mouth daily. 08/14/21   Werner Lean, MD  levothyroxine (SYNTHROID) 50 MCG tablet Take 50 mcg by mouth every morning. 08/07/21   [provider]  loperamide (IMODIUM) 2 MG capsule Take 2 mg by mouth See admin instructions. Take '2mg'$  by mouth as needed after each loose stool.  Do not exceed 8 capsules in 24 hours.    [provider]  metoprolol tartrate (LOPRESSOR) 25 MG tablet Take 1 tablet (25 mg total) by mouth at bedtime. Patient taking differently: Take 25 mg by mouth 2 (two) times daily. 01/28/17   Angiulli, Lavon Paganini, PA-C  Multiple  Vitamins-Minerals (IMMUNE SUPPORT VITAMIN C PO) Take 1 tablet by mouth 2 (two) times daily.     [provider]  Multiple Vitamins-Minerals (PRESERVISION AREDS PO) Take 1 capsule by mouth in the morning and at bedtime.    [provider]  oxycodone (OXY-IR) 5 MG capsule Take 1 capsule (5 mg total) by mouth every 6 (six) hours as needed for pain. 05/06/20   Dagoberto Ligas, PA-C  potassium chloride (K-DUR,KLOR-CON) 10 MEQ tablet Take 10 mEq by mouth daily.    [provider]  valsartan-hydrochlorothiazide (DIOVAN-HCT) 160-12.5 MG tablet Take 0.5 tablets by mouth daily. 01/28/17   Angiulli, Lavon Paganini, PA-C  VITAMIN D PO Take 1,000 Units by mouth daily.    [provider]  Allergies Motrin [ibuprofen], Statins, Amitriptyline, Augmentin [amoxicillin-pot clavulanate], Doxycycline, Eliquis [apixaban], Ezetimibe, Lisinopril, Lisinopril-hydrochlorothiazide, Niacin and related, Nitrofurantoin, Paxil [paroxetine], Morphine and related, Promethazine hcl, and Sulfa antibiotics  Review of Systems Review of Systems As noted in HPI  Physical Exam Vital Signs  I have reviewed the triage vital signs BP (!) 156/65   Pulse 65   Temp 98.2 F (36.8 C) (Oral)   Resp 18   Ht '5\' 2"'$  (1.575 m)   Wt 56.2 kg   SpO2 97%   BMI 22.68 kg/m   Physical Exam  ED Results and Treatments Labs (all labs ordered are listed, but only abnormal results are displayed) Labs Reviewed  CBC WITH DIFFERENTIAL/PLATELET - Abnormal; Notable for the following components:      Result Value   Abs Immature Granulocytes 0.08 (*)    All other components within normal limits  BASIC METABOLIC PANEL - Abnormal; Notable for the following components:   BUN 25 (*)    Creatinine, Ser 1.25 (*)    GFR, Estimated 41 (*)    All other components within normal limits  URINALYSIS,  ROUTINE W REFLEX MICROSCOPIC - Abnormal; Notable for the following components:   Color, Urine RED (*)    APPearance CLOUDY (*)    Glucose, UA   (*)    Value: TEST NOT REPORTED DUE TO COLOR INTERFERENCE OF URINE PIGMENT   Hgb urine dipstick   (*)    Value: TEST NOT REPORTED DUE TO COLOR INTERFERENCE OF URINE PIGMENT   Bilirubin Urine   (*)    Value: TEST NOT REPORTED DUE TO COLOR INTERFERENCE OF URINE PIGMENT   Ketones, ur   (*)    Value: TEST NOT REPORTED DUE TO COLOR INTERFERENCE OF URINE PIGMENT   Protein, ur   (*)    Value: TEST NOT REPORTED DUE TO COLOR INTERFERENCE OF URINE PIGMENT   Nitrite   (*)    Value: TEST NOT REPORTED DUE TO COLOR INTERFERENCE OF URINE PIGMENT   Leukocytes,Ua   (*)    Value: TEST NOT REPORTED DUE TO COLOR INTERFERENCE OF URINE PIGMENT   RBC / HPF >50 (*)    WBC, UA >50 (*)    Bacteria, UA RARE (*)    All other components within normal limits  URINE CULTURE                                                                                                                         EKG  EKG Interpretation  Date/Time:    Ventricular Rate:    PR Interval:    QRS Duration:   QT Interval:    QTC Calculation:   R Axis:     Text Interpretation:         Radiology No results found.  Pertinent labs & imaging results that were available during my care of the patient were reviewed by me and considered in my medical decision making (see MDM for details).  Medications Ordered in  ED Medications  cephALEXin (KEFLEX) capsule 500 mg (500 mg Oral Given 06/23/22 0521)                                                                                                                                     Procedures Procedures  (including critical care time)  Medical Decision Making / ED Course    Complexity of Problem:  Co-morbidities/SDOH that complicate the patient evaluation/care: Noted above in HPI  Patient's presenting problem/concern, DDX, and MDM listed  below: Hematuria Likely infection Will assess for any electrolyte derangements or renal insufficiency Will check hemoglobin Will ensure patient has follow-up having urinary retention    Complexity of Data:    Laboratory Tests ordered listed below with my independent interpretation: CBC without leukocytosis or anemia. Metabolic panel without significant electrolyte derangements.  Stable renal function. UA with large amount of gross hematuria.  WBC clumps.  Concern for infection.     ED Course:    Assessment, Add'l Intervention, and Reassessment: Hematuria Concerning for UTI Will treat with Keflex Patient tolerated p.o. intake. Bladder scan negative for retention. Safe for outpatient management PCP follow-up    Final Clinical Impression(s) / ED Diagnoses Final diagnoses:  Gross hematuria   The patient appears reasonably screened and/or stabilized for discharge and I doubt any other medical condition or other [facility] requiring further screening, evaluation, or treatment in the ED at this time. I have discussed the findings, Dx and Tx plan with the patient/family who expressed understanding and agree(s) with the plan. Discharge instructions discussed at length. The patient/family was given strict return precautions who verbalized understanding of the instructions. No further questions at time of discharge.  Disposition: Discharge  Condition: Good  ED Discharge Orders          Ordered    cephALEXin (KEFLEX) 500 MG capsule  3 times daily        06/23/22 0507            Follow Up: Josetta Huddle, MD [address] [place] [address] [phone]  Call  to schedule an appointment for close follow up           This chart was dictated using voice recognition software.  Despite best efforts to proofread,  errors can occur which can change the documentation meaning.    Fatima Blank, MD 06/23/22 [phone]

## 2022-06-24 LAB — URINE CULTURE

## 2022-06-25 DIAGNOSIS — E038 Other specified hypothyroidism: Secondary | ICD-10-CM | POA: Diagnosis not present

## 2022-06-25 DIAGNOSIS — E782 Mixed hyperlipidemia: Secondary | ICD-10-CM | POA: Diagnosis not present

## 2022-06-25 DIAGNOSIS — K219 Gastro-esophageal reflux disease without esophagitis: Secondary | ICD-10-CM | POA: Diagnosis not present

## 2022-06-25 DIAGNOSIS — G8929 Other chronic pain: Secondary | ICD-10-CM | POA: Diagnosis not present

## 2022-06-25 DIAGNOSIS — M810 Age-related osteoporosis without current pathological fracture: Secondary | ICD-10-CM | POA: Diagnosis not present

## 2022-06-25 DIAGNOSIS — I10 Essential (primary) hypertension: Secondary | ICD-10-CM | POA: Diagnosis not present

## 2022-07-23 ENCOUNTER — Other Ambulatory Visit: Payer: Self-pay | Admitting: Internal Medicine

## 2022-07-24 DIAGNOSIS — K219 Gastro-esophageal reflux disease without esophagitis: Secondary | ICD-10-CM | POA: Diagnosis not present

## 2022-07-24 DIAGNOSIS — E038 Other specified hypothyroidism: Secondary | ICD-10-CM | POA: Diagnosis not present

## 2022-07-24 DIAGNOSIS — M810 Age-related osteoporosis without current pathological fracture: Secondary | ICD-10-CM | POA: Diagnosis not present

## 2022-07-24 DIAGNOSIS — I10 Essential (primary) hypertension: Secondary | ICD-10-CM | POA: Diagnosis not present

## 2022-07-24 DIAGNOSIS — G8929 Other chronic pain: Secondary | ICD-10-CM | POA: Diagnosis not present

## 2022-07-24 DIAGNOSIS — E782 Mixed hyperlipidemia: Secondary | ICD-10-CM | POA: Diagnosis not present

## 2022-08-26 DIAGNOSIS — I10 Essential (primary) hypertension: Secondary | ICD-10-CM | POA: Diagnosis not present

## 2022-08-26 DIAGNOSIS — E038 Other specified hypothyroidism: Secondary | ICD-10-CM | POA: Diagnosis not present

## 2022-08-26 DIAGNOSIS — M810 Age-related osteoporosis without current pathological fracture: Secondary | ICD-10-CM | POA: Diagnosis not present

## 2022-08-26 DIAGNOSIS — E782 Mixed hyperlipidemia: Secondary | ICD-10-CM | POA: Diagnosis not present

## 2022-08-26 DIAGNOSIS — K219 Gastro-esophageal reflux disease without esophagitis: Secondary | ICD-10-CM | POA: Diagnosis not present

## 2022-08-26 DIAGNOSIS — G8929 Other chronic pain: Secondary | ICD-10-CM | POA: Diagnosis not present

## 2022-08-26 DIAGNOSIS — G4700 Insomnia, unspecified: Secondary | ICD-10-CM | POA: Diagnosis not present

## 2022-09-11 DIAGNOSIS — I10 Essential (primary) hypertension: Secondary | ICD-10-CM | POA: Diagnosis not present

## 2022-09-11 DIAGNOSIS — D649 Anemia, unspecified: Secondary | ICD-10-CM | POA: Diagnosis not present

## 2022-09-12 DIAGNOSIS — Z7409 Other reduced mobility: Secondary | ICD-10-CM | POA: Diagnosis not present

## 2022-09-12 DIAGNOSIS — I679 Cerebrovascular disease, unspecified: Secondary | ICD-10-CM | POA: Diagnosis not present

## 2022-09-12 DIAGNOSIS — I480 Paroxysmal atrial fibrillation: Secondary | ICD-10-CM | POA: Diagnosis not present

## 2022-09-12 DIAGNOSIS — I739 Peripheral vascular disease, unspecified: Secondary | ICD-10-CM | POA: Diagnosis not present

## 2022-09-12 DIAGNOSIS — I129 Hypertensive chronic kidney disease with stage 1 through stage 4 chronic kidney disease, or unspecified chronic kidney disease: Secondary | ICD-10-CM | POA: Diagnosis not present

## 2022-09-12 DIAGNOSIS — N1831 Chronic kidney disease, stage 3a: Secondary | ICD-10-CM | POA: Diagnosis not present

## 2022-09-12 DIAGNOSIS — G629 Polyneuropathy, unspecified: Secondary | ICD-10-CM | POA: Diagnosis not present

## 2022-10-14 DIAGNOSIS — G629 Polyneuropathy, unspecified: Secondary | ICD-10-CM | POA: Diagnosis not present

## 2022-10-14 DIAGNOSIS — F419 Anxiety disorder, unspecified: Secondary | ICD-10-CM | POA: Diagnosis not present

## 2022-10-14 DIAGNOSIS — E039 Hypothyroidism, unspecified: Secondary | ICD-10-CM | POA: Diagnosis not present

## 2022-10-14 DIAGNOSIS — N183 Chronic kidney disease, stage 3 unspecified: Secondary | ICD-10-CM | POA: Diagnosis not present

## 2022-10-14 DIAGNOSIS — E785 Hyperlipidemia, unspecified: Secondary | ICD-10-CM | POA: Diagnosis not present

## 2022-10-14 DIAGNOSIS — I739 Peripheral vascular disease, unspecified: Secondary | ICD-10-CM | POA: Diagnosis not present

## 2022-10-14 DIAGNOSIS — I480 Paroxysmal atrial fibrillation: Secondary | ICD-10-CM | POA: Diagnosis not present

## 2022-10-14 DIAGNOSIS — Z7409 Other reduced mobility: Secondary | ICD-10-CM | POA: Diagnosis not present

## 2022-10-14 DIAGNOSIS — I129 Hypertensive chronic kidney disease with stage 1 through stage 4 chronic kidney disease, or unspecified chronic kidney disease: Secondary | ICD-10-CM | POA: Diagnosis not present

## 2022-10-29 ENCOUNTER — Encounter: Payer: Self-pay | Admitting: Physician Assistant

## 2022-10-29 ENCOUNTER — Ambulatory Visit: Payer: Medicare Other | Attending: Physician Assistant | Admitting: Physician Assistant

## 2022-10-29 VITALS — BP 122/60 | HR 71 | Ht 62.0 in | Wt 119.0 lb

## 2022-10-29 DIAGNOSIS — I480 Paroxysmal atrial fibrillation: Secondary | ICD-10-CM | POA: Insufficient documentation

## 2022-10-29 DIAGNOSIS — I70213 Atherosclerosis of native arteries of extremities with intermittent claudication, bilateral legs: Secondary | ICD-10-CM | POA: Diagnosis not present

## 2022-10-29 DIAGNOSIS — I10 Essential (primary) hypertension: Secondary | ICD-10-CM | POA: Diagnosis not present

## 2022-10-29 DIAGNOSIS — E785 Hyperlipidemia, unspecified: Secondary | ICD-10-CM | POA: Insufficient documentation

## 2022-10-29 DIAGNOSIS — Z8673 Personal history of transient ischemic attack (TIA), and cerebral infarction without residual deficits: Secondary | ICD-10-CM | POA: Diagnosis not present

## 2022-10-29 DIAGNOSIS — I739 Peripheral vascular disease, unspecified: Secondary | ICD-10-CM | POA: Diagnosis not present

## 2022-10-29 MED ORDER — FUROSEMIDE 20 MG PO TABS
ORAL_TABLET | ORAL | 3 refills | Status: DC
Start: 2022-10-29 — End: 2023-08-23

## 2022-10-29 NOTE — Progress Notes (Signed)
Cardiology Office Note:    Date:  10/29/2022   ID:  [name], DOB 1928-02-02, MRN [id number]  PCP:  Josetta Huddle, [facility] Cardiologist:  Sinclair Grooms, MD     Referring MD: Josetta Huddle, MD   Chief Complaint:  Follow-up     History of Present Illness:   [name] is a 88 y.o. female with hx of  paroxysmal atrial fibrillation, history of lower GI bleed therefore anticoagulation discontinued, HTN, HLD, PVD s/p right BKA in 2018, Fem-pop bypass of LLE, who presented with LLE ischemia now s/p LLE thrombectomy of her graft, CVA/TIA, GERD. Recent chest pain when last seen by Dr. Gasper Sells, isosorbide was started.  Developed shortness of breath leading to discontinuation of amiodarone.  Patient saw Dr.Smith 12/2019 with occasional chest heaviness and unsure if Imdur helping. Could be stopped in future if needed. No recurrence of afib since stopping amio in Oct.    Patient comes in with her daughter. She notices wheezing on days she doesn't take her lasix especially on Sundays. She has occasional chest tightness at night when she lays down but doesn't pay attention to it. She just moved to skilled nursing care at [facility]. She works an Dispensing optician, does Engineering geologist.       Past Medical History:  Diagnosis Date   Anemia    Anginal pain ([place])    Arthritis    "qwhere" (09/05/2016)   Atrial fibrillation ([place]) 09/2016   Chronic lower back pain    Complication of anesthesia    "takes a long time for it to wear off; I can hallucinate if I take too much" (09/05/2016)   DVT (deep venous thrombosis) ([place]) 10/2009   Fall from steps 08/31/2013   Fx. pelvis, Left Hip, Left Elbow   Fibromyalgia    GERD (gastroesophageal reflux disease)    09/22/16- "no too much anymore"   GI bleed 10/24/2016   High cholesterol    History of blood transfusion    History of hiatal hernia    Hypertension    Macular degeneration, wet ([place])    "started in right eye;  now legally blind in that eye; now started in left eye but pretty much in control" (09/05/2016)   Osteoporosis    Peripheral vascular disease ([place])    nonviable tissue Right foot   PONV (postoperative nausea and vomiting)    Squamous cell carcinoma of skin of right calf Aug. 2015   Stroke [facility])    TIA's no residual   TIA (transient ischemic attack)    "several at once; none in a long time" (09/05/2016)   Current Medications: Current Meds  Medication Sig   acetaminophen (TYLENOL) 500 MG tablet Take 1,000 mg by mouth every 8 (eight) hours as needed for mild pain.    Alirocumab (PRALUENT) 150 MG/ML SOAJ INJECT 150 MG INTO THE SKIN EVERY 14 (FOURTEEN) DAYS   clopidogrel (PLAVIX) 75 MG tablet Take 1 tablet (75 mg total) by mouth daily.   Cyanocobalamin (VITAMIN B-12 PO) Place 1 tablet under the tongue daily.   diazepam (VALIUM) 5 MG tablet Take 0.5 tablets (2.5 mg total) by mouth at bedtime as needed for anxiety.   diphenoxylate-atropine (LOMOTIL) 2.5-0.025 MG tablet Take 1 tablet by mouth as needed for diarrhea or loose stools.   gabapentin (NEURONTIN) 300 MG capsule Take 300 mg by mouth See admin instructions. Take '300mg'$  by mouth three times daily.  May take an additional '300mg'$  at bedtime as  needed for pain.   isosorbide mononitrate (IMDUR) 30 MG 24 hr tablet TAKE ONE TABLET BY MOUTH EVERY EVENING   levothyroxine (SYNTHROID) 50 MCG tablet Take 50 mcg by mouth every morning.   loperamide (IMODIUM) 2 MG capsule Take 2 mg by mouth See admin instructions. Take '2mg'$  by mouth as needed after each loose stool.  Do not exceed 8 capsules in 24 hours.   metoprolol tartrate (LOPRESSOR) 25 MG tablet Take 1 tablet (25 mg total) by mouth at bedtime. (Patient taking differently: Take 25 mg by mouth 2 (two) times daily.)   Multiple Vitamins-Minerals (IMMUNE SUPPORT VITAMIN C PO) Take 1 tablet by mouth 2 (two) times daily.    Multiple Vitamins-Minerals (PRESERVISION AREDS PO) Take 1 capsule by mouth in the  morning and at bedtime.   oxycodone (OXY-IR) 5 MG capsule Take 1 capsule (5 mg total) by mouth every 6 (six) hours as needed for pain.   potassium chloride (K-DUR,KLOR-CON) 10 MEQ tablet Take 10 mEq by mouth daily. Take 1 and a half tablets on Sundays   valsartan-hydrochlorothiazide (DIOVAN-HCT) 160-12.5 MG tablet Take 0.5 tablets by mouth daily.   VITAMIN D PO Take 1,000 Units by mouth daily.   [DISCONTINUED] furosemide (LASIX) 20 MG tablet Take 20 mg by mouth every Monday, Wednesday, and Friday.    Allergies:   Motrin [ibuprofen], Statins, Amitriptyline, Augmentin [amoxicillin-pot clavulanate], Doxycycline, Eliquis [apixaban], Ezetimibe, Lisinopril, Lisinopril-hydrochlorothiazide, Niacin and related, Nitrofurantoin, Paxil [paroxetine], Morphine and related, Promethazine hcl, and Sulfa antibiotics   Social History   Tobacco Use   Smoking status: Former    Types: Cigarettes    Quit date: 11/17/1946    Years since quitting: 76.0   Smokeless tobacco: Never   Tobacco comments:    "never smoked much"  Vaping Use   Vaping Use: Never used  Substance Use Topics   Alcohol use: No    Alcohol/week: 0.0 standard drinks of alcohol   Drug use: No    Family Hx: The patient's family history includes Alcohol abuse in her father; Deep vein thrombosis in her brother; Diabetes in her son; Heart attack in her son; Heart disease in her brother, father, and son; Hyperlipidemia in her brother, father, sister, and son; Hypertension in her brother, father, sister, son, and son.  ROS     Physical Exam:    VS:  BP 122/60   Pulse 71   Ht '5\' 2"'$  (1.575 m)   Wt 119 lb (54 kg)   SpO2 99%   BMI 21.77 kg/m     Wt Readings from Last 3 Encounters:  10/29/22 119 lb (54 kg)  06/23/22 124 lb (56.2 kg)  03/17/22 132 lb (59.9 kg)    Physical Exam  GEN: Thin, elderly, in no acute distress  Neck: no JVD, carotid bruits, or masses Cardiac:RRR; no murmurs, rubs, or gallops  Respiratory:  clear to auscultation  bilaterally, normal work of breathing GI: soft, nontender, nondistended, + BS Ext: RAK amputation, left trace edema Neuro:  Alert and Oriented x 3,   Psych: euthymic mood, full affect        EKGs/Labs/Other Test Reviewed:    EKG:  EKG is not ordered today.     Recent Labs: 06/23/2022: BUN 25; Creatinine, Ser 1.25; Hemoglobin 12.3; Platelets 222; Potassium 3.9; Sodium 139   Recent Lipid Panel No results for input(s): "CHOL", "TRIG", "HDL", "VLDL", "LDLCALC", "LDLDIRECT" in the last 8760 hours.   Prior CV Studies:     Cardiac Event Monitoring: Date:02/13/21 Results: The basic  underlying rhythm is normal sinus rhythm Rare PACs and PVCs are noted Nonsustained SVT up to 5 beats is noted. No atrial fibrillation, ventricular tachycardia, or frequent ventricular ectopy is noted.   Overall unremarkable study.  No atrial fibrillation.  Premature beats are noted.  Continue amiodarone.   Transthoracic Echocardiogram: Date: 12/30/2019 Results:  1. Left ventricular ejection fraction, by estimation, is 60 to 65%. The  left ventricle has hyperdynamic function. The left ventricle has no  regional wall motion abnormalities. There is basal septal hypertrophy left  ventricular hypertrophy. Left  ventricular diastolic parameters were normal.   2. Right ventricular systolic function is normal. The right ventricular  size is normal.   3. Left atrial size was mildly dilated.   4. The mitral valve is normal in structure and function. Mild mitral  valve regurgitation. No evidence of mitral stenosis.   5. The aortic valve is tricuspid. Aortic valve regurgitation is trivial .  Mild aortic valve sclerosis is present, with no evidence of aortic valve  stenosis.    CTPE  Date: 06/08/2018 Results: Aortic Atherosclerosis LM and LAD CAC   NM Stress Testing : Date: 09/19/2016 Results: IMPRESSION: 1. No reversible ischemia or infarction.   2. Normal left ventricular wall motion.   3. Left  ventricular ejection fraction 73%   4. Non invasive risk stratification*: Low    Risk Assessment/Calculations/Metrics:    CHA2DS2-VASc Score = 7   This indicates a 11.2% annual risk of stroke. The patient's score is based upon: CHF History: 1 HTN History: 1 Diabetes History: 0 Stroke History: 2 Vascular Disease History: 0 Age Score: 2 Gender Score: 1             ASSESSMENT & PLAN:   No problem-specific Assessment & Plan notes found for this encounter.   PAF no anticoagulation due to GI bleed.Amio stopped due to dyspnea-PFT's mild restriction and mild obstruction.  HR regular today with occasional skip. Last EKG 12/2021 NSR.  Asymptomatic  Wheezing on days she doesn't take her lasix-worse on Sundays. Can take extra lasix 10 mg Sundays, K 10 meq 1/2 Sundays. Otherwise continue 3 times a week. Will have her f/u with Dr. Johney Frame to establish in 6 months  HTN BP well controlled  HLD on praluent  PVD R BKA 2018, Fem pop LLE and LLE thrombectomy of graft  History of CVA/TIA            Dispo:  No follow-ups on file.   Medication Adjustments/Labs and Tests Ordered: Current medicines are reviewed at length with the patient today.  Concerns regarding medicines are outlined above.  Tests Ordered: No orders of the defined types were placed in this encounter.  Medication Changes: No orders of the defined types were placed in this encounter.  Signed, Ermalinda Barrios, PA-C  10/29/2022 1:59 PM    [address], [place], [place]  [address] Phone: [phone]; Fax: [phone]

## 2022-10-29 NOTE — Patient Instructions (Addendum)
Medication Instructions:  Increase Potassium to 1 and a half tablets on Sundays  Add half a tablet of Lasix on Sundays   *If you need a refill on your cardiac medications before your next appointment, please call your pharmacy*   Lab Work: None ordered   If you have labs (blood work) drawn today and your tests are completely normal, you will receive your results only by: [place] (if you have MyChart) OR A paper copy in the mail If you have any lab test that is abnormal or we need to change your treatment, we will call you to review the results.   Testing/Procedures: None ordered    Follow-Up: Follow up as scheduled  Other Instructions   Important Information About Sugar

## 2022-11-03 DIAGNOSIS — I480 Paroxysmal atrial fibrillation: Secondary | ICD-10-CM | POA: Diagnosis not present

## 2022-11-03 DIAGNOSIS — Z7409 Other reduced mobility: Secondary | ICD-10-CM | POA: Diagnosis not present

## 2022-11-03 DIAGNOSIS — E039 Hypothyroidism, unspecified: Secondary | ICD-10-CM | POA: Diagnosis not present

## 2022-11-03 DIAGNOSIS — I739 Peripheral vascular disease, unspecified: Secondary | ICD-10-CM | POA: Diagnosis not present

## 2022-11-03 DIAGNOSIS — I129 Hypertensive chronic kidney disease with stage 1 through stage 4 chronic kidney disease, or unspecified chronic kidney disease: Secondary | ICD-10-CM | POA: Diagnosis not present

## 2022-11-03 DIAGNOSIS — N183 Chronic kidney disease, stage 3 unspecified: Secondary | ICD-10-CM | POA: Diagnosis not present

## 2022-11-03 DIAGNOSIS — I679 Cerebrovascular disease, unspecified: Secondary | ICD-10-CM | POA: Diagnosis not present

## 2022-11-19 DIAGNOSIS — Z7409 Other reduced mobility: Secondary | ICD-10-CM | POA: Diagnosis not present

## 2022-11-19 DIAGNOSIS — N183 Chronic kidney disease, stage 3 unspecified: Secondary | ICD-10-CM | POA: Diagnosis not present

## 2022-11-19 DIAGNOSIS — F419 Anxiety disorder, unspecified: Secondary | ICD-10-CM | POA: Diagnosis not present

## 2022-11-19 DIAGNOSIS — E785 Hyperlipidemia, unspecified: Secondary | ICD-10-CM | POA: Diagnosis not present

## 2022-11-19 DIAGNOSIS — G629 Polyneuropathy, unspecified: Secondary | ICD-10-CM | POA: Diagnosis not present

## 2022-11-19 DIAGNOSIS — E039 Hypothyroidism, unspecified: Secondary | ICD-10-CM | POA: Diagnosis not present

## 2022-11-19 DIAGNOSIS — I480 Paroxysmal atrial fibrillation: Secondary | ICD-10-CM | POA: Diagnosis not present

## 2022-11-19 DIAGNOSIS — I129 Hypertensive chronic kidney disease with stage 1 through stage 4 chronic kidney disease, or unspecified chronic kidney disease: Secondary | ICD-10-CM | POA: Diagnosis not present

## 2022-11-19 DIAGNOSIS — R053 Chronic cough: Secondary | ICD-10-CM | POA: Diagnosis not present

## 2022-11-19 DIAGNOSIS — I739 Peripheral vascular disease, unspecified: Secondary | ICD-10-CM | POA: Diagnosis not present

## 2022-12-01 ENCOUNTER — Other Ambulatory Visit (HOSPITAL_COMMUNITY): Payer: Self-pay

## 2022-12-29 ENCOUNTER — Other Ambulatory Visit: Payer: Self-pay | Admitting: *Deleted

## 2022-12-29 DIAGNOSIS — I739 Peripheral vascular disease, unspecified: Secondary | ICD-10-CM

## 2022-12-29 DIAGNOSIS — I70213 Atherosclerosis of native arteries of extremities with intermittent claudication, bilateral legs: Secondary | ICD-10-CM

## 2023-01-06 NOTE — Progress Notes (Unsigned)
HISTORY AND PHYSICAL     CC:  follow up. Requesting Provider:  Josetta Huddle, MD  HPI: This is a 89 y.o. female who is here today for follow up for PAD.  Pt has hx of right below knee amputation.  She has hx of   Vascular surgery hx: -stent left EIA, drug coated balloon angioplasty of the left SFA 12/26/2014 [name] -stent left SFA, angioplasty left popliteal artery 10/30/2015 Dr. Arita Miss -atherectomy left SFA and popliteal artery, drug coated balloon angioplasty left SFA and popliteal artery 04/01/2016 [name] -drug coated balloon angioplasty right SFA and popliteal artery, stent right SFA 09/06/2016 [name]. -redo left femoral to AK popliteal artery bypass with PTFE, left CFA, EIA, and profunda femoral endarterectomy 09/24/2016 [name] -subintimal angioplasty left SFA, left SFA and BK popliteal vein graft stenting, balloon angioplasty of left AT 09/26/2016 Dr. Donzetta Matters -right CFA endarterectomy with bovine patch angioplasty, RLE thrombectomy 09/27/2016 Dr. Donzetta Matters -right TMA 12/11/2016 [name] -wound exploration right TMA 12/17/2016 [name] -right BKA 12/23/2016 [name] -left profunda to AT bypass with PTFE (redo femoral exposure) 01/16/2017 [name] -redo left femoral exposure with thrombectomy of left deep femoral artery  to AT bypass 12/28/2019 Dr. Scot Dock  -redo exposure left femoral AT bypass (BK incision), thrombectomy left femoral to AT bypass, stent of left femoral tibial bypass and profundo-femoral artery, angioplasty left ATA 05/04/2020 [name]  Pt was last seen 03/17/2022 by [name] and at that time, her duplex was essentially unchanged and she was scheduled for 6 month follow up.    The pt returns today for follow up.  ***  The pt is on a statin for cholesterol management.   (Praluent) The pt is not on an aspirin.    Other AC:  Plavix The pt is on ARB, diuretic, BB for hypertension.  The pt does not have diabetes. Tobacco hx:  former    Past  Medical History:  Diagnosis Date   Anemia    Anginal pain ([place])    Arthritis    "qwhere" (09/05/2016)   Atrial fibrillation ([place]) 09/2016   Chronic lower back pain    Complication of anesthesia    "takes a long time for it to wear off; I can hallucinate if I take too much" (09/05/2016)   DVT (deep venous thrombosis) ([place]) 10/2009   Fall from steps 08/31/2013   Fx. pelvis, Left Hip, Left Elbow   Fibromyalgia    GERD (gastroesophageal reflux disease)    09/22/16- "no too much anymore"   GI bleed 10/24/2016   High cholesterol    History of blood transfusion    History of hiatal hernia    Hypertension    Macular degeneration, wet ([place])    "started in right eye; now legally blind in that eye; now started in left eye but pretty much in control" (09/05/2016)   Osteoporosis    Peripheral vascular disease ([place])    nonviable tissue Right foot   PONV (postoperative nausea and vomiting)    Squamous cell carcinoma of skin of right calf Aug. 2015   Stroke [facility])    TIA's no residual   TIA (transient ischemic attack)    "several at once; none in a long time" (09/05/2016)    Past Surgical History:  Procedure Laterality Date   ABDOMINAL AORTAGRAM N/A 12/26/2014   Procedure: ABDOMINAL Maxcine Ham;  Surgeon: Serafina Mitchell, MD;  Location: [facility] CATH LAB;  Service: Cardiovascular;  Laterality: N/A;   AMPUTATION Right 12/23/2016  Procedure: AMPUTATION BELOW KNEE;  Surgeon: Serafina Mitchell, MD;  Location: [place];  Service: Vascular;  Laterality: Right;   AORTOGRAM N/A 09/26/2016   Procedure: AORTOGRAM;  Surgeon: Waynetta Sandy, MD;  Location: [place];  Service: Vascular;  Laterality: N/A;   CARPAL TUNNEL RELEASE Right    CATARACT EXTRACTION W/ INTRAOCULAR LENS  IMPLANT, BILATERAL Bilateral    COLONOSCOPY     DILATION AND CURETTAGE OF UTERUS     DRESSING CHANGE UNDER ANESTHESIA Right 01/16/2017   Procedure: DRESSING CHANGE RIGHT BELOW KNEE AMPUTATION;  Surgeon: Serafina Mitchell, MD;  Location: [place];  Service: Vascular;  Laterality: Right;   EYE SURGERY Right    "macular OR"   FEMORAL ARTERY STENT  12-11-10   Left SFA   FEMORAL-POPLITEAL BYPASS GRAFT Left 09/24/2016   Procedure: REDO FEMORAL TO POPLITEAL ARTERY BYPASS GRAFT USING 6MM PROPATEN RINGED GORTEX GRAFT;  Surgeon: Serafina Mitchell, MD;  [facility] OR;  Service: Vascular;  Laterality: Left;   FEMORAL-TIBIAL BYPASS GRAFT Left 01/16/2017   Procedure: REDO BYPASS GRAFT FEMORAL-TIBIAL ARTERY WITH GORTEX GRAFT;  Surgeon: Serafina Mitchell, MD;  Location: [facility] OR;  Service: Vascular;  Laterality: Left;  AND LOWER LEG    I & D EXTREMITY Right 12/17/2016   Procedure: IRRIGATION AND DEBRIDEMENT RIGHT FOOT;  Surgeon: Serafina Mitchell, MD;  Location: [facility] OR;  Service: Vascular;  Laterality: Right;   INCISION AND DRAINAGE OF WOUND Left 10/25/2009   leg/notes 11/13/2009   INSERTION OF ILIAC STENT Left 12/26/2014   Procedure: INSERTION OF ILIAC STENT;  Surgeon: Serafina Mitchell, MD;  Location: [place] CATH LAB;  Service: Cardiovascular;  Laterality: Left;   INSERTION OF ILIAC STENT Left 09/26/2016   Procedure: SUB INTIMAL INSERTION OF SUPERFICIAL FEMORAL ARTERY AND BELOW KNEE BYPASS GRAFT;  Surgeon: Waynetta Sandy, MD;  Location: [place];  Service: Vascular;  Laterality: Left;   INSERTION OF ILIAC STENT Left 05/04/2020   Procedure: INSERTION OF SUPERFICIAL FEMORAL ARTERY BYPASS GRAFT STENT;  Surgeon: Serafina Mitchell, MD;  Location: [place];  Service: Vascular;  Laterality: Left;   JOINT REPLACEMENT     knee   JOINT REPLACEMENT Left Oct. 17, 2014   Elbow ( pt fell 08-31-13 )   LOWER EXTREMITY ANGIOGRAM Left 12/27/2019   Procedure: Intraoperative Left Lower Extremity Angiogram;  Surgeon: Angelia Mould, MD;  Location: [place];  Service: Vascular;  Laterality: Left;   LOWER EXTREMITY ANGIOGRAPHY N/A 09/21/2018   Procedure: LOWER EXTREMITY ANGIOGRAPHY;  Surgeon: Serafina Mitchell, MD;  Location: [place] CV LAB;  Service: Cardiovascular;   Laterality: N/A;   ORIF SHOULDER FRACTURE Right    "it was crushed"   PATCH ANGIOPLASTY Left 05/04/2020   Procedure: BALLOON  ANGIOPLASTY;  Surgeon: Serafina Mitchell, MD;  Location: [facility] OR;  Service: Vascular;  Laterality: Left;   PERIPHERAL VASCULAR CATHETERIZATION N/A 10/30/2015   Procedure: Abdominal Aortogram w/Lower Extremity;  Surgeon: Serafina Mitchell, MD;  Location: [place] CV LAB;  Service: Cardiovascular;  Laterality: N/A;   PERIPHERAL VASCULAR CATHETERIZATION  10/30/2015   Procedure: Peripheral Vascular Intervention;  Surgeon: Serafina Mitchell, MD;  Location: [place] CV LAB;  Service: Cardiovascular;;   PERIPHERAL VASCULAR CATHETERIZATION N/A 04/01/2016   Procedure: Abdominal Aortogram w/Lower Extremity;  Surgeon: Serafina Mitchell, MD;  Location: [place] CV LAB;  Service: Cardiovascular;  Laterality: N/A;   PERIPHERAL VASCULAR CATHETERIZATION Left 04/01/2016   Procedure: Peripheral Vascular Atherectomy;  Surgeon: Serafina Mitchell,  MD;  Location: [place] CV LAB;  Service: Cardiovascular;  Laterality: Left;  Superficial femoral artery.   PERIPHERAL VASCULAR CATHETERIZATION Right 09/05/2016   "stent"   PERIPHERAL VASCULAR CATHETERIZATION N/A 09/05/2016   Procedure: Abdominal Aortogram w/Lower Extremity;  Surgeon: Serafina Mitchell, MD;  Location: [place] CV LAB;  Service: Cardiovascular;  Laterality: N/A;   PERIPHERAL VASCULAR CATHETERIZATION Right 09/05/2016   Procedure: Peripheral Vascular Intervention;  Surgeon: Serafina Mitchell, MD;  Location: [place] CV LAB;  Service: Cardiovascular;  Laterality: Right;  Superficial Femoral   PERIPHERAL VASCULAR CATHETERIZATION Left 09/09/2016   Procedure: Lower Extremity Angiography;  Surgeon: Serafina Mitchell, MD;  Location: [place] CV LAB;  Service: Cardiovascular;  Laterality: Left;   PR VEIN BYPASS GRAFT,AORTO-FEM-POP  09-13-09   Left Fem-pop   THROMBECTOMY FEMORAL ARTERY Right 09/26/2016   Procedure: Thromboembolectomy Right  Lower Extremity, Right Femoral Artery Endarterectomy with Patch Angioplasty; Right Lower Extremity Angiogram ;  Surgeon: Waynetta Sandy, MD;  Location: [place];  Service: Vascular;  Laterality: Right;   THROMBECTOMY FEMORAL ARTERY Left 12/27/2019   Procedure: Redo left femoral artery exposure, Thrombectomy of left deep femoral artery to anterior tibial artery bypass;  Surgeon: Angelia Mould, MD;  Location: [facility] OR;  Service: Vascular;  Laterality: Left;   THROMBECTOMY FEMORAL ARTERY Left 05/04/2020   Procedure: THROMBECTOMY LEFT LEG;  Surgeon: Serafina Mitchell, MD;  Location: [facility] OR;  Service: Vascular;  Laterality: Left;   TOTAL ELBOW ARTHROPLASTY Left 09/03/2013   Procedure: LEFT TOTAL ELBOW ARTHROPLASTY;  Surgeon: Roseanne Kaufman, MD;  Location: [place];  Service: Orthopedics;  Laterality: Left;   TOTAL KNEE ARTHROPLASTY Left 06/2006   TRANSMETATARSAL AMPUTATION Right 12/11/2016   Procedure: TRANSMETATARSAL AMPUTATION;  Surgeon: Serafina Mitchell, MD;  [facility] OR;  Service: Vascular;  Laterality: Right;   TUBAL LIGATION     VAGINAL HYSTERECTOMY      Allergies  Allergen Reactions   Motrin [Ibuprofen] Other (See Comments)    ADVERSE REACTION - GI BLEED   Statins Other (See Comments)    ADVERSE REACTION MUSCLE PAIN & WEAKNESS   Amitriptyline Other (See Comments)    Unknown reaction per daughter, unable to tolerate   Augmentin [Amoxicillin-Pot Clavulanate] Diarrhea   Doxycycline Diarrhea   Eliquis [Apixaban] Other (See Comments)    Lower GI bleeding   Ezetimibe Other (See Comments)   Lisinopril Cough   Lisinopril-Hydrochlorothiazide     Other reaction(s): felt "bad" on   Niacin And Related Other (See Comments)   Nitrofurantoin Other (See Comments)   Paxil [Paroxetine] Other (See Comments)   Morphine And Related Other (See Comments)    HALLUCINATIONS REACTION IS SIDE EFFECT   Promethazine Hcl Other (See Comments)    IV causes confusion, oral better   Sulfa Antibiotics  Nausea And Vomiting    Current Outpatient Medications  Medication Sig Dispense Refill   acetaminophen (TYLENOL) 500 MG tablet Take 1,000 mg by mouth every 8 (eight) hours as needed for mild pain.      Alirocumab (PRALUENT) 150 MG/ML SOAJ INJECT 150 MG INTO THE SKIN EVERY 14 (FOURTEEN) DAYS 2 mL 11   clopidogrel (PLAVIX) 75 MG tablet Take 1 tablet (75 mg total) by mouth daily. 30 tablet 1   Cyanocobalamin (VITAMIN B-12 PO) Place 1 tablet under the tongue daily.     diazepam (VALIUM) 5 MG tablet Take 0.5 tablets (2.5 mg total) by mouth at bedtime as needed for anxiety. 20 tablet 0   diphenoxylate-atropine (LOMOTIL)  2.5-0.025 MG tablet Take 1 tablet by mouth as needed for diarrhea or loose stools.     furosemide (LASIX) 20 MG tablet Take 1 tablet on Monday- Wednesday - Friday and 1/2 tablet on Sundays 90 tablet 3   gabapentin (NEURONTIN) 300 MG capsule Take 300 mg by mouth See admin instructions. Take 349m by mouth three times daily.  May take an additional 3045mat bedtime as needed for pain.     isosorbide mononitrate (IMDUR) 30 MG 24 hr tablet TAKE ONE TABLET BY MOUTH EVERY EVENING 90 tablet 1   levothyroxine (SYNTHROID) 50 MCG tablet Take 50 mcg by mouth every morning.     loperamide (IMODIUM) 2 MG capsule Take 2 mg by mouth See admin instructions. Take 25m59my mouth as needed after each loose stool.  Do not exceed 8 capsules in 24 hours.     metoprolol tartrate (LOPRESSOR) 25 MG tablet Take 1 tablet (25 mg total) by mouth at bedtime. (Patient taking differently: Take 25 mg by mouth 2 (two) times daily.) 30 tablet 1   Multiple Vitamins-Minerals (IMMUNE SUPPORT VITAMIN C PO) Take 1 tablet by mouth 2 (two) times daily.      Multiple Vitamins-Minerals (PRESERVISION AREDS PO) Take 1 capsule by mouth in the morning and at bedtime.     oxycodone (OXY-IR) 5 MG capsule Take 1 capsule (5 mg total) by mouth every 6 (six) hours as needed for pain. 15 capsule 0   potassium chloride (K-DUR,KLOR-CON) 10 MEQ  tablet Take 10 mEq by mouth daily. Take 1 and a half tablet on Sundays     valsartan-hydrochlorothiazide (DIOVAN-HCT) 160-12.5 MG tablet Take 0.5 tablets by mouth daily. 30 tablet 0   VITAMIN D PO Take 1,000 Units by mouth daily.     No current facility-administered medications for this visit.    Family History  Problem Relation Age of Onset   Heart disease Father        Heart Disease before age 87 71Hyperlipidemia Father    Hypertension Father    Alcohol abuse Father    Heart disease Brother    Hyperlipidemia Brother    Hypertension Brother    Deep vein thrombosis Brother    Heart disease Son        Heart Disease before age 87 53Hyperlipidemia Son    Hypertension Son    Heart attack Son    Diabetes Son    Hypertension Son    Hyperlipidemia Sister    Hypertension Sister     Social History   Socioeconomic History   Marital status: Widowed    Spouse name: Not on file   Number of children: Not on file   Years of education: Not on file   Highest education level: Not on file  Occupational History   Not on file  Tobacco Use   Smoking status: Former    Types: Cigarettes    Quit date: 11/17/1946    Years since quitting: 76.1   Smokeless tobacco: Never   Tobacco comments:    "never smoked much"  Vaping Use   Vaping Use: Never used  Substance and Sexual Activity   Alcohol use: No    Alcohol/week: 0.0 standard drinks of alcohol   Drug use: No   Sexual activity: Not on file  Other Topics Concern   Not on file  Social History Narrative   Not on file   Social Determinants of Health   Financial Resource Strain: Not on file  Food  Insecurity: Not on file  Transportation Needs: Not on file  Physical Activity: Not on file  Stress: Not on file  Social Connections: Not on file  Intimate Partner Violence: Not on file     REVIEW OF SYSTEMS:  *** [X]$  denotes positive finding, [ ]$  denotes negative finding Cardiac  Comments:  Chest pain or chest pressure:    Shortness  of breath upon exertion:    Short of breath when lying flat:    Irregular heart rhythm:        Vascular    Pain in calf, thigh, or hip brought on by ambulation:    Pain in feet at night that wakes you up from your sleep:     Blood clot in your veins:    Leg swelling:         Pulmonary    Oxygen at home:    Productive cough:     Wheezing:         Neurologic    Sudden weakness in arms or legs:     Sudden numbness in arms or legs:     Sudden onset of difficulty speaking or slurred speech:    Temporary loss of vision in one eye:     Problems with dizziness:         Gastrointestinal    Blood in stool:     Vomited blood:         Genitourinary    Burning when urinating:     Blood in urine:        Psychiatric    Major depression:         Hematologic    Bleeding problems:    Problems with blood clotting too easily:        Skin    Rashes or ulcers:        Constitutional    Fever or chills:      PHYSICAL EXAMINATION:  ***  General:  WDWN in NAD; vital signs documented above Gait: Not observed HENT: WNL, normocephalic Pulmonary: normal non-labored breathing , without wheezing Cardiac: {Desc; regular/irreg:14544} HR, {With/Without:20273} carotid bruit*** Abdomen: soft, NT; aortic pulse is *** palpable Skin: {With/Without:20273} rashes Vascular Exam/Pulses:  Right Left  Radial {Exam; arterial pulse strength 0-4:30167} {Exam; arterial pulse strength 0-4:30167}  Femoral {Exam; arterial pulse strength 0-4:30167} {Exam; arterial pulse strength 0-4:30167}  Popliteal {Exam; arterial pulse strength 0-4:30167} {Exam; arterial pulse strength 0-4:30167}  DP {Exam; arterial pulse strength 0-4:30167} {Exam; arterial pulse strength 0-4:30167}  PT {Exam; arterial pulse strength 0-4:30167} {Exam; arterial pulse strength 0-4:30167}  Peroneal *** ***   Extremities: {With/Without:20273} ischemic changes, {With/Without:20273} Gangrene , {With/Without:20273} cellulitis;  {With/Without:20273} open wounds Musculoskeletal: no muscle wasting or atrophy  Neurologic: A&O X 3 Psychiatric:  The pt has {Desc; normal/abnormal:11317::"Normal"} affect.   Non-Invasive Vascular Imaging:   ABI's/TBI's on 01/08/2023: Right:  BKA Left:  *** - Great toe pressure: ***  Arterial duplex on 01/08/2023: ***  Previous ABI's/TBI's on 03/17/2022: Right:  BKA Left:  0.64/0.26 - Great toe pressure: 46  Previous arterial duplex on 03/17/2022: Left Graft #1: Femoral-anterior tibial artery  +--------------------+--------+---------------+----------+-----------------                     PSV cm/sStenosis       Waveform  Comments          +--------------------+--------+---------------+----------+-----------------  Inflow             207     50-74% stenosismonophasic                  +--------------------+--------+---------------+----------+-----------------  Proximal Anastomosis172     50-70% stenosismonophasic                   +--------------------+--------+---------------+----------+-----------------  Proximal Graft      32                     monophasic                  +--------------------+--------+---------------+----------+-----------------  Mid Graft           35                     monophasic                  +--------------------+--------+---------------+----------+-----------------  Distal Graft        38                     monophasic                  +--------------------+--------+---------------+----------+-----------------  Distal Anastomosis  36                     monophasic                  +--------------------+--------+---------------+----------+-----------------  Outflow            28                     monophasicappears to  occlude approximately 1 cm from the anastomosis   +--------------------+--------+---------------+----------+-----------------      ASSESSMENT/PLAN:: 89 y.o. female here for follow up for PAD with  extensive hx of LLE revascularization (see HPI) and right BKA   -*** -continue *** -pt will f/u in *** with ***.   Leontine Locket, [facility] [phone]  Clinic MD:   Scot Dock

## 2023-01-08 ENCOUNTER — Ambulatory Visit (INDEPENDENT_AMBULATORY_CARE_PROVIDER_SITE_OTHER): Payer: Medicare Other | Admitting: Physician Assistant

## 2023-01-08 ENCOUNTER — Ambulatory Visit (HOSPITAL_COMMUNITY)
Admission: RE | Admit: 2023-01-08 | Discharge: 2023-01-08 | Disposition: A | Discharge status: Home / Self Care | Payer: Medicare Other | Source: Ambulatory Visit | Attending: Vascular Surgery | Admitting: Vascular Surgery

## 2023-01-08 ENCOUNTER — Ambulatory Visit (INDEPENDENT_AMBULATORY_CARE_PROVIDER_SITE_OTHER)
Admission: RE | Admit: 2023-01-08 | Discharge: 2023-01-08 | Disposition: A | Discharge status: Home / Self Care | Payer: Medicare Other | Source: Ambulatory Visit | Attending: Vascular Surgery | Admitting: Vascular Surgery

## 2023-01-08 VITALS — BP 158/78 | HR 68 | Temp 98.1°F | Resp 20 | Ht 62.0 in

## 2023-01-08 DIAGNOSIS — I739 Peripheral vascular disease, unspecified: Secondary | ICD-10-CM | POA: Diagnosis not present

## 2023-01-08 DIAGNOSIS — I70213 Atherosclerosis of native arteries of extremities with intermittent claudication, bilateral legs: Secondary | ICD-10-CM

## 2023-01-08 LAB — VAS US ABI WITH/WO TBI: Left ABI: 0.78

## 2023-01-10 ENCOUNTER — Other Ambulatory Visit: Payer: Self-pay

## 2023-01-10 DIAGNOSIS — I70213 Atherosclerosis of native arteries of extremities with intermittent claudication, bilateral legs: Secondary | ICD-10-CM

## 2023-01-10 DIAGNOSIS — I739 Peripheral vascular disease, unspecified: Secondary | ICD-10-CM

## 2023-01-13 DIAGNOSIS — R059 Cough, unspecified: Secondary | ICD-10-CM | POA: Diagnosis not present

## 2023-02-18 DIAGNOSIS — I10 Essential (primary) hypertension: Secondary | ICD-10-CM | POA: Diagnosis not present

## 2023-03-17 ENCOUNTER — Other Ambulatory Visit: Payer: Self-pay

## 2023-03-17 ENCOUNTER — Other Ambulatory Visit: Payer: Self-pay | Admitting: Pharmacist

## 2023-03-17 MED ORDER — PRALUENT 150 MG/ML ~~LOC~~ SOAJ: 150.0000 mg | SUBCUTANEOUS | 11 refills | Status: DC

## 2023-03-17 MED ORDER — PRALUENT 150 MG/ML ~~LOC~~ SOAJ: 150.0000 mg | SUBCUTANEOUS | 2 refills | Status: DC

## 2023-04-14 DIAGNOSIS — E038 Other specified hypothyroidism: Secondary | ICD-10-CM | POA: Diagnosis not present

## 2023-05-10 NOTE — Progress Notes (Unsigned)
Cardiology Office Note:   Date:  05/13/2023  ID:  [name], DOB May 10, 1928, MRN [id number]  History of Present Illness:   [name] is a 89 y.o. female pAfib, history of lower GIB now off AC, HTN, HLD, PVD s/p right BKA in 2018, fem-pop bypass of LLE s/p LLE thrombectomy of her graft, right BKA, prior CVA, and GERD who presents to clinic for follow-up.  Was last seen by Herma Carson 10/2022 where she was having some wheezing on days she was not taking lasix. She was given lasix 10mg  daily on those days. Otherwise was stable from a CV standpoint.  Today, the patient overall feels okay. Her daughter is concerned that she has been having dyspnea on exertion as well as intermittent wheezing. Has been sleeping propped up and feels more SOB with laying flat (this is not new). No PND. Also notes intermittent chest tightness and abdominal bloating. No significant palpitations.  Patient is DNR and wishes to avoid invasive measures unless improves quality of life.  Past Medical History:  Diagnosis Date   Anemia    Anginal pain (HCC)    Arthritis    "qwhere" (09/05/2016)   Atrial fibrillation (HCC) 09/2016   Chronic lower back pain    Complication of anesthesia    "takes a long time for it to wear off; I can hallucinate if I take too much" (09/05/2016)   DVT (deep venous thrombosis) (HCC) 10/2009   Fall from steps 08/31/2013   Fx. pelvis, Left Hip, Left Elbow   Fibromyalgia    GERD (gastroesophageal reflux disease)    09/22/16- "no too much anymore"   GI bleed 10/24/2016   High cholesterol    History of blood transfusion    History of hiatal hernia    Hypertension    Macular degeneration, wet (HCC)    "started in right eye; now legally blind in that eye; now started in left eye but pretty much in control" (09/05/2016)   Osteoporosis    Peripheral vascular disease (HCC)    nonviable tissue Right foot   PONV (postoperative nausea and vomiting)    Squamous cell carcinoma of  skin of right calf Aug. 2015   Stroke [facility])    TIA's no residual   TIA (transient ischemic attack)    "several at once; none in a long time" (09/05/2016)     ROS: As per HPI  Studies Reviewed:    EKG:  NSR, PAC, inferolateral infarct pattern  Cardiac Studies & Procedures     STRESS TESTS  NM [facility] MULTI W/SPECT W 09/19/2016  Narrative CLINICAL DATA:  Preop radiograph.  EXAM: MYOCARDIAL IMAGING WITH SPECT (REST AND PHARMACOLOGIC-STRESS)  GATED LEFT VENTRICULAR WALL MOTION STUDY  LEFT VENTRICULAR EJECTION FRACTION  TECHNIQUE: Standard myocardial SPECT imaging was performed after resting intravenous injection of 10 mCi Tc-81m tetrofosmin. Subsequently, intravenous infusion of Lexiscan was performed under the supervision of the Cardiology staff. At peak effect of the drug, 30 mCi Tc-65m tetrofosmin was injected intravenously and standard myocardial SPECT imaging was performed. Quantitative gated imaging was also performed to evaluate left ventricular wall motion, and estimate left ventricular ejection fraction.  COMPARISON:  09/08/2009  FINDINGS: Perfusion: No decreased activity in the left ventricle on stress imaging to suggest reversible ischemia or infarction.  Wall Motion: Normal left ventricular wall motion. No left ventricular dilation.  Left Ventricular Ejection Fraction: 73 %, previously 84%  End diastolic volume 38 ml  End systolic volume 10 ml  IMPRESSION: 1. No  reversible ischemia or infarction.  2. Normal left ventricular wall motion.  3. Left ventricular ejection fraction 73%  4. Non invasive risk stratification*: Low  *2012 Appropriate Use Criteria for Coronary Revascularization Focused Update: J Am Coll Cardiol. 2012;59(9):857-881. http://content.dementiazones.com.aspx?articleid=1201161   Electronically Signed By: Signa Kell M.D. On: 09/19/2016 13:54   ECHOCARDIOGRAM  ECHOCARDIOGRAM COMPLETE  12/30/2019  Narrative ECHOCARDIOGRAM REPORT    Patient Name:   [name] Date of Exam: 12/30/2019 Medical Rec #:  [id number]        Height:       62.0 in Accession #:    [id number]       Weight:       132.7 lb Date of Birth:  June 11, 1928       BSA:          1.61 m Patient Age:    89 years         BP:           137/50 mmHg Patient Gender: F                HR:           99 bpm. Exam Location:  Inpatient  Procedure: 2D Echo, Cardiac Doppler and Color Doppler  Indications:    Atrial Fibrillation 427.31  History:        Patient has prior history of Echocardiogram examinations, most recent 06/24/2018. Arrythmias:Atrial Fibrillation; Risk Factors:Hypertension, Dyslipidemia and Former Smoker. PAD. PVD.  Sonographer:    Renella Cunas RDCS Referring Phys: [id number] Beatriz Stallion  IMPRESSIONS   1. Left ventricular ejection fraction, by estimation, is 60 to 65%. The left ventricle has hyperdynamic function. The left ventricle has no regional wall motion abnormalities. There is basal septal hypertrophy left ventricular hypertrophy. Left ventricular diastolic parameters were normal. 2. Right ventricular systolic function is normal. The right ventricular size is normal. 3. Left atrial size was mildly dilated. 4. The mitral valve is normal in structure and function. Mild mitral valve regurgitation. No evidence of mitral stenosis. 5. The aortic valve is tricuspid. Aortic valve regurgitation is trivial . Mild aortic valve sclerosis is present, with no evidence of aortic valve stenosis.  FINDINGS Left Ventricle: Left ventricular ejection fraction, by estimation, is 60 to 65%. The left ventricle has hyperdynamic function. The left ventricle has no regional wall motion abnormalities. The left ventricular internal cavity size was small. There is no basal septal hypertrophy left ventricular hypertrophy. Left ventricular diastolic parameters were normal.  Right Ventricle: The right ventricular size  is normal. No increase in right ventricular wall thickness. Right ventricular systolic function is normal.  Left Atrium: Left atrial size was mildly dilated.  Right Atrium: Right atrial size was normal in size.  Pericardium: There is no evidence of pericardial effusion.  Mitral Valve: The mitral valve is normal in structure and function. There is mild thickening of the mitral valve leaflet(s). There is mild calcification of the mitral valve leaflet(s). Normal mobility of the mitral valve leaflets. Mild to moderate mitral annular calcification. Mild mitral valve regurgitation. No evidence of mitral valve stenosis.  Tricuspid Valve: The tricuspid valve is normal in structure. Tricuspid valve regurgitation is trivial. No evidence of tricuspid stenosis.  Aortic Valve: The aortic valve is tricuspid. Aortic valve regurgitation is trivial. Mild aortic valve sclerosis is present, with no evidence of aortic valve stenosis.  Pulmonic Valve: The pulmonic valve was normal in structure. Pulmonic valve regurgitation is trivial. No evidence of pulmonic stenosis.  Aorta: The  aortic root is normal in size and structure.  IAS/Shunts: The interatrial septum was not well visualized.   LEFT VENTRICLE PLAX 2D LVIDd:         4.64 cm     Diastology LVIDs:         3.31 cm     LV e' lateral:   7.27 cm/s LV PW:         0.69 cm     LV E/e' lateral: 13.0 LV IVS:        0.70 cm     LV e' medial:    5.34 cm/s LVOT diam:     1.70 cm     LV E/e' medial:  17.7 LV SV:         36.09 ml LV SV Index:   33.60 LVOT Area:     2.27 cm  LV Volumes (MOD) LV vol d, MOD A2C: 41.5 ml LV vol d, MOD A4C: 56.1 ml LV vol s, MOD A2C: 17.3 ml LV vol s, MOD A4C: 20.6 ml LV SV MOD A2C:     24.2 ml LV SV MOD A4C:     56.1 ml LV SV MOD BP:      29.8 ml  RIGHT VENTRICLE RV S prime:     12.30 cm/s TAPSE (M-mode): 1.8 cm  LEFT ATRIUM             Index       RIGHT ATRIUM           Index LA diam:        3.60 cm 2.24 cm/m  RA  Area:     10.40 cm LA Vol (A2C):   42.7 ml 26.59 ml/m RA Volume:   25.10 ml  15.63 ml/m LA Vol (A4C):   52.2 ml 32.50 ml/m LA Biplane Vol: 48.3 ml 30.08 ml/m AORTIC VALVE LVOT Vmax:   87.50 cm/s LVOT Vmean:  58.400 cm/s LVOT VTI:    0.159 m  AORTA Ao Root diam: 3.30 cm  MITRAL VALVE MV Area (PHT): 5.13 cm    SHUNTS MV Decel Time: 148 msec    Systemic VTI:  0.16 m MV E velocity: 94.30 cm/s  Systemic Diam: 1.70 cm MV A velocity: 63.80 cm/s MV E/A ratio:  1.48  Charlton Haws MD Electronically signed by Charlton Haws MD Signature Date/Time: 12/30/2019/3:28:47 PM    Final    MONITORS  LONG TERM MONITOR (3-14 DAYS) 02/13/2021  Narrative  The basic underlying rhythm is normal sinus rhythm  Rare PACs and PVCs are noted  Nonsustained SVT up to 5 beats is noted.  No atrial fibrillation, ventricular tachycardia, or frequent ventricular ectopy is noted.  Overall unremarkable study.  No atrial fibrillation.  Premature beats are noted.  Continue amiodarone.      Patch Wear Time:  7 days and 1 hours (2022-03-14T18:48:01-399 to 2022-03-21T19:51:57-0400)  Patient had a min HR of 48 bpm, max HR of 102 bpm, and avg HR of 60 bpm. Predominant underlying rhythm was Sinus Rhythm. 1 run of Supraventricular Tachycardia occurred lasting 5 beats with a max rate of 102 bpm (avg 100 bpm). Isolated SVEs were rare (<1.0%), SVE Couplets were rare (<1.0%), and SVE Triplets were rare (<1.0%). Isolated VEs were rare (<1.0%), and no VE Couplets or VE Triplets were present.            Risk Assessment/Calculations:    CHA2DS2-VASc Score = 7   This indicates a 11.2% annual risk of stroke. The patient's score is  based upon: CHF History: 1 HTN History: 1 Diabetes History: 0 Stroke History: 2 Vascular Disease History: 0 Age Score: 2 Gender Score: 1            Physical Exam:   VS:  BP 116/82   Ht 5\' 2"  (1.575 m)   Wt 119 lb (54 kg)   SpO2 95%   BMI 21.77 kg/m    Wt Readings from  Last 3 Encounters:  05/13/23 119 lb (54 kg)  10/29/22 119 lb (54 kg)  06/23/22 124 lb (56.2 kg)     GEN: Well nourished, well developed in no acute distress NECK: JVD mildly elevated CARDIAC: RRR, 1/6 systolic murmur RESPIRATORY:  Faint crackles at left lung base. Otherwise diminished but clear ABDOMEN: Soft, non-tender, non-distended EXTREMITIES:  Right BKA, LLE luke-warm with 1+ edema, tender to palpation  ASSESSMENT AND PLAN:   #DOE: -Patient with progressive DOE with minimal activity with associated wheezing -Suspect she is mildly volume up on exam but also with significant risk factors for CAD -Will check TTE and BNP  -Discussed that the goal to improve symptoms with no plans to proceed with invasive work-up unless TTE concerning or symptoms significantly worsen per patient preference  -Continue lasix 20mg  M, W, F and 10mg  on Sunday as patient struggles with increasing dosing due to urinary frequency; suspect she will need an increase in dosing but will await labs per patient preference  #PAD: -S/p right BKA and several procedures on her left now with occluded bypass; following very closely with vascular and currently managed medically as no rest pain or wounds -Continue praluent -Continue plavix 75mg  daily  #pAfib: -Off AC due to history of GIB -Continue metop 25mg  q HS  #History of CVA: -Continue praluent -Continue plavix 75mg  daily  #HLD: -Continue praluent  #HTN: -Continue valsaratn-hydrochlorothiazide 160-12.5mg  daily -Continue imdur 30mg  daily -Continue metop 25mg  qHS        Signed, Meriam Sprague, MD

## 2023-05-13 ENCOUNTER — Ambulatory Visit: Payer: Medicare Other | Attending: Cardiology | Admitting: Cardiology

## 2023-05-13 ENCOUNTER — Encounter: Payer: Self-pay | Admitting: Cardiology

## 2023-05-13 VITALS — BP 116/82 | Ht 62.0 in | Wt 119.0 lb

## 2023-05-13 DIAGNOSIS — Z8673 Personal history of transient ischemic attack (TIA), and cerebral infarction without residual deficits: Secondary | ICD-10-CM | POA: Insufficient documentation

## 2023-05-13 DIAGNOSIS — I480 Paroxysmal atrial fibrillation: Secondary | ICD-10-CM | POA: Insufficient documentation

## 2023-05-13 DIAGNOSIS — I739 Peripheral vascular disease, unspecified: Secondary | ICD-10-CM | POA: Insufficient documentation

## 2023-05-13 DIAGNOSIS — R0602 Shortness of breath: Secondary | ICD-10-CM | POA: Insufficient documentation

## 2023-05-13 DIAGNOSIS — I10 Essential (primary) hypertension: Secondary | ICD-10-CM | POA: Insufficient documentation

## 2023-05-13 DIAGNOSIS — E785 Hyperlipidemia, unspecified: Secondary | ICD-10-CM | POA: Diagnosis not present

## 2023-05-13 NOTE — Patient Instructions (Signed)
Medication Instructions:   Your physician recommends that you continue on your current medications as directed. Please refer to the Current Medication list given to you today.  *If you need a refill on your cardiac medications before your next appointment, please call your pharmacy*   Lab Work:  TODAY--BMET, PRO-BNP, AND CBC W DIFF  If you have labs (blood work) drawn today and your tests are completely normal, you will receive your results only by: MyChart Message (if you have MyChart) OR A paper copy in the mail If you have any lab test that is abnormal or we need to change your treatment, we will call you to review the results.   Testing/Procedures:  Your physician has requested that you have an echocardiogram. Echocardiography is a painless test that uses sound waves to create images of your heart. It provides your doctor with information about the size and shape of your heart and how well your heart's chambers and valves are working. This procedure takes approximately one hour. There are no restrictions for this procedure. Please do NOT wear cologne, perfume, aftershave, or lotions (deodorant is allowed). Please arrive 15 minutes prior to your appointment time.    Follow-Up: At [facility], you and your health needs are our priority.  As part of our continuing mission to provide you with exceptional heart care, we have created designated Provider Care Teams.  These Care Teams include your primary Cardiologist (physician) and Advanced Practice Providers (APPs -  Physician Assistants and Nurse Practitioners) who all work together to provide you with the care you need, when you need it.  We recommend signing up for the patient portal called "MyChart".  Sign up information is provided on this After Visit Summary.  MyChart is used to connect with patients for Virtual Visits (Telemedicine).  Patients are able to view lab/test results, encounter notes, upcoming appointments, etc.   Non-urgent messages can be sent to your provider as well.   To learn more about what you can do with MyChart, go to [email or web address].    Your next appointment:   6 month(s)  Provider:   DR. Clifton James

## 2023-05-14 LAB — BASIC METABOLIC PANEL
BUN/Creatinine Ratio: 27 (ref 12–28)
BUN: 30 mg/dL (ref 10–36)
CO2: 21 mmol/L (ref 20–29)
Calcium: 9.3 mg/dL (ref 8.7–10.3)
Chloride: 99 mmol/L (ref 96–106)
Creatinine, Ser: 1.11 mg/dL — ABNORMAL HIGH (ref 0.57–1.00)
Glucose: 89 mg/dL (ref 70–99)
Potassium: 4.3 mmol/L (ref 3.5–5.2)
Sodium: 142 mmol/L (ref 134–144)
eGFR: 47 mL/min/{1.73_m2} — ABNORMAL LOW (ref >59)

## 2023-05-14 LAB — CBC WITH DIFFERENTIAL/PLATELET
Basophils Absolute: 0.1 10*3/uL (ref 0.0–0.2)
Basos: 1 %
EOS (ABSOLUTE): 0.6 10*3/uL — ABNORMAL HIGH (ref 0.0–0.4)
Eos: 7 %
Hematocrit: 39 % (ref 34.0–46.6)
Hemoglobin: 12.8 g/dL (ref 11.1–15.9)
Immature Grans (Abs): 0.1 10*3/uL (ref 0.0–0.1)
Immature Granulocytes: 1 %
Lymphocytes Absolute: 2.4 10*3/uL (ref 0.7–3.1)
Lymphs: 31 %
MCH: 31.4 pg (ref 26.6–33.0)
MCHC: 32.8 g/dL (ref 31.5–35.7)
MCV: 96 fL (ref 79–97)
Monocytes Absolute: 0.9 10*3/uL (ref 0.1–0.9)
Monocytes: 12 %
Neutrophils Absolute: 3.8 10*3/uL (ref 1.4–7.0)
Neutrophils: 48 %
Platelets: 217 10*3/uL (ref 150–450)
RBC: 4.07 x10E6/uL (ref 3.77–5.28)
RDW: 13.1 % (ref 11.7–15.4)
WBC: 7.8 10*3/uL (ref 3.4–10.8)

## 2023-05-14 LAB — PRO B NATRIURETIC PEPTIDE: NT-Pro BNP: 331 pg/mL (ref 0–738)

## 2023-05-27 DIAGNOSIS — I739 Peripheral vascular disease, unspecified: Secondary | ICD-10-CM | POA: Diagnosis not present

## 2023-05-27 DIAGNOSIS — Z89519 Acquired absence of unspecified leg below knee: Secondary | ICD-10-CM | POA: Diagnosis not present

## 2023-05-27 DIAGNOSIS — F419 Anxiety disorder, unspecified: Secondary | ICD-10-CM | POA: Diagnosis not present

## 2023-05-27 DIAGNOSIS — G8929 Other chronic pain: Secondary | ICD-10-CM | POA: Diagnosis not present

## 2023-05-27 DIAGNOSIS — Z7902 Long term (current) use of antithrombotics/antiplatelets: Secondary | ICD-10-CM | POA: Diagnosis not present

## 2023-05-27 DIAGNOSIS — I509 Heart failure, unspecified: Secondary | ICD-10-CM | POA: Diagnosis not present

## 2023-05-27 DIAGNOSIS — Z7409 Other reduced mobility: Secondary | ICD-10-CM | POA: Diagnosis not present

## 2023-05-27 DIAGNOSIS — M069 Rheumatoid arthritis, unspecified: Secondary | ICD-10-CM | POA: Diagnosis not present

## 2023-05-27 DIAGNOSIS — N183 Chronic kidney disease, stage 3 unspecified: Secondary | ICD-10-CM | POA: Diagnosis not present

## 2023-05-27 DIAGNOSIS — I2510 Atherosclerotic heart disease of native coronary artery without angina pectoris: Secondary | ICD-10-CM | POA: Diagnosis not present

## 2023-05-27 DIAGNOSIS — I4811 Longstanding persistent atrial fibrillation: Secondary | ICD-10-CM | POA: Diagnosis not present

## 2023-05-27 DIAGNOSIS — K580 Irritable bowel syndrome with diarrhea: Secondary | ICD-10-CM | POA: Diagnosis not present

## 2023-05-27 DIAGNOSIS — Z8673 Personal history of transient ischemic attack (TIA), and cerebral infarction without residual deficits: Secondary | ICD-10-CM | POA: Diagnosis not present

## 2023-05-27 DIAGNOSIS — I130 Hypertensive heart and chronic kidney disease with heart failure and stage 1 through stage 4 chronic kidney disease, or unspecified chronic kidney disease: Secondary | ICD-10-CM | POA: Diagnosis not present

## 2023-05-31 DIAGNOSIS — N390 Urinary tract infection, site not specified: Secondary | ICD-10-CM | POA: Diagnosis not present

## 2023-05-31 DIAGNOSIS — N1831 Chronic kidney disease, stage 3a: Secondary | ICD-10-CM | POA: Diagnosis not present

## 2023-06-08 ENCOUNTER — Ambulatory Visit (HOSPITAL_COMMUNITY)
Admission: RE | Admit: 2023-06-08 | Discharge: 2023-06-08 | Disposition: A | Discharge status: Home / Self Care | Payer: Medicare Other | Source: Ambulatory Visit | Attending: Vascular Surgery | Admitting: Vascular Surgery

## 2023-06-08 ENCOUNTER — Ambulatory Visit: Payer: Medicare Other | Admitting: Surgery

## 2023-06-08 DIAGNOSIS — I739 Peripheral vascular disease, unspecified: Secondary | ICD-10-CM | POA: Diagnosis not present

## 2023-06-08 DIAGNOSIS — I70213 Atherosclerosis of native arteries of extremities with intermittent claudication, bilateral legs: Secondary | ICD-10-CM | POA: Diagnosis not present

## 2023-06-08 LAB — VAS US ABI WITH/WO TBI: Left ABI: 0.53

## 2023-06-11 ENCOUNTER — Ambulatory Visit (HOSPITAL_COMMUNITY): Payer: Medicare Other | Attending: Cardiovascular Disease

## 2023-06-11 DIAGNOSIS — Z8673 Personal history of transient ischemic attack (TIA), and cerebral infarction without residual deficits: Secondary | ICD-10-CM

## 2023-06-11 DIAGNOSIS — I10 Essential (primary) hypertension: Secondary | ICD-10-CM | POA: Diagnosis not present

## 2023-06-11 DIAGNOSIS — I739 Peripheral vascular disease, unspecified: Secondary | ICD-10-CM | POA: Diagnosis not present

## 2023-06-11 DIAGNOSIS — I480 Paroxysmal atrial fibrillation: Secondary | ICD-10-CM | POA: Diagnosis not present

## 2023-06-11 DIAGNOSIS — R0602 Shortness of breath: Secondary | ICD-10-CM

## 2023-06-11 DIAGNOSIS — E785 Hyperlipidemia, unspecified: Secondary | ICD-10-CM

## 2023-06-11 LAB — ECHOCARDIOGRAM COMPLETE
Area-P 1/2: 4.06 cm2
P 1/2 time: 539 msec
S' Lateral: 2.2 cm

## 2023-07-06 ENCOUNTER — Encounter: Payer: Self-pay | Admitting: Surgery

## 2023-07-06 ENCOUNTER — Ambulatory Visit: Payer: Medicare Other | Admitting: Surgery

## 2023-07-06 VITALS — BP 132/72 | HR 100 | Temp 97.9°F | Resp 18 | Ht 62.0 in

## 2023-07-06 DIAGNOSIS — I739 Peripheral vascular disease, unspecified: Secondary | ICD-10-CM

## 2023-07-06 NOTE — Progress Notes (Signed)
[facility] [place]  Patient name: [name] MRN: [id number] DOB: [birth year] Sex: female   REASON FOR VISIT:    Follow up  HISOTRY OF PRESENT ILLNESS:    [name] is a 89 y.o. female who I have known for a long time.  She has undergone numerous vascular procedures, documented in her most recent note.  She has a right below-knee amputation.  She does have some phantom pain in this leg.  She does not have any patent bypasses in her left leg.  She does not have any open wounds or rest pain.   PAST MEDICAL HISTORY:   Past Medical History:  Diagnosis Date   Anemia    Anginal pain (HCC)    Arthritis    "qwhere" (09/05/2016)   Atrial fibrillation (HCC) 09/2016   Chronic lower back pain    Complication of anesthesia    "takes a long time for it to wear off; I can hallucinate if I take too much" (09/05/2016)   DVT (deep venous thrombosis) (HCC) 10/2009   Fall from steps 08/31/2013   Fx. pelvis, Left Hip, Left Elbow   Fibromyalgia    GERD (gastroesophageal reflux disease)    09/22/16- "no too much anymore"   GI bleed 10/24/2016   High cholesterol    History of blood transfusion    History of hiatal hernia    Hypertension    Macular degeneration, wet (HCC)    "started in right eye; now legally blind in that eye; now started in left eye but pretty much in control" (09/05/2016)   Osteoporosis    Peripheral vascular disease (HCC)    nonviable tissue Right foot   PONV (postoperative nausea and vomiting)    Squamous cell carcinoma of skin of right calf Aug. 2015   Stroke [facility])    TIA's no residual   TIA (transient ischemic attack)    "several at once; none in a long time" (09/05/2016)     FAMILY HISTORY:   Family History  Problem Relation Age of Onset   Heart disease Father        Heart Disease before age 11   Hyperlipidemia Father    Hypertension Father    Alcohol abuse Father    Heart disease  Brother    Hyperlipidemia Brother    Hypertension Brother    Deep vein thrombosis Brother    Heart disease Son        Heart Disease before age 73   Hyperlipidemia Son    Hypertension Son    Heart attack Son    Diabetes Son    Hypertension Son    Hyperlipidemia Sister    Hypertension Sister     SOCIAL HISTORY:   Social History   Tobacco Use   Smoking status: Former    Current packs/day: 0.00    Types: Cigarettes    Quit date: 11/17/1946    Years since quitting: 76.6    Passive exposure: Never   Smokeless tobacco: Never   Tobacco comments:    "never smoked much"  Substance Use Topics   Alcohol use: No    Alcohol/week: 0.0 standard drinks of alcohol     ALLERGIES:   Allergies  Allergen Reactions   Motrin [Ibuprofen] Other (See Comments)    ADVERSE REACTION - GI BLEED   Statins Other (See Comments)    ADVERSE REACTION MUSCLE PAIN & WEAKNESS   Amitriptyline Other (See Comments)    Unknown reaction per daughter,  unable to tolerate   Augmentin [Amoxicillin-Pot Clavulanate] Diarrhea   Doxycycline Diarrhea   Eliquis [Apixaban] Other (See Comments)    Lower GI bleeding   Ezetimibe Other (See Comments)   Lisinopril Cough   Lisinopril-Hydrochlorothiazide     Other reaction(s): felt "bad" on   Niacin And Related Other (See Comments)   Nitrofurantoin Other (See Comments)   Paxil [Paroxetine] Other (See Comments)   Morphine And Codeine Other (See Comments)    HALLUCINATIONS REACTION IS SIDE EFFECT   Promethazine Hcl Other (See Comments)    IV causes confusion, oral better   Sulfa Antibiotics Nausea And Vomiting     CURRENT MEDICATIONS:   Current Outpatient Medications  Medication Sig Dispense Refill   acetaminophen (TYLENOL) 500 MG tablet Take 1,000 mg by mouth every 8 (eight) hours as needed for mild pain.      Alirocumab (PRALUENT) 150 MG/ML SOAJ Inject 1 mL (150 mg total) into the skin every 14 (fourteen) days. 2 mL 11   clopidogrel (PLAVIX) 75 MG tablet Take  1 tablet (75 mg total) by mouth daily. 30 tablet 1   Cyanocobalamin (VITAMIN B-12 PO) Place 1 tablet under the tongue daily.     diazepam (VALIUM) 5 MG tablet Take 0.5 tablets (2.5 mg total) by mouth at bedtime as needed for anxiety. 20 tablet 0   diphenoxylate-atropine (LOMOTIL) 2.5-0.025 MG tablet Take 1 tablet by mouth as needed for diarrhea or loose stools.     furosemide (LASIX) 20 MG tablet Take 1 tablet on Monday- Wednesday - Friday and 1/2 tablet on Sundays 90 tablet 3   gabapentin (NEURONTIN) 300 MG capsule Take 300 mg by mouth See admin instructions. Take 300mg  by mouth three times daily.  May take an additional 300mg  at bedtime as needed for pain.     isosorbide mononitrate (IMDUR) 30 MG 24 hr tablet TAKE ONE TABLET BY MOUTH EVERY EVENING 90 tablet 1   levothyroxine (SYNTHROID) 50 MCG tablet Take 50 mcg by mouth every morning.     loperamide (IMODIUM) 2 MG capsule Take 2 mg by mouth See admin instructions. Take 2mg  by mouth as needed after each loose stool.  Do not exceed 8 capsules in 24 hours.     metoprolol tartrate (LOPRESSOR) 25 MG tablet Take 1 tablet (25 mg total) by mouth at bedtime. (Patient taking differently: Take 25 mg by mouth 2 (two) times daily.) 30 tablet 1   Multiple Vitamins-Minerals (IMMUNE SUPPORT VITAMIN C PO) Take 1 tablet by mouth 2 (two) times daily.      Multiple Vitamins-Minerals (PRESERVISION AREDS PO) Take 1 capsule by mouth in the morning and at bedtime.     oxycodone (OXY-IR) 5 MG capsule Take 1 capsule (5 mg total) by mouth every 6 (six) hours as needed for pain. 15 capsule 0   potassium chloride (K-DUR,KLOR-CON) 10 MEQ tablet Take 10 mEq by mouth daily. Take 1 and a half tablet on Sundays     valsartan-hydrochlorothiazide (DIOVAN-HCT) 160-12.5 MG tablet Take 0.5 tablets by mouth daily. 30 tablet 0   VITAMIN D PO Take 1,000 Units by mouth daily.     No current facility-administered medications for this visit.    REVIEW OF SYSTEMS:   [X]  denotes positive  finding, [ ]  denotes negative finding Cardiac  Comments:  Chest pain or chest pressure:    Shortness of breath upon exertion:    Short of breath when lying flat:    Irregular heart rhythm:  Vascular    Pain in calf, thigh, or hip brought on by ambulation:    Pain in feet at night that wakes you up from your sleep:     Blood clot in your veins:    Leg swelling:         Pulmonary    Oxygen at home:    Productive cough:     Wheezing:         Neurologic    Sudden weakness in arms or legs:     Sudden numbness in arms or legs:     Sudden onset of difficulty speaking or slurred speech:    Temporary loss of vision in one eye:     Problems with dizziness:         Gastrointestinal    Blood in stool:     Vomited blood:         Genitourinary    Burning when urinating:     Blood in urine:        Psychiatric    Major depression:         Hematologic    Bleeding problems:    Problems with blood clotting too easily:        Skin    Rashes or ulcers:        Constitutional    Fever or chills:      PHYSICAL EXAM:   Vitals:   07/06/23 0916  BP: 132/72  Pulse: 100  Resp: 18  Temp: 97.9 F (36.6 C)  SpO2: 93%  Height: 5\' 2"  (1.575 m)    GENERAL: The patient is a well-nourished female, in no acute distress. The vital signs are documented above. CARDIAC: There is a regular rate and rhythm.  PULMONARY: Non-labored respirations MUSCULOSKELETAL: There are no major deformities or cyanosis. NEUROLOGIC: No focal weakness or paresthesias are detected. SKIN: There are no ulcers or rashes noted. PSYCHIATRIC: The patient has a normal affect.  STUDIES:   None  MEDICAL ISSUES:   Left tibial bypass graft is occluded.  She does not have ulcers or rest pain.  She has no options remaining for revascularization.  She knows to protect her leg and contact me if she develops a wound.  We discussed not scheduling follow-up but to contact me if there are new issues.    Charlena Cross, MD, FACS [facility] [phone] Pager [phone]

## 2023-07-14 ENCOUNTER — Other Ambulatory Visit: Payer: Self-pay | Admitting: Physician Assistant

## 2023-07-15 DIAGNOSIS — J988 Other specified respiratory disorders: Secondary | ICD-10-CM | POA: Diagnosis not present

## 2023-07-18 ENCOUNTER — Other Ambulatory Visit: Payer: Self-pay | Admitting: Physician Assistant

## 2023-07-21 DIAGNOSIS — R062 Wheezing: Secondary | ICD-10-CM | POA: Diagnosis not present

## 2023-07-21 DIAGNOSIS — J988 Other specified respiratory disorders: Secondary | ICD-10-CM | POA: Diagnosis not present

## 2023-08-19 ENCOUNTER — Emergency Department (HOSPITAL_COMMUNITY): Payer: Medicare Other

## 2023-08-19 ENCOUNTER — Inpatient Hospital Stay (HOSPITAL_COMMUNITY)
Admission: EM | Admit: 2023-08-19 | Discharge: 2023-08-23 | DRG: 534 | Disposition: A | Discharge status: Skilled Nursing Facility | Payer: Medicare Other | Attending: Student | Admitting: Student

## 2023-08-19 DIAGNOSIS — M797 Fibromyalgia: Secondary | ICD-10-CM | POA: Diagnosis present

## 2023-08-19 DIAGNOSIS — S72002A Fracture of unspecified part of neck of left femur, initial encounter for closed fracture: Secondary | ICD-10-CM | POA: Diagnosis not present

## 2023-08-19 DIAGNOSIS — S0990XA Unspecified injury of head, initial encounter: Secondary | ICD-10-CM | POA: Diagnosis not present

## 2023-08-19 DIAGNOSIS — Y92009 Unspecified place in unspecified non-institutional (private) residence as the place of occurrence of the external cause: Secondary | ICD-10-CM

## 2023-08-19 DIAGNOSIS — T402X5A Adverse effect of other opioids, initial encounter: Secondary | ICD-10-CM | POA: Diagnosis not present

## 2023-08-19 DIAGNOSIS — M159 Polyosteoarthritis, unspecified: Secondary | ICD-10-CM | POA: Diagnosis present

## 2023-08-19 DIAGNOSIS — D649 Anemia, unspecified: Secondary | ICD-10-CM | POA: Diagnosis present

## 2023-08-19 DIAGNOSIS — I70203 Unspecified atherosclerosis of native arteries of extremities, bilateral legs: Secondary | ICD-10-CM | POA: Diagnosis present

## 2023-08-19 DIAGNOSIS — S8992XA Unspecified injury of left lower leg, initial encounter: Secondary | ICD-10-CM | POA: Diagnosis not present

## 2023-08-19 DIAGNOSIS — S0083XA Contusion of other part of head, initial encounter: Secondary | ICD-10-CM | POA: Diagnosis not present

## 2023-08-19 DIAGNOSIS — Z8673 Personal history of transient ischemic attack (TIA), and cerebral infarction without residual deficits: Secondary | ICD-10-CM

## 2023-08-19 DIAGNOSIS — E039 Hypothyroidism, unspecified: Secondary | ICD-10-CM | POA: Diagnosis present

## 2023-08-19 DIAGNOSIS — Z833 Family history of diabetes mellitus: Secondary | ICD-10-CM

## 2023-08-19 DIAGNOSIS — S728X2A Other fracture of left femur, initial encounter for closed fracture: Secondary | ICD-10-CM | POA: Diagnosis not present

## 2023-08-19 DIAGNOSIS — I480 Paroxysmal atrial fibrillation: Secondary | ICD-10-CM | POA: Diagnosis present

## 2023-08-19 DIAGNOSIS — S0181XA Laceration without foreign body of other part of head, initial encounter: Secondary | ICD-10-CM | POA: Diagnosis present

## 2023-08-19 DIAGNOSIS — Y712 Prosthetic and other implants, materials and accessory cardiovascular devices associated with adverse incidents: Secondary | ICD-10-CM | POA: Diagnosis present

## 2023-08-19 DIAGNOSIS — N1832 Chronic kidney disease, stage 3b: Secondary | ICD-10-CM | POA: Diagnosis not present

## 2023-08-19 DIAGNOSIS — R54 Age-related physical debility: Secondary | ICD-10-CM | POA: Diagnosis present

## 2023-08-19 DIAGNOSIS — Z882 Allergy status to sulfonamides status: Secondary | ICD-10-CM

## 2023-08-19 DIAGNOSIS — S72452A Displaced supracondylar fracture without intracondylar extension of lower end of left femur, initial encounter for closed fracture: Secondary | ICD-10-CM | POA: Diagnosis not present

## 2023-08-19 DIAGNOSIS — E7800 Pure hypercholesterolemia, unspecified: Secondary | ICD-10-CM | POA: Diagnosis present

## 2023-08-19 DIAGNOSIS — Z881 Allergy status to other antibiotic agents status: Secondary | ICD-10-CM

## 2023-08-19 DIAGNOSIS — Z79899 Other long term (current) drug therapy: Secondary | ICD-10-CM | POA: Diagnosis not present

## 2023-08-19 DIAGNOSIS — S0993XA Unspecified injury of face, initial encounter: Secondary | ICD-10-CM | POA: Diagnosis not present

## 2023-08-19 DIAGNOSIS — Z7401 Bed confinement status: Secondary | ICD-10-CM | POA: Diagnosis not present

## 2023-08-19 DIAGNOSIS — Z8719 Personal history of other diseases of the digestive system: Secondary | ICD-10-CM

## 2023-08-19 DIAGNOSIS — M9712XA Periprosthetic fracture around internal prosthetic left knee joint, initial encounter: Secondary | ICD-10-CM | POA: Diagnosis present

## 2023-08-19 DIAGNOSIS — Z7189 Other specified counseling: Secondary | ICD-10-CM | POA: Diagnosis not present

## 2023-08-19 DIAGNOSIS — T82856A Stenosis of peripheral vascular stent, initial encounter: Secondary | ICD-10-CM | POA: Diagnosis present

## 2023-08-19 DIAGNOSIS — Z89511 Acquired absence of right leg below knee: Secondary | ICD-10-CM | POA: Diagnosis not present

## 2023-08-19 DIAGNOSIS — R220 Localized swelling, mass and lump, head: Secondary | ICD-10-CM | POA: Diagnosis not present

## 2023-08-19 DIAGNOSIS — M79662 Pain in left lower leg: Secondary | ICD-10-CM | POA: Diagnosis not present

## 2023-08-19 DIAGNOSIS — Q667 Congenital pes cavus, unspecified foot: Secondary | ICD-10-CM | POA: Diagnosis not present

## 2023-08-19 DIAGNOSIS — Z8249 Family history of ischemic heart disease and other diseases of the circulatory system: Secondary | ICD-10-CM

## 2023-08-19 DIAGNOSIS — N390 Urinary tract infection, site not specified: Secondary | ICD-10-CM | POA: Diagnosis present

## 2023-08-19 DIAGNOSIS — H548 Legal blindness, as defined in USA: Secondary | ICD-10-CM | POA: Diagnosis present

## 2023-08-19 DIAGNOSIS — Z79890 Hormone replacement therapy (postmenopausal): Secondary | ICD-10-CM

## 2023-08-19 DIAGNOSIS — Z888 Allergy status to other drugs, medicaments and biological substances status: Secondary | ICD-10-CM

## 2023-08-19 DIAGNOSIS — B961 Klebsiella pneumoniae [K. pneumoniae] as the cause of diseases classified elsewhere: Secondary | ICD-10-CM | POA: Diagnosis present

## 2023-08-19 DIAGNOSIS — F411 Generalized anxiety disorder: Secondary | ICD-10-CM | POA: Diagnosis present

## 2023-08-19 DIAGNOSIS — I5032 Chronic diastolic (congestive) heart failure: Secondary | ICD-10-CM | POA: Diagnosis present

## 2023-08-19 DIAGNOSIS — Y92002 Bathroom of unspecified non-institutional (private) residence single-family (private) house as the place of occurrence of the external cause: Secondary | ICD-10-CM

## 2023-08-19 DIAGNOSIS — W19XXXA Unspecified fall, initial encounter: Secondary | ICD-10-CM

## 2023-08-19 DIAGNOSIS — Z515 Encounter for palliative care: Secondary | ICD-10-CM | POA: Diagnosis not present

## 2023-08-19 DIAGNOSIS — M810 Age-related osteoporosis without current pathological fracture: Secondary | ICD-10-CM | POA: Diagnosis present

## 2023-08-19 DIAGNOSIS — G8929 Other chronic pain: Secondary | ICD-10-CM | POA: Diagnosis present

## 2023-08-19 DIAGNOSIS — Z043 Encounter for examination and observation following other accident: Secondary | ICD-10-CM | POA: Diagnosis not present

## 2023-08-19 DIAGNOSIS — I739 Peripheral vascular disease, unspecified: Secondary | ICD-10-CM | POA: Diagnosis present

## 2023-08-19 DIAGNOSIS — I130 Hypertensive heart and chronic kidney disease with heart failure and stage 1 through stage 4 chronic kidney disease, or unspecified chronic kidney disease: Secondary | ICD-10-CM | POA: Diagnosis present

## 2023-08-19 DIAGNOSIS — R9431 Abnormal electrocardiogram [ECG] [EKG]: Secondary | ICD-10-CM | POA: Diagnosis not present

## 2023-08-19 DIAGNOSIS — Z96611 Presence of right artificial shoulder joint: Secondary | ICD-10-CM | POA: Diagnosis not present

## 2023-08-19 DIAGNOSIS — D631 Anemia in chronic kidney disease: Secondary | ICD-10-CM | POA: Diagnosis present

## 2023-08-19 DIAGNOSIS — S199XXA Unspecified injury of neck, initial encounter: Secondary | ICD-10-CM | POA: Diagnosis not present

## 2023-08-19 DIAGNOSIS — Z83438 Family history of other disorder of lipoprotein metabolism and other lipidemia: Secondary | ICD-10-CM

## 2023-08-19 DIAGNOSIS — W050XXA Fall from non-moving wheelchair, initial encounter: Secondary | ICD-10-CM | POA: Diagnosis present

## 2023-08-19 DIAGNOSIS — Z86718 Personal history of other venous thrombosis and embolism: Secondary | ICD-10-CM

## 2023-08-19 DIAGNOSIS — Z66 Do not resuscitate: Secondary | ICD-10-CM | POA: Diagnosis present

## 2023-08-19 DIAGNOSIS — Z87891 Personal history of nicotine dependence: Secondary | ICD-10-CM

## 2023-08-19 DIAGNOSIS — E871 Hypo-osmolality and hyponatremia: Secondary | ICD-10-CM | POA: Diagnosis present

## 2023-08-19 DIAGNOSIS — Z7902 Long term (current) use of antithrombotics/antiplatelets: Secondary | ICD-10-CM

## 2023-08-19 DIAGNOSIS — H353230 Exudative age-related macular degeneration, bilateral, stage unspecified: Secondary | ICD-10-CM | POA: Diagnosis present

## 2023-08-19 DIAGNOSIS — S72402A Unspecified fracture of lower end of left femur, initial encounter for closed fracture: Secondary | ICD-10-CM | POA: Diagnosis present

## 2023-08-19 DIAGNOSIS — Q66222 Congenital metatarsus adductus, left foot: Secondary | ICD-10-CM | POA: Diagnosis not present

## 2023-08-19 DIAGNOSIS — Z96652 Presence of left artificial knee joint: Secondary | ICD-10-CM | POA: Diagnosis not present

## 2023-08-19 DIAGNOSIS — M1612 Unilateral primary osteoarthritis, left hip: Secondary | ICD-10-CM | POA: Diagnosis not present

## 2023-08-19 DIAGNOSIS — M47812 Spondylosis without myelopathy or radiculopathy, cervical region: Secondary | ICD-10-CM | POA: Diagnosis not present

## 2023-08-19 DIAGNOSIS — S7292XA Unspecified fracture of left femur, initial encounter for closed fracture: Secondary | ICD-10-CM | POA: Diagnosis not present

## 2023-08-19 DIAGNOSIS — Z880 Allergy status to penicillin: Secondary | ICD-10-CM

## 2023-08-19 DIAGNOSIS — M40204 Unspecified kyphosis, thoracic region: Secondary | ICD-10-CM | POA: Diagnosis not present

## 2023-08-19 DIAGNOSIS — N179 Acute kidney failure, unspecified: Secondary | ICD-10-CM | POA: Diagnosis not present

## 2023-08-19 DIAGNOSIS — K5900 Constipation, unspecified: Secondary | ICD-10-CM | POA: Diagnosis not present

## 2023-08-19 DIAGNOSIS — S01112A Laceration without foreign body of left eyelid and periocular area, initial encounter: Secondary | ICD-10-CM | POA: Diagnosis not present

## 2023-08-19 DIAGNOSIS — M47811 Spondylosis without myelopathy or radiculopathy, occipito-atlanto-axial region: Secondary | ICD-10-CM | POA: Diagnosis not present

## 2023-08-19 DIAGNOSIS — Z471 Aftercare following joint replacement surgery: Secondary | ICD-10-CM | POA: Diagnosis not present

## 2023-08-19 DIAGNOSIS — M25562 Pain in left knee: Secondary | ICD-10-CM | POA: Diagnosis not present

## 2023-08-19 DIAGNOSIS — R58 Hemorrhage, not elsewhere classified: Secondary | ICD-10-CM | POA: Diagnosis not present

## 2023-08-19 DIAGNOSIS — I10 Essential (primary) hypertension: Secondary | ICD-10-CM | POA: Diagnosis present

## 2023-08-19 DIAGNOSIS — Z85828 Personal history of other malignant neoplasm of skin: Secondary | ICD-10-CM

## 2023-08-19 DIAGNOSIS — S79929A Unspecified injury of unspecified thigh, initial encounter: Secondary | ICD-10-CM | POA: Diagnosis not present

## 2023-08-19 DIAGNOSIS — M1711 Unilateral primary osteoarthritis, right knee: Secondary | ICD-10-CM | POA: Diagnosis not present

## 2023-08-19 DIAGNOSIS — I708 Atherosclerosis of other arteries: Secondary | ICD-10-CM | POA: Diagnosis present

## 2023-08-19 DIAGNOSIS — I700 Atherosclerosis of aorta: Secondary | ICD-10-CM | POA: Diagnosis not present

## 2023-08-19 DIAGNOSIS — R519 Headache, unspecified: Secondary | ICD-10-CM | POA: Diagnosis not present

## 2023-08-19 DIAGNOSIS — Z90710 Acquired absence of both cervix and uterus: Secondary | ICD-10-CM

## 2023-08-19 DIAGNOSIS — M79605 Pain in left leg: Secondary | ICD-10-CM | POA: Diagnosis not present

## 2023-08-19 DIAGNOSIS — N183 Chronic kidney disease, stage 3 unspecified: Secondary | ICD-10-CM | POA: Diagnosis present

## 2023-08-19 DIAGNOSIS — K5903 Drug induced constipation: Secondary | ICD-10-CM | POA: Diagnosis not present

## 2023-08-19 DIAGNOSIS — Z993 Dependence on wheelchair: Secondary | ICD-10-CM

## 2023-08-19 DIAGNOSIS — M79652 Pain in left thigh: Secondary | ICD-10-CM | POA: Diagnosis not present

## 2023-08-19 DIAGNOSIS — Z885 Allergy status to narcotic agent status: Secondary | ICD-10-CM

## 2023-08-19 LAB — BASIC METABOLIC PANEL
Anion gap: 9 (ref 5–15)
BUN: 27 mg/dL — ABNORMAL HIGH (ref 8–23)
CO2: 24 mmol/L (ref 22–32)
Calcium: 8.9 mg/dL (ref 8.9–10.3)
Chloride: 102 mmol/L (ref 98–111)
Creatinine, Ser: 1.24 mg/dL — ABNORMAL HIGH (ref 0.44–1.00)
GFR, Estimated: 41 mL/min — ABNORMAL LOW (ref >60)
Glucose, Bld: 127 mg/dL — ABNORMAL HIGH (ref 70–99)
Potassium: 4.6 mmol/L (ref 3.5–5.1)
Sodium: 135 mmol/L (ref 135–145)

## 2023-08-19 LAB — CBC WITH DIFFERENTIAL/PLATELET
Abs Immature Granulocytes: 0.1 10*3/uL — ABNORMAL HIGH (ref 0.00–0.07)
Basophils Absolute: 0.1 10*3/uL (ref 0.0–0.1)
Basophils Relative: 1 %
Eosinophils Absolute: 0.6 10*3/uL — ABNORMAL HIGH (ref 0.0–0.5)
Eosinophils Relative: 7 %
HCT: 36.5 % (ref 36.0–46.0)
Hemoglobin: 11.7 g/dL — ABNORMAL LOW (ref 12.0–15.0)
Immature Granulocytes: 1 %
Lymphocytes Relative: 37 %
Lymphs Abs: 3.3 10*3/uL (ref 0.7–4.0)
MCH: 31 pg (ref 26.0–34.0)
MCHC: 32.1 g/dL (ref 30.0–36.0)
MCV: 96.6 fL (ref 80.0–100.0)
Monocytes Absolute: 0.9 10*3/uL (ref 0.1–1.0)
Monocytes Relative: 11 %
Neutro Abs: 3.9 10*3/uL (ref 1.7–7.7)
Neutrophils Relative %: 43 %
Platelets: 209 10*3/uL (ref 150–400)
RBC: 3.78 MIL/uL — ABNORMAL LOW (ref 3.87–5.11)
RDW: 13.8 % (ref 11.5–15.5)
WBC: 8.9 10*3/uL (ref 4.0–10.5)
nRBC: 0 % (ref 0.0–0.2)

## 2023-08-19 LAB — URINALYSIS, ROUTINE W REFLEX MICROSCOPIC
Bilirubin Urine: NEGATIVE
Glucose, UA: NEGATIVE mg/dL
Hgb urine dipstick: NEGATIVE
Ketones, ur: NEGATIVE mg/dL
Nitrite: POSITIVE — AB
Protein, ur: NEGATIVE mg/dL
Specific Gravity, Urine: 1.009 (ref 1.005–1.030)
WBC, UA: >50 WBC/hpf (ref 0–5)
pH: 5 (ref 5.0–8.0)

## 2023-08-19 MED ORDER — DIAZEPAM 5 MG PO TABS
2.5000 mg | ORAL_TABLET | Freq: Every evening | ORAL | Status: DC | PRN
  Administered 2023-08-23: 2.5 mg via ORAL
  Filled 2023-08-19 (×2): qty 1

## 2023-08-19 MED ORDER — FENTANYL CITRATE PF 50 MCG/ML IJ SOSY
50.0000 ug | PREFILLED_SYRINGE | Freq: Once | INTRAMUSCULAR | Status: AC
  Administered 2023-08-19: 50 ug via INTRAVENOUS
  Filled 2023-08-19: qty 1

## 2023-08-19 MED ORDER — ISOSORBIDE MONONITRATE ER 30 MG PO TB24
30.0000 mg | ORAL_TABLET | Freq: Every evening | ORAL | Status: DC
  Administered 2023-08-19: 30 mg via ORAL
  Filled 2023-08-19: qty 1

## 2023-08-19 MED ORDER — SODIUM CHLORIDE 0.9% FLUSH
3.0000 mL | Freq: Two times a day (BID) | INTRAVENOUS | Status: DC
  Administered 2023-08-19 – 2023-08-21 (×4): 3 mL via INTRAVENOUS

## 2023-08-19 MED ORDER — OXYCODONE HCL 5 MG PO TABS
5.0000 mg | ORAL_TABLET | Freq: Four times a day (QID) | ORAL | Status: DC | PRN
  Administered 2023-08-19 – 2023-08-23 (×10): 5 mg via ORAL
  Filled 2023-08-19 (×11): qty 1

## 2023-08-19 MED ORDER — MORPHINE SULFATE (PF) 2 MG/ML IV SOLN: 1.0000 mg | INTRAVENOUS | Status: DC | PRN

## 2023-08-19 MED ORDER — GABAPENTIN 300 MG PO CAPS
300.0000 mg | ORAL_CAPSULE | Freq: Three times a day (TID) | ORAL | Status: DC
  Administered 2023-08-19 – 2023-08-20 (×2): 300 mg via ORAL
  Filled 2023-08-19 (×2): qty 1

## 2023-08-19 MED ORDER — POLYETHYLENE GLYCOL 3350 17 G PO PACK
17.0000 g | PACK | Freq: Every day | ORAL | Status: DC
  Administered 2023-08-20: 17 g via ORAL
  Filled 2023-08-19 (×3): qty 1

## 2023-08-19 MED ORDER — ONDANSETRON HCL 4 MG/2ML IJ SOLN
4.0000 mg | Freq: Four times a day (QID) | INTRAMUSCULAR | Status: DC | PRN
  Administered 2023-08-19: 4 mg via INTRAVENOUS
  Filled 2023-08-19: qty 2

## 2023-08-19 MED ORDER — SODIUM CHLORIDE 0.9% FLUSH: 3.0000 mL | INTRAVENOUS | Status: DC | PRN

## 2023-08-19 MED ORDER — HYDROMORPHONE HCL 1 MG/ML IJ SOLN
0.5000 mg | Freq: Once | INTRAMUSCULAR | Status: AC
  Administered 2023-08-19: 0.5 mg via INTRAVENOUS
  Filled 2023-08-19: qty 1

## 2023-08-19 MED ORDER — GABAPENTIN 300 MG PO CAPS: 300.0000 mg | ORAL_CAPSULE | Freq: Every evening | ORAL | Status: DC | PRN

## 2023-08-19 MED ORDER — LEVOTHYROXINE SODIUM 50 MCG PO TABS
50.0000 ug | ORAL_TABLET | Freq: Every day | ORAL | Status: DC
  Administered 2023-08-20 – 2023-08-23 (×4): 50 ug via ORAL
  Filled 2023-08-19 (×4): qty 1

## 2023-08-19 MED ORDER — HYDROMORPHONE HCL 1 MG/ML IJ SOLN
0.5000 mg | INTRAMUSCULAR | Status: DC | PRN
  Administered 2023-08-19 – 2023-08-22 (×7): 0.5 mg via INTRAVENOUS
  Filled 2023-08-19 (×8): qty 0.5

## 2023-08-19 MED ORDER — ENOXAPARIN SODIUM 30 MG/0.3ML IJ SOSY
30.0000 mg | PREFILLED_SYRINGE | INTRAMUSCULAR | Status: DC
  Administered 2023-08-20 – 2023-08-23 (×4): 30 mg via SUBCUTANEOUS
  Filled 2023-08-19 (×4): qty 0.3

## 2023-08-19 MED ORDER — METOPROLOL TARTRATE 25 MG PO TABS
25.0000 mg | ORAL_TABLET | Freq: Two times a day (BID) | ORAL | Status: DC
  Administered 2023-08-19: 25 mg via ORAL
  Filled 2023-08-19 (×2): qty 1

## 2023-08-19 MED ORDER — SODIUM CHLORIDE 0.9 % IV SOLN: 250.0000 mL | INTRAVENOUS | Status: DC | PRN

## 2023-08-19 MED ORDER — SODIUM CHLORIDE 0.9 % IV SOLN
1.0000 g | Freq: Once | INTRAVENOUS | Status: DC
  Administered 2023-08-19: 1 g via INTRAVENOUS
  Filled 2023-08-19: qty 10

## 2023-08-19 NOTE — ED Notes (Signed)
ED TO INPATIENT HANDOFF REPORT  ED Nurse Name and Phone #: [name] Name/Age/Gender [name] 89 y.o. female Room/Bed: 015C/015C  Code Status   Code Status: Prior  Home/SNF/Other Home Patient oriented to: self, place, time, and situation Is this baseline? Yes   Triage Complete: Triage complete  Chief Complaint Femur fracture, left [facility]) [S72.92XA]  Triage Note Pt coming in from home . She was transferring with a board fell off the side and hit head on brick floor. Pt has hx of DVTs pain in her left leg. No loc. On plavix.EMS gave 25 mcg of fentinyl  20 Lac  184/70  Gcs 15 Hr 66  98% on ra   Allergies Allergies  Allergen Reactions   Motrin [Ibuprofen] Other (See Comments)    ADVERSE REACTION - GI BLEED   Statins Other (See Comments)    ADVERSE REACTION MUSCLE PAIN & WEAKNESS   Amitriptyline Other (See Comments)    Unknown reaction per daughter, unable to tolerate   Augmentin [Amoxicillin-Pot Clavulanate] Diarrhea   Doxycycline Diarrhea   Eliquis [Apixaban] Other (See Comments)    Lower GI bleeding   Ezetimibe Other (See Comments)   Lisinopril Cough   Lisinopril-Hydrochlorothiazide     Other reaction(s): felt "bad" on   Niacin And Related Other (See Comments)   Nitrofurantoin Other (See Comments)   Paxil [Paroxetine] Other (See Comments)   Morphine And Codeine Other (See Comments)    HALLUCINATIONS REACTION IS SIDE EFFECT   Promethazine Hcl Other (See Comments)    IV causes confusion, oral better   Sulfa Antibiotics Nausea And Vomiting    Level of Care/Admitting Diagnosis ED Disposition     ED Disposition  Admit   Condition  --   Comment  Hospital Area: [facility] [[id number]]  Level of Care: Telemetry Medical [104]  May admit patient to [facility] or [facility] if equivalent level of care is available:: No  Covid Evaluation: Asymptomatic - no recent exposure (last 10 days) testing not required  Diagnosis: Femur fracture,  left [facility]) [[id number]]  Admitting Physician: Erenest Blank  Attending Physician: Erenest Blank  Certification:: I certify this patient will need inpatient services for at least 2 midnights  Expected Medical Readiness: 08/24/2023          B Medical/Surgery History Past Medical History:  Diagnosis Date   Anemia    Anginal pain (HCC)    Arthritis    "qwhere" (09/05/2016)   Atrial fibrillation (HCC) 09/2016   Chronic lower back pain    Complication of anesthesia    "takes a long time for it to wear off; I can hallucinate if I take too much" (09/05/2016)   DVT (deep venous thrombosis) (HCC) 10/2009   Fall from steps 08/31/2013   Fx. pelvis, Left Hip, Left Elbow   Fibromyalgia    GERD (gastroesophageal reflux disease)    09/22/16- "no too much anymore"   GI bleed 10/24/2016   High cholesterol    History of blood transfusion    History of hiatal hernia    Hypertension    Macular degeneration, wet (HCC)    "started in right eye; now legally blind in that eye; now started in left eye but pretty much in control" (09/05/2016)   Osteoporosis    Peripheral vascular disease (HCC)    nonviable tissue Right foot   PONV (postoperative nausea and vomiting)    Squamous cell carcinoma of skin of right calf Aug. 2015  Stroke (HCC)    TIA's no residual   TIA (transient ischemic attack)    "several at once; none in a long time" (09/05/2016)   Past Surgical History:  Procedure Laterality Date   ABDOMINAL AORTAGRAM N/A 12/26/2014   Procedure: ABDOMINAL AORTAGRAM;  Surgeon: Nada Libman, MD;  Location: [facility] CATH LAB;  Service: Cardiovascular;  Laterality: N/A;   AMPUTATION Right 12/23/2016   Procedure: AMPUTATION BELOW KNEE;  Surgeon: Nada Libman, MD;  Location: [facility] OR;  Service: Vascular;  Laterality: Right;   AORTOGRAM N/A 09/26/2016   Procedure: AORTOGRAM;  Surgeon: Maeola Harman, MD;  Location: [facility] OR;  Service: Vascular;  Laterality: N/A;   CARPAL  TUNNEL RELEASE Right    CATARACT EXTRACTION W/ INTRAOCULAR LENS  IMPLANT, BILATERAL Bilateral    COLONOSCOPY     DILATION AND CURETTAGE OF UTERUS     DRESSING CHANGE UNDER ANESTHESIA Right 01/16/2017   Procedure: DRESSING CHANGE RIGHT BELOW KNEE AMPUTATION;  Surgeon: Nada Libman, MD;  Location: [facility] OR;  Service: Vascular;  Laterality: Right;   EYE SURGERY Right    "macular OR"   FEMORAL ARTERY STENT  12-11-10   Left SFA   FEMORAL-POPLITEAL BYPASS GRAFT Left 09/24/2016   Procedure: REDO FEMORAL TO POPLITEAL ARTERY BYPASS GRAFT USING PROPATEN RINGED GORTEX GRAFT;  Surgeon: Nada Libman, MD;  Location: [facility] OR;  Service: Vascular;  Laterality: Left;   FEMORAL-TIBIAL BYPASS GRAFT Left 01/16/2017   Procedure: REDO BYPASS GRAFT FEMORAL-TIBIAL ARTERY WITH GORTEX GRAFT;  Surgeon: Nada Libman, MD;  Location: [facility] OR;  Service: Vascular;  Laterality: Left;  AND LOWER LEG    I & D EXTREMITY Right 12/17/2016   Procedure: IRRIGATION AND DEBRIDEMENT RIGHT FOOT;  Surgeon: Nada Libman, MD;  Location: [facility] OR;  Service: Vascular;  Laterality: Right;   INCISION AND DRAINAGE OF WOUND Left 10/25/2009   leg/notes 11/13/2009   INSERTION OF ILIAC STENT Left 12/26/2014   Procedure: INSERTION OF ILIAC STENT;  Surgeon: Nada Libman, MD;  Location: [facility] CATH LAB;  Service: Cardiovascular;  Laterality: Left;   INSERTION OF ILIAC STENT Left 09/26/2016   Procedure: SUB INTIMAL INSERTION OF SUPERFICIAL FEMORAL ARTERY AND BELOW KNEE BYPASS GRAFT;  Surgeon: Maeola Harman, MD;  Location: [facility] OR;  Service: Vascular;  Laterality: Left;   INSERTION OF ILIAC STENT Left 05/04/2020   Procedure: INSERTION OF SUPERFICIAL FEMORAL ARTERY BYPASS GRAFT STENT;  Surgeon: Nada Libman, MD;  Location: [facility] OR;  Service: Vascular;  Laterality: Left;   JOINT REPLACEMENT     knee   JOINT REPLACEMENT Left Oct. 17, 2014   Elbow ( pt fell 08-31-13 )   LOWER EXTREMITY ANGIOGRAM Left 12/27/2019   Procedure: Intraoperative Left  Lower Extremity Angiogram;  Surgeon: Chuck Hint, MD;  Location: [facility] OR;  Service: Vascular;  Laterality: Left;   LOWER EXTREMITY ANGIOGRAPHY N/A 09/21/2018   Procedure: LOWER EXTREMITY ANGIOGRAPHY;  Surgeon: Nada Libman, MD;  [facility] INVASIVE CV LAB;  Service: Cardiovascular;  Laterality: N/A;   ORIF SHOULDER FRACTURE Right    "it was crushed"   PATCH ANGIOPLASTY Left 05/04/2020   Procedure: BALLOON  ANGIOPLASTY;  Surgeon: Nada Libman, MD;  Location: [facility] OR;  Service: Vascular;  Laterality: Left;   PERIPHERAL VASCULAR CATHETERIZATION N/A 10/30/2015   Procedure: Abdominal Aortogram w/Lower Extremity;  Surgeon: Nada Libman, MD;  [facility] INVASIVE CV LAB;  Service: Cardiovascular;  Laterality: N/A;   PERIPHERAL VASCULAR CATHETERIZATION  10/30/2015  Procedure: Peripheral Vascular Intervention;  Surgeon: Nada Libman, MD;  Location: [facility] INVASIVE CV LAB;  Service: Cardiovascular;;   PERIPHERAL VASCULAR CATHETERIZATION N/A 04/01/2016   Procedure: Abdominal Aortogram w/Lower Extremity;  Surgeon: Nada Libman, MD;  [facility]: [facility] INVASIVE CV LAB;  Service: Cardiovascular;  Laterality: N/A;   PERIPHERAL VASCULAR CATHETERIZATION Left 04/01/2016   Procedure: Peripheral Vascular Atherectomy;  Surgeon: Nada Libman, MD;  [facility]: [facility] INVASIVE CV LAB;  Service: Cardiovascular;  Laterality: Left;  Superficial femoral artery.   PERIPHERAL VASCULAR CATHETERIZATION Right 09/05/2016   "stent"   PERIPHERAL VASCULAR CATHETERIZATION N/A 09/05/2016   Procedure: Abdominal Aortogram w/Lower Extremity;  Surgeon: Nada Libman, MD;  Location: [facility] INVASIVE CV LAB;  Service: Cardiovascular;  Laterality: N/A;   PERIPHERAL VASCULAR CATHETERIZATION Right 09/05/2016   Procedure: Peripheral Vascular Intervention;  Surgeon: Nada Libman, MD;  Location: [facility] INVASIVE CV LAB;  Service: Cardiovascular;  Laterality: Right;  Superficial Femoral   PERIPHERAL VASCULAR CATHETERIZATION Left 09/09/2016    Procedure: Lower Extremity Angiography;  Surgeon: Nada Libman, MD;  Location: [facility] INVASIVE CV LAB;  Service: Cardiovascular;  Laterality: Left;   PR VEIN BYPASS GRAFT,AORTO-FEM-POP  09-13-09   Left Fem-pop   THROMBECTOMY FEMORAL ARTERY Right 09/26/2016   Procedure: Thromboembolectomy Right Lower Extremity, Right Femoral Artery Endarterectomy with Patch Angioplasty; Right Lower Extremity Angiogram ;  Surgeon: Maeola Harman, MD;  Location: [facility] OR;  Service: Vascular;  Laterality: Right;   THROMBECTOMY FEMORAL ARTERY Left 12/27/2019   Procedure: Redo left femoral artery exposure, Thrombectomy of left deep femoral artery to anterior tibial artery bypass;  Surgeon: Chuck Hint, MD;  Location: [facility] OR;  Service: Vascular;  Laterality: Left;   THROMBECTOMY FEMORAL ARTERY Left 05/04/2020   Procedure: THROMBECTOMY LEFT LEG;  Surgeon: Nada Libman, MD;  Location: [facility] OR;  Service: Vascular;  Laterality: Left;   TOTAL ELBOW ARTHROPLASTY Left 09/03/2013   Procedure: LEFT TOTAL ELBOW ARTHROPLASTY;  Surgeon: Dominica Severin, MD;  Location: [facility] OR;  Service: Orthopedics;  Laterality: Left;   TOTAL KNEE ARTHROPLASTY Left 06/2006   TRANSMETATARSAL AMPUTATION Right 12/11/2016   Procedure: TRANSMETATARSAL AMPUTATION;  Surgeon: Nada Libman, MD;  Location: [facility] OR;  Service: Vascular;  Laterality: Right;   TUBAL LIGATION     VAGINAL HYSTERECTOMY       A IV Location/Drains/Wounds Patient Lines/Drains/Airways Status     Active Line/Drains/Airways     Name Placement date Placement time Site Days   Peripheral IV 08/19/23 20 G Left Antecubital 08/19/23  1148  Antecubital  less than 1   Closed System Drain 1 Left Groin Bulb (JP) 15 Fr. 12/28/19  0221  Groin  1330   Pressure Injury 05/09/20 Buttocks Lower;Mid Stage 1 -  Intact skin with non-blanchable redness of a localized area usually over a bony prominence. 05/09/20  0400  -- 1197            Intake/Output Last 24 hours No intake or  output data in the 24 hours ending 08/19/23 1539  Labs/Imaging Results for orders placed or performed during the hospital encounter of 08/19/23 (from the past 48 hour(s))  CBC with Differential     Status: Abnormal   Collection Time: 08/19/23 11:37 AM  Result Value Ref Range   WBC 8.9 4.0 - 10.5 K/uL   RBC 3.78 (L) 3.87 - 5.11 MIL/uL   Hemoglobin 11.7 (L) 12.0 - 15.0 g/dL   HCT 81.1 91.4 - 78.2 %   MCV 96.6 80.0 - 100.0 fL  MCH 31.0 26.0 - 34.0 pg   MCHC 32.1 30.0 - 36.0 g/dL   RDW 16.1 09.6 - 04.5 %   Platelets 209 150 - 400 K/uL   nRBC 0.0 0.0 - 0.2 %   Neutrophils Relative % 43 %   Neutro Abs 3.9 1.7 - 7.7 K/uL   Lymphocytes Relative 37 %   Lymphs Abs 3.3 0.7 - 4.0 K/uL   Monocytes Relative 11 %   Monocytes Absolute 0.9 0.1 - 1.0 K/uL   Eosinophils Relative 7 %   Eosinophils Absolute 0.6 (H) 0.0 - 0.5 K/uL   Basophils Relative 1 %   Basophils Absolute 0.1 0.0 - 0.1 K/uL   Immature Granulocytes 1 %   Abs Immature Granulocytes 0.10 (H) 0.00 - 0.07 K/uL    Comment: Performed at [facility], [address] [place], [place] [address]  Urinalysis, Routine w reflex microscopic -Urine, Catheterized     Status: Abnormal   Collection Time: 08/19/23  1:13 PM  Result Value Ref Range   Color, Urine YELLOW YELLOW   APPearance CLOUDY (A) CLEAR   Specific Gravity, Urine 1.009 1.005 - 1.030   pH 5.0 5.0 - 8.0   Glucose, UA NEGATIVE NEGATIVE mg/dL   Hgb urine dipstick NEGATIVE NEGATIVE   Bilirubin Urine NEGATIVE NEGATIVE   Ketones, ur NEGATIVE NEGATIVE mg/dL   Protein, ur NEGATIVE NEGATIVE mg/dL   Nitrite POSITIVE (A) NEGATIVE   Leukocytes,Ua LARGE (A) NEGATIVE   RBC / HPF 6-10 0 - 5 RBC/hpf   WBC, UA >50 0 - 5 WBC/hpf   Bacteria, UA MANY (A) NONE SEEN   Squamous Epithelial / HPF 0-5 0 - 5 /HPF   WBC Clumps PRESENT    Hyaline Casts, UA PRESENT     Comment: Performed at [facility], [address] [place], [place] [address]  Basic metabolic panel     Status:  Abnormal   Collection Time: 08/19/23  1:44 PM  Result Value Ref Range   Sodium 135 135 - 145 mmol/L   Potassium 4.6 3.5 - 5.1 mmol/L   Chloride 102 98 - 111 mmol/L   CO2 24 22 - 32 mmol/L   Glucose, Bld 127 (H) 70 - 99 mg/dL    Comment: Glucose reference range applies only to samples taken after fasting for at least 8 hours.   BUN 27 (H) 8 - 23 mg/dL   Creatinine, Ser 8.29 (H) 0.44 - 1.00 mg/dL   Calcium 8.9 8.9 - 56.2 mg/dL   GFR, Estimated 41 (L) >60 mL/min    Comment: (NOTE) Calculated using the CKD-EPI Creatinine Equation (2021)    Anion gap 9 5 - 15    Comment: Performed at [facility] Lab, [address] [place], [place] [address]   DG Foot 2 Views Left  Result Date: 08/19/2023 CLINICAL DATA:  Fall.  Left leg pain.  History of foot drop. EXAM: LEFT FOOT - 2 VIEW COMPARISON:  Left foot radiographs 12/23/2021 FINDINGS: There is diffuse decreased bone mineralization. There is again mild high-grade pes cavus with mild metatarsus adductus and medial subluxation of the navicular with respect to the talus. There is again mild high-grade subchondral sclerosis within the distal talus. There is again high-grade extensor positioning of the great toe metatarsophalangeal joint on all images. Moderate interphalangeal joint space narrowing diffusely. Within the limitations of diffuse decreased bone mineralization, no definite acute fracture is seen. IMPRESSION: 1. Within the limitations of diffuse decreased bone mineralization, no definite acute  fracture is seen. 2. There is again mild high-grade pes cavus with mild metatarsus adductus and medial subluxation of the navicular with respect to the talus. 3. There is again high-grade extensor positioning of the great toe metatarsophalangeal joint on all images. Electronically Signed   By: Neita Garnet M.D.   On: 08/19/2023 15:00   CT Cervical Spine Wo Contrast  Result Date: 08/19/2023 CLINICAL DATA:  Larey Seat with trauma to the head and face EXAM: CT  CERVICAL SPINE WITHOUT CONTRAST TECHNIQUE: Multidetector CT imaging of the cervical spine was performed without intravenous contrast. Multiplanar CT image reconstructions were also generated. RADIATION DOSE REDUCTION: This exam was performed according to the departmental dose-optimization program which includes automated exposure control, adjustment of the mA and/or kV according to patient size and/or use of iterative reconstruction technique. COMPARISON:  None Available. FINDINGS: Alignment: Exaggerated cervical lordosis and thoracic kyphosis. Skull base and vertebrae: No regional fracture Soft tissues and spinal canal: No traumatic soft tissue finding. Disc levels: Chronic arthropathy at the C1-2 articulation. Question CPPD. Chronic degenerative spondylosis C3-4 through C7-T1. Facet osteoarthritis most pronounced on the left at C3-4, C6-7 and C7-T1. Upper chest: No acute upper thoracic fracture. Solid bridging anterior osteophytes T5 through T7. Other: None IMPRESSION: No acute or traumatic finding. Chronic degenerative spondylosis and facet osteoarthritis. Electronically Signed   By: Paulina Fusi M.D.   On: 08/19/2023 14:13   CT Maxillofacial Wo Contrast  Result Date: 08/19/2023 CLINICAL DATA:  Fall with trauma to the face and neck EXAM: CT MAXILLOFACIAL WITHOUT CONTRAST TECHNIQUE: Multidetector CT imaging of the maxillofacial structures was performed. Multiplanar CT image reconstructions were also generated. RADIATION DOSE REDUCTION: This exam was performed according to the departmental dose-optimization program which includes automated exposure control, adjustment of the mA and/or kV according to patient size and/or use of iterative reconstruction technique. COMPARISON:  12/23/2021 FINDINGS: Osseous: No acute facial fracture. Chronic mucoperiosteal thickening of the left maxillary sinus. Orbits: Normal. No intraorbital injury. Lateral periorbital soft tissue swelling on the right with soft tissue hematoma.  Sinuses: Chronic opacification of the left maxillary sinus with mucoperiosteal thickening in calcified internal material. Soft tissues: Otherwise negative. Limited intracranial: No traumatic finding. IMPRESSION: 1. No acute facial fracture. Lateral periorbital soft tissue swelling on the right with soft tissue hematoma. 2. Chronic opacification of the left maxillary sinus with mucoperiosteal thickening and calcified internal material. Electronically Signed   By: Paulina Fusi M.D.   On: 08/19/2023 14:10   CT Head Wo Contrast  Result Date: 08/19/2023 CLINICAL DATA:  Larey Seat to the floor with trauma to the head EXAM: CT HEAD WITHOUT CONTRAST TECHNIQUE: Contiguous axial images were obtained from the base of the skull through the vertex without intravenous contrast. RADIATION DOSE REDUCTION: This exam was performed according to the departmental dose-optimization program which includes automated exposure control, adjustment of the mA and/or kV according to patient size and/or use of iterative reconstruction technique. COMPARISON:  12/23/2021 FINDINGS: Brain: Age related volume loss. Chronic small-vessel ischemic change of the white matter. No sign of acute infarction, mass lesion, hemorrhage, hydrocephalus or extra-axial collection. Vascular: There is atherosclerotic calcification of the major vessels at the base of the brain. Skull: Negative.  No skull fracture. Sinuses/Orbits: See results of maxillofacial CT. Other: Right forehead soft tissue hematoma. IMPRESSION: 1. No acute intracranial finding. Age related volume loss. Chronic small-vessel ischemic change of the white matter. 2. Right forehead soft tissue hematoma. Electronically Signed   By: Paulina Fusi M.D.   On: 08/19/2023 14:08  DG Knee Right Port  Result Date: 08/19/2023 CLINICAL DATA:  Fall. EXAM: PORTABLE RIGHT KNEE - 1-2 VIEW COMPARISON:  Right tibia/fibula radiographs dated 07/08/2018. FINDINGS: Diffuse osseous demineralization. Status post right  below-the-knee amputation. The proximal tibial and fibular resection margins appear intact. No acute fracture. The knee joint is anatomically aligned with degenerative changes. IMPRESSION: No acute osseous abnormality. Status post right below-the-knee amputation. Electronically Signed   By: Hart Robinsons M.D.   On: 08/19/2023 13:36   DG Pelvis Portable  Result Date: 08/19/2023 CLINICAL DATA:  Fall. EXAM: PORTABLE PELVIS 1-2 VIEWS COMPARISON:  Hip radiographs dated June 21, 2019. FINDINGS: There is no evidence of pelvic fracture or diastasis. The hip joints are anatomically aligned with left-greater-than-right degenerative changes. Vascular stent grafts are noted. IMPRESSION: No acute fracture on single AP pelvic radiograph. Electronically Signed   By: Hart Robinsons M.D.   On: 08/19/2023 13:34   DG Chest Portable 1 View  Result Date: 08/19/2023 CLINICAL DATA:  Fall. EXAM: PORTABLE CHEST 1 VIEW COMPARISON:  Chest radiograph dated December 30, 2021. FINDINGS: The heart size and mediastinal contours are within normal limits. Aortic arch calcification. Similar chronic interstitial parenchymal changes. No focal consolidation, pneumothorax, or sizable pleural effusion. No acute osseous abnormality. Status post right shoulder arthroplasty. IMPRESSION: No acute findings in the chest. Electronically Signed   By: Hart Robinsons M.D.   On: 08/19/2023 13:32   DG Femur Min 2 Views Left  Result Date: 08/19/2023 CLINICAL DATA:  Fall.  Pain. EXAM: LEFT FEMUR 2 VIEWS COMPARISON:  Hip radiographs dated 07/08/2018. FINDINGS: Minimally displaced oblique fracture at the posteromedial cortex of the distal left femur. No additional fracture identified. Intact left total knee arthroplasty. The left hip joint is anatomically aligned with severe joint space narrowing and marginal osteophytosis. Vascular stents and skin staples are noted. IMPRESSION: 1. Minimally displaced oblique fracture at the posteromedial cortex of  the distal left femur. No additional fracture identified. 2. Severe left hip osteoarthritis. Electronically Signed   By: Hart Robinsons M.D.   On: 08/19/2023 13:29   DG Knee Left Port  Result Date: 08/19/2023 CLINICAL DATA:  Fall.  Bilateral knee pain. EXAM: PORTABLE LEFT KNEE - 1-2 VIEW COMPARISON:  None Available. FINDINGS: Diffusely decreased osseous mineralization. There is a minimally displaced oblique fracture at the posteromedial distal left femoral metadiaphyseal cortex. No additional fracture identified. Lipohemarthrosis is noted. Status post left total knee arthroplasty in anatomic alignment. Vascular stents and skin staples are noted. There is soft tissue swelling about the knee and distal femur. IMPRESSION: 1. Minimally displaced oblique fracture at the posteromedial cortex of the distal left femur. 2. Lipohemarthrosis, concerning for possible additional occult fracture. 3. Intact left total knee arthroplasty in anatomic alignment. Electronically Signed   By: Hart Robinsons M.D.   On: 08/19/2023 13:22    Pending Labs Unresulted Labs (From admission, onward)     Start     Ordered   08/19/23 1458  Urine Culture  Once,   URGENT       Question:  Indication  Answer:  Bacteriuria screening (OB/GYN or Uro)   08/19/23 1457            Vitals/Pain Today's Vitals   08/19/23 1146 08/19/23 1148 08/19/23 1321 08/19/23 1510  BP:      Pulse:  66 66   Resp:  19 (!) 21   Temp:      SpO2:  100% 100%   Weight:      Height:  PainSc: 7  9  10-Worst pain ever Asleep    Isolation Precautions No active isolations  Medications Medications  cefTRIAXone (ROCEPHIN) 1 g in sodium chloride 0.9 % 100 mL IVPB (1 g Intravenous New Bag/Given 08/19/23 1518)  fentaNYL (SUBLIMAZE) injection 50 mcg (50 mcg Intravenous Given 08/19/23 1322)  HYDROmorphone (DILAUDID) injection 0.5 mg (0.5 mg Intravenous Given 08/19/23 1501)    Mobility manual wheelchair at baseline. Bed bound at this time      Focused Assessments     R Recommendations: See Admitting Provider Note  Report given to:   Additional Notes:

## 2023-08-19 NOTE — ED Triage Notes (Signed)
Pt coming in from home . She was transferring with a board fell off the side and hit head on brick floor. Pt has hx of DVTs pain in her left leg. No loc. On plavix.EMS gave 25 mcg of fentinyl  20 Lac  184/70  Gcs 15 Hr 66  98% on ra

## 2023-08-19 NOTE — ED Provider Notes (Signed)
[place] EMERGENCY DEPARTMENT AT [facility] Provider Note   CSN: [id number] Arrival date & time: 08/19/23  1129     History  Chief Complaint  Patient presents with   [name]    [name] is a 89 y.o. female.  The history is provided by the patient, the EMS personnel and medical records. No language interpreter was used.  Fall     89 year old female significant history of fibromyalgia, right BKA, TIA Plavix, prior stroke, atrial fibrillation, DVT, anemia brought here via EMS from family's home for evaluation of a fall.  Patient was visiting a family member today, and she was in the process of transferring herself from her commode to wheelchair when the board that she used to transfer slipped and patient slide on the ground and struck her head against the brick ground.  She denies any loss of consciousness.  She is complaining of pain to her head but primarily pain to her left leg and right stump.  She denies any precipitating symptoms prior to the fall.  Pain is mild to moderate in severity.  States she has chronic left leg pain from prior nerve injury but this pain seems little bit more intense.  She does not endorse any significant neck pain chest pain trouble breathing abdominal pain or lower back pain.  She denies any focal numbness or focal weakness.  She she was brought here as a level 2 trauma in the setting of being on blood thinner medication.  Home Medications Prior to Admission medications   Medication Sig Start Date End Date Taking? Authorizing Provider  acetaminophen (TYLENOL) 500 MG tablet Take 1,000 mg by mouth every 8 (eight) hours as needed for mild pain.     [provider]  Alirocumab (PRALUENT) 150 MG/ML SOAJ Inject 1ml (150 mg total) into the skin every 14 days. 07/21/23   Dyann Kief, PA-C  clopidogrel (PLAVIX) 75 MG tablet Take 1 tablet (75 mg total) by mouth daily. 01/28/17   Angiulli, Mcarthur Rossetti, PA-C  Cyanocobalamin (VITAMIN B-12 PO)  Place 1 tablet under the tongue daily.    [provider]  diazepam (VALIUM) 5 MG tablet Take 0.5 tablets (2.5 mg total) by mouth at bedtime as needed for anxiety. 01/28/17   Angiulli, Mcarthur Rossetti, PA-C  diphenoxylate-atropine (LOMOTIL) 2.5-0.025 MG tablet Take 1 tablet by mouth as needed for diarrhea or loose stools. 06/05/21   [provider]  furosemide (LASIX) 20 MG tablet Take 1 tablet on Monday- Wednesday - Friday and 1/2 tablet on Sundays 10/29/22   Dyann Kief, PA-C  gabapentin (NEURONTIN) 300 MG capsule Take 300 mg by mouth See admin instructions. Take 300mg  by mouth three times daily.  May take an additional 300mg  at bedtime as needed for pain.    [provider]  isosorbide mononitrate (IMDUR) 30 MG 24 hr tablet TAKE ONE TABLET BY MOUTH EVERY EVENING 07/23/22   Lyn Records, MD  levothyroxine (SYNTHROID) 50 MCG tablet Take 50 mcg by mouth every morning. 08/07/21   [provider]  loperamide (IMODIUM) 2 MG capsule Take 2 mg by mouth See admin instructions. Take 2mg  by mouth as needed after each loose stool.  Do not exceed 8 capsules in 24 hours.    [provider]  metoprolol tartrate (LOPRESSOR) 25 MG tablet Take 1 tablet (25 mg total) by mouth at bedtime. Patient taking differently: Take 25 mg by mouth 2 (two) times daily. 01/28/17   Angiulli, Mcarthur Rossetti, PA-C  Multiple Vitamins-Minerals (IMMUNE SUPPORT VITAMIN C PO) Take 1 tablet by mouth 2 (two) times daily.     [provider]  Multiple Vitamins-Minerals (PRESERVISION AREDS PO) Take 1 capsule by mouth in the morning and at bedtime.    [provider]  oxycodone (OXY-IR) 5 MG capsule Take 1 capsule (5 mg total) by mouth every 6 (six) hours as needed for pain. 05/06/20   Emilie Rutter, PA-C  potassium chloride (K-DUR,KLOR-CON) 10 MEQ tablet Take 10 mEq by mouth daily. Take 1 and a half tablet on Sundays    [provider]  valsartan-hydrochlorothiazide (DIOVAN-HCT)  160-12.5 MG tablet Take 0.5 tablets by mouth daily. 01/28/17   Angiulli, Mcarthur Rossetti, PA-C  VITAMIN D PO Take 1,000 Units by mouth daily.    [provider]      Allergies    Motrin [ibuprofen], Statins, Amitriptyline, Augmentin [amoxicillin-pot clavulanate], Doxycycline, Eliquis [apixaban], Ezetimibe, Lisinopril, Lisinopril-hydrochlorothiazide, Niacin and related, Nitrofurantoin, Paxil [paroxetine], Morphine and codeine, Promethazine hcl, and Sulfa antibiotics    Review of Systems   Review of Systems  All other systems reviewed and are negative.   Physical Exam Updated Vital Signs BP 124/64   Pulse 66   Temp (!) 97.5 F (36.4 C)   Resp 19   Ht 5\' 2"  (1.575 m)   Wt 52.2 kg   SpO2 100%   BMI 21.03 kg/m  Physical Exam Vitals and nursing note reviewed.  Constitutional:      General: She is not in acute distress.    Appearance: She is well-developed.     Comments: Patient laying in bed appears to be in no acute discomfort.  HENT:     Head:     Comments: Moderate sized hematoma noted to right orbital region with small skin tear oozing a small blood.  No midface tenderness no Battle sign Eyes:     Extraocular Movements: Extraocular movements intact.     Conjunctiva/sclera: Conjunctivae normal.     Pupils: Pupils are equal, round, and reactive to light.     Comments: No subconjunctival hemorrhage.  Extraocular movement is intact.  Cardiovascular:     Rate and Rhythm: Normal rate and regular rhythm.     Pulses: Normal pulses.     Heart sounds: Normal heart sounds.  Pulmonary:     Effort: Pulmonary effort is normal.     Breath sounds: No wheezing, rhonchi or rales.  Abdominal:     Palpations: Abdomen is soft.     Tenderness: There is no abdominal tenderness.  Musculoskeletal:     Cervical back: Normal range of motion and neck supple.     Comments: Right leg: Patient has BKA.  Tenderness noted to the stump with some bruising noted. Left leg: Tenderness throughout the leg  on palpation without focal point tenderness.  Close deformity of the left foot seems to be chronic without significant increased tenderness.  Skin:    Findings: No rash.  Neurological:     Mental Status: She is alert and oriented to person, place, and time.  Psychiatric:        Mood and Affect: Mood normal.     ED Results / Procedures / Treatments   Labs (all labs ordered are listed, but only abnormal results are displayed) Labs Reviewed  CBC WITH DIFFERENTIAL/PLATELET - Abnormal; Notable for the following components:      Result Value   RBC 3.78 (*)    Hemoglobin 11.7 (*)    Eosinophils Absolute 0.6 (*)  Abs Immature Granulocytes 0.10 (*)    All other components within normal limits  BASIC METABOLIC PANEL - Abnormal; Notable for the following components:   Glucose, Bld 127 (*)    BUN 27 (*)    Creatinine, Ser 1.24 (*)    GFR, Estimated 41 (*)    All other components within normal limits  URINALYSIS, ROUTINE W REFLEX MICROSCOPIC - Abnormal; Notable for the following components:   APPearance CLOUDY (*)    Nitrite POSITIVE (*)    Leukocytes,Ua LARGE (*)    Bacteria, UA MANY (*)    All other components within normal limits  URINE CULTURE    EKG EKG Interpretation Date/Time:  Wednesday August 19 2023 11:43:05 EDT Ventricular Rate:  68 PR Interval:  134 QRS Duration:  101 QT Interval:  400 QTC Calculation: 426 R Axis:   109  Text Interpretation: Sinus rhythm Atrial premature complex Right axis deviation Confirmed by Virgina Norfolk [phone]) on 08/19/2023 12:13:03 PM  Radiology CT Cervical Spine Wo Contrast  Result Date: 08/19/2023 CLINICAL DATA:  Larey Seat with trauma to the head and face EXAM: CT CERVICAL SPINE WITHOUT CONTRAST TECHNIQUE: Multidetector CT imaging of the cervical spine was performed without intravenous contrast. Multiplanar CT image reconstructions were also generated. RADIATION DOSE REDUCTION: This exam was performed according to the departmental  dose-optimization program which includes automated exposure control, adjustment of the mA and/or kV according to patient size and/or use of iterative reconstruction technique. COMPARISON:  None Available. FINDINGS: Alignment: Exaggerated cervical lordosis and thoracic kyphosis. Skull base and vertebrae: No regional fracture Soft tissues and spinal canal: No traumatic soft tissue finding. Disc levels: Chronic arthropathy at the C1-2 articulation. Question CPPD. Chronic degenerative spondylosis C3-4 through C7-T1. Facet osteoarthritis most pronounced on the left at C3-4, C6-7 and C7-T1. Upper chest: No acute upper thoracic fracture. Solid bridging anterior osteophytes T5 through T7. Other: None IMPRESSION: No acute or traumatic finding. Chronic degenerative spondylosis and facet osteoarthritis. Electronically Signed   By: Paulina Fusi M.D.   On: 08/19/2023 14:13   CT Maxillofacial Wo Contrast  Result Date: 08/19/2023 CLINICAL DATA:  Fall with trauma to the face and neck EXAM: CT MAXILLOFACIAL WITHOUT CONTRAST TECHNIQUE: Multidetector CT imaging of the maxillofacial structures was performed. Multiplanar CT image reconstructions were also generated. RADIATION DOSE REDUCTION: This exam was performed according to the departmental dose-optimization program which includes automated exposure control, adjustment of the mA and/or kV according to patient size and/or use of iterative reconstruction technique. COMPARISON:  12/23/2021 FINDINGS: Osseous: No acute facial fracture. Chronic mucoperiosteal thickening of the left maxillary sinus. Orbits: Normal. No intraorbital injury. Lateral periorbital soft tissue swelling on the right with soft tissue hematoma. Sinuses: Chronic opacification of the left maxillary sinus with mucoperiosteal thickening in calcified internal material. Soft tissues: Otherwise negative. Limited intracranial: No traumatic finding. IMPRESSION: 1. No acute facial fracture. Lateral periorbital soft tissue  swelling on the right with soft tissue hematoma. 2. Chronic opacification of the left maxillary sinus with mucoperiosteal thickening and calcified internal material. Electronically Signed   By: Paulina Fusi M.D.   On: 08/19/2023 14:10   CT Head Wo Contrast  Result Date: 08/19/2023 CLINICAL DATA:  Larey Seat to the floor with trauma to the head EXAM: CT HEAD WITHOUT CONTRAST TECHNIQUE: Contiguous axial images were obtained from the base of the skull through the vertex without intravenous contrast. RADIATION DOSE REDUCTION: This exam was performed according to the departmental dose-optimization program which includes automated exposure control, adjustment of the mA and/or  kV according to patient size and/or use of iterative reconstruction technique. COMPARISON:  12/23/2021 FINDINGS: Brain: Age related volume loss. Chronic small-vessel ischemic change of the white matter. No sign of acute infarction, mass lesion, hemorrhage, hydrocephalus or extra-axial collection. Vascular: There is atherosclerotic calcification of the major vessels at the base of the brain. Skull: Negative.  No skull fracture. Sinuses/Orbits: See results of maxillofacial CT. Other: Right forehead soft tissue hematoma. IMPRESSION: 1. No acute intracranial finding. Age related volume loss. Chronic small-vessel ischemic change of the white matter. 2. Right forehead soft tissue hematoma. Electronically Signed   By: Paulina Fusi M.D.   On: 08/19/2023 14:08   DG Knee Right Port  Result Date: 08/19/2023 CLINICAL DATA:  Fall. EXAM: PORTABLE RIGHT KNEE - 1-2 VIEW COMPARISON:  Right tibia/fibula radiographs dated 07/08/2018. FINDINGS: Diffuse osseous demineralization. Status post right below-the-knee amputation. The proximal tibial and fibular resection margins appear intact. No acute fracture. The knee joint is anatomically aligned with degenerative changes. IMPRESSION: No acute osseous abnormality. Status post right below-the-knee amputation.  Electronically Signed   By: Hart Robinsons M.D.   On: 08/19/2023 13:36   DG Pelvis Portable  Result Date: 08/19/2023 CLINICAL DATA:  Fall. EXAM: PORTABLE PELVIS 1-2 VIEWS COMPARISON:  Hip radiographs dated June 21, 2019. FINDINGS: There is no evidence of pelvic fracture or diastasis. The hip joints are anatomically aligned with left-greater-than-right degenerative changes. Vascular stent grafts are noted. IMPRESSION: No acute fracture on single AP pelvic radiograph. Electronically Signed   By: Hart Robinsons M.D.   On: 08/19/2023 13:34   DG Chest Portable 1 View  Result Date: 08/19/2023 CLINICAL DATA:  Fall. EXAM: PORTABLE CHEST 1 VIEW COMPARISON:  Chest radiograph dated December 30, 2021. FINDINGS: The heart size and mediastinal contours are within normal limits. Aortic arch calcification. Similar chronic interstitial parenchymal changes. No focal consolidation, pneumothorax, or sizable pleural effusion. No acute osseous abnormality. Status post right shoulder arthroplasty. IMPRESSION: No acute findings in the chest. Electronically Signed   By: Hart Robinsons M.D.   On: 08/19/2023 13:32   DG Femur Min 2 Views Left  Result Date: 08/19/2023 CLINICAL DATA:  Fall.  Pain. EXAM: LEFT FEMUR 2 VIEWS COMPARISON:  Hip radiographs dated 07/08/2018. FINDINGS: Minimally displaced oblique fracture at the posteromedial cortex of the distal left femur. No additional fracture identified. Intact left total knee arthroplasty. The left hip joint is anatomically aligned with severe joint space narrowing and marginal osteophytosis. Vascular stents and skin staples are noted. IMPRESSION: 1. Minimally displaced oblique fracture at the posteromedial cortex of the distal left femur. No additional fracture identified. 2. Severe left hip osteoarthritis. Electronically Signed   By: Hart Robinsons M.D.   On: 08/19/2023 13:29   DG Knee Left Port  Result Date: 08/19/2023 CLINICAL DATA:  Fall.  Bilateral knee pain. EXAM:  PORTABLE LEFT KNEE - 1-2 VIEW COMPARISON:  None Available. FINDINGS: Diffusely decreased osseous mineralization. There is a minimally displaced oblique fracture at the posteromedial distal left femoral metadiaphyseal cortex. No additional fracture identified. Lipohemarthrosis is noted. Status post left total knee arthroplasty in anatomic alignment. Vascular stents and skin staples are noted. There is soft tissue swelling about the knee and distal femur. IMPRESSION: 1. Minimally displaced oblique fracture at the posteromedial cortex of the distal left femur. 2. Lipohemarthrosis, concerning for possible additional occult fracture. 3. Intact left total knee arthroplasty in anatomic alignment. Electronically Signed   By: Hart Robinsons M.D.   On: 08/19/2023 13:22    Procedures .Marland KitchenLaceration  Repair  Date/Time: 08/19/2023 3:32 PM  Performed by: Fayrene Helper, PA-C Authorized by: Fayrene Helper, PA-C   Consent:    Consent obtained:  Verbal   Consent given by:  Patient   Risks, benefits, and alternatives were discussed: yes     Risks discussed:  Infection and poor wound healing   Alternatives discussed:  No treatment Universal protocol:    Procedure explained and questions answered to patient or proxy's satisfaction: yes     Relevant documents present and verified: yes     Patient identity confirmed:  Verbally with patient and arm band Laceration details:    Location:  Face   Face location:  R eyebrow   Length (cm):  2   Depth (mm):  2 Pre-procedure details:    Preparation:  Patient was prepped and draped in usual sterile fashion Exploration:    Limited defect created (wound extended): no     Imaging outcome: foreign body not noted     Wound exploration: wound explored through full range of motion and entire depth of wound visualized   Treatment:    Area cleansed with:  Saline   Amount of cleaning:  Standard   Irrigation solution:  Sterile saline   Irrigation method:  Pressure wash    Debridement:  None   Undermining:  None   Scar revision: no   Skin repair:    Repair method:  Tissue adhesive and Steri-Strips   Number of Steri-Strips:  2 Approximation:    Approximation:  Close Repair type:    Repair type:  Simple Post-procedure details:    Procedure completion:  Tolerated well, no immediate complications     Medications Ordered in ED Medications  HYDROmorphone (DILAUDID) injection 0.5 mg (has no administration in time range)  cefTRIAXone (ROCEPHIN) 1 g in sodium chloride 0.9 % 100 mL IVPB (has no administration in time range)  fentaNYL (SUBLIMAZE) injection 50 mcg (50 mcg Intravenous Given 08/19/23 1322)    ED Course/ Medical Decision Making/ A&P                                 Medical Decision Making Amount and/or Complexity of Data Reviewed Labs: ordered. Radiology: ordered.  Risk Prescription drug management.   BP 124/64   Pulse 66   Temp (!) 97.5 F (36.4 C)   Resp 19   Ht 5\' 2"  (1.575 m)   Wt 52.2 kg   SpO2 100%   BMI 21.03 kg/m   52:55 PM  89 year old female significant history of fibromyalgia, right BKA, TIA Plavix, prior stroke, atrial fibrillation, DVT, anemia brought here via EMS from family's home for evaluation of a fall.  Patient was visiting a family member today, and she was in the process of transferring herself from her commode to wheelchair when the board that she used to transfer slipped and patient slide on the ground and struck her head against the brick ground.  She denies any loss of consciousness.  She is complaining of pain to her head but primarily pain to her left leg and right stump.  She denies any precipitating symptoms prior to the fall.  Pain is mild to moderate in severity.  States she has chronic left leg pain from prior nerve injury but this pain seems little bit more intense.  She does not endorse any significant neck pain chest pain trouble breathing abdominal pain or lower back pain.  She denies any focal  numbness  or focal weakness.  She she was brought here as a level 2 trauma in the setting of being on blood thinner medication.  On exam, patient is laying in bed appears to be in no acute discomfort.  She does have an ecchymosis noted to her right orbital region with a small skin changes oozing a small amount of blood.  She does not have any evidence of ocular entrapment on exam.  She also has tenderness about her left leg and her right stump.  Small bruise noted to her left hand at the second metacarpal region but minimal tenderness to palpation.  Appropriate imaging have been ordered.  Pain medication given.  Workup initiated.  Care discussed with Dr. Lockie Mola  -Labs ordered, independently viewed and interpreted by me.  Labs remarkable for UA showing finding consistent with UTI, will treat with rocephin.  Normal WBC.  Cr. 1.24 similar to baseline  -The patient was maintained on a cardiac monitor.  I personally viewed and interpreted the cardiac monitored which showed an underlying rhythm of: NSR -Imaging independently viewed and interpreted by me and I agree with radiologist's interpretation.  Result remarkable for L femur showing distal L femur and possible occult fx of the L knee -This patient presents to the ED for concern of fall, this involves an extensive number of treatment options, and is a complaint that carries with it a high risk of complications and morbidity.  The differential diagnosis includes UTI, mechanical fall, fx, dislocation, strain, sprain, contusion, anemia, electrolyte imbalance -Co morbidities that complicate the patient evaluation includes on eliquis,  -Treatment includes dilaudid, fentanyl, rocephin,  -Reevaluation of the patient after these medicines showed that the patient stayed the same -PCP office notes or outside notes reviewed -Discussion with specialist orthopedist team who will be involve in pt's injury.  Appreciate consultation with hospitalist Dr. Ashok Pall who will admit pt.   -Escalation to admission/observation considered: patient is amenable for admission        Final Clinical Impression(s) / ED Diagnoses Final diagnoses:  Closed fracture of left femur, unspecified fracture morphology, unspecified portion of femur, initial encounter (HCC)  Fall in home, initial encounter  Laceration of skin of face, initial encounter  Periprosthetic fracture around internal prosthetic left knee joint, initial encounter  Lower urinary tract infectious disease    Rx / DC Orders ED Discharge Orders     None         Fayrene Helper, PA-C 08/19/23 1532    Virgina Norfolk, DO 08/22/23 [phone]

## 2023-08-19 NOTE — ED Provider Notes (Signed)
Shared PA visit.  Patient here after mechanical fall.  On blood thinners.  Level 2 trauma.  Pain mostly to the left thigh.  Bruising over the right eyebrow.  Head and neck and face images unremarkable for acute processes.  But she does have a minimally displaced oblique fracture of the posterior medial cortex of the distal left femur.  Lab work collected.  Overall we have talked with orthopedics.  Will admit for pain control and further orthopedic care.  Hemodynamically stable throughout her care.  This chart was dictated using voice recognition software.  Despite best efforts to proofread,  errors can occur which can change the documentation meaning.    Karen Norfolk, DO 08/19/23 1428

## 2023-08-19 NOTE — Consult Note (Signed)
Reason for Consult:Left distal femur fx Referring Physician: Virgina Norfolk Time called: 1412 Time at bedside: 1422   [name] is an 89 y.o. female.  HPI: Yannis fell today while transferring to her [facility]. She had immediate left knee pain. She was brought to the ED where x-rays showed a left distal femur fx and orthopedic surgery was consulted. She lives at home and is not ambulatory. She also apparently only has collateral blood flow in her left leg.  Past Medical History:  Diagnosis Date   Anemia    Anginal pain (HCC)    Arthritis    "qwhere" (09/05/2016)   Atrial fibrillation (HCC) 09/2016   Chronic lower back pain    Complication of anesthesia    "takes a long time for it to wear off; I can hallucinate if I take too much" (09/05/2016)   DVT (deep venous thrombosis) (HCC) 10/2009   Fall from steps 08/31/2013   Fx. pelvis, Left Hip, Left Elbow   Fibromyalgia    GERD (gastroesophageal reflux disease)    09/22/16- "no too much anymore"   GI bleed 10/24/2016   High cholesterol    History of blood transfusion    History of hiatal hernia    Hypertension    Macular degeneration, wet (HCC)    "started in right eye; now legally blind in that eye; now started in left eye but pretty much in control" (09/05/2016)   Osteoporosis    Peripheral vascular disease (HCC)    nonviable tissue Right foot   PONV (postoperative nausea and vomiting)    Squamous cell carcinoma of skin of right calf Aug. 2015   Stroke [facility])    TIA's no residual   TIA (transient ischemic attack)    "several at once; none in a long time" (09/05/2016)    Past Surgical History:  Procedure Laterality Date   ABDOMINAL AORTAGRAM N/A 12/26/2014   Procedure: ABDOMINAL Ronny Flurry;  Surgeon: Nada Libman, MD;  Location: [facility] CATH LAB;  Service: Cardiovascular;  Laterality: N/A;   AMPUTATION Right 12/23/2016   Procedure: AMPUTATION BELOW KNEE;  Surgeon: Nada Libman, MD;  Location: [facility] OR;  Service: Vascular;   Laterality: Right;   AORTOGRAM N/A 09/26/2016   Procedure: AORTOGRAM;  Surgeon: Maeola Harman, MD;  Location: [facility] OR;  Service: Vascular;  Laterality: N/A;   CARPAL TUNNEL RELEASE Right    CATARACT EXTRACTION W/ INTRAOCULAR LENS  IMPLANT, BILATERAL Bilateral    COLONOSCOPY     DILATION AND CURETTAGE OF UTERUS     DRESSING CHANGE UNDER ANESTHESIA Right 01/16/2017   Procedure: DRESSING CHANGE RIGHT BELOW KNEE AMPUTATION;  Surgeon: Nada Libman, MD;  Location: [facility] OR;  Service: Vascular;  Laterality: Right;   EYE SURGERY Right    "macular OR"   FEMORAL ARTERY STENT  12-11-10   Left SFA   FEMORAL-POPLITEAL BYPASS GRAFT Left 09/24/2016   Procedure: REDO FEMORAL TO POPLITEAL ARTERY BYPASS GRAFT USING PROPATEN RINGED GORTEX GRAFT;  Surgeon: Nada Libman, MD;  Location: [facility] OR;  Service: Vascular;  Laterality: Left;   FEMORAL-TIBIAL BYPASS GRAFT Left 01/16/2017   Procedure: REDO BYPASS GRAFT FEMORAL-TIBIAL ARTERY WITH GORTEX GRAFT;  Surgeon: Nada Libman, MD;  Location: [facility] OR;  Service: Vascular;  Laterality: Left;  AND LOWER LEG    I & D EXTREMITY Right 12/17/2016   Procedure: IRRIGATION AND DEBRIDEMENT RIGHT FOOT;  Surgeon: Nada Libman, MD;  Location: [facility] OR;  Service: Vascular;  Laterality: Right;   INCISION  AND DRAINAGE OF WOUND Left 10/25/2009   leg/notes 11/13/2009   INSERTION OF ILIAC STENT Left 12/26/2014   Procedure: INSERTION OF ILIAC STENT;  Surgeon: Nada Libman, MD;  Location: [facility] CATH LAB;  Service: Cardiovascular;  Laterality: Left;   INSERTION OF ILIAC STENT Left 09/26/2016   Procedure: SUB INTIMAL INSERTION OF SUPERFICIAL FEMORAL ARTERY AND BELOW KNEE BYPASS GRAFT;  Surgeon: Maeola Harman, MD;  Location: [facility] OR;  Service: Vascular;  Laterality: Left;   INSERTION OF ILIAC STENT Left 05/04/2020   Procedure: INSERTION OF SUPERFICIAL FEMORAL ARTERY BYPASS GRAFT STENT;  Surgeon: Nada Libman, MD;  Location: [facility] OR;  Service: Vascular;  Laterality: Left;    JOINT REPLACEMENT     knee   JOINT REPLACEMENT Left Oct. 17, 2014   Elbow ( pt fell 08-31-13 )   LOWER EXTREMITY ANGIOGRAM Left 12/27/2019   Procedure: Intraoperative Left Lower Extremity Angiogram;  Surgeon: Chuck Hint, MD;  Location: [facility] OR;  Service: Vascular;  Laterality: Left;   LOWER EXTREMITY ANGIOGRAPHY N/A 09/21/2018   Procedure: LOWER EXTREMITY ANGIOGRAPHY;  Surgeon: Nada Libman, MD;  [facility]: [facility] INVASIVE CV LAB;  Service: Cardiovascular;  Laterality: N/A;   ORIF SHOULDER FRACTURE Right    "it was crushed"   PATCH ANGIOPLASTY Left 05/04/2020   Procedure: BALLOON  ANGIOPLASTY;  Surgeon: Nada Libman, MD;  Location: [facility] OR;  Service: Vascular;  Laterality: Left;   PERIPHERAL VASCULAR CATHETERIZATION N/A 10/30/2015   Procedure: Abdominal Aortogram w/Lower Extremity;  Surgeon: Nada Libman, MD;  [facility] INVASIVE CV LAB;  Service: Cardiovascular;  Laterality: N/A;   PERIPHERAL VASCULAR CATHETERIZATION  10/30/2015   Procedure: Peripheral Vascular Intervention;  Surgeon: Nada Libman, MD;  Location: [facility] INVASIVE CV LAB;  Service: Cardiovascular;;   PERIPHERAL VASCULAR CATHETERIZATION N/A 04/01/2016   Procedure: Abdominal Aortogram w/Lower Extremity;  Surgeon: Nada Libman, MD;  [facility]: [facility] INVASIVE CV LAB;  Service: Cardiovascular;  Laterality: N/A;   PERIPHERAL VASCULAR CATHETERIZATION Left 04/01/2016   Procedure: Peripheral Vascular Atherectomy;  Surgeon: Nada Libman, MD;  [facility]: [facility] INVASIVE CV LAB;  Service: Cardiovascular;  Laterality: Left;  Superficial femoral artery.   PERIPHERAL VASCULAR CATHETERIZATION Right 09/05/2016   "stent"   PERIPHERAL VASCULAR CATHETERIZATION N/A 09/05/2016   Procedure: Abdominal Aortogram w/Lower Extremity;  Surgeon: Nada Libman, MD;  [facility]: [facility] INVASIVE CV LAB;  Service: Cardiovascular;  Laterality: N/A;   PERIPHERAL VASCULAR CATHETERIZATION Right 09/05/2016   Procedure: Peripheral Vascular Intervention;  Surgeon:  Nada Libman, MD;  [facility]: [facility] INVASIVE CV LAB;  Service: Cardiovascular;  Laterality: Right;  Superficial Femoral   PERIPHERAL VASCULAR CATHETERIZATION Left 09/09/2016   Procedure: Lower Extremity Angiography;  Surgeon: Nada Libman, MD;  Location: [facility] INVASIVE CV LAB;  Service: Cardiovascular;  Laterality: Left;   PR VEIN BYPASS GRAFT,AORTO-FEM-POP  09-13-09   Left Fem-pop   THROMBECTOMY FEMORAL ARTERY Right 09/26/2016   Procedure: Thromboembolectomy Right Lower Extremity, Right Femoral Artery Endarterectomy with Patch Angioplasty; Right Lower Extremity Angiogram ;  Surgeon: Maeola Harman, MD;  Location: [facility] OR;  Service: Vascular;  Laterality: Right;   THROMBECTOMY FEMORAL ARTERY Left 12/27/2019   Procedure: Redo left femoral artery exposure, Thrombectomy of left deep femoral artery to anterior tibial artery bypass;  Surgeon: Chuck Hint, MD;  Location: [facility] OR;  Service: Vascular;  Laterality: Left;   THROMBECTOMY FEMORAL ARTERY Left 05/04/2020   Procedure: THROMBECTOMY LEFT LEG;  Surgeon: Nada Libman, MD;  Location: [facility] OR;  Service:  Vascular;  Laterality: Left;   TOTAL ELBOW ARTHROPLASTY Left 09/03/2013   Procedure: LEFT TOTAL ELBOW ARTHROPLASTY;  Surgeon: Dominica Severin, MD;  Location: [facility] OR;  Service: Orthopedics;  Laterality: Left;   TOTAL KNEE ARTHROPLASTY Left 06/2006   TRANSMETATARSAL AMPUTATION Right 12/11/2016   Procedure: TRANSMETATARSAL AMPUTATION;  Surgeon: Nada Libman, MD;  Location: [facility] OR;  Service: Vascular;  Laterality: Right;   TUBAL LIGATION     VAGINAL HYSTERECTOMY      Family History  Problem Relation Age of Onset   Heart disease Father        Heart Disease before age 16   Hyperlipidemia Father    Hypertension Father    Alcohol abuse Father    Heart disease Brother    Hyperlipidemia Brother    Hypertension Brother    Deep vein thrombosis Brother    Heart disease Son        Heart Disease before age 80   Hyperlipidemia Son     Hypertension Son    Heart attack Son    Diabetes Son    Hypertension Son    Hyperlipidemia Sister    Hypertension Sister     Social History:  reports that she quit smoking about 76 years ago. Her smoking use included cigarettes. She has never been exposed to tobacco smoke. She has never used smokeless tobacco. She reports that she does not drink alcohol and does not use drugs.  Allergies:  Allergies  Allergen Reactions   Motrin [Ibuprofen] Other (See Comments)    ADVERSE REACTION - GI BLEED   Statins Other (See Comments)    ADVERSE REACTION MUSCLE PAIN & WEAKNESS   Amitriptyline Other (See Comments)    Unknown reaction per daughter, unable to tolerate   Augmentin [Amoxicillin-Pot Clavulanate] Diarrhea   Doxycycline Diarrhea   Eliquis [Apixaban] Other (See Comments)    Lower GI bleeding   Ezetimibe Other (See Comments)   Lisinopril Cough   Lisinopril-Hydrochlorothiazide     Other reaction(s): felt "bad" on   Niacin And Related Other (See Comments)   Nitrofurantoin Other (See Comments)   Paxil [Paroxetine] Other (See Comments)   Morphine And Codeine Other (See Comments)    HALLUCINATIONS REACTION IS SIDE EFFECT   Promethazine Hcl Other (See Comments)    IV causes confusion, oral better   Sulfa Antibiotics Nausea And Vomiting    Medications: I have reviewed the patient's current medications.  Results for orders placed or performed during the hospital encounter of 08/19/23 (from the past 48 hour(s))  CBC with Differential     Status: Abnormal   Collection Time: 08/19/23 11:37 AM  Result Value Ref Range   WBC 8.9 4.0 - 10.5 K/uL   RBC 3.78 (L) 3.87 - 5.11 MIL/uL   Hemoglobin 11.7 (L) 12.0 - 15.0 g/dL   HCT 96.2 95.2 - 84.1 %   MCV 96.6 80.0 - 100.0 fL   MCH 31.0 26.0 - 34.0 pg   MCHC 32.1 30.0 - 36.0 g/dL   RDW 32.4 40.1 - 02.7 %   Platelets 209 150 - 400 K/uL   nRBC 0.0 0.0 - 0.2 %   Neutrophils Relative % 43 %   Neutro Abs 3.9 1.7 - 7.7 K/uL   Lymphocytes  Relative 37 %   Lymphs Abs 3.3 0.7 - 4.0 K/uL   Monocytes Relative 11 %   Monocytes Absolute 0.9 0.1 - 1.0 K/uL   Eosinophils Relative 7 %   Eosinophils Absolute 0.6 (H) 0.0 - 0.5 K/uL  Basophils Relative 1 %   Basophils Absolute 0.1 0.0 - 0.1 K/uL   Immature Granulocytes 1 %   Abs Immature Granulocytes 0.10 (H) 0.00 - 0.07 K/uL    Comment: Performed at [facility], [address], [place], [place] [address]    CT Cervical Spine Wo Contrast  Result Date: 08/19/2023 CLINICAL DATA:  Larey Seat with trauma to the head and face EXAM: CT CERVICAL SPINE WITHOUT CONTRAST TECHNIQUE: Multidetector CT imaging of the cervical spine was performed without intravenous contrast. Multiplanar CT image reconstructions were also generated. RADIATION DOSE REDUCTION: This exam was performed according to the departmental dose-optimization program which includes automated exposure control, adjustment of the mA and/or kV according to patient size and/or use of iterative reconstruction technique. COMPARISON:  None Available. FINDINGS: Alignment: Exaggerated cervical lordosis and thoracic kyphosis. Skull base and vertebrae: No regional fracture Soft tissues and spinal canal: No traumatic soft tissue finding. Disc levels: Chronic arthropathy at the C1-2 articulation. Question CPPD. Chronic degenerative spondylosis C3-4 through C7-T1. Facet osteoarthritis most pronounced on the left at C3-4, C6-7 and C7-T1. Upper chest: No acute upper thoracic fracture. Solid bridging anterior osteophytes T5 through T7. Other: None IMPRESSION: No acute or traumatic finding. Chronic degenerative spondylosis and facet osteoarthritis. Electronically Signed   By: Paulina Fusi M.D.   On: 08/19/2023 14:13   CT Maxillofacial Wo Contrast  Result Date: 08/19/2023 CLINICAL DATA:  Fall with trauma to the face and neck EXAM: CT MAXILLOFACIAL WITHOUT CONTRAST TECHNIQUE: Multidetector CT imaging of the maxillofacial structures was performed. Multiplanar  CT image reconstructions were also generated. RADIATION DOSE REDUCTION: This exam was performed according to the departmental dose-optimization program which includes automated exposure control, adjustment of the mA and/or kV according to patient size and/or use of iterative reconstruction technique. COMPARISON:  12/23/2021 FINDINGS: Osseous: No acute facial fracture. Chronic mucoperiosteal thickening of the left maxillary sinus. Orbits: Normal. No intraorbital injury. Lateral periorbital soft tissue swelling on the right with soft tissue hematoma. Sinuses: Chronic opacification of the left maxillary sinus with mucoperiosteal thickening in calcified internal material. Soft tissues: Otherwise negative. Limited intracranial: No traumatic finding. IMPRESSION: 1. No acute facial fracture. Lateral periorbital soft tissue swelling on the right with soft tissue hematoma. 2. Chronic opacification of the left maxillary sinus with mucoperiosteal thickening and calcified internal material. Electronically Signed   By: Paulina Fusi M.D.   On: 08/19/2023 14:10   CT Head Wo Contrast  Result Date: 08/19/2023 CLINICAL DATA:  Larey Seat to the floor with trauma to the head EXAM: CT HEAD WITHOUT CONTRAST TECHNIQUE: Contiguous axial images were obtained from the base of the skull through the vertex without intravenous contrast. RADIATION DOSE REDUCTION: This exam was performed according to the departmental dose-optimization program which includes automated exposure control, adjustment of the mA and/or kV according to patient size and/or use of iterative reconstruction technique. COMPARISON:  12/23/2021 FINDINGS: Brain: Age related volume loss. Chronic small-vessel ischemic change of the white matter. No sign of acute infarction, mass lesion, hemorrhage, hydrocephalus or extra-axial collection. Vascular: There is atherosclerotic calcification of the major vessels at the base of the brain. Skull: Negative.  No skull fracture.  Sinuses/Orbits: See results of maxillofacial CT. Other: Right forehead soft tissue hematoma. IMPRESSION: 1. No acute intracranial finding. Age related volume loss. Chronic small-vessel ischemic change of the white matter. 2. Right forehead soft tissue hematoma. Electronically Signed   By: Paulina Fusi M.D.   On: 08/19/2023 14:08   DG Knee Right Port  Result  Date: 08/19/2023 CLINICAL DATA:  Fall. EXAM: PORTABLE RIGHT KNEE - 1-2 VIEW COMPARISON:  Right tibia/fibula radiographs dated 07/08/2018. FINDINGS: Diffuse osseous demineralization. Status post right below-the-knee amputation. The proximal tibial and fibular resection margins appear intact. No acute fracture. The knee joint is anatomically aligned with degenerative changes. IMPRESSION: No acute osseous abnormality. Status post right below-the-knee amputation. Electronically Signed   By: Hart Robinsons M.D.   On: 08/19/2023 13:36   DG Pelvis Portable  Result Date: 08/19/2023 CLINICAL DATA:  Fall. EXAM: PORTABLE PELVIS 1-2 VIEWS COMPARISON:  Hip radiographs dated June 21, 2019. FINDINGS: There is no evidence of pelvic fracture or diastasis. The hip joints are anatomically aligned with left-greater-than-right degenerative changes. Vascular stent grafts are noted. IMPRESSION: No acute fracture on single AP pelvic radiograph. Electronically Signed   By: Hart Robinsons M.D.   On: 08/19/2023 13:34   DG Chest Portable 1 View  Result Date: 08/19/2023 CLINICAL DATA:  Fall. EXAM: PORTABLE CHEST 1 VIEW COMPARISON:  Chest radiograph dated December 30, 2021. FINDINGS: The heart size and mediastinal contours are within normal limits. Aortic arch calcification. Similar chronic interstitial parenchymal changes. No focal consolidation, pneumothorax, or sizable pleural effusion. No acute osseous abnormality. Status post right shoulder arthroplasty. IMPRESSION: No acute findings in the chest. Electronically Signed   By: Hart Robinsons M.D.   On: 08/19/2023 13:32    DG Femur Min 2 Views Left  Result Date: 08/19/2023 CLINICAL DATA:  Fall.  Pain. EXAM: LEFT FEMUR 2 VIEWS COMPARISON:  Hip radiographs dated 07/08/2018. FINDINGS: Minimally displaced oblique fracture at the posteromedial cortex of the distal left femur. No additional fracture identified. Intact left total knee arthroplasty. The left hip joint is anatomically aligned with severe joint space narrowing and marginal osteophytosis. Vascular stents and skin staples are noted. IMPRESSION: 1. Minimally displaced oblique fracture at the posteromedial cortex of the distal left femur. No additional fracture identified. 2. Severe left hip osteoarthritis. Electronically Signed   By: Hart Robinsons M.D.   On: 08/19/2023 13:29   DG Knee Left Port  Result Date: 08/19/2023 CLINICAL DATA:  Fall.  Bilateral knee pain. EXAM: PORTABLE LEFT KNEE - 1-2 VIEW COMPARISON:  None Available. FINDINGS: Diffusely decreased osseous mineralization. There is a minimally displaced oblique fracture at the posteromedial distal left femoral metadiaphyseal cortex. No additional fracture identified. Lipohemarthrosis is noted. Status post left total knee arthroplasty in anatomic alignment. Vascular stents and skin staples are noted. There is soft tissue swelling about the knee and distal femur. IMPRESSION: 1. Minimally displaced oblique fracture at the posteromedial cortex of the distal left femur. 2. Lipohemarthrosis, concerning for possible additional occult fracture. 3. Intact left total knee arthroplasty in anatomic alignment. Electronically Signed   By: Hart Robinsons M.D.   On: 08/19/2023 13:22    Review of Systems  HENT:  Negative for ear discharge, ear pain, hearing loss and tinnitus.   Eyes:  Negative for photophobia and pain.  Respiratory:  Negative for cough and shortness of breath.   Cardiovascular:  Negative for chest pain.  Gastrointestinal:  Negative for abdominal pain, nausea and vomiting.  Genitourinary:  Negative for  dysuria, flank pain, frequency and urgency.  Musculoskeletal:  Positive for arthralgias (Left knee). Negative for back pain, myalgias and neck pain.  Neurological:  Negative for dizziness and headaches.  Hematological:  Does not bruise/bleed easily.  Psychiatric/Behavioral:  The patient is not nervous/anxious.    Blood pressure 124/64, pulse 66, temperature (!) 97.5 F (36.4 C), resp. rate (!) 21,  height 5\' 2"  (1.575 m), weight 52.2 kg, SpO2 100%. Physical Exam Constitutional:      General: She is not in acute distress.    Appearance: She is well-developed. She is not diaphoretic.  HENT:     Head: Normocephalic and atraumatic.  Eyes:     General: No scleral icterus.       Right eye: No discharge.        Left eye: No discharge.     Conjunctiva/sclera: Conjunctivae normal.  Cardiovascular:     Rate and Rhythm: Normal rate and regular rhythm.  Pulmonary:     Effort: Pulmonary effort is normal. No respiratory distress.  Musculoskeletal:     Cervical back: Normal range of motion.     Comments: LLE No traumatic wounds, ecchymosis, or rash  Severe TTP, mostly knee but entire leg  No knee or ankle effusion  Sens DPN, SPN, TN intact  Motor EHL, ext, flex, evers 0/5  DP 0, PT 0, No significant edema  Skin:    General: Skin is warm and dry.  Neurological:     Mental Status: She is alert.  Psychiatric:        Mood and Affect: Mood normal.        Behavior: Behavior normal.     Assessment/Plan: Left distal femur fx -- Will plan on non-operative management given premorbid issues. KI when transferring. F/u with Dr. Jena Gauss in 2 weeks.    Freeman Caldron, PA-C Orthopedic Surgery [phone] 08/19/2023, 2:30 PM

## 2023-08-19 NOTE — Progress Notes (Signed)
Orthopedic Tech Progress Note Patient Details:  [name] 1927-12-22 [id number] Knee Immobilizer was sized to patient but not applied.  Ortho Devices Type of Ortho Device: Knee Immobilizer Ortho Device/Splint Location: LLE Ortho Device/Splint Interventions: Ordered, Adjustment      [name] E Skylinn Vialpando 08/19/2023, 3:19 PM

## 2023-08-19 NOTE — Progress Notes (Signed)
Orthopedic Tech Progress Note Patient Details:  [name] [birth year] [id number] Level 2 Trauma  Patient ID: [name], female   DOB: [birth year], 89 y.o.   MRN: [id number]  [name] 08/19/2023, 12:12 PM

## 2023-08-19 NOTE — H&P (Signed)
History and Physical    [name]:[id number] DOB: 1928/04/25 DOA: 08/19/2023  PCP: Marden Noble, MD  Patient coming from: home   Chief Complaint: fall  HPI: [name] is a 89 y.o. female with medical history significant for gi bleed, hfpef, htn, pad, s/p right BKA, CVA, a-fib, who presents with a fall.  Resides at SNF, bedbound at baseline, gets around in a wheelchair, was home visiting daughter today, in usual state of health, when fell while transitioning out of wheelchair. Trauma to head and lower extremities. No dysuria or fever or chest pain or n/v/d.   Review of Systems: As per HPI otherwise 10 point review of systems negative.    Past Medical History:  Diagnosis Date   Anemia    Anginal pain (HCC)    Arthritis    "qwhere" (09/05/2016)   Atrial fibrillation (HCC) 09/2016   Chronic lower back pain    Complication of anesthesia    "takes a long time for it to wear off; I can hallucinate if I take too much" (09/05/2016)   DVT (deep venous thrombosis) (HCC) 10/2009   Fall from steps 08/31/2013   Fx. pelvis, Left Hip, Left Elbow   Fibromyalgia    GERD (gastroesophageal reflux disease)    09/22/16- "no too much anymore"   GI bleed 10/24/2016   High cholesterol    History of blood transfusion    History of hiatal hernia    Hypertension    Macular degeneration, wet (HCC)    "started in right eye; now legally blind in that eye; now started in left eye but pretty much in control" (09/05/2016)   Osteoporosis    Peripheral vascular disease (HCC)    nonviable tissue Right foot   PONV (postoperative nausea and vomiting)    Squamous cell carcinoma of skin of right calf Aug. 2015   Stroke [facility])    TIA's no residual   TIA (transient ischemic attack)    "several at once; none in a long time" (09/05/2016)    Past Surgical History:  Procedure Laterality Date   ABDOMINAL AORTAGRAM N/A 12/26/2014   Procedure: ABDOMINAL Ronny Flurry;  Surgeon: Nada Libman, MD;   Location: [facility] CATH LAB;  Service: Cardiovascular;  Laterality: N/A;   AMPUTATION Right 12/23/2016   Procedure: AMPUTATION BELOW KNEE;  Surgeon: Nada Libman, MD;  Location: [facility] OR;  Service: Vascular;  Laterality: Right;   AORTOGRAM N/A 09/26/2016   Procedure: AORTOGRAM;  Surgeon: Maeola Harman, MD;  Location: [facility] OR;  Service: Vascular;  Laterality: N/A;   CARPAL TUNNEL RELEASE Right    CATARACT EXTRACTION W/ INTRAOCULAR LENS  IMPLANT, BILATERAL Bilateral    COLONOSCOPY     DILATION AND CURETTAGE OF UTERUS     DRESSING CHANGE UNDER ANESTHESIA Right 01/16/2017   Procedure: DRESSING CHANGE RIGHT BELOW KNEE AMPUTATION;  Surgeon: Nada Libman, MD;  Location: [facility] OR;  Service: Vascular;  Laterality: Right;   EYE SURGERY Right    "macular OR"   FEMORAL ARTERY STENT  12-11-10   Left SFA   FEMORAL-POPLITEAL BYPASS GRAFT Left 09/24/2016   Procedure: REDO FEMORAL TO POPLITEAL ARTERY BYPASS GRAFT USING PROPATEN RINGED GORTEX GRAFT;  Surgeon: Nada Libman, MD;  Location: [facility] OR;  Service: Vascular;  Laterality: Left;   FEMORAL-TIBIAL BYPASS GRAFT Left 01/16/2017   Procedure: REDO BYPASS GRAFT FEMORAL-TIBIAL ARTERY WITH GORTEX GRAFT;  Surgeon: Nada Libman, MD;  Location: [facility] OR;  Service: Vascular;  Laterality: Left;  AND  LOWER LEG    I & D EXTREMITY Right 12/17/2016   Procedure: IRRIGATION AND DEBRIDEMENT RIGHT FOOT;  Surgeon: Nada Libman, MD;  Location: [facility] OR;  Service: Vascular;  Laterality: Right;   INCISION AND DRAINAGE OF WOUND Left 10/25/2009   leg/notes 11/13/2009   INSERTION OF ILIAC STENT Left 12/26/2014   Procedure: INSERTION OF ILIAC STENT;  Surgeon: Nada Libman, MD;  Location: [facility] CATH LAB;  Service: Cardiovascular;  Laterality: Left;   INSERTION OF ILIAC STENT Left 09/26/2016   Procedure: SUB INTIMAL INSERTION OF SUPERFICIAL FEMORAL ARTERY AND BELOW KNEE BYPASS GRAFT;  Surgeon: Maeola Harman, MD;  Location: [facility] OR;  Service: Vascular;  Laterality: Left;    INSERTION OF ILIAC STENT Left 05/04/2020   Procedure: INSERTION OF SUPERFICIAL FEMORAL ARTERY BYPASS GRAFT STENT;  Surgeon: Nada Libman, MD;  Location: [facility] OR;  Service: Vascular;  Laterality: Left;   JOINT REPLACEMENT     knee   JOINT REPLACEMENT Left Oct. 17, 2014   Elbow ( pt fell 08-31-13 )   LOWER EXTREMITY ANGIOGRAM Left 12/27/2019   Procedure: Intraoperative Left Lower Extremity Angiogram;  Surgeon: Chuck Hint, MD;  Location: [facility] OR;  Service: Vascular;  Laterality: Left;   LOWER EXTREMITY ANGIOGRAPHY N/A 09/21/2018   Procedure: LOWER EXTREMITY ANGIOGRAPHY;  Surgeon: Nada Libman, MD;  Location: [facility] INVASIVE CV LAB;  Service: Cardiovascular;  Laterality: N/A;   ORIF SHOULDER FRACTURE Right    "it was crushed"   PATCH ANGIOPLASTY Left 05/04/2020   Procedure: BALLOON  ANGIOPLASTY;  Surgeon: Nada Libman, MD;  Location: [facility] OR;  Service: Vascular;  Laterality: Left;   PERIPHERAL VASCULAR CATHETERIZATION N/A 10/30/2015   Procedure: Abdominal Aortogram w/Lower Extremity;  Surgeon: Nada Libman, MD;  [facility]: [facility] INVASIVE CV LAB;  Service: Cardiovascular;  Laterality: N/A;   PERIPHERAL VASCULAR CATHETERIZATION  10/30/2015   Procedure: Peripheral Vascular Intervention;  Surgeon: Nada Libman, MD;  Location: [facility] INVASIVE CV LAB;  Service: Cardiovascular;;   PERIPHERAL VASCULAR CATHETERIZATION N/A 04/01/2016   Procedure: Abdominal Aortogram w/Lower Extremity;  Surgeon: Nada Libman, MD;  [facility]: [facility] INVASIVE CV LAB;  Service: Cardiovascular;  Laterality: N/A;   PERIPHERAL VASCULAR CATHETERIZATION Left 04/01/2016   Procedure: Peripheral Vascular Atherectomy;  Surgeon: Nada Libman, MD;  Location: [facility] INVASIVE CV LAB;  Service: Cardiovascular;  Laterality: Left;  Superficial femoral artery.   PERIPHERAL VASCULAR CATHETERIZATION Right 09/05/2016   "stent"   PERIPHERAL VASCULAR CATHETERIZATION N/A 09/05/2016   Procedure: Abdominal Aortogram w/Lower Extremity;  Surgeon:  Nada Libman, MD;  Location: [facility] INVASIVE CV LAB;  Service: Cardiovascular;  Laterality: N/A;   PERIPHERAL VASCULAR CATHETERIZATION Right 09/05/2016   Procedure: Peripheral Vascular Intervention;  Surgeon: Nada Libman, MD;  [facility]: [facility] INVASIVE CV LAB;  Service: Cardiovascular;  Laterality: Right;  Superficial Femoral   PERIPHERAL VASCULAR CATHETERIZATION Left 09/09/2016   Procedure: Lower Extremity Angiography;  Surgeon: Nada Libman, MD;  Location: [facility] INVASIVE CV LAB;  Service: Cardiovascular;  Laterality: Left;   PR VEIN BYPASS GRAFT,AORTO-FEM-POP  09-13-09   Left Fem-pop   THROMBECTOMY FEMORAL ARTERY Right 09/26/2016   Procedure: Thromboembolectomy Right Lower Extremity, Right Femoral Artery Endarterectomy with Patch Angioplasty; Right Lower Extremity Angiogram ;  Surgeon: Maeola Harman, MD;  Location: [facility] OR;  Service: Vascular;  Laterality: Right;   THROMBECTOMY FEMORAL ARTERY Left 12/27/2019   Procedure: Redo left femoral artery exposure, Thrombectomy of left deep femoral artery to anterior tibial artery bypass;  Surgeon: Edilia Bo,  Di Kindle, MD;  Location: [facility] OR;  Service: Vascular;  Laterality: Left;   THROMBECTOMY FEMORAL ARTERY Left 05/04/2020   Procedure: THROMBECTOMY LEFT LEG;  Surgeon: Nada Libman, MD;  Location: [facility] OR;  Service: Vascular;  Laterality: Left;   TOTAL ELBOW ARTHROPLASTY Left 09/03/2013   Procedure: LEFT TOTAL ELBOW ARTHROPLASTY;  Surgeon: Dominica Severin, MD;  Location: [facility] OR;  Service: Orthopedics;  Laterality: Left;   TOTAL KNEE ARTHROPLASTY Left 06/2006   TRANSMETATARSAL AMPUTATION Right 12/11/2016   Procedure: TRANSMETATARSAL AMPUTATION;  Surgeon: Nada Libman, MD;  Location: [facility] OR;  Service: Vascular;  Laterality: Right;   TUBAL LIGATION     VAGINAL HYSTERECTOMY       reports that she quit smoking about 76 years ago. Her smoking use included cigarettes. She has never been exposed to tobacco smoke. She has never used smokeless tobacco.  She reports that she does not drink alcohol and does not use drugs.  Allergies  Allergen Reactions   Motrin [Ibuprofen] Other (See Comments)    ADVERSE REACTION - GI BLEED   Statins Other (See Comments)    ADVERSE REACTION MUSCLE PAIN & WEAKNESS   Amitriptyline Other (See Comments)    Unknown reaction per daughter, unable to tolerate   Augmentin [Amoxicillin-Pot Clavulanate] Diarrhea   Doxycycline Diarrhea   Eliquis [Apixaban] Other (See Comments)    Lower GI bleeding   Ezetimibe Other (See Comments)   Lisinopril Cough   Lisinopril-Hydrochlorothiazide     Other reaction(s): felt "bad" on   Niacin And Related Other (See Comments)   Nitrofurantoin Other (See Comments)   Paxil [Paroxetine] Other (See Comments)   Morphine And Codeine Other (See Comments)    HALLUCINATIONS REACTION IS SIDE EFFECT   Promethazine Hcl Other (See Comments)    IV causes confusion, oral better   Sulfa Antibiotics Nausea And Vomiting    Family History  Problem Relation Age of Onset   Heart disease Father        Heart Disease before age 14   Hyperlipidemia Father    Hypertension Father    Alcohol abuse Father    Heart disease Brother    Hyperlipidemia Brother    Hypertension Brother    Deep vein thrombosis Brother    Heart disease Son        Heart Disease before age 81   Hyperlipidemia Son    Hypertension Son    Heart attack Son    Diabetes Son    Hypertension Son    Hyperlipidemia Sister    Hypertension Sister     Prior to Admission medications   Medication Sig Start Date End Date Taking? Authorizing Provider  acetaminophen (TYLENOL) 500 MG tablet Take 1,000 mg by mouth every 8 (eight) hours as needed for mild pain.    Yes [provider]  Alirocumab (PRALUENT) 150 MG/ML SOAJ Inject 1ml (150 mg total) into the skin every 14 days. Patient taking differently: Inject 150 mg into the skin every 14 (fourteen) days. 07/21/23  Yes Dyann Kief, PA-C  clopidogrel (PLAVIX) 75 MG tablet  Take 1 tablet (75 mg total) by mouth daily. 01/28/17  Yes Angiulli, Mcarthur Rossetti, PA-C  Cyanocobalamin (VITAMIN B-12 PO) Place 1 tablet under the tongue daily.   Yes [provider]  diazepam (VALIUM) 5 MG tablet Take 0.5 tablets (2.5 mg total) by mouth at bedtime as needed for anxiety. 01/28/17  Yes Angiulli, Mcarthur Rossetti, PA-C  furosemide (LASIX) 20 MG tablet Take 1 tablet on Monday- Wednesday -  Friday and 1/2 tablet on Sundays 10/29/22  Yes Dyann Kief, PA-C  gabapentin (NEURONTIN) 300 MG capsule Take 300 mg by mouth See admin instructions. Take 300mg  by mouth three times daily.  May take an additional 300mg  at bedtime as needed for pain.   Yes [provider]  isosorbide mononitrate (IMDUR) 30 MG 24 hr tablet TAKE ONE TABLET BY MOUTH EVERY EVENING 07/23/22  Yes Lyn Records, MD  levothyroxine (SYNTHROID) 50 MCG tablet Take 50 mcg by mouth every morning. 08/07/21  Yes [provider]  loperamide (IMODIUM) 2 MG capsule Take 2 mg by mouth See admin instructions. Take 2mg  by mouth as needed after each loose stool.  Do not exceed 8 capsules in 24 hours.   Yes [provider]  metoprolol tartrate (LOPRESSOR) 25 MG tablet Take 1 tablet (25 mg total) by mouth at bedtime. Patient taking differently: Take 25 mg by mouth 2 (two) times daily. 01/28/17  Yes Angiulli, Mcarthur Rossetti, PA-C  Multiple Vitamins-Minerals (IMMUNE SUPPORT VITAMIN C PO) Take 1 tablet by mouth 2 (two) times daily.    Yes [provider]  Multiple Vitamins-Minerals (PRESERVISION AREDS PO) Take 1 capsule by mouth in the morning and at bedtime.   Yes [provider]  Oxycodone HCl 10 MG TABS Take 5 mg by mouth 2 (two) times daily as needed (pain). 06/23/23  Yes [provider]  potassium chloride (KLOR-CON) 10 MEQ tablet Take 10 mEq by mouth every morning. 07/29/23  Yes [provider]  valsartan-hydrochlorothiazide (DIOVAN-HCT) 160-12.5 MG tablet Take 0.5 tablets by mouth daily.  01/28/17  Yes Angiulli, Mcarthur Rossetti, PA-C  VITAMIN D PO Take 1,000 Units by mouth daily.   Yes [provider]  diphenoxylate-atropine (LOMOTIL) 2.5-0.025 MG tablet Take 1 tablet by mouth as needed for diarrhea or loose stools. Patient not taking: Reported on 08/19/2023 06/05/21   [provider]    Physical Exam: Vitals:   08/19/23 1138 08/19/23 1145 08/19/23 1148 08/19/23 1321  BP: 124/64     Pulse:   66 66  Resp:   19 (!) 21  Temp: (!) 97.5 F (36.4 C)     SpO2:   100% 100%  Weight:  52.2 kg    Height:  5\' 2"  (1.575 m)      Constitutional: No acute distress Head: skin tear to right side of face, bruising around right eye Eyes: Conjunctiva clear ENM: Moist mucous membranes.   Respiratory: Clear to auscultation bilaterally, no wheezing/rales/rhonchi. Normal respiratory effort. No accessory muscle use. . Cardiovascular: Regular rate and rhythm. Soft systolic murmur Abdomen: Non-tender, non-distended. No masses. No rebound or guarding.   Skin: bruising of face, skin tear of face, bruising right stump which is intact, bruising left leg Neurologic: Alert, moving all 4 Psychiatric: Normal insight and judgement.   Labs on Admission: I have personally reviewed following labs and imaging studies  CBC: Recent Labs  Lab 08/19/23 1137  WBC 8.9  NEUTROABS 3.9  HGB 11.7*  HCT 36.5  MCV 96.6  PLT 209   Basic Metabolic Panel: Recent Labs  Lab 08/19/23 1344  NA 135  K 4.6  CL 102  CO2 24  GLUCOSE 127*  BUN 27*  CREATININE 1.24*  CALCIUM 8.9   GFR: Estimated Creatinine Clearance: 22.9 mL/min (A) (by C-G formula based on SCr of 1.24 mg/dL (H)). Liver Function Tests: No results for input(s): "AST", "ALT", "ALKPHOS", "BILITOT", "PROT", "ALBUMIN" in the last 168 hours. No results for input(s): "LIPASE", "AMYLASE"  in the last 168 hours. No results for input(s): "AMMONIA" in the last 168 hours. Coagulation Profile: No results for input(s): "INR", "PROTIME" in the  last 168 hours. Cardiac Enzymes: No results for input(s): "CKTOTAL", "CKMB", "CKMBINDEX", "TROPONINI" in the last 168 hours. BNP (last 3 results) Recent Labs    05/13/23 1603  PROBNP 331   HbA1C: No results for input(s): "HGBA1C" in the last 72 hours. CBG: No results for input(s): "GLUCAP" in the last 168 hours. Lipid Profile: No results for input(s): "CHOL", "HDL", "LDLCALC", "TRIG", "CHOLHDL", "LDLDIRECT" in the last 72 hours. Thyroid Function Tests: No results for input(s): "TSH", "T4TOTAL", "FREET4", "T3FREE", "THYROIDAB" in the last 72 hours. Anemia Panel: No results for input(s): "VITAMINB12", "FOLATE", "FERRITIN", "TIBC", "IRON", "RETICCTPCT" in the last 72 hours. Urine analysis:    Component Value Date/Time   COLORURINE YELLOW 08/19/2023 1313   APPEARANCEUR CLOUDY (A) 08/19/2023 1313   LABSPEC 1.009 08/19/2023 1313   PHURINE 5.0 08/19/2023 1313   GLUCOSEU NEGATIVE 08/19/2023 1313   HGBUR NEGATIVE 08/19/2023 1313   BILIRUBINUR NEGATIVE 08/19/2023 1313   KETONESUR NEGATIVE 08/19/2023 1313   PROTEINUR NEGATIVE 08/19/2023 1313   UROBILINOGEN 0.2 09/04/2013 0130   NITRITE POSITIVE (A) 08/19/2023 1313   LEUKOCYTESUR LARGE (A) 08/19/2023 1313    Radiological Exams on Admission: DG Foot 2 Views Left  Result Date: 08/19/2023 CLINICAL DATA:  Fall.  Left leg pain.  History of foot drop. EXAM: LEFT FOOT - 2 VIEW COMPARISON:  Left foot radiographs 12/23/2021 FINDINGS: There is diffuse decreased bone mineralization. There is again mild high-grade pes cavus with mild metatarsus adductus and medial subluxation of the navicular with respect to the talus. There is again mild high-grade subchondral sclerosis within the distal talus. There is again high-grade extensor positioning of the great toe metatarsophalangeal joint on all images. Moderate interphalangeal joint space narrowing diffusely. Within the limitations of diffuse decreased bone mineralization, no definite acute fracture is  seen. IMPRESSION: 1. Within the limitations of diffuse decreased bone mineralization, no definite acute fracture is seen. 2. There is again mild high-grade pes cavus with mild metatarsus adductus and medial subluxation of the navicular with respect to the talus. 3. There is again high-grade extensor positioning of the great toe metatarsophalangeal joint on all images. Electronically Signed   By: Neita Garnet M.D.   On: 08/19/2023 15:00   CT Cervical Spine Wo Contrast  Result Date: 08/19/2023 CLINICAL DATA:  Larey Seat with trauma to the head and face EXAM: CT CERVICAL SPINE WITHOUT CONTRAST TECHNIQUE: Multidetector CT imaging of the cervical spine was performed without intravenous contrast. Multiplanar CT image reconstructions were also generated. RADIATION DOSE REDUCTION: This exam was performed according to the departmental dose-optimization program which includes automated exposure control, adjustment of the mA and/or kV according to patient size and/or use of iterative reconstruction technique. COMPARISON:  None Available. FINDINGS: Alignment: Exaggerated cervical lordosis and thoracic kyphosis. Skull base and vertebrae: No regional fracture Soft tissues and spinal canal: No traumatic soft tissue finding. Disc levels: Chronic arthropathy at the C1-2 articulation. Question CPPD. Chronic degenerative spondylosis C3-4 through C7-T1. Facet osteoarthritis most pronounced on the left at C3-4, C6-7 and C7-T1. Upper chest: No acute upper thoracic fracture. Solid bridging anterior osteophytes T5 through T7. Other: None IMPRESSION: No acute or traumatic finding. Chronic degenerative spondylosis and facet osteoarthritis. Electronically Signed   By: Paulina Fusi M.D.   On: 08/19/2023 14:13   CT Maxillofacial Wo Contrast  Result Date: 08/19/2023 CLINICAL DATA:  Fall with trauma to  the face and neck EXAM: CT MAXILLOFACIAL WITHOUT CONTRAST TECHNIQUE: Multidetector CT imaging of the maxillofacial structures was performed.  Multiplanar CT image reconstructions were also generated. RADIATION DOSE REDUCTION: This exam was performed according to the departmental dose-optimization program which includes automated exposure control, adjustment of the mA and/or kV according to patient size and/or use of iterative reconstruction technique. COMPARISON:  12/23/2021 FINDINGS: Osseous: No acute facial fracture. Chronic mucoperiosteal thickening of the left maxillary sinus. Orbits: Normal. No intraorbital injury. Lateral periorbital soft tissue swelling on the right with soft tissue hematoma. Sinuses: Chronic opacification of the left maxillary sinus with mucoperiosteal thickening in calcified internal material. Soft tissues: Otherwise negative. Limited intracranial: No traumatic finding. IMPRESSION: 1. No acute facial fracture. Lateral periorbital soft tissue swelling on the right with soft tissue hematoma. 2. Chronic opacification of the left maxillary sinus with mucoperiosteal thickening and calcified internal material. Electronically Signed   By: Paulina Fusi M.D.   On: 08/19/2023 14:10   CT Head Wo Contrast  Result Date: 08/19/2023 CLINICAL DATA:  Larey Seat to the floor with trauma to the head EXAM: CT HEAD WITHOUT CONTRAST TECHNIQUE: Contiguous axial images were obtained from the base of the skull through the vertex without intravenous contrast. RADIATION DOSE REDUCTION: This exam was performed according to the departmental dose-optimization program which includes automated exposure control, adjustment of the mA and/or kV according to patient size and/or use of iterative reconstruction technique. COMPARISON:  12/23/2021 FINDINGS: Brain: Age related volume loss. Chronic small-vessel ischemic change of the white matter. No sign of acute infarction, mass lesion, hemorrhage, hydrocephalus or extra-axial collection. Vascular: There is atherosclerotic calcification of the major vessels at the base of the brain. Skull: Negative.  No skull fracture.  Sinuses/Orbits: See results of maxillofacial CT. Other: Right forehead soft tissue hematoma. IMPRESSION: 1. No acute intracranial finding. Age related volume loss. Chronic small-vessel ischemic change of the white matter. 2. Right forehead soft tissue hematoma. Electronically Signed   By: Paulina Fusi M.D.   On: 08/19/2023 14:08   DG Knee Right Port  Result Date: 08/19/2023 CLINICAL DATA:  Fall. EXAM: PORTABLE RIGHT KNEE - 1-2 VIEW COMPARISON:  Right tibia/fibula radiographs dated 07/08/2018. FINDINGS: Diffuse osseous demineralization. Status post right below-the-knee amputation. The proximal tibial and fibular resection margins appear intact. No acute fracture. The knee joint is anatomically aligned with degenerative changes. IMPRESSION: No acute osseous abnormality. Status post right below-the-knee amputation. Electronically Signed   By: Hart Robinsons M.D.   On: 08/19/2023 13:36   DG Pelvis Portable  Result Date: 08/19/2023 CLINICAL DATA:  Fall. EXAM: PORTABLE PELVIS 1-2 VIEWS COMPARISON:  Hip radiographs dated June 21, 2019. FINDINGS: There is no evidence of pelvic fracture or diastasis. The hip joints are anatomically aligned with left-greater-than-right degenerative changes. Vascular stent grafts are noted. IMPRESSION: No acute fracture on single AP pelvic radiograph. Electronically Signed   By: Hart Robinsons M.D.   On: 08/19/2023 13:34   DG Chest Portable 1 View  Result Date: 08/19/2023 CLINICAL DATA:  Fall. EXAM: PORTABLE CHEST 1 VIEW COMPARISON:  Chest radiograph dated December 30, 2021. FINDINGS: The heart size and mediastinal contours are within normal limits. Aortic arch calcification. Similar chronic interstitial parenchymal changes. No focal consolidation, pneumothorax, or sizable pleural effusion. No acute osseous abnormality. Status post right shoulder arthroplasty. IMPRESSION: No acute findings in the chest. Electronically Signed   By: Hart Robinsons M.D.   On: 08/19/2023 13:32    DG Femur Min 2 Views Left  Result Date: 08/19/2023  CLINICAL DATA:  Fall.  Pain. EXAM: LEFT FEMUR 2 VIEWS COMPARISON:  Hip radiographs dated 07/08/2018. FINDINGS: Minimally displaced oblique fracture at the posteromedial cortex of the distal left femur. No additional fracture identified. Intact left total knee arthroplasty. The left hip joint is anatomically aligned with severe joint space narrowing and marginal osteophytosis. Vascular stents and skin staples are noted. IMPRESSION: 1. Minimally displaced oblique fracture at the posteromedial cortex of the distal left femur. No additional fracture identified. 2. Severe left hip osteoarthritis. Electronically Signed   By: Hart Robinsons M.D.   On: 08/19/2023 13:29   DG Knee Left Port  Result Date: 08/19/2023 CLINICAL DATA:  Fall.  Bilateral knee pain. EXAM: PORTABLE LEFT KNEE - 1-2 VIEW COMPARISON:  None Available. FINDINGS: Diffusely decreased osseous mineralization. There is a minimally displaced oblique fracture at the posteromedial distal left femoral metadiaphyseal cortex. No additional fracture identified. Lipohemarthrosis is noted. Status post left total knee arthroplasty in anatomic alignment. Vascular stents and skin staples are noted. There is soft tissue swelling about the knee and distal femur. IMPRESSION: 1. Minimally displaced oblique fracture at the posteromedial cortex of the distal left femur. 2. Lipohemarthrosis, concerning for possible additional occult fracture. 3. Intact left total knee arthroplasty in anatomic alignment. Electronically Signed   By: Hart Robinsons M.D.   On: 08/19/2023 13:22    EKG: Independently reviewed. sinus  Assessment/Plan Principal Problem:   Femur fracture, left (HCC) Active Problems:   PAD (peripheral artery disease) (HCC)   Anemia   S/P unilateral BKA (below knee amputation), right (HCC)   PAF (paroxysmal atrial fibrillation) (HCC)   Stage 3 chronic kidney disease (HCC)   Benign essential HTN    History of CVA (cerebrovascular accident)   History of GI bleed   # Femur fracture, distal, left After mechanical fall. Ortho recs pending but given severe non-salvageable PAD in left leg and bedbound status at baseline, suspect recs will be non-operative - will make npo after midnight for time being and hold home plavix - f/u ortho recs - pain control, bowel regimen  # Fall With skin tear to right face. Ct head/neck/face negative for acute fracture, imaging negative for hip fracture  # PAD # s/p Right BKA Has severe PAD of left leg, occluded stent. Followed by vascular as outpt, told nothing more can be done for left leg, but for the time being blood flow has been sufficient to avoid amputation. - on praluent at home - plavix on hold pending ortho recs  # HFpEF Euvolemic - home metop, imdur - holding lasix for the time being  # GAD - home valium prn  # Hypothyroid - home levothyroxine  # Chronic pain - oxy as above; home gabapentin  # HTN Here bp low normal  - holding home valsart/hydrochlorothiazide for now  # Hx CVA - praluent and plavix as above  # Bacteriuria Urinalysis suggestive of bacteriuria but patient is asymptomatic - holding on further w/u or treatment given lack of symptoms  # CKD 3b Kidney function is at baseline - monitor   DVT prophylaxis: lovenox Code Status: DNR, confirmed with patient and daughter at bedside  Family Communication: daughter at bedside  Consults called: ortho   Level of care: Telemetry Medical Status is: obs    Silvano Bilis MD [facility] Pager [phone]  If 7PM-7AM, please contact night-coverage [email or web address] Password [facility]  08/19/2023, 3:40 PM

## 2023-08-20 DIAGNOSIS — I5032 Chronic diastolic (congestive) heart failure: Secondary | ICD-10-CM | POA: Diagnosis present

## 2023-08-20 DIAGNOSIS — Y92002 Bathroom of unspecified non-institutional (private) residence single-family (private) house as the place of occurrence of the external cause: Secondary | ICD-10-CM | POA: Diagnosis not present

## 2023-08-20 DIAGNOSIS — Z79890 Hormone replacement therapy (postmenopausal): Secondary | ICD-10-CM | POA: Diagnosis not present

## 2023-08-20 DIAGNOSIS — Z89511 Acquired absence of right leg below knee: Secondary | ICD-10-CM | POA: Diagnosis not present

## 2023-08-20 DIAGNOSIS — T82856A Stenosis of peripheral vascular stent, initial encounter: Secondary | ICD-10-CM | POA: Diagnosis present

## 2023-08-20 DIAGNOSIS — I739 Peripheral vascular disease, unspecified: Secondary | ICD-10-CM | POA: Diagnosis not present

## 2023-08-20 DIAGNOSIS — S0181XA Laceration without foreign body of other part of head, initial encounter: Secondary | ICD-10-CM | POA: Diagnosis present

## 2023-08-20 DIAGNOSIS — K5903 Drug induced constipation: Secondary | ICD-10-CM | POA: Diagnosis not present

## 2023-08-20 DIAGNOSIS — S79929A Unspecified injury of unspecified thigh, initial encounter: Secondary | ICD-10-CM | POA: Diagnosis not present

## 2023-08-20 DIAGNOSIS — I480 Paroxysmal atrial fibrillation: Secondary | ICD-10-CM | POA: Diagnosis present

## 2023-08-20 DIAGNOSIS — Y712 Prosthetic and other implants, materials and accessory cardiovascular devices associated with adverse incidents: Secondary | ICD-10-CM | POA: Diagnosis present

## 2023-08-20 DIAGNOSIS — E7800 Pure hypercholesterolemia, unspecified: Secondary | ICD-10-CM | POA: Diagnosis present

## 2023-08-20 DIAGNOSIS — M797 Fibromyalgia: Secondary | ICD-10-CM | POA: Diagnosis present

## 2023-08-20 DIAGNOSIS — T402X5A Adverse effect of other opioids, initial encounter: Secondary | ICD-10-CM | POA: Diagnosis not present

## 2023-08-20 DIAGNOSIS — E871 Hypo-osmolality and hyponatremia: Secondary | ICD-10-CM | POA: Diagnosis present

## 2023-08-20 DIAGNOSIS — Z7401 Bed confinement status: Secondary | ICD-10-CM | POA: Diagnosis not present

## 2023-08-20 DIAGNOSIS — N1832 Chronic kidney disease, stage 3b: Secondary | ICD-10-CM | POA: Diagnosis present

## 2023-08-20 DIAGNOSIS — D631 Anemia in chronic kidney disease: Secondary | ICD-10-CM | POA: Diagnosis present

## 2023-08-20 DIAGNOSIS — E039 Hypothyroidism, unspecified: Secondary | ICD-10-CM | POA: Diagnosis present

## 2023-08-20 DIAGNOSIS — Z66 Do not resuscitate: Secondary | ICD-10-CM | POA: Diagnosis present

## 2023-08-20 DIAGNOSIS — I70203 Unspecified atherosclerosis of native arteries of extremities, bilateral legs: Secondary | ICD-10-CM | POA: Diagnosis present

## 2023-08-20 DIAGNOSIS — Z7189 Other specified counseling: Secondary | ICD-10-CM | POA: Diagnosis not present

## 2023-08-20 DIAGNOSIS — S7292XA Unspecified fracture of left femur, initial encounter for closed fracture: Secondary | ICD-10-CM | POA: Diagnosis not present

## 2023-08-20 DIAGNOSIS — Z515 Encounter for palliative care: Secondary | ICD-10-CM | POA: Diagnosis not present

## 2023-08-20 DIAGNOSIS — Z8719 Personal history of other diseases of the digestive system: Secondary | ICD-10-CM

## 2023-08-20 DIAGNOSIS — N179 Acute kidney failure, unspecified: Secondary | ICD-10-CM | POA: Diagnosis not present

## 2023-08-20 DIAGNOSIS — F411 Generalized anxiety disorder: Secondary | ICD-10-CM | POA: Diagnosis present

## 2023-08-20 DIAGNOSIS — B961 Klebsiella pneumoniae [K. pneumoniae] as the cause of diseases classified elsewhere: Secondary | ICD-10-CM | POA: Diagnosis present

## 2023-08-20 DIAGNOSIS — W050XXA Fall from non-moving wheelchair, initial encounter: Secondary | ICD-10-CM | POA: Diagnosis present

## 2023-08-20 DIAGNOSIS — Z79899 Other long term (current) drug therapy: Secondary | ICD-10-CM | POA: Diagnosis not present

## 2023-08-20 DIAGNOSIS — Z8673 Personal history of transient ischemic attack (TIA), and cerebral infarction without residual deficits: Secondary | ICD-10-CM

## 2023-08-20 DIAGNOSIS — S728X2A Other fracture of left femur, initial encounter for closed fracture: Secondary | ICD-10-CM | POA: Diagnosis not present

## 2023-08-20 DIAGNOSIS — G8929 Other chronic pain: Secondary | ICD-10-CM | POA: Diagnosis present

## 2023-08-20 DIAGNOSIS — M79605 Pain in left leg: Secondary | ICD-10-CM | POA: Diagnosis present

## 2023-08-20 DIAGNOSIS — I10 Essential (primary) hypertension: Secondary | ICD-10-CM | POA: Diagnosis not present

## 2023-08-20 DIAGNOSIS — N390 Urinary tract infection, site not specified: Secondary | ICD-10-CM | POA: Diagnosis present

## 2023-08-20 DIAGNOSIS — I130 Hypertensive heart and chronic kidney disease with heart failure and stage 1 through stage 4 chronic kidney disease, or unspecified chronic kidney disease: Secondary | ICD-10-CM | POA: Diagnosis present

## 2023-08-20 DIAGNOSIS — M9712XA Periprosthetic fracture around internal prosthetic left knee joint, initial encounter: Secondary | ICD-10-CM | POA: Diagnosis present

## 2023-08-20 DIAGNOSIS — H353230 Exudative age-related macular degeneration, bilateral, stage unspecified: Secondary | ICD-10-CM | POA: Diagnosis present

## 2023-08-20 DIAGNOSIS — S72402A Unspecified fracture of lower end of left femur, initial encounter for closed fracture: Secondary | ICD-10-CM | POA: Diagnosis present

## 2023-08-20 LAB — BASIC METABOLIC PANEL
Anion gap: 8 (ref 5–15)
BUN: 26 mg/dL — ABNORMAL HIGH (ref 8–23)
CO2: 25 mmol/L (ref 22–32)
Calcium: 8.7 mg/dL — ABNORMAL LOW (ref 8.9–10.3)
Chloride: 101 mmol/L (ref 98–111)
Creatinine, Ser: 1.4 mg/dL — ABNORMAL HIGH (ref 0.44–1.00)
GFR, Estimated: 35 mL/min — ABNORMAL LOW (ref >60)
Glucose, Bld: 127 mg/dL — ABNORMAL HIGH (ref 70–99)
Potassium: 4.6 mmol/L (ref 3.5–5.1)
Sodium: 134 mmol/L — ABNORMAL LOW (ref 135–145)

## 2023-08-20 LAB — CBC
HCT: 30.7 % — ABNORMAL LOW (ref 36.0–46.0)
Hemoglobin: 9.9 g/dL — ABNORMAL LOW (ref 12.0–15.0)
MCH: 30.7 pg (ref 26.0–34.0)
MCHC: 32.2 g/dL (ref 30.0–36.0)
MCV: 95.3 fL (ref 80.0–100.0)
Platelets: 218 10*3/uL (ref 150–400)
RBC: 3.22 MIL/uL — ABNORMAL LOW (ref 3.87–5.11)
RDW: 13.8 % (ref 11.5–15.5)
WBC: 11.1 10*3/uL — ABNORMAL HIGH (ref 4.0–10.5)
nRBC: 0 % (ref 0.0–0.2)

## 2023-08-20 MED ORDER — METOPROLOL TARTRATE 12.5 MG HALF TABLET
12.5000 mg | ORAL_TABLET | Freq: Two times a day (BID) | ORAL | Status: DC
  Administered 2023-08-20 – 2023-08-21 (×3): 12.5 mg via ORAL
  Filled 2023-08-20 (×3): qty 1

## 2023-08-20 MED ORDER — GABAPENTIN 100 MG PO CAPS: 200.0000 mg | ORAL_CAPSULE | Freq: Every evening | ORAL | Status: DC | PRN

## 2023-08-20 MED ORDER — GABAPENTIN 100 MG PO CAPS
200.0000 mg | ORAL_CAPSULE | Freq: Three times a day (TID) | ORAL | Status: DC
  Administered 2023-08-20 – 2023-08-23 (×10): 200 mg via ORAL
  Filled 2023-08-20 (×10): qty 2

## 2023-08-20 MED ORDER — CLOPIDOGREL BISULFATE 75 MG PO TABS
75.0000 mg | ORAL_TABLET | Freq: Every day | ORAL | Status: DC
  Administered 2023-08-20 – 2023-08-23 (×4): 75 mg via ORAL
  Filled 2023-08-20 (×4): qty 1

## 2023-08-20 MED ORDER — ACETAMINOPHEN 325 MG PO TABS
650.0000 mg | ORAL_TABLET | Freq: Four times a day (QID) | ORAL | Status: DC
  Administered 2023-08-20 – 2023-08-21 (×3): 650 mg via ORAL
  Filled 2023-08-20 (×4): qty 2

## 2023-08-20 MED ORDER — SODIUM CHLORIDE 0.9 % IV BOLUS
250.0000 mL | Freq: Once | INTRAVENOUS | Status: AC
  Administered 2023-08-20: 250 mL via INTRAVENOUS

## 2023-08-20 NOTE — Plan of Care (Signed)
  Problem: Education: °Goal: Knowledge of General Education information will improve °Description: Including pain rating scale, medication(s)/side effects and non-pharmacologic comfort measures °Outcome: Progressing °  °Problem: Health Behavior/Discharge Planning: °Goal: Ability to manage health-related needs will improve °Outcome: Progressing °  °Problem: Clinical Measurements: °Goal: Ability to maintain clinical measurements within normal limits will improve °Outcome: Progressing °Goal: Will remain free from infection °Outcome: Progressing °Goal: Diagnostic test results will improve °Outcome: Progressing °Goal: Respiratory complications will improve °Outcome: Progressing °Goal: Cardiovascular complication will be avoided °Outcome: Progressing °  °Problem: Pain Managment: °Goal: General experience of comfort will improve °Outcome: Progressing °  °Problem: Skin Integrity: °Goal: Risk for impaired skin integrity will decrease °Outcome: Progressing °  °

## 2023-08-20 NOTE — TOC CAGE-AID Note (Signed)
Transition of Care [facility]) - CAGE-AID Screening  Patient Details  Name: [name] MRN: [id number] Date of Birth: [birth year]  Clinical Narrative:  Patient denies any alcohol or drug use, denies need for substance abuse resources at this time.  CAGE-AID Screening:   Have You Ever Felt You Ought to Cut Down on Your Drinking or Drug Use?: No Have People Annoyed You By Critizing Your Drinking Or Drug Use?: No Have You Felt Bad Or Guilty About Your Drinking Or Drug Use?: No Have You Ever Had a Drink or Used Drugs First Thing In The Morning to Steady Your Nerves or to Get Rid of a Hangover?: No CAGE-AID Score: 0  Substance Abuse Education Offered: No

## 2023-08-20 NOTE — TOC Initial Note (Signed)
Transition of Care [facility]) - Initial/Assessment Note    Patient Details  Name: [name] MRN: [id number] Date of Birth: [birth year]  Transition of Care [facility]) CM/SW Contact:    Lorri Frederick, LCSW Phone Number: 08/20/2023, 2:38 PM  Clinical Narrative:     CSW met with pt and son Annette Stable for initial assessment.  Son confirms pt is at [facility] in the SNF.  Wheelchair at baseline.  Plan will be to return when appropriate.    TOC will continue to follow.               Expected Discharge Plan: Skilled Nursing Facility Barriers to Discharge: Continued Medical Work up   Patient Goals and CMS Choice            Expected Discharge Plan and Services In-house Referral: Clinical Social Work   Post Acute Care Choice: Resumption of Paediatric nurse (from [facility]) Living arrangements for the past 2 months: Skilled Nursing Facility Financial risk analyst)                                      Prior Living Arrangements/Services Living arrangements for the past 2 months: Skilled Nursing Facility Financial risk analyst) Lives with:: Facility Resident Patient language and need for interpreter reviewed:: Yes        Need for Family Participation in Patient Care: Yes (Comment) Care giver support system in place?: Yes (comment) Current home services: Other (comment) (na) Criminal Activity/Legal Involvement Pertinent to Current Situation/Hospitalization: No - Comment as needed  Activities of Daily Living      Permission Sought/Granted                  Emotional Assessment Appearance:: Appears stated age Attitude/Demeanor/Rapport: Engaged Affect (typically observed): Pleasant Orientation: : Oriented to Self, Oriented to Place, Oriented to  Time, Oriented to Situation      Admission diagnosis:  Lower urinary tract infectious disease [N39.0] Femur fracture, left (HCC) [S72.92XA] Periprosthetic fracture around internal prosthetic left knee joint, initial encounter  [M97.12XA] Laceration of skin of face, initial encounter [S01.81XA] Fall in home, initial encounter [W19.XXXA, Y92.009] Closed fracture of left femur, unspecified fracture morphology, unspecified portion of femur, initial encounter (HCC) [S72.92XA] Patient Active Problem List   Diagnosis Date Noted   History of GI bleed 08/19/2023   Femur fracture, left (HCC) 08/19/2023   Chest pain of uncertain etiology 08/14/2021   History of CVA (cerebrovascular accident) 08/14/2021   Thrombosis 05/04/2020   Secondary hypercoagulable state (HCC) 01/09/2020   Critical lower limb ischemia (HCC) 12/28/2019   Plantar fasciitis of left foot 06/10/2017   Idiopathic chronic venous hypertension of both lower extremities with inflammation 05/21/2017   Foot drop, left 05/21/2017   Decubitus ulcer of sacral region, unstageable (HCC)    Deep tissue injury    Hypokalemia    Hypoalbuminemia due to protein-calorie malnutrition (HCC)    Debilitated 01/21/2017   Neuropathic pain    Post-operative pain    Slow transit constipation    Debility    Ischemic leg    PAF (paroxysmal atrial fibrillation) (HCC)    Anemia of chronic disease    Chronic pain syndrome    Fibromyalgia    Stage 3 chronic kidney disease (HCC)    Benign essential HTN    Abnormal urinalysis    PVD (peripheral vascular disease) (HCC) 01/13/2017   Post-op pain 01/03/2017   Phantom limb pain (HCC) 01/03/2017  S/P unilateral BKA (below knee amputation), right (HCC) 12/30/2016   Acute on chronic combined systolic and diastolic CHF (congestive heart failure) (HCC) 12/22/2016   Anemia 12/22/2016   Palliative care encounter    Lower extremity pain, right    Acute GI bleeding 10/23/2016   Paroxysmal atrial fibrillation (HCC) 10/03/2016   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) 09/24/2016   PAD (peripheral artery disease) (HCC) 09/05/2016   Groin pain 04/02/2015   Aftercare following surgery  of the circulatory system, NEC 12/13/2013   Shingles 10/26/2013   Anxiety 10/26/2013   Acute blood loss anemia 09/05/2013   Elbow fracture, left 09/05/2013   Left acetabular fracture (HCC) 09/05/2013   Ankle fracture, left 09/05/2013   HTN (hypertension) 09/03/2013   HLD (hyperlipidemia) 09/03/2013   Peripheral vascular disease, unspecified (HCC) 05/10/2012   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 [place] [address] Phone: [phone] Fax: [phone]  [facility] [address] [place] - [address] [place] [address] Phone: [phone] Fax: [phone]  [facility] - [address] [place] - [address] [place] [address] Phone: [phone] Fax: [phone]     Social Determinants of Health (SDOH) Social History: SDOH Screenings   Tobacco Use: Medium Risk (07/06/2023)   SDOH Interventions:     Readmission Risk Interventions     No data to display

## 2023-08-20 NOTE — Progress Notes (Signed)
PROGRESS NOTE  [name]:[id number] DOB: 11-Feb-1928   PCP: Marden Noble, MD  Patient is from: Home but lives at LTC.  Wheelchair dependent.  Uses board for transfer.  DOA: 08/19/2023 LOS: 1  Chief complaints Chief Complaint  Patient presents with   Fall     Brief Narrative / Interim history: 89 year old F with PMH of A-fib not on AC due to GIB, HFpEF, HTN, bilateral PAD s/p right BKA, ambulatory dysfunction with wheelchair dependence brought to ED after she had accidental fall transferring from wheelchair to commode at the daughter's home, and found to have left distal femoral fracture.  Patient normally resides at SNF/LTC went to visit her daughter.  Orthopedic surgery consulted and recommended conservative management given severe left PAD.     Subjective: Seen and examined earlier this morning.  No major events overnight of this morning.  Reports left leg pain.  She rates her pain 5/10 at the moment.  Pain is severe and 10/10 with minimal movement.  She has brace.  Denies headache or focal neurodeficit.  Patient's daughter at bedside.  Objective: Vitals:   08/19/23 1648 08/19/23 2028 08/20/23 0441 08/20/23 0850  BP: (!) 173/64 (!) 155/58 (!) 103/44 (!) 99/55  Pulse: 68 83 72 77  Resp: 18 17 17 16   Temp: 97.9 F (36.6 C) (!) 97.5 F (36.4 C) (!) 97.5 F (36.4 C) 97.6 F (36.4 C)  TempSrc:  Oral Oral Oral  SpO2: 98% 99% 100% 99%  Weight:      Height:        Examination:  GENERAL: Appears frail. HEENT: MMM.  Vision and hearing grossly intact.  Bruising around right eye NECK: Supple.  No apparent JVD.  RESP:  No IWOB.  Fair aeration bilaterally. CVS:  RRR. Heart sounds normal.  ABD/GI/GU: BS+. Abd soft, NTND.  MSK/EXT: Right BKA.  Left leg in brace. SKIN: Bruising around right eye NEURO: Awake.  Fairly oriented.  No apparent focal neuro deficit. PSYCH: Calm. Normal affect.   Procedures:  None  Microbiology summarized: Urine culture  pending  Assessment and plan: Principal Problem:   Femur fracture, left (HCC) Active Problems:   PAD (peripheral artery disease) (HCC)   Anemia   S/P unilateral BKA (below knee amputation), right (HCC)   PAF (paroxysmal atrial fibrillation) (HCC)   Stage 3 chronic kidney disease (HCC)   Benign essential HTN   History of CVA (cerebrovascular accident)   History of GI bleed  Accidental fall at home: Patient fell during transfer from wheelchair to bedside commode at daughter's home Closed distal fracture of left femur due to mechanical fall -Ortho recommended conservative management given severe non-salvageable PAD in left leg and bedbound status -Pain control-scheduled Tylenol with as needed oxycodone and Dilaudid -Palliative consulted for help with symptom management and further goals of care discussion.  She will at least benefit from palliative follow-up outpatient. Discussed this with patient's daughter.  -Hold off therapy consultation.  I think the focus should be more of quality and symptom management  Severe bilateral PAD s/p right BKA: Has severe PAD of left leg, occluded stent. Followed by vascular as outpt, told nothing more can be done for left leg, but for the time being blood flow has been sufficient to avoid amputation. -On praluent at home -Resume home Plavix   Chronic HFpEF: Euvolemic. -Continue home metop, imdur -Continue holding lasix for the time being to allow renal recovery  Generalized anxiety/chronic pain -Continue home meds.  Reduce gabapentin based on renal  function -Pain meds as above.  Essential hypertension: Soft BP this morning.  Likely from pain meds. -Continue holding home Diovan HCTZ and Lasix -Hold Imdur.  Received morning dose. -Decrease metoprolol to 12.5 mg twice daily.   History of CVA: Stable -praluent and plavix as above  Hypothyroidism -home levothyroxine  Bacteriuria/pyuria: Patient denies UTI symptoms. -Follow urine culture but  doubt significance   CKD-3B: Stable -Monitor -Dose medications based on renal function  Body mass index is 21.03 kg/m.   DVT prophylaxis:  enoxaparin (LOVENOX) injection 30 mg Start: 08/20/23 1000  Code Status: DNR/DNI Family Communication: Updated patient's daughter at bedside Level of care: Telemetry Medical Status is: Inpatient Remains inpatient appropriate because: Significant pain due to left femoral fracture   Final disposition: SNF Consultants:  Orthopedic surgery Palliative medicine  55 minutes with more than 50% spent in reviewing records, counseling patient/family and coordinating care.   Sch Meds:  Scheduled Meds:  acetaminophen  650 mg Oral Q6H WA   clopidogrel  75 mg Oral Daily   enoxaparin (LOVENOX) injection  30 mg Subcutaneous Q24H   gabapentin  200 mg Oral TID   levothyroxine  50 mcg Oral Q0600   metoprolol tartrate  12.5 mg Oral BID   polyethylene glycol  17 g Oral Daily   sodium chloride flush  3 mL Intravenous Q12H   Continuous Infusions:  sodium chloride     PRN Meds:.sodium chloride, diazepam, HYDROmorphone (DILAUDID) injection, ondansetron (ZOFRAN) IV, oxyCODONE, sodium chloride flush  Antimicrobials: Anti-infectives (From admission, onward)    Start     Dose/Rate Route Frequency Ordered Stop   08/19/23 1500  cefTRIAXone (ROCEPHIN) 1 g in sodium chloride 0.9 % 100 mL IVPB  Status:  Discontinued        1 g 200 mL/hr over 30 Minutes Intravenous  Once 08/19/23 1457 08/19/23 1620        I have personally reviewed the following labs and images: CBC: Recent Labs  Lab 08/19/23 1137 08/20/23 0517  WBC 8.9 11.1*  NEUTROABS 3.9  --   HGB 11.7* 9.9*  HCT 36.5 30.7*  MCV 96.6 95.3  PLT 209 218   BMP &GFR Recent Labs  Lab 08/19/23 1344 08/20/23 0517  NA 135 134*  K 4.6 4.6  CL 102 101  CO2 24 25  GLUCOSE 127* 127*  BUN 27* 26*  CREATININE 1.24* 1.40*  CALCIUM 8.9 8.7*   Estimated Creatinine Clearance: 20.3 mL/min (A) (by C-G  formula based on SCr of 1.4 mg/dL (H)). Liver & Pancreas: No results for input(s): "AST", "ALT", "ALKPHOS", "BILITOT", "PROT", "ALBUMIN" in the last 168 hours. No results for input(s): "LIPASE", "AMYLASE" in the last 168 hours. No results for input(s): "AMMONIA" in the last 168 hours. Diabetic: No results for input(s): "HGBA1C" in the last 72 hours. No results for input(s): "GLUCAP" in the last 168 hours. Cardiac Enzymes: No results for input(s): "CKTOTAL", "CKMB", "CKMBINDEX", "TROPONINI" in the last 168 hours. Recent Labs    05/13/23 1603  PROBNP 331   Coagulation Profile: No results for input(s): "INR", "PROTIME" in the last 168 hours. Thyroid Function Tests: No results for input(s): "TSH", "T4TOTAL", "FREET4", "T3FREE", "THYROIDAB" in the last 72 hours. Lipid Profile: No results for input(s): "CHOL", "HDL", "LDLCALC", "TRIG", "CHOLHDL", "LDLDIRECT" in the last 72 hours. Anemia Panel: No results for input(s): "VITAMINB12", "FOLATE", "FERRITIN", "TIBC", "IRON", "RETICCTPCT" in the last 72 hours. Urine analysis:    Component Value Date/Time   COLORURINE YELLOW 08/19/2023 1313   APPEARANCEUR CLOUDY (A)  08/19/2023 1313   LABSPEC 1.009 08/19/2023 1313   PHURINE 5.0 08/19/2023 1313   GLUCOSEU NEGATIVE 08/19/2023 1313   HGBUR NEGATIVE 08/19/2023 1313   BILIRUBINUR NEGATIVE 08/19/2023 1313   KETONESUR NEGATIVE 08/19/2023 1313   PROTEINUR NEGATIVE 08/19/2023 1313   UROBILINOGEN 0.2 09/04/2013 0130   NITRITE POSITIVE (A) 08/19/2023 1313   LEUKOCYTESUR LARGE (A) 08/19/2023 1313   Sepsis Labs: Invalid input(s): "PROCALCITONIN", "LACTICIDVEN"  Microbiology: No results found for this or any previous visit (from the past 240 hour(s)).  Radiology Studies: DG Foot 2 Views Left  Result Date: 08/19/2023 CLINICAL DATA:  Fall.  Left leg pain.  History of foot drop. EXAM: LEFT FOOT - 2 VIEW COMPARISON:  Left foot radiographs 12/23/2021 FINDINGS: There is diffuse decreased bone  mineralization. There is again mild high-grade pes cavus with mild metatarsus adductus and medial subluxation of the navicular with respect to the talus. There is again mild high-grade subchondral sclerosis within the distal talus. There is again high-grade extensor positioning of the great toe metatarsophalangeal joint on all images. Moderate interphalangeal joint space narrowing diffusely. Within the limitations of diffuse decreased bone mineralization, no definite acute fracture is seen. IMPRESSION: 1. Within the limitations of diffuse decreased bone mineralization, no definite acute fracture is seen. 2. There is again mild high-grade pes cavus with mild metatarsus adductus and medial subluxation of the navicular with respect to the talus. 3. There is again high-grade extensor positioning of the great toe metatarsophalangeal joint on all images. Electronically Signed   By: Neita Garnet M.D.   On: 08/19/2023 15:00   CT Cervical Spine Wo Contrast  Result Date: 08/19/2023 CLINICAL DATA:  Larey Seat with trauma to the head and face EXAM: CT CERVICAL SPINE WITHOUT CONTRAST TECHNIQUE: Multidetector CT imaging of the cervical spine was performed without intravenous contrast. Multiplanar CT image reconstructions were also generated. RADIATION DOSE REDUCTION: This exam was performed according to the departmental dose-optimization program which includes automated exposure control, adjustment of the mA and/or kV according to patient size and/or use of iterative reconstruction technique. COMPARISON:  None Available. FINDINGS: Alignment: Exaggerated cervical lordosis and thoracic kyphosis. Skull base and vertebrae: No regional fracture Soft tissues and spinal canal: No traumatic soft tissue finding. Disc levels: Chronic arthropathy at the C1-2 articulation. Question CPPD. Chronic degenerative spondylosis C3-4 through C7-T1. Facet osteoarthritis most pronounced on the left at C3-4, C6-7 and C7-T1. Upper chest: No acute upper  thoracic fracture. Solid bridging anterior osteophytes T5 through T7. Other: None IMPRESSION: No acute or traumatic finding. Chronic degenerative spondylosis and facet osteoarthritis. Electronically Signed   By: Paulina Fusi M.D.   On: 08/19/2023 14:13   CT Maxillofacial Wo Contrast  Result Date: 08/19/2023 CLINICAL DATA:  Fall with trauma to the face and neck EXAM: CT MAXILLOFACIAL WITHOUT CONTRAST TECHNIQUE: Multidetector CT imaging of the maxillofacial structures was performed. Multiplanar CT image reconstructions were also generated. RADIATION DOSE REDUCTION: This exam was performed according to the departmental dose-optimization program which includes automated exposure control, adjustment of the mA and/or kV according to patient size and/or use of iterative reconstruction technique. COMPARISON:  12/23/2021 FINDINGS: Osseous: No acute facial fracture. Chronic mucoperiosteal thickening of the left maxillary sinus. Orbits: Normal. No intraorbital injury. Lateral periorbital soft tissue swelling on the right with soft tissue hematoma. Sinuses: Chronic opacification of the left maxillary sinus with mucoperiosteal thickening in calcified internal material. Soft tissues: Otherwise negative. Limited intracranial: No traumatic finding. IMPRESSION: 1. No acute facial fracture. Lateral periorbital soft tissue swelling on the  right with soft tissue hematoma. 2. Chronic opacification of the left maxillary sinus with mucoperiosteal thickening and calcified internal material. Electronically Signed   By: Paulina Fusi M.D.   On: 08/19/2023 14:10   CT Head Wo Contrast  Result Date: 08/19/2023 CLINICAL DATA:  Larey Seat to the floor with trauma to the head EXAM: CT HEAD WITHOUT CONTRAST TECHNIQUE: Contiguous axial images were obtained from the base of the skull through the vertex without intravenous contrast. RADIATION DOSE REDUCTION: This exam was performed according to the departmental dose-optimization program which  includes automated exposure control, adjustment of the mA and/or kV according to patient size and/or use of iterative reconstruction technique. COMPARISON:  12/23/2021 FINDINGS: Brain: Age related volume loss. Chronic small-vessel ischemic change of the white matter. No sign of acute infarction, mass lesion, hemorrhage, hydrocephalus or extra-axial collection. Vascular: There is atherosclerotic calcification of the major vessels at the base of the brain. Skull: Negative.  No skull fracture. Sinuses/Orbits: See results of maxillofacial CT. Other: Right forehead soft tissue hematoma. IMPRESSION: 1. No acute intracranial finding. Age related volume loss. Chronic small-vessel ischemic change of the white matter. 2. Right forehead soft tissue hematoma. Electronically Signed   By: Paulina Fusi M.D.   On: 08/19/2023 14:08      Emin Foree T. Blair [place]deen [facility]  If 7PM-7AM, please contact night-coverage [email or web address] 08/20/2023, 12:20 PM

## 2023-08-20 NOTE — Plan of Care (Signed)
  Problem: Elimination: Goal: Will not experience complications related to urinary retention Outcome: Progressing   Problem: Skin Integrity: Goal: Risk for impaired skin integrity will decrease Outcome: Progressing   Problem: Activity: Goal: Risk for activity intolerance will decrease Outcome: Not Progressing   Problem: Nutrition: Goal: Adequate nutrition will be maintained Outcome: Not Progressing

## 2023-08-21 DIAGNOSIS — Z515 Encounter for palliative care: Secondary | ICD-10-CM | POA: Diagnosis not present

## 2023-08-21 DIAGNOSIS — I10 Essential (primary) hypertension: Secondary | ICD-10-CM | POA: Diagnosis not present

## 2023-08-21 DIAGNOSIS — S728X2A Other fracture of left femur, initial encounter for closed fracture: Secondary | ICD-10-CM | POA: Diagnosis not present

## 2023-08-21 DIAGNOSIS — Z89511 Acquired absence of right leg below knee: Secondary | ICD-10-CM | POA: Diagnosis not present

## 2023-08-21 DIAGNOSIS — S7292XA Unspecified fracture of left femur, initial encounter for closed fracture: Secondary | ICD-10-CM | POA: Diagnosis not present

## 2023-08-21 DIAGNOSIS — Z7189 Other specified counseling: Secondary | ICD-10-CM | POA: Diagnosis not present

## 2023-08-21 DIAGNOSIS — I480 Paroxysmal atrial fibrillation: Secondary | ICD-10-CM | POA: Diagnosis not present

## 2023-08-21 LAB — RENAL FUNCTION PANEL
Albumin: 3.1 g/dL — ABNORMAL LOW (ref 3.5–5.0)
Anion gap: 10 (ref 5–15)
BUN: 33 mg/dL — ABNORMAL HIGH (ref 8–23)
CO2: 21 mmol/L — ABNORMAL LOW (ref 22–32)
Calcium: 8.4 mg/dL — ABNORMAL LOW (ref 8.9–10.3)
Chloride: 98 mmol/L (ref 98–111)
Creatinine, Ser: 1.65 mg/dL — ABNORMAL HIGH (ref 0.44–1.00)
GFR, Estimated: 29 mL/min — ABNORMAL LOW (ref >60)
Glucose, Bld: 120 mg/dL — ABNORMAL HIGH (ref 70–99)
Phosphorus: 4.2 mg/dL (ref 2.5–4.6)
Potassium: 4.2 mmol/L (ref 3.5–5.1)
Sodium: 129 mmol/L — ABNORMAL LOW (ref 135–145)

## 2023-08-21 LAB — CBC
HCT: 29.9 % — ABNORMAL LOW (ref 36.0–46.0)
Hemoglobin: 9.4 g/dL — ABNORMAL LOW (ref 12.0–15.0)
MCH: 30.4 pg (ref 26.0–34.0)
MCHC: 31.4 g/dL (ref 30.0–36.0)
MCV: 96.8 fL (ref 80.0–100.0)
Platelets: 197 10*3/uL (ref 150–400)
RBC: 3.09 MIL/uL — ABNORMAL LOW (ref 3.87–5.11)
RDW: 13.9 % (ref 11.5–15.5)
WBC: 10.7 10*3/uL — ABNORMAL HIGH (ref 4.0–10.5)
nRBC: 0 % (ref 0.0–0.2)

## 2023-08-21 LAB — OSMOLALITY, URINE: Osmolality, Ur: 345 mosm/kg (ref 300–900)

## 2023-08-21 LAB — SODIUM, URINE, RANDOM: Sodium, Ur: 50 mmol/L

## 2023-08-21 LAB — URINE CULTURE: Culture: >=100000 — AB

## 2023-08-21 LAB — MAGNESIUM: Magnesium: 2.1 mg/dL (ref 1.7–2.4)

## 2023-08-21 MED ORDER — ACETAMINOPHEN 500 MG PO TABS
1000.0000 mg | ORAL_TABLET | Freq: Three times a day (TID) | ORAL | Status: DC
  Administered 2023-08-21 – 2023-08-23 (×5): 1000 mg via ORAL
  Filled 2023-08-21 (×6): qty 2

## 2023-08-21 MED ORDER — SENNA 8.6 MG PO TABS
1.0000 | ORAL_TABLET | Freq: Once | ORAL | Status: AC
  Administered 2023-08-21: 8.6 mg via ORAL
  Filled 2023-08-21: qty 1

## 2023-08-21 MED ORDER — SODIUM CHLORIDE 0.9 % IV BOLUS
500.0000 mL | Freq: Once | INTRAVENOUS | Status: AC
  Administered 2023-08-21: 500 mL via INTRAVENOUS

## 2023-08-21 MED ORDER — SODIUM CHLORIDE 0.9 % IV SOLN
1.0000 g | Freq: Every day | INTRAVENOUS | Status: DC
  Administered 2023-08-21: 1 g via INTRAVENOUS
  Filled 2023-08-21: qty 10

## 2023-08-21 MED ORDER — SODIUM CHLORIDE 0.9 % IV SOLN: INTRAVENOUS | Status: DC

## 2023-08-21 MED ORDER — CEFAZOLIN SODIUM-DEXTROSE 1-4 GM/50ML-% IV SOLN
1.0000 g | Freq: Two times a day (BID) | INTRAVENOUS | Status: DC
  Administered 2023-08-22 – 2023-08-23 (×3): 1 g via INTRAVENOUS
  Filled 2023-08-21 (×3): qty 50

## 2023-08-21 MED ORDER — SENNOSIDES-DOCUSATE SODIUM 8.6-50 MG PO TABS
1.0000 | ORAL_TABLET | Freq: Every day | ORAL | Status: AC
  Administered 2023-08-21 – 2023-08-22 (×2): 1 via ORAL
  Filled 2023-08-21 (×2): qty 1

## 2023-08-21 NOTE — Plan of Care (Signed)
  Problem: Activity: Goal: Risk for activity intolerance will decrease Outcome: Progressing   Problem: Coping: Goal: Level of anxiety will decrease Outcome: Progressing   Problem: Pain Managment: Goal: General experience of comfort will improve Outcome: Progressing   

## 2023-08-21 NOTE — Plan of Care (Signed)
  Problem: Clinical Measurements: Goal: Will remain free from infection Outcome: Progressing Goal: Respiratory complications will improve Outcome: Progressing   Problem: Activity: Goal: Risk for activity intolerance will decrease 08/21/2023 1719 by Vernetta Honey, RN Outcome: Not Progressing 08/21/2023 1719 by Vernetta Honey, RN Outcome: Not Progressing   Problem: Nutrition: Goal: Adequate nutrition will be maintained 08/21/2023 1719 by Vernetta Honey, RN Outcome: Not Progressing 08/21/2023 1719 by Vernetta Honey, RN Outcome: Not Progressing   Problem: Elimination: Goal: Will not experience complications related to urinary retention Outcome: Not Progressing

## 2023-08-21 NOTE — Consult Note (Signed)
Palliative Care Consult Note                                  Date: 08/21/2023   Patient Name: [name]  DOB: 1927/12/25  MRN: [id number]  Age / Sex: 89 y.o., female  PCP: Marden Noble, MD Referring Physician: Almon Hercules, MD  Reason for Consultation: Establishing goals of care  HPI/Patient Profile: 89 y.o. female  with past medical history of fibromyalgia, right BKA, TIA, a-fib, HFpEF, and anemia admitted on 08/19/2023 for evaluation after a fall when transferring from wheelchair to commode.   Past Medical History:  Diagnosis Date   Anemia    Anginal pain (HCC)    Arthritis    "qwhere" (09/05/2016)   Atrial fibrillation (HCC) 09/2016   Chronic lower back pain    Complication of anesthesia    "takes a long time for it to wear off; I can hallucinate if I take too much" (09/05/2016)   DVT (deep venous thrombosis) (HCC) 10/2009   Fall from steps 08/31/2013   Fx. pelvis, Left Hip, Left Elbow   Fibromyalgia    GERD (gastroesophageal reflux disease)    09/22/16- "no too much anymore"   GI bleed 10/24/2016   High cholesterol    History of blood transfusion    History of hiatal hernia    Hypertension    Macular degeneration, wet (HCC)    "started in right eye; now legally blind in that eye; now started in left eye but pretty much in control" (09/05/2016)   Osteoporosis    Peripheral vascular disease (HCC)    nonviable tissue Right foot   PONV (postoperative nausea and vomiting)    Squamous cell carcinoma of skin of right calf Aug. 2015   Stroke [facility])    TIA's no residual   TIA (transient ischemic attack)    "several at once; none in a long time" (09/05/2016)    Subjective:   I have reviewed medical records including EPIC notes, labs and imaging, assessed the patient and then met with the patient and her daughter Aram Beecham at bedside to discuss diagnosis prognosis, GOC, EOL wishes, disposition and options.  I  introduced Palliative Medicine as specialized medical care for people living with serious illness. It focuses on providing relief from symptoms and stress of a serious illness. The goal is to improve quality of life for both the patient and the family.  Today's Discussion: Patient and her daughter have a good understanding of her chronic illnesses and acute hospitalization. While at her daughter's house this week the patient had a fall when transferring with her board from the wheelchair to commode (this is only the second fall she has had in 6 years of transferring).The patient's a widow (she lost her husband the year she had her BKA). The patient lives at [facility] [facility] [place] where she is able to maintain some independence in [place] and IADLs. The patient is in a wheelchair but transfers independently from bed, bathroom, and couch. She has a good appetite when she enjoys the food but often times does not enjoy the food at [employer].   A discussion was had today regarding advanced directives. HCPOA is completed and in Vynca. Daughter Lyli Stracener is proxy Management consultant. We discussed code status. Confirmed DNR status. Discussed scopes of care and patient confirms limited scope of treatment. She states quality of life is more important to her than length of life  and she would not want extraordinary measures such as intubation.  We discussed her left femur pain. She reports it is currently throbbing and a 5/10 but does get to a 10/10. She has fibromyalgia so she always has some level of pain. She states the oxycodone is currently helping and she sometimes uses it at home. We discussed changing her tylenol to 1000 mg three times a day. We discuss her last bowel movement was Tuesday. She has a history of IBS with diarrhea. She is very cautious to use a bowel regimen. We discuss that she has taken a significant amount of opioids while hospitalized and I am concerned about constipation. Attending provider already started a  bowel regimen. I will add an additional dose of senna tonight and reassess tomorrow.  Discussed the importance of continued conversation with family and the medical providers regarding overall plan of care and treatment options, ensuring decisions are within the context of the patient's values and GOCs.  Questions and concerns were addressed. The family was encouraged to call with questions or concerns. PMT will continue to support holistically. Discussed the option of adding an outpatient palliative consult but right now they feel good with the [facility] provider managing symptoms.  Review of Systems  Constitutional:  Positive for appetite change (decreased appetite).  Musculoskeletal:        Left leg pain    Objective:   Primary Diagnoses: Present on Admission:  Anemia  Benign essential HTN  PAD (peripheral artery disease) (HCC)  PAF (paroxysmal atrial fibrillation) (HCC)  Stage 3 chronic kidney disease (HCC)  Femur fracture, left (HCC)   Physical Exam Vitals reviewed.  Pulmonary:     Effort: Pulmonary effort is normal.  Musculoskeletal:     Comments: Right BKA  Skin:    General: Skin is warm and dry.     Findings: Bruising present.     Comments: Right eye bruising  Neurological:     Mental Status: She is alert and oriented to person, place, and time.  Psychiatric:        Mood and Affect: Mood normal.        Behavior: Behavior normal.        Thought Content: Thought content normal.        Judgment: Judgment normal.     Vital Signs:  BP (!) 90/48 (BP Location: Right Arm)   Pulse (!) 112   Temp 98.6 F (37 C) (Oral)   Resp 18   Ht 5\' 2"  (1.575 m)   Wt 52.2 kg   SpO2 95%   BMI 21.03 kg/m   Palliative Assessment/Data: 50%    Advanced Care Planning:   Existing Vynca/ACP Documentation: HCPOA: Yoshika Pesantes is her health care proxy.  Primary Decision Maker: PATIENT  Code Status/Advance Care Planning: DNR  Assessment & Plan:   SUMMARY OF  RECOMMENDATIONS   DNR Limited scope of treatment- DNI Treat the treatable Return to [facility] Pain management: Change tylenol to 1000 mg tid Constipation: One time senna for tonight (10/4) Continued PMT support  Discussed with: Dr. Alanda Slim  Time Total: 75 minutes  Thank you for allowing [place] to participate in the care of [facility]OUR[facility] TEACHOUT PMT will continue to support holistically.    Signed by: Sarina Ser, NP Palliative Medicine Team  Team Phone # [phone] (Nights/Weekends)  08/21/2023, 10:56 AM

## 2023-08-21 NOTE — Care Management Important Message (Signed)
Important Message  Patient Details  Name: [name] MRN: [id number] Date of Birth: 1928-09-20   Important Message Given:  Yes - Medicare IM     Sherilyn Banker 08/21/2023, 12:09 PM

## 2023-08-21 NOTE — Progress Notes (Signed)
PROGRESS NOTE  [name] BMW:[id number] DOB: 12-10-1927   PCP: Marden Noble, MD  Patient is from: Home but lives at LTC.  Wheelchair dependent.  Uses board for transfer.  DOA: 08/19/2023 LOS: 2  Chief complaints Chief Complaint  Patient presents with   Fall     Brief Narrative / Interim history: 89 year old F with PMH of A-fib not on AC due to GIB, HFpEF, HTN, bilateral PAD s/p right BKA, ambulatory dysfunction with wheelchair dependence brought to ED after she had accidental fall transferring from wheelchair to commode at the daughter's home, and found to have left distal femoral fracture.  Patient normally resides at SNF/LTC went to visit her daughter.  Orthopedic surgery consulted and recommended conservative management given severe left PAD.  Palliative medicine consulted and recommended outpatient follow-up.  Now with AKI and possible UTI.  On IV fluid and IV antibiotics.  Likely return to SNF in the next 24 to 48 hours    Subjective: Seen and examined earlier this morning.  Patient is sleepy but wakes to voice.  No major events overnight of this morning.  Not eating or drinking enough.  Pain improved.  Patient's daughter at bedside.    Objective: Vitals:   08/21/23 0646 08/21/23 0911 08/21/23 0939 08/21/23 1157  BP:  (!) 90/48 (!) 99/48 (!) 109/43  Pulse: (!) 112  83   Resp: 18  16   Temp:  98.6 F (37 C) 98.6 F (37 C)   TempSrc:  Oral Oral   SpO2: 92% 97% 95%   Weight:      Height:        Examination:  GENERAL: Appears frail. HEENT: Dry oral mucous membrane.  Vision and hearing grossly intact.  Bruising around right eye NECK: Supple.  No apparent JVD.  RESP:  No IWOB.  Fair aeration bilaterally. CVS:  RRR. Heart sounds normal.  ABD/GI/GU: BS+. Abd soft, NTND.  MSK/EXT: Right BKA.  Left leg in brace. SKIN: Bruising around right eye NEURO: Sleepy but wakes to voice.  Falls back asleep quickly.  No apparent focal neuro deficit. PSYCH: Calm. Normal  affect.   Procedures:  None  Microbiology summarized: Urine culture with pansensitive Klebsiella pneumonia except resistant to ampicillin.  Assessment and plan: Principal Problem:   Femur fracture, left (HCC) Active Problems:   PAD (peripheral artery disease) (HCC)   Anemia   S/P unilateral BKA (below knee amputation), right (HCC)   PAF (paroxysmal atrial fibrillation) (HCC)   Stage 3 chronic kidney disease (HCC)   Benign essential HTN   History of CVA (cerebrovascular accident)   History of GI bleed  Accidental fall at home: Patient fell during transfer from wheelchair to bedside commode at daughter's home Closed distal fracture of left femur due to mechanical fall -Ortho recommended conservative management given severe non-salvageable PAD in left leg and bedbound status -Pain control-scheduled Tylenol with as needed oxycodone and Dilaudid -Appreciate input by palliative medicine-pain management and outpatient follow-up.  -Hold off therapy consultation.  I think the focus should be more of quality and symptom management  Severe bilateral PAD s/p right BKA: Has severe PAD of left leg, occluded stent. Followed by vascular as outpt, told nothing more can be done for left leg, but for the time being blood flow has been sufficient to avoid amputation. -On praluent at home -Continue home Plavix.  Essential hypertension/hypotension: Diastolic in 40s.  Systolic ranges from 90s to 100s. -Continue holding home Diovan HCTZ, Lasix and Imdur. -Decreased metoprolol to 12.5  mg twice daily. -IV fluid  AKI on CKD-3B/hyponatremia: Baseline Cr 1.1-1.2.  AKI likely due to poor p.o. intake and soft blood pressure. Recent Labs    05/13/23 1603 08/19/23 1344 08/20/23 0517 08/21/23 0329  BUN 30 27* 26* 33*  CREATININE 1.11* 1.24* 1.40* 1.65*  -Start IV fluid -Monitor renal function and urine output -Holding antihypertensive meds.    Chronic HFpEF: Euvolemic. -Closely monitor while on IV  fluid -Continue home metop, imdur -Continue holding lasix for the time being to allow renal recovery  Generalized anxiety/chronic pain -Continue home meds.  Reduce gabapentin based on renal function -Pain meds as above.  History of CVA: Stable -praluent and plavix as above  Hypothyroidism -home levothyroxine  Urinary tract infection: Patient denies UTI symptoms but not reliable historian.  Now with mental status change although this could be iatrogenic. -Will treat with short course of IV antibiotics for 3 days    Body mass index is 21.03 kg/m.   DVT prophylaxis:  enoxaparin (LOVENOX) injection 30 mg Start: 08/20/23 1000  Code Status: DNR/DNI Family Communication: Updated patient's daughter at bedside Level of care: Med-Surg Status is: Inpatient Remains inpatient appropriate because: Significant pain due to left femoral fracture   Final disposition: SNF Consultants:  Orthopedic surgery Palliative medicine  55 minutes with more than 50% spent in reviewing records, counseling patient/family and coordinating care.   Sch Meds:  Scheduled Meds:  acetaminophen  1,000 mg Oral TID   clopidogrel  75 mg Oral Daily   enoxaparin (LOVENOX) injection  30 mg Subcutaneous Q24H   gabapentin  200 mg Oral TID   levothyroxine  50 mcg Oral Q0600   metoprolol tartrate  12.5 mg Oral BID   polyethylene glycol  17 g Oral Daily   senna  1 tablet Oral Once   senna-docusate  1 tablet Oral Daily   sodium chloride flush  3 mL Intravenous Q12H   Continuous Infusions:  sodium chloride     sodium chloride 65 mL/hr at 08/21/23 0959   [START ON 08/22/2023]  ceFAZolin (ANCEF) IV     PRN Meds:.sodium chloride, diazepam, HYDROmorphone (DILAUDID) injection, ondansetron (ZOFRAN) IV, oxyCODONE, sodium chloride flush  Antimicrobials: Anti-infectives (From admission, onward)    Start     Dose/Rate Route Frequency Ordered Stop   08/22/23 1000  ceFAZolin (ANCEF) IVPB 1 g/50 mL premix        1  g 100 mL/hr over 30 Minutes Intravenous Every 12 hours 08/21/23 1124 08/24/23 0959   08/21/23 0800  cefTRIAXone (ROCEPHIN) 1 g in sodium chloride 0.9 % 100 mL IVPB  Status:  Discontinued        1 g 200 mL/hr over 30 Minutes Intravenous Daily 08/21/23 0730 08/21/23 1124   08/19/23 1500  cefTRIAXone (ROCEPHIN) 1 g in sodium chloride 0.9 % 100 mL IVPB  Status:  Discontinued        1 g 200 mL/hr over 30 Minutes Intravenous  Once 08/19/23 1457 08/19/23 1620        I have personally reviewed the following labs and images: CBC: Recent Labs  Lab 08/19/23 1137 08/20/23 0517 08/21/23 0329  WBC 8.9 11.1* 10.7*  NEUTROABS 3.9  --   --   HGB 11.7* 9.9* 9.4*  HCT 36.5 30.7* 29.9*  MCV 96.6 95.3 96.8  PLT 209 218 197   BMP &GFR Recent Labs  Lab 08/19/23 1344 08/20/23 0517 08/21/23 0329  NA 135 134* 129*  K 4.6 4.6 4.2  CL 102 101 98  CO2  24 25 21*  GLUCOSE 127* 127* 120*  BUN 27* 26* 33*  CREATININE 1.24* 1.40* 1.65*  CALCIUM 8.9 8.7* 8.4*  MG  --   --  2.1  PHOS  --   --  4.2   Estimated Creatinine Clearance: 17.2 mL/min (A) (by C-G formula based on SCr of 1.65 mg/dL (H)). Liver & Pancreas: Recent Labs  Lab 08/21/23 0329  ALBUMIN 3.1*   No results for input(s): "LIPASE", "AMYLASE" in the last 168 hours. No results for input(s): "AMMONIA" in the last 168 hours. Diabetic: No results for input(s): "HGBA1C" in the last 72 hours. No results for input(s): "GLUCAP" in the last 168 hours. Cardiac Enzymes: No results for input(s): "CKTOTAL", "CKMB", "CKMBINDEX", "TROPONINI" in the last 168 hours. Recent Labs    05/13/23 1603  PROBNP 331   Coagulation Profile: No results for input(s): "INR", "PROTIME" in the last 168 hours. Thyroid Function Tests: No results for input(s): "TSH", "T4TOTAL", "FREET4", "T3FREE", "THYROIDAB" in the last 72 hours. Lipid Profile: No results for input(s): "CHOL", "HDL", "LDLCALC", "TRIG", "CHOLHDL", "LDLDIRECT" in the last 72 hours. Anemia  Panel: No results for input(s): "VITAMINB12", "FOLATE", "FERRITIN", "TIBC", "IRON", "RETICCTPCT" in the last 72 hours. Urine analysis:    Component Value Date/Time   COLORURINE YELLOW 08/19/2023 1313   APPEARANCEUR CLOUDY (A) 08/19/2023 1313   LABSPEC 1.009 08/19/2023 1313   PHURINE 5.0 08/19/2023 1313   GLUCOSEU NEGATIVE 08/19/2023 1313   HGBUR NEGATIVE 08/19/2023 1313   BILIRUBINUR NEGATIVE 08/19/2023 1313   KETONESUR NEGATIVE 08/19/2023 1313   PROTEINUR NEGATIVE 08/19/2023 1313   UROBILINOGEN 0.2 09/04/2013 0130   NITRITE POSITIVE (A) 08/19/2023 1313   LEUKOCYTESUR LARGE (A) 08/19/2023 1313   Sepsis Labs: Invalid input(s): "PROCALCITONIN", "LACTICIDVEN"  Microbiology: Recent Results (from the past 240 hour(s))  Urine Culture     Status: Abnormal   Collection Time: 08/19/23  2:58 PM   Specimen: Urine, Catheterized  Result Value Ref Range Status   Specimen Description URINE, CATHETERIZED  Final   Special Requests   Final    NONE Performed at [facility], [address], [place], [place] [address]    Culture >=100,000 COLONIES/mL KLEBSIELLA PNEUMONIAE (A)  Final   Report Status 08/21/2023 FINAL  Final   Organism ID, Bacteria KLEBSIELLA PNEUMONIAE (A)  Final      Susceptibility   Klebsiella pneumoniae - MIC*    AMPICILLIN RESISTANT Resistant     CEFAZOLIN <=4 SENSITIVE Sensitive     CEFEPIME <=0.12 SENSITIVE Sensitive     CEFTRIAXONE <=0.25 SENSITIVE Sensitive     CIPROFLOXACIN <=0.25 SENSITIVE Sensitive     GENTAMICIN <=1 SENSITIVE Sensitive     IMIPENEM <=0.25 SENSITIVE Sensitive     NITROFURANTOIN <=16 SENSITIVE Sensitive     TRIMETH/SULFA <=20 SENSITIVE Sensitive     AMPICILLIN/SULBACTAM 4 SENSITIVE Sensitive     PIP/TAZO <=4 SENSITIVE Sensitive     * >=100,000 COLONIES/mL KLEBSIELLA PNEUMONIAE    Radiology Studies: No results found.    Belinda Bringhurst T. Adewale Pucillo [facility]  If 7PM-7AM, please contact night-coverage [email or web address] 08/21/2023, 11:58 AM

## 2023-08-21 NOTE — Plan of Care (Signed)
  Problem: Activity: Goal: Risk for activity intolerance will decrease Outcome: Progressing   Problem: Nutrition: Goal: Adequate nutrition will be maintained Outcome: Progressing   Problem: Pain Managment: Goal: General experience of comfort will improve Outcome: Progressing   

## 2023-08-21 NOTE — TOC Progression Note (Signed)
Transition of Care [facility]) - Progression Note    Patient Details  Name: ELERI RUBEN MRN: [id number] Date of Birth: 1928/03/01  [facility] of [facility]) CM/SW Contact  Lorri Frederick, LCSW Phone Number: 08/21/2023, 1:50 PM  Clinical Narrative:   Per [facility], pt from LTC, contact for pt to return: Carollee Herter [phone]    Expected Discharge Plan: Skilled Nursing Facility Barriers to Discharge: Continued Medical Work up  Expected Discharge Plan and Services In-house Referral: Clinical Social Work   Post Acute Care Choice: Resumption of Svcs/PTA Provider (from [facility]) Living arrangements for the past 2 months: Skilled Nursing Facility ([facility])                                       Social Determinants of Health (SDOH) Interventions SDOH Screenings   Food Insecurity: No Food Insecurity (08/20/2023)  Housing: Low Risk  (08/20/2023)  Transportation Needs: No Transportation Needs (08/20/2023)  Utilities: Not At Risk (08/20/2023)  Tobacco Use: Medium Risk (07/06/2023)    Readmission Risk Interventions     No data to display

## 2023-08-22 DIAGNOSIS — Z515 Encounter for palliative care: Secondary | ICD-10-CM | POA: Diagnosis not present

## 2023-08-22 DIAGNOSIS — S7292XA Unspecified fracture of left femur, initial encounter for closed fracture: Secondary | ICD-10-CM | POA: Diagnosis not present

## 2023-08-22 DIAGNOSIS — K5903 Drug induced constipation: Secondary | ICD-10-CM

## 2023-08-22 DIAGNOSIS — T402X5A Adverse effect of other opioids, initial encounter: Secondary | ICD-10-CM | POA: Diagnosis not present

## 2023-08-22 DIAGNOSIS — I10 Essential (primary) hypertension: Secondary | ICD-10-CM | POA: Diagnosis not present

## 2023-08-22 LAB — RENAL FUNCTION PANEL
Albumin: 2.5 g/dL — ABNORMAL LOW (ref 3.5–5.0)
Anion gap: 9 (ref 5–15)
BUN: 21 mg/dL (ref 8–23)
CO2: 21 mmol/L — ABNORMAL LOW (ref 22–32)
Calcium: 8 mg/dL — ABNORMAL LOW (ref 8.9–10.3)
Chloride: 100 mmol/L (ref 98–111)
Creatinine, Ser: 1.14 mg/dL — ABNORMAL HIGH (ref 0.44–1.00)
GFR, Estimated: 45 mL/min — ABNORMAL LOW (ref >60)
Glucose, Bld: 153 mg/dL — ABNORMAL HIGH (ref 70–99)
Phosphorus: 2.7 mg/dL (ref 2.5–4.6)
Potassium: 3.5 mmol/L (ref 3.5–5.1)
Sodium: 130 mmol/L — ABNORMAL LOW (ref 135–145)

## 2023-08-22 LAB — CBC
HCT: 26.5 % — ABNORMAL LOW (ref 36.0–46.0)
Hemoglobin: 8.8 g/dL — ABNORMAL LOW (ref 12.0–15.0)
MCH: 31.9 pg (ref 26.0–34.0)
MCHC: 33.2 g/dL (ref 30.0–36.0)
MCV: 96 fL (ref 80.0–100.0)
Platelets: 206 10*3/uL (ref 150–400)
RBC: 2.76 MIL/uL — ABNORMAL LOW (ref 3.87–5.11)
RDW: 13.5 % (ref 11.5–15.5)
WBC: 9.8 10*3/uL (ref 4.0–10.5)
nRBC: 0 % (ref 0.0–0.2)

## 2023-08-22 LAB — MAGNESIUM: Magnesium: 1.9 mg/dL (ref 1.7–2.4)

## 2023-08-22 MED ORDER — BOOST / RESOURCE BREEZE PO LIQD CUSTOM
1.0000 | Freq: Three times a day (TID) | ORAL | Status: DC
  Administered 2023-08-22 (×3): 1 via ORAL

## 2023-08-22 MED ORDER — SENNA 8.6 MG PO TABS
1.0000 | ORAL_TABLET | Freq: Once | ORAL | Status: AC
  Administered 2023-08-22: 8.6 mg via ORAL
  Filled 2023-08-22: qty 1

## 2023-08-22 MED ORDER — TAB-A-VITE/IRON PO TABS
1.0000 | ORAL_TABLET | Freq: Every day | ORAL | Status: DC
  Administered 2023-08-22 – 2023-08-23 (×2): 1 via ORAL
  Filled 2023-08-22 (×2): qty 1

## 2023-08-22 MED ORDER — METOPROLOL TARTRATE 25 MG PO TABS
25.0000 mg | ORAL_TABLET | Freq: Two times a day (BID) | ORAL | Status: DC
  Administered 2023-08-22 – 2023-08-23 (×3): 25 mg via ORAL
  Filled 2023-08-22 (×3): qty 1

## 2023-08-22 MED ORDER — SENNA 8.6 MG PO TABS: 2.0000 | ORAL_TABLET | Freq: Every day | ORAL | Status: DC

## 2023-08-22 NOTE — Progress Notes (Signed)
Daily Progress Note   Patient Name: [name]       Date: 08/22/2023 DOB: May 04, 1928  Age: 89 y.o. MRN#: [id number] Attending Physician: Almon Hercules, MD Primary Care Physician: Marden Noble, MD Admit Date: 08/19/2023  Reason for Consultation/Follow-up: Establishing goals of care  Length of Stay: 3  Current Medications: Scheduled Meds:   acetaminophen  1,000 mg Oral TID   clopidogrel  75 mg Oral Daily   enoxaparin (LOVENOX) injection  30 mg Subcutaneous Q24H   feeding supplement  1 Container Oral TID BM   gabapentin  200 mg Oral TID   levothyroxine  50 mcg Oral Q0600   metoprolol tartrate  25 mg Oral BID   multivitamins with iron  1 tablet Oral Daily   [START ON 08/23/2023] senna  2 tablet Oral QHS   senna  1 tablet Oral Once   senna-docusate  1 tablet Oral Daily    Continuous Infusions:  sodium chloride 65 mL/hr at 08/21/23 0959    ceFAZolin (ANCEF) IV      PRN Meds: diazepam, HYDROmorphone (DILAUDID) injection, ondansetron (ZOFRAN) IV, oxyCODONE  Physical Exam Vitals reviewed.  Cardiovascular:     Rate and Rhythm: Tachycardia present.  Pulmonary:     Effort: Pulmonary effort is normal.  Musculoskeletal:     Comments: Right BKA  Skin:    General: Skin is warm and dry.     Findings: Bruising present.  Neurological:     Mental Status: She is alert and oriented to person, place, and time.  Psychiatric:        Mood and Affect: Mood normal.        Behavior: Behavior normal.        Thought Content: Thought content normal.        Judgment: Judgment normal.             Vital Signs: BP 124/60 (BP Location: Right Arm)   Pulse (!) 109   Temp 97.9 F (36.6 C) (Oral)   Resp 17   Ht 5\' 2"  (1.575 m)   Wt 52.2 kg   SpO2 96%   BMI 21.03 kg/m  SpO2: SpO2: 96  % O2 Device: O2 Device: Room Air O2 Flow Rate:     LBM: Last BM Date : 08/19/23 Baseline Weight: Weight: 52.2 kg Most recent  weight: Weight: 52.2 kg   Patient Active Problem List   Diagnosis Date Noted   History of GI bleed 08/19/2023   Femur fracture, left (HCC) 08/19/2023   Chest pain of uncertain etiology 08/14/2021   History of CVA (cerebrovascular accident) 08/14/2021   Thrombosis 05/04/2020   Secondary hypercoagulable state (HCC) 01/09/2020   Critical lower limb ischemia (HCC) 12/28/2019   Plantar fasciitis of left foot 06/10/2017   Idiopathic chronic venous hypertension of both lower extremities with inflammation 05/21/2017   Foot drop, left 05/21/2017   Decubitus ulcer of sacral region, unstageable (HCC)    Deep tissue injury    Hypokalemia    Hypoalbuminemia due to protein-calorie malnutrition (HCC)    Debilitated 01/21/2017   Neuropathic pain    Post-operative pain    Slow transit constipation    Debility    Ischemic leg    PAF (paroxysmal atrial fibrillation) (HCC)    Anemia of chronic disease    Chronic pain syndrome    Fibromyalgia    Stage 3 chronic kidney disease (HCC)    Benign essential HTN    Abnormal urinalysis    PVD (peripheral vascular disease) (HCC) 01/13/2017   Post-op pain 01/03/2017   Phantom limb pain (HCC) 01/03/2017   S/P unilateral BKA (below knee amputation), right (HCC) 12/30/2016   Acute on chronic combined systolic and diastolic CHF (congestive heart failure) (HCC) 12/22/2016   Anemia 12/22/2016   Palliative care encounter    Lower extremity pain, right    Acute GI bleeding 10/23/2016   Paroxysmal atrial fibrillation (HCC) 10/03/2016   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) 09/24/2016   PAD (peripheral artery disease) (HCC) 09/05/2016   Groin pain 04/02/2015   Aftercare following surgery of the circulatory system, NEC 12/13/2013   Shingles 10/26/2013   Anxiety 10/26/2013    Acute blood loss anemia 09/05/2013   Elbow fracture, left 09/05/2013   Left acetabular fracture (HCC) 09/05/2013   Ankle fracture, left 09/05/2013   HTN (hypertension) 09/03/2013   HLD (hyperlipidemia) 09/03/2013   Peripheral vascular disease, unspecified (HCC) 05/10/2012   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 female  with past medical history of fibromyalgia, right BKA, TIA, a-fib, HFpEF, and anemia admitted on 08/19/2023 for evaluation after a fall when transferring from wheelchair to commode.   Found to have left distal femoral fracture. Orthopedic surgery consulted and recommended conservative management given severe left PAD   Assessment & Today's Discussion: Patient is laying in bed with daughter at bedside. Patient had a rough last half of her night. She had increased pain requiring dilaudid twice and oxycodone once. She did not receive her 2200 dose of tylenol. Her daughter will make sure she receives it tonight-- we are hopeful it will reduce her total opioid needs. She has not had a bowel movement. Discontinued her miralax as she does not like it and will not take it. Increased total senna dose and starting tomorrow she will receive her dose at night. We discussed increasing her fluid intake which her daughter states she did yesterday.  Daughter shares that they will need to rearrange her furniture at [facility] before she returns.   Encouraged patient and daughter to call PMT with any needs. Will continue to support.  Recommendations/Plan: DNR Limited scope of treatment- DNI Treat the treatable Return to [facility] Pain management: Continue tylenol to  1000 mg tid Constipation: Will discontinue Miralax and increase daily senna Continued PMT support   Code Status:    Code Status Orders  (From admission, onward)           Start     Ordered   08/21/23 1102  Do not attempt  resuscitation (DNR)- Limited -Do Not Intubate (DNI)  (Code Status)  Continuous       Question Answer Comment  If pulseless and not breathing No CPR or chest compressions.   In Pre-Arrest Conditions (Patient Is Breathing and Has A Pulse) Do not intubate. Provide all appropriate non-invasive medical interventions. Avoid ICU transfer unless indicated or required.   Consent: Discussion documented in EHR or advanced directives reviewed      08/21/23 1103          Care plan was discussed with Dr. Alanda Slim  Time spent: 35 minutes  Thank you for allowing the Palliative Medicine Team to assist in the care of this patient.    Sherryll Burger, NP  Please contact Palliative Medicine Team phone at [phone] for questions and concerns.

## 2023-08-22 NOTE — Plan of Care (Signed)

## 2023-08-22 NOTE — Progress Notes (Signed)
PROGRESS NOTE  [name] QMV:[id number] DOB: 12/11/1927   PCP: Marden Noble, MD  Patient is from: Home but lives at LTC.  Wheelchair dependent.  Uses board for transfer.  DOA: 08/19/2023 LOS: 3  Chief complaints Chief Complaint  Patient presents with   Fall     Brief Narrative / Interim history: 89 year old F with PMH of A-fib not on AC due to GIB, HFpEF, HTN, bilateral PAD s/p right BKA, ambulatory dysfunction with wheelchair dependence brought to ED after she had accidental fall transferring from wheelchair to commode at the daughter's home, and found to have left distal femoral fracture.  Patient normally resides at SNF/LTC went to visit her daughter.  Orthopedic surgery consulted and recommended conservative management given severe left PAD.  Palliative medicine consulted and recommended outpatient follow-up.  Hospital course significant for AKI (resolved), A-fib with intermittent RVR and possible UTI.  Likely return to SNF in the next 24 to 48 hours    Subjective: Seen and examined earlier this morning.  No major events overnight of this morning.  Woke up with pain about 4 AM.  Pain resolved with IV Dilaudid and p.o. oxycodone.  She is more alert today.  Patient's daughter at bedside.  Has not had a bowel movement yet.  Intermittent RVR.   Objective: Vitals:   08/21/23 1400 08/21/23 2058 08/22/23 0449 08/22/23 0900  BP: (!) 119/50 (!) 118/50 124/60   Pulse: 98 98 (!) 109   Resp:  17    Temp:  98.6 F (37 C) 97.9 F (36.6 C) 97.7 F (36.5 C)  TempSrc:  Oral Oral Oral  SpO2:  96%  96%  Weight:      Height:        Examination:  GENERAL: Appears frail. HEENT: MMM.  Vision and hearing grossly intact.  Bruising around right eye NECK: Supple.  No apparent JVD.  RESP:  No IWOB.  Fair aeration bilaterally. CVS: Irregular rhythm.  HR in 100s. Heart sounds normal.  ABD/GI/GU: BS+. Abd soft, NTND.  MSK/EXT: Right BKA.  Left leg in brace. SKIN: Bruising around right  eye NEURO: Awake and alert.  Fairly oriented.  No apparent focal neuro deficit. PSYCH: Calm. Normal affect.   Procedures:  None  Microbiology summarized: Urine culture with pansensitive Klebsiella pneumonia except resistant to ampicillin.  Assessment and plan: Principal Problem:   Femur fracture, left (HCC) Active Problems:   PAD (peripheral artery disease) (HCC)   Anemia   S/P unilateral BKA (below knee amputation), right (HCC)   PAF (paroxysmal atrial fibrillation) (HCC)   Stage 3 chronic kidney disease (HCC)   Benign essential HTN   History of CVA (cerebrovascular accident)   History of GI bleed  Accidental fall at home: Patient fell during transfer from wheelchair to bedside commode at daughter's home Closed distal fracture of left femur due to mechanical fall -Ortho recommended conservative management given severe non-salvageable PAD in left leg and bedbound status -Pain control-scheduled Tylenol with as needed oxycodone and Dilaudid -Appreciate input by palliative medicine-pain management and outpatient follow-up.  -Hold off therapy consultation.  I think the focus should be more of quality and symptom management  Severe bilateral PAD s/p right BKA: Has severe PAD of left leg, occluded stent. Followed by vascular as outpt, told nothing more can be done for left leg, but for the time being blood flow has been sufficient to avoid amputation. -On praluent at home -Continue home Plavix.  Essential hypertension/hypotension: Hypotension resolved. -Continue holding home Diovan HCTZ,  Lasix and Imdur. -Increase metoprolol to 25 mg twice daily given A-fib with mild RVR -Discontinue IV fluid  AKI on CKD-3B/hyponatremia: Baseline Cr 1.1-1.2.  AKI likely due to poor p.o. intake and soft BP.  AKI resolved.  Hyponatremia improved.  Recent Labs    05/13/23 1603 08/19/23 1344 08/20/23 0517 08/21/23 0329 08/22/23 1043  BUN 30 27* 26* 33* 21  CREATININE 1.11* 1.24* 1.40* 1.65*  1.14*  -Discontinue IV fluid -Monitor renal function and urine output    Chronic HFpEF: Euvolemic. -Closely monitor while on IV fluid -Continue home metop, imdur -Continue holding lasix for the time being to allow renal recovery  Paroxysmal A-fib with RVR: Not on anticoagulation at home due to risk of bleeding.  On Plavix for CVA. -Increase metoprolol to 25 mg twice daily -Optimize electrolytes  Normocytic anemia: Slight drop in Hgb likely dilutional from IV fluid. Recent Labs    05/13/23 1603 08/19/23 1137 08/20/23 0517 08/21/23 0329 08/22/23 1043  HGB 12.8 11.7* 9.9* 9.4* 8.8*  -Monitor   Generalized anxiety/chronic pain -Continue home meds.  Reduce gabapentin based on renal function -Pain meds as above.  History of CVA: Stable -praluent and plavix as above  Hypothyroidism -home levothyroxine  Urinary tract infection:  -IV antibiotics for 3 days.    Body mass index is 21.03 kg/m.   DVT prophylaxis:  enoxaparin (LOVENOX) injection 30 mg Start: 08/20/23 1000  Code Status: DNR/DNI Family Communication: Updated patient's daughter at bedside Level of care: Med-Surg Status is: Inpatient Remains inpatient appropriate because: Significant pain due to left femoral fracture   Final disposition: SNF Consultants:  Orthopedic surgery Palliative medicine  35 minutes with more than 50% spent in reviewing records, counseling patient/family and coordinating care.   Sch Meds:  Scheduled Meds:  acetaminophen  1,000 mg Oral TID   clopidogrel  75 mg Oral Daily   enoxaparin (LOVENOX) injection  30 mg Subcutaneous Q24H   feeding supplement  1 Container Oral TID BM   gabapentin  200 mg Oral TID   levothyroxine  50 mcg Oral Q0600   metoprolol tartrate  25 mg Oral BID   multivitamins with iron  1 tablet Oral Daily   [START ON 08/23/2023] senna  2 tablet Oral QHS   senna  1 tablet Oral Once   Continuous Infusions:  sodium chloride 65 mL/hr at 08/21/23 0959     ceFAZolin (ANCEF) IV 1 g (08/22/23 0911)   PRN Meds:.diazepam, HYDROmorphone (DILAUDID) injection, ondansetron (ZOFRAN) IV, oxyCODONE  Antimicrobials: Anti-infectives (From admission, onward)    Start     Dose/Rate Route Frequency Ordered Stop   08/22/23 1000  ceFAZolin (ANCEF) IVPB 1 g/50 mL premix        1 g 100 mL/hr over 30 Minutes Intravenous Every 12 hours 08/21/23 1124 08/24/23 0959   08/21/23 0800  cefTRIAXone (ROCEPHIN) 1 g in sodium chloride 0.9 % 100 mL IVPB  Status:  Discontinued        1 g 200 mL/hr over 30 Minutes Intravenous Daily 08/21/23 0730 08/21/23 1124   08/19/23 1500  cefTRIAXone (ROCEPHIN) 1 g in sodium chloride 0.9 % 100 mL IVPB  Status:  Discontinued        1 g 200 mL/hr over 30 Minutes Intravenous  Once 08/19/23 1457 08/19/23 1620        I have personally reviewed the following labs and images: CBC: Recent Labs  Lab 08/19/23 1137 08/20/23 0517 08/21/23 0329 08/22/23 1043  WBC 8.9 11.1* 10.7* 9.8  NEUTROABS 3.9  --   --   --  HGB 11.7* 9.9* 9.4* 8.8*  HCT 36.5 30.7* 29.9* 26.5*  MCV 96.6 95.3 96.8 96.0  PLT 209 218 197 206   BMP &GFR Recent Labs  Lab 08/19/23 1344 08/20/23 0517 08/21/23 0329 08/22/23 1043  NA 135 134* 129* 130*  K 4.6 4.6 4.2 3.5  CL 102 101 98 100  CO2 24 25 21* 21*  GLUCOSE 127* 127* 120* 153*  BUN 27* 26* 33* 21  CREATININE 1.24* 1.40* 1.65* 1.14*  CALCIUM 8.9 8.7* 8.4* 8.0*  MG  --   --  2.1 1.9  PHOS  --   --  4.2 2.7   Estimated Creatinine Clearance: 24.9 mL/min (A) (by C-G formula based on SCr of 1.14 mg/dL (H)). Liver & Pancreas: Recent Labs  Lab 08/21/23 0329 08/22/23 1043  ALBUMIN 3.1* 2.5*   No results for input(s): "LIPASE", "AMYLASE" in the last 168 hours. No results for input(s): "AMMONIA" in the last 168 hours. Diabetic: No results for input(s): "HGBA1C" in the last 72 hours. No results for input(s): "GLUCAP" in the last 168 hours. Cardiac Enzymes: No results for input(s): "CKTOTAL", "CKMB",  "CKMBINDEX", "TROPONINI" in the last 168 hours. Recent Labs    05/13/23 1603  PROBNP 331   Coagulation Profile: No results for input(s): "INR", "PROTIME" in the last 168 hours. Thyroid Function Tests: No results for input(s): "TSH", "T4TOTAL", "FREET4", "T3FREE", "THYROIDAB" in the last 72 hours. Lipid Profile: No results for input(s): "CHOL", "HDL", "LDLCALC", "TRIG", "CHOLHDL", "LDLDIRECT" in the last 72 hours. Anemia Panel: No results for input(s): "VITAMINB12", "FOLATE", "FERRITIN", "TIBC", "IRON", "RETICCTPCT" in the last 72 hours. Urine analysis:    Component Value Date/Time   COLORURINE YELLOW 08/19/2023 1313   APPEARANCEUR CLOUDY (A) 08/19/2023 1313   LABSPEC 1.009 08/19/2023 1313   PHURINE 5.0 08/19/2023 1313   GLUCOSEU NEGATIVE 08/19/2023 1313   HGBUR NEGATIVE 08/19/2023 1313   BILIRUBINUR NEGATIVE 08/19/2023 1313   KETONESUR NEGATIVE 08/19/2023 1313   PROTEINUR NEGATIVE 08/19/2023 1313   UROBILINOGEN 0.2 09/04/2013 0130   NITRITE POSITIVE (A) 08/19/2023 1313   LEUKOCYTESUR LARGE (A) 08/19/2023 1313   Sepsis Labs: Invalid input(s): "PROCALCITONIN", "LACTICIDVEN"  Microbiology: Recent Results (from the past 240 hour(s))  Urine Culture     Status: Abnormal   Collection Time: 08/19/23  2:58 PM   Specimen: Urine, Catheterized  Result Value Ref Range Status   Specimen Description URINE, CATHETERIZED  Final   Special Requests   Final    NONE Performed at [facility], [address], [place], [place] [address]    Culture >=100,000 COLONIES/mL KLEBSIELLA PNEUMONIAE (A)  Final   Report Status 08/21/2023 FINAL  Final   Organism ID, Bacteria KLEBSIELLA PNEUMONIAE (A)  Final      Susceptibility   Klebsiella pneumoniae - MIC*    AMPICILLIN RESISTANT Resistant     CEFAZOLIN <=4 SENSITIVE Sensitive     CEFEPIME <=0.12 SENSITIVE Sensitive     CEFTRIAXONE <=0.25 SENSITIVE Sensitive     CIPROFLOXACIN <=0.25 SENSITIVE Sensitive     GENTAMICIN <=1 SENSITIVE  Sensitive     IMIPENEM <=0.25 SENSITIVE Sensitive     NITROFURANTOIN <=16 SENSITIVE Sensitive     TRIMETH/SULFA <=20 SENSITIVE Sensitive     AMPICILLIN/SULBACTAM 4 SENSITIVE Sensitive     PIP/TAZO <=4 SENSITIVE Sensitive     * >=100,000 COLONIES/mL KLEBSIELLA PNEUMONIAE    Radiology Studies: No results found.    Wilmar Prabhakar T. Denaisha Swango [facility]  If 7PM-7AM, please contact night-coverage [email or web address] 08/22/2023, 1:02 PM

## 2023-08-23 DIAGNOSIS — N1832 Chronic kidney disease, stage 3b: Secondary | ICD-10-CM | POA: Diagnosis not present

## 2023-08-23 DIAGNOSIS — K5903 Drug induced constipation: Secondary | ICD-10-CM | POA: Diagnosis not present

## 2023-08-23 DIAGNOSIS — T402X5A Adverse effect of other opioids, initial encounter: Secondary | ICD-10-CM | POA: Diagnosis not present

## 2023-08-23 DIAGNOSIS — S7292XA Unspecified fracture of left femur, initial encounter for closed fracture: Secondary | ICD-10-CM | POA: Diagnosis not present

## 2023-08-23 DIAGNOSIS — S728X2A Other fracture of left femur, initial encounter for closed fracture: Secondary | ICD-10-CM | POA: Diagnosis not present

## 2023-08-23 DIAGNOSIS — I10 Essential (primary) hypertension: Secondary | ICD-10-CM | POA: Diagnosis not present

## 2023-08-23 DIAGNOSIS — Z89511 Acquired absence of right leg below knee: Secondary | ICD-10-CM | POA: Diagnosis not present

## 2023-08-23 DIAGNOSIS — Z515 Encounter for palliative care: Secondary | ICD-10-CM | POA: Diagnosis not present

## 2023-08-23 LAB — RENAL FUNCTION PANEL
Albumin: 2.6 g/dL — ABNORMAL LOW (ref 3.5–5.0)
Anion gap: 15 (ref 5–15)
BUN: 23 mg/dL (ref 8–23)
CO2: 22 mmol/L (ref 22–32)
Calcium: 8.8 mg/dL — ABNORMAL LOW (ref 8.9–10.3)
Chloride: 99 mmol/L (ref 98–111)
Creatinine, Ser: 1.06 mg/dL — ABNORMAL HIGH (ref 0.44–1.00)
GFR, Estimated: 49 mL/min — ABNORMAL LOW (ref >60)
Glucose, Bld: 111 mg/dL — ABNORMAL HIGH (ref 70–99)
Phosphorus: 3 mg/dL (ref 2.5–4.6)
Potassium: 3.9 mmol/L (ref 3.5–5.1)
Sodium: 136 mmol/L (ref 135–145)

## 2023-08-23 LAB — CBC
HCT: 27.7 % — ABNORMAL LOW (ref 36.0–46.0)
Hemoglobin: 9 g/dL — ABNORMAL LOW (ref 12.0–15.0)
MCH: 31.8 pg (ref 26.0–34.0)
MCHC: 32.5 g/dL (ref 30.0–36.0)
MCV: 97.9 fL (ref 80.0–100.0)
Platelets: 201 10*3/uL (ref 150–400)
RBC: 2.83 MIL/uL — ABNORMAL LOW (ref 3.87–5.11)
RDW: 13.8 % (ref 11.5–15.5)
WBC: 9.9 10*3/uL (ref 4.0–10.5)
nRBC: 0.2 % (ref 0.0–0.2)

## 2023-08-23 LAB — MAGNESIUM: Magnesium: 2.1 mg/dL (ref 1.7–2.4)

## 2023-08-23 MED ORDER — BISACODYL 10 MG RE SUPP
10.0000 mg | Freq: Once | RECTAL | Status: AC
  Administered 2023-08-23: 10 mg via RECTAL
  Filled 2023-08-23: qty 1

## 2023-08-23 MED ORDER — CEFADROXIL 500 MG PO CAPS: 500.0000 mg | ORAL_CAPSULE | Freq: Every day | ORAL | 0 refills | Status: DC

## 2023-08-23 MED ORDER — OXYCODONE HCL 5 MG PO TABS: 5.0000 mg | ORAL_TABLET | Freq: Three times a day (TID) | ORAL | 0 refills | Status: DC | PRN

## 2023-08-23 MED ORDER — METOPROLOL SUCCINATE ER 25 MG PO TB24
25.0000 mg | ORAL_TABLET | Freq: Every day | ORAL | Status: DC
Start: 2023-08-23 — End: 2023-08-23

## 2023-08-23 MED ORDER — OXYCODONE HCL 5 MG PO TABS: 5.0000 mg | ORAL_TABLET | Freq: Three times a day (TID) | ORAL | 0 refills | Status: AC | PRN

## 2023-08-23 MED ORDER — CEFADROXIL 500 MG PO CAPS: 500.0000 mg | ORAL_CAPSULE | Freq: Every day | ORAL | 0 refills | Status: AC

## 2023-08-23 MED ORDER — SENNOSIDES-DOCUSATE SODIUM 8.6-50 MG PO TABS: 1.0000 | ORAL_TABLET | Freq: Two times a day (BID) | ORAL | Status: DC | PRN

## 2023-08-23 MED ORDER — SENNOSIDES-DOCUSATE SODIUM 8.6-50 MG PO TABS: 2.0000 | ORAL_TABLET | Freq: Two times a day (BID) | ORAL | Status: AC | PRN

## 2023-08-23 MED ORDER — ACETAMINOPHEN 500 MG PO TABS: ORAL_TABLET | ORAL | Status: AC

## 2023-08-23 MED ORDER — METOPROLOL SUCCINATE ER 25 MG PO TB24
25.0000 mg | ORAL_TABLET | Freq: Every day | ORAL | 11 refills | Status: AC
Start: 2023-08-23 — End: 2024-08-22

## 2023-08-23 MED ORDER — CEFADROXIL 500 MG PO CAPS: 500.0000 mg | ORAL_CAPSULE | Freq: Every day | ORAL | Status: DC

## 2023-08-23 MED ORDER — METOPROLOL SUCCINATE ER 25 MG PO TB24
25.0000 mg | ORAL_TABLET | Freq: Every day | ORAL | 11 refills | Status: DC
Start: 2023-08-23 — End: 2023-08-23

## 2023-08-23 NOTE — Discharge Summary (Addendum)
Physician Discharge Summary  [name] UJW:[id number] DOB: 1928-09-14 DOA: 08/19/2023  PCP: Marden Noble, MD  Admit date: 08/19/2023 Discharge date: 08/23/2023 Admitted From: Home Disposition: SNF Recommendations for Outpatient Follow-up:  Outpatient follow-up with orthopedic surgery in 2 weeks Recommend palliative follow-up outpatient Check CMP and CBC in 1 week Please follow up on the following pending results: None  Discharge Condition: Stable  CODE STATUS: DNR/DNI  Follow-up Information     Haddix, Gillie Manners, MD. Schedule an appointment as soon as possible for a visit in 2 week(s).   Specialty: Orthopedic Surgery Contact information: [address] [place] [address] [phone]                 Hospital course 89 year old F with PMH of A-fib not on AC due to GIB, HFpEF, HTN, bilateral PAD s/p right BKA, ambulatory dysfunction with wheelchair dependence brought to ED after she had accidental fall transferring from wheelchair to commode at the daughter's home, and found to have left distal femoral fracture.  Patient normally resides at SNF/LTC went to visit her daughter.  Orthopedic surgery consulted and recommended conservative management with knee immobilizer, very minimal weightbearing and outpatient follow-up versus immediate surgical intervention due to severe PAD.  Palliative medicine consulted and recommended outpatient follow-up.   Hospital course significant for AKI (resolved), A-fib with intermittent RVR and possible UTI.  AKI and RVR resolved.  Received IV antibiotics for 2 days in house for possible UTI.  Discharged on p.o. cefadroxil for 2 more days.   See individual problem list below for more.   Problems addressed during this hospitalization Principal Problem:   Femur fracture, left (HCC) Active Problems:   PAD (peripheral artery disease) (HCC)   Anemia   S/P unilateral BKA (below knee amputation), right (HCC)   PAF (paroxysmal atrial  fibrillation) (HCC)   Stage 3 chronic kidney disease (HCC)   Benign essential HTN   History of CVA (cerebrovascular accident)   History of GI bleed   Accidental fall at home: Patient fell during transfer from wheelchair to bedside commode at daughter's home Closed distal fracture of left femur due to mechanical fall -Ortho recommended  conservative management with knee immobilizer, very minimal weightbearing and outpatient follow-up versus immediate surgical intervention due to severe PAD. -Outpatient follow-up with orthopedic surgery as above -Pain control-scheduled Tylenol with as needed oxycodone -Appreciate input by palliative medicine-pain management and outpatient follow-up.  -Bowel regimen for constipation   Severe bilateral PAD s/p right BKA: Has severe PAD of left leg, occluded stent. Followed by vascular as outpt, told nothing more can be done for left leg, but for the time being blood flow has been sufficient to avoid amputation. -Continue home meds   Essential hypertension/hypotension: Hypotension resolved. -Discontinued Diovan/HCT, Lasix and Imdur. -Toprol-XL 25 mg daily partly for A-fib   AKI on CKD-3B/hyponatremia: Baseline Cr 1.1-1.2.  Likely due to poor p.o. intake and soft BP.  Resolved. Recent Labs    05/13/23 1603 08/19/23 1344 08/20/23 0517 08/21/23 0329 08/22/23 1043 08/23/23 0636  BUN 30 27* 26* 33* 21 23  CREATININE 1.11* 1.24* 1.40* 1.65* 1.14* 1.06*  -Recheck in 1 week  Chronic HFpEF: Euvolemic. -Discontinue diuretics due to risk of dehydration.   Paroxysmal A-fib with RVR: Not on anticoagulation at home due to risk of bleeding.  On Plavix for CVA. -Continue Toprol-XL 25 mg daily   Normocytic anemia: Slight drop in Hgb likely dilutional from IV fluid.  Recent Labs    05/13/23 1603  08/19/23 1137 08/20/23 0517 08/21/23 0329 08/22/23 1043 08/23/23 0821  HGB 12.8 11.7* 9.9* 9.4* 8.8* 9.0*  -Recheck CBC in 1 week  Generalized anxiety/chronic  pain -Continue home meds. -Pain meds as above.   History of CVA: Stable -praluent and plavix as above   Hypothyroidism -home levothyroxine   Urinary tract infection: Urine culture with Klebsiella pneumonia. -Received IV antibiotics for 2 days in house.  Discharged on p.o. cefadroxil for 2 more days  Time spent 35 minutes  Vital signs Vitals:   08/22/23 0449 08/22/23 0900 08/22/23 2036 08/23/23 0857  BP: 124/60  (!) 114/49 (!) 147/46  Pulse: (!) 109   87  Temp: 97.9 F (36.6 C) 97.7 F (36.5 C)  97.6 F (36.4 C)  Resp:    18  Height:      Weight:      SpO2:  96%    TempSrc: Oral Oral    BMI (Calculated):         Discharge exam HEENT: MMM.  Vision and hearing grossly intact.  Bruising around right eye NECK: Supple.  No apparent JVD.  RESP:  No IWOB.  Fair aeration bilaterally. CVS: Irregular rhythm.  HR in 100s. Heart sounds normal.  ABD/GI/GU: BS+. Abd soft, NTND.  MSK/EXT: Right BKA.  Left leg in brace. SKIN: Bruising around right eye NEURO: Awake and alert.  Fairly oriented.  No apparent focal neuro deficit. PSYCH: Calm. Normal affect.   Discharge Instructions Discharge Instructions     Diet general   Complete by: As directed    Increase activity slowly   Complete by: As directed    No wound care   Complete by: As directed       Allergies as of 08/23/2023       Reactions   Motrin [ibuprofen] Other (See Comments)   ADVERSE REACTION - GI BLEED   Statins Other (See Comments)   ADVERSE REACTION MUSCLE PAIN & WEAKNESS   Amitriptyline Other (See Comments)   Unknown reaction per daughter, unable to tolerate   Augmentin [amoxicillin-pot Clavulanate] Diarrhea   Doxycycline Diarrhea   Eliquis [apixaban] Other (See Comments)   Lower GI bleeding   Ezetimibe Other (See Comments)   Lisinopril Cough   Lisinopril-hydrochlorothiazide    Other reaction(s): felt "bad" on   Niacin And Related Other (See Comments)   Nitrofurantoin Other (See Comments)   Paxil  [paroxetine] Other (See Comments)   Morphine And Codeine Other (See Comments)   HALLUCINATIONS REACTION IS SIDE EFFECT   Promethazine Hcl Other (See Comments)   IV causes confusion, oral better   Sulfa Antibiotics Nausea And Vomiting        Medication List     STOP taking these medications    diphenoxylate-atropine 2.5-0.025 MG tablet Commonly known as: LOMOTIL   furosemide 20 MG tablet Commonly known as: LASIX   isosorbide mononitrate 30 MG 24 hr tablet Commonly known as: IMDUR   metoprolol tartrate 25 MG tablet Commonly known as: LOPRESSOR   potassium chloride 10 MEQ tablet Commonly known as: KLOR-CON   valsartan-hydrochlorothiazide 160-12.5 MG tablet Commonly known as: DIOVAN-HCT       TAKE these medications    acetaminophen 500 MG tablet Commonly known as: TYLENOL Take 2 tablets (1,000 mg total) by mouth every 8 (eight) hours for 5 days, THEN 2 tablets (1,000 mg total) every 8 (eight) hours as needed for up to 25 days. Start taking on: August 23, 2023 What changed: See the new instructions.   cefadroxil 500  MG capsule Commonly known as: DURICEF Take 1 capsule (500 mg total) by mouth daily for 2 days.   clopidogrel 75 MG tablet Commonly known as: PLAVIX Take 1 tablet (75 mg total) by mouth daily.   diazepam 5 MG tablet Commonly known as: VALIUM Take 0.5 tablets (2.5 mg total) by mouth at bedtime as needed for anxiety.   gabapentin 300 MG capsule Commonly known as: NEURONTIN Take 300 mg by mouth See admin instructions. Take 300mg  by mouth three times daily.  May take an additional 300mg  at bedtime as needed for pain.   levothyroxine 50 MCG tablet Commonly known as: SYNTHROID Take 50 mcg by mouth every morning.   loperamide 2 MG capsule Commonly known as: IMODIUM Take 2 mg by mouth See admin instructions. Take 2mg  by mouth as needed after each loose stool.  Do not exceed 8 capsules in 24 hours.   metoprolol succinate 25 MG 24 hr tablet Commonly  known as: Toprol XL Take 1 tablet (25 mg total) by mouth daily.   oxyCODONE 5 MG immediate release tablet Commonly known as: Oxy IR/ROXICODONE Take 1 tablet (5 mg total) by mouth every 8 (eight) hours as needed for severe pain. What changed:  medication strength when to take this reasons to take this   Praluent 150 MG/ML Soaj Generic drug: Alirocumab Inject 1ml (150 mg total) into the skin every 14 days. What changed: See the new instructions.   PRESERVISION AREDS PO Take 1 capsule by mouth in the morning and at bedtime. What changed: Another medication with the same name was removed. Continue taking this medication, and follow the directions you see here.   senna-docusate 8.6-50 MG tablet Commonly known as: Senokot-S Take 2 tablets by mouth 2 (two) times daily between meals as needed for mild constipation.   VITAMIN B-12 PO Place 1 tablet under the tongue daily.   VITAMIN D PO Take 1,000 Units by mouth daily.        Consultations: Orthopedic surgery Palliative medicine  Procedures/Studies:   DG Foot 2 Views Left  Result Date: 08/19/2023 CLINICAL DATA:  Fall.  Left leg pain.  History of foot drop. EXAM: LEFT FOOT - 2 VIEW COMPARISON:  Left foot radiographs 12/23/2021 FINDINGS: There is diffuse decreased bone mineralization. There is again mild high-grade pes cavus with mild metatarsus adductus and medial subluxation of the navicular with respect to the talus. There is again mild high-grade subchondral sclerosis within the distal talus. There is again high-grade extensor positioning of the great toe metatarsophalangeal joint on all images. Moderate interphalangeal joint space narrowing diffusely. Within the limitations of diffuse decreased bone mineralization, no definite acute fracture is seen. IMPRESSION: 1. Within the limitations of diffuse decreased bone mineralization, no definite acute fracture is seen. 2. There is again mild high-grade pes cavus with mild metatarsus  adductus and medial subluxation of the navicular with respect to the talus. 3. There is again high-grade extensor positioning of the great toe metatarsophalangeal joint on all images. Electronically Signed   By: Neita Garnet M.D.   On: 08/19/2023 15:00   CT Cervical Spine Wo Contrast  Result Date: 08/19/2023 CLINICAL DATA:  Larey Seat with trauma to the head and face EXAM: CT CERVICAL SPINE WITHOUT CONTRAST TECHNIQUE: Multidetector CT imaging of the cervical spine was performed without intravenous contrast. Multiplanar CT image reconstructions were also generated. RADIATION DOSE REDUCTION: This exam was performed according to the departmental dose-optimization program which includes automated exposure control, adjustment of the mA and/or kV according to  patient size and/or use of iterative reconstruction technique. COMPARISON:  None Available. FINDINGS: Alignment: Exaggerated cervical lordosis and thoracic kyphosis. Skull base and vertebrae: No regional fracture Soft tissues and spinal canal: No traumatic soft tissue finding. Disc levels: Chronic arthropathy at the C1-2 articulation. Question CPPD. Chronic degenerative spondylosis C3-4 through C7-T1. Facet osteoarthritis most pronounced on the left at C3-4, C6-7 and C7-T1. Upper chest: No acute upper thoracic fracture. Solid bridging anterior osteophytes T5 through T7. Other: None IMPRESSION: No acute or traumatic finding. Chronic degenerative spondylosis and facet osteoarthritis. Electronically Signed   By: Paulina Fusi M.D.   On: 08/19/2023 14:13   CT Maxillofacial Wo Contrast  Result Date: 08/19/2023 CLINICAL DATA:  Fall with trauma to the face and neck EXAM: CT MAXILLOFACIAL WITHOUT CONTRAST TECHNIQUE: Multidetector CT imaging of the maxillofacial structures was performed. Multiplanar CT image reconstructions were also generated. RADIATION DOSE REDUCTION: This exam was performed according to the departmental dose-optimization program which includes automated  exposure control, adjustment of the mA and/or kV according to patient size and/or use of iterative reconstruction technique. COMPARISON:  12/23/2021 FINDINGS: Osseous: No acute facial fracture. Chronic mucoperiosteal thickening of the left maxillary sinus. Orbits: Normal. No intraorbital injury. Lateral periorbital soft tissue swelling on the right with soft tissue hematoma. Sinuses: Chronic opacification of the left maxillary sinus with mucoperiosteal thickening in calcified internal material. Soft tissues: Otherwise negative. Limited intracranial: No traumatic finding. IMPRESSION: 1. No acute facial fracture. Lateral periorbital soft tissue swelling on the right with soft tissue hematoma. 2. Chronic opacification of the left maxillary sinus with mucoperiosteal thickening and calcified internal material. Electronically Signed   By: Paulina Fusi M.D.   On: 08/19/2023 14:10   CT Head Wo Contrast  Result Date: 08/19/2023 CLINICAL DATA:  Larey Seat to the floor with trauma to the head EXAM: CT HEAD WITHOUT CONTRAST TECHNIQUE: Contiguous axial images were obtained from the base of the skull through the vertex without intravenous contrast. RADIATION DOSE REDUCTION: This exam was performed according to the departmental dose-optimization program which includes automated exposure control, adjustment of the mA and/or kV according to patient size and/or use of iterative reconstruction technique. COMPARISON:  12/23/2021 FINDINGS: Brain: Age related volume loss. Chronic small-vessel ischemic change of the white matter. No sign of acute infarction, mass lesion, hemorrhage, hydrocephalus or extra-axial collection. Vascular: There is atherosclerotic calcification of the major vessels at the base of the brain. Skull: Negative.  No skull fracture. Sinuses/Orbits: See results of maxillofacial CT. Other: Right forehead soft tissue hematoma. IMPRESSION: 1. No acute intracranial finding. Age related volume loss. Chronic small-vessel  ischemic change of the white matter. 2. Right forehead soft tissue hematoma. Electronically Signed   By: Paulina Fusi M.D.   On: 08/19/2023 14:08   DG Knee Right Port  Result Date: 08/19/2023 CLINICAL DATA:  Fall. EXAM: PORTABLE RIGHT KNEE - 1-2 VIEW COMPARISON:  Right tibia/fibula radiographs dated 07/08/2018. FINDINGS: Diffuse osseous demineralization. Status post right below-the-knee amputation. The proximal tibial and fibular resection margins appear intact. No acute fracture. The knee joint is anatomically aligned with degenerative changes. IMPRESSION: No acute osseous abnormality. Status post right below-the-knee amputation. Electronically Signed   By: Hart Robinsons M.D.   On: 08/19/2023 13:36   DG Pelvis Portable  Result Date: 08/19/2023 CLINICAL DATA:  Fall. EXAM: PORTABLE PELVIS 1-2 VIEWS COMPARISON:  Hip radiographs dated June 21, 2019. FINDINGS: There is no evidence of pelvic fracture or diastasis. The hip joints are anatomically aligned with left-greater-than-right degenerative changes. Vascular stent  grafts are noted. IMPRESSION: No acute fracture on single AP pelvic radiograph. Electronically Signed   By: Hart Robinsons M.D.   On: 08/19/2023 13:34   DG Chest Portable 1 View  Result Date: 08/19/2023 CLINICAL DATA:  Fall. EXAM: PORTABLE CHEST 1 VIEW COMPARISON:  Chest radiograph dated December 30, 2021. FINDINGS: The heart size and mediastinal contours are within normal limits. Aortic arch calcification. Similar chronic interstitial parenchymal changes. No focal consolidation, pneumothorax, or sizable pleural effusion. No acute osseous abnormality. Status post right shoulder arthroplasty. IMPRESSION: No acute findings in the chest. Electronically Signed   By: Hart Robinsons M.D.   On: 08/19/2023 13:32   DG Femur Min 2 Views Left  Result Date: 08/19/2023 CLINICAL DATA:  Fall.  Pain. EXAM: LEFT FEMUR 2 VIEWS COMPARISON:  Hip radiographs dated 07/08/2018. FINDINGS: Minimally  displaced oblique fracture at the posteromedial cortex of the distal left femur. No additional fracture identified. Intact left total knee arthroplasty. The left hip joint is anatomically aligned with severe joint space narrowing and marginal osteophytosis. Vascular stents and skin staples are noted. IMPRESSION: 1. Minimally displaced oblique fracture at the posteromedial cortex of the distal left femur. No additional fracture identified. 2. Severe left hip osteoarthritis. Electronically Signed   By: Hart Robinsons M.D.   On: 08/19/2023 13:29   DG Knee Left Port  Result Date: 08/19/2023 CLINICAL DATA:  Fall.  Bilateral knee pain. EXAM: PORTABLE LEFT KNEE - 1-2 VIEW COMPARISON:  None Available. FINDINGS: Diffusely decreased osseous mineralization. There is a minimally displaced oblique fracture at the posteromedial distal left femoral metadiaphyseal cortex. No additional fracture identified. Lipohemarthrosis is noted. Status post left total knee arthroplasty in anatomic alignment. Vascular stents and skin staples are noted. There is soft tissue swelling about the knee and distal femur. IMPRESSION: 1. Minimally displaced oblique fracture at the posteromedial cortex of the distal left femur. 2. Lipohemarthrosis, concerning for possible additional occult fracture. 3. Intact left total knee arthroplasty in anatomic alignment. Electronically Signed   By: Hart Robinsons M.D.   On: 08/19/2023 13:22       The results of significant diagnostics from this hospitalization (including imaging, microbiology, ancillary and laboratory) are listed below for reference.     Microbiology: Recent Results (from the past 240 hour(s))  Urine Culture     Status: Abnormal   Collection Time: 08/19/23  2:58 PM   Specimen: Urine, Catheterized  Result Value Ref Range Status   Specimen Description URINE, CATHETERIZED  Final   Special Requests   Final    NONE Performed at [facility], [address],  [place], [place] [address]    Culture >=100,000 COLONIES/mL KLEBSIELLA PNEUMONIAE (A)  Final   Report Status 08/21/2023 FINAL  Final   Organism ID, Bacteria KLEBSIELLA PNEUMONIAE (A)  Final      Susceptibility   Klebsiella pneumoniae - MIC*    AMPICILLIN RESISTANT Resistant     CEFAZOLIN <=4 SENSITIVE Sensitive     CEFEPIME <=0.12 SENSITIVE Sensitive     CEFTRIAXONE <=0.25 SENSITIVE Sensitive     CIPROFLOXACIN <=0.25 SENSITIVE Sensitive     GENTAMICIN <=1 SENSITIVE Sensitive     IMIPENEM <=0.25 SENSITIVE Sensitive     NITROFURANTOIN <=16 SENSITIVE Sensitive     TRIMETH/SULFA <=20 SENSITIVE Sensitive     AMPICILLIN/SULBACTAM 4 SENSITIVE Sensitive     PIP/TAZO <=4 SENSITIVE Sensitive     * >=100,000 COLONIES/mL KLEBSIELLA PNEUMONIAE     Labs:  CBC: Recent Labs  Lab 08/19/23 1137  08/20/23 0517 08/21/23 0329 08/22/23 1043 08/23/23 0821  WBC 8.9 11.1* 10.7* 9.8 9.9  NEUTROABS 3.9  --   --   --   --   HGB 11.7* 9.9* 9.4* 8.8* 9.0*  HCT 36.5 30.7* 29.9* 26.5* 27.7*  MCV 96.6 95.3 96.8 96.0 97.9  PLT 209 218 197 206 201   BMP &GFR Recent Labs  Lab 08/19/23 1344 08/20/23 0517 08/21/23 0329 08/22/23 1043 08/23/23 0636  NA 135 134* 129* 130* 136  K 4.6 4.6 4.2 3.5 3.9  CL 102 101 98 100 99  CO2 24 25 21* 21* 22  GLUCOSE 127* 127* 120* 153* 111*  BUN 27* 26* 33* 21 23  CREATININE 1.24* 1.40* 1.65* 1.14* 1.06*  CALCIUM 8.9 8.7* 8.4* 8.0* 8.8*  MG  --   --  2.1 1.9 2.1  PHOS  --   --  4.2 2.7 3.0   Estimated Creatinine Clearance: 26.8 mL/min (A) (by C-G formula based on SCr of 1.06 mg/dL (H)). Liver & Pancreas: Recent Labs  Lab 08/21/23 0329 08/22/23 1043 08/23/23 0636  ALBUMIN 3.1* 2.5* 2.6*   No results for input(s): "LIPASE", "AMYLASE" in the last 168 hours. No results for input(s): "AMMONIA" in the last 168 hours. Diabetic: No results for input(s): "HGBA1C" in the last 72 hours. No results for input(s): "GLUCAP" in the last 168 hours. Cardiac Enzymes: No  results for input(s): "CKTOTAL", "CKMB", "CKMBINDEX", "TROPONINI" in the last 168 hours. Recent Labs    05/13/23 1603  PROBNP 331   Coagulation Profile: No results for input(s): "INR", "PROTIME" in the last 168 hours. Thyroid Function Tests: No results for input(s): "TSH", "T4TOTAL", "FREET4", "T3FREE", "THYROIDAB" in the last 72 hours. Lipid Profile: No results for input(s): "CHOL", "HDL", "LDLCALC", "TRIG", "CHOLHDL", "LDLDIRECT" in the last 72 hours. Anemia Panel: No results for input(s): "VITAMINB12", "FOLATE", "FERRITIN", "TIBC", "IRON", "RETICCTPCT" in the last 72 hours. Urine analysis:    Component Value Date/Time   COLORURINE YELLOW 08/19/2023 1313   APPEARANCEUR CLOUDY (A) 08/19/2023 1313   LABSPEC 1.009 08/19/2023 1313   PHURINE 5.0 08/19/2023 1313   GLUCOSEU NEGATIVE 08/19/2023 1313   HGBUR NEGATIVE 08/19/2023 1313   BILIRUBINUR NEGATIVE 08/19/2023 1313   KETONESUR NEGATIVE 08/19/2023 1313   PROTEINUR NEGATIVE 08/19/2023 1313   UROBILINOGEN 0.2 09/04/2013 0130   NITRITE POSITIVE (A) 08/19/2023 1313   LEUKOCYTESUR LARGE (A) 08/19/2023 1313   Sepsis Labs: Invalid input(s): "PROCALCITONIN", "LACTICIDVEN"   SIGNED:  Almon Hercules, MD  [facility] 08/23/2023, 12:17 PM

## 2023-08-23 NOTE — Progress Notes (Signed)
Daily Progress Note   Patient Name: [name]       Date: 08/23/2023 DOB: [birth year]  Age: 89 y.o. MRN#: [id number] Attending Physician: Almon Hercules, MD Primary Care Physician: Marden Noble, MD Admit Date: 08/19/2023  Reason for Consultation/Follow-up: Establishing goals of care  Length of Stay: 4  Current Medications: Scheduled Meds:   acetaminophen  1,000 mg Oral TID   bisacodyl  10 mg Rectal Once   clopidogrel  75 mg Oral Daily   enoxaparin (LOVENOX) injection  30 mg Subcutaneous Q24H   feeding supplement  1 Container Oral TID BM   gabapentin  200 mg Oral TID   levothyroxine  50 mcg Oral Q0600   metoprolol tartrate  25 mg Oral BID   multivitamins with iron  1 tablet Oral Daily   senna  2 tablet Oral QHS    Continuous Infusions:   ceFAZolin (ANCEF) IV 1 g (08/22/23 2101)    PRN Meds: diazepam, HYDROmorphone (DILAUDID) injection, ondansetron (ZOFRAN) IV, oxyCODONE  Physical Exam Vitals reviewed.  Cardiovascular:     Rate and Rhythm: Tachycardia present.  Pulmonary:     Effort: Pulmonary effort is normal.  Abdominal:     Tenderness: There is abdominal tenderness.  Musculoskeletal:     Comments: Right BKA  Skin:    General: Skin is warm and dry.     Findings: Bruising present.  Neurological:     Mental Status: She is alert and oriented to person, place, and time.  Psychiatric:        Mood and Affect: Mood normal.        Behavior: Behavior normal.        Thought Content: Thought content normal.        Judgment: Judgment normal.             Vital Signs: BP (!) 114/49 (BP Location: Left Wrist)   Pulse (!) 109   Temp 97.7 F (36.5 C) (Oral)   Resp 17   Ht 5\' 2"  (1.575 m)   Wt 52.2 kg   SpO2 96%   BMI 21.03 kg/m  SpO2: SpO2: 96 % O2 Device: O2  Device: Room Air O2 Flow Rate:     LBM: Last BM Date : 08/19/23 Baseline Weight: Weight: 52.2 kg Most recent weight: Weight: 52.2 kg  Patient Active Problem List   Diagnosis Date Noted   History of GI bleed 08/19/2023   Femur fracture, left (HCC) 08/19/2023   Chest pain of uncertain etiology 08/14/2021   History of CVA (cerebrovascular accident) 08/14/2021   Thrombosis 05/04/2020   Secondary hypercoagulable state (HCC) 01/09/2020   Critical lower limb ischemia (HCC) 12/28/2019   Plantar fasciitis of left foot 06/10/2017   Idiopathic chronic venous hypertension of both lower extremities with inflammation 05/21/2017   Foot drop, left 05/21/2017   Decubitus ulcer of sacral region, unstageable (HCC)    Deep tissue injury    Hypokalemia    Hypoalbuminemia due to protein-calorie malnutrition (HCC)    Debilitated 01/21/2017   Neuropathic pain    Post-operative pain    Slow transit constipation    Debility    Ischemic leg    PAF (paroxysmal atrial fibrillation) (HCC)    Anemia of chronic disease    Chronic pain syndrome    Fibromyalgia    Stage 3 chronic kidney disease (HCC)    Benign essential HTN    Abnormal urinalysis    PVD (peripheral vascular disease) (HCC) 01/13/2017   Post-op pain 01/03/2017   Phantom limb pain (HCC) 01/03/2017   S/P unilateral BKA (below knee amputation), right (HCC) 12/30/2016   Acute on chronic combined systolic and diastolic CHF (congestive heart failure) (HCC) 12/22/2016   Anemia 12/22/2016   Palliative care encounter    Lower extremity pain, right    Acute GI bleeding 10/23/2016   Paroxysmal atrial fibrillation (HCC) 10/03/2016   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) 09/24/2016   PAD (peripheral artery disease) (HCC) 09/05/2016   Groin pain 04/02/2015   Aftercare following surgery of the circulatory system, NEC 12/13/2013   Shingles 10/26/2013   Anxiety 10/26/2013   Acute blood loss  anemia 09/05/2013   Elbow fracture, left 09/05/2013   Left acetabular fracture (HCC) 09/05/2013   Ankle fracture, left 09/05/2013   HTN (hypertension) 09/03/2013   HLD (hyperlipidemia) 09/03/2013   Peripheral vascular disease, unspecified (HCC) 05/10/2012   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 female  with past medical history of fibromyalgia, right BKA, TIA, a-fib, HFpEF, and anemia admitted on 08/19/2023 for evaluation after a fall when transferring from wheelchair to commode.   Found to have left distal femoral fracture. Orthopedic surgery consulted and recommended conservative management given severe left PAD   Assessment & Today's Discussion: Patient is laying in bed with daughter at bedside. Patient had a better day with regards to pain yesterday. In the last 24 hours she requested zero dilaudid doses and only one oxycodone dose (this am). Continue tylenol 1000 mg every eight hours. She did sleep much of yesterday. She has not had a bowel movement but feels like she might have one soon. She is agreeable to adding a dulcolax suppository. Suppositories have worked for her in the past. Encouraged her to notify team if she wants to be more aggressive with bowel regimen.  Daughter shares that they have rearranged her furniture at [employer].   Encouraged patient and daughter to call PMT with any needs. Will continue to support.  Recommendations/Plan: DNR Limited scope of treatment- DNI Treat the treatable Return to [facility] Pain management: Continue tylenol to 1000 mg tid Constipation: Add dulcolax suppository Continued PMT support   Code Status:    Code Status Orders  (  From admission, onward)           Start     Ordered   08/21/23 1102  Do not attempt resuscitation (DNR)- Limited -Do Not Intubate (DNI)  (Code Status)  Continuous       Question Answer Comment  If pulseless  and not breathing No CPR or chest compressions.   In Pre-Arrest Conditions (Patient Is Breathing and Has A Pulse) Do not intubate. Provide all appropriate non-invasive medical interventions. Avoid ICU transfer unless indicated or required.   Consent: Discussion documented in EHR or advanced directives reviewed      08/21/23 1103          Care plan was discussed with Dr. Alanda Slim  Time spent: 35 minutes  Thank you for allowing the Palliative Medicine Team to assist in the care of this patient.    Sherryll Burger, NP  Please contact Palliative Medicine Team phone at [phone] for questions and concerns.

## 2023-08-23 NOTE — TOC Transition Note (Signed)
Transition of Care [facility]) - CM/SW Discharge Note   Patient Details  Name: [name] MRN: [id number] Date of Birth: 10/21/1928  Transition of Care [facility] CM/SW Contact:  Helene Kelp, LCSW Phone Number: 08/23/2023, 1:45 PM   Clinical Narrative:    CSW was contacted by the clinical-team/provider to support the patient's transport back to the facility [facility],   This writer spoke with the facility liaison Leveda Anna [phone] and obtained nurse report#: [phone] and room [facility] [phone]).  This Clinical research associate updated the clinical team and provider.   This Clinical research associate contacted the patient and natural support  This Clinical research associate arranged transport for the patient through [facility].   Transport date: 08/23/23 Time: 3:30 PM.    Barriers to Discharge: Continued Medical Work up   Patient Goals and CMS Choice Rehab  Discharge Placement SNF  Discharge Plan and Services Additional resources added to the After Visit Summary for   In-house Referral: Clinical Social Work   Post Acute Care Choice: Resumption of Paediatric nurse (from [facility])               Social Determinants of Health (SDOH) Interventions SDOH Screenings   Food Insecurity: No Food Insecurity (08/20/2023)  Housing: Low Risk  (08/20/2023)  Transportation Needs: No Transportation Needs (08/20/2023)  Utilities: Not At Risk (08/20/2023)  Tobacco Use: Medium Risk (07/06/2023)    Readmission Risk Interventions     No data to display

## 2023-08-23 NOTE — Progress Notes (Signed)
Report given to Timikia at [facility]. Patient waiting for EMS to transport. Discharge instructions reviewed with patient and daughter.

## 2023-08-23 NOTE — Plan of Care (Signed)
  Problem: Coping: Goal: Level of anxiety will decrease Outcome: Progressing   Problem: Pain Managment: Goal: General experience of comfort will improve Outcome: Progressing   Problem: Safety: Goal: Ability to remain free from injury will improve Outcome: Progressing   

## 2023-08-24 DIAGNOSIS — I2510 Atherosclerotic heart disease of native coronary artery without angina pectoris: Secondary | ICD-10-CM | POA: Diagnosis not present

## 2023-08-24 DIAGNOSIS — E039 Hypothyroidism, unspecified: Secondary | ICD-10-CM | POA: Diagnosis not present

## 2023-08-24 DIAGNOSIS — I110 Hypertensive heart disease with heart failure: Secondary | ICD-10-CM | POA: Diagnosis not present

## 2023-08-24 DIAGNOSIS — I482 Chronic atrial fibrillation, unspecified: Secondary | ICD-10-CM | POA: Diagnosis not present

## 2023-08-24 DIAGNOSIS — I509 Heart failure, unspecified: Secondary | ICD-10-CM | POA: Diagnosis not present

## 2023-08-24 DIAGNOSIS — Z7902 Long term (current) use of antithrombotics/antiplatelets: Secondary | ICD-10-CM | POA: Diagnosis not present

## 2023-08-31 DIAGNOSIS — I4811 Longstanding persistent atrial fibrillation: Secondary | ICD-10-CM | POA: Diagnosis not present

## 2023-08-31 DIAGNOSIS — I110 Hypertensive heart disease with heart failure: Secondary | ICD-10-CM | POA: Diagnosis not present

## 2023-08-31 DIAGNOSIS — Z89511 Acquired absence of right leg below knee: Secondary | ICD-10-CM | POA: Diagnosis not present

## 2023-08-31 DIAGNOSIS — S72402D Unspecified fracture of lower end of left femur, subsequent encounter for closed fracture with routine healing: Secondary | ICD-10-CM | POA: Diagnosis not present

## 2023-08-31 DIAGNOSIS — Z87448 Personal history of other diseases of urinary system: Secondary | ICD-10-CM | POA: Diagnosis not present

## 2023-08-31 DIAGNOSIS — I5030 Unspecified diastolic (congestive) heart failure: Secondary | ICD-10-CM | POA: Diagnosis not present

## 2023-08-31 DIAGNOSIS — I739 Peripheral vascular disease, unspecified: Secondary | ICD-10-CM | POA: Diagnosis not present

## 2023-08-31 DIAGNOSIS — N183 Chronic kidney disease, stage 3 unspecified: Secondary | ICD-10-CM | POA: Diagnosis not present

## 2023-08-31 DIAGNOSIS — R0989 Other specified symptoms and signs involving the circulatory and respiratory systems: Secondary | ICD-10-CM | POA: Diagnosis not present

## 2023-08-31 DIAGNOSIS — Z9181 History of falling: Secondary | ICD-10-CM | POA: Diagnosis not present

## 2023-08-31 DIAGNOSIS — D631 Anemia in chronic kidney disease: Secondary | ICD-10-CM | POA: Diagnosis not present

## 2023-08-31 DIAGNOSIS — I10 Essential (primary) hypertension: Secondary | ICD-10-CM | POA: Diagnosis not present

## 2023-08-31 DIAGNOSIS — Z7902 Long term (current) use of antithrombotics/antiplatelets: Secondary | ICD-10-CM | POA: Diagnosis not present

## 2023-09-01 DIAGNOSIS — Z89511 Acquired absence of right leg below knee: Secondary | ICD-10-CM | POA: Diagnosis not present

## 2023-09-01 DIAGNOSIS — R296 Repeated falls: Secondary | ICD-10-CM | POA: Diagnosis not present

## 2023-09-01 DIAGNOSIS — R2689 Other abnormalities of gait and mobility: Secondary | ICD-10-CM | POA: Diagnosis not present

## 2023-09-07 DIAGNOSIS — M069 Rheumatoid arthritis, unspecified: Secondary | ICD-10-CM | POA: Diagnosis not present

## 2023-09-07 DIAGNOSIS — L89153 Pressure ulcer of sacral region, stage 3: Secondary | ICD-10-CM | POA: Diagnosis not present

## 2023-09-07 DIAGNOSIS — I5032 Chronic diastolic (congestive) heart failure: Secondary | ICD-10-CM | POA: Diagnosis not present

## 2023-09-07 DIAGNOSIS — I639 Cerebral infarction, unspecified: Secondary | ICD-10-CM | POA: Diagnosis not present

## 2023-09-08 DIAGNOSIS — S72452D Displaced supracondylar fracture without intracondylar extension of lower end of left femur, subsequent encounter for closed fracture with routine healing: Secondary | ICD-10-CM | POA: Diagnosis not present

## 2023-09-09 DIAGNOSIS — R770 Abnormality of albumin: Secondary | ICD-10-CM | POA: Diagnosis not present

## 2023-09-14 DIAGNOSIS — I639 Cerebral infarction, unspecified: Secondary | ICD-10-CM | POA: Diagnosis not present

## 2023-09-14 DIAGNOSIS — I5032 Chronic diastolic (congestive) heart failure: Secondary | ICD-10-CM | POA: Diagnosis not present

## 2023-09-14 DIAGNOSIS — L89153 Pressure ulcer of sacral region, stage 3: Secondary | ICD-10-CM | POA: Diagnosis not present

## 2023-09-14 DIAGNOSIS — M069 Rheumatoid arthritis, unspecified: Secondary | ICD-10-CM | POA: Diagnosis not present

## 2023-09-18 DIAGNOSIS — Z7902 Long term (current) use of antithrombotics/antiplatelets: Secondary | ICD-10-CM | POA: Diagnosis not present

## 2023-09-18 DIAGNOSIS — F419 Anxiety disorder, unspecified: Secondary | ICD-10-CM | POA: Diagnosis not present

## 2023-09-18 DIAGNOSIS — E039 Hypothyroidism, unspecified: Secondary | ICD-10-CM | POA: Diagnosis not present

## 2023-09-18 DIAGNOSIS — S7290XD Unspecified fracture of unspecified femur, subsequent encounter for closed fracture with routine healing: Secondary | ICD-10-CM | POA: Diagnosis not present

## 2023-09-18 DIAGNOSIS — Z8673 Personal history of transient ischemic attack (TIA), and cerebral infarction without residual deficits: Secondary | ICD-10-CM | POA: Diagnosis not present

## 2023-09-18 DIAGNOSIS — Z89511 Acquired absence of right leg below knee: Secondary | ICD-10-CM | POA: Diagnosis not present

## 2023-09-18 DIAGNOSIS — I119 Hypertensive heart disease without heart failure: Secondary | ICD-10-CM | POA: Diagnosis not present

## 2023-09-18 DIAGNOSIS — G8929 Other chronic pain: Secondary | ICD-10-CM | POA: Diagnosis not present

## 2023-09-18 DIAGNOSIS — E7800 Pure hypercholesterolemia, unspecified: Secondary | ICD-10-CM | POA: Diagnosis not present

## 2023-09-18 DIAGNOSIS — Z5941 Food insecurity: Secondary | ICD-10-CM | POA: Diagnosis not present

## 2023-09-21 DIAGNOSIS — M069 Rheumatoid arthritis, unspecified: Secondary | ICD-10-CM | POA: Diagnosis not present

## 2023-09-21 DIAGNOSIS — L89153 Pressure ulcer of sacral region, stage 3: Secondary | ICD-10-CM | POA: Diagnosis not present

## 2023-09-21 DIAGNOSIS — I5032 Chronic diastolic (congestive) heart failure: Secondary | ICD-10-CM | POA: Diagnosis not present

## 2023-09-21 DIAGNOSIS — I639 Cerebral infarction, unspecified: Secondary | ICD-10-CM | POA: Diagnosis not present

## 2023-09-22 DIAGNOSIS — R062 Wheezing: Secondary | ICD-10-CM | POA: Diagnosis not present

## 2023-10-05 DIAGNOSIS — S72452D Displaced supracondylar fracture without intracondylar extension of lower end of left femur, subsequent encounter for closed fracture with routine healing: Secondary | ICD-10-CM | POA: Diagnosis not present

## 2023-10-05 DIAGNOSIS — L89153 Pressure ulcer of sacral region, stage 3: Secondary | ICD-10-CM | POA: Diagnosis not present

## 2023-10-05 DIAGNOSIS — I639 Cerebral infarction, unspecified: Secondary | ICD-10-CM | POA: Diagnosis not present

## 2023-10-05 DIAGNOSIS — M069 Rheumatoid arthritis, unspecified: Secondary | ICD-10-CM | POA: Diagnosis not present

## 2023-10-05 DIAGNOSIS — I5032 Chronic diastolic (congestive) heart failure: Secondary | ICD-10-CM | POA: Diagnosis not present

## 2023-10-12 DIAGNOSIS — L89153 Pressure ulcer of sacral region, stage 3: Secondary | ICD-10-CM | POA: Diagnosis not present

## 2023-10-12 DIAGNOSIS — M069 Rheumatoid arthritis, unspecified: Secondary | ICD-10-CM | POA: Diagnosis not present

## 2023-10-12 DIAGNOSIS — I639 Cerebral infarction, unspecified: Secondary | ICD-10-CM | POA: Diagnosis not present

## 2023-10-12 DIAGNOSIS — I5032 Chronic diastolic (congestive) heart failure: Secondary | ICD-10-CM | POA: Diagnosis not present

## 2023-10-14 DIAGNOSIS — R55 Syncope and collapse: Secondary | ICD-10-CM | POA: Diagnosis not present

## 2023-10-14 DIAGNOSIS — I10 Essential (primary) hypertension: Secondary | ICD-10-CM | POA: Diagnosis not present

## 2023-10-19 DIAGNOSIS — M6281 Muscle weakness (generalized): Secondary | ICD-10-CM | POA: Diagnosis not present

## 2023-10-20 DIAGNOSIS — E7800 Pure hypercholesterolemia, unspecified: Secondary | ICD-10-CM | POA: Diagnosis not present

## 2023-10-21 DIAGNOSIS — M6281 Muscle weakness (generalized): Secondary | ICD-10-CM | POA: Diagnosis not present

## 2023-10-22 ENCOUNTER — Ambulatory Visit: Payer: Medicare Other | Admitting: Cardiovascular Disease

## 2023-11-09 DIAGNOSIS — I10 Essential (primary) hypertension: Secondary | ICD-10-CM | POA: Diagnosis not present

## 2023-11-09 DIAGNOSIS — E039 Hypothyroidism, unspecified: Secondary | ICD-10-CM | POA: Diagnosis not present

## 2023-11-09 DIAGNOSIS — G629 Polyneuropathy, unspecified: Secondary | ICD-10-CM | POA: Diagnosis not present

## 2023-11-09 DIAGNOSIS — I739 Peripheral vascular disease, unspecified: Secondary | ICD-10-CM | POA: Diagnosis not present

## 2023-12-24 ENCOUNTER — Ambulatory Visit: Payer: Medicare Other | Admitting: Physician Assistant

## 2023-12-29 ENCOUNTER — Ambulatory Visit: Payer: Medicare Other | Attending: Cardiology | Admitting: Cardiology

## 2023-12-29 ENCOUNTER — Encounter: Payer: Self-pay | Admitting: Cardiology

## 2023-12-29 VITALS — BP 187/82 | HR 73 | Resp 18 | Ht 62.0 in | Wt 109.0 lb

## 2023-12-29 DIAGNOSIS — Z5309 Procedure and treatment not carried out because of other contraindication: Secondary | ICD-10-CM | POA: Insufficient documentation

## 2023-12-29 DIAGNOSIS — I10 Essential (primary) hypertension: Secondary | ICD-10-CM | POA: Insufficient documentation

## 2023-12-29 DIAGNOSIS — I480 Paroxysmal atrial fibrillation: Secondary | ICD-10-CM | POA: Diagnosis present

## 2023-12-29 DIAGNOSIS — E7800 Pure hypercholesterolemia, unspecified: Secondary | ICD-10-CM | POA: Diagnosis present

## 2023-12-29 DIAGNOSIS — R627 Adult failure to thrive: Secondary | ICD-10-CM | POA: Insufficient documentation

## 2023-12-29 NOTE — Patient Instructions (Signed)
Medication Instructions:  Your physician recommends that you continue on your current medications as directed. Please refer to the Current Medication list given to you today.  *If you need a refill on your cardiac medications before your next appointment, please call your pharmacy*   Lab Work: none If you have labs (blood work) drawn today and your tests are completely normal, you will receive your results only by: MyChart Message (if you have MyChart) OR A paper copy in the mail If you have any lab test that is abnormal or we need to change your treatment, we will call you to review the results.   Testing/Procedures: none   Follow-Up: At [facility], you and your health needs are our priority.  As part of our continuing mission to provide you with exceptional heart care, we have created designated Provider Care Teams.  These Care Teams include your primary Cardiologist (physician) and Advanced Practice Providers (APPs -  Physician Assistants and Nurse Practitioners) who all work together to provide you with the care you need, when you need it.  We recommend signing up for the patient portal called "MyChart".  Sign up information is provided on this After Visit Summary.  MyChart is used to connect with patients for Virtual Visits (Telemedicine).  Patients are able to view lab/test results, encounter notes, upcoming appointments, etc.  Non-urgent messages can be sent to your provider as well.   To learn more about what you can do with MyChart, go to [email or web address].    Your next appointment:   As needed  Provider:   Dr Jacinto Halim    Other Instructions

## 2023-12-29 NOTE — Progress Notes (Signed)
Cardiology Office Note:  .   Date:  12/29/2023  ID:  [name], DOB 19-Oct-1928, MRN [id number] PCP: Marden Noble, MD (Inactive)  [facility] Providers Cardiologist:  Yates Decamp, MD   History of Present Illness: .   [name] is a 89 y.o. Female patient with paroxysmal atrial fibrillation, recurrent lower GI bleed and is felt not to be candidate for anticoagulation, primary hypertension, hypercholesterolemia, PAD SP right BKA in 2018 and femoropopliteal bypass left lower extremity in 2010 and has history of graft thrombectomy in 2011, remote stroke, GERD presents here for 31-month office visit.  Since her recent hospitalization on 08/19/2023 with fall and left distal femoral fracture and recommended conservative therapy and palliative care.  She has had a low risk nuclear stress test on 09/19/2016 revealing no ischemia and normal LVEF and echocardiogram in 21 revealing normal LVEF without significant valvular abnormality.  She is DNR as per her wishes.  Discussed the use of AI scribe software for clinical note transcription with the patient, who gave verbal consent to proceed.  History of Present Illness   The patient, with a history of paroxysmal atrial fibrillation, high blood pressure, high cholesterol, and peripheral arterial disease, presents for a follow-up visit. The patient is not on anticoagulation due to a previous lower GI bleed. The patient had a right pulmonary amputation and a bypass graft on the left side. The patient had a recent fall resulting in a broken left distal femur, which has left her wheelchair-bound as surgeons refused to operate. The patient is a DNR. The patient also has anemia with a recent hemoglobin level of 10. The patient is currently on Plavix for vascular disease.      Labs   Lab Results  Component Value Date   CHOL 190 01/24/2021   HDL 36 (L) 01/24/2021   LDLCALC 98 01/24/2021   LDLDIRECT 226 (H) 04/20/2020   TRIG 335 (H) 01/24/2021    CHOLHDL 5.3 (H) 01/24/2021   Lab Results  Component Value Date   NA 136 08/23/2023   K 3.9 08/23/2023   CO2 22 08/23/2023   GLUCOSE 111 (H) 08/23/2023   BUN 23 08/23/2023   CREATININE 1.06 (H) 08/23/2023   CALCIUM 8.8 (L) 08/23/2023   EGFR 47 (L) 05/13/2023   GFRNONAA 49 (L) 08/23/2023      Latest Ref Rng & Units 08/23/2023    6:36 AM 08/22/2023   10:43 AM 08/21/2023    3:29 AM  BMP  Glucose 70 - 99 mg/dL 409  811  914   BUN 8 - 23 mg/dL 23  21  33   Creatinine 0.44 - 1.00 mg/dL 7.82  9.56  2.13   Sodium 135 - 145 mmol/L 136  130  129   Potassium 3.5 - 5.1 mmol/L 3.9  3.5  4.2   Chloride 98 - 111 mmol/L 99  100  98   CO2 22 - 32 mmol/L 22  21  21    Calcium 8.9 - 10.3 mg/dL 8.8  8.0  8.4       Latest Ref Rng & Units 08/23/2023    8:21 AM 08/22/2023   10:43 AM 08/21/2023    3:29 AM  CBC  WBC 4.0 - 10.5 K/uL 9.9  9.8  10.7   Hemoglobin 12.0 - 15.0 g/dL 9.0  8.8  9.4   Hematocrit 36.0 - 46.0 % 27.7  26.5  29.9   Platelets 150 - 400 K/uL 201  206  197  Review of Systems  Cardiovascular:  Negative for chest pain and dyspnea on exertion.    Physical Exam:   VS:  BP (!) 187/82 (BP Location: Left Arm, Patient Position: Sitting, Cuff Size: Normal)   Pulse 73   Resp 18   Ht 5\' 2"  (1.575 m)   Wt 109 lb (49.4 kg) Comment: Patient reported  SpO2 93%   BMI 19.94 kg/m    Wt Readings from Last 3 Encounters:  12/29/23 109 lb (49.4 kg)  08/19/23 115 lb (52.2 kg)  05/13/23 119 lb (54 kg)    Physical Exam Constitutional:      Appearance: She is underweight. She is ill-appearing. She is not toxic-appearing.  Neck:     Vascular: No carotid bruit or JVD.  Cardiovascular:     Rate and Rhythm: Normal rate and regular rhythm.     Pulses: Intact distal pulses.           Right popliteal pulse not accessible.       Dorsalis pedis pulses are 0 on the left side.       Posterior tibial pulses are 0 on the left side.     Heart sounds: Normal heart sounds. No murmur heard.    No  gallop.  Pulmonary:     Effort: Pulmonary effort is normal.     Breath sounds: Normal breath sounds.  Abdominal:     General: Bowel sounds are normal.     Palpations: Abdomen is soft.  Musculoskeletal:        General: Deformity (BKA) present.     Left lower leg: No edema.    Studies Reviewed: .    NA EKG:    Medications and allergies    Allergies  Allergen Reactions   Motrin [Ibuprofen] Other (See Comments)    ADVERSE REACTION - GI BLEED   Statins Other (See Comments)    ADVERSE REACTION MUSCLE PAIN & WEAKNESS   Amitriptyline Other (See Comments)    Unknown reaction per daughter, unable to tolerate   Augmentin [Amoxicillin-Pot Clavulanate] Diarrhea   Doxycycline Diarrhea   Eliquis [Apixaban] Other (See Comments)    Lower GI bleeding   Ezetimibe Other (See Comments)   Lisinopril Cough   Lisinopril-Hydrochlorothiazide     Other reaction(s): felt "bad" on   Niacin And Related Other (See Comments)   Nitrofurantoin Other (See Comments)   Paxil [Paroxetine] Other (See Comments)   Morphine And Codeine Other (See Comments)    HALLUCINATIONS REACTION IS SIDE EFFECT   Promethazine Hcl Other (See Comments)    IV causes confusion, oral better   Sulfa Antibiotics Nausea And Vomiting     Current Outpatient Medications:    Alirocumab (PRALUENT) 150 MG/ML SOAJ, Inject 1ml (150 mg total) into the skin every 14 days. (Patient taking differently: Inject 150 mg into the skin every 14 (fourteen) days.), Disp: 2 mL, Rfl: 11   clopidogrel (PLAVIX) 75 MG tablet, Take 1 tablet (75 mg total) by mouth daily., Disp: 30 tablet, Rfl: 1   Cyanocobalamin (VITAMIN B-12 PO), Place 1 tablet under the tongue daily., Disp: , Rfl:    diazepam (VALIUM) 5 MG tablet, Take 0.5 tablets (2.5 mg total) by mouth at bedtime as needed for anxiety., Disp: 20 tablet, Rfl: 0   gabapentin (NEURONTIN) 300 MG capsule, Take 300 mg by mouth See admin instructions. Take 300mg  by mouth three times daily.  May take an  additional 300mg  at bedtime as needed for pain., Disp: , Rfl:    levothyroxine (  SYNTHROID) 50 MCG tablet, Take 50 mcg by mouth every morning., Disp: , Rfl:    loperamide (IMODIUM) 2 MG capsule, Take 2 mg by mouth See admin instructions. Take 2mg  by mouth as needed after each loose stool.  Do not exceed 8 capsules in 24 hours., Disp: , Rfl:    metoprolol succinate (TOPROL XL) 25 MG 24 hr tablet, Take 1 tablet (25 mg total) by mouth daily., Disp: 30 tablet, Rfl: 11   Multiple Vitamins-Minerals (PRESERVISION AREDS PO), Take 1 capsule by mouth in the morning and at bedtime., Disp: , Rfl:    oxyCODONE (OXY IR/ROXICODONE) 5 MG immediate release tablet, Take 1 tablet (5 mg total) by mouth every 8 (eight) hours as needed for severe pain., Disp: 30 tablet, Rfl: 0   senna-docusate (SENOKOT-S) 8.6-50 MG tablet, Take 2 tablets by mouth 2 (two) times daily between meals as needed for mild constipation., Disp: , Rfl:    valsartan-hydrochlorothiazide (DIOVAN-HCT) 160-12.5 MG tablet, Take 0.5 tablets by mouth every morning., Disp: , Rfl:    VITAMIN D PO, Take 1,000 Units by mouth daily., Disp: , Rfl:    ASSESSMENT AND PLAN: .      ICD-10-CM   1. Paroxysmal atrial fibrillation (HCC)  I48.0     2. Contraindication to anticoagulation therapy  Z53.09     3. Essential hypertension  I10     4. Pure hypercholesterolemia  E78.00     5. Failure to thrive in adult  R62.7 Do not attempt resuscitation (DNR)     Assessment and Plan    Paroxysmal Atrial Fibrillation   Managed with metoprolol succinate. Not on anticoagulation due to a lower GI bleed. Stroke risk without anticoagulation was discussed, along with the benefits of avoiding further GI bleeding. Continue metoprolol succinate.  Peripheral Arterial Disease   Significant blockage with right pulmonary amputation and left side bypass graft. Plavix is used to prevent further complications. Discussed bleeding risk with Plavix and the potential need to stop if  hemoglobin drops significantly. Benefits include preventing further vascular complications and potential limb loss. Continue Plavix unless hemoglobin drops significantly. Monitor hemoglobin levels closely.  Hypertension   Managed with metoprolol and valsartan HCT. Elevated blood pressure today is likely due to visit stress. The importance of blood pressure control to prevent cardiovascular events was discussed. Continue metoprolol and valsartan HCT.  Hyperlipidemia   Managed with Praluent every two weeks. Discussed benefits of Praluent in reducing LDL levels and cardiovascular risk. Continue Praluent every two weeks.  Anemia   Hemoglobin historically around 12, recently dropped to 9.0 in September. Discussed risks of severe anemia, including fatigue and cardiovascular strain, and the need to monitor hemoglobin levels closely. Check recent hemoglobin levels. Consider stopping Plavix if hemoglobin drops significantly.  Goals of Care   A DNR order is in place. Discussed preference for no resuscitation in the event of cardiac arrest, reflecting her values and understanding of prognosis. Respect DNR order.  Follow-up   Follow-up as needed. Contact if any changes or concerns arise.      Signed,  Yates Decamp, MD, [facility] 12/29/2023, 5:25 PM [facility] [address] [place], [place] [address] Phone: [phone]. Fax:  [phone]

## 2024-01-20 ENCOUNTER — Other Ambulatory Visit (HOSPITAL_COMMUNITY): Payer: Self-pay

## 2024-01-21 ENCOUNTER — Other Ambulatory Visit (HOSPITAL_COMMUNITY): Payer: Self-pay

## 2024-01-21 ENCOUNTER — Telehealth: Payer: Self-pay | Admitting: Pharmacy Technician

## 2024-01-21 NOTE — Telephone Encounter (Signed)
 Pharmacy Patient Advocate Encounter  Received notification from [facility] that Prior Authorization for Praluent has been APPROVED from 01/21/24 to 11/16/24. Unable to obtain price due to refill too soon rejection, last fill date 01/11/24 next available fill date03/17/25   PA #/Case ID/Reference #: [id number]

## 2024-01-21 NOTE — Telephone Encounter (Signed)
 Pharmacy Patient Advocate Encounter   Received notification from CoverMyMeds that prior authorization for Praluent is required/requested.   Insurance verification completed.   The patient is insured through [employer] .   Per test claim: PA required; PA submitted to above mentioned insurance via CoverMyMeds Key/confirmation #/EOC [id number] Status is pending
